# Patient Record
Sex: Female | Born: 1940
Health system: Southern US, Community
[De-identification: ages and names within clinical notes are randomized; demographics above are authoritative.]

## PROBLEM LIST (undated history)

## (undated) DIAGNOSIS — C539 Malignant neoplasm of cervix uteri, unspecified: Secondary | ICD-10-CM

## (undated) DIAGNOSIS — Z923 Personal history of irradiation: Secondary | ICD-10-CM

## (undated) DIAGNOSIS — T7840XA Allergy, unspecified, initial encounter: Secondary | ICD-10-CM

## (undated) DIAGNOSIS — C50919 Malignant neoplasm of unspecified site of unspecified female breast: Secondary | ICD-10-CM

## (undated) DIAGNOSIS — I219 Acute myocardial infarction, unspecified: Secondary | ICD-10-CM

## (undated) DIAGNOSIS — E119 Type 2 diabetes mellitus without complications: Secondary | ICD-10-CM

## (undated) DIAGNOSIS — H269 Unspecified cataract: Secondary | ICD-10-CM

## (undated) DIAGNOSIS — Z8541 Personal history of malignant neoplasm of cervix uteri: Secondary | ICD-10-CM

## (undated) DIAGNOSIS — E785 Hyperlipidemia, unspecified: Secondary | ICD-10-CM

## (undated) DIAGNOSIS — D34 Benign neoplasm of thyroid gland: Secondary | ICD-10-CM

## (undated) DIAGNOSIS — J810 Acute pulmonary edema: Secondary | ICD-10-CM

## (undated) DIAGNOSIS — I10 Essential (primary) hypertension: Secondary | ICD-10-CM

## (undated) DIAGNOSIS — J9601 Acute respiratory failure with hypoxia: Secondary | ICD-10-CM

## (undated) DIAGNOSIS — F32A Depression, unspecified: Secondary | ICD-10-CM

## (undated) DIAGNOSIS — M1990 Unspecified osteoarthritis, unspecified site: Secondary | ICD-10-CM

## (undated) DIAGNOSIS — J869 Pyothorax without fistula: Secondary | ICD-10-CM

## (undated) DIAGNOSIS — F329 Major depressive disorder, single episode, unspecified: Secondary | ICD-10-CM

## (undated) DIAGNOSIS — Z9889 Other specified postprocedural states: Secondary | ICD-10-CM

## (undated) DIAGNOSIS — J948 Other specified pleural conditions: Secondary | ICD-10-CM

## (undated) HISTORY — DX: Unspecified osteoarthritis, unspecified site: M19.90

## (undated) HISTORY — PX: STENT PLACEMENT VASCULAR (ARMC HX): HXRAD1737

## (undated) HISTORY — PX: POLYPECTOMY: SHX149

## (undated) HISTORY — DX: Type 2 diabetes mellitus without complications: E11.9

## (undated) HISTORY — DX: Hyperlipidemia, unspecified: E78.5

## (undated) HISTORY — DX: Benign neoplasm of thyroid gland: D34

## (undated) HISTORY — DX: Malignant neoplasm of unspecified site of unspecified female breast: C50.919

## (undated) HISTORY — PX: OTHER SURGICAL HISTORY: SHX169

## (undated) HISTORY — DX: Unspecified cataract: H26.9

## (undated) HISTORY — DX: Acute respiratory failure with hypoxia: J96.01

## (undated) HISTORY — DX: Essential (primary) hypertension: I10

## (undated) HISTORY — DX: Malignant neoplasm of cervix uteri, unspecified: C53.9

## (undated) HISTORY — DX: Allergy, unspecified, initial encounter: T78.40XA

## (undated) HISTORY — DX: Acute pulmonary edema: J81.0

## (undated) HISTORY — DX: Depression, unspecified: F32.A

## (undated) HISTORY — DX: Acute myocardial infarction, unspecified: I21.9

## (undated) HISTORY — PX: COLONOSCOPY: SHX174

---
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
## 1978-10-13 HISTORY — PX: CERVIX SURGERY: SHX593

## 1998-02-06 ENCOUNTER — Encounter: Admission: RE | Admit: 1998-02-06 | Discharge: 1998-02-06 | Payer: Self-pay | Admitting: Family Medicine

## 1998-08-08 ENCOUNTER — Encounter: Admission: RE | Admit: 1998-08-08 | Discharge: 1998-08-08 | Payer: Self-pay | Admitting: Family Medicine

## 1998-08-21 ENCOUNTER — Encounter: Admission: RE | Admit: 1998-08-21 | Discharge: 1998-08-21 | Payer: Self-pay | Admitting: Family Medicine

## 1998-09-26 ENCOUNTER — Other Ambulatory Visit: Admission: RE | Admit: 1998-09-26 | Discharge: 1998-09-26 | Payer: Self-pay | Admitting: *Deleted

## 1998-09-26 ENCOUNTER — Encounter: Admission: RE | Admit: 1998-09-26 | Discharge: 1998-09-26 | Payer: Self-pay | Admitting: Family Medicine

## 1998-10-16 ENCOUNTER — Ambulatory Visit (HOSPITAL_COMMUNITY): Admission: RE | Admit: 1998-10-16 | Discharge: 1998-10-16 | Payer: Self-pay

## 1998-11-14 ENCOUNTER — Encounter: Admission: RE | Admit: 1998-11-14 | Discharge: 1998-11-14 | Payer: Self-pay | Admitting: Family Medicine

## 1999-02-05 ENCOUNTER — Encounter: Admission: RE | Admit: 1999-02-05 | Discharge: 1999-02-05 | Payer: Self-pay | Admitting: Sports Medicine

## 1999-02-20 ENCOUNTER — Encounter: Admission: RE | Admit: 1999-02-20 | Discharge: 1999-02-20 | Payer: Self-pay | Admitting: Family Medicine

## 1999-04-30 ENCOUNTER — Ambulatory Visit (HOSPITAL_COMMUNITY): Admission: RE | Admit: 1999-04-30 | Discharge: 1999-04-30 | Payer: Self-pay | Admitting: Sports Medicine

## 1999-04-30 ENCOUNTER — Encounter: Admission: RE | Admit: 1999-04-30 | Discharge: 1999-04-30 | Payer: Self-pay | Admitting: Sports Medicine

## 1999-05-14 ENCOUNTER — Encounter: Admission: RE | Admit: 1999-05-14 | Discharge: 1999-05-14 | Payer: Self-pay | Admitting: Family Medicine

## 1999-10-22 ENCOUNTER — Encounter: Admission: RE | Admit: 1999-10-22 | Discharge: 1999-10-22 | Payer: Self-pay | Admitting: Sports Medicine

## 1999-10-31 ENCOUNTER — Encounter: Admission: RE | Admit: 1999-10-31 | Discharge: 1999-10-31 | Payer: Self-pay | Admitting: Family Medicine

## 1999-11-05 ENCOUNTER — Ambulatory Visit (HOSPITAL_COMMUNITY): Admission: RE | Admit: 1999-11-05 | Discharge: 1999-11-05 | Payer: Self-pay | Admitting: *Deleted

## 1999-11-05 ENCOUNTER — Encounter: Admission: RE | Admit: 1999-11-05 | Discharge: 1999-11-05 | Payer: Self-pay | Admitting: Sports Medicine

## 1999-11-07 ENCOUNTER — Ambulatory Visit (HOSPITAL_COMMUNITY): Admission: RE | Admit: 1999-11-07 | Discharge: 1999-11-07 | Payer: Self-pay | Admitting: *Deleted

## 1999-11-12 ENCOUNTER — Encounter: Admission: RE | Admit: 1999-11-12 | Discharge: 1999-11-12 | Payer: Self-pay | Admitting: Sports Medicine

## 1999-12-03 ENCOUNTER — Encounter: Admission: RE | Admit: 1999-12-03 | Discharge: 2000-03-02 | Payer: Self-pay | Admitting: *Deleted

## 1999-12-11 ENCOUNTER — Encounter: Admission: RE | Admit: 1999-12-11 | Discharge: 1999-12-11 | Payer: Self-pay | Admitting: Family Medicine

## 2000-01-01 ENCOUNTER — Encounter: Admission: RE | Admit: 2000-01-01 | Discharge: 2000-01-01 | Payer: Self-pay | Admitting: Family Medicine

## 2000-01-15 ENCOUNTER — Encounter: Admission: RE | Admit: 2000-01-15 | Discharge: 2000-01-15 | Payer: Self-pay | Admitting: Family Medicine

## 2000-01-23 ENCOUNTER — Encounter: Admission: RE | Admit: 2000-01-23 | Discharge: 2000-01-23 | Payer: Self-pay | Admitting: Family Medicine

## 2000-02-12 ENCOUNTER — Encounter: Admission: RE | Admit: 2000-02-12 | Discharge: 2000-02-12 | Payer: Self-pay | Admitting: Family Medicine

## 2000-04-02 ENCOUNTER — Ambulatory Visit (HOSPITAL_COMMUNITY): Admission: RE | Admit: 2000-04-02 | Discharge: 2000-04-02 | Payer: Self-pay | Admitting: Family Medicine

## 2000-04-07 ENCOUNTER — Encounter: Admission: RE | Admit: 2000-04-07 | Discharge: 2000-07-06 | Payer: Self-pay | Admitting: *Deleted

## 2000-05-12 ENCOUNTER — Encounter: Admission: RE | Admit: 2000-05-12 | Discharge: 2000-05-12 | Payer: Self-pay | Admitting: Family Medicine

## 2000-06-30 ENCOUNTER — Encounter: Admission: RE | Admit: 2000-06-30 | Discharge: 2000-06-30 | Payer: Self-pay | Admitting: Family Medicine

## 2000-09-09 ENCOUNTER — Encounter: Admission: RE | Admit: 2000-09-09 | Discharge: 2000-09-09 | Payer: Self-pay | Admitting: Family Medicine

## 2000-10-14 ENCOUNTER — Encounter: Admission: RE | Admit: 2000-10-14 | Discharge: 2000-10-14 | Payer: Self-pay | Admitting: Family Medicine

## 2001-01-19 ENCOUNTER — Encounter: Admission: RE | Admit: 2001-01-19 | Discharge: 2001-01-19 | Payer: Self-pay | Admitting: Family Medicine

## 2001-01-20 ENCOUNTER — Encounter: Admission: RE | Admit: 2001-01-20 | Discharge: 2001-01-20 | Payer: Self-pay | Admitting: Family Medicine

## 2001-02-09 ENCOUNTER — Encounter: Payer: Self-pay | Admitting: Sports Medicine

## 2001-02-09 ENCOUNTER — Encounter: Admission: RE | Admit: 2001-02-09 | Discharge: 2001-02-09 | Payer: Self-pay | Admitting: Sports Medicine

## 2001-02-16 ENCOUNTER — Encounter: Admission: RE | Admit: 2001-02-16 | Discharge: 2001-02-16 | Payer: Self-pay | Admitting: Sports Medicine

## 2001-02-16 ENCOUNTER — Encounter: Payer: Self-pay | Admitting: Sports Medicine

## 2001-03-26 ENCOUNTER — Encounter: Admission: RE | Admit: 2001-03-26 | Discharge: 2001-03-26 | Payer: Self-pay | Admitting: Family Medicine

## 2001-04-26 ENCOUNTER — Encounter: Admission: RE | Admit: 2001-04-26 | Discharge: 2001-04-26 | Payer: Self-pay | Admitting: Family Medicine

## 2001-05-28 ENCOUNTER — Encounter: Admission: RE | Admit: 2001-05-28 | Discharge: 2001-05-28 | Payer: Self-pay | Admitting: Family Medicine

## 2001-08-18 ENCOUNTER — Encounter: Admission: RE | Admit: 2001-08-18 | Discharge: 2001-08-18 | Payer: Self-pay | Admitting: Family Medicine

## 2001-08-31 ENCOUNTER — Encounter: Admission: RE | Admit: 2001-08-31 | Discharge: 2001-08-31 | Payer: Self-pay | Admitting: Family Medicine

## 2001-11-30 ENCOUNTER — Encounter: Admission: RE | Admit: 2001-11-30 | Discharge: 2001-11-30 | Payer: Self-pay | Admitting: Family Medicine

## 2001-11-30 ENCOUNTER — Encounter (INDEPENDENT_AMBULATORY_CARE_PROVIDER_SITE_OTHER): Payer: Self-pay | Admitting: *Deleted

## 2001-12-22 ENCOUNTER — Ambulatory Visit (HOSPITAL_COMMUNITY): Admission: RE | Admit: 2001-12-22 | Discharge: 2001-12-22 | Payer: Self-pay | Admitting: Family Medicine

## 2001-12-22 ENCOUNTER — Encounter: Admission: RE | Admit: 2001-12-22 | Discharge: 2001-12-22 | Payer: Self-pay | Admitting: Family Medicine

## 2002-01-11 ENCOUNTER — Ambulatory Visit (HOSPITAL_COMMUNITY): Admission: RE | Admit: 2002-01-11 | Discharge: 2002-01-11 | Payer: Self-pay | Admitting: Sports Medicine

## 2002-01-26 ENCOUNTER — Encounter: Admission: RE | Admit: 2002-01-26 | Discharge: 2002-01-26 | Payer: Self-pay | Admitting: Family Medicine

## 2002-01-27 ENCOUNTER — Ambulatory Visit (HOSPITAL_COMMUNITY): Admission: RE | Admit: 2002-01-27 | Discharge: 2002-01-27 | Payer: Self-pay | Admitting: Family Medicine

## 2002-02-01 ENCOUNTER — Encounter: Admission: RE | Admit: 2002-02-01 | Discharge: 2002-02-01 | Payer: Self-pay | Admitting: Sports Medicine

## 2002-02-18 ENCOUNTER — Encounter: Admission: RE | Admit: 2002-02-18 | Discharge: 2002-02-18 | Payer: Self-pay | Admitting: Family Medicine

## 2002-03-08 ENCOUNTER — Encounter: Payer: Self-pay | Admitting: Sports Medicine

## 2002-03-08 ENCOUNTER — Encounter: Admission: RE | Admit: 2002-03-08 | Discharge: 2002-03-08 | Payer: Self-pay | Admitting: Sports Medicine

## 2002-03-22 ENCOUNTER — Encounter: Admission: RE | Admit: 2002-03-22 | Discharge: 2002-03-22 | Payer: Self-pay | Admitting: Family Medicine

## 2002-04-13 ENCOUNTER — Encounter: Admission: RE | Admit: 2002-04-13 | Discharge: 2002-04-13 | Payer: Self-pay | Admitting: Family Medicine

## 2002-05-03 ENCOUNTER — Encounter: Admission: RE | Admit: 2002-05-03 | Discharge: 2002-05-03 | Payer: Self-pay | Admitting: Family Medicine

## 2002-05-30 ENCOUNTER — Encounter: Admission: RE | Admit: 2002-05-30 | Discharge: 2002-05-30 | Payer: Self-pay | Admitting: Family Medicine

## 2002-06-14 ENCOUNTER — Encounter: Admission: RE | Admit: 2002-06-14 | Discharge: 2002-06-14 | Payer: Self-pay | Admitting: Family Medicine

## 2002-07-07 ENCOUNTER — Encounter: Admission: RE | Admit: 2002-07-07 | Discharge: 2002-07-07 | Payer: Self-pay | Admitting: Family Medicine

## 2002-08-23 ENCOUNTER — Encounter: Admission: RE | Admit: 2002-08-23 | Discharge: 2002-08-23 | Payer: Self-pay | Admitting: Family Medicine

## 2002-10-13 DIAGNOSIS — I219 Acute myocardial infarction, unspecified: Secondary | ICD-10-CM

## 2002-10-13 HISTORY — DX: Acute myocardial infarction, unspecified: I21.9

## 2002-11-15 ENCOUNTER — Encounter: Admission: RE | Admit: 2002-11-15 | Discharge: 2002-11-15 | Payer: Self-pay | Admitting: Family Medicine

## 2002-11-24 ENCOUNTER — Inpatient Hospital Stay (HOSPITAL_COMMUNITY): Admission: EM | Admit: 2002-11-24 | Discharge: 2002-11-30 | Payer: Self-pay | Admitting: Emergency Medicine

## 2002-11-24 ENCOUNTER — Encounter: Payer: Self-pay | Admitting: Emergency Medicine

## 2002-11-25 ENCOUNTER — Encounter: Payer: Self-pay | Admitting: Cardiology

## 2002-11-28 ENCOUNTER — Encounter: Payer: Self-pay | Admitting: Cardiology

## 2002-12-21 ENCOUNTER — Encounter: Admission: RE | Admit: 2002-12-21 | Discharge: 2002-12-21 | Payer: Self-pay | Admitting: Family Medicine

## 2003-01-23 ENCOUNTER — Encounter: Admission: RE | Admit: 2003-01-23 | Discharge: 2003-01-23 | Payer: Self-pay | Admitting: Family Medicine

## 2003-01-23 ENCOUNTER — Encounter (INDEPENDENT_AMBULATORY_CARE_PROVIDER_SITE_OTHER): Payer: Self-pay | Admitting: *Deleted

## 2003-01-30 ENCOUNTER — Encounter (HOSPITAL_COMMUNITY): Admission: RE | Admit: 2003-01-30 | Discharge: 2003-04-30 | Payer: Self-pay | Admitting: Sports Medicine

## 2003-01-31 ENCOUNTER — Encounter: Payer: Self-pay | Admitting: Sports Medicine

## 2003-02-15 ENCOUNTER — Ambulatory Visit (HOSPITAL_COMMUNITY): Admission: RE | Admit: 2003-02-15 | Discharge: 2003-02-15 | Payer: Self-pay | Admitting: General Surgery

## 2003-02-15 ENCOUNTER — Encounter: Payer: Self-pay | Admitting: General Surgery

## 2003-02-28 ENCOUNTER — Encounter (INDEPENDENT_AMBULATORY_CARE_PROVIDER_SITE_OTHER): Payer: Self-pay | Admitting: Specialist

## 2003-02-28 ENCOUNTER — Encounter: Payer: Self-pay | Admitting: General Surgery

## 2003-02-28 ENCOUNTER — Ambulatory Visit (HOSPITAL_COMMUNITY): Admission: RE | Admit: 2003-02-28 | Discharge: 2003-02-28 | Payer: Self-pay | Admitting: General Surgery

## 2003-03-14 ENCOUNTER — Encounter: Admission: RE | Admit: 2003-03-14 | Discharge: 2003-03-14 | Payer: Self-pay | Admitting: Sports Medicine

## 2003-03-14 ENCOUNTER — Encounter: Payer: Self-pay | Admitting: Sports Medicine

## 2003-03-14 DIAGNOSIS — D34 Benign neoplasm of thyroid gland: Secondary | ICD-10-CM

## 2003-03-14 HISTORY — PX: THYROIDECTOMY: SHX17

## 2003-03-14 HISTORY — DX: Benign neoplasm of thyroid gland: D34

## 2003-03-31 ENCOUNTER — Encounter: Payer: Self-pay | Admitting: General Surgery

## 2003-04-07 ENCOUNTER — Observation Stay (HOSPITAL_COMMUNITY): Admission: RE | Admit: 2003-04-07 | Discharge: 2003-04-08 | Payer: Self-pay | Admitting: General Surgery

## 2003-04-07 ENCOUNTER — Encounter (INDEPENDENT_AMBULATORY_CARE_PROVIDER_SITE_OTHER): Payer: Self-pay | Admitting: *Deleted

## 2003-04-13 ENCOUNTER — Encounter: Admission: RE | Admit: 2003-04-13 | Discharge: 2003-04-13 | Payer: Self-pay | Admitting: Family Medicine

## 2003-04-21 ENCOUNTER — Encounter: Admission: RE | Admit: 2003-04-21 | Discharge: 2003-04-21 | Payer: Self-pay | Admitting: Sports Medicine

## 2003-06-01 ENCOUNTER — Encounter: Admission: RE | Admit: 2003-06-01 | Discharge: 2003-06-01 | Payer: Self-pay | Admitting: Family Medicine

## 2003-06-23 ENCOUNTER — Encounter: Admission: RE | Admit: 2003-06-23 | Discharge: 2003-06-23 | Payer: Self-pay | Admitting: Family Medicine

## 2003-06-26 ENCOUNTER — Encounter: Admission: RE | Admit: 2003-06-26 | Discharge: 2003-06-26 | Payer: Self-pay | Admitting: Sports Medicine

## 2003-06-26 ENCOUNTER — Encounter: Payer: Self-pay | Admitting: Sports Medicine

## 2003-07-27 ENCOUNTER — Encounter: Admission: RE | Admit: 2003-07-27 | Discharge: 2003-07-27 | Payer: Self-pay | Admitting: Sports Medicine

## 2003-08-01 ENCOUNTER — Encounter: Admission: RE | Admit: 2003-08-01 | Discharge: 2003-08-01 | Payer: Self-pay | Admitting: Family Medicine

## 2003-08-03 ENCOUNTER — Encounter: Admission: RE | Admit: 2003-08-03 | Discharge: 2003-08-03 | Payer: Self-pay | Admitting: Sports Medicine

## 2004-02-20 ENCOUNTER — Encounter: Admission: RE | Admit: 2004-02-20 | Discharge: 2004-02-20 | Payer: Self-pay | Admitting: Sports Medicine

## 2004-04-02 ENCOUNTER — Encounter (INDEPENDENT_AMBULATORY_CARE_PROVIDER_SITE_OTHER): Payer: Self-pay | Admitting: *Deleted

## 2004-04-02 ENCOUNTER — Encounter: Admission: RE | Admit: 2004-04-02 | Discharge: 2004-04-02 | Payer: Self-pay | Admitting: Family Medicine

## 2004-04-04 ENCOUNTER — Encounter: Admission: RE | Admit: 2004-04-04 | Discharge: 2004-04-04 | Payer: Self-pay | Admitting: Sports Medicine

## 2004-07-09 ENCOUNTER — Ambulatory Visit: Payer: Self-pay | Admitting: Family Medicine

## 2004-07-30 ENCOUNTER — Ambulatory Visit: Payer: Self-pay | Admitting: Family Medicine

## 2004-08-20 ENCOUNTER — Ambulatory Visit (HOSPITAL_COMMUNITY): Admission: RE | Admit: 2004-08-20 | Discharge: 2004-08-20 | Payer: Self-pay | Admitting: Cardiology

## 2004-10-01 ENCOUNTER — Ambulatory Visit: Payer: Self-pay | Admitting: Family Medicine

## 2004-12-16 ENCOUNTER — Ambulatory Visit: Payer: Self-pay | Admitting: Sports Medicine

## 2005-04-11 ENCOUNTER — Ambulatory Visit: Payer: Self-pay | Admitting: Family Medicine

## 2005-04-21 ENCOUNTER — Ambulatory Visit: Payer: Self-pay | Admitting: Family Medicine

## 2005-04-29 ENCOUNTER — Ambulatory Visit: Payer: Self-pay | Admitting: Family Medicine

## 2005-08-01 ENCOUNTER — Ambulatory Visit: Payer: Self-pay | Admitting: Family Medicine

## 2005-12-02 ENCOUNTER — Ambulatory Visit: Payer: Self-pay | Admitting: Sports Medicine

## 2005-12-08 ENCOUNTER — Ambulatory Visit: Payer: Self-pay | Admitting: Family Medicine

## 2005-12-17 ENCOUNTER — Encounter: Admission: RE | Admit: 2005-12-17 | Discharge: 2005-12-17 | Payer: Self-pay | Admitting: Sports Medicine

## 2006-02-16 ENCOUNTER — Encounter (INDEPENDENT_AMBULATORY_CARE_PROVIDER_SITE_OTHER): Payer: Self-pay | Admitting: *Deleted

## 2006-03-02 ENCOUNTER — Ambulatory Visit: Payer: Self-pay | Admitting: Sports Medicine

## 2006-03-12 ENCOUNTER — Ambulatory Visit: Payer: Self-pay | Admitting: Family Medicine

## 2006-04-02 ENCOUNTER — Ambulatory Visit: Payer: Self-pay | Admitting: Family Medicine

## 2006-05-28 ENCOUNTER — Ambulatory Visit: Payer: Self-pay | Admitting: Family Medicine

## 2006-06-25 ENCOUNTER — Ambulatory Visit: Payer: Self-pay | Admitting: Family Medicine

## 2006-06-29 ENCOUNTER — Ambulatory Visit: Payer: Self-pay | Admitting: Sports Medicine

## 2006-07-20 ENCOUNTER — Ambulatory Visit: Payer: Self-pay | Admitting: Family Medicine

## 2006-07-23 ENCOUNTER — Ambulatory Visit: Payer: Self-pay | Admitting: Family Medicine

## 2006-12-10 DIAGNOSIS — M899 Disorder of bone, unspecified: Secondary | ICD-10-CM

## 2006-12-10 DIAGNOSIS — J309 Allergic rhinitis, unspecified: Secondary | ICD-10-CM | POA: Insufficient documentation

## 2006-12-10 DIAGNOSIS — N3946 Mixed incontinence: Secondary | ICD-10-CM | POA: Insufficient documentation

## 2006-12-10 DIAGNOSIS — M8580 Other specified disorders of bone density and structure, unspecified site: Secondary | ICD-10-CM

## 2006-12-10 DIAGNOSIS — K5730 Diverticulosis of large intestine without perforation or abscess without bleeding: Secondary | ICD-10-CM | POA: Insufficient documentation

## 2006-12-10 DIAGNOSIS — M949 Disorder of cartilage, unspecified: Secondary | ICD-10-CM

## 2006-12-10 HISTORY — DX: Other specified disorders of bone density and structure, unspecified site: M85.80

## 2006-12-11 ENCOUNTER — Encounter (INDEPENDENT_AMBULATORY_CARE_PROVIDER_SITE_OTHER): Payer: Self-pay | Admitting: *Deleted

## 2007-01-12 ENCOUNTER — Encounter: Admission: RE | Admit: 2007-01-12 | Discharge: 2007-01-12 | Payer: Self-pay | Admitting: Internal Medicine

## 2007-01-20 DIAGNOSIS — E785 Hyperlipidemia, unspecified: Secondary | ICD-10-CM | POA: Insufficient documentation

## 2007-01-20 DIAGNOSIS — E119 Type 2 diabetes mellitus without complications: Secondary | ICD-10-CM | POA: Insufficient documentation

## 2007-01-20 DIAGNOSIS — I10 Essential (primary) hypertension: Secondary | ICD-10-CM | POA: Insufficient documentation

## 2007-01-20 DIAGNOSIS — I2510 Atherosclerotic heart disease of native coronary artery without angina pectoris: Secondary | ICD-10-CM | POA: Insufficient documentation

## 2007-01-22 ENCOUNTER — Ambulatory Visit: Payer: Self-pay | Admitting: Family Medicine

## 2007-01-22 LAB — CONVERTED CEMR LAB: Hgb A1c MFr Bld: 6.5 %

## 2007-02-23 ENCOUNTER — Ambulatory Visit: Payer: Self-pay | Admitting: Family Medicine

## 2007-02-24 ENCOUNTER — Encounter (INDEPENDENT_AMBULATORY_CARE_PROVIDER_SITE_OTHER): Payer: Self-pay | Admitting: Family Medicine

## 2007-02-24 DIAGNOSIS — Z8541 Personal history of malignant neoplasm of cervix uteri: Secondary | ICD-10-CM | POA: Insufficient documentation

## 2007-03-26 ENCOUNTER — Encounter (INDEPENDENT_AMBULATORY_CARE_PROVIDER_SITE_OTHER): Payer: Self-pay | Admitting: Family Medicine

## 2007-03-27 ENCOUNTER — Encounter (INDEPENDENT_AMBULATORY_CARE_PROVIDER_SITE_OTHER): Payer: Self-pay | Admitting: Family Medicine

## 2007-10-14 DIAGNOSIS — Z923 Personal history of irradiation: Secondary | ICD-10-CM

## 2007-10-14 HISTORY — PX: BREAST SURGERY: SHX581

## 2007-10-14 HISTORY — PX: BREAST LUMPECTOMY: SHX2

## 2007-10-14 HISTORY — DX: Personal history of irradiation: Z92.3

## 2008-02-08 ENCOUNTER — Encounter (INDEPENDENT_AMBULATORY_CARE_PROVIDER_SITE_OTHER): Payer: Self-pay | Admitting: Family Medicine

## 2008-02-15 ENCOUNTER — Encounter (INDEPENDENT_AMBULATORY_CARE_PROVIDER_SITE_OTHER): Payer: Self-pay | Admitting: Family Medicine

## 2008-02-15 ENCOUNTER — Ambulatory Visit: Payer: Self-pay | Admitting: Family Medicine

## 2008-02-15 DIAGNOSIS — M461 Sacroiliitis, not elsewhere classified: Secondary | ICD-10-CM | POA: Insufficient documentation

## 2008-02-15 LAB — CONVERTED CEMR LAB
ALT: 12 units/L (ref 0–35)
CO2: 28 meq/L (ref 19–32)
Calcium: 9.2 mg/dL (ref 8.4–10.5)
Chloride: 103 meq/L (ref 96–112)
Sodium: 140 meq/L (ref 135–145)
Total Protein: 7.5 g/dL (ref 6.0–8.3)

## 2008-02-17 ENCOUNTER — Encounter (INDEPENDENT_AMBULATORY_CARE_PROVIDER_SITE_OTHER): Payer: Self-pay | Admitting: Family Medicine

## 2008-04-12 DIAGNOSIS — C50919 Malignant neoplasm of unspecified site of unspecified female breast: Secondary | ICD-10-CM

## 2008-04-12 HISTORY — DX: Malignant neoplasm of unspecified site of unspecified female breast: C50.919

## 2008-04-17 ENCOUNTER — Encounter (INDEPENDENT_AMBULATORY_CARE_PROVIDER_SITE_OTHER): Payer: Self-pay | Admitting: Family Medicine

## 2008-04-17 ENCOUNTER — Ambulatory Visit: Payer: Self-pay | Admitting: Family Medicine

## 2008-04-18 ENCOUNTER — Encounter (INDEPENDENT_AMBULATORY_CARE_PROVIDER_SITE_OTHER): Payer: Self-pay | Admitting: Family Medicine

## 2008-04-18 ENCOUNTER — Encounter: Admission: RE | Admit: 2008-04-18 | Discharge: 2008-04-18 | Payer: Self-pay | Admitting: Family Medicine

## 2008-04-18 ENCOUNTER — Ambulatory Visit: Payer: Self-pay | Admitting: Family Medicine

## 2008-04-18 DIAGNOSIS — M25559 Pain in unspecified hip: Secondary | ICD-10-CM | POA: Insufficient documentation

## 2008-04-18 DIAGNOSIS — Z780 Asymptomatic menopausal state: Secondary | ICD-10-CM | POA: Insufficient documentation

## 2008-04-18 LAB — CONVERTED CEMR LAB: Pap Smear: NORMAL

## 2008-04-19 LAB — CONVERTED CEMR LAB: Cholesterol: 180 mg/dL (ref 0–200)

## 2008-04-21 LAB — CONVERTED CEMR LAB: Pap Smear: NORMAL

## 2008-05-02 ENCOUNTER — Encounter: Admission: RE | Admit: 2008-05-02 | Discharge: 2008-05-02 | Payer: Self-pay | Admitting: Family Medicine

## 2008-05-09 ENCOUNTER — Encounter (INDEPENDENT_AMBULATORY_CARE_PROVIDER_SITE_OTHER): Payer: Self-pay | Admitting: Family Medicine

## 2008-05-09 ENCOUNTER — Encounter: Admission: RE | Admit: 2008-05-09 | Discharge: 2008-05-09 | Payer: Self-pay | Admitting: Family Medicine

## 2008-05-09 ENCOUNTER — Encounter (INDEPENDENT_AMBULATORY_CARE_PROVIDER_SITE_OTHER): Payer: Self-pay | Admitting: Diagnostic Radiology

## 2008-05-09 ENCOUNTER — Telehealth (INDEPENDENT_AMBULATORY_CARE_PROVIDER_SITE_OTHER): Payer: Self-pay | Admitting: Family Medicine

## 2008-05-18 ENCOUNTER — Encounter: Admission: RE | Admit: 2008-05-18 | Discharge: 2008-05-18 | Payer: Self-pay | Admitting: Family Medicine

## 2008-06-09 ENCOUNTER — Encounter: Admission: RE | Admit: 2008-06-09 | Discharge: 2008-06-09 | Payer: Self-pay | Admitting: General Surgery

## 2008-06-12 ENCOUNTER — Encounter (INDEPENDENT_AMBULATORY_CARE_PROVIDER_SITE_OTHER): Payer: Self-pay | Admitting: Family Medicine

## 2008-06-12 ENCOUNTER — Encounter (INDEPENDENT_AMBULATORY_CARE_PROVIDER_SITE_OTHER): Payer: Self-pay | Admitting: General Surgery

## 2008-06-12 ENCOUNTER — Encounter: Admission: RE | Admit: 2008-06-12 | Discharge: 2008-06-12 | Payer: Self-pay | Admitting: General Surgery

## 2008-06-12 ENCOUNTER — Ambulatory Visit (HOSPITAL_BASED_OUTPATIENT_CLINIC_OR_DEPARTMENT_OTHER): Admission: RE | Admit: 2008-06-12 | Discharge: 2008-06-12 | Payer: Self-pay | Admitting: General Surgery

## 2008-06-14 ENCOUNTER — Ambulatory Visit: Payer: Self-pay | Admitting: Oncology

## 2008-07-04 ENCOUNTER — Ambulatory Visit (HOSPITAL_COMMUNITY): Admission: RE | Admit: 2008-07-04 | Discharge: 2008-07-04 | Payer: Self-pay | Admitting: Oncology

## 2008-07-06 ENCOUNTER — Ambulatory Visit: Admission: RE | Admit: 2008-07-06 | Discharge: 2008-10-03 | Payer: Self-pay | Admitting: Radiation Oncology

## 2008-07-07 ENCOUNTER — Encounter (INDEPENDENT_AMBULATORY_CARE_PROVIDER_SITE_OTHER): Payer: Self-pay | Admitting: Family Medicine

## 2008-07-16 ENCOUNTER — Encounter (INDEPENDENT_AMBULATORY_CARE_PROVIDER_SITE_OTHER): Payer: Self-pay | Admitting: Family Medicine

## 2008-07-17 ENCOUNTER — Ambulatory Visit (HOSPITAL_COMMUNITY): Admission: RE | Admit: 2008-07-17 | Discharge: 2008-07-17 | Payer: Self-pay | Admitting: Oncology

## 2008-07-24 ENCOUNTER — Ambulatory Visit: Payer: Self-pay | Admitting: Family Medicine

## 2008-07-28 ENCOUNTER — Ambulatory Visit (HOSPITAL_COMMUNITY): Admission: RE | Admit: 2008-07-28 | Discharge: 2008-07-28 | Payer: Self-pay | Admitting: Oncology

## 2008-07-28 LAB — CBC WITH DIFFERENTIAL/PLATELET
Eosinophils Absolute: 0.1 10*3/uL (ref 0.0–0.5)
HCT: 38.2 % (ref 34.8–46.6)
LYMPH%: 20.5 % (ref 14.0–48.0)
MONO#: 0.6 10*3/uL (ref 0.1–0.9)
NEUT#: 4.5 10*3/uL (ref 1.5–6.5)
NEUT%: 69.2 % (ref 39.6–76.8)
Platelets: 206 10*3/uL (ref 145–400)
WBC: 6.6 10*3/uL (ref 3.9–10.0)

## 2008-07-28 LAB — LACTATE DEHYDROGENASE: LDH: 154 U/L (ref 94–250)

## 2008-07-28 LAB — COMPREHENSIVE METABOLIC PANEL
CO2: 30 mEq/L (ref 19–32)
Creatinine, Ser: 0.92 mg/dL (ref 0.40–1.20)
Glucose, Bld: 105 mg/dL — ABNORMAL HIGH (ref 70–99)
Total Bilirubin: 0.4 mg/dL (ref 0.3–1.2)

## 2008-08-24 ENCOUNTER — Encounter (INDEPENDENT_AMBULATORY_CARE_PROVIDER_SITE_OTHER): Payer: Self-pay | Admitting: Family Medicine

## 2008-08-24 ENCOUNTER — Ambulatory Visit: Payer: Self-pay | Admitting: Family Medicine

## 2008-08-24 DIAGNOSIS — C50919 Malignant neoplasm of unspecified site of unspecified female breast: Secondary | ICD-10-CM | POA: Insufficient documentation

## 2008-08-24 LAB — CONVERTED CEMR LAB: Hgb A1c MFr Bld: 6.5 %

## 2008-08-25 LAB — CONVERTED CEMR LAB
ALT: 12 units/L (ref 0–35)
Albumin: 4.1 g/dL (ref 3.5–5.2)
Alkaline Phosphatase: 88 units/L (ref 39–117)
CO2: 26 meq/L (ref 19–32)
Glucose, Bld: 112 mg/dL — ABNORMAL HIGH (ref 70–99)
MCHC: 31.8 g/dL (ref 30.0–36.0)
Potassium: 4.8 meq/L (ref 3.5–5.3)
RBC: 4.3 M/uL (ref 3.87–5.11)
Sodium: 141 meq/L (ref 135–145)
Total Protein: 7.4 g/dL (ref 6.0–8.3)
Vit D, 1,25-Dihydroxy: 13 — ABNORMAL LOW (ref 30–89)
WBC: 4.6 10*3/uL (ref 4.0–10.5)

## 2008-09-19 ENCOUNTER — Encounter (INDEPENDENT_AMBULATORY_CARE_PROVIDER_SITE_OTHER): Payer: Self-pay | Admitting: Family Medicine

## 2008-10-03 ENCOUNTER — Ambulatory Visit: Payer: Self-pay | Admitting: Oncology

## 2008-10-03 LAB — CBC WITH DIFFERENTIAL/PLATELET
BASO%: 0.5 % (ref 0.0–2.0)
HCT: 36.9 % (ref 34.8–46.6)
LYMPH%: 23.1 % (ref 14.0–48.0)
MCHC: 33.4 g/dL (ref 32.0–36.0)
MCV: 90.4 fL (ref 81.0–101.0)
MONO#: 0.5 10*3/uL (ref 0.1–0.9)
NEUT%: 64.4 % (ref 39.6–76.8)
Platelets: 193 10*3/uL (ref 145–400)
WBC: 5.2 10*3/uL (ref 3.9–10.0)

## 2008-10-04 LAB — COMPREHENSIVE METABOLIC PANEL
AST: 17 U/L (ref 0–37)
Albumin: 3.9 g/dL (ref 3.5–5.2)
Alkaline Phosphatase: 83 U/L (ref 39–117)
BUN: 12 mg/dL (ref 6–23)
Creatinine, Ser: 0.84 mg/dL (ref 0.40–1.20)
Potassium: 4.2 mEq/L (ref 3.5–5.3)

## 2008-10-17 ENCOUNTER — Encounter (INDEPENDENT_AMBULATORY_CARE_PROVIDER_SITE_OTHER): Payer: Self-pay | Admitting: Family Medicine

## 2008-10-23 ENCOUNTER — Telehealth (INDEPENDENT_AMBULATORY_CARE_PROVIDER_SITE_OTHER): Payer: Self-pay | Admitting: *Deleted

## 2008-12-14 ENCOUNTER — Encounter (INDEPENDENT_AMBULATORY_CARE_PROVIDER_SITE_OTHER): Payer: Self-pay | Admitting: Family Medicine

## 2008-12-19 ENCOUNTER — Ambulatory Visit: Payer: Self-pay | Admitting: Family Medicine

## 2008-12-26 ENCOUNTER — Ambulatory Visit: Payer: Self-pay | Admitting: Oncology

## 2008-12-28 LAB — CBC WITH DIFFERENTIAL/PLATELET
BASO%: 0.6 % (ref 0.0–2.0)
EOS%: 1.6 % (ref 0.0–7.0)
MCH: 30.2 pg (ref 25.1–34.0)
MCHC: 33.4 g/dL (ref 31.5–36.0)
MCV: 90.3 fL (ref 79.5–101.0)
MONO%: 9.2 % (ref 0.0–14.0)
RBC: 3.95 10*6/uL (ref 3.70–5.45)
RDW: 14.1 % (ref 11.2–14.5)
lymph#: 1.4 10*3/uL (ref 0.9–3.3)

## 2008-12-29 LAB — COMPREHENSIVE METABOLIC PANEL
ALT: 12 U/L (ref 0–35)
AST: 17 U/L (ref 0–37)
Albumin: 4.1 g/dL (ref 3.5–5.2)
Alkaline Phosphatase: 86 U/L (ref 39–117)
BUN: 14 mg/dL (ref 6–23)
Potassium: 3.9 mEq/L (ref 3.5–5.3)
Sodium: 138 mEq/L (ref 135–145)
Total Protein: 7.5 g/dL (ref 6.0–8.3)

## 2009-02-12 ENCOUNTER — Ambulatory Visit: Payer: Self-pay | Admitting: Family Medicine

## 2009-02-12 ENCOUNTER — Telehealth (INDEPENDENT_AMBULATORY_CARE_PROVIDER_SITE_OTHER): Payer: Self-pay | Admitting: Family Medicine

## 2009-02-16 ENCOUNTER — Emergency Department (HOSPITAL_COMMUNITY): Admission: EM | Admit: 2009-02-16 | Discharge: 2009-02-16 | Payer: Self-pay | Admitting: Family Medicine

## 2009-05-03 ENCOUNTER — Encounter: Admission: RE | Admit: 2009-05-03 | Discharge: 2009-05-03 | Payer: Self-pay | Admitting: Radiation Oncology

## 2009-05-15 ENCOUNTER — Encounter: Payer: Self-pay | Admitting: Family Medicine

## 2009-06-08 ENCOUNTER — Ambulatory Visit: Payer: Self-pay | Admitting: Family Medicine

## 2009-06-08 ENCOUNTER — Encounter: Payer: Self-pay | Admitting: Family Medicine

## 2009-06-08 LAB — CONVERTED CEMR LAB
Alkaline Phosphatase: 86 units/L (ref 39–117)
BUN: 18 mg/dL (ref 6–23)
Creatinine, Ser: 0.95 mg/dL (ref 0.40–1.20)
Direct LDL: 134 mg/dL — ABNORMAL HIGH
Glucose, Bld: 169 mg/dL — ABNORMAL HIGH (ref 70–99)
HCT: 35.8 % — ABNORMAL LOW (ref 36.0–46.0)
Hemoglobin: 11.7 g/dL — ABNORMAL LOW (ref 12.0–15.0)
MCHC: 32.7 g/dL (ref 30.0–36.0)
MCV: 88 fL (ref 78.0–100.0)
RBC: 4.07 M/uL (ref 3.87–5.11)
Sodium: 142 meq/L (ref 135–145)
TSH: 0.641 microintl units/mL (ref 0.350–4.500)
Total Bilirubin: 0.2 mg/dL — ABNORMAL LOW (ref 0.3–1.2)

## 2009-06-14 ENCOUNTER — Telehealth: Payer: Self-pay | Admitting: Family Medicine

## 2009-06-20 ENCOUNTER — Ambulatory Visit: Payer: Self-pay | Admitting: Gastroenterology

## 2009-06-26 ENCOUNTER — Encounter: Payer: Self-pay | Admitting: Gastroenterology

## 2009-06-26 ENCOUNTER — Ambulatory Visit: Payer: Self-pay | Admitting: Gastroenterology

## 2009-06-26 ENCOUNTER — Encounter: Payer: Self-pay | Admitting: Family Medicine

## 2009-06-27 ENCOUNTER — Encounter (INDEPENDENT_AMBULATORY_CARE_PROVIDER_SITE_OTHER): Payer: Self-pay | Admitting: Family Medicine

## 2009-06-27 ENCOUNTER — Ambulatory Visit: Payer: Self-pay | Admitting: Family Medicine

## 2009-06-28 ENCOUNTER — Encounter: Payer: Self-pay | Admitting: Gastroenterology

## 2009-06-28 ENCOUNTER — Encounter: Payer: Self-pay | Admitting: Family Medicine

## 2009-07-10 ENCOUNTER — Ambulatory Visit: Payer: Self-pay | Admitting: Oncology

## 2009-07-12 LAB — CBC WITH DIFFERENTIAL/PLATELET
BASO%: 0.3 % (ref 0.0–2.0)
Basophils Absolute: 0 10*3/uL (ref 0.0–0.1)
EOS%: 0.9 % (ref 0.0–7.0)
MCH: 29 pg (ref 25.1–34.0)
MCHC: 32.6 g/dL (ref 31.5–36.0)
MCV: 89 fL (ref 79.5–101.0)
MONO%: 6.9 % (ref 0.0–14.0)
RBC: 3.83 10*6/uL (ref 3.70–5.45)
RDW: 14 % (ref 11.2–14.5)
nRBC: 0 % (ref 0–0)

## 2009-07-13 LAB — CANCER ANTIGEN 27.29: CA 27.29: 20 U/mL (ref 0–39)

## 2009-07-13 LAB — COMPREHENSIVE METABOLIC PANEL
Alkaline Phosphatase: 83 U/L (ref 39–117)
Glucose, Bld: 104 mg/dL — ABNORMAL HIGH (ref 70–99)
Sodium: 142 mEq/L (ref 135–145)
Total Bilirubin: 0.2 mg/dL — ABNORMAL LOW (ref 0.3–1.2)
Total Protein: 7 g/dL (ref 6.0–8.3)

## 2009-08-06 ENCOUNTER — Encounter: Payer: Self-pay | Admitting: Family Medicine

## 2009-08-24 ENCOUNTER — Ambulatory Visit: Payer: Self-pay | Admitting: Family Medicine

## 2009-08-28 ENCOUNTER — Ambulatory Visit: Payer: Self-pay | Admitting: Oncology

## 2009-08-31 ENCOUNTER — Telehealth: Payer: Self-pay | Admitting: *Deleted

## 2009-12-14 ENCOUNTER — Encounter: Payer: Self-pay | Admitting: Family Medicine

## 2010-01-11 ENCOUNTER — Encounter: Payer: Self-pay | Admitting: Family Medicine

## 2010-01-15 ENCOUNTER — Ambulatory Visit: Payer: Self-pay | Admitting: Oncology

## 2010-01-17 LAB — COMPREHENSIVE METABOLIC PANEL
ALT: 14 U/L (ref 0–35)
Albumin: 4.1 g/dL (ref 3.5–5.2)
Alkaline Phosphatase: 73 U/L (ref 39–117)
Glucose, Bld: 152 mg/dL — ABNORMAL HIGH (ref 70–99)
Potassium: 3.9 mEq/L (ref 3.5–5.3)
Sodium: 137 mEq/L (ref 135–145)
Total Bilirubin: 0.3 mg/dL (ref 0.3–1.2)
Total Protein: 6.7 g/dL (ref 6.0–8.3)

## 2010-01-17 LAB — CBC WITH DIFFERENTIAL/PLATELET
BASO%: 0.1 % (ref 0.0–2.0)
Basophils Absolute: 0 10*3/uL (ref 0.0–0.1)
EOS%: 2 % (ref 0.0–7.0)
HCT: 35.1 % (ref 34.8–46.6)
HGB: 11.4 g/dL — ABNORMAL LOW (ref 11.6–15.9)
MCHC: 32.5 g/dL (ref 31.5–36.0)
MONO#: 0.7 10*3/uL (ref 0.1–0.9)
NEUT%: 56.6 % (ref 38.4–76.8)
RDW: 13.8 % (ref 11.2–14.5)
WBC: 7 10*3/uL (ref 3.9–10.3)
lymph#: 2.2 10*3/uL (ref 0.9–3.3)

## 2010-01-17 LAB — LACTATE DEHYDROGENASE: LDH: 163 U/L (ref 94–250)

## 2010-01-17 LAB — VITAMIN D 25 HYDROXY (VIT D DEFICIENCY, FRACTURES): Vit D, 25-Hydroxy: 43 ng/mL (ref 30–89)

## 2010-01-22 ENCOUNTER — Encounter: Payer: Self-pay | Admitting: Family Medicine

## 2010-02-14 ENCOUNTER — Ambulatory Visit: Payer: Self-pay | Admitting: Family Medicine

## 2010-02-14 ENCOUNTER — Encounter: Payer: Self-pay | Admitting: Family Medicine

## 2010-02-14 DIAGNOSIS — M6530 Trigger finger, unspecified finger: Secondary | ICD-10-CM | POA: Insufficient documentation

## 2010-02-14 LAB — CONVERTED CEMR LAB
Direct LDL: 67 mg/dL
Hgb A1c MFr Bld: 8 %

## 2010-02-17 ENCOUNTER — Encounter: Payer: Self-pay | Admitting: Family Medicine

## 2010-02-19 ENCOUNTER — Ambulatory Visit: Payer: Self-pay | Admitting: Family Medicine

## 2010-03-01 ENCOUNTER — Telehealth: Payer: Self-pay | Admitting: Family Medicine

## 2010-03-18 ENCOUNTER — Ambulatory Visit: Payer: Self-pay | Admitting: Family Medicine

## 2010-03-18 LAB — CONVERTED CEMR LAB: Rapid Strep: NEGATIVE

## 2010-03-28 ENCOUNTER — Ambulatory Visit: Payer: Self-pay | Admitting: Family Medicine

## 2010-04-09 ENCOUNTER — Encounter: Payer: Self-pay | Admitting: Family Medicine

## 2010-04-09 ENCOUNTER — Ambulatory Visit: Payer: Self-pay | Admitting: Family Medicine

## 2010-04-10 ENCOUNTER — Encounter: Payer: Self-pay | Admitting: Family Medicine

## 2010-04-10 LAB — CONVERTED CEMR LAB
ALT: 13 units/L (ref 0–35)
Alkaline Phosphatase: 72 units/L (ref 39–117)
Creatinine, Ser: 0.92 mg/dL (ref 0.40–1.20)
Sodium: 141 meq/L (ref 135–145)
Total Bilirubin: 0.2 mg/dL — ABNORMAL LOW (ref 0.3–1.2)
Total Protein: 7.1 g/dL (ref 6.0–8.3)

## 2010-05-06 ENCOUNTER — Encounter: Admission: RE | Admit: 2010-05-06 | Discharge: 2010-05-06 | Payer: Self-pay | Admitting: Oncology

## 2010-07-31 ENCOUNTER — Ambulatory Visit: Payer: Self-pay | Admitting: Oncology

## 2010-08-02 LAB — COMPREHENSIVE METABOLIC PANEL
ALT: 18 U/L (ref 0–35)
BUN: 13 mg/dL (ref 6–23)
CO2: 29 mEq/L (ref 19–32)
Calcium: 9.4 mg/dL (ref 8.4–10.5)
Chloride: 105 mEq/L (ref 96–112)
Creatinine, Ser: 0.97 mg/dL (ref 0.40–1.20)
Total Bilirubin: 0.3 mg/dL (ref 0.3–1.2)

## 2010-08-02 LAB — LACTATE DEHYDROGENASE: LDH: 156 U/L (ref 94–250)

## 2010-08-02 LAB — CBC WITH DIFFERENTIAL/PLATELET
BASO%: 0.4 % (ref 0.0–2.0)
Basophils Absolute: 0 10*3/uL (ref 0.0–0.1)
EOS%: 1.2 % (ref 0.0–7.0)
HCT: 35.6 % (ref 34.8–46.6)
HGB: 11.8 g/dL (ref 11.6–15.9)
LYMPH%: 22.1 % (ref 14.0–49.7)
MCH: 29.8 pg (ref 25.1–34.0)
MCHC: 33.1 g/dL (ref 31.5–36.0)
MONO#: 0.6 10*3/uL (ref 0.1–0.9)
NEUT%: 68.5 % (ref 38.4–76.8)
Platelets: 198 10*3/uL (ref 145–400)
lymph#: 1.7 10*3/uL (ref 0.9–3.3)

## 2010-08-03 LAB — CANCER ANTIGEN 27.29: CA 27.29: 15 U/mL (ref 0–39)

## 2010-08-03 LAB — VITAMIN D 25 HYDROXY (VIT D DEFICIENCY, FRACTURES): Vit D, 25-Hydroxy: 89 ng/mL (ref 30–89)

## 2010-08-15 ENCOUNTER — Ambulatory Visit: Payer: Self-pay | Admitting: Family Medicine

## 2010-11-12 NOTE — Assessment & Plan Note (Signed)
Summary: CHECK GLUCOSE MACHINE/KH  Nurse Visit  Patient comes to office with her current One Touch Ultra Glucose monitor. She states she has not used it for a year and is having trouble getting it to work. The glucose strips are 5 years out of date as is the control solution.  Advised  patient she needs new strips  and control solution.  Telephoned Rx in . Was told that the control solution will cost $12.00 and a new monitor with the control solution in it will cost a little over $11.00. Patient will get a new monitor as her monitor is over 19 years old anyway . Marland Kitchen Advised  her to come back if she has any problems but expect pharmacist will help her get new monitor set up. She will check sugars twice daily as directed by Dr. Epifania Gore RN  Feb 19, 2010 11:49 AM   Allergies: 1)  Glucophage (Metformin Hcl)  Orders Added: 1)  Est Level 1- [facility] [[address]562]

## 2010-11-12 NOTE — Assessment & Plan Note (Signed)
Summary: pt summary/follow up diabetes   Vital Signs:  Patient profile:   70 year old female Height:      62.5 inches Weight:      151.8 pounds BMI:     27.42 Temp:     98.2 degrees F oral Pulse rate:   60 / minute BP sitting:   115 / 74  (left arm) Cuff size:   regular  Vitals Entered By: Gladstone Pih (April 09, 2010 1:34 PM) CC: F/U DM Is Patient Diabetic? Yes Did you bring your meter with you today? Yes Pain Assessment Patient in pain? no        Primary Care Provider:  Delbert Harness MD  CC:  F/U DM.  History of Present Illness: 1.  diabetes follow-up--seen 5/5.  A1C had increased from <7 to 8.0.  since that time, pt has gone to nutrition counseling, started watching her diet and started exercising.  we also increased her glucotrol.    sugars are as follows:  fasting:  123-218 post-prandial:  151-216  Habits & Providers  Alcohol-Tobacco-Diet     Tobacco Status: never  Current Medications (verified): 1)  Actos 45 Mg Tabs (Pioglitazone Hcl) .Marland Kitchen.. 1 Tab By Mouth Daily For Diabetes 2)  Bayer Childrens Aspirin 81 Mg Chew (Aspirin) .... Take 1 Tablet By Mouth Once A Day 3)  Glucotrol Xl 10 Mg Tb24 (Glipizide) .... Take 2 Tablets By Mouth Once A Day 4)  Hydrochlorothiazide 25 Mg Tabs (Hydrochlorothiazide) .... Take 1 Tablet By Mouth Once A Day - Must Make and Keep Appt For Additional Refills 5)  Norvasc 10 Mg Tabs (Amlodipine Besylate) .... Take 1 Tablet By Mouth Once A Day 6)  Not On Beta Blocker Due To Bradycardia .... Per Dr. Donnie Aho (Cardiologist) 7)  Vitamin D3 1000 Unit Tabs (Cholecalciferol) .Marland Kitchen.. 1 Tablet By Mouth Daily After Finishing 8 Week Repletion 8)  Femara 2.5 Mg Tabs (Letrozole) .... Once Daily 9)  Nitrolingual 0.4 Mg/spray Soln (Nitroglycerin) .... As Needed 10)  Crestor 40 Mg Tabs (Rosuvastatin Calcium) .Marland Kitchen.. 1 Tab By Mouth At Bedtime For Cholesterol 11)  Benicar 40 Mg Tabs (Olmesartan Medoxomil) .Marland Kitchen.. 1 Tab By Mouth Daily For Blood Pressure 12)   Acetaminophen 500 Mg Tabs (Acetaminophen) .... 2 Tablets By Mouth Three Times A Day As Needed For Your Hands 13)  One Touch Ultra Glucose Monitor .... Check Blood Sugars Twice Daily 14)  One Touch Ultra Glucose Strips .... Check Blood Sugars Twice Daily 15)  Lancets  Misc (Lancets) .... Use As Directed  Past History:  Past Medical History: Last updated: 2009-07-23 Mixed urinary incontinence ( Dr. Annabell Howells) - s/p Urodynamics and Therapy with improvement Hx of cercical CA with radiation therapy in her 30s - no cervix, but gets paps of vaginal cuff Acute MI (inferior) 11/24/02 with RCA stent placement - Dr. Donnie Aho (Cardiologist) -->sees him once a yr    - Treadmill Cardiolyte in 11/05    - Treadmill Cardiolyte in 1/09 (results unk) breast cancer s/p radiation diagnosed 2009.  currently on femara; Dr Mora Bellman  Past Surgical History: Last updated: 07/23/09 cath 11/24/02 - PTCA/stent RCA, EF 50%, mild hypokinesis inf. Wall  cold and hot nodule on RAIU - R thyroid lobectomy-benign Hurthle cell neoplasm-nl TSH postop - 03/14/2003,  neck cyst removal - 07/92 Hx Cervical cancer s/p radiation tx - 1976, uterus `snipped` 1970s, s/p BSO?   lumpectomy 2009  Family History: Last updated: 07/23/09 Daughter--DM F--MI 54 M died MI 16 No CA,CVA Daughter - Nephrotic Syndrome  Social History: Last updated: 06/27/2009 Lives alone.  Has 3 daughters in [place], 1 daughter in [place], 1 son in [place].     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 No tobacco, no EtOH, no drugs.  Review of Systems  The patient denies anorexia, weight loss, chest pain, and dyspnea on exertion.    Physical Exam  General:  Well-developed,well-nourished,in no acute distress; alert,appropriate and cooperative throughout examination Lungs:  Normal respiratory effort, chest expands symmetrically. Lungs are clear to auscultation, no crackles or wheezes. Heart:  Normal rate and regular rhythm. S1 and S2 normal without gallop,  murmur, click, rub or other extra sounds. Additional Exam:  vital signs reviewed    Impression & Recommendations:  Problem # 1:  DIABETES MELLITUS II, UNCOMPLICATED (ICD-250.00) Assessment Improved  sugars are not yet at goal.  on max dose of actos and high dose of glucotrol XL.  she has had metformin in the past and it gave her stomach upset.  However, I think it would be worth trying it again at a low dose.  rtc begining of august.   Her updated medication list for this problem includes:    Actos 45 Mg Tabs (Pioglitazone hcl) .Marland Kitchen... 1 tab by mouth daily for diabetes    Bayer Childrens Aspirin 81 Mg Chew (Aspirin) .Marland Kitchen... Take 1 tablet by mouth once a day    Glucotrol Xl 10 Mg Tb24 (Glipizide) .Marland Kitchen... Take 2 tablets by mouth once a day    Benicar 40 Mg Tabs (Olmesartan medoxomil) .Marland Kitchen... 1 tab by mouth daily for blood pressure    Metformin Hcl 500 Mg Tabs (Metformin hcl) .Marland Kitchen... 1 tab by mouth daily for 2 weeks; then 1 tab by mouth two times a day  Orders: [facility]- Est Level  3 (08657)  Problem # 2:  CARCINOMA, BREAST, STAGE I, ESTROGEN RECEPTOR POSITIVE (ICD-174.9) Assessment: Comment Only s/p radiation and femara.  followed by DR Mora Bellman  Problem # 3:  CORONARY, ARTERIOSCLEROSIS (ICD-414.00) Assessment: Comment Only followed by Dr Donnie Aho.  Had MI and stent placement Her updated medication list for this problem includes:    Bayer Childrens Aspirin 81 Mg Chew (Aspirin) .Marland Kitchen... Take 1 tablet by mouth once a day    Hydrochlorothiazide 25 Mg Tabs (Hydrochlorothiazide) .Marland Kitchen... Take 1 tablet by mouth once a day - must make and keep appt for additional refills    Norvasc 10 Mg Tabs (Amlodipine besylate) .Marland Kitchen... Take 1 tablet by mouth once a day    Nitrolingual 0.4 Mg/spray Soln (Nitroglycerin) .Marland Kitchen... As needed    Benicar 40 Mg Tabs (Olmesartan medoxomil) .Marland Kitchen... 1 tab by mouth daily for blood pressure  Problem # 4:  HYPERTENSION, BENIGN SYSTEMIC (ICD-401.1) Assessment: Unchanged great control Her updated  medication list for this problem includes:    Hydrochlorothiazide 25 Mg Tabs (Hydrochlorothiazide) .Marland Kitchen... Take 1 tablet by mouth once a day - must make and keep appt for additional refills    Norvasc 10 Mg Tabs (Amlodipine besylate) .Marland Kitchen... Take 1 tablet by mouth once a day    Benicar 40 Mg Tabs (Olmesartan medoxomil) .Marland Kitchen... 1 tab by mouth daily for blood pressure  Problem # 5:  HYPERCHOLESTEROLEMIA (ICD-272.0) Assessment: Unchanged great control Her updated medication list for this problem includes:    Crestor 40 Mg Tabs (Rosuvastatin calcium) .Marland Kitchen... 1 tab by mouth at bedtime for cholesterol  Problem # 6:  CERVICAL CANCER, HX OF (ICD-V10.41) Assessment: Unchanged has had hysterectomy, but gets paps of vaginal cuff regularly  Problem # 7:  HYPERTHYROIDISM, NOS (ICD-242.90) last tsh normal Labs Reviewed: TSH: 0.641 (06/08/2009)     Problem # 8:  HAND PAIN, RIGHT (ICD-729.5) Assessment: Comment Only did not really discuss today.  likely carpal tunnel.  given wrist splint 5/11.    Complete Medication List: 1)  Actos 45 Mg Tabs (Pioglitazone hcl) .Marland Kitchen.. 1 tab by mouth daily for diabetes 2)  Bayer Childrens Aspirin 81 Mg Chew (Aspirin) .... Take 1 tablet by mouth once a day 3)  Glucotrol Xl 10 Mg Tb24 (Glipizide) .... Take 2 tablets by mouth once a day 4)  Hydrochlorothiazide 25 Mg Tabs (Hydrochlorothiazide) .... Take 1 tablet by mouth once a day - must make and keep appt for additional refills 5)  Norvasc 10 Mg Tabs (Amlodipine besylate) .... Take 1 tablet by mouth once a day 6)  Not On Beta Blocker Due To Bradycardia  .... Per dr. Donnie Aho (cardiologist) 7)  Vitamin D3 1000 Unit Tabs (Cholecalciferol) .Marland Kitchen.. 1 tablet by mouth daily after finishing 8 week repletion 8)  Femara 2.5 Mg Tabs (Letrozole) .... Once daily 9)  Nitrolingual 0.4 Mg/spray Soln (Nitroglycerin) .... As needed 10)  Crestor 40 Mg Tabs (Rosuvastatin calcium) .Marland Kitchen.. 1 tab by mouth at bedtime for cholesterol 11)  Benicar 40 Mg Tabs  (Olmesartan medoxomil) .Marland Kitchen.. 1 tab by mouth daily for blood pressure 12)  Acetaminophen 500 Mg Tabs (Acetaminophen) .... 2 tablets by mouth three times a day as needed for your hands 13)  One Touch Ultra Glucose Monitor  .... Check blood sugars twice daily 14)  One Touch Ultra Glucose Strips  .... Check blood sugars twice daily 15)  Lancets Misc (Lancets) .... Use as directed 16)  Metformin Hcl 500 Mg Tabs (Metformin hcl) .Marland Kitchen.. 1 tab by mouth daily for 2 weeks; then 1 tab by mouth two times a day  Other Orders: T-Comprehensive Metabolic Panel ([id number])  Patient Instructions: 1)  It was nice to see you today. 2)  Your sugars look better! 3)  Keep up the good work watching your diet and exercising. 4)  Try the metformin I prescribed you.  If you have problems, call us.   5)  Please schedule a follow-up appointment in the beginning of August.  Your new doctor is Dr. Earnest Bailey.   Prescriptions: METFORMIN HCL 500 MG TABS (METFORMIN HCL) 1 tab by mouth daily for 2 weeks; then 1 tab by mouth two times a day  #60 x 6   Entered and Authorized by:   Asher Muir MD   Signed by:   Asher Muir MD on 04/09/2010   Method used:   Electronically to        [facility] [phone]* (retail)       [address]       [place], [place]  [address]       Ph: [phone] or [phone]       Fax: [phone]   RxID:   [id number]   Prevention & Chronic Care Immunizations   Influenza vaccine: Fluvax MCR  (08/24/2009)   Influenza vaccine due: 07/24/2009    Tetanus booster: 03/13/2006: Done.   Tetanus booster due: 03/13/2016    Pneumococcal vaccine: Next in 10 yrs  (11/14/1999)   Pneumococcal vaccine due: 11/13/2004    H. zoster vaccine: Not documented  Colorectal Screening   Hemoccult: Done.  (05/13/2006)   Hemoccult due: Not Indicated    Colonoscopy: Results: Polyps.    (06/26/2009)   Colonoscopy action/deferral:  GI Referral  (06/08/2009)    Colonoscopy due: 06/2014  Other Screening   Pap smear: NEGATIVE FOR INTRAEPITHELIAL LESIONS OR MALIGNANCY.  (06/27/2009)   Pap smear due: 06/28/2011    Mammogram: Poss R breast mass, L breast normal - further imaging pending  (05/03/2008)   Mammogram due: 05/03/2009    DXA bone density scan: Osteopenia  (05/02/2008)   DXA scan due: 05/02/2010    Smoking status: never  (04/09/2010)  Diabetes Mellitus   HgbA1C: 8.0  (02/14/2010)   Hemoglobin A1C due: 02/21/2009    Eye exam: No diabetic retinopathy.     (12/14/2009)   Eye exam due: 12/2010    Foot exam: yes  (12/19/2008)   High risk foot: Not documented   Foot care education: completed  (08/24/2008)   Foot exam due: 02/14/2009    Urine microalbumin/creatinine ratio: Not documented   Urine microalbumin/cr due: 04/18/2009    Diabetes flowsheet reviewed?: Yes   Progress toward A1C goal: Unchanged    Stage of readiness to change (diabetes management): Action  Lipids   Total Cholesterol: 180  (04/17/2008)   Lipid panel action/deferral: LDL Direct Ordered   LDL: 161  (04/17/2008)   LDL Direct: 67  (02/14/2010)   HDL: 53  (04/17/2008)   Triglycerides: 86  (04/17/2008)    SGOT (AST): 15  (06/08/2009)   BMP action: Ordered   SGPT (ALT): 11  (06/08/2009) CMP ordered    Alkaline phosphatase: 86  (06/08/2009)   Total bilirubin: 0.2  (06/08/2009)    Lipid flowsheet reviewed?: Yes   Progress toward LDL goal: At goal  Hypertension   Last Blood Pressure: 115 / 74  (04/09/2010)   Serum creatinine: 0.95  (06/08/2009)   BMP action: Ordered   Serum potassium 3.9  (06/08/2009) CMP ordered     Hypertension flowsheet reviewed?: Yes   Progress toward BP goal: At goal  Self-Management Support :   Personal Goals (by the next clinic visit) :     Personal A1C goal: 7  (06/08/2009)     Personal blood pressure goal: 130/80  (06/08/2009)     Personal LDL goal: 100  (06/08/2009)    Diabetes self-management support: Copy of home  glucose meter record, CBG self-monitoring log, Written self-care plan  (04/09/2010)   Diabetes care plan printed    Hypertension self-management support: Written self-care plan  (06/27/2009)    Lipid self-management support: Lipid monitoring log, Written self-care plan  (02/14/2010)

## 2010-11-12 NOTE — Assessment & Plan Note (Signed)
Summary: sore throat,tcb   Vital Signs:  Patient profile:   70 year old female Height:      62.5 inches Weight:      151.44 pounds BMI:     27.36 Temp:     99.1 degrees F oral Pulse rate:   71 / minute BP sitting:   133 / 80  (right arm)  Vitals Entered By: Terese Door (March 18, 2010 3:21 PM) CC: Sore throat Is Patient Diabetic? Yes Pain Assessment Patient in pain? yes     Location: throat Intensity: 8 Type: sharp   Primary Care Provider:  Asher Muir MD  CC:  Sore throat.  History of Present Illness: 1.  sore throat--past 3 days.  no nasal congestion, cough, fever.  ear is hurting a little.  no nausea/vomitting/diarrhea.  no abd pain.  has taken ibuprofen, which helps temporarily  contact [phone]  Habits & Providers  Alcohol-Tobacco-Diet     Tobacco Status: never  Current Medications (verified): 1)  Actos 45 Mg Tabs (Pioglitazone Hcl) .Marland Kitchen.. 1 Tab By Mouth Daily For Diabetes 2)  Bayer Childrens Aspirin 81 Mg Chew (Aspirin) .... Take 1 Tablet By Mouth Once A Day 3)  Glucotrol Xl 10 Mg Tb24 (Glipizide) .... Take 2 Tablets By Mouth Once A Day 4)  Hydrochlorothiazide 25 Mg Tabs (Hydrochlorothiazide) .... Take 1 Tablet By Mouth Once A Day - Must Make and Keep Appt For Additional Refills 5)  Norvasc 10 Mg Tabs (Amlodipine Besylate) .... Take 1 Tablet By Mouth Once A Day 6)  Not On Beta Blocker Due To Bradycardia .... Per Dr. Donnie Aho (Cardiologist) 7)  Vitamin D3 1000 Unit Tabs (Cholecalciferol) .Marland Kitchen.. 1 Tablet By Mouth Daily After Finishing 8 Week Repletion 8)  Femara 2.5 Mg Tabs (Letrozole) .... Once Daily 9)  Nitrolingual 0.4 Mg/spray Soln (Nitroglycerin) .... As Needed 10)  Crestor 40 Mg Tabs (Rosuvastatin Calcium) .Marland Kitchen.. 1 Tab By Mouth At Bedtime For Cholesterol 11)  Benicar 40 Mg Tabs (Olmesartan Medoxomil) .Marland Kitchen.. 1 Tab By Mouth Daily For Blood Pressure 12)  Acetaminophen 500 Mg Tabs (Acetaminophen) .... 2 Tablets By Mouth Three Times A Day As Needed For Your  Hands 13)  One Touch Ultra Glucose Monitor .... Check Blood Sugars Twice Daily 14)  One Touch Ultra Glucose Strips .... Check Blood Sugars Twice Daily 15)  Lancets  Misc (Lancets) .... Use As Directed  Allergies: 1)  Glucophage (Metformin Hcl)  Physical Exam  General:  Well-developed,well-nourished,in no acute distress; alert,appropriate and cooperative throughout examination Ears:  External ear exam shows no significant lesions or deformities.  Otoscopic examination reveals clear canals, tympanic membranes are intact bilaterally without bulging, retraction, inflammation or discharge. Hearing is grossly normal bilaterally. Nose:  External nasal examination shows no deformity or inflammation. Nasal mucosa are pink and moist without lesions or exudates. Mouth:  pharyngeal erythema.  mild.  no exudate Neck:  very large ant cervical lymphadenopathy.  tender.  neck is supple with good rom Lungs:  Normal respiratory effort, chest expands symmetrically. Lungs are clear to auscultation, no crackles or wheezes. Heart:  Normal rate and regular rhythm. S1 and S2 normal without gallop, murmur, click, rub or other extra sounds. Additional Exam:  vital signs reviewed    Impression & Recommendations:  Problem # 1:  SORE THROAT (ICD-462) Assessment New rapid strep negative.  likely viral uri.  supportive care as outlined in pt instructions Her updated medication list for this problem includes:    Bayer Childrens Aspirin 81 Mg Chew (Aspirin) .Marland KitchenMarland KitchenMarland KitchenMarland Kitchen  Take 1 tablet by mouth once a day    Acetaminophen 500 Mg Tabs (Acetaminophen) .Marland Kitchen... 2 tablets by mouth three times a day as needed for your hands  Orders: Rapid Strep-[facility] ([address]) [facility]- Est Level  3 (60454)  Complete Medication List: 1)  Actos 45 Mg Tabs (Pioglitazone hcl) .Marland Kitchen.. 1 tab by mouth daily for diabetes 2)  Bayer Childrens Aspirin 81 Mg Chew (Aspirin) .... Take 1 tablet by mouth once a day 3)  Glucotrol Xl 10 Mg Tb24 (Glipizide) .... Take 2  tablets by mouth once a day 4)  Hydrochlorothiazide 25 Mg Tabs (Hydrochlorothiazide) .... Take 1 tablet by mouth once a day - must make and keep appt for additional refills 5)  Norvasc 10 Mg Tabs (Amlodipine besylate) .... Take 1 tablet by mouth once a day 6)  Not On Beta Blocker Due To Bradycardia  .... Per dr. Donnie Aho (cardiologist) 7)  Vitamin D3 1000 Unit Tabs (Cholecalciferol) .Marland Kitchen.. 1 tablet by mouth daily after finishing 8 week repletion 8)  Femara 2.5 Mg Tabs (Letrozole) .... Once daily 9)  Nitrolingual 0.4 Mg/spray Soln (Nitroglycerin) .... As needed 10)  Crestor 40 Mg Tabs (Rosuvastatin calcium) .Marland Kitchen.. 1 tab by mouth at bedtime for cholesterol 11)  Benicar 40 Mg Tabs (Olmesartan medoxomil) .Marland Kitchen.. 1 tab by mouth daily for blood pressure 12)  Acetaminophen 500 Mg Tabs (Acetaminophen) .... 2 tablets by mouth three times a day as needed for your hands 13)  One Touch Ultra Glucose Monitor  .... Check blood sugars twice daily 14)  One Touch Ultra Glucose Strips  .... Check blood sugars twice daily 15)  Lancets Misc (Lancets) .... Use as directed  Patient Instructions: 1)  It was nice to see you today.  2)  For your sore throat, take motrin about every 6 hours. 3)  Try sugar free popsicles and lots of fluids. 4)  Rest as much as you can. 5)  If you're not better in a week, make a follow up appointment. 6)  If you get temperature >101, sore throat gets worse, or other concerns, call the office.  Laboratory Results  Date/Time Received: March 18, 2010 4:17 PM  Date/Time Reported: March 18, 2010 4:27 PM   Other Tests  Rapid Strep: negative Comments: ...............test performed by......Marland KitchenBonnie A. [place], MLS (ASCP)cm

## 2010-11-12 NOTE — Assessment & Plan Note (Signed)
Summary: nutrition counseling w/ hx of DM   Vital Signs:  Patient profile:   70 year old female Height:      62.5 inches Weight:      152.4 pounds  Primary Care Provider:  Delbert Harness MD  CC:  nutrition counseling.  History of Present Illness: 70yo F here for nutritional counseling  24 hour recall: Breakfast- Instant oatmeal w/ cinnamon (170cal), 1 1/2 cup coffee (maxwell caffenated) 9am; Snack- banana 11am; Lunch- 2 slices of [place] on wheat bread w/ mayonnaise, lipton sweet tea (16-20oz) 12:30pm; Dinner- spaghetti w/ hamburger meat (2 cups) and water 6pm; Snack- eskimo pie, 8pm  Denies eating out of boredom.  Admits to enjoying sweet foods.  AM CBGs- 120-140s; highest CBG 210s (post-prandial); denies any hypoglycemic events  Physical activity: Mainly housework.     Current Medications (verified): 1)  Actos 45 Mg Tabs (Pioglitazone Hcl) .Marland Kitchen.. 1 Tab By Mouth Daily For Diabetes 2)  Bayer Childrens Aspirin 81 Mg Chew (Aspirin) .... Take 1 Tablet By Mouth Once A Day 3)  Glucotrol Xl 10 Mg Tb24 (Glipizide) .... Take 2 Tablets By Mouth Once A Day 4)  Hydrochlorothiazide 25 Mg Tabs (Hydrochlorothiazide) .... Take 1 Tablet By Mouth Once A Day - Must Make and Keep Appt For Additional Refills 5)  Norvasc 10 Mg Tabs (Amlodipine Besylate) .... Take 1 Tablet By Mouth Once A Day 6)  Not On Beta Blocker Due To Bradycardia .... Per Dr. Donnie Aho (Cardiologist) 7)  Vitamin D3 1000 Unit Tabs (Cholecalciferol) .Marland Kitchen.. 1 Tablet By Mouth Daily After Finishing 8 Week Repletion 8)  Femara 2.5 Mg Tabs (Letrozole) .... Once Daily 9)  Nitrolingual 0.4 Mg/spray Soln (Nitroglycerin) .... As Needed 10)  Crestor 40 Mg Tabs (Rosuvastatin Calcium) .Marland Kitchen.. 1 Tab By Mouth At Bedtime For Cholesterol 11)  Benicar 40 Mg Tabs (Olmesartan Medoxomil) .Marland Kitchen.. 1 Tab By Mouth Daily For Blood Pressure 12)  Acetaminophen 500 Mg Tabs (Acetaminophen) .... 2 Tablets By Mouth Three Times A Day As Needed For Your Hands 13)  One Touch  Ultra Glucose Monitor .... Check Blood Sugars Twice Daily 14)  One Touch Ultra Glucose Strips .... Check Blood Sugars Twice Daily 15)  Lancets  Misc (Lancets) .... Use As Directed  Allergies (verified): 1)  Glucophage (Metformin Hcl)  Physical Exam  General:  VS Reviewed. Well appearing, NAD.    Impression & Recommendations:  Problem # 1:  DIABETES MELLITUS II, UNCOMPLICATED (ICD-250.00) Assessment Unchanged  Deteoriating A1c over the past few months strongly associated with poor food choices. Nutrition counseling discussed with Dr. Gerilyn Pilgrim present. Regimen provided for physical activity and changes in nutrition plan. Will f/u with Dr. Earnest Bailey in 2-4 weeks to reassess.  Her updated medication list for this problem includes:    Actos 45 Mg Tabs (Pioglitazone hcl) .Marland Kitchen... 1 tab by mouth daily for diabetes    Bayer Childrens Aspirin 81 Mg Chew (Aspirin) .Marland Kitchen... Take 1 tablet by mouth once a day    Glucotrol Xl 10 Mg Tb24 (Glipizide) .Marland Kitchen... Take 2 tablets by mouth once a day    Benicar 40 Mg Tabs (Olmesartan medoxomil) .Marland Kitchen... 1 tab by mouth daily for blood pressure  Orders: [facility]- Est Level  3 (99213)  Complete Medication List: 1)  Actos 45 Mg Tabs (Pioglitazone hcl) .Marland Kitchen.. 1 tab by mouth daily for diabetes 2)  Bayer Childrens Aspirin 81 Mg Chew (Aspirin) .... Take 1 tablet by mouth once a day 3)  Glucotrol Xl 10 Mg Tb24 (Glipizide) .... Take 2 tablets  by mouth once a day 4)  Hydrochlorothiazide 25 Mg Tabs (Hydrochlorothiazide) .... Take 1 tablet by mouth once a day - must make and keep appt for additional refills 5)  Norvasc 10 Mg Tabs (Amlodipine besylate) .... Take 1 tablet by mouth once a day 6)  Not On Beta Blocker Due To Bradycardia  .... Per dr. Donnie Aho (cardiologist) 7)  Vitamin D3 1000 Unit Tabs (Cholecalciferol) .Marland Kitchen.. 1 tablet by mouth daily after finishing 8 week repletion 8)  Femara 2.5 Mg Tabs (Letrozole) .... Once daily 9)  Nitrolingual 0.4 Mg/spray Soln (Nitroglycerin) .... As  needed 10)  Crestor 40 Mg Tabs (Rosuvastatin calcium) .Marland Kitchen.. 1 tab by mouth at bedtime for cholesterol 11)  Benicar 40 Mg Tabs (Olmesartan medoxomil) .Marland Kitchen.. 1 tab by mouth daily for blood pressure 12)  Acetaminophen 500 Mg Tabs (Acetaminophen) .... 2 tablets by mouth three times a day as needed for your hands 13)  One Touch Ultra Glucose Monitor  .... Check blood sugars twice daily 14)  One Touch Ultra Glucose Strips  .... Check blood sugars twice daily 15)  Lancets Misc (Lancets) .... Use as directed  Patient Instructions: 1)  Follow up with Dr. Earnest Bailey in 2-4 weeks to reassess new nutrition regimen. 2)  Recommendations: 3)  Cook your own oatmeal (consider cooking it the night before) and add splenda for sweetening 4)  Consider alternatives to sweet snacks such as fruits.   5)  Replace the sugary drinks with either water or sugar free drinks. 6)  When choosing a cereal, choose one with at least 5 grams of fiber. 7)  When checking your blood sugar after a meal, be consistent in checking it 1 1/2 to 2 hours after you start eating. 8)  Get twice as many vegetables at lunch and dinner than grains and meats. 9)  Consistent exercise daily (start at 10 minutes or more at at time and increase the time each week)

## 2010-11-12 NOTE — Miscellaneous (Signed)
Summary: refill.  med change  Clinical Lists Changes  Medications: Changed medication from NORVASC 10 MG TABS (AMLODIPINE BESYLATE) Take 1/2 tablet by mouth once a day to NORVASC 10 MG TABS (AMLODIPINE BESYLATE) Take 1 tablet by mouth once a day - Signed Rx of NORVASC 10 MG TABS (AMLODIPINE BESYLATE) Take 1 tablet by mouth once a day;  #90 x 1;  Signed;  Entered by: Asher Muir MD;  Authorized by: Asher Muir MD;  Method used: [facility] [address] [phone]*, [address], [place], [place], [place]  [address], Ph: [phone] or [phone], Fax: [phone]    Prescriptions: NORVASC 10 MG TABS (AMLODIPINE BESYLATE) Take 1 tablet by mouth once a day  #90 x 1   Entered and Authorized by:   Asher Muir MD   Signed by:   Asher Muir MD on 01/11/2010   Method used:   [facility]ronically to        [facility] [address] [phone]* (retail)       [address]       [place], [place]  [address]       Ph: [phone] or [phone]       Fax: [phone]   RxID:   [id number]

## 2010-11-12 NOTE — Letter (Signed)
Summary: Generic Letter  [facility]  [address]   [place], [place] [address]   Phone: [phone]  Fax: [phone]    04/10/2010  Dana Coleman [address] [place], [place]  [address]  Dear Ms. Burnadette Pop,    I just wanted to let you know that your lab results were normal.  Please call me if you have any questions or concerns.          Sincerely,   Asher Muir MD  Appended Document: Generic Letter mailed

## 2010-11-12 NOTE — Consult Note (Signed)
Summary: [facility]   Imported By: Clydell Hakim 12/18/2009 16:50:42  _____________________________________________________________________  External Attachment:    Type:   Image     Comment:   External Document  Appended Document: [facility]    Clinical Lists Changes  Observations: Added new observation of [id number]: 12/2010 (12/19/2009 11:52) Added new observation of DIAB EYE EX: No diabetic retinopathy.    (12/14/2009 11:53)       Diabetic Eye Exam  Procedure date:  12/14/2009  Findings:      No diabetic retinopathy.     Procedures Next Due Date:    Diabetic Eye Exam: 12/2010

## 2010-11-12 NOTE — Assessment & Plan Note (Signed)
Summary: dm, htn, hld, hand pain   Vital Signs:  Patient profile:   70 year old female Height:      62.5 inches Weight:      154.9 pounds BMI:     27.98 Temp:     98.3 degrees F oral Pulse rate:   66 / minute BP sitting:   130 / 77  (left arm) Cuff size:   regular  Vitals Entered By: Gladstone Pih (Feb 14, 2010 1:47 PM) CC: hand pain, dm, htn, hld Is Patient Diabetic? Yes Did you bring your meter with you today? No Pain Assessment Patient in pain? no        Primary Care Provider:  Asher Muir MD  CC:  hand pain, dm, htn, and hld.  History of Present Illness: 1.  right fingers hurting and tingling--past 6 months.  gradual onset.  getting worse.  middle finger pops.  tingling.  can't bend middle one all the way.  pain, sometimes tingling.  hurts most of the time.  no swelling.  no redness.  right hand only.  has not tried and tylenol or nsaids.  mostly pip joint of middle finger and dip joint of index finger.  she is right-handed.  worse after using her hands alot (ie, housework) and during the night and first thing in the morning.    2.  diabetes--A1C increased from 6.6 to 8.  on actos and glucotrol.  has been on metformin before, but it was stopped due to GI upset.  she tells me that she has been eating alot more sweets over the past months, and she thinks that may account for the increase in A1C.  no hypoglycemic episodes.  no chest pain.  3.  hypertension-at goal on hctz, norvasc and benicar  4.  hyperlipidemia--had been above goal; then started on crestor.  due today for ldl.  Habits & Providers  Alcohol-Tobacco-Diet     Tobacco Status: never  Current Medications (verified): 1)  Actos 15 Mg Tabs (Pioglitazone Hcl) .... Take 1 Tablet By Mouth Once A Day 2)  Bayer Childrens Aspirin 81 Mg Chew (Aspirin) .... Take 1 Tablet By Mouth Once A Day 3)  Glucotrol Xl 10 Mg Tb24 (Glipizide) .... Take 1 Tablet By Mouth Once A Day 4)  Hydrochlorothiazide 25 Mg Tabs  (Hydrochlorothiazide) .... Take 1 Tablet By Mouth Once A Day - Must Make and Keep Appt For Additional Refills 5)  Norvasc 10 Mg Tabs (Amlodipine Besylate) .... Take 1 Tablet By Mouth Once A Day 6)  Not On Beta Blocker Due To Bradycardia .... Per Dr. Donnie Aho (Cardiologist) 7)  Vitamin D3 1000 Unit Tabs (Cholecalciferol) .Marland Kitchen.. 1 Tablet By Mouth Daily After Finishing 8 Week Repletion 8)  Femara 2.5 Mg Tabs (Letrozole) .... Once Daily 9)  Nitrolingual 0.4 Mg/spray Soln (Nitroglycerin) .... As Needed 10)  Crestor 40 Mg Tabs (Rosuvastatin Calcium) .Marland Kitchen.. 1 Tab By Mouth At Bedtime For Cholesterol 11)  Benicar 40 Mg Tabs (Olmesartan Medoxomil) .Marland Kitchen.. 1 Tab By Mouth Daily For Blood Pressure  Allergies: 1)  Glucophage (Metformin Hcl)  Review of Systems  The patient denies chest pain, dyspnea on exertion, and prolonged cough.         4 pound wt gain since last visit. CV:  Denies chest pain or discomfort. MS:  Complains of loss of strength; denies joint redness.  Physical Exam  General:  pleasant; well groomed; overweight Msk:  hands and wrists:  no significant swelling, or redness. heberden node on right  index finger.  bony deformity of right middle finger.  grip strength on right is slightly less than left (4+/5 vs 5/5).  full rom of wrist and thumb.  2+ radial pulses.  +phalens and tinnels on the right. Extremities:  no foot lesions Additional Exam:  vital signs reviewed    Impression & Recommendations:  Problem # 1:  HAND PAIN, RIGHT (ICD-729.5) Assessment New  think this is most likely carpal tunnel.  will try wrist splints.  if that is ineffective, consider injection  Orders: [facility]- Est  Level 4 (92119)  Problem # 2:  DIABETES MELLITUS II, UNCOMPLICATED (ICD-250.00) Assessment: Deteriorated deterioration due to reported dietary indiscretions.  however, also wonder if she losing the ability to produce insulin.  she will make dietary changes.  she is not interested in nutritional counseling  at this time.  also increase glucotrol.  she is to call me with her sugars and we may then increase her actos.  another option is to add back on low dose metformin as tolerated.  I broached the idea of insulin with her, and she is very resistant to it at this time.   Her updated medication list for this problem includes:    Actos 15 Mg Tabs (Pioglitazone hcl) .Marland Kitchen... Take 1 tablet by mouth once a day    Bayer Childrens Aspirin 81 Mg Chew (Aspirin) .Marland Kitchen... Take 1 tablet by mouth once a day    Glucotrol Xl 10 Mg Tb24 (Glipizide) .Marland Kitchen... Take 2 tablets by mouth once a day    Benicar 40 Mg Tabs (Olmesartan medoxomil) .Marland Kitchen... 1 tab by mouth daily for blood pressure  Orders: A1C-[facility] (41740) [facility]- Est  Level 4 (81448)  Problem # 3:  HYPERTENSION, BENIGN SYSTEMIC (ICD-401.1) Assessment: Unchanged  at goal Her updated medication list for this problem includes:    Hydrochlorothiazide 25 Mg Tabs (Hydrochlorothiazide) .Marland Kitchen... Take 1 tablet by mouth once a day - must make and keep appt for additional refills    Norvasc 10 Mg Tabs (Amlodipine besylate) .Marland Kitchen... Take 1 tablet by mouth once a day    Benicar 40 Mg Tabs (Olmesartan medoxomil) .Marland Kitchen... 1 tab by mouth daily for blood pressure  Orders: [facility]- Est  Level 4 ([address]214)  Problem # 4:  HYPERCHOLESTEROLEMIA (ICD-272.0) Assessment: Unchanged check ldl Her updated medication list for this problem includes:    Crestor 40 Mg Tabs (Rosuvastatin calcium) .Marland Kitchen... 1 tab by mouth at bedtime for cholesterol  Orders: T-Lipoprotein (LDL cholesterol)  (18563-14970) [facility]- Est  Level 4 (99214)  Complete Medication List: 1)  Actos 15 Mg Tabs (Pioglitazone hcl) .... Take 1 tablet by mouth once a day 2)  Bayer Childrens Aspirin 81 Mg Chew (Aspirin) .... Take 1 tablet by mouth once a day 3)  Glucotrol Xl 10 Mg Tb24 (Glipizide) .... Take 2 tablets by mouth once a day 4)  Hydrochlorothiazide 25 Mg Tabs (Hydrochlorothiazide) .... Take 1 tablet by mouth once a day - must make and keep  appt for additional refills 5)  Norvasc 10 Mg Tabs (Amlodipine besylate) .... Take 1 tablet by mouth once a day 6)  Not On Beta Blocker Due To Bradycardia  .... Per dr. Donnie Aho (cardiologist) 7)  Vitamin D3 1000 Unit Tabs (Cholecalciferol) .Marland Kitchen.. 1 tablet by mouth daily after finishing 8 week repletion 8)  Femara 2.5 Mg Tabs (Letrozole) .... Once daily 9)  Nitrolingual 0.4 Mg/spray Soln (Nitroglycerin) .... As needed 10)  Crestor 40 Mg Tabs (Rosuvastatin calcium) .Marland Kitchen.. 1 tab by mouth at bedtime for  cholesterol 11)  Benicar 40 Mg Tabs (Olmesartan medoxomil) .Marland Kitchen.. 1 tab by mouth daily for blood pressure 12)  Acetaminophen 500 Mg Tabs (Acetaminophen) .... 2 tablets by mouth three times a day as needed for your hands  Patient Instructions: 1)  It was nice to see you today. 2)  For your diabetes, start taking 2 GLUCOTROL (glipizide) tablets daily. 3)  Check your sugars two times a day for a week and call me with your sugars [phone]).   4)  For your hands, try the wrist splints I prescribed you at night and when you are doing housework. 5)  Try tylenol for your finger pain. 6)  Please schedule a follow-up appointment in 1 month for diabetes and hand pain. Prescriptions: GLUCOTROL XL 10 MG TB24 (GLIPIZIDE) Take 2 tablets by mouth once a day  #60 x 6   Entered and Authorized by:   Asher Muir MD   Signed by:   Asher Muir MD on 02/14/2010   Method used:   [facility] to        [facility] [address] [phone]* (retail)       [address]       [place], [place]  [address]       Ph: [phone] or [phone]       Fax: [phone]   RxID:   [id number]   Laboratory Results   Blood Tests   Date/Time Received: Feb 14, 2010 1:53 PM  Date/Time Reported: Feb 14, 2010 2:12 PM   HGBA1C: 8.0%   (Normal Range: Non-Diabetic - 3-6%   Control Diabetic - 6-8%)  Comments: ...........test performed by...........Marland KitchenTerese Door, CMA     Prevention & Chronic  Care Immunizations   Influenza vaccine: Fluvax MCR  (08/24/2009)   Influenza vaccine due: 07/24/2009    Tetanus booster: 03/13/2006: Done.   Tetanus booster due: 03/13/2016    Pneumococcal vaccine: Next in 10 yrs  (11/14/1999)   Pneumococcal vaccine due: 11/13/2004    H. zoster vaccine: Not documented  Colorectal Screening   Hemoccult: Done.  (05/13/2006)   Hemoccult due: Not Indicated    Colonoscopy: Results: Polyps.    (06/26/2009)   Colonoscopy action/deferral: GI Referral  (06/08/2009)   Colonoscopy due: 06/2014  Other Screening   Pap smear: NEGATIVE FOR INTRAEPITHELIAL LESIONS OR MALIGNANCY.  (06/27/2009)   Pap smear due: 06/28/2011    Mammogram: Poss R breast mass, L breast normal - further imaging pending  (05/03/2008)   Mammogram due: 05/03/2009    DXA bone density scan: Osteopenia  (05/02/2008)   DXA scan due: 05/02/2010    Smoking status: never  (02/14/2010)  Diabetes Mellitus   HgbA1C: 8.0  (02/14/2010)   Hemoglobin A1C due: 02/21/2009    Eye exam: No diabetic retinopathy.     (12/14/2009)   Eye exam due: 12/2010    Foot exam: yes  (12/19/2008)   High risk foot: Not documented   Foot care education: completed  (08/24/2008)   Foot exam due: 02/14/2009    Urine microalbumin/creatinine ratio: Not documented   Urine microalbumin/cr due: 04/18/2009    Diabetes flowsheet reviewed?: Yes   Progress toward A1C goal: Deteriorated    Stage of readiness to change (diabetes management): Action  Lipids   Total Cholesterol: 180  (04/17/2008)   Lipid panel action/deferral: LDL Direct Ordered   LDL: 756  (04/17/2008)   LDL Direct: 134  (06/08/2009)   HDL: 53  (04/17/2008)   Triglycerides:  86  (04/17/2008)    SGOT (AST): 15  (06/08/2009)   BMP action: Ordered   SGPT (ALT): 11  (06/08/2009)   Alkaline phosphatase: 86  (06/08/2009)   Total bilirubin: 0.2  (06/08/2009)    Lipid flowsheet reviewed?: Yes   Progress toward LDL goal: Unchanged    Stage of  readiness to change (lipid management): Action  Hypertension   Last Blood Pressure: 130 / 77  (02/14/2010)   Serum creatinine: 0.95  (06/08/2009)   BMP action: Ordered   Serum potassium 3.9  (06/08/2009)    Hypertension flowsheet reviewed?: Yes   Progress toward BP goal: At goal  Self-Management Support :   Personal Goals (by the next clinic visit) :     Personal A1C goal: 7  (06/08/2009)     Personal blood pressure goal: 130/80  (06/08/2009)     Personal LDL goal: 100  (06/08/2009)    Diabetes self-management support: Copy of home glucose meter record, CBG self-monitoring log, Written self-care plan  (02/14/2010)   Diabetes care plan printed    Hypertension self-management support: Written self-care plan  (06/27/2009)    Lipid self-management support: Lipid monitoring log, Written self-care plan  (02/14/2010)   Lipid self-care plan printed.

## 2010-11-12 NOTE — Letter (Signed)
Summary: Generic Letter  [facility]  [address]   [place], [place] [address]   Phone: [phone]  Fax: [phone]    02/17/2010  Dana Coleman [address] [place], [place]  [address]  Dear [name],  I just wanted to let you know that your cholesterol is fabulous!  Keep up the good work.  Please call me if you have any questions or concerns.  I look forward to seeing you at your next appointment.  Also, please call the office at your convenience to schedule an appointment with Dr. Gerilyn Pilgrim our nutritionist if you have not already done so.          Sincerely,   Asher Muir MD  Appended Document: Generic Letter mailed.

## 2010-11-12 NOTE — Progress Notes (Signed)
  Phone Note Other Incoming   Summary of Call: received note from patient with sugars.  called pt to discuss sugars.  see CPOE note below Initial call taken by: Asher Muir MD,  Mar 01, 2010 1:17 PM    New/Updated Medications: ACTOS 45 MG TABS (PIOGLITAZONE HCL) 1 tab by mouth daily for diabetes Prescriptions: ACTOS 45 MG TABS (PIOGLITAZONE HCL) 1 tab by mouth daily for diabetes  #31 x 6   Entered and Authorized by:   Asher Muir MD   Signed by:   Asher Muir MD on 03/01/2010   Method used:   [facility] to        [facility] [address] [phone]* (retail)       [address]       [place], [place]  [address]       Ph: [phone] or [phone]       Fax: [phone]   RxID:   [id number]    Impression & Recommendations:  Problem # 1:  DIABETES MELLITUS II, UNCOMPLICATED (ICD-250.00) Assessment Improved pt brought in list of sugars after increasing glucotrol.  fasting range from 115-204. evning sugars range from 152-230.  talked with pt on telephone.  Plan:  increase actos to max dose of 45.  pt to call if having low sugars.  follow up as previously instructed.  pt agrees to plan Her updated medication list for this problem includes:    Actos 45 Mg Tabs (Pioglitazone hcl) .Marland Kitchen... 1 tab by mouth daily for diabetes    Bayer Childrens Aspirin 81 Mg Chew (Aspirin) .Marland Kitchen... Take 1 tablet by mouth once a day    Glucotrol Xl 10 Mg Tb24 (Glipizide) .Marland Kitchen... Take 2 tablets by mouth once a day    Benicar 40 Mg Tabs (Olmesartan medoxomil) .Marland Kitchen... 1 tab by mouth daily for blood pressure  Complete Medication List: 1)  Actos 45 Mg Tabs (Pioglitazone hcl) .Marland Kitchen.. 1 tab by mouth daily for diabetes 2)  Bayer Childrens Aspirin 81 Mg Chew (Aspirin) .... Take 1 tablet by mouth once a day 3)  Glucotrol Xl 10 Mg Tb24 (Glipizide) .... Take 2 tablets by mouth once a day 4)  Hydrochlorothiazide 25 Mg Tabs (Hydrochlorothiazide) .... Take 1 tablet by mouth once a day - must  make and keep appt for additional refills 5)  Norvasc 10 Mg Tabs (Amlodipine besylate) .... Take 1 tablet by mouth once a day 6)  Not On Beta Blocker Due To Bradycardia  .... Per dr. Donnie Aho (cardiologist) 7)  Vitamin D3 1000 Unit Tabs (Cholecalciferol) .Marland Kitchen.. 1 tablet by mouth daily after finishing 8 week repletion 8)  Femara 2.5 Mg Tabs (Letrozole) .... Once daily 9)  Nitrolingual 0.4 Mg/spray Soln (Nitroglycerin) .... As needed 10)  Crestor 40 Mg Tabs (Rosuvastatin calcium) .Marland Kitchen.. 1 tab by mouth at bedtime for cholesterol 11)  Benicar 40 Mg Tabs (Olmesartan medoxomil) .Marland Kitchen.. 1 tab by mouth daily for blood pressure 12)  Acetaminophen 500 Mg Tabs (Acetaminophen) .... 2 tablets by mouth three times a day as needed for your hands 13)  One Touch Ultra Glucose Monitor  .... Check blood sugars twice daily 14)  One Touch Ultra Glucose Strips  .... Check blood sugars twice daily 15)  Lancets Misc (Lancets) .... Use as directed

## 2010-11-12 NOTE — Letter (Signed)
Summary: Generic Letter  [facility]  [address]   [place], [place] [address]   Phone: [phone]  Fax: [phone]    01/22/2010  Dana Coleman [address] [place], [place]  [address]  Dear Ms. KLUTTS,    You have not been seen in our office in some time.  You are due to recheck your blood pressure and diabetes.  Please call [phone] and schedule an office visit.  I look forward to seeing you.        Sincerely,   Asher Muir MD  Appended Document: Generic Letter mailed.

## 2010-11-12 NOTE — Assessment & Plan Note (Signed)
Summary: meet new doc/f/u dm(resch'd from 11/1)/[facility]   Vital Signs:  Patient profile:   70 year old female Weight:      153 pounds Temp:     99 degrees F oral Pulse rate:   73 / minute Pulse rhythm:   regular BP sitting:   130 / 82  (left arm) Cuff size:   regular  Vitals Entered By: Loralee Pacas CMA (August 15, 2010 2:02 PM)  Primary Care Provider:  Delbert Harness MD   History of Present Illness: Dana Coleman here to meet new MD  trigger finger:  left hand with "catching" x sevreal months with her 4th digits getting "stuck" now with nodule on palm. occaisionally left middle digit gets stuck as well.  So pain when this happens.  no unmbne, tingling.  no loss of strength.  DIABETES Meds: Actos, Glipizde, Metformin Taking and tolerating? yes Blood sugars: 80-125 Hypoglycemic symptoms: no Visual problems:no Monitoring feet: yes Numbness/Tingling: no Last eye exam: March 2011 Last A1c: 8.0 Flu/PNA vaccines? Needs flu this   HYPERTENSION Meds: Taking and tolerating? yes Home BP's: no Chest Pain: no Dyspnea: no     Current Medications (verified): 1)  Actos 45 Mg Tabs (Pioglitazone Hcl) .Marland Kitchen.. 1 Tab By Mouth Daily For Diabetes 2)  Bayer Childrens Aspirin 81 Mg Chew (Aspirin) .... Take 1 Tablet By Mouth Once A Day 3)  Glucotrol Xl 10 Mg Tb24 (Glipizide) .... Take 2 Tablets By Mouth Once A Day 4)  Hydrochlorothiazide 25 Mg Tabs (Hydrochlorothiazide) .... Take 1 Tablet By Mouth Once A Day - Must Make and Keep Appt For Additional Refills 5)  Norvasc 10 Mg Tabs (Amlodipine Besylate) .... Take 1 Tablet By Mouth Once A Day 6)  Not On Beta Blocker Due To Bradycardia .... Per Dr. Donnie Aho (Cardiologist) 7)  Vitamin D3 1000 Unit Tabs (Cholecalciferol) .Marland Kitchen.. 1 Tablet By Mouth Daily After Finishing 8 Week Repletion 8)  Femara 2.5 Mg Tabs (Letrozole) .... Once Daily 9)  Nitrolingual 0.4 Mg/spray Soln (Nitroglycerin) .... As Needed 10)  Crestor 40 Mg Tabs (Rosuvastatin Calcium) .Marland Kitchen.. 1 Tab By  Mouth At Bedtime For Cholesterol 11)  Benicar 40 Mg Tabs (Olmesartan Medoxomil) .Marland Kitchen.. 1 Tab By Mouth Daily For Blood Pressure 12)  Acetaminophen 500 Mg Tabs (Acetaminophen) .... 2 Tablets By Mouth Three Times A Day As Needed For Your Hands 13)  One Touch Ultra Glucose Monitor .... Check Blood Sugars Twice Daily 14)  One Touch Ultra Glucose Strips .... Check Blood Sugars Twice Daily 15)  Lancets  Misc (Lancets) .... Use As Directed 16)  Metformin Hcl 500 Mg Tabs (Metformin Hcl) .Marland Kitchen.. 1 Tab By Mouth Daily For 2 Weeks; Then 1 Tab By Mouth Two Times A Day  Past History:  Past Surgical History: Last updated: 2009/07/27 cath 11/24/02 - PTCA/stent RCA, EF 50%, mild hypokinesis inf. Wall  cold and hot nodule on RAIU - R thyroid lobectomy-benign Hurthle cell neoplasm-nl TSH postop - 03/14/2003,  neck cyst removal - 07/92 Hx Cervical cancer s/p radiation tx - 1976, uterus `snipped` 1970s, s/p BSO?   lumpectomy 2009  Family History: Last updated: Jul 27, 2009 Daughter--DM F--MI 46 M died MI 59 No CA,CVA Daughter - Nephrotic Syndrome   Social History: Last updated: 07/27/09 Lives alone.  Has 3 daughters in [place], 1 daughter in [place], 1 son in [place].     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 No tobacco, no EtOH, no drugs.  Past Medical History: Mixed urinary incontinence ( Dr. Annabell Howells) -  s/p Urodynamics and Therapy with improvement Hx of cercical CA with radiation therapy in her 30s - no cervix, but gets paps of vaginal cuff Acute MI (inferior) 11/24/02 with RCA stent placement - Dr. Donnie Aho (Cardiologist) -->sees him once a yr    - Treadmill Cardiolyte in 11/05    - Treadmill Cardiolyte in 1/09 (results unk) breast cancer s/p radiation diagnosed 2009.  currently on femara; Dr Donnie Coffin PMH-FH-SH reviewed-no changes except otherwise noted  Review of Systems      See HPI  Physical Exam  General:  Well-developed,well-nourished,in no acute distress; alert,appropriate and cooperative  throughout examination Lungs:  Clear, normal effort Heart:  RRR Abdomen:  Bowel sounds positive,abdomen soft and non-tender without masses, organomegaly or hernias noted. Extremities:  no LE edema.  left hand with palmar nodule at base of 4th digit.  unable to fully flex finger- afte severla repititions, catches.   Impression & Recommendations:  Problem # 1:  DIABETES MELLITUS II, UNCOMPLICATED (ICD-250.00) A1C 7.0 today.  no changes.  Discussed Therapeutic lifestyle changes. follow-up in 3months  Her updated medication list for this problem includes:    Actos 45 Mg Tabs (Pioglitazone hcl) .Marland Kitchen... 1 tab by mouth daily for diabetes    Bayer Childrens Aspirin 81 Mg Chew (Aspirin) .Marland Kitchen... Take 1 tablet by mouth once a day    Glucotrol Xl 10 Mg Tb24 (Glipizide) .Marland Kitchen... Take 2 tablets by mouth once a day    Benicar 40 Mg Tabs (Olmesartan medoxomil) .Marland Kitchen... 1 tab by mouth daily for blood pressure    Metformin Hcl 500 Mg Tabs (Metformin hcl) .Marland Kitchen... 1 tab by mouth daily for 2 weeks; then 1 tab by mouth two times a day  Orders: A1C-[facility] (40102) [facility]- Est  Level 4 (72536)  Labs Reviewed: Creat: 0.92 (04/09/2010)   Microalbumin: trace (04/18/2008)  Last Eye Exam: No diabetic retinopathy.    (12/14/2009) Reviewed HgBA1c results: 7.0 (08/15/2010)  8.0 (02/14/2010)  Problem # 2:  TRIGGER FINGER (ICD-727.03)  right hand dominant, most affected on left hand.  conservatiev tx with tylenol, massage, passive stretching, rest.  Patient to return for possibel injection if worsens.  Orders: [facility]- Est  Level 4 (64403)  Problem # 3:  HYPERTENSION, BENIGN SYSTEMIC (ICD-401.1)  at goal  Her updated medication list for this problem includes:    Hydrochlorothiazide 25 Mg Tabs (Hydrochlorothiazide) .Marland Kitchen... Take 1 tablet by mouth once a day - must make and keep appt for additional refills    Norvasc 10 Mg Tabs (Amlodipine besylate) .Marland Kitchen... Take 1 tablet by mouth once a day    Benicar 40 Mg Tabs (Olmesartan  medoxomil) .Marland Kitchen... 1 tab by mouth daily for blood pressure  BP today: 130/82 Prior BP: 115/Dana (04/09/2010)  Labs Reviewed: K+: 4.2 (04/09/2010) Creat: : 0.92 (04/09/2010)   Chol: 180 (04/17/2008)   HDL: 53 (04/17/2008)   LDL: 110 (04/17/2008)   TG: 86 (04/17/2008)  Orders: [facility]- Est  Level 4 (47425)  Problem # 4:  OSTEOPENIA (ICD-733.90)  Per patient- her oncologist rechecked a DEXA this year and says she does not need any other medication other than Vit D.  Her updated medication list for this problem includes:    Vitamin D3 1000 Unit Tabs (Cholecalciferol) .Marland Kitchen... 1 tablet by mouth daily after finishing 8 week repletion  Labs Reviewed: SGOT: 17 (04/09/2010)   SGPT: 13 (04/09/2010)   HDL:53 (04/17/2008)  LDL:110 (04/17/2008)  Chol:180 (04/17/2008)  Trig:86 (04/17/2008)  Orders: [facility]- Est  Level 4 (95638)  Problem # 5:  Preventive Health Care (ICD-V70.0) Discussed zostavax.  WIll get PNA and flu today.  UTD on DEXA per pt.  undergoing tx for breast ca.  UTD on pap.  Complete Medication List: 1)  Actos 45 Mg Tabs (Pioglitazone hcl) .Marland Kitchen.. 1 tab by mouth daily for diabetes 2)  Bayer Childrens Aspirin 81 Mg Chew (Aspirin) .... Take 1 tablet by mouth once a day 3)  Glucotrol Xl 10 Mg Tb24 (Glipizide) .... Take 2 tablets by mouth once a day 4)  Hydrochlorothiazide 25 Mg Tabs (Hydrochlorothiazide) .... Take 1 tablet by mouth once a day - must make and keep appt for additional refills 5)  Norvasc 10 Mg Tabs (Amlodipine besylate) .... Take 1 tablet by mouth once a day 6)  Not On Beta Blocker Due To Bradycardia  .... Per dr. Donnie Aho (cardiologist) 7)  Vitamin D3 1000 Unit Tabs (Cholecalciferol) .Marland Kitchen.. 1 tablet by mouth daily after finishing 8 week repletion 8)  Femara 2.5 Mg Tabs (Letrozole) .... Once daily 9)  Nitrolingual 0.4 Mg/spray Soln (Nitroglycerin) .... As needed 10)  Crestor 40 Mg Tabs (Rosuvastatin calcium) .Marland Kitchen.. 1 tab by mouth at bedtime for cholesterol 11)  Benicar 40 Mg Tabs  (Olmesartan medoxomil) .Marland Kitchen.. 1 tab by mouth daily for blood pressure 12)  Acetaminophen 500 Mg Tabs (Acetaminophen) .... 2 tablets by mouth three times a day as needed for your hands 13)  One Touch Ultra Glucose Monitor  .... Check blood sugars twice daily 14)  One Touch Ultra Glucose Strips  .... Check blood sugars twice daily 15)  Lancets Misc (Lancets) .... Use as directed 16)  Metformin Hcl 500 Mg Tabs (Metformin hcl) .Marland Kitchen.. 1 tab by mouth daily for 2 weeks; then 1 tab by mouth two times a day  Other Orders: Influenza Vaccine NON MCR (16109) Pneumococcal Vaccine (60454) Admin 1st Vaccine (09811)  Patient Instructions: 1)  You have a trigger finger- if it continue to bother you, please make follow-up appointmetn and we can inject it or we can refer you to sports medicine. 2)  Follow-up in 3 months or sooner if needed.   Orders Added: 1)  A1C-[facility] [83036] 2)  Influenza Vaccine NON MCR [00028] 3)  Pneumococcal Vaccine [90732] 4)  Admin 1st Vaccine [90471] 5)  [facility]- Est  Level 4 [91478]   Immunizations Administered:  Influenza Vaccine # 1:    Vaccine Type: Fluvax Non-MCR    Site: right deltoid    Mfr: GlaxoSmithKline    Dose: 0.5 ml    Route: IM    Given by: Loralee Pacas CMA    Exp. Date: 04/09/2011    Lot #: [id number]    VIS given: 05/07/10 version given August 15, 2010.  Pneumonia Vaccine:    Vaccine Type: Pneumovax    Site: left deltoid    Mfr: Merck    Dose: 0.5 ml    Route: IM    Given by: Loralee Pacas CMA    Exp. Date: 02/04/2012    Lot #: [id number]    VIS given: 09/17/09 version given August 15, 2010.  Flu Vaccine Consent Questions:    Do you have a history of severe allergic reactions to this vaccine? no    Any prior history of allergic reactions to egg and/or gelatin? no    Do you have a sensitivity to the preservative Thimersol? no    Do you have a past history of Guillan-Barre Syndrome? no    Do you currently have an acute febrile illness? no  Have you  ever had a severe reaction to latex? no    Vaccine information given and explained to patient? yes    Are you currently pregnant? no   Immunizations Administered:  Influenza Vaccine # 1:    Vaccine Type: Fluvax Non-MCR    Site: right deltoid    Mfr: GlaxoSmithKline    Dose: 0.5 ml    Route: IM    Given by: Loralee Pacas CMA    Exp. Date: 04/09/2011    Lot #: [id number]    VIS given: 05/07/10 version given August 15, 2010.  Pneumonia Vaccine:    Vaccine Type: Pneumovax    Site: left deltoid    Mfr: Merck    Dose: 0.5 ml    Route: IM    Given by: Loralee Pacas CMA    Exp. Date: 02/04/2012    Lot #: [id number]    VIS given: 09/17/09 version given August 15, 2010.   Prevention & Chronic Care Immunizations   Influenza vaccine: Fluvax Non-MCR  (08/15/2010)   Influenza vaccine due: 07/24/2009    Tetanus booster: 03/13/2006: Done.   Tetanus booster due: 03/13/2016    Pneumococcal vaccine: Pneumovax  (08/15/2010)   Pneumococcal vaccine due: 11/13/2004    H. zoster vaccine: Not documented  Colorectal Screening   Hemoccult: Done.  (05/13/2006)   Hemoccult due: Not Indicated    Colonoscopy: Results: Polyps.    (06/26/2009)   Colonoscopy action/deferral: GI Referral  (06/08/2009)   Colonoscopy due: 06/2014  Other Screening   Pap smear: NEGATIVE FOR INTRAEPITHELIAL LESIONS OR MALIGNANCY.  (06/27/2009)   Pap smear due: 06/28/2011    Mammogram: Poss R breast mass, L breast normal - further imaging pending  (05/03/2008)   Mammogram due: 05/03/2009    DXA bone density scan: Osteopenia  (05/02/2008)   DXA scan due: 05/02/2010    Smoking status: never  (04/09/2010)  Diabetes Mellitus   HgbA1C: 7.0  (08/15/2010)   Hemoglobin A1C due: 02/21/2009    Eye exam: No diabetic retinopathy.     (12/14/2009)   Eye exam due: 12/2010    Foot exam: yes  (12/19/2008)   High risk foot: Not documented   Foot care education: completed  (08/24/2008)   Foot exam due: 02/14/2009     Urine microalbumin/creatinine ratio: Not documented   Urine microalbumin/cr due: 04/18/2009    Diabetes flowsheet reviewed?: Yes  Lipids   Total Cholesterol: 180  (04/17/2008)   Lipid panel action/deferral: LDL Direct Ordered   LDL: 098  (04/17/2008)   LDL Direct: 67  (02/14/2010)   HDL: 53  (04/17/2008)   Triglycerides: 86  (04/17/2008)    SGOT (AST): 17  (04/09/2010)   BMP action: Ordered   SGPT (ALT): 13  (04/09/2010)   Alkaline phosphatase: 72  (04/09/2010)   Total bilirubin: 0.2  (04/09/2010)  Hypertension   Last Blood Pressure: 130 / 82  (08/15/2010)   Serum creatinine: 0.92  (04/09/2010)   BMP action: Ordered   Serum potassium 4.2  (04/09/2010)  Self-Management Support :   Personal Goals (by the next clinic visit) :     Personal A1C goal: 7  (06/08/2009)     Personal blood pressure goal: 130/80  (06/08/2009)     Personal LDL goal: 100  (06/08/2009)    Diabetes self-management support: Copy of home glucose meter record, CBG self-monitoring log, Written self-care plan  (04/09/2010)    Hypertension self-management support: Written self-care plan  (06/27/2009)    Lipid self-management support: Lipid monitoring  log, Written self-care plan  (02/14/2010)    Nursing Instructions: Give Flu vaccine today Give Pneumovax today     Laboratory Results   Blood Tests   Date/Time Received: August 15, 2010 2:07 PM  Date/Time Reported: August 15, 2010 2:22 PM   HGBA1C: 7.0%   (Normal Range: Non-Diabetic - 3-6%   Control Diabetic - 6-8%)  Comments: ...............test performed by......Marland KitchenBonnie A. [place], MLS (ASCP)cm

## 2010-11-29 ENCOUNTER — Other Ambulatory Visit: Payer: Self-pay | Admitting: Family Medicine

## 2010-11-29 MED ORDER — GLIPIZIDE ER 10 MG PO TB24: 20.0000 mg | ORAL_TABLET | Freq: Every day | ORAL | Status: DC

## 2010-11-29 MED ORDER — HYDROCHLOROTHIAZIDE 25 MG PO TABS: 25.0000 mg | ORAL_TABLET | Freq: Every day | ORAL | Status: DC

## 2010-11-29 MED ORDER — OLMESARTAN MEDOXOMIL 40 MG PO TABS: 40.0000 mg | ORAL_TABLET | Freq: Every day | ORAL | Status: DC

## 2010-11-29 MED ORDER — METFORMIN HCL 500 MG PO TABS: 500.0000 mg | ORAL_TABLET | Freq: Two times a day (BID) | ORAL | Status: DC

## 2010-11-29 MED ORDER — AMLODIPINE BESYLATE 10 MG PO TABS: 10.0000 mg | ORAL_TABLET | Freq: Every day | ORAL | Status: DC

## 2010-11-29 MED ORDER — ROSUVASTATIN CALCIUM 40 MG PO TABS: 40.0000 mg | ORAL_TABLET | Freq: Every day | ORAL | Status: DC

## 2010-11-29 MED ORDER — PIOGLITAZONE HCL 45 MG PO TABS: 45.0000 mg | ORAL_TABLET | Freq: Every day | ORAL | Status: DC

## 2011-01-16 ENCOUNTER — Ambulatory Visit (INDEPENDENT_AMBULATORY_CARE_PROVIDER_SITE_OTHER): Payer: PRIVATE HEALTH INSURANCE | Admitting: Family Medicine

## 2011-01-16 ENCOUNTER — Encounter: Payer: Self-pay | Admitting: Family Medicine

## 2011-01-16 DIAGNOSIS — Z8639 Personal history of other endocrine, nutritional and metabolic disease: Secondary | ICD-10-CM

## 2011-01-16 DIAGNOSIS — C50919 Malignant neoplasm of unspecified site of unspecified female breast: Secondary | ICD-10-CM

## 2011-01-16 DIAGNOSIS — E119 Type 2 diabetes mellitus without complications: Secondary | ICD-10-CM

## 2011-01-16 DIAGNOSIS — I2510 Atherosclerotic heart disease of native coronary artery without angina pectoris: Secondary | ICD-10-CM

## 2011-01-16 DIAGNOSIS — Z862 Personal history of diseases of the blood and blood-forming organs and certain disorders involving the immune mechanism: Secondary | ICD-10-CM

## 2011-01-16 DIAGNOSIS — N393 Stress incontinence (female) (male): Secondary | ICD-10-CM

## 2011-01-16 DIAGNOSIS — Z8541 Personal history of malignant neoplasm of cervix uteri: Secondary | ICD-10-CM

## 2011-01-16 DIAGNOSIS — M899 Disorder of bone, unspecified: Secondary | ICD-10-CM

## 2011-01-16 DIAGNOSIS — E7800 Pure hypercholesterolemia, unspecified: Secondary | ICD-10-CM

## 2011-01-16 DIAGNOSIS — I10 Essential (primary) hypertension: Secondary | ICD-10-CM

## 2011-01-16 LAB — COMPREHENSIVE METABOLIC PANEL
ALT: 13 U/L (ref 0–35)
AST: 22 U/L (ref 0–37)
Alkaline Phosphatase: 85 U/L (ref 39–117)
Creat: 0.84 mg/dL (ref 0.40–1.20)
Sodium: 141 mEq/L (ref 135–145)
Total Bilirubin: 0.4 mg/dL (ref 0.3–1.2)
Total Protein: 7.5 g/dL (ref 6.0–8.3)

## 2011-01-16 LAB — LIPID PANEL
HDL: 55 mg/dL (ref >39)
LDL Cholesterol: 77 mg/dL (ref 0–99)
Total CHOL/HDL Ratio: 2.7 Ratio
Triglycerides: 94 mg/dL (ref <150)
VLDL: 19 mg/dL (ref 0–40)

## 2011-01-16 LAB — TSH: TSH: 0.826 u[IU]/mL (ref 0.350–4.500)

## 2011-01-16 MED ORDER — AMLODIPINE BESYLATE 10 MG PO TABS: 10.0000 mg | ORAL_TABLET | Freq: Every day | ORAL | Status: DC

## 2011-01-16 MED ORDER — ROSUVASTATIN CALCIUM 40 MG PO TABS: 40.0000 mg | ORAL_TABLET | Freq: Every day | ORAL | Status: DC

## 2011-01-16 MED ORDER — GLIPIZIDE ER 10 MG PO TB24: 20.0000 mg | ORAL_TABLET | Freq: Every day | ORAL | Status: DC

## 2011-01-16 MED ORDER — PIOGLITAZONE HCL 45 MG PO TABS: 45.0000 mg | ORAL_TABLET | Freq: Every day | ORAL | Status: DC

## 2011-01-16 MED ORDER — HYDROCHLOROTHIAZIDE 25 MG PO TABS: 25.0000 mg | ORAL_TABLET | Freq: Every day | ORAL | Status: DC

## 2011-01-16 MED ORDER — METFORMIN HCL 500 MG PO TABS: 500.0000 mg | ORAL_TABLET | Freq: Two times a day (BID) | ORAL | Status: DC

## 2011-01-16 MED ORDER — OLMESARTAN MEDOXOMIL 40 MG PO TABS: 40.0000 mg | ORAL_TABLET | Freq: Every day | ORAL | Status: DC

## 2011-01-16 NOTE — Assessment & Plan Note (Signed)
Patient not on calcium, she will disucuss at her oncology appt  Coming up.

## 2011-01-16 NOTE — Patient Instructions (Signed)
Consider taking 1200 mg of calcium per day, talk with Dr. Donnie Coffin about your last DEXA results. Add more exercise to your daily routine, consider safe places for walking like the park or mall. You are due for your pap smear. Follow-up for diabetes in 3-6 months

## 2011-01-16 NOTE — Progress Notes (Signed)
  Subjective:    Patient ID: Dana Coleman, female    DOB: 1941-08-21, 70 y.o.   MRN: [id number]  HPI DIABETES  Taking and tolerating: Actos, glipizide, metofrmin Fasting blood sugars:93-149  Hypoglycemic symptoms: no Visual problems: no Monitoring feet: yes Numbness/Tingling: no Last eye exam: March 2011 on record, states she went this year and was good. Diabetic Labs:  Lab Results  Component Value Date   HGBA1C 7.1 01/16/2011   HGBA1C 7.0 08/15/2010   HGBA1C 8.0 02/14/2010   Lab Results  Component Value Date   LDLCALC 110* 04/17/2008   CREATININE 0.97 08/02/2010   Last microalbumin: No results found for this basename: MICROALBUR, [id number]   HYPERTENSION  BP Readings from Last 3 Encounters:  01/16/11 130/74  08/15/10 130/82  04/09/10 115/74    Hypertension ROS: taking medications as instructed, no medication side effects noted, no TIA's, no chest pain on exertion, no dyspnea on exertion, no swelling of ankles and no intermittent claudication symptoms.   Marland Kitchen HYPERLIPIDEMIA  Diet: No particular.  Walking:  1/2 a mile once a week.  Says scared of dogs in her neighborhood.   Wt Readings from Last 3 Encounters:  01/16/11 150 lb (68.04 kg)  08/15/10 153 lb (69.4 kg)  04/09/10 151 lb 12.8 oz (68.856 kg)   ROS:  Denies RUQ pain, myalgias, or symptoms or coronary ischemia Lab Results  Component Value Date   LDLCALC 110* 04/17/2008   Lab Results  Component Value Date   CHOL 180 04/17/2008   Lab Results  Component Value Date   HDL 53 04/17/2008   Lab Results  Component Value Date   TRIG 86 04/17/2008   Lab Results  Component Value Date   ALT 18 08/02/2010   AST 22 08/02/2010   ALKPHOS 69 08/02/2010   BILITOT 0.3 08/02/2010             Review of Systems See HPI    Objective:   Physical Exam  Constitutional: She appears well-developed and well-nourished.  Neck: Neck supple. No thyromegaly present.  Cardiovascular: Normal rate, regular rhythm and normal  heart sounds.   No murmur heard. Pulmonary/Chest: Effort normal and breath sounds normal. No respiratory distress. She has no wheezes.  Abdominal: Soft. Bowel sounds are normal.  Musculoskeletal: She exhibits no edema.          Assessment & Plan:

## 2011-01-16 NOTE — Assessment & Plan Note (Signed)
Well controlled. No changes. 

## 2011-01-16 NOTE — Assessment & Plan Note (Signed)
Due for lipid panel.  History of good control.

## 2011-01-16 NOTE — Assessment & Plan Note (Signed)
Will check TSH annually 

## 2011-01-16 NOTE — Assessment & Plan Note (Signed)
Good control.  Urged continued increase in exercise.  No changes today.

## 2011-01-27 ENCOUNTER — Encounter: Payer: Self-pay | Admitting: Family Medicine

## 2011-02-25 NOTE — Op Note (Signed)
Dana Coleman, Dana Coleman            ACCOUNT NO.:  [id number]   MEDICAL RECORD NO.:  [id number]          PATIENT TYPE:  AMB   LOCATION:  [facility]                          FACILITY:  [facility]   PHYSICIAN:  Angelia Mould. Derrell Lolling, M.D.DATE OF BIRTH:  1941-05-25   DATE OF PROCEDURE:  06/12/2008  DATE OF DISCHARGE:                               OPERATIVE REPORT   PREOPERATIVE DIAGNOSIS:  Invasive cancer, right breast.   POSTOPERATIVE DIAGNOSIS:  Invasive cancer, right breast.   OPERATION PERFORMED:  1. Injection of blue dye, right breast.  2. Right axillary sentinel node mapping and biopsy.  3. Right partial mastectomy with needle localization.   SPECIMEN:  Mammogram.   SURGEON:  Haywood M. Derrell Lolling, MD   OPERATIVE INDICATIONS:  This is a 70 year old black female who had  screening mammograms recently which showed a 7-mm nodule in the right  breast at the 10 o'clock position, 8 cm from the nipple.  Ultrasound-  guided biopsy was performed.  This revealed an invasive ductal carcinoma  which was positive for estrogen and progesterone receptors and was  negative for HER-2/neu.  She has had an MRI which shows this to be a  solitary process.  We discussed her situation with her as well as at the  Breast Multidisciplinary Conference.  Her clinical staging preop is T1B,  N0.  She was interested in breast conservation.  I felt she was an  appropriate candidate for this.  This was all discussed as an outpatient  and she is brought to the operating room electively.   OPERATIVE TECHNIQUE:  The patient went to the [facility] and had a needle wire localization performed by Dr. Deboraha Sprang.  This wire was inserted from the lateral-to-medial direction and with  very nearly through the marker clip at the 10 o'clock position of the  right breast.  The patient was then brought to [facility].  Radionuclide was injected into the right breast by the Nuclear Medicine  technician.  After about  10 minutes, the patient was taken to the  operating room.  General anesthesia was induced.   The right breast periareolar area was prepped with alcohol and I then  injected 5 mL of blue dye in the subareolar area.  This was 2 mL of  methylene blue mixed with 3 mL of saline.  The breast was massaged for 5  minutes.  The breast, the chest wall, right axilla, and right shoulder  were then prepped and draped in a sterile fashion.  Marcaine 0.5% with  epinephrine was used as a local infiltration anesthetic.  The patient  was identified as correct patient, correct procedure, and correct site.  Intravenous antibiotics were given.   Using the NeoProbe, I identified an area of focal radioactivity in the  right axilla.  This was just below the hairline.  Transverse incision  was made at the hairline.  Using the NeoProbe as a guide, we dissected  down through the subcutaneous tissue and then incised the clavipectoral  fascia and then identified a single very blue, very hot lymph node.  Once this node was  removed, there was no other radioactivity.  Dr. Berneta Levins in Pathology did imprint cytology on this node and called and  told me that there was no evidence for cancer cells and the node was  negative on imprint cytology.  The right axilla was irrigated with  saline.  Hemostasis was excellent and achieved electrocautery.  The  deeper tissues were closed with interrupted sutures of 3-0 Vicryl and  the skin closed with running subcuticular suture of 4-0 Monocryl.   Attention was directed to the right breast.  I marked a curved incision  in the upper outer quadrant of the right breast.  This incision was in  general parallel to the areolar margin.  This incision was oriented so  as to be over the tumor.  The incision was made and dissection was  carried down into the breast tissue and around the localizing wire  widely.  Posteriorly, we got all the way to the pectoralis fascia.  The  specimen was  then removed and carefully marked with the 6-color margin  marker kit.  A specimen mammogram was performed and it was read by Dr.  Norva Pavlov.  She stated that it looked good and the marker clip was  in the center of the specimen.  The specimen was sent fresh to Pathology  for routine histology.  The right breast incision was irrigated with  saline.  Hemostasis was excellent.  The deeper breast tissues were  closed with multiple interrupted sutures of 3-0 Vicryl and the skin  closed with running subcuticular suture of 3-0 Monocryl.  Both the  axillary and the breast incisions were further covered with Dermabond.  Clean bandages and ice pack were placed.  The patient tolerated the  procedure well and was taken to the recovery room in stable condition.  Estimated blood loss was about 20 mL.  Complications none.  Sponge,  needle, and instrument counts were correct.      Angelia Mould. Derrell Lolling, M.D.  Electronically Signed     HMI/MEDQ  D:  06/12/2008  T:  06/13/2008  Job:  [id number]   cc:   Georga Hacking, M.D.  [facility]

## 2011-02-28 NOTE — Cardiovascular Report (Signed)
NAME:  Dana Coleman, Dana Coleman                      ACCOUNT NO.:  [id number]   MEDICAL RECORD NO.:  [id number]                   PATIENT TYPE:  INP   LOCATION:  2929                                 FACILITY:  [facility]   PHYSICIAN:  W. Ashley Royalty., M.D.         DATE OF BIRTH:  Jan 23, 1941   DATE OF PROCEDURE:  11/24/2002  DATE OF DISCHARGE:                              CARDIAC CATHETERIZATION   PROCEDURE PERFORMED:  1. Left heart catheterization with coronary angiograms.  2. Left ventriculogram.  3. Insertion of temporary pacemaker.  4. Stent of proximal right coronary artery.   CARDIOLOGIST:  Georga Hacking, M.D.   HISTORY:  The patient is a 70 year old woman with hypertension and diabetes  who presented to the emergency room with an acute inferior infarction. She  had significant bradycardia on arrival to the emergency room.   DESCRIPTION OF PROCEDURE:  The patient was brought to the catheterization  laboratory from the emergency room.  She was prepped and draped in the usual  manner.  After Xylocaine anesthesia a 6 French sheath was placed in the  right femoral artery percutaneously.  Angiograms were made using 6 French  catheters and a 25 mL ventriculogram was performed.  She was demonstrated to  have severe stenosis in the proximal right coronary artery with a heavy  thrombus burden in the distal vessel, which was occluded in the vessel.  Because of the heavy thrombus burden she was started on Integrelin.  ACT was  measured and was 266.  She was mildly hypotensive and a 6 [place] temporary  pacing catheter was placed in the right ventricular apex because of  bradycardia.  This was done through a 6 [place] venous sheath.  I initially  used a 7 [place] guiding catheter and placed a 0.014 HTF-J guide wire  into the distal vessel.  I was unable to pass an Export catheter through the  stenosis and the stenosis and the stenosis was predilated with a 2.5 x 15 mm  CrossSail  balloon.  I then attempted to recross with the Export catheter,  but could not get it to cross a calcified bend proximally.  I then removed  the entire system and went in with an Amplatz left II 7 [place] guiding  catheter, which gave good back up.  The lesion in the distal vessel was  rewired with another HTF-J guide wire.  I, again, was unable to push the  position of the Export catheter beyond the proximal vessel and this was  abandoned.  The thrombus was noted to migrate more into the distal  posterolateral branch and reperfusion was established in the posterior  descending artery.  During this period of time the patient became  hypotensive and somewhat unresponsive and it is thought that the Amplatz  catheter was causing some aortic regurgitation and was repositioned with  improvement in the blood pressure.  The patient also required sedation with  1 mg of  Versed, but became somnolent and somewhat unresponsive, and this was  reversed with Mazicon 0.4 mg IV.   After the patient was stabilized I then proceeded with the stent through the  previous guiding catheter.  A 3.5 x 13 mm Zeta stent was deployed at 16  atmospheres in the proximal right coronary artery.  She had a good  angiographic result with this, but residual thrombus in the distal vessel.  As she had stabilized significantly by this point in time.  It was not felt  that further attempts could dissolve the thrombus distally would be  warranted in view of her marked instability prior to this case and the  distal location of the thrombus.  She was given intracoronary nitroglycerin  and became somewhat hypotensive with this, and responded to fluids and the  to the administration of dopamine.   The patient stabilized and was able to be taken to the CCU in stable  condition.    RESULTS:   HEMODYNAMIC DATA:  Aorta post contrast 99/53, post contrast 99/0-14.   ANGIOGRAPHIC DATA:  LEFT VENTRICULOGRAM:  Performed in the 30-degree  RAO  projection the aortic valve was normal.  The mitral valve was normal.  The  left ventricle appeared normal in size.  There was mild hypokinesis of the  inferior wall noted.  The estimated ejection fraction was 50%.   CORONARY ARTERIES:  Arise and distribute normally.  The proximal left main  and LAD were heavily calcified as was the proximal and mid right coronary  artery.   The left main coronary artery had a mild distal narrowing estimated at 20%.   Left anterior descending was calcified proximally with mild 30% proximal  narrowing and diffuse disease in the middle portion of the vessel.  There  was a severe 80% stenosis in the very distal vessel, which ends on the  anterior wall.  There were scattered irregularities in the diagonal branch.   Circumflex coronary artery had a large marginal branch.  There was moderate  40% midvessel stenosis and the large first marginal branch had a 50%  stenosis.   Right coronary artery:  Large vessel, which is dominant.  There was a severe  subsegmental 99% stenosis with thrombus in the proximal portion of the  vessel.  There was diffuse disease in the midportion and then there was a  large area of thrombus formation in the vessel, which was subtotally  occluded.  Post dilatation angiograms revealed 0% residual stenosis in the  stented area.  There was calcification and diffuse disease in the midportion  estimated at 40-50%.   The posterior descending artery was widely patent, but there was distal  disease estimated at 80% in the distal portion of this vessel.  The  continuation branch was patent, but the distal posterolateral branch was  occluded with thrombus, and the more proximal posterolateral branch also had  slow flow in it.   IMPRESSION:  1. Successful percutaneous transluminal coronary angioplasty and stenting of    the right coronary artery in the setting of acute myocardial infarction;     some residual thrombus noted in the  distal posterolateral branch.  2. Residual coronary disease involving the distal posterior descending     artery, the distal left anterior descending and moderate disease of the     circumflex.  3. Acute inferior infarction.    PLAN:  1. Go to CCU.  2. Continuing temporary pacemaker for now.  3. Continue dopamine and stabilize.  Darden Palmer., M.D.    WST/MEDQ  D:  11/24/2002  T:  11/25/2002  Job:  [id number]   cc:   [facility]

## 2011-02-28 NOTE — Discharge Summary (Signed)
NAME:  Dana Coleman, Dana Coleman                      ACCOUNT NO.:  [id number]   MEDICAL RECORD NO.:  [id number]                   PATIENT TYPE:  INP   LOCATION:  2023                                 FACILITY:  [facility]   PHYSICIAN:  W. Ashley Royalty., M.D.         DATE OF BIRTH:  July 04, 1941   DATE OF ADMISSION:  11/24/2002  DATE OF DISCHARGE:                                 DISCHARGE SUMMARY   FINAL DIAGNOSES:  1. Acute inferior myocardial infarction/initial episode.  2. Hypertensive heart disease.  3. Diabetes mellitus.  4. Hyperlipidemia.   PROCEDURE:  Cardiac catheterization emergent with angioplasty and stenting  of the right coronary artery.   HISTORY OF PRESENT ILLNESS:  The patient is a 70 year old female with a  history of hypertension and diabetes mellitus who had not felt well two days  prior to admission. She felt soreness in her chest. Had nausea and general  malaise. 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 She had the onset of anterior chest  pain after going to work at 10:00 a.m. She was found sitting next to a trash  can. EMS was called. She was bradycardiac in the ambulance with a heart rate  of 40 and was given Atropine and fluids. She was found to have an acute  inferior myocardial infarction and was admitted for treatment. Please see  the previously dictated history and physical for remainder of the details.   HOSPITAL COURSE:  The patient was taken emergently to the cardiac  catheterization lab. She was found to have distal stenosis in the LAD,  moderately severe circumflex stenosis, and she was demonstrated to have a  severe stenosis in the proximal heart with a heavy thrombus heard in the  distal vessel, which was occluded. We attempted to pass an export catheter  but were unable to pass this due to calcification of the vessel. The  proximal vessel was stented with a 3.5 x 13 mm Zeta Stent. She  had a  temporary Pacemaker inserted during the procedure and tolerated the  procedure well. She was moved to the CCU. While in the CCU, she remained  stable and had some increased shortness of breath and decreased O2  saturation. She had some temperature elevation and was treated for possible  aspiration. Chest x-ray showed mild congestive heart failure and left lower  lobe atelectasis. She was given Rocephin. She was seen by the [facility] for consultation of diabetic management. She had  improved slowly overall and her O2 SATs improved. She was seen by cardiac  rehabilitation and was ambulatory in the hall without difficulty. Sugars  came under better control. She was demonstrated to be moderately anemic and  she will be discharged on iron to continue her treatment as an outpatient.  She had no recurrent chest pain and was feeling well on the day of  admission.  BUN and creatinine remained stable. Chest x-ray showed no acute  disease.   LABORATORY DATA:  Lab data showed sodium of 138, potassium 3.7, glucose 208  on admission, BUN 13, creatinine 0.8. CPK rose to 2,673 with 276 units of  MB. Urine culture was no growth. CBC on admission showed hemoglobin and  hematocrit of 12.5 and 37.7 and dropped to 8.8 and 26.0. She is discharged  in improved condition at this time.   DISCHARGE MEDICATIONS:  Aspirin 81 mg daily, Plavix 75 mg daily, Lotensin 30  mg daily, Norvasc 10 mg daily, Zocor 40 mg daily, Glucotrol XL 10 mg daily,  Actos 15 mg daily, Nitroglycerin 1/150 SL as needed.    DISCHARGE INSTRUCTIONS:  She is to exercise regularly and I will see her in  follow-up in 1-2 weeks. She is to follow-up with Dr. Merilynn Finland at [facility] on December 21, 2002.                                               Darden Palmer., M.D.    WST/MEDQ  D:  11/30/2002  T:  11/30/2002  Job:  [id number]

## 2011-02-28 NOTE — H&P (Signed)
NAME:  Dana Coleman, Dana Coleman NO.:  [id number]   MEDICAL RECORD NO.:  [id number]                   PATIENT TYPE:  INP   LOCATION:  1825                                 FACILITY:  [facility]   PHYSICIAN:  W. Ashley Royalty., M.D.         DATE OF BIRTH:  14-Jun-1941   DATE OF ADMISSION:  11/24/2002  DATE OF DISCHARGE:                                HISTORY & PHYSICAL   REASON FOR ADMISSION:  Acute inferior infarction.   HISTORY:  This 70 year old black female has a history of hypertension and  diabetes.  She has not felt well for two days, feeling sore in her chest,  and having some nausea and a general feeling of malaise.  She works doing  domestic housework.  This morning, she was found by her employer, sitting in  a chair, complaining of feeling poorly.  She stated that she had the onset  of anterior chest pain shortly after getting to work around 10 a.m.  She was  found sitting next to a trash can, and EMS was called after her daughter  arrived.  She was found to be bradycardic in the ambulance with a heart rate  of 40 and was given atropine and some fluids, and was found to have an acute  inferior infarction.  She is admitted at this time for treatment of an acute  infarction and for possible catheterization.   PAST MEDICAL HISTORY:  Remarkable for hypertension and diabetes for  approximately two years.  There is no history of ulcers or GI bleeding.  Previous surgeries, none.   ALLERGIES:  None.   CURRENT MEDICATIONS:  1. Hydrochlorothiazide 25 mg daily.  2. Norvasc 10 mg daily.  3. Glucotrol XL 10 mg daily.  4. Lotensin 30 mg daily.   FAMILY HISTORY:  Mother died of MI at age 69.  Father died at age 43 of MI.  He has three sisters and one brother.   FAMILY HISTORY:  Positive for heart disease.   SOCIAL HISTORY:  She currently lives with a daughter.  She is a Astronomer.  She is a nonsmoker.  She drinks occasional alcohol.   REVIEW OF  SYSTEMS:  Somewhat sketchy as the patient is lethargic.  She  denies any history of stroke.  She denies any GI bleeding or recent surgery.  She does not have any significant edema.  Other than as noted above, the  remainder of the review of systems is unremarkable.   PHYSICAL EXAMINATION:  GENERAL:  She is an elderly, moderately obese black  female who is lethargic and falls asleep easily.  VITAL SIGNS:  Blood pressure 130/70, pulse currently 76 and irregular.  SKIN:  Warm and dry.  HEENT:  EOMI/PERRLA.  Skin is clear.  Pharynx is negative.  NECK:  Supple without masses.  No JVD, thyromegaly or bruits.  LUNGS:  Clear to auscultation and percussion.  CARDIAC:  Normal S1 and S2.  No S3.  ABDOMEN:  Soft, nontender.  PULSES:  Femoral pulses are 2+ without bruits.  Posterior tibial and  dorsalis pedis pulses are 2+.  There is no edema.   EKG:  A 12-lead EKG shows and acute inferior infarction.  Chest x-ray shows  a somewhat widened aorta.   IMPRESSION:  1. Acute inferior myocardial infarction -- time of onset 10 a.m.  2. Widened aorta, rule out aortic dissection.  3. Hypertension.  4. Diabetes.  5. Obesity.   RECOMMENDATIONS:  Discussed with the son and daughter.  The patient was  taken to the cardiac catheterization laboratory and will undergo acute  cardiac catheterization, possible angioplasty and stent.  The procedure was  discussed with the patient fully, including risk of MI, death, or CVA, and  the family understands.  She will be given heparin and aspirin.  Possible  temporary pacemaker because of bradycardia.                                               Darden Palmer., M.D.    WST/MEDQ  D:  11/24/2002  T:  11/24/2002  Job:  [id number]   cc:   [facility]

## 2011-02-28 NOTE — Op Note (Signed)
NAME:  Dana Coleman, Dana Coleman                      ACCOUNT NO.:  [id number]   MEDICAL RECORD NO.:  [id number]                   PATIENT TYPE:  AMB   LOCATION:  DAY                                  FACILITY:  [facility]   PHYSICIAN:  Angelia Mould. Derrell Lolling, M.D.             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   OPERATION PERFORMED:  Right thyroid lobectomy with frozen section.   SURGEON:  Angelia Mould. Derrell Lolling, M.D.   FIRST ASSISTANT:  Adolph Pollack, M.D.   OPERATIVE INDICATION:  This is a 70 year old African-American female who was  found to have decreased TSH level to 0.3, which is borderline low.  Thyroid  scan was performed, which showed a warm nodule in the lower pole of the left  gland measuring 8 mm in diameter and a cold nodule of 1.5 cm size in the  right lobe.  She has no symptoms of hyperthyroidism, and she has no  compressive symptoms in the neck.  Exam is unremarkable.  I felt that the  warm nodule in the left thyroid lobe was probably totally benign and not  causing any symptoms, but the cold nodule underwent fine needle aspiration  cytology, and the cytology reveals Hurthle cell neoplasm.  She was advised  that this may be a totally benign hyperplastic nodule with Hurthle cell  change, a benign neoplasm, or a malignant Hurthle cell cancer.  She was  brought to the operating room for a thyroidectomy.   OPERATIVE TECHNIQUE:  Following the induction of general endotracheal  anesthesia, the patient's arms were placed at the side and the neck was  extended, and the patient was placed in a slightly reversed Trendelenburg  position.  The neck was prepped and draped in a sterile fashion.  A curved  transverse Kocher incision was  made about 2 cm above the clavicles.  Dissection was carried down through the platysma muscles.  Skin and platysma  muscle flaps were raised superiorly and inferiorly with electrocautery.  Some venous bleeders were ligated with 3-0 silk ties.  A self-retaining  retractor was placed.  The strap muscles were divided in the midline and the  strap muscles were dissected off of the right and left thyroid lobe.  In the  left thyroid lobe there was a very tiny fullness in the inferior pole, and I  felt this was probably consistent with a warm nodule.  In the right lobe  there was about a 1.5 cm nodule in the mid- to upper gland.  We mobilized  the superior pole and the inferior pole of the thyroid gland.  The  dissection was kept in the capsule of the thyroid gland.  Vascular  structures  were controlled with metal clips and divided on the inferior  pole.  The superior pole was then isolated.  We isolated the superior  thyroid vessels and ligated the superior thyroid artery in continuity with 2-  0 silk ties.  A metal clip was also placed on the superior thyroid artery  for security and then these were divided.  Again we kept our dissection  right in the capsule of the thyroid gland.  We identified the superior  parathyroid gland as well as the inferior parathyroid gland, and these were  preserved.  We continued to mobilize the thyroid gland from medial to  lateral.  The middle thyroid vein and the inferior thyroid artery were  isolated, secured with metal clips, and divided.  We identified the  recurrent laryngeal nerve and then dissected the thyroid gland up off the  trachea.  The recurrent laryngeal nerve was preserved.  We carried the  dissection all the way across to the left side of the trachea and then  placed hemostats above and below on the isthmus and divided the thyroid  gland.   Frozen section was performed by Dr. Delila Spence, and he said that the frozen  section showed prominent Hurthle  cells consistent either with a hyperplastic  nodule or a Hurthle cell neoplasm.  There was no evidence of vascular  invasion or capsular invasion on the frozen section.  We felt that there was  no indication for left thyroid lobectomy.   We suture ligated the isthmus of the thyroid with suture ligatures of 3-0  Vicryl.  The wound was irrigated with saline.  We placed some Surgicel gauze  in the bed of the right thyroid lobe and after five minutes, it was  completely dry and there was no bleeding whatsoever.  The strap muscles were  closed in the midline with a running suture of 3-0 Vicryl.  The platysma  muscle was closed with interrupted sutures of 3-0 Vicryl.  The skin was  closed with a few skin staples and Steri-Strips.  Clean bandages were placed  and the patient taken to the recovery room in stable condition.  Estimated  blood loss was about 20 mL.  Complications:  None.  Sponge, needle, and  instrument counts were correct.                                                Angelia Mould. Derrell Lolling, M.D.    HMI/MEDQ  D:  04/07/2003  T:  04/07/2003  Job:  [id number]   cc:   Sibyl Parr. Fields, M.D.  [address] [place]  [place] [address]  Fax: [phone]

## 2011-03-01 ENCOUNTER — Other Ambulatory Visit: Payer: Self-pay | Admitting: Family Medicine

## 2011-03-02 NOTE — Telephone Encounter (Signed)
Refill request

## 2011-03-13 ENCOUNTER — Other Ambulatory Visit: Payer: Self-pay | Admitting: Oncology

## 2011-03-13 ENCOUNTER — Encounter (HOSPITAL_BASED_OUTPATIENT_CLINIC_OR_DEPARTMENT_OTHER): Payer: PRIVATE HEALTH INSURANCE | Admitting: Oncology

## 2011-03-13 DIAGNOSIS — Z170 Estrogen receptor positive status [ER+]: Secondary | ICD-10-CM

## 2011-03-13 DIAGNOSIS — C50419 Malignant neoplasm of upper-outer quadrant of unspecified female breast: Secondary | ICD-10-CM

## 2011-03-13 DIAGNOSIS — Z853 Personal history of malignant neoplasm of breast: Secondary | ICD-10-CM

## 2011-03-13 DIAGNOSIS — Z9889 Other specified postprocedural states: Secondary | ICD-10-CM

## 2011-03-13 LAB — CBC WITH DIFFERENTIAL/PLATELET
Basophils Absolute: 0 10*3/uL (ref 0.0–0.1)
Eosinophils Absolute: 0.1 10*3/uL (ref 0.0–0.5)
HGB: 11.5 g/dL — ABNORMAL LOW (ref 11.6–15.9)
MONO#: 0.6 10*3/uL (ref 0.1–0.9)
NEUT#: 5.5 10*3/uL (ref 1.5–6.5)
Platelets: 180 10*3/uL (ref 145–400)
RBC: 3.94 10*6/uL (ref 3.70–5.45)
RDW: 14.8 % — ABNORMAL HIGH (ref 11.2–14.5)
WBC: 8.2 10*3/uL (ref 3.9–10.3)

## 2011-03-14 LAB — CANCER ANTIGEN 27.29: CA 27.29: 18 U/mL (ref 0–39)

## 2011-03-14 LAB — COMPREHENSIVE METABOLIC PANEL
Albumin: 4.3 g/dL (ref 3.5–5.2)
BUN: 14 mg/dL (ref 6–23)
CO2: 24 mEq/L (ref 19–32)
Calcium: 9.7 mg/dL (ref 8.4–10.5)
Glucose, Bld: 102 mg/dL — ABNORMAL HIGH (ref 70–99)
Potassium: 4.2 mEq/L (ref 3.5–5.3)
Sodium: 141 mEq/L (ref 135–145)
Total Protein: 7.5 g/dL (ref 6.0–8.3)

## 2011-03-14 LAB — VITAMIN D 25 HYDROXY (VIT D DEFICIENCY, FRACTURES): Vit D, 25-Hydroxy: 53 ng/mL (ref 30–89)

## 2011-05-08 ENCOUNTER — Ambulatory Visit
Admission: RE | Admit: 2011-05-08 | Discharge: 2011-05-08 | Disposition: A | Discharge status: Home / Self Care | Payer: PRIVATE HEALTH INSURANCE | Source: Ambulatory Visit | Attending: Oncology | Admitting: Oncology

## 2011-05-08 DIAGNOSIS — Z9889 Other specified postprocedural states: Secondary | ICD-10-CM

## 2011-05-08 DIAGNOSIS — Z853 Personal history of malignant neoplasm of breast: Secondary | ICD-10-CM

## 2011-06-30 ENCOUNTER — Other Ambulatory Visit: Payer: Self-pay

## 2011-06-30 ENCOUNTER — Ambulatory Visit (INDEPENDENT_AMBULATORY_CARE_PROVIDER_SITE_OTHER): Payer: PRIVATE HEALTH INSURANCE | Admitting: Family Medicine

## 2011-06-30 ENCOUNTER — Ambulatory Visit (HOSPITAL_COMMUNITY)
Admission: RE | Admit: 2011-06-30 | Discharge: 2011-06-30 | Disposition: A | Discharge status: Home / Self Care | Payer: PRIVATE HEALTH INSURANCE | Source: Ambulatory Visit | Attending: Family Medicine | Admitting: Family Medicine

## 2011-06-30 VITALS — BP 126/75 | HR 71 | Temp 98.6°F | Wt 148.0 lb

## 2011-06-30 DIAGNOSIS — E119 Type 2 diabetes mellitus without complications: Secondary | ICD-10-CM

## 2011-06-30 DIAGNOSIS — Z23 Encounter for immunization: Secondary | ICD-10-CM

## 2011-06-30 DIAGNOSIS — R079 Chest pain, unspecified: Secondary | ICD-10-CM | POA: Insufficient documentation

## 2011-06-30 DIAGNOSIS — R5381 Other malaise: Secondary | ICD-10-CM | POA: Insufficient documentation

## 2011-06-30 DIAGNOSIS — R0609 Other forms of dyspnea: Secondary | ICD-10-CM

## 2011-06-30 DIAGNOSIS — R5383 Other fatigue: Secondary | ICD-10-CM

## 2011-06-30 DIAGNOSIS — R9431 Abnormal electrocardiogram [ECG] [EKG]: Secondary | ICD-10-CM | POA: Insufficient documentation

## 2011-06-30 DIAGNOSIS — R0600 Dyspnea, unspecified: Secondary | ICD-10-CM

## 2011-06-30 LAB — POCT GLYCOSYLATED HEMOGLOBIN (HGB A1C): Hemoglobin A1C: 9.2

## 2011-06-30 LAB — CBC
HCT: 37 % (ref 36.0–46.0)
MCH: 28.4 pg (ref 26.0–34.0)
MCHC: 31.9 g/dL (ref 30.0–36.0)
RDW: 14.5 % (ref 11.5–15.5)

## 2011-06-30 MED ORDER — ZOSTER VACCINE LIVE 19400 UNT/0.65ML ~~LOC~~ SOLR: 0.6500 mL | Freq: Once | SUBCUTANEOUS | Status: DC

## 2011-06-30 NOTE — Assessment & Plan Note (Addendum)
History suggestive of non-cardiac chest pain.  Associated with decreasing exercise tolerance in a patient who is at high risk given history of coronary stent.  EKG unchanged today.  Plan to rule out mild heart failure, will further explore pulmonary vs deconditiong at follow-up.  She will follow-up with her cardiologist.  Will get CMET, CBC, TSH, BNP.    Update:  Labs wnl, suspect more multifactorial dyspnea with non-cardiac chest pain.

## 2011-06-30 NOTE — Patient Instructions (Signed)
Will check your labs today including your thyroid and kidney function. I will send you a letter if everything looks ok. I think you overall feeling bad is due to elevated blood sugar and less sleep and physical activity. Increase Metformin to 1000 mg twice a day  Make follow-up appointment with Dr. Cristal Ford to discuss making improvement in your overall health (diabetes)

## 2011-06-30 NOTE — Progress Notes (Signed)
  Subjective:    Patient ID: Dana Coleman, female    DOB: [birth year], 70 y.o.   MRN: [id number]  HPI Here for work in appointment for "feeling bad" x 4 weeks.  Does not want to "get up and go"  "Low energy"   Notes has been waking up at night- going to the bathroom but now harder to get back to sleep.  Stays up thinking about deceased relatives- mostly happy memories.  After more thought, states she has been giving more through to improving her health since her daughter just had cardiac  stents placed.    Chest pain:  Cardiologist Dr. Donnie Aho, Saw him in June, no changes.  Sharp pain  Points to right chest "where my stent is".  Sharp pain lasts for seconds then goes away at night.  Occurs at night, does not wake her up from sleep but occurs when she is already awake thinking while laying in bed, no dyspnea with these episodes.  No chest pain on exertion.  + dyspnea- can only walk to mailbox which is less- she estimated able to walk around the block a few times previously.  NO presyncope.  Dyspnea:  Decreased exercise tolerance. No cough, fever  DM: notes increased fasting  CBG 200-400's, usually lower than this.   Review of Systems + dizziness on standing   - nausea, vomiting, cough, edema, numbness, tingling Objective:   Physical Exam GEN: Alert & Oriented, No acute distress CV:  Regular Rate & Rhythm, no murmur Respiratory:  Normal work of breathing, CTAB Abd:  + BS, soft, no tenderness to palpation Ext: no pre-tibial edema        Assessment & Plan:

## 2011-07-01 DIAGNOSIS — R0600 Dyspnea, unspecified: Secondary | ICD-10-CM | POA: Insufficient documentation

## 2011-07-01 LAB — COMPREHENSIVE METABOLIC PANEL
ALT: 12 U/L (ref 0–35)
AST: 17 U/L (ref 0–37)
Calcium: 9.7 mg/dL (ref 8.4–10.5)
Chloride: 102 mEq/L (ref 96–112)
Creat: 0.95 mg/dL (ref 0.50–1.10)
Potassium: 4.5 mEq/L (ref 3.5–5.3)

## 2011-07-01 LAB — TSH: TSH: 0.781 u[IU]/mL (ref 0.350–4.500)

## 2011-07-01 LAB — BRAIN NATRIURETIC PEPTIDE: Brain Natriuretic Peptide: 46.2 pg/mL (ref 0.0–100.0)

## 2011-07-01 NOTE — Assessment & Plan Note (Addendum)
a1c 9.2%, much worsened.  Had not been taking Metformin twice a day as prescribed.  Glipizide at 10mg  daily.  Discussed with patient d/c glipizide and actos especially given her cardiac concerns, starting insulin. she is reluctant to change.   She states she has just done diabetes nutrition counseling and would like to try again on her own.  Will follow-up with PCP in 3 months.  Consider increase in glipizide at that time

## 2011-07-01 NOTE — Assessment & Plan Note (Signed)
Ruled out CHF as cause of decreasing exercise tolerance with normal BNP and she also had normal TSH, CBC.  Non-smoker.  Will return to see PCP for further workup to r/o pulmonary cause.  I suspect multifactorial/deconditioning.

## 2011-09-18 ENCOUNTER — Other Ambulatory Visit: Payer: Self-pay | Admitting: Oncology

## 2011-09-18 ENCOUNTER — Other Ambulatory Visit (HOSPITAL_BASED_OUTPATIENT_CLINIC_OR_DEPARTMENT_OTHER): Payer: PRIVATE HEALTH INSURANCE

## 2011-09-18 DIAGNOSIS — Z170 Estrogen receptor positive status [ER+]: Secondary | ICD-10-CM

## 2011-09-18 DIAGNOSIS — C50419 Malignant neoplasm of upper-outer quadrant of unspecified female breast: Secondary | ICD-10-CM

## 2011-09-18 LAB — CBC WITH DIFFERENTIAL/PLATELET
Eosinophils Absolute: 0.1 10*3/uL (ref 0.0–0.5)
HGB: 11.9 g/dL (ref 11.6–15.9)
MONO#: 0.6 10*3/uL (ref 0.1–0.9)
MONO%: 8.8 % (ref 0.0–14.0)
NEUT#: 4.6 10*3/uL (ref 1.5–6.5)
RBC: 4.03 10*6/uL (ref 3.70–5.45)
RDW: 14.9 % — ABNORMAL HIGH (ref 11.2–14.5)
WBC: 7.2 10*3/uL (ref 3.9–10.3)
lymph#: 1.8 10*3/uL (ref 0.9–3.3)

## 2011-09-19 LAB — COMPREHENSIVE METABOLIC PANEL
Albumin: 3.9 g/dL (ref 3.5–5.2)
Alkaline Phosphatase: 76 U/L (ref 39–117)
CO2: 25 mEq/L (ref 19–32)
Calcium: 9.7 mg/dL (ref 8.4–10.5)
Chloride: 102 mEq/L (ref 96–112)
Glucose, Bld: 125 mg/dL — ABNORMAL HIGH (ref 70–99)
Potassium: 3.9 mEq/L (ref 3.5–5.3)
Sodium: 138 mEq/L (ref 135–145)
Total Protein: 7.1 g/dL (ref 6.0–8.3)

## 2011-09-19 LAB — CANCER ANTIGEN 27.29: CA 27.29: 21 U/mL (ref 0–39)

## 2011-09-24 ENCOUNTER — Ambulatory Visit: Payer: PRIVATE HEALTH INSURANCE | Admitting: Oncology

## 2011-09-30 ENCOUNTER — Ambulatory Visit (HOSPITAL_BASED_OUTPATIENT_CLINIC_OR_DEPARTMENT_OTHER): Payer: Medicaid Other | Admitting: Oncology

## 2011-09-30 ENCOUNTER — Telehealth: Payer: Self-pay | Admitting: Oncology

## 2011-09-30 DIAGNOSIS — E559 Vitamin D deficiency, unspecified: Secondary | ICD-10-CM

## 2011-09-30 DIAGNOSIS — M949 Disorder of cartilage, unspecified: Secondary | ICD-10-CM

## 2011-09-30 DIAGNOSIS — M899 Disorder of bone, unspecified: Secondary | ICD-10-CM

## 2011-09-30 DIAGNOSIS — C50919 Malignant neoplasm of unspecified site of unspecified female breast: Secondary | ICD-10-CM

## 2011-09-30 NOTE — Telephone Encounter (Signed)
Gv pt appt for [id number] 

## 2011-09-30 NOTE — Progress Notes (Signed)
Hematology and Oncology Follow Up Visit  Dana Coleman [id number] 01-Jun-1941 70 y.o. 09/30/2011 2:09 PM PCP dr Dana Coleman  Principle Diagnosis: Node negative, ER/PR positive right breast cancer status post lumpectomy 06/12/2008, status post radiation therapy completed December 2009, currently on Fosamax, calcium, vitamin D and Femara. Hx hyperlipidemia Hx of hypertension  Interim History:  There have been no intercurrent illness, hospitalizations or medication changes.  Medications: I have reviewed the patient's current medications.  Allergies: No Known Allergies  Past Medical History, Surgical history, Social history, and Family History were reviewed and updated.  Review of Systems: Constitutional:  Negative for fever, chills, night sweats, anorexia, weight loss, pain. Cardiovascular: no chest pain or dyspnea on exertion Respiratory: no cough, shortness of breath, or wheezing Neurological: no TIA or stroke symptoms Dermatological: negative ENT: negative Skin Gastrointestinal: no abdominal pain, change in bowel habits, or black or bloody stools Genito-Urinary: no dysuria, trouble voiding, or hematuria Hematological and Lymphatic: negative Breast: negative for breast lumps Musculoskeletal: negative Remaining ROS negative.  Physical Exam: Blood pressure 124/77, pulse 68, temperature 97 F (36.1 C), height 5' 1.5" (1.562 m), weight 142 lb 12.8 oz (64.774 kg). ECOG: 0 General appearance: alert, cooperative and appears stated age Head: Normocephalic, without obvious abnormality, atraumatic Neck: no adenopathy, no carotid bruit, no JVD, supple, symmetrical, trachea midline and thyroid not enlarged, symmetric, no tenderness/mass/nodules Lymph nodes: Cervical, supraclavicular, and axillary nodes normal. Cardiac : regular rate and rhythm, no murmurs or gallops Pulmonary:clear to auscultation bilaterally and normal percussion bilaterally Breasts: inspection negative, no nipple  discharge or bleeding, no masses or nodularity palpable, rt breast s/p lumpectomy; well healed , some firmness around incision Abdomen:soft, non-tender; bowel sounds normal; no masses,  no organomegaly Extremities negative Neuro: alert, oriented, normal speech, no focal findings or movement disorder noted  Lab Results: Lab Results  Component Value Date   WBC 7.2 09/18/2011   HGB 11.9 09/18/2011   HCT 36.1 09/18/2011   MCV 89.4 09/18/2011   PLT 197 09/18/2011     Chemistry      Component Value Date/Time   NA 138 09/18/2011 1420   NA 138 09/18/2011 1420   K 3.9 09/18/2011 1420   K 3.9 09/18/2011 1420   CL 102 09/18/2011 1420   CL 102 09/18/2011 1420   CO2 25 09/18/2011 1420   CO2 25 09/18/2011 1420   BUN 21 09/18/2011 1420   BUN 21 09/18/2011 1420   CREATININE 1.12* 09/18/2011 1420   CREATININE 1.12* 09/18/2011 1420   CREATININE 0.95 06/30/2011 1550      Component Value Date/Time   CALCIUM 9.7 09/18/2011 1420   CALCIUM 9.7 09/18/2011 1420   ALKPHOS 76 09/18/2011 1420   ALKPHOS 76 09/18/2011 1420   AST 18 09/18/2011 1420   AST 18 09/18/2011 1420   ALT 18 09/18/2011 1420   ALT 18 09/18/2011 1420   BILITOT 0.2* 09/18/2011 1420   BILITOT 0.2* 09/18/2011 1420      .pathology. Radiological Studies: chest X-ray n/a Mammogram 7/12-wnl Bone density Mild osteopenia-stable  Impression and Plan: Ms Dana Coleman is doing well, i will see her in 6 mo, for f/u.she will continue femara; she is not taking fosamax  More than 50% of the visit was spent in patient-related counselling   Pierce Crane, MD 12/18/20122:09 PM

## 2011-11-03 ENCOUNTER — Other Ambulatory Visit: Payer: Self-pay | Admitting: *Deleted

## 2011-11-03 ENCOUNTER — Other Ambulatory Visit: Payer: Self-pay | Admitting: Oncology

## 2011-11-03 DIAGNOSIS — C50419 Malignant neoplasm of upper-outer quadrant of unspecified female breast: Secondary | ICD-10-CM

## 2011-11-04 ENCOUNTER — Other Ambulatory Visit: Payer: Self-pay | Admitting: *Deleted

## 2011-11-05 ENCOUNTER — Telehealth: Payer: Self-pay | Admitting: *Deleted

## 2011-11-05 NOTE — Telephone Encounter (Signed)
na

## 2011-11-05 NOTE — Telephone Encounter (Signed)
Na

## 2011-12-09 ENCOUNTER — Ambulatory Visit: Payer: PRIVATE HEALTH INSURANCE | Admitting: Oncology

## 2011-12-09 ENCOUNTER — Other Ambulatory Visit: Payer: PRIVATE HEALTH INSURANCE | Admitting: Lab

## 2011-12-19 ENCOUNTER — Other Ambulatory Visit: Payer: Self-pay | Admitting: *Deleted

## 2011-12-19 NOTE — Telephone Encounter (Signed)
Pt. Called wanting refill on femara and wants authorization for one year.  Instructed pt. To call pharmacy and let them know to ask for one year refill and authorization from insurance co and they will send this to [place].  She will call her pharmacy and request this.

## 2011-12-22 ENCOUNTER — Other Ambulatory Visit: Payer: Self-pay | Admitting: Oncology

## 2011-12-22 DIAGNOSIS — C50919 Malignant neoplasm of unspecified site of unspecified female breast: Secondary | ICD-10-CM

## 2012-01-02 ENCOUNTER — Encounter: Payer: Self-pay | Admitting: Family Medicine

## 2012-01-20 ENCOUNTER — Ambulatory Visit (INDEPENDENT_AMBULATORY_CARE_PROVIDER_SITE_OTHER): Payer: PRIVATE HEALTH INSURANCE | Admitting: Family Medicine

## 2012-01-20 ENCOUNTER — Encounter: Payer: Self-pay | Admitting: Family Medicine

## 2012-01-20 VITALS — BP 111/68 | HR 66 | Temp 98.4°F | Ht 61.5 in | Wt 144.0 lb

## 2012-01-20 DIAGNOSIS — R5383 Other fatigue: Secondary | ICD-10-CM

## 2012-01-20 DIAGNOSIS — E119 Type 2 diabetes mellitus without complications: Secondary | ICD-10-CM

## 2012-01-20 DIAGNOSIS — R5381 Other malaise: Secondary | ICD-10-CM

## 2012-01-20 DIAGNOSIS — I10 Essential (primary) hypertension: Secondary | ICD-10-CM

## 2012-01-20 DIAGNOSIS — E7800 Pure hypercholesterolemia, unspecified: Secondary | ICD-10-CM

## 2012-01-20 LAB — POCT UA - MICROALBUMIN
Albumin/Creatinine Ratio, Urine, POC: ABNORMAL
Creatinine, POC: 50 mg/dL
Microalbumin Ur, POC: 10 mg/dL

## 2012-01-20 NOTE — Patient Instructions (Signed)
Nice to meet you. I will call about your A1c, goal is less than 8. If still above goal, will need to make adjustments or send you to nutritionist. Your blood pressure is good today. I do not know of a medical cause for your fatigue, but if you enjoy walking would make that a daily part of routine. Make an appointment in 3-4 months for check up on diabetes.  Diabetes and Exercise Regular exercise is important and can help:   Control blood glucose (sugar).   Decrease blood pressure.    Control blood lipids (cholesterol, triglycerides).   Improve overall health.  BENEFITS FROM EXERCISE  Improved fitness.   Improved flexibility.   Improved endurance.   Increased bone density.   Weight control.   Increased muscle strength.   Decreased body fat.   Improvement of the body's use of insulin, a hormone.   Increased insulin sensitivity.   Reduction of insulin needs.   Reduced stress and tension.   Helps you feel better.  People with diabetes who add exercise to their lifestyle gain additional benefits, including:  Weight loss.   Reduced appetite.   Improvement of the body's use of blood glucose.   Decreased risk factors for heart disease:   Lowering of cholesterol and triglycerides.   Raising the level of good cholesterol (high-density lipoproteins, HDL).   Lowering blood sugar.   Decreased blood pressure.  TYPE 1 DIABETES AND EXERCISE  Exercise will usually lower your blood glucose.   If blood glucose is greater than 240 mg/dl, check urine ketones. If ketones are present, do not exercise.   Location of the insulin injection sites may need to be adjusted with exercise. Avoid injecting insulin into areas of the body that will be exercised. For example, avoid injecting insulin into:   The arms when playing tennis.   The legs when jogging. For more information, discuss this with your caregiver.   Keep a record of:   Food intake.   Type and amount of  exercise.   Expected peak times of insulin action.   Blood glucose levels.  Do this before, during, and after exercise. Review your records with your caregiver. This will help you to develop guidelines for adjusting food intake and insulin amounts.  TYPE 2 DIABETES AND EXERCISE  Regular physical activity can help control blood glucose.   Exercise is important because it may:   Increase the body's sensitivity to insulin.   Improve blood glucose control.   Exercise reduces the risk of heart disease. It decreases serum cholesterol and triglycerides. It also lowers blood pressure.   Those who take insulin or oral hypoglycemic agents should watch for signs of hypoglycemia. These signs include dizziness, shaking, sweating, chills, and confusion.   Body water is lost during exercise. It must be replaced. This will help to avoid loss of body fluids (dehydration) or heat stroke.  Be sure to talk to your caregiver before starting an exercise program to make sure it is safe for you. Remember, any activity is better than none.  Document Released: 12/20/2003 Document Revised: 09/18/2011 Document Reviewed: 04/05/2009 [facility] Patient Information 2012 [place], [place].

## 2012-01-21 NOTE — Assessment & Plan Note (Signed)
A1c at goal now that glipizide 20mg  daily. Had isolated low blood sugar. Will f/u in 3 months may need to reduce therapy if his becomes consistent problem. Continue ARB with microalbuminuria.

## 2012-01-21 NOTE — Assessment & Plan Note (Signed)
At goal, no changes today. Check labs at next visit in 3-4 months.  BP Readings from Last 3 Encounters:  01/20/12 111/68  09/30/11 124/77  06/30/11 126/75

## 2012-01-21 NOTE — Progress Notes (Signed)
  Subjective:    Patient ID: Dana Coleman, female    DOB: [birth year], 71 y.o.   MRN: [id number]  HPI  1. DM. Patient states her CBGs under decent control. Lowest was 67, felt weak and checked. Also recently was 157 when she felt weak. Denies values >250. Denies polyuria, polydipsia. No foot wounds or visual changes. Last A1c was >9 and this surprised patient, normal in 7 range. Taking actos, metformin and glipizide 10mg  BID.   Lab Results  Component Value Date   HGBA1C 7.5 01/20/2012   2. Low energy. States this has been present for past month, but noted this same problem description multiple times in past 2 years. Denies dyspnea, chest pain, depression, sleep problems, weight changes, edema.   3. HTN. Taking benicar, norvasc, HCTZ daily. Denies side effects. Does not check BP at home.   4. HLD. Taking crestor daily. Not fasting for labs today.   Review of Systems See HPI otherwise negative. nonsmoker    Objective:   Physical Exam  Vitals reviewed. Constitutional: She is oriented to person, place, and time. She appears well-developed and well-nourished. No distress.  HENT:  Head: Normocephalic and atraumatic.  Mouth/Throat: Oropharynx is clear and moist. No oropharyngeal exudate.  Eyes: EOM are normal. Pupils are equal, round, and reactive to light.  Cardiovascular: Normal rate, regular rhythm, normal heart sounds and intact distal pulses.   No murmur heard. Pulmonary/Chest: Effort normal and breath sounds normal. No respiratory distress. She has no wheezes. She has no rales.  Abdominal: Soft.  Musculoskeletal: She exhibits no edema and no tenderness.       Feet without lesions, warm and well perfused. Toenails normal.  Lymphadenopathy:    She has no cervical adenopathy.  Neurological: She is alert and oriented to person, place, and time. No cranial nerve deficit. Coordination normal.  Skin: No rash noted.  Psychiatric: She has a normal mood and affect.       Assessment &  Plan:

## 2012-01-21 NOTE — Assessment & Plan Note (Signed)
At goal last year, due for FLP. Will check fasting labs next visit. Continue crestor.

## 2012-01-21 NOTE — Assessment & Plan Note (Signed)
No red flags-dyspnea, chest pain, depression. Recommend patient start regular exercise routine to improve endurance. Will not repeat lab tests, TSH as these have all been normal and no physical manifestations. F/u if worsens.

## 2012-03-02 ENCOUNTER — Other Ambulatory Visit: Payer: Self-pay | Admitting: Family Medicine

## 2012-03-24 ENCOUNTER — Telehealth: Payer: Self-pay | Admitting: *Deleted

## 2012-03-24 NOTE — Telephone Encounter (Signed)
Patient called not feeling well.  Rescheduled lab appt for 6/17 w/ pt.

## 2012-03-25 ENCOUNTER — Other Ambulatory Visit: Payer: PRIVATE HEALTH INSURANCE | Admitting: Lab

## 2012-03-29 ENCOUNTER — Other Ambulatory Visit (HOSPITAL_BASED_OUTPATIENT_CLINIC_OR_DEPARTMENT_OTHER): Payer: PRIVATE HEALTH INSURANCE

## 2012-03-29 ENCOUNTER — Other Ambulatory Visit: Payer: Self-pay | Admitting: Oncology

## 2012-03-29 DIAGNOSIS — E559 Vitamin D deficiency, unspecified: Secondary | ICD-10-CM

## 2012-03-29 DIAGNOSIS — Z853 Personal history of malignant neoplasm of breast: Secondary | ICD-10-CM

## 2012-03-29 DIAGNOSIS — C50919 Malignant neoplasm of unspecified site of unspecified female breast: Secondary | ICD-10-CM

## 2012-03-29 LAB — CBC WITH DIFFERENTIAL/PLATELET
EOS%: 1.8 % (ref 0.0–7.0)
Eosinophils Absolute: 0.1 10*3/uL (ref 0.0–0.5)
LYMPH%: 31.6 % (ref 14.0–49.7)
MCH: 29.3 pg (ref 25.1–34.0)
MCHC: 32.4 g/dL (ref 31.5–36.0)
MCV: 90.4 fL (ref 79.5–101.0)
MONO%: 10.5 % (ref 0.0–14.0)
NEUT#: 2.6 10*3/uL (ref 1.5–6.5)
Platelets: 154 10*3/uL (ref 145–400)
RBC: 3.87 10*6/uL (ref 3.70–5.45)

## 2012-03-30 ENCOUNTER — Other Ambulatory Visit: Payer: Self-pay | Admitting: Family Medicine

## 2012-03-30 DIAGNOSIS — I10 Essential (primary) hypertension: Secondary | ICD-10-CM

## 2012-03-30 DIAGNOSIS — E119 Type 2 diabetes mellitus without complications: Secondary | ICD-10-CM

## 2012-03-30 LAB — COMPREHENSIVE METABOLIC PANEL
Alkaline Phosphatase: 60 U/L (ref 39–117)
Glucose, Bld: 71 mg/dL (ref 70–99)
Sodium: 141 mEq/L (ref 135–145)
Total Bilirubin: 0.2 mg/dL — ABNORMAL LOW (ref 0.3–1.2)
Total Protein: 6.8 g/dL (ref 6.0–8.3)

## 2012-03-31 ENCOUNTER — Telehealth: Payer: Self-pay | Admitting: *Deleted

## 2012-03-31 NOTE — Telephone Encounter (Signed)
Spoke with patient, per dr Donnie Coffin, she is to increase her vitamin D3 to 2000mg /day. Patient verbalized understanding.

## 2012-04-01 ENCOUNTER — Telehealth: Payer: Self-pay | Admitting: Oncology

## 2012-04-01 ENCOUNTER — Ambulatory Visit (HOSPITAL_BASED_OUTPATIENT_CLINIC_OR_DEPARTMENT_OTHER): Payer: Medicaid Other | Admitting: Oncology

## 2012-04-01 VITALS — BP 107/64 | HR 74 | Temp 98.6°F | Ht 61.5 in | Wt 147.2 lb

## 2012-04-01 DIAGNOSIS — C50419 Malignant neoplasm of upper-outer quadrant of unspecified female breast: Secondary | ICD-10-CM

## 2012-04-01 DIAGNOSIS — E559 Vitamin D deficiency, unspecified: Secondary | ICD-10-CM

## 2012-04-01 DIAGNOSIS — C50919 Malignant neoplasm of unspecified site of unspecified female breast: Secondary | ICD-10-CM

## 2012-04-01 DIAGNOSIS — Z170 Estrogen receptor positive status [ER+]: Secondary | ICD-10-CM

## 2012-04-01 DIAGNOSIS — M899 Disorder of bone, unspecified: Secondary | ICD-10-CM

## 2012-04-01 NOTE — Telephone Encounter (Signed)
gve the pt her July bone densitry appt [facility] along with the dec 2013 appts

## 2012-04-01 NOTE — Progress Notes (Signed)
Hematology and Oncology Follow Up Visit  Dana Coleman [id number] 01-24-1941 71 y.o. 04/01/2012 2:22 PM PCP dr Earnest Bailey  Principle Diagnosis: Node negative, ER/PR positive right breast cancer status post lumpectomy 06/12/2008, status post radiation therapy completed December 2009, currently on Fosamax, calcium, vitamin D and Femara. Hx hyperlipidemia Hx of hypertension  Interim History:  There have been no intercurrent illness, hospitalizations or medication changes.  Medications: I have reviewed the patient's current medications.  Allergies: No Known Allergies  Past Medical History, Surgical history, Social history, and Family History were reviewed and updated.  Review of Systems: Constitutional:  Negative for fever, chills, night sweats, anorexia, weight loss, pain. Cardiovascular: no chest pain or dyspnea on exertion Respiratory: no cough, shortness of breath, or wheezing Neurological: no TIA or stroke symptoms Dermatological: negative ENT: negative Skin Gastrointestinal: no abdominal pain, change in bowel habits, or black or bloody stools Genito-Urinary: no dysuria, trouble voiding, or hematuria Hematological and Lymphatic: negative Breast: negative for breast lumps Musculoskeletal: negative Remaining ROS negative.  Physical Exam: Blood pressure 107/64, pulse 74, temperature 98.6 F (37 C), temperature source Oral, height 5' 1.5" (1.562 m), weight 147 lb 3.2 oz (66.769 kg). ECOG: 0 General appearance: alert, cooperative and appears stated age Head: Normocephalic, without obvious abnormality, atraumatic Neck: no adenopathy, no carotid bruit, no JVD, supple, symmetrical, trachea midline and thyroid not enlarged, symmetric, no tenderness/mass/nodules Lymph nodes: Cervical, supraclavicular, and axillary nodes normal. Cardiac : regular rate and rhythm, no murmurs or gallops Pulmonary:clear to auscultation bilaterally and normal percussion bilaterally Breasts: inspection  negative, no nipple discharge or bleeding, no masses or nodularity palpable, rt breast s/p lumpectomy; well healed , some firmness around incision Abdomen:soft, non-tender; bowel sounds normal; no masses,  no organomegaly Extremities negative Neuro: alert, oriented, normal speech, no focal findings or movement disorder noted  Lab Results: Lab Results  Component Value Date   WBC 4.7 03/29/2012   HGB 11.3* 03/29/2012   HCT 35.0 03/29/2012   MCV 90.4 03/29/2012   PLT 154 03/29/2012     Chemistry      Component Value Date/Time   NA 141 03/29/2012 1351   K 4.0 03/29/2012 1351   CL 104 03/29/2012 1351   CO2 30 03/29/2012 1351   BUN 13 03/29/2012 1351   CREATININE 0.93 03/29/2012 1351   CREATININE 0.95 06/30/2011 1550      Component Value Date/Time   CALCIUM 9.3 03/29/2012 1351   ALKPHOS 60 03/29/2012 1351   AST 20 03/29/2012 1351   ALT 13 03/29/2012 1351   BILITOT 0.2* 03/29/2012 1351      .pathology. Radiological Studies: chest X-ray n/a Mammogram 7/12-wnl Bone density Mild osteopenia-stable  Impression and Plan: Dana Coleman is doing well, i will see her in 6 mo, for f/u.she will continue femara; she is due for a mammogram and bone density test this July. I have encouraged her to take additional vitamin D. We will see her in 6 months. More than 50% of the visit was spent in patient-related counselling   Pierce Crane, MD 6/20/20132:22 PM

## 2012-05-10 ENCOUNTER — Ambulatory Visit
Admission: RE | Admit: 2012-05-10 | Discharge: 2012-05-10 | Disposition: A | Discharge status: Home / Self Care | Payer: PRIVATE HEALTH INSURANCE | Source: Ambulatory Visit | Attending: Oncology | Admitting: Oncology

## 2012-05-10 DIAGNOSIS — E559 Vitamin D deficiency, unspecified: Secondary | ICD-10-CM

## 2012-05-10 DIAGNOSIS — Z853 Personal history of malignant neoplasm of breast: Secondary | ICD-10-CM

## 2012-05-10 DIAGNOSIS — C50919 Malignant neoplasm of unspecified site of unspecified female breast: Secondary | ICD-10-CM

## 2012-06-23 ENCOUNTER — Other Ambulatory Visit: Payer: Self-pay | Admitting: Family Medicine

## 2012-07-30 ENCOUNTER — Ambulatory Visit (INDEPENDENT_AMBULATORY_CARE_PROVIDER_SITE_OTHER): Payer: PRIVATE HEALTH INSURANCE | Admitting: Family Medicine

## 2012-07-30 ENCOUNTER — Encounter: Payer: Self-pay | Admitting: Family Medicine

## 2012-07-30 VITALS — BP 133/69 | HR 67 | Temp 97.6°F | Wt 154.0 lb

## 2012-07-30 DIAGNOSIS — E119 Type 2 diabetes mellitus without complications: Secondary | ICD-10-CM

## 2012-07-30 DIAGNOSIS — B351 Tinea unguium: Secondary | ICD-10-CM

## 2012-07-30 DIAGNOSIS — I10 Essential (primary) hypertension: Secondary | ICD-10-CM

## 2012-07-30 MED ORDER — TERBINAFINE HCL 250 MG PO TABS: 250.0000 mg | ORAL_TABLET | Freq: Every day | ORAL | Status: DC

## 2012-07-30 NOTE — Assessment & Plan Note (Signed)
At goal for diabetic and age considerations. Continue treatment. Check labs today. F/u in 3-4 months.

## 2012-07-30 NOTE — Assessment & Plan Note (Signed)
Onset in the past several weeks. Discussed options with patient including continued surveillance. She desires definitive treatment of this for mainly cosmetic reasons. We agreed to start oral terbinafine daily. Will check LFTs today in followup in 6 weeks. Will consider referral to podiatry at next visit as she would benefit from yearly diabetes visits.

## 2012-07-30 NOTE — Assessment & Plan Note (Signed)
Improvement in A1c to below her goal. The patient is concerned about potential harm of toast, will discontinue this medicine. Will recheck her glucose level and A1c in 2-3 months. She is up-to-date on her eye and the screening. Recommend she continue her positive dietary changes as she has achieved very good control with these measures.

## 2012-07-30 NOTE — Progress Notes (Signed)
  Subjective:    Patient ID: Dana Coleman, female    DOB: [birth year], 71 y.o.   MRN: [id number]  HPI  1. DM2. A1c has improved from 7.5-6.2. Patient states she has changed her diet and stopped eating high sugar/carbohydrate foods. She has not been checking blood sugars daily. She is compliant with her metformin, glipizide, Actos. She raises concern about TV commercials indicating Actos being associated with bladder cancer and desires to stop this medication. Polyuria, polydipsia, fatigue, weight loss.  2. hypertension. Compliant with Benicar, HCTZ. Patient denies any syncope, weakness, fatigue, urinary changes. She is due for lab testing.  3. Toenail discoloration. Patient has bilateral thickening, discoloration in her distal great toenails. She is concerned about cosmetic affect, and desires treatment for this. Seen a podiatrist or tried any remedies. This does not cause pain, redness, swelling.  4. Hx of breast cancer. This was treated in 2009 and had no recurrences with her regular followup. Is up-to-date on mammograms and other cancer screenings , has completed Pap screening for her lifetime.  Review of Systems See HPI otherwise negative.  reports that she has never smoked. She does not have any smokeless tobacco history on file.     Objective:   Physical Exam  Vitals reviewed. Constitutional: She is oriented to person, place, and time. She appears well-developed and well-nourished. No distress.       Appears much younger than her stated age  HENT:  Head: Normocephalic and atraumatic.  Mouth/Throat: Oropharynx is clear and moist. No oropharyngeal exudate.  Eyes: EOM are normal. Pupils are equal, round, and reactive to light.  Neck: Normal range of motion. Neck supple. No tracheal deviation present. No thyromegaly present.  Cardiovascular: Normal rate, regular rhythm and normal heart sounds.   No murmur heard. Pulmonary/Chest: Effort normal and breath sounds normal. No respiratory  distress. She has no wheezes. She has no rales.  Abdominal: Soft.  Musculoskeletal: She exhibits no edema and no tenderness.  Neurological: She is alert and oriented to person, place, and time. No cranial nerve deficit. She exhibits normal muscle tone. Coordination normal.       Sensation to light touch and monofilament intact in feet. No ulcers or skin thickening.  Skin: No rash noted. She is not diaphoretic.       Bilateral distal great toenails have white discoloration covering half of the nail plate. Mild thickening is present. No erythema or skin changes present.   Psychiatric: She has a normal mood and affect. Her behavior is normal.       Assessment & Plan:

## 2012-07-30 NOTE — Patient Instructions (Addendum)
Your A1c is excellent today. Good job with diet and exercise. You may try stopping the pioglitazone since there is concern about potential cancer risk. You may start terbinifine for toenail fungus if desired. Please make appointment in 6 weeks for check up.

## 2012-09-07 ENCOUNTER — Encounter: Payer: Self-pay | Admitting: Family Medicine

## 2012-09-07 ENCOUNTER — Ambulatory Visit (INDEPENDENT_AMBULATORY_CARE_PROVIDER_SITE_OTHER): Payer: PRIVATE HEALTH INSURANCE | Admitting: Family Medicine

## 2012-09-07 VITALS — BP 140/82 | HR 69 | Temp 98.8°F | Wt 152.0 lb

## 2012-09-07 DIAGNOSIS — B351 Tinea unguium: Secondary | ICD-10-CM

## 2012-09-07 DIAGNOSIS — C50919 Malignant neoplasm of unspecified site of unspecified female breast: Secondary | ICD-10-CM

## 2012-09-07 DIAGNOSIS — I10 Essential (primary) hypertension: Secondary | ICD-10-CM

## 2012-09-07 DIAGNOSIS — E559 Vitamin D deficiency, unspecified: Secondary | ICD-10-CM

## 2012-09-07 DIAGNOSIS — E119 Type 2 diabetes mellitus without complications: Secondary | ICD-10-CM

## 2012-09-07 LAB — COMPREHENSIVE METABOLIC PANEL
Albumin: 4.5 g/dL (ref 3.5–5.2)
Alkaline Phosphatase: 69 U/L (ref 39–117)
BUN: 13 mg/dL (ref 6–23)
CO2: 29 mEq/L (ref 19–32)
Glucose, Bld: 195 mg/dL — ABNORMAL HIGH (ref 70–99)
Potassium: 4.3 mEq/L (ref 3.5–5.3)
Total Protein: 7.6 g/dL (ref 6.0–8.3)

## 2012-09-07 NOTE — Patient Instructions (Addendum)
Nice to see you. Glad your toenails are looking better. Keep taking lamisil for 6 more weeks. Look for Vitamin B12 from the drug store for toe pain. I will call if lab tests are abnormal.  Make appointment for check up in 2-3 months.

## 2012-09-07 NOTE — Assessment & Plan Note (Signed)
Borderline goal for age. Followup in 2-3 months. Continue Benicar, Norvasc.

## 2012-09-07 NOTE — Assessment & Plan Note (Signed)
Improved. Will check LFTs and continue 6 more weeks of therapy. Followup in 2-3 months.

## 2012-09-07 NOTE — Assessment & Plan Note (Signed)
At goal. Will follow up A1c off of Actos in 2 months. Discussed trial of vitamin B 12 for her possible very early peripheral neuropathy.

## 2012-09-07 NOTE — Progress Notes (Signed)
  Subjective:    Patient ID: Dana Coleman, female    DOB: [birth year], 71 y.o.   MRN: [id number]  HPI  1. F/u toenail thickening. Started taking daily Lamisil. Feels is improved. One of the great toenails cracked distally and fell off. She denies any pain, redness, swelling. Denies any medication side effects.  2. DM2. A1c well controlled last visit. She has stopped the Actos. We plan to recheck A1c in 2 months. Patient denies any symptoms of hyperglycemia polyuria, polydipsia, fatigue. She does note a very rare sharp stabbing sensation in her distal right great toe. She has good sensation in her feet otherwise.  Review of Systems See HPI otherwise negative.  reports that she has never smoked. She does not have any smokeless tobacco history on file.     Objective:   Physical Exam  Vitals reviewed. Constitutional: She is oriented to person, place, and time. She appears well-developed and well-nourished. No distress.  HENT:  Head: Normocephalic and atraumatic.  Eyes: EOM are normal. Pupils are equal, round, and reactive to light.  Pulmonary/Chest: Effort normal.  Neurological: She is alert and oriented to person, place, and time. Coordination normal.  Skin: No rash noted. She is not diaphoretic.       Bilateral distal toenails appear improved discoloration and thickening. There is mild distal onycholysis and hypopigmentation. No erythema. edema or TTP.  Good sensation to light touch. No callous or ulcer.          Assessment & Plan:

## 2012-09-08 ENCOUNTER — Encounter: Payer: Self-pay | Admitting: Family Medicine

## 2012-09-08 LAB — VITAMIN D 25 HYDROXY (VIT D DEFICIENCY, FRACTURES): Vit D, 25-Hydroxy: 44 ng/mL (ref 30–89)

## 2012-09-21 ENCOUNTER — Other Ambulatory Visit (HOSPITAL_BASED_OUTPATIENT_CLINIC_OR_DEPARTMENT_OTHER): Payer: PRIVATE HEALTH INSURANCE | Admitting: Lab

## 2012-09-21 DIAGNOSIS — C50419 Malignant neoplasm of upper-outer quadrant of unspecified female breast: Secondary | ICD-10-CM

## 2012-09-21 DIAGNOSIS — C50919 Malignant neoplasm of unspecified site of unspecified female breast: Secondary | ICD-10-CM

## 2012-09-21 DIAGNOSIS — E559 Vitamin D deficiency, unspecified: Secondary | ICD-10-CM

## 2012-09-21 LAB — CBC WITH DIFFERENTIAL/PLATELET
BASO%: 0.4 % (ref 0.0–2.0)
Eosinophils Absolute: 0.1 10*3/uL (ref 0.0–0.5)
LYMPH%: 22.9 % (ref 14.0–49.7)
MCHC: 32.1 g/dL (ref 31.5–36.0)
MONO#: 0.8 10*3/uL (ref 0.1–0.9)
NEUT#: 5.6 10*3/uL (ref 1.5–6.5)
Platelets: 175 10*3/uL (ref 145–400)
RBC: 3.92 10*6/uL (ref 3.70–5.45)
RDW: 14.5 % (ref 11.2–14.5)
WBC: 8.3 10*3/uL (ref 3.9–10.3)
lymph#: 1.9 10*3/uL (ref 0.9–3.3)

## 2012-09-23 ENCOUNTER — Telehealth: Payer: Self-pay | Admitting: *Deleted

## 2012-09-23 NOTE — Telephone Encounter (Signed)
Patient stated she is off on Tuesday would preferr to leave the appointment on 09-28-2012

## 2012-09-28 ENCOUNTER — Telehealth: Payer: Self-pay | Admitting: *Deleted

## 2012-09-28 ENCOUNTER — Ambulatory Visit (HOSPITAL_BASED_OUTPATIENT_CLINIC_OR_DEPARTMENT_OTHER): Payer: PRIVATE HEALTH INSURANCE | Admitting: Oncology

## 2012-09-28 VITALS — BP 129/73 | HR 76 | Temp 98.4°F | Resp 20 | Ht 61.5 in | Wt 152.6 lb

## 2012-09-28 DIAGNOSIS — C50919 Malignant neoplasm of unspecified site of unspecified female breast: Secondary | ICD-10-CM

## 2012-09-28 DIAGNOSIS — C50419 Malignant neoplasm of upper-outer quadrant of unspecified female breast: Secondary | ICD-10-CM

## 2012-09-28 DIAGNOSIS — M899 Disorder of bone, unspecified: Secondary | ICD-10-CM

## 2012-09-28 DIAGNOSIS — Z170 Estrogen receptor positive status [ER+]: Secondary | ICD-10-CM

## 2012-09-28 MED ORDER — ALENDRONATE SODIUM 70 MG PO TABS: 70.0000 mg | ORAL_TABLET | ORAL | Status: DC

## 2012-09-28 NOTE — Progress Notes (Signed)
Hematology and Oncology Follow Up Visit  Dana Coleman [id number] 02-05-1941 71 y.o. 09/28/2012 3:25 PM PCP dr Earnest Bailey  Principle Diagnosis: Node negative, ER/PR positive right breast cancer status post lumpectomy 06/12/2008, status post radiation therapy completed December 2009, currently on , calcium, vitamin D and Femara. Hx hyperlipidemia Hx of hypertension  Interim History:  There have been no intercurrent illness, hospitalizations or medication changes. She does not been taking Fosamax. She is tolerating the Femara. She has no complaints relation to that. She is approaching the five-year mark with respect to taking an AI.  Medications: I have reviewed the patient's current medications.  Allergies: No Known Allergies  Past Medical History, Surgical history, Social history, and Family History were reviewed and updated.  Review of Systems: Constitutional:  Negative for fever, chills, night sweats, anorexia, weight loss, pain. Cardiovascular: no chest pain or dyspnea on exertion Respiratory: no cough, shortness of breath, or wheezing Neurological: no TIA or stroke symptoms Dermatological: negative ENT: negative Skin Gastrointestinal: no abdominal pain, change in bowel habits, or black or bloody stools Genito-Urinary: no dysuria, trouble voiding, or hematuria Hematological and Lymphatic: negative Breast: negative for breast lumps Musculoskeletal: negative Remaining ROS negative.  Physical Exam: Blood pressure 129/73, pulse 76, temperature 98.4 F (36.9 C), temperature source Oral, resp. rate 20, height 5' 1.5" (1.562 m), weight 152 lb 9.6 oz (69.219 kg). ECOG: 0 General appearance: alert, cooperative and appears stated age Head: Normocephalic, without obvious abnormality, atraumatic Neck: no adenopathy, no carotid bruit, no JVD, supple, symmetrical, trachea midline and thyroid not enlarged, symmetric, no tenderness/mass/nodules Lymph nodes: Cervical, supraclavicular, and  axillary nodes normal. Cardiac : regular rate and rhythm, no murmurs or gallops Pulmonary:clear to auscultation bilaterally and normal percussion bilaterally Breasts: inspection negative, no nipple discharge or bleeding, no masses or nodularity palpable, rt breast s/p lumpectomy; well healed , some firmness around incision Abdomen:soft, non-tender; bowel sounds normal; no masses,  no organomegaly Extremities negative Neuro: alert, oriented, normal speech, no focal findings or movement disorder noted  Lab Results: Lab Results  Component Value Date   WBC 8.3 09/21/2012   HGB 11.7 09/21/2012   HCT 36.5 09/21/2012   MCV 93.1 09/21/2012   PLT 175 09/21/2012     Chemistry      Component Value Date/Time   NA 141 09/07/2012 1149   K 4.3 09/07/2012 1149   CL 102 09/07/2012 1149   CO2 29 09/07/2012 1149   BUN 13 09/07/2012 1149   CREATININE 0.89 09/07/2012 1149   CREATININE 0.93 03/29/2012 1351      Component Value Date/Time   CALCIUM 9.7 09/07/2012 1149   ALKPHOS 69 09/07/2012 1149   AST 19 09/07/2012 1149   ALT 12 09/07/2012 1149   BILITOT 0.3 09/07/2012 1149      .pathology. Radiological Studies: chest X-ray n/a Mammogram 7/13-wnl Bone density Mild osteopenia-stable  Impression and Plan: Dana Coleman is doing well, i will see her in 6 mo, for f/u.she will continue femara; she is not taking fosamax, so I basically have given her a prescription for this and instruction on how to take this. We'll she'll be seen again in followup in 6 months time with appropriate imaging studies. Her last mammogram was within normal limits .  More than 50% of the visit was spent in patient-related counselling   Pierce Crane, MD 12/17/20133:25 PM

## 2012-09-28 NOTE — Telephone Encounter (Signed)
Gave patient instructions that we will call her with the new date and time

## 2012-10-18 ENCOUNTER — Other Ambulatory Visit: Payer: Self-pay | Admitting: Family Medicine

## 2012-12-17 ENCOUNTER — Other Ambulatory Visit: Payer: Self-pay | Admitting: Oncology

## 2012-12-17 DIAGNOSIS — C50919 Malignant neoplasm of unspecified site of unspecified female breast: Secondary | ICD-10-CM

## 2012-12-20 ENCOUNTER — Other Ambulatory Visit: Payer: Self-pay | Admitting: Oncology

## 2012-12-20 DIAGNOSIS — C50911 Malignant neoplasm of unspecified site of right female breast: Secondary | ICD-10-CM

## 2013-01-24 ENCOUNTER — Encounter: Payer: Self-pay | Admitting: Family Medicine

## 2013-01-24 ENCOUNTER — Ambulatory Visit (INDEPENDENT_AMBULATORY_CARE_PROVIDER_SITE_OTHER): Payer: PRIVATE HEALTH INSURANCE | Admitting: Family Medicine

## 2013-01-24 VITALS — BP 117/75 | HR 74 | Temp 98.0°F | Ht 61.5 in | Wt 148.6 lb

## 2013-01-24 DIAGNOSIS — I10 Essential (primary) hypertension: Secondary | ICD-10-CM

## 2013-01-24 DIAGNOSIS — E119 Type 2 diabetes mellitus without complications: Secondary | ICD-10-CM

## 2013-01-24 DIAGNOSIS — B351 Tinea unguium: Secondary | ICD-10-CM

## 2013-01-24 MED ORDER — METFORMIN HCL 500 MG PO TABS: 1000.0000 mg | ORAL_TABLET | Freq: Two times a day (BID) | ORAL | Status: DC

## 2013-01-24 NOTE — Assessment & Plan Note (Signed)
At goal on ARB. F/u in 1-2 months, will check creatinine then.

## 2013-01-24 NOTE — Assessment & Plan Note (Signed)
Improved, completed treatment. Will recheck LFTs next visit.

## 2013-01-24 NOTE — Patient Instructions (Addendum)
Nice to see you. Increase metformin to 1000 mg (2 pills) twice daily.  Will refer to nutrition counseling. Make an appointment for check up in 1-2 months. Best thing to stay strong and healthy is walking most days for exercise.  Diabetes and Exercise Regular exercise is important and can help:   Control blood glucose (sugar).  Decrease blood pressure.    Control blood lipids (cholesterol, triglycerides).  Improve overall health. BENEFITS FROM EXERCISE  Improved fitness.  Improved flexibility.  Improved endurance.  Increased bone density.  Weight control.  Increased muscle strength.  Decreased body fat.  Improvement of the body's use of insulin, a hormone.  Increased insulin sensitivity.  Reduction of insulin needs.  Reduced stress and tension.  Helps you feel better. People with diabetes who add exercise to their lifestyle gain additional benefits, including:  Weight loss.  Reduced appetite.  Improvement of the body's use of blood glucose.  Decreased risk factors for heart disease:  Lowering of cholesterol and triglycerides.  Raising the level of good cholesterol (high-density lipoproteins, HDL).  Lowering blood sugar.  Decreased blood pressure. TYPE 1 DIABETES AND EXERCISE  Exercise will usually lower your blood glucose.  If blood glucose is greater than 240 mg/dl, check urine ketones. If ketones are present, do not exercise.  Location of the insulin injection sites may need to be adjusted with exercise. Avoid injecting insulin into areas of the body that will be exercised. For example, avoid injecting insulin into:  The arms when playing tennis.  The legs when jogging. For more information, discuss this with your caregiver.  Keep a record of:  Food intake.  Type and amount of exercise.  Expected peak times of insulin action.  Blood glucose levels. Do this before, during, and after exercise. Review your records with your caregiver.  This will help you to develop guidelines for adjusting food intake and insulin amounts.  TYPE 2 DIABETES AND EXERCISE  Regular physical activity can help control blood glucose.  Exercise is important because it may:  Increase the body's sensitivity to insulin.  Improve blood glucose control.  Exercise reduces the risk of heart disease. It decreases serum cholesterol and triglycerides. It also lowers blood pressure.  Those who take insulin or oral hypoglycemic agents should watch for signs of hypoglycemia. These signs include dizziness, shaking, sweating, chills, and confusion.  Body water is lost during exercise. It must be replaced. This will help to avoid loss of body fluids (dehydration) or heat stroke. Be sure to talk to your caregiver before starting an exercise program to make sure it is safe for you. Remember, any activity is better than none.  Document Released: 12/20/2003 Document Revised: 12/22/2011 Document Reviewed: 04/05/2009 [facility] Patient Information 2013 [place], [place].

## 2013-01-24 NOTE — Progress Notes (Signed)
  Subjective:    Patient ID: Dana Coleman, female    DOB: May 02, 1941, 72 y.o.   MRN: [id number]  Diabetes    1. F/u DM2. States she feels well, no complaints. Compliant with 2 oral meds. She stopped actos last year.  No problems with her feet. Ophthalmologic exam this March was normal except for cataract.  She doesn't exercise as much as she should. Denies fatigue, hypoglycemia, weight changes, polyruia, polyphagia.  2. HTN. Compliant with meds. Doesn't check at home. No chest pain, edema, dyspnea.  Review of Systems See HPI otherwise negative.  reports that she has never smoked. She does not have any smokeless tobacco history on file.     Objective:   Physical Exam  Vitals reviewed. Constitutional: She is oriented to person, place, and time. She appears well-developed and well-nourished. No distress.  HENT:  Head: Normocephalic and atraumatic.  Mouth/Throat: Oropharynx is clear and moist. No oropharyngeal exudate.  Neck: Neck supple. No thyromegaly present.  Cardiovascular: Normal rate, regular rhythm and normal heart sounds.   No murmur heard. Pulmonary/Chest: Breath sounds normal. No respiratory distress. She has no wheezes.  Musculoskeletal: She exhibits no edema and no tenderness.  Foot exam wnl. Monofilament sensation intact.  Neurological: She is alert and oriented to person, place, and time.  Skin: No rash noted. She is not diaphoretic.  Psychiatric: She has a normal mood and affect.       Assessment & Plan:

## 2013-01-24 NOTE — Assessment & Plan Note (Addendum)
Deteriorated with A1c of 10.0. Discussed with patient, who thinks that she has been unfaithful with her diet, and after stopping Actos due to concern for future side effects. Discussed a reasonable goal being A1c less than 8.0-8.5. We agreed to increase metformin to 1000 mg twice a day. Will refer to diabetic nutrition Center. She will restart walking. We will follow up in 1-2 months. Will consider initiating insulin if patient remains above A1c goal.   Foot and eye exam UTD.

## 2013-02-18 ENCOUNTER — Encounter: Payer: Self-pay | Admitting: *Deleted

## 2013-02-18 ENCOUNTER — Encounter: Payer: PRIVATE HEALTH INSURANCE | Attending: Family Medicine | Admitting: *Deleted

## 2013-02-18 VITALS — Ht 62.0 in | Wt 143.6 lb

## 2013-02-18 DIAGNOSIS — E119 Type 2 diabetes mellitus without complications: Secondary | ICD-10-CM | POA: Insufficient documentation

## 2013-02-18 DIAGNOSIS — Z713 Dietary counseling and surveillance: Secondary | ICD-10-CM | POA: Insufficient documentation

## 2013-02-18 NOTE — Progress Notes (Signed)
  Medical Nutrition Therapy:  Appt start time: 0945 end time:  1045.  Assessment:  Primary concerns today: Diabetes and nutrition counseling. 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 She does her own shopping and food preparation. Does some ironing for people on occasion. Enjoys working in the yard. SMBG infrequently maybe twice a week, stated range of 190 - 250 mg/dl. Weight loss of 5 pounds in past month noted along with increased activity level and stated improved BGs since she was told her A1c was 10.0% on 01/24/2013  MEDICATIONS: see list, diabetes medications are Metformin and Glipizide   DIETARY INTAKE:  Usual eating pattern includes 3 meals and 1-2 snacks per day.  Everyday foods include good variety of all food groups.  Avoided foods include: regular soda.    24-hr recall:  B ( AM): 1 flavored pkg oatmeal, coffee cream and Sweetener Snk ( AM): none  L ( PM): PNB jelly sandwich OR left overs OR tuna sandwich, diet soda Snk ( PM): 2-3 chocolate chip cookies D ( PM): lean meat, starch and vegetable, occasional salad, diet soda Snk ( PM):2 scoops ice cream Beverages: coffee, diet soda, water, occasional unsweetened apple juice  Usual physical activity: walking now 20-30 minutes several times a week.  Estimated energy needs: 1200 calories 135 g carbohydrates 90 g protein 33 g fat  Progress Towards Goal(s):  In progress.   Nutritional Diagnosis:  NB-1.1 Food and nutrition-related knowledge deficit As related to diabetes management.  As evidenced by A1c of 10.0% on 01/24/2013.    Intervention:  Nutrition counseling and diabetes education initiated. Discussed basic physiology of diabetes, Carb Counting and reading food labels. Used basic food groups to teach Carb Counting and she was able to return demonstrate her understanding with use of food models. Explained rationale of spreading her Carb containing foods more evenly throughout the day  to better manage her BG. Also explained that insulin needles are very small and offered for her to see what an injection would feel like. She agreed and gave a sample injection in her abdomen. She expressed relief that it was not painful in case she might need insulin in the future..   Plan:  Aim for 2 Carb Choices per meal (30 grams) +/- 1 either way  Aim for 0-1 Carbs per snack if hungry  Continue  increasing your activity level by walking for 20-30 minutes daily as tolerated   Handouts given during visit include: Living Well with Diabetes Carb Counting and Food Label handouts Meal Plan Card  Monitoring/Evaluation:  Dietary intake, exercise, reading food labels, and body weight prn.

## 2013-02-18 NOTE — Patient Instructions (Signed)
Plan:  Aim for 2 Carb Choices per meal (30 grams) +/- 1 either way  Aim for 0-1 Carbs per snack if hungry  Continue  increasing your activity level by walking for 20-30 minutes daily as tolerated

## 2013-03-01 ENCOUNTER — Telehealth: Payer: Self-pay | Admitting: *Deleted

## 2013-03-01 ENCOUNTER — Encounter: Payer: Self-pay | Admitting: Oncology

## 2013-03-01 NOTE — Telephone Encounter (Signed)
Received a call from pt in question of who her new provider was and I confirmed 04/04/13 appt w/ pt.  Mailed letter & calendar to pt.

## 2013-03-12 ENCOUNTER — Other Ambulatory Visit: Payer: Self-pay | Admitting: Family Medicine

## 2013-03-15 ENCOUNTER — Other Ambulatory Visit: Payer: Self-pay | Admitting: Family Medicine

## 2013-03-28 ENCOUNTER — Encounter: Payer: Self-pay | Admitting: Family Medicine

## 2013-03-28 ENCOUNTER — Ambulatory Visit (INDEPENDENT_AMBULATORY_CARE_PROVIDER_SITE_OTHER): Payer: PRIVATE HEALTH INSURANCE | Admitting: Family Medicine

## 2013-03-28 VITALS — BP 108/61 | HR 74 | Temp 98.1°F | Ht 62.0 in | Wt 142.9 lb

## 2013-03-28 DIAGNOSIS — E119 Type 2 diabetes mellitus without complications: Secondary | ICD-10-CM

## 2013-03-28 LAB — POCT GLYCOSYLATED HEMOGLOBIN (HGB A1C): Hemoglobin A1C: 8.7

## 2013-03-28 NOTE — Patient Instructions (Addendum)
Your A1c is improved to goal.  At next visit in 3 months, do not eat or drink 8 hours prior so we can check fasting labs.  Keep up with your new healthy lifestyle! You are doing great!!

## 2013-03-28 NOTE — Progress Notes (Signed)
  Subjective:    Patient ID: Dana Coleman, female    DOB: 03/03/1941, 72 y.o.   MRN: [id number]  Diabetes    1. DM2 f/u. Patient returns to discuss her diabetes after last visit her A1c had worsened to 10.0. Since then, she has seen nutritionist, started exercising more regularly and lost 10 lbs. Is watching her diet and avoiding sweets. She is tolerating increased dose of metformin well. States her typical fasting CBGs are 160-180 recently.   No polyuria, polydipsia, GI symptoms, hypoglycemia, fatigue.  Review of Systems See HPI otherwise negative.  reports that she has never smoked. She does not have any smokeless tobacco history on file.     Objective:   Physical Exam  Constitutional: She is oriented to person, place, and time. She appears well-developed and well-nourished. No distress.  Pulmonary/Chest: Effort normal.  Neurological: She is alert and oriented to person, place, and time.  Skin: She is not diaphoretic.  Psychiatric: She has a normal mood and affect.        Assessment & Plan:

## 2013-03-28 NOTE — Assessment & Plan Note (Addendum)
Improved A1c to goal range on increased metformin and significant lifestyle changes. Will continue current therapy. We discussed options that if her A1c exceeds goal of 9.0, could consider starting januvia or alternative DPP-VI inhibitor since her recent renal function is adequate. Will check fasting labs and she agrees to make appt for this.    Estimated Creatinine Clearance: 51.3 ml/min.

## 2013-04-04 ENCOUNTER — Encounter: Payer: Self-pay | Admitting: Family

## 2013-04-04 ENCOUNTER — Ambulatory Visit (HOSPITAL_BASED_OUTPATIENT_CLINIC_OR_DEPARTMENT_OTHER): Payer: PRIVATE HEALTH INSURANCE | Admitting: Family

## 2013-04-04 ENCOUNTER — Telehealth: Payer: Self-pay | Admitting: Oncology

## 2013-04-04 ENCOUNTER — Ambulatory Visit (HOSPITAL_BASED_OUTPATIENT_CLINIC_OR_DEPARTMENT_OTHER): Payer: PRIVATE HEALTH INSURANCE | Admitting: Lab

## 2013-04-04 VITALS — BP 120/76 | HR 72 | Temp 99.0°F | Resp 20 | Ht 62.0 in | Wt 143.5 lb

## 2013-04-04 DIAGNOSIS — C50911 Malignant neoplasm of unspecified site of right female breast: Secondary | ICD-10-CM

## 2013-04-04 DIAGNOSIS — C50919 Malignant neoplasm of unspecified site of unspecified female breast: Secondary | ICD-10-CM

## 2013-04-04 DIAGNOSIS — E559 Vitamin D deficiency, unspecified: Secondary | ICD-10-CM

## 2013-04-04 DIAGNOSIS — Z853 Personal history of malignant neoplasm of breast: Secondary | ICD-10-CM

## 2013-04-04 LAB — CBC WITH DIFFERENTIAL/PLATELET
BASO%: 0.4 % (ref 0.0–2.0)
EOS%: 1.1 % (ref 0.0–7.0)
MCH: 29.9 pg (ref 25.1–34.0)
MCHC: 33.5 g/dL (ref 31.5–36.0)
RBC: 3.94 10*6/uL (ref 3.70–5.45)
RDW: 14 % (ref 11.2–14.5)
WBC: 9.1 10*3/uL (ref 3.9–10.3)
lymph#: 2.5 10*3/uL (ref 0.9–3.3)

## 2013-04-04 LAB — COMPREHENSIVE METABOLIC PANEL (CC13)
ALT: 16 U/L (ref 0–55)
AST: 18 U/L (ref 5–34)
Alkaline Phosphatase: 62 U/L (ref 40–150)
Glucose: 162 mg/dl — ABNORMAL HIGH (ref 70–99)
Sodium: 138 mEq/L (ref 136–145)
Total Bilirubin: 0.22 mg/dL (ref 0.20–1.20)
Total Protein: 7.8 g/dL (ref 6.4–8.3)

## 2013-04-04 LAB — LACTATE DEHYDROGENASE (CC13): LDH: 168 U/L (ref 125–245)

## 2013-04-04 MED ORDER — LETROZOLE 2.5 MG PO TABS: 2.5000 mg | ORAL_TABLET | Freq: Every day | ORAL | Status: DC

## 2013-04-04 NOTE — Progress Notes (Addendum)
[facility]  [phone] Fax:([phone]) [phone]  OFFICE PROGRESS NOTE   ID: Dana Coleman   DOB: [birth year]  MR#: [id number]  JYN#:[id number]   PCP: Dana Huger, MD SU: Dana Kelp, MD   HISTORY OF PRESENT ILLNESS: From Dr. Pierce Coleman new patient evaluation note dated 06/28/2008: "This is a 72 year old from [place] here today with her granddaughter, Dana Coleman, to discuss evaluation and treatment of breast cancer This woman has been having a mammogram every year.  She had a mammogram on 05/02/08 that showed a possible mass in the right breast.  Additional views were recommended.  Digital mammogram and right breast ultrasound performed 05/09/08 showed a small solid nodule at 10 o'clock 8 cm from the nipple measuring 7 x 4 x 6 mm.  Biopsy was recommended.  This was performed the same day and was found to be an invasive ductal carcinoma with extracellular mucin.  This was found to be ER and PR positive at 95% and 56% respectively, proliferative index was 12%, and HER-2 was 0.  Bilateral MRI scans were performed 05/18/08, these showed a 9 x 6 x 5 mm carcinoma outer quadrant right breast.  No other abnormalities were seen.  Patient elected to undergo lumpectomy and sentinel lymph node evaluation on 06/12/08 and this showed a 0.8 cm and grade 1 of 3 invasive ductal carcinoma with mucinous features.  Sentinel lymph node was negative for tumor.  Margins were widely clear.  Lymphovascular invasion was not seen.  Patient has had an unremarkable postoperative course."  Her subsequent history is as detailed below   INTERVAL HISTORY: Dr. Darnelle Coleman and I saw Dana Coleman today for followup of invasive mucinous carcinoma of right breast.  The patient was last seen by Dr. Donnie Coleman on 09/28/2012.  Since her last office visit, the patient has been doing relatively well.  She is establishing herself with Dr. Darrall Coleman service today.   REVIEW OF SYSTEMS: A 10 point review of systems  was completed and is negative except ongoing night sweats.  The patient denies any other symptomatology.   PAST MEDICAL HISTORY: Past Medical History  Diagnosis Date  . Diabetes mellitus without complication   . Hypertension   . Breast cancer 04/2008    Right breast  . Hyperlipidemia   . Heart attack 2004  . Cervical cancer     When the patient was in her 30s  . 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 Cataracts, bilateral   Include myocardial infarction in 2004 followed by Dr. Donnie Coleman, history of adult onset diabetes and hypertension  PAST SURGICAL HISTORY: Past Surgical History  Procedure Laterality Date  . Cervix surgery    . Thyroid      for nodule hemithyroidectomy, benign  . Breast surgery  2009    right lumpectomy  . Thyroidectomy Right 03/2003  Includes carcinoma of the cervix, radiation given to the pelvis in her 30s.  There was a Hrthle cell adenoma removed in June 2004 for which she underwent a right thyroidectomy.  FAMILY HISTORY Family History  Problem Relation Age of Onset  . Heart disease Mother     age 10  . Heart disease Father   . Cancer Father     unknown  . Heart disease Brother   . Hypertension Brother   . Diabetes Daughter   . Cirrhosis Brother   Parents deceased.  She has three sisters and one brother who are living.  One brother died of liver cirrhosis.  There is no other history  of breast or ovarian cancer in the family.  GYNECOLOGIC HISTORY: G5, P5, menarche age 3, menopausal after radiation to the pelvis.   SOCIAL HISTORY: Ms. Depp is married but has been separated from her husband Dana Coleman for over 30 years.  She states that her estranged husband has dementia and lives in a nursing home in [place].  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 She has five adult children of which one lives in [place], one lives in [place], and 3 of her children live locally in [place], [place].  She has 10 grandchildren  and 10 great grandchildren.  In her spare time she enjoys shopping and states she is very active in her church.    ADVANCED DIRECTIVES: Not in place  HEALTH MAINTENANCE: History  Substance Use Topics  . Smoking status: Never Smoker   . Smokeless tobacco: Never Used  . Alcohol Use: No    Colonoscopy: 06/26/2009 PAP: 04/21/2008 Bone density: The patient had a bone density scan on 05/10/2012 which showed a T score of -2.0  (osteopenia).  Lipid panel: 03/28/2013  No Known Allergies  Current Outpatient Prescriptions  Medication Sig Dispense Refill  . acetaminophen (TYLENOL) 500 MG tablet 2 tablets by mouth three times a day as needed for your hands       . alendronate (FOSAMAX) 70 MG tablet Take 1 tablet (70 mg total) by mouth every 7 (seven) days. Take with a full glass of water on an empty stomach.  12 tablet  4  . amLODipine (NORVASC) 10 MG tablet TAKE 1 TABLET (10 MG TOTAL) BY MOUTH DAILY.  30 tablet  11  . aspirin 81 MG chewable tablet Chew 81 mg by mouth daily.        Marland Kitchen BENICAR 40 MG tablet TAKE 1 TABLET (40 MG TOTAL) BY MOUTH DAILY. FOR BLOOD PRESSURE  30 tablet  5  . Blood Glucose Monitoring Suppl MISC One touch ultra glucose monitor. Check blood sugar twice a day.       . cholecalciferol 2000 UNITS tablet 1 tablet (2,000 Units total). 1 tablet by mouth daily after finishing 8 week repletion      . CRESTOR 40 MG tablet TAKE 1 TABLET (40 MG TOTAL) BY MOUTH AT BEDTIME. FOR CHOLESTEROL  30 tablet  11  . glipiZIDE (GLUCOTROL XL) 10 MG 24 hr tablet TAKE 2 TABLETS (20 MG TOTAL) BY MOUTH DAILY.  60 tablet  11  . glucose blood (ONE TOUCH ULTRA TEST) test strip Check blood sugar twice daily.       . hydrochlorothiazide (HYDRODIURIL) 25 MG tablet TAKE 1 TABLET (25 MG TOTAL) BY MOUTH DAILY.  30 tablet  11  . Lancets MISC Use as directed.       Marland Kitchen letrozole (FEMARA) 2.5 MG tablet Take 1 tablet (2.5 mg total) by mouth daily.  90 tablet  3  . metFORMIN (GLUCOPHAGE) 500 MG tablet Take 2 tablets  (1,000 mg total) by mouth 2 (two) times daily with a meal.  180 tablet  3  . nitroGLYCERIN (NITROLINGUAL) 0.4 MG/SPRAY spray As needed        No current facility-administered medications for this visit.    OBJECTIVE: Elderly African American female in no acute distress. Filed Vitals:   04/04/13 1407  BP: 120/76  Pulse: 72  Temp: 99 F (37.2 C)  Resp: 20     Body mass index is 26.24 kg/(m^2).      ECOG FS: 1 -  Symptomatic but completely ambulatory  General appearance: Alert, cooperative, well nourished, no apparent distress Head: Normocephalic, without obvious abnormality, atraumatic Eyes: Arcus senilis, PERRLA, EOMI Nose: Nares, septum and mucosa are normal, no drainage or sinus tenderness Neck: No adenopathy, supple, symmetrical, trachea midline, partial thyroid not enlarged, no tenderness Resp: Clear to auscultation bilaterally Cardio: Regular rate and rhythm, S1, S2 normal, no murmur, click, rub or gallop Breasts: Right breast is visibly smaller than her left breast, right breast has well-healed surgical scars and architectural changes are noted, breast tissue is glandular bilaterally, right breast has a retracted nipple, left breast is pendulous, firm inframammary ridge bilaterally, no axilla fullness GI: Soft, distended, non-tender, hypoactive bowel sounds, no organomegaly Extremities: Extremities normal, atraumatic, no cyanosis or edema Lymph nodes: Cervical, supraclavicular, and axillary nodes normal Neurologic: Grossly normal    LAB RESULTS: Lab Results  Component Value Date   WBC 9.1 04/04/2013   NEUTROABS 5.7 04/04/2013   HGB 11.8 04/04/2013   HCT 35.1 04/04/2013   MCV 89.0 04/04/2013   PLT 207 04/04/2013      Chemistry      Component Value Date/Time   NA 138 04/04/2013 1611   NA 141 09/07/2012 1149   K 4.2 04/04/2013 1611   K 4.3 09/07/2012 1149   CL 105 04/04/2013 1611   CL 102 09/07/2012 1149   CO2 22 04/04/2013 1611   CO2 29 09/07/2012 1149   BUN 12.3  04/04/2013 1611   BUN 13 09/07/2012 1149   CREATININE 0.8 04/04/2013 1611   CREATININE 0.89 09/07/2012 1149   CREATININE 0.93 03/29/2012 1351      Component Value Date/Time   CALCIUM 10.0 04/04/2013 1611   CALCIUM 9.7 09/07/2012 1149   ALKPHOS 62 04/04/2013 1611   ALKPHOS 69 09/07/2012 1149   AST 18 04/04/2013 1611   AST 19 09/07/2012 1149   ALT 16 04/04/2013 1611   ALT 12 09/07/2012 1149   BILITOT 0.22 04/04/2013 1611   BILITOT 0.3 09/07/2012 1149       Lab Results  Component Value Date   LABCA2 21 09/18/2011    Urinalysis No results found for this basename: colorurine,  appearanceur,  labspec,  phurine,  glucoseu,  hgbur,  bilirubinur,  ketonesur,  proteinur,  urobilinogen,  nitrite,  leukocytesur    STUDIES: 1.  The patient's last bilateral digital diagnostic mammogram on 05/10/2012 showed scattered fibroglandular densities.  Right lumpectomy changes are present.  There is no suspicious dominant mass, nonsurgical architectural distortion or calcification to suggest malignancy.  No mammographic evidence of malignancy.  Benign findings.  2.  The patient had a bone density scan on 05/10/2012 which showed a T score of -2.0  (osteopenia).    ASSESSMENT: 72 y.o[place]:  1.  Status post right breast needle core biopsy at the 10 o'clock position on 05/09/2008 which showed invasive ductal carcinoma with abundant extracellular mucin, estrogen receptor 95% positive, progesterone receptor 56% positive, Ki-67 12%, HER-2/neu negative.  2.  Status post bilateral breast MRI on 05/18/2008 which showed mild background parenchymal enhancement in both breasts.  Linear biopsy tract within the upper outer quadrant of the right breast.  There was a small area of nodular enhancement that measured 9 x 6 x 5 mm in maximum dimensions.  No other masses or areas of abnormal enhancement suspicious for malignancy in either breast.  No abnormal appearing lymph nodes.  3.  Status post  right breast wire/needle localized lumpectomy with right axillary sentinel node biopsy  on 06/12/2008 for a stage I, pT1b pN0 (i-) (sn), 0.8 cm invasive mucinous carcinoma grade 1 with low-grade ductal carcinoma in situ without necrosis and benign breast tissue with foci of fibrosis and ductal ectasia, estrogen receptor 95% positive, progesterone receptor 56% positive, Ki-67 12%, HER-2/neu negative, with 0/1 metastatic right axillary lymph nodes.  4.  Status post radiation therapy from 10/607/2009 through 08/29/2008.    5.  The patient started antiestrogen therapy with Femara and 09/2008.  The patient has also been taking Fosamax since 09/2008.   PLAN:  Ms. Laverdure will continue antiestrogen therapy with Femara.  An electronic prescription for Femara (Letrozole) 2.5 mg by mouth daily #90 with 3 refills was sent to the patient's pharmacy. She will continue to take antiestrogen therapy with Femara until 09/2013 at which time she will have completed 5 years of antiestrogen therapy.   The patient will be due for her annual bilateral digital diagnostic mammogram in 04/2013 and we will schedule this for her.  We plan to see Ms. Fleming again in 08/2013 at which time (provided her mammogram does not show any abnormalities), she will be eligible to graduate [facility].  We will check a CBC, CMP, LDH, and vitamin D level during her next office visit.  All questions were answered.  The patient was encouraged to contact us in the interim with any problems, questions or concerns.   Larina Bras, NP-C 04/04/2013, 6:35 PM  ADDENDUM: This 72 year old [place] woman underwent right lumpectomy and sentinel lymph node sampling August of 2009 for a pT1b pN0, stagte IA invasive mucinous breast cancer, low-grade, estrogen receptor 95% positive, progesterone receptor 56% positive, with an MIB-1 of 12% and no HER-2 amplification. She completed adjuvant radiation therapy November of 2009 and  started letrozole (as well as Fosamax) December of 2009.  Today we discussed her diagnosis, treatment history, and prognosis. She understands her prognosis is excellent. She is going to complete her 5 years of antiestrogen therapy in December, and we are going to see her in November at which time she will likely "graduate" from followup here. She will also stop Fosamax at that time. She knows to call for any problems that may develop before the next visit here or   I personally saw this patient and performed a substantive portion of this encounter with the listed APP documented above.   Lowella Dell, MD

## 2013-04-04 NOTE — Patient Instructions (Addendum)
Please contact us at [phone] if you have any questions or concerns.  Please continue to do well and enjoy life!!!  Get plenty of rest, drink plenty of water, exercise daily (walking), eat a balanced diet.  Continue to take Fosamax as prescribed and  Vitamin D3 daily.

## 2013-04-05 LAB — VITAMIN D 25 HYDROXY (VIT D DEFICIENCY, FRACTURES): Vit D, 25-Hydroxy: 44 ng/mL (ref 30–89)

## 2013-04-06 ENCOUNTER — Telehealth: Payer: Self-pay

## 2013-04-06 NOTE — Telephone Encounter (Signed)
Spoke with pt regarding lab results from 6/23. Per [facility], labs are ok. Glucose slightly elevated. Advised pt to monitor her glucose and sugar intake closely. Also, continue taking Vit D. Pt voiced understanding and knows to call the office with any questions. TMB

## 2013-04-10 ENCOUNTER — Other Ambulatory Visit: Payer: Self-pay | Admitting: Family Medicine

## 2013-04-11 ENCOUNTER — Other Ambulatory Visit: Payer: Self-pay | Admitting: Family Medicine

## 2013-04-11 ENCOUNTER — Other Ambulatory Visit: Payer: PRIVATE HEALTH INSURANCE

## 2013-04-11 DIAGNOSIS — E119 Type 2 diabetes mellitus without complications: Secondary | ICD-10-CM

## 2013-04-11 LAB — LIPID PANEL
LDL Cholesterol: 77 mg/dL (ref 0–99)
Triglycerides: 118 mg/dL (ref <150)
VLDL: 24 mg/dL (ref 0–40)

## 2013-04-11 NOTE — Progress Notes (Signed)
FLP DONE TODAY Dana Coleman 

## 2013-05-12 ENCOUNTER — Other Ambulatory Visit: Payer: Self-pay | Admitting: *Deleted

## 2013-05-13 ENCOUNTER — Other Ambulatory Visit: Payer: Self-pay | Admitting: *Deleted

## 2013-05-13 MED ORDER — OLMESARTAN MEDOXOMIL 40 MG PO TABS: ORAL_TABLET | ORAL | Status: DC

## 2013-05-13 MED ORDER — ROSUVASTATIN CALCIUM 40 MG PO TABS: ORAL_TABLET | ORAL | Status: DC

## 2013-05-13 MED ORDER — HYDROCHLOROTHIAZIDE 25 MG PO TABS: ORAL_TABLET | ORAL | Status: DC

## 2013-05-13 MED ORDER — GLIPIZIDE ER 10 MG PO TB24: ORAL_TABLET | ORAL | Status: DC

## 2013-05-13 MED ORDER — AMLODIPINE BESYLATE 10 MG PO TABS: ORAL_TABLET | ORAL | Status: DC

## 2013-05-16 ENCOUNTER — Ambulatory Visit
Admission: RE | Admit: 2013-05-16 | Discharge: 2013-05-16 | Disposition: A | Discharge status: Home / Self Care | Payer: PRIVATE HEALTH INSURANCE | Source: Ambulatory Visit | Attending: Family | Admitting: Family

## 2013-05-16 ENCOUNTER — Other Ambulatory Visit: Payer: Self-pay | Admitting: *Deleted

## 2013-05-16 DIAGNOSIS — Z853 Personal history of malignant neoplasm of breast: Secondary | ICD-10-CM

## 2013-05-16 DIAGNOSIS — C50911 Malignant neoplasm of unspecified site of right female breast: Secondary | ICD-10-CM

## 2013-05-16 MED ORDER — HYDROCHLOROTHIAZIDE 25 MG PO TABS: ORAL_TABLET | ORAL | Status: DC

## 2013-05-16 MED ORDER — ROSUVASTATIN CALCIUM 40 MG PO TABS: ORAL_TABLET | ORAL | Status: DC

## 2013-05-16 MED ORDER — GLIPIZIDE ER 10 MG PO TB24: ORAL_TABLET | ORAL | Status: DC

## 2013-05-16 MED ORDER — OLMESARTAN MEDOXOMIL 40 MG PO TABS: ORAL_TABLET | ORAL | Status: DC

## 2013-05-16 MED ORDER — AMLODIPINE BESYLATE 10 MG PO TABS: ORAL_TABLET | ORAL | Status: DC

## 2013-05-16 NOTE — Telephone Encounter (Signed)
Pt is requesting 90 day supply on all meds. Wyatt Haste, RN-BSN

## 2013-05-20 ENCOUNTER — Telehealth: Payer: Self-pay | Admitting: Family

## 2013-05-20 NOTE — Telephone Encounter (Signed)
Spoke to Dana Coleman and explained to her that her recent mammogram showed no mammographic evidence of malignancy.  The patient voiced understanding.

## 2013-05-24 ENCOUNTER — Encounter: Payer: Self-pay | Admitting: Family Medicine

## 2013-05-24 NOTE — Telephone Encounter (Signed)
This encounter was created in error - please disregard.

## 2013-06-16 ENCOUNTER — Ambulatory Visit (INDEPENDENT_AMBULATORY_CARE_PROVIDER_SITE_OTHER): Payer: PRIVATE HEALTH INSURANCE | Admitting: Family Medicine

## 2013-06-16 ENCOUNTER — Encounter: Payer: Self-pay | Admitting: Family Medicine

## 2013-06-16 VITALS — BP 123/77 | HR 80 | Temp 98.7°F | Ht 62.0 in | Wt 141.9 lb

## 2013-06-16 DIAGNOSIS — E119 Type 2 diabetes mellitus without complications: Secondary | ICD-10-CM

## 2013-06-16 DIAGNOSIS — I2510 Atherosclerotic heart disease of native coronary artery without angina pectoris: Secondary | ICD-10-CM

## 2013-06-16 DIAGNOSIS — E7800 Pure hypercholesterolemia, unspecified: Secondary | ICD-10-CM

## 2013-06-16 DIAGNOSIS — I10 Essential (primary) hypertension: Secondary | ICD-10-CM

## 2013-06-16 LAB — POCT GLYCOSYLATED HEMOGLOBIN (HGB A1C): Hemoglobin A1C: 8.9

## 2013-06-16 MED ORDER — AMLODIPINE BESYLATE 10 MG PO TABS: ORAL_TABLET | ORAL | Status: DC

## 2013-06-16 MED ORDER — ROSUVASTATIN CALCIUM 40 MG PO TABS: ORAL_TABLET | ORAL | Status: DC

## 2013-06-16 MED ORDER — ASPIRIN 81 MG PO CHEW: 81.0000 mg | CHEWABLE_TABLET | Freq: Every day | ORAL | Status: DC

## 2013-06-16 MED ORDER — HYDROCHLOROTHIAZIDE 25 MG PO TABS: ORAL_TABLET | ORAL | Status: DC

## 2013-06-16 MED ORDER — OLMESARTAN MEDOXOMIL 40 MG PO TABS: ORAL_TABLET | ORAL | Status: DC

## 2013-06-16 NOTE — Progress Notes (Signed)
  Subjective:     Dana Coleman is a 72 y.o. female who presents for follow up of diabetes. and for medication refills.   She has no complaints at this time. She has been walking 20-30 minutes everyday and following advice from nutritionist to have a diet mostly of vegetables and fruit with some protein. Says she checks her sugars in the AM only occasionally and they are <160 with 1 mild episode of hypoglycemia. She has been losing weight intentionally. She takes all medications as directed.    Review of Systems As above   Objective:    BP 123/77  Pulse 80  Temp(Src) 98.7 F (37.1 C) (Oral)  Ht 5\' 2"  (1.575 m)  Wt 141 lb 14.4 oz (64.365 kg)  BMI 25.95 kg/m2  General Appearance:    Alert, cooperative, no distress, appears stated age  Head:    Normocephalic, without obvious abnormality, atraumatic  Eyes:    PERRL, EOM intact, arcus senilis bilaterally  Ears:    Normal TM's and external ear canals, both ears  Throat:  Dentures top and bottom, oropharynx clear, tongue normal  Neck:   Supple, symmetrical, trachea midline, no adenopathy;    thyroid:  no enlargement/tenderness/nodules; no carotid   bruit or JVD  Back:     Symmetric, no curvature, ROM normal, no CVA tenderness  Lungs:     Clear to auscultation bilaterally, respirations unlabored   Heart:    Regular rate and rhythm, S1 and S2 normal, no murmur, rub   or gallop  Breast Exam:    No tenderness, masses, or nipple abnormality  Abdomen:     Soft, non-tender, bowel sounds active all four quadrants,    no masses, no organomegaly  Extremities:   Extremities normal, atraumatic, no cyanosis or edema  Pulses:   2+ and symmetric all extremities  Skin:   Skin color, texture, turgor normal, no rashes or lesions  Lymph nodes:   Cervical, supraclavicular, and axillary nodes normal  Neurologic:   CNII-XII intact, normal strength, sensation and reflexes    throughout    Laboratory:   A1c: 8.9  Assessment:  Dana Coleman is a  72 yo female here for diabetes follow up and medication refills.   Diabetes mellitus Type II, under unsatisfactory control.    Plan:    Discussed general issues about diabetes pathophysiology and management. Counseling at today's visit: focused on the need for regular aerobic exercise, discussed the advantages of a diet low in carbohydrates and discussed DASH diet. Encouraged aerobic exercise. Follow up in 1 month or as needed. Referral to Dr. Raymondo Band to assess for insulin therapy   With long term treatment with diet/exercise, increasing doses of metformin and glipizide, and A1c still not at goal, she is likely becoming insulin dependent due to pancreatic insufficiency.

## 2013-06-16 NOTE — Patient Instructions (Signed)
  Place diabetes mellitus type 2 patient instructions here.  Thank you for enrolling in MyChart. Please follow the instructions below to securely access your online medical record. MyChart allows you to send messages to your doctor, view your test results, manage appointments, and more.   How Do I Sign Up? 1. In your Internet browser, go to [employer] and enter [email or web address]. 2. Click on the Sign Up Now link in the Sign In box. You will see the New Member Sign Up page. 3. Enter your MyChart Access Code exactly as it appears below. You will not need to use this code after you've completed the sign-up process. If you do not sign up before the expiration date, you must request a new code. MyChart Access Code: [email or web address] Expires: 07/16/2013  2:27 PM  4. Enter your Social Security Number ([id number]) and Date of Birth (mm/dd/yyyy) as indicated and click Submit. You will be taken to the next sign-up page. 5. Create a MyChart ID. This will be your MyChart login ID and cannot be changed, so think of one that is secure and easy to remember. 6. Create a MyChart password. You can change your password at any time. 7. Enter your Password Reset Question and Answer. This can be used at a later time if you forget your password.  8. Enter your e-mail address. You will receive e-mail notification when new information is available in MyChart. 9. Click Sign Up. You can now view your medical record.   Additional Information Remember, MyChart is NOT to be used for urgent needs. For medical emergencies, dial 911.

## 2013-06-28 ENCOUNTER — Ambulatory Visit (INDEPENDENT_AMBULATORY_CARE_PROVIDER_SITE_OTHER): Payer: PRIVATE HEALTH INSURANCE | Admitting: Pharmacist

## 2013-06-28 ENCOUNTER — Encounter: Payer: Self-pay | Admitting: Pharmacist

## 2013-06-28 VITALS — BP 108/73 | HR 71 | Ht 61.5 in | Wt 141.2 lb

## 2013-06-28 DIAGNOSIS — E119 Type 2 diabetes mellitus without complications: Secondary | ICD-10-CM

## 2013-06-28 DIAGNOSIS — Z23 Encounter for immunization: Secondary | ICD-10-CM

## 2013-06-28 MED ORDER — CANAGLIFLOZIN 100 MG PO TABS: 100.0000 mg | ORAL_TABLET | Freq: Every day | ORAL | Status: DC

## 2013-06-28 MED ORDER — OLMESARTAN MEDOXOMIL-HCTZ 40-12.5 MG PO TABS: 1.0000 | ORAL_TABLET | Freq: Every day | ORAL | Status: DC

## 2013-06-28 MED ORDER — METFORMIN HCL ER 500 MG PO TB24: 500.0000 mg | ORAL_TABLET | Freq: Every day | ORAL | Status: DC

## 2013-06-28 NOTE — Assessment & Plan Note (Addendum)
Diabetes diagnosed greater than 5 years currently on glipizide 10 mg XR 10 mg once daily and metformin 500 mg once daily. Denies hypoglycemic events except for one glucose reading of 66 many months ago and is able to verbalize appropriate hypoglycemia management plan.  Reports adherence with medication, but complains of GI upset which may be correlated with metformin and pill burden. She fears having to start insulin therapy.  With an A1C of 8.9, control is suboptimal due to possible insulin deficiency. However, patient has been making great progress with her diet, exercise, and weight loss. When asked about weight loss goals, she would like to be 135 lbs in 3 months. Based on her BP of 108/73, it is reasonable to reduce her BP medication dose at this time, as she continues to lose weight. We will make three changes: 1) Metformin will be changed to 500 mg XR to possibly reduce GI upset symptoms 2) Hydrochlorothiazide 25 mg + Olmesartan 40 mg will be combined into a Benicar HCT 40/12.5 mg tablet to reduce pill burden and medication dosage 3) We will add Invokana 100 mg once daily to provide for additional diabetes coverage. Educated patient on new medication, provided drug savings card, written patient instructions provided.  Follow up in Pharmacist Clinic Visit in 1 month.   Total time in face to face counseling 45 minutes.  Patient seen with Anthony Sar, PharmD Resident, and Georga Kaufmann, PharmD Resident.

## 2013-06-28 NOTE — Patient Instructions (Addendum)
Thanks for visiting [place] today!  You are making great progress with your diet and exercise. We will be making a couple changes today: 1) Metformin 500 mg XR once daily. This is a longer acting form of the metformin, which will hopefully reduce the stomach upset. If it does occur, however, please call the office and we will discuss the possibility of discontinuing the medication 2) We will be combining your hydrochlorothiazide and your Benicar (olmesartan). We are decreasing the hydrochlorothiazide to 12.5mg  (1/2 of the tablets you have at home). Take 1/2 of the hydrochlorothiazide and Benicar until you run out of one of them. We sent a prescription to your pharmacy for a pill with both medicines in one pill (Benicar HCT).  3) We added Invokana 100mg  1 pill before the first meal of the day to help with your blood sugar.  Next visit with [place] will be in 1 month. We will call you in one week to schedule your appointment.

## 2013-06-28 NOTE — Progress Notes (Signed)
S:    Patient arrives in good spirits today but admits to being somewhat anxious for her visit today, in which she later reveals is the possibility of having to start insulin therapy. She also immediately admits to having GI upset which has been bothering her. She presents for diabetes follow up. At her last visit, patient verbalized actively exercising (20-30 minutes of walking every day) and having spoken to the nutritionist, an attempt to eat healthier. Today she says she has been staying consistent with those changes. Her weight (141 lb) has been trending down. 24 hr diet  recall:  Breakfast - oatmeal, cereal, banana Lunch  - sandwich, diet coke or water Dinner - broccoli, pork chop, rice, apples Desserts - does not eat dessert often Social History: Patient has 4 girls + 1 boy (of those children, one is a 48 yo with DM "out of control" and a 58 yo daughter with schizophrenia Patient reports having history of Diabetes greater than 5 years. DM2 meds: Glipizide 10 mg XR 10 mg once daily and Metformin 500 mg once daily. HTN meds: she is currently on amlodipine 10 mg once daily, HCTZ 25 mg once daily, and olmesartan 40 mg once daily. She says she "doesn't like taking so many pills."   O: BP 108/73 Pulse 71 Weight 141.2  Patient has had diabetes for greater than 5 years, on glipizide 10 mg XR 10 mg once daily and metformin 500 mg once daily Lab Results  Component Value Date   HGBA1C 8.9 06/16/2013    And home fasting CBG readings of 150s-200s, which various readings of 230 once or twice.   A/P: Diabetes diagnosed greater than 5 years currently on glipizide 10 mg XR 10 mg once daily and metformin 500 mg once daily. Denies hypoglycemic events except for one glucose reading of 66 many months ago and is able to verbalize appropriate hypoglycemia management plan.  Reports adherence with medication, but complains of GI upset which may be correlated with metformin and pill burden. She fears having to  start insulin therapy.  With an A1C of 8.9, control is suboptimal due to possible insulin deficiency. However, patient has been making great progress with her diet, exercise, and weight loss. When asked about weight loss goals, she would like to be 135 lbs in 3 months. Based on her BP of 108/73, it is reasonable to reduce her BP medication dose at this time, as she continues to lose weight. We will make three changes: 1) Metformin will be changed to 500 mg XR to possibly reduce GI upset symptoms 2) Hydrochlorothiazide 25 mg + Olmesartan 40 mg will be combined into a Benicar HCT 40/12.5 mg tablet to reduce pill burden and medication dosage 3) We will add Invokana 100 mg once daily to provide for additional diabetes coverage. Educated patient on new medication, provided drug savings card, written patient instructions provided.  Follow up in Pharmacist Clinic Visit in 1 month.   Total time in face to face counseling 45 minutes.  Patient seen with Anthony Sar, PharmD Resident, and Georga Kaufmann, PharmD Resident.

## 2013-06-29 DIAGNOSIS — Z23 Encounter for immunization: Secondary | ICD-10-CM

## 2013-06-29 NOTE — Progress Notes (Signed)
Patient ID: Dana Coleman, female   DOB: Nov 23, 1940, 72 y.o.   MRN: [id number] Reviewed: Agree with Dr. Macky Lower documentation and management.

## 2013-06-30 ENCOUNTER — Telehealth: Payer: Self-pay | Admitting: Family Medicine

## 2013-06-30 NOTE — Telephone Encounter (Signed)
Pt called because the medication that Dr. Raymondo Band prescribed is 399.00 Inokana. She can not afford this at all. She would like to know if there is something cheaper or do we have any other place that this might be cheaper and if we have any rebate or coupons for this to offset the cost. JW

## 2013-07-01 ENCOUNTER — Telehealth: Payer: Self-pay | Admitting: Pharmacist

## 2013-07-01 NOTE — Telephone Encounter (Signed)
Patient verbalized that copay card did NOT work and the cost was going to be $399 per month.   Patient wiling to return to office to consider other options.  Patient will call to make an appt for Rx Clinic.

## 2013-07-14 ENCOUNTER — Ambulatory Visit (INDEPENDENT_AMBULATORY_CARE_PROVIDER_SITE_OTHER): Payer: PRIVATE HEALTH INSURANCE | Admitting: Family Medicine

## 2013-07-14 ENCOUNTER — Encounter: Payer: Self-pay | Admitting: Family Medicine

## 2013-07-14 VITALS — BP 96/56 | HR 88 | Temp 99.2°F | Ht 61.5 in | Wt 140.0 lb

## 2013-07-14 DIAGNOSIS — E119 Type 2 diabetes mellitus without complications: Secondary | ICD-10-CM

## 2013-07-14 DIAGNOSIS — IMO0002 Reserved for concepts with insufficient information to code with codable children: Secondary | ICD-10-CM

## 2013-07-14 DIAGNOSIS — E1165 Type 2 diabetes mellitus with hyperglycemia: Secondary | ICD-10-CM

## 2013-07-14 DIAGNOSIS — I10 Essential (primary) hypertension: Secondary | ICD-10-CM

## 2013-07-14 MED ORDER — METFORMIN HCL ER 500 MG PO TB24: 1000.0000 mg | ORAL_TABLET | Freq: Every day | ORAL | Status: DC

## 2013-07-14 NOTE — Assessment & Plan Note (Signed)
Borderline low reading today with early morning hypotensive symptoms. Weight loss is clearly having an effect. D/C norvasc, continue benicar as she derives survival benefit from ARB. May D/C HCTZ in future.

## 2013-07-14 NOTE — Assessment & Plan Note (Signed)
Last A1c 8.9. Despite lifestyle improvements, BG remains elevated at home. Reports adherence to medications. Increased metformin to 1g XR daily and continue glucotrol. I fear longstanding insulin hypersecretion has "burned out" the pancreas and she will require insulin therapy regardless of improved sensitivity. Will follow up with Dr. Raymondo Band to reassess for regimen augmentation.

## 2013-07-14 NOTE — Progress Notes (Signed)
Patient ID: Dana Coleman, female   DOB: August 17, 1941, 72 y.o.   MRN: [id number] Subjective:  HPI:   Dana Coleman is a 72 y.o. female with h/o T2DM and HTN here for follow up of these.   Blood sugars in the morning 140-160 with lowest 140 and highest 167. Rarely checks at night, usually <200. Denies hypoglycemic events and is able to verbalize appropriate hypoglycemia management plan. Recently switched to metformin 500mg  XR to reduce GI upset, and says this works much better. Denies numbness/tingling, changes in vision. Has follow up appt with Dr. Raymondo Band.   Does not check BP at home, but is low/normal today and reports lightheadedness in the morning. Recently decreased dose of HCTZ in combination pill (benicar) to lower pill burden. Denies HA, syncope, palpitations, CP. States she can walk 20 min without stopping. Sleeps on 1 pillow.   She has lost weight with a goal of 135 lbs. Is 140lbs today. She walks every day 15-20 minutes and eats a good diet consisting mostly of fruits and vegetables. Admits to frequently adding salt to food.   Review of Systems:  Per HPI. All other systems reviewed and are negative.    Objective:  Physical Exam: BP 96/56  Pulse 88  Temp(Src) 99.2 F (37.3 C) (Oral)  Ht 5' 1.5" (1.562 m)  Wt 140 lb (63.504 kg)  BMI 26.03 kg/m2  Gen: 72 y.o. female in NAD HEENT: MMM, EOMI, PERRL, upper dentures CV: RRR, no MRG Resp: Non-labored, CTAB, no wheezes noted Abd: Soft, NTND, BS present, no guarding or organomegaly MSK: No edema noted, full ROM Neuro: Alert and oriented, CN II-XII grossly intact, speech normal Skin: toes nails all have white discoloration without erythema or signs of inflammation    Assessment:     Dana Coleman is a 72 y.o. female with h/o T2DM and HTN here for follow up of these.     Plan:     See problem list for problem-specific plans.

## 2013-07-14 NOTE — Patient Instructions (Addendum)
Thank you for coming in today! Good job with your lifestyle modifications. You are doing very well and should reach your 135lbs. goal as you continue.  STOP taking NORVASC because your blood pressure is getting better with your weight loss.  START taking metformin 500mg  2 TABLETS once per day.   As you leave, make an appointment to follow up with me in 3 months.  - Bring all medications in a bag to your visits. - Bring a log of your blood sugar readings.  Take care and seek immediate care sooner than 3 months if you develop any concerns.  Please feel free to call with any questions or concerns at any time, at [phone]. - Dr. Jarvis Newcomer

## 2013-07-25 ENCOUNTER — Other Ambulatory Visit: Payer: Self-pay | Admitting: Family Medicine

## 2013-08-15 ENCOUNTER — Other Ambulatory Visit (HOSPITAL_BASED_OUTPATIENT_CLINIC_OR_DEPARTMENT_OTHER): Payer: PRIVATE HEALTH INSURANCE | Admitting: Lab

## 2013-08-15 DIAGNOSIS — C50911 Malignant neoplasm of unspecified site of right female breast: Secondary | ICD-10-CM

## 2013-08-15 DIAGNOSIS — Z853 Personal history of malignant neoplasm of breast: Secondary | ICD-10-CM

## 2013-08-15 DIAGNOSIS — C50919 Malignant neoplasm of unspecified site of unspecified female breast: Secondary | ICD-10-CM

## 2013-08-15 DIAGNOSIS — E559 Vitamin D deficiency, unspecified: Secondary | ICD-10-CM

## 2013-08-15 LAB — CBC WITH DIFFERENTIAL/PLATELET
BASO%: 0.4 % (ref 0.0–2.0)
Basophils Absolute: 0 10*3/uL (ref 0.0–0.1)
EOS%: 0.4 % (ref 0.0–7.0)
HCT: 35.2 % (ref 34.8–46.6)
HGB: 11.1 g/dL — ABNORMAL LOW (ref 11.6–15.9)
LYMPH%: 22.1 % (ref 14.0–49.7)
MCH: 28.4 pg (ref 25.1–34.0)
MCHC: 31.7 g/dL (ref 31.5–36.0)
MCV: 89.5 fL (ref 79.5–101.0)
MONO%: 7.5 % (ref 0.0–14.0)
NEUT%: 69.6 % (ref 38.4–76.8)
Platelets: 188 10*3/uL (ref 145–400)

## 2013-08-15 LAB — COMPREHENSIVE METABOLIC PANEL (CC13)
AST: 22 U/L (ref 5–34)
Albumin: 3.4 g/dL — ABNORMAL LOW (ref 3.5–5.0)
Alkaline Phosphatase: 50 U/L (ref 40–150)
Glucose: 123 mg/dl (ref 70–140)
Potassium: 3.9 mEq/L (ref 3.5–5.1)
Sodium: 142 mEq/L (ref 136–145)
Total Protein: 7.4 g/dL (ref 6.4–8.3)

## 2013-08-16 LAB — VITAMIN D 25 HYDROXY (VIT D DEFICIENCY, FRACTURES): Vit D, 25-Hydroxy: 50 ng/mL (ref 30–89)

## 2013-08-22 ENCOUNTER — Ambulatory Visit (HOSPITAL_BASED_OUTPATIENT_CLINIC_OR_DEPARTMENT_OTHER): Payer: PRIVATE HEALTH INSURANCE | Admitting: Oncology

## 2013-08-22 ENCOUNTER — Encounter (INDEPENDENT_AMBULATORY_CARE_PROVIDER_SITE_OTHER): Payer: Self-pay

## 2013-08-22 ENCOUNTER — Encounter: Payer: Self-pay | Admitting: *Deleted

## 2013-08-22 VITALS — BP 152/85 | HR 67 | Temp 98.6°F | Resp 20 | Ht 61.5 in | Wt 140.6 lb

## 2013-08-22 DIAGNOSIS — C50911 Malignant neoplasm of unspecified site of right female breast: Secondary | ICD-10-CM

## 2013-08-22 DIAGNOSIS — C50419 Malignant neoplasm of upper-outer quadrant of unspecified female breast: Secondary | ICD-10-CM

## 2013-08-22 DIAGNOSIS — Z170 Estrogen receptor positive status [ER+]: Secondary | ICD-10-CM

## 2013-08-22 NOTE — Progress Notes (Signed)
[facility]  Telephone:(336) [phone] Fax:(336) [phone]  OFFICE PROGRESS NOTE   ID: Dana Coleman   DOB: [birth year]  MR#: [id number]  JYN#:[id number]   PCP: Hazeline Junker, MD SU: Claud Kelp, MD   HISTORY OF PRESENT ILLNESS: From Dr. Pierce Crane new patient evaluation note dated 06/28/2008: "This is a 72 year old from [place] here today with her granddaughter, [name], to discuss evaluation and treatment of breast cancer This woman has been having a mammogram every year.  She had a mammogram on 05/02/08 that showed a possible mass in the right breast.  Additional views were recommended.  Digital mammogram and right breast ultrasound performed 05/09/08 showed a small solid nodule at 10 o'clock 8 cm from the nipple measuring 7 x 4 x 6 mm.  Biopsy was recommended.  This was performed the same day and was found to be an invasive ductal carcinoma with extracellular mucin.  This was found to be ER and PR positive at 95% and 56% respectively, proliferative index was 12%, and HER-2 was 0.  Bilateral MRI scans were performed 05/18/08, these showed a 9 x 6 x 5 mm carcinoma outer quadrant right breast.  No other abnormalities were seen.  Patient elected to undergo lumpectomy and sentinel lymph node evaluation on 06/12/08 and this showed a 0.8 cm and grade 1 of 3 invasive ductal carcinoma with mucinous features.  Sentinel lymph node was negative for tumor.  Margins were widely clear.  Lymphovascular invasion was not seen.  Patient has had an unremarkable postoperative course."  Her subsequent history is as detailed below   INTERVAL HISTORY:  Dana Coleman returns today for followup of her breast cancer. The interval history is unremarkable. She will be completing 5 years on letrozole next month. She is ready to "graduate" today.  REVIEW OF SYSTEMS: She is doing "okay". She does a bit of housework some days a week. She is by herself at home otherwise, likes to clean her own house and  watch TV. She has no pets. She tells me her diabetes is "a little bit off" because of the holiday candy. She feels forgetful sometimes she does have problems with osteoarthritis, keeps a dry cough this time of year, needs new glasses, has some hot flashes and vaginal dryness, but never developed the arthralgias or myalgias that one can develop from aromatase inhibitors. A detailed review of systems today was otherwise noncontributory  PAST MEDICAL HISTORY: Past Medical History  Diagnosis Date  . Diabetes mellitus without complication   . Hypertension   . Breast cancer 04/2008    Right breast  . Hyperlipidemia   . Heart attack 2004  . Cervical cancer     When the patient was in her 30s  . 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 Cataracts, bilateral   Include myocardial infarction in 2004 followed by Dr. Donnie Aho, history of adult onset diabetes and hypertension  PAST SURGICAL HISTORY: Past Surgical History  Procedure Laterality Date  . Cervix surgery    . Thyroid      for nodule hemithyroidectomy, benign  . Breast surgery  2009    right lumpectomy  . Thyroidectomy Right 03/2003  Includes carcinoma of the cervix, radiation given to the pelvis in her 30s.  There was a Hrthle cell adenoma removed in June 2004 for which she underwent a right thyroidectomy.  FAMILY HISTORY Family History  Problem Relation Age of Onset  . Heart disease Mother     age 68  . Heart disease Father   .  Cancer Father     unknown  . Heart disease Brother   . Hypertension Brother   . Diabetes Daughter   . Cirrhosis Brother   Parents deceased.  She has three sisters and one brother who are living.  One brother died of liver cirrhosis.  There is no other history of breast or ovarian cancer in the family.  GYNECOLOGIC HISTORY: G5, P5, menarche age 72, menopausal after radiation to the pelvis.   SOCIAL HISTORY: Ms. Pouliot is married but has been separated from her husband Thalya Fouche for over 30 years.  She states  that her estranged husband has dementia and lives in a nursing home in [place].  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 She has five adult children of which one lives in [place], one lives in [place], and 3 of her children live locally in [place], [place].  She has 10 grandchildren and 10 great grandchildren.  In her spare time she enjoys shopping and states she is very active in her church.    ADVANCED DIRECTIVES: Not in place  HEALTH MAINTENANCE: History  Substance Use Topics  . Smoking status: Never Smoker   . Smokeless tobacco: Never Used  . Alcohol Use: No    Colonoscopy: 06/26/2009 PAP: 04/21/2008 Bone density: The patient had a bone density scan on 05/10/2012 which showed a T score of -2.0  (osteopenia).  Lipid panel: 03/28/2013  No Known Allergies  Current Outpatient Prescriptions  Medication Sig Dispense Refill  . acetaminophen (TYLENOL) 500 MG tablet 2 tablets by mouth three times a day as needed for your hands       . alendronate (FOSAMAX) 70 MG tablet Take 1 tablet (70 mg total) by mouth every 7 (seven) days. Take with a full glass of water on an empty stomach.  12 tablet  4  . aspirin 81 MG chewable tablet Chew 1 tablet (81 mg total) by mouth daily.  90 tablet  3  . Blood Glucose Monitoring Suppl MISC One touch ultra glucose monitor. Check blood sugar twice a day.       . Canagliflozin (INVOKANA) 100 MG TABS Take 1 tablet (100 mg total) by mouth daily before breakfast.  30 tablet  11  . cholecalciferol 2000 UNITS tablet 1 tablet (2,000 Units total). 1 tablet by mouth daily after finishing 8 week repletion      . glipiZIDE (GLUCOTROL XL) 10 MG 24 hr tablet Take 10 mg by mouth daily.      Marland Kitchen glucose blood (ONE TOUCH ULTRA TEST) test strip Check blood sugar twice daily.       . Lancets MISC Use as directed.       Marland Kitchen letrozole (FEMARA) 2.5 MG tablet Take 1 tablet (2.5 mg total) by mouth daily.  90 tablet  3  . metFORMIN (GLUCOPHAGE  XR) 500 MG 24 hr tablet Take 2 tablets (1,000 mg total) by mouth daily with breakfast.  90 tablet  3  . nitroGLYCERIN (NITROLINGUAL) 0.4 MG/SPRAY spray As needed       . olmesartan-hydrochlorothiazide (BENICAR HCT) 40-12.5 MG per tablet Take 1 tablet by mouth daily.  90 tablet  3  . rosuvastatin (CRESTOR) 40 MG tablet TAKE 1 TABLET (40 MG TOTAL) BY MOUTH AT BEDTIME FOR CHOLESTEROL  90 tablet  3   No current facility-administered medications for this visit.    OBJECTIVE: Elderly African American female in no acute distress. Filed Vitals:   08/22/13 1428  BP: 152/85  Pulse: 67  Temp: 98.6 F (37 C)  Resp: 20     Body mass index is 26.14 kg/(m^2).      ECOG FS: 1 - Symptomatic but completely ambulatory  Sclerae unicteric, pupils equal round and reactive Oropharynx no thrush or other lesions No cervical or supraclavicular adenopathy Lungs no rales or rhonchi Heart regular rate and rhythm Abd soft, nontender, positive bowel sounds MSK no focal spinal tenderness, no peripheral edema Neuro: nonfocal, well oriented, pleasant affect Breasts: The right breast is status post lumpectomy and radiation. There is a little bit of scaliness over her the inferior aspect of the nipple. There is no evidence of disease recurrence The right axilla is benign. The left breast is unremarkable    LAB RESULTS: Lab Results  Component Value Date   WBC 7.9 08/15/2013   NEUTROABS 5.5 08/15/2013   HGB 11.1* 08/15/2013   HCT 35.2 08/15/2013   MCV 89.5 08/15/2013   PLT 188 08/15/2013      Chemistry      Component Value Date/Time   NA 142 08/15/2013 1409   NA 141 09/07/2012 1149   K 3.9 08/15/2013 1409   K 4.3 09/07/2012 1149   CL 105 04/04/2013 1611   CL 102 09/07/2012 1149   CO2 24 08/15/2013 1409   CO2 29 09/07/2012 1149   BUN 12.2 08/15/2013 1409   BUN 13 09/07/2012 1149   CREATININE 0.9 08/15/2013 1409   CREATININE 0.89 09/07/2012 1149   CREATININE 0.93 03/29/2012 1351      Component Value Date/Time    CALCIUM 9.8 08/15/2013 1409   CALCIUM 9.7 09/07/2012 1149   ALKPHOS 50 08/15/2013 1409   ALKPHOS 69 09/07/2012 1149   AST 22 08/15/2013 1409   AST 19 09/07/2012 1149   ALT 11 08/15/2013 1409   ALT 12 09/07/2012 1149   BILITOT 0.23 08/15/2013 1409   BILITOT 0.3 09/07/2012 1149       Lab Results  Component Value Date   LABCA2 21 09/18/2011    Urinalysis No results found for this basename: colorurine,  appearanceur,  labspec,  phurine,  glucoseu,  hgbur,  bilirubinur,  ketonesur,  proteinur,  urobilinogen,  nitrite,  leukocytesur    STUDIES: Mammography 05/16/2013 showed no suspicious lesions   bone density scan on 05/10/2012 showed a T score of -2.0  (osteopenia).    ASSESSMENT: 72 y.o. [place] woman:  1.   Status post right  lumpectomy with right axillary sentinel node biopsy on 06/12/2008 for a pT1b pN0 stage IA invasive mucinous carcinoma, grade 1, estrogen receptor 95% positive, progesterone receptor 56% positive, Ki-67 12%, HER-2/neu negative,  4.  Status post adjuvant radiation therapy from 10/607/2009 through 08/29/2008.    5.  Started letrozole 09/2008.  The patient has also been taking Fosamax since 09/2008.   PLAN: Ms. Boissonneault has completed her 5 years of antiestrogen treatment and is now ready to "graduate". We do not have data to continue aromatase inhibitors beyond 5 years. She understands she will still need mammograms once a year, and she usually gets these at the breast Center in August. She will also need a yearly physician breast exam.  She has a little bit of scalyness over the right nipple. This does not look like Paget's disease. I suggested she rub a little while on it sometimes.  She understands that we'll be glad to see her at any point in the future as the need arises, but as of now no further routine appointments are being  made for her here.   08/22/2013, 2:51 PM  Lowella Dell, MD

## 2013-11-04 ENCOUNTER — Other Ambulatory Visit: Payer: Self-pay | Admitting: Family Medicine

## 2013-12-21 ENCOUNTER — Other Ambulatory Visit: Payer: Self-pay | Admitting: *Deleted

## 2013-12-21 NOTE — Telephone Encounter (Signed)
Refill request faxed back as denied-no longer being followed at [facility]. Noted to have PCP, DR. Vance Gather refill. She has appointment to see him on 12/29/13.

## 2013-12-29 ENCOUNTER — Ambulatory Visit (INDEPENDENT_AMBULATORY_CARE_PROVIDER_SITE_OTHER): Payer: Commercial Managed Care - HMO | Admitting: Family Medicine

## 2013-12-29 DIAGNOSIS — E119 Type 2 diabetes mellitus without complications: Secondary | ICD-10-CM

## 2013-12-29 LAB — POCT GLYCOSYLATED HEMOGLOBIN (HGB A1C): HEMOGLOBIN A1C: 8.3

## 2013-12-29 MED ORDER — METFORMIN HCL ER 500 MG PO TB24: 1000.0000 mg | ORAL_TABLET | Freq: Every day | ORAL | Status: DC

## 2013-12-29 NOTE — Patient Instructions (Signed)
Good job on [employer] plan, I'm glad you continue to lose weight little by little. Your A1c has improved but is still not at goal, so take 2 metformin pills (for a total of 1000mg ) once a day at breakfast and keep checking your sugars. Return for follow up with me in 3 months.   You will need a colonoscopy in September of this year.

## 2013-12-29 NOTE — Progress Notes (Signed)
Patient ID: Dana Coleman, female   DOB: [birth year], 73 y.o.   MRN: [id number]   Subjective:  HPI:   Dana Coleman is a 73 y.o. female with h/o T2DM and HTN here for T2DM follow up.    She reports diverting from her normally good diet recently and brings a log of blood sugars ranging 140-160 in AM and usually 160s in the PM with one isolated 317. Denies hypoglycemic events and is able to verbalize appropriate hypoglycemia management plan. Taking all medications as directed. Denies numbness/tingling, changes in vision. Due for retinal scan.   Does not check BP at home, but is low/normal today. Denies HA, syncope, palpitations, CP. Her conditioning is improving and she consistently walks 20-30 minutes per day in nicer weather.   She has remained about the same weight lately and says she has been putting sugar on her cereal in the morning, but has not had any other consistent indiscretions.   Review of Systems:  Per HPI. All other systems reviewed and are negative.    Past Medical History: Patient Active Problem List   Diagnosis Date Noted  . Cancer of right breast 08/22/2013  . Onychomycosis 07/30/2012  . Dyspnea 07/01/2011  . Chest pain 06/30/2011  . History of thyroid nodule 01/16/2011  . TRIGGER FINGER 02/14/2010  . CARCINOMA, BREAST, STAGE I, ESTROGEN RECEPTOR POSITIVE 08/24/2008  . HIP PAIN, RIGHT, CHRONIC 04/18/2008  . POSTMENOPAUSAL WITHOUT HORMONE REPLACEMENT THERAPY 04/18/2008  . SACROILIITIS, RIGHT 02/15/2008  . CERVICAL CANCER, HX OF 02/23/2007  . DIABETES MELLITUS II, UNCOMPLICATED 84/13/2440  . HYPERCHOLESTEROLEMIA 12/10/2006  . HYPERTENSION, BENIGN SYSTEMIC 12/10/2006  . CORONARY, ARTERIOSCLEROSIS 12/10/2006  . RHINITIS, ALLERGIC 12/10/2006  . DIVERTICULOSIS OF COLON 12/10/2006  . Urinary incontinence, mixed 12/10/2006  . OSTEOPENIA 12/10/2006    Medications: reviewed and updated Current Outpatient Prescriptions  Medication Sig Dispense Refill  .  acetaminophen (TYLENOL) 500 MG tablet 2 tablets by mouth three times a day as needed for your hands       . alendronate (FOSAMAX) 70 MG tablet Take 1 tablet (70 mg total) by mouth every 7 (seven) days. Take with a full glass of water on an empty stomach.  12 tablet  4  . aspirin 81 MG chewable tablet Chew 1 tablet (81 mg total) by mouth daily.  90 tablet  3  . Blood Glucose Monitoring Suppl MISC One touch ultra glucose monitor. Check blood sugar twice a day.       . Canagliflozin (INVOKANA) 100 MG TABS Take 1 tablet (100 mg total) by mouth daily before breakfast.  30 tablet  11  . cholecalciferol 2000 UNITS tablet 1 tablet (2,000 Units total). 1 tablet by mouth daily after finishing 8 week repletion      . glipiZIDE (GLUCOTROL XL) 10 MG 24 hr tablet Take 10 mg by mouth daily.      Marland Kitchen glucose blood (ONE TOUCH ULTRA TEST) test strip Check blood sugar twice daily.       . Lancets MISC Use as directed.       . metFORMIN (GLUCOPHAGE) 500 MG tablet TAKE 2 TABLETS (1,000 MG TOTAL) BY MOUTH 2 (TWO) TIMES DAILY WITH A MEAL.  180 tablet  3  . nitroGLYCERIN (NITROLINGUAL) 0.4 MG/SPRAY spray As needed       . olmesartan-hydrochlorothiazide (BENICAR HCT) 40-12.5 MG per tablet Take 1 tablet by mouth daily.  90 tablet  3  . rosuvastatin (CRESTOR) 40 MG tablet TAKE 1 TABLET (40 MG TOTAL) BY  MOUTH AT BEDTIME FOR CHOLESTEROL  90 tablet  3   No current facility-administered medications for this visit.    Objective:  Physical Exam: There were no vitals taken for this visit.  Gen: Pleasant 73 y.o. female in NAD HEENT: MMM, EOMI, PERRL, anicteric sclerae CV: RRR, no MRG, no JVD Resp: Non-labored, CTAB, no wheezes noted Abd: Soft, NTND, BS present, no guarding or organomegaly MSK: No edema noted, full ROM Neuro: Alert and oriented, speech normal    Assessment:     Dana Coleman is a 73 y.o. female here for T2DM follow up.     Plan:     See problem list for problem-specific plans.

## 2014-01-03 ENCOUNTER — Encounter: Payer: Self-pay | Admitting: Home Health Services

## 2014-01-03 ENCOUNTER — Ambulatory Visit (INDEPENDENT_AMBULATORY_CARE_PROVIDER_SITE_OTHER): Payer: Medicare HMO | Admitting: Home Health Services

## 2014-01-03 VITALS — BP 184/90 | HR 63 | Temp 98.2°F | Ht 61.5 in | Wt 139.2 lb

## 2014-01-03 DIAGNOSIS — Z0000 Encounter for general adult medical examination without abnormal findings: Secondary | ICD-10-CM

## 2014-01-03 NOTE — Assessment & Plan Note (Addendum)
A1c improved to 8.3, will augment current regimen with metformin 1000mg  BID and recheck in 3 months. Continued lifestyle modifications discussed. Refer for retinal scan.

## 2014-01-03 NOTE — Progress Notes (Signed)
Patient here for annual wellness visit, patient reports: Risk Factors/Conditions needing evaluation or treatment: Pt's bp is elevated today.  Pt reports feeling okay and that she did not take her BP medications this morning.  Retook BP manually and it was still elevated.  Consulted with PCP, Dr Bonner Puna.   He recommended pt going home and taking bp medications.  To schedule bp 1-2 times throughout the week and follow up with nurse visit in 1 week.  Appt was scheduled for 01/10/14 for nurse follow up.  Home Safety: Pt lives by self in 1 story home.  Pt reports having smoke detectors and does not have adaptive equipment in bathroom. Other Information: Corrective lens: Pt wears corrective lens for reading.  Pt has annual eye exams.  Dentures: Pt has full dentures. Memory: Pt reports some memory problems. Patient's Mini Mental Score (recorded in doc. flowsheet): 29 BMI/Exercise:  We discussed BMI and strategies for maintenance including eating regularly and continuing with walking 2-3 times a week. Med Adherence:  We discussed importance of taking medications daily for dm, htn, and cholesterol.  Pt reports 0 missed days in past week.  Pt sometimes takes medications later in the day if she forgets in the morning. Bladder:  Pt reports no problems with bladder control. ADL/IADL: Pt reports independence in all functions. Balance/Gait: Pt reports no falls in the past year.  We discussed strategies for home safety and fall prevention.   Balance Abnormal Patient value  Sitting balance    Sit to stand    Attempts to arise    Immediate standing balance    Standing balance    Nudge    Eyes closed- Romberg    Tandem stance x unable  Back lean    Neck Rotation    360 degree turn    Sitting down     Gait Abnormal Patient value  Initiation of gait    Step length-left x Reports pain in hip from walking  Step length-right    Step height-left x Reports pain in hip from walking  Step height-right    Step  symmetry x   Step continuity    Path deviation    Trunk movement    Walking stance        Annual Wellness Visit Requirements Recorded Today In  Medical, family, social history Past Medical, Family, Social History Section  Current providers Care team  Current medications Medications  Wt, BP, Ht, BMI Vital signs  Hearing assessment (welcome visit) Hearing/vision  Tobacco, alcohol, illicit drug use History  ADL Nurse Assessment  Depression Screening Nurse Assessment  Cognitive impairment Nurse Assessment  Mini Mental Status Document Flowsheet  Fall Risk Fall/Depression  Home Safety Progress Note  End of Life Planning (welcome visit) Social Documentation  Medicare preventative services Progress Note  Risk factors/conditions needing evaluation/treatment Progress Note  Personalized health advice Patient Instructions, goals, letter  Diet & Exercise Social Documentation  Emergency Contact Social Documentation  Seat Belts Social Documentation  Sun exposure/protection Social Documentation

## 2014-01-10 ENCOUNTER — Ambulatory Visit (INDEPENDENT_AMBULATORY_CARE_PROVIDER_SITE_OTHER): Payer: PRIVATE HEALTH INSURANCE | Admitting: *Deleted

## 2014-01-10 VITALS — BP 142/88

## 2014-01-10 DIAGNOSIS — Z136 Encounter for screening for cardiovascular disorders: Secondary | ICD-10-CM

## 2014-01-10 DIAGNOSIS — Z0130 Encounter for examination of blood pressure without abnormal findings: Secondary | ICD-10-CM

## 2014-01-10 NOTE — Progress Notes (Signed)
   Pt in clinic today for blood pressure check.  BP today 142/88 manually.  Pt brought in blood pressure reading from home starting 01/04/14 99/80, 01/05/14 142/80, 01/06/14 131/79, 01/07/14 141/85, 01/08/2014 138/85, and 01/09/14 145/82.  All home reading were take with electronic blood pressure machine.  Pt denies any other symptoms today.  Derl Barrow, RN

## 2014-01-11 ENCOUNTER — Encounter: Payer: Self-pay | Admitting: Home Health Services

## 2014-01-11 NOTE — Progress Notes (Signed)
I have reviewed this visit and discussed with Lamont Dowdy and agree with her documentation. Nalea Salce B. Bonner Puna, MD, PGY-1 01/11/2014 7:10 AM

## 2014-02-22 ENCOUNTER — Other Ambulatory Visit: Payer: Self-pay | Admitting: *Deleted

## 2014-02-22 NOTE — Telephone Encounter (Signed)
Request for 90 day supply. Tamika L Martin, RN  

## 2014-02-23 MED ORDER — GLIPIZIDE ER 10 MG PO TB24
10.0000 mg | ORAL_TABLET | Freq: Every day | ORAL | Status: DC
Start: ? — End: 2014-09-28

## 2014-04-04 ENCOUNTER — Encounter: Payer: Self-pay | Admitting: Cardiology

## 2014-04-04 DIAGNOSIS — I2510 Atherosclerotic heart disease of native coronary artery without angina pectoris: Secondary | ICD-10-CM

## 2014-04-04 DIAGNOSIS — E7800 Pure hypercholesterolemia, unspecified: Secondary | ICD-10-CM

## 2014-04-04 NOTE — Progress Notes (Unsigned)
Patient ID: Dana Coleman, female   DOB: 06/02/1941, 73 y.o.   MRN: [id number]   Adana, Marik  Date of visit:  04/04/2014 DOB:  06-05-1941    Age:  73 yrs. Medical record number:  [id number]     Account number:  [id number] Primary Care Provider: [facility] ____________________________ CURRENT DIAGNOSES  1. CAD,Native  2. Hypertension-Essential (Benign)  3. Hyperlipidemia, unspecified  4. MI-S/P Inferior  5. Diabetes Mellitus-NIDD/circ dis/controlled  6. Personal History Of Malignant Neoplasm Of Breast ____________________________ ALLERGIES  No Known Drug Allergies ____________________________ MEDICATIONS  1. Femara 2.5 mg Tablet, 1 p.o. daily  2. Crestor 40 mg Tablet, 1 p.o. daily  3. Benicar 40 mg Tablet, 1 p.o. daily  4. Tylenol Ex Str Arthritis Pain 500 mg Tablet, PRN  5. aspirin 81 mg Tablet, Chewable, 1 p.o. q.d.  6. hydrochlorothiazide 25 mg Tablet, 1 p.o. q.d.  7. nitroglycerin 0.4 mg tablet, sublingual, PRN  8. alendronate 70 mg tablet, weekly  9. amlodipine 10 mg tablet, 1 p.o. daily  10. cholecalciferol (vitamin D3) 2,000 unit capsule, 1 p.o. daily  11. metformin 500 mg tablet, 2 tabs bid  12. glipizide ER 10 mg tablet, extended release 24 hr, 1 p.o. daily  13. olmesartan 40 mg-hydrochlorothiazide 12.5 mg tablet, 1 p.o. daily ____________________________ CHIEF COMPLAINTS  Followup of CAD,Native  Followup of Hypertension-Essential (Benign) ____________________________ HISTORY OF PRESENT ILLNESS  Patient seen for cardiac followup. She has had a reasonably good year but described some right-sided chest discomfort that was present the other night lasting around 10 minutes. It was described as a heaviness and went away after about 10 minutes. She does not have exertional angina but complains of shortness of breath and mild fatigue. She is still able to do most of her housework. She has no PND, orthopnea or claudication. She did not bring her medications  and with her but brought a list but describes taking them differently than the list that she brought with her. ____________________________ PAST HISTORY  Past Medical Illnesses:  hypertension, DM-non-insulin dependent, hyperlipidemia, history of thyroid nodule, breast cancer 2009 treated with lumpectomy and radiation, history of cervical cancer treated with conization;  Cardiovascular Illnesses:  CAD, S/P MI-inferior February 2004;  Surgical Procedures:  partial thyroidectomy, breast lumpectomy;  Cardiology Procedures-Invasive:  stent RCA February 2004, cardiac cath (left);  Cardiology Procedures-Noninvasive:  adenosine cardiolite December 2008;  Cardiac Cath Results:  normal Left main, 30% stenosis proximal LAD, 80% stenosis distal LAD, 40% stenosis proximal OM 1, stent placed RCA;  LVEF of 62% documented via nuclear study on 09/29/2007,   ____________________________ CARDIO-PULMONARY TEST DATES EKG Date:  03/29/2013;   Cardiac Cath Date:  11/24/2002;  Stent Placement Date: 11/24/2002;  Nuclear Study Date:  09/29/2007;   ____________________________ FAMILY HISTORY Brother -- Brother alive and well Brother -- Brother dead Father -- Father dead, Cancer Mother -- Mother dead, Heart disease Sister -- Sister alive and well Sister -- Sister alive and well Sister -- Sister alive and well ____________________________ SOCIAL HISTORY Alcohol Use:  drinks occasionally;  Smoking:  never smoked;  Diet:  diabetic diet;  Lifestyle:  separated and 4 daughters;  Exercise:  exercises daily and walking;  Occupation:  cleans houses;  Residence:  lives alone;   ____________________________ REVIEW OF SYSTEMS General:  obesity Eyes: cataracts Respiratory: mild dyspnea with exertion Cardiovascular:  please review HPI Abdominal: denies dyspepsia, GI bleeding, constipation, or diarrheaGenitourinary-Female: no dysuria, urgency, frequency, UTIs, or stress incontinence Musculoskeletal:  arthritis of the fingers  Neurological:  denies headaches, stroke, or TIA  ____________________________ PHYSICAL EXAMINATION VITAL SIGNS  Blood Pressure:  180/94 Sitting, Right arm, regular cuff  , 186/90 Standing, Right arm and regular cuff   Pulse:  68/min. Weight:  140.00 lbs. Height:  62"BMI: 25  Constitutional:  pleasant, mildly obese, African American, female, in no acute distress Skin:  hyperpigmentation of abdomen Head:  normocephalic, normal hair pattern, no masses or tenderness Neck:  supple, no masses, thyromegaly, JVD. Carotid pulses are full and equal bilaterally without bruits. Chest:  normal symmetry, clear to auscultation and percussion. Cardiac:  regular rhythm, normal S1 and S2, No S3 or S4, no murmurs, gallops or rubs detected. Peripheral Pulses:  the femoral,dorsalis pedis, and posterior tibial pulses are full and equal bilaterally with no bruits auscultated. Extremities & Back:  no deformities, clubbing, cyanosis, erythema or edema observed. Normal muscle strength and tone. Neurological:  no gross motor or sensory deficits noted, affect appropriate, oriented x3. ____________________________ MOST RECENT LIPID PANEL 03/30/12  CHOL TOTL 153 mg/dl, LDL 61 calc, HDL 62 mg/dl, TRIGLYCER 152 mg/dl and CHOL/HDL 2.5 (Calc) ____________________________ IMPRESSIONS/PLAN  1. Chest discomfort in a patient with known coronary artery disease and previous infarction 2. Hypertension currently not well controlled with some confusion about medications. 3. Hyperlipidemia under treatment  Recommendations:  Bring medicines and on her next visit. Obtain Myoview study to evaluate the chest pain in the setting of known CAD and previous infarction. ____________________________ TODAYS ORDERS  1. Treadmill 1 day Cardiolite: First Available                       ____________________________ Cardiology Physician:  Kerry Hough MD [facility]

## 2014-04-11 ENCOUNTER — Other Ambulatory Visit: Payer: Self-pay | Admitting: Cardiology

## 2014-04-11 ENCOUNTER — Ambulatory Visit
Admission: RE | Admit: 2014-04-11 | Discharge: 2014-04-11 | Disposition: A | Discharge status: Home / Self Care | Payer: Medicaid Other | Source: Ambulatory Visit | Attending: Cardiology | Admitting: Cardiology

## 2014-04-11 DIAGNOSIS — I25119 Atherosclerotic heart disease of native coronary artery with unspecified angina pectoris: Secondary | ICD-10-CM

## 2014-04-11 NOTE — H&P (Signed)
Ma Rings, [place]  Date of visit:  04/11/2014 DOB:  [birth year]    Age:  73 yrs. Medical record number:  [id number]     Account number:  [id number] Primary Care Provider: [facility] ____________________________ CURRENT DIAGNOSES  1. CAD,Native  2. Pre-Op Cardiovascular Exam  3. Hypertension-Essential (Benign)  4. MI-S/P Inferior  5. Hyperlipidemia  6. Diabetes Mellitus-NIDD/circ dis/controlled  7. Personal History Of Malignant Neoplasm Of Breast ____________________________ ALLERGIES  No Known Drug Allergies ____________________________ MEDICATIONS  1. Femara 2.5 mg Tablet, 1 p.o. daily  2. Crestor 40 mg Tablet, 1 p.o. daily  3. Benicar 40 mg Tablet, 1 p.o. daily  4. Tylenol Ex Str Arthritis Pain 500 mg Tablet, PRN  5. aspirin 81 mg Tablet, Chewable, 1 p.o. q.d.  6. hydrochlorothiazide 25 mg Tablet, 1 p.o. q.d.  7. nitroglycerin 0.4 mg tablet, sublingual, PRN  8. alendronate 70 mg tablet, weekly  9. amlodipine 10 mg tablet, 1 p.o. daily  10. cholecalciferol (vitamin D3) 2,000 unit capsule, 1 p.o. daily  11. metformin 500 mg tablet, 2 tabs bid  12. glipizide ER 10 mg tablet, extended release 24 hr, 1 p.o. daily  13. olmesartan 40 mg-hydrochlorothiazide 12.5 mg tablet, 1 p.o. daily ____________________________ CHIEF COMPLAINTS Preop eval ____________________________ HISTORY OF PRESENT ILLNESS  Patient seen for preoperative evaluation prior to catheterization. The patient 73 years old and has a history of coronary artery disease with an inferior infarction in 2004. She had a stent to the right coronary artery at that time and also had distal disease in the LAD as well as lateral circumflex disease. She is diabetic and has recently had some chest discomfort as well as some shortness of breath. A recent myocardial perfusion scan showed very poor exercise tolerance and she had ST depression with Lexiscan as well as a right bundle branch block. She had evidence of an  inferior infarction with ischemia as well as lateral wall ischemia. Catheterization is planned at this time to assess extent and severity of coronary artery disease. ____________________________ PAST HISTORY  Past Medical Illnesses:  hypertension, DM-non-insulin dependent, hyperlipidemia, history of thyroid nodule, breast cancer 2009 treated with lumpectomy and radiation, history of cervical cancer treated with conization;  Cardiovascular Illnesses:  CAD, S/P MI-inferior February 2004;  Surgical Procedures:  partial thyroidectomy, breast lumpectomy;  Cardiology Procedures-Invasive:  stent RCA February 2004, cardiac cath (left);  Cardiology Procedures-Noninvasive:  adenosine cardiolite December 2008;  Cardiac Cath Results:  normal Left main, 30% stenosis proximal LAD, 80% stenosis distal LAD, 40% stenosis proximal OM 1, stent placed RCA;  LVEF of 62% documented via nuclear study on 09/29/2007,   ____________________________ CARDIO-PULMONARY TEST DATES EKG Date:  03/29/2013;   Cardiac Cath Date:  11/24/2002;  Stent Placement Date: 11/24/2002;  Nuclear Study Date:  04/06/2014;   ____________________________ FAMILY HISTORY Brother -- Brother alive and well Brother -- Brother dead Father -- Father dead, Cancer Mother -- Mother dead, Heart disease Sister -- Sister alive and well Sister -- Sister alive and well Sister -- Sister alive and well ____________________________ SOCIAL HISTORY Alcohol Use:  drinks occasionally;  Smoking:  never smoked;  Diet:  diabetic diet;  Lifestyle:  separated and 4 daughters;  Exercise:  exercises daily and walking;  Occupation:  cleans houses;  Residence:  lives alone;   ____________________________ REVIEW OF SYSTEMS General:  obesity  Integumentary:no rashes or new skin lesions. Eyes: cataracts Respiratory: mild dyspnea with exertion Cardiovascular:  please review HPI Abdominal: denies dyspepsia, GI bleeding, constipation, or diarrheaGenitourinary-Female:  no dysuria,  urgency, frequency, UTIs, or stress incontinence Musculoskeletal:  arthritis of the fingers Neurological:  denies headaches, stroke, or TIA  ____________________________ PHYSICAL EXAMINATION VITAL SIGNS  Blood Pressure:  110/70 Sitting, Left arm, regular cuff  , 116/72 Standing, Left arm and regular cuff   Pulse:  82/min. Weight:  139.00 lbs. Height:  62"BMI: 25  Constitutional:  pleasant, mildly obese, African American, female, in no acute distress Skin:  hyperpigmentation of abdomen Head:  normocephalic, normal hair pattern, no masses or tenderness Eyes:  EOMS Intact, PERRLA, C and S clear, Funduscopic exam not done. ENT:  ears, nose and throat reveal no gross abnormalities.  Dentition good. Neck:  supple, no masses, thyromegaly, JVD. Carotid pulses are full and equal bilaterally without bruits. Chest:  normal symmetry, clear to auscultation. Cardiac:  regular rhythm, normal S1 and S2, No S3 or S4, no murmurs, gallops or rubs detected. Abdomen:  abdomen soft,non-tender, no masses, no hepatospenomegaly, or aneurysm noted Peripheral Pulses:  the femoral,dorsalis pedis, and posterior tibial pulses are full and equal bilaterally with no bruits auscultated. Extremities & Back:  no deformities, clubbing, cyanosis, erythema or edema observed. Normal muscle strength and tone. Neurological:  no gross motor or sensory deficits noted, affect appropriate, oriented x3. ____________________________ MOST RECENT LIPID PANEL 04/11/13  CHOL TOTL 142 mg/dl, LDL 77 NM, HDL 41 mg/dl, TRIGLYCER 118 mg/dl and CHOL/HDL 3.5 (Calc) ____________________________ IMPRESSIONS/PLAN  1. Coronary artery disease with previous inferior infarction and strongly positive myocardial perfusion scan 2. Diabetes mellitus 3. Hypertension 4. Hyperlipidemia under treatment  Recommendations:  Cardiac catheterization was discussed with the patient including risks of myocardial infarction, death, stroke, bleeding, arrhythmia,  dye allergy, or renal insufficiency. He understands and is willing to proceed. Plan to proceed in 10 days. ____________________________ TODAYS ORDERS  1. Comprehensive Metabolic Panel: Today  2. Complete Blood Count: Today  3. Draw PT/INR: Today  4. PTT: Today  5. Chest X-ray PA/Lat: today  6. Cardiac cath Thursday July 9                    ____________________________ Cardiology Physician:  Kerry Hough MD [facility]

## 2014-04-14 ENCOUNTER — Encounter (HOSPITAL_COMMUNITY): Payer: Self-pay | Admitting: Pharmacy Technician

## 2014-04-20 ENCOUNTER — Ambulatory Visit (HOSPITAL_COMMUNITY)
Admission: RE | Admit: 2014-04-20 | Discharge: 2014-04-20 | Disposition: A | Discharge status: Home / Self Care | Payer: Medicare Other | Source: Ambulatory Visit | Attending: Cardiology | Admitting: Cardiology

## 2014-04-20 ENCOUNTER — Encounter (HOSPITAL_COMMUNITY)
Admission: RE | Disposition: A | Discharge status: Home / Self Care | Payer: Medicaid Other | Source: Ambulatory Visit | Attending: Cardiology

## 2014-04-20 DIAGNOSIS — R0989 Other specified symptoms and signs involving the circulatory and respiratory systems: Secondary | ICD-10-CM | POA: Diagnosis present

## 2014-04-20 DIAGNOSIS — Z8541 Personal history of malignant neoplasm of cervix uteri: Secondary | ICD-10-CM | POA: Insufficient documentation

## 2014-04-20 DIAGNOSIS — Z853 Personal history of malignant neoplasm of breast: Secondary | ICD-10-CM | POA: Insufficient documentation

## 2014-04-20 DIAGNOSIS — I2510 Atherosclerotic heart disease of native coronary artery without angina pectoris: Secondary | ICD-10-CM | POA: Insufficient documentation

## 2014-04-20 DIAGNOSIS — Z9861 Coronary angioplasty status: Secondary | ICD-10-CM | POA: Insufficient documentation

## 2014-04-20 DIAGNOSIS — E785 Hyperlipidemia, unspecified: Secondary | ICD-10-CM | POA: Insufficient documentation

## 2014-04-20 DIAGNOSIS — I4510 Unspecified right bundle-branch block: Secondary | ICD-10-CM | POA: Insufficient documentation

## 2014-04-20 DIAGNOSIS — I2584 Coronary atherosclerosis due to calcified coronary lesion: Secondary | ICD-10-CM | POA: Diagnosis not present

## 2014-04-20 DIAGNOSIS — I10 Essential (primary) hypertension: Secondary | ICD-10-CM | POA: Insufficient documentation

## 2014-04-20 DIAGNOSIS — R0609 Other forms of dyspnea: Secondary | ICD-10-CM | POA: Diagnosis present

## 2014-04-20 DIAGNOSIS — Z7982 Long term (current) use of aspirin: Secondary | ICD-10-CM | POA: Insufficient documentation

## 2014-04-20 DIAGNOSIS — I252 Old myocardial infarction: Secondary | ICD-10-CM | POA: Diagnosis not present

## 2014-04-20 DIAGNOSIS — E119 Type 2 diabetes mellitus without complications: Secondary | ICD-10-CM | POA: Diagnosis not present

## 2014-04-20 HISTORY — PX: LEFT HEART CATHETERIZATION WITH CORONARY ANGIOGRAM: SHX5451

## 2014-04-20 LAB — GLUCOSE, CAPILLARY
GLUCOSE-CAPILLARY: 182 mg/dL — AB (ref 70–99)
Glucose-Capillary: 117 mg/dL — ABNORMAL HIGH (ref 70–99)

## 2014-04-20 SURGERY — LEFT HEART CATHETERIZATION WITH CORONARY ANGIOGRAM
Anesthesia: LOCAL

## 2014-04-20 MED ORDER — SODIUM CHLORIDE 0.9 % IJ SOLN: 3.0000 mL | INTRAMUSCULAR | Status: DC | PRN

## 2014-04-20 MED ORDER — LIDOCAINE HCL (PF) 1 % IJ SOLN
INTRAMUSCULAR | Status: AC
  Filled 2014-04-20: qty 30

## 2014-04-20 MED ORDER — SODIUM CHLORIDE 0.9 % IV SOLN
INTRAVENOUS | Status: DC
Start: 2014-04-21 — End: 2014-04-20
  Administered 2014-04-20: 1000 mL via INTRAVENOUS

## 2014-04-20 MED ORDER — ASPIRIN 81 MG PO CHEW
81.0000 mg | CHEWABLE_TABLET | ORAL | Status: AC
  Administered 2014-04-20: 81 mg via ORAL

## 2014-04-20 MED ORDER — AMLODIPINE BESYLATE 5 MG PO TABS: 5.0000 mg | ORAL_TABLET | Freq: Every day | ORAL | Status: DC

## 2014-04-20 MED ORDER — MIDAZOLAM HCL 2 MG/2ML IJ SOLN
INTRAMUSCULAR | Status: AC
Start: 2014-04-20 — End: 2014-04-20
  Filled 2014-04-20: qty 2

## 2014-04-20 MED ORDER — NITROGLYCERIN 0.2 MG/ML ON CALL CATH LAB
INTRAVENOUS | Status: AC
Start: 2014-04-20 — End: 2014-04-20
  Filled 2014-04-20: qty 1

## 2014-04-20 MED ORDER — HEPARIN (PORCINE) IN NACL 2-0.9 UNIT/ML-% IJ SOLN
INTRAMUSCULAR | Status: AC
  Filled 2014-04-20: qty 1000

## 2014-04-20 MED ORDER — ASPIRIN 81 MG PO CHEW
CHEWABLE_TABLET | ORAL | Status: AC
  Filled 2014-04-20: qty 1

## 2014-04-20 MED ORDER — SODIUM CHLORIDE 0.9 % IV SOLN: 1.0000 mL/kg/h | INTRAVENOUS | Status: DC

## 2014-04-20 MED ORDER — SODIUM CHLORIDE 0.9 % IJ SOLN: 3.0000 mL | Freq: Two times a day (BID) | INTRAMUSCULAR | Status: DC

## 2014-04-20 MED ORDER — SODIUM CHLORIDE 0.9 % IV SOLN: 250.0000 mL | INTRAVENOUS | Status: DC | PRN

## 2014-04-20 NOTE — Progress Notes (Signed)
Site area: right groin  Site Prior to Removal:  Level 0  Pressure Applied For 20  MINUTES    Manual:   Yes.    Patient Status During Pull:  stable  Post Pull Groin Site:  Level 0  Post Pull Instructions Given:  Yes.    Post Pull Pulses Present:  Yes.    Dressing Applied:  Yes.     Bedrest begins @0900   Commentspatient remained stable during sheath pull

## 2014-04-20 NOTE — Discharge Instructions (Addendum)
Do not take your metformin for 2 days after the catheterization. You may start taking it again on Saturday morning.   Heart Catheterization Home Care  A catheter was placed through the blood vessel in your groin, contrast was injected into the vessels, and pictures were taken.   You may feel some discomfort at the insertion site after the local anesthetic wears off. This discomfort should gradually improve over the next several days.  Only take over-the-counter or prescription medicines for pain, discomfort, or fever as directed by your caregiver.   Complications are very uncommon after this procedure.Call my office if you develop any of the following symptoms:   Worsening pain.   Bleeding.   Severe swelling at the puncture site.   Lightheadedness.   Dizziness or fainting.   Fever or chills.   If oozing, bleeding, or a lump appears at the puncture site, apply firm pressure directly to the site steadily for 15 minutes and go to the emergency department.   Keep the skin around the insertion site dry. You may take showers after 24 hours. If the area does get wet, dry the skin completely. Avoid baths until the skin puncture site heals, usually 5 to 7 days.   Rest  for the remainder of the day and avoid any heavy lifting (more than 10 pounds or 4.5 kg). Do not operate heavy machinery, drive, or make legal decisions for the first 24 hours after the procedure. Have a responsible person drive you home.   You may resume your usual diet after the procedure. Avoid alcoholic beverages for 24 hours after the procedure.   Angiogram, Care After Refer to this sheet in the next few weeks. These instructions provide you with information on caring for yourself after your procedure. Your health care provider may also give you more specific instructions. Your treatment has been planned according to current medical practices, but problems sometimes occur. Call your health care provider if you have any  problems or questions after your procedure.  WHAT TO EXPECT AFTER THE PROCEDURE After your procedure, it is typical to have the following sensations:  Minor discomfort or tenderness and a small bump at the catheter insertion site. The bump should usually decrease in size and tenderness within 1 to 2 weeks.  Any bruising will usually fade within 2 to 4 weeks. HOME CARE INSTRUCTIONS   You may need to keep taking blood thinners if they were prescribed for you. Only take over-the-counter or prescription medicines for pain, fever, or discomfort as directed by your health care provider.  Do not apply powder or lotion to the site.  Do not sit in a bathtub, swimming pool, or whirlpool for 5 to 7 days.  You may shower 24 hours after the procedure. Remove the bandage (dressing) and gently wash the site with plain soap and water. Gently pat the site dry.  Inspect the site at least twice daily.  Limit your activity for the first 24 hours. Do not bend, squat, or lift anything over 10 lb (9 kg) or as directed by your health care provider.  Do not drive home if you are discharged the day of the procedure. Have someone else drive you. Follow instructions about when you can drive or return to work. SEEK MEDICAL CARE IF:  You get lightheaded when standing up.  You have drainage (other than a small amount of blood on the dressing).  You have chills.  You have a fever.  You have redness, warmth, swelling, or  pain at the insertion site. SEEK IMMEDIATE MEDICAL CARE IF:   You develop chest pain or shortness of breath, feel faint, or pass out.  You have bleeding, swelling larger than a walnut, or drainage from the catheter insertion site.  You develop pain, discoloration, coldness, or severe bruising in the leg or arm that held the catheter.  You have heavy bleeding from the site. If this happens, hold pressure on the site. MAKE SURE YOU:  Understand these instructions.  Will watch your  condition.  Will get help right away if you are not doing well or get worse. Document Released: 04/17/2005 Document Revised: 10/04/2013 Document Reviewed: 02/21/2013 [facility] Patient Information 2015 [place], [place]. This information is not intended to replace advice given to you by your health care provider. Make sure you discuss any questions you have with your health care provider.

## 2014-04-20 NOTE — Progress Notes (Signed)
Right femoral arterial sheath pulled by Ulyses Southward

## 2014-04-20 NOTE — H&P (View-Only) (Signed)
Dana Coleman, [place]  Date of visit:  04/11/2014 DOB:  10-28-1940    Age:  73 yrs. Medical record number:  [id number]     Account number:  [id number] Primary Care Provider: [facility] ____________________________ CURRENT DIAGNOSES  1. CAD,Native  2. Pre-Op Cardiovascular Exam  3. Hypertension-Essential (Benign)  4. MI-S/P Inferior  5. Hyperlipidemia  6. Diabetes Mellitus-NIDD/circ dis/controlled  7. Personal History Of Malignant Neoplasm Of Breast ____________________________ ALLERGIES  No Known Drug Allergies ____________________________ MEDICATIONS  1. Femara 2.5 mg Tablet, 1 p.o. daily  2. Crestor 40 mg Tablet, 1 p.o. daily  3. Benicar 40 mg Tablet, 1 p.o. daily  4. Tylenol Ex Str Arthritis Pain 500 mg Tablet, PRN  5. aspirin 81 mg Tablet, Chewable, 1 p.o. q.d.  6. hydrochlorothiazide 25 mg Tablet, 1 p.o. q.d.  7. nitroglycerin 0.4 mg tablet, sublingual, PRN  8. alendronate 70 mg tablet, weekly  9. amlodipine 10 mg tablet, 1 p.o. daily  10. cholecalciferol (vitamin D3) 2,000 unit capsule, 1 p.o. daily  11. metformin 500 mg tablet, 2 tabs bid  12. glipizide ER 10 mg tablet, extended release 24 hr, 1 p.o. daily  13. olmesartan 40 mg-hydrochlorothiazide 12.5 mg tablet, 1 p.o. daily ____________________________ CHIEF COMPLAINTS Preop eval ____________________________ HISTORY OF PRESENT ILLNESS  Patient seen for preoperative evaluation prior to catheterization. The patient 73 years old and has a history of coronary artery disease with an inferior infarction in 2004. She had a stent to the right coronary artery at that time and also had distal disease in the LAD as well as lateral circumflex disease. She is diabetic and has recently had some chest discomfort as well as some shortness of breath. A recent myocardial perfusion scan showed very poor exercise tolerance and she had ST depression with Lexiscan as well as a right bundle branch block. She had evidence of an  inferior infarction with ischemia as well as lateral wall ischemia. Catheterization is planned at this time to assess extent and severity of coronary artery disease. ____________________________ PAST HISTORY  Past Medical Illnesses:  hypertension, DM-non-insulin dependent, hyperlipidemia, history of thyroid nodule, breast cancer 2009 treated with lumpectomy and radiation, history of cervical cancer treated with conization;  Cardiovascular Illnesses:  CAD, S/P MI-inferior February 2004;  Surgical Procedures:  partial thyroidectomy, breast lumpectomy;  Cardiology Procedures-Invasive:  stent RCA February 2004, cardiac cath (left);  Cardiology Procedures-Noninvasive:  adenosine cardiolite December 2008;  Cardiac Cath Results:  normal Left main, 30% stenosis proximal LAD, 80% stenosis distal LAD, 40% stenosis proximal OM 1, stent placed RCA;  LVEF of 62% documented via nuclear study on 09/29/2007,   ____________________________ CARDIO-PULMONARY TEST DATES EKG Date:  03/29/2013;   Cardiac Cath Date:  11/24/2002;  Stent Placement Date: 11/24/2002;  Nuclear Study Date:  04/06/2014;   ____________________________ FAMILY HISTORY Brother -- Brother alive and well Brother -- Brother dead Father -- Father dead, Cancer Mother -- Mother dead, Heart disease Sister -- Sister alive and well Sister -- Sister alive and well Sister -- Sister alive and well ____________________________ SOCIAL HISTORY Alcohol Use:  drinks occasionally;  Smoking:  never smoked;  Diet:  diabetic diet;  Lifestyle:  separated and 4 daughters;  Exercise:  exercises daily and walking;  Occupation:  cleans houses;  Residence:  lives alone;   ____________________________ REVIEW OF SYSTEMS General:  obesity  Integumentary:no rashes or new skin lesions. Eyes: cataracts Respiratory: mild dyspnea with exertion Cardiovascular:  please review HPI Abdominal: denies dyspepsia, GI bleeding, constipation, or diarrheaGenitourinary-Female:  no dysuria,  urgency, frequency, UTIs, or stress incontinence Musculoskeletal:  arthritis of the fingers Neurological:  denies headaches, stroke, or TIA  ____________________________ PHYSICAL EXAMINATION VITAL SIGNS  Blood Pressure:  110/70 Sitting, Left arm, regular cuff  , 116/72 Standing, Left arm and regular cuff   Pulse:  82/min. Weight:  139.00 lbs. Height:  62"BMI: 25  Constitutional:  pleasant, mildly obese, African American, female, in no acute distress Skin:  hyperpigmentation of abdomen Head:  normocephalic, normal hair pattern, no masses or tenderness Eyes:  EOMS Intact, PERRLA, C and S clear, Funduscopic exam not done. ENT:  ears, nose and throat reveal no gross abnormalities.  Dentition good. Neck:  supple, no masses, thyromegaly, JVD. Carotid pulses are full and equal bilaterally without bruits. Chest:  normal symmetry, clear to auscultation. Cardiac:  regular rhythm, normal S1 and S2, No S3 or S4, no murmurs, gallops or rubs detected. Abdomen:  abdomen soft,non-tender, no masses, no hepatospenomegaly, or aneurysm noted Peripheral Pulses:  the femoral,dorsalis pedis, and posterior tibial pulses are full and equal bilaterally with no bruits auscultated. Extremities & Back:  no deformities, clubbing, cyanosis, erythema or edema observed. Normal muscle strength and tone. Neurological:  no gross motor or sensory deficits noted, affect appropriate, oriented x3. ____________________________ MOST RECENT LIPID PANEL 04/11/13  CHOL TOTL 142 mg/dl, LDL 77 NM, HDL 41 mg/dl, TRIGLYCER 118 mg/dl and CHOL/HDL 3.5 (Calc) ____________________________ IMPRESSIONS/PLAN  1. Coronary artery disease with previous inferior infarction and strongly positive myocardial perfusion scan 2. Diabetes mellitus 3. Hypertension 4. Hyperlipidemia under treatment  Recommendations:  Cardiac catheterization was discussed with the patient including risks of myocardial infarction, death, stroke, bleeding, arrhythmia,  dye allergy, or renal insufficiency. He understands and is willing to proceed. Plan to proceed in 10 days. ____________________________ TODAYS ORDERS  1. Comprehensive Metabolic Panel: Today  2. Complete Blood Count: Today  3. Draw PT/INR: Today  4. PTT: Today  5. Chest X-ray PA/Lat: today  6. Cardiac cath Thursday July 9                    ____________________________ Cardiology Physician:  Kerry Hough MD [facility]

## 2014-04-20 NOTE — CV Procedure (Signed)
Cardiac Catheterization Report   Dana Coleman    73 y.o.  female  DOB: 1941-01-31  MRN: [id number]  04/20/2014    PROCEDURE:  Left heart catheterization with selective coronary angiography, left ventriculogram.  INDICATIONS:  Dyspnea with an abnormal myocardial perfusion scan in a patient with previous right coronary artery stent and previous inferior infarction.  The risks, benefits, and details of the procedure were explained to the patient.  The patient verbalized understanding and wanted to proceed.  Informed written consent was obtained.  PROCEDURE TECHNIQUE:  After Xylocaine anesthesia a 34F sheath was placed in the right femoral artery with a single anterior needle wall stick.   Left coronary angiography was done using a Judkins L4 guide catheter.  Right coronary angiography was done using a Judkins R4 guide catheter.  A 30 cc ventriculogram was performed in the 30 degree RAO projection.  Tolerated the procedure well.  Sheath removed in the holding area.   CONTRAST:  Total of 55 cc.  COMPLICATIONS:  None.    HEMODYNAMICS:  Aortic postcontrast 158/74, left ventricle postcontrast 158/7010  There was no gradient between the left ventricle and aorta.    ANGIOGRAPHIC DATA:    CORONARY ARTERIES:   Arise and distribute normally.  Right dominant. Coronary arteries are heavily calcified.  Left main coronary artery: Heavily calcified with mild diffuse narrowing.  Left anterior descending: Heavily calcified proximally, proximal eccentric 50% stenosis, diffuse 50-60% stenosis along the length and terminates in the distal anterior wall. Severe 95-99% irregular stenosis at the ostium of a medium-sized diagonal branch at the bifurcation.  Circumflex coronary artery: Calcified with scattered irregularities.  Right coronary artery: Heavily calcified. The proximal stent is patent with a diffuse 50% in-stent restenosis. There is diffuse 50-70% stenosis  along the length of the right coronary artery. The posterior descending artery supplies the apex. A large posterolateral branch is also noted.Marland Kitchen  LEFT VENTRICULOGRAM:  Performed in the 30 RAO projection.  The aortic and mitral valves are normal. The left ventricle is normal in size with normal wall motion. Estimated ejection fraction is 50%.   IMPRESSIONS:  1. Severe 2 vessel coronary artery disease with severe stenosis involving the bifurcation of a mid LAD at the site of a diagonal branch with predominance stenosis in the diagonal branch, diffuse disease in the LAD, moderately severe diffuse disease involving the right coronary artery 2. Lower limits of normal  left ventricle.Marland Kitchen  RECOMMENDATION:  Intensify medical therapy. I will review the films with interventional cardiologist to determine if a PCI would be of value or not.  Kerry Hough MD [facility]

## 2014-04-20 NOTE — Interval H&P Note (Signed)
History and Physical Interval Note:   Patient seen and examined.  No interval change in history and exam since last note.  Stable for procedure.  Kerry Hough. MD [facility]  04/20/2014       [place]

## 2014-04-24 ENCOUNTER — Other Ambulatory Visit: Payer: Self-pay | Admitting: Family Medicine

## 2014-04-24 DIAGNOSIS — Z853 Personal history of malignant neoplasm of breast: Secondary | ICD-10-CM

## 2014-04-25 ENCOUNTER — Encounter: Payer: Self-pay | Admitting: Gastroenterology

## 2014-05-18 ENCOUNTER — Other Ambulatory Visit: Payer: Self-pay | Admitting: Family Medicine

## 2014-05-18 ENCOUNTER — Other Ambulatory Visit: Payer: Self-pay

## 2014-05-18 DIAGNOSIS — Z853 Personal history of malignant neoplasm of breast: Secondary | ICD-10-CM

## 2014-05-22 ENCOUNTER — Ambulatory Visit
Admission: RE | Admit: 2014-05-22 | Discharge: 2014-05-22 | Disposition: A | Discharge status: Home / Self Care | Payer: Medicare Other | Source: Ambulatory Visit | Attending: Family Medicine | Admitting: Family Medicine

## 2014-05-22 DIAGNOSIS — Z853 Personal history of malignant neoplasm of breast: Secondary | ICD-10-CM

## 2014-06-21 ENCOUNTER — Other Ambulatory Visit: Payer: Self-pay | Admitting: Family Medicine

## 2014-07-25 ENCOUNTER — Telehealth: Payer: Self-pay | Admitting: *Deleted

## 2014-07-25 NOTE — Telephone Encounter (Signed)
Debbie, RN with [facility] called stating she was doing a home visit with pt today.  Pt is not taking her metformin as prescribed.  Pt has been taking it once day and not twice a day.  Her A1c today 11.1; she had 2+ sugar and 1+ protein in her urine.  Appt for follow up visit 08/14/2014 at 2:15 PM with PCP.  Will forward to PCP for further advise.  Derl Barrow, RN

## 2014-08-14 ENCOUNTER — Ambulatory Visit (INDEPENDENT_AMBULATORY_CARE_PROVIDER_SITE_OTHER): Payer: Medicare Other

## 2014-08-14 ENCOUNTER — Encounter: Payer: Self-pay | Admitting: Family Medicine

## 2014-08-14 ENCOUNTER — Ambulatory Visit (INDEPENDENT_AMBULATORY_CARE_PROVIDER_SITE_OTHER): Payer: Medicare Other | Admitting: Family Medicine

## 2014-08-14 VITALS — BP 111/68 | HR 77 | Temp 97.9°F | Ht 62.0 in | Wt 135.9 lb

## 2014-08-14 DIAGNOSIS — Z23 Encounter for immunization: Secondary | ICD-10-CM

## 2014-08-14 DIAGNOSIS — Z794 Long term (current) use of insulin: Secondary | ICD-10-CM

## 2014-08-14 DIAGNOSIS — E1169 Type 2 diabetes mellitus with other specified complication: Secondary | ICD-10-CM | POA: Insufficient documentation

## 2014-08-14 DIAGNOSIS — E1165 Type 2 diabetes mellitus with hyperglycemia: Secondary | ICD-10-CM

## 2014-08-14 DIAGNOSIS — D126 Benign neoplasm of colon, unspecified: Secondary | ICD-10-CM

## 2014-08-14 DIAGNOSIS — IMO0002 Reserved for concepts with insufficient information to code with codable children: Secondary | ICD-10-CM | POA: Insufficient documentation

## 2014-08-14 LAB — POCT GLYCOSYLATED HEMOGLOBIN (HGB A1C): Hemoglobin A1C: 12.1

## 2014-08-14 MED ORDER — LANCETS MISC: 1.0000 | Status: DC

## 2014-08-14 MED ORDER — GLUCOSE BLOOD VI STRP: 1.0000 | ORAL_STRIP | Status: DC

## 2014-08-14 MED ORDER — INSULIN GLARGINE 100 UNIT/ML SOLOSTAR PEN: 10.0000 [IU] | PEN_INJECTOR | Freq: Every morning | SUBCUTANEOUS | Status: DC

## 2014-08-14 NOTE — Assessment & Plan Note (Addendum)
Uncontrolled on glipizide and metformin. Also quite intolerant of metformin (nausea, vomiting) but has continued to take this - was not having problems with one pill per day so will continue at that dose. - Start lantus solostar pen 10 units qAM (a little less than 0.2u/kg)  - Discontinue [facility] - To check blood sugars twice per day and record these: fasting in AM and prior to dinner - Reviewed low carbohydrate diet - Will follow up in pharmacy clinic for further diabetic teaching - Pt is on ASA, statin, ARB - Eye exam UTD - Foot exam normal today - Check Hb A1c

## 2014-08-14 NOTE — Patient Instructions (Signed)
STOP taking glipizide START taking metformin once in the morning  CHECK your blood sugars before breakfast and before supper START LANTUS INSULIN once a day take 10 units once daily in the morning   Schedule a follow up appointment with Dr. Valentina Lucks for diabetes.   I have sent a referral for your colonscopy.

## 2014-08-14 NOTE — Progress Notes (Signed)
   Subjective:  [name] is a 73 y.o. female with a history of CAD, HTN, HLD, T2DM here for diabetes follow up.   T2DM:  Checks blood sugar at home only very rarely.Thinks most readings are in the 100s, some in 200s. Takes medications about 7 days out of an average week. She was found to have protein in her urine by a home care nurse. Subsequent A1c was found to be above 11%. Reports a fair amount of apprehension about beginning insulin but also knew this may be required.  Last eye exam: March 2015 Flu and pneumovax: UTD  Review of Systems:  Denies fever, chills, weight loss, dizziness, vision changes, syncope, polyuria, nocturia, paresthesias, polydipsia, chest pain, new wounds.  All other systems reviewed and are negative.    Smoking status: None EtOH: None  Medications: reviewed and updated Objective:  BP 111/68 mmHg  Pulse 77  Temp(Src) 97.9 F (36.6 C) (Oral)  Ht 5\' 2"  (1.575 m)  Wt 135 lb 14.4 oz (61.644 kg)  BMI 24.85 kg/m2 Gen: Well-appearing 73 y.o.female in NAD HEENT: Normocephalic, sclerae/conjunctivae clear, PERRL, MMM, posterior oropharynx clear, good dentition Neck: neck supple, no masses appreciated; thyroid not enlarged  Pulm: Non-labored; CTAB, no wheezes  CV: Regular rate, no murmur appreciated; no LE edema, no JVD GI: NA BS; soft, non-tender, non-distended, no HSM Skin: No obvious acanthosis nigricans Foot: Inspection reveals no lesions/sores between toes. Some callus formation. No fissures, erythema, signs of infection or gross deformities. Posterior tibial and dorsalis pedis pulses are 2+, capillary refill is brisk. Sensation is intact to monofilament exam at the great toe, medial, central, lateral ball and posterior foot bilaterally.  Neuro: CN II-XII without deficits, sensation intact to light touch, steady gait.  Lab Results  Component Value Date   HGBA1C 12.1 08/14/2014   Assessment:  EVANI SHRIDER is a 73 y.o. female here for diabetes,  uncontrolled.   Plan:  See problem list for problem-specific plans.

## 2014-08-15 ENCOUNTER — Telehealth (HOSPITAL_BASED_OUTPATIENT_CLINIC_OR_DEPARTMENT_OTHER): Payer: Self-pay | Admitting: Emergency Medicine

## 2014-08-16 ENCOUNTER — Encounter: Payer: Self-pay | Admitting: Gastroenterology

## 2014-08-16 ENCOUNTER — Other Ambulatory Visit: Payer: Self-pay | Admitting: Family Medicine

## 2014-08-16 MED ORDER — INSULIN PEN NEEDLE 29G X 12.7MM MISC
1.0000 | Freq: Two times a day (BID) | Status: DC
Start: 2014-08-16 — End: 2014-12-01

## 2014-08-31 ENCOUNTER — Encounter: Payer: Self-pay | Admitting: Pharmacist

## 2014-08-31 ENCOUNTER — Ambulatory Visit (INDEPENDENT_AMBULATORY_CARE_PROVIDER_SITE_OTHER): Payer: Medicare Other | Admitting: Pharmacist

## 2014-08-31 VITALS — BP 120/72 | HR 75 | Ht 61.5 in | Wt 135.0 lb

## 2014-08-31 DIAGNOSIS — E119 Type 2 diabetes mellitus without complications: Secondary | ICD-10-CM

## 2014-08-31 NOTE — Assessment & Plan Note (Signed)
Patient with long-standing uncontrolled diabetes.  Last A1c not at goal. Improved blood glucose control and decreased nocturia post lantus initiation.  Denies hypoglycemic events and is able to verbalize appropriate hypoglycemia management plan.  Reports adherence with medications. Control is suboptimal due to poor compliance to diet, exercise, and suboptimal insulin dose. Increased dose of basal insulin Lantus (insulin glargine) to 16 units daily. Patient will continue to titrate 1 unit/day if fasting CBGs > 100mg /dl until fasting CBGs reach goal or next visit.  We discussed the signs and symptoms of hypoglycemia.  Instructed patient to finish the remainder of her glipizide, as she will likely no longer need this anymore.  Continue metformin 500 mg daily (cannot tolerate 1000 mg due to GI side effects) and consider changing to metformin XL.

## 2014-08-31 NOTE — Patient Instructions (Addendum)
Thank you for visiting [place]!  We will change your Lantus to 16 units daily in the morning.  Continue to increase your Lantus by 1 unit per day until your blood sugars are in the 100s.  Please continue the remainder of your glipizide until you run out.  No changes to your metformin will made, please continue with this medication.     Thank you for checking and recording your blood sugars.  We ask that you occasionally check your sugars 2 hours after meals.    Please follow up with [place] in December.

## 2014-08-31 NOTE — Progress Notes (Signed)
S:    Patient arrives in good spirits for a diabetes follow up appointment.  She has been on oral medications for her diabetes for several years but started on Lantus 10 units on 08/14/2014.  She reports polyuria resulting in several awakenings throughout the night and occasional dizziness.  Negative for polydipsia or polyphagia.  Appears compliant with medications, however, has not made any adjustments to her diet or exercise.    O:  . Lab Results  Component Value Date   HGBA1C 12.1 08/14/2014    Home fasting CBG readings of 180-220 with average of ~ 190.     A/P: Patient with long-standing uncontrolled diabetes.  Last A1c not at goal. Improved blood glucose control and decreased nocturia post lantus initiation.  Denies hypoglycemic events and is able to verbalize appropriate hypoglycemia management plan.  Reports adherence with medications. Control is suboptimal due to poor compliance to diet, exercise, and suboptimal insulin dose. Increased dose of basal insulin Lantus (insulin glargine) to 16 units daily. Patient will continue to titrate 1 unit/day if fasting CBGs > 100mg /dl until fasting CBGs reach goal or next visit.  We discussed the signs and symptoms of hypoglycemia.  Instructed patient to finish the remainder of her glipizide, as she will likely no longer need this anymore.  Continue metformin 500 mg daily (cannot tolerate 1000 mg due to GI side effects) and consider changing to metformin XL.    Patient was interested in walking for exercise but fear of walking alone.  I encouraged her to walk with a friend or join fitness facility if feasible.    Written patient instructions provided.  Follow up in Pharmacist Clinic Visit in December.   Total time in face to face counseling 20 minutes.  Patient seen with Hassie Bruce, Pharm D.

## 2014-09-01 NOTE — Progress Notes (Signed)
Patient ID: Dana Coleman, female   DOB: 03/03/1941, 73 y.o.   MRN: [id number] Reviewed: Agree with Dr. Koval's documentation and management. 

## 2014-09-21 ENCOUNTER — Encounter (HOSPITAL_COMMUNITY): Payer: Self-pay | Admitting: Cardiology

## 2014-09-28 ENCOUNTER — Encounter: Payer: Self-pay | Admitting: Pharmacist

## 2014-09-28 ENCOUNTER — Ambulatory Visit (INDEPENDENT_AMBULATORY_CARE_PROVIDER_SITE_OTHER): Payer: Medicare Other | Admitting: Pharmacist

## 2014-09-28 ENCOUNTER — Other Ambulatory Visit: Payer: Self-pay | Admitting: Family Medicine

## 2014-09-28 VITALS — BP 125/74 | HR 71 | Ht 62.0 in | Wt 133.4 lb

## 2014-09-28 DIAGNOSIS — E119 Type 2 diabetes mellitus without complications: Secondary | ICD-10-CM

## 2014-09-28 NOTE — Assessment & Plan Note (Signed)
Longstanding type 2 diabetes currently uncontrolled.   Denies hypoglycemic events and is able to verbalize appropriate hypoglycemia management plan.  Reports adherence with Lantus 25 units daily and metformin 500mg .  Control is suboptimal due to stress, dietary indiscretion, and diminished physical activity with the season. Lantus also not titrated until CBG < 100. Increased dose of basal insulin Lantus (insulin glargine) to 30 units daily. Will consider addition of rapid-acting insulin at next visit. Continue metformin 500 mg daily. Instructed patient to take blood glucose before breakfast and 2 hours after dinner. Provided dietary counseling, including avoidance of beverages with high sugar content.  Also educated patient on the progression of diabetes and the need for insulin once her pancreas stops producing insulin.

## 2014-09-28 NOTE — Progress Notes (Signed)
S:    Patient arrives to clinic in good spirits. Presents for diabetes follow up.   Reports that she has had diabetes for about 6 years.  Patient reports that she titrated her Lantus to 25 units a day and then stopped titrating. She does not provide a reason as to why she stopped titrating her insulin.   She reports that her blood glucose has been high and she is "disgusted" with how things are going. Reports checking blood glucose in the morning before breakfast and 2 hours after dinner. She has with her both her meter and a paper with the month of December's values written down.   She reports significant stress due to step-daughter and granddaughter's health issues.   Typical diet:  Breakfast: oatmeal with raisins (prepackaged), cereal with half a banana Lunch: sandwich, oodles of noodles Dinner: boiled chicken, greens (with oil). This is her largest meal of the day.  Snacks/sweets: reports that she cut back on sweets, such as cakes and cookies Drinks: sweet tea when out to eat, lemonade, sometimes diet coke  Patient denies any indication of infection.    O:  . Lab Results  Component Value Date   HGBA1C 12.1 08/14/2014    CBG with patient meter: 240 mg/dL  home fasting CBG readings of 179- 392 mg/dL (majority >210 mg/dL) home post-prandial CBG readings of 240-374 mg/dL (all 2 hours after dinner)   A/P: Longstanding type 2 diabetes currently uncontrolled.   Denies hypoglycemic events and is able to verbalize appropriate hypoglycemia management plan.  Reports adherence with Lantus 25 units daily and metformin 500mg .  Control is suboptimal due to stress, dietary indiscretion, and lack of physical activity. Lantus also not titrated until CBG < 100. Increased dose of basal insulin Lantus (insulin glargine) to 30 units daily. Will consider addition of rapid-acting insulin at next visit. Continue metformin 500 mg daily. Instructed patient to take blood glucose before breakfast and 2 hours  after dinner. Provided dietary counseling, including avoidance of beverages with high sugar content.  Also educated patient on the progression of diabetes and the need for insulin once her pancreas stops producing insulin.  Next A1C anticipated February 2016.  Written patient instructions provided.  Follow up in Pharmacist Clinic Visit in early January 2015.   Total time in face to face counseling 30 minutes.  Patient seen with Nicoletta Ba, PharmD Resident

## 2014-09-28 NOTE — Patient Instructions (Addendum)
It was nice to see you today [name]!  Try to avoid sweet tea and lemonade. You can drink diet soda. Try drinking more water.   Increase your Lantus to 30 units every day.  Come back to pharmacy clinic in the beginning of January.

## 2014-10-02 NOTE — Progress Notes (Signed)
Patient ID: Dana Coleman, female   DOB: Oct 29, 1940, 73 y.o.   MRN: [id number] Reviewed: Agree with Dr. Graylin Shiver documentation and management.

## 2014-10-17 ENCOUNTER — Encounter: Payer: Self-pay | Admitting: Pharmacist

## 2014-10-17 ENCOUNTER — Ambulatory Visit (AMBULATORY_SURGERY_CENTER): Payer: Self-pay

## 2014-10-17 ENCOUNTER — Ambulatory Visit (INDEPENDENT_AMBULATORY_CARE_PROVIDER_SITE_OTHER): Payer: Medicare Other | Admitting: Pharmacist

## 2014-10-17 VITALS — Ht 62.5 in | Wt 136.6 lb

## 2014-10-17 VITALS — BP 118/73 | HR 112 | Ht 62.5 in | Wt 136.5 lb

## 2014-10-17 DIAGNOSIS — E1165 Type 2 diabetes mellitus with hyperglycemia: Secondary | ICD-10-CM

## 2014-10-17 DIAGNOSIS — Z86010 Personal history of colonic polyps: Secondary | ICD-10-CM

## 2014-10-17 DIAGNOSIS — Z794 Long term (current) use of insulin: Secondary | ICD-10-CM

## 2014-10-17 DIAGNOSIS — IMO0002 Reserved for concepts with insufficient information to code with codable children: Secondary | ICD-10-CM

## 2014-10-17 MED ORDER — NA SULFATE-K SULFATE-MG SULF 17.5-3.13-1.6 GM/177ML PO SOLN: ORAL | Status: DC

## 2014-10-17 NOTE — Progress Notes (Signed)
Patient ID: Dana Coleman, female   DOB: 1941-06-16, 74 y.o.   MRN: [id number] Reviewed: Agree with Dr. Graylin Shiver documentation and management.

## 2014-10-17 NOTE — Assessment & Plan Note (Signed)
Diabetes diagnosed in 2008 currently improved with use of Lantus 30 units QAM.   Denies hypoglycemic events and is able to verbalize appropriate hypoglycemia management plan.  Reports adherence with medication.  Control is suboptimal due to somewhat erratic meal intake and long acting insulin monotherapy. Continued basal insulin Lantus (insulin glargine) QAM at this time.   Advised patient to:  check blood sugar in the morning most days per week and also check  blood sugar prior to dinner AND at bedtime 3 times per week.  Asked to bring measurements and your meter to next visit.  Next visit in Pharmacy clinic at the end of January. Next A1C anticipated in 2-3 months.  Written patient instructions provided.  Total time in face to face counseling 25 minutes.  Patient seen with Randell Patient, PharmD Candidate and Milus Glazier,  PharmD Resident.

## 2014-10-17 NOTE — Patient Instructions (Addendum)
Continue taking lantus 30 units each morning.  Please check your blood sugar in the morning most days per week (4-5 days per week) before you eat.  Please also check your blood sugar prior to dinner AND at bedtime 3 times per week.    Bring your measurements and your meter to your next visit.   Next visit in Pharmacy clinic at the end of January.   Please make visit on the way out.

## 2014-10-17 NOTE — Progress Notes (Signed)
S:    Patient arrives in good spirits stating she is still not well controlled.    Presents for diabetes follow up.  Patient reports having history of Diabetes since the year of 2008  Patient has taken lantus medication since November 2015.   O:  Lab Results  Component Value Date   HGBA1C 12.1 08/14/2014     home fasting CBG readings of 69-200 with majority of readings <150.    2 hour post-prandial/random CBG readings not performed at this time.    A/P: Diabetes diagnosed in 2008 currently improved with use of Lantus 30 units QAM.   Denies hypoglycemic events and is able to verbalize appropriate hypoglycemia management plan.  Reports adherence with medication.  Control is suboptimal due to somewhat erratic meal intake and long acting insulin monotherapy. Continued basal insulin Lantus (insulin glargine) QAM at this time.   Advised patient to:  check blood sugar in the morning most days per week and also check  blood sugar prior to dinner AND at bedtime 3 times per week.  Asked to bring measurements and your meter to next visit.  Next visit in Pharmacy clinic at the end of January. Next A1C anticipated in 2-3 months.  Written patient instructions provided.  Total time in face to face counseling 25 minutes.  Patient seen with Randell Patient, PharmD Candidate and Milus Glazier,  PharmD Resident.

## 2014-10-17 NOTE — Progress Notes (Signed)
Per pt, no allergies to soy or egg products.Pt not taking any weight loss meds or using  O2 at home. 

## 2014-10-31 ENCOUNTER — Ambulatory Visit (AMBULATORY_SURGERY_CENTER): Payer: Medicare Other | Admitting: Gastroenterology

## 2014-10-31 ENCOUNTER — Encounter: Payer: Self-pay | Admitting: Gastroenterology

## 2014-10-31 VITALS — BP 132/67 | HR 54 | Temp 97.2°F | Resp 16 | Ht 62.5 in | Wt 136.0 lb

## 2014-10-31 DIAGNOSIS — D123 Benign neoplasm of transverse colon: Secondary | ICD-10-CM

## 2014-10-31 DIAGNOSIS — D124 Benign neoplasm of descending colon: Secondary | ICD-10-CM

## 2014-10-31 DIAGNOSIS — K5730 Diverticulosis of large intestine without perforation or abscess without bleeding: Secondary | ICD-10-CM | POA: Diagnosis not present

## 2014-10-31 DIAGNOSIS — Z86010 Personal history of colonic polyps: Secondary | ICD-10-CM

## 2014-10-31 LAB — GLUCOSE, CAPILLARY
GLUCOSE-CAPILLARY: 147 mg/dL — AB (ref 70–99)
Glucose-Capillary: 168 mg/dL — ABNORMAL HIGH (ref 70–99)

## 2014-10-31 MED ORDER — SODIUM CHLORIDE 0.9 % IV SOLN: 500.0000 mL | INTRAVENOUS | Status: DC

## 2014-10-31 NOTE — Op Note (Signed)
[facility]  [address] [place], [id number]   COLONOSCOPY PROCEDURE REPORT  PATIENT: Dana, Coleman  MR#: [id number] BIRTHDATE: September 05, 1941 , 73  yrs. old GENDER: female ENDOSCOPIST: Inda Castle, MD REFERRED BY: PROCEDURE DATE:  10/31/2014 PROCEDURE:   Colonoscopy with snare polypectomy and Colonoscopy with cold biopsy polypectomy First Screening Colonoscopy - Avg.  risk and is 50 yrs.  old or older - No.  Prior Negative Screening - Now for repeat screening. N/A  History of Adenoma - Now for follow-up colonoscopy & has been > or = to 3 yrs.  Yes hx of adenoma.  Has been 3 or more years since last colonoscopy.  Polyps Removed Today? Yes. ASA CLASS:   Class II INDICATIONS:high risk personal history of colonic polyps.  2010 MEDICATIONS: Propofol 100 mg IV  DESCRIPTION OF PROCEDURE:   After the risks benefits and alternatives of the procedure were thoroughly explained, informed consent was obtained.  The digital rectal exam revealed no abnormalities of the rectum.   The LB [id number]  endoscope was introduced through the anus and advanced to the cecum, which was identified by both the appendix and ileocecal valve. No adverse events experienced.   The quality of the prep was excellent using Suprep  The instrument was then slowly withdrawn as the colon was fully examined.      COLON FINDINGS: A sessile polyp measuring 2 mm in size was found in the proximal transverse colon.  A polypectomy was performed with cold forceps.   Two sessile polyps measuring 4 mm in size were found in the descending colon.  A polypectomy was performed with a cold snare.  The resection was complete, the polyp tissue was completely retrieved and sent to histology.   There was mild diverticulosis noted in the ascending colon, at the cecum, in the transverse colon, sigmoid colon, and descending colon.  Retroflexed views revealed no abnormalities. The time to cecum=3 minutes  16 seconds.  Withdrawal time=12 minutes 18 seconds.  The scope was withdrawn and the procedure completed. COMPLICATIONS: There were no immediate complications.  ENDOSCOPIC IMPRESSION: 1.   Sessile polyp was found in the proximal transverse colon; polypectomy was performed with cold forceps 2.   Two sessile polyps were found in the descending colon; polypectomy was performed with a cold snare 3.   There was mild diverticulosis noted in the ascending colon, at the cecum, in the transverse colon, sigmoid colon, and descending colon  RECOMMENDATIONS: If the polyp(s) removed today are proven to be adenomatous (pre-cancerous) polyps, you will need a repeat colonoscopy in 5 years.  Otherwise you should continue to follow colorectal cancer screening guidelines for "routine risk" patients with colonoscopy in 10 years.  You will receive a letter within 1-2 weeks with the results of your biopsy as well as final recommendations.  Please call my office if you have not received a letter after 3 weeks.  eSigned:  Inda Castle, MD 10/31/2014 10:17 AM   cc: Vance Gather, MD   PATIENT NAME:  Dana, Coleman MR#: [id number]

## 2014-10-31 NOTE — Patient Instructions (Signed)
YOU HAD AN ENDOSCOPIC PROCEDURE TODAY AT THE [facility]: Refer to the procedure report that was given to you for any specific questions about what was found during the examination.  If the procedure report does not answer your questions, please call your gastroenterologist to clarify.  If you requested that your care partner not be given the details of your procedure findings, then the procedure report has been included in a sealed envelope for you to review at your convenience later.  YOU SHOULD EXPECT: Some feelings of bloating in the abdomen. Passage of more gas than usual.  Walking can help get rid of the air that was put into your GI tract during the procedure and reduce the bloating. If you had a lower endoscopy (such as a colonoscopy or flexible sigmoidoscopy) you may notice spotting of blood in your stool or on the toilet paper. If you underwent a bowel prep for your procedure, then you may not have a normal bowel movement for a few days.  DIET: Your first meal following the procedure should be a light meal and then it is ok to progress to your normal diet.  A half-sandwich or bowl of soup is an example of a good first meal.  Heavy or fried foods are harder to digest and may make you feel nauseous or bloated.  Likewise meals heavy in dairy and vegetables can cause extra gas to form and this can also increase the bloating.  Drink plenty of fluids but you should avoid alcoholic beverages for 24 hours.  ACTIVITY: Your care partner should take you home directly after the procedure.  You should plan to take it easy, moving slowly for the rest of the day.  You can resume normal activity the day after the procedure however you should NOT DRIVE or use heavy machinery for 24 hours (because of the sedation medicines used during the test).    SYMPTOMS TO REPORT IMMEDIATELY: A gastroenterologist can be reached at any hour.  During normal business hours, 8:30 AM to 5:00 PM Monday through Friday,  call [phone].  After hours and on weekends, please call the GI answering service at [phone] who will take a message and have the physician on call contact you.   Following lower endoscopy (colonoscopy or flexible sigmoidoscopy):  Excessive amounts of blood in the stool  Significant tenderness or worsening of abdominal pains  Swelling of the abdomen that is new, acute  Fever of 100F or higher    FOLLOW UP: If any biopsies were taken you will be contacted by phone or by letter within the next 1-3 weeks.  Call your gastroenterologist if you have not heard about the biopsies in 3 weeks.  Our staff will call the home number listed on your records the next business day following your procedure to check on you and address any questions or concerns that you may have at that time regarding the information given to you following your procedure. This is a courtesy call and so if there is no answer at the home number and we have not heard from you through the emergency physician on call, we will assume that you have returned to your regular daily activities without incident.  SIGNATURES/CONFIDENTIALITY: You and/or your care partner have signed paperwork which will be entered into your electronic medical record.  These signatures attest to the fact that that the information above on your After Visit Summary has been reviewed and is understood.  Full responsibility of the confidentiality   of this discharge information lies with you and/or your care-partner.     

## 2014-10-31 NOTE — Progress Notes (Signed)
Report to PACU, RN, vss, BBS= Clear.  

## 2014-11-01 ENCOUNTER — Telehealth: Payer: Self-pay | Admitting: *Deleted

## 2014-11-01 NOTE — Telephone Encounter (Signed)
  Follow up Call-  Call back number 10/31/2014  Post procedure Call Back phone  # [phone]  Permission to leave phone message Yes     Patient questions:  Do you have a fever, pain , or abdominal swelling? No. Pain Score  0 *  Have you tolerated food without any problems? Yes.    Have you been able to return to your normal activities? Yes.    Do you have any questions about your discharge instructions: Diet   No. Medications  No. Follow up visit  No.  Do you have questions or concerns about your Care? No.  Actions: * If pain score is 4 or above: No action needed, pain <4.

## 2014-11-07 ENCOUNTER — Ambulatory Visit (INDEPENDENT_AMBULATORY_CARE_PROVIDER_SITE_OTHER): Payer: Medicare Other | Admitting: Pharmacist

## 2014-11-07 ENCOUNTER — Encounter: Payer: Self-pay | Admitting: Pharmacist

## 2014-11-07 VITALS — BP 106/61 | HR 73 | Ht 62.0 in | Wt 135.0 lb

## 2014-11-07 DIAGNOSIS — Z794 Long term (current) use of insulin: Secondary | ICD-10-CM

## 2014-11-07 DIAGNOSIS — E1165 Type 2 diabetes mellitus with hyperglycemia: Secondary | ICD-10-CM

## 2014-11-07 DIAGNOSIS — IMO0002 Reserved for concepts with insufficient information to code with codable children: Secondary | ICD-10-CM

## 2014-11-07 DIAGNOSIS — Z23 Encounter for immunization: Secondary | ICD-10-CM

## 2014-11-07 MED ORDER — INSULIN ASPART 100 UNIT/ML FLEXPEN: 6.0000 [IU] | PEN_INJECTOR | Freq: Every day | SUBCUTANEOUS | Status: DC

## 2014-11-07 MED ORDER — METFORMIN HCL 500 MG PO TABS: 500.0000 mg | ORAL_TABLET | Freq: Two times a day (BID) | ORAL | Status: DC

## 2014-11-07 NOTE — Assessment & Plan Note (Signed)
Diabetes diagnosed in 2009 currently uncontrolled on current medications based on patient's blood glucose log. Denies hypoglycemic events and is able to verbalize appropriate hypoglycemia management plan.  Reports adherence with medication. Control is suboptimal due to inadequate dose of insulin and metformin.   Started rapid insulin Novolog (insulin aspart) at 6 U before supper (biggest meal). Increased metformin dose to 500 mg bid instead of the 500 daily.  Instructed patient to measure blood glucose before bedtime and if the reading was less than a 100 to eat a small snack. Next A1C anticipated in 2-3 months.  Written patient instructions provided.  Follow up in Pharmacist Clinic Visit in 2-3 weeks.   Total time in face to face counseling 30  minutes.  Patient seen with Kathrine Cords, MD, Randell Patient, PharmD Candidate and Milus Glazier, PharmD Resident.

## 2014-11-07 NOTE — Progress Notes (Signed)
Patient ID: Dana Coleman, female   DOB: [birth year], 74 y.o.   MRN: [id number] Reviewed: Agree with Dr. Graylin Shiver documentation and management.

## 2014-11-07 NOTE — Patient Instructions (Addendum)
It was very nice seeing you today!  Please continue to take 30 units of Lantus in the morning.  Please begin taking 6 units of Novolog before you eat supper everyday.   Please continue to check your sugars before dinner and before bedtime.   If your sugar is below 100 before bedtime, please drink a small amount of orange juice or eat a small snack.  Please take metformin 500 mg once in the morning with breakfast and then once in the evening with supper.  Please make an appointment to see [place] back in the pharmacy clinic in 2-3 weeks.

## 2014-11-07 NOTE — Progress Notes (Signed)
S:    Patient arrives in good spirits.    Presents for diabetes with a list of measured blood sugars before and after supper.  Patient reports having history of Diabetes since 2009.  Patient reports adherence with medications. Current diabetes medications include  Insulin glargine 30 U every am and metformin 500 mg daily.   She did reemphasize she has had nausea with metformin in the past with the 1000mg  dose.   Patient denies hypoglycemic events.  Patient reported dietary habits: likes bread, uses sweet and low for her coffee and tea, drinks sprite, green tea, and two cups of coffee daily,  Patient reported exercise habits: works daily and does some house work, did not report any walking or other forms of exercise  Patient reports nocturia, twice every night.  O:  . Lab Results  Component Value Date   HGBA1C 12.1 08/14/2014    Per patient blood glucose log: FBG: 230-258 (two readings) Post-prandial BG: 171-317  A/P: Diabetes diagnosed in 2009 currently uncontrolled on current medications based on patient's blood glucose log. Denies hypoglycemic events and is able to verbalize appropriate hypoglycemia management plan.  Reports adherence with medication. Control is suboptimal due to inadequate dose of insulin and metformin.   Started rapid insulin Novolog (insulin aspart) at 6 U before supper (biggest meal). Increased metformin dose to 500 mg bid instead of the 500 daily.  Instructed patient to measure blood glucose before bedtime and if the reading was less than a 100 to eat a small snack. Next A1C anticipated in 2-3 months.  Written patient instructions provided.  Follow up in Pharmacist Clinic Visit in 2-3 weeks.   Total time in face to face counseling 30  minutes.  Patient seen with Kathrine Cords, MD, Randell Patient, PharmD Candidate and Milus Glazier, PharmD Resident.

## 2014-11-08 ENCOUNTER — Encounter: Payer: Self-pay | Admitting: Gastroenterology

## 2014-11-30 ENCOUNTER — Ambulatory Visit: Payer: Medicare Other | Admitting: Pharmacist

## 2014-12-01 ENCOUNTER — Encounter: Payer: Self-pay | Admitting: Pharmacist

## 2014-12-01 ENCOUNTER — Ambulatory Visit (INDEPENDENT_AMBULATORY_CARE_PROVIDER_SITE_OTHER): Payer: Medicare Other | Admitting: Pharmacist

## 2014-12-01 VITALS — BP 123/79 | HR 90 | Ht 62.0 in | Wt 135.0 lb

## 2014-12-01 DIAGNOSIS — Z794 Long term (current) use of insulin: Secondary | ICD-10-CM

## 2014-12-01 DIAGNOSIS — E1165 Type 2 diabetes mellitus with hyperglycemia: Secondary | ICD-10-CM

## 2014-12-01 DIAGNOSIS — IMO0002 Reserved for concepts with insufficient information to code with codable children: Secondary | ICD-10-CM

## 2014-12-01 MED ORDER — METFORMIN HCL 500 MG PO TABS: 500.0000 mg | ORAL_TABLET | Freq: Three times a day (TID) | ORAL | Status: DC

## 2014-12-01 MED ORDER — INSULIN PEN NEEDLE 29G X 12.7MM MISC: 1.0000 | Freq: Two times a day (BID) | Status: DC

## 2014-12-01 MED ORDER — INSULIN ASPART 100 UNIT/ML FLEXPEN: 6.0000 [IU] | PEN_INJECTOR | Freq: Every day | SUBCUTANEOUS | Status: DC

## 2014-12-01 NOTE — Progress Notes (Signed)
S:    Patient arrives in good spirits, walking without assistance. Presents for diabetes follow up. States that her blood sugar numbers are "much better." Patient reports having history of diabetes since 2008 or 2009. Patient reports adherence with medications. She has been taking the Novolog prior to her evening meal. Current diabetes medications include metformin 500mg  BID, Lantus 30 units QAM and Novolog 6 units prior to dinner. Patient reports one episode of feeling "woozy" when she had a possible hypoglycemic events (CBG of 84) which she appropriately managed with a peppermint.   Patient reported dietary habits:  Including vegetables, limited carbohydrates and drinking MORE water. Does like fried fish and chicken. Drinks diet soda rarely.    Patient reported exercise habits:  Attempting to walk further and 3-4 days per week.    Patient continues to have some nocturia with some nights having only 1 time of urination.    O:  . Lab Results  Component Value Date   HGBA1C 12.1 08/14/2014     Home fasting CBG: Lowest of 84 in AM    Average fasting CBG: 130s Average pre dinner and 2 hour PPG: mid to upper 100s. Only a few readings in the low 200s  A/P: Diabetes diagnosed in 2008 with improved control after recent addition of dinner time Novolog injection. Patient reports adherence to metformin 500mg  BID, Lantus 30 units daily, and Novolog 6 units with dinner. She reports 1 hypoglycemic episode with CBG of 84 which she responded to appropriately by eating a peppermint. Patient recently started taking metoprolol this past month; counseled patient regarding masked hypoglycemia symptoms with beta blocker use. Most fasting CBGs around 130s with average pre and 2 hour PPG readings in the upper 100s and low 200s. Increased metformin dose today to 500mg  TID with meals (reported previous intolerance to taking 1000mg  at one time). Encouraged patient to walk outside more days each week. A1c and lipid panel  anticipated in March 2016. Written patient instructions provided.  Follow up in Pharmacist Clinic Visit 4 weeks. Total time in face to face counseling 30 minutes.  Patient seen with Fuller Canada, PharmD resident.

## 2014-12-01 NOTE — Patient Instructions (Addendum)
Great to see you today.   Please try to walk more distance AND more often if you can.  Walking will help with your blood sugar.   Increase your Metformin to three time daily.   500mg  with each meal.   No change in your insulin.   Next visit in Pharmacy Clinic in late March.

## 2014-12-01 NOTE — Assessment & Plan Note (Addendum)
Diabetes diagnosed in 2008 with improved control after recent addition of dinner time Novolog injection. Patient reports adherence to metformin 500mg  BID, Lantus 30 units daily, and Novolog 6 units with dinner. She reports 1 hypoglycemic episode with CBG of 84 which she responded to appropriately by eating a peppermint. Patient recently started taking metoprolol this past month; counseled patient regarding masked hypoglycemia symptoms with beta blocker use. Most fasting CBGs around 130s with average pre and 2 hour PPG readings in the upper 100s and low 200s. Increased metformin dose today to 500mg  TID with meals (reported previous intolerance to taking 1000mg  at one time). Encouraged patient to walk outside more days each week. A1c and lipid panel anticipated in March 2016. Written patient instructions provided.  Follow up in Pharmacist Clinic Visit 4 weeks. Total time in face to face counseling 30 minutes.  Patient seen with Fuller Canada, PharmD resident.

## 2014-12-04 NOTE — Progress Notes (Signed)
Patient ID: Dana Coleman, female   DOB: 1941/10/12, 74 y.o.   MRN: [id number] Reviewed: Agree with Dr. Graylin Shiver documentation and management.

## 2014-12-08 ENCOUNTER — Encounter: Payer: Self-pay | Admitting: Family Medicine

## 2014-12-08 ENCOUNTER — Ambulatory Visit (INDEPENDENT_AMBULATORY_CARE_PROVIDER_SITE_OTHER): Payer: Medicare Other | Admitting: Family Medicine

## 2014-12-08 VITALS — BP 124/73 | HR 59 | Temp 98.3°F | Ht 62.0 in | Wt 137.0 lb

## 2014-12-08 DIAGNOSIS — Z5181 Encounter for therapeutic drug level monitoring: Secondary | ICD-10-CM

## 2014-12-08 DIAGNOSIS — E1165 Type 2 diabetes mellitus with hyperglycemia: Secondary | ICD-10-CM

## 2014-12-08 DIAGNOSIS — Z8639 Personal history of other endocrine, nutritional and metabolic disease: Secondary | ICD-10-CM

## 2014-12-08 DIAGNOSIS — IMO0002 Reserved for concepts with insufficient information to code with codable children: Secondary | ICD-10-CM

## 2014-12-08 DIAGNOSIS — E663 Overweight: Secondary | ICD-10-CM

## 2014-12-08 DIAGNOSIS — E785 Hyperlipidemia, unspecified: Secondary | ICD-10-CM

## 2014-12-08 DIAGNOSIS — Z794 Long term (current) use of insulin: Secondary | ICD-10-CM

## 2014-12-08 DIAGNOSIS — I10 Essential (primary) hypertension: Secondary | ICD-10-CM

## 2014-12-08 LAB — POCT GLYCOSYLATED HEMOGLOBIN (HGB A1C): Hemoglobin A1C: 8.7

## 2014-12-08 LAB — CBC
HCT: 36.2 % (ref 36.0–46.0)
Hemoglobin: 11.7 g/dL — ABNORMAL LOW (ref 12.0–15.0)
MCH: 29 pg (ref 26.0–34.0)
MCHC: 32.3 g/dL (ref 30.0–36.0)
MCV: 89.6 fL (ref 78.0–100.0)
MPV: 11.9 fL (ref 8.6–12.4)
PLATELETS: 193 10*3/uL (ref 150–400)
RBC: 4.04 MIL/uL (ref 3.87–5.11)
RDW: 13.9 % (ref 11.5–15.5)
WBC: 9.4 10*3/uL (ref 4.0–10.5)

## 2014-12-08 LAB — TSH: TSH: 0.597 u[IU]/mL (ref 0.350–4.500)

## 2014-12-08 NOTE — Patient Instructions (Signed)
- High blood pressure is best treated by losing weight, taking medications as directed, avoiding salt in your diet and increasing potassium from fruits and vegetables (called the DASH diet). - Follow up in 3 months, or sooner if you have any concerns. If you feel faint, experience new/worsening chest pain or shortness of breath, or notice rapid leg swelling and/or weight gain you should call the clinic at [phone] OR go directly to the ER.   Take care,  - Dr. Bonner Puna  DASH Eating Plan DASH stands for "Dietary Approaches to Stop Hypertension." The DASH eating plan is a healthy eating plan that has been shown to reduce high blood pressure (hypertension). Additional health benefits may include reducing the risk of type 2 diabetes mellitus, heart disease, and stroke. The DASH eating plan may also help with weight loss. WHAT DO I NEED TO KNOW ABOUT THE DASH EATING PLAN? For the DASH eating plan, you will follow these general guidelines:  Choose foods with a percent daily value for sodium of less than 5% (as listed on the food label).  Use salt-free seasonings or herbs instead of table salt or sea salt.  Check with your health care provider or pharmacist before using salt substitutes.  Eat lower-sodium products, often labeled as "lower sodium" or "no salt added."  Eat fresh foods.  Eat more vegetables, fruits, and low-fat dairy products.  Choose whole grains. Look for the word "whole" as the first word in the ingredient list.  Choose fish and skinless chicken or [place] more often than red meat. Limit fish, poultry, and meat to 6 oz (170 g) each day.  Limit sweets, desserts, sugars, and sugary drinks.  Choose heart-healthy fats.  Limit cheese to 1 oz (28 g) per day.  Eat more home-cooked food and less restaurant, buffet, and fast food.  Limit fried foods.  Cook foods using methods other than frying.  Limit canned vegetables. If you do use them, rinse them well to decrease the  sodium.  When eating at a restaurant, ask that your food be prepared with less salt, or no salt if possible. WHAT FOODS CAN I EAT? Seek help from a dietitian for individual calorie needs. Grains Whole grain or whole wheat bread. Brown rice. Whole grain or whole wheat pasta. Quinoa, bulgur, and whole grain cereals. Low-sodium cereals. Corn or whole wheat flour tortillas. Whole grain cornbread. Whole grain crackers. Low-sodium crackers. Vegetables Fresh or frozen vegetables (raw, steamed, roasted, or grilled). Low-sodium or reduced-sodium tomato and vegetable juices. Low-sodium or reduced-sodium tomato sauce and paste. Low-sodium or reduced-sodium canned vegetables.  Fruits All fresh, canned (in natural juice), or frozen fruits. Meat and Other Protein Products Ground beef (85% or leaner), grass-fed beef, or beef trimmed of fat. Skinless chicken or [place]. Ground chicken or [place]. Pork trimmed of fat. All fish and seafood. Eggs. Dried beans, peas, or lentils. Unsalted nuts and seeds. Unsalted canned beans. Dairy Low-fat dairy products, such as skim or 1% milk, 2% or reduced-fat cheeses, low-fat ricotta or cottage cheese, or plain low-fat yogurt. Low-sodium or reduced-sodium cheeses. Fats and Oils Tub margarines without trans fats. Light or reduced-fat mayonnaise and salad dressings (reduced sodium). Avocado. Safflower, olive, or canola oils. Natural peanut or almond butter. Other Unsalted popcorn and pretzels.  WHAT FOODS ARE NOT RECOMMENDED? Grains White bread. White pasta. White rice. Refined cornbread. Bagels and croissants. Crackers that contain trans fat. Vegetables Creamed or fried vegetables. Vegetables in a cheese sauce. Regular canned vegetables. Regular canned tomato sauce and paste.  Regular tomato and vegetable juices. Fruits Dried fruits. Canned fruit in light or heavy syrup. Fruit juice. Meat and Other Protein Products Fatty cuts of meat. Ribs, chicken wings, bacon, sausage,  bologna, salami, chitterlings, fatback, hot dogs, bratwurst, and packaged luncheon meats. Salted nuts and seeds. Canned beans with salt. Dairy Whole or 2% milk, cream, half-and-half, and cream cheese. Whole-fat or sweetened yogurt. Full-fat cheeses or blue cheese. Nondairy creamers and whipped toppings. Processed cheese, cheese spreads, or cheese curds. Condiments Onion and garlic salt, seasoned salt, table salt, and sea salt. Canned and packaged gravies. Worcestershire sauce. Tartar sauce. Barbecue sauce. Teriyaki sauce. Soy sauce, including reduced sodium. Steak sauce. Fish sauce. Oyster sauce. Cocktail sauce. Horseradish. Ketchup and mustard. Meat flavorings and tenderizers. Bouillon cubes. Hot sauce. Tabasco sauce. Marinades. Taco seasonings. Relishes. Fats and Oils Butter, stick margarine, lard, shortening, ghee, and bacon fat. Coconut, palm kernel, or palm oils. Regular salad dressings. Other Pickles and olives. Salted popcorn and pretzels. The items listed above may not be a complete list of foods and beverages to avoid. Contact your dietitian for more information.

## 2014-12-08 NOTE — Progress Notes (Signed)
Subjective: Dana Coleman is a 74 y.o. female here for diabetes follow up.   Began insulin recently, taking 30 units lantus in the morning in addition to metformin - denies any GI upset. Checks blood sugar at home BID. Brings records: Over past week breakfast is 97-136 and supper 177-192. Lowest: 97 with no symptoms. She is able to explain common hypoglycemic symptoms and appropriate action plan in the event of these symptoms/low CBG.   Doesn't check BP at home. Misses 0 doses out of 7 days. is exercising by walking around neighborhood about 3-4 days per week, and is adherent to a low-salt diet. She is able to walk 3 blocks or so without dyspnea.No orthopnea or PND.   New concerns: None.  - ROS: Denies CP, SOB, palpitations, syncope, dizziness, orthopnea, PND, frequent headaches, vision changes, claudication, leg swelling. Denies fever, chills, weight loss, dizziness, vision changes, syncope, polyuria, nocturia, paresthesias, polydipsia, chest pain, new wounds.  - Dana Coleman: Updated. Lives alone, sister lives across the street, nonsmoker, no EtOH, no illicit drugs.  Objective: BP 124/73 mmHg  Pulse 59  Temp(Src) 98.3 F (36.8 C) (Oral)  Ht 5\' 2"  (1.575 m)  Wt 137 lb (62.143 kg)  BMI 25.05 kg/m2 Gen: Well-appearing 74 y.o.female in no distress HEENT: Normocephalic, sclerae/conjunctivae clear, PERRL, MMM, posterior oropharynx clear, good dentition on bottom, dentures in place on top Neck: Neck supple, no masses or lymphadenopathy; thyroid not enlarged  Pulm: Non-labored; CTAB, no wheezes  CV: Regular rate, no murmur appreciated; no LE edema, no JVD GI: Normoactive BS; soft, non-tender, non-distended, no HSM Skin: No wounds or rashes, no acanthosis nigricans See diabetic foot exam - simple  Neuro: CN II-XII without deficits, sensation intact to light touch, steady gait.  Assessment & Plan: Dana Coleman is a 74 y.o. female here for diabetes, improving but uncontrolled; HTN well  controlled.    See problem list for problem-specific plans.

## 2014-12-08 NOTE — Assessment & Plan Note (Signed)
On highest intensity statin, tolerating well. Rechecking lipid panel for adherence monitoring.

## 2014-12-08 NOTE — Assessment & Plan Note (Addendum)
History of incomplete surgical resection. No sxs. Will check TSH (last was years ago).

## 2014-12-08 NOTE — Assessment & Plan Note (Signed)
Benign essential HTN: Well controlled today; recent addition of betablocker noted, HR near 60.  - Check renal function - Primary elements of DASH diet and weight loss strategies reviewed in detail. Handout given.

## 2014-12-09 ENCOUNTER — Encounter: Payer: Self-pay | Admitting: Family Medicine

## 2014-12-09 LAB — LIPID PANEL
CHOL/HDL RATIO: 2.2 ratio
Cholesterol: 120 mg/dL (ref 0–200)
HDL: 54 mg/dL (ref >=46)
LDL Cholesterol: 49 mg/dL (ref 0–99)
Triglycerides: 85 mg/dL (ref <150)
VLDL: 17 mg/dL (ref 0–40)

## 2014-12-09 LAB — BASIC METABOLIC PANEL
BUN: 14 mg/dL (ref 6–23)
CO2: 29 mEq/L (ref 19–32)
CREATININE: 0.91 mg/dL (ref 0.50–1.10)
Calcium: 9.5 mg/dL (ref 8.4–10.5)
Chloride: 103 mEq/L (ref 96–112)
GLUCOSE: 123 mg/dL — AB (ref 70–99)
POTASSIUM: 4.2 meq/L (ref 3.5–5.3)
Sodium: 143 mEq/L (ref 135–145)

## 2014-12-15 ENCOUNTER — Telehealth: Payer: Self-pay | Admitting: Pharmacist

## 2014-12-15 MED ORDER — INSULIN LISPRO 100 UNIT/ML (KWIKPEN): 6.0000 [IU] | PEN_INJECTOR | Freq: Every day | SUBCUTANEOUS | Status: DC

## 2014-12-15 NOTE — Telephone Encounter (Signed)
Insulin Formulary switch requested by [employer] to Humalog.  Called to inform patient of change in insulin coverage from Novolog to Humalog.   Sent new prescription to [facility] for Humalog kwikpen.   Left message for patient on answering machine.

## 2015-01-01 ENCOUNTER — Other Ambulatory Visit: Payer: Self-pay | Admitting: *Deleted

## 2015-01-01 DIAGNOSIS — IMO0002 Reserved for concepts with insufficient information to code with codable children: Secondary | ICD-10-CM

## 2015-01-01 DIAGNOSIS — E1165 Type 2 diabetes mellitus with hyperglycemia: Secondary | ICD-10-CM

## 2015-01-01 DIAGNOSIS — Z794 Long term (current) use of insulin: Principal | ICD-10-CM

## 2015-01-02 ENCOUNTER — Encounter: Payer: Self-pay | Admitting: Pharmacist

## 2015-01-02 ENCOUNTER — Ambulatory Visit (INDEPENDENT_AMBULATORY_CARE_PROVIDER_SITE_OTHER): Payer: Medicare Other | Admitting: Pharmacist

## 2015-01-02 VITALS — BP 121/70 | HR 60 | Ht 62.0 in | Wt 136.0 lb

## 2015-01-02 DIAGNOSIS — I10 Essential (primary) hypertension: Secondary | ICD-10-CM

## 2015-01-02 DIAGNOSIS — Z794 Long term (current) use of insulin: Secondary | ICD-10-CM

## 2015-01-02 DIAGNOSIS — IMO0002 Reserved for concepts with insufficient information to code with codable children: Secondary | ICD-10-CM

## 2015-01-02 DIAGNOSIS — E1165 Type 2 diabetes mellitus with hyperglycemia: Secondary | ICD-10-CM

## 2015-01-02 NOTE — Progress Notes (Signed)
S:    Patient arrives in good spirits and ambulating well without assistance.    Presents for diabetes management and reports having Diabetes since 2012.   Patient reports adherence with medications (misses medications occasionally while out of the home). Current diabetes medications include metformin 500mg  TID before meals, insulin glargine 30 units QAM, and insulin lispro 6 units daily before supper.   Patient brought a record of her BG readings for the past 2 weeks:  BG before supper ranges from the 120s to 140s. After supper: 120 to 169.  Lowest number: 82 in the morning Highest number: 169 in the evening  Metformin caused stomach upset immediately after initiation but went away and she has no complaints at this time. She cannot tolerate 1000mg  as one dose. Patient denies hypoglycemic events.  Patient reported dietary habits: [place] sandwich, soup, sherbert after dinner. Drinks mostly water and some diet soda. Patient reported exercise habits: Walks (3 days/week)   Patient reports nocturia (2x/night). Patient denies neuropathy. Patient denies visual changes and will see eye doctor in the next week. Jenetta Downer:  . Lab Results  Component Value Date   HGBA1C 8.7 12/08/2014    BP Readings from Last 3 Encounters:  01/02/15 121/70  12/08/14 124/73  12/01/14 123/79      A/P: Diabetes diagnosed in 2012 currently under improved control based on home readings.   Denies hypoglycemic events and is able to verbalize appropriate hypoglycemia management plan.  Reports adherence with medication except for an infrequent missed dose. Control is suboptimal due to recent titration of medications that are not reflected in most recent A1c of 8.2 on 12/30/14 (last A1c was 12.1 on 08/14/14). Continue Lantus 30 units every morning and Humalog 6 units with supper. Continue metformin 500 mg TID. Patient instructed to check blood glucose multiple times a day (fasting and post-prandial).   Hypertension: currently  controlled on metoprolol succinate 25 mg daily. Pulse of 60 today. Patient denies orthostasis or dizziness. Continue to monitor at further visits and consider down-titrating medication or discontinuing if needed.   Next A1C anticipated June 2016.  Written patient instructions provided.  Follow up in Pharmacist Clinic Visit is not needed at this time due to well-controlled diabetes. Next visit with Dr. Bonner Puna.  Total time in face to face counseling 30 minutes. Richarda Osmond, PharmD Candidate, Elicia Lamp PharmD Resident, and Nicoletta Ba, PharmD Resident.

## 2015-01-02 NOTE — Patient Instructions (Signed)
It was great to see you today!  Your diabetes looks really well controlled. Continue to take all of your medications as you have been.  Continue to check your blood glucose in the morning before breakfast and in the evening. Also, add in a few readings after your last snack of the night.   Follow-up with Dr. Bonner Puna at your next appointment.

## 2015-01-02 NOTE — Assessment & Plan Note (Signed)
Diabetes diagnosed in 2012 currently under improved control based on home readings.   Denies hypoglycemic events and is able to verbalize appropriate hypoglycemia management plan.  Reports adherence with medication except for an infrequent missed dose. Control is suboptimal due to recent titration of medications that are not reflected in most recent A1c of 8.2 on 12/30/14 (last A1c was 12.1 on 08/14/14). Continue Lantus 30 units every morning and Humalog 6 units with supper. Continue metformin 500 mg TID. Patient instructed to check blood glucose multiple times a day (fasting and post-prandial).

## 2015-01-02 NOTE — Assessment & Plan Note (Signed)
Hypertension: currently controlled on metoprolol succinate 25 mg daily. Pulse of 60 today. Patient denies orthostasis or dizziness. Continue to monitor at further visits and consider down-titrating medication or discontinuing if needed.

## 2015-01-03 MED ORDER — METFORMIN HCL 500 MG PO TABS: 500.0000 mg | ORAL_TABLET | Freq: Three times a day (TID) | ORAL | Status: DC

## 2015-01-03 NOTE — Progress Notes (Signed)
Patient ID: Dana Coleman, female   DOB: 1941/10/09, 74 y.o.   MRN: [id number] Reviewed: Agree with Dr. Graylin Shiver documentation and management.

## 2015-01-03 NOTE — Addendum Note (Signed)
Addended by: Leavy Cella on: 01/03/2015 08:18 AM   Modules accepted: Orders

## 2015-02-07 ENCOUNTER — Encounter: Payer: Self-pay | Admitting: Family Medicine

## 2015-02-07 ENCOUNTER — Ambulatory Visit (INDEPENDENT_AMBULATORY_CARE_PROVIDER_SITE_OTHER): Payer: Medicare Other | Admitting: Family Medicine

## 2015-02-07 ENCOUNTER — Ambulatory Visit
Admission: RE | Admit: 2015-02-07 | Discharge: 2015-02-07 | Disposition: A | Discharge status: Home / Self Care | Payer: Medicare Other | Source: Ambulatory Visit | Attending: Family Medicine | Admitting: Family Medicine

## 2015-02-07 VITALS — BP 122/72 | HR 60 | Temp 98.5°F | Ht 62.0 in | Wt 137.0 lb

## 2015-02-07 DIAGNOSIS — M545 Low back pain, unspecified: Secondary | ICD-10-CM

## 2015-02-07 DIAGNOSIS — M549 Dorsalgia, unspecified: Secondary | ICD-10-CM | POA: Insufficient documentation

## 2015-02-07 MED ORDER — BACLOFEN 10 MG PO TABS: 10.0000 mg | ORAL_TABLET | Freq: Three times a day (TID) | ORAL | Status: DC

## 2015-02-07 NOTE — Patient Instructions (Signed)
It was great seeing you today.   1. Your back pain is likely due to muscle spasm. You should use heating pad as often as you like. Take tylenol 650 mg up to 4 times a day for the pain.  2. You can take Baclofen (muscle relaxer) up to three times a day. Do not drive while taking this medication and be careful walking if this medication makes you dizzy.  3. I will get an Xray and send you a letter with the result or call we need to discuss anything.  4. Come back for re-evaluation if your pain is worsening or not improving after 3 - 4 weeks.    If you have any questions or concerns before then, please call the clinic at [phone].  Take Care,   Dr Phill Myron   Back Exercises Back exercises help treat and prevent back injuries. The goal is to increase your strength in your belly (abdominal) and back muscles. These exercises can also help with flexibility. Start these exercises when told by your doctor. HOME CARE Back exercises include: Pelvic Tilt.  Lie on your back with your knees bent. Tilt your pelvis until the lower part of your back is against the floor. Hold this position 5 to 10 sec. Repeat this exercise 5 to 10 times. Knee to Chest. 5. Pull 1 knee up against your chest and hold for 20 to 30 seconds. Repeat this with the other knee. This may be done with the other leg straight or bent, whichever feels better. Then, pull both knees up against your chest. Sit-Ups or Curl-Ups.  Bend your knees 90 degrees. Start with tilting your pelvis, and do a partial, slow sit-up. Only lift your upper half 30 to 45 degrees off the floor. Take at least 2 to 3 seonds for each sit-up. Do not do sit-ups with your knees out straight. If partial sit-ups are difficult, simply do the above but with only tightening your belly (abdominal) muscles and holding it as told. Hip-Lift.  Lie on your back with your knees flexed 90 degrees. Push down with your feet and shoulders as you raise your hips 2 inches  off the floor. Hold for 10 seconds, repeat 5 to 10 times. Back Arches. 1. Lie on your stomach. Prop yourself up on bent elbows. Slowly press on your hands, causing an arch in your low back. Repeat 3 to 5 times. Shoulder-Lifts. 1. Lie face down with arms beside your body. Keep hips and belly pressed to floor as you slowly lift your head and shoulders off the floor. Do not overdo your exercises. Be careful in the beginning. Exercises may cause you some mild back discomfort. If the pain lasts for more than 15 minutes, stop the exercises until you see your doctor. Improvement with exercise for back problems is slow.  Document Released: 11/01/2010 Document Revised: 12/22/2011 Document Reviewed: 07/31/2011 [facility] Patient Information 2015 [place], [place]. This information is not intended to replace advice given to you by your health care provider. Make sure you discuss any questions you have with your health care provider.

## 2015-02-07 NOTE — Assessment & Plan Note (Signed)
Right lower back pain likely muscle skeletal etiology. However, given age greater than 67, and history of cancer-  Will obtain lumbar x-rays.  - Baclofen 10 mg 3 times a day when necessary - Tylenol 650 mg 4 times a day when necessary - Advised on lower back stretches and exercises - Follow-up in clinic in 4 weeks if not improving or sooner if new or worrisome symptoms

## 2015-02-07 NOTE — Progress Notes (Signed)
   Subjective:    Patient ID: Dana Coleman, female    DOB: 17-Jul-1941, 74 y.o.   MRN: [id number]  Seen for Same day visit for   CC: Right lower back pain  She reports right lower back/side pain for the past 1-2 weeks.  Pain is achy, crampy in nature and constant.  She works as a Engineer, building services does not recall any injury or trauma, or specific onset of pain.  Reports 1 previous episode of back pain that resolved with muscle relaxers.  She denies any sciatica, saddle anesthesia, or bladder dysfunction, fevers or chills, or weight loss; however, does report a history of both cervical cancer treated with cone and breast cancer treated with lumpectomy and radiation/chemotherapy.  She reports she was given a clean bill of health for breast cancer 2 years ago.  Also reports history of benign thyroid nodule that was removed.  Denies any recent infections.  Denies any dysuria or hematuria.  Does report night sweats; Says it's been occurring for years  Review of Systems   See HPI for ROS. Objective:  BP 122/72 mmHg  Pulse 60  Temp(Src) 98.5 F (36.9 C) (Oral)  Ht 5\' 2"  (1.575 m)  Wt 137 lb (62.143 kg)  BMI 25.05 kg/m2  General: NAD Back: Normal skin w/o rash, Spine with normal alignment and no deformity. no tenderness to vertebral process palpation.  Right paraspinous muscles are mildly tender and without spasm.   Range of motion is full at lumbar sacral regions.  Lower extremity Motor and sensory intact; patellar reflexes 1+ bilaterally.  Negative right straight leg raise.   Assessment & Plan:  See Problem List Documentation

## 2015-03-09 ENCOUNTER — Encounter: Payer: Self-pay | Admitting: Gastroenterology

## 2015-04-25 ENCOUNTER — Other Ambulatory Visit: Payer: Self-pay | Admitting: Family Medicine

## 2015-05-03 ENCOUNTER — Ambulatory Visit (INDEPENDENT_AMBULATORY_CARE_PROVIDER_SITE_OTHER): Payer: Medicare Other | Admitting: Family Medicine

## 2015-05-03 ENCOUNTER — Encounter: Payer: Self-pay | Admitting: Family Medicine

## 2015-05-03 ENCOUNTER — Other Ambulatory Visit: Payer: Self-pay | Admitting: Family Medicine

## 2015-05-03 VITALS — BP 122/67 | Temp 98.3°F | Ht 62.0 in | Wt 138.0 lb

## 2015-05-03 DIAGNOSIS — Z794 Long term (current) use of insulin: Secondary | ICD-10-CM

## 2015-05-03 DIAGNOSIS — Z9889 Other specified postprocedural states: Secondary | ICD-10-CM

## 2015-05-03 DIAGNOSIS — E119 Type 2 diabetes mellitus without complications: Secondary | ICD-10-CM

## 2015-05-03 DIAGNOSIS — IMO0002 Reserved for concepts with insufficient information to code with codable children: Secondary | ICD-10-CM

## 2015-05-03 DIAGNOSIS — Z853 Personal history of malignant neoplasm of breast: Secondary | ICD-10-CM

## 2015-05-03 DIAGNOSIS — E1165 Type 2 diabetes mellitus with hyperglycemia: Secondary | ICD-10-CM | POA: Diagnosis not present

## 2015-05-03 LAB — POCT GLYCOSYLATED HEMOGLOBIN (HGB A1C): Hemoglobin A1C: 7.8

## 2015-05-03 MED ORDER — INSULIN PEN NEEDLE 31G X 5 MM MISC: Status: DC

## 2015-05-03 NOTE — Progress Notes (Signed)
Subjective: Dana Coleman is a 74 y.o. female patient of mine presenting for IDDM.  She was diagnosed with T2DM in 2012, reports 100% compliance with metformin 500mg  TID AC, lantus 30u qAM, and lispro 6u before her largest meal - supper. Averages about 120mg /dl fasting CBG and 140 or so PM readings. Denies hypoglycemic symptoms. No visual changes, neuropathic pain/paresthesias, polyuria, weight loss. She was last seen by the Pharmacy Clinic on 3/22, though no medication changes were made.   - ROS: As above - Non-smoker  Objective: BP 122/67 mmHg  Temp(Src) 98.3 F (36.8 C) (Oral)  Ht 5\' 2"  (1.575 m)  Wt 138 lb (62.596 kg)  BMI 25.23 kg/m2 Gen: Well-appearing  74 y.o. female in no distress HEENT: MMM, posterior oropharynx clear Pulm: Non-labored; CTAB, no wheezes  CV: Regular rate, no murmur appreciated; distal pulses intact/symmetric GI: +BS; soft, non-tender, non-distended Skin: No rashes, wounds, ulcers Neuro: A&Ox3, CN II-XII without deficits  Wt Readings from Last 3 Encounters:  05/03/15 138 lb (62.596 kg)  02/07/15 137 lb (62.143 kg)  01/02/15 136 lb (61.689 kg)   Hb A1c 3/19: 8.2% Hb A1c 7/21: 7.8%  Assessment/Plan: Dana Coleman is a 74 y.o. female here for diabetes, improving control.  See problem list for plan.

## 2015-05-03 NOTE — Patient Instructions (Signed)

## 2015-05-03 NOTE — Assessment & Plan Note (Signed)
Type II diabetes mellitus: Hb A1c: 7.8%; goal in this 74 yo with CAD is 8%. No medication changes. On ASA, statin, ARB. F/u 6 months

## 2015-05-24 ENCOUNTER — Ambulatory Visit
Admission: RE | Admit: 2015-05-24 | Discharge: 2015-05-24 | Disposition: A | Discharge status: Home / Self Care | Payer: Medicare Other | Source: Ambulatory Visit | Attending: *Deleted | Admitting: *Deleted

## 2015-05-24 DIAGNOSIS — R928 Other abnormal and inconclusive findings on diagnostic imaging of breast: Secondary | ICD-10-CM | POA: Diagnosis not present

## 2015-05-24 DIAGNOSIS — Z853 Personal history of malignant neoplasm of breast: Secondary | ICD-10-CM

## 2015-05-24 DIAGNOSIS — Z9889 Other specified postprocedural states: Secondary | ICD-10-CM

## 2015-06-08 ENCOUNTER — Other Ambulatory Visit: Payer: Self-pay | Admitting: *Deleted

## 2015-06-08 MED ORDER — LANTUS SOLOSTAR 100 UNIT/ML ~~LOC~~ SOPN: 30.0000 [IU] | PEN_INJECTOR | Freq: Every morning | SUBCUTANEOUS | Status: DC

## 2015-06-08 NOTE — Telephone Encounter (Signed)
Pt calling to check status of this request. Dana Coleman, ASA

## 2015-06-09 ENCOUNTER — Other Ambulatory Visit: Payer: Self-pay | Admitting: Family Medicine

## 2015-08-02 ENCOUNTER — Other Ambulatory Visit: Payer: Self-pay | Admitting: Family Medicine

## 2015-08-09 ENCOUNTER — Ambulatory Visit (INDEPENDENT_AMBULATORY_CARE_PROVIDER_SITE_OTHER): Payer: Medicare Other | Admitting: *Deleted

## 2015-08-09 DIAGNOSIS — Z23 Encounter for immunization: Secondary | ICD-10-CM

## 2015-09-25 ENCOUNTER — Other Ambulatory Visit: Payer: Self-pay | Admitting: *Deleted

## 2015-09-25 DIAGNOSIS — Z794 Long term (current) use of insulin: Principal | ICD-10-CM

## 2015-09-25 DIAGNOSIS — IMO0002 Reserved for concepts with insufficient information to code with codable children: Secondary | ICD-10-CM

## 2015-09-25 DIAGNOSIS — E1165 Type 2 diabetes mellitus with hyperglycemia: Secondary | ICD-10-CM

## 2015-09-25 MED ORDER — METFORMIN HCL 500 MG PO TABS: 500.0000 mg | ORAL_TABLET | Freq: Three times a day (TID) | ORAL | Status: DC

## 2015-09-25 NOTE — Telephone Encounter (Signed)
Requesting 90 day supply. Velora Heckler, RN

## 2015-11-06 ENCOUNTER — Encounter: Payer: Self-pay | Admitting: Cardiology

## 2015-11-06 DIAGNOSIS — I2510 Atherosclerotic heart disease of native coronary artery without angina pectoris: Secondary | ICD-10-CM | POA: Diagnosis not present

## 2015-11-06 DIAGNOSIS — E119 Type 2 diabetes mellitus without complications: Secondary | ICD-10-CM | POA: Diagnosis not present

## 2015-11-06 DIAGNOSIS — E785 Hyperlipidemia, unspecified: Secondary | ICD-10-CM | POA: Diagnosis not present

## 2015-11-06 DIAGNOSIS — I10 Essential (primary) hypertension: Secondary | ICD-10-CM | POA: Diagnosis not present

## 2015-11-06 DIAGNOSIS — I252 Old myocardial infarction: Secondary | ICD-10-CM | POA: Diagnosis not present

## 2015-11-06 NOTE — Progress Notes (Unsigned)
Patient ID: Dana Coleman, female   DOB: 09/07/1941, 75 y.o.   MRN: [id number]   Dana, Coleman  Date of visit:  11/06/2015 DOB:  11-12-1940    Age:  75 yrs. Medical record number:  [id number]     Account number:  [id number] Primary Care Provider: [place] ____________________________ CURRENT DIAGNOSES  1. Hyperlipidemia  2. CAD Native without angina  3. Essential (primary) hypertension  4. Type 2 diabetes mellitus without complications  5. Old myocardial infarction  6. Personal history of malignant neoplasm of breast ____________________________ ALLERGIES  No Known Drug Allergies ____________________________ MEDICATIONS  1. Crestor 40 mg Tablet, 1 p.o. daily  2. Tylenol Ex Str Arthritis Pain 500 mg Tablet, PRN  3. aspirin 81 mg Tablet, Chewable, 1 p.o. q.d.  4. cholecalciferol (vitamin D3) 2,000 unit capsule, 1 p.o. daily  5. Benicar HCT 40 mg-12.5 mg tablet, 1 p.o. daily  6. Lantus Solostar 100 unit/mL (3 mL) subcutaneous insulin pen, 30u qam  7. metoprolol succinate ER 25 mg tablet,extended release 24 hr, 1 p.o. daily  8. amlodipine 5 mg tablet, 1 p.o. daily  9. nitroglycerin 0.4 mg sublingual tablet, PRN  10. Humalog 100 unit/mL subcutaneous cartridge, 6 u dinner  11. metformin 500 mg tablet, TID ____________________________ HISTORY OF PRESENT ILLNESS Patient seen for cardiac followup. She has been doing well since she was previously here. She denies angina and has no PND, orthopnea, syncope, palpitations, or claudication. Her lipids were reviewed today and are under good control. She has gained about 7 pounds of weight since she was here. Has able to get some exercise. ____________________________ PAST HISTORY  Past Medical Illnesses:  hypertension, DM-non-insulin dependent, hyperlipidemia, history of thyroid nodule, breast cancer 2009 treated with lumpectomy and radiation, history of cervical cancer treated with conization;  Cardiovascular Illnesses:  CAD,  S/P MI-inferior February 2004;  Surgical Procedures:  partial thyroidectomy, breast lumpectomy;  NYHA Classification:  I;  Canadian Angina Classification:  Class 0: Asymptomatic;  Cardiology Procedures-Invasive:  stent RCA February 2004, cardiac cath (left), cardiac cath (left) July 2015;  Cardiology Procedures-Noninvasive:  treadmill Myoview July 2015;  Cardiac Cath Results:  normal Left main, 50% stenosis proximal LAD, 50-60% mid, 95-99% at ostium of medium diagonal branch, 40% stenosis proximal OM 1, RCA stent patent with 50% instent restenosis, 50-70% along length of RCA;  LVEF of 50% documented via cardiac cath on 04/20/2014,   ____________________________ CARDIO-PULMONARY TEST DATES EKG Date:  03/29/2013;   Cardiac Cath Date:  04/20/2014;  Stent Placement Date: 11/24/2002;  Nuclear Study Date:  04/06/2014;  Chest Xray Date: 04/11/2014;   ____________________________ FAMILY HISTORY Brother -- Brother alive and well Brother -- Brother dead Father -- Father dead, Cancer Mother -- Mother dead, Heart disease Sister -- Sister alive and well Sister -- Sister alive and well Sister -- Sister alive and well ____________________________ SOCIAL HISTORY Alcohol Use:  drinks occasionally;  Smoking:  never smoked;  Diet:  diabetic diet;  Lifestyle:  separated and 4 daughters;  Exercise:  exercises daily and walking;  Occupation:  cleans houses;  Residence:  lives alone;   ____________________________ REVIEW OF SYSTEMS General:  obesity, weight gain of approximately 5 lbs Eyes: cataracts Respiratory: mild dyspnea with exertion Cardiovascular:  please review HPI Abdominal: denies dyspepsia, GI bleeding, constipation, or diarrheaGenitourinary-Female: no dysuria, urgency, frequency, UTIs, or stress incontinence Musculoskeletal:  arthritis of the fingers Neurological:  denies headaches, stroke, or TIA  ____________________________ PHYSICAL EXAMINATION VITAL SIGNS  Blood Pressure:  132/70 Sitting, Right  arm,  regular cuff  , 134/74 Standing, Right arm and regular cuff   Pulse:  60/min. Weight:  141.50 lbs. Height:  62"BMI: 26  Constitutional:  pleasant, mildly obese, African American, female, in no acute distress Skin:  hyperpigmentation of abdomen Head:  normocephalic, normal hair pattern, no masses or tenderness ENT:  ears, nose and throat reveal no gross abnormalities.  Dentition good. Neck:  supple, no masses, thyromegaly, JVD. Carotid pulses are full and equal bilaterally without bruits. Chest:  normal symmetry, clear to auscultation. Cardiac:  regular rhythm, normal S1 and S2, No S3 or S4, no murmurs, gallops or rubs detected. Peripheral Pulses:  the femoral,dorsalis pedis, and posterior tibial pulses are full and equal bilaterally with no bruits auscultated. Extremities & Back:  No edema, no deformity, normal range of motion Neurological:  no gross motor or sensory deficits noted, affect appropriate, oriented x3. ____________________________ MOST RECENT LIPID PANEL 12/08/14  CHOL TOTL 120 mg/dl, LDL 49 NM, HDL 54 mg/dl and TRIGLYCER 85 mg/dl ____________________________ IMPRESSIONS/PLAN  1. Coronary artery disease with previous myocardial infarction with no angina 2. Prior cardiac stent 3. Hyperlipidemia under excellent control 4. Hypertension controlled  Recommendations:  She is clinically doing quite well at present time. She has gained about 7 pounds of weight since she was here. Discussed importance of weight loss with her. Recommended followup in one year. ____________________________ TODAYS ORDERS  1. Return Visit: 1 year  2. 12 Lead EKG: 1 year                       ____________________________ Cardiology Physician:  Kerry Hough MD [facility]

## 2015-11-16 ENCOUNTER — Encounter: Payer: Self-pay | Admitting: Family Medicine

## 2015-11-16 ENCOUNTER — Ambulatory Visit (INDEPENDENT_AMBULATORY_CARE_PROVIDER_SITE_OTHER): Payer: Medicare Other | Admitting: Family Medicine

## 2015-11-16 VITALS — BP 134/82 | HR 60 | Temp 98.0°F | Wt 143.9 lb

## 2015-11-16 DIAGNOSIS — E1165 Type 2 diabetes mellitus with hyperglycemia: Secondary | ICD-10-CM | POA: Diagnosis not present

## 2015-11-16 DIAGNOSIS — I10 Essential (primary) hypertension: Secondary | ICD-10-CM | POA: Diagnosis not present

## 2015-11-16 DIAGNOSIS — IMO0002 Reserved for concepts with insufficient information to code with codable children: Secondary | ICD-10-CM

## 2015-11-16 DIAGNOSIS — Z23 Encounter for immunization: Secondary | ICD-10-CM

## 2015-11-16 DIAGNOSIS — Z794 Long term (current) use of insulin: Secondary | ICD-10-CM | POA: Diagnosis not present

## 2015-11-16 LAB — POCT GLYCOSYLATED HEMOGLOBIN (HGB A1C): Hemoglobin A1C: 8.2

## 2015-11-16 MED ORDER — PNEUMOCOCCAL 13-VAL CONJ VACC IM SUSP
0.5000 mL | INTRAMUSCULAR | Status: AC
  Administered 2015-11-16: 0.5 mL via INTRAMUSCULAR

## 2015-11-16 NOTE — Assessment & Plan Note (Signed)
At goal, continue current care.

## 2015-11-16 NOTE — Addendum Note (Signed)
Addended by: Londell Moh T on: 11/16/2015 05:06 PM   Modules accepted: Orders

## 2015-11-16 NOTE — Assessment & Plan Note (Signed)
Hb A1c: 8.2%, stable over past 12 months. Goal Hb A1c: < 8.0%.  - After discussing her weight gain and slight worsening of glycemic control, she opts to cut down on carbohydrates and increase activity and follow up in 3 months instead of increasing insulin or metformin. - Continue lantus and lispro with metformin 1500mg  TDD - Continue ASA, benicar, crestor - Follow up in 3 months to include Hb A1c, and foot exam.

## 2015-11-16 NOTE — Progress Notes (Signed)
Subjective: Dana Coleman is a 75 y.o. female patient of mine presenting for IDDM.  She was diagnosed with T2DM in 2012, reports 100% compliance with metformin 500mg  TID AC, lantus 30u qAM, and lispro 6u before her largest meal - supper. Checks sugars in the morning with ranges 88 to 170, average about 130. Denies hypoglycemic symptoms. No visual changes, neuropathic pain/paresthesias, polyuria, weight loss.  - Last eye exam: April 2016, "normal" will return this year.   - ROS: As above - Non-smoker  Objective: BP 134/82 mmHg  Pulse 60  Temp(Src) 98 F (36.7 C) (Oral)  Wt 143 lb 14.4 oz (65.273 kg) Gen: Well-appearing  75 y.o. female in no distress HEENT: MMM, posterior oropharynx clear Pulm: Non-labored; CTAB, no wheezes  CV: Regular rate, no murmur appreciated; distal pulses intact/symmetric GI: +BS; soft, non-tender, non-distended Skin: No rashes, wounds, ulcers Neuro: A&Ox3, CN II-XII without deficits  Hb A1c 12/30/2014: 8.2% Hb A1c 05/03/2015: 7.8% Hb A1c 11/16/2015: 8.2%  Assessment/Plan: Dana Coleman is a 75 y.o. female here for diabetes.  Insulin dependent type 2 diabetes mellitus, uncontrolled (HCC) Hb A1c: 8.2%, stable over past 12 months. Goal Hb A1c: < 8.0%.  - After discussing her weight gain and slight worsening of glycemic control, she opts to cut down on carbohydrates and increase activity and follow up in 3 months instead of increasing insulin or metformin. - Continue lantus and lispro with metformin 1500mg  TDD - Continue ASA, benicar, crestor - Follow up in 3 months to include Hb A1c, and foot exam.  Hypertension At goal, continue current care.   Getting PNA 2 of 2 today.

## 2015-11-16 NOTE — Patient Instructions (Signed)
Thank you for coming in today!  Try to avoid carbohydrates (rice, breads, pasta, tortillas) as much as possible and stay active - aim to get 60 minutes of activity every day in some form or another.   Our clinic's number is [phone]. Feel free to call any time with questions or concerns. We will answer any questions after hours with our 24-hour emergency line at that number as well.   - Dr. Bonner Puna

## 2015-12-25 ENCOUNTER — Other Ambulatory Visit: Payer: Self-pay | Admitting: Family Medicine

## 2016-01-14 DIAGNOSIS — E119 Type 2 diabetes mellitus without complications: Secondary | ICD-10-CM | POA: Diagnosis not present

## 2016-01-14 DIAGNOSIS — H524 Presbyopia: Secondary | ICD-10-CM | POA: Diagnosis not present

## 2016-01-14 DIAGNOSIS — H2513 Age-related nuclear cataract, bilateral: Secondary | ICD-10-CM | POA: Diagnosis not present

## 2016-01-14 LAB — HM DIABETES EYE EXAM

## 2016-01-19 ENCOUNTER — Other Ambulatory Visit: Payer: Self-pay | Admitting: Family Medicine

## 2016-01-19 DIAGNOSIS — E119 Type 2 diabetes mellitus without complications: Secondary | ICD-10-CM

## 2016-01-24 ENCOUNTER — Encounter: Payer: Self-pay | Admitting: Ophthalmology

## 2016-02-07 DIAGNOSIS — H2512 Age-related nuclear cataract, left eye: Secondary | ICD-10-CM | POA: Diagnosis not present

## 2016-02-07 DIAGNOSIS — H25812 Combined forms of age-related cataract, left eye: Secondary | ICD-10-CM | POA: Diagnosis not present

## 2016-02-27 ENCOUNTER — Other Ambulatory Visit: Payer: Self-pay | Admitting: *Deleted

## 2016-02-27 DIAGNOSIS — IMO0002 Reserved for concepts with insufficient information to code with codable children: Secondary | ICD-10-CM

## 2016-02-27 DIAGNOSIS — Z794 Long term (current) use of insulin: Principal | ICD-10-CM

## 2016-02-27 DIAGNOSIS — E1165 Type 2 diabetes mellitus with hyperglycemia: Secondary | ICD-10-CM

## 2016-02-27 MED ORDER — METFORMIN HCL 500 MG PO TABS: 500.0000 mg | ORAL_TABLET | Freq: Three times a day (TID) | ORAL | Status: DC

## 2016-02-27 NOTE — Telephone Encounter (Signed)
Refill request for 90 day supply.  Martin, Tamika L, RN  

## 2016-04-24 ENCOUNTER — Other Ambulatory Visit: Payer: Self-pay | Admitting: *Deleted

## 2016-04-25 ENCOUNTER — Other Ambulatory Visit: Payer: Self-pay | Admitting: Family Medicine

## 2016-04-25 DIAGNOSIS — Z1231 Encounter for screening mammogram for malignant neoplasm of breast: Secondary | ICD-10-CM

## 2016-04-25 MED ORDER — ROSUVASTATIN CALCIUM 40 MG PO TABS: ORAL_TABLET | ORAL | Status: DC

## 2016-04-25 NOTE — Telephone Encounter (Signed)
2nd request.  Martin, Tamika L, RN  

## 2016-05-13 ENCOUNTER — Other Ambulatory Visit: Payer: Self-pay | Admitting: *Deleted

## 2016-05-14 MED ORDER — GLUCOSE BLOOD VI STRP: 1.0000 | ORAL_STRIP | 6 refills | Status: DC

## 2016-05-21 NOTE — Progress Notes (Signed)
   CC: rectal bleeding  HPI: Rectal bleeding: Dana Coleman is a 75 y.o. female who presents to [facility] today with spots of blood on the toilet paper when she wipes after defecation for 3 months duration.   She notes that the blood appears only with defecation (she is certain it is rectal in origin and not vaginal) and the blood is intermittent.  She denies pain with defecation, denies hard stools. No bloody streaks on the tissue paper.  No melena, no nausea vomiting or diarrhea.  She is on a 5-year schedule for colonoscopy because of history of removal of pre-cancerous polyps.  Diabetes: A1C is elevated today at 8.8.  Goal for patient is 8.0.  She admits to challenges in taking her CBG's because she does not like sticking her finger.  She admits to sometimes forgetting to take her PM humalog because she goes out for dinner. No paresthesias, vision changes, polyuria/polydipsia/polyphasia.   Review of Symptoms: See HPI for ROS.   CC, SH/smoking status, and VS noted.  Objective: BP 115/70   Pulse 62   Temp 98.3 F (36.8 C) (Oral)   Ht 5\' 2"  (1.575 m)   Wt 141 lb 12.8 oz (64.3 kg)   BMI 25.94 kg/m  GEN: NAD, alert, cooperative, and pleasant. NECK: full ROM RESPIRATORY: clear to auscultation bilaterally with no wheezes, rhonchi or rales, good effort CV: RRR, no m/r/g, no peripheral edema GI: soft, non-tender, non-distended, normoactive bowel sounds, no hepatosplenomegaly SKIN: warm and dry, no rashes or lesions PSYCH: AAOx3, appropriate affect RECTAL: anoscopy performed with +2cm protrusion consistent with an external or partially prolapsed internal hemorrhoid   Assessment and plan:  Rectal bleeding Most likely secondary to hemorrhoid visualized on exam. - anoscopy performed, WNL except for hemorrhoid - FOBT negative - patient referred to general surgery for possible banding - Point of care H&H with mild anemia, Hgb 10.9.  Can follow up with repeat H&H at next visit or if patient  becomes symptomatic  Insulin dependent type 2 diabetes mellitus, uncontrolled ([place]) Patient currently takes 30 Lantus in the morning, 6u Humalog with dinner.  - HgbA1C elevated at 8.8 on today's visit.  Goal is 8.0 - Patient does not take CBG's regularly - She will take AM CBG every day for 1 week and then follow up with phone call to report this data so that regimen might be adjusted over the phone. - follow up in the office in 3 months for diabetes check (November 2017)   Orders Placed This Encounter  Procedures  . Ambulatory referral to General Surgery    Referral Priority:   Routine    Referral Type:   Surgical    Referral Reason:   Specialty Services Required    Requested Specialty:   General Surgery    Number of Visits Requested:   1  . HgB A1c  . POCT hemoglobin  . POCT occult blood stool    No orders of the defined types were placed in this encounter.    Everrett Coombe, MD,MS,  PGY1 05/23/2016 8:43 PM

## 2016-05-22 ENCOUNTER — Ambulatory Visit (INDEPENDENT_AMBULATORY_CARE_PROVIDER_SITE_OTHER): Payer: Medicare Other | Admitting: Student in an Organized Health Care Education/Training Program

## 2016-05-22 ENCOUNTER — Encounter: Payer: Self-pay | Admitting: Student in an Organized Health Care Education/Training Program

## 2016-05-22 VITALS — BP 115/70 | HR 62 | Temp 98.3°F | Ht 62.0 in | Wt 141.8 lb

## 2016-05-22 DIAGNOSIS — E1165 Type 2 diabetes mellitus with hyperglycemia: Secondary | ICD-10-CM

## 2016-05-22 DIAGNOSIS — K648 Other hemorrhoids: Secondary | ICD-10-CM

## 2016-05-22 DIAGNOSIS — Z794 Long term (current) use of insulin: Secondary | ICD-10-CM

## 2016-05-22 DIAGNOSIS — IMO0002 Reserved for concepts with insufficient information to code with codable children: Secondary | ICD-10-CM

## 2016-05-22 DIAGNOSIS — K625 Hemorrhage of anus and rectum: Secondary | ICD-10-CM

## 2016-05-22 LAB — POCT HEMOGLOBIN: Hemoglobin: 10.9 g/dL — AB (ref 12.2–16.2)

## 2016-05-22 LAB — HEMOCCULT GUIAC POC 1CARD (OFFICE): FECAL OCCULT BLD: NEGATIVE

## 2016-05-22 LAB — POCT GLYCOSYLATED HEMOGLOBIN (HGB A1C): HEMOGLOBIN A1C: 8.8

## 2016-05-22 NOTE — Patient Instructions (Addendum)
It was a pleasure seeing you today in our clinic. Today we discussed your rectal bleeding and your diabetes. Here is the treatment plan we have discussed and agreed upon together:  Diabetes  - For your diabetes, please take your finger stick glucose for 1 week, every morning before breakfast, and then call our office to let me know those numbers.  I will review them and may be able to adjust your insulin regimen over the phone  Rectal Bleeding - For your rectal bleeding, we will do the anoscopy and fecal occult blood card in the office today. - I would also like you to follow up with a general surgeon for management of your hemorrhoid. You will get a phone call to schedule this appointment within the next two weeks.  If you do not get a call, please call our office. - We also did a blood test to check for anemia.  I will call you with these results.  This is a lot of information. Our clinic's number is [phone]. Please call with questions or concerns about what we discussed today.  - Dr. Burr Medico

## 2016-05-23 DIAGNOSIS — K625 Hemorrhage of anus and rectum: Secondary | ICD-10-CM | POA: Insufficient documentation

## 2016-05-23 NOTE — Assessment & Plan Note (Signed)
Patient currently takes 30 Lantus in the morning, 6u Humalog with dinner.  - HgbA1C elevated at 8.8 on today's visit.  Goal is 8.0 - Patient does not take CBG's regularly - She will take AM CBG every day for 1 week and then follow up with phone call to report this data so that regimen might be adjusted over the phone. - follow up in the office in 3 months for diabetes check (November 2017)

## 2016-05-23 NOTE — Assessment & Plan Note (Addendum)
Most likely secondary to hemorrhoid visualized on exam. - anoscopy performed, WNL except for hemorrhoid - FOBT negative - patient referred to general surgery for possible banding - Point of care H&H with mild anemia, Hgb 10.9.  Can follow up with repeat H&H at next visit or if patient becomes symptomatic

## 2016-05-26 ENCOUNTER — Ambulatory Visit
Admission: RE | Admit: 2016-05-26 | Discharge: 2016-05-26 | Disposition: A | Discharge status: Home / Self Care | Payer: Medicare Other | Source: Ambulatory Visit | Attending: Family Medicine | Admitting: Family Medicine

## 2016-05-26 DIAGNOSIS — Z1231 Encounter for screening mammogram for malignant neoplasm of breast: Secondary | ICD-10-CM

## 2016-06-02 ENCOUNTER — Other Ambulatory Visit: Payer: Self-pay | Admitting: *Deleted

## 2016-06-02 MED ORDER — OLMESARTAN MEDOXOMIL-HCTZ 40-12.5 MG PO TABS: 1.0000 | ORAL_TABLET | Freq: Every day | ORAL | 3 refills | Status: DC

## 2016-06-12 DIAGNOSIS — K644 Residual hemorrhoidal skin tags: Secondary | ICD-10-CM | POA: Diagnosis not present

## 2016-06-12 DIAGNOSIS — K642 Third degree hemorrhoids: Secondary | ICD-10-CM | POA: Diagnosis not present

## 2016-06-30 ENCOUNTER — Other Ambulatory Visit: Payer: Self-pay | Admitting: Family Medicine

## 2016-07-01 ENCOUNTER — Other Ambulatory Visit: Payer: Self-pay | Admitting: *Deleted

## 2016-07-01 MED ORDER — INSULIN GLARGINE 100 UNIT/ML SOLOSTAR PEN: PEN_INJECTOR | SUBCUTANEOUS | 3 refills | Status: DC

## 2016-07-01 MED ORDER — INSULIN LISPRO 100 UNIT/ML (KWIKPEN): PEN_INJECTOR | SUBCUTANEOUS | 0 refills | Status: DC

## 2016-07-11 DIAGNOSIS — K644 Residual hemorrhoidal skin tags: Secondary | ICD-10-CM | POA: Diagnosis not present

## 2016-08-05 ENCOUNTER — Ambulatory Visit (INDEPENDENT_AMBULATORY_CARE_PROVIDER_SITE_OTHER): Payer: Medicare Other | Admitting: *Deleted

## 2016-08-05 DIAGNOSIS — Z23 Encounter for immunization: Secondary | ICD-10-CM

## 2016-08-29 ENCOUNTER — Other Ambulatory Visit: Payer: Self-pay | Admitting: *Deleted

## 2016-08-29 MED ORDER — INSULIN GLARGINE 100 UNIT/ML SOLOSTAR PEN: PEN_INJECTOR | SUBCUTANEOUS | 5 refills | Status: DC

## 2016-10-03 ENCOUNTER — Ambulatory Visit: Payer: Medicare Other | Admitting: Student in an Organized Health Care Education/Training Program

## 2016-10-20 NOTE — Progress Notes (Signed)
CC: Back Pain  HPI: Dana Coleman is a 76 y.o. female who presents to [facility] today for diabetes follow up, also notes back has bothered her for 1 week.  Diabetes - Previous A1c 8.8 in 05/2016 - A1c improved today at 7.9 - Denies palpitations, chest pain, abdominal pain, changes in urination, numbness or paresthesias, vision changes - endorses 100% compliance with current medication regimen  Back Pain - 1 weeks duration, right lower back pain - came on suddenly when bending down to get something - throbbing pain, does not radiate, worse with activity, better with rest and ibuprofen - no falls or trauma - patient does have history of cervical cancer, history of osteopenia - No fecal/urinary incontinence, no numbness or weakness, no fevers/chills/night sweats/unexplained weight loss  Ear Impaction, Ruptured tympanic membrane - patient noted to have ear wax on exam, complaints of recent impaction - upon washing of left ear patient complained of some pain, was noted to have left ear tympanic membrane rupture - endorses using bobby pins to clean out ears recently, has not had much pain, not sure when she injured ear - no recent fevers, exudate, ear pain, sore throat, or URI symptoms  Review of Symptoms:  See HPI for ROS.   CC, SH/smoking status, and VS noted.  Objective: BP 130/78   Pulse 62   Temp 98.1 F (36.7 C) (Oral)   Ht 5\' 2"  (1.575 m)   Wt 64.2 kg (141 lb 9.6 oz)   SpO2 97%   BMI 25.90 kg/m  GEN: NAD, alert, cooperative, and pleasant. EYE: no conjunctival injection, pupils equally round and reactive to light ENMT: Left ear +ruptured tympanic membrane without erythema or exudate, right ear +normal tympanic light reflex, no nasal polyps,no rhinorrhea, no pharyngeal erythema or exudates NECK: full ROM, no thyromegally RESPIRATORY: clear to auscultation bilaterally with no wheezes, rhonchi or rales, good effort CV: RRR, no m/r/g, no peripheral edema GI: soft,  non-tender, non-distended, normoactive bowel sounds, no hepatosplenomegaly SKIN: warm and dry, no rashes or lesions NEURO: II-XII grossly intact PSYCH: AAOx3, appropriate affect   Assessment and plan:  Insulin dependent type 2 diabetes mellitus, uncontrolled (HCC) - Improved A1c at 7.9 from 8.8 previously - continue current medication regimen at this time, follow up A1c in 3 months - Labs: will recheck fasting lipid panel for adherence monitoring at separate lab visit, can check chemistry at this time (last checked in 2016)  Back pain Right lower back pain of one weeks duration, sudden onset with bending, likely mild muscle spasm - No red flags, no focal neurologic deficits, no point tenderness - pain only lasting one week and without red flags, no indication for imaging at this time - will do ibuprofen, flexeril 5 mg  x5 pills for muscle spasm - return precautions provided   Ruptured tympanic membrane, left - patient endorses using bobby pins to clean out her ears - has some pain with ear cleaning, ruptured membrane subsequently visualized - no signs of infection at this time, no abx drops needed now - recommended using ear plug during showering for next 4 weeks - asked patient to f/u in 4 weeks to see improvement - patient education regarding ear cleaning provided   Orders Placed This Encounter  Procedures  . BASIC METABOLIC PANEL WITH GFR    Standing Status:   Future    Standing Expiration Date:   10/21/2017  . Lipid panel    Standing Status:   Future    Standing Expiration  Date:   10/21/2017  . HgB A1c    Meds ordered this encounter  Medications  . cyclobenzaprine (FLEXERIL) 5 MG tablet    Sig: Take 1 tablet (5 mg total) by mouth at bedtime as needed for muscle spasms.    Dispense:  5 tablet    Refill:  0     Everrett Coombe, MD,MS,  PGY1 10/21/2016 7:33 PM

## 2016-10-21 ENCOUNTER — Encounter: Payer: Self-pay | Admitting: Student in an Organized Health Care Education/Training Program

## 2016-10-21 ENCOUNTER — Ambulatory Visit (INDEPENDENT_AMBULATORY_CARE_PROVIDER_SITE_OTHER): Payer: Medicare Other | Admitting: Student in an Organized Health Care Education/Training Program

## 2016-10-21 VITALS — BP 130/78 | HR 62 | Temp 98.1°F | Ht 62.0 in | Wt 141.6 lb

## 2016-10-21 DIAGNOSIS — H7292 Unspecified perforation of tympanic membrane, left ear: Secondary | ICD-10-CM

## 2016-10-21 DIAGNOSIS — Z794 Long term (current) use of insulin: Secondary | ICD-10-CM

## 2016-10-21 DIAGNOSIS — M545 Low back pain, unspecified: Secondary | ICD-10-CM

## 2016-10-21 DIAGNOSIS — E1165 Type 2 diabetes mellitus with hyperglycemia: Secondary | ICD-10-CM

## 2016-10-21 DIAGNOSIS — IMO0002 Reserved for concepts with insufficient information to code with codable children: Secondary | ICD-10-CM

## 2016-10-21 LAB — POCT GLYCOSYLATED HEMOGLOBIN (HGB A1C): Hemoglobin A1C: 7.9

## 2016-10-21 MED ORDER — CYCLOBENZAPRINE HCL 5 MG PO TABS: 5.0000 mg | ORAL_TABLET | Freq: Every evening | ORAL | 0 refills | Status: DC | PRN

## 2016-10-21 NOTE — Patient Instructions (Addendum)
It was a pleasure seeing you today in our clinic. Today we discussed diabetes and back pain. Here is the treatment plan we have discussed and agreed upon together:  Diabetes - Your diabetes is improving, your HbA1c was 7.9 this visit.  We will continue the current medications without change in your regimen.  Back Pain  - This may be due to a muscle spasm in your back - Please continue to move around, and I would not recommend wearing the back brace.   - You can use ibuprofen as needed for back pain, also flexeril as needed - I would expect the back pain to improve over the next few weeks. If the pain worsens or does not improve, please call us back or follow up to be seen again.  Our clinic's number is [phone]. Please call with questions or concerns about what we discussed today.  Be well, Dr. Burr Medico

## 2016-10-21 NOTE — Assessment & Plan Note (Signed)
-   Improved A1c at 7.9 from 8.8 previously - continue current medication regimen at this time, follow up A1c in 3 months - Labs: will recheck fasting lipid panel for adherence monitoring at separate lab visit, can check chemistry at this time (last checked in 2016)

## 2016-10-21 NOTE — Assessment & Plan Note (Addendum)
-   patient endorses using bobby pins to clean out her ears - has some pain with ear cleaning, ruptured membrane subsequently visualized - no signs of infection at this time, no abx drops needed now - recommended using ear plug during showering for next 4 weeks - asked patient to f/u in 4 weeks to see improvement - patient education regarding ear cleaning provided

## 2016-10-21 NOTE — Assessment & Plan Note (Signed)
Right lower back pain of one weeks duration, sudden onset with bending, likely mild muscle spasm - No red flags, no focal neurologic deficits, no point tenderness - pain only lasting one week and without red flags, no indication for imaging at this time - will do ibuprofen, flexeril 5 mg  x5 pills for muscle spasm - return precautions provided

## 2016-10-24 ENCOUNTER — Other Ambulatory Visit: Payer: Medicare Other

## 2016-10-24 ENCOUNTER — Other Ambulatory Visit: Payer: Self-pay | Admitting: Family Medicine

## 2016-10-24 DIAGNOSIS — IMO0002 Reserved for concepts with insufficient information to code with codable children: Secondary | ICD-10-CM

## 2016-10-24 DIAGNOSIS — Z794 Long term (current) use of insulin: Principal | ICD-10-CM

## 2016-10-24 DIAGNOSIS — E1165 Type 2 diabetes mellitus with hyperglycemia: Secondary | ICD-10-CM | POA: Diagnosis not present

## 2016-10-24 NOTE — Progress Notes (Signed)
Patient with [name] Medicare, she was sent to [place] to have her bloodwork done today. Busick, Kevin Fenton

## 2016-11-04 DIAGNOSIS — E785 Hyperlipidemia, unspecified: Secondary | ICD-10-CM | POA: Diagnosis not present

## 2016-11-04 DIAGNOSIS — I2510 Atherosclerotic heart disease of native coronary artery without angina pectoris: Secondary | ICD-10-CM | POA: Diagnosis not present

## 2016-11-04 DIAGNOSIS — I10 Essential (primary) hypertension: Secondary | ICD-10-CM | POA: Diagnosis not present

## 2016-11-04 DIAGNOSIS — E119 Type 2 diabetes mellitus without complications: Secondary | ICD-10-CM | POA: Diagnosis not present

## 2016-11-17 ENCOUNTER — Encounter: Payer: Self-pay | Admitting: Student in an Organized Health Care Education/Training Program

## 2016-12-11 LAB — BASIC METABOLIC PANEL
BUN / CREAT RATIO: 13 (ref 12–28)
BUN: 12 mg/dL (ref 8–27)
CO2: 27 mmol/L (ref 18–29)
CREATININE: 0.94 mg/dL (ref 0.57–1.00)
Calcium: 9.7 mg/dL (ref 8.7–10.3)
Chloride: 101 mmol/L (ref 96–106)
GFR calc Af Amer: 69 mL/min/{1.73_m2} (ref >59)
GFR, EST NON AFRICAN AMERICAN: 60 mL/min/{1.73_m2} (ref >59)
GLUCOSE: 146 mg/dL — AB (ref 65–99)
Potassium: 5 mmol/L (ref 3.5–5.2)
SODIUM: 145 mmol/L — AB (ref 134–144)

## 2016-12-11 LAB — LIPID PANEL
CHOL/HDL RATIO: 2.4 ratio (ref 0.0–4.4)
Cholesterol, Total: 142 mg/dL (ref 100–199)
HDL: 60 mg/dL (ref >39)
LDL Calculated: 69 mg/dL (ref 0–99)
Triglycerides: 64 mg/dL (ref 0–149)
VLDL Cholesterol Cal: 13 mg/dL (ref 5–40)

## 2016-12-11 LAB — PLEASE NOTE

## 2016-12-12 ENCOUNTER — Encounter: Payer: Self-pay | Admitting: Student in an Organized Health Care Education/Training Program

## 2017-01-20 DIAGNOSIS — H2511 Age-related nuclear cataract, right eye: Secondary | ICD-10-CM | POA: Diagnosis not present

## 2017-01-20 DIAGNOSIS — H52203 Unspecified astigmatism, bilateral: Secondary | ICD-10-CM | POA: Diagnosis not present

## 2017-01-20 DIAGNOSIS — E119 Type 2 diabetes mellitus without complications: Secondary | ICD-10-CM | POA: Diagnosis not present

## 2017-01-20 LAB — HM DIABETES EYE EXAM

## 2017-02-02 ENCOUNTER — Encounter: Payer: Self-pay | Admitting: Student in an Organized Health Care Education/Training Program

## 2017-02-13 ENCOUNTER — Other Ambulatory Visit: Payer: Self-pay | Admitting: *Deleted

## 2017-02-13 MED ORDER — OLMESARTAN MEDOXOMIL-HCTZ 40-12.5 MG PO TABS: 1.0000 | ORAL_TABLET | Freq: Every day | ORAL | 3 refills | Status: DC

## 2017-02-26 ENCOUNTER — Other Ambulatory Visit: Payer: Self-pay | Admitting: Student in an Organized Health Care Education/Training Program

## 2017-02-26 MED ORDER — OLMESARTAN MEDOXOMIL-HCTZ 40-12.5 MG PO TABS: 1.0000 | ORAL_TABLET | Freq: Every day | ORAL | 3 refills | Status: DC

## 2017-02-26 NOTE — Telephone Encounter (Signed)
Patient came to office needs refill on RX Olmesartan-hctz. Patient stated she has been out since Sunday[facility] [address] Please let patient know when done [phone]

## 2017-03-02 ENCOUNTER — Other Ambulatory Visit: Payer: Self-pay | Admitting: *Deleted

## 2017-03-02 DIAGNOSIS — E1165 Type 2 diabetes mellitus with hyperglycemia: Secondary | ICD-10-CM

## 2017-03-02 DIAGNOSIS — IMO0002 Reserved for concepts with insufficient information to code with codable children: Secondary | ICD-10-CM

## 2017-03-02 DIAGNOSIS — Z794 Long term (current) use of insulin: Principal | ICD-10-CM

## 2017-03-02 MED ORDER — METFORMIN HCL 500 MG PO TABS: 500.0000 mg | ORAL_TABLET | Freq: Three times a day (TID) | ORAL | 3 refills | Status: DC

## 2017-04-03 ENCOUNTER — Other Ambulatory Visit: Payer: Self-pay | Admitting: *Deleted

## 2017-04-03 DIAGNOSIS — IMO0002 Reserved for concepts with insufficient information to code with codable children: Secondary | ICD-10-CM

## 2017-04-03 DIAGNOSIS — E1165 Type 2 diabetes mellitus with hyperglycemia: Secondary | ICD-10-CM

## 2017-04-03 DIAGNOSIS — Z794 Long term (current) use of insulin: Principal | ICD-10-CM

## 2017-04-04 MED ORDER — INSULIN PEN NEEDLE 31G X 5 MM MISC: 3 refills | Status: DC

## 2017-04-07 ENCOUNTER — Ambulatory Visit (INDEPENDENT_AMBULATORY_CARE_PROVIDER_SITE_OTHER): Payer: Medicare Other | Admitting: Family Medicine

## 2017-04-07 ENCOUNTER — Encounter: Payer: Self-pay | Admitting: Family Medicine

## 2017-04-07 VITALS — BP 126/70 | HR 61 | Temp 97.8°F | Ht 62.0 in | Wt 140.0 lb

## 2017-04-07 DIAGNOSIS — Z794 Long term (current) use of insulin: Secondary | ICD-10-CM

## 2017-04-07 DIAGNOSIS — E1165 Type 2 diabetes mellitus with hyperglycemia: Secondary | ICD-10-CM

## 2017-04-07 DIAGNOSIS — H938X1 Other specified disorders of right ear: Secondary | ICD-10-CM

## 2017-04-07 DIAGNOSIS — IMO0002 Reserved for concepts with insufficient information to code with codable children: Secondary | ICD-10-CM

## 2017-04-07 LAB — POCT GLYCOSYLATED HEMOGLOBIN (HGB A1C): Hemoglobin A1C: 8.8

## 2017-04-07 MED ORDER — METFORMIN HCL 500 MG PO TABS: 1000.0000 mg | ORAL_TABLET | Freq: Two times a day (BID) | ORAL | 3 refills | Status: DC

## 2017-04-07 NOTE — Assessment & Plan Note (Signed)
Not controlled.  A1c worsened since last visit.  I think that this is due to medication compliance/ tolerance.  She was previously on Metformin 500mg  TID but was only taking once daily.  I recommended that she take 1000mg  BID.  Hopefully, we can at least get 1000mg  once daily in.  If she is unable to tolerate regimen, she will come in sooner to have insulin titrated.  DM foot exam performed.  Follow up in 3 months or sooner w/ PCP for DM2

## 2017-04-07 NOTE — Patient Instructions (Signed)
As we discussed, increase your metformin to 2 tablets (1000mg  total) 1 time daily x1 week. Then increase to 2 tablets (1000mg ) 2 times daily.  Follow up with Dr Burr Medico in 3 months for Diabetes or sooner if you are unable to tolerate the Metformin increase.  Bring your glucometer or blood sugar log to your next appointment.   Diabetes Mellitus and Exercise Exercising regularly is important for your overall health, especially when you have diabetes (diabetes mellitus). Exercising is not only about losing weight. It has many health benefits, such as increasing muscle strength and bone density and reducing body fat and stress. This leads to improved fitness, flexibility, and endurance, all of which result in better overall health. Exercise has additional benefits for people with diabetes, including:  Reducing appetite.  Helping to lower and control blood glucose.  Lowering blood pressure.  Helping to control amounts of fatty substances (lipids) in the blood, such as cholesterol and triglycerides.  Helping the body to respond better to insulin (improving insulin sensitivity).  Reducing how much insulin the body needs.  Decreasing the risk for heart disease by: ? Lowering cholesterol and triglyceride levels. ? Increasing the levels of good cholesterol. ? Lowering blood glucose levels.  What is my activity plan? Your health care provider or certified diabetes educator can help you make a plan for the type and frequency of exercise (activity plan) that works for you. Make sure that you:  Do at least 150 minutes of moderate-intensity or vigorous-intensity exercise each week. This could be brisk walking, biking, or water aerobics. ? Do stretching and strength exercises, such as yoga or weightlifting, at least 2 times a week. ? Spread out your activity over at least 3 days of the week.  Get some form of physical activity every day. ? Do not go more than 2 days in a row without some kind of  physical activity. ? Avoid being inactive for more than 90 minutes at a time. Take frequent breaks to walk or stretch.  Choose a type of exercise or activity that you enjoy, and set realistic goals.  Start slowly, and gradually increase the intensity of your exercise over time.  What do I need to know about managing my diabetes?  Check your blood glucose before and after exercising. ? If your blood glucose is higher than 240 mg/dL (13.3 mmol/L) before you exercise, check your urine for ketones. If you have ketones in your urine, do not exercise until your blood glucose returns to normal.  Know the symptoms of low blood glucose (hypoglycemia) and how to treat it. Your risk for hypoglycemia increases during and after exercise. Common symptoms of hypoglycemia can include: ? Hunger. ? Anxiety. ? Sweating and feeling clammy. ? Confusion. ? Dizziness or feeling light-headed. ? Increased heart rate or palpitations. ? Blurry vision. ? Tingling or numbness around the mouth, lips, or tongue. ? Tremors or shakes. ? Irritability.  Keep a rapid-acting carbohydrate snack available before, during, and after exercise to help prevent or treat hypoglycemia.  Avoid injecting insulin into areas of the body that are going to be exercised. For example, avoid injecting insulin into: ? The arms, when playing tennis. ? The legs, when jogging.  Keep records of your exercise habits. Doing this can help you and your health care provider adjust your diabetes management plan as needed. Write down: ? Food that you eat before and after you exercise. ? Blood glucose levels before and after you exercise. ? The type and amount of  exercise you have done. ? When your insulin is expected to peak, if you use insulin. Avoid exercising at times when your insulin is peaking.  When you start a new exercise or activity, work with your health care provider to make sure the activity is safe for you, and to adjust your  insulin, medicines, or food intake as needed.  Drink plenty of water while you exercise to prevent dehydration or heat stroke. Drink enough fluid to keep your urine clear or pale yellow. This information is not intended to replace advice given to you by your health care provider. Make sure you discuss any questions you have with your health care provider. Document Released: 12/20/2003 Document Revised: 04/18/2016 Document Reviewed: 03/10/2016 Elsevier Interactive Patient Education  2018 [employer].

## 2017-04-07 NOTE — Progress Notes (Signed)
Subjective: CC:DM2 HPI: Dana Coleman is a 76 y.o. female presenting to clinic today for:  1. Diabetes:  High at home: 159 Low at home: 101 Taking medications: Lantus 30 u qam, Humalog 6u q supper.  Metformin 500mg  qam (makes her sick at night time) ROS: denies fever, chills, dizziness, LOC, polyuria, polydipsia, numbness or tingling in extremities or chest pain. Last eye exam: 01/2017 Last A1c: 7.9 (10/2016) Nephropathy screen indicated?: benicar 40/12.5  2. Right ear irritation Patient reports several weeks duration of right ear irritation.  She denies hearing loss, headache, fevers, ear drainage.  No cough, congestion, sinus pressure.  She does not use qtips or other foreign bodies.  Endorses itching.  Social Hx reviewed: non smoker. MedHx, medications and allergies reviewed.  Please see EMR.  ROS: Per HPI  Objective: Office vital signs reviewed. BP 126/70   Pulse 61   Temp 97.8 F (36.6 C) (Oral)   Ht 5\' 2"  (1.575 m)   Wt 140 lb (63.5 kg)   SpO2 98%   BMI 25.61 kg/m   Physical Examination:  General: Awake, alert, well nourished, No acute distress HEENT: Normal, MMM Ears: normal externally, TMs in tact bilaterally, no bulging.  Scant cerumen in canal.  Cardio: regular rate and rhythm, S1S2 heard, no murmurs appreciated Pulm: clear to auscultation bilaterally, no wheezes, rhonchi or rales; normal work of breathing on room air Extremities: warm, well perfused, No edema, cyanosis or clubbing; +2 pulses bilaterally  Results for orders placed or performed in visit on 04/07/17 (from the past 24 hour(s))  HgB A1c     Status: Abnormal   Collection Time: 04/07/17 10:20 AM  Result Value Ref Range   Hemoglobin A1C 8.8    Diabetic Foot Form - Detailed   Diabetic Foot Exam - detailed Diabetic Foot exam was performed with the following findings:  Yes 04/07/2017 11:56 AM  Visual Foot Exam completed.:  Yes  Can the patient see the bottom of their feet?:  Yes Are the  shoes appropriate in style and fit?:  Yes Is there swelling or and abnormal foot shape?:  No Is there a claw toe deformity?:  No Is there elevated skin temparature?:  No Is there foot or ankle muscle weakness?:  No Normal Range of Motion:  No (Comment: limited dorsiflexion) Pulse Foot Exam completed.:  Yes  Right posterior Tibialias:  Present Left posterior Tibialias:  Present  Right Dorsalis Pedis:  Present Left Dorsalis Pedis:  Present  Semmes-Weinstein Monofilament Test R Site 1-Great Toe:  Pos L Site 1-Great Toe:  Pos    Comments:  Small callous on the left heel. No skin breakdown, fluctuance or erythema.    Assessment/ Plan: 76 y.o. female   Insulin dependent type 2 diabetes mellitus, uncontrolled ([place]) Not controlled.  A1c worsened since last visit.  I think that this is due to medication compliance/ tolerance.  She was previously on Metformin 500mg  TID but was only taking once daily.  I recommended that she take 1000mg  BID.  Hopefully, we can at least get 1000mg  once daily in.  If she is unable to tolerate regimen, she will come in sooner to have insulin titrated.  DM foot exam performed.  Follow up in 3 months or sooner w/ PCP for DM2  Irritation of right ear No evidence of AOM or otitis externa.  Normal exam.  No hearing loss or red flag signs.  Reassurance.    Janora Norlander, DO PGY-3, [facility]

## 2017-04-16 ENCOUNTER — Telehealth: Payer: Self-pay | Admitting: Student in an Organized Health Care Education/Training Program

## 2017-04-16 ENCOUNTER — Other Ambulatory Visit: Payer: Self-pay | Admitting: Student in an Organized Health Care Education/Training Program

## 2017-04-16 NOTE — Telephone Encounter (Signed)
She needs a prescription sent [facility] [address] for a diabetes shot (Novolog).  Please let her know when it is called in, thanks.  [phone]

## 2017-04-17 ENCOUNTER — Other Ambulatory Visit: Payer: Self-pay | Admitting: Family Medicine

## 2017-04-17 DIAGNOSIS — Z1231 Encounter for screening mammogram for malignant neoplasm of breast: Secondary | ICD-10-CM

## 2017-04-18 ENCOUNTER — Other Ambulatory Visit: Payer: Self-pay | Admitting: Student in an Organized Health Care Education/Training Program

## 2017-04-18 MED ORDER — INSULIN LISPRO 100 UNIT/ML (KWIKPEN): PEN_INJECTOR | SUBCUTANEOUS | 0 refills | Status: DC

## 2017-04-18 NOTE — Telephone Encounter (Signed)
Please call and let patient humalog was refilled at [facility] [address]

## 2017-04-20 NOTE — Telephone Encounter (Signed)
Pt informed. Chinaza Rooke T Kash [place], CMA  

## 2017-06-02 ENCOUNTER — Ambulatory Visit
Admission: RE | Admit: 2017-06-02 | Discharge: 2017-06-02 | Disposition: A | Discharge status: Home / Self Care | Payer: Medicare Other | Source: Ambulatory Visit | Attending: Family Medicine | Admitting: Family Medicine

## 2017-06-02 DIAGNOSIS — Z1231 Encounter for screening mammogram for malignant neoplasm of breast: Secondary | ICD-10-CM

## 2017-06-02 HISTORY — DX: Personal history of irradiation: Z92.3

## 2017-07-13 ENCOUNTER — Encounter: Payer: Self-pay | Admitting: *Deleted

## 2017-07-14 ENCOUNTER — Ambulatory Visit (INDEPENDENT_AMBULATORY_CARE_PROVIDER_SITE_OTHER): Payer: Medicare Other | Admitting: *Deleted

## 2017-07-14 ENCOUNTER — Encounter: Payer: Self-pay | Admitting: *Deleted

## 2017-07-14 VITALS — BP 104/56 | HR 64 | Temp 98.4°F | Ht 62.0 in | Wt 135.6 lb

## 2017-07-14 DIAGNOSIS — IMO0002 Reserved for concepts with insufficient information to code with codable children: Secondary | ICD-10-CM

## 2017-07-14 DIAGNOSIS — E1165 Type 2 diabetes mellitus with hyperglycemia: Secondary | ICD-10-CM | POA: Diagnosis not present

## 2017-07-14 DIAGNOSIS — Z23 Encounter for immunization: Secondary | ICD-10-CM

## 2017-07-14 DIAGNOSIS — Z794 Long term (current) use of insulin: Secondary | ICD-10-CM

## 2017-07-14 DIAGNOSIS — Z0000 Encounter for general adult medical examination without abnormal findings: Secondary | ICD-10-CM

## 2017-07-14 LAB — POCT GLYCOSYLATED HEMOGLOBIN (HGB A1C): Hemoglobin A1C: 8.7

## 2017-07-14 NOTE — Patient Instructions (Addendum)
Dana Coleman,  Thank you for taking time to come for yourMedicare Wellness Visit. I appreciate your ongoing commitment to your health goals. Please review the following plan we discussed and let me know if I can assist you in the future.   These are the goals we discussed:  Goals    . Increase water intake          Infuse with fruit or vegetable    . Weight < 135 lb (61.236 kg)          Goal Weigh of 135 lbs         Diabetes and Foot Care Diabetes may cause you to have problems because of poor blood supply (circulation) to your feet and legs. This may cause the skin on your feet to become thinner, break easier, and heal more slowly. Your skin may become dry, and the skin may peel and crack. You may also have nerve damage in your legs and feet causing decreased feeling in them. You may not notice minor injuries to your feet that could lead to infections or more serious problems. Taking care of your feet is one of the most important things you can do for yourself. Follow these instructions at home:  Wear shoes at all times, even in the house. Do not go barefoot. Bare feet are easily injured.  Check your feet daily for blisters, cuts, and redness. If you cannot see the bottom of your feet, use a mirror or ask someone for help.  Wash your feet with warm water (do not use hot water) and mild soap. Then pat your feet and the areas between your toes until they are completely dry. Do not soak your feet as this can dry your skin.  Apply a moisturizing lotion or petroleum jelly (that does not contain alcohol and is unscented) to the skin on your feet and to dry, brittle toenails. Do not apply lotion between your toes.  Trim your toenails straight across. Do not dig under them or around the cuticle. File the edges of your nails with an emery board or nail file.  Do not cut corns or calluses or try to remove them with medicine.  Wear clean socks or stockings every day. Make sure they are not  too tight. Do not wear knee-high stockings since they may decrease blood flow to your legs.  Wear shoes that fit properly and have enough cushioning. To break in new shoes, wear them for just a few hours a day. This prevents you from injuring your feet. Always look in your shoes before you put them on to be sure there are no objects inside.  Do not cross your legs. This may decrease the blood flow to your feet.  If you find a minor scrape, cut, or break in the skin on your feet, keep it and the skin around it clean and dry. These areas may be cleansed with mild soap and water. Do not cleanse the area with peroxide, alcohol, or iodine.  When you remove an adhesive bandage, be sure not to damage the skin around it.  If you have a wound, look at it several times a day to make sure it is healing.  Do not use heating pads or hot water bottles. They may burn your skin. If you have lost feeling in your feet or legs, you may not know it is happening until it is too late.  Make sure your health care provider performs a complete foot exam at  least annually or more often if you have foot problems. Report any cuts, sores, or bruises to your health care provider immediately. Contact a health care provider if:  You have an injury that is not healing.  You have cuts or breaks in the skin.  You have an ingrown nail.  You notice redness on your legs or feet.  You feel burning or tingling in your legs or feet.  You have pain or cramps in your legs and feet.  Your legs or feet are numb.  Your feet always feel cold. Get help right away if:  There is increasing redness, swelling, or pain in or around a wound.  There is a red line that goes up your leg.  Pus is coming from a wound.  You develop a fever or as directed by your health care provider.  You notice a bad smell coming from an ulcer or wound. This information is not intended to replace advice given to you by your health care provider.  Make sure you discuss any questions you have with your health care provider. Document Released: 09/26/2000 Document Revised: 03/06/2016 Document Reviewed: 03/08/2013 Elsevier Interactive Patient Education  2017 [place] Prevention in the Home Falls can cause injuries. They can happen to people of all ages. There are many things you can do to make your home safe and to help prevent falls. What can I do on the outside of my home?  Regularly fix the edges of walkways and driveways and fix any cracks.  Remove anything that might make you trip as you walk through a door, such as a raised step or threshold.  Trim any bushes or trees on the path to your home.  Use bright outdoor lighting.  Clear any walking paths of anything that might make someone trip, such as rocks or tools.  Regularly check to see if handrails are loose or broken. Make sure that both sides of any steps have handrails.  Any raised decks and porches should have guardrails on the edges.  Have any leaves, snow, or ice cleared regularly.  Use sand or salt on walking paths during winter.  Clean up any spills in your garage right away. This includes oil or grease spills. What can I do in the bathroom?  Use night lights.  Install grab bars by the toilet and in the tub and shower. Do not use towel bars as grab bars.  Use non-skid mats or decals in the tub or shower.  If you need to sit down in the shower, use a plastic, non-slip stool.  Keep the floor dry. Clean up any water that spills on the floor as soon as it happens.  Remove soap buildup in the tub or shower regularly.  Attach bath mats securely with double-sided non-slip rug tape.  Do not have throw rugs and other things on the floor that can make you trip. What can I do in the bedroom?  Use night lights.  Make sure that you have a light by your bed that is easy to reach.  Do not use any sheets or blankets that are too big for your bed. They  should not hang down onto the floor.  Have a firm chair that has side arms. You can use this for support while you get dressed.  Do not have throw rugs and other things on the floor that can make you trip. What can I do in the kitchen?  Clean up any spills right  away.  Avoid walking on wet floors.  Keep items that you use a lot in easy-to-reach places.  If you need to reach something above you, use a strong step stool that has a grab bar.  Keep electrical cords out of the way.  Do not use floor polish or wax that makes floors slippery. If you must use wax, use non-skid floor wax.  Do not have throw rugs and other things on the floor that can make you trip. What can I do with my stairs?  Do not leave any items on the stairs.  Make sure that there are handrails on both sides of the stairs and use them. Fix handrails that are broken or loose. Make sure that handrails are as long as the stairways.  Check any carpeting to make sure that it is firmly attached to the stairs. Fix any carpet that is loose or worn.  Avoid having throw rugs at the top or bottom of the stairs. If you do have throw rugs, attach them to the floor with carpet tape.  Make sure that you have a light switch at the top of the stairs and the bottom of the stairs. If you do not have them, ask someone to add them for you. What else can I do to help prevent falls?  Wear shoes that: ? Do not have high heels. ? Have rubber bottoms. ? Are comfortable and fit you well. ? Are closed at the toe. Do not wear sandals.  If you use a stepladder: ? Make sure that it is fully opened. Do not climb a closed stepladder. ? Make sure that both sides of the stepladder are locked into place. ? Ask someone to hold it for you, if possible.  Clearly mark and make sure that you can see: ? Any grab bars or handrails. ? First and last steps. ? Where the edge of each step is.  Use tools that help you move around (mobility aids) if  they are needed. These include: ? Canes. ? Walkers. ? Scooters. ? Crutches.  Turn on the lights when you go into a dark area. Replace any light bulbs as soon as they burn out.  Set up your furniture so you have a clear path. Avoid moving your furniture around.  If any of your floors are uneven, fix them.  If there are any pets around you, be aware of where they are.  Review your medicines with your doctor. Some medicines can make you feel dizzy. This can increase your chance of falling. Ask your doctor what other things that you can do to help prevent falls. This information is not intended to replace advice given to you by your health care provider. Make sure you discuss any questions you have with your health care provider. Document Released: 07/26/2009 Document Revised: 03/06/2016 Document Reviewed: 11/03/2014 Elsevier Interactive Patient Education  2018 [place] Maintenance, Female Adopting a healthy lifestyle and getting preventive care can go a long way to promote health and wellness. Talk with your health care provider about what schedule of regular examinations is right for you. This is a good chance for you to check in with your provider about disease prevention and staying healthy. In between checkups, there are plenty of things you can do on your own. Experts have done a lot of research about which lifestyle changes and preventive measures are most likely to keep you healthy. Ask your health care provider for more information. Weight and diet Eat  a healthy diet  Be sure to include plenty of vegetables, fruits, low-fat dairy products, and lean protein.  Do not eat a lot of foods high in solid fats, added sugars, or salt.  Get regular exercise. This is one of the most important things you can do for your health. ? Most adults should exercise for at least 150 minutes each week. The exercise should increase your heart rate and make you sweat (moderate-intensity  exercise). ? Most adults should also do strengthening exercises at least twice a week. This is in addition to the moderate-intensity exercise.  Maintain a healthy weight  Body mass index (BMI) is a measurement that can be used to identify possible weight problems. It estimates body fat based on height and weight. Your health care provider can help determine your BMI and help you achieve or maintain a healthy weight.  For females 51 years of age and older: ? A BMI below 18.5 is considered underweight. ? A BMI of 18.5 to 24.9 is normal. ? A BMI of 25 to 29.9 is considered overweight. ? A BMI of 30 and above is considered obese.  Watch levels of cholesterol and blood lipids  You should start having your blood tested for lipids and cholesterol at 75 years of age, then have this test every 5 years.  You may need to have your cholesterol levels checked more often if: ? Your lipid or cholesterol levels are high. ? You are older than 75 years of age. ? You are at high risk for heart disease.  Cancer screening Lung Cancer  Lung cancer screening is recommended for adults 8-17 years old who are at high risk for lung cancer because of a history of smoking.  A yearly low-dose CT scan of the lungs is recommended for people who: ? Currently smoke. ? Have quit within the past 15 years. ? Have at least a 30-pack-year history of smoking. A pack year is smoking an average of one pack of cigarettes a day for 1 year.  Yearly screening should continue until it has been 15 years since you quit.  Yearly screening should stop if you develop a health problem that would prevent you from having lung cancer treatment.  Breast Cancer  Practice breast self-awareness. This means understanding how your breasts normally appear and feel.  It also means doing regular breast self-exams. Let your health care provider know about any changes, no matter how small.  If you are in your 20s or 30s, you should have a  clinical breast exam (CBE) by a health care provider every 1-3 years as part of a regular health exam.  If you are 81 or older, have a CBE every year. Also consider having a breast X-ray (mammogram) every year.  If you have a family history of breast cancer, talk to your health care provider about genetic screening.  If you are at high risk for breast cancer, talk to your health care provider about having an MRI and a mammogram every year.  Breast cancer gene (BRCA) assessment is recommended for women who have family members with BRCA-related cancers. BRCA-related cancers include: ? Breast. ? Ovarian. ? Tubal. ? Peritoneal cancers.  Results of the assessment will determine the need for genetic counseling and BRCA1 and BRCA2 testing.  Cervical Cancer Your health care provider may recommend that you be screened regularly for cancer of the pelvic organs (ovaries, uterus, and vagina). This screening involves a pelvic examination, including checking for microscopic changes to the surface  of your cervix (Pap test). You may be encouraged to have this screening done every 3 years, beginning at age 54.  For women ages 47-65, health care providers may recommend pelvic exams and Pap testing every 3 years, or they may recommend the Pap and pelvic exam, combined with testing for human papilloma virus (HPV), every 5 years. Some types of HPV increase your risk of cervical cancer. Testing for HPV may also be done on women of any age with unclear Pap test results.  Other health care providers may not recommend any screening for nonpregnant women who are considered low risk for pelvic cancer and who do not have symptoms. Ask your health care provider if a screening pelvic exam is right for you.  If you have had past treatment for cervical cancer or a condition that could lead to cancer, you need Pap tests and screening for cancer for at least 20 years after your treatment. If Pap tests have been discontinued,  your risk factors (such as having a new sexual partner) need to be reassessed to determine if screening should resume. Some women have medical problems that increase the chance of getting cervical cancer. In these cases, your health care provider may recommend more frequent screening and Pap tests.  Colorectal Cancer  This type of cancer can be detected and often prevented.  Routine colorectal cancer screening usually begins at 75 years of age and continues through 75 years of age.  Your health care provider may recommend screening at an earlier age if you have risk factors for colon cancer.  Your health care provider may also recommend using home test kits to check for hidden blood in the stool.  A small camera at the end of a tube can be used to examine your colon directly (sigmoidoscopy or colonoscopy). This is done to check for the earliest forms of colorectal cancer.  Routine screening usually begins at age 21.  Direct examination of the colon should be repeated every 5-10 years through 75 years of age. However, you may need to be screened more often if early forms of precancerous polyps or small growths are found.  Skin Cancer  Check your skin from head to toe regularly.  Tell your health care provider about any new moles or changes in moles, especially if there is a change in a mole's shape or color.  Also tell your health care provider if you have a mole that is larger than the size of a pencil eraser.  Always use sunscreen. Apply sunscreen liberally and repeatedly throughout the day.  Protect yourself by wearing long sleeves, pants, a wide-brimmed hat, and sunglasses whenever you are outside.  Heart disease, diabetes, and high blood pressure  High blood pressure causes heart disease and increases the risk of stroke. High blood pressure is more likely to develop in: ? People who have blood pressure in the high end of the normal range (130-139/85-89 mm Hg). ? People who are  overweight or obese. ? People who are African American.  If you are 51-26 years of age, have your blood pressure checked every 3-5 years. If you are 70 years of age or older, have your blood pressure checked every year. You should have your blood pressure measured twice-once when you are at a hospital or clinic, and once when you are not at a hospital or clinic. Record the average of the two measurements. To check your blood pressure when you are not at a hospital or clinic, you can use: ?  An automated blood pressure machine at a pharmacy. ? A home blood pressure monitor.  If you are between 18 years and 27 years old, ask your health care provider if you should take aspirin to prevent strokes.  Have regular diabetes screenings. This involves taking a blood sample to check your fasting blood sugar level. ? If you are at a normal weight and have a low risk for diabetes, have this test once every three years after 75 years of age. ? If you are overweight and have a high risk for diabetes, consider being tested at a younger age or more often. Preventing infection Hepatitis B  If you have a higher risk for hepatitis B, you should be screened for this virus. You are considered at high risk for hepatitis B if: ? You were born in a country where hepatitis B is common. Ask your health care provider which countries are considered high risk. ? Your parents were born in a high-risk country, and you have not been immunized against hepatitis B (hepatitis B vaccine). ? You have HIV or AIDS. ? You use needles to inject street drugs. ? You live with someone who has hepatitis B. ? You have had sex with someone who has hepatitis B. ? You get hemodialysis treatment. ? You take certain medicines for conditions, including cancer, organ transplantation, and autoimmune conditions.  Hepatitis C  Blood testing is recommended for: ? Everyone born from [birth year] through 1964. ? Anyone with known risk factors for  hepatitis C.  Sexually transmitted infections (STIs)  You should be screened for sexually transmitted infections (STIs) including gonorrhea and chlamydia if: ? You are sexually active and are younger than 75 years of age. ? You are older than 75 years of age and your health care provider tells you that you are at risk for this type of infection. ? Your sexual activity has changed since you were last screened and you are at an increased risk for chlamydia or gonorrhea. Ask your health care provider if you are at risk.  If you do not have HIV, but are at risk, it may be recommended that you take a prescription medicine daily to prevent HIV infection. This is called pre-exposure prophylaxis (PrEP). You are considered at risk if: ? You are sexually active and do not regularly use condoms or know the HIV status of your partner(s). ? You take drugs by injection. ? You are sexually active with a partner who has HIV.  Talk with your health care provider about whether you are at high risk of being infected with HIV. If you choose to begin PrEP, you should first be tested for HIV. You should then be tested every 3 months for as long as you are taking PrEP. Pregnancy  If you are premenopausal and you may become pregnant, ask your health care provider about preconception counseling.  If you may become pregnant, take 400 to 800 micrograms (mcg) of folic acid every day.  If you want to prevent pregnancy, talk to your health care provider about birth control (contraception). Osteoporosis and menopause  Osteoporosis is a disease in which the bones lose minerals and strength with aging. This can result in serious bone fractures. Your risk for osteoporosis can be identified using a bone density scan.  If you are 66 years of age or older, or if you are at risk for osteoporosis and fractures, ask your health care provider if you should be screened.  Ask your health care provider whether you  should take a  calcium or vitamin D supplement to lower your risk for osteoporosis.  Menopause may have certain physical symptoms and risks.  Hormone replacement therapy may reduce some of these symptoms and risks. Talk to your health care provider about whether hormone replacement therapy is right for you. Follow these instructions at home:  Schedule regular health, dental, and eye exams.  Stay current with your immunizations.  Do not use any tobacco products including cigarettes, chewing tobacco, or electronic cigarettes.  If you are pregnant, do not drink alcohol.  If you are breastfeeding, limit how much and how often you drink alcohol.  Limit alcohol intake to no more than 1 drink per day for nonpregnant women. One drink equals 12 ounces of beer, 5 ounces of wine, or 1 ounces of hard liquor.  Do not use street drugs.  Do not share needles.  Ask your health care provider for help if you need support or information about quitting drugs.  Tell your health care provider if you often feel depressed.  Tell your health care provider if you have ever been abused or do not feel safe at home. This information is not intended to replace advice given to you by your health care provider. Make sure you discuss any questions you have with your health care provider. Document Released: 04/14/2011 Document Revised: 03/06/2016 Document Reviewed: 07/03/2015 Elsevier Interactive Patient Education  [employer].

## 2017-07-14 NOTE — Progress Notes (Signed)
Subjective:   Dana Coleman is a 76 y.o. female who presents for Medicare Annual (Subsequent) preventive examination.  Cardiac Risk Factors include: advanced age (>35men, >51 women);diabetes mellitus;dyslipidemia;hypertension     Objective:     Vitals: BP (!) 104/56 (BP Location: Right Arm, Patient Position: Sitting, Cuff Size: Normal)   Pulse 64   Temp 98.4 F (36.9 C) (Oral)   Ht 5\' 2"  (1.575 m)   Wt 135 lb 9.6 oz (61.5 kg)   SpO2 97%   BMI 24.80 kg/m   Body mass index is 24.8 kg/m.   Tobacco History  Smoking Status  . Never Smoker  Smokeless Tobacco  . Never Used     Counseling given: Yes Patient has never smoked and has no plans to start.   Past Medical History:  Diagnosis Date  . Breast cancer ([place]) 04/2008   Right breast  . Cataracts, bilateral   . Cervical cancer ([place])    When the patient was in her 55s  . Diabetes mellitus without complication ([place])   . Heart attack ([place]) 2004  . 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 Hyperlipidemia   . Hypertension   . Personal history of radiation therapy 2009   Past Surgical History:  Procedure Laterality Date  . BREAST LUMPECTOMY Right 2009  . BREAST SURGERY  2009   right lumpectomy  . CERVIX SURGERY  1980  . LEFT HEART CATHETERIZATION WITH CORONARY ANGIOGRAM N/A 04/20/2014   Procedure: LEFT HEART CATHETERIZATION WITH CORONARY ANGIOGRAM;  Surgeon: Jacolyn Reedy, MD;  Location: [facility] CATH LAB;  Service: Cardiovascular;  Laterality: N/A;  . thyroid     for nodule hemithyroidectomy, benign  . THYROIDECTOMY Right 03/2003   Family History  Problem Relation Age of Onset  . Heart disease Mother        age 8  . Heart disease Father   . Cancer Father        unknown  . Heart disease Brother   . Hypertension Brother   . Diabetes Daughter   . Cirrhosis Brother    History  Sexual Activity  . Sexual activity: Not Currently  . Birth control/ protection: Condom    Outpatient Encounter Prescriptions as of 07/14/2017   Medication Sig  . amLODipine (NORVASC) 5 MG tablet Take 1 tablet (5 mg total) by mouth daily.  Marland Kitchen aspirin 81 MG chewable tablet Chew 1 tablet (81 mg total) by mouth daily.  . Blood Glucose Monitoring Suppl MISC One touch ultra glucose monitor. Check blood sugar twice a day.  . cholecalciferol 2000 UNITS tablet Take 2,000 Units by mouth daily.   . cyclobenzaprine (FLEXERIL) 5 MG tablet Take 1 tablet (5 mg total) by mouth at bedtime as needed for muscle spasms.  Marland Kitchen glucose blood (ONE TOUCH ULTRA TEST) test strip 1 each by Other route See admin instructions. Check blood sugar twice daily.  . Insulin Glargine (LANTUS SOLOSTAR) 100 UNIT/ML Solostar Pen INJECT 30 UNITS INTO THE SKIN EVERY MORNING.  . insulin lispro (HUMALOG KWIKPEN) 100 UNIT/ML KiwkPen INJECT 6 UNITS INTO THE SKIN DAILY BEFORE SUPPER  . Insulin Pen Needle 31G X 5 MM MISC BD Pen Needles- brand specific Inject insulin via insulin pen 6 x daily  . Lancets MISC 1 each by Other route See admin instructions. Check blood sugar twice daily  . metFORMIN (GLUCOPHAGE) 500 MG tablet Take 2 tablets (1,000 mg total) by mouth 2 (two) times daily with a meal. (Patient taking differently: Take 500 mg by mouth daily  with breakfast. )  . metoprolol succinate (TOPROL-XL) 25 MG 24 hr tablet Take 25 mg by mouth daily.  . nitroGLYCERIN (NITROSTAT) 0.4 MG SL tablet Place 0.4 mg under the tongue every 5 (five) minutes as needed for chest pain.  Marland Kitchen olmesartan-hydrochlorothiazide (BENICAR HCT) 40-12.5 MG tablet Take 1 tablet by mouth daily.  . rosuvastatin (CRESTOR) 40 MG tablet TAKE 1 TABLET BY MOUTH AT BEDTIME FOR CHOLESTEROL  . [DISCONTINUED] alendronate (FOSAMAX) 70 MG tablet Take 1 tablet (70 mg total) by mouth every 7 (seven) days. Take with a full glass of water on an empty stomach. (Patient not taking: Reported on 07/14/2017)   No facility-administered encounter medications on file as of 07/14/2017.     Activities of Daily Living In your present state of  health, do you have any difficulty performing the following activities: 07/14/2017  Hearing? N  Vision? N  Difficulty concentrating or making decisions? N  Walking or climbing stairs? Y  Comment SHOB  Dressing or bathing? N  Doing errands, shopping? N  Preparing Food and eating ? N  Using the Toilet? N  In the past six months, have you accidently leaked urine? N  Do you have problems with loss of bowel control? N  Managing your Medications? N  Managing your Finances? N  Housekeeping or managing your Housekeeping? N  Some recent data might be hidden   Home Safety:  My home has a working smoke alarm:  Yes X 2           My home throw rugs have been fastened down to the floor or removed:  Non-slip backs I have a non-slip surface or non-slip mats in the bathtub and shower:  Mat        All my home's stairs have handrails, including any outdoor stairs  One level home with 2 outside steps with handrails          My home's floors, stairs and hallways are free from clutter, wires and cords:  Yes     I have animals in my home  No I wear seatbelts consistently:  Yes   Patient Care Team: Everrett Coombe, MD as PCP - General Luberta Mutter, MD (Ophthalmology) Jacolyn Reedy, MD (Cardiology)    Assessment:     Exercise Activities and Dietary recommendations Current Exercise Habits: The patient has a physically strenous job, but 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 Increase water intake          Infuse with fruit or vegetable    . Weight < 135 lb (61.236 kg)          Goal Weigh of 135 lbs       Fall Risk Fall Risk  07/14/2017 04/07/2017 10/21/2016 11/16/2015 02/07/2015  Falls in the past year? No No No No No  Risk for fall due to : Medication side effect - - - -   TUG Test:  Done in 10 seconds. Falls prevention discussed in detail and literature given.  Cognitive Function: Mini-Cog  Passed with score  5/5   Depression Screen PHQ 2/9 Scores 07/14/2017 04/07/2017 10/21/2016 05/22/2016  PHQ - 2 Score 0 0 0 0     Cognitive Function MMSE - Mini Mental State Exam 01/03/2014  Orientation to time 5  Orientation to Place 5  Registration 3  Attention/ Calculation 4  Recall 3  Language- name 2 objects 2  Language- repeat 1  Language- follow 3 step command 3  Language- read & follow direction 1  Write a sentence 1  Copy design 1  Total score 29        Immunization History  Administered Date(s) Administered  . Influenza Split 06/30/2011  . Influenza Whole 07/24/2008, 08/24/2009, 08/15/2010  . Influenza,inj,Quad PF,6+ Mos 06/29/2013, 08/14/2014, 11/07/2014, 08/09/2015, 08/05/2016, 07/14/2017  . Pneumococcal Conjugate-13 11/16/2015  . Pneumococcal Polysaccharide-23 11/14/1999, 08/15/2010, 06/30/2011  . Td 03/13/2006   Screening Tests Health Maintenance  Topic Date Due  . TETANUS/TDAP  03/13/2016  . INFLUENZA VACCINE  05/13/2017  . HEMOGLOBIN A1C  10/07/2017  . OPHTHALMOLOGY EXAM  01/20/2018  . FOOT EXAM  04/07/2018  . COLONOSCOPY  11/01/2019  . DEXA SCAN  Completed  . PNA vac Low Risk Adult  Completed   Flu vaccine administered today Patient will obtain TDaP at local pharmacy Discussed Shingrix Hgb A1c done today = 8.7 Last A1c 8.8 on 04/07/2017 Patient taking metformin 500 mg daily instead of 1000 mg BID as ordered. States if she takes more than 500 mg she becomes nauseated    Plan:      I have personally reviewed and noted the following in the patient's chart:   . Medical and social history . Use of alcohol, tobacco or illicit drugs  . Current medications and supplements . Functional ability and status . Nutritional status . Physical activity . Advanced directives . List of other physicians . Hospitalizations, surgeries, and ER visits in previous 12 months . Vitals . Screenings to include cognitive, depression, and falls . Referrals and appointments  In addition,  I have reviewed and discussed with patient certain preventive protocols, quality metrics, and best practice recommendations. A written personalized care plan for preventive services as well as general preventive health recommendations were provided to patient.     Velora Heckler, RN  07/14/2017

## 2017-07-16 ENCOUNTER — Telehealth: Payer: Self-pay

## 2017-07-16 NOTE — Telephone Encounter (Signed)
LVM for pt to call the office. If pt calls, please schedule a appt for diabetes. See note below. Ottis Stain, CMA

## 2017-07-16 NOTE — Progress Notes (Signed)
I have reviewed this visit and discussed with Howell Rucks, RN, BSN, and agree with her documentation.  Dana Coombe, MD PGY-2 [facility]

## 2017-07-16 NOTE — Telephone Encounter (Signed)
-----   Message from Everrett Coombe, MD sent at 07/14/2017  3:22 PM EDT ----- Please call and have patient schedule a PCP visit for diabetes. Her A1c at today's medicare visit was elevated.  Thanks, Lauren   ----- Message ----- From: Kinnie Feil, MD Sent: 07/14/2017   3:12 PM To: Everrett Coombe, MD  Please f/u with your patient for A1C result. Test was ordered in my name although she was seen by Ander Purpura.  ----- Message ----- From: [place] Pink, CMA Sent: 07/14/2017   2:30 PM To: Kinnie Feil, MD

## 2017-07-17 ENCOUNTER — Other Ambulatory Visit: Payer: Self-pay | Admitting: Student in an Organized Health Care Education/Training Program

## 2017-07-21 NOTE — Telephone Encounter (Signed)
Contacted pt and scheduled an appointment for this.  Pt want it on a Tuesday so first one available is 08/18/17. Katharina Caper, April D, [place]

## 2017-08-18 ENCOUNTER — Encounter: Payer: Self-pay | Admitting: Student in an Organized Health Care Education/Training Program

## 2017-08-18 ENCOUNTER — Ambulatory Visit (INDEPENDENT_AMBULATORY_CARE_PROVIDER_SITE_OTHER): Payer: Medicare Other | Admitting: Student in an Organized Health Care Education/Training Program

## 2017-08-18 VITALS — BP 130/64 | HR 52 | Temp 98.3°F | Ht 62.0 in | Wt 138.2 lb

## 2017-08-18 DIAGNOSIS — E785 Hyperlipidemia, unspecified: Secondary | ICD-10-CM

## 2017-08-18 DIAGNOSIS — I10 Essential (primary) hypertension: Secondary | ICD-10-CM

## 2017-08-18 DIAGNOSIS — E1165 Type 2 diabetes mellitus with hyperglycemia: Secondary | ICD-10-CM

## 2017-08-18 DIAGNOSIS — IMO0002 Reserved for concepts with insufficient information to code with codable children: Secondary | ICD-10-CM

## 2017-08-18 DIAGNOSIS — E119 Type 2 diabetes mellitus without complications: Secondary | ICD-10-CM | POA: Diagnosis not present

## 2017-08-18 DIAGNOSIS — Z794 Long term (current) use of insulin: Secondary | ICD-10-CM | POA: Diagnosis not present

## 2017-08-18 MED ORDER — GLIMEPIRIDE 1 MG PO TABS: 1.0000 mg | ORAL_TABLET | Freq: Every day | ORAL | 0 refills | Status: DC

## 2017-08-18 NOTE — Patient Instructions (Signed)
It was a pleasure seeing you today in our clinic. Today we discussed your diabetes. Here is the treatment plan we have discussed and agreed upon together:  We started a new pill called GLIMEPIRIDE today. You can take one of these pills each morning with breakfast. Please follow-up in 2 months for your next A1c, or sooner as needed if you feel you are not tolerating this new medication.  Our clinic's number is [phone]. Please call with questions or concerns about what we discussed today.  Be well, Dr. Burr Medico

## 2017-08-18 NOTE — Assessment & Plan Note (Signed)
A1c is marginally improved from previous. Does not seem like we've room to go up on the Lantus given that blood sugars in the morning are in the low 100s. Patient does not check blood sugars throughout the day, still uncertain whether she would be able to be compliant with sliding scale short acting insulin throughout the day.  - Add glimepiride 1 mg every morning - Continue current insulin regimen with Lantus 30 mg every morning and 6 units of Humalog with dinner - Continue 500 mg metformin every morning, patient does not tolerate more than this - Follow-up in 2 months for next A1c, sooner if does not tolerate glimepiride - Can consider increasing clinic bright at next visit as needed for elevated A1c

## 2017-08-18 NOTE — Assessment & Plan Note (Signed)
Blood pressure is very well controlled on current regimen. No red flag symptoms. Continue 3 agents.

## 2017-08-18 NOTE — Progress Notes (Signed)
   CC: Diabetes follow-up  HPI: Dana Coleman is a 76 y.o. female with PMH significant for hypertension, diabetes, hyperlipidemia who presents to [facility] today for follow-up of chronic illnesses.  HYPERTENSION Disease Monitoring: Blood pressure range-does not check at home  Chest pain, palpitations- none       Dyspnea- none Medications: amlodipine 5 mg daily, Toprol 25 mg daily, olmesartan-HCTZ 40-12/5 daily Compliance- 100% reported compliance  Lightheadedness,Syncope- none    Edema- none  DIABETES Disease Monitoring: A1c was 8.7 one month ago from 8.8 Blood Sugar ranges- 104, 106 in AM, 170, 180 at night  Polyuria/phagia/dipsia- denies       Visual problems- denies Medications: metformin 500 mg daily, humalog 6u in the evening with dinner, lantus 30 mg qAM Compliance- none  Hypoglycemic symptoms- none  HYPERLIPIDEMIA Disease Monitoring: See symptoms for Hypertension Medications: Crestor 40 mg daily Compliance- 100% reported compliance  Right upper quadrant pain- denies   Muscle aches- denies  Monitoring Labs and Parameters Last A1C:  Lab Results  Component Value Date   HGBA1C 8.7 07/14/2017    Last BPs:  BP Readings from Last 3 Encounters:  08/18/17 130/64  07/14/17 (!) 104/56  04/07/17 126/70   Review of Symptoms:  See HPI for ROS.   CC, SH/smoking status, and VS noted.  Objective: BP 130/64   Pulse (!) 52   Temp 98.3 F (36.8 C) (Oral)   Ht 5\' 2"  (1.575 m)   Wt 138 lb 3.2 oz (62.7 kg)   SpO2 95%   BMI 25.28 kg/m  GEN: NAD, alert, cooperative, and pleasant. EYE: no conjunctival injection RESPIRATORY: Comfortable work of breathing, speaks in full sentences CV: RRR, no m/r/g GI: soft, nondistended PSYCH: AAOx3, appropriate affect  Assessment and plan:  Insulin dependent type 2 diabetes mellitus, uncontrolled (HCC) A1c is marginally improved from previous. Does not seem like we've room to go up on the Lantus given that blood sugars in the morning  are in the low 100s. Patient does not check blood sugars throughout the day, still uncertain whether she would be able to be compliant with sliding scale short acting insulin throughout the day.  - Add glimepiride 1 mg every morning - Continue current insulin regimen with Lantus 30 mg every morning and 6 units of Humalog with dinner - Continue 500 mg metformin every morning, patient does not tolerate more than this - Follow-up in 2 months for next A1c, sooner if does not tolerate glimepiride - Can consider increasing clinic bright at next visit as needed for elevated A1c   Hypertension Blood pressure is very well controlled on current regimen. No red flag symptoms. Continue 3 agents.  Hyperlipidemia No red flag symptoms. Continue statin   Meds ordered this encounter  Medications  . glimepiride (AMARYL) 1 MG tablet    Sig: Take 1 tablet (1 mg total) daily before breakfast by mouth.    Dispense:  30 tablet    Refill:  0    Everrett Coombe, MD,MS,  PGY2 08/18/2017 10:48 AM

## 2017-08-18 NOTE — Assessment & Plan Note (Signed)
No red flag symptoms. Continue statin

## 2017-09-23 ENCOUNTER — Other Ambulatory Visit: Payer: Self-pay | Admitting: Student in an Organized Health Care Education/Training Program

## 2017-09-23 DIAGNOSIS — Z794 Long term (current) use of insulin: Principal | ICD-10-CM

## 2017-09-23 DIAGNOSIS — E119 Type 2 diabetes mellitus without complications: Secondary | ICD-10-CM

## 2017-09-23 MED ORDER — GLIMEPIRIDE 1 MG PO TABS: 1.0000 mg | ORAL_TABLET | Freq: Every day | ORAL | 0 refills | Status: DC

## 2017-09-23 NOTE — Progress Notes (Signed)
Received fax refill request for glimepride. Sent electronically.

## 2017-09-29 ENCOUNTER — Other Ambulatory Visit: Payer: Self-pay | Admitting: Student in an Organized Health Care Education/Training Program

## 2017-09-29 DIAGNOSIS — Z794 Long term (current) use of insulin: Principal | ICD-10-CM

## 2017-09-29 DIAGNOSIS — IMO0002 Reserved for concepts with insufficient information to code with codable children: Secondary | ICD-10-CM

## 2017-09-29 DIAGNOSIS — E1165 Type 2 diabetes mellitus with hyperglycemia: Secondary | ICD-10-CM

## 2017-11-10 ENCOUNTER — Other Ambulatory Visit: Payer: Self-pay | Admitting: Student in an Organized Health Care Education/Training Program

## 2017-11-10 DIAGNOSIS — I10 Essential (primary) hypertension: Secondary | ICD-10-CM | POA: Diagnosis not present

## 2017-11-10 DIAGNOSIS — E7849 Other hyperlipidemia: Secondary | ICD-10-CM | POA: Diagnosis not present

## 2017-11-10 DIAGNOSIS — I2510 Atherosclerotic heart disease of native coronary artery without angina pectoris: Secondary | ICD-10-CM | POA: Diagnosis not present

## 2017-11-10 DIAGNOSIS — I252 Old myocardial infarction: Secondary | ICD-10-CM | POA: Diagnosis not present

## 2017-11-17 ENCOUNTER — Other Ambulatory Visit: Payer: Self-pay

## 2017-11-17 ENCOUNTER — Encounter: Payer: Self-pay | Admitting: Family Medicine

## 2017-11-17 ENCOUNTER — Ambulatory Visit (INDEPENDENT_AMBULATORY_CARE_PROVIDER_SITE_OTHER): Payer: Medicare Other | Admitting: Family Medicine

## 2017-11-17 VITALS — BP 105/60 | HR 63 | Temp 98.6°F | Wt 138.0 lb

## 2017-11-17 DIAGNOSIS — IMO0002 Reserved for concepts with insufficient information to code with codable children: Secondary | ICD-10-CM

## 2017-11-17 DIAGNOSIS — B070 Plantar wart: Secondary | ICD-10-CM | POA: Insufficient documentation

## 2017-11-17 DIAGNOSIS — E1165 Type 2 diabetes mellitus with hyperglycemia: Secondary | ICD-10-CM

## 2017-11-17 DIAGNOSIS — Z794 Long term (current) use of insulin: Secondary | ICD-10-CM

## 2017-11-17 DIAGNOSIS — Z23 Encounter for immunization: Secondary | ICD-10-CM | POA: Diagnosis not present

## 2017-11-17 LAB — POCT GLYCOSYLATED HEMOGLOBIN (HGB A1C): HEMOGLOBIN A1C: 7.4

## 2017-11-17 MED ORDER — TETANUS-DIPHTH-ACELL PERTUSSIS 5-2.5-18.5 LF-MCG/0.5 IM SUSP: 0.5000 mL | Freq: Once | INTRAMUSCULAR | 0 refills | Status: AC

## 2017-11-17 NOTE — Assessment & Plan Note (Signed)
Small on R foot, midline anterior to calcaneous.   Discussed using over the counter plantar wart medication from her pharmacy

## 2017-11-17 NOTE — Assessment & Plan Note (Signed)
We discussed promising improvement of A1C to 7.4 from >8.   She has made diet improvements in addition to the new glymeperide 1mg .   Will continue diet improvements and current meds.   No changes at this time and will see pcp in 3 months.

## 2017-11-17 NOTE — Patient Instructions (Signed)
It was a pleasure to see you today! Thank you for choosing [facility] for your primary care. Dana Coleman was seen for diabetes followup. Come back to the clinic if you have any new concerns, and go to the emergency room if you have any life threatening symptoms.  Today we celebrated that your A1C was down to 7.4 which is a significant improvement.  We talked about the new medication likely helping but your commitment to making some diet changes was absolutely part of this success.   For the plantars wart on your foot you can use over the counter plantar wart medication from any pharmacy.  If we did any lab work today, and the results require attention, either me or my nurse will get in touch with you. If everything is normal, you will get a letter in mail and a message via . If you don't hear from [place] in two weeks, please give [place] a call. Otherwise, we look forward to seeing you again at your next visit. If you have any questions or concerns before then, please call the clinic at [phone].  Please bring all your medications to every doctors visit  Sign up for My Chart to have easy access to your labs results, and communication with your Primary care physician.    Please check-out at the front desk before leaving the clinic.    Best,  Dr. Sherene Sires FAMILY MEDICINE RESIDENT - PGY1 11/17/2017 10:23 AM

## 2017-11-17 NOTE — Progress Notes (Signed)
    Subjective:  [name] is a 77 y.o. female who presents to the [facility] today with a chief complaint of DM management.   HPI: Patient returns for followup to discuss diabetes management 3 months after starting glymepride 1mg .  She states she has also been trying to make better food choices and has added more vegatables to he diet in place of pastas/breads.  She has been consistent with her meds as prescribed.  She also notes a new tender bump on the bottom of her right foot, midline anterior to calcaneous.   It is not pustular/bleeding and she has had no specific trauma.  She wants to be checked to make sure she has no ulcer.  Objective:  Physical Exam: BP 105/60   Pulse 63   Temp 98.6 F (37 C) (Oral)   Wt 138 lb (62.6 kg)   SpO2 97%   BMI 25.24 kg/m   Gen: NAD, conversing comfortably CV: RRR with no murmurs appreciated Pulm: NWOB, CTAB with no crackles, wheezes, or rhonchi GI: Soft, Nontender, Nondistended. MSK: no edema, cyanosis, or clubbing noted Skin: warm, dry Foot complaint:  Small plantar wart on R foot, no indication of ulceration at this time. Neuro: grossly normal, moves all extremities Psych: Normal affect and thought content  Results for orders placed or performed in visit on 11/17/17 (from the past 72 hour(s))  HgB A1c     Status: None   Collection Time: 11/17/17 10:02 AM  Result Value Ref Range   Hemoglobin A1C 7.4      Assessment/Plan:  Insulin dependent type 2 diabetes mellitus, uncontrolled (HCC) We discussed promising improvement of A1C to 7.4 from >8.   She has made diet improvements in addition to the new glymeperide 1mg .   Will continue diet improvements and current meds.   No changes at this time and will see pcp in 3 months.  Plantar wart Small on R foot, midline anterior to calcaneous.   Discussed using over the counter plantar wart medication from her pharmacy   Sherene Sires, [place] - PGY1 11/17/2017 10:35 AM

## 2017-11-18 ENCOUNTER — Other Ambulatory Visit: Payer: Self-pay | Admitting: Student in an Organized Health Care Education/Training Program

## 2017-12-12 ENCOUNTER — Emergency Department (HOSPITAL_COMMUNITY): Payer: Medicare Other

## 2017-12-12 ENCOUNTER — Encounter (HOSPITAL_COMMUNITY): Payer: Self-pay | Admitting: Emergency Medicine

## 2017-12-12 ENCOUNTER — Other Ambulatory Visit: Payer: Self-pay

## 2017-12-12 ENCOUNTER — Observation Stay (HOSPITAL_COMMUNITY)
Admission: EM | Admit: 2017-12-12 | Discharge: 2017-12-13 | Disposition: A | Discharge status: Home / Self Care | Payer: Medicare Other | Attending: Family Medicine | Admitting: Family Medicine

## 2017-12-12 ENCOUNTER — Observation Stay (HOSPITAL_COMMUNITY): Payer: Medicare Other

## 2017-12-12 DIAGNOSIS — E785 Hyperlipidemia, unspecified: Secondary | ICD-10-CM | POA: Insufficient documentation

## 2017-12-12 DIAGNOSIS — Z7982 Long term (current) use of aspirin: Secondary | ICD-10-CM | POA: Insufficient documentation

## 2017-12-12 DIAGNOSIS — Z794 Long term (current) use of insulin: Secondary | ICD-10-CM | POA: Diagnosis not present

## 2017-12-12 DIAGNOSIS — R079 Chest pain, unspecified: Secondary | ICD-10-CM | POA: Diagnosis not present

## 2017-12-12 DIAGNOSIS — I2510 Atherosclerotic heart disease of native coronary artery without angina pectoris: Secondary | ICD-10-CM | POA: Insufficient documentation

## 2017-12-12 DIAGNOSIS — I10 Essential (primary) hypertension: Secondary | ICD-10-CM | POA: Diagnosis not present

## 2017-12-12 DIAGNOSIS — I252 Old myocardial infarction: Secondary | ICD-10-CM | POA: Insufficient documentation

## 2017-12-12 DIAGNOSIS — R0789 Other chest pain: Secondary | ICD-10-CM | POA: Diagnosis not present

## 2017-12-12 DIAGNOSIS — Z8541 Personal history of malignant neoplasm of cervix uteri: Secondary | ICD-10-CM | POA: Insufficient documentation

## 2017-12-12 DIAGNOSIS — Z853 Personal history of malignant neoplasm of breast: Secondary | ICD-10-CM | POA: Diagnosis not present

## 2017-12-12 DIAGNOSIS — IMO0002 Reserved for concepts with insufficient information to code with codable children: Secondary | ICD-10-CM

## 2017-12-12 DIAGNOSIS — E119 Type 2 diabetes mellitus without complications: Secondary | ICD-10-CM | POA: Diagnosis not present

## 2017-12-12 DIAGNOSIS — E1165 Type 2 diabetes mellitus with hyperglycemia: Secondary | ICD-10-CM

## 2017-12-12 DIAGNOSIS — R42 Dizziness and giddiness: Secondary | ICD-10-CM | POA: Diagnosis not present

## 2017-12-12 DIAGNOSIS — I200 Unstable angina: Secondary | ICD-10-CM | POA: Diagnosis not present

## 2017-12-12 LAB — ECHOCARDIOGRAM COMPLETE
AVLVOTPG: 3 mmHg
CHL CUP DOP CALC LVOT VTI: 17.2 cm
E decel time: 285 msec
EERAT: 6.23
FS: 25 % — AB (ref 28–44)
Height: 62 in
IVS/LV PW RATIO, ED: 1.3
LA ID, A-P, ES: 26 mm
LA diam index: 1.58 cm/m2
LA vol A4C: 20.8 ml
LA vol index: 17.8 mL/m2
LA vol: 29.3 mL
LDCA: 3.46 cm2
LEFT ATRIUM END SYS DIAM: 26 mm
LV SIMPSON'S DISK: 56
LV TDI E'MEDIAL: 6.31
LV dias vol index: 38 mL/m2
LV dias vol: 63 mL (ref 46–106)
LV sys vol index: 17 mL/m2
LV sys vol: 28 mL
LVEEAVG: 6.23
LVEEMED: 6.23
LVELAT: 9.14 cm/s
LVOTD: 21 mm
LVOTPV: 89.4 cm/s
LVOTSV: 60 mL
MV Dec: 285
MV pk A vel: 85.7 m/s
MVAP: 2.62 cm2
MVPKEVEL: 56.9 m/s
P 1/2 time: 84 ms
PW: 10 mm — AB (ref 0.6–1.1)
RV LATERAL S' VELOCITY: 10.9 cm/s
RV TAPSE: 16.9 mm
Stroke v: 36 ml
TDI e' lateral: 9.14
Weight: 2160 oz

## 2017-12-12 LAB — TSH: TSH: 0.762 u[IU]/mL (ref 0.350–4.500)

## 2017-12-12 LAB — T4, FREE: FREE T4: 0.75 ng/dL (ref 0.61–1.12)

## 2017-12-12 LAB — BASIC METABOLIC PANEL
ANION GAP: 13 (ref 5–15)
BUN: 13 mg/dL (ref 6–20)
CALCIUM: 9.8 mg/dL (ref 8.9–10.3)
CHLORIDE: 102 mmol/L (ref 101–111)
CO2: 25 mmol/L (ref 22–32)
CREATININE: 0.96 mg/dL (ref 0.44–1.00)
GFR calc non Af Amer: 56 mL/min — ABNORMAL LOW (ref >60)
Glucose, Bld: 89 mg/dL (ref 65–99)
Potassium: 3.9 mmol/L (ref 3.5–5.1)
SODIUM: 140 mmol/L (ref 135–145)

## 2017-12-12 LAB — GLUCOSE, CAPILLARY
GLUCOSE-CAPILLARY: 83 mg/dL (ref 65–99)
GLUCOSE-CAPILLARY: 140 mg/dL — AB (ref 65–99)
GLUCOSE-CAPILLARY: 147 mg/dL — AB (ref 65–99)

## 2017-12-12 LAB — TROPONIN I

## 2017-12-12 LAB — I-STAT TROPONIN, ED
TROPONIN I, POC: 0 ng/mL (ref 0.00–0.08)
Troponin i, poc: 0 ng/mL (ref 0.00–0.08)

## 2017-12-12 LAB — CBC
HCT: 40.1 % (ref 36.0–46.0)
HEMOGLOBIN: 12.9 g/dL (ref 12.0–15.0)
MCH: 28.9 pg (ref 26.0–34.0)
MCHC: 32.2 g/dL (ref 30.0–36.0)
MCV: 89.7 fL (ref 78.0–100.0)
PLATELETS: 213 10*3/uL (ref 150–400)
RBC: 4.47 MIL/uL (ref 3.87–5.11)
RDW: 15.3 % (ref 11.5–15.5)
WBC: 9 10*3/uL (ref 4.0–10.5)

## 2017-12-12 MED ORDER — NITROGLYCERIN 0.4 MG SL SUBL
0.4000 mg | SUBLINGUAL_TABLET | Freq: Once | SUBLINGUAL | Status: AC
  Administered 2017-12-12: 0.4 mg via SUBLINGUAL
  Filled 2017-12-12: qty 1

## 2017-12-12 MED ORDER — ASPIRIN 81 MG PO CHEW
81.0000 mg | CHEWABLE_TABLET | Freq: Every day | ORAL | Status: DC
  Administered 2017-12-13: 81 mg via ORAL
  Filled 2017-12-12: qty 1

## 2017-12-12 MED ORDER — NITROGLYCERIN 0.4 MG SL SUBL: 0.4000 mg | SUBLINGUAL_TABLET | SUBLINGUAL | Status: DC | PRN

## 2017-12-12 MED ORDER — ENOXAPARIN SODIUM 40 MG/0.4ML ~~LOC~~ SOLN: 40.0000 mg | SUBCUTANEOUS | Status: DC

## 2017-12-12 MED ORDER — INSULIN ASPART 100 UNIT/ML ~~LOC~~ SOLN
0.0000 [IU] | Freq: Three times a day (TID) | SUBCUTANEOUS | Status: DC
  Administered 2017-12-12: 1 [IU] via SUBCUTANEOUS
  Administered 2017-12-13: 3 [IU] via SUBCUTANEOUS

## 2017-12-12 MED ORDER — ONDANSETRON HCL 4 MG/2ML IJ SOLN: 4.0000 mg | Freq: Four times a day (QID) | INTRAMUSCULAR | Status: DC | PRN

## 2017-12-12 MED ORDER — ASPIRIN 81 MG PO CHEW
324.0000 mg | CHEWABLE_TABLET | Freq: Once | ORAL | Status: AC
  Administered 2017-12-12: 324 mg via ORAL
  Filled 2017-12-12: qty 4

## 2017-12-12 MED ORDER — ROSUVASTATIN CALCIUM 40 MG PO TABS
40.0000 mg | ORAL_TABLET | Freq: Every evening | ORAL | Status: DC
  Administered 2017-12-12 – 2017-12-13 (×2): 40 mg via ORAL
  Filled 2017-12-12: qty 1
  Filled 2017-12-12 (×3): qty 2
  Filled 2017-12-12: qty 1

## 2017-12-12 NOTE — ED Triage Notes (Signed)
Pt c/o midsternal chest pain started this am, has hx of a stent-- Dr. Wynonia Lawman is cardiologist.

## 2017-12-12 NOTE — H&P (Signed)
[facility] Admission History and Physical Service Pager: [phone]  Patient name: Dana Coleman Medical record number: [id number] Date of birth: 08-Feb-1941 Age: 77 y.o. Gender: female  Primary Care Provider: Everrett Coombe, MD Consultants: None Code Status: Full, confirmed on admission  Chief Complaint: Right sided chest pain  Assessment and Plan: Dana Coleman is a 77 y.o. female presenting with atypical CP relieved by nitro . PMH is significant for MI (2004) with stent, T2DM, HTN, HLD, thyroid nodule  Atypical Chest pain/ACS rule-out - may be unstable angina. CP came on at rest and was associated with light headedness and dyspnea. CP is atypical as it was right sided, non-exertional, and without associated diaphoresis or nausea. Pain was relieved by nitro. Initial EKG and istat troponin negative x2 in ED. She has significant cardiac risk factors with history of MI in 2004 s/p RCA stent, and most recent cardiac cath in 2015 notable for 50% stenosis proximal LAD. Presentation is not consistent with new onset CHF with no PND, no evidence of volume overload. She does not have productive cough, white count or fevers to think of infectious etiology. Vitals stable without tachycardia, tachypnea or O2 desats and no risk factors to think of PE. - Admit as observation, attending Dr. McDiarmid - monitor on telemetry - trend troponin I - monitor O2 saturation - AM EKG - check cardiac echo - LVEF of 50% documented via cardiac cath in 04/2014 - risk stratify with lipid panel, TSH - Pt follows with Dr. Oran Rein w/Cards, called to let his team know she is admitted   Light headedness/generalized fatigue -  Vague, uncertain etiology. May be cardiac cause, will order echo to rule out valvular or wall dysfunction that may cause these symptoms. CBG WNL though patient has not taken any diabetic medications this AM, so episodic hypoglycemia is possible. Additionally she has not  taken any BP meds this AM (though did receive nitro) so orthostatic hypotension is also possible. Additionally she has had a mild viral illness over past week and this may be cause for her symptoms. Otherwise, pt AAOx4 and normal neuro exam. - PT/OT - monitor BP, blood sugars - check orthostatics - cardiac workup as above, will check echo - TSH  HTN, controlled  - Normotensive in the ED. Reports she did not take medications this AM due to coming into hospital. Home meds: amlodipine 5 mg daily, Toprol 25 mg daily, olmesartan-HCTZ 40-12/5 daily. Due to complaints of dizziness and fatigue over past 1-2 days, will plan to hold off restarting and add back home meds as BP requires. - add back home meds as needed  Diabetes, controlled  - Last A1c at goal 7.4 on 11/17/2017. Home meds: metformin 500 mg daily, humalog 6u in the evening with dinner, lantus 30 mg qAM, glimepiride 1 mg qd. Patient did not take any medications this AM. Random glucose in ED was 89. Due to complaints of fatigue and dizziness, we can hold off restarting home meds, add back as needed to avoid hypoglycemia. - sSSI - add back home meds as needed/add lantus as needed  HLD - last lipid panel 10/2016 showed Cholesterol 142, Triglyceride 64, HDL 60, LDL 68. Home med: Crestor 40 mg daily.  - continue home statin  Hx thyroid nodule - s/p resection 2004 - check TSH/FT4  FEN/GI: heart healthy/carb modified Prophylaxis: lovenox  Disposition: Admit as observation to telemetry  History of Present Illness:  Dana Coleman is a 77 y.o. diabetic female nonsmoker  with history of MI in 2004 s/p multiple stents presenting with atypical CP that came on this AM. Patient was in her usual state of health until yesterday when she noticed some weakness, and this morning she noticed some dizziness. She was sitting at rest when she developed sharp, non-radiating 6/10 chest pain that was associated with feelings of weakness and dyspnea. She denies  anything making it better or worse. Pain remained constant for approximately 1 hour until she received nitroglycerin in the emergency department, and it subsequently improved. She also got ASA in ED.   Patient endorses recent episode of emesis x2, 4 days ago and feels she may be getting over a mild virus. She has had nonproductive cough and no fevers. No diarrhea or urinary symptoms. She has been sleeping with one pillow, has not noticed dyspnea or CP on exertion and has not had any peripheral edema.   Review Of Systems: Per HPI with the following additions:   Review of Systems  Constitutional: Negative for chills, diaphoresis and fever.  HENT: Negative for congestion and sore throat.   Respiratory: Negative for wheezing.   Cardiovascular: Negative for palpitations, leg swelling and PND.  Gastrointestinal: Negative for constipation and diarrhea.  Genitourinary: Negative for dysuria.  Neurological: Negative for headaches.    Patient Active Problem List   Diagnosis Date Noted  . Chest pain 12/12/2017  . Plantar wart 11/17/2017  . Ruptured tympanic membrane, left 10/21/2016  . Rectal bleeding 05/23/2016  . Back pain 02/07/2015  . Insulin dependent type 2 diabetes mellitus, uncontrolled ([place]) 08/14/2014  . History of thyroid nodule 01/16/2011  . History of breast cancer (right)  stage 1 ERA positive   . History of cervical cancer   . Hyperlipidemia   . Hypertension   . CAD (coronary artery disease), native coronary artery   . Urinary incontinence, mixed 12/10/2006  . OSTEOPENIA 12/10/2006    Past Medical History: Past Medical History:  Diagnosis Date  . Breast cancer ([place]) 04/2008   Right breast  . Cataracts, bilateral   . Cervical cancer ([place])    When the patient was in her 42s  . Diabetes mellitus without complication ([place])   . Heart attack ([place]) 2004  . 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 Hyperlipidemia   . Hypertension   . Personal history of radiation therapy 2009    Past  Surgical History: Past Surgical History:  Procedure Laterality Date  . BREAST LUMPECTOMY Right 2009  . BREAST SURGERY  2009   right lumpectomy  . CERVIX SURGERY  1980  . LEFT HEART CATHETERIZATION WITH CORONARY ANGIOGRAM N/A 04/20/2014   Procedure: LEFT HEART CATHETERIZATION WITH CORONARY ANGIOGRAM;  Surgeon: Jacolyn Reedy, MD;  Location[facility] CATH LAB;  Service: Cardiovascular;  Laterality: N/A;  . thyroid     for nodule hemithyroidectomy, benign  . THYROIDECTOMY Right 03/2003    Social History: Social History   Tobacco Use  . Smoking status: Never Smoker  . Smokeless tobacco: Never Used  Substance Use Topics  . Alcohol use: No  . Drug use: No   Additional social history:   Please also refer to relevant sections of EMR.  Family History: Family History  Problem Relation Age of Onset  . Heart disease Mother        age 40  . Heart disease Father   . Cancer Father        unknown  . Heart disease Brother   . Hypertension Brother   . Diabetes Daughter   .  Cirrhosis Brother    (If not completed, MUST add something in)  Allergies and Medications: No Known Allergies No current facility-administered medications on file prior to encounter.    Current Outpatient Medications on File Prior to Encounter  Medication Sig Dispense Refill  . amLODipine (NORVASC) 5 MG tablet Take 1 tablet (5 mg total) by mouth daily. 30 tablet 12  . aspirin 81 MG chewable tablet Chew 1 tablet (81 mg total) by mouth daily. 90 tablet 3  . cholecalciferol 2000 UNITS tablet Take 2,000 Units by mouth daily.     . cyclobenzaprine (FLEXERIL) 5 MG tablet Take 1 tablet (5 mg total) by mouth at bedtime as needed for muscle spasms. 5 tablet 0  . glimepiride (AMARYL) 1 MG tablet Take 1 tablet (1 mg total) by mouth daily before breakfast. 90 tablet 0  . HUMALOG KWIKPEN 100 UNIT/ML KiwkPen INJECT 6 UNITS INTO THE SKIN DAILY BEFORE SUPPER 15 pen 5  . Insulin Glargine (LANTUS SOLOSTAR) 100 UNIT/ML Solostar Pen  INJECT 30 UNITS INTO THE SKIN EVERY MORNING. 60 pen 4  . metFORMIN (GLUCOPHAGE) 500 MG tablet Take 2 tablets (1,000 mg total) by mouth 2 (two) times daily with a meal. (Patient taking differently: Take 500 mg by mouth daily with breakfast. ) 270 tablet 3  . metoprolol succinate (TOPROL-XL) 25 MG 24 hr tablet Take 25 mg by mouth daily.    . nitroGLYCERIN (NITROSTAT) 0.4 MG SL tablet Place 0.4 mg under the tongue every 5 (five) minutes as needed for chest pain.    Marland Kitchen olmesartan-hydrochlorothiazide (BENICAR HCT) 40-12.5 MG tablet Take 1 tablet by mouth daily. 90 tablet 3  . rosuvastatin (CRESTOR) 40 MG tablet TAKE 1 TABLET BY MOUTH AT BEDTIME FOR CHOLESTEROL 90 tablet 2  . Blood Glucose Monitoring Suppl MISC One touch ultra glucose monitor. Check blood sugar twice a day.    . Insulin Pen Needle (B-D UF III MINI PEN NEEDLES) 31G X 5 MM MISC INJECT INSULIN VIA PEN 6 TIMES DAILY 200 each 3  . Lancets MISC 1 each by Other route See admin instructions. Check blood sugar twice daily 200 each 3  . ONE TOUCH ULTRA TEST test strip CHECK BLOOD SUGAR TWICE DAILY 100 each 0    Objective: BP 127/69 (BP Location: Left Arm)   Pulse 64   Temp 98.4 F (36.9 C) (Oral)   Resp 15   Ht 5\' 2"  (1.575 m)   Wt 135 lb (61.2 kg)   SpO2 100%   BMI 24.69 kg/m  Exam: General: NAD, rests comfortably in bed, pleasant Eyes: PERRL, EOMI, no conjunctival injection or pallor ENTM: MMM, no oropharyngeal erythema or exudate Neck: no LAD, no thyromegaly Cardiovascular: RRR, no m/r/g, no peripheral edema, no JVD Respiratory: CTA bil, no W/R/R Gastrointestinal: soft, nt, nd MSK: moves 4 extremities equally Derm: no rashes or lesions appreciated Neuro: CN II-XII intact Psych: AAOx3, appropriate  Labs and Imaging: CBC BMET  Recent Labs  Lab 12/12/17 0926  WBC 9.0  HGB 12.9  HCT 40.1  PLT 213   Recent Labs  Lab 12/12/17 0926  NA 140  K 3.9  CL 102  CO2 25  BUN 13  CREATININE 0.96  GLUCOSE 89  CALCIUM 9.8      Troponin: istat trop neg x2  EKG: nonspecific ST-T wave changes, no T wave elevations, unchanged from previous  Everrett Coombe, MD 12/12/2017, 1:18 PM PGY-2, [place] [facility] pager: [phone], text pages welcome

## 2017-12-12 NOTE — ED Notes (Signed)
Pt at xray

## 2017-12-12 NOTE — ED Provider Notes (Signed)
[place] EMERGENCY DEPARTMENT Provider Note   CSN: [id number] Arrival date & time: 12/12/17  0857     History   Chief Complaint Chief Complaint  Patient presents with  . Chest Pain    HPI Dana Coleman is a 77 y.o. female.  HPI Patient presents with chest pain.  Began this morning.  Has her anterior right-sided chest.  States she just feels bad all over.  No nausea.  No diaphoresis.  States she was feeling bad yesterday but having no chest pain.  No fever.  No cough.  No shortness of breath.  Has some swelling her legs and states is unchanged.  Previous cardiac stent.  Has nitroglycerin but did not take any this morning.  States she has not had her medicines this morning. Past Medical History:  Diagnosis Date  . Breast cancer ([place]) 04/2008   Right breast  . Cataracts, bilateral   . Cervical cancer ([place])    When the patient was in her 81s  . Diabetes mellitus without complication ([place])   . Heart attack ([place]) 2004  . 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 Hyperlipidemia   . Hypertension   . Personal history of radiation therapy 2009    Patient Active Problem List   Diagnosis Date Noted  . Plantar wart 11/17/2017  . Ruptured tympanic membrane, left 10/21/2016  . Rectal bleeding 05/23/2016  . Back pain 02/07/2015  . Insulin dependent type 2 diabetes mellitus, uncontrolled ([place]) 08/14/2014  . History of thyroid nodule 01/16/2011  . History of breast cancer (right)  stage 1 ERA positive   . History of cervical cancer   . Hyperlipidemia   . Hypertension   . CAD (coronary artery disease), native coronary artery   . Urinary incontinence, mixed 12/10/2006  . OSTEOPENIA 12/10/2006    Past Surgical History:  Procedure Laterality Date  . BREAST LUMPECTOMY Right 2009  . BREAST SURGERY  2009   right lumpectomy  . CERVIX SURGERY  1980  . LEFT HEART CATHETERIZATION WITH CORONARY ANGIOGRAM N/A 04/20/2014   Procedure: LEFT HEART CATHETERIZATION WITH CORONARY  ANGIOGRAM;  Surgeon: Jacolyn Reedy, MD;  Location[facility] CATH LAB;  Service: Cardiovascular;  Laterality: N/A;  . thyroid     for nodule hemithyroidectomy, benign  . THYROIDECTOMY Right 03/2003    OB History    No data available       Home Medications    Prior to Admission medications   Medication Sig Start Date End Date Taking? Authorizing Provider  amLODipine (NORVASC) 5 MG tablet Take 1 tablet (5 mg total) by mouth daily. 04/20/14  Yes Jacolyn Reedy, MD  aspirin 81 MG chewable tablet Chew 1 tablet (81 mg total) by mouth daily. 06/16/13  Yes Patrecia Pour, MD  cholecalciferol 2000 UNITS tablet Take 2,000 Units by mouth daily.  01/16/11  Yes Katherina Mires, MD  cyclobenzaprine (FLEXERIL) 5 MG tablet Take 1 tablet (5 mg total) by mouth at bedtime as needed for muscle spasms. 10/21/16  Yes Everrett Coombe, MD  glimepiride (AMARYL) 1 MG tablet Take 1 tablet (1 mg total) by mouth daily before breakfast. 09/23/17  Yes Everrett Coombe, MD  HUMALOG KWIKPEN 100 UNIT/ML KiwkPen INJECT 6 UNITS INTO THE SKIN DAILY BEFORE SUPPER 07/17/17  Yes Everrett Coombe, MD  Insulin Glargine (LANTUS SOLOSTAR) 100 UNIT/ML Solostar Pen INJECT 30 UNITS INTO THE SKIN EVERY MORNING. 11/18/17  Yes Everrett Coombe, MD  metFORMIN (GLUCOPHAGE) 500 MG tablet Take 2 tablets (1,000 mg  total) by mouth 2 (two) times daily with a meal. Patient taking differently: Take 500 mg by mouth daily with breakfast.  04/07/17  Yes Ronnie Doss M, DO  metoprolol succinate (TOPROL-XL) 25 MG 24 hr tablet Take 25 mg by mouth daily.   Yes [provider]  nitroGLYCERIN (NITROSTAT) 0.4 MG SL tablet Place 0.4 mg under the tongue every 5 (five) minutes as needed for chest pain.   Yes [provider]  olmesartan-hydrochlorothiazide (BENICAR HCT) 40-12.5 MG tablet Take 1 tablet by mouth daily. 02/26/17  Yes Everrett Coombe, MD  rosuvastatin (CRESTOR) 40 MG tablet TAKE 1 TABLET BY MOUTH AT BEDTIME FOR CHOLESTEROL 04/25/16  Yes Everrett Coombe, MD    Blood Glucose Monitoring Suppl MISC One touch ultra glucose monitor. Check blood sugar twice a day.    [provider]  Insulin Pen Needle (B-D UF III MINI PEN NEEDLES) 31G X 5 MM MISC INJECT INSULIN VIA PEN 6 TIMES DAILY 09/29/17   Everrett Coombe, MD  Lancets MISC 1 each by Other route See admin instructions. Check blood sugar twice daily 08/14/14   Patrecia Pour, MD  ONE TOUCH ULTRA TEST test strip CHECK BLOOD SUGAR TWICE DAILY 11/10/17   Everrett Coombe, MD    Family History Family History  Problem Relation Age of Onset  . Heart disease Mother        age 60  . Heart disease Father   . Cancer Father        unknown  . Heart disease Brother   . Hypertension Brother   . Diabetes Daughter   . Cirrhosis Brother     Social History Social History   Tobacco Use  . Smoking status: Never Smoker  . Smokeless tobacco: Never Used  Substance Use Topics  . Alcohol use: No  . Drug use: No     Allergies   Patient has no known allergies.   Review of Systems Review of Systems  Constitutional: Negative for appetite change, diaphoresis and fatigue.  HENT: Negative for congestion.   Cardiovascular: Positive for chest pain and leg swelling.  Gastrointestinal: Negative for abdominal pain.  Genitourinary: Negative for dysuria.  Musculoskeletal: Negative for back pain.  Neurological: Negative for seizures and weakness.  Hematological: Negative for adenopathy.  Psychiatric/Behavioral: Negative for agitation and confusion.     Physical Exam Updated Vital Signs BP 116/71   Pulse (!) 59   Temp 98.3 F (36.8 C) (Oral)   Resp (!) 9   Ht 5\' 2"  (1.575 m)   Wt 61.2 kg (135 lb)   SpO2 99%   BMI 24.69 kg/m   Physical Exam  Constitutional: She appears well-developed.  Patient appears uncomfortable  HENT:  Head: Normocephalic.  Eyes: Pupils are equal, round, and reactive to light.  Neck: Neck supple.  Cardiovascular: Normal rate and regular rhythm.  No murmur  heard. Pulmonary/Chest: Breath sounds normal. She has no wheezes. She has no rales.  Musculoskeletal:       Right lower leg: She exhibits edema.       Left lower leg: She exhibits edema.  Mild pitting edema bilateral lower extremities  Neurological: She is alert.  Skin: Capillary refill takes less than 2 seconds.     ED Treatments / Results  Labs (all labs ordered are listed, but only abnormal results are displayed) Labs Reviewed  BASIC METABOLIC PANEL - Abnormal; Notable for the following components:      Result Value   GFR calc non Af Amer 56 (*)  All other components within normal limits  CBC  I-STAT TROPONIN, ED  I-STAT TROPONIN, ED    EKG  EKG Interpretation  Date/Time:  Saturday December 12 2017 09:03:01 EST Ventricular Rate:  67 PR Interval:  194 QRS Duration: 86 QT Interval:  378 QTC Calculation: 399 R Axis:   60 Text Interpretation:  Sinus rhythm with occasional Premature ventricular complexes Nonspecific T wave abnormality Abnormal ECG No significant change since last tracing Confirmed by Davonna Belling [phone]) on 12/12/2017 9:23:29 AM Also confirmed by Davonna Belling [phone]), editor Lynder Parents [phone])  on 12/12/2017 10:01:41 AM       Radiology Dg Chest 2 View  Result Date: 12/12/2017 CLINICAL DATA:  Chest pain and weakness. EXAM: CHEST  2 VIEW COMPARISON:  April 11, 2014 FINDINGS: The heart size and mediastinal contours are within normal limits. Both lungs are clear. The visualized skeletal structures are unremarkable. IMPRESSION: No active cardiopulmonary disease. Electronically Signed   By: Dorise Bullion III M.D   On: 12/12/2017 10:14    Procedures Procedures (including critical care time)  Medications Ordered in ED Medications  nitroGLYCERIN (NITROSTAT) SL tablet 0.4 mg (0.4 mg Sublingual Given 12/12/17 1030)  aspirin chewable tablet 324 mg (324 mg Oral Given 12/12/17 1030)     Initial Impression / Assessment and Plan / ED Course  I have  reviewed the triage vital signs and the nursing notes.  Pertinent labs & imaging results that were available during my care of the patient were reviewed by me and considered in my medical decision making (see chart for details).     Patient with chest pain.  Mid to right chest.  EKG overall reassuring.  First troponin negative but did come rather quickly after the onset of pain.  Pain improved somewhat nitroglycerin.  With cardiac history will admit to family practice service.  Has seen Dr. Wynonia Lawman from cardiology in the past.  Final Clinical Impressions(s) / ED Diagnoses   Final diagnoses:  Chest pain, unspecified type    ED Discharge Orders    None       Davonna Belling, MD 12/12/17 1129

## 2017-12-12 NOTE — Consult Note (Signed)
Cardiology Consult Note  Admit date: 12/12/2017 Name: Dana Coleman 77 y.o.  female DOB:  Dec 03, 1940 MRN:  [id number]  Today's date:  12/12/2017  Referring Physician:    Teaching service  Reason for Consultation:  Chest  Pain  IMPRESSIONS: 1.  Prolonged episode of chest pain occurring at rest in a patient with previous CAD possible unstable angina 2.  CAD with moderate LAD disease and previous stent to the RCA 3.  Hypertension 4.  Hyperlipidemia 5.  History of breast cancer  RECOMMENDATION: She has multiple risk factors and last catheterization showed moderate LAD and RCA disease.  Recommended myocardial perfusion scan for further evaluation.  HISTORY: A 77 year old female had a previous history of an inferior infarction 2004 treated with stenting.  She had good recovery of LV function and catheterization in 2015 showed moderate disease in the proximal LAD with distal disease at the apex.  There was a 50% in-stent restenosis is deceased distal in the right coronary artery.she felt somewhat fatigued over the past week and had some mild episodes of nausea.  This morning she was sitting at rest and had the onset of right mid sternal pressure type pain that was more right-sided and was similar to previous pain that she had.  It lasted around an hour and she presented to the emergency room where she was given nitroglycerin were gradually resolved.  She does not have recurrence of pain since that initial enzymes were negative.  She has had some mild dizziness as well as fatigue and lightheadedness.  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  Past Medical History:  Diagnosis Date  . Breast cancer ([place]) 04/2008   Right breast  . Cataracts, bilateral   . Cervical cancer ([place])    When the patient was in her 59s  . Diabetes mellitus without complication ([place])   . Heart attack ([place]) 2004  . 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  Hyperlipidemia   . Hypertension   . Personal history of radiation therapy 2009      Past Surgical History:  Procedure Laterality Date  . BREAST LUMPECTOMY Right 2009  . BREAST SURGERY  2009   right lumpectomy  . CERVIX SURGERY  1980  . LEFT HEART CATHETERIZATION WITH CORONARY ANGIOGRAM N/A 04/20/2014   Procedure: LEFT HEART CATHETERIZATION WITH CORONARY ANGIOGRAM;  Surgeon: Jacolyn Reedy, MD;  Location: [facility] CATH LAB;  Service: Cardiovascular;  Laterality: N/A;  . thyroid     for nodule hemithyroidectomy, benign  . THYROIDECTOMY Right 03/2003     Allergies:  has No Known Allergies.   Medications: Prior to Admission medications   Medication Sig Start Date End Date Taking? Authorizing Provider  amLODipine (NORVASC) 5 MG tablet Take 1 tablet (5 mg total) by mouth daily. 04/20/14  Yes Jacolyn Reedy, MD  aspirin 81 MG chewable tablet Chew 1 tablet (81 mg total) by mouth daily. 06/16/13  Yes Patrecia Pour, MD  cholecalciferol 2000 UNITS tablet Take 2,000 Units by mouth daily.  01/16/11  Yes Katherina Mires, MD  cyclobenzaprine (FLEXERIL) 5 MG tablet Take 1 tablet (5 mg total) by mouth at bedtime as needed for muscle spasms. 10/21/16  Yes Everrett Coombe, MD  glimepiride (AMARYL) 1 MG tablet Take 1 tablet (1 mg total) by mouth daily before breakfast. 09/23/17  Yes Everrett Coombe, MD  HUMALOG KWIKPEN 100 UNIT/ML KiwkPen INJECT 6 UNITS INTO THE SKIN DAILY BEFORE SUPPER  07/17/17  Yes Everrett Coombe, MD  Insulin Glargine (LANTUS SOLOSTAR) 100 UNIT/ML Solostar Pen INJECT 30 UNITS INTO THE SKIN EVERY MORNING. 11/18/17  Yes Everrett Coombe, MD  metFORMIN (GLUCOPHAGE) 500 MG tablet Take 2 tablets (1,000 mg total) by mouth 2 (two) times daily with a meal. Patient taking differently: Take 500 mg by mouth daily with breakfast.  04/07/17  Yes Ronnie Doss M, DO  metoprolol succinate (TOPROL-XL) 25 MG 24 hr tablet Take 25 mg by mouth daily.   Yes [provider]  nitroGLYCERIN (NITROSTAT) 0.4 MG SL tablet  Place 0.4 mg under the tongue every 5 (five) minutes as needed for chest pain.   Yes [provider]  olmesartan-hydrochlorothiazide (BENICAR HCT) 40-12.5 MG tablet Take 1 tablet by mouth daily. 02/26/17  Yes Everrett Coombe, MD  rosuvastatin (CRESTOR) 40 MG tablet TAKE 1 TABLET BY MOUTH AT BEDTIME FOR CHOLESTEROL 04/25/16  Yes Everrett Coombe, MD  Blood Glucose Monitoring Suppl MISC One touch ultra glucose monitor. Check blood sugar twice a day.    [provider]  Insulin Pen Needle (B-D UF III MINI PEN NEEDLES) 31G X 5 MM MISC INJECT INSULIN VIA PEN 6 TIMES DAILY 09/29/17   Everrett Coombe, MD  Lancets MISC 1 each by Other route See admin instructions. Check blood sugar twice daily 08/14/14   Patrecia Pour, MD  ONE TOUCH ULTRA TEST test strip CHECK BLOOD SUGAR TWICE DAILY 11/10/17   Everrett Coombe, MD    Family History: Family Status  Relation Name Status  . Mother  Deceased  . Father  Deceased  . Brother [employer]  . Daughter  Alive  . Sister  Alive  . Son  Alive  . Sister  Alive  . Sister  Alive  . Daughter  Alive  . Daughter  Alive  . Daughter  Alive  . Brother Gwyndolyn Saxon Deceased    Social History:   reports that  has never smoked. she has never used smokeless tobacco. She reports that she does not drink alcohol or use drugs.   Social History   Social History Narrative   Lives alone, separated, no contact with husband.  Has family in [place].  Does light hosue cleaning part time.        Health Care POA:    Emergency Contact: daughter, Solmon Ice [phone]   End of Life Plan: gave pt living will referral info   Who lives with you: self   Any pets: none   Diet: Pt has a varied diet of protein, starch, and vegetables.   Seatbelts: Pt reports wearing seatbelt when in vehicles.    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 Silvano Rusk, RN, BSN  Review of Systems: Other than as noted above remainder of the review of systems is unremarkable.  Physical Exam: BP 127/69 (BP Location: Left Arm)   Pulse 64   Temp 98.4 F (36.9 C) (Oral)   Resp 15   Ht 5\' 2"  (1.575 m)   Wt 61.2 kg (135 lb)   SpO2 100%   BMI 24.69 kg/m   General appearance: pleasant black female in no acute distress Head: Normocephalic, without obvious abnormality, atraumatic Eyes: conjunctivae/corneas clear. PERRL, EOM's intact. Fundi not examined Neck: no adenopathy, no carotid bruit, no JVD and supple, symmetrical, trachea midline Lungs: clear to auscultation bilaterally Heart: regular rate and rhythm, S1, S2 normal, no murmur, click, rub or gallop Abdomen: soft, non-tender; bowel sounds normal; no masses,  no organomegaly Pelvic: deferred Extremities: extremities normal, atraumatic, no cyanosis or edema Pulses: 2+ and symmetric Skin: Skin color, texture, turgor normal. No rashes or lesions Neurologic: Grossly normal Psych: Alert and oriented x 3 Labs: CBC Recent Labs    12/12/17 0926  WBC 9.0  RBC 4.47  HGB 12.9  HCT 40.1  PLT 213  MCV 89.7  MCH 28.9  MCHC 32.2  RDW 15.3   CMP  Recent Labs    12/12/17 0926  NA 140  K 3.9  CL 102  CO2 25  GLUCOSE 89  BUN 13  CREATININE 0.96  CALCIUM 9.8  GFRNONAA 56*  GFRAA >60   BNP (last 3 results) BNP    Component Value Date/Time   BNP 46.2 06/30/2011 1550   Cardiac Panel (last 3 results) Troponin (Point of Care Test) Recent Labs     12/12/17 1140  TROPIPOC 0.00   Cardiac Panel (last 3 results) Recent Labs    12/12/17 1247  TROPONINI <0.03     Radiology:  No sig disease   EKG: Nonspecific ST and T wave changes Independently reviewed by me  Signed:  W. Doristine Church MD [facility]   Cardiology Consultant  12/12/2017, 5:00 PM

## 2017-12-12 NOTE — Progress Notes (Signed)
*  PRELIMINARY RESULTS* Echocardiogram 2D Echocardiogram has been performed.  Dana Coleman 12/12/2017, 4:02 PM

## 2017-12-13 ENCOUNTER — Observation Stay (HOSPITAL_BASED_OUTPATIENT_CLINIC_OR_DEPARTMENT_OTHER): Payer: Medicare Other

## 2017-12-13 DIAGNOSIS — I200 Unstable angina: Secondary | ICD-10-CM

## 2017-12-13 DIAGNOSIS — I2510 Atherosclerotic heart disease of native coronary artery without angina pectoris: Secondary | ICD-10-CM | POA: Diagnosis not present

## 2017-12-13 DIAGNOSIS — R079 Chest pain, unspecified: Secondary | ICD-10-CM

## 2017-12-13 DIAGNOSIS — Z794 Long term (current) use of insulin: Secondary | ICD-10-CM | POA: Diagnosis not present

## 2017-12-13 DIAGNOSIS — E1165 Type 2 diabetes mellitus with hyperglycemia: Secondary | ICD-10-CM | POA: Diagnosis not present

## 2017-12-13 DIAGNOSIS — E119 Type 2 diabetes mellitus without complications: Secondary | ICD-10-CM | POA: Diagnosis not present

## 2017-12-13 DIAGNOSIS — R42 Dizziness and giddiness: Secondary | ICD-10-CM | POA: Diagnosis not present

## 2017-12-13 DIAGNOSIS — I10 Essential (primary) hypertension: Secondary | ICD-10-CM | POA: Diagnosis not present

## 2017-12-13 LAB — BASIC METABOLIC PANEL
Anion gap: 12 (ref 5–15)
BUN: 15 mg/dL (ref 6–20)
CO2: 24 mmol/L (ref 22–32)
CREATININE: 1.01 mg/dL — AB (ref 0.44–1.00)
Calcium: 9 mg/dL (ref 8.9–10.3)
Chloride: 105 mmol/L (ref 101–111)
GFR calc Af Amer: >60 mL/min (ref >60)
GFR, EST NON AFRICAN AMERICAN: 53 mL/min — AB (ref >60)
GLUCOSE: 143 mg/dL — AB (ref 65–99)
POTASSIUM: 3.9 mmol/L (ref 3.5–5.1)
SODIUM: 141 mmol/L (ref 135–145)

## 2017-12-13 LAB — CBC
HEMATOCRIT: 35.7 % — AB (ref 36.0–46.0)
HEMATOCRIT: 33.1 % — AB (ref 36.0–46.0)
HEMOGLOBIN: 11.7 g/dL — AB (ref 12.0–15.0)
Hemoglobin: 10.7 g/dL — ABNORMAL LOW (ref 12.0–15.0)
MCH: 28.9 pg (ref 26.0–34.0)
MCH: 29.5 pg (ref 26.0–34.0)
MCHC: 32.8 g/dL (ref 30.0–36.0)
MCHC: 32.3 g/dL (ref 30.0–36.0)
MCV: 89.9 fL (ref 78.0–100.0)
MCV: 89.5 fL (ref 78.0–100.0)
PLATELETS: 169 10*3/uL (ref 150–400)
Platelets: 187 10*3/uL (ref 150–400)
RBC: 3.7 MIL/uL — ABNORMAL LOW (ref 3.87–5.11)
RBC: 3.97 MIL/uL (ref 3.87–5.11)
RDW: 15.1 % (ref 11.5–15.5)
RDW: 15.4 % (ref 11.5–15.5)
WBC: 6.8 10*3/uL (ref 4.0–10.5)
WBC: 7.7 10*3/uL (ref 4.0–10.5)

## 2017-12-13 LAB — GLUCOSE, CAPILLARY
GLUCOSE-CAPILLARY: 237 mg/dL — AB (ref 65–99)
Glucose-Capillary: 116 mg/dL — ABNORMAL HIGH (ref 65–99)

## 2017-12-13 LAB — LIPID PANEL
Cholesterol: 92 mg/dL (ref 0–200)
HDL: 36 mg/dL — ABNORMAL LOW (ref >40)
LDL CALC: 41 mg/dL (ref 0–99)
Total CHOL/HDL Ratio: 2.6 RATIO
Triglycerides: 74 mg/dL (ref <150)
VLDL: 15 mg/dL (ref 0–40)

## 2017-12-13 LAB — NM MYOCAR MULTI W/SPECT W/WALL MOTION / EF
CHL CUP RESTING HR STRESS: 58 {beats}/min
CSEPED: 0 min
CSEPEDS: 0 s
CSEPHR: 71 %
Estimated workload: 1 METS
MPHR: 145 {beats}/min
Peak HR: 104 {beats}/min

## 2017-12-13 LAB — TROPONIN I: Troponin I: <0.03 ng/mL (ref <0.03)

## 2017-12-13 MED ORDER — METOPROLOL TARTRATE 25 MG PO TABS: 25.0000 mg | ORAL_TABLET | Freq: Two times a day (BID) | ORAL | 0 refills | Status: DC

## 2017-12-13 MED ORDER — TECHNETIUM TC 99M TETROFOSMIN IV KIT
30.0000 | PACK | Freq: Once | INTRAVENOUS | Status: AC | PRN
  Administered 2017-12-13: 30 via INTRAVENOUS

## 2017-12-13 MED ORDER — REGADENOSON 0.4 MG/5ML IV SOLN
0.4000 mg | Freq: Once | INTRAVENOUS | Status: AC
  Administered 2017-12-13: 0.4 mg via INTRAVENOUS
  Filled 2017-12-13: qty 5

## 2017-12-13 MED ORDER — REGADENOSON 0.4 MG/5ML IV SOLN
INTRAVENOUS | Status: AC
  Filled 2017-12-13: qty 5

## 2017-12-13 MED ORDER — METOPROLOL SUCCINATE ER 25 MG PO TB24
25.0000 mg | ORAL_TABLET | Freq: Two times a day (BID) | ORAL | Status: DC
  Administered 2017-12-13: 25 mg via ORAL
  Filled 2017-12-13: qty 1

## 2017-12-13 MED ORDER — METFORMIN HCL 500 MG PO TABS: 500.0000 mg | ORAL_TABLET | Freq: Every day | ORAL | Status: DC

## 2017-12-13 MED ORDER — TECHNETIUM TC 99M TETROFOSMIN IV KIT
10.0000 | PACK | Freq: Once | INTRAVENOUS | Status: AC | PRN
  Administered 2017-12-13: 10 via INTRAVENOUS

## 2017-12-13 NOTE — Discharge Summary (Signed)
[facility] Discharge Summary  Patient name: Dana Coleman record number: [id number] Date of birth: December 13, 1940 Age: 77 y.o. Gender: female Date of Admission: 12/12/2017  Date of Discharge: 12/13/2017 Admitting Physician: Blane Ohara McDiarmid, MD  Primary Care Provider: Everrett Coombe, MD Consultants: Cardiology  Indication for Hospitalization: chest pain, dizziness  Discharge Diagnoses/Problem List:  Active Problems:   Chest pain HTN T2DM HLD Previous MI (2004) with stent  Disposition: Home  Discharge Condition: Improved  Discharge Exam:  Blood pressure 136/70, pulse 66, temperature 98.3 F (36.8 C), temperature source Oral, resp. rate 14, height 5\' 2"  (1.575 m), weight 131 lb 5 oz (59.6 kg), SpO2 95 %. Physical Exam: General:NAD, sitting on edge of bed, comfortable ENTM:MMM Cardiovascular:RRR, no m/r/g, no peripheral edema, no JVD Respiratory:CTA bil, no W/R/R Gastrointestinal:soft, nt, nd KKX:FGHWE 4 extremities equally, no reproducible chest wall pain  Brief Hospital Course:  Patient presented with non-exertional, right-sided chest pain that was associated with light-headedness and dyspnea. Pain was relieved by nitroglycerin in the ED. She had unchanged EKG and negative troponin x 3. Risk factors for ACS include HTN, diabetes, MI in 2004 s/p RCA stent and cardiac catheterization in 2015 with 50% stenosis of proximal LAD. ECHO performed with unchanged EF; EF of 55-60% with moderate LVH and G1DD. She had low risk myoview stress test on 12/13/17 with "mild area of current ischemia." Her cardiologist Dr. Wynonia Lawman recommended increasing metoprolol to 50 mg daily and would add long-acting nitrate if pain were to continue. Patient did have positive orthostatic vital signs (see below), and she notes dizziness with changes in position. She had drop in hemoglobin from 12.9 to 10.7 overnight, but repeat CBC checked prior to discharge was 11.7. Patient  denied dark stools. Given positive orthostatics and plan to increase her beta blocker, stopped amlodipine at discharge.   Orthostatic vital signs: Lying- 142/71, pulse 64 Sitting- 122/70, pulse 68 Standing 0 minutes- 109/70, 79 Standing 3 minutes- 106/70, 80  Issues for Follow Up:  1. Sensation of dizziness. Could consider stopping hctz and just continuing ARB (on benicar) given known orthostatic hypotension if symptoms continue.  2. Tolerability of metoprolol tartrate 25 mg BID--could then switch to succinate if not having worsening dizziness. 3. Ask about CBGs at home, as did not require much correction with sliding scale while in the hospital without lantus restarted (though only on sensitive scale).  4. Consider repeating CBC or getting POC hgb.   Significant Procedures: myoview scan, ECHO  Significant Labs and Imaging:  Recent Labs  Lab 12/12/17 0926 12/13/17 0028 12/13/17 1206  WBC 9.0 6.8 7.7  HGB 12.9 10.7* 11.7*  HCT 40.1 33.1* 35.7*  PLT 213 169 187   Recent Labs  Lab 12/12/17 0926 12/13/17 0028  NA 140 141  K 3.9 3.9  CL 102 105  CO2 25 24  GLUCOSE 89 143*  BUN 13 15  CREATININE 0.96 1.01*  CALCIUM 9.8 9.0   Read of myoview stress test: - There was no ST segment deviation noted during stress. - T wave inversion was noted during stress in the III leads, beginning at 1 minutes of stress. T wave inversion persisted. - Findings consistent with prior inferior myocardial infarction. Small mild distal anterior/apical infarct with mild peri-infarct ischemia. - This is a low risk study. Mild area of current ischemia. - The left ventricular ejection fraction is normal (55-65%).  Results/Tests Pending at Time of Discharge: none  Discharge Medications:  Allergies as of 12/13/2017  No Known Allergies     Medication List    STOP taking these medications   amLODipine 5 MG tablet Commonly known as:  NORVASC   metoprolol succinate 25 MG 24 hr tablet Commonly  known as:  TOPROL-XL     TAKE these medications   aspirin 81 MG chewable tablet Chew 1 tablet (81 mg total) by mouth daily.   Blood Glucose Monitoring Suppl Misc One touch ultra glucose monitor. Check blood sugar twice a day.   cyclobenzaprine 5 MG tablet Commonly known as:  FLEXERIL Take 1 tablet (5 mg total) by mouth at bedtime as needed for muscle spasms.   glimepiride 1 MG tablet Commonly known as:  AMARYL Take 1 tablet (1 mg total) by mouth daily before breakfast.   HUMALOG KWIKPEN 100 UNIT/ML KiwkPen Generic drug:  insulin lispro INJECT 6 UNITS INTO THE SKIN DAILY BEFORE SUPPER   Insulin Glargine 100 UNIT/ML Solostar Pen Commonly known as:  LANTUS SOLOSTAR INJECT 30 UNITS INTO THE SKIN EVERY MORNING.   Insulin Pen Needle 31G X 5 MM Misc Commonly known as:  B-D UF III MINI PEN NEEDLES INJECT INSULIN VIA PEN 6 TIMES DAILY   Lancets Misc 1 each by Other route See admin instructions. Check blood sugar twice daily   metFORMIN 500 MG tablet Commonly known as:  GLUCOPHAGE Take 1 tablet (500 mg total) by mouth daily with breakfast.   metoprolol tartrate 25 MG tablet Commonly known as:  LOPRESSOR Take 1 tablet (25 mg total) by mouth 2 (two) times daily.   nitroGLYCERIN 0.4 MG SL tablet Commonly known as:  NITROSTAT Place 0.4 mg under the tongue every 5 (five) minutes as needed for chest pain.   olmesartan-hydrochlorothiazide 40-12.5 MG tablet Commonly known as:  BENICAR HCT Take 1 tablet by mouth daily.   ONE TOUCH ULTRA TEST test strip Generic drug:  glucose blood CHECK BLOOD SUGAR TWICE DAILY   rosuvastatin 40 MG tablet Commonly known as:  CRESTOR TAKE 1 TABLET BY MOUTH AT BEDTIME FOR CHOLESTEROL   Vitamin D 2000 units tablet Take 2,000 Units by mouth daily.       Discharge Instructions: Please refer to Patient Instructions section of EMR for full details.  Patient was counseled important signs and symptoms that should prompt return to medical care,  changes in medications, dietary instructions, activity restrictions, and follow up appointments.   Follow-Up Appointments: Follow-up Information    Everrett Coombe, MD. Go on 12/23/2017.   Specialty:  Family Medicine Why:  2:10 pm appointment to follow-up blood pressure and hospitalization Contact information: [place] [address] [phone]           Rogue Bussing, MD 12/13/2017, 3:53 PM PGY-3, [place]

## 2017-12-13 NOTE — Progress Notes (Signed)
   Dana Coleman presented for a 1-day Lexiscan cardiolite today.  No immediate complications.  Stress imaging is pending at this time.  Erma Heritage, PA-C 12/13/2017, 8:52 AM

## 2017-12-13 NOTE — Progress Notes (Signed)
Family Medicine Teaching Service Daily Progress Note Intern Pager: [phone]  Patient name: Dana Coleman Medical record number: [id number] Date of birth: 11-Aug-1941 Age: 77 y.o. Gender: female  Primary Care Provider: Everrett Coombe, MD Consultants: Cardiology Code Status: Full  Pt Overview and Major Events to Date:  12/12/17: Admitted for ACS r/o 12/13/17: Low risk myoview scan  Assessment and Plan: [name] is a 77 y.o. female presenting with atypical CP relieved by nitro. PMH is significant for MI (2004) with stent, T2DM, HTN, HLD, thyroid nodule  Atypical Chest pain/ACS rule-out - may be unstable angina. CP came on at rest and was associated with light headedness and dyspnea. CP is atypical as it was right sided, non-exertional, and without associated diaphoresis or nausea. Pain was relieved by nitro. Initial EKG and istat troponin negative x2 in ED. She has significant cardiac risk factors with history of MI in 2004 s/p RCA stent, and most recent cardiac cath in 2015 notable for 50% stenosis proximal LAD. Presentation is not consistent with new onset CHF with no PND, no evidence of volume overload. She does not have productive cough, white count or fevers to think of infectious etiology. Vitals stable without tachycardia, tachypnea or O2 desats and no risk factors to think of PE. - Cardiology consulted, appreciate recommendations: myoview scan performed due to patient's significant cardiac history and read as low risk. Dr. Wynonia Lawman recommends increasing metoprolol to 50 mg daily and adding long-acting nitrates if pain recurs/continues.  - monitor on telemetry - check cardiac echo - LVEF of 50% documented via cardiac cath in 04/2014; ECHO 12/12/17 showed EF 55-60% with moderate LVH and G1DD - risk stratify with lipid panel, TSH --> total cholesterol 92, HDL 36, LDL 41; TSH WNL; A1c 11/17/17 was 7.4 - Continue daily aspirin and crestor 40 mg daily  Light headedness/generalized fatigue -   Vague, uncertain etiology. Describes as harder to get out of bed and motivate herself. Hemoglobin dropped from 12.9 on admission to 10.7 this a.m. Denies dark stools or abdominal pain.  - Recheck CBC - PT/OT - monitor BP, blood sugars - check orthostatics - cardiac workup as above  HTN, controlled  - Normotensive in the ED. Reports she did not take medications this AM due to coming into hospital. Home meds: amlodipine 5 mg daily, Toprol 25 mg daily, olmesartan-HCTZ 40-12/5 daily. Due to complaints of dizziness and fatigue over past 1-2 days, will plan to hold off restarting and add back home meds as BP requires. - increase metoprolol per Dr. Wynonia Lawman  Diabetes, controlled  - Last A1c at goal 7.4 on 11/17/2017. Home meds: metformin 500 mg daily, humalog6u in the evening with dinner, lantus30 mg qAM, glimepiride 1 mg qd. Patient did not take any medications this AM. Random glucose in ED was 89. Due to complaints of fatigue and dizziness, we can hold off restarting home meds, add back as needed to avoid hypoglycemia. - sSSI - add back home meds as needed/add lantus as needed  HLD - Well controlled. Home med: Crestor 40 mg daily.  - continue home statin  Hx thyroid nodule - s/p resection 2004 - TSH and T4 WNL  FEN/GI: heart healthy/carb modified Prophylaxis: lovenox  Disposition: Pending repeat CBC  Subjective:  Patient denies current chest pain. She says she had some soreness overnight, may be worse with twisting motions. Still feels more fatigued than usual.   Objective: Temp:  [98.2 F (36.8 C)-98.4 F (36.9 C)] 98.3 F (36.8 C) (03/03 0546) Pulse  Rate:  [58-70] 58 (03/02 2110) Resp:  [10-17] 14 (03/03 0546) BP: (110-154)/(62-92) 121/72 (03/03 0546) SpO2:  [95 %-100 %] 95 % (03/03 0546) Weight:  [131 lb 5 oz (59.6 kg)-135 lb (61.2 kg)] 131 lb 5 oz (59.6 kg) (03/03 0500) Physical Exam: General: NAD, sitting on edge of bed, comfortable ENTM: MMM Cardiovascular: RRR, no  m/r/g, no peripheral edema, no JVD Respiratory: CTA bil, no W/R/R Gastrointestinal: soft, nt, nd MSK: moves 4 extremities equally, no reproducible chest wall pain  Laboratory: Recent Labs  Lab 12/12/17 0926 12/13/17 0028  WBC 9.0 6.8  HGB 12.9 10.7*  HCT 40.1 33.1*  PLT 213 169   Recent Labs  Lab 12/12/17 0926 12/13/17 0028  NA 140 141  K 3.9 3.9  CL 102 105  CO2 25 24  BUN 13 15  CREATININE 0.96 1.01*  CALCIUM 9.8 9.0  GLUCOSE 89 143*    Imaging/Diagnostic Tests: Dg Chest 2 View  Result Date: 12/12/2017 CLINICAL DATA:  Chest pain and weakness. EXAM: CHEST  2 VIEW COMPARISON:  April 11, 2014 FINDINGS: The heart size and mediastinal contours are within normal limits. Both lungs are clear. The visualized skeletal structures are unremarkable. IMPRESSION: No active cardiopulmonary disease. Electronically Signed   By: Dorise Bullion III M.D   On: 12/12/2017 10:14    Rogue Bussing, MD 12/13/2017, 8:39 AM PGY-3, [place] Intern pager: [phone], text pages welcome

## 2017-12-13 NOTE — Care Management Obs Status (Signed)
[facility] NOTIFICATION   Patient Details  Name: LILLIS NUTTLE MRN: [id number] Date of Birth: [birth year]   Medicare Observation Status Notification Given:  Yes    Carles Collet, RN 12/13/2017, 10:19 AM

## 2017-12-13 NOTE — Evaluation (Signed)
Physical Therapy Evaluation Patient Details Name: Dana Coleman MRN: [id number] DOB: 07/22/1941 Today's Date: 12/13/2017   History of Present Illness    77 y.o.femalepresenting with atypical CP relieved by nitro. PMH is significant for MI (2004) with stent, T2DM, HTN, HLD, thyroid nodule    Clinical Impression  Pt presents at/near baseline level of function, with some minor instability walking likely due to acute medical issues and expect to resolve as medical issues resolve.  No PT needs, encourage daily mobility with nursing supervision. No followup at this time.    Follow Up Recommendations No PT follow up    Equipment Recommendations  None recommended by PT    Recommendations for Other Services       Precautions / Restrictions Precautions Precautions: None      Mobility  Bed Mobility Overal bed mobility: Independent                Transfers Overall transfer level: Independent                  Ambulation/Gait Ambulation/Gait assistance: Independent Ambulation Distance (Feet): 300 Feet Assistive device: None Gait Pattern/deviations: Step-through pattern   Gait velocity interpretation: at or above normal speed for age/gender General Gait Details: initially reported feeling unsteady and needing to hold rail, but over time more surefooted  Stairs Stairs: Yes   Stair Management: One rail Left;Forwards Number of Stairs: 2 General stair comments: no problems, still prefers use of rail for security  Wheelchair Mobility    Modified Rankin (Stroke Patients Only)       Balance Overall balance assessment: Independent                               Standardized Balance Assessment Standardized Balance Assessment : Dynamic Gait Index   Dynamic Gait Index Level Surface: Normal Change in Gait Speed: Normal Gait with Horizontal Head Turns: Mild Impairment Gait with Vertical Head Turns: Mild Impairment Gait and Pivot Turn:  Normal Step Over Obstacle: Normal Step Around Obstacles: Normal Steps: Mild Impairment Total Score: 21       Pertinent Vitals/Pain Pain Assessment: No/denies pain  BP after 60 feet walking = 144/82; after 250 feet and 2 min rest = 133/67    Home Living Family/patient expects to be discharged to:: Private residence Living Arrangements: Alone Available Help at Discharge: Family;Available PRN/intermittently Type of Home: House Home Access: Stairs to enter Entrance Stairs-Rails: Left Entrance Stairs-Number of Steps: 2 Home Layout: One level Home Equipment: None      Prior Function Level of Independence: Independent         Comments: still actively employed as housekeeper     Journalist, newspaper        Extremity/Trunk Assessment   Upper Extremity Assessment Upper Extremity Assessment: Overall WFL for tasks assessed    Lower Extremity Assessment Lower Extremity Assessment: Overall WFL for tasks assessed    Cervical / Trunk Assessment Cervical / Trunk Assessment: Normal  Communication   Communication: No difficulties  Cognition Arousal/Alertness: Awake/alert Behavior During Therapy: WFL for tasks assessed/performed Overall Cognitive Status: Within Functional Limits for tasks assessed                                        General Comments      Exercises     Assessment/Plan    PT  Assessment Patent does not need any further PT services  PT Problem List         PT Treatment Interventions      PT Goals (Current goals can be found in the Care Plan section)  Acute Rehab PT Goals Patient Stated Goal: go back to work PT Goal Formulation: All assessment and education complete, DC therapy    Frequency     Barriers to discharge        Co-evaluation               AM-PAC PT "6 Clicks" Daily Activity  Outcome Measure Difficulty turning over in bed (including adjusting bedclothes, sheets and blankets)?: None Difficulty moving from  lying on back to sitting on the side of the bed? : None Difficulty sitting down on and standing up from a chair with arms (e.g., wheelchair, bedside commode, etc,.)?: None Help needed moving to and from a bed to chair (including a wheelchair)?: None Help needed walking in hospital room?: None Help needed climbing 3-5 steps with a railing? : A Little 6 Click Score: 23    End of Session   Activity Tolerance: Patient tolerated treatment well Patient left: in bed;with family/visitor present Nurse Communication: Mobility status PT Visit Diagnosis: Unsteadiness on feet (R26.81)    Time: 7939-0300 PT Time Calculation (min) (ACUTE ONLY): 21 min   Charges:   PT Evaluation $PT Eval Low Complexity: 1 Low PT Treatments $Physical Performance Test: 8-22 mins   PT G Codes:        Kearney Hard, PT, DPT, MS Board Certified Geriatric Clinical Specialist  Dana Coleman 12/13/2017, 3:19 PM

## 2017-12-13 NOTE — Discharge Instructions (Signed)
Orthostatic Hypotension Orthostatic hypotension is a sudden drop in blood pressure that happens when you quickly change positions, such as when you get up from a seated or lying position. Blood pressure is a measurement of how strongly, or weakly, your blood is pressing against the walls of your arteries. Arteries are blood vessels that carry blood from your heart throughout your body. When blood pressure is too low, you may not get enough blood to your brain or to the rest of your organs. This can cause weakness, light-headedness, rapid heartbeat, and fainting. This can last for just a few seconds or for up to a few minutes. Orthostatic hypotension is usually not a serious problem. However, if it happens frequently or gets worse, it may be a sign of something more serious. What are the causes? This condition may be caused by:  Sudden changes in posture, such as standing up quickly after you have been sitting or lying down.  Blood loss.  Loss of body fluids (dehydration).  Heart problems.  Hormone (endocrine) problems.  Pregnancy.  Severe infection.  Lack of certain nutrients.  Severe allergic reactions (anaphylaxis).  Certain medicines, such as blood pressure medicine or medicines that make the body lose excess fluids (diuretics). Sometimes, this condition can be caused by not taking medicine as directed, such as taking too much of a certain medicine.  What increases the risk? Certain factors can make you more likely to develop orthostatic hypotension, including:  Age. Risk increases as you get older.  Conditions that affect the heart or the central nervous system.  Taking certain medicines, such as blood pressure medicine or diuretics.  Being pregnant.  What are the signs or symptoms? Symptoms of this condition may include:  Weakness.  Light-headedness.  Dizziness.  Blurred vision.  Fatigue.  Rapid heartbeat.  Fainting, in severe cases.  How is this  diagnosed? This condition is diagnosed based on:  Your medical history.  Your symptoms.  Your blood pressure measurement. Your health care provider will check your blood pressure when you are: ? Lying down. ? Sitting. ? Standing.  A blood pressure reading is recorded as two numbers, such as "120 over 80" (or 120/80). The first ("top") number is called the systolic pressure. It is a measure of the pressure in your arteries as your heart beats. The second ("bottom") number is called the diastolic pressure. It is a measure of the pressure in your arteries when your heart relaxes between beats. Blood pressure is measured in a unit called mm Hg. Healthy blood pressure for adults is 120/80. If your blood pressure is below 90/60, you may be diagnosed with hypotension. Other information or tests that may be used to diagnose orthostatic hypotension include:  Your other vital signs, such as your heart rate and temperature.  Blood tests.  Tilt table test. For this test, you will be safely secured to a table that moves you from a lying position to an upright position. Your heart rhythm and blood pressure will be monitored during the test.  How is this treated? Treatment for this condition may include:  Changing your diet. This may involve eating more salt (sodium) or drinking more water.  Taking medicines to raise your blood pressure.  Changing the dosage of certain medicines you are taking that might be lowering your blood pressure.  Wearing compression stockings. These stockings help to prevent blood clots and reduce swelling in your legs.  In some cases, you may need to go to the hospital for:    Fluid replacement. This means you will receive fluids through an IV tube.  Blood replacement. This means you will receive donated blood through an IV tube (transfusion).  Treating an infection or heart problems, if this applies.  Monitoring. You may need to be monitored while medicines that you  are taking wear off.  Follow these instructions at home: Eating and drinking   Drink enough fluid to keep your urine clear or pale yellow.  Eat a healthy diet and follow instructions from your health care provider about eating or drinking restrictions. A healthy diet includes: ? Fresh fruits and vegetables. ? Whole grains. ? Lean meats. ? Low-fat dairy products.  Eat extra salt only as directed. Do not add extra salt to your diet unless your health care provider told you to do that.  Eat frequent, small meals.  Avoid standing up suddenly after eating. Medicines  Take over-the-counter and prescription medicines only as told by your health care provider. ? Follow instructions from your health care provider about changing the dosage of your current medicines, if this applies. ? Do not stop or adjust any of your medicines on your own. General instructions  Wear compression stockings as told by your health care provider.  Get up slowly from lying down or sitting positions. This gives your blood pressure a chance to adjust.  Avoid hot showers and excessive heat as directed by your health care provider.  Return to your normal activities as told by your health care provider. Ask your health care provider what activities are safe for you.  Do not use any products that contain nicotine or tobacco, such as cigarettes and e-cigarettes. If you need help quitting, ask your health care provider.  Keep all follow-up visits as told by your health care provider. This is important. Contact a health care provider if:  You vomit.  You have diarrhea.  You have a fever for more than 2-3 days.  You feel more thirsty than usual.  You feel weak and tired. Get help right away if:  You have chest pain.  You have a fast or irregular heartbeat.  You develop numbness in any part of your body.  You cannot move your arms or your legs.  You have trouble speaking.  You become sweaty or feel  lightheaded.  You faint.  You feel short of breath.  You have trouble staying awake.  You feel confused. This information is not intended to replace advice given to you by your health care provider. Make sure you discuss any questions you have with your health care provider. Document Released: 09/19/2002 Document Revised: 06/17/2016 Document Reviewed: 03/21/2016 Elsevier Interactive Patient Education  2018 Elsevier Inc.  

## 2017-12-13 NOTE — Progress Notes (Signed)
Subjective:  No recurrent chest pain but no drop in hemoglobin overnight.  Myocardial perfusion scan was low risk this morning.  Objective:  Vital Signs in the last 24 hours: BP 136/70   Pulse (!) 58   Temp 98.3 F (36.8 C) (Oral)   Resp 14   Ht 5\' 2"  (1.575 m)   Wt 59.6 kg (131 lb 5 oz)   SpO2 95%   BMI 24.02 kg/m   Physical Exam: Pleasant black female in no acute distress Lungs:  Clear Cardiac:  Regular rhythm, normal S1 and S2, no S3 Abdomen:  Soft, nontender, no masses Extremities:  No edema present  Intake/Output from previous day: 03/02 0701 - 03/03 0700 In: 240 [P.O.:240] Out: -   Weight Filed Weights   12/12/17 0901 12/13/17 0500  Weight: 61.2 kg (135 lb) 59.6 kg (131 lb 5 oz)    Lab Results: Basic Metabolic Panel: Recent Labs    12/12/17 0926 12/13/17 0028  NA 140 141  K 3.9 3.9  CL 102 105  CO2 25 24  GLUCOSE 89 143*  BUN 13 15  CREATININE 0.96 1.01*   CBC: Recent Labs    12/12/17 0926 12/13/17 0028  WBC 9.0 6.8  HGB 12.9 10.7*  HCT 40.1 33.1*  MCV 89.7 89.5  PLT 213 169   Cardiac Enzymes: Troponin (Point of Care Test) Recent Labs    12/12/17 1140  TROPIPOC 0.00   Cardiac Panel (last 3 results) Recent Labs    12/12/17 1247 12/12/17 1847 12/13/17 0028  TROPONINI <0.03 <0.03 <0.03    Telemetry: Sinus rhythm  Assessment/Plan:  1.  CAD 2 vessel with some distal disease in the LAD 2.  Diabetes mellitus 3.  Hypertensive heart disease  Recommendations:  She had a drop in her hemoglobin and this will need to be evaluated.  I would increase her metoprolol to 50 mg daily.  If she continues to have chest pain we could add long-acting nitrates but she is pain-free this morning.     Kerry Hough  MD [facility]  12/13/2017, 11:19 AM

## 2017-12-22 DIAGNOSIS — I2510 Atherosclerotic heart disease of native coronary artery without angina pectoris: Secondary | ICD-10-CM | POA: Diagnosis not present

## 2017-12-22 DIAGNOSIS — E119 Type 2 diabetes mellitus without complications: Secondary | ICD-10-CM | POA: Diagnosis not present

## 2017-12-22 DIAGNOSIS — I10 Essential (primary) hypertension: Secondary | ICD-10-CM | POA: Diagnosis not present

## 2017-12-22 DIAGNOSIS — E785 Hyperlipidemia, unspecified: Secondary | ICD-10-CM | POA: Diagnosis not present

## 2017-12-23 ENCOUNTER — Ambulatory Visit (INDEPENDENT_AMBULATORY_CARE_PROVIDER_SITE_OTHER): Payer: Medicare Other | Admitting: Student in an Organized Health Care Education/Training Program

## 2017-12-23 ENCOUNTER — Encounter: Payer: Self-pay | Admitting: Student in an Organized Health Care Education/Training Program

## 2017-12-23 ENCOUNTER — Other Ambulatory Visit: Payer: Self-pay

## 2017-12-23 DIAGNOSIS — I10 Essential (primary) hypertension: Secondary | ICD-10-CM | POA: Diagnosis not present

## 2017-12-23 DIAGNOSIS — Z794 Long term (current) use of insulin: Secondary | ICD-10-CM

## 2017-12-23 DIAGNOSIS — D649 Anemia, unspecified: Secondary | ICD-10-CM | POA: Diagnosis not present

## 2017-12-23 DIAGNOSIS — E1165 Type 2 diabetes mellitus with hyperglycemia: Secondary | ICD-10-CM | POA: Diagnosis not present

## 2017-12-23 DIAGNOSIS — IMO0002 Reserved for concepts with insufficient information to code with codable children: Secondary | ICD-10-CM

## 2017-12-23 LAB — POCT HEMOGLOBIN: Hemoglobin: 11.6 g/dL — AB (ref 12.2–16.2)

## 2017-12-23 MED ORDER — OLMESARTAN MEDOXOMIL 40 MG PO TABS: 40.0000 mg | ORAL_TABLET | Freq: Every day | ORAL | 11 refills | Status: DC

## 2017-12-23 NOTE — Patient Instructions (Addendum)
It was a pleasure seeing you today in our clinic. Here is the treatment plan we have discussed and agreed upon together:  STOP your olmesartan-HCTZ combination pill. START taking the olmesartan pill I sent to your pharmacy. Please check your blood pressures at home and take note of your dizziness. Give me a call in 1-2 weeks to let me know if this change helps your dizziness.  We drew blood work at today's visit. I will call or send you a letter with these results. If you do not hear from me within the next week, please give our office a call.  Sign up for My Chart to have easy access to your labs results, and communication with your primary care physician.  Our clinic's number is [phone]. Please call with questions or concerns about what we discussed today.  Be well, Dr. Burr Medico

## 2017-12-23 NOTE — Progress Notes (Signed)
  HPI:  Dana Coleman presents for hospital follow up. Patient was hospitalized from 12/12/2017 to 12/13/2017 with CP for ACS r/o. She was ruled out for heart attack while hospitalized and also evaluated by cardiology. During her hospitalization she complained of dizziness and found to be orthostatic and so amlodipine was discontinued.    Dizziness/Orthostatic hypotension She has followed up with cardiology, who agreed with discontinuing amlodipine. Her metoprolol 25 tartrate BID was switched back to 50 mg succinate at her cardiology follow up per patient report. She continues to have episodic dizziness, and does note that she continues to take her olmesartan-HCTZ as well. She does not check BP at home.  Blood sugars/Diabetes Patient was noted to require lower than expected correction sliding scale without lantus during her hospitalization. Her previous doses of lantus, metformin, sliding scale and glimepride were restarted at hospital discharge. Blood sugars have been controlled at 102-125 at home. She reports that she has not experienced any low blood sugars.   Anemia  Noted in the hospital, Hgb was mildly low at 10.7 prior to hospital discharge. May have been iatrogenic. She has not been having any melena or hematochezia, no abdominal pain or palpitations, no dyspnea. She is agreeable to rechecking Hgb at today's visit.  ROS: See HPI.  [place]: Reviewed  PHYSICAL EXAM: BP 102/62   Pulse 60   Temp 98 F (36.7 C) (Oral)   Ht 5\' 2"  (1.575 m)   Wt 139 lb (63 kg)   SpO2 96%   BMI 25.42 kg/m  Gen: NAD, comfortable, appropriate HEENT: Dana Coleman/AT, EOMI Heart: RRR, no m/r/g Lungs: CTA bil, no W/R/R Neuro: CN II-XII grossly intact Ext: no gross deformity  ASSESSMENT/PLAN:  Hypertension Continues to have dizzy spells. Was orthostatic in the hospital. Norvasc stopped by cardiology yesterday, however BP still on the low side at 102/62 today. Takes metoprolol 50 mg succinate per cards. Takes BP  Combo pill (olmesartan-HCTZ 40-12.5mg ) - agree with DC amlodipine - DC Benicar combo >> Split to just olmsesartan for ARB benefits. She does not seem to need the HCTZ for BP control at this time - follow up dizziness/BP in 2 weeks, patient giving the option of following BP and dizziness at home and calling in to give [place] a report vs coming in for nurse check.  Anemia Noted in hospital, mild. Asymptomatic. Now resolved with POC Hgb today resulted at 11.7.   Insulin dependent type 2 diabetes mellitus, uncontrolled ([place]) Continue current insulin regimen given that patient has had good control at home and no low blood sugars. Plan to review when patient is due for next A1c. If she is over-controlled we may be able to decrease her regimen accordingly.  Everrett Coombe, MD, [place]

## 2017-12-23 NOTE — Assessment & Plan Note (Addendum)
Continues to have dizzy spells. Was orthostatic in the hospital. Norvasc stopped by cardiology yesterday, however BP still on the low side at 102/62 today. Takes metoprolol 50 mg succinate per cards. Takes BP Combo pill (olmesartan-HCTZ 40-12.5mg ) - agree with DC amlodipine - DC Benicar combo >> Split to just olmsesartan for ARB benefits. She does not seem to need the HCTZ for BP control at this time - follow up dizziness/BP in 2 weeks, patient giving the option of following BP and dizziness at home and calling in to give [place] a report vs coming in for nurse check.

## 2017-12-23 NOTE — Assessment & Plan Note (Addendum)
Noted in hospital, mild. Asymptomatic. Now resolved with POC Hgb today resulted at 11.7.

## 2017-12-24 ENCOUNTER — Encounter: Payer: Self-pay | Admitting: Student in an Organized Health Care Education/Training Program

## 2017-12-24 NOTE — Assessment & Plan Note (Signed)
Continue current insulin regimen given that patient has had good control at home and no low blood sugars. Plan to review when patient is due for next A1c. If she is over-controlled we may be able to decrease her regimen accordingly.

## 2017-12-28 ENCOUNTER — Other Ambulatory Visit: Payer: Self-pay

## 2017-12-28 NOTE — Telephone Encounter (Signed)
Pharmacy states Olmesartan on backorder. Please send alternative. Danley Danker, RN [facility] RN)

## 2017-12-30 ENCOUNTER — Other Ambulatory Visit: Payer: Self-pay | Admitting: Student in an Organized Health Care Education/Training Program

## 2017-12-30 DIAGNOSIS — I10 Essential (primary) hypertension: Secondary | ICD-10-CM

## 2017-12-30 MED ORDER — GLUCOSE BLOOD VI STRP
ORAL_STRIP | 0 refills | Status: DC
Start: 2017-12-30 — End: 2018-03-28

## 2017-12-30 MED ORDER — LOSARTAN POTASSIUM 25 MG PO TABS: 25.0000 mg | ORAL_TABLET | Freq: Every day | ORAL | 0 refills | Status: DC

## 2017-12-30 NOTE — Telephone Encounter (Signed)
We had to change the patient's ARB (I sent losartan to her pharmacy since olmesartan was back-ordered) Please call and have her schedule appt to be seen for BP check in 1-2 weeks after she gets this new medication.  Thanks, Everrett Coombe, MD

## 2017-12-31 NOTE — Telephone Encounter (Signed)
lmovm for pt to return call. Starlett Pehrson Dawn, CMA  

## 2018-01-01 NOTE — Telephone Encounter (Signed)
Pt informed and appt made. Fleeger, Jessica Dawn, CMA  

## 2018-01-06 ENCOUNTER — Other Ambulatory Visit: Payer: Self-pay | Admitting: Internal Medicine

## 2018-01-07 ENCOUNTER — Other Ambulatory Visit: Payer: Self-pay | Admitting: Student in an Organized Health Care Education/Training Program

## 2018-01-07 DIAGNOSIS — Z794 Long term (current) use of insulin: Principal | ICD-10-CM

## 2018-01-07 DIAGNOSIS — E119 Type 2 diabetes mellitus without complications: Secondary | ICD-10-CM

## 2018-01-12 ENCOUNTER — Ambulatory Visit: Payer: Medicare Other

## 2018-01-12 DIAGNOSIS — I10 Essential (primary) hypertension: Secondary | ICD-10-CM

## 2018-01-12 NOTE — Progress Notes (Signed)
Patient here today for BP check.     BP today is 130/84. Checked BP in left arm with regular cuff.  Symptoms present: none. Patient last took BP med 0845 today.  Routed note to PCP.  Wallace Cullens, RN

## 2018-01-22 ENCOUNTER — Other Ambulatory Visit: Payer: Self-pay | Admitting: Student in an Organized Health Care Education/Training Program

## 2018-01-22 DIAGNOSIS — I10 Essential (primary) hypertension: Secondary | ICD-10-CM

## 2018-01-26 DIAGNOSIS — E119 Type 2 diabetes mellitus without complications: Secondary | ICD-10-CM | POA: Diagnosis not present

## 2018-01-26 DIAGNOSIS — H52203 Unspecified astigmatism, bilateral: Secondary | ICD-10-CM | POA: Diagnosis not present

## 2018-01-26 LAB — HM DIABETES EYE EXAM

## 2018-02-21 ENCOUNTER — Other Ambulatory Visit: Payer: Self-pay | Admitting: Student in an Organized Health Care Education/Training Program

## 2018-02-21 DIAGNOSIS — Z794 Long term (current) use of insulin: Principal | ICD-10-CM

## 2018-02-21 DIAGNOSIS — IMO0002 Reserved for concepts with insufficient information to code with codable children: Secondary | ICD-10-CM

## 2018-02-21 DIAGNOSIS — E1165 Type 2 diabetes mellitus with hyperglycemia: Secondary | ICD-10-CM

## 2018-02-25 ENCOUNTER — Other Ambulatory Visit: Payer: Self-pay | Admitting: Student in an Organized Health Care Education/Training Program

## 2018-02-25 DIAGNOSIS — H2511 Age-related nuclear cataract, right eye: Secondary | ICD-10-CM | POA: Diagnosis not present

## 2018-02-25 DIAGNOSIS — I10 Essential (primary) hypertension: Secondary | ICD-10-CM

## 2018-02-25 DIAGNOSIS — H25811 Combined forms of age-related cataract, right eye: Secondary | ICD-10-CM | POA: Diagnosis not present

## 2018-03-23 DIAGNOSIS — E785 Hyperlipidemia, unspecified: Secondary | ICD-10-CM | POA: Diagnosis not present

## 2018-03-23 DIAGNOSIS — I2510 Atherosclerotic heart disease of native coronary artery without angina pectoris: Secondary | ICD-10-CM | POA: Diagnosis not present

## 2018-03-23 DIAGNOSIS — I10 Essential (primary) hypertension: Secondary | ICD-10-CM | POA: Diagnosis not present

## 2018-03-23 DIAGNOSIS — I252 Old myocardial infarction: Secondary | ICD-10-CM | POA: Diagnosis not present

## 2018-03-28 ENCOUNTER — Other Ambulatory Visit: Payer: Self-pay | Admitting: Student in an Organized Health Care Education/Training Program

## 2018-04-08 ENCOUNTER — Other Ambulatory Visit: Payer: Self-pay | Admitting: Student in an Organized Health Care Education/Training Program

## 2018-04-08 DIAGNOSIS — E119 Type 2 diabetes mellitus without complications: Secondary | ICD-10-CM

## 2018-04-08 DIAGNOSIS — Z794 Long term (current) use of insulin: Principal | ICD-10-CM

## 2018-04-08 NOTE — Telephone Encounter (Signed)
Patient is due to come back and have A1C rechecked, please have her come in. Thanks!

## 2018-05-05 ENCOUNTER — Other Ambulatory Visit: Payer: Self-pay | Admitting: Student in an Organized Health Care Education/Training Program

## 2018-05-05 ENCOUNTER — Other Ambulatory Visit: Payer: Self-pay | Admitting: Family Medicine

## 2018-05-05 DIAGNOSIS — Z1231 Encounter for screening mammogram for malignant neoplasm of breast: Secondary | ICD-10-CM

## 2018-05-10 ENCOUNTER — Other Ambulatory Visit: Payer: Self-pay

## 2018-05-10 ENCOUNTER — Encounter: Payer: Self-pay | Admitting: Student in an Organized Health Care Education/Training Program

## 2018-05-10 ENCOUNTER — Ambulatory Visit (INDEPENDENT_AMBULATORY_CARE_PROVIDER_SITE_OTHER): Payer: Medicare Other | Admitting: Student in an Organized Health Care Education/Training Program

## 2018-05-10 VITALS — BP 130/72 | HR 64 | Temp 98.3°F | Ht 62.0 in | Wt 136.4 lb

## 2018-05-10 DIAGNOSIS — E1165 Type 2 diabetes mellitus with hyperglycemia: Secondary | ICD-10-CM

## 2018-05-10 DIAGNOSIS — Z794 Long term (current) use of insulin: Secondary | ICD-10-CM

## 2018-05-10 DIAGNOSIS — I10 Essential (primary) hypertension: Secondary | ICD-10-CM

## 2018-05-10 DIAGNOSIS — E785 Hyperlipidemia, unspecified: Secondary | ICD-10-CM

## 2018-05-10 DIAGNOSIS — IMO0002 Reserved for concepts with insufficient information to code with codable children: Secondary | ICD-10-CM

## 2018-05-10 LAB — POCT GLYCOSYLATED HEMOGLOBIN (HGB A1C): HbA1c, POC (controlled diabetic range): 6.8 % (ref 0.0–7.0)

## 2018-05-10 NOTE — Patient Instructions (Signed)
It was a pleasure seeing you today in our clinic.  Diabetes Your diabetes is well controlled Your goal is to have an A1c < 7.5 Medicine Changes: none Homework: continue your diet! Congratulations, your hard work has paid off!   Our clinic's number is [phone]. Please call with questions or concerns about what we discussed today.  Be well, Dr. Burr Medico

## 2018-05-10 NOTE — Progress Notes (Signed)
CC: Diabetes follow up  HPI: Tunisha W Hanlon is a 77 y.o. female   HYPERTENSION Disease Monitoring: BP well controlled today  Chest pain, palpitations- denies       Dyspnea- denies Medications: norvasc 5 mg daily, metoprolol succ 50 mg daily, losartan 25 mg daily Compliance- 100% reported  Lightheadedness,Syncope- denies    Edema- denies  DIABETES Disease Monitoring: HgbA1c improved at 7.4 f rom 8.7 on previous tests Blood Sugar ranges -  120 or less Polyuria/phagia/dipsia- denies       Visual problems- recent diabetic eye exam Medications: Lantus 30u qAM, Humalog 6u daily qPM, Metformin 500 mg daily, glimepiride 1 mg daily - She is on ASA and an ARB and statin Compliance- 100% reported  Hypoglycemic symptoms- denies  HYPERLIPIDEMIA Disease Monitoring: last lipid panel less than 1 year ago See symptoms for Hypertension Medications: crestor 40 mg daily Compliance- 100% reported  Right upper quadrant pain- denies   Muscle aches- denies  Monitoring Labs and Parameters Last A1C:  Lab Results  Component Value Date   HGBA1C 6.8 05/10/2018    Last Lipid:     Component Value Date/Time   CHOL 92 12/13/2017 0028   CHOL 142 10/24/2016 0000   HDL 36 (L) 12/13/2017 0028   HDL 60 10/24/2016 0000   LDLDIRECT 58 07/30/2012 1438    Last Bmet  Potassium  Date Value Ref Range Status  12/13/2017 3.9 3.5 - 5.1 mmol/L Final  08/15/2013 3.9 3.5 - 5.1 mEq/L Final   Sodium  Date Value Ref Range Status  12/13/2017 141 135 - 145 mmol/L Final  10/24/2016 145 (H) 134 - 144 mmol/L Final  08/15/2013 142 136 - 145 mEq/L Final   Creatinine  Date Value Ref Range Status  08/15/2013 0.9 0.6 - 1.1 mg/dL Final   Creat  Date Value Ref Range Status  12/08/2014 0.91 0.50 - 1.10 mg/dL Final   Creatinine, Ser  Date Value Ref Range Status  12/13/2017 1.01 (H) 0.44 - 1.00 mg/dL Final      Last BPs:  BP Readings from Last 3 Encounters:  05/10/18 130/72  12/23/17 102/62    12/13/17 136/70    Review of Symptoms:  See HPI for ROS.   CC, SH/smoking status, and VS noted.  Objective: BP 130/72   Pulse 64   Temp 98.3 F (36.8 C) (Oral)   Ht 5\' 2"  (1.575 m)   Wt 136 lb 6.4 oz (61.9 kg)   SpO2 96%   BMI 24.95 kg/m  GEN: NAD, alert, cooperative, and pleasant. RESPIRATORY: clear to auscultation bilaterally with no wheezes, rhonchi or rales, good effort CV: RRR, no m/r/g, no peripheral edema GI: soft, non-tender, non-distended SKIN: warm and dry, no rashes or lesions NEURO: II-XII grossly intact, normal gait, peripheral sensation intact PSYCH: AAOx3, appropriate affect  Assessment and plan:  Hypertension Well controlled No red flag symptoms Continue current management Plan to recheck BMP for creatinine in one year  Hyperlipidemia Controlled on statin medication. Can recheck lipid panel for disease monitoring in one year. Was last checked 12/2017  Insulin dependent type 2 diabetes mellitus, uncontrolled ([facility]) A1c 7.4, goal is <7.0 for this 77 year old patient. No low glucoses. If her A1c continues to improve we could potentially start to gradually decrease insulin at next visit.  - continue current management for now    Orders Placed This Encounter  Procedures  . POCT glycosylated hemoglobin (Hb A1C)    No orders of the defined types were placed in  this encounter.    Everrett Coombe, MD,MS,  PGY3 05/10/2018 4:34 PM

## 2018-05-10 NOTE — Assessment & Plan Note (Signed)
Controlled on statin medication. Can recheck lipid panel for disease monitoring in one year. Was last checked 12/2017

## 2018-05-10 NOTE — Assessment & Plan Note (Signed)
Well controlled No red flag symptoms Continue current management Plan to recheck BMP for creatinine in one year

## 2018-05-10 NOTE — Assessment & Plan Note (Signed)
A1c 7.4, goal is <7.0 for this 77 year old patient. No low glucoses. If her A1c continues to improve we could potentially start to gradually decrease insulin at next visit.  - continue current management for now

## 2018-05-27 ENCOUNTER — Other Ambulatory Visit: Payer: Self-pay | Admitting: Student in an Organized Health Care Education/Training Program

## 2018-05-27 ENCOUNTER — Other Ambulatory Visit: Payer: Self-pay | Admitting: Family Medicine

## 2018-05-27 DIAGNOSIS — Z794 Long term (current) use of insulin: Principal | ICD-10-CM

## 2018-05-27 DIAGNOSIS — I10 Essential (primary) hypertension: Secondary | ICD-10-CM

## 2018-05-27 DIAGNOSIS — E119 Type 2 diabetes mellitus without complications: Secondary | ICD-10-CM

## 2018-06-03 ENCOUNTER — Ambulatory Visit
Admission: RE | Admit: 2018-06-03 | Discharge: 2018-06-03 | Disposition: A | Discharge status: Home / Self Care | Payer: Medicare Other | Source: Ambulatory Visit | Attending: Family Medicine | Admitting: Family Medicine

## 2018-06-03 DIAGNOSIS — Z1231 Encounter for screening mammogram for malignant neoplasm of breast: Secondary | ICD-10-CM | POA: Diagnosis not present

## 2018-07-15 ENCOUNTER — Ambulatory Visit (INDEPENDENT_AMBULATORY_CARE_PROVIDER_SITE_OTHER): Payer: Medicare Other

## 2018-07-15 VITALS — BP 130/72 | HR 58 | Temp 98.9°F | Ht 62.0 in | Wt 137.6 lb

## 2018-07-15 DIAGNOSIS — Z0000 Encounter for general adult medical examination without abnormal findings: Secondary | ICD-10-CM

## 2018-07-15 NOTE — Patient Instructions (Addendum)
Dana Coleman , Thank you for taking time to come for your Medicare Wellness Visit. I appreciate your ongoing commitment to your health goals. Please review the following plan we discussed and let me know if I can assist you in the future. Please keep your appointment with Dr. Burr Medico on 09/03/18 for your diabetic check up.  These are the goals we discussed: Goals    . Increase water intake     Infuse with fruit or vegetable    . Weight < 135 lb (61.236 kg)     Goal Weigh of 135 lbs        This is a list of the screening recommended for you and due dates:  Health Maintenance  Topic Date Due  . Hemoglobin A1C  11/10/2018  . Complete foot exam   05/11/2019  . Eye exam for diabetics  07/15/2019  . Colon Cancer Screening  11/01/2019  . Tetanus Vaccine  11/18/2027  . Flu Shot  Completed  . DEXA scan (bone density measurement)  Completed  . Pneumonia vaccines  Completed    Diabetes and Foot Care Diabetes may cause you to have problems because of poor blood supply (circulation) to your feet and legs. This may cause the skin on your feet to become thinner, break easier, and heal more slowly. Your skin may become dry, and the skin may peel and crack. You may also have nerve damage in your legs and feet causing decreased feeling in them. You may not notice minor injuries to your feet that could lead to infections or more serious problems. Taking care of your feet is one of the most important things you can do for yourself. Follow these instructions at home:  Wear shoes at all times, even in the house. Do not go barefoot. Bare feet are easily injured.  Check your feet daily for blisters, cuts, and redness. If you cannot see the bottom of your feet, use a mirror or ask someone for help.  Wash your feet with warm water (do not use hot water) and mild soap. Then pat your feet and the areas between your toes until they are completely dry. Do not soak your feet as this can dry your skin.  Apply a  moisturizing lotion or petroleum jelly (that does not contain alcohol and is unscented) to the skin on your feet and to dry, brittle toenails. Do not apply lotion between your toes.  Trim your toenails straight across. Do not dig under them or around the cuticle. File the edges of your nails with an emery board or nail file.  Do not cut corns or calluses or try to remove them with medicine.  Wear clean socks or stockings every day. Make sure they are not too tight. Do not wear knee-high stockings since they may decrease blood flow to your legs.  Wear shoes that fit properly and have enough cushioning. To break in new shoes, wear them for just a few hours a day. This prevents you from injuring your feet. Always look in your shoes before you put them on to be sure there are no objects inside.  Do not cross your legs. This may decrease the blood flow to your feet.  If you find a minor scrape, cut, or break in the skin on your feet, keep it and the skin around it clean and dry. These areas may be cleansed with mild soap and water. Do not cleanse the area with peroxide, alcohol, or iodine.  When you remove  an adhesive bandage, be sure not to damage the skin around it.  If you have a wound, look at it several times a day to make sure it is healing.  Do not use heating pads or hot water bottles. They may burn your skin. If you have lost feeling in your feet or legs, you may not know it is happening until it is too late.  Make sure your health care provider performs a complete foot exam at least annually or more often if you have foot problems. Report any cuts, sores, or bruises to your health care provider immediately. Contact a health care provider if:  You have an injury that is not healing.  You have cuts or breaks in the skin.  You have an ingrown nail.  You notice redness on your legs or feet.  You feel burning or tingling in your legs or feet.  You have pain or cramps in your legs and  feet.  Your legs or feet are numb.  Your feet always feel cold. Get help right away if:  There is increasing redness, swelling, or pain in or around a wound.  There is a red line that goes up your leg.  Pus is coming from a wound.  You develop a fever or as directed by your health care provider.  You notice a bad smell coming from an ulcer or wound. This information is not intended to replace advice given to you by your health care provider. Make sure you discuss any questions you have with your health care provider. Document Released: 09/26/2000 Document Revised: 03/06/2016 Document Reviewed: 03/08/2013 Elsevier Interactive Patient Education  2017 [place] Prevention in the Home Falls can cause injuries. They can happen to people of all ages. There are many things you can do to make your home safe and to help prevent falls. What can I do on the outside of my home?  Regularly fix the edges of walkways and driveways and fix any cracks.  Remove anything that might make you trip as you walk through a door, such as a raised step or threshold.  Trim any bushes or trees on the path to your home.  Use bright outdoor lighting.  Clear any walking paths of anything that might make someone trip, such as rocks or tools.  Regularly check to see if handrails are loose or broken. Make sure that both sides of any steps have handrails.  Any raised decks and porches should have guardrails on the edges.  Have any leaves, snow, or ice cleared regularly.  Use sand or salt on walking paths during winter.  Clean up any spills in your garage right away. This includes oil or grease spills. What can I do in the bathroom?  Use night lights.  Install grab bars by the toilet and in the tub and shower. Do not use towel bars as grab bars.  Use non-skid mats or decals in the tub or shower.  If you need to sit down in the shower, use a plastic, non-slip stool.  Keep the floor dry.  Clean up any water that spills on the floor as soon as it happens.  Remove soap buildup in the tub or shower regularly.  Attach bath mats securely with double-sided non-slip rug tape.  Do not have throw rugs and other things on the floor that can make you trip. What can I do in the bedroom?  Use night lights.  Make sure that you have a  light by your bed that is easy to reach.  Do not use any sheets or blankets that are too big for your bed. They should not hang down onto the floor.  Have a firm chair that has side arms. You can use this for support while you get dressed.  Do not have throw rugs and other things on the floor that can make you trip. What can I do in the kitchen?  Clean up any spills right away.  Avoid walking on wet floors.  Keep items that you use a lot in easy-to-reach places.  If you need to reach something above you, use a strong step stool that has a grab bar.  Keep electrical cords out of the way.  Do not use floor polish or wax that makes floors slippery. If you must use wax, use non-skid floor wax.  Do not have throw rugs and other things on the floor that can make you trip. What can I do with my stairs?  Do not leave any items on the stairs.  Make sure that there are handrails on both sides of the stairs and use them. Fix handrails that are broken or loose. Make sure that handrails are as long as the stairways.  Check any carpeting to make sure that it is firmly attached to the stairs. Fix any carpet that is loose or worn.  Avoid having throw rugs at the top or bottom of the stairs. If you do have throw rugs, attach them to the floor with carpet tape.  Make sure that you have a light switch at the top of the stairs and the bottom of the stairs. If you do not have them, ask someone to add them for you. What else can I do to help prevent falls?  Wear shoes that: ? Do not have high heels. ? Have rubber bottoms. ? Are comfortable and fit you  well. ? Are closed at the toe. Do not wear sandals.  If you use a stepladder: ? Make sure that it is fully opened. Do not climb a closed stepladder. ? Make sure that both sides of the stepladder are locked into place. ? Ask someone to hold it for you, if possible.  Clearly mark and make sure that you can see: ? Any grab bars or handrails. ? First and last steps. ? Where the edge of each step is.  Use tools that help you move around (mobility aids) if they are needed. These include: ? Canes. ? Walkers. ? Scooters. ? Crutches.  Turn on the lights when you go into a dark area. Replace any light bulbs as soon as they burn out.  Set up your furniture so you have a clear path. Avoid moving your furniture around.  If any of your floors are uneven, fix them.  If there are any pets around you, be aware of where they are.  Review your medicines with your doctor. Some medicines can make you feel dizzy. This can increase your chance of falling. Ask your doctor what other things that you can do to help prevent falls. This information is not intended to replace advice given to you by your health care provider. Make sure you discuss any questions you have with your health care provider. Document Released: 07/26/2009 Document Revised: 03/06/2016 Document Reviewed: 11/03/2014 Elsevier Interactive Patient Education  [employer].

## 2018-07-15 NOTE — Progress Notes (Signed)
Subjective:   [name] is a 77 y.o. female who presents for Medicare Annual (Subsequent) preventive examination. The patient was informed that the wellness visit is to identify future health risk and educate and initiate measures that can reduce risk for increased disease through the lifespan.   Review of Systems:  Physical assessment deferred to PCP.  Cardiac Risk Factors include: advanced age (>29men, >26 women);diabetes mellitus;dyslipidemia;hypertension    Objective:    Vitals: BP 130/72   Pulse (!) 58   Temp 98.9 F (37.2 C) (Oral)   Ht 5\' 2"  (1.575 m)   Wt 137 lb 9.6 oz (62.4 kg)   SpO2 98%   BMI 25.17 kg/m   Body mass index is 25.17 kg/m.  Advanced Directives 07/15/2018 12/12/2017 12/12/2017 07/14/2017 04/07/2017 10/21/2016 05/22/2016  Does Patient Have a Medical Advance Directive? No No No No No No No  Would patient like information on creating a medical advance directive? Yes (MAU/Ambulatory/Procedural Areas - Information given) No - Patient declined - Yes (MAU/Ambulatory/Procedural Areas - Information given) No - Patient declined No - Patient declined No - patient declined information    Tobacco Social History   Tobacco Use  Smoking Status Never Smoker  Smokeless Tobacco Never Used  Tobacco Comment   No plans to start     Counseling given: Yes Comment: No plans to start  Clinical Intake:  Pre-visit preparation completed: Yes  Pain : No/denies pain Pain Score: 0-No pain   Nutritional Status: BMI of 19-24  Normal Diabetes: Yes CBG done?: No Did pt. bring in CBG monitor from home?: No  How often do you need to have someone help you when you read instructions, pamphlets, or other written materials from your doctor or pharmacy?: 1 - Never What is the last grade level you completed in school?: 10th grade  Interpreter Needed?: No   Past Medical History:  Diagnosis Date  . Breast cancer ([place]) 04/2008   Right breast  . Cataracts, bilateral   . Cervical  cancer ([place])    When the patient was in her 32s  . Diabetes mellitus without complication ([place])   . Heart attack ([place]) 2004  . 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 Hyperlipidemia   . Hypertension   . Personal history of radiation therapy 2009   Past Surgical History:  Procedure Laterality Date  . BREAST LUMPECTOMY Right 2009  . BREAST SURGERY  2009   right lumpectomy  . CERVIX SURGERY  1980  . LEFT HEART CATHETERIZATION WITH CORONARY ANGIOGRAM N/A 04/20/2014   Procedure: LEFT HEART CATHETERIZATION WITH CORONARY ANGIOGRAM;  Surgeon: Jacolyn Reedy, MD;  Location[facility] CATH LAB;  Service: Cardiovascular;  Laterality: N/A;  . thyroid     for nodule hemithyroidectomy, benign  . THYROIDECTOMY Right 03/2003   Family History  Problem Relation Age of Onset  . Heart disease Mother        age 15  . Heart disease Father   . Cancer Father        unknown  . Heart disease Brother   . Hypertension Brother   . Diabetes Daughter   . Stroke Daughter   . Cirrhosis Brother    Social History   Socioeconomic History  . Marital status: Divorced    Spouse name: Not on file  . Number of children: 5  . Years of education: 10  . Highest education level: 10th grade  Occupational History  . Occupation: Retired- Producer, television/film/video  . Financial  resource strain: Not hard at all  . Food insecurity:    Worry: Never true    Inability: Never true  . Transportation needs:    Medical: No    Non-medical: No  Tobacco Use  . Smoking status: Never Smoker  . Smokeless tobacco: Never Used  . Tobacco comment: No plans to start  Substance and Sexual Activity  . Alcohol use: No  . Drug use: No  . Sexual activity: Not Currently    Birth control/protection: Condom  Lifestyle  . Physical activity:    Days per week: 0 days    Minutes per session: 0 min  . Stress: Not at all  Relationships  . Social connections:    Talks on phone: More than three times a week    Gets together: More than  three times a week    Attends religious service: More than 4 times per year    Active member of club or organization: Yes    Attends meetings of clubs or organizations: More than 4 times per year    Relationship status: Divorced  Other Topics Concern  . Not on file  Social History Narrative   Current Social History 07/15/2018     Lives alone, divorced. Lives in one level house, has three stairs in front, with handrail. No grab bars in bathroom. Does have throw rugs on floor. Has smoke alarms, adequate lighting. Has family in [place].  Does light house cleaning part time.     Transportation: Patient has own vehicle    Religious / Personal Beliefs: [place]: info given   End of Life Plan: gave pt living will referral info   Emergency Contact: daughter, Solmon Ice [phone]   Any pets: none   Diet: Pt has a varied diet of protein, starch, and vegetables and fruits.   Seatbelts: Pt reports wearing seatbelt when in vehicles.    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 amLODipine (NORVASC) 5 MG tablet Take 5 mg by mouth daily.  Marland Kitchen aspirin 81 MG chewable tablet Chew 1 tablet (81 mg total) by mouth daily.  . Blood Glucose Monitoring Suppl MISC One touch ultra glucose monitor. Check blood sugar twice a day.  . cholecalciferol 2000 UNITS tablet Take 2,000 Units by mouth daily.   . cyclobenzaprine (FLEXERIL) 5 MG tablet Take 1 tablet (5 mg total) by mouth at bedtime as needed for muscle spasms.  Marland Kitchen glimepiride (AMARYL) 1 MG tablet TAKE 1 TABLET (1 MG TOTAL) BY MOUTH DAILY BEFORE BREAKFAST.  Marland Kitchen glucose blood (ONE TOUCH ULTRA TEST) test strip CHECK BLOOD SUGAR TWICE DAILY  . HUMALOG KWIKPEN 100 UNIT/ML KiwkPen INJECT 6 UNITS INTO THE SKIN DAILY BEFORE  SUPPER  . ILEVRO 0.3 % ophthalmic suspension Place 1 drop into the right eye every other day.   . Insulin Glargine (LANTUS SOLOSTAR) 100 UNIT/ML Solostar Pen INJECT 30 UNITS INTO THE SKIN EVERY MORNING.  . Insulin Pen Needle (B-D UF III MINI PEN NEEDLES)  31G X 5 MM MISC INJECT INSULIN VIA PEN 6 TIMES DAILY  . Lancets MISC 1 each by Other route See admin instructions. Check blood sugar twice daily  . losartan (COZAAR) 25 MG tablet TAKE 1 TABLET BY MOUTH EVERY DAY  . metFORMIN (GLUCOPHAGE) 500 MG tablet Take 1 tablet (500 mg total) by mouth daily with breakfast.  . metoprolol succinate (TOPROL-XL) 50 MG 24 hr tablet See admin instructions.  . nitroGLYCERIN (NITROSTAT) 0.4 MG SL tablet Place 0.4 mg under the tongue every 5 (five) minutes as needed for chest pain.  . rosuvastatin (CRESTOR) 40 MG tablet TAKE 1 TABLET BY MOUTH AT BEDTIME FOR CHOLESTEROL  . FLUAD 0.5 ML SUSY TO BE ADMINISTERED BY PHARMACIST FOR IMMUNIZATION   No facility-administered encounter medications on file as of 07/15/2018.     Activities of Daily Living In your present state of health, do you have any difficulty performing the following activities: 07/15/2018 12/12/2017  Hearing? N N  Vision? N N  Comment reading glasses -  Difficulty concentrating or making decisions? N N  Walking or climbing stairs? N N  Dressing or bathing? N N  Doing errands, shopping? N N  Preparing Food and eating ? N -  Using the Toilet? N -  In the past six months, have you accidently leaked urine? N -  Do you have problems with loss of bowel control? N -  Managing your Medications? N -  Managing your Finances? N -  Housekeeping or managing your Housekeeping? N -  Some recent data might be hidden   Patient Care Team: Everrett Coombe, MD as PCP - General Luberta Mutter, MD (Ophthalmology) Jacolyn Reedy, MD (Cardiology)    Assessment:   This is a routine wellness examination for [name]. All health maintenance is up to date.  Exercise  Activities and Dietary recommendations Current Exercise Habits: The patient does not participate in regular exercise at present, Exercise limited by: None identified  Goals    . Increase water intake     Infuse with fruit or vegetable    . Weight < 135 lb (61.236 kg)     Goal Weigh of 135 lbs       Fall Risk Fall Risk  07/15/2018 12/23/2017 11/17/2017 08/18/2017 07/14/2017  Falls in the past year? No No No No No  Risk for fall due to : - - - - Medication side effect   Is the patient's home free of loose throw rugs in walkways, pet beds, electrical cords, etc?   yes      Grab bars in the bathroom? no      Handrails on the stairs?   yes      Adequate lighting?   no  Depression Screen PHQ 2/9 Scores 07/15/2018 05/10/2018 12/23/2017 11/17/2017  PHQ - 2 Score 0 0 0 0     Cognitive Function MMSE - Mini Mental State Exam 07/15/2018 01/03/2014  Orientation to time 5 5  Orientation to Place 5 5  Registration 3 3  Attention/ Calculation 5 4  Recall 3 3  Language- name 2 objects 2 2  Language- repeat 1 1  Language- follow 3 step command 3 3  Language- read & follow direction 1 1  Write a sentence 1 1  Copy design 1 1  Total score 30 29     6CIT Screen 07/15/2018  What Year? 0 points  What month? 0 points  What time? 0 points  Count back from 20 0 points  Months  in reverse 0 points  Repeat phrase 0 points  Total Score 0    Immunization History  Administered Date(s) Administered  . Influenza Split 06/30/2011  . Influenza Whole 07/24/2008, 08/24/2009, 08/15/2010  . Influenza,inj,Quad PF,6+ Mos 06/29/2013, 08/14/2014, 11/07/2014, 08/09/2015, 08/05/2016, 07/14/2017  . Pneumococcal Conjugate-13 11/16/2015  . Pneumococcal Polysaccharide-23 11/14/1999, 08/15/2010, 06/30/2011  . Td 03/13/2006  . Tdap 11/17/2017   Discussed Shingrix vaccine.  Screening Tests Health Maintenance  Topic Date Due  . HEMOGLOBIN A1C  11/10/2018  . FOOT EXAM  05/11/2019  . OPHTHALMOLOGY EXAM  07/15/2019    . COLONOSCOPY  11/01/2019  . TETANUS/TDAP  11/18/2027  . INFLUENZA VACCINE  Completed  . DEXA SCAN  Completed  . PNA vac Low Risk Adult  Completed    Cancer Screenings: Lung: Low Dose CT Chest recommended if Age 20-80 years, 30 pack-year currently smoking OR have quit w/in 15years. Patient does not qualify. Breast:  Up to date on Mammogram? Yes   Up to date of Bone Density/Dexa? Yes Colorectal: up to date     Plan:  Follow-up appointment made with PCP for DM.   I have personally reviewed and noted the following in the patient's chart:   . Medical and social history . Use of alcohol, tobacco or illicit drugs  . Current medications and supplements . Functional ability and status . Nutritional status . Physical activity . Advanced directives . List of other physicians . Hospitalizations, surgeries, and ER visits in previous 12 months . Vitals . Screenings to include cognitive, depression, and falls . Referrals and appointments  In addition, I have reviewed and discussed with patient certain preventive protocols, quality metrics, and best practice recommendations. A written personalized care plan for preventive services as well as general preventive health recommendations were provided to patient.     Esau Grew, RN  07/15/2018

## 2018-08-18 ENCOUNTER — Other Ambulatory Visit: Payer: Self-pay | Admitting: Student in an Organized Health Care Education/Training Program

## 2018-08-18 ENCOUNTER — Telehealth: Payer: Self-pay | Admitting: Cardiology

## 2018-08-18 DIAGNOSIS — E119 Type 2 diabetes mellitus without complications: Secondary | ICD-10-CM

## 2018-08-18 DIAGNOSIS — I10 Essential (primary) hypertension: Secondary | ICD-10-CM

## 2018-08-18 DIAGNOSIS — Z794 Long term (current) use of insulin: Principal | ICD-10-CM

## 2018-08-19 ENCOUNTER — Encounter: Payer: Self-pay | Admitting: Student in an Organized Health Care Education/Training Program

## 2018-08-25 NOTE — Telephone Encounter (Signed)
Done

## 2018-09-03 ENCOUNTER — Ambulatory Visit: Payer: Medicare Other | Admitting: Student in an Organized Health Care Education/Training Program

## 2018-09-22 ENCOUNTER — Other Ambulatory Visit: Payer: Self-pay | Admitting: Family Medicine

## 2018-09-30 ENCOUNTER — Ambulatory Visit (INDEPENDENT_AMBULATORY_CARE_PROVIDER_SITE_OTHER): Payer: Medicare Other | Admitting: Family Medicine

## 2018-09-30 VITALS — BP 125/70 | HR 64 | Temp 98.7°F | Wt 139.0 lb

## 2018-09-30 DIAGNOSIS — B070 Plantar wart: Secondary | ICD-10-CM | POA: Diagnosis not present

## 2018-09-30 DIAGNOSIS — R079 Chest pain, unspecified: Secondary | ICD-10-CM | POA: Diagnosis not present

## 2018-09-30 DIAGNOSIS — M722 Plantar fascial fibromatosis: Secondary | ICD-10-CM | POA: Diagnosis not present

## 2018-09-30 MED ORDER — ANKLE SPLINT/NIGHT AIRFORM MISC: 1.0000 | Freq: Every day | 0 refills | Status: DC

## 2018-09-30 NOTE — Assessment & Plan Note (Signed)
Exam and history consistent with plantar fasciitis.  Patient advised to use ice on foot upon waking. -Use night splint -Can use arch support

## 2018-09-30 NOTE — Assessment & Plan Note (Signed)
Low risk of cardiac etiology of chest pain.  Likely musculoskeletal in origin as she can reproduce it with movement of her arms.  Reassured as patient does not currently have chest pain and does not exertional chest pain.    -Follow-up with cardiology -Patient to return if worsens or increases in frequency

## 2018-09-30 NOTE — Progress Notes (Signed)
Subjective: No chief complaint on file.    HPI: [name] is a 77 y.o. presenting to clinic today to discuss the following:  1 Right Heel Pain Pain first thing in the morning when steps on it.  Started about 1 month ago.  Has not tried anything to help it.  Has never had this before.  No injury.  No numbness or tinging.  Also worse when goes from sitting to standing.  Pain is 5/10.  Tender to touch.    2 Left Foot Bump Sore when she touches it.  Has been there about 1 year and has not gone away.  Has not tried any over the counter medications.  Is not truly bothering her, but she is worried that it has not gone away.    3 Chest pain  Patient notes that she has occasional chest pain that has been off and on for the last few months.  She states that it is worse with raising her arms or bending forward.  She does not note pain with exertion.  She did note some pain associated with anxiety.  She states that nothing makes the pain better other than stopping the exacerbating motions.  She does not have any associated shortness of breath.  No swelling.  She states that the pain is in the epigastric region and does not radiate.  She has had chest pain in the past and has a coronary stent, but states that this was not the same pain.  She currently denies chest pain.  She follows with a cardiologist and is due to see them.  Her risk factors include CAD, type II IDDM, hypertension, hyperlipidemia.  CAD consortium 11% using basic model.  ROS noted in HPI.   Past Medical, Surgical, Social, and Family History Reviewed & Updated per EMR.   Pertinent Historical Findings include:   Social History   Tobacco Use  Smoking Status Never Smoker  Smokeless Tobacco Never Used  Tobacco Comment   No plans to start      Objective: BP 125/70   Pulse 64   Temp 98.7 F (37.1 C) (Oral)   Wt 139 lb (63 kg)   SpO2 96%   BMI 25.42 kg/m  Vitals and nursing notes reviewed  Physical  Exam: General: 77 y.o. female in NAD Cardio: RRR no m/r/g Lungs: CTAB, no wheezing, no rhonchi, no crackles, no increased work of breathing Chest: No tenderness to palpation of chest wall Abdomen: Soft, non-tender to palpation, positive bowel sounds Skin: warm and dry Extremities: No edema Right foot: No visual deformities, tenderness to palpation of right calcaneus, 2+ dorsalis pedis pulses 5 out of 5 strength right lower extremity, sensation intact in right lower extremity Left foot: Small plantar wart on left foot, no indication of ulceration  Cryotherapy of left plantar wart Patient educated on risks and benefits of procedure.  Consent form signed.  Liquid nitrogen was applied to the left plantar wart in usual fashion x3.  Patient tolerated the procedure well.  No complications.  No dressing applied.  No evidence of ulceration of the skin noted.   No results found for this or any previous visit (from the past 72 hour(s)).  Assessment/Plan:  Plantar fasciitis, right Exam and history consistent with plantar fasciitis.  Patient advised to use ice on foot upon waking. -Use night splint -Can use arch support  Plantar wart Cryotherapy applied today.  Can repeat in 4 weeks if no improvement.  Also advised to  use duct tape twice daily for the next 4 weeks.  Chest pain Low risk of cardiac etiology of chest pain.  Likely musculoskeletal in origin as she can reproduce it with movement of her arms.  Reassured as patient does not currently have chest pain and does not exertional chest pain.    -Follow-up with cardiology -Patient to return if worsens or increases in frequency     PATIENT EDUCATION PROVIDED: See AVS    Diagnosis and plan along with any newly prescribed medication(s) were discussed in detail with this patient today. The patient verbalized understanding and agreed with the plan. Patient advised if symptoms worsen return to clinic or ER.   Health Maintainance:   No orders  of the defined types were placed in this encounter.   Meds ordered this encounter  Medications  . Elastic Bandages & Supports (ANKLE SPLINT/NIGHT AIRFORM) MISC    Sig: 1 each by Does not apply route at bedtime. Apply to right foot    Dispense:  1 each    Refill:  Little Browning, DO 09/30/2018, 4:37 PM PGY-1 [place]

## 2018-09-30 NOTE — Patient Instructions (Signed)
Thank you for coming to see me today. It was a pleasure. Today we talked about:   Right heel pain.  This is likely caused by something called plantar fasciitis.  The passively you can do is freeze 2 water bottles overnight and tiptoes to the kitchen.  Put them on your heel first thing in the morning.  He can also use ibuprofen as needed for pain.  Do not take any more than 9 tablets during the day and do not take any more than 3 tablets (600 mg) at 1 time.  Arch supports can also be helpful.  I have sent a brace in to yoru pharmacy.  Use this at night.  Left heel bump, which is a plantar wart.  We use liquid nitrogen to burn this off.  Cover it with duct tape and rip it off every night and every morning.  Do this for the next four weeks.  If at that time it is not better, please come back and we can repeat the treatment.  Chest Pain.  This does not sound like it is coming from your heart.  If it worsens or becomes more frequent, please call or come in.  You are due to see your cardiologist, so please call to schedule an appointment.  Please follow-up with Dr. Burr Medico as needed.  If you have any questions or concerns, please do not hesitate to call the office at [phone].  Best,   [place] Constable, DO   Plantar Fasciitis  Plantar fasciitis is a painful foot condition that affects the heel. It occurs when the band of tissue that connects the toes to the heel bone (plantar fascia) becomes irritated. This can happen as the result of exercising too much or doing other repetitive activities (overuse injury). The pain from plantar fasciitis can range from mild irritation to severe pain that makes it difficult to walk or move. The pain is usually worse in the morning after sleeping, or after sitting or lying down for a while. Pain may also be worse after long periods of walking or standing. What are the causes? This condition may be caused by:  Standing for long periods of time.  Wearing shoes  that do not have good arch support.  Doing activities that put stress on joints (high-impact activities), including running, aerobics, and ballet.  Being overweight.  An abnormal way of walking (gait).  Tight muscles in the back of your lower leg (calf).  High arches in your feet.  Starting a new athletic activity. What are the signs or symptoms? The main symptom of this condition is heel pain. Pain may:  Be worse with first steps after a time of rest, especially in the morning after sleeping or after you have been sitting or lying down for a while.  Be worse after long periods of standing still.  Decrease after 30-45 minutes of activity, such as gentle walking. How is this diagnosed? This condition may be diagnosed based on your medical history and your symptoms. Your health care provider may ask questions about your activity level. Your health care provider will do a physical exam to check for:  A tender area on the bottom of your foot.  A high arch in your foot.  Pain when you move your foot.  Difficulty moving your foot. You may have imaging tests to confirm the diagnosis, such as:  X-rays.  Ultrasound.  MRI. How is this treated? Treatment for plantar fasciitis depends on how severe your condition is.  Treatment may include:  Rest, ice, applying pressure (compression), and raising the affected foot (elevation). This may be called RICE therapy. Your health care provider may recommend RICE therapy along with over-the-counter pain medicines to manage your pain.  Exercises to stretch your calves and your plantar fascia.  A splint that holds your foot in a stretched, upward position while you sleep (night splint).  Physical therapy to relieve symptoms and prevent problems in the future.  Injections of steroid medicine (cortisone) to relieve pain and inflammation.  Stimulating your plantar fascia with electrical impulses (extracorporeal shock wave therapy). This is  usually the last treatment option before surgery.  Surgery, if other treatments have not worked after 12 months. Follow these instructions at home:  Managing pain, stiffness, and swelling  If directed, put ice on the painful area: ? Put ice in a plastic bag, or use a frozen bottle of water. ? Place a towel between your skin and the bag or bottle. ? Roll the bottom of your foot over the bag or bottle. ? Do this for 20 minutes, 2-3 times a day.  Wear athletic shoes that have air-sole or gel-sole cushions, or try wearing soft shoe inserts that are designed for plantar fasciitis.  Raise (elevate) your foot above the level of your heart while you are sitting or lying down. Activity  Avoid activities that cause pain. Ask your health care provider what activities are safe for you.  Do physical therapy exercises and stretches as told by your health care provider.  Try activities and forms of exercise that are easier on your joints (low-impact). Examples include swimming, water aerobics, and biking. General instructions  Take over-the-counter and prescription medicines only as told by your health care provider.  Wear a night splint while sleeping, if told by your health care provider. Loosen the splint if your toes tingle, become numb, or turn cold and blue.  Maintain a healthy weight, or work with your health care provider to lose weight as needed.  Keep all follow-up visits as told by your health care provider. This is important. Contact a health care provider if you:  Have symptoms that do not go away after caring for yourself at home.  Have pain that gets worse.  Have pain that affects your ability to move or do your daily activities. Summary  Plantar fasciitis is a painful foot condition that affects the heel. It occurs when the band of tissue that connects the toes to the heel bone (plantar fascia) becomes irritated.  The main symptom of this condition is heel pain that may be  worse after exercising too much or standing still for a long time.  Treatment varies, but it usually starts with rest, ice, compression, and elevation (RICE therapy) and over-the-counter medicines to manage pain. This information is not intended to replace advice given to you by your health care provider. Make sure you discuss any questions you have with your health care provider. Document Released: 06/24/2001 Document Revised: 07/27/2017 Document Reviewed: 07/27/2017 Elsevier Interactive Patient Education  2019 [employer].

## 2018-09-30 NOTE — Assessment & Plan Note (Signed)
Cryotherapy applied today.  Can repeat in 4 weeks if no improvement.  Also advised to use duct tape twice daily for the next 4 weeks.

## 2018-11-05 ENCOUNTER — Other Ambulatory Visit: Payer: Self-pay

## 2018-11-05 NOTE — Patient Outreach (Signed)
[facility]) Care Management  11/05/2018  JOSSALIN CHERVENAK July 31, 1941 [id number]   Medication Adherence call to Mrs. Fallyn Munnerlyn patient did not answer patient is due on Losartan 25 mg and Rosuvastatin 40 mg. [facility] Patient last pick up was on 10/25/18 for a 90 days supply patient wont be due until three more month.Mrs. Dosanjh is showing past due under [place].   [facility] Direct Dial [phone]  Fax [phone] [email or web address]

## 2018-11-22 DIAGNOSIS — I10 Essential (primary) hypertension: Secondary | ICD-10-CM | POA: Diagnosis not present

## 2018-11-22 DIAGNOSIS — I252 Old myocardial infarction: Secondary | ICD-10-CM | POA: Diagnosis not present

## 2018-11-22 DIAGNOSIS — R0609 Other forms of dyspnea: Secondary | ICD-10-CM | POA: Diagnosis not present

## 2018-11-22 DIAGNOSIS — E119 Type 2 diabetes mellitus without complications: Secondary | ICD-10-CM | POA: Diagnosis not present

## 2018-11-22 DIAGNOSIS — I2510 Atherosclerotic heart disease of native coronary artery without angina pectoris: Secondary | ICD-10-CM | POA: Diagnosis not present

## 2018-11-24 ENCOUNTER — Other Ambulatory Visit: Payer: Self-pay | Admitting: Student in an Organized Health Care Education/Training Program

## 2018-11-24 DIAGNOSIS — IMO0002 Reserved for concepts with insufficient information to code with codable children: Secondary | ICD-10-CM

## 2018-11-24 DIAGNOSIS — E1165 Type 2 diabetes mellitus with hyperglycemia: Secondary | ICD-10-CM

## 2018-11-24 DIAGNOSIS — Z794 Long term (current) use of insulin: Principal | ICD-10-CM

## 2018-12-06 ENCOUNTER — Other Ambulatory Visit: Payer: Self-pay

## 2018-12-06 ENCOUNTER — Encounter: Payer: Self-pay | Admitting: Student in an Organized Health Care Education/Training Program

## 2018-12-06 ENCOUNTER — Other Ambulatory Visit: Payer: Self-pay | Admitting: Student in an Organized Health Care Education/Training Program

## 2018-12-06 ENCOUNTER — Ambulatory Visit (INDEPENDENT_AMBULATORY_CARE_PROVIDER_SITE_OTHER): Payer: Medicare Other | Admitting: Student in an Organized Health Care Education/Training Program

## 2018-12-06 VITALS — BP 122/74 | HR 58 | Temp 98.0°F | Ht 62.0 in | Wt 139.0 lb

## 2018-12-06 DIAGNOSIS — IMO0002 Reserved for concepts with insufficient information to code with codable children: Secondary | ICD-10-CM

## 2018-12-06 DIAGNOSIS — I10 Essential (primary) hypertension: Secondary | ICD-10-CM | POA: Diagnosis not present

## 2018-12-06 DIAGNOSIS — E1165 Type 2 diabetes mellitus with hyperglycemia: Secondary | ICD-10-CM | POA: Diagnosis not present

## 2018-12-06 DIAGNOSIS — N6310 Unspecified lump in the right breast, unspecified quadrant: Secondary | ICD-10-CM

## 2018-12-06 DIAGNOSIS — Z794 Long term (current) use of insulin: Secondary | ICD-10-CM

## 2018-12-06 DIAGNOSIS — E785 Hyperlipidemia, unspecified: Secondary | ICD-10-CM | POA: Diagnosis not present

## 2018-12-06 DIAGNOSIS — N63 Unspecified lump in unspecified breast: Secondary | ICD-10-CM

## 2018-12-06 LAB — POCT GLYCOSYLATED HEMOGLOBIN (HGB A1C): HbA1c, POC (controlled diabetic range): 7.3 % — AB (ref 0.0–7.0)

## 2018-12-06 NOTE — Progress Notes (Signed)
Subjective:    Dana Coleman - 77 y.o. female MRN [id number]  Date of birth: February 04, 1941  HPI  [name] is here for hypertension, diabetes, and high cholesterol.  HYPERTENSION Disease Monitoring: Occasionally checks at the grocery store Blood pressure range- does not know  Chest pain, palpitations- none       Dyspnea- none Medications:norvasc 5 mg daily, losartan  Compliance- 100% reported  Lightheadedness,Syncope- denies    Edema- denies  DIABETES Disease Monitoring: A1c 7.3 from 6.8 Blood Sugar ranges- 80 in the AM  Polyuria/phagia/dipsia- polyuria at night       Visual problems- no, last ophtho appointment was <1year ago, next appt in april Medications: metformin 500 mg daily, lantus 35u from 30u changed by cardiology this month, humalog 6u once daily, stopped glimepiride 1 mg daily Compliance- no  Hypoglycemic symptoms- no Patient is on ASA. Microalbuminuria testing deferred as patient is already on an ACE/ARB.  HYPERLIPIDEMIA Disease Monitoring: See symptoms for Hypertension Medications: Crestor 40 mg daily Compliance- 100% reported  Right upper quadrant pain- denies   Muscle aches- denies  Right breast lump has history of breast cancer in the right breast status post lumpectomy back in 2009.  She reports that she does not do regular self examinations.  She noticed a lump in her right breast recently.  She has not had any overlying skin changes or nipple discharge.  She has not had any night sweats or weight loss.  She is unsure whether the lump is new or if it has been there for a while.  Monitoring Labs and Parameters Last A1C:  Lab Results  Component Value Date   HGBA1C 7.3 (A) 12/06/2018    Last Lipid:     Component Value Date/Time   CHOL 92 12/13/2017 0028   CHOL 142 10/24/2016 0000   HDL 36 (L) 12/13/2017 0028   HDL 60 10/24/2016 0000   LDLDIRECT 58 07/30/2012 1438    Last Bmet  Potassium  Date Value Ref Range Status  12/13/2017 3.9  3.5 - 5.1 mmol/L Final  08/15/2013 3.9 3.5 - 5.1 mEq/L Final   Sodium  Date Value Ref Range Status  12/13/2017 141 135 - 145 mmol/L Final  10/24/2016 145 (H) 134 - 144 mmol/L Final  08/15/2013 142 136 - 145 mEq/L Final   Creatinine  Date Value Ref Range Status  08/15/2013 0.9 0.6 - 1.1 mg/dL Final   Creat  Date Value Ref Range Status  12/08/2014 0.91 0.50 - 1.10 mg/dL Final   Creatinine, Ser  Date Value Ref Range Status  12/13/2017 1.01 (H) 0.44 - 1.00 mg/dL Final      Last BPs:  BP Readings from Last 3 Encounters:  12/06/18 122/74  09/30/18 125/70  07/15/18 130/72      Health Maintenance:   There are no preventive care reminders to display for this patient.  -  reports that she has never smoked. She has never used smokeless tobacco. - Review of Systems: Per HPI. - Past Medical History: Patient Active Problem List   Diagnosis Date Noted  . Breast lump 12/07/2018  . Plantar fasciitis, right 09/30/2018  . Plantar wart 11/17/2017  . Insulin dependent type 2 diabetes mellitus, uncontrolled ([place]) 08/14/2014  . History of thyroid nodule 01/16/2011  . History of breast cancer (right)  stage 1 ERA positive   . History of cervical cancer   . Hyperlipidemia   . Hypertension   . CAD (coronary artery disease), native coronary artery   .  OSTEOPENIA 12/10/2006   - Medications: reviewed and updated Current Outpatient Medications  Medication Sig Dispense Refill  . amLODipine (NORVASC) 5 MG tablet Take 5 mg by mouth daily.  12  . aspirin 81 MG chewable tablet Chew 1 tablet (81 mg total) by mouth daily. 90 tablet 3  . Blood Glucose Monitoring Suppl MISC One touch ultra glucose monitor. Check blood sugar twice a day.    . cholecalciferol 2000 UNITS tablet Take 2,000 Units by mouth daily.     . cyclobenzaprine (FLEXERIL) 5 MG tablet Take 1 tablet (5 mg total) by mouth at bedtime as needed for muscle spasms. 5 tablet 0  . Elastic Bandages & Supports (ANKLE SPLINT/NIGHT AIRFORM)  MISC 1 each by Does not apply route at bedtime. Apply to right foot 1 each 0  . FLUAD 0.5 ML SUSY TO BE ADMINISTERED BY PHARMACIST FOR IMMUNIZATION  0  . glucose blood (ONE TOUCH ULTRA TEST) test strip CHECK BLOOD SUGAR TWICE DAILY 100 each 1  . HUMALOG KWIKPEN 100 UNIT/ML KiwkPen INJECT 6 UNITS INTO THE SKIN DAILY BEFORE SUPPER 15 pen 5  . ILEVRO 0.3 % ophthalmic suspension Place 1 drop into the right eye every other day.   3  . Insulin Glargine (LANTUS SOLOSTAR) 100 UNIT/ML Solostar Pen INJECT 30 UNITS INTO THE SKIN EVERY MORNING. 60 pen 4  . Insulin Pen Needle (B-D UF III MINI PEN NEEDLES) 31G X 5 MM MISC INJECT INSULIN VIA PEN 6 TIMES DAILY 200 each 3  . Lancets MISC 1 each by Other route See admin instructions. Check blood sugar twice daily 200 each 3  . losartan (COZAAR) 25 MG tablet TAKE 1 TABLET BY MOUTH EVERY DAY 90 tablet 0  . metFORMIN (GLUCOPHAGE) 500 MG tablet TAKE 1 TABLET 3 TIMES A DAY BEFORE MEALS 270 tablet 3  . metoprolol succinate (TOPROL-XL) 50 MG 24 hr tablet See admin instructions.  12  . nitroGLYCERIN (NITROSTAT) 0.4 MG SL tablet Place 0.4 mg under the tongue every 5 (five) minutes as needed for chest pain.    . rosuvastatin (CRESTOR) 40 MG tablet TAKE 1 TABLET BY MOUTH AT BEDTIME FOR CHOLESTEROL 90 tablet 2   No current facility-administered medications for this visit.     Review of Systems See HPI     Objective:   Physical Exam BP 122/74   Pulse (!) 58   Temp 98 F (36.7 C) (Oral)   Ht 5\' 2"  (1.575 m)   Wt 139 lb (63 kg)   SpO2 96%   BMI 25.42 kg/m  Gen: NAD, alert, cooperative with exam, well-appearing  HEENT: NCAT, PERRL, clear conjunctiva, oropharynx clear, supple neck CV: RRR, good S1/S2, no murmur, no edema, capillary refill brisk  Resp: CTABL, no wheezes, non-labored Abd: SNTND, BS present, no guarding or organomegaly Skin: no rashes, normal turgor  Neuro: no gross deficits.  Psych: good insight, alert and oriented Breast: Palpable hardness in the  right upper quadrant of the right breast near lumpectomy scars without surrounding erythema or skin changes.  No nipple discharge.  No other palpable masses.  Left breast was normal.    Assessment & Plan:   Insulin dependent type 2 diabetes mellitus, uncontrolled (HCC) Insulin was increased by cardiology within the last month.  Continue current management for now.  Follow-up in 3 months for next A1c.  Hypertension Well-controlled today.  Continue current management.  Hyperlipidemia No red flags.  Continue current management.  Breast lump Patient does have a history of breast cancer  and lumpectomy in the right breast back in 2009.  She does not do regular self examinations.  She has had regular mammogram screening most recently last year.  There is no abnormality on her mammogram last year.  She is unsure whether the new lump is truly new, or if it has been there for a while.   -Ambulatory referral for diagnostic mammogram   Orders Placed This Encounter  Procedures  . MM DIAG BREAST TOMO UNI RIGHT    INS UHC/MCR COSIGN REQ PF 06/03/18 BCG MASS / RT BR CA 2009 / NO IMPLANTS / NO NEEDS / SW SHARI @ [facility] / JR     Standing Status:   Future    Standing Expiration Date:   02/04/2020    Order Specific Question:   Reason for Exam (SYMPTOM  OR DIAGNOSIS REQUIRED)    Answer:   RIGHT BREAST MASS    Order Specific Question:   Preferred imaging location?    Answer:   [facility]  . HgB A1c   Everrett Coombe, MD,MS,  PGY3 12/07/2018 5:01 PM

## 2018-12-06 NOTE — Patient Instructions (Signed)
It was a pleasure seeing you today in our clinic. Here is the treatment plan we have discussed and agreed upon together: Follow up in 3 months for next A1c  You were sent for a mammogram.  Our clinic's number is [phone]. Please call with questions or concerns about what we discussed today.  Be well, Dr. Burr Medico

## 2018-12-07 ENCOUNTER — Encounter: Payer: Self-pay | Admitting: Student in an Organized Health Care Education/Training Program

## 2018-12-07 DIAGNOSIS — N63 Unspecified lump in unspecified breast: Secondary | ICD-10-CM | POA: Insufficient documentation

## 2018-12-07 NOTE — Assessment & Plan Note (Signed)
Insulin was increased by cardiology within the last month.  Continue current management for now.  Follow-up in 3 months for next A1c.

## 2018-12-07 NOTE — Assessment & Plan Note (Signed)
No red flags.  Continue current management.

## 2018-12-07 NOTE — Assessment & Plan Note (Addendum)
Patient does have a history of breast cancer and lumpectomy in the right breast back in 2009.  She does not do regular self examinations.  She has had regular mammogram screening most recently last year.  There is no abnormality on her mammogram last year.  She is unsure whether the new lump is truly new, or if it has been there for a while.   -Ambulatory referral for diagnostic mammogram

## 2018-12-07 NOTE — Assessment & Plan Note (Signed)
Well-controlled today.  Continue current management.

## 2018-12-13 ENCOUNTER — Ambulatory Visit
Admission: RE | Admit: 2018-12-13 | Discharge: 2018-12-13 | Disposition: A | Discharge status: Home / Self Care | Payer: Medicare Other | Source: Ambulatory Visit | Attending: Family Medicine | Admitting: Family Medicine

## 2018-12-13 DIAGNOSIS — N6489 Other specified disorders of breast: Secondary | ICD-10-CM | POA: Diagnosis not present

## 2018-12-13 DIAGNOSIS — R921 Mammographic calcification found on diagnostic imaging of breast: Secondary | ICD-10-CM | POA: Diagnosis not present

## 2018-12-13 DIAGNOSIS — N6310 Unspecified lump in the right breast, unspecified quadrant: Secondary | ICD-10-CM

## 2018-12-13 DIAGNOSIS — N63 Unspecified lump in unspecified breast: Secondary | ICD-10-CM

## 2018-12-13 DIAGNOSIS — Z853 Personal history of malignant neoplasm of breast: Secondary | ICD-10-CM | POA: Diagnosis not present

## 2018-12-27 DIAGNOSIS — E119 Type 2 diabetes mellitus without complications: Secondary | ICD-10-CM | POA: Diagnosis not present

## 2018-12-27 DIAGNOSIS — R0609 Other forms of dyspnea: Secondary | ICD-10-CM | POA: Diagnosis not present

## 2018-12-27 DIAGNOSIS — I2510 Atherosclerotic heart disease of native coronary artery without angina pectoris: Secondary | ICD-10-CM | POA: Diagnosis not present

## 2018-12-27 DIAGNOSIS — I252 Old myocardial infarction: Secondary | ICD-10-CM | POA: Diagnosis not present

## 2018-12-27 DIAGNOSIS — I10 Essential (primary) hypertension: Secondary | ICD-10-CM | POA: Diagnosis not present

## 2019-01-19 ENCOUNTER — Other Ambulatory Visit: Payer: Self-pay | Admitting: Student in an Organized Health Care Education/Training Program

## 2019-01-19 DIAGNOSIS — I10 Essential (primary) hypertension: Secondary | ICD-10-CM

## 2019-02-04 ENCOUNTER — Telehealth: Payer: Self-pay | Admitting: Cardiology

## 2019-02-04 ENCOUNTER — Other Ambulatory Visit: Payer: Self-pay | Admitting: *Deleted

## 2019-02-04 NOTE — Telephone Encounter (Signed)
° ° ° °  1. Which medications need to be refilled? (please list name of each medication and dose if known) Rosuvastatin calcium 40mg  once daily  2. Which pharmacy/location (including street and city if local pharmacy) is medication to be sent to? [facility] [address] [place]  3. Do they need a 30 day or 90 day supply? [place]

## 2019-02-11 ENCOUNTER — Other Ambulatory Visit: Payer: Self-pay | Admitting: Student in an Organized Health Care Education/Training Program

## 2019-02-16 ENCOUNTER — Other Ambulatory Visit: Payer: Self-pay

## 2019-02-16 ENCOUNTER — Ambulatory Visit (INDEPENDENT_AMBULATORY_CARE_PROVIDER_SITE_OTHER): Payer: Medicare Other | Admitting: Family Medicine

## 2019-02-16 ENCOUNTER — Encounter: Payer: Self-pay | Admitting: Family Medicine

## 2019-02-16 VITALS — BP 138/70 | HR 57 | Wt 134.0 lb

## 2019-02-16 DIAGNOSIS — IMO0002 Reserved for concepts with insufficient information to code with codable children: Secondary | ICD-10-CM

## 2019-02-16 DIAGNOSIS — M62838 Other muscle spasm: Secondary | ICD-10-CM | POA: Insufficient documentation

## 2019-02-16 DIAGNOSIS — Z794 Long term (current) use of insulin: Secondary | ICD-10-CM

## 2019-02-16 DIAGNOSIS — E1165 Type 2 diabetes mellitus with hyperglycemia: Secondary | ICD-10-CM

## 2019-02-16 LAB — POCT GLYCOSYLATED HEMOGLOBIN (HGB A1C): HbA1c, POC (controlled diabetic range): 7.7 % — AB (ref 0.0–7.0)

## 2019-02-16 MED ORDER — METFORMIN HCL ER 500 MG PO TB24: 500.0000 mg | ORAL_TABLET | Freq: Every day | ORAL | 3 refills | Status: DC

## 2019-02-16 NOTE — Assessment & Plan Note (Signed)
Patient is on a regimen of metformin 500 mg once a day and Lantus 30 units in the morning and 6 units of Humalog at night with dinner.  A1c's have come down recently.  They were in the 8-9 range and the last 2 have been between 7 and 8.  Today's was 7.7 which was an increase from 7.3 2 months ago.  Advised patient not to drink fruit juices as they often have as much sugar as sodas. - Change patient's metformin from immediate release to extended release due to reported decreased abdominal side effects with the extended release.  If patient is able to tolerate this medication better we can then go up on it to better manage her blood sugar. -Did not alter her insulin regiment as it is already close to being optimized and did not want to risk hypoglycemic events.

## 2019-02-16 NOTE — Progress Notes (Signed)
Dana Coleman Phone: [phone]   cc: Neck pain, diabetes  Subjective:   DM: taking metformin and insulin.  Takes insulin in the am and pm: 30U in am and 6 U at dinner.  She checks her blood sugar maybe once a day in the morning.  She takes the metformin once a day.  It gives her abdominal cramping and nausea.  Has been cutting back on bread.  Doesn't drink sodas, unless rare occasion.  Drinks apple juice  Neck pain: The pain has been going on for approximately 1 week.  She thinks she woke up with this pain.  She does not recall any heavy lifting or trauma associated with the pain.  She says the pain is mostly from the top of her neck to her right shoulder.  Sometimes she gets tingling sensations and numbness in her right arm, usually after waking up.  This happens about once a day and then resolves.  She states her right arm feels weaker, using the example of having more difficulty opening bottles.  When the patient turns her head to the right the pain is worse.  She says the pain is about a 6 or 7 out of 10 she took Tylenol but it did not help her    ROS: See HPI for pertinent positives and negatives  Past Medical History  Family history reviewed for today's visit. No changes.  Social history- patient is a non-smoker  Objective: BP 138/70    Pulse (!) 57    Wt 134 lb (60.8 kg)    SpO2 97%    BMI 24.51 kg/m  Gen: NAD, alert and oriented, cooperative with exam HEENT: NCAT,  CV: normal rate, regular rhythm. No murmurs, no rubs.  Resp: LCTAB, no wheezes, crackles. normal work of breathing GI: nontender to palpation, BS present, no guarding or organomegaly Msk: Upper extremity strength equal bilaterally, 5/5.  Patient endorses pain with rightward rotation of the neck as well as neck extension.  Patient has tenderness in the shoulder as well as trapezius muscles on the right side.  Neer test positive on the right side empty can test positive on the right side.  No  point tenderness along the vertebral column Skin: No rashes, no lesions Psych: Appropriate behavior  Assessment/Plan: Neck muscle spasm Patient likely has spasms of the neck muscles which could be due to degenerative arthritis of the cervical spine.  I believe the positive Neer and empty can test indicative of rotator cuff tendinopathy are unrelated to the current neck pain that she has in more likely related to her age and possible pre-existing arthritis.  Given the recent onset of this pain and lack of weakness on exam with minimal signs of radiculopathy such as occasional paresthesias that resolve I have opted for conservative management with Tylenol and naproxen with application of heat to the area for 2 weeks.  If his symptoms do not resolve in 2 weeks patient was advised to make an appointment and we can get imaging of the cervical spine.  Insulin dependent type 2 diabetes mellitus, uncontrolled (Dana Coleman) Patient is on a regimen of metformin 500 mg once a day and Lantus 30 units in the morning and 6 units of Humalog at night with dinner.  A1c's have come down recently.  They were in the 8-9 range and the last 2 have been between 7 and 8.  Today's was 7.7 which was an increase from 7.3 2 months ago.  Advised patient not to  drink fruit juices as they often have as much sugar as sodas. - Change patient's metformin from immediate release to extended release due to reported decreased abdominal side effects with the extended release.  If patient is able to tolerate this medication better we can then go up on it to better manage her blood sugar. -Did not alter her insulin regiment as it is already close to being optimized and did not want to risk hypoglycemic events.    Clemetine Marker, MD PGY-1

## 2019-02-16 NOTE — Assessment & Plan Note (Signed)
Patient likely has spasms of the neck muscles which could be due to degenerative arthritis of the cervical spine.  I believe the positive Neer and empty can test indicative of rotator cuff tendinopathy are unrelated to the current neck pain that she has in more likely related to her age and possible pre-existing arthritis.  Given the recent onset of this pain and lack of weakness on exam with minimal signs of radiculopathy such as occasional paresthesias that resolve I have opted for conservative management with Tylenol and naproxen with application of heat to the area for 2 weeks.  If his symptoms do not resolve in 2 weeks patient was advised to make an appointment and we can get imaging of the cervical spine.

## 2019-02-16 NOTE — Patient Instructions (Signed)
The pain in your neck is likely due to neck muscle spasms.  I would like you to apply a heating pad to it during the day and take an over the counter NSAID such as naproxen/alleve twice a day for two weeks.  If your pain is not better by then you can make another appointment to see [place].    Your a1c is 7.7, which is slightly up from last time.  I would like you to cut back on fruit juice like apple juice.  I have switched you to a longer acting metformin which may improve the side effects you feel from your current metformin.  If you tolerate this better we can start increasing the dose.

## 2019-02-22 DIAGNOSIS — I2510 Atherosclerotic heart disease of native coronary artery without angina pectoris: Secondary | ICD-10-CM | POA: Diagnosis not present

## 2019-02-22 DIAGNOSIS — I519 Heart disease, unspecified: Secondary | ICD-10-CM | POA: Diagnosis not present

## 2019-02-22 DIAGNOSIS — I10 Essential (primary) hypertension: Secondary | ICD-10-CM | POA: Diagnosis not present

## 2019-02-22 DIAGNOSIS — I252 Old myocardial infarction: Secondary | ICD-10-CM | POA: Diagnosis not present

## 2019-02-22 DIAGNOSIS — E119 Type 2 diabetes mellitus without complications: Secondary | ICD-10-CM | POA: Diagnosis not present

## 2019-02-25 ENCOUNTER — Other Ambulatory Visit: Payer: Self-pay

## 2019-02-25 NOTE — Patient Outreach (Signed)
[facility]) Care Management  02/25/2019  Dana Coleman 12/16/1940 [id number]   Medication Adherence call to Mr. Dana Coleman patient's telephone number is disconnected patient is past due under [place].   [place] Management Direct Dial [phone]  Fax [phone] [email or web address]

## 2019-03-01 DIAGNOSIS — I2510 Atherosclerotic heart disease of native coronary artery without angina pectoris: Secondary | ICD-10-CM | POA: Diagnosis not present

## 2019-03-01 DIAGNOSIS — I10 Essential (primary) hypertension: Secondary | ICD-10-CM | POA: Diagnosis not present

## 2019-03-01 DIAGNOSIS — E119 Type 2 diabetes mellitus without complications: Secondary | ICD-10-CM | POA: Diagnosis not present

## 2019-03-01 DIAGNOSIS — E785 Hyperlipidemia, unspecified: Secondary | ICD-10-CM | POA: Diagnosis not present

## 2019-04-12 DIAGNOSIS — H5203 Hypermetropia, bilateral: Secondary | ICD-10-CM | POA: Diagnosis not present

## 2019-04-12 DIAGNOSIS — E119 Type 2 diabetes mellitus without complications: Secondary | ICD-10-CM | POA: Diagnosis not present

## 2019-04-12 DIAGNOSIS — Z961 Presence of intraocular lens: Secondary | ICD-10-CM | POA: Diagnosis not present

## 2019-04-12 LAB — HM DIABETES EYE EXAM

## 2019-04-14 ENCOUNTER — Other Ambulatory Visit: Payer: Self-pay | Admitting: Student in an Organized Health Care Education/Training Program

## 2019-04-14 DIAGNOSIS — I10 Essential (primary) hypertension: Secondary | ICD-10-CM

## 2019-04-21 ENCOUNTER — Encounter: Payer: Self-pay | Admitting: Family Medicine

## 2019-04-26 ENCOUNTER — Other Ambulatory Visit: Payer: Self-pay | Admitting: Student in an Organized Health Care Education/Training Program

## 2019-04-26 DIAGNOSIS — Z1231 Encounter for screening mammogram for malignant neoplasm of breast: Secondary | ICD-10-CM

## 2019-05-24 DIAGNOSIS — E119 Type 2 diabetes mellitus without complications: Secondary | ICD-10-CM | POA: Diagnosis not present

## 2019-05-24 DIAGNOSIS — I5030 Unspecified diastolic (congestive) heart failure: Secondary | ICD-10-CM | POA: Diagnosis not present

## 2019-05-24 DIAGNOSIS — I10 Essential (primary) hypertension: Secondary | ICD-10-CM | POA: Diagnosis not present

## 2019-05-24 DIAGNOSIS — I252 Old myocardial infarction: Secondary | ICD-10-CM | POA: Diagnosis not present

## 2019-05-24 DIAGNOSIS — I2510 Atherosclerotic heart disease of native coronary artery without angina pectoris: Secondary | ICD-10-CM | POA: Diagnosis not present

## 2019-06-03 ENCOUNTER — Ambulatory Visit (INDEPENDENT_AMBULATORY_CARE_PROVIDER_SITE_OTHER): Payer: Medicare Other | Admitting: Family Medicine

## 2019-06-03 ENCOUNTER — Other Ambulatory Visit: Payer: Self-pay

## 2019-06-03 ENCOUNTER — Encounter: Payer: Self-pay | Admitting: Family Medicine

## 2019-06-03 VITALS — BP 112/58 | HR 65 | Wt 134.4 lb

## 2019-06-03 DIAGNOSIS — E1165 Type 2 diabetes mellitus with hyperglycemia: Secondary | ICD-10-CM

## 2019-06-03 DIAGNOSIS — IMO0002 Reserved for concepts with insufficient information to code with codable children: Secondary | ICD-10-CM

## 2019-06-03 DIAGNOSIS — Z794 Long term (current) use of insulin: Secondary | ICD-10-CM | POA: Diagnosis not present

## 2019-06-03 LAB — POCT GLYCOSYLATED HEMOGLOBIN (HGB A1C): HbA1c, POC (controlled diabetic range): 6.7 % (ref 0.0–7.0)

## 2019-06-03 MED ORDER — LANTUS SOLOSTAR 100 UNIT/ML ~~LOC~~ SOPN: PEN_INJECTOR | SUBCUTANEOUS | 9 refills | Status: DC

## 2019-06-03 NOTE — Progress Notes (Addendum)
    Subjective:  Dana Coleman is a 78 y.o. female who presents to the [facility] today for an A1c and diabetes check.  HPI: Patient reports that she feels like her sugars have been well controlled but she is unsure because her Accu-Chek has been not working correctly.  Her batteries were dead and we replaced and at this visit.  Patient ports she has been taking her Lantus as scheduled, 30 units in the morning.  And she is also been taking her Humalog 6 units at night and metformin 500 mg daily.  She reports great relief from the GI symptoms now that she is switched to the long-acting metformin.  Patient reports she lives at home alone, performs all of her own ADLs without assistance.  Patient denies headaches, chest pain, shortness of breath, nausea, vomiting, diarrhea, constipation, swelling in feet.  Objective:  Physical Exam: BP (!) 112/58   Pulse 65   Wt 134 lb 6.4 oz (61 kg)   SpO2 97%   BMI 24.58 kg/m   Gen: NAD, resting comfortably CV: RRR with no murmurs appreciated Pulm: NWOB, CTAB with no crackles, wheezes, or rhonchi GI: Normal bowel sounds present. Soft, Nontender, Nondistended. MSK: no edema, cyanosis, or clubbing noted Skin: warm, dry Neuro: grossly normal, moves all extremities Psych: Normal affect and thought content Diabetic Foot Exam - Simple   Simple Foot Form Diabetic Foot exam was performed with the following findings: Yes 06/03/2019  3:00 PM  Visual Inspection No deformities, no ulcerations, no other skin breakdown bilaterally: Yes Sensation Testing Intact to touch and monofilament testing bilaterally: Yes Pulse Check Posterior Tibialis and Dorsalis pulse intact bilaterally: Yes Comments     Results for orders placed or performed in visit on 06/03/19 (from the past 72 hour(s))  HgB A1c     Status: None   Collection Time: 06/03/19  2:45 PM  Result Value Ref Range   Hemoglobin A1C     HbA1c POC (<> result, manual entry)     HbA1c, POC (prediabetic  range)     HbA1c, POC (controlled diabetic range) 6.7 0.0 - 7.0 %     Assessment/Plan:  Insulin dependent type 2 diabetes mellitus, uncontrolled (HCC) Patient A1c is 6.7 today from 7.7 last visit.  Patient takes 30 units of Lantus in the morning and 6 units of Humalog at night.  Patient also takes 500 mg metformin daily. -Replace batteries in patient's glucometer so that she can check her blood sugars - Decrease Lantus to 24 units have concern for lows although patient denies any symptoms. - Patient scheduled for appointment with Dr. Grayce Sessions for medication optimization    Lab Orders     HgB A1c  Meds ordered this encounter  Medications  . Insulin Glargine (LANTUS SOLOSTAR) 100 UNIT/ML Solostar Pen    Sig: INJECT 24 UNITS INTO THE SKIN EVERY MORNING.    Dispense:  15 mL    Refill:  9     Gifford Shave, MD  PGY-1, [facility]  06/03/19 3:49 PM

## 2019-06-03 NOTE — Assessment & Plan Note (Addendum)
Patient A1c is 6.7 today from 7.7 last visit.  Patient takes 30 units of Lantus in the morning and 6 units of Humalog at night.  Patient also takes 500 mg metformin daily. -Replace batteries in patient's glucometer so that she can check her blood sugars - Decrease Lantus to 24 units have concern for lows although patient denies any symptoms. - Patient scheduled for appointment with Dr. Grayce Sessions for medication optimization

## 2019-06-03 NOTE — Patient Instructions (Addendum)
Thank you for coming to see me today.  Your A1c is 6.7 today which is a great improvement from 7.7 last visit.  I want to make a few changes to your diabetes management.  I would like to decrease your morning Lantus to 24 units from 30 units.  .  You can continue your nightly humalog 6 units.  I also want you to continue your 500 mg metformin once daily.  I would like you to schedule an appointment with our pharmacologist Dr. Valentina Lucks to fine-tune your diabetes management.   Diabetes Basics  Diabetes (diabetes mellitus) is a long-term (chronic) disease. It occurs when the body does not properly use sugar (glucose) that is released from food after you eat. Diabetes may be caused by one or both of these problems:  Your pancreas does not make enough of a hormone called insulin.  Your body does not react in a normal way to insulin that it makes. Insulin lets sugars (glucose) go into cells in your body. This gives you energy. If you have diabetes, sugars cannot get into cells. This causes high blood sugar (hyperglycemia). Follow these instructions at home: How is diabetes treated? You may need to take insulin or other diabetes medicines daily to keep your blood sugar in balance. Take your diabetes medicines every day as told by your doctor. List your diabetes medicines here: Diabetes medicines  Name of medicine: ______________________________ ? Amount (dose): _______________ Time (a.m./p.m.): _______________ Notes: ___________________________________  Name of medicine: ______________________________ ? Amount (dose): _______________ Time (a.m./p.m.): _______________ Notes: ___________________________________  Name of medicine: ______________________________ ? Amount (dose): _______________ Time (a.m./p.m.): _______________ Notes: ___________________________________ If you use insulin, you will learn how to give yourself insulin by injection. You may need to adjust the amount based on the food that  you eat. List the types of insulin you use here: Insulin  Insulin type: ______________________________ ? Amount (dose): _______________ Time (a.m./p.m.): _______________ Notes: ___________________________________  Insulin type: ______________________________ ? Amount (dose): _______________ Time (a.m./p.m.): _______________ Notes: ___________________________________  Insulin type: ______________________________ ? Amount (dose): _______________ Time (a.m./p.m.): _______________ Notes: ___________________________________  Insulin type: ______________________________ ? Amount (dose): _______________ Time (a.m./p.m.): _______________ Notes: ___________________________________  Insulin type: ______________________________ ? Amount (dose): _______________ Time (a.m./p.m.): _______________ Notes: ___________________________________ How do I manage my blood sugar?  Check your blood sugar levels using a blood glucose monitor as directed by your doctor. Your doctor will set treatment goals for you. Generally, you should have these blood sugar levels:  Before meals (preprandial): 80-130 mg/dL (4.4-7.2 mmol/L).  After meals (postprandial): below 180 mg/dL (10 mmol/L).  A1c level: less than 7%. Write down the times that you will check your blood sugar levels: Blood sugar checks  Time: _______________ Notes: ___________________________________  Time: _______________ Notes: ___________________________________  Time: _______________ Notes: ___________________________________  Time: _______________ Notes: ___________________________________  Time: _______________ Notes: ___________________________________  Time: _______________ Notes: ___________________________________  What do I need to know about low blood sugar? Low blood sugar is called hypoglycemia. This is when blood sugar is at or below 70 mg/dL (3.9 mmol/L). Symptoms may include:  Feeling: ? Hungry. ? Worried or nervous  (anxious). ? Sweaty and clammy. ? Confused. ? Dizzy. ? Sleepy. ? Sick to your stomach (nauseous).  Having: ? A fast heartbeat. ? A headache. ? A change in your vision. ? Tingling or no feeling (numbness) around the mouth, lips, or tongue. ? Jerky movements that you cannot control (seizure).  Having trouble with: ? Moving (coordination). ? Sleeping. ? Passing out (fainting). ? Getting upset easily (  irritability). Treating low blood sugar To treat low blood sugar, eat or drink something sugary right away. If you can think clearly and swallow safely, follow the 15:15 rule:  Take 15 grams of a fast-acting carb (carbohydrate). Talk with your doctor about how much you should take.  Some fast-acting carbs are: ? Sugar tablets (glucose pills). Take 3-4 glucose pills. ? 6-8 pieces of hard candy. ? 4-6 oz (120-150 mL) of fruit juice. ? 4-6 oz (120-150 mL) of regular (not diet) soda. ? 1 Tbsp (15 mL) honey or sugar.  Check your blood sugar 15 minutes after you take the carb.  If your blood sugar is still at or below 70 mg/dL (3.9 mmol/L), take 15 grams of a carb again.  If your blood sugar does not go above 70 mg/dL (3.9 mmol/L) after 3 tries, get help right away.  After your blood sugar goes back to normal, eat a meal or a snack within 1 hour. Treating very low blood sugar If your blood sugar is at or below 54 mg/dL (3 mmol/L), you have very low blood sugar (severe hypoglycemia). This is an emergency. Do not wait to see if the symptoms will go away. Get medical help right away. Call your local emergency services (911 in the [place] Do not drive yourself to the hospital. Questions to ask your health care provider  Do I need to meet with a diabetes educator?  What equipment will I need to care for myself at home?  What diabetes medicines do I need? When should I take them?  How often do I need to check my blood sugar?  What number can I call if I have questions?  When is my  next doctor's visit?  Where can I find a support group for people with diabetes? Where to find more information  American Diabetes Association: www.diabetes.org  American Association of Diabetes Educators: www.diabeteseducator.org/patient-resources Contact a doctor if:  Your blood sugar is at or above 240 mg/dL (13.3 mmol/L) for 2 days in a row.  You have been sick or have had a fever for 2 days or more, and you are not getting better.  You have any of these problems for more than 6 hours: ? You cannot eat or drink. ? You feel sick to your stomach (nauseous). ? You throw up (vomit). ? You have watery poop (diarrhea). Get help right away if:  Your blood sugar is lower than 54 mg/dL (3 mmol/L).  You get confused.  You have trouble: ? Thinking clearly. ? Breathing. Summary  Diabetes (diabetes mellitus) is a long-term (chronic) disease. It occurs when the body does not properly use sugar (glucose) that is released from food after digestion.  Take insulin and diabetes medicines as told.  Check your blood sugar every day, as often as told.  Keep all follow-up visits as told by your doctor. This is important. This information is not intended to replace advice given to you by your health care provider. Make sure you discuss any questions you have with your health care provider. Document Released: 01/01/2018 Document Revised: 11/19/2018 Document Reviewed: 01/01/2018 Elsevier Patient Education  2020 [employer].

## 2019-06-07 ENCOUNTER — Encounter: Payer: Self-pay | Admitting: Pharmacist

## 2019-06-07 ENCOUNTER — Ambulatory Visit (INDEPENDENT_AMBULATORY_CARE_PROVIDER_SITE_OTHER): Payer: Medicare Other | Admitting: Pharmacist

## 2019-06-07 ENCOUNTER — Other Ambulatory Visit: Payer: Self-pay

## 2019-06-07 DIAGNOSIS — E785 Hyperlipidemia, unspecified: Secondary | ICD-10-CM

## 2019-06-07 DIAGNOSIS — Z23 Encounter for immunization: Secondary | ICD-10-CM | POA: Diagnosis not present

## 2019-06-07 DIAGNOSIS — E1165 Type 2 diabetes mellitus with hyperglycemia: Secondary | ICD-10-CM

## 2019-06-07 DIAGNOSIS — I10 Essential (primary) hypertension: Secondary | ICD-10-CM

## 2019-06-07 DIAGNOSIS — IMO0002 Reserved for concepts with insufficient information to code with codable children: Secondary | ICD-10-CM

## 2019-06-07 DIAGNOSIS — M8580 Other specified disorders of bone density and structure, unspecified site: Secondary | ICD-10-CM

## 2019-06-07 MED ORDER — ONETOUCH DELICA LANCETS 33G MISC: 1.0000 | 99 refills | Status: DC | PRN

## 2019-06-07 MED ORDER — METOPROLOL SUCCINATE ER 50 MG PO TB24: 50.0000 mg | ORAL_TABLET | Freq: Every day | ORAL | 3 refills | Status: DC

## 2019-06-07 MED ORDER — EMPAGLIFLOZIN 10 MG PO TABS: 10.0000 mg | ORAL_TABLET | Freq: Every day | ORAL | 3 refills | Status: DC

## 2019-06-07 MED ORDER — ROSUVASTATIN CALCIUM 20 MG PO TABS: 20.0000 mg | ORAL_TABLET | Freq: Every day | ORAL | 3 refills | Status: DC

## 2019-06-07 MED ORDER — LANTUS SOLOSTAR 100 UNIT/ML ~~LOC~~ SOPN: 18.0000 [IU] | PEN_INJECTOR | SUBCUTANEOUS | 9 refills | Status: DC

## 2019-06-07 MED ORDER — HYDROCHLOROTHIAZIDE 12.5 MG PO TABS: 12.5000 mg | ORAL_TABLET | Freq: Every day | ORAL | 1 refills | Status: DC

## 2019-06-07 NOTE — Progress Notes (Signed)
    S:     Chief Complaint  Patient presents with  . Medication Management    diabetes    Patient arrives in good spirits, ambulating without assistance.  Presents for diabetes evaluation, education, and management Patient was referred and last seen by Primary Care Provider, Dr. Caron Presume on 06/03/2019.  Insurance coverage/medication affordability: [facility] Medicare  Patient reports adherence with medications.  Current diabetes medications include: lantus 24 units daily, humalog 6 units before dinner, metformin XR 500 mg daily  Current hypertension medications include: amlopidine 5 mg daily, losartan 50 mg daily, lopressor 50 mg daily Current hyperlipidemia medications include: rosuvastatin 20 mg   Patient denies hypoglycemic events   O:  Physical Exam Vitals signs reviewed.  Constitutional:      Appearance: Normal appearance.  Neurological:     Mental Status: She is alert.  Psychiatric:        Mood and Affect: Mood normal.        Behavior: Behavior normal.        Thought Content: Thought content normal.    Review of Systems  All other systems reviewed and are negative.   Lab Results  Component Value Date   HGBA1C 6.7 06/03/2019   Vitals:   06/07/19 1049  BP: 140/78  Pulse: 62  SpO2: 97%    Lipid Panel     Component Value Date/Time   CHOL 92 12/13/2017 0028   CHOL 142 10/24/2016 0000   TRIG 74 12/13/2017 0028   HDL 36 (L) 12/13/2017 0028   HDL 60 10/24/2016 0000   CHOLHDL 2.6 12/13/2017 0028   VLDL 15 12/13/2017 0028   LDLCALC 41 12/13/2017 0028   LDLCALC 69 10/24/2016 0000   LDLDIRECT 58 07/30/2012 1438    A/P: Diabetes currently controlled with an A1C of 6.7%. Patient is able to verbalize appropriate hypoglycemia management plan. Patient reports adherence with medication.  -Decreased dose of basal insulin lantus (insulin glargine) to 18 units daily in AM. -Continued rapid insulin lispro (insulin humalog) 6 units with dinner.   -Started SGLT2-I  Jardiance (generic name empagliflozin) 10 mg daily.  -Extensively discussed pathophysiology of DM, recommended lifestyle interventions, dietary effects on glycemic control -Counseled on s/sx of and management of hypoglycemia -Next A1C anticipated in 3 months.   ASCVD risk - secondary prevention in patient with DM. Last LDL is controlled. Aspirin is indicated.  -Continued aspirin 81 mg  -Continued rosuvastatin 20 mg.   Hypertension longstanding currently uncontrolled at 140/78.  BP goal < 130/80 mmHg. Patient reports adherence with medications.  -Started Hydrochlorothiazide 12.5 mg daily in the AM -Start Metoprolol Succinate (Toprol XL) 50 mg daily once patient has finished the remaining metoprolol tartrate (Lopressor) tablets -Continued Losartan 50 mg daily   History of osteopenia  -Patient's most recent DEXA scan was in 2013 - consider repeating a DEXA scan -Start OTC calcium citrate supplementation once daily   Written patient instructions provided.  Total time in face to face counseling 30 minutes.   Follow up Pharmacist/PCP for Clinic Visit in 16-month.  Pharmacy will conduct a telephone follow-up in 1 week. Patient seen with Murlean Iba, PharmD Candidate and Lorel Monaco, PharmD PGY-1 Resident.

## 2019-06-07 NOTE — Patient Instructions (Addendum)
Great seeing you today!  Start Empagliflozin (jardiance) 10 mg daily in the AM  Start insulin Lantus 18 units daily in AM  Start Hydrochlorothiazide 12.5 mg daily in AM  Start Metoprolol Succinate (Toprol XL) 50 mg daily once you have finished the remaining metoprolol tartrate (Lopressor) tablets  Start calcium citrate supplement once daily   Refilled Rosuvastatin 20 mg daily and OneTouch lancets  Follow-up with pharmacy in one month

## 2019-06-08 ENCOUNTER — Ambulatory Visit
Admission: RE | Admit: 2019-06-08 | Discharge: 2019-06-08 | Disposition: A | Discharge status: Home / Self Care | Payer: Medicare Other | Source: Ambulatory Visit | Attending: Hematology | Admitting: Hematology

## 2019-06-08 DIAGNOSIS — Z1231 Encounter for screening mammogram for malignant neoplasm of breast: Secondary | ICD-10-CM

## 2019-06-08 NOTE — Assessment & Plan Note (Signed)
History of osteopenia  -Patient's most recent DEXA scan was in 2013 - consider repeating a DEXA scan -Start OTC calcium citrate supplementation once daily  (HCTZ started today may also help with retaining calcium in the urine)

## 2019-06-08 NOTE — Assessment & Plan Note (Signed)
Hypertension longstanding currently uncontrolled at 140/78.  BP goal < 130/80 mmHg. Patient reports adherence with medications.  -Started Hydrochlorothiazide 12.5 mg daily in the AM -Start Metoprolol Succinate (Toprol XL) 50 mg daily once patient has finished the remaining metoprolol tartrate (Lopressor) tablets -Continued Losartan 50 mg daily

## 2019-06-08 NOTE — Progress Notes (Signed)
Reviewed: Agree with Dr. Koval's documentation and management. 

## 2019-06-08 NOTE — Assessment & Plan Note (Signed)
Diabetes currently controlled with an A1C of 6.7%. Patient is able to verbalize appropriate hypoglycemia management plan. Patient reports adherence with medication.  -Decreased dose of basal insulin lantus (insulin glargine) to 18 units daily in AM. -Continued rapid insulin lispro (insulin humalog) 6 units with dinner.   -Started SGLT2-I Jardiance (generic name empagliflozin) 10 mg daily.  -Extensively discussed pathophysiology of DM, recommended lifestyle interventions, dietary effects on glycemic control -Counseled on s/sx of and management of hypoglycemia -Next A1C anticipated in 3 months.

## 2019-06-30 ENCOUNTER — Other Ambulatory Visit: Payer: Self-pay | Admitting: Family Medicine

## 2019-06-30 DIAGNOSIS — I10 Essential (primary) hypertension: Secondary | ICD-10-CM

## 2019-07-03 ENCOUNTER — Other Ambulatory Visit: Payer: Self-pay | Admitting: Family Medicine

## 2019-07-03 DIAGNOSIS — I10 Essential (primary) hypertension: Secondary | ICD-10-CM

## 2019-07-12 ENCOUNTER — Ambulatory Visit (INDEPENDENT_AMBULATORY_CARE_PROVIDER_SITE_OTHER): Payer: Medicare Other | Admitting: Pharmacist

## 2019-07-12 ENCOUNTER — Encounter: Payer: Self-pay | Admitting: Pharmacist

## 2019-07-12 ENCOUNTER — Other Ambulatory Visit: Payer: Self-pay

## 2019-07-12 DIAGNOSIS — I10 Essential (primary) hypertension: Secondary | ICD-10-CM | POA: Diagnosis not present

## 2019-07-12 DIAGNOSIS — I2510 Atherosclerotic heart disease of native coronary artery without angina pectoris: Secondary | ICD-10-CM

## 2019-07-12 MED ORDER — NITROGLYCERIN 0.4 MG SL SUBL: 0.4000 mg | SUBLINGUAL_TABLET | SUBLINGUAL | 1 refills | Status: DC | PRN

## 2019-07-12 MED ORDER — AMLODIPINE BESYLATE 10 MG PO TABS: 10.0000 mg | ORAL_TABLET | Freq: Every day | ORAL | 3 refills | Status: DC

## 2019-07-12 NOTE — Assessment & Plan Note (Signed)
Hx of ASCVD - stable. -Sent in new Rx for nitroglycerin as the supply she had was expired

## 2019-07-12 NOTE — Progress Notes (Signed)
Reviewed: Agree with Dr. Koval's documentation and management. 

## 2019-07-12 NOTE — Assessment & Plan Note (Signed)
Hypertension longstanding and currently uncontrolled on current medications. BP Goal = <130 mmHg. Patient reports to be  adherent with current medications.  -Increased dose of amlodipine from 5mg  to 10mg  daily. Patient still has a supply of 5mg  dose, counseled to take 2 tabs daily until gone. -Continue HCTZ 12.5mg  daily -Continue losartan 50mg  daily -Continue metoprolol succinate 50mg  daily -F/u labs- consider a microalbuminuria at next visit. Patient was very skeptical of starting Jardiance. Talked to patient about the side effects, but also the kidney benefits of SGLT2 inhibitors.  Consider Metoprolol to carvedilol switch OR losartan dose increase at next visit.

## 2019-07-12 NOTE — Patient Instructions (Addendum)
It was great seeing you today!  Changes: Increase amlodipine from 5 mg daily to 10 mg daily. Finish using the 5 mg dose by taking two a day until gone. Then take the 10 mg dose once daily.  We sent in a new prescription to your pharmacy for nitroglycerin.  Please set up a follow up with Dr. Caron Presume in about a month from now and please call if you have any questions!

## 2019-07-12 NOTE — Progress Notes (Signed)
S:    Patient arrives in good spirits and self ambulating.    Presents to the clinic for hypertension evaluation, counseling, and management and diabetes management. Patient was referred and last seen by Primary Care Provider on 8/21. Last Rx Clinic visit was 8/25.   Patient reports adherence with medications.  Current BP Medications include:  Amlodipine 5mg  daily, HCTZ 12.5mg  daily, losartan 50mg  daily, metoprolol succinate 50 mg daily   ASCVD risk factors include: CAD, htn Denies chest pain - requests refill of expired NTG.   O:  Physical Exam Vitals signs reviewed.  Constitutional:      Appearance: Normal appearance. She is normal weight.  Musculoskeletal:        General: No swelling.  Neurological:     Mental Status: She is alert.  Psychiatric:        Mood and Affect: Mood normal.        Behavior: Behavior normal.        Thought Content: Thought content normal.        Judgment: Judgment normal.    Review of Systems  All other systems reviewed and are negative.   Home BP readings: patient has a monitor at home, but does not use it daily. Last home reading was 138/78 but she was not sure when this was. Counseled patient to monitor and record BP at home and bring with her to next visit.  Last 3 Office BP readings: BP Readings from Last 3 Encounters:  07/12/19 (!) 152/76  06/07/19 140/78  06/03/19 (!) 112/58    Diabetes: -Patient states she does not take Jardiance 10mg  due to the side effects she read about online (yeast infection) -Patient brought in glucometer. Checks BG in mornings only, typically in the 100s-120s. There was one value as high as 273 and one as low as 69. -when asked, patient stated she did get dizzy once.  -patient was able to communicate how to manage hypoglycemia   BMET    Component Value Date/Time   NA 141 12/13/2017 0028   NA 145 (H) 10/24/2016 0000   NA 142 08/15/2013 1409   K 3.9 12/13/2017 0028   K 3.9 08/15/2013 1409   CL 105  12/13/2017 0028   CL 105 04/04/2013 1611   CO2 24 12/13/2017 0028   CO2 24 08/15/2013 1409   GLUCOSE 143 (H) 12/13/2017 0028   GLUCOSE 123 08/15/2013 1409   GLUCOSE 162 (H) 04/04/2013 1611   BUN 15 12/13/2017 0028   BUN 12 10/24/2016 0000   BUN 12.2 08/15/2013 1409   CREATININE 1.01 (H) 12/13/2017 0028   CREATININE 0.91 12/08/2014 1412   CREATININE 0.9 08/15/2013 1409   CALCIUM 9.0 12/13/2017 0028   CALCIUM 9.8 08/15/2013 1409   GFRNONAA 53 (L) 12/13/2017 0028   GFRAA >60 12/13/2017 0028     Clinical ASCVD: Yes  The ASCVD Risk score (Goff DC Jr., et al., 2013) failed to calculate for the following reasons:   The patient has a prior MI or stroke diagnosis   A/P: Hypertension longstanding and currently uncontrolled on current medications. BP Goal = <130 mmHg. Patient reports to be  adherent with current medications.  -Increased dose of amlodipine from 5mg  to 10mg  daily. Patient still has a supply of 5mg  dose, counseled to take 2 tabs daily until gone. -Continue HCTZ 12.5mg  daily -Continue losartan 50mg  daily -Continue metoprolol succinate 50mg  daily -F/u labs- consider a microalbuminuria at next visit. Patient was very skeptical of starting Jardiance. Talked to patient  about the side effects, but also the kidney benefits of SGLT2 inhibitors.  Consider Metoprolol to carvedilol switch OR losartan dose increase at next visit.   Hx of ASCVD - stable. -Sent in new Rx for nitroglycerin as the supply she had was expired  -Counseled to continue lifestyle modifications for blood pressure control   Results reviewed and written information provided.   Total time in face-to-face counseling 50 minutes.   F/U Clinic Visit in one month.  Patient seen with Murlean Iba, PharmD Candidate and Gerre Pebbles, PharmD PGY-1 Resident.

## 2019-07-23 ENCOUNTER — Other Ambulatory Visit: Payer: Self-pay | Admitting: Family Medicine

## 2019-07-23 DIAGNOSIS — I10 Essential (primary) hypertension: Secondary | ICD-10-CM

## 2019-08-18 ENCOUNTER — Other Ambulatory Visit: Payer: Self-pay | Admitting: Family Medicine

## 2019-08-18 DIAGNOSIS — I10 Essential (primary) hypertension: Secondary | ICD-10-CM

## 2019-08-24 DIAGNOSIS — E785 Hyperlipidemia, unspecified: Secondary | ICD-10-CM | POA: Diagnosis not present

## 2019-08-24 DIAGNOSIS — I10 Essential (primary) hypertension: Secondary | ICD-10-CM | POA: Diagnosis not present

## 2019-08-24 DIAGNOSIS — I5030 Unspecified diastolic (congestive) heart failure: Secondary | ICD-10-CM | POA: Diagnosis not present

## 2019-08-24 DIAGNOSIS — I2510 Atherosclerotic heart disease of native coronary artery without angina pectoris: Secondary | ICD-10-CM | POA: Diagnosis not present

## 2019-08-24 DIAGNOSIS — E119 Type 2 diabetes mellitus without complications: Secondary | ICD-10-CM | POA: Diagnosis not present

## 2019-09-14 ENCOUNTER — Other Ambulatory Visit: Payer: Self-pay | Admitting: Family Medicine

## 2019-09-14 DIAGNOSIS — I10 Essential (primary) hypertension: Secondary | ICD-10-CM

## 2019-10-07 ENCOUNTER — Other Ambulatory Visit: Payer: Self-pay | Admitting: Family Medicine

## 2019-10-07 DIAGNOSIS — I10 Essential (primary) hypertension: Secondary | ICD-10-CM

## 2019-10-28 ENCOUNTER — Ambulatory Visit (INDEPENDENT_AMBULATORY_CARE_PROVIDER_SITE_OTHER): Payer: Medicare Other | Admitting: Family Medicine

## 2019-10-28 ENCOUNTER — Encounter: Payer: Self-pay | Admitting: Family Medicine

## 2019-10-28 ENCOUNTER — Other Ambulatory Visit: Payer: Self-pay

## 2019-10-28 VITALS — BP 136/82 | HR 70 | Wt 136.8 lb

## 2019-10-28 DIAGNOSIS — E1165 Type 2 diabetes mellitus with hyperglycemia: Secondary | ICD-10-CM | POA: Diagnosis not present

## 2019-10-28 DIAGNOSIS — I2510 Atherosclerotic heart disease of native coronary artery without angina pectoris: Secondary | ICD-10-CM | POA: Diagnosis not present

## 2019-10-28 DIAGNOSIS — Z794 Long term (current) use of insulin: Secondary | ICD-10-CM

## 2019-10-28 DIAGNOSIS — IMO0002 Reserved for concepts with insufficient information to code with codable children: Secondary | ICD-10-CM

## 2019-10-28 LAB — POCT GLYCOSYLATED HEMOGLOBIN (HGB A1C): HbA1c, POC (controlled diabetic range): 8.2 % — AB (ref 0.0–7.0)

## 2019-10-28 MED ORDER — METFORMIN HCL ER 500 MG PO TB24: 500.0000 mg | ORAL_TABLET | Freq: Two times a day (BID) | ORAL | 0 refills | Status: DC

## 2019-10-28 NOTE — Assessment & Plan Note (Signed)
Diabetes currently not controlled with most recent A1c of 8.2 up from 6.7.  Patient reports great adherence to insulin and Metformin.  She reports that she was started on Jardiance but never took it because of concerns over possible side effects.  Patient reports that she knows her Lantus was cut down to 18 but she has continued to take 24 units daily -Continue Lantus at 24 units daily and Humalog as prescribed. -Increase Metformin dose to 500 mg XR twice daily, once with breakfast and once with dinner. -Recheck hemoglobin A1c in 3 months -Consider further diabetes management with Dr. Valentina Lucks

## 2019-10-28 NOTE — Assessment & Plan Note (Signed)
Patient with history of CAD.  Last lipid panel done in 2019.  Patient on losartan, Aspirin, Norvasc, metoprolol, Crestor. -Annual BMP to evaluate kidney function -Repeat lipid panel per pharmacy's recs

## 2019-10-28 NOTE — Patient Instructions (Signed)
It was a pleasure to meet you today.  Your hemoglobin A1c was up to 8.2 from 6 on your last visit.  I know that you are prescribed Jardiance and have not been taking it because of the possible side effects.  What I would like you to do if you are not going to take the Jardiance is take 500 mg of Metformin twice daily with breakfast and dinner.  We will see you back in 3 months for a follow-up diabetes checkup.  While you were here we are also going to get some lab work because you have not had a lipid panel done since 2019.  You are also going to have a BMP to check your kidney function.  I hope you have a wonderful afternoon

## 2019-10-28 NOTE — Progress Notes (Signed)
    Subjective:  [name] is a 79 y.o. female who presents to the [facility] today for a diabetes checkup.   HPI: Patient reports that she has been taking her medications as prescribed except for the Jardiance because she does not want any of the side effects including the urinary tract infections.  She reports that she checks her blood glucoses approximately once a week and that she has not had any low blood sugars.  She says that she has taken higher levels of Metformin in the past and that she was "taking 3 a day" but that she was having bad GI symptoms and stopped taking it.  Objective:  Physical Exam: BP 136/82   Pulse 70   Wt 136 lb 12.8 oz (62.1 kg)   SpO2 98%   BMI 25.17 kg/m   Gen: NAD, resting comfortably CV: RRR with no murmurs appreciated Pulm: NWOB, CTAB with no crackles, wheezes, or rhonchi GI: Normal bowel sounds present. Soft, Nontender, Nondistended. Skin: warm, dry Neuro: grossly normal, moves all extremities Psych: Normal affect and thought content  Results for orders placed or performed in visit on 10/28/19 (from the past 72 hour(s))  HgB A1c     Status: Abnormal   Collection Time: 10/28/19  2:30 PM  Result Value Ref Range   Hemoglobin A1C     HbA1c POC (<> result, manual entry)     HbA1c, POC (prediabetic range)     HbA1c, POC (controlled diabetic range) 8.2 (A) 0.0 - 7.0 %     Assessment/Plan:  Insulin dependent type 2 diabetes mellitus, uncontrolled (HCC) Diabetes currently not controlled with most recent A1c of 8.2 up from 6.7.  Patient reports great adherence to insulin and Metformin.  She reports that she was started on Jardiance but never took it because of concerns over possible side effects.  Patient reports that she knows her Lantus was cut down to 18 but she has continued to take 24 units daily -Continue Lantus at 24 units daily and Humalog as prescribed. -Increase Metformin dose to 500 mg XR twice daily, once with breakfast and once with  dinner. -Recheck hemoglobin A1c in 3 months -Consider further diabetes management with Dr. Valentina Lucks  CAD (coronary artery disease), native coronary artery Patient with history of CAD.  Last lipid panel done in 2019.  Patient on losartan, Aspirin, Norvasc, metoprolol, Crestor. -Annual BMP to evaluate kidney function -Repeat lipid panel per pharmacy's recs    Lab Orders     Lipid Panel     Basic Metabolic Panel     HgB [id number]  Meds ordered this encounter  Medications  . metFORMIN (GLUCOPHAGE-XR) 500 MG 24 hr tablet    Sig: Take 1 tablet (500 mg total) by mouth 2 (two) times daily. With breakfast and dinner    Dispense:  180 tablet    Refill:  0      Gifford Shave, MD  PGY-1, [facility]  10/28/19 3:23 PM

## 2019-10-29 LAB — BASIC METABOLIC PANEL
BUN/Creatinine Ratio: 12 (ref 12–28)
BUN: 13 mg/dL (ref 8–27)
CO2: 26 mmol/L (ref 20–29)
Calcium: 9.9 mg/dL (ref 8.7–10.3)
Chloride: 101 mmol/L (ref 96–106)
Creatinine, Ser: 1.09 mg/dL — ABNORMAL HIGH (ref 0.57–1.00)
GFR calc Af Amer: 57 mL/min/{1.73_m2} — ABNORMAL LOW (ref >59)
GFR calc non Af Amer: 49 mL/min/{1.73_m2} — ABNORMAL LOW (ref >59)
Glucose: 122 mg/dL — ABNORMAL HIGH (ref 65–99)
Potassium: 4.1 mmol/L (ref 3.5–5.2)
Sodium: 141 mmol/L (ref 134–144)

## 2019-10-29 LAB — LIPID PANEL
Chol/HDL Ratio: 2.5 ratio (ref 0.0–4.4)
Cholesterol, Total: 135 mg/dL (ref 100–199)
HDL: 53 mg/dL (ref >39)
LDL Chol Calc (NIH): 63 mg/dL (ref 0–99)
Triglycerides: 104 mg/dL (ref 0–149)
VLDL Cholesterol Cal: 19 mg/dL (ref 5–40)

## 2019-11-03 ENCOUNTER — Ambulatory Visit: Payer: Medicare Other | Attending: Internal Medicine

## 2019-11-03 DIAGNOSIS — Z23 Encounter for immunization: Secondary | ICD-10-CM | POA: Insufficient documentation

## 2019-11-03 NOTE — Progress Notes (Signed)
   Covid-19 Vaccination Clinic  Name:  JOHANNAH BOLHUIS    MRN: [id number] DOB: 1941/09/29  11/03/2019  Ms. Staats was observed post Covid-19 immunization for 15 minutes without incidence. She was provided with Vaccine Information Sheet and instruction to access the V-Safe system.   Ms. Shortall was instructed to call 911 with any severe reactions post vaccine: Marland Kitchen Difficulty breathing  . Swelling of your face and throat  . A fast heartbeat  . A bad rash all over your body  . Dizziness and weakness    Immunizations Administered    Name Date Dose VIS Date Route   Pfizer COVID-19 Vaccine 11/03/2019 10:05 AM 0.3 mL 09/23/2019 Intramuscular   Manufacturer: [place]   Lot: [id number]   [place]: [id number]

## 2019-11-14 ENCOUNTER — Ambulatory Visit (AMBULATORY_SURGERY_CENTER): Payer: Medicare Other | Admitting: *Deleted

## 2019-11-14 ENCOUNTER — Other Ambulatory Visit: Payer: Self-pay

## 2019-11-14 VITALS — Temp 96.8°F | Ht 61.5 in | Wt 136.0 lb

## 2019-11-14 DIAGNOSIS — Z1159 Encounter for screening for other viral diseases: Secondary | ICD-10-CM

## 2019-11-14 DIAGNOSIS — Z86010 Personal history of colonic polyps: Secondary | ICD-10-CM

## 2019-11-14 NOTE — Progress Notes (Signed)

## 2019-11-22 ENCOUNTER — Ambulatory Visit: Payer: Self-pay | Admitting: Podiatry

## 2019-11-22 DIAGNOSIS — E119 Type 2 diabetes mellitus without complications: Secondary | ICD-10-CM | POA: Diagnosis not present

## 2019-11-22 DIAGNOSIS — I2510 Atherosclerotic heart disease of native coronary artery without angina pectoris: Secondary | ICD-10-CM | POA: Diagnosis not present

## 2019-11-22 DIAGNOSIS — I10 Essential (primary) hypertension: Secondary | ICD-10-CM | POA: Diagnosis not present

## 2019-11-22 DIAGNOSIS — I5030 Unspecified diastolic (congestive) heart failure: Secondary | ICD-10-CM | POA: Diagnosis not present

## 2019-11-22 DIAGNOSIS — E785 Hyperlipidemia, unspecified: Secondary | ICD-10-CM | POA: Diagnosis not present

## 2019-11-24 ENCOUNTER — Ambulatory Visit: Payer: Medicare Other | Attending: Internal Medicine

## 2019-11-24 DIAGNOSIS — Z23 Encounter for immunization: Secondary | ICD-10-CM | POA: Insufficient documentation

## 2019-11-24 NOTE — Progress Notes (Signed)
   Covid-19 Vaccination Clinic  Name:  Dana Coleman    MRN: [id number] DOB: 02/15/1941  11/24/2019  Dana Coleman was observed post Covid-19 immunization for 15 minutes without incidence. She was provided with Vaccine Information Sheet and instruction to access the V-Safe system.   Dana Coleman was instructed to call 911 with any severe reactions post vaccine: Marland Kitchen Difficulty breathing  . Swelling of your face and throat  . A fast heartbeat  . A bad rash all over your body  . Dizziness and weakness    Immunizations Administered    Name Date Dose VIS Date Route   Pfizer COVID-19 Vaccine 11/24/2019 10:26 AM 0.3 mL 09/23/2019 Intramuscular   Manufacturer: [employer]   Lot: [id number]   [place]: [id number]

## 2019-12-02 ENCOUNTER — Other Ambulatory Visit: Payer: Self-pay | Admitting: Gastroenterology

## 2019-12-02 ENCOUNTER — Ambulatory Visit (INDEPENDENT_AMBULATORY_CARE_PROVIDER_SITE_OTHER): Payer: Medicare Other

## 2019-12-02 DIAGNOSIS — Z1159 Encounter for screening for other viral diseases: Secondary | ICD-10-CM | POA: Diagnosis not present

## 2019-12-03 LAB — SARS CORONAVIRUS 2 (TAT 6-24 HRS): SARS Coronavirus 2: NEGATIVE

## 2019-12-06 ENCOUNTER — Encounter: Payer: Self-pay | Admitting: Gastroenterology

## 2019-12-06 ENCOUNTER — Other Ambulatory Visit: Payer: Self-pay

## 2019-12-06 ENCOUNTER — Ambulatory Visit (AMBULATORY_SURGERY_CENTER): Payer: Medicare Other | Admitting: Gastroenterology

## 2019-12-06 VITALS — BP 142/84 | HR 66 | Temp 96.2°F | Resp 13 | Ht 61.8 in | Wt 136.0 lb

## 2019-12-06 DIAGNOSIS — D127 Benign neoplasm of rectosigmoid junction: Secondary | ICD-10-CM | POA: Diagnosis not present

## 2019-12-06 DIAGNOSIS — D120 Benign neoplasm of cecum: Secondary | ICD-10-CM | POA: Diagnosis not present

## 2019-12-06 DIAGNOSIS — E119 Type 2 diabetes mellitus without complications: Secondary | ICD-10-CM | POA: Diagnosis not present

## 2019-12-06 DIAGNOSIS — Z860100 Personal history of colon polyps, unspecified: Secondary | ICD-10-CM

## 2019-12-06 MED ORDER — SODIUM CHLORIDE 0.9 % IV SOLN: 500.0000 mL | Freq: Once | INTRAVENOUS | Status: DC

## 2019-12-06 NOTE — Op Note (Signed)
[place] Patient Name: [name] Procedure Date: 12/06/2019 10:35 AM MRN: [id number] Endoscopist: Justice Britain , MD Age: 79 Referring MD:  Date of Birth: 31-May-1941 Gender: Female Account #: [id number] Procedure:                Colonoscopy Indications:              Surveillance: Personal history of adenomatous                            polyps on last colonoscopy > 5 years ago Medicines:                Monitored Anesthesia Care Procedure:                Pre-Anesthesia Assessment:                           - Prior to the procedure, a History and Physical                            was performed, and patient medications and                            allergies were reviewed. The patient's tolerance of                            previous anesthesia was also reviewed. The risks                            and benefits of the procedure and the sedation                            options and risks were discussed with the patient.                            All questions were answered, and informed consent                            was obtained. Prior Anticoagulants: The patient has                            taken no previous anticoagulant or antiplatelet                            agents except for aspirin. ASA Grade Assessment:                            III - A patient with severe systemic disease. After                            reviewing the risks and benefits, the patient was                            deemed in satisfactory condition to undergo the  procedure.                           After obtaining informed consent, the colonoscope                            was passed under direct vision. Throughout the                            procedure, the patient's blood pressure, pulse, and                            oxygen saturations were monitored continuously. The                            Colonoscope was introduced through the anus  and                            advanced to the the cecum, identified by palpation.                            The colonoscopy was performed without difficulty.                            The patient tolerated the procedure. The quality of                            the bowel preparation was adequate. The ileocecal                            valve, appendiceal orifice, and rectum were                            photographed. Scope In: 10:44:43 AM Scope Out: 11:04:30 AM Scope Withdrawal Time: 0 hours 13 minutes 45 seconds  Total Procedure Duration: 0 hours 19 minutes 47 seconds  Findings:                 Skin tags were found on perianal exam.                           The digital rectal exam findings include                            hemorrhoids. Pertinent negatives include no                            palpable rectal lesions.                           Three sessile polyps were found in the                            recto-sigmoid colon (1) and cecum (2). The polyps  were 2 to 4 mm in size. These polyps were removed                            with a cold snare. Resection and retrieval were                            complete.                           Normal mucosa was found in the entire colon                            otherwise.                           Multiple small-mouthed diverticula were found in                            the recto-sigmoid colon, sigmoid colon, descending                            colon, transverse colon and ascending colon.                           Non-bleeding non-thrombosed external and internal                            hemorrhoids were found during retroflexion, during                            perianal exam and during digital exam. The                            hemorrhoids were Grade II (internal hemorrhoids                            that prolapse but reduce spontaneously). Complications:            No immediate  complications. Estimated Blood Loss:     Estimated blood loss was minimal. Impression:               - Perianal skin tags found on perianal exam.                           - Hemorrhoids found on digital rectal exam.                           - Three 2 to 4 mm polyps at the recto-sigmoid colon                            and in the cecum, removed with a cold snare.                            Resected and retrieved.                           -  Normal mucosa in the entire examined colon.                           - Diverticulosis in the recto-sigmoid colon, in the                            sigmoid colon, in the descending colon, in the                            transverse colon and in the ascending colon.                           - Non-bleeding non-thrombosed external and internal                            hemorrhoids. Recommendation:           - The patient will be observed post-procedure,                            until all discharge criteria are met.                           - Discharge patient to home.                           - Patient has a contact number available for                            emergencies. The signs and symptoms of potential                            delayed complications were discussed with the                            patient. Return to normal activities tomorrow.                            Written discharge instructions were provided to the                            patient.                           - High fiber diet.                           - Continue present medications.                           - Await pathology results.                           - Repeat colonoscopy date to be determined after                            pending  pathology results are reviewed for                            surveillance based on pathology results. If                            adenomatous tissue is found in all removed polyps                            then would  consider a 3-year follow up otherwise a                            5/7 year follow up due to history of colon polyps                            can be discussed. This is pending the patient's                            clinical history and comorbidities at the time.                           - The findings and recommendations were discussed                            with the patient. Justice Britain, MD 12/06/2019 11:13:07 AM

## 2019-12-06 NOTE — Patient Instructions (Addendum)
Use Fibercon 1 tablet by mouth daily (this is an over-the-counter medication.) May use PreparationH for hemorrhoid discomfort or bleeding.   Handouts provided on polyps, diverticulosis, hemorrhoids and high-fiber diet.   YOU HAD AN ENDOSCOPIC PROCEDURE TODAY AT [facility]:   Refer to the procedure report that was given to you for any specific questions about what was found during the examination.  If the procedure report does not answer your questions, please call your gastroenterologist to clarify.  If you requested that your care partner not be given the details of your procedure findings, then the procedure report has been included in a sealed envelope for you to review at your convenience later.  YOU SHOULD EXPECT: Some feelings of bloating in the abdomen. Passage of more gas than usual.  Walking can help get rid of the air that was put into your GI tract during the procedure and reduce the bloating. If you had a lower endoscopy (such as a colonoscopy or flexible sigmoidoscopy) you may notice spotting of blood in your stool or on the toilet paper. If you underwent a bowel prep for your procedure, you may not have a normal bowel movement for a few days.  Please Note:  You might notice some irritation and congestion in your nose or some drainage.  This is from the oxygen used during your procedure.  There is no need for concern and it should clear up in a day or so.  SYMPTOMS TO REPORT IMMEDIATELY:   Following lower endoscopy (colonoscopy or flexible sigmoidoscopy):  Excessive amounts of blood in the stool  Significant tenderness or worsening of abdominal pains  Swelling of the abdomen that is new, acute  Fever of 100F or higher   For urgent or emergent issues, a gastroenterologist can be reached at any hour by calling [phone].   DIET:  We do recommend a small meal at first, but then you may proceed to a High-fiber diet.  Drink plenty of fluids but you should avoid  alcoholic beverages for 24 hours.  ACTIVITY:  You should plan to take it easy for the rest of today and you should NOT DRIVE or use heavy machinery until tomorrow (because of the sedation medicines used during the test).    FOLLOW UP: Our staff will call the number listed on your records 48-72 hours following your procedure to check on you and address any questions or concerns that you may have regarding the information given to you following your procedure. If we do not reach you, we will leave a message.  We will attempt to reach you two times.  During this call, we will ask if you have developed any symptoms of COVID 19. If you develop any symptoms (ie: fever, flu-like symptoms, shortness of breath, cough etc.) before then, please call [phone].  If you test positive for Covid 19 in the 2 weeks post procedure, please call and report this information to [place].    If any biopsies were taken you will be contacted by phone or by letter within the next 1-3 weeks.  Please call us at [phone] if you have not heard about the biopsies in 3 weeks.    SIGNATURES/CONFIDENTIALITY: You and/or your care partner have signed paperwork which will be entered into your electronic medical record.  These signatures attest to the fact that that the information above on your After Visit Summary has been reviewed and is understood.  Full responsibility of the confidentiality of this discharge information  lies with you and/or your care-partner.

## 2019-12-06 NOTE — Progress Notes (Signed)
Report to PACU, RN, vss, BBS= Clear.  

## 2019-12-06 NOTE — Progress Notes (Signed)
Temp by JB, vitals by DT   Pt's states no medical or surgical changes since previsit or office visit.

## 2019-12-06 NOTE — Progress Notes (Signed)
Called to room to assist during endoscopic procedure.  Patient ID and intended procedure confirmed with present staff. Received instructions for my participation in the procedure from the performing physician.  

## 2019-12-08 ENCOUNTER — Telehealth: Payer: Self-pay | Admitting: *Deleted

## 2019-12-08 NOTE — Telephone Encounter (Signed)
  Follow up Call-  Call back number 12/06/2019  Post procedure Call Back phone  # [phone]  Permission to leave phone message Yes  Some recent data might be hidden     Patient questions:  Do you have a fever, pain , or abdominal swelling? No. Pain Score  0 *  Have you tolerated food without any problems? Yes.    Have you been able to return to your normal activities? Yes.    Do you have any questions about your discharge instructions: Diet   No. Medications  No. Follow up visit  No.  Do you have questions or concerns about your Care? No.  Actions: * If pain score is 4 or above: 1. No action needed, pain <4.Have you developed a fever since your procedure? no  2.   Have you had an respiratory symptoms (SOB or cough) since your procedure? no  3.   Have you tested positive for COVID 19 since your procedure no  4.   Have you had any family members/close contacts diagnosed with the COVID 19 since your procedure?  no   If yes to any of these questions please route to Joylene John, RN and Alphonsa Gin, Therapist, sports.

## 2019-12-10 ENCOUNTER — Encounter: Payer: Self-pay | Admitting: Gastroenterology

## 2020-01-06 ENCOUNTER — Other Ambulatory Visit: Payer: Self-pay

## 2020-01-06 ENCOUNTER — Encounter: Payer: Self-pay | Admitting: Family Medicine

## 2020-01-06 ENCOUNTER — Ambulatory Visit (INDEPENDENT_AMBULATORY_CARE_PROVIDER_SITE_OTHER): Payer: Medicare Other | Admitting: Family Medicine

## 2020-01-06 DIAGNOSIS — B351 Tinea unguium: Secondary | ICD-10-CM | POA: Diagnosis not present

## 2020-01-06 MED ORDER — CICLOPIROX 8 % EX SOLN: Freq: Every day | CUTANEOUS | 0 refills | Status: DC

## 2020-01-06 MED ORDER — CICLOPIROX OLAMINE 0.77 % EX CREA: TOPICAL_CREAM | Freq: Two times a day (BID) | CUTANEOUS | 0 refills | Status: DC

## 2020-01-06 NOTE — Progress Notes (Signed)
    SUBJECTIVE:   CHIEF COMPLAINT / HPI:   Nail complaints Patient reports that her nail looks like it has been coming up on her right first toe for approximately 1 year.  It is now started to happen to her left great toenail.  She is concerned because it is going to be summer and she will be walking in sandals on does not want her toenails to look so poor.  She says she occasionally has pain in the toenails, itching.  She has been using hydrogen peroxide but has tried no other over-the-counter medications.  PERTINENT  PMH / PSH: None  OBJECTIVE:   BP 124/62   Pulse 75   Ht 5\' 1"  (1.549 m)   Wt 133 lb (60.3 kg)   SpO2 98%   BMI 25.13 kg/m   General: Alert, oriented, no acute distress Cardio: Regular rate and rhythm, no murmurs appreciated Respiratory: Clear to auscultation bilaterally, no wheezes appreciated Extremities: Great toe on right and left foot both have a deep bending nails.  They have been present for approximately a year.  Procedure note Patient was consented.  Portion of toenail was removed using sterile gloves, sterile forceps and sterile scissors.  Sample was split into 2 pieces and bottled in separate containers to be sent to the lab for biopsy. Media Information        ASSESSMENT/PLAN:   Onychomycosis Patient here reporting onychomycosis of right great toe as well as left great toe.  This is been worsening over the last year on the right great toe and has now started on the left great toe.  Patient reports it is occasionally painful and itches. -Nail clipping sent for biopsy -Prescribed multivitamin along with cyclothyrox   Gifford Shave, MD [place]

## 2020-01-06 NOTE — Patient Instructions (Addendum)
It was a pleasure to see you today.  I took a sample from your nail and sent to the lab to evaluate for fungal infection.  I will call you with those results when they come in.  If it worsens or you notice redness or swelling please seek medical attention.  I have prescribed you a medication called ciclopirox.  This is a fungal topical medication that may help.  I am also prescribing you some multivitamins to help your nutritional status which may help with nail growth.  I hope you have a wonderful afternoon.   Fungal Nail Infection A fungal nail infection is a common infection of the toenails or fingernails. This condition affects toenails more often than fingernails. It often affects the great, or big, toes. More than one nail may be infected. The condition can be passed from person to person (is contagious). What are the causes? This condition is caused by a fungus. Several types of fungi can cause the infection. These fungi are common in moist and warm areas. If your hands or feet come into contact with the fungus, it may get into a crack in your fingernail or toenail and cause the infection. What increases the risk? The following factors may make you more likely to develop this condition:  Being female.  Being of older age.  Living with someone who has the fungus.  Walking barefoot in areas where the fungus thrives, such as showers or locker rooms.  Wearing shoes and socks that cause your feet to sweat.  Having a nail injury or a recent nail surgery.  Having certain medical conditions, such as: ? Athlete's foot. ? Diabetes. ? Psoriasis. ? Poor circulation. ? A weak body defense system (immune system). What are the signs or symptoms? Symptoms of this condition include:  A pale spot on the nail.  Thickening of the nail.  A nail that becomes yellow or brown.  A brittle or ragged nail edge.  A crumbling nail.  A nail that has lifted away from the nail bed. How is this  diagnosed? This condition is diagnosed with a physical exam. Your health care provider may take a scraping or clipping from your nail to test for the fungus. How is this treated? Treatment is not needed for mild infections. If you have significant nail changes, treatment may include:  Antifungal medicines taken by mouth (orally). You may need to take the medicine for several weeks or several months, and you may not see the results for a long time. These medicines can cause side effects. Ask your health care provider what problems to watch for.  Antifungal nail polish or nail cream. These may be used along with oral antifungal medicines.  Laser treatment of the nail.  Surgery to remove the nail. This may be needed for the most severe infections. It can take a long time, usually up to a year, for the infection to go away. The infection may also come back. Follow these instructions at home: Medicines  Take or apply over-the-counter and prescription medicines only as told by your health care provider.  Ask your health care provider about using over-the-counter mentholated ointment on your nails. Nail care  Trim your nails often.  Wash and dry your hands and feet every day.  Keep your feet dry: ? Wear absorbent socks, and change your socks frequently. ? Wear shoes that allow air to circulate, such as sandals or canvas tennis shoes. Throw out old shoes.  Do not use artificial nails.  If you go to a nail salon, make sure you choose one that uses clean instruments.  Use antifungal foot powder on your feet and in your shoes. General instructions  Do not share personal items, such as towels or nail clippers.  Do not walk barefoot in shower rooms or locker rooms.  Wear rubber gloves if you are working with your hands in wet areas.  Keep all follow-up visits as told by your health care provider. This is important. Contact a health care provider if: Your infection is not getting better  or it is getting worse after several months. Summary  A fungal nail infection is a common infection of the toenails or fingernails.  Treatment is not needed for mild infections. If you have significant nail changes, treatment may include taking medicine orally and applying medicine to your nails.  It can take a long time, usually up to a year, for the infection to go away. The infection may also come back.  Take or apply over-the-counter and prescription medicines only as told by your health care provider.  Follow instructions for taking care of your nails to help prevent infection from coming back or spreading. This information is not intended to replace advice given to you by your health care provider. Make sure you discuss any questions you have with your health care provider. Document Revised: 01/20/2019 Document Reviewed: 03/05/2018 Elsevier Patient Education  [place].

## 2020-01-06 NOTE — Addendum Note (Signed)
Addended by: Andrena Mews T on: 01/06/2020 04:12 PM   Modules accepted: Orders

## 2020-01-06 NOTE — Assessment & Plan Note (Signed)
Patient here reporting onychomycosis of right great toe as well as left great toe.  This is been worsening over the last year on the right great toe and has now started on the left great toe.  Patient reports it is occasionally painful and itches. -Nail clipping sent for biopsy -Prescribed multivitamin along with cyclothyrox

## 2020-01-22 ENCOUNTER — Other Ambulatory Visit: Payer: Self-pay | Admitting: Family Medicine

## 2020-02-02 LAB — FUNGUS CULTURE W SMEAR

## 2020-02-10 DIAGNOSIS — E119 Type 2 diabetes mellitus without complications: Secondary | ICD-10-CM | POA: Diagnosis not present

## 2020-02-10 DIAGNOSIS — I10 Essential (primary) hypertension: Secondary | ICD-10-CM | POA: Diagnosis not present

## 2020-02-10 DIAGNOSIS — E785 Hyperlipidemia, unspecified: Secondary | ICD-10-CM | POA: Diagnosis not present

## 2020-02-28 DIAGNOSIS — I5030 Unspecified diastolic (congestive) heart failure: Secondary | ICD-10-CM | POA: Diagnosis not present

## 2020-02-28 DIAGNOSIS — E119 Type 2 diabetes mellitus without complications: Secondary | ICD-10-CM | POA: Diagnosis not present

## 2020-02-28 DIAGNOSIS — I2510 Atherosclerotic heart disease of native coronary artery without angina pectoris: Secondary | ICD-10-CM | POA: Diagnosis not present

## 2020-02-28 DIAGNOSIS — I10 Essential (primary) hypertension: Secondary | ICD-10-CM | POA: Diagnosis not present

## 2020-02-28 DIAGNOSIS — E785 Hyperlipidemia, unspecified: Secondary | ICD-10-CM | POA: Diagnosis not present

## 2020-04-17 ENCOUNTER — Other Ambulatory Visit: Payer: Self-pay | Admitting: Family Medicine

## 2020-04-17 ENCOUNTER — Encounter: Payer: Self-pay | Admitting: Family Medicine

## 2020-04-17 DIAGNOSIS — Z961 Presence of intraocular lens: Secondary | ICD-10-CM | POA: Diagnosis not present

## 2020-04-17 DIAGNOSIS — H524 Presbyopia: Secondary | ICD-10-CM | POA: Diagnosis not present

## 2020-04-17 DIAGNOSIS — E119 Type 2 diabetes mellitus without complications: Secondary | ICD-10-CM | POA: Diagnosis not present

## 2020-04-17 DIAGNOSIS — H35033 Hypertensive retinopathy, bilateral: Secondary | ICD-10-CM | POA: Diagnosis not present

## 2020-04-17 LAB — HM DIABETES EYE EXAM

## 2020-05-07 ENCOUNTER — Other Ambulatory Visit: Payer: Self-pay | Admitting: Hematology

## 2020-05-07 DIAGNOSIS — Z1231 Encounter for screening mammogram for malignant neoplasm of breast: Secondary | ICD-10-CM

## 2020-05-15 ENCOUNTER — Encounter: Payer: Self-pay | Admitting: Family Medicine

## 2020-05-15 ENCOUNTER — Other Ambulatory Visit: Payer: Self-pay

## 2020-05-15 ENCOUNTER — Ambulatory Visit (INDEPENDENT_AMBULATORY_CARE_PROVIDER_SITE_OTHER): Payer: Medicare Other | Admitting: Family Medicine

## 2020-05-15 VITALS — BP 112/70 | HR 60 | Ht 62.0 in | Wt 131.2 lb

## 2020-05-15 DIAGNOSIS — Z794 Long term (current) use of insulin: Secondary | ICD-10-CM | POA: Diagnosis not present

## 2020-05-15 DIAGNOSIS — E1165 Type 2 diabetes mellitus with hyperglycemia: Secondary | ICD-10-CM

## 2020-05-15 DIAGNOSIS — IMO0002 Reserved for concepts with insufficient information to code with codable children: Secondary | ICD-10-CM

## 2020-05-15 LAB — POCT GLYCOSYLATED HEMOGLOBIN (HGB A1C): HbA1c, POC (controlled diabetic range): 8.2 % — AB (ref 0.0–7.0)

## 2020-05-15 NOTE — Patient Instructions (Signed)
It was wonderful to see you today.  I am sorry your daughter is in the hospital but tell her I said hello.  Your hemoglobin A1c was 8.2 which was stable from your previous hemoglobin A1c.  I want you to continue your current diabetes medication regiment but I would like you to start taking your Jardiance.  If you notice any side effects please feel free to discontinue the medication and reach out.  If you have any questions or concerns you may also call our clinic and you can schedule appointment.  I will see you in 3 months for another diabetes checkup.  I hope you have a wonderful afternoon!

## 2020-05-15 NOTE — Progress Notes (Signed)
° ° °  SUBJECTIVE:   CHIEF COMPLAINT / HPI:   Diabetes checkup Patient reports good compliance with her diabetes medications Metformin and insulin.  Reports that she still has not taken Jardiance because she is concerned about side effects.  We discussed what side effects she is concerned about and she is mainly concerned regarding her kidney function.  We discussed the mechanism of how Jardiance works and patient says that she is willing to try it if she notices any adverse reaction she is going to discontinue.  Patient reports that her blood sugars are generally in the 100s to low 200s.  Denies any low blood sugars.  Denies any signs or symptoms of hypo or hyperglycemia.  Hemoglobin A1c at last check was 8.2.    OBJECTIVE:   BP 112/70    Pulse 60    Ht 5\' 2"  (1.575 m)    Wt 131 lb 3.2 oz (59.5 kg)    SpO2 95%    BMI 24.00 kg/m   General: Well-appearing, no acute distress, resting comfortably in chair next to exam table Cardiac: Regular rate and rhythm, no murmurs appreciated Respiratory: Normal work of breathing, lungs clear to auscultation bilaterally Abdomen: Soft, nontender, positive bowel sounds Extremities: No edema noted, no bruising  ASSESSMENT/PLAN:   Insulin dependent type 2 diabetes mellitus, uncontrolled (HCC) Hemoglobin A1c today is stable at 8.2 from 8.2 at the previous check.  It was 6.7 prior to that.  She has good compliance with insulin and Metformin.  She is still not started the Jardiance but says that she is is willing to try this. -Continue Lantus 24 units daily and Humalog -Continue Metformin 500 mg XR twice daily -Follow-up diabetes check in 3 months -Patient is going to try Jardiance     Gifford Shave, MD [place]

## 2020-05-15 NOTE — Progress Notes (Deleted)
    SUBJECTIVE:   CHIEF COMPLAINT / HPI:   ***  PERTINENT  PMH / PSH: ***  OBJECTIVE:   BP 112/70   Pulse 60   Ht 5\' 2"  (1.575 m)   Wt 131 lb 3.2 oz (59.5 kg)   SpO2 95%   BMI 24.00 kg/m   ***  ASSESSMENT/PLAN:   No problem-specific Assessment & Plan notes found for this encounter.     Gifford Shave, MD [place]

## 2020-05-17 NOTE — Assessment & Plan Note (Signed)
Hemoglobin A1c today is stable at 8.2 from 8.2 at the previous check.  It was 6.7 prior to that.  She has good compliance with insulin and Metformin.  She is still not started the Jardiance but says that she is is willing to try this. -Continue Lantus 24 units daily and Humalog -Continue Metformin 500 mg XR twice daily -Follow-up diabetes check in 3 months -Patient is going to try [place]

## 2020-05-29 DIAGNOSIS — I10 Essential (primary) hypertension: Secondary | ICD-10-CM | POA: Diagnosis not present

## 2020-05-29 DIAGNOSIS — E785 Hyperlipidemia, unspecified: Secondary | ICD-10-CM | POA: Diagnosis not present

## 2020-05-29 DIAGNOSIS — I2510 Atherosclerotic heart disease of native coronary artery without angina pectoris: Secondary | ICD-10-CM | POA: Diagnosis not present

## 2020-05-29 DIAGNOSIS — I5030 Unspecified diastolic (congestive) heart failure: Secondary | ICD-10-CM | POA: Diagnosis not present

## 2020-05-29 DIAGNOSIS — E119 Type 2 diabetes mellitus without complications: Secondary | ICD-10-CM | POA: Diagnosis not present

## 2020-06-05 ENCOUNTER — Other Ambulatory Visit: Payer: Self-pay | Admitting: Family Medicine

## 2020-06-05 DIAGNOSIS — E785 Hyperlipidemia, unspecified: Secondary | ICD-10-CM

## 2020-06-05 DIAGNOSIS — I10 Essential (primary) hypertension: Secondary | ICD-10-CM

## 2020-06-08 ENCOUNTER — Ambulatory Visit
Admission: RE | Admit: 2020-06-08 | Discharge: 2020-06-08 | Disposition: A | Discharge status: Home / Self Care | Payer: Medicare Other | Source: Ambulatory Visit | Attending: Hematology | Admitting: Hematology

## 2020-06-08 ENCOUNTER — Other Ambulatory Visit: Payer: Self-pay

## 2020-06-08 DIAGNOSIS — Z1231 Encounter for screening mammogram for malignant neoplasm of breast: Secondary | ICD-10-CM

## 2020-07-09 ENCOUNTER — Other Ambulatory Visit: Payer: Self-pay | Admitting: Family Medicine

## 2020-07-13 ENCOUNTER — Other Ambulatory Visit: Payer: Self-pay | Admitting: Family Medicine

## 2020-07-13 DIAGNOSIS — I10 Essential (primary) hypertension: Secondary | ICD-10-CM

## 2020-08-14 ENCOUNTER — Encounter: Payer: Self-pay | Admitting: Family Medicine

## 2020-08-14 ENCOUNTER — Ambulatory Visit (INDEPENDENT_AMBULATORY_CARE_PROVIDER_SITE_OTHER): Payer: Medicare Other | Admitting: Family Medicine

## 2020-08-14 ENCOUNTER — Other Ambulatory Visit: Payer: Self-pay

## 2020-08-14 VITALS — BP 128/62 | HR 55 | Ht 62.0 in | Wt 131.2 lb

## 2020-08-14 DIAGNOSIS — I10 Essential (primary) hypertension: Secondary | ICD-10-CM | POA: Diagnosis not present

## 2020-08-14 DIAGNOSIS — Z794 Long term (current) use of insulin: Secondary | ICD-10-CM | POA: Diagnosis not present

## 2020-08-14 DIAGNOSIS — IMO0002 Reserved for concepts with insufficient information to code with codable children: Secondary | ICD-10-CM

## 2020-08-14 DIAGNOSIS — E1165 Type 2 diabetes mellitus with hyperglycemia: Secondary | ICD-10-CM

## 2020-08-14 LAB — POCT GLYCOSYLATED HEMOGLOBIN (HGB A1C): Hemoglobin A1C: 7.2 % — AB (ref 4.0–5.6)

## 2020-08-14 NOTE — Progress Notes (Signed)
    SUBJECTIVE:   CHIEF COMPLAINT / HPI:   Diabetes checkup Patient's last hemoglobin A1c was 8.2.  At her last visit we discussed continuing her current Metformin and insulin but starting Jardiance.  Patient was concerned about the side effects were reported that she would start the [place].  Patient has been taking her Jardiance as prescribed since being seen at her previous visit and reports that her blood sugars have been well controlled.  Denies any low blood sugars or signs or symptoms of hypoglycemia.   OBJECTIVE:   BP 128/62   Pulse (!) 55   Ht 5\' 2"  (1.575 m)   Wt 131 lb 3.2 oz (59.5 kg)   SpO2 95%   BMI 24.00 kg/m   General: Well-appearing, no acute distress, sitting comfortably on exam table Cardiac: Regular rate and rhythm, no murmurs appreciated Respiratory: Normal work of breathing, lungs clear to auscultation bilaterally Abdomen: Soft, nontender, positive bowel sounds Extremities: See diabetic foot exam below   Diabetic foot exam was performed with the following findings:   No deformities, ulcerations, or other skin breakdown Normal sensation of 10g monofilament Intact posterior tibialis and dorsalis pedis pulses    ASSESSMENT/PLAN:   Insulin dependent type 2 diabetes mellitus, uncontrolled (HCC) Hemoglobin A1c 7.2 from 8.2 at previous visit.  Reports good compliance with her Metformin and Jardiance, insulin.  Denies any signs or symptoms of hypoglycemia. -Continue Lantus 24 units daily and Humalog -Continue Metformin 500 mg XR twice daily -Continue Jardiance as prescribed -Follow-up for next diabetes check in 3 months -BMP today to assess renal function given recently starting [place], MD [place]

## 2020-08-14 NOTE — Assessment & Plan Note (Addendum)
Hemoglobin A1c 7.2 from 8.2 at previous visit.  Reports good compliance with her Metformin and Jardiance, insulin.  Denies any signs or symptoms of hypoglycemia. -Continue Lantus 24 units daily and Humalog -Continue Metformin 500 mg XR twice daily -Continue Jardiance as prescribed -Follow-up for next diabetes check in 3 months -BMP today to assess renal function given recently starting Jardiance

## 2020-08-14 NOTE — Patient Instructions (Signed)
It was wonderful to see you today.  Regarding your leg pain it is most likely related to arthritis as well as deconditioning so I recommend stretching before you go for walks.  If it continues to bother you we can get an x-ray of your hip.  Regarding your sleep issues I recommend you take over-the-counter melatonin and you can ask your pharmacist where to find this.  Your diabetes is doing wonderfully and your hemoglobin A1c was 7.2 today.  We are going to continue on your current regimen.  I am going to collect some lab work today to assess your kidney function now that you are taking the [place] consistently.  I will call you with the results if there is anything abnormal and if not I will send you a letter.  I would like to see you again in 3 months.  I hope you have a wonderful afternoon!

## 2020-08-15 ENCOUNTER — Encounter: Payer: Self-pay | Admitting: Family Medicine

## 2020-08-15 LAB — BASIC METABOLIC PANEL
BUN/Creatinine Ratio: 11 — ABNORMAL LOW (ref 12–28)
BUN: 10 mg/dL (ref 8–27)
CO2: 24 mmol/L (ref 20–29)
Calcium: 9.6 mg/dL (ref 8.7–10.3)
Chloride: 103 mmol/L (ref 96–106)
Creatinine, Ser: 0.89 mg/dL (ref 0.57–1.00)
GFR calc Af Amer: 72 mL/min/{1.73_m2} (ref >59)
GFR calc non Af Amer: 62 mL/min/{1.73_m2} (ref >59)
Glucose: 129 mg/dL — ABNORMAL HIGH (ref 65–99)
Potassium: 4.5 mmol/L (ref 3.5–5.2)
Sodium: 141 mmol/L (ref 134–144)

## 2020-08-15 NOTE — Progress Notes (Signed)
Patient's lab results are within normal ranges.  No concerns at this time.

## 2020-09-11 DIAGNOSIS — E785 Hyperlipidemia, unspecified: Secondary | ICD-10-CM | POA: Diagnosis not present

## 2020-09-11 DIAGNOSIS — I10 Essential (primary) hypertension: Secondary | ICD-10-CM | POA: Diagnosis not present

## 2020-09-11 DIAGNOSIS — I2510 Atherosclerotic heart disease of native coronary artery without angina pectoris: Secondary | ICD-10-CM | POA: Diagnosis not present

## 2020-09-11 DIAGNOSIS — E119 Type 2 diabetes mellitus without complications: Secondary | ICD-10-CM | POA: Diagnosis not present

## 2020-09-11 DIAGNOSIS — I5030 Unspecified diastolic (congestive) heart failure: Secondary | ICD-10-CM | POA: Diagnosis not present

## 2020-09-25 ENCOUNTER — Other Ambulatory Visit: Payer: Self-pay | Admitting: Family Medicine

## 2020-09-25 DIAGNOSIS — I10 Essential (primary) hypertension: Secondary | ICD-10-CM

## 2020-10-03 ENCOUNTER — Other Ambulatory Visit: Payer: Self-pay | Admitting: Family Medicine

## 2020-12-05 ENCOUNTER — Other Ambulatory Visit: Payer: Self-pay | Admitting: Family Medicine

## 2020-12-05 DIAGNOSIS — IMO0002 Reserved for concepts with insufficient information to code with codable children: Secondary | ICD-10-CM

## 2020-12-05 DIAGNOSIS — E1165 Type 2 diabetes mellitus with hyperglycemia: Secondary | ICD-10-CM

## 2020-12-11 DIAGNOSIS — E559 Vitamin D deficiency, unspecified: Secondary | ICD-10-CM | POA: Diagnosis not present

## 2020-12-11 DIAGNOSIS — I10 Essential (primary) hypertension: Secondary | ICD-10-CM | POA: Diagnosis not present

## 2020-12-11 DIAGNOSIS — I5030 Unspecified diastolic (congestive) heart failure: Secondary | ICD-10-CM | POA: Diagnosis not present

## 2020-12-11 DIAGNOSIS — I2510 Atherosclerotic heart disease of native coronary artery without angina pectoris: Secondary | ICD-10-CM | POA: Diagnosis not present

## 2020-12-11 DIAGNOSIS — R0789 Other chest pain: Secondary | ICD-10-CM | POA: Diagnosis not present

## 2020-12-11 DIAGNOSIS — E119 Type 2 diabetes mellitus without complications: Secondary | ICD-10-CM | POA: Diagnosis not present

## 2020-12-11 DIAGNOSIS — E785 Hyperlipidemia, unspecified: Secondary | ICD-10-CM | POA: Diagnosis not present

## 2020-12-18 ENCOUNTER — Ambulatory Visit (INDEPENDENT_AMBULATORY_CARE_PROVIDER_SITE_OTHER): Payer: Medicare Other | Admitting: Family Medicine

## 2020-12-18 ENCOUNTER — Other Ambulatory Visit: Payer: Self-pay

## 2020-12-18 VITALS — BP 140/72 | HR 57 | Wt 131.2 lb

## 2020-12-18 DIAGNOSIS — K219 Gastro-esophageal reflux disease without esophagitis: Secondary | ICD-10-CM | POA: Diagnosis not present

## 2020-12-18 DIAGNOSIS — IMO0002 Reserved for concepts with insufficient information to code with codable children: Secondary | ICD-10-CM

## 2020-12-18 DIAGNOSIS — E1165 Type 2 diabetes mellitus with hyperglycemia: Secondary | ICD-10-CM

## 2020-12-18 DIAGNOSIS — Z794 Long term (current) use of insulin: Secondary | ICD-10-CM | POA: Diagnosis not present

## 2020-12-18 LAB — POCT GLYCOSYLATED HEMOGLOBIN (HGB A1C): Hemoglobin A1C: 6.8 % — AB (ref 4.0–5.6)

## 2020-12-18 NOTE — Progress Notes (Signed)
    SUBJECTIVE:   CHIEF COMPLAINT / HPI:   Diabetes check up  Patient reports he feels her sugars are doing well and she has been compliant with her medications.  Her lowest blood sugar in the last month has been 79.  She denies any extremely low blood sugars over the last 3 months.  Last hemoglobin A1c was 7.2.  GERD  Patient reports that she has been having some mid epigastric pain for the last few weeks.  She also reports shortness of breath on exertion.  She saw her cardiologist last week and brought this up.  He completed an EKG and told her that her heart looks fine and wanted to try some omeprazole 40 mg for reflux.  She reports that if the medication does not work he wants to get an echocardiogram.  She feels like since starting the omeprazole the pain has improved.  We discussed that this may be GERD but her symptoms also sound consistent with cardiac issues.  Denies any pain at this time.   OBJECTIVE:   BP 140/72   Pulse (!) 57   Wt 131 lb 3.2 oz (59.5 kg)   SpO2 97%   BMI 24.00 kg/m   General: Well-appearing 80 year old female, no acute distress Cardiac: Regular rate and rhythm, no murmurs appreciated Respiratory: Normal work of breathing, lungs are clear to auscultation bilaterally with no crackles heard Abdomen: Soft, nontender, positive bowel sounds MSK: No lower extremity edema noted at this time, ambulates without difficulty.  ASSESSMENT/PLAN:   Insulin dependent type 2 diabetes mellitus, uncontrolled (HCC) Hemoglobin A1c 6.8 from 7.2 at previous check.  Reports good compliance with Metformin and Jardiance as well as insulin.  Denies any hypoglycemia or signs or symptoms of hypoglycemia. -Continue Lantus 24 units daily and Humalog -Continue Metformin 500 mg XR twice daily -Continue Jardiance as prescribed -Follow-up diabetes checkup in 3 months   GERD (gastroesophageal reflux disease) Patient reports a burning epigastric pain over the last few months consistent  with GERD.  She reports that she spoke to her cardiologist about this and also noted shortness of breath especially with exertion like walking up stairs or walking a block.  We discussed that this could be GERD but there are also cardiac concerns.  She was told by her cardiologist that if the symptoms did not improve she should call and let him know and he may get further imaging of her heart. -Continue omeprazole 40 mg daily -Strict ED and return precautions given -Patient is to follow-up with cardiology if symptoms do not improve but should also let me know as well.     Gifford Shave, MD [place]

## 2020-12-18 NOTE — Patient Instructions (Signed)
It was great seeing you today.  Your diabetes is doing wonderfully and I want to continue on your current regimen.  Regarding your hip weakness I want to try hip strengthening exercises.  Below is some hip strengthening exercise instructions.  If you have any worsening chest pain, shortness of breath, nausea please seek medical attention immediately.  I hope you have a wonderful afternoon!   Hip Exercises Ask your health care provider which exercises are safe for you. Do exercises exactly as told by your health care provider and adjust them as directed. It is normal to feel mild stretching, pulling, tightness, or discomfort as you do these exercises. Stop right away if you feel sudden pain or your pain gets worse. Do not begin these exercises until told by your health care provider. Stretching and range-of-motion exercises These exercises warm up your muscles and joints and improve the movement and flexibility of your hip. These exercises also help to relieve pain, numbness, and tingling. You may be asked to limit your range of motion if you had a hip replacement. Talk to your health care provider about these restrictions. Hamstrings, supine 1. Lie on your back (supine position). 2. Loop a belt or towel over the ball of your left / right foot. The ball of your foot is on the walking surface, right under your toes. 3. Straighten your left / right knee and slowly pull on the belt or towel to raise your leg until you feel a gentle stretch behind your knee (hamstring). ? Do not let your knee bend while you do this. ? Keep your other leg flat on the floor. 4. Hold this position for __________ seconds. 5. Slowly return your leg to the starting position. Repeat __________ times. Complete this exercise __________ times a day.   Hip rotation 1. Lie on your back on a firm surface. 2. With your left / right hand, gently pull your left / right knee toward the shoulder that is on the same side of the body. Stop  when your knee is pointing toward the ceiling. 3. Hold your left / right ankle with your other hand. 4. Keeping your knee steady, gently pull your left / right ankle toward your other shoulder until you feel a stretch in your buttocks. ? Keep your hips and shoulders firmly planted while you do this stretch. 5. Hold this position for __________ seconds. Repeat __________ times. Complete this exercise __________ times a day.   Seated stretch This exercise is sometimes called hamstrings and adductors stretch. 1. Sit on the floor with your legs stretched wide. Keep your knees straight during this exercise. 2. Keeping your head and back in a straight line, bend at your waist to reach for your left foot (position A). You should feel a stretch in your right inner thigh (adductors). 3. Hold this position for __________ seconds. Then slowly return to the upright position. 4. Keeping your head and back in a straight line, bend at your waist to reach forward (position B). You should feel a stretch behind both of your thighs and knees (hamstrings). 5. Hold this position for __________ seconds. Then slowly return to the upright position. 6. Keeping your head and back in a straight line, bend at your waist to reach for your right foot (position C). You should feel a stretch in your left inner thigh (adductors). 7. Hold this position for __________ seconds. Then slowly return to the upright position. Repeat __________ times. Complete this exercise __________ times a day.  Lunge This exercise stretches the muscles of the hip (hip flexors). 1. Place your left / right knee on the floor and bend your other knee so that is directly over your ankle. You should be half-kneeling. 2. Keep good posture with your head over your shoulders. 3. Tighten your buttocks to point your tailbone downward. This will prevent your back from arching too much. 4. You should feel a gentle stretch in the front of your left / right thigh  and hip. If you do not feel a stretch, slide your other foot forward slightly and then slowly lunge forward with your chest up until your knee once again lines up over your ankle. ? Make sure your tailbone continues to point downward. 5. Hold this position for __________ seconds. 6. Slowly return to the starting position. Repeat __________ times. Complete this exercise __________ times a day.   Strengthening exercises These exercises build strength and endurance in your hip. Endurance is the ability to use your muscles for a long time, even after they get tired. Bridge This exercise strengthens the muscles of your hip (hip extensors). 1. Lie on your back on a firm surface with your knees bent and your feet flat on the floor. 2. Tighten your buttocks muscles and lift your bottom off the floor until the trunk of your body and your hips are level with your thighs. ? Do not arch your back. ? You should feel the muscles working in your buttocks and the back of your thighs. If you do not feel these muscles, slide your feet 1-2 inches (2.5-5 cm) farther away from your buttocks. 3. Hold this position for __________ seconds. 4. Slowly lower your hips to the starting position. 5. Let your muscles relax completely between repetitions. Repeat __________ times. Complete this exercise __________ times a day.   Straight leg raises, side-lying This exercise strengthens the muscles that move the hip joint away from the center of the body (hip abductors). 1. Lie on your side with your left / right leg in the top position. Lie so your head, shoulder, hip, and knee line up. You may bend your bottom knee slightly to help you balance. 2. Roll your hips slightly forward, so your hips are stacked directly over each other and your left / right knee is facing forward. 3. Leading with your heel, lift your top leg 4-6 inches (10-15 cm). You should feel the muscles in your top hip lifting. ? Do not let your foot drift  forward. ? Do not let your knee roll toward the ceiling. 4. Hold this position for __________ seconds. 5. Slowly return to the starting position. 6. Let your muscles relax completely between repetitions. Repeat __________ times. Complete this exercise __________ times a day.   Straight leg raises, side-lying This exercise strengthens the muscles that move the hip joint toward the center of the body (hip adductors). 1. Lie on your side with your left / right leg in the bottom position. Lie so your head, shoulder, hip, and knee line up. You may place your upper foot in front to help you balance. 2. Roll your hips slightly forward, so your hips are stacked directly over each other and your left / right knee is facing forward. 3. Tense the muscles in your inner thigh and lift your bottom leg 4-6 inches (10-15 cm). 4. Hold this position for __________ seconds. 5. Slowly return to the starting position. 6. Let your muscles relax completely between repetitions. Repeat __________ times. Complete this exercise  __________ times a day.   Straight leg raises, supine This exercise strengthens the muscles in the front of your thigh (quadriceps). 1. Lie on your back (supine position) with your left / right leg extended and your other knee bent. 2. Tense the muscles in the front of your left / right thigh. You should see your kneecap slide up or see increased dimpling just above your knee. 3. Keep these muscles tight as you raise your leg 4-6 inches (10-15 cm) off the floor. Do not let your knee bend. 4. Hold this position for __________ seconds. 5. Keep these muscles tense as you lower your leg. 6. Relax the muscles slowly and completely between repetitions. Repeat __________ times. Complete this exercise __________ times a day.   Hip abductors, standing This exercise strengthens the muscles that move the leg and hip joint away from the center of the body (hip abductors). 1. Tie one end of a rubber  exercise band or tubing to a secure surface, such as a chair, table, or pole. 2. Loop the other end of the band or tubing around your left / right ankle. 3. Keeping your ankle with the band or tubing directly opposite the secured end, step away until there is tension in the tubing or band. Hold on to a chair, table, or pole as needed for balance. 4. Lift your left / right leg out to your side. While you do this: ? Keep your back upright. ? Keep your shoulders over your hips. ? Keep your toes pointing forward. ? Make sure to use your hip muscles to slowly lift your leg. Do not tip your body or forcefully lift your leg. 5. Hold this position for __________ seconds. 6. Slowly return to the starting position. Repeat __________ times. Complete this exercise __________ times a day. Squats This exercise strengthens the muscles in the front of your thigh (quadriceps). 1. Stand in a door frame so your feet and knees are in line with the frame. You may place your hands on the frame for balance. 2. Slowly bend your knees and lower your hips like you are going to sit in a chair. ? Keep your lower legs in a straight-up-and-down position. ? Do not let your hips go lower than your knees. ? Do not bend your knees lower than told by your health care provider. ? If your hip pain increases, do not bend as low. 3. Hold this position for ___________ seconds. 4. Slowly push with your legs to return to standing. Do not use your hands to pull yourself to standing. Repeat __________ times. Complete this exercise __________ times a day. This information is not intended to replace advice given to you by your health care provider. Make sure you discuss any questions you have with your health care provider. Document Revised: 05/05/2019 Document Reviewed: 08/10/2018 Elsevier Patient Education  2021 [employer].

## 2020-12-19 DIAGNOSIS — K219 Gastro-esophageal reflux disease without esophagitis: Secondary | ICD-10-CM | POA: Insufficient documentation

## 2020-12-19 NOTE — Assessment & Plan Note (Signed)
Hemoglobin A1c 6.8 from 7.2 at previous check.  Reports good compliance with Metformin and Jardiance as well as insulin.  Denies any hypoglycemia or signs or symptoms of hypoglycemia. -Continue Lantus 24 units daily and Humalog -Continue Metformin 500 mg XR twice daily -Continue Jardiance as prescribed -Follow-up diabetes checkup in 3 months

## 2020-12-19 NOTE — Assessment & Plan Note (Signed)
Patient reports a burning epigastric pain over the last few months consistent with GERD.  She reports that she spoke to her cardiologist about this and also noted shortness of breath especially with exertion like walking up stairs or walking a block.  We discussed that this could be GERD but there are also cardiac concerns.  She was told by her cardiologist that if the symptoms did not improve she should call and let him know and he may get further imaging of her heart. -Continue omeprazole 40 mg daily -Strict ED and return precautions given -Patient is to follow-up with cardiology if symptoms do not improve but should also let me know as well.

## 2020-12-25 ENCOUNTER — Other Ambulatory Visit: Payer: Self-pay | Admitting: Family Medicine

## 2020-12-31 ENCOUNTER — Other Ambulatory Visit: Payer: Self-pay | Admitting: Family Medicine

## 2020-12-31 DIAGNOSIS — E1165 Type 2 diabetes mellitus with hyperglycemia: Secondary | ICD-10-CM

## 2020-12-31 DIAGNOSIS — IMO0002 Reserved for concepts with insufficient information to code with codable children: Secondary | ICD-10-CM

## 2021-01-21 ENCOUNTER — Other Ambulatory Visit: Payer: Self-pay

## 2021-01-21 ENCOUNTER — Emergency Department (HOSPITAL_COMMUNITY): Payer: Medicare Other

## 2021-01-21 ENCOUNTER — Emergency Department (HOSPITAL_COMMUNITY)
Admission: EM | Admit: 2021-01-21 | Discharge: 2021-01-21 | Disposition: A | Discharge status: Home / Self Care | Payer: Medicare Other | Attending: Emergency Medicine | Admitting: Emergency Medicine

## 2021-01-21 DIAGNOSIS — N281 Cyst of kidney, acquired: Secondary | ICD-10-CM | POA: Diagnosis not present

## 2021-01-21 DIAGNOSIS — K8020 Calculus of gallbladder without cholecystitis without obstruction: Secondary | ICD-10-CM

## 2021-01-21 DIAGNOSIS — K8070 Calculus of gallbladder and bile duct without cholecystitis without obstruction: Secondary | ICD-10-CM | POA: Diagnosis not present

## 2021-01-21 DIAGNOSIS — I10 Essential (primary) hypertension: Secondary | ICD-10-CM | POA: Diagnosis not present

## 2021-01-21 DIAGNOSIS — E119 Type 2 diabetes mellitus without complications: Secondary | ICD-10-CM | POA: Insufficient documentation

## 2021-01-21 DIAGNOSIS — Z853 Personal history of malignant neoplasm of breast: Secondary | ICD-10-CM | POA: Diagnosis not present

## 2021-01-21 DIAGNOSIS — R1010 Upper abdominal pain, unspecified: Secondary | ICD-10-CM | POA: Insufficient documentation

## 2021-01-21 DIAGNOSIS — Z7984 Long term (current) use of oral hypoglycemic drugs: Secondary | ICD-10-CM | POA: Diagnosis not present

## 2021-01-21 DIAGNOSIS — Z8541 Personal history of malignant neoplasm of cervix uteri: Secondary | ICD-10-CM | POA: Diagnosis not present

## 2021-01-21 DIAGNOSIS — Z7982 Long term (current) use of aspirin: Secondary | ICD-10-CM | POA: Insufficient documentation

## 2021-01-21 DIAGNOSIS — I4510 Unspecified right bundle-branch block: Secondary | ICD-10-CM | POA: Diagnosis not present

## 2021-01-21 DIAGNOSIS — Z794 Long term (current) use of insulin: Secondary | ICD-10-CM | POA: Diagnosis not present

## 2021-01-21 DIAGNOSIS — Z79899 Other long term (current) drug therapy: Secondary | ICD-10-CM | POA: Diagnosis not present

## 2021-01-21 DIAGNOSIS — R109 Unspecified abdominal pain: Secondary | ICD-10-CM

## 2021-01-21 DIAGNOSIS — Z9011 Acquired absence of right breast and nipple: Secondary | ICD-10-CM | POA: Insufficient documentation

## 2021-01-21 DIAGNOSIS — R079 Chest pain, unspecified: Secondary | ICD-10-CM | POA: Insufficient documentation

## 2021-01-21 DIAGNOSIS — R1013 Epigastric pain: Secondary | ICD-10-CM | POA: Diagnosis not present

## 2021-01-21 LAB — CBG MONITORING, ED
Glucose-Capillary: 58 mg/dL — ABNORMAL LOW (ref 70–99)
Glucose-Capillary: 109 mg/dL — ABNORMAL HIGH (ref 70–99)

## 2021-01-21 LAB — CBC
HCT: 40.8 % (ref 36.0–46.0)
Hemoglobin: 12.8 g/dL (ref 12.0–15.0)
MCH: 29.7 pg (ref 26.0–34.0)
MCHC: 31.4 g/dL (ref 30.0–36.0)
MCV: 94.7 fL (ref 80.0–100.0)
Platelets: 192 10*3/uL (ref 150–400)
RBC: 4.31 MIL/uL (ref 3.87–5.11)
RDW: 14.5 % (ref 11.5–15.5)
WBC: 7.9 10*3/uL (ref 4.0–10.5)
nRBC: 0 % (ref 0.0–0.2)

## 2021-01-21 LAB — URINALYSIS, ROUTINE W REFLEX MICROSCOPIC
Bacteria, UA: NONE SEEN
Bilirubin Urine: NEGATIVE
Glucose, UA: >=500 mg/dL — AB
Hgb urine dipstick: NEGATIVE
Ketones, ur: NEGATIVE mg/dL
Leukocytes,Ua: NEGATIVE
Nitrite: NEGATIVE
Protein, ur: NEGATIVE mg/dL
Specific Gravity, Urine: 1.026 (ref 1.005–1.030)
pH: 5 (ref 5.0–8.0)

## 2021-01-21 LAB — COMPREHENSIVE METABOLIC PANEL
ALT: 18 U/L (ref 0–44)
AST: 25 U/L (ref 15–41)
Albumin: 3.7 g/dL (ref 3.5–5.0)
Alkaline Phosphatase: 61 U/L (ref 38–126)
Anion gap: 8 (ref 5–15)
BUN: 8 mg/dL (ref 8–23)
CO2: 28 mmol/L (ref 22–32)
Calcium: 9.5 mg/dL (ref 8.9–10.3)
Chloride: 104 mmol/L (ref 98–111)
Creatinine, Ser: 1 mg/dL (ref 0.44–1.00)
GFR, Estimated: 58 mL/min — ABNORMAL LOW (ref >60)
Glucose, Bld: 107 mg/dL — ABNORMAL HIGH (ref 70–99)
Potassium: 4.2 mmol/L (ref 3.5–5.1)
Sodium: 140 mmol/L (ref 135–145)
Total Bilirubin: 0.8 mg/dL (ref 0.3–1.2)
Total Protein: 7.7 g/dL (ref 6.5–8.1)

## 2021-01-21 LAB — TROPONIN I (HIGH SENSITIVITY): Troponin I (High Sensitivity): 6 ng/L (ref <18)

## 2021-01-21 LAB — LIPASE, BLOOD: Lipase: 34 U/L (ref 11–51)

## 2021-01-21 NOTE — ED Triage Notes (Signed)
C/O upper abdominal pain, discomfort and gas x 2-3 days. Worst after eating or drinking.

## 2021-01-21 NOTE — ED Provider Notes (Signed)
[facility] EMERGENCY DEPARTMENT Provider Note   CSN: [id number] Arrival date & time: 01/21/21  1109     History Chief Complaint  Patient presents with  . Abdominal Pain    Dana Coleman is a 80 y.o. female.  HPI Patient presents with upper abdominal pain.  Sometimes goes from the chest.  States it comes on after eating particularly.  Also states that she has gas.  States that has been dealing with for few weeks now but worse last few days.  Had been on antacid medicine that had helped the symptoms.  Pain is not exertional.  No fever.  No cough.  Improves when she has not been eating.  No diarrhea or constipation.  Has had some previous heart attacks but states it felt different.  Has seen PCP for the same.    Past Medical History:  Diagnosis Date  . Allergy    occ uses OTC allergy meds   . Arthritis    fingers  . Breast cancer ([place]) 04/2008   Right breast  . Cataracts, bilateral    removed bilat   . Cervical cancer ([place])    When the patient was in her 80s  . Depression   . Diabetes mellitus without complication ([place])   . Heart attack ([place]) 2004  . 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 Hyperlipidemia   . Hypertension   . Personal history of radiation therapy 2009    Patient Active Problem List   Diagnosis Date Noted  . GERD (gastroesophageal reflux disease) 12/19/2020  . Neck muscle spasm 02/16/2019  . Breast lump 12/07/2018  . Plantar fasciitis, right 09/30/2018  . Plantar wart 11/17/2017  . Insulin dependent type 2 diabetes mellitus, uncontrolled ([place]) 08/14/2014  . History of thyroid nodule 01/16/2011  . History of breast cancer (right)  stage 1 ERA positive   . History of cervical cancer   . Hyperlipidemia   . Hypertension   . CAD (coronary artery disease), native coronary artery   . Osteopenia 12/10/2006    Past Surgical History:  Procedure Laterality Date  . BREAST LUMPECTOMY Right 2009  . BREAST SURGERY  2009   right lumpectomy  .  CERVIX SURGERY  1980  . COLONOSCOPY    . LEFT HEART CATHETERIZATION WITH CORONARY ANGIOGRAM N/A 04/20/2014   Procedure: LEFT HEART CATHETERIZATION WITH CORONARY ANGIOGRAM;  Surgeon: Jacolyn Reedy, MD;  Location: [facility] CATH LAB;  Service: Cardiovascular;  Laterality: N/A;  . POLYPECTOMY    . thyroid     for nodule hemithyroidectomy, benign  . THYROIDECTOMY Right 03/2003     OB History   No obstetric history on file.     Family History  Problem Relation Age of Onset  . Heart disease Mother        age 64  . Heart disease Father   . Cancer Father        unknown  . Heart disease Brother   . Hypertension Brother   . Diabetes Daughter   . Stroke Daughter   . Cirrhosis Brother   . Colon cancer Neg Hx   . Colon polyps Neg Hx   . Esophageal cancer Neg Hx   . Stomach cancer Neg Hx   . Rectal cancer Neg Hx     Social History   Tobacco Use  . Smoking status: Never Smoker  . Smokeless tobacco: Never Used  . Tobacco comment: No plans to start  Vaping Use  . Vaping Use: Never used  Substance Use Topics  . Alcohol use: No  . Drug use: No    Home Medications Prior to Admission medications   Medication Sig Start Date End Date Taking? Authorizing Provider  amLODipine (NORVASC) 10 MG tablet TAKE 1 TABLET BY MOUTH EVERY DAY 07/20/20   Gifford Shave, MD  aspirin 81 MG chewable tablet Chew 1 tablet (81 mg total) by mouth daily. 06/16/13   Patrecia Pour, MD  Blood Glucose Monitoring Suppl MISC One touch ultra glucose monitor. Check blood sugar twice a day.    [provider]  Calcium Citrate 250 MG TABS Take 1 tablet by mouth daily.    [provider]  cholecalciferol 2000 UNITS tablet Take 2,000 Units by mouth daily.  01/16/11   Katherina Mires, MD  ciclopirox (PENLAC) 8 % solution Apply topically at bedtime. Apply over nail and surrounding skin. Apply daily over previous coat. After seven (7) days, may remove with alcohol and continue cycle. 01/06/20   Kinnie Feil, MD   FLUAD 0.5 ML SUSY TO BE ADMINISTERED BY PHARMACIST FOR IMMUNIZATION 06/09/18   [provider]  glucose blood (ONE TOUCH ULTRA TEST) test strip CHECK BLOOD SUGAR TWICE DAILY 09/24/18   Everrett Coombe, MD  HUMALOG KWIKPEN 100 UNIT/ML KiwkPen INJECT 6 UNITS INTO THE SKIN DAILY BEFORE SUPPER 07/17/17   Everrett Coombe, MD  hydrochlorothiazide (HYDRODIURIL) 12.5 MG tablet TAKE 1 TABLET BY MOUTH EVERY DAY 09/25/20   Gifford Shave, MD  Insulin Pen Needle (B-D UF III MINI PEN NEEDLES) 31G X 5 MM MISC INJECT INSULIN VIA PEN 6 TIMES DAILY 02/22/18   Everrett Coombe, MD  JARDIANCE 10 MG TABS tablet TAKE 1 TABLET BY MOUTH EVERY DAY 12/05/20   Gifford Shave, MD  LANTUS SOLOSTAR 100 UNIT/ML Solostar Pen INJECT 24 UNITS INTO THE SKIN EVERY MORNING. 12/31/20   Gifford Shave, MD  losartan (COZAAR) 50 MG tablet Take 50 mg by mouth daily. 05/24/19   [provider]  metFORMIN (GLUCOPHAGE-XR) 500 MG 24 hr tablet TAKE 1 TABLET BY MOUTH TWICE A DAY WITH BREAKFAST AND DINNER 12/25/20   Gifford Shave, MD  metoprolol succinate (TOPROL-XL) 50 MG 24 hr tablet TAKE 1 TABLET BY MOUTH EVERYDAY AT BEDTIME 06/05/20   Gifford Shave, MD  metoprolol tartrate (LOPRESSOR) 25 MG tablet TAKE 1 TABLET BY MOUTH TWICE A DAY Patient not taking: Reported on 11/14/2019 01/20/19   Everrett Coombe, MD  nitroGLYCERIN (NITROSTAT) 0.4 MG SL tablet Place 1 tablet (0.4 mg total) under the tongue every 5 (five) minutes as needed for chest pain. 07/12/19   Zenia Resides, MD  OneTouch Delica Lancets 09Q MISC 1 Container by Does not apply route as needed. 06/07/19   Zenia Resides, MD  rosuvastatin (CRESTOR) 20 MG tablet TAKE 1 TABLET BY MOUTH EVERYDAY AT BEDTIME 06/05/20   Gifford Shave, MD    Allergies    Patient has no known allergies.  Review of Systems   Review of Systems  Constitutional: Negative for appetite change.  HENT: Negative for congestion.   Respiratory: Negative for shortness of breath.   Cardiovascular:  Positive for chest pain.  Gastrointestinal: Positive for abdominal pain and nausea. Negative for vomiting.  Genitourinary: Negative for flank pain.  Musculoskeletal: Negative for back pain.  Skin: Negative for rash.  Neurological: Negative for weakness.  Psychiatric/Behavioral: Negative for confusion.    Physical Exam Updated Vital Signs BP 139/74 (BP Location: Right Arm)   Pulse (!) 51   Temp 98 F (36.7 C) (  Oral)   Resp 15   Ht 5\' 2"  (1.575 m)   Wt 59.5 kg   SpO2 98%   BMI 23.99 kg/m   Physical Exam Vitals and nursing note reviewed.  HENT:     Head: Normocephalic.  Cardiovascular:     Rate and Rhythm: Normal rate and regular rhythm.  Pulmonary:     Breath sounds: Normal breath sounds.  Abdominal:     Tenderness: There is abdominal tenderness.     Hernia: No hernia is present.     Comments: Epigastric tenderness no rebound or guarding.  No hernia palpated.  Skin:    General: Skin is warm.     Capillary Refill: Capillary refill takes less than 2 seconds.  Neurological:     Mental Status: She is alert and oriented to person, place, and time.     ED Results / Procedures / Treatments   Labs (all labs ordered are listed, but only abnormal results are displayed) Labs Reviewed  COMPREHENSIVE METABOLIC PANEL - Abnormal; Notable for the following components:      Result Value   Glucose, Bld 107 (*)    GFR, Estimated 58 (*)    All other components within normal limits  URINALYSIS, ROUTINE W REFLEX MICROSCOPIC - Abnormal; Notable for the following components:   APPearance HAZY (*)    Glucose, UA >=500 (*)    All other components within normal limits  LIPASE, BLOOD  CBC  TROPONIN I (HIGH SENSITIVITY)    EKG EKG Interpretation  Date/Time:  Monday January 21 2021 11:14:26 EDT Ventricular Rate:  60 PR Interval:  220 QRS Duration: 118 QT Interval:  436 QTC Calculation: 436 R Axis:   93 Text Interpretation: Sinus rhythm with 1st degree A-V block Right bundle branch  block Abnormal ECG Confirmed by Davonna Belling [phone]) on 01/21/2021 2:34:42 PM   Radiology No results found.  Procedures Procedures   Medications Ordered in ED Medications - No data to display  ED Course  I have reviewed the triage vital signs and the nursing notes.  Pertinent labs & imaging results that were available during my care of the patient were reviewed by me and considered in my medical decision making (see chart for details).    MDM Rules/Calculators/A&P                          Patient with epigastric pain.  Upper abdomen.  Has been gassy.  Worse with eating.  Not exertional.  EKG reassuring.  Doubt cardiac cause.  However does have some epigastric pain.  Will add ultrasound to evaluate for biliary cause since comes pain.  If negative I think likely able to discharge home.  Has improved since she had been on some GI medications.  Follow-up with her primary care doctor care turned over to Dr. Eulis Foster. Final Clinical Impression(s) / ED Diagnoses Final diagnoses:  Abdominal pain    Rx / DC Orders ED Discharge Orders    None       Davonna Belling, MD 01/21/21 1606

## 2021-01-21 NOTE — ED Notes (Signed)
Notified Wentz MD about pt CBG 58. Provided pt w/ sugar OJ, and PB crackers

## 2021-01-21 NOTE — Discharge Instructions (Addendum)
It is not clear what is causing your pain.  Your symptoms may be related to gastritis.  Try taking Maalox, before meals and at bedtime for 2 or 3 weeks.  The testing today did show a gallstone in her gallbladder without complications.  Call the general surgeon for follow-up appointment to be seen about it.  The testing also showed a right sided kidney mass, that appears to be a cyst.  These usually are not a problem but you should see a urologist, to make sure.  Call the doctor listed for a follow-up appointment.  Also follow-up with your primary care doctor for checkup in a week or 2.  They can help you with all of these difficulties as well.

## 2021-01-21 NOTE — ED Notes (Signed)
Reviewed discharge instructions with patient and daughter. Follow-up care and medication recommendations reviewed. Patient and daughter verbalized understanding. Patient A&Ox4, VSS, and ambulatory with steady gait upon discharge.

## 2021-01-21 NOTE — ED Provider Notes (Signed)
3:30 PM-checkout from Dr. Alvino Chapel to evaluate patient after return of ultrasound and troponin testing to evaluate upper abdominal pain.  8:10 PM-testing has returned.  Troponin negative, ultrasound shows a single uncomplicated gallstone, and right renal cyst, moderately large.  At this time the patient is resting comfortably and eating Cheetos.  She is in no apparent distress vital signs taken at 1930 are reassuring.  I discussed the findings with the patient as well as her granddaughter who is at the bedside.  At this time the patient has mild epigastric tenderness.  There is no right upper quadrant tenderness to palpation.  I explained all the findings including solitary gallstone, right renal cyst, and we discussed her symptoms which are largely midline pain in her lower chest and upper abdomen, as she is eating, which tends to persist for a while afterwards.  She does not have vomiting, fever, weakness or dizziness.  I explained to her and her granddaughter that these problems can be managed as an outpatient.  I will refer her to general surgery, for gallstone, urology for renal cyst, and PCP, for possible gastritis.  She will be advised to follow a low-fat diet, and use an antacid AC and at bedtime as well as use Tylenol for pain.  Patient and her granddaughter are agreeable   Daleen Bo, MD 01/21/21 2024

## 2021-01-21 NOTE — ED Notes (Signed)
Patient transported to Ultrasound 

## 2021-02-12 ENCOUNTER — Encounter: Payer: Self-pay | Admitting: Family Medicine

## 2021-02-12 ENCOUNTER — Ambulatory Visit (INDEPENDENT_AMBULATORY_CARE_PROVIDER_SITE_OTHER): Payer: Medicare Other | Admitting: Family Medicine

## 2021-02-12 ENCOUNTER — Ambulatory Visit
Admission: RE | Admit: 2021-02-12 | Discharge: 2021-02-12 | Disposition: A | Discharge status: Home / Self Care | Payer: Medicare Other | Source: Ambulatory Visit | Attending: Family Medicine | Admitting: Family Medicine

## 2021-02-12 ENCOUNTER — Other Ambulatory Visit: Payer: Self-pay

## 2021-02-12 VITALS — BP 124/72 | HR 53 | Ht 62.0 in | Wt 129.4 lb

## 2021-02-12 DIAGNOSIS — M25552 Pain in left hip: Secondary | ICD-10-CM | POA: Insufficient documentation

## 2021-02-12 DIAGNOSIS — M1612 Unilateral primary osteoarthritis, left hip: Secondary | ICD-10-CM | POA: Diagnosis not present

## 2021-02-12 DIAGNOSIS — M47816 Spondylosis without myelopathy or radiculopathy, lumbar region: Secondary | ICD-10-CM | POA: Diagnosis not present

## 2021-02-12 NOTE — Progress Notes (Signed)
    SUBJECTIVE:   CHIEF COMPLAINT / HPI:   Left Hip Pain Patient presents for left hip pain that has been progressively worsening over the past 6 months. Pain is located in her anterior hip and into her upper medial thigh. She has been taking Tylenol (2 regular strength tablets) before bed most nights due to the pain. Tylenol provides slight relief.  The pain is worse with any type of movement, particularly with lifting her left leg, walking, and going up stairs. She was given home PT exercises in the past but was unable to do them secondary to pain. Denies any radiation of pain, no numbness or tingling, no falls or trauma, no other joint pain, no systemic symptoms.   PERTINENT  PMH / PSH: History of breast and cervical cancer  OBJECTIVE:   BP 124/72   Pulse (!) 53   Ht 5\' 2"  (1.575 m)   Wt 129 lb 6.4 oz (58.7 kg)   SpO2 97%   BMI 23.67 kg/m   Gen: alert, well-appearing, NAD Back: no deformity, no bony tenderness, full ROM (flexion, extension, rotation) Left Hip:  - Inspection: No gross deformity, no swelling, leg length equal bilaterally - Palpation: No TTP, specifically none over greater trochanter - ROM: Active flexion limited secondary to pain (unable to lift leg off exam table). Passive flexion intact but painful. Pain with internal rotation. Extension, abduction and external rotation normal. - Neuro/vasc: NV intact distally - Special Tests: Negative Trendelenberg.  Positive FADIR - Gait: no significant abnormality   ASSESSMENT/PLAN:   Left hip pain Progressive x6 months. No trauma or injury. Significantly limited flexion and pain with internal rotation on exam. Suspect osteoarthritis. Could also consider other MSK etiology such as hip flexor muscle strain or iliopsoas bursitis. No concern for infectious etiology, sciatica or greater tronchanteric bursitis at this time. Much less likely RA or bony mets.    -Will obtain x-ray of the left hip -Referral to sports  medicine -Continue Tylenol as needed for pain relief (options limited due to age/comorbidities)    Alcus Dad, MD [place]

## 2021-02-12 NOTE — Assessment & Plan Note (Addendum)
Progressive x6 months. No trauma or injury. Significantly limited flexion and pain with internal rotation on exam. Suspect osteoarthritis. Could also consider other MSK etiology such as hip flexor muscle strain or iliopsoas bursitis. No concern for infectious etiology, sciatica or greater tronchanteric bursitis at this time. Much less likely RA or bony mets.    -Will obtain x-ray of the left hip -Referral to sports medicine -Continue Tylenol as needed for pain relief (options limited due to age/comorbidities)

## 2021-02-12 NOTE — Patient Instructions (Signed)
It was great to meet you!  Our plans for today:  - Please go to [facility] (in [facility]) to get x-rays of your hip - I have placed a referral to sports medicine. They will call you to schedule an appointment - You can keep taking Tylenol as needed for pain   Take care and seek immediate care sooner if you develop any concerns.   Dr. [facility]

## 2021-02-20 DIAGNOSIS — Z955 Presence of coronary angioplasty implant and graft: Secondary | ICD-10-CM | POA: Diagnosis not present

## 2021-02-20 DIAGNOSIS — K8020 Calculus of gallbladder without cholecystitis without obstruction: Secondary | ICD-10-CM | POA: Diagnosis not present

## 2021-02-22 ENCOUNTER — Ambulatory Visit (INDEPENDENT_AMBULATORY_CARE_PROVIDER_SITE_OTHER): Payer: Medicare Other | Admitting: Family Medicine

## 2021-02-22 ENCOUNTER — Other Ambulatory Visit: Payer: Self-pay

## 2021-02-22 VITALS — BP 143/64 | Ht 62.0 in | Wt 129.0 lb

## 2021-02-22 DIAGNOSIS — M25552 Pain in left hip: Secondary | ICD-10-CM | POA: Diagnosis not present

## 2021-02-22 MED ORDER — METHYLPREDNISOLONE ACETATE 40 MG/ML IJ SUSP
40.0000 mg | Freq: Once | INTRAMUSCULAR | Status: AC
  Administered 2021-02-22: 40 mg via INTRA_ARTICULAR

## 2021-02-22 NOTE — Progress Notes (Signed)
[facility]: Attending Note: I have reviewed the chart, discussed wit the Sports Medicine Fellow. I agree with assessment and treatment plan as detailed in the [place] note. I talked to both her and her daughter and asked them if she is she has not improved significantly in 4 weeks to call back.  I am a little concerned that she might have avascular necrosis as her hip pain is quite apparent and her x-ray is not that significant.  Notably she is also had 2 different types of cancer but I see no indication on her x-rays of any type of metastasis.  If she has little relief from this, I would consider MRI of the left hip.

## 2021-02-22 NOTE — Assessment & Plan Note (Signed)
Histroy, exam, x-rays all consistent with progressively worsening osteoarthritis of the hip. - Corticosteroid injection today as noted below. - Start formal physical therapy for help with strength - F/u as needed

## 2021-02-22 NOTE — Progress Notes (Signed)
    SUBJECTIVE:   CHIEF COMPLAINT / HPI:   Left Hip Pain [name] is a very pleasant 80 year old female accompanied by her daughter today for evaluation of left hip pain ongoing for 6 months.  It has been slow and progressive getting worse with activity.  It is not waking her up at night or keeping her from sleep.  It is worse with things such as walking and going up stairs.  She has had no falls or trauma.  The pain is located in the left groin region and does not radiate anywhere else.  PERTINENT  PMH / PSH: CAD, hypertension, type 2 diabetes, hyperlipidemia, history of breast cancer  OBJECTIVE:   BP (!) 143/64   Ht 5\' 2"  (1.575 m)   Wt 129 lb (58.5 kg)   BMI 23.59 kg/m   No flowsheet data found.  Hip, Left: No obvious rash, erythema, ecchymosis, or edema. ROM full in all directions; Strength 4/5 in IR/ER/Flex/Ext/Abd/Add. Pelvic alignment unremarkable to inspection and palpation. TTP in left groin region. Pain with internal rotation and flexion of the left hip. Positive log roll test. Greater trochanter without tenderness to palpation. No tenderness over piriformis. No SI joint tenderness and normal minimal SI movement.    ASSESSMENT/PLAN:   Left hip pain Histroy, exam, x-rays all consistent with progressively worsening osteoarthritis of the hip. - Corticosteroid injection today as noted below. - Start formal physical therapy for help with strength - F/u as needed   Written and verbal consent was obtained after discussing the risks and benefits of the procedure with the patient. A timeout was performed and the correct site and side was identified and confirmed.  The left femoacetabular joint was cleaned in sterile fashion betadine and alcohol pad. Using ultrasound guidance the femoacetabular joint at the femoral head was identified. 1cc of 40 mg Depo-medrol and 3 cc 1% Lidocaine was injected using a anterolateral approach using a 5 cc syringe and 25 gauge 3 and 1/2 in  needle. The injection was done under ultrasound guidance and performed in plane. The needle was well visualized and injection was well visualized during the entirety of the procedure. No complications were encountered. Minimal blood loss. A band aid was applied.    Nuala Alpha, DO PGY-4, Sports Medicine Fellow [place]

## 2021-02-22 NOTE — Patient Instructions (Signed)
Declined

## 2021-02-25 DIAGNOSIS — E559 Vitamin D deficiency, unspecified: Secondary | ICD-10-CM | POA: Diagnosis not present

## 2021-02-25 DIAGNOSIS — I10 Essential (primary) hypertension: Secondary | ICD-10-CM | POA: Diagnosis not present

## 2021-02-25 DIAGNOSIS — I2510 Atherosclerotic heart disease of native coronary artery without angina pectoris: Secondary | ICD-10-CM | POA: Diagnosis not present

## 2021-02-25 DIAGNOSIS — I5030 Unspecified diastolic (congestive) heart failure: Secondary | ICD-10-CM | POA: Diagnosis not present

## 2021-02-25 DIAGNOSIS — E785 Hyperlipidemia, unspecified: Secondary | ICD-10-CM | POA: Diagnosis not present

## 2021-02-25 DIAGNOSIS — E119 Type 2 diabetes mellitus without complications: Secondary | ICD-10-CM | POA: Diagnosis not present

## 2021-02-26 DIAGNOSIS — E785 Hyperlipidemia, unspecified: Secondary | ICD-10-CM | POA: Diagnosis not present

## 2021-02-26 DIAGNOSIS — I10 Essential (primary) hypertension: Secondary | ICD-10-CM | POA: Diagnosis not present

## 2021-02-26 DIAGNOSIS — E119 Type 2 diabetes mellitus without complications: Secondary | ICD-10-CM | POA: Diagnosis not present

## 2021-03-23 ENCOUNTER — Other Ambulatory Visit: Payer: Self-pay | Admitting: Family Medicine

## 2021-03-25 ENCOUNTER — Other Ambulatory Visit: Payer: Self-pay

## 2021-03-25 ENCOUNTER — Ambulatory Visit (INDEPENDENT_AMBULATORY_CARE_PROVIDER_SITE_OTHER): Payer: Medicare Other | Admitting: Family Medicine

## 2021-03-25 ENCOUNTER — Ambulatory Visit (INDEPENDENT_AMBULATORY_CARE_PROVIDER_SITE_OTHER): Payer: Medicare Other

## 2021-03-25 VITALS — BP 127/64 | HR 59 | Ht 62.0 in | Wt 128.2 lb

## 2021-03-25 DIAGNOSIS — Z23 Encounter for immunization: Secondary | ICD-10-CM | POA: Diagnosis not present

## 2021-03-25 DIAGNOSIS — N898 Other specified noninflammatory disorders of vagina: Secondary | ICD-10-CM | POA: Diagnosis not present

## 2021-03-25 LAB — POCT WET PREP (WET MOUNT)
Clue Cells Wet Prep Whiff POC: NEGATIVE
Trichomonas Wet Prep HPF POC: ABSENT

## 2021-03-25 NOTE — Patient Instructions (Signed)
Dear Dana Coleman,   Today we discussed the following:   Vaginal Itching   We obtain some testing today to make sure that this is not an infection.  If the test comes back positive we will call you with further management instructions. This is likely due to decreased estrogen.  I provided information on this topic.  It is called atrophic vaginitis.  The treatment for this is moisturizing lubricants.  I recommend Replens vaginal moisturizer.  I placed a picture below.  If you do not have any improvement with this in 1 to 2 weeks, please come back as there are other treatment options.     Be well,   Dr. Maudie Mercury  ===================================================================================

## 2021-03-25 NOTE — Progress Notes (Addendum)
   SUBJECTIVE:  CHIEF COMPLAINT / HPI:   Vaginal itching Patient reports that she has had vaginal itching for the last week.  She is not sexually active.  The vaginal itching is present on the outside and around her labia.  She denies any intravaginal issues.  She denies any discharge, dysuria.  She has not experienced this in the past.  She reports that the area feels dry and she has been using Vaseline without any improvement.  Need for COVID booster Provided COVID booster today  PERTINENT  PMH / PSH:  Patient Active Problem List   Diagnosis Date Noted   Vaginal itching 03/25/2021   Left hip pain 02/12/2021   GERD (gastroesophageal reflux disease) 12/19/2020   Neck muscle spasm 02/16/2019   Breast lump 12/07/2018   Plantar fasciitis, right 09/30/2018   Plantar wart 11/17/2017   Insulin dependent type 2 diabetes mellitus, uncontrolled ([place]) 08/14/2014   History of thyroid nodule 01/16/2011   History of breast cancer (right)  stage 1 ERA positive    History of cervical cancer    Hyperlipidemia    Hypertension    CAD (coronary artery disease), native coronary artery    Osteopenia 12/10/2006    PHQ9 SCORE ONLY 03/25/2021 02/12/2021 12/18/2020  PHQ-9 Total Score 0 3 4   OBJECTIVE:  BP 127/64   Pulse (!) 59   Ht 5\' 2"  (1.575 m)   Wt 128 lb 3.2 oz (58.2 kg)   SpO2 98%   BMI 23.45 kg/m   General: well appearing, NAD  GU: Chaperone present for exam.  Labia with minor edema. No obvious erythema on external vaginal exam, no tobacco paper appearance.  Absent Rougier of vaginal walls.  No discharge appreciated.     ASSESSMENT/PLAN:  Vaginal itching Obtain wet prep to rule out BV or yeast infection, which was negative.  This is likely due to atrophic vaginitis given patient's age.  We will treat conservatively with lubrication and schedule follow-up if no improvement.    Wilber Oliphant, MD [place]

## 2021-03-25 NOTE — Assessment & Plan Note (Addendum)
Obtain wet prep to rule out BV or yeast infection, which was negative.  This is likely due to atrophic vaginitis given patient's age.  We will treat conservatively with lubrication and schedule follow-up if no improvement.

## 2021-03-29 ENCOUNTER — Emergency Department (HOSPITAL_BASED_OUTPATIENT_CLINIC_OR_DEPARTMENT_OTHER): Payer: Medicare Other

## 2021-03-29 ENCOUNTER — Other Ambulatory Visit: Payer: Self-pay

## 2021-03-29 ENCOUNTER — Encounter (HOSPITAL_COMMUNITY): Payer: Self-pay | Admitting: *Deleted

## 2021-03-29 ENCOUNTER — Emergency Department (HOSPITAL_COMMUNITY)
Admission: EM | Admit: 2021-03-29 | Discharge: 2021-03-29 | Disposition: A | Discharge status: Home / Self Care | Payer: Medicare Other | Attending: Emergency Medicine | Admitting: Emergency Medicine

## 2021-03-29 ENCOUNTER — Emergency Department (HOSPITAL_COMMUNITY): Payer: Medicare Other

## 2021-03-29 DIAGNOSIS — R059 Cough, unspecified: Secondary | ICD-10-CM | POA: Insufficient documentation

## 2021-03-29 DIAGNOSIS — R0602 Shortness of breath: Secondary | ICD-10-CM | POA: Diagnosis not present

## 2021-03-29 DIAGNOSIS — E119 Type 2 diabetes mellitus without complications: Secondary | ICD-10-CM | POA: Diagnosis not present

## 2021-03-29 DIAGNOSIS — R0789 Other chest pain: Secondary | ICD-10-CM | POA: Diagnosis not present

## 2021-03-29 DIAGNOSIS — R42 Dizziness and giddiness: Secondary | ICD-10-CM | POA: Insufficient documentation

## 2021-03-29 DIAGNOSIS — Z7984 Long term (current) use of oral hypoglycemic drugs: Secondary | ICD-10-CM | POA: Insufficient documentation

## 2021-03-29 DIAGNOSIS — I70202 Unspecified atherosclerosis of native arteries of extremities, left leg: Secondary | ICD-10-CM | POA: Insufficient documentation

## 2021-03-29 DIAGNOSIS — I10 Essential (primary) hypertension: Secondary | ICD-10-CM | POA: Diagnosis not present

## 2021-03-29 DIAGNOSIS — Z794 Long term (current) use of insulin: Secondary | ICD-10-CM | POA: Diagnosis not present

## 2021-03-29 DIAGNOSIS — R079 Chest pain, unspecified: Secondary | ICD-10-CM

## 2021-03-29 DIAGNOSIS — I2510 Atherosclerotic heart disease of native coronary artery without angina pectoris: Secondary | ICD-10-CM | POA: Diagnosis not present

## 2021-03-29 DIAGNOSIS — E041 Nontoxic single thyroid nodule: Secondary | ICD-10-CM | POA: Diagnosis not present

## 2021-03-29 DIAGNOSIS — Z8541 Personal history of malignant neoplasm of cervix uteri: Secondary | ICD-10-CM | POA: Insufficient documentation

## 2021-03-29 DIAGNOSIS — R55 Syncope and collapse: Secondary | ICD-10-CM | POA: Diagnosis not present

## 2021-03-29 DIAGNOSIS — Z7982 Long term (current) use of aspirin: Secondary | ICD-10-CM | POA: Insufficient documentation

## 2021-03-29 DIAGNOSIS — R2241 Localized swelling, mass and lump, right lower limb: Secondary | ICD-10-CM | POA: Insufficient documentation

## 2021-03-29 DIAGNOSIS — M7989 Other specified soft tissue disorders: Secondary | ICD-10-CM | POA: Diagnosis not present

## 2021-03-29 DIAGNOSIS — Z79899 Other long term (current) drug therapy: Secondary | ICD-10-CM | POA: Diagnosis not present

## 2021-03-29 DIAGNOSIS — R911 Solitary pulmonary nodule: Secondary | ICD-10-CM | POA: Diagnosis not present

## 2021-03-29 DIAGNOSIS — N281 Cyst of kidney, acquired: Secondary | ICD-10-CM | POA: Diagnosis not present

## 2021-03-29 DIAGNOSIS — J8410 Pulmonary fibrosis, unspecified: Secondary | ICD-10-CM | POA: Diagnosis not present

## 2021-03-29 LAB — CBC WITH DIFFERENTIAL/PLATELET
Abs Immature Granulocytes: 0.02 10*3/uL (ref 0.00–0.07)
Basophils Absolute: 0 10*3/uL (ref 0.0–0.1)
Basophils Relative: 0 %
Eosinophils Absolute: 0.2 10*3/uL (ref 0.0–0.5)
Eosinophils Relative: 2 %
HCT: 38.2 % (ref 36.0–46.0)
Hemoglobin: 12 g/dL (ref 12.0–15.0)
Immature Granulocytes: 0 %
Lymphocytes Relative: 28 %
Lymphs Abs: 2.1 10*3/uL (ref 0.7–4.0)
MCH: 29.7 pg (ref 26.0–34.0)
MCHC: 31.4 g/dL (ref 30.0–36.0)
MCV: 94.6 fL (ref 80.0–100.0)
Monocytes Absolute: 0.7 10*3/uL (ref 0.1–1.0)
Monocytes Relative: 9 %
Neutro Abs: 4.7 10*3/uL (ref 1.7–7.7)
Neutrophils Relative %: 61 %
Platelets: 184 10*3/uL (ref 150–400)
RBC: 4.04 MIL/uL (ref 3.87–5.11)
RDW: 14.2 % (ref 11.5–15.5)
WBC: 7.7 10*3/uL (ref 4.0–10.5)
nRBC: 0 % (ref 0.0–0.2)

## 2021-03-29 LAB — COMPREHENSIVE METABOLIC PANEL
ALT: 23 U/L (ref 0–44)
AST: 27 U/L (ref 15–41)
Albumin: 3.6 g/dL (ref 3.5–5.0)
Alkaline Phosphatase: 57 U/L (ref 38–126)
Anion gap: 9 (ref 5–15)
BUN: 13 mg/dL (ref 8–23)
CO2: 28 mmol/L (ref 22–32)
Calcium: 9.4 mg/dL (ref 8.9–10.3)
Chloride: 101 mmol/L (ref 98–111)
Creatinine, Ser: 0.94 mg/dL (ref 0.44–1.00)
GFR, Estimated: >60 mL/min (ref >60)
Glucose, Bld: 96 mg/dL (ref 70–99)
Potassium: 4.3 mmol/L (ref 3.5–5.1)
Sodium: 138 mmol/L (ref 135–145)
Total Bilirubin: 0.5 mg/dL (ref 0.3–1.2)
Total Protein: 7.5 g/dL (ref 6.5–8.1)

## 2021-03-29 LAB — TROPONIN I (HIGH SENSITIVITY)
Troponin I (High Sensitivity): 7 ng/L (ref <18)
Troponin I (High Sensitivity): 8 ng/L (ref <18)

## 2021-03-29 MED ORDER — ACETAMINOPHEN 325 MG PO TABS
650.0000 mg | ORAL_TABLET | Freq: Once | ORAL | Status: AC
  Administered 2021-03-29: 650 mg via ORAL
  Filled 2021-03-29: qty 2

## 2021-03-29 MED ORDER — LIDOCAINE 5 % EX PTCH
1.0000 | MEDICATED_PATCH | Freq: Once | CUTANEOUS | Status: DC
  Administered 2021-03-29: 1 via TRANSDERMAL
  Filled 2021-03-29: qty 1

## 2021-03-29 MED ORDER — IOHEXOL 350 MG/ML SOLN
100.0000 mL | Freq: Once | INTRAVENOUS | Status: AC | PRN
  Administered 2021-03-29: 100 mL via INTRAVENOUS

## 2021-03-29 NOTE — ED Provider Notes (Signed)
Emergency Medicine Provider Triage Evaluation Note  Dana Coleman , a 80 y.o. female  was evaluated in triage.  Pt complains of substernal to right-sided chest pain since yesterday.  She also reports that she has been having cramping in her right leg. She reports cough that started yesterday.  No history of DVT/PE.  Her pain is worse with deep breathing..  Review of Systems  Positive: Chest pain, cough, pain and swelling in right leg Negative: Fevers, chills  Physical Exam  BP 121/74 (BP Location: Left Arm)   Pulse 65   Temp 98.6 F (37 C) (Oral)   Resp 16   Ht 5' 2.5" (1.588 m)   Wt 58.2 kg   SpO2 97%   BMI 23.09 kg/m  Gen:   Awake, no distress   Resp:  Normal effort  MSK:   Moves extremities without difficulty  Other:  Right leg is swollen when compared to the left.  There is tenderness around the right calf.  Medical Decision Making  Medically screening exam initiated at 4:40 PM.  Appropriate orders placed.  Dana Coleman was informed that the remainder of the evaluation will be completed by another provider, this initial triage assessment does not replace that evaluation, and the importance of remaining in the ED until their evaluation is complete.  Note: Portions of this report may have been transcribed using voice recognition software. Every effort was made to ensure accuracy; however, inadvertent computerized transcription errors may be present    Ollen Gross 03/29/21 1641    Truddie Hidden, MD 03/29/21 [phone]

## 2021-03-29 NOTE — ED Notes (Signed)
Patient has now ben called 2 times by reception and another time by transport. No response to any of the calls

## 2021-03-29 NOTE — ED Provider Notes (Signed)
[facility] EMERGENCY DEPARTMENT Provider Note   CSN: [id number] Arrival date & time: 03/29/21  1603     History Chief Complaint  Patient presents with   Chest Pain    [place] Markel is a 80 y.o. female.  [place] Vaccarella is a 80 y.o. female with a history of MI hypertension, hyperlipidemia, diabetes, breast cancer, cervical cancer, arthritis, who presents to the emergency department for evaluation of chest pain.  She reports pain began yesterday morning.  Pain is localized to the right side of the chest, has been constant, seems to be worsened with cough, certain movements and deep breath.  Is not worse with exertion.  Does not radiate.  No associated lightheadedness or syncope.  She has noted some slight swelling in the right leg as well as some intermittent cramping in the leg.  No similar swelling in the left leg but she does report that sometimes her left leg feels like it is getting give out when she is up walking.  No associated abdominal pain, nausea, vomiting or diaphoresis.  She took 2 Tylenol without much improvement yesterday, has not tried anything else to treat symptoms.  Is followed by Dr. Terrence Dupont with cardiology.  No other aggravating or alleviating factors.  The history is provided by the patient and a relative.      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   Hyperlipidemia    Hypertension    Personal history of radiation therapy 2009    Patient Active Problem List   Diagnosis Date Noted   Vaginal itching 03/25/2021   Left hip pain 02/12/2021   GERD (gastroesophageal reflux disease) 12/19/2020   Neck muscle spasm  02/16/2019   Breast lump 12/07/2018   Plantar fasciitis, right 09/30/2018   Plantar wart 11/17/2017   Insulin dependent type 2 diabetes mellitus, uncontrolled ([place]) 08/14/2014   History of thyroid nodule 01/16/2011   History of breast cancer (right)  stage 1 ERA positive    History of cervical cancer    Hyperlipidemia    Hypertension    CAD (coronary artery disease), native coronary artery    Osteopenia 12/10/2006    Past Surgical History:  Procedure Laterality Date   BREAST LUMPECTOMY Right 2009   BREAST SURGERY  2009   right lumpectomy   CERVIX SURGERY  1980   COLONOSCOPY     LEFT HEART CATHETERIZATION WITH CORONARY ANGIOGRAM N/A 04/20/2014   Procedure: LEFT HEART CATHETERIZATION WITH CORONARY ANGIOGRAM;  Surgeon: Jacolyn Reedy, MD;  Location: [facility] CATH LAB;  Service: Cardiovascular;  Laterality: N/A;   POLYPECTOMY     thyroid     for nodule hemithyroidectomy, benign   THYROIDECTOMY Right 03/2003     OB History   No obstetric history on file.     Family History  Problem Relation Age of Onset   Heart disease Mother        age 105   Heart disease Father    Cancer Father        unknown   Heart disease  Brother    Hypertension Brother    Diabetes Daughter    Stroke Daughter    Cirrhosis Brother    Colon cancer Neg Hx    Colon polyps Neg Hx    Esophageal cancer Neg Hx    Stomach cancer Neg Hx    Rectal cancer Neg Hx     Social History   Tobacco Use   Smoking status: Never   Smokeless tobacco: Never   Tobacco comments:    No plans to start  Vaping Use   Vaping Use: Never used  Substance Use Topics   Alcohol use: No   Drug use: No    Home Medications Prior to Admission medications   Medication Sig Start Date End Date Taking? Authorizing Provider  amLODipine (NORVASC) 10 MG tablet TAKE 1 TABLET BY MOUTH EVERY DAY 07/20/20  Yes Gifford Shave, MD  aspirin 81 MG chewable tablet Chew 81 mg by mouth daily.   Yes [provider]  Calcium Citrate 250  MG TABS Take 1 tablet by mouth daily.   Yes [provider]  cholecalciferol 2000 UNITS tablet Take 2,000 Units by mouth daily. 01/16/11  Yes Briscoe, Jannifer Rodney, MD  HUMALOG KWIKPEN 100 UNIT/ML KiwkPen INJECT 6 UNITS INTO THE SKIN DAILY BEFORE SUPPER Patient taking differently: Inject 6 Units into the skin every evening. 07/17/17  Yes Everrett Coombe, MD  hydrochlorothiazide (HYDRODIURIL) 12.5 MG tablet TAKE 1 TABLET BY MOUTH EVERY DAY 09/25/20  Yes Gifford Shave, MD  JARDIANCE 10 MG TABS tablet TAKE 1 TABLET BY MOUTH EVERY DAY 12/05/20  Yes Gifford Shave, MD  LANTUS SOLOSTAR 100 UNIT/ML Solostar Pen INJECT 24 UNITS INTO THE SKIN EVERY MORNING. 12/31/20  Yes Gifford Shave, MD  losartan (COZAAR) 50 MG tablet Take 50 mg by mouth daily. 05/24/19  Yes [provider]  metFORMIN (GLUCOPHAGE-XR) 500 MG 24 hr tablet TAKE 1 TABLET BY MOUTH TWICE A DAY WITH BREAKFAST AND DINNER 03/25/21  Yes Gifford Shave, MD  metoprolol succinate (TOPROL-XL) 100 MG 24 hr tablet Take 100 mg by mouth at bedtime. 02/16/21  Yes [provider]  nitroGLYCERIN (NITROSTAT) 0.4 MG SL tablet Place 1 tablet (0.4 mg total) under the tongue every 5 (five) minutes as needed for chest pain. 07/12/19  Yes Hensel, Jamal Collin, MD  omeprazole (PRILOSEC) 40 MG capsule Take 1 capsule by mouth every morning. 12/12/20  Yes [provider]  rosuvastatin (CRESTOR) 20 MG tablet TAKE 1 TABLET BY MOUTH EVERYDAY AT BEDTIME Patient taking differently: Take 20 mg by mouth in the morning. 06/05/20  Yes Gifford Shave, MD  aspirin 81 MG chewable tablet Chew 1 tablet (81 mg total) by mouth daily. Patient not taking: Reported on 03/29/2021 06/16/13   Patrecia Pour, MD  Blood Glucose Monitoring Suppl MISC One touch ultra glucose monitor. Check blood sugar twice a day.    [provider]  glucose blood (ONE TOUCH ULTRA TEST) test strip CHECK BLOOD SUGAR TWICE DAILY 09/24/18   Everrett Coombe, MD  Insulin Pen Needle (B-D UF III  MINI PEN NEEDLES) 31G X 5 MM MISC INJECT INSULIN VIA PEN 6 TIMES DAILY 02/22/18   Everrett Coombe, MD  metoprolol succinate (TOPROL-XL) 50 MG 24 hr tablet TAKE 1 TABLET BY MOUTH EVERYDAY AT BEDTIME Patient not taking: Reported on 03/29/2021 06/05/20   Gifford Shave, MD  OneTouch Delica Lancets [address] [place] 1 Container by Does not apply route as needed. 06/07/19   Zenia Resides, MD    Allergies  Patient has no known allergies.  Review of Systems   Review of Systems  Constitutional:  Negative for chills and fever.  HENT: Negative.    Respiratory:  Positive for cough. Negative for shortness of breath.   Cardiovascular:  Positive for chest pain and leg swelling. Negative for palpitations.  Gastrointestinal:  Negative for abdominal pain, diarrhea, nausea and vomiting.  Genitourinary:  Negative for dysuria.  Musculoskeletal:  Positive for myalgias. Negative for arthralgias.  Skin:  Negative for color change and rash.  Neurological:  Positive for weakness (Left leg weakness, intermittently with ambulation). Negative for dizziness, syncope, light-headedness and numbness.  All other systems reviewed and are negative.  Physical Exam Updated Vital Signs BP (!) 150/77   Pulse 61   Temp 98.6 F (37 C) (Oral)   Resp 11   Ht 5' 2.5" (1.588 m)   Wt 58.2 kg   SpO2 100%   BMI 23.09 kg/m   Physical Exam Vitals and nursing note reviewed.  Constitutional:      General: She is not in acute distress.    Appearance: Normal appearance. She is well-developed. She is not diaphoretic.  HENT:     Head: Normocephalic and atraumatic.  Eyes:     General:        Right eye: No discharge.        Left eye: No discharge.     Pupils: Pupils are equal, round, and reactive to light.  Cardiovascular:     Rate and Rhythm: Normal rate and regular rhythm.     Pulses:          Radial pulses are 2+ on the right side and 2+ on the left side.       Dorsalis pedis pulses are detected w/ Doppler on the right side and  detected w/ Doppler on the left side.     Heart sounds: Normal heart sounds. No murmur heard.   No friction rub. No gallop.     Comments: Pulses present in bilateral lower extremities with Doppler but pulses significantly weaker on the left than right Pulmonary:     Effort: Pulmonary effort is normal. No respiratory distress.     Breath sounds: Normal breath sounds. No wheezing or rales.     Comments: Respirations equal and unlabored, patient able to speak in full sentences, lungs clear to auscultation bilaterally, pain sometimes worsened with deep breath Chest:     Chest wall: No mass or tenderness.     Comments: No tenderness over the chest wall, no masses noted Abdominal:     General: Bowel sounds are normal. There is no distension.     Palpations: Abdomen is soft. There is no mass.     Tenderness: There is no abdominal tenderness. There is no guarding.     Comments: Abdomen soft, nondistended, nontender to palpation in all quadrants without guarding or peritoneal signs  Musculoskeletal:        General: No deformity.     Cervical back: Neck supple.     Comments: Very minimal swelling noted on the right compared to left, bilateral lower extremities nontender without discoloration, warm to touch.  Skin:    General: Skin is warm and dry.     Capillary Refill: Capillary refill takes less than 2 seconds.  Neurological:     Mental Status: She is alert and oriented to person, place, and time.     Coordination: Coordination normal.     Comments: Speech is clear, able to follow commands Moves  extremities without ataxia, coordination intact  Psychiatric:        Mood and Affect: Mood normal.        Behavior: Behavior normal.    ED Results / Procedures / Treatments   Labs (all labs ordered are listed, but only abnormal results are displayed) Labs Reviewed  COMPREHENSIVE METABOLIC PANEL  CBC WITH DIFFERENTIAL/PLATELET  TROPONIN I (HIGH SENSITIVITY)  TROPONIN I (HIGH SENSITIVITY)     EKG EKG Interpretation  Date/Time:  Friday March 29 2021 16:14:59 EDT Ventricular Rate:  63 PR Interval:  234 QRS Duration: 122 QT Interval:  420 QTC Calculation: 429 R Axis:   90 Text Interpretation: Sinus rhythm with 1st degree A-V block with occasional Premature ventricular complexes Right bundle branch block Abnormal ECG Similar to previous Confirmed by Lavenia Atlas [phone]) on 03/29/2021 8:18:00 PM  Radiology DG Chest 2 View  Result Date: 03/29/2021 CLINICAL DATA:  Chest pain on the right for 2 days, initial encounter EXAM: CHEST - 2 VIEW COMPARISON:  12/12/2017 FINDINGS: Cardiac shadow is enlarged but stable. Tortuous thoracic aorta with calcifications is again identified. Lungs are well aerated bilaterally and no focal infiltrate or effusion is seen. Degenerative changes of the thoracic spine are noted. IMPRESSION: No acute abnormality noted. Electronically Signed   By: Inez Catalina M.D.   On: 03/29/2021 18:34   CT Angio Chest PE W and/or Wo Contrast  Result Date: 03/29/2021 CLINICAL DATA:  Chest pain EXAM: CT ANGIOGRAPHY CHEST WITH CONTRAST TECHNIQUE: Multidetector CT imaging of the chest was performed using the standard protocol during bolus administration of intravenous contrast. Multiplanar CT image reconstructions and MIPs were obtained to evaluate the vascular anatomy. CONTRAST:  116mL OMNIPAQUE IOHEXOL 350 MG/ML SOLN COMPARISON:  Chest x-ray from earlier in the same day. FINDINGS: Cardiovascular: Atherosclerotic calcifications of the thoracic aorta are noted. No aneurysmal dilatation or dissection is seen. Mild cardiac enlargement is noted. Coronary calcifications are seen. Pulmonary artery shows a normal branching pattern bilaterally. No filling defects are identified to suggest pulmonary embolism. Mediastinum/Nodes: Changes of prior right thyroidectomy are noted. 11 mm thyroid nodule is noted within the left lobe of the thyroid. No sizable hilar or mediastinal adenopathy is  noted. The esophagus as visualized is within normal limits. Lungs/Pleura: Lungs are well aerated bilaterally. Scattered calcified granulomas are seen. A 6 mm noncalcified nodule is noted within the right upper lobe best seen on image number 44 of series 7. No other sizable nodules are seen. Upper Abdomen: Visualized upper abdomen is unremarkable. Musculoskeletal: Degenerative changes of the thoracic spine are noted. Review of the MIP images confirms the above findings. IMPRESSION: No evidence of pulmonary emboli. 6 mm noncalcified nodule in the right upper lobe. Non-contrast chest CT at 6-12 months is recommended. If the nodule is stable at time of repeat CT, then future CT at 18-24 months (from today's scan) is considered optional for low-risk patients, but is recommended for high-risk patients. This recommendation follows the consensus statement: Guidelines for Management of Incidental Pulmonary Nodules Detected on CT Images: From the Fleischner Society 2017; Radiology 2017; 284:228-243. Prior granulomatous disease. Evidence of right thyroidectomy. 11 mm left thyroid nodule is noted. No followup recommended (ref: J Am Coll Radiol. 2015 Feb;12(2): 143-50). Aortic Atherosclerosis (ICD10-I70.0). Electronically Signed   By: Inez Catalina M.D.   On: 03/29/2021 19:55   VAS [place] LOWER EXTREMITY VENOUS (DVT) (ONLY [facility] & [facility] 7a-7p)  Result Date: 03/29/2021  Lower Venous DVT Study Patient Name:  DONOVAN GATCHEL  Date of  Exam:   03/29/2021 Medical Rec #: [id number]           Accession #:    [id number] Date of Birth: 04-Dec-1940            Patient Gender: F Patient Age:   60Y Exam [facility] Procedure:      VAS [place] LOWER EXTREMITY VENOUS (DVT) Referring Phys: [id number] Ree Shay HAMMOND --------------------------------------------------------------------------------  Indications: Chest pain, SOB, RIGHT foot intermittent swelling. Other Indications: During examination, patient stated she is unable to walk even                     short distances before experiencing LEFT leg pain. Comparison Study: No prior studies. Performing Technologist: Darlin Coco RDMS,RVT  Examination Guidelines: A complete evaluation includes B-mode imaging, spectral Doppler, color Doppler, and power Doppler as needed of all accessible portions of each vessel. Bilateral testing is considered an integral part of a complete examination. Limited examinations for reoccurring indications may be performed as noted. The reflux portion of the exam is performed with the patient in reverse Trendelenburg.  +---------+---------------+---------+-----------+----------+--------------+ RIGHT    CompressibilityPhasicitySpontaneityPropertiesThrombus Aging +---------+---------------+---------+-----------+----------+--------------+ CFV      Full           Yes      Yes                                 +---------+---------------+---------+-----------+----------+--------------+ SFJ      Full                                                        +---------+---------------+---------+-----------+----------+--------------+ FV Prox  Full                                                        +---------+---------------+---------+-----------+----------+--------------+ FV Mid   Full                                                        +---------+---------------+---------+-----------+----------+--------------+ FV DistalFull                                                        +---------+---------------+---------+-----------+----------+--------------+ PFV      Full                                                        +---------+---------------+---------+-----------+----------+--------------+ POP      Full           Yes      Yes                                 +---------+---------------+---------+-----------+----------+--------------+  PTV      Full                                                         +---------+---------------+---------+-----------+----------+--------------+ PERO     Full                                                        +---------+---------------+---------+-----------+----------+--------------+   +----+---------------+---------+-----------+----------+--------------+ LEFTCompressibilityPhasicitySpontaneityPropertiesThrombus Aging +----+---------------+---------+-----------+----------+--------------+ CFV Full           Yes      Yes                                 +----+---------------+---------+-----------+----------+--------------+    Summary: RIGHT: - There is no evidence of deep vein thrombosis in the lower extremity.  - No cystic structure found in the popliteal fossa.  LEFT: -INCIDENTAL: 75-99% stenosis of the left mid CFA with highest peak velocity of 628 cm/s, post-stenotic turbulence. Dampened, monophasic flow noted in the distal PTA and DPA.  - No evidence of common femoral vein obstruction.  *See table(s) above for measurements and observations. Electronically signed by Harold Barban MD on 03/29/2021 at 10:25:00 PM.    Final     Procedures Procedures   Medications Ordered in ED Medications  lidocaine (LIDODERM) 5 % 1 patch (1 patch Transdermal Patch Applied 03/29/21 2143)  iohexol (OMNIPAQUE) 350 MG/ML injection 100 mL (100 mLs Intravenous Contrast Given 03/29/21 1938)  acetaminophen (TYLENOL) tablet 650 mg (650 mg Oral Given 03/29/21 2143)    ED Course  I have reviewed the triage vital signs and the nursing notes.  Pertinent labs & imaging results that were available during my care of the patient were reviewed by me and considered in my medical decision making (see chart for details).    MDM Rules/Calculators/A&P                          Patient presents to the emergency department with chest pain. Patient nontoxic appearing, in no apparent distress, vitals without significant abnormality. Fairly benign physical exam.   Patient's primary  cardiologist: Dr. Terrence Dupont  DDX: ACS, pulmonary embolism, dissection, pneumothorax, pneumonia, arrhythmia, severe anemia, MSK, GERD, anxiety, abdominal process.   Additional history obtained:  Additional history obtained from chart review & nursing note review.   EKG: Sinus rhythm with first-degree AV block and PACs, appears similar to prior EKGs.  Lab Tests:  I Ordered, reviewed, and interpreted labs, which included:  CBC: No leukocytosis, normal hemoglobin BMP: No electrolyte derangements, normal renal function Troponin: Negative x2  Imaging Studies ordered:  I ordered imaging studies which included CXR, DVT study, PE study, I independently reviewed, formal radiology impression shows:  Chest x-ray: No active cardiopulmonary disease Venous US:  No DVT noted in right lower extremity, incidental finding of >75% of proximal left femoral artery  CT PE study: No evidence of PE, 6 mm lung nodule noted, no other acute findings.  ED Course:   EKG without obvious acute ischemia, delta troponin negative, doubt ACS. CTA without evidence of PE or other  active cardiopulmonary disease, small pulmonary nodule noted.  No evidence of dissection on CTA. Cardiac monitor reviewed, no notable arrhythmias or tachycardia. Patient has appeared hemodynamically stable throughout ER visit and appears safe for discharge with close PCP/cardiology follow up.   DVT study on the right lower extremity without evidence of clot, but incidental finding of > 75% stenosis of the left proximal femoral artery, patient with distal pulses present bilaterally but significantly decreased on the left compared to right.  Patient describes frequent progressive weakness in the left leg when walking that resolves with rest which sounds most consistent with neuro claudication.  I discussed this finding with Dr. Trula Slade with vascular surgery and he recommends outpatient follow-up in the clinic does not feel patient needs admission or  further emergent work-up  Discussed follow-up and return precautions at length with patient and sister, they expressed understanding and agreement.  Discharged home in good condition.  Portions of this note were generated with [employer]. Dictation errors may occur despite best attempts at proofreading.    Final Clinical Impression(s) / ED Diagnoses Final diagnoses:  Right-sided chest pain  Femoral artery stenosis, left [facility])    Rx / DC Orders ED Discharge Orders     None        Jacqlyn Larsen, PA-C 03/30/21 0031    Lorelle Gibbs, DO 04/02/21 1514

## 2021-03-29 NOTE — Progress Notes (Signed)
Lower extremity venous RT study completed.  Preliminary results discussed with [place], DO.   See CV Proc for preliminary results report.   Darlin Coco, RDMS, RVT

## 2021-03-29 NOTE — ED Triage Notes (Signed)
The pt is c/o chest pain since yesterday she describes it as a soreness  she  has coronary artery stents

## 2021-03-29 NOTE — Discharge Instructions (Addendum)
Your chest pain evaluation today has been reassuring, we do not see signs of a heart attack or a blood clot, no pneumonia or breast mass noted.  This may be musculoskeletal pain from coughing and carrying heavy objects recently.  Use Tylenol and over-the-counter Salonpas lidocaine patches for pain you can also alternate between applying ice and heat.  Follow-up with your PCP if not improving.  We did not see any signs of blood clot in your right leg, but incidentally found a significant narrowing of your left femoral artery.  This is probably why your left leg often feels like it is getting weaker as her up walking.  It is very important that you follow-up closely with Dr. Trula Slade with vascular surgery regarding this, please call first thing Monday morning to schedule follow-up.  If you develop severe left leg pain, numbness, worsening or persistent weakness or any discoloration of the leg return to the emergency department immediately.  If you have new or worsening chest pain, shortness of breath, feel like you are to pass out or have any other new or worsening symptoms return to the ED.

## 2021-03-29 NOTE — ED Notes (Signed)
Patient verbalizes understanding of discharge instructions. The importance of following up with vascular was reviewed with pt and support person at bedside. Opportunity for questioning and answers were provided. Pt discharged from ED ambulatory with support person.

## 2021-04-10 ENCOUNTER — Other Ambulatory Visit: Payer: Self-pay | Admitting: *Deleted

## 2021-04-10 DIAGNOSIS — M25561 Pain in right knee: Secondary | ICD-10-CM

## 2021-04-10 DIAGNOSIS — M25562 Pain in left knee: Secondary | ICD-10-CM

## 2021-04-18 ENCOUNTER — Ambulatory Visit: Payer: Self-pay | Admitting: General Surgery

## 2021-04-22 ENCOUNTER — Other Ambulatory Visit: Payer: Self-pay

## 2021-04-22 ENCOUNTER — Ambulatory Visit (HOSPITAL_COMMUNITY)
Admission: RE | Admit: 2021-04-22 | Discharge: 2021-04-22 | Disposition: A | Discharge status: Home / Self Care | Payer: Medicare Other | Source: Ambulatory Visit | Attending: Surgery | Admitting: Surgery

## 2021-04-22 ENCOUNTER — Encounter: Payer: Medicare Other | Admitting: Surgery

## 2021-04-22 DIAGNOSIS — M25562 Pain in left knee: Secondary | ICD-10-CM | POA: Insufficient documentation

## 2021-04-22 DIAGNOSIS — M25561 Pain in right knee: Secondary | ICD-10-CM | POA: Insufficient documentation

## 2021-04-23 DIAGNOSIS — H52203 Unspecified astigmatism, bilateral: Secondary | ICD-10-CM | POA: Diagnosis not present

## 2021-04-23 DIAGNOSIS — E119 Type 2 diabetes mellitus without complications: Secondary | ICD-10-CM | POA: Diagnosis not present

## 2021-04-23 DIAGNOSIS — Z961 Presence of intraocular lens: Secondary | ICD-10-CM | POA: Diagnosis not present

## 2021-04-23 LAB — HM DIABETES EYE EXAM

## 2021-04-25 ENCOUNTER — Telehealth: Payer: Self-pay

## 2021-04-25 NOTE — Patient Instructions (Signed)
DUE TO COVID-19 ONLY ONE VISITOR IS ALLOWED TO COME WITH YOU AND STAY IN THE WAITING ROOM ONLY DURING PRE OP AND PROCEDURE DAY OF SURGERY. THE 1 VISITOR  MAY VISIT WITH YOU AFTER SURGERY IN YOUR PRIVATE ROOM DURING VISITING HOURS ONLY!               Dana Coleman   Your procedure is scheduled on: 05/02/21   Report to [facility] Main  Entrance   Report to admitting at : 7:00 AM     Call this number if you have problems the morning of surgery [phone]    Remember: NO SOLID FOOD AFTER MIDNIGHT THE NIGHT PRIOR TO SURGERY. NOTHING BY MOUTH EXCEPT CLEAR LIQUIDS UNTIL: 6:00 AM . PLEASE FINISH GATORADE DRINK PER SURGEON ORDER  WHICH NEEDS TO BE COMPLETED AT : 6:00 AM  CLEAR LIQUID DIET  Foods Allowed                                                                     Foods Excluded  Coffee and tea, regular and decaf                             liquids that you cannot  Plain Jell-O any favor except red or purple                                           see through such as: Fruit ices (not with fruit pulp)                                     milk, soups, orange juice  Iced Popsicles                                    All solid food Carbonated beverages, regular and diet                                    Cranberry, grape and apple juices Sports drinks like Gatorade Lightly seasoned clear broth or consume(fat free) Sugar, honey syrup  Sample Menu Breakfast                                Lunch                                     Supper Cranberry juice                    Beef broth                            Chicken broth Jell-O  Grape juice                           Apple juice Coffee or tea                        Jell-O                                      Popsicle                                                Coffee or tea                        Coffee or tea  _____________________________________________________________________    BRUSH YOUR TEETH MORNING OF SURGERY AND RINSE YOUR MOUTH OUT, NO CHEWING GUM CANDY OR MINTS.    Take these medicines the morning of surgery with A SIP OF WATER: metoprolol,amlodipine,omeprazole.  How to Manage Your Diabetes Before and After Surgery  Why is it important to control my blood sugar before and after surgery? Improving blood sugar levels before and after surgery helps healing and can limit problems. A way of improving blood sugar control is eating a healthy diet by:  Eating less sugar and carbohydrates  Increasing activity/exercise  Talking with your doctor about reaching your blood sugar goals High blood sugars (greater than 180 mg/dL) can raise your risk of infections and slow your recovery, so you will need to focus on controlling your diabetes during the weeks before surgery. Make sure that the doctor who takes care of your diabetes knows about your planned surgery including the date and location.  How do I manage my blood sugar before surgery? Check your blood sugar at least 4 times a day, starting 2 days before surgery, to make sure that the level is not too high or low. Check your blood sugar the morning of your surgery when you wake up and every 2 hours until you get to the Short Stay unit. If your blood sugar is less than 70 mg/dL, you will need to treat for low blood sugar: Do not take insulin. Treat a low blood sugar (less than 70 mg/dL) with  cup of clear juice (cranberry or apple), 4 glucose tablets, OR glucose gel. Recheck blood sugar in 15 minutes after treatment (to make sure it is greater than 70 mg/dL). If your blood sugar is not greater than 70 mg/dL on recheck, call [phone] for further instructions. Report your blood sugar to the short stay nurse when you get to Short Stay.  If you are admitted to the hospital after surgery: Your blood sugar will be checked by the staff and you will probably be given insulin after surgery (instead of oral diabetes  medicines) to make sure you have good blood sugar levels. The goal for blood sugar control after surgery is 80-180 mg/dL.   WHAT DO I DO ABOUT MY DIABETES MEDICATION?  Do not take oral diabetes medicines (pills) the morning of surgery.  THE DAY BEFORE SURGERY, take Metoprolol and Lantus as usual. DO NOT take Jardiance.     THE MORNING OF SURGERY, DO NOT TAKE ANY DIABETIC MEDICATIONS DAY OF YOUR SURGERY  You may not have any metal on your body including hair pins and              piercings  Do not wear jewelry, make-up, lotions, powders or perfumes, deodorant             Do not wear nail polish on your fingernails.  Do not shave  48 hours prior to surgery.    Do not bring valuables to the hospital. [name]  Contacts, dentures or bridgework may not be worn into surgery.  Leave suitcase in the car. After surgery it may be brought to your room.     Patients discharged the day of surgery will not be allowed to drive home. IF YOU ARE HAVING SURGERY AND GOING HOME THE SAME DAY, YOU MUST HAVE AN ADULT TO DRIVE YOU HOME AND BE WITH YOU FOR 24 HOURS. YOU MAY GO HOME BY TAXI OR UBER OR ORTHERWISE, BUT AN ADULT MUST ACCOMPANY YOU HOME AND STAY WITH YOU FOR 24 HOURS.  Name and phone number of your driver:  Special Instructions: N/A              Please read over the following fact sheets you were given: _____________________________________________________________________           [facility] - Preparing for Surgery Before surgery, you can play an important role.  Because skin is not sterile, your skin needs to be as free of germs as possible.  You can reduce the number of germs on your skin by washing with CHG (chlorahexidine gluconate) soap before surgery.  CHG is an antiseptic cleaner which kills germs and bonds with the skin to continue killing germs even after washing. Please DO NOT use if you have an allergy to  CHG or antibacterial soaps.  If your skin becomes reddened/irritated stop using the CHG and inform your nurse when you arrive at Short Stay. Do not shave (including legs and underarms) for at least 48 hours prior to the first CHG shower.  You may shave your face/neck. Please follow these instructions carefully:  1.  Shower with CHG Soap the night before surgery and the  morning of Surgery.  2.  If you choose to wash your hair, wash your hair first as usual with your  normal  shampoo.  3.  After you shampoo, rinse your hair and body thoroughly to remove the  shampoo.                           4.  Use CHG as you would any other liquid soap.  You can apply chg directly  to the skin and wash                       Gently with a scrungie or clean washcloth.  5.  Apply the CHG Soap to your body ONLY FROM THE NECK DOWN.   Do not use on face/ open                           Wound or open sores. Avoid contact with eyes, ears mouth and genitals (private parts).                       Wash face,  Development worker, international aid (private parts)  with your normal soap.             6.  Wash thoroughly, paying special attention to the area where your surgery  will be performed.  7.  Thoroughly rinse your body with warm water from the neck down.  8.  DO NOT shower/wash with your normal soap after using and rinsing off  the CHG Soap.                9.  Pat yourself dry with a clean towel.            10.  Wear clean pajamas.            11.  Place clean sheets on your bed the night of your first shower and do not  sleep with pets. Day of Surgery : Do not apply any lotions/deodorants the morning of surgery.  Please wear clean clothes to the hospital/surgery center.  FAILURE TO FOLLOW THESE INSTRUCTIONS MAY RESULT IN THE CANCELLATION OF YOUR SURGERY PATIENT SIGNATURE_________________________________  NURSE SIGNATURE__________________________________  ________________________________________________________________________

## 2021-04-25 NOTE — Telephone Encounter (Signed)
Patient's daughter calls today to ask if we have received the results of the ABI. Advised her we did, that I could not give her those results - but there was nothing urgent/emergent in the studies. She is having gallbladder surgery on 05/05/21 - has a follow up appt with [facility] on 8/15. She currently has short distance claudication and pain if she stands in the kitchen too long, otherwise no issues. Advised to keep follow up appt in August, knows to call sooner if other problems develop.

## 2021-04-26 ENCOUNTER — Encounter (HOSPITAL_COMMUNITY): Payer: Self-pay

## 2021-04-26 ENCOUNTER — Encounter (HOSPITAL_COMMUNITY)
Admission: RE | Admit: 2021-04-26 | Discharge: 2021-04-26 | Disposition: A | Discharge status: Home / Self Care | Payer: Medicare Other | Source: Ambulatory Visit | Attending: General Surgery | Admitting: General Surgery

## 2021-04-26 ENCOUNTER — Other Ambulatory Visit: Payer: Self-pay

## 2021-04-26 DIAGNOSIS — Z01812 Encounter for preprocedural laboratory examination: Secondary | ICD-10-CM | POA: Insufficient documentation

## 2021-04-26 LAB — COMPREHENSIVE METABOLIC PANEL
ALT: 24 U/L (ref 0–44)
AST: 29 U/L (ref 15–41)
Albumin: 3.7 g/dL (ref 3.5–5.0)
Alkaline Phosphatase: 52 U/L (ref 38–126)
Anion gap: 10 (ref 5–15)
BUN: 15 mg/dL (ref 8–23)
CO2: 25 mmol/L (ref 22–32)
Calcium: 9.1 mg/dL (ref 8.9–10.3)
Chloride: 104 mmol/L (ref 98–111)
Creatinine, Ser: 0.88 mg/dL (ref 0.44–1.00)
GFR, Estimated: >60 mL/min (ref >60)
Glucose, Bld: 200 mg/dL — ABNORMAL HIGH (ref 70–99)
Potassium: 4.4 mmol/L (ref 3.5–5.1)
Sodium: 139 mmol/L (ref 135–145)
Total Bilirubin: 0.5 mg/dL (ref 0.3–1.2)
Total Protein: 7.3 g/dL (ref 6.5–8.1)

## 2021-04-26 LAB — HEMOGLOBIN A1C
Hgb A1c MFr Bld: 7.4 % — ABNORMAL HIGH (ref 4.8–5.6)
Mean Plasma Glucose: 165.68 mg/dL

## 2021-04-26 LAB — CBC WITH DIFFERENTIAL/PLATELET
Abs Immature Granulocytes: 0.02 10*3/uL (ref 0.00–0.07)
Basophils Absolute: 0 10*3/uL (ref 0.0–0.1)
Basophils Relative: 0 %
Eosinophils Absolute: 0.1 10*3/uL (ref 0.0–0.5)
Eosinophils Relative: 1 %
HCT: 38.8 % (ref 36.0–46.0)
Hemoglobin: 12.1 g/dL (ref 12.0–15.0)
Immature Granulocytes: 0 %
Lymphocytes Relative: 21 %
Lymphs Abs: 1.6 10*3/uL (ref 0.7–4.0)
MCH: 29.2 pg (ref 26.0–34.0)
MCHC: 31.2 g/dL (ref 30.0–36.0)
MCV: 93.7 fL (ref 80.0–100.0)
Monocytes Absolute: 0.7 10*3/uL (ref 0.1–1.0)
Monocytes Relative: 9 %
Neutro Abs: 5.2 10*3/uL (ref 1.7–7.7)
Neutrophils Relative %: 69 %
Platelets: 184 10*3/uL (ref 150–400)
RBC: 4.14 MIL/uL (ref 3.87–5.11)
RDW: 14.3 % (ref 11.5–15.5)
WBC: 7.6 10*3/uL (ref 4.0–10.5)
nRBC: 0 % (ref 0.0–0.2)

## 2021-04-26 LAB — GLUCOSE, CAPILLARY: Glucose-Capillary: 206 mg/dL — ABNORMAL HIGH (ref 70–99)

## 2021-04-26 NOTE — Progress Notes (Addendum)
COVID Vaccine Completed: Date COVID Vaccine completed: COVID vaccine manufacturer: [place]   PCP - Dr. Gifford Shave. Cardiologist - Dr. Terrence Dupont. LOV: 12/11/20  Chest x-ray - 03/29/21 EKG - 12/30/20 Stress Test -  ECHO - 12/12/17 Cardiac Cath -  Pacemaker/ICD device last checked:  Sleep Study -  CPAP -   Fasting Blood Sugar - 100's Checks Blood Sugar ___2__ times a day  Blood Thinner Instructions: Aspirin Instructions: Last Dose:  Anesthesia review: Hx: DIA,HTN,MI  Patient denies shortness of breath, fever, cough and chest pain at PAT appointment   Patient verbalized understanding of instructions that were given to them at the PAT appointment. Patient was also instructed that they will need to review over the PAT instructions again at home before surgery.

## 2021-04-29 NOTE — Progress Notes (Addendum)
Anesthesia Chart Review   Case: [id number] Date/Time: 05/02/21 0845   Procedure: LAPAROSCOPIC CHOLECYSTECTOMY WITH POSSIBLE INTRAOPERATIVE CHOLANGIOGRAM   Anesthesia type: General   Pre-op diagnosis: SYMPTOMATIC CHOLELITHIASIS   Location: [place] [facility]   Surgeons: Greer Pickerel, MD       DISCUSSION:80 y.o. never smoker with h/o HTN, DM II, right breast cancer, CAD (s/p PTCA stenting to RCA), symptomatic cholelithiasis scheduled for above procedure 05/02/21 with Dr. Greer Pickerel.   Pt last seen by cardiology 02/25/2021. Per OV note pt denies palpitations, lightheadedness or syncope, PND, orthopnea, leg swelling. Has not used nitro, exercises ocassionally without any problems. Per Dr. Terrence Dupont, "patient is acceptable risk for laparoscopic cholecystectomy from cardiac point of view."  Anticipate pt can proceed with planned procedure barring acute status change.   VS: BP (!) 127/56   Pulse (!) 52   Temp 36.7 C (Oral)   Resp 18   Ht 5\' 1"  (1.549 m)   Wt 58.1 kg   SpO2 100%   BMI 24.19 kg/m   PROVIDERS: Gifford Shave, MD is PCP   Charolette Forward, MD is Cardiologist  LABS: Labs reviewed: Acceptable for surgery. (all labs ordered are listed, but only abnormal results are displayed)  Labs Reviewed  COMPREHENSIVE METABOLIC PANEL - Abnormal; Notable for the following components:      Result Value   Glucose, Bld 200 (*)    All other components within normal limits  HEMOGLOBIN A1C - Abnormal; Notable for the following components:   Hgb A1c MFr Bld 7.4 (*)    All other components within normal limits  GLUCOSE, CAPILLARY - Abnormal; Notable for the following components:   Glucose-Capillary 206 (*)    All other components within normal limits  CBC WITH DIFFERENTIAL/PLATELET     IMAGES:   EKG: 04/01/2021 Rate 63 bpm  Sinus rhythm with 1st degree AV block with occasional PVCs RBBB  CV: Echo 12/12/2017 Study Conclusions   - Left ventricle: The cavity size was normal. Wall  thickness was    increased in a pattern of moderate LVH. Systolic function was    normal. The estimated ejection fraction was in the range of 55%    to 60%. Wall motion was normal; there were no regional wall    motion abnormalities. Doppler parameters are consistent with    abnormal left ventricular relaxation (grade 1 diastolic    dysfunction).   Stress Test 12/13/2017 There was no ST segment deviation noted during stress. T wave inversion was noted during stress in the III leads, beginning at 1 minutes of stress. T wave inversion persisted. Findings consistent with prior inferior myocardial infarction. Small mild distal anterior/apical infarct with mild peri-infarct ischemia. This is a low risk study. Mild area of current ischemia. The left ventricular ejection fraction is normal (55-65%). 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   Hyperlipidemia    Hypertension    Personal history of radiation therapy 2009    Past Surgical History:  Procedure Laterality Date   BREAST LUMPECTOMY Right 2009  BREAST SURGERY  2009   right lumpectomy   CERVIX SURGERY  1980   COLONOSCOPY     LEFT HEART CATHETERIZATION WITH CORONARY ANGIOGRAM N/A 04/20/2014   Procedure: LEFT HEART CATHETERIZATION WITH CORONARY ANGIOGRAM;  Surgeon: Jacolyn Reedy, MD;  Location: [facility] CATH LAB;  Service: Cardiovascular;  Laterality: N/A;   POLYPECTOMY     thyroid     for nodule hemithyroidectomy, benign   THYROIDECTOMY Right 03/2003    MEDICATIONS:  acetaminophen (TYLENOL) 500 MG tablet   amLODipine (NORVASC) 10 MG tablet   aspirin EC 81 MG tablet   Blood Glucose Monitoring Suppl MISC    Cholecalciferol (VITAMIN D3) 50 MCG (2000 UT) TABS   glucose blood (ONE TOUCH ULTRA TEST) test strip   HUMALOG KWIKPEN 100 UNIT/ML KiwkPen   hydrochlorothiazide (HYDRODIURIL) 12.5 MG tablet   Insulin Pen Needle (B-D UF III MINI PEN NEEDLES) 31G X 5 MM MISC   JARDIANCE 10 MG TABS tablet   LANTUS SOLOSTAR 100 UNIT/ML Solostar Pen   losartan (COZAAR) 50 MG tablet   metFORMIN (GLUCOPHAGE-XR) 500 MG 24 hr tablet   metoprolol succinate (TOPROL-XL) 100 MG 24 hr tablet   metoprolol succinate (TOPROL-XL) 50 MG 24 hr tablet   nitroGLYCERIN (NITROSTAT) 0.4 MG SL tablet   omeprazole (PRILOSEC) 40 MG capsule   OneTouch Delica Lancets 94V MISC   rosuvastatin (CRESTOR) 20 MG tablet   No current facility-administered medications for this encounter.   Konrad Felix, PA-C [facility] Pre-Surgical Testing [phone]

## 2021-04-30 ENCOUNTER — Encounter (HOSPITAL_COMMUNITY): Payer: Self-pay

## 2021-04-30 NOTE — Anesthesia Preprocedure Evaluation (Addendum)
Anesthesia Evaluation  Patient identified by MRN, date of birth, ID band Patient awake    Reviewed: Allergy & Precautions, NPO status , Patient's Chart, lab work & pertinent test results, reviewed documented beta blocker date and time   Airway Mallampati: II  TM Distance: >3 FB Neck ROM: Full    Dental  (+) Dental Advisory Given, Edentulous Upper, Edentulous Lower   Pulmonary neg pulmonary ROS,    Pulmonary exam normal breath sounds clear to auscultation       Cardiovascular hypertension, Pt. on medications and Pt. on home beta blockers (-) angina+ CAD, + Past MI and + Cardiac Stents  Normal cardiovascular exam Rhythm:Regular Rate:Normal     Neuro/Psych PSYCHIATRIC DISORDERS Depression  Neuromuscular disease    GI/Hepatic GERD  Medicated,SYMPTOMATIC CHOLELITHIASIS   Endo/Other  diabetes, Type 2, Oral Hypoglycemic Agents  Renal/GU negative Renal ROS     Musculoskeletal  (+) Arthritis ,   Abdominal   Peds  Hematology negative hematology ROS (+)   Anesthesia Other Findings   Reproductive/Obstetrics                           Anesthesia Physical Anesthesia Plan  ASA: 3  Anesthesia Plan: General   Post-op Pain Management:    Induction: Intravenous  PONV Risk Score and Plan: 4 or greater and Dexamethasone, Ondansetron and Treatment may vary due to age or medical condition  Airway Management Planned: Oral ETT  Additional Equipment:   Intra-op Plan:   Post-operative Plan: Extubation in OR  Informed Consent: I have reviewed the patients History and Physical, chart, labs and discussed the procedure including the risks, benefits and alternatives for the proposed anesthesia with the patient or authorized representative who has indicated his/her understanding and acceptance.     Dental advisory given  Plan Discussed with: CRNA  Anesthesia Plan Comments: (See PAT note 04/26/2021, Konrad Felix, PA-C)      Anesthesia Quick Evaluation

## 2021-04-30 NOTE — Progress Notes (Signed)
COVID Vaccine Completed: Date COVID Vaccine completed: COVID vaccine manufacturer: [place]   PCP - Dr. Gifford Shave. Cardiologist - Dr. Terrence Dupont. LOV: 12/11/20   Chest x-ray - 03/29/21 EKG - 12/30/20 Stress Test - ECHO - 12/12/17 Cardiac Cath - Pacemaker/ICD device last checked:   Sleep Study - CPAP -   Fasting Blood Sugar - 100's Checks Blood Sugar ___2__ times a day   Blood Thinner Instructions: Aspirin Instructions: Last Dose:   Anesthesia review: Hx: DIA,HTN,MI,CAD (stent)   Patient denies shortness of breath, fever, cough and chest pain at PAT appointment     Patient verbalized understanding of instructions that were given to them at the PAT appointment. Patient was also instructed that they will need to review over the PAT instructions again at home before surgery.     Electronically signed by Joline Maxcy, RN at 04/26/2021 11:56 AM  Electronically signed by Joline Maxcy, RN at 04/29/2021 10:55 AM  Pre-Admission Testing 60 on 04/26/2021  Pre-Admission Testing 60 on 04/26/2021   Revision History   Detailed Report  Note shared with patient    Additional Documentation  Vitals:  BP 127/56 Important  Pulse 52 Important  Temp 98 F (36.7 C) (Oral) Resp 18 Ht 5\' 1"  (1.549 m) Wt 58.1 kg SpO2 100% BMI 24.19 kg/m BSA 1.58 m  More Vitals  Flowsheets:  Vitals / Pain, Vital Signs, NEWS, MEWS Score, Anthropometrics, Legal Guardianship, OBSTRUCTIVE SLEEP APNEA, Psychosocial Review, Activities of Daily Living Screening, Pre-Admission Testing Checklist, PCR Screening, Infection/Precautions Assessment, Pre-op Order Status, Initial Suicide Risk, [place] Suicide Severity Rating Scale (C-SSRS Short Version), Discharge Planning .Marland Kitchen.(11 more)  Encounter Info:  Billing Info, History, Allergies, Detailed Report

## 2021-05-01 MED ORDER — SODIUM CHLORIDE 0.9 % IV SOLN
2.0000 g | INTRAVENOUS | Status: AC
  Administered 2021-05-02: 2 g via INTRAVENOUS
  Filled 2021-05-01: qty 2

## 2021-05-02 ENCOUNTER — Encounter (HOSPITAL_COMMUNITY)
Admission: RE | Disposition: A | Discharge status: Home / Self Care | Payer: Self-pay | Source: Home / Self Care | Attending: General Surgery

## 2021-05-02 ENCOUNTER — Ambulatory Visit (HOSPITAL_COMMUNITY): Payer: Medicare Other | Admitting: Anesthesiology

## 2021-05-02 ENCOUNTER — Encounter (HOSPITAL_COMMUNITY): Payer: Self-pay | Admitting: General Surgery

## 2021-05-02 ENCOUNTER — Ambulatory Visit (HOSPITAL_COMMUNITY)
Admission: RE | Admit: 2021-05-02 | Discharge: 2021-05-02 | Disposition: A | Discharge status: Home / Self Care | Payer: Medicare Other | Source: Home / Self Care | Attending: General Surgery | Admitting: General Surgery

## 2021-05-02 ENCOUNTER — Ambulatory Visit (HOSPITAL_COMMUNITY): Payer: Medicare Other | Admitting: Physician Assistant

## 2021-05-02 DIAGNOSIS — E890 Postprocedural hypothyroidism: Secondary | ICD-10-CM | POA: Diagnosis not present

## 2021-05-02 DIAGNOSIS — I214 Non-ST elevation (NSTEMI) myocardial infarction: Secondary | ICD-10-CM | POA: Diagnosis not present

## 2021-05-02 DIAGNOSIS — E43 Unspecified severe protein-calorie malnutrition: Secondary | ICD-10-CM | POA: Diagnosis not present

## 2021-05-02 DIAGNOSIS — K8010 Calculus of gallbladder with chronic cholecystitis without obstruction: Secondary | ICD-10-CM | POA: Insufficient documentation

## 2021-05-02 DIAGNOSIS — R64 Cachexia: Secondary | ICD-10-CM | POA: Diagnosis not present

## 2021-05-02 DIAGNOSIS — Z20822 Contact with and (suspected) exposure to covid-19: Secondary | ICD-10-CM | POA: Diagnosis not present

## 2021-05-02 DIAGNOSIS — J9851 Mediastinitis: Secondary | ICD-10-CM | POA: Diagnosis not present

## 2021-05-02 DIAGNOSIS — I2699 Other pulmonary embolism without acute cor pulmonale: Secondary | ICD-10-CM | POA: Diagnosis not present

## 2021-05-02 DIAGNOSIS — N179 Acute kidney failure, unspecified: Secondary | ICD-10-CM | POA: Diagnosis not present

## 2021-05-02 DIAGNOSIS — E1165 Type 2 diabetes mellitus with hyperglycemia: Secondary | ICD-10-CM | POA: Diagnosis not present

## 2021-05-02 DIAGNOSIS — D689 Coagulation defect, unspecified: Secondary | ICD-10-CM | POA: Diagnosis not present

## 2021-05-02 DIAGNOSIS — K660 Peritoneal adhesions (postprocedural) (postinfection): Secondary | ICD-10-CM | POA: Insufficient documentation

## 2021-05-02 DIAGNOSIS — Z853 Personal history of malignant neoplasm of breast: Secondary | ICD-10-CM | POA: Insufficient documentation

## 2021-05-02 DIAGNOSIS — I2510 Atherosclerotic heart disease of native coronary artery without angina pectoris: Secondary | ICD-10-CM | POA: Insufficient documentation

## 2021-05-02 DIAGNOSIS — K223 Perforation of esophagus: Secondary | ICD-10-CM | POA: Diagnosis not present

## 2021-05-02 DIAGNOSIS — E870 Hyperosmolality and hypernatremia: Secondary | ICD-10-CM | POA: Diagnosis not present

## 2021-05-02 DIAGNOSIS — E785 Hyperlipidemia, unspecified: Secondary | ICD-10-CM | POA: Diagnosis not present

## 2021-05-02 DIAGNOSIS — E11649 Type 2 diabetes mellitus with hypoglycemia without coma: Secondary | ICD-10-CM | POA: Diagnosis not present

## 2021-05-02 DIAGNOSIS — D6959 Other secondary thrombocytopenia: Secondary | ICD-10-CM | POA: Diagnosis not present

## 2021-05-02 DIAGNOSIS — I5023 Acute on chronic systolic (congestive) heart failure: Secondary | ICD-10-CM | POA: Diagnosis not present

## 2021-05-02 DIAGNOSIS — J948 Other specified pleural conditions: Secondary | ICD-10-CM | POA: Diagnosis not present

## 2021-05-02 DIAGNOSIS — I110 Hypertensive heart disease with heart failure: Secondary | ICD-10-CM | POA: Diagnosis not present

## 2021-05-02 DIAGNOSIS — J9601 Acute respiratory failure with hypoxia: Secondary | ICD-10-CM | POA: Diagnosis not present

## 2021-05-02 DIAGNOSIS — N170 Acute kidney failure with tubular necrosis: Secondary | ICD-10-CM | POA: Diagnosis not present

## 2021-05-02 DIAGNOSIS — Z8541 Personal history of malignant neoplasm of cervix uteri: Secondary | ICD-10-CM | POA: Insufficient documentation

## 2021-05-02 DIAGNOSIS — I10 Essential (primary) hypertension: Secondary | ICD-10-CM | POA: Diagnosis not present

## 2021-05-02 DIAGNOSIS — Z4682 Encounter for fitting and adjustment of non-vascular catheter: Secondary | ICD-10-CM | POA: Diagnosis not present

## 2021-05-02 DIAGNOSIS — R4182 Altered mental status, unspecified: Secondary | ICD-10-CM | POA: Diagnosis not present

## 2021-05-02 DIAGNOSIS — R6521 Severe sepsis with septic shock: Secondary | ICD-10-CM | POA: Diagnosis not present

## 2021-05-02 DIAGNOSIS — E874 Mixed disorder of acid-base balance: Secondary | ICD-10-CM | POA: Diagnosis not present

## 2021-05-02 DIAGNOSIS — J90 Pleural effusion, not elsewhere classified: Secondary | ICD-10-CM | POA: Diagnosis not present

## 2021-05-02 DIAGNOSIS — R001 Bradycardia, unspecified: Secondary | ICD-10-CM | POA: Diagnosis not present

## 2021-05-02 DIAGNOSIS — A408 Other streptococcal sepsis: Secondary | ICD-10-CM | POA: Diagnosis not present

## 2021-05-02 DIAGNOSIS — D62 Acute posthemorrhagic anemia: Secondary | ICD-10-CM | POA: Diagnosis not present

## 2021-05-02 DIAGNOSIS — K821 Hydrops of gallbladder: Secondary | ICD-10-CM | POA: Insufficient documentation

## 2021-05-02 DIAGNOSIS — J869 Pyothorax without fistula: Secondary | ICD-10-CM | POA: Diagnosis not present

## 2021-05-02 DIAGNOSIS — R0602 Shortness of breath: Secondary | ICD-10-CM | POA: Diagnosis not present

## 2021-05-02 HISTORY — PX: CHOLECYSTECTOMY: SHX55

## 2021-05-02 LAB — GLUCOSE, CAPILLARY
Glucose-Capillary: 70 mg/dL (ref 70–99)
Glucose-Capillary: 78 mg/dL (ref 70–99)

## 2021-05-02 SURGERY — LAPAROSCOPIC CHOLECYSTECTOMY WITH INTRAOPERATIVE CHOLANGIOGRAM
Anesthesia: General | Site: Abdomen

## 2021-05-02 MED ORDER — KETOROLAC TROMETHAMINE 30 MG/ML IJ SOLN
INTRAMUSCULAR | Status: DC | PRN
  Administered 2021-05-02: 15 mg via INTRAVENOUS

## 2021-05-02 MED ORDER — ONDANSETRON HCL 4 MG/2ML IJ SOLN
INTRAMUSCULAR | Status: DC | PRN
  Administered 2021-05-02: 4 mg via INTRAVENOUS

## 2021-05-02 MED ORDER — LIDOCAINE 2% (20 MG/ML) 5 ML SYRINGE
INTRAMUSCULAR | Status: AC
  Filled 2021-05-02: qty 5

## 2021-05-02 MED ORDER — ORAL CARE MOUTH RINSE: 15.0000 mL | Freq: Once | OROMUCOSAL | Status: AC

## 2021-05-02 MED ORDER — ACETAMINOPHEN 500 MG PO TABS
1000.0000 mg | ORAL_TABLET | ORAL | Status: AC
  Administered 2021-05-02: 1000 mg via ORAL
  Filled 2021-05-02: qty 2

## 2021-05-02 MED ORDER — TRAMADOL HCL 50 MG PO TABS: 50.0000 mg | ORAL_TABLET | Freq: Four times a day (QID) | ORAL | 0 refills | Status: DC | PRN

## 2021-05-02 MED ORDER — ACETAMINOPHEN 500 MG PO TABS: 1000.0000 mg | ORAL_TABLET | Freq: Three times a day (TID) | ORAL | 0 refills | Status: DC

## 2021-05-02 MED ORDER — CHLORHEXIDINE GLUCONATE CLOTH 2 % EX PADS: 6.0000 | MEDICATED_PAD | Freq: Once | CUTANEOUS | Status: DC

## 2021-05-02 MED ORDER — BUPIVACAINE-EPINEPHRINE 0.25% -1:200000 IJ SOLN
INTRAMUSCULAR | Status: DC | PRN
  Administered 2021-05-02: 20 mL

## 2021-05-02 MED ORDER — FENTANYL CITRATE (PF) 100 MCG/2ML IJ SOLN
INTRAMUSCULAR | Status: DC | PRN
  Administered 2021-05-02 (×2): 50 ug via INTRAVENOUS

## 2021-05-02 MED ORDER — FENTANYL CITRATE (PF) 100 MCG/2ML IJ SOLN
INTRAMUSCULAR | Status: AC
  Filled 2021-05-02: qty 2

## 2021-05-02 MED ORDER — ENSURE PRE-SURGERY PO LIQD
296.0000 mL | Freq: Once | ORAL | Status: DC
  Filled 2021-05-02: qty 296

## 2021-05-02 MED ORDER — FENTANYL CITRATE (PF) 100 MCG/2ML IJ SOLN: 25.0000 ug | INTRAMUSCULAR | Status: DC | PRN

## 2021-05-02 MED ORDER — EPHEDRINE SULFATE-NACL 50-0.9 MG/10ML-% IV SOSY
PREFILLED_SYRINGE | INTRAVENOUS | Status: DC | PRN
  Administered 2021-05-02: 5 mg via INTRAVENOUS

## 2021-05-02 MED ORDER — LACTATED RINGERS IV SOLN
INTRAVENOUS | Status: DC | PRN
  Administered 2021-05-02: 1000 mL

## 2021-05-02 MED ORDER — DEXAMETHASONE SODIUM PHOSPHATE 10 MG/ML IJ SOLN
INTRAMUSCULAR | Status: DC | PRN
  Administered 2021-05-02: 4 mg via INTRAVENOUS

## 2021-05-02 MED ORDER — PROPOFOL 10 MG/ML IV BOLUS
INTRAVENOUS | Status: DC | PRN
  Administered 2021-05-02: 120 mg via INTRAVENOUS

## 2021-05-02 MED ORDER — SUGAMMADEX SODIUM 200 MG/2ML IV SOLN
INTRAVENOUS | Status: DC | PRN
  Administered 2021-05-02: 200 mg via INTRAVENOUS

## 2021-05-02 MED ORDER — ONDANSETRON HCL 4 MG/2ML IJ SOLN
INTRAMUSCULAR | Status: AC
  Filled 2021-05-02: qty 2

## 2021-05-02 MED ORDER — ONDANSETRON HCL 4 MG/2ML IJ SOLN: 4.0000 mg | Freq: Once | INTRAMUSCULAR | Status: DC | PRN

## 2021-05-02 MED ORDER — LACTATED RINGERS IV SOLN: INTRAVENOUS | Status: DC

## 2021-05-02 MED ORDER — DEXAMETHASONE SODIUM PHOSPHATE 10 MG/ML IJ SOLN
INTRAMUSCULAR | Status: AC
  Filled 2021-05-02: qty 1

## 2021-05-02 MED ORDER — CHLORHEXIDINE GLUCONATE 0.12 % MT SOLN
15.0000 mL | Freq: Once | OROMUCOSAL | Status: AC
  Administered 2021-05-02: 15 mL via OROMUCOSAL

## 2021-05-02 MED ORDER — ROCURONIUM BROMIDE 10 MG/ML (PF) SYRINGE
PREFILLED_SYRINGE | INTRAVENOUS | Status: DC | PRN
  Administered 2021-05-02: 50 mg via INTRAVENOUS

## 2021-05-02 MED ORDER — PROPOFOL 10 MG/ML IV BOLUS
INTRAVENOUS | Status: AC
  Filled 2021-05-02: qty 20

## 2021-05-02 MED ORDER — LIDOCAINE 2% (20 MG/ML) 5 ML SYRINGE
INTRAMUSCULAR | Status: DC | PRN
  Administered 2021-05-02: 60 mg via INTRAVENOUS

## 2021-05-02 MED ORDER — BUPIVACAINE-EPINEPHRINE (PF) 0.25% -1:200000 IJ SOLN
INTRAMUSCULAR | Status: AC
  Filled 2021-05-02: qty 30

## 2021-05-02 MED ORDER — ROCURONIUM BROMIDE 10 MG/ML (PF) SYRINGE
PREFILLED_SYRINGE | INTRAVENOUS | Status: AC
  Filled 2021-05-02: qty 10

## 2021-05-02 SURGICAL SUPPLY — 60 items
ADH SKN CLS APL DERMABOND .7 (GAUZE/BANDAGES/DRESSINGS)
APL PRP STRL LF DISP 70% ISPRP (MISCELLANEOUS) ×1
APL SKNCLS STERI-STRIP NONHPOA (GAUZE/BANDAGES/DRESSINGS)
APL SRG 38 LTWT LNG FL B (MISCELLANEOUS)
APPLICATOR ARISTA FLEXITIP XL (MISCELLANEOUS) IMPLANT
APPLIER CLIP 5 13 M/L LIGAMAX5 (MISCELLANEOUS) ×3
APPLIER CLIP ROT 10 11.4 M/L (STAPLE)
APR CLP MED LRG 11.4X10 (STAPLE)
APR CLP MED LRG 5 ANG JAW (MISCELLANEOUS) ×1
BAG COUNTER SPONGE SURGICOUNT (BAG) IMPLANT
BAG SPEC RTRVL 10 TROC 200 (ENDOMECHANICALS) ×1
BAG SPNG CNTER NS LX DISP (BAG)
BAG SURGICOUNT SPONGE COUNTING (BAG)
BENZOIN TINCTURE PRP APPL 2/3 (GAUZE/BANDAGES/DRESSINGS) IMPLANT
BNDG ADH 1X3 SHEER STRL LF (GAUZE/BANDAGES/DRESSINGS) ×12 IMPLANT
BNDG ADH THN 3X1 STRL LF (GAUZE/BANDAGES/DRESSINGS) ×4
CABLE HIGH FREQUENCY MONO STRZ (ELECTRODE) ×3 IMPLANT
CHLORAPREP W/TINT 26 (MISCELLANEOUS) ×3 IMPLANT
CLIP APPLIE 5 13 M/L LIGAMAX5 (MISCELLANEOUS) ×1 IMPLANT
CLIP APPLIE ROT 10 11.4 M/L (STAPLE) IMPLANT
CLIP VESOLOCK MED LG 6/CT (CLIP) IMPLANT
CLOSURE WOUND 1/2 X4 (GAUZE/BANDAGES/DRESSINGS)
COVER MAYO STAND STRL (DRAPES) IMPLANT
COVER SURGICAL LIGHT HANDLE (MISCELLANEOUS) ×3 IMPLANT
DECANTER SPIKE VIAL GLASS SM (MISCELLANEOUS) ×3 IMPLANT
DERMABOND ADVANCED (GAUZE/BANDAGES/DRESSINGS)
DERMABOND ADVANCED .7 DNX12 (GAUZE/BANDAGES/DRESSINGS) IMPLANT
DRAPE C-ARM 42X120 X-RAY (DRAPES) IMPLANT
DRSG TEGADERM 2-3/8X2-3/4 SM (GAUZE/BANDAGES/DRESSINGS) IMPLANT
ELECT PENCIL ROCKER SW 15FT (MISCELLANEOUS) ×3 IMPLANT
ELECT REM PT RETURN 15FT ADLT (MISCELLANEOUS) ×3 IMPLANT
GAUZE SPONGE 2X2 8PLY STRL LF (GAUZE/BANDAGES/DRESSINGS) IMPLANT
GLOVE SURG POLY ORTHO LF SZ7.5 (GLOVE) ×3 IMPLANT
GOWN STRL REUS W/TWL XL LVL3 (GOWN DISPOSABLE) ×9 IMPLANT
GRASPER SUT TROCAR 14GX15 (MISCELLANEOUS) ×3 IMPLANT
HEMOSTAT ARISTA ABSORB 3G PWDR (HEMOSTASIS) IMPLANT
HEMOSTAT SNOW SURGICEL 2X4 (HEMOSTASIS) IMPLANT
KIT BASIN OR (CUSTOM PROCEDURE TRAY) ×3 IMPLANT
KIT TURNOVER KIT A (KITS) ×3 IMPLANT
L-HOOK LAP DISP 36CM (ELECTROSURGICAL)
LHOOK LAP DISP 36CM (ELECTROSURGICAL) IMPLANT
POUCH RETRIEVAL ECOSAC 10 (ENDOMECHANICALS) ×1 IMPLANT
POUCH RETRIEVAL ECOSAC 10MM (ENDOMECHANICALS) ×3
SCISSORS LAP 5X35 DISP (ENDOMECHANICALS) ×3 IMPLANT
SET CHOLANGIOGRAPH MIX (MISCELLANEOUS) IMPLANT
SET IRRIG TUBING LAPAROSCOPIC (IRRIGATION / IRRIGATOR) ×3 IMPLANT
SET TUBE SMOKE EVAC HIGH FLOW (TUBING) ×3 IMPLANT
SLEEVE XCEL OPT CAN 5 100 (ENDOMECHANICALS) ×6 IMPLANT
SPONGE GAUZE 2X2 STER 10/PKG (GAUZE/BANDAGES/DRESSINGS)
STRIP CLOSURE SKIN 1/2X4 (GAUZE/BANDAGES/DRESSINGS) IMPLANT
SUT MNCRL AB 4-0 PS2 18 (SUTURE) ×3 IMPLANT
SUT VIC AB 0 UR5 27 (SUTURE) IMPLANT
SUT VICRYL 0 TIES 12 18 (SUTURE) IMPLANT
SUT VICRYL 0 UR6 27IN ABS (SUTURE) ×9 IMPLANT
TOWEL OR 17X26 10 PK STRL BLUE (TOWEL DISPOSABLE) ×3 IMPLANT
TOWEL OR NON WOVEN STRL DISP B (DISPOSABLE) ×3 IMPLANT
TRAY LAPAROSCOPIC (CUSTOM PROCEDURE TRAY) ×3 IMPLANT
TROCAR BLADELESS OPT 5 100 (ENDOMECHANICALS) ×3 IMPLANT
TROCAR XCEL BLUNT TIP 100MML (ENDOMECHANICALS) ×3 IMPLANT
TROCAR XCEL NON-BLD 11X100MML (ENDOMECHANICALS) IMPLANT

## 2021-05-02 NOTE — Progress Notes (Signed)
Pt verbalized she felt jittery and dizzy this morning. Her CBG was 70 at home. She drank apple juice, she verbalizes this made her feel better. CBG on arrival was 70 but patient denies any symptoms. Educated to make nurse aware if she becomes symptomatic.

## 2021-05-02 NOTE — Transfer of Care (Signed)
Immediate Anesthesia Transfer of Care Note  Patient: Dana Coleman  Procedure(s) Performed: LAPAROSCOPIC CHOLECYSTECTOMY (Abdomen)  Patient Location: PACU  Anesthesia Type:General  Level of Consciousness: drowsy  Airway & Oxygen Therapy: Patient Spontanous Breathing and Patient connected to face mask oxygen  Post-op Assessment: Report given to RN and Post -op Vital signs reviewed and stable  Post vital signs: Reviewed and stable  Last Vitals:  Vitals Value Taken Time  BP 134/66 05/02/21 0945  Temp 36.4 C 05/02/21 0943  Pulse 53 05/02/21 0949  Resp 16 05/02/21 0949  SpO2 100 % 05/02/21 0949  Vitals shown include unvalidated device data.  Last Pain:  Vitals:   05/02/21 0617  PainSc: 0-No pain         Complications: No notable events documented.

## 2021-05-02 NOTE — Anesthesia Procedure Notes (Signed)
Procedure Name: Intubation Date/Time: 05/02/2021 8:21 AM Performed by: Niel Hummer, CRNA Pre-anesthesia Checklist: Patient identified, Emergency Drugs available, Suction available and Patient being monitored Patient Re-evaluated:Patient Re-evaluated prior to induction Oxygen Delivery Method: Circle system utilized Preoxygenation: Pre-oxygenation with 100% oxygen Induction Type: IV induction Laryngoscope Size: Mac and 4 Grade View: Grade II Tube type: Oral Tube size: 7.0 mm Number of attempts: 1 Airway Equipment and Method: Stylet Placement Confirmation: ETT inserted through vocal cords under direct vision, positive ETCO2 and breath sounds checked- equal and bilateral Secured at: 22 cm Tube secured with: Tape Dental Injury: Teeth and Oropharynx as per pre-operative assessment

## 2021-05-02 NOTE — Anesthesia Postprocedure Evaluation (Signed)
Anesthesia Post Note  Patient: Dilia W Ashworth  Procedure(s) Performed: LAPAROSCOPIC CHOLECYSTECTOMY (Abdomen)     Patient location during evaluation: PACU Anesthesia Type: General Level of consciousness: awake and alert Pain management: pain level controlled Vital Signs Assessment: post-procedure vital signs reviewed and stable Respiratory status: spontaneous breathing, nonlabored ventilation, respiratory function stable and patient connected to nasal cannula oxygen Cardiovascular status: blood pressure returned to baseline and stable Postop Assessment: no apparent nausea or vomiting Anesthetic complications: no   No notable events documented.  Last Vitals:  Vitals:   05/02/21 1015 05/02/21 1030  BP: 133/61 124/65  Pulse: (!) 50 (!) 51  Resp: 11   Temp: (!) 36.3 C   SpO2: 95% 95%    Last Pain:  Vitals:   05/02/21 1030  PainSc: 0-No pain                 Catalina Gravel

## 2021-05-02 NOTE — H&P (Signed)
CC: here for surgery  Requesting provider: n/a  HPI: Dana Coleman is an 80 y.o. female who is here for lap chole with poss ioc. Denies any medical changes since seen in clinic in may. 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   Hyperlipidemia    Hypertension    Personal history of radiation therapy 2009    Past Surgical History:  Procedure Laterality Date   BREAST LUMPECTOMY Right 2009   BREAST SURGERY  2009   right lumpectomy   [place] WITH CORONARY ANGIOGRAM N/A 04/20/2014   Procedure: LEFT HEART CATHETERIZATION WITH CORONARY ANGIOGRAM;  Surgeon: Jacolyn Reedy, MD;  Location: [facility] CATH LAB;  Service: Cardiovascular;  Laterality: N/A;   POLYPECTOMY     STENT PLACEMENT VASCULAR ([place] HX)     thyroid     for nodule hemithyroidectomy, benign   THYROIDECTOMY Right 03/2003    Family History  Problem Relation Age of Onset   Heart disease Mother        age 62   Heart disease Father    Cancer Father        unknown   Heart disease Brother    Hypertension Brother    Diabetes Daughter    Stroke Daughter    Cirrhosis Brother    Colon cancer Neg Hx    Colon polyps Neg Hx    Esophageal cancer Neg Hx    Stomach cancer Neg Hx    Rectal cancer Neg Hx     Social:  reports that she has never smoked. She has never used smokeless tobacco. She reports that she does not drink alcohol and does not use drugs.  Allergies: No Known Allergies  Medications: I have reviewed the patient's current medications.   ROS - all of the below systems have been reviewed with the patient  and positives are indicated with bold text General: chills, fever or night sweats Eyes: blurry vision or double vision ENT: epistaxis or sore throat Allergy/Immunology: itchy/watery eyes or nasal congestion Hematologic/Lymphatic: bleeding problems, blood clots or swollen lymph nodes Endocrine: temperature intolerance or unexpected weight changes Breast: Dana or changing breast lumps or nipple discharge Resp: cough, shortness of breath, or wheezing CV: chest pain or dyspnea on exertion GI: as per HPI GU: dysuria, trouble voiding, or hematuria MSK: joint pain or joint stiffness Neuro: TIA or stroke symptoms Derm: pruritus and skin lesion changes Psych: anxiety and depression  PE There were no vitals taken for this visit. Constitutional: NAD; conversant; no deformities Eyes: Moist conjunctiva; no lid lag; anicteric; PERRL Neck: Trachea midline; no thyromegaly Lungs: Normal respiratory effort; no tactile fremitus CV: RRR; no palpable thrills; no pitting edema GI: Abd soft, nt; no palpable hepatosplenomegaly MSK: Normal gait; no clubbing/cyanosis Psychiatric: Appropriate affect; alert and oriented x3 Lymphatic: No palpable cervical or axillary lymphadenopathy Skin:no rash, edema  Results for orders placed or performed during the hospital encounter of 05/02/21 (from the past 48 hour(s))  Glucose, capillary  Status: None   Collection Time: 05/02/21  6:13 AM  Result Value Ref Range   Glucose-Capillary 70 70 - 99 mg/dL    Comment: Glucose reference range applies only to samples taken after fasting for at least 8 hours.    No results found.  Imaging:   A/P: Dana Coleman is an 80 y.o. female with symptomatic cholelithiasis Cad  To OR for lap chole with poss ioc Iv abx All questions asked and answered  Leighton Ruff. Redmond Pulling, MD, FACS General, Bariatric, & Minimally Invasive Surgery [facility], [place]

## 2021-05-02 NOTE — Op Note (Signed)
Dana Coleman [id number] [birth year] 05/02/2021  Laparoscopic Cholecystectomy with IOC Procedure Note  Indications: This patient presents with symptomatic gallbladder disease and will undergo laparoscopic cholecystectomy.  Pre-operative Diagnosis: symptomatic cholelithiasis  Post-operative Diagnosis: chronic calculous cholecystitis, extensive Fitz-Hugh-Curtis adhesions  Surgeon: Greer Pickerel MD FACS  Assistants: Judyann Munson RNFA  Anesthesia: General endotracheal anesthesia    Procedure Details  The patient was seen again in the Holding Room. The risks, benefits, complications, treatment options, and expected outcomes were discussed with the patient. The possibilities of reaction to medication, pulmonary aspiration, perforation of viscus, bleeding, recurrent infection, finding a normal gallbladder, the need for additional procedures, failure to diagnose a condition, the possible need to convert to an open procedure, and creating a complication requiring transfusion or operation were discussed with the patient. The likelihood of improving the patient's symptoms with return to their baseline status is good.  The patient and/or family concurred with the proposed plan, giving informed consent. The site of surgery properly noted. The patient was taken to Operating Room, identified as Dana Coleman and the procedure verified as Laparoscopic Cholecystectomy with Intraoperative Cholangiogram. A Time Out was held and the above information confirmed. Antibiotic prophylaxis was administered.   Prior to the induction of general anesthesia, antibiotic prophylaxis was administered. General endotracheal anesthesia was then administered and tolerated well. After the induction, the abdomen was prepped with Chloraprep and draped in the sterile fashion. The patient was positioned in the supine position.  Local anesthetic agent was injected into the skin near the umbilicus and an incision made. We  dissected down to the abdominal fascia with blunt dissection.  The fascia was incised vertically and we entered the peritoneal cavity bluntly.  A pursestring suture of 0-Vicryl was placed around the fascial opening.  The patient's peritoneum was extremely attenuated.  The Hasson cannula was inserted and secured with the stay suture.  Pneumoperitoneum was then created with CO2 and tolerated well without any adverse changes in the patient's vital signs. An 5-mm port was placed in the subxiphoid position.  Two 5-mm ports were placed in the right upper quadrant. All skin incisions were infiltrated with a local anesthetic agent before making the incision and placing the trocars.   We positioned the patient in reverse Trendelenburg, tilted slightly to the patient's left.  The gallbladder was identified.  Patient had extreme Fitz-Hugh Curtis adhesions between the anterior surface of the right lobe of the liver and the abdominal wall.  The adhesions were so extreme that it was causing the edge of the entire right lobe of the liver to be densely adhered to the abdominal wall.  With these type of adhesions I was not going to be able to retract the gallbladder to the right shoulder.  I was ultimately able to identify a plane and take down the adhesions with hook electrocautery.  This took about 5 minutes to do.  The gallbladder was distended and in order to retract I aspirated the gallbladder.  There was clear fluid.  The fundus grasped and retracted cephalad. Adhesions were lysed bluntly and with the electrocautery where indicated, taking care not to injure any adjacent organs or viscus. The infundibulum was grasped and retracted laterally, exposing the peritoneum overlying the triangle of Calot. This was then divided and exposed in a blunt fashion. A critical view of the cystic duct and cystic artery was obtained.  The cystic duct was clearly identified and bluntly dissected circumferentially.   The cystic duct was then  ligated with clips  and divided. The cystic artery which had been identified & dissected free was ligated with clips and divided as well.   The gallbladder was dissected from the liver bed in retrograde fashion with the electrocautery. The gallbladder was removed and placed in an Ecco sac.  The gallbladder and Ecco sac were then removed through the umbilical port site. The liver bed was irrigated and inspected. Hemostasis was achieved with the electrocautery. Copious irrigation was utilized and was repeatedly aspirated until clear.  The pursestring suture was used to close the umbilical fascia.  As previously mentioned the patient had very attenuated fascia.  I placed 3 additional interrupted 0 Vicryl sutures using the PMI suture passer.  We again inspected the right upper quadrant for hemostasis.  The umbilical closure was inspected and there was no air leak and nothing trapped within the closure. Pneumoperitoneum was released as we removed the trocars.  4-0 Monocryl was used to close the skin.    steri-strips, and clean dressings were applied. The patient was then extubated and brought to the recovery room in stable condition. Instrument, sponge, and needle counts were correct at closure and at the conclusion of the case.   Findings: Extensive Fitz-Hugh Curtis adhesions Hydrops of the gallbladder Chronic cholecystitis with Cholelithiasis  Estimated Blood Loss: Minimal         Drains: none         Specimens: Gallbladder           Complications: None; patient tolerated the procedure well.         Disposition: PACU - hemodynamically stable.         Condition: stable  Leighton Ruff. Redmond Pulling, MD, FACS General, Bariatric, & Minimally Invasive Surgery [facility], [place]

## 2021-05-02 NOTE — Discharge Instructions (Signed)
[facility] LAPAROSCOPIC SURGERY: POST OP INSTRUCTIONS Always review your discharge instruction sheet given to you by the facility where your surgery was performed. IF YOU HAVE DISABILITY OR FAMILY LEAVE FORMS, YOU MUST BRING THEM TO THE OFFICE FOR PROCESSING.   DO NOT GIVE THEM TO YOUR DOCTOR.  PAIN CONTROL  First take acetaminophen (Tylenol) AND/or ibuprofen (Advil) to control your pain after surgery.  Follow directions on package.  Taking acetaminophen (Tylenol) and/or ibuprofen (Advil) regularly after surgery will help to control your pain and lower the amount of prescription pain medication you may need.  You should not take more than 3,000 mg (3 grams) of acetaminophen (Tylenol) in 24 hours.  You should not take ibuprofen (Advil), aleve, motrin, naprosyn or other NSAIDS if you have a history of stomach ulcers or chronic kidney disease.  A prescription for pain medication may be given to you upon discharge.  Take your pain medication as prescribed, if you still have uncontrolled pain after taking acetaminophen (Tylenol) or ibuprofen (Advil). Use ice packs to help control pain. If you need a refill on your pain medication, please contact your pharmacy.  They will contact our office to request authorization. Prescriptions will not be filled after 5pm or on week-ends.  HOME MEDICATIONS Take your usually prescribed medications unless otherwise directed.  DIET You should follow a light diet the first few days after arrival home.  Be sure to include lots of fluids daily. Avoid fatty, fried foods.   CONSTIPATION It is common to experience some constipation after surgery and if you are taking pain medication.  Increasing fluid intake and taking a stool softener (such as Colace) will usually help or prevent this problem from occurring.  A mild laxative (Milk of Magnesia or Miralax) should be taken according to package instructions if there are no bowel movements after 48  hours.  WOUND/INCISION CARE Most patients will experience some swelling and bruising in the area of the incisions.  Ice packs will help.  Swelling and bruising can take several days to resolve.  Unless discharge instructions indicate otherwise, follow guidelines below  STERI-STRIPS - you may remove your outer bandages 48 hours after surgery, and you may shower at that time.  You have steri-strips (small skin tapes) in place directly over the incision.  These strips should be left on the skin for 7-10 days.   DERMABOND/SKIN GLUE - you may shower in 24 hours.  The glue will flake off over the next 2-3 weeks. Any sutures or staples will be removed at the office during your follow-up visit.  ACTIVITIES You may resume regular (light) daily activities beginning the next day--such as daily self-care, walking, climbing stairs--gradually increasing activities as tolerated.  You may have sexual intercourse when it is comfortable.  Refrain from any heavy lifting or straining until approved by your doctor. You may drive when you are no longer taking prescription pain medication, you can comfortably wear a seatbelt, and you can safely maneuver your car and apply brakes.  FOLLOW-UP You should see your doctor in the office for a follow-up appointment approximately 2-3 weeks after your surgery.  You should have been given your post-op/follow-up appointment when your surgery was scheduled.  If you did not receive a post-op/follow-up appointment, make sure that you call for this appointment within a day or two after you arrive home to insure a convenient appointment time.  OTHER INSTRUCTIONS   WHEN TO CALL YOUR DOCTOR: Fever over 101.0 Inability to urinate Continued   bleeding from incision. Increased pain, redness, or drainage from the incision. Increasing abdominal pain  The clinic staff is available to answer your questions during regular business hours.  Please don't hesitate to call and ask to speak to  one of the nurses for clinical concerns.  If you have a medical emergency, go to the nearest emergency room or call 911.  A surgeon from [facility] is always on call at the hospital. [address], [address], [place], [place]  [address] ? [address] [address], [place], [place]   [address] [phone] ? [phone] ? FAX [phone] Web site: [email or web address]     Managing Your Pain After Surgery Without Opioids    Thank you for participating in our program to help patients manage their pain after surgery without opioids. This is part of our effort to provide you with the best care possible, without exposing you or your family to the risk that opioids pose.  What pain can I expect after surgery? You can expect to have some pain after surgery. This is normal. The pain is typically worse the day after surgery, and quickly begins to get better. Many studies have found that many patients are able to manage their pain after surgery with Over-the-Counter (OTC) medications such as Tylenol and Motrin. If you have a condition that does not allow you to take Tylenol or Motrin, notify your surgical team.  How will I manage my pain? The best strategy for controlling your pain after surgery is around the clock pain control with Tylenol (acetaminophen) and Motrin (ibuprofen or Advil). Alternating these medications with each other allows you to maximize your pain control. In addition to Tylenol and Motrin, you can use heating pads or ice packs on your incisions to help reduce your pain.  How will I alternate your regular strength over-the-counter pain medication? You will take a dose of pain medication every three hours. Start by taking 650 mg of Tylenol (2 pills of 325 mg) 3 hours later take 600 mg of Motrin (3 pills of 200 mg) 3 hours after taking the Motrin take 650 mg of Tylenol 3 hours after that take 600 mg of Motrin.   - 1 -  See example - if your first dose of  Tylenol is at 12:00 PM   12:00 PM Tylenol 650 mg (2 pills of 325 mg)  3:00 PM Motrin 600 mg (3 pills of 200 mg)  6:00 PM Tylenol 650 mg (2 pills of 325 mg)  9:00 PM Motrin 600 mg (3 pills of 200 mg)  Continue alternating every 3 hours   We recommend that you follow this schedule around-the-clock for at least 3 days after surgery, or until you feel that it is no longer needed. Use the table on the last page of this handout to keep track of the medications you are taking. Important: Do not take more than 3000mg of Tylenol or 1600mg of Motrin in a 24-hour period. Do not take ibuprofen/Motrin if you have a history of bleeding stomach ulcers, severe kidney disease, &/or actively taking a blood thinner  What if I still have pain? If you have pain that is not controlled with the over-the-counter pain medications (Tylenol and Motrin or Advil) you might have what we call "breakthrough" pain. You will receive a prescription for a small amount of an opioid pain medication such as Oxycodone, Tramadol, or Tylenol with Codeine. Use these opioid pills in the first 24 hours after surgery if you have breakthrough   pain. Do not take more than 1 pill every 4-6 hours.  If you still have uncontrolled pain after using all opioid pills, don't hesitate to call our staff using the number provided. We will help make sure you are managing your pain in the best way possible, and if necessary, we can provide a prescription for additional pain medication.   Day 1    Time  Name of Medication Number of pills taken  Amount of Acetaminophen  Pain Level   Comments  AM PM       AM PM       AM PM       AM PM       AM PM       AM PM       AM PM       AM PM       Total Daily amount of Acetaminophen Do not take more than  3,000 mg per day      Day 2    Time  Name of Medication Number of pills taken  Amount of Acetaminophen  Pain Level   Comments  AM PM       AM PM       AM PM       AM PM       AM PM        AM PM       AM PM       AM PM       Total Daily amount of Acetaminophen Do not take more than  3,000 mg per day      Day 3    Time  Name of Medication Number of pills taken  Amount of Acetaminophen  Pain Level   Comments  AM PM       AM PM       AM PM       AM PM          AM PM       AM PM       AM PM       AM PM       Total Daily amount of Acetaminophen Do not take more than  3,000 mg per day      Day 4    Time  Name of Medication Number of pills taken  Amount of Acetaminophen  Pain Level   Comments  AM PM       AM PM       AM PM       AM PM       AM PM       AM PM       AM PM       AM PM       Total Daily amount of Acetaminophen Do not take more than  3,000 mg per day      Day 5    Time  Name of Medication Number of pills taken  Amount of Acetaminophen  Pain Level   Comments  AM PM       AM PM       AM PM       AM PM       AM PM       AM PM       AM PM       AM PM       Total Daily amount of Acetaminophen Do not take more   than  3,000 mg per day       Day 6    Time  Name of Medication Number of pills taken  Amount of Acetaminophen  Pain Level  Comments  AM PM       AM PM       AM PM       AM PM       AM PM       AM PM       AM PM       AM PM       Total Daily amount of Acetaminophen Do not take more than  3,000 mg per day      Day 7    Time  Name of Medication Number of pills taken  Amount of Acetaminophen  Pain Level   Comments  AM PM       AM PM       AM PM       AM PM       AM PM       AM PM       AM PM       AM PM       Total Daily amount of Acetaminophen Do not take more than  3,000 mg per day        For additional information about how and where to safely dispose of unused opioid medications - https://www.morepowerfulnc.org  Disclaimer: This document contains information and/or instructional materials adapted from [facility] for the typical patient with your condition. It does not  replace medical advice from your health care provider because your experience may differ from that of the typical patient. Talk to your health care provider if you have any questions about this document, your condition or your treatment plan. Adapted from [facility]  

## 2021-05-03 ENCOUNTER — Encounter (HOSPITAL_COMMUNITY): Payer: Self-pay | Admitting: General Surgery

## 2021-05-03 LAB — SURGICAL PATHOLOGY

## 2021-05-04 ENCOUNTER — Inpatient Hospital Stay (HOSPITAL_COMMUNITY): Payer: Medicare Other

## 2021-05-04 ENCOUNTER — Emergency Department (HOSPITAL_COMMUNITY): Payer: Medicare Other

## 2021-05-04 ENCOUNTER — Inpatient Hospital Stay (HOSPITAL_COMMUNITY)
Admission: EM | Admit: 2021-05-04 | Discharge: 2021-06-17 | DRG: 853 | Disposition: A | Discharge status: Skilled Nursing Facility | Payer: Medicare Other | Attending: Family Medicine | Admitting: Family Medicine

## 2021-05-04 ENCOUNTER — Encounter (HOSPITAL_COMMUNITY): Payer: Self-pay | Admitting: Emergency Medicine

## 2021-05-04 DIAGNOSIS — R404 Transient alteration of awareness: Secondary | ICD-10-CM | POA: Diagnosis not present

## 2021-05-04 DIAGNOSIS — J9600 Acute respiratory failure, unspecified whether with hypoxia or hypercapnia: Secondary | ICD-10-CM | POA: Diagnosis not present

## 2021-05-04 DIAGNOSIS — I4891 Unspecified atrial fibrillation: Secondary | ICD-10-CM | POA: Diagnosis not present

## 2021-05-04 DIAGNOSIS — E1110 Type 2 diabetes mellitus with ketoacidosis without coma: Secondary | ICD-10-CM | POA: Diagnosis not present

## 2021-05-04 DIAGNOSIS — J9602 Acute respiratory failure with hypercapnia: Secondary | ICD-10-CM | POA: Diagnosis not present

## 2021-05-04 DIAGNOSIS — N170 Acute kidney failure with tubular necrosis: Secondary | ICD-10-CM | POA: Diagnosis not present

## 2021-05-04 DIAGNOSIS — E11649 Type 2 diabetes mellitus with hypoglycemia without coma: Secondary | ICD-10-CM | POA: Diagnosis not present

## 2021-05-04 DIAGNOSIS — Z8249 Family history of ischemic heart disease and other diseases of the circulatory system: Secondary | ICD-10-CM

## 2021-05-04 DIAGNOSIS — R531 Weakness: Secondary | ICD-10-CM | POA: Diagnosis not present

## 2021-05-04 DIAGNOSIS — E1165 Type 2 diabetes mellitus with hyperglycemia: Secondary | ICD-10-CM | POA: Diagnosis present

## 2021-05-04 DIAGNOSIS — I255 Ischemic cardiomyopathy: Secondary | ICD-10-CM | POA: Diagnosis not present

## 2021-05-04 DIAGNOSIS — I110 Hypertensive heart disease with heart failure: Secondary | ICD-10-CM | POA: Diagnosis present

## 2021-05-04 DIAGNOSIS — R54 Age-related physical debility: Secondary | ICD-10-CM | POA: Diagnosis present

## 2021-05-04 DIAGNOSIS — J95851 Ventilator associated pneumonia: Secondary | ICD-10-CM | POA: Diagnosis not present

## 2021-05-04 DIAGNOSIS — I252 Old myocardial infarction: Secondary | ICD-10-CM

## 2021-05-04 DIAGNOSIS — Z853 Personal history of malignant neoplasm of breast: Secondary | ICD-10-CM | POA: Diagnosis not present

## 2021-05-04 DIAGNOSIS — Z789 Other specified health status: Secondary | ICD-10-CM | POA: Diagnosis not present

## 2021-05-04 DIAGNOSIS — I214 Non-ST elevation (NSTEMI) myocardial infarction: Secondary | ICD-10-CM | POA: Diagnosis not present

## 2021-05-04 DIAGNOSIS — A408 Other streptococcal sepsis: Secondary | ICD-10-CM | POA: Diagnosis not present

## 2021-05-04 DIAGNOSIS — R0600 Dyspnea, unspecified: Secondary | ICD-10-CM | POA: Diagnosis not present

## 2021-05-04 DIAGNOSIS — K761 Chronic passive congestion of liver: Secondary | ICD-10-CM | POA: Diagnosis not present

## 2021-05-04 DIAGNOSIS — E876 Hypokalemia: Secondary | ICD-10-CM | POA: Diagnosis not present

## 2021-05-04 DIAGNOSIS — S2241XA Multiple fractures of ribs, right side, initial encounter for closed fracture: Secondary | ICD-10-CM | POA: Diagnosis not present

## 2021-05-04 DIAGNOSIS — E890 Postprocedural hypothyroidism: Secondary | ICD-10-CM | POA: Diagnosis present

## 2021-05-04 DIAGNOSIS — D62 Acute posthemorrhagic anemia: Secondary | ICD-10-CM | POA: Diagnosis present

## 2021-05-04 DIAGNOSIS — I4940 Unspecified premature depolarization: Secondary | ICD-10-CM | POA: Diagnosis not present

## 2021-05-04 DIAGNOSIS — Z452 Encounter for adjustment and management of vascular access device: Secondary | ICD-10-CM

## 2021-05-04 DIAGNOSIS — E875 Hyperkalemia: Secondary | ICD-10-CM | POA: Diagnosis not present

## 2021-05-04 DIAGNOSIS — K219 Gastro-esophageal reflux disease without esophagitis: Secondary | ICD-10-CM | POA: Diagnosis present

## 2021-05-04 DIAGNOSIS — J939 Pneumothorax, unspecified: Secondary | ICD-10-CM

## 2021-05-04 DIAGNOSIS — E874 Mixed disorder of acid-base balance: Secondary | ICD-10-CM | POA: Diagnosis present

## 2021-05-04 DIAGNOSIS — L8990 Pressure ulcer of unspecified site, unspecified stage: Secondary | ICD-10-CM | POA: Diagnosis present

## 2021-05-04 DIAGNOSIS — R0609 Other forms of dyspnea: Secondary | ICD-10-CM

## 2021-05-04 DIAGNOSIS — Z7982 Long term (current) use of aspirin: Secondary | ICD-10-CM

## 2021-05-04 DIAGNOSIS — R6521 Severe sepsis with septic shock: Secondary | ICD-10-CM | POA: Diagnosis not present

## 2021-05-04 DIAGNOSIS — R579 Shock, unspecified: Secondary | ICD-10-CM | POA: Diagnosis not present

## 2021-05-04 DIAGNOSIS — R911 Solitary pulmonary nodule: Secondary | ICD-10-CM | POA: Diagnosis present

## 2021-05-04 DIAGNOSIS — J9690 Respiratory failure, unspecified, unspecified whether with hypoxia or hypercapnia: Secondary | ICD-10-CM | POA: Diagnosis not present

## 2021-05-04 DIAGNOSIS — E43 Unspecified severe protein-calorie malnutrition: Secondary | ICD-10-CM | POA: Diagnosis present

## 2021-05-04 DIAGNOSIS — R109 Unspecified abdominal pain: Secondary | ICD-10-CM | POA: Diagnosis not present

## 2021-05-04 DIAGNOSIS — Z86010 Personal history of colonic polyps: Secondary | ICD-10-CM

## 2021-05-04 DIAGNOSIS — I2510 Atherosclerotic heart disease of native coronary artery without angina pectoris: Secondary | ICD-10-CM | POA: Diagnosis not present

## 2021-05-04 DIAGNOSIS — Z794 Long term (current) use of insulin: Secondary | ICD-10-CM

## 2021-05-04 DIAGNOSIS — J9851 Mediastinitis: Secondary | ICD-10-CM | POA: Diagnosis not present

## 2021-05-04 DIAGNOSIS — J869 Pyothorax without fistula: Secondary | ICD-10-CM | POA: Diagnosis not present

## 2021-05-04 DIAGNOSIS — S2231XA Fracture of one rib, right side, initial encounter for closed fracture: Secondary | ICD-10-CM | POA: Diagnosis not present

## 2021-05-04 DIAGNOSIS — K8010 Calculus of gallbladder with chronic cholecystitis without obstruction: Secondary | ICD-10-CM | POA: Diagnosis present

## 2021-05-04 DIAGNOSIS — I4892 Unspecified atrial flutter: Secondary | ICD-10-CM | POA: Diagnosis not present

## 2021-05-04 DIAGNOSIS — E46 Unspecified protein-calorie malnutrition: Secondary | ICD-10-CM | POA: Diagnosis not present

## 2021-05-04 DIAGNOSIS — Z86711 Personal history of pulmonary embolism: Secondary | ICD-10-CM

## 2021-05-04 DIAGNOSIS — Z809 Family history of malignant neoplasm, unspecified: Secondary | ICD-10-CM

## 2021-05-04 DIAGNOSIS — K222 Esophageal obstruction: Secondary | ICD-10-CM | POA: Diagnosis not present

## 2021-05-04 DIAGNOSIS — Z20822 Contact with and (suspected) exposure to covid-19: Secondary | ICD-10-CM | POA: Diagnosis not present

## 2021-05-04 DIAGNOSIS — Z95828 Presence of other vascular implants and grafts: Secondary | ICD-10-CM

## 2021-05-04 DIAGNOSIS — Z823 Family history of stroke: Secondary | ICD-10-CM

## 2021-05-04 DIAGNOSIS — R0689 Other abnormalities of breathing: Secondary | ICD-10-CM | POA: Diagnosis not present

## 2021-05-04 DIAGNOSIS — R64 Cachexia: Secondary | ICD-10-CM | POA: Diagnosis present

## 2021-05-04 DIAGNOSIS — N2889 Other specified disorders of kidney and ureter: Secondary | ICD-10-CM | POA: Diagnosis not present

## 2021-05-04 DIAGNOSIS — Z9689 Presence of other specified functional implants: Secondary | ICD-10-CM

## 2021-05-04 DIAGNOSIS — J90 Pleural effusion, not elsewhere classified: Secondary | ICD-10-CM | POA: Diagnosis not present

## 2021-05-04 DIAGNOSIS — Z923 Personal history of irradiation: Secondary | ICD-10-CM

## 2021-05-04 DIAGNOSIS — I452 Bifascicular block: Secondary | ICD-10-CM | POA: Diagnosis not present

## 2021-05-04 DIAGNOSIS — Z515 Encounter for palliative care: Secondary | ICD-10-CM | POA: Diagnosis not present

## 2021-05-04 DIAGNOSIS — J810 Acute pulmonary edema: Secondary | ICD-10-CM | POA: Diagnosis not present

## 2021-05-04 DIAGNOSIS — I2699 Other pulmonary embolism without acute cor pulmonale: Secondary | ICD-10-CM | POA: Diagnosis not present

## 2021-05-04 DIAGNOSIS — R4182 Altered mental status, unspecified: Secondary | ICD-10-CM | POA: Diagnosis present

## 2021-05-04 DIAGNOSIS — I700 Atherosclerosis of aorta: Secondary | ICD-10-CM | POA: Diagnosis not present

## 2021-05-04 DIAGNOSIS — K223 Perforation of esophagus: Secondary | ICD-10-CM | POA: Diagnosis not present

## 2021-05-04 DIAGNOSIS — I509 Heart failure, unspecified: Secondary | ICD-10-CM | POA: Diagnosis not present

## 2021-05-04 DIAGNOSIS — R0603 Acute respiratory distress: Secondary | ICD-10-CM

## 2021-05-04 DIAGNOSIS — R29898 Other symptoms and signs involving the musculoskeletal system: Secondary | ICD-10-CM | POA: Diagnosis not present

## 2021-05-04 DIAGNOSIS — R9431 Abnormal electrocardiogram [ECG] [EKG]: Secondary | ICD-10-CM | POA: Diagnosis not present

## 2021-05-04 DIAGNOSIS — R6889 Other general symptoms and signs: Secondary | ICD-10-CM | POA: Diagnosis not present

## 2021-05-04 DIAGNOSIS — Z8614 Personal history of Methicillin resistant Staphylococcus aureus infection: Secondary | ICD-10-CM

## 2021-05-04 DIAGNOSIS — J930 Spontaneous tension pneumothorax: Secondary | ICD-10-CM | POA: Diagnosis not present

## 2021-05-04 DIAGNOSIS — I5020 Unspecified systolic (congestive) heart failure: Secondary | ICD-10-CM | POA: Diagnosis not present

## 2021-05-04 DIAGNOSIS — Z9889 Other specified postprocedural states: Secondary | ICD-10-CM | POA: Diagnosis not present

## 2021-05-04 DIAGNOSIS — E119 Type 2 diabetes mellitus without complications: Secondary | ICD-10-CM | POA: Diagnosis not present

## 2021-05-04 DIAGNOSIS — J9601 Acute respiratory failure with hypoxia: Secondary | ICD-10-CM | POA: Diagnosis not present

## 2021-05-04 DIAGNOSIS — I440 Atrioventricular block, first degree: Secondary | ICD-10-CM | POA: Diagnosis not present

## 2021-05-04 DIAGNOSIS — E870 Hyperosmolality and hypernatremia: Secondary | ICD-10-CM | POA: Diagnosis not present

## 2021-05-04 DIAGNOSIS — R001 Bradycardia, unspecified: Secondary | ICD-10-CM | POA: Diagnosis not present

## 2021-05-04 DIAGNOSIS — J948 Other specified pleural conditions: Secondary | ICD-10-CM | POA: Diagnosis not present

## 2021-05-04 DIAGNOSIS — E8809 Other disorders of plasma-protein metabolism, not elsewhere classified: Secondary | ICD-10-CM | POA: Diagnosis not present

## 2021-05-04 DIAGNOSIS — I517 Cardiomegaly: Secondary | ICD-10-CM | POA: Diagnosis not present

## 2021-05-04 DIAGNOSIS — Z833 Family history of diabetes mellitus: Secondary | ICD-10-CM

## 2021-05-04 DIAGNOSIS — Z7409 Other reduced mobility: Secondary | ICD-10-CM | POA: Diagnosis not present

## 2021-05-04 DIAGNOSIS — Z7189 Other specified counseling: Secondary | ICD-10-CM | POA: Diagnosis not present

## 2021-05-04 DIAGNOSIS — Z6826 Body mass index (BMI) 26.0-26.9, adult: Secondary | ICD-10-CM

## 2021-05-04 DIAGNOSIS — E878 Other disorders of electrolyte and fluid balance, not elsewhere classified: Secondary | ICD-10-CM | POA: Diagnosis not present

## 2021-05-04 DIAGNOSIS — D689 Coagulation defect, unspecified: Secondary | ICD-10-CM | POA: Diagnosis not present

## 2021-05-04 DIAGNOSIS — J811 Chronic pulmonary edema: Secondary | ICD-10-CM | POA: Diagnosis not present

## 2021-05-04 DIAGNOSIS — K828 Other specified diseases of gallbladder: Secondary | ICD-10-CM | POA: Diagnosis present

## 2021-05-04 DIAGNOSIS — D6959 Other secondary thrombocytopenia: Secondary | ICD-10-CM | POA: Diagnosis present

## 2021-05-04 DIAGNOSIS — R0602 Shortness of breath: Secondary | ICD-10-CM

## 2021-05-04 DIAGNOSIS — I480 Paroxysmal atrial fibrillation: Secondary | ICD-10-CM | POA: Diagnosis not present

## 2021-05-04 DIAGNOSIS — Z743 Need for continuous supervision: Secondary | ICD-10-CM | POA: Diagnosis not present

## 2021-05-04 DIAGNOSIS — M8580 Other specified disorders of bone density and structure, unspecified site: Secondary | ICD-10-CM | POA: Diagnosis present

## 2021-05-04 DIAGNOSIS — Z8541 Personal history of malignant neoplasm of cervix uteri: Secondary | ICD-10-CM

## 2021-05-04 DIAGNOSIS — R5381 Other malaise: Secondary | ICD-10-CM | POA: Diagnosis present

## 2021-05-04 DIAGNOSIS — Z79899 Other long term (current) drug therapy: Secondary | ICD-10-CM

## 2021-05-04 DIAGNOSIS — R1310 Dysphagia, unspecified: Secondary | ICD-10-CM | POA: Diagnosis not present

## 2021-05-04 DIAGNOSIS — R140 Abdominal distension (gaseous): Secondary | ICD-10-CM | POA: Diagnosis not present

## 2021-05-04 DIAGNOSIS — G4700 Insomnia, unspecified: Secondary | ICD-10-CM | POA: Diagnosis not present

## 2021-05-04 DIAGNOSIS — M19042 Primary osteoarthritis, left hand: Secondary | ICD-10-CM | POA: Diagnosis present

## 2021-05-04 DIAGNOSIS — I10 Essential (primary) hypertension: Secondary | ICD-10-CM | POA: Diagnosis not present

## 2021-05-04 DIAGNOSIS — Z9049 Acquired absence of other specified parts of digestive tract: Secondary | ICD-10-CM

## 2021-05-04 DIAGNOSIS — I499 Cardiac arrhythmia, unspecified: Secondary | ICD-10-CM | POA: Diagnosis not present

## 2021-05-04 DIAGNOSIS — Z4682 Encounter for fitting and adjustment of non-vascular catheter: Secondary | ICD-10-CM

## 2021-05-04 DIAGNOSIS — I2694 Multiple subsegmental pulmonary emboli without acute cor pulmonale: Secondary | ICD-10-CM | POA: Diagnosis not present

## 2021-05-04 DIAGNOSIS — L89152 Pressure ulcer of sacral region, stage 2: Secondary | ICD-10-CM | POA: Diagnosis not present

## 2021-05-04 DIAGNOSIS — Z7984 Long term (current) use of oral hypoglycemic drugs: Secondary | ICD-10-CM

## 2021-05-04 DIAGNOSIS — Y848 Other medical procedures as the cause of abnormal reaction of the patient, or of later complication, without mention of misadventure at the time of the procedure: Secondary | ICD-10-CM | POA: Diagnosis not present

## 2021-05-04 DIAGNOSIS — J984 Other disorders of lung: Secondary | ICD-10-CM | POA: Diagnosis not present

## 2021-05-04 DIAGNOSIS — I5023 Acute on chronic systolic (congestive) heart failure: Secondary | ICD-10-CM | POA: Diagnosis not present

## 2021-05-04 DIAGNOSIS — Z8719 Personal history of other diseases of the digestive system: Secondary | ICD-10-CM | POA: Diagnosis not present

## 2021-05-04 DIAGNOSIS — Z955 Presence of coronary angioplasty implant and graft: Secondary | ICD-10-CM

## 2021-05-04 DIAGNOSIS — J9811 Atelectasis: Secondary | ICD-10-CM | POA: Diagnosis not present

## 2021-05-04 DIAGNOSIS — N179 Acute kidney failure, unspecified: Secondary | ICD-10-CM | POA: Diagnosis not present

## 2021-05-04 DIAGNOSIS — E785 Hyperlipidemia, unspecified: Secondary | ICD-10-CM | POA: Diagnosis present

## 2021-05-04 DIAGNOSIS — M19041 Primary osteoarthritis, right hand: Secondary | ICD-10-CM | POA: Diagnosis present

## 2021-05-04 HISTORY — DX: Respiratory failure, unspecified, unspecified whether with hypoxia or hypercapnia: J96.90

## 2021-05-04 LAB — CBC WITH DIFFERENTIAL/PLATELET
Abs Immature Granulocytes: 0.19 10*3/uL — ABNORMAL HIGH (ref 0.00–0.07)
Basophils Absolute: 0.1 10*3/uL (ref 0.0–0.1)
Basophils Relative: 1 %
Eosinophils Absolute: 0 10*3/uL (ref 0.0–0.5)
Eosinophils Relative: 0 %
HCT: 37.3 % (ref 36.0–46.0)
Hemoglobin: 10.8 g/dL — ABNORMAL LOW (ref 12.0–15.0)
Immature Granulocytes: 2 %
Lymphocytes Relative: 9 %
Lymphs Abs: 1.1 10*3/uL (ref 0.7–4.0)
MCH: 30.2 pg (ref 26.0–34.0)
MCHC: 29 g/dL — ABNORMAL LOW (ref 30.0–36.0)
MCV: 104.2 fL — ABNORMAL HIGH (ref 80.0–100.0)
Monocytes Absolute: 1.5 10*3/uL — ABNORMAL HIGH (ref 0.1–1.0)
Monocytes Relative: 12 %
Neutro Abs: 9.5 10*3/uL — ABNORMAL HIGH (ref 1.7–7.7)
Neutrophils Relative %: 76 %
Platelets: 168 10*3/uL (ref 150–400)
RBC: 3.58 MIL/uL — ABNORMAL LOW (ref 3.87–5.11)
RDW: 15.5 % (ref 11.5–15.5)
WBC: 12.3 10*3/uL — ABNORMAL HIGH (ref 4.0–10.5)
nRBC: 0.4 % — ABNORMAL HIGH (ref 0.0–0.2)

## 2021-05-04 LAB — COMPREHENSIVE METABOLIC PANEL
ALT: 97 U/L — ABNORMAL HIGH (ref 0–44)
AST: 138 U/L — ABNORMAL HIGH (ref 15–41)
Albumin: 2.1 g/dL — ABNORMAL LOW (ref 3.5–5.0)
Alkaline Phosphatase: 49 U/L (ref 38–126)
Anion gap: 26 — ABNORMAL HIGH (ref 5–15)
BUN: 38 mg/dL — ABNORMAL HIGH (ref 8–23)
CO2: 8 mmol/L — ABNORMAL LOW (ref 22–32)
Calcium: 10 mg/dL (ref 8.9–10.3)
Chloride: 106 mmol/L (ref 98–111)
Creatinine, Ser: 4.07 mg/dL — ABNORMAL HIGH (ref 0.44–1.00)
GFR, Estimated: 11 mL/min — ABNORMAL LOW (ref >60)
Glucose, Bld: 118 mg/dL — ABNORMAL HIGH (ref 70–99)
Potassium: 4.4 mmol/L (ref 3.5–5.1)
Sodium: 140 mmol/L (ref 135–145)
Total Bilirubin: 0.8 mg/dL (ref 0.3–1.2)
Total Protein: 5.2 g/dL — ABNORMAL LOW (ref 6.5–8.1)

## 2021-05-04 LAB — GLUCOSE, CAPILLARY
Glucose-Capillary: 109 mg/dL — ABNORMAL HIGH (ref 70–99)
Glucose-Capillary: 106 mg/dL — ABNORMAL HIGH (ref 70–99)
Glucose-Capillary: 127 mg/dL — ABNORMAL HIGH (ref 70–99)

## 2021-05-04 LAB — I-STAT VENOUS BLOOD GAS, ED
Acid-base deficit: 21 mmol/L — ABNORMAL HIGH (ref 0.0–2.0)
Bicarbonate: 7.6 mmol/L — ABNORMAL LOW (ref 20.0–28.0)
Calcium, Ion: 1.14 mmol/L — ABNORMAL LOW (ref 1.15–1.40)
HCT: 36 % (ref 36.0–46.0)
Hemoglobin: 12.2 g/dL (ref 12.0–15.0)
O2 Saturation: 97 %
Potassium: 8 mmol/L (ref 3.5–5.1)
Sodium: 132 mmol/L — ABNORMAL LOW (ref 135–145)
TCO2: 8 mmol/L — ABNORMAL LOW (ref 22–32)
pCO2, Ven: 26.1 mmHg — ABNORMAL LOW (ref 44.0–60.0)
pH, Ven: 7.072 — CL (ref 7.250–7.430)
pO2, Ven: 120 mmHg — ABNORMAL HIGH (ref 32.0–45.0)

## 2021-05-04 LAB — URINALYSIS, ROUTINE W REFLEX MICROSCOPIC
Bilirubin Urine: NEGATIVE
Glucose, UA: >=500 mg/dL — AB
Hgb urine dipstick: NEGATIVE
Ketones, ur: NEGATIVE mg/dL
Leukocytes,Ua: NEGATIVE
Nitrite: NEGATIVE
Protein, ur: NEGATIVE mg/dL
Specific Gravity, Urine: 1.024 (ref 1.005–1.030)
pH: 5 (ref 5.0–8.0)

## 2021-05-04 LAB — MRSA NEXT GEN BY PCR, NASAL: MRSA by PCR Next Gen: NOT DETECTED

## 2021-05-04 LAB — POCT I-STAT 7, (LYTES, BLD GAS, ICA,H+H)
Acid-base deficit: 15 mmol/L — ABNORMAL HIGH (ref 0.0–2.0)
Acid-base deficit: 14 mmol/L — ABNORMAL HIGH (ref 0.0–2.0)
Bicarbonate: 12.7 mmol/L — ABNORMAL LOW (ref 20.0–28.0)
Bicarbonate: 11.6 mmol/L — ABNORMAL LOW (ref 20.0–28.0)
Calcium, Ion: 1.28 mmol/L (ref 1.15–1.40)
Calcium, Ion: 1.2 mmol/L (ref 1.15–1.40)
HCT: 37 % (ref 36.0–46.0)
HCT: 35 % — ABNORMAL LOW (ref 36.0–46.0)
Hemoglobin: 12.6 g/dL (ref 12.0–15.0)
Hemoglobin: 11.9 g/dL — ABNORMAL LOW (ref 12.0–15.0)
O2 Saturation: 97 %
O2 Saturation: 93 %
Patient temperature: 35.2
Potassium: 4.5 mmol/L (ref 3.5–5.1)
Potassium: 4.4 mmol/L (ref 3.5–5.1)
Sodium: 139 mmol/L (ref 135–145)
Sodium: 139 mmol/L (ref 135–145)
TCO2: 14 mmol/L — ABNORMAL LOW (ref 22–32)
TCO2: 12 mmol/L — ABNORMAL LOW (ref 22–32)
pCO2 arterial: 34.2 mmHg (ref 32.0–48.0)
pCO2 arterial: 22.8 mmHg — ABNORMAL LOW (ref 32.0–48.0)
pH, Arterial: 7.176 — CL (ref 7.350–7.450)
pH, Arterial: 7.304 — ABNORMAL LOW (ref 7.350–7.450)
pO2, Arterial: 116 mmHg — ABNORMAL HIGH (ref 83.0–108.0)
pO2, Arterial: 66 mmHg — ABNORMAL LOW (ref 83.0–108.0)

## 2021-05-04 LAB — RESP PANEL BY RT-PCR (FLU A&B, COVID) ARPGX2
Influenza A by PCR: NEGATIVE
Influenza B by PCR: NEGATIVE
SARS Coronavirus 2 by RT PCR: NEGATIVE

## 2021-05-04 LAB — I-STAT CHEM 8, ED
BUN: 60 mg/dL — ABNORMAL HIGH (ref 8–23)
Calcium, Ion: 1.16 mmol/L (ref 1.15–1.40)
Chloride: 111 mmol/L (ref 98–111)
Creatinine, Ser: 4.4 mg/dL — ABNORMAL HIGH (ref 0.44–1.00)
Glucose, Bld: 102 mg/dL — ABNORMAL HIGH (ref 70–99)
HCT: 37 % (ref 36.0–46.0)
Hemoglobin: 12.6 g/dL (ref 12.0–15.0)
Potassium: 8 mmol/L (ref 3.5–5.1)
Sodium: 133 mmol/L — ABNORMAL LOW (ref 135–145)
TCO2: 10 mmol/L — ABNORMAL LOW (ref 22–32)

## 2021-05-04 LAB — BRAIN NATRIURETIC PEPTIDE: B Natriuretic Peptide: 2140.9 pg/mL — ABNORMAL HIGH (ref 0.0–100.0)

## 2021-05-04 LAB — LACTIC ACID, PLASMA
Lactic Acid, Venous: >11 mmol/L (ref 0.5–1.9)
Lactic Acid, Venous: >11 mmol/L (ref 0.5–1.9)
Lactic Acid, Venous: >11 mmol/L (ref 0.5–1.9)

## 2021-05-04 MED ORDER — LACTATED RINGERS IV SOLN: INTRAVENOUS | Status: DC

## 2021-05-04 MED ORDER — STERILE WATER FOR INJECTION IV SOLN
INTRAVENOUS | Status: DC
  Filled 2021-05-04 (×5): qty 150

## 2021-05-04 MED ORDER — INSULIN ASPART 100 UNIT/ML IJ SOLN
0.0000 [IU] | INTRAMUSCULAR | Status: DC
  Administered 2021-05-05: 2 [IU] via SUBCUTANEOUS

## 2021-05-04 MED ORDER — FENTANYL BOLUS VIA INFUSION
25.0000 ug | INTRAVENOUS | Status: DC | PRN
  Filled 2021-05-04: qty 100

## 2021-05-04 MED ORDER — SODIUM BICARBONATE 8.4 % IV SOLN: INTRAVENOUS | Status: DC

## 2021-05-04 MED ORDER — ROCURONIUM BROMIDE 50 MG/5ML IV SOLN
INTRAVENOUS | Status: AC | PRN
  Administered 2021-05-04: 70 mg via INTRAVENOUS

## 2021-05-04 MED ORDER — SODIUM CHLORIDE 0.9 % IV SOLN
INTRAVENOUS | Status: AC | PRN
  Administered 2021-05-04 (×2): 1000 mL via INTRAVENOUS

## 2021-05-04 MED ORDER — POLYETHYLENE GLYCOL 3350 17 G PO PACK: 17.0000 g | PACK | Freq: Every day | ORAL | Status: DC | PRN

## 2021-05-04 MED ORDER — ALTEPLASE 2 MG IJ SOLR: 2.0000 mg | Freq: Once | INTRAMUSCULAR | Status: DC | PRN

## 2021-05-04 MED ORDER — FENTANYL CITRATE (PF) 100 MCG/2ML IJ SOLN: 25.0000 ug | Freq: Once | INTRAMUSCULAR | Status: DC

## 2021-05-04 MED ORDER — HEPARIN SODIUM (PORCINE) 1000 UNIT/ML DIALYSIS
1000.0000 [IU] | INTRAMUSCULAR | Status: DC | PRN
  Administered 2021-05-05: 3000 [IU] via INTRAVENOUS_CENTRAL
  Filled 2021-05-04 (×3): qty 6
  Filled 2021-05-04: qty 3

## 2021-05-04 MED ORDER — IOHEXOL 9 MG/ML PO SOLN
ORAL | Status: AC
  Filled 2021-05-04: qty 500

## 2021-05-04 MED ORDER — STERILE WATER FOR INJECTION IV SOLN
INTRAVENOUS | Status: DC
  Filled 2021-05-04 (×3): qty 150

## 2021-05-04 MED ORDER — ORAL CARE MOUTH RINSE
15.0000 mL | OROMUCOSAL | Status: DC
  Administered 2021-05-05 (×7): 15 mL via OROMUCOSAL

## 2021-05-04 MED ORDER — SODIUM CHLORIDE 0.9 % FOR CRRT: INTRAVENOUS_CENTRAL | Status: DC | PRN

## 2021-05-04 MED ORDER — CALCIUM GLUCONATE 10 % IV SOLN
1.0000 g | Freq: Once | INTRAVENOUS | Status: AC
  Administered 2021-05-04: 1 g via INTRAVENOUS
  Filled 2021-05-04: qty 10

## 2021-05-04 MED ORDER — DOCUSATE SODIUM 50 MG/5ML PO LIQD: 100.0000 mg | Freq: Two times a day (BID) | ORAL | Status: DC | PRN

## 2021-05-04 MED ORDER — SODIUM CHLORIDE 0.9 % IV SOLN
INTRAVENOUS | Status: DC | PRN
  Administered 2021-05-04: 500 mL via INTRAVENOUS

## 2021-05-04 MED ORDER — PANTOPRAZOLE SODIUM 40 MG IV SOLR
40.0000 mg | Freq: Every day | INTRAVENOUS | Status: DC
  Administered 2021-05-04 – 2021-05-26 (×23): 40 mg via INTRAVENOUS
  Filled 2021-05-04 (×23): qty 40

## 2021-05-04 MED ORDER — SODIUM BICARBONATE 8.4 % IV SOLN
INTRAVENOUS | Status: AC
  Administered 2021-05-04: 50 meq
  Filled 2021-05-04: qty 50

## 2021-05-04 MED ORDER — SODIUM BICARBONATE 8.4 % IV SOLN
50.0000 meq | Freq: Once | INTRAVENOUS | Status: AC
  Administered 2021-05-04: 50 meq via INTRAVENOUS

## 2021-05-04 MED ORDER — NOREPINEPHRINE 4 MG/250ML-% IV SOLN
INTRAVENOUS | Status: AC
  Administered 2021-05-04: 20 ug/min via INTRAVENOUS
  Filled 2021-05-04: qty 250

## 2021-05-04 MED ORDER — SODIUM BICARBONATE 8.4 % IV SOLN
INTRAVENOUS | Status: AC | PRN
  Administered 2021-05-04: 50 meq via INTRAVENOUS

## 2021-05-04 MED ORDER — ALBUTEROL SULFATE (2.5 MG/3ML) 0.083% IN NEBU: 10.0000 mg | INHALATION_SOLUTION | Freq: Once | RESPIRATORY_TRACT | Status: DC

## 2021-05-04 MED ORDER — STERILE WATER FOR INJECTION IV SOLN
Freq: Once | INTRAVENOUS | Status: AC
  Filled 2021-05-04: qty 1000

## 2021-05-04 MED ORDER — LACTATED RINGERS IV BOLUS
2000.0000 mL | Freq: Once | INTRAVENOUS | Status: AC
  Administered 2021-05-04: 2000 mL via INTRAVENOUS

## 2021-05-04 MED ORDER — PRISMASOL BGK 4/2.5 32-4-2.5 MEQ/L EC SOLN
Status: DC
  Filled 2021-05-04 (×12): qty 5000

## 2021-05-04 MED ORDER — CALCIUM CHLORIDE 10 % IV SOLN
INTRAVENOUS | Status: AC | PRN
  Administered 2021-05-04: 1 g via INTRAVENOUS

## 2021-05-04 MED ORDER — PIPERACILLIN-TAZOBACTAM 3.375 G IVPB 30 MIN
3.3750 g | Freq: Four times a day (QID) | INTRAVENOUS | Status: DC
  Administered 2021-05-05 (×4): 3.375 g via INTRAVENOUS
  Filled 2021-05-04 (×8): qty 50

## 2021-05-04 MED ORDER — NOREPINEPHRINE 4 MG/250ML-% IV SOLN
0.0000 ug/min | INTRAVENOUS | Status: DC
  Administered 2021-05-04: 10 ug/min via INTRAVENOUS
  Administered 2021-05-05: 12 ug/min via INTRAVENOUS
  Administered 2021-05-05: 7 ug/min via INTRAVENOUS
  Administered 2021-05-05: 5 ug/min via INTRAVENOUS
  Administered 2021-05-06: 18 ug/min via INTRAVENOUS
  Administered 2021-05-06: 8 ug/min via INTRAVENOUS
  Filled 2021-05-04 (×5): qty 250

## 2021-05-04 MED ORDER — FENTANYL 2500MCG IN NS 250ML (10MCG/ML) PREMIX INFUSION
25.0000 ug/h | INTRAVENOUS | Status: DC
  Administered 2021-05-04: 50 ug/h via INTRAVENOUS
  Administered 2021-05-06: 150 ug/h via INTRAVENOUS
  Filled 2021-05-04 (×2): qty 250

## 2021-05-04 MED ORDER — CHLORHEXIDINE GLUCONATE 0.12% ORAL RINSE (MEDLINE KIT)
15.0000 mL | Freq: Two times a day (BID) | OROMUCOSAL | Status: DC
  Administered 2021-05-04 – 2021-05-05 (×2): 15 mL via OROMUCOSAL

## 2021-05-04 MED ORDER — PIPERACILLIN-TAZOBACTAM 3.375 G IVPB: 3.3750 g | Freq: Two times a day (BID) | INTRAVENOUS | Status: DC

## 2021-05-04 MED ORDER — PIPERACILLIN-TAZOBACTAM 3.375 G IVPB 30 MIN
3.3750 g | INTRAVENOUS | Status: AC
  Administered 2021-05-04: 3.375 g via INTRAVENOUS
  Filled 2021-05-04: qty 50

## 2021-05-04 MED ORDER — ETOMIDATE 2 MG/ML IV SOLN
INTRAVENOUS | Status: AC | PRN
  Administered 2021-05-04: 20 mg via INTRAVENOUS

## 2021-05-04 NOTE — Progress Notes (Signed)
Called and updated Hassan Rowan to the best of my ability,  - Start zosyn - 2L LR, increase RR on vent, trend lactate, start bicarb drip, repeat abg around 8pm  Appreciate [facility] help figuring out where this hydropneumothorax came from.  Erskine Emery MD PCCM

## 2021-05-04 NOTE — Consult Note (Signed)
Consulting Physician: Nickola Major Joslin Doell  Referring Provider: Erskine Emery, MD (Critical care)  Chief Complaint: Shortness of breath  Reason for Consult: Shortness of breath 2 days after cholecystectomy   Subjective   HPI: Dana Coleman is an 80 y.o. female who is here for shorntess of breath.  She had a laparoscopic cholecystectomy which was relatively straightforward per the record.  Entry was with hasson access, findings included extensive Fitz-Hugh-Curtis type adhesions between the liver and the diaphragm.  She had worsening abdominal pain, then presents today with shortness of breath.  She was intubated and sedated.  I placed a pigtail catheter in the trauma bay in the ER.  The drainage from the pigtail catheter appears similar to the NG drainage.  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   Hyperlipidemia    Hypertension    Personal history of radiation therapy 2009    Past Surgical History:  Procedure Laterality Date   BREAST LUMPECTOMY Right 2009   BREAST SURGERY  2009   right lumpectomy   [place] N/A 05/02/2021   Procedure: LAPAROSCOPIC CHOLECYSTECTOMY;  Surgeon: Greer Pickerel, MD;  Location: [facility];  Service: General;  Laterality: N/A;   COLONOSCOPY     LEFT HEART CATHETERIZATION WITH CORONARY ANGIOGRAM N/A 04/20/2014   Procedure: LEFT HEART CATHETERIZATION WITH CORONARY ANGIOGRAM;  Surgeon: Jacolyn Reedy, MD;  Location: [facility] CATH LAB;  Service: Cardiovascular;  Laterality: N/A;   POLYPECTOMY     STENT PLACEMENT VASCULAR ([place] HX)     thyroid     for nodule hemithyroidectomy, benign   THYROIDECTOMY Right  03/2003    Family History  Problem Relation Age of Onset   Heart disease Mother        age 20   Heart disease Father    Cancer Father        unknown   Heart disease Brother    Hypertension Brother    Diabetes Daughter    Stroke Daughter    Cirrhosis Brother    Colon cancer Neg Hx    Colon polyps Neg Hx    Esophageal cancer Neg Hx    Stomach cancer Neg Hx    Rectal cancer Neg Hx     Social:  reports that she has never smoked. She has never used smokeless tobacco. She reports that she does not drink alcohol and does not use drugs.  Allergies: No Known Allergies  Medications: Current Outpatient Medications  Medication Instructions   acetaminophen (TYLENOL) 1,000 mg, Oral, Every 8 hours   amLODipine (NORVASC) 10 MG tablet TAKE 1 TABLET BY MOUTH EVERY DAY   aspirin EC 81 mg, Oral, Every morning, Swallow whole.   Blood Glucose Monitoring Suppl MISC One touch ultra glucose monitor. Check blood sugar twice a day.   glucose blood (ONE TOUCH ULTRA TEST) test strip CHECK BLOOD SUGAR TWICE DAILY   hydrochlorothiazide (HYDRODIURIL) 12.5 MG tablet TAKE 1 TABLET BY MOUTH EVERY DAY   Insulin Pen Needle (B-D UF III MINI PEN NEEDLES) 31G X  5 MM MISC INJECT INSULIN VIA PEN 6 TIMES DAILY   JARDIANCE 10 MG TABS tablet TAKE 1 TABLET BY MOUTH EVERY DAY   LANTUS SOLOSTAR 100 UNIT/ML Solostar Pen INJECT 24 UNITS INTO THE SKIN EVERY MORNING.   losartan (COZAAR) 50 mg, Oral, Every morning   metFORMIN (GLUCOPHAGE-XR) 500 MG 24 hr tablet TAKE 1 TABLET BY MOUTH TWICE A DAY WITH BREAKFAST AND DINNER   nitroGLYCERIN (NITROSTAT) 0.4 mg, Sublingual, Every 5 min PRN   omeprazole (PRILOSEC) 40 mg, Oral, Every morning   OneTouch Delica Lancets 99991111 MISC 1 Container, Does not apply, As needed   rosuvastatin (CRESTOR) 20 MG tablet TAKE 1 TABLET BY MOUTH EVERYDAY AT BEDTIME   traMADol (ULTRAM) 50 mg, Oral, Every 6 hours PRN   Vitamin D3 2,000 Units, Oral, Every morning    ROS - all of the below systems have  been reviewed with the patient and positives are indicated with bold text General: chills, fever or night sweats Eyes: blurry vision or double vision ENT: epistaxis or sore throat Allergy/Immunology: itchy/watery eyes or nasal congestion Hematologic/Lymphatic: bleeding problems, blood clots or swollen lymph nodes Endocrine: temperature intolerance or unexpected weight changes Breast: new or changing breast lumps or nipple discharge Resp: cough, shortness of breath, or wheezing CV: chest pain or dyspnea on exertion GI: as per HPI GU: dysuria, trouble voiding, or hematuria MSK: joint pain or joint stiffness Neuro: TIA or stroke symptoms Derm: pruritus and skin lesion changes Psych: anxiety and depression  Objective   PE Blood pressure 119/62, pulse 71, temperature (!) 96 F (35.6 C), temperature source Rectal, resp. rate 16, height '5\' 1"'$  (1.549 m), weight 58 kg, SpO2 99 %. Constitutional: Intubated, sedated Eyes: closed, PERRL Neck: Trachea midline; no thyromegaly Lungs: decreased breath sounds on right, ventilated CV: Bradycardic prior to chest tube placement, improvement with pleural effusion drainage GI: Abd Soft, incisions c/d/I w/ bandaids; no palpable hepatosplenomegaly MSK: Sedated, warm, well perfused Psychiatric: Sedated Lymphatic: No palpable cervical or axillary lymphadenopathy  Results for orders placed or performed during the hospital encounter of 05/04/21 (from the past 24 hour(s))  CBC with Differential     Status: Abnormal (Preliminary result)   Collection Time: 05/04/21  4:32 PM  Result Value Ref Range   WBC 12.3 (H) 4.0 - 10.5 K/uL   RBC 3.58 (L) 3.87 - 5.11 MIL/uL   Hemoglobin 10.8 (L) 12.0 - 15.0 g/dL   HCT 37.3 36.0 - 46.0 %   MCV 104.2 (H) 80.0 - 100.0 fL   MCH 30.2 26.0 - 34.0 pg   MCHC 29.0 (L) 30.0 - 36.0 g/dL   RDW 15.5 11.5 - 15.5 %   Platelets 168 150 - 400 K/uL   nRBC 0.4 (H) 0.0 - 0.2 %   Neutrophils Relative % PENDING %   Neutro Abs PENDING  1.7 - 7.7 K/uL   Band Neutrophils PENDING %   Lymphocytes Relative PENDING %   Lymphs Abs PENDING 0.7 - 4.0 K/uL   Monocytes Relative PENDING %   Monocytes Absolute PENDING 0.1 - 1.0 K/uL   Eosinophils Relative PENDING %   Eosinophils Absolute PENDING 0.0 - 0.5 K/uL   Basophils Relative PENDING %   Basophils Absolute PENDING 0.0 - 0.1 K/uL   WBC Morphology PENDING    RBC Morphology PENDING    Smear Review PENDING    Other PENDING %   nRBC PENDING 0 /100 WBC   Metamyelocytes Relative PENDING %   Myelocytes PENDING %   Promyelocytes Relative  PENDING %   Blasts PENDING %   Immature Granulocytes PENDING %   Abs Immature Granulocytes PENDING 0.00 - 0.07 K/uL  Brain natriuretic peptide     Status: Abnormal   Collection Time: 05/04/21  4:32 PM  Result Value Ref Range   B Natriuretic Peptide 2,140.9 (H) 0.0 - 100.0 pg/mL  Urinalysis, Routine w reflex microscopic     Status: Abnormal   Collection Time: 05/04/21  4:37 PM  Result Value Ref Range   Color, Urine AMBER (A) YELLOW   APPearance CLOUDY (A) CLEAR   Specific Gravity, Urine 1.024 1.005 - 1.030   pH 5.0 5.0 - 8.0   Glucose, UA >=500 (A) NEGATIVE mg/dL   Hgb urine dipstick NEGATIVE NEGATIVE   Bilirubin Urine NEGATIVE NEGATIVE   Ketones, ur NEGATIVE NEGATIVE mg/dL   Protein, ur NEGATIVE NEGATIVE mg/dL   Nitrite NEGATIVE NEGATIVE   Leukocytes,Ua NEGATIVE NEGATIVE   RBC / HPF 0-5 0 - 5 RBC/hpf   WBC, UA 6-10 0 - 5 WBC/hpf   Bacteria, UA RARE (A) NONE SEEN   Squamous Epithelial / LPF 0-5 0 - 5   Mucus PRESENT    Hyaline Casts, UA PRESENT    Non Squamous Epithelial 0-5 (A) NONE SEEN  I-Stat venous blood gas, ED     Status: Abnormal   Collection Time: 05/04/21  4:43 PM  Result Value Ref Range   pH, Ven 7.072 (LL) 7.250 - 7.430   pCO2, Ven 26.1 (L) 44.0 - 60.0 mmHg   pO2, Ven 120.0 (H) 32.0 - 45.0 mmHg   Bicarbonate 7.6 (L) 20.0 - 28.0 mmol/L   TCO2 8 (L) 22 - 32 mmol/L   O2 Saturation 97.0 %   Acid-base deficit 21.0 (H)  0.0 - 2.0 mmol/L   Sodium 132 (L) 135 - 145 mmol/L   Potassium 8.0 (HH) 3.5 - 5.1 mmol/L   Calcium, Ion 1.14 (L) 1.15 - 1.40 mmol/L   HCT 36.0 36.0 - 46.0 %   Hemoglobin 12.2 12.0 - 15.0 g/dL   Sample type VENOUS    Comment NOTIFIED PHYSICIAN   I-stat chem 8, ED (not at [facility] or [facility])     Status: Abnormal   Collection Time: 05/04/21  4:44 PM  Result Value Ref Range   Sodium 133 (L) 135 - 145 mmol/L   Potassium 8.0 (HH) 3.5 - 5.1 mmol/L   Chloride 111 98 - 111 mmol/L   BUN 60 (H) 8 - 23 mg/dL   Creatinine, Ser 4.40 (H) 0.44 - 1.00 mg/dL   Glucose, Bld 102 (H) 70 - 99 mg/dL   Calcium, Ion 1.16 1.15 - 1.40 mmol/L   TCO2 10 (L) 22 - 32 mmol/L   Hemoglobin 12.6 12.0 - 15.0 g/dL   HCT 37.0 36.0 - 46.0 %   Comment NOTIFIED PHYSICIAN      Imaging Orders  DG Chest Port 1 View  CT HEAD WO CONTRAST  CT Chest Wo Contrast  CT ABDOMEN PELVIS WO CONTRAST    Assessment and Plan   [place] Icenhower is an 80 y.o. female with shortness of breath two days after cholecystectomy.  Her current issues include: Right hydropneumothorax - culture fluid, chest catheter to wall suction Severe metabolic acidosis - Resuscitation guided by CCM, appreciate assistance Bradycardia - improved with thoracostomy catheter placement Shock - unknown cause, on pressor support  No evidence of surgical complication on CT, not sure what explains her presentation, will follow with critical care team     Nickola Major  Yates Weisgerber, [facility] Use [email or web address] to contact on call provider

## 2021-05-04 NOTE — ED Notes (Signed)
Daughter Hassan Rowan [phone] would like an update

## 2021-05-04 NOTE — Progress Notes (Signed)
Pharmacy Antibiotic Note  Dana Coleman is a 80 y.o. female admitted on 05/04/2021, now starting CRRT.  Pharmacy has been consulted for Zosyn dosing.  Plan: Change Zosyn to 3.375g IV Q6H (30-min infusion).  Height: '5\' 1"'$  (154.9 cm) Weight: 58 kg (127 lb 13.9 oz) IBW/kg (Calculated) : 47.8  Temp (24hrs), Avg:95.7 F (35.4 C), Min:95.18 F (35.1 C), Max:96.44 F (35.8 C)  Recent Labs  Lab 05/04/21 1632 05/04/21 1637 05/04/21 1644 05/04/21 1912 05/04/21 2155  WBC 12.3*  --   --   --   --   CREATININE 4.07*  --  4.40*  --   --   LATICACIDVEN  --  >11.0*  --  >11.0* >11.0*    Estimated Creatinine Clearance: 8.5 mL/min (A) (by C-G formula based on SCr of 4.4 mg/dL (H)).    No Known Allergies   Thank you for allowing pharmacy to be a part of this patient's care.  Wynona Neat, PharmD, BCPS  05/04/2021 11:33 PM

## 2021-05-04 NOTE — H&P (Addendum)
NAME:  Dana Coleman, MRN:  [id number], DOB:  May 21, 1941, LOS: 0 ADMISSION DATE:  05/04/2021, CONSULTATION DATE:  05/04/21 REFERRING MD:  Ralene Bathe, CHIEF COMPLAINT:  SOB  History of Present Illness:  Post op GB surgery Worsened abd post post Brought in by EMS, began becoming brady, hypoxemic to 70s, intubated CXR with large loculated R effusion CTA ordered Labs pending PCCM consulted for admission Patient currently intubated and sedated  Pertinent  Medical History  Prior R breast cancer DM CAD HLD HTN  Significant Hospital Events: Including procedures, antibiotic start and stop dates in addition to other pertinent events   7/23 admitted intubated  Interim History / Subjective:  Consulted  Objective   Blood pressure 124/79, pulse 87, temperature (!) 96 F (35.6 C), temperature source Rectal, resp. rate (!) 22, height '5\' 1"'$  (1.549 m), weight 58 kg, SpO2 (!) 72 %.    Vent Mode: PRVC FiO2 (%):  [100 %] 100 % Set Rate:  [16 bmp] 16 bmp Vt Set:  [450 mL] 450 mL PEEP:  [5 cmH20] 5 cmH20 Plateau Pressure:  [18 cmH20] 18 cmH20  No intake or output data in the 24 hours ending 05/04/21 1644 Filed Weights   05/04/21 1625  Weight: 58 kg    Examination: General: ill appearing sedated woman on vent HENT: ETT in place, minimal secretions Lungs: Diminished on R with complex appearing R pleural collection Cardiovascular: Irregular, ext warm; bedside US RV dilated, mildly reduced function, LV also slightly down, no pericardial effusion Abdomen: Soft, hypoactive BS Extremities: lukewarm, dopplerable pulses Neuro: was flailing everything per EDP GU: foley being placed  Resolved Hospital Problem list   N/a  Assessment & Plan:  Acute hypoxemic respiratory failure Abnormal CXR Post lap chole 7/21 Hx DM, HTN, HLD  - f/u admit labs - probably needs chest tube to R, will check CTA to evaluate anatomy and also r/o PE - will let Gen Surg know she's here  Best Practice (right  click and "Reselect all SmartList Selections" daily)   Diet/type: NPO DVT prophylaxis: SCD GI prophylaxis: N/A Lines: N/A Foley:  Yes, and it is still needed Code Status:  full code Last date of multidisciplinary goals of care discussion [pending]  Labs   CBC: No results for input(s): WBC, NEUTROABS, HGB, HCT, MCV, PLT in the last 168 hours.  Basic Metabolic Panel: No results for input(s): NA, K, CL, CO2, GLUCOSE, BUN, CREATININE, CALCIUM, MG, PHOS in the last 168 hours. GFR: Estimated Creatinine Clearance: 42.5 mL/min (by C-G formula based on SCr of 0.88 mg/dL). No results for input(s): PROCALCITON, WBC, LATICACIDVEN in the last 168 hours.  Liver Function Tests: No results for input(s): AST, ALT, ALKPHOS, BILITOT, PROT, ALBUMIN in the last 168 hours. No results for input(s): LIPASE, AMYLASE in the last 168 hours. No results for input(s): AMMONIA in the last 168 hours.  ABG No results found for: PHART, PCO2ART, PO2ART, HCO3, TCO2, ACIDBASEDEF, O2SAT   Coagulation Profile: No results for input(s): INR, PROTIME in the last 168 hours.  Cardiac Enzymes: No results for input(s): CKTOTAL, CKMB, CKMBINDEX, TROPONINI in the last 168 hours.  HbA1C: Hemoglobin A1C  Date/Time Value Ref Range Status  12/18/2020 10:20 AM 6.8 (A) 4.0 - 5.6 % Final  08/14/2020 01:35 PM 7.2 (A) 4.0 - 5.6 % Final   HbA1c, POC (controlled diabetic range)  Date/Time Value Ref Range Status  05/15/2020 03:27 PM 8.2 (A) 0.0 - 7.0 % Final  10/28/2019 02:30 PM 8.2 (A) 0.0 - 7.0 %  Final   Hgb A1c MFr Bld  Date/Time Value Ref Range Status  04/26/2021 11:21 AM 7.4 (H) 4.8 - 5.6 % Final    Comment:    (NOTE) Pre diabetes:          5.7%-6.4%  Diabetes:              >6.4%  Glycemic control for   <7.0% adults with diabetes     CBG: Recent Labs  Lab 05/02/21 0613 05/02/21 0945  GLUCAP 70 78    Review of Systems:   Limited, patient intubated and sedated.  Past Medical History:  She,  has a past  medical history of Allergy, Arthritis, Breast cancer ([place]) (04/2008), Cataracts, bilateral, Cervical cancer ([place]), Depression, Diabetes mellitus without complication ([place]), Heart attack ([place]) (2004), Hurthle cell adenoma (03/2003), Hyperlipidemia, Hypertension, and Personal history of radiation therapy (2009).   Surgical History:   Past Surgical History:  Procedure Laterality Date   BREAST LUMPECTOMY Right 2009   BREAST SURGERY  2009   right lumpectomy   [place] N/A 05/02/2021   Procedure: LAPAROSCOPIC CHOLECYSTECTOMY;  Surgeon: Greer Pickerel, MD;  Location: [facility];  Service: General;  Laterality: N/A;   COLONOSCOPY     LEFT HEART CATHETERIZATION WITH CORONARY ANGIOGRAM N/A 04/20/2014   Procedure: LEFT HEART CATHETERIZATION WITH CORONARY ANGIOGRAM;  Surgeon: Jacolyn Reedy, MD;  Location[facility] C[facility] LAB;  Service: Cardiovascular;  Laterality: N/A;   POLYPECTOMY     STENT PLACEMENT VASCULAR ([place] HX)     thyroid     for nodule hemithyroidectomy, benign   THYROIDECTOMY Right 03/2003     Social History:   reports that she has never smoked. She has never used smokeless tobacco. She reports that she does not drink alcohol and does not use drugs.   Family History:  Her family history includes Cancer in her father; Cirrhosis in her brother; Diabetes in her daughter; Heart disease in her brother, father, and mother; Hypertension in her brother; Stroke in her daughter. There is no history of Colon cancer, Colon polyps, Esophageal cancer, Stomach cancer, or Rectal cancer.   Allergies No Known Allergies   Home Medications  Prior to Admission medications   Medication Sig Start Date End Date Taking? Authorizing Provider  acetaminophen (TYLENOL) 500 MG tablet Take 2 tablets (1,000 mg total) by mouth every 8 (eight) hours for 5 days. 05/02/21 05/07/21  Greer Pickerel, MD  amLODipine (NORVASC) 10 MG tablet TAKE 1 TABLET BY MOUTH EVERY DAY 07/20/20   Gifford Shave, MD   aspirin EC 81 MG tablet Take 81 mg by mouth in the morning. Swallow whole.    [provider]  Blood Glucose Monitoring Suppl MISC One touch ultra glucose monitor. Check blood sugar twice a day.    [provider]  Cholecalciferol (VITAMIN D3) 50 MCG (2000 UT) TABS Take 2,000 Units by mouth in the morning.    [provider]  glucose blood (ONE TOUCH ULTRA TEST) test strip CHECK BLOOD SUGAR TWICE DAILY 09/24/18   Everrett Coombe, MD  hydrochlorothiazide (HYDRODIURIL) 12.5 MG tablet TAKE 1 TABLET BY MOUTH EVERY DAY 09/25/20   Gifford Shave, MD  Insulin Pen Needle (B-D UF III MINI PEN NEEDLES) 31G X 5 MM MISC INJECT INSULIN VIA PEN 6 TIMES DAILY 02/22/18   Everrett Coombe, MD  JARDIANCE 10 MG TABS tablet TAKE 1 TABLET BY MOUTH EVERY DAY 12/05/20   Gifford Shave, MD  LANTUS SOLOSTAR 100 UNIT/ML Solostar Pen [employer]  24 UNITS INTO THE SKIN EVERY MORNING. 12/31/20   Gifford Shave, MD  losartan (COZAAR) 50 MG tablet Take 50 mg by mouth in the morning. 05/24/19   [provider]  metFORMIN (GLUCOPHAGE-XR) 500 MG 24 hr tablet TAKE 1 TABLET BY MOUTH TWICE A DAY WITH BREAKFAST AND DINNER 03/25/21   Gifford Shave, MD  nitroGLYCERIN (NITROSTAT) 0.4 MG SL tablet Place 1 tablet (0.4 mg total) under the tongue every 5 (five) minutes as needed for chest pain. Patient taking differently: Place 0.4 mg under the tongue every 5 (five) minutes x 3 doses as needed for chest pain. 07/12/19   Zenia Resides, MD  omeprazole (PRILOSEC) 40 MG capsule Take 40 mg by mouth every morning. 12/12/20   [provider]  OneTouch Delica Lancets 99991111 MISC 1 Container by Does not apply route as needed. 06/07/19   Zenia Resides, MD  rosuvastatin (CRESTOR) 20 MG tablet TAKE 1 TABLET BY MOUTH EVERYDAY AT BEDTIME Patient taking differently: Take 20 mg by mouth at bedtime. 06/05/20   Gifford Shave, MD  traMADol (ULTRAM) 50 MG tablet Take 1 tablet (50 mg total) by mouth every 6 (six) hours as  needed for up to 5 days. 05/02/21 05/07/21  Greer Pickerel, MD     Critical care time: 32 minutes not including any separately billable procedures

## 2021-05-04 NOTE — ED Notes (Signed)
Norepi restarted after BP 87/65, see EMAR

## 2021-05-04 NOTE — Progress Notes (Signed)
At the bedside in ED trauma bay C, the patient's right chest was prepped and draped in sterile fashion.  She was hemodynamically unstable so we proceeded emergently.  A small incision was made in the skin.  A catheter thoracostomy was placed using Seldinger technique in the right chest anterior axillary line just above the inframammary crease.  Seropurulent fluid drained from the chest.  The catheter was connected to the pleurevac and there was some air leak.  The patient tolerated the procedure well and was taken to CT.    Felicie Morn, MD General, Bariatric and Minimally Invasive Surgery [facility], [place]

## 2021-05-04 NOTE — Progress Notes (Signed)
Pharmacy Antibiotic Note  Dana Coleman is a 80 y.o. female admitted on 05/04/2021 with resp failure s/p recent lap chole for symptomatic cholelithiasis on 7/21. Found to have hydropneumothorax. Pharmacy has been consulted to start Zosyn for intra-abdominal coverage.   The patient is noted to have AKI (BL<1, 0.88 on 7/15) - SCr now 4.4.   Plan: - Zosyn 3.375g IV x 1 now - Start Zosyn 3.375g IV every 12 hours (infused over 4h) - Will continue to follow renal function, culture results, LOT, and antibiotic de-escalation plans   Height: '5\' 1"'$  (154.9 cm) Weight: 58 kg (127 lb 13.9 oz) IBW/kg (Calculated) : 47.8  Temp (24hrs), Avg:96 F (35.6 C), Min:96 F (35.6 C), Max:96 F (35.6 C)  Recent Labs  Lab 05/04/21 1632 05/04/21 1637 05/04/21 1644 05/04/21 1912  WBC 12.3*  --   --   --   CREATININE 4.07*  --  4.40*  --   LATICACIDVEN  --  >11.0*  --  >11.0*    Estimated Creatinine Clearance: 8.5 mL/min (A) (by C-G formula based on SCr of 4.4 mg/dL (H)).    No Known Allergies   Thank you for allowing pharmacy to be a part of this patient's care.  Alycia Rossetti, PharmD, BCPS Clinical Pharmacist Clinical phone for 05/04/2021: [phone] 05/04/2021 8:20 PM   **Pharmacist phone directory can now be found on [email or web address] ([email or web address]).  Listed under [place].

## 2021-05-04 NOTE — Progress Notes (Addendum)
eLink Physician-Brief Progress Note Patient Name: PETRONA RUCKEL DOB: 1940/10/31 MRN: [id number]   Date of Service  05/04/2021  HPI/Events of Note  Severe lactic acidosis in setting of suspected R empyema s/p chest tube insertion. Patient now with new onset slow Afib with ventricular response rate ~ 40.  Last/first lactic acid > 11.0.  Patient was given an amp of NaHCO3 with improvement in HR to ~50-60.   eICU Interventions  Ordered 1L of isotonic NaHCO3 to run as a bolus. RN to also complete 1L bolus of LR.  Repeat lactic acid in 1 hour.  Discussed case with bedside team (Dr. Carson Myrtle) due to impending need for CRRT/HD for management of severe lactic acidosis. He is going to assess the patient accordingly.   ADDENDUM 05/04/21 11:06 PM  - Lactic acid remains > 11. - Patient remains anuric. - Surgery has seen the patient since my last assessment. Per RN, they do not plan to take to OR for ex-lap given benign abdominal exam (argues against abdominal source of elevated lactic acid). - I have called Nephrology (Dr. Jonnie Finner) for dialysis orders for management of the patient's severe lactic acidosis. They are in agreement and will place orders momentarily. - Bedside team notified of need for HD line.  Intervention Category Major Interventions: Arrhythmia - evaluation and management;Hypotension - evaluation and management;Acid-Base disturbance - evaluation and management  Charlott Rakes 05/04/2021, 9:09 PM

## 2021-05-04 NOTE — ED Triage Notes (Signed)
Pt arrived via [facility] for ams and shortness of breath. Pt is 3 days post-op gall bladder surgery, was with family where she had been c/o abdominal pain. Pt had sudden onset AMS. Upon EMS arrival SpO2 was 60s on RA, increased to 82% w/ NRB 15L. Pt bradycardic in 40s, hypotensive in 88/60. 22 L forearm, 20 R wrist

## 2021-05-04 NOTE — ED Provider Notes (Signed)
[facility] EMERGENCY DEPARTMENT Provider Note   CSN: [id number] Arrival date & time: 05/04/21  1554     History Chief Complaint  Patient presents with   Altered Mental Status   Hypotension   Bradycardia    Dana Coleman is a 80 y.o. female.  The history is provided by the EMS personnel and medical records.  Altered Mental Status Dana Coleman is a 80 y.o. female who presents to the Emergency Department complaining of altered mental status. Level V caveat due to altered mental status. History is provided by EMS. She presents the emergency department for evaluation of unresponsiveness. EMS reports that they were called out for unresponsiveness. Patient is recently status post cholecystectomy on July 21. Per report she was complaining to family of abdominal discomfort and then became unresponsive slumped over the table. EMS reports hypotension, bradycardia and hypoxia with sats in the 70s on room air. On nonrebreather her sats in the 80s.    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   Hyperlipidemia    Hypertension    Personal history of radiation therapy 2009    Patient Active Problem List   Diagnosis Date Noted   Respiratory failure ([place]) 05/04/2021   Vaginal itching 03/25/2021   Left hip pain 02/12/2021   GERD (gastroesophageal reflux disease) 12/19/2020   Neck muscle spasm 02/16/2019   Breast lump 12/07/2018   Plantar fasciitis, right 09/30/2018   Plantar wart 11/17/2017   Insulin dependent type 2 diabetes mellitus, uncontrolled ([place]) 08/14/2014   History of thyroid nodule 01/16/2011   History of breast cancer  (right)  stage 1 ERA positive    History of cervical cancer    Hyperlipidemia    Hypertension    CAD (coronary artery disease), native coronary artery    Osteopenia 12/10/2006    Past Surgical History:  Procedure Laterality Date   BREAST LUMPECTOMY Right 2009   BREAST SURGERY  2009   right lumpectomy   [place] N/A 05/02/2021   Procedure: LAPAROSCOPIC CHOLECYSTECTOMY;  Surgeon: Greer Pickerel, MD;  Location: [facility] ORS;  Service: General;  Laterality: N/A;   COLONOSCOPY     LEFT HEART CATHETERIZATION WITH CORONARY ANGIOGRAM N/A 04/20/2014   Procedure: LEFT HEART CATHETERIZATION WITH CORONARY ANGIOGRAM;  Surgeon: Jacolyn Reedy, MD;  Location: [facility] CATH LAB;  Service: Cardiovascular;  Laterality: N/A;   POLYPECTOMY     STENT PLACEMENT VASCULAR ([place] H[facility])     thyroid     for nodule hemithyroidectomy, benign   THYROIDECTOMY Right 03/2003     OB History   No obstetric history on file.     Family History  Problem Relation Age of Onset   Heart disease Mother        age 40   Heart disease Father    Cancer Father        unknown   Heart disease Brother    Hypertension Brother    Diabetes Daughter    Stroke Daughter    Cirrhosis Brother  Colon cancer Neg Hx    Colon polyps Neg Hx    Esophageal cancer Neg Hx    Stomach cancer Neg Hx    Rectal cancer Neg Hx     Social History   Tobacco Use   Smoking status: Never   Smokeless tobacco: Never   Tobacco comments:    No plans to start  Vaping Use   Vaping Use: Never used  Substance Use Topics   Alcohol use: No   Drug use: No    Home Medications Prior to Admission medications   Medication Sig Start Date End Date Taking? Authorizing Provider  acetaminophen (TYLENOL) 500 MG tablet Take 2 tablets (1,000 mg total) by mouth every 8 (eight) hours for 5 days. 05/02/21 05/07/21  Greer Pickerel, MD  amLODipine (NORVASC) 10 MG tablet TAKE 1 TABLET BY MOUTH EVERY DAY 07/20/20   Gifford Shave, MD  aspirin  EC 81 MG tablet Take 81 mg by mouth in the morning. Swallow whole.    [provider]  Blood Glucose Monitoring Suppl MISC One touch ultra glucose monitor. Check blood sugar twice a day.    [provider]  Cholecalciferol (VITAMIN D3) 50 MCG (2000 UT) TABS Take 2,000 Units by mouth in the morning.    [provider]  glucose blood (ONE TOUCH ULTRA TEST) test strip CHECK BLOOD SUGAR TWICE DAILY 09/24/18   Everrett Coombe, MD  hydrochlorothiazide (HYDRODIURIL) 12.5 MG tablet TAKE 1 TABLET BY MOUTH EVERY DAY 09/25/20   Gifford Shave, MD  Insulin Pen Needle (B-D UF III MINI PEN NEEDLES) 31G X 5 MM MISC INJECT INSULIN VIA PEN 6 TIMES DAILY 02/22/18   Everrett Coombe, MD  JARDIANCE 10 MG TABS tablet TAKE 1 TABLET BY MOUTH EVERY DAY 12/05/20   Gifford Shave, MD  LANTUS SOLOSTAR 100 UNIT/ML Solostar Pen INJECT 24 UNITS INTO THE SKIN EVERY MORNING. 12/31/20   Gifford Shave, MD  losartan (COZAAR) 50 MG tablet Take 50 mg by mouth in the morning. 05/24/19   [provider]  metFORMIN (GLUCOPHAGE-XR) 500 MG 24 hr tablet TAKE 1 TABLET BY MOUTH TWICE A DAY WITH BREAKFAST AND DINNER 03/25/21   Gifford Shave, MD  nitroGLYCERIN (NITROSTAT) 0.4 MG SL tablet Place 1 tablet (0.4 mg total) under the tongue every 5 (five) minutes as needed for chest pain. Patient taking differently: Place 0.4 mg under the tongue every 5 (five) minutes x 3 doses as needed for chest pain. 07/12/19   Zenia Resides, MD  omeprazole (PRILOSEC) 40 MG capsule Take 40 mg by mouth every morning. 12/12/20   [provider]  OneTouch Delica Lancets 42H MISC 1 Container by Does not apply route as needed. 06/07/19   Zenia Resides, MD  rosuvastatin (CRESTOR) 20 MG tablet TAKE 1 TABLET BY MOUTH EVERYDAY AT BEDTIME Patient taking differently: Take 20 mg by mouth at bedtime. 06/05/20   Gifford Shave, MD  traMADol (ULTRAM) 50 MG tablet Take 1 tablet (50 mg total) by mouth every 6 (six) hours as needed for  up to 5 days. 05/02/21 05/07/21  Greer Pickerel, MD    Allergies    Patient has no known allergies.  Review of Systems   Review of Systems  All other systems reviewed and are negative.  Physical Exam Updated Vital Signs BP (!) 120/55   Pulse (!) 56   Temp (!) 96.44 F (35.8 C)   Resp (!) 26   Ht '5\' 1"'  (1.549 m)   Wt 58 kg  SpO2 96%   BMI 24.16 kg/m   Physical Exam Vitals and nursing note reviewed.  Constitutional:      General: She is in acute distress.     Appearance: She is well-developed. She is ill-appearing.  HENT:     Head: Normocephalic and atraumatic.  Cardiovascular:     Rate and Rhythm: Bradycardia present. Rhythm irregular.     Heart sounds: No murmur heard. Pulmonary:     Effort: Respiratory distress present.     Comments: Decreased air movement in right lung fields.   Abdominal:     Palpations: Abdomen is soft.     Tenderness: There is no abdominal tenderness. There is no guarding or rebound.     Comments: Postoperative surgical sites are clean, dry, intact  Musculoskeletal:        General: No swelling or tenderness.  Skin:    General: Skin is dry.     Comments: Cool skin  Neurological:     Comments: Opens eyes to verbal stimuli. Nonverbal. Weakly moves all extremities.  Psychiatric:     Comments: Unable to assess    ED Results / Procedures / Treatments   Labs (all labs ordered are listed, but only abnormal results are displayed) Labs Reviewed  COMPREHENSIVE METABOLIC PANEL - Abnormal; Notable for the following components:      Result Value   CO2 8 (*)    Glucose, Bld 118 (*)    BUN 38 (*)    Creatinine, Ser 4.07 (*)    Total Protein 5.2 (*)    Albumin 2.1 (*)    AST 138 (*)    ALT 97 (*)    GFR, Estimated 11 (*)    Anion gap 26 (*)    All other components within normal limits  CBC WITH DIFFERENTIAL/PLATELET - Abnormal; Notable for the following components:   WBC 12.3 (*)    RBC 3.58 (*)    Hemoglobin 10.8 (*)    MCV 104.2 (*)    MCHC  29.0 (*)    nRBC 0.4 (*)    Neutro Abs 9.5 (*)    Monocytes Absolute 1.5 (*)    Abs Immature Granulocytes 0.19 (*)    All other components within normal limits  BRAIN NATRIURETIC PEPTIDE - Abnormal; Notable for the following components:   B Natriuretic Peptide 2,140.9 (*)    All other components within normal limits  LACTIC ACID, PLASMA - Abnormal; Notable for the following components:   Lactic Acid, Venous >11.0 (*)    All other components within normal limits  LACTIC ACID, PLASMA - Abnormal; Notable for the following components:   Lactic Acid, Venous >11.0 (*)    All other components within normal limits  URINALYSIS, ROUTINE W REFLEX MICROSCOPIC - Abnormal; Notable for the following components:   Color, Urine AMBER (*)    APPearance CLOUDY (*)    Glucose, UA >=500 (*)    Bacteria, UA RARE (*)    Non Squamous Epithelial 0-5 (*)    All other components within normal limits  BODY FLUID CELL COUNT WITH DIFFERENTIAL - Abnormal; Notable for the following components:   Appearance, Fluid TURBID (*)    Total Nucleated Cell Count, Fluid 21,950 (*)    Neutrophil Count, Fluid 90 (*)    Monocyte-Macrophage-Serous Fluid 0 (*)    All other components within normal limits  GLUCOSE, CAPILLARY - Abnormal; Notable for the following components:   Glucose-Capillary 109 (*)    All other components within normal limits  LACTIC  ACID, PLASMA - Abnormal; Notable for the following components:   Lactic Acid, Venous >11.0 (*)    All other components within normal limits  GLUCOSE, CAPILLARY - Abnormal; Notable for the following components:   Glucose-Capillary 106 (*)    All other components within normal limits  GLUCOSE, CAPILLARY - Abnormal; Notable for the following components:   Glucose-Capillary 127 (*)    All other components within normal limits  I-STAT VENOUS BLOOD GAS, ED - Abnormal; Notable for the following components:   pH, Ven 7.072 (*)    pCO2, Ven 26.1 (*)    pO2, Ven 120.0 (*)     Bicarbonate 7.6 (*)    TCO2 8 (*)    Acid-base deficit 21.0 (*)    Sodium 132 (*)    Potassium 8.0 (*)    Calcium, Ion 1.14 (*)    All other components within normal limits  I-STAT CHEM 8, ED - Abnormal; Notable for the following components:   Sodium 133 (*)    Potassium 8.0 (*)    BUN 60 (*)    Creatinine, Ser 4.40 (*)    Glucose, Bld 102 (*)    TCO2 10 (*)    All other components within normal limits  POCT I-STAT 7, (LYTES, BLD GAS, ICA,H+H) - Abnormal; Notable for the following components:   pH, Arterial 7.176 (*)    pO2, Arterial 116 (*)    Bicarbonate 12.7 (*)    TCO2 14 (*)    Acid-base deficit 15.0 (*)    All other components within normal limits  POCT I-STAT 7, (LYTES, BLD GAS, ICA,H+H) - Abnormal; Notable for the following components:   pH, Arterial 7.304 (*)    pCO2 arterial 22.8 (*)    pO2, Arterial 66 (*)    Bicarbonate 11.6 (*)    TCO2 12 (*)    Acid-base deficit 14.0 (*)    HCT 35.0 (*)    Hemoglobin 11.9 (*)    All other components within normal limits  RESP PANEL BY RT-PCR (FLU A&B, COVID) ARPGX2  MRSA NEXT GEN BY PCR, NASAL  BODY FLUID CULTURE W GRAM STAIN  GLUCOSE, PLEURAL OR PERITONEAL FLUID  PROTEIN, PLEURAL OR PERITONEAL FLUID  CBC  MAGNESIUM  AMYLASE, PLEURAL OR PERITONEAL FLUID                BLOOD GAS, ARTERIAL  LACTATE DEHYDROGENASE, PLEURAL OR PERITONEAL FLUID  TRIGLYCERIDES, BODY FLUIDS  PATHOLOGIST SMEAR REVIEW  RENAL FUNCTION PANEL  RENAL FUNCTION PANEL  APTT  PROTIME-INR  I-STAT ARTERIAL BLOOD GAS, ED    EKG None  Radiology CT ABDOMEN PELVIS WO CONTRAST  Result Date: 05/04/2021 CLINICAL DATA:  Pneumothorax, abdominal distension per order. Per Dr. Robby Sermon request: the nurse pushed 63m contrast just before scanning through NG tube to rule out pooling in the chest. Cholecystectomy performed on 05/02/2021. EXAM: CT CHEST, ABDOMEN AND PELVIS WITHOUT CONTRAST TECHNIQUE: Multidetector CT imaging of the chest, abdomen and pelvis  was performed following the standard protocol without IV contrast. COMPARISON:  Current chest radiograph. Abdomen and pelvis CT, 07/28/2008. Right upper quadrant ultrasound, 01/21/2021. FINDINGS: CT CHEST FINDINGS Cardiovascular: Heart is normal in size. Three-vessel coronary artery calcifications. No pericardial effusion. Great vessels are top-normal in size. Mild aortic atherosclerotic calcifications. Mediastinum/Nodes: Endotracheal tube tip lies 2 cm above the carinal. No neck base, mediastinal or hilar masses. No enlarged lymph nodes. Trachea is unremarkable. There is contrast within the esophagus. Nasogastric tube tip terminates in the distal esophagus. Lungs/Pleura: Right-sided chest  tube has its tip in the posterior mid right hemithorax. Right-sided hydropneumothorax. Pneumothorax is approximately 40%. Small 2 moderate amount of pleural fluid with intermixed bubbles of air. Fluid appears partly loculated. Trace amount of left pleural fluid. No left pneumothorax. There is opacity in the posterior aspect of the right upper lobe and middle lobe with collapse/consolidation of the right lower lobe. Minimal dependent atelectasis is noted on the left. Left lung otherwise clear. Musculoskeletal: No fracture or acute finding.  No bone lesion. CT ABDOMEN PELVIS FINDINGS Hepatobiliary: No focal liver abnormality is seen. Status post cholecystectomy. No biliary dilatation. Pancreas: Unremarkable. No pancreatic ductal dilatation or surrounding inflammatory changes. Spleen: Relatively small spleen.  No mass. Adrenals/Urinary Tract: No adrenal masses. Bilateral renal masses. 6 cm hypoattenuating mass arises from the lower pole of the right kidney. Somewhat more complex appearing mass, also hypoattenuating, arises from the mid to upper pole the left kidney, 5.9 cm in size. Smaller round mass arises from the anterior lower pole the left kidney, 2 cm in size. These are all consistent with cysts. No other renal masses, no  stones and no hydronephrosis. Ureters are normal in course and in caliber. Bladder is mostly decompressed with a Foley catheter. Stomach/Bowel: Stomach is unremarkable. Small bowel and colon are normal in caliber. No wall thickening. No inflammation. Normal appendix visualized. Vascular/Lymphatic: Aortic atherosclerosis. No enlarged abdominal or pelvic lymph nodes. Reproductive: Unremarkable. Other: Small bubbles of air are noted in the anterior abdominal wall subcutaneous fat consistent with postoperative air. There is hazy inflammatory type change in the upper abdomen centered on the porta hepatis, also consistent with expected postoperative change. No ascites. No abdominal wall hernia. Musculoskeletal: No fracture or acute finding.  No bone lesion. IMPRESSION: 1. Right hydropneumothorax. Pneumothorax is approximately 40%. There is partly loculated pleural fluid and contains bubbles of air. A right-sided chest tube has its tip lying in the posterior right mid hemithorax. 2. Consolidation/atelectasis in the dependent right lung, involving the entire right lower lobe. A component of infection should be considered in the proper clinical setting. 3. Nasogastric tube tip lies in the distal esophagus. 4. Hazy opacity noted in the upper abdomen centered on the porta hepatis consistent with the expected postoperative change following the recent cholecystectomy. There is no abscess or evidence of an operative complication. 5. Coronary artery and aortic atherosclerosis. 6. Well-positioned endotracheal tube. Electronically Signed   By: Lajean Manes M.D.   On: 05/04/2021 18:35   CT HEAD WO CONTRAST  Result Date: 05/04/2021 CLINICAL DATA:  80 year old female with altered mental status. EXAM: CT HEAD WITHOUT CONTRAST TECHNIQUE: Contiguous axial images were obtained from the base of the skull through the vertex without intravenous contrast. COMPARISON:  None. FINDINGS: Brain: Mild age-related atrophy and moderate chronic  microvascular ischemic changes. There is no acute intracranial hemorrhage. No mass effect or midline shift. No extra-axial fluid collection. Vascular: No hyperdense vessel or unexpected calcification. Skull: Normal. Negative for fracture or focal lesion. Sinuses/Orbits: No acute finding. Other: An endotracheal and enteric tube are partially visualized. IMPRESSION: 1. No acute intracranial pathology. 2. Age-related atrophy and chronic microvascular ischemic changes. Electronically Signed   By: Anner Crete M.D.   On: 05/04/2021 18:19   CT Chest Wo Contrast  Result Date: 05/04/2021 CLINICAL DATA:  Pneumothorax, abdominal distension per order. Per Dr. Robby Sermon request: the nurse pushed 43m contrast just before scanning through NG tube to rule out pooling in the chest. Cholecystectomy performed on 05/02/2021. EXAM: CT CHEST, ABDOMEN AND PELVIS WITHOUT  CONTRAST TECHNIQUE: Multidetector CT imaging of the chest, abdomen and pelvis was performed following the standard protocol without IV contrast. COMPARISON:  Current chest radiograph. Abdomen and pelvis CT, 07/28/2008. Right upper quadrant ultrasound, 01/21/2021. FINDINGS: CT CHEST FINDINGS Cardiovascular: Heart is normal in size. Three-vessel coronary artery calcifications. No pericardial effusion. Great vessels are top-normal in size. Mild aortic atherosclerotic calcifications. Mediastinum/Nodes: Endotracheal tube tip lies 2 cm above the carinal. No neck base, mediastinal or hilar masses. No enlarged lymph nodes. Trachea is unremarkable. There is contrast within the esophagus. Nasogastric tube tip terminates in the distal esophagus. Lungs/Pleura: Right-sided chest tube has its tip in the posterior mid right hemithorax. Right-sided hydropneumothorax. Pneumothorax is approximately 40%. Small 2 moderate amount of pleural fluid with intermixed bubbles of air. Fluid appears partly loculated. Trace amount of left pleural fluid. No left pneumothorax. There is  opacity in the posterior aspect of the right upper lobe and middle lobe with collapse/consolidation of the right lower lobe. Minimal dependent atelectasis is noted on the left. Left lung otherwise clear. Musculoskeletal: No fracture or acute finding.  No bone lesion. CT ABDOMEN PELVIS FINDINGS Hepatobiliary: No focal liver abnormality is seen. Status post cholecystectomy. No biliary dilatation. Pancreas: Unremarkable. No pancreatic ductal dilatation or surrounding inflammatory changes. Spleen: Relatively small spleen.  No mass. Adrenals/Urinary Tract: No adrenal masses. Bilateral renal masses. 6 cm hypoattenuating mass arises from the lower pole of the right kidney. Somewhat more complex appearing mass, also hypoattenuating, arises from the mid to upper pole the left kidney, 5.9 cm in size. Smaller round mass arises from the anterior lower pole the left kidney, 2 cm in size. These are all consistent with cysts. No other renal masses, no stones and no hydronephrosis. Ureters are normal in course and in caliber. Bladder is mostly decompressed with a Foley catheter. Stomach/Bowel: Stomach is unremarkable. Small bowel and colon are normal in caliber. No wall thickening. No inflammation. Normal appendix visualized. Vascular/Lymphatic: Aortic atherosclerosis. No enlarged abdominal or pelvic lymph nodes. Reproductive: Unremarkable. Other: Small bubbles of air are noted in the anterior abdominal wall subcutaneous fat consistent with postoperative air. There is hazy inflammatory type change in the upper abdomen centered on the porta hepatis, also consistent with expected postoperative change. No ascites. No abdominal wall hernia. Musculoskeletal: No fracture or acute finding.  No bone lesion. IMPRESSION: 1. Right hydropneumothorax. Pneumothorax is approximately 40%. There is partly loculated pleural fluid and contains bubbles of air. A right-sided chest tube has its tip lying in the posterior right mid hemithorax. 2.  Consolidation/atelectasis in the dependent right lung, involving the entire right lower lobe. A component of infection should be considered in the proper clinical setting. 3. Nasogastric tube tip lies in the distal esophagus. 4. Hazy opacity noted in the upper abdomen centered on the porta hepatis consistent with the expected postoperative change following the recent cholecystectomy. There is no abscess or evidence of an operative complication. 5. Coronary artery and aortic atherosclerosis. 6. Well-positioned endotracheal tube. Electronically Signed   By: Lajean Manes M.D.   On: 05/04/2021 18:35   DG Chest Port 1 View  Result Date: 05/04/2021 CLINICAL DATA:  Pneumothorax EXAM: PORTABLE CHEST 1 VIEW COMPARISON:  05/04/2021 at 4:17 p.m. FINDINGS: Unchanged appearance of endotracheal tube and esophageal catheter. The right pneumothorax is decreased in size following placement a right chest wall catheter. Left lung is clear. IMPRESSION: Decrease in size of right pneumothorax following chest wall catheter placement. Electronically Signed   By: Cletus Gash.D.  On: 05/04/2021 22:33   DG Chest Port 1 View  Result Date: 05/04/2021 CLINICAL DATA:  sob EXAM: PORTABLE CHEST 1 VIEW COMPARISON:  March 29, 2021 FINDINGS: Evaluation is limited secondary to mode tubal overlying lines. The cardiomediastinal silhouette is enlarged in contour.Atherosclerotic calcifications of the aorta. Large RIGHT pleural effusion. ETT tip terminates over the RIGHT mainstem bronchus. The enteric tube courses through the chest to the abdomen beyond the field-of-view. No pneumothorax. Bibasilar homogeneous opacities, likely atelectasis. Visualized abdomen is unremarkable. Multilevel degenerative changes of the thoracic spine. IMPRESSION: 1.  ETT tip terminates over the RIGHT mainstem bronchus. 2.  Large loculated appearing RIGHT-sided pleural effusion. These results were called by telephone at the time of interpretation on 05/04/2021 at 4:29  pm to provider [facility] , who verbally acknowledged these results. Electronically Signed   By: Valentino Saxon MD   On: 05/04/2021 16:31    Procedures Procedure Name: Intubation Date/Time: 05/04/2021 5:54 PM Performed by: Quintella Reichert, MD Pre-anesthesia Checklist: Patient identified, Patient being monitored, Emergency Drugs available, Timeout performed and Suction available Oxygen Delivery Method: Ambu bag Preoxygenation: Pre-oxygenation with 100% oxygen Induction Type: Rapid sequence Ventilation: Mask ventilation without difficulty Laryngoscope Size: Glidescope Tube size: 7.5 mm Number of attempts: 1 Placement Confirmation: ETT inserted through vocal cords under direct vision, CO2 detector and Breath sounds checked- equal and bilateral Secured at: 25 cm Tube secured with: ETT holder Dental Injury: Teeth and Oropharynx as per pre-operative assessment       Medications Ordered in ED Medications  fentaNYL (SUBLIMAZE) injection 25 mcg (has no administration in time range)  fentaNYL 2527mg in NS 2529m(101mml) infusion-PREMIX (15 mcg/hr Intravenous Infusion Verify 05/04/21 2200)  fentaNYL (SUBLIMAZE) bolus via infusion 25-100 mcg (has no administration in time range)  norepinephrine (LEVOPHED) 4mg45m 250mL69mmix infusion (10 mcg/min Intravenous New Bag/Given 05/04/21 2301)  docusate (COLACE) 50 MG/5ML liquid 100 mg (has no administration in time range)  polyethylene glycol (MIRALAX / GLYCOLAX) packet 17 g (has no administration in time range)  pantoprazole (PROTONIX) injection 40 mg (40 mg Intravenous Given 05/04/21 2303)  insulin aspart (novoLOG) injection 0-15 Units (0 Units Subcutaneous Not Given 05/04/21 2101)  albuterol (PROVENTIL) (2.5 MG/3ML) 0.083% nebulizer solution 10 mg (has no administration in time range)  iohexol (OMNIPAQUE) 9 MG/ML oral solution (has no administration in time range)  0.9 %  sodium chloride infusion ( Intravenous Infusion Verify 05/04/21 2200)   chlorhexidine gluconate (MEDLINE KIT) (PERIDEX) 0.12 % solution 15 mL (15 mLs Mouth Rinse Given 05/04/21 2308)  MEDLINE mouth rinse (has no administration in time range)  sodium chloride 0.9 % primer fluid for CRRT (has no administration in time range)  prismasol BGK 4/2.5 infusion (has no administration in time range)  alteplase (CATHFLO ACTIVASE) injection 2 mg (has no administration in time range)  heparin injection 1,000-6,000 Units (has no administration in time range)  sodium bicarbonate 150 mEq in sterile water 1,150 mL infusion (has no administration in time range)  sodium bicarbonate 150 mEq in sterile water 1,150 mL infusion (has no administration in time range)  piperacillin-tazobactam (ZOSYN) IVPB 3.375 g (has no administration in time range)  doxycycline (VIBRAMYCIN) 100 mg in sodium chloride 0.9 % 250 mL IVPB (has no administration in time range)  calcium chloride injection (1 g Intravenous Given 05/04/21 1602)  sodium bicarbonate injection (50 mEq Intravenous Given 05/04/21 1603)  0.9 %  sodium chloride infusion (0 mLs Intravenous Stopping Infusion hung by another clincian 05/04/21 1900)  etomidate (AMIDATE) injection (  20 mg Intravenous Given 05/04/21 1608)  rocuronium (ZEMURON) injection (70 mg Intravenous Given 05/04/21 1609)  calcium gluconate inj 10% (1 g) URGENT USE ONLY! (1 g Intravenous Given 05/04/21 1728)  sodium bicarbonate injection 50 mEq (50 mEq Intravenous Given 05/04/21 1730)  lactated ringers bolus 2,000 mL (0 mLs Intravenous Stopped 05/04/21 2232)  piperacillin-tazobactam (ZOSYN) IVPB 3.375 g (0 g Intravenous Stopped 05/04/21 2153)  sodium bicarbonate 1 mEq/mL injection (50 mEq  Given 05/04/21 2109)  sodium bicarbonate 150 mEq in sterile water 1,150 mL infusion ( Intravenous New Bag/Given 05/04/21 2202)    ED Course  I have reviewed the triage vital signs and the nursing notes.  Pertinent labs & imaging results that were available during my care of the patient were  reviewed by me and considered in my medical decision making (see chart for details). CRITICAL CARE Performed by: Quintella Reichert   Total critical care time: 45 minutes  Critical care time was exclusive of separately billable procedures and treating other patients.  Critical care was necessary to treat or prevent imminent or life-threatening deterioration.  Critical care was time spent personally by me on the following activities: development of treatment plan with patient and/or surrogate as well as nursing, discussions with consultants, evaluation of patient's response to treatment, examination of patient, obtaining history from patient or surrogate, ordering and performing treatments and interventions, ordering and review of laboratory studies, ordering and review of radiographic studies, pulse oximetry and re-evaluation of patient's condition.    MDM Rules/Calculators/A&P                          patient presented to the emergency department with respiratory distress and altered mental status. On presentation patient hypotensive, lethargic, hypoxic on non rebreather. She was treated with calcium, bicarb for possible renal failure or acidosis and intubated with RSI for respiratory failure. She was treated with IV fluids, levophed for hypotension. Critical care consulted for admission and further treatment.  Final Clinical Impression(s) / ED Diagnoses Final diagnoses:  Acute respiratory failure with hypoxia ([place])  Acute renal failure, unspecified acute renal failure type [facility])    Rx / DC Orders ED Discharge Orders     None        Quintella Reichert, MD 05/05/21 0030

## 2021-05-04 NOTE — Consult Note (Signed)
Renal Service Consult Note [name]  Dana Coleman 05/04/2021 [name], MD Requesting Physician: Dr. Gillermina Phy  Reason for Consult: Renal failure and metabolic acidosis HPI: The patient is a 80 y.o. year-old w/ hx of HTN, IDDM, CAD who was recently admitted and underwent cholecystectomy on 7/21 presented to ED today 7/23 w/  AMS and SOB. In ED pt has low SpO2 82% on NRB at 15L, bradycardia in 40's and hypotension 88/60. CXR showed large loculated R pleural effusion. PCCM was consulted and intubated the patient.  Gen surgery placed a R chest tube for suspected empyema. Labs showed metabolic acidosis severe and creat 4 (normal creat 0.8 on 7/15).  Asked to see for renal failure.   Pt is sedated on the vent, on levo gtt and NaHCO3 IVF's.      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   Hyperlipidemia    Hypertension    Personal history of radiation therapy 2009   Past Surgical History  Past Surgical History:  Procedure Laterality Date   BREAST LUMPECTOMY Right 2009   BREAST SURGERY  2009   right lumpectomy   [place] N/A 05/02/2021   Procedure: LAPAROSCOPIC CHOLECYSTECTOMY;  Surgeon: Greer Pickerel, MD;  Location: [facility] ORS;  Service: General;  Laterality: N/A;   COLONOSCOPY     LEFT HEART CATHETERIZATION WITH CORONARY ANGIOGRAM N/A 04/20/2014   Procedure: LEFT HEART CATHETERIZATION WITH CORONARY ANGIOGRAM;  Surgeon: Jacolyn Reedy, MD;  Location: [facility] CATH  LAB;  Service: Cardiovascular;  Laterality: N/A;   POLYPECTOMY     STENT PLACEMENT VASCULAR ([place] [facility])     thyroid     for nodule hemithyroidectomy, benign   THYROIDECTOMY Right 03/2003   Family History  Family History  Problem Relation Age of Onset   Heart disease Mother        age 15   Heart disease Father    Cancer Father        unknown   Heart disease Brother    Hypertension Brother    Diabetes Daughter    Stroke Daughter    Cirrhosis Brother    Colon cancer Neg Hx    Colon polyps Neg Hx    Esophageal cancer Neg Hx    Stomach cancer Neg Hx    Rectal cancer Neg Hx    Social History  reports that she has never smoked. She has never used smokeless tobacco. She reports that she does not drink alcohol and does not use drugs. Allergies No Known Allergies Home medications Prior to Admission medications   Medication Sig Start Date End Date Taking? Authorizing Provider  acetaminophen (TYLENOL) 500 MG tablet Take 2 tablets (1,000 mg total) by mouth every 8 (eight) hours for 5 days. 05/02/21 05/07/21  Greer Pickerel, MD  amLODipine (NORVASC) 10 MG tablet TAKE 1 TABLET BY MOUTH EVERY DAY  07/20/20   Gifford Shave, MD  aspirin EC 81 MG tablet Take 81 mg by mouth in the morning. Swallow whole.    [provider]  Blood Glucose Monitoring Suppl MISC One touch ultra glucose monitor. Check blood sugar twice a day.    [provider]  Cholecalciferol (VITAMIN D3) 50 MCG (2000 UT) TABS Take 2,000 Units by mouth in the morning.    [provider]  glucose blood (ONE TOUCH ULTRA TEST) test strip CHECK BLOOD SUGAR TWICE DAILY 09/24/18   Everrett Coombe, MD  hydrochlorothiazide (HYDRODIURIL) 12.5 MG tablet TAKE 1 TABLET BY MOUTH EVERY DAY 09/25/20   Gifford Shave, MD  Insulin Pen Needle (B-D UF III MINI PEN NEEDLES) 31G X 5 MM MISC INJECT INSULIN VIA PEN 6 TIMES DAILY 02/22/18   Everrett Coombe, MD  JARDIANCE 10 MG TABS tablet TAKE 1 TABLET BY MOUTH EVERY DAY 12/05/20    Gifford Shave, MD  LANTUS SOLOSTAR 100 UNIT/ML Solostar Pen INJECT 24 UNITS INTO THE SKIN EVERY MORNING. 12/31/20   Gifford Shave, MD  losartan (COZAAR) 50 MG tablet Take 50 mg by mouth in the morning. 05/24/19   [provider]  metFORMIN (GLUCOPHAGE-XR) 500 MG 24 hr tablet TAKE 1 TABLET BY MOUTH TWICE A DAY WITH BREAKFAST AND DINNER 03/25/21   Gifford Shave, MD  nitroGLYCERIN (NITROSTAT) 0.4 MG SL tablet Place 1 tablet (0.4 mg total) under the tongue every 5 (five) minutes as needed for chest pain. Patient taking differently: Place 0.4 mg under the tongue every 5 (five) minutes x 3 doses as needed for chest pain. 07/12/19   Zenia Resides, MD  omeprazole (PRILOSEC) 40 MG capsule Take 40 mg by mouth every morning. 12/12/20   [provider]  OneTouch Delica Lancets [id number] MISC 1 Container by Does not apply route as needed. 06/07/19   Zenia Resides, MD  rosuvastatin (CRESTOR) 20 MG tablet TAKE 1 TABLET BY MOUTH EVERYDAY AT BEDTIME Patient taking differently: Take 20 mg by mouth at bedtime. 06/05/20   Gifford Shave, MD  traMADol (ULTRAM) 50 MG tablet Take 1 tablet (50 mg total) by mouth every 6 (six) hours as needed for up to 5 days. 05/02/21 05/07/21  Greer Pickerel, MD     Vitals:   05/04/21 2054 05/04/21 2056 05/04/21 2100 05/04/21 2200  BP:  (!) 92/59 127/78 (!) 120/55  Pulse: (!) 49 (!) 50 61 (!) 56  Resp: (!) 26 (!) 23 (!) 7 (!) 26  Temp:  (!) 95.36 F (35.2 C) (!) 95.54 F (35.3 C) (!) 96.44 F (35.8 C)  TempSrc:      SpO2: 95% 94% 96% 96%  Weight:      Height:       Exam Gen on vent, sedated No rash, cyanosis or gangrene Sclera anicteric, throat w/ ETT No jvd or bruits Chest clear anterior/ lateral, R chest pigtail chest tube RRR no MRG Abd soft ntnd no mass or ascites +bs GU foley cath draining clear amber urine MS no joint effusions or deformity Ext no LE or UE edema, no wounds or ulcers Neuro is on vent, sedated     Assessment/ Plan: AKI -  severe due to septic shock and hypoperfusion. Normal renal function on 04/26/21 w/ creat 0.88.  Recommend CRRT w/ pre/ past bicarb infusions for now. Keep even, no signs of volume overload.  Will follow.  SP cholecystectomy - on 7/21 R loculated effusion - sp chest tube, possible empyema, on IV abx  Septic  shock - on pressor support Metabolic acidosis - severe, as above.  IDDM - per CCM      Rob Durante Violett  MD 05/04/2021, 11:28 PM  Recent Labs  Lab 05/04/21 1632 05/04/21 1643 05/04/21 1835 05/04/21 2049  WBC 12.3*  --   --   --   HGB 10.8*   < > 12.6 11.9*   < > = values in this interval not displayed.   Recent Labs  Lab 05/04/21 1632 05/04/21 1643 05/04/21 1644 05/04/21 1835 05/04/21 2049  K 4.4   < > 8.0* 4.5 4.4  BUN 38*  --  60*  --   --   CREATININE 4.07*  --  4.40*  --   --   CALCIUM 10.0  --   --   --   --    < > = values in this interval not displayed.

## 2021-05-05 ENCOUNTER — Inpatient Hospital Stay (HOSPITAL_COMMUNITY): Payer: Medicare Other

## 2021-05-05 ENCOUNTER — Encounter (HOSPITAL_COMMUNITY)
Admission: EM | Disposition: A | Discharge status: Skilled Nursing Facility | Payer: Self-pay | Source: Home / Self Care | Attending: Family Medicine

## 2021-05-05 ENCOUNTER — Inpatient Hospital Stay (HOSPITAL_COMMUNITY): Payer: Medicare Other | Admitting: Anesthesiology

## 2021-05-05 ENCOUNTER — Other Ambulatory Visit: Payer: Self-pay

## 2021-05-05 ENCOUNTER — Encounter (HOSPITAL_COMMUNITY): Payer: Self-pay | Admitting: Internal Medicine

## 2021-05-05 DIAGNOSIS — K223 Perforation of esophagus: Secondary | ICD-10-CM

## 2021-05-05 DIAGNOSIS — R4182 Altered mental status, unspecified: Secondary | ICD-10-CM

## 2021-05-05 DIAGNOSIS — L8990 Pressure ulcer of unspecified site, unspecified stage: Secondary | ICD-10-CM | POA: Diagnosis present

## 2021-05-05 DIAGNOSIS — N179 Acute kidney failure, unspecified: Secondary | ICD-10-CM | POA: Diagnosis not present

## 2021-05-05 DIAGNOSIS — J9601 Acute respiratory failure with hypoxia: Secondary | ICD-10-CM

## 2021-05-05 HISTORY — DX: Perforation of esophagus: K22.3

## 2021-05-05 HISTORY — DX: Pressure ulcer of unspecified site, unspecified stage: L89.90

## 2021-05-05 HISTORY — PX: THORACOTOMY: SHX5074

## 2021-05-05 LAB — POCT I-STAT 7, (LYTES, BLD GAS, ICA,H+H)
Acid-Base Excess: 2 mmol/L (ref 0.0–2.0)
Acid-Base Excess: 1 mmol/L (ref 0.0–2.0)
Acid-Base Excess: 1 mmol/L (ref 0.0–2.0)
Acid-Base Excess: 1 mmol/L (ref 0.0–2.0)
Acid-Base Excess: 2 mmol/L (ref 0.0–2.0)
Acid-Base Excess: 5 mmol/L — ABNORMAL HIGH (ref 0.0–2.0)
Acid-Base Excess: 4 mmol/L — ABNORMAL HIGH (ref 0.0–2.0)
Acid-Base Excess: 3 mmol/L — ABNORMAL HIGH (ref 0.0–2.0)
Bicarbonate: 26.5 mmol/L (ref 20.0–28.0)
Bicarbonate: 26.7 mmol/L (ref 20.0–28.0)
Bicarbonate: 27.1 mmol/L (ref 20.0–28.0)
Bicarbonate: 27.4 mmol/L (ref 20.0–28.0)
Bicarbonate: 27.8 mmol/L (ref 20.0–28.0)
Bicarbonate: 30.9 mmol/L — ABNORMAL HIGH (ref 20.0–28.0)
Bicarbonate: 28.5 mmol/L — ABNORMAL HIGH (ref 20.0–28.0)
Bicarbonate: 29.1 mmol/L — ABNORMAL HIGH (ref 20.0–28.0)
Calcium, Ion: 1 mmol/L — ABNORMAL LOW (ref 1.15–1.40)
Calcium, Ion: 0.95 mmol/L — ABNORMAL LOW (ref 1.15–1.40)
Calcium, Ion: 0.96 mmol/L — ABNORMAL LOW (ref 1.15–1.40)
Calcium, Ion: 0.98 mmol/L — ABNORMAL LOW (ref 1.15–1.40)
Calcium, Ion: 1.01 mmol/L — ABNORMAL LOW (ref 1.15–1.40)
Calcium, Ion: 0.9 mmol/L — ABNORMAL LOW (ref 1.15–1.40)
Calcium, Ion: 1.13 mmol/L — ABNORMAL LOW (ref 1.15–1.40)
Calcium, Ion: 1.14 mmol/L — ABNORMAL LOW (ref 1.15–1.40)
HCT: 36 % (ref 36.0–46.0)
HCT: 34 % — ABNORMAL LOW (ref 36.0–46.0)
HCT: 34 % — ABNORMAL LOW (ref 36.0–46.0)
HCT: 34 % — ABNORMAL LOW (ref 36.0–46.0)
HCT: 35 % — ABNORMAL LOW (ref 36.0–46.0)
HCT: 33 % — ABNORMAL LOW (ref 36.0–46.0)
HCT: 33 % — ABNORMAL LOW (ref 36.0–46.0)
HCT: 34 % — ABNORMAL LOW (ref 36.0–46.0)
Hemoglobin: 12.2 g/dL (ref 12.0–15.0)
Hemoglobin: 11.6 g/dL — ABNORMAL LOW (ref 12.0–15.0)
Hemoglobin: 11.6 g/dL — ABNORMAL LOW (ref 12.0–15.0)
Hemoglobin: 11.6 g/dL — ABNORMAL LOW (ref 12.0–15.0)
Hemoglobin: 11.9 g/dL — ABNORMAL LOW (ref 12.0–15.0)
Hemoglobin: 11.2 g/dL — ABNORMAL LOW (ref 12.0–15.0)
Hemoglobin: 11.2 g/dL — ABNORMAL LOW (ref 12.0–15.0)
Hemoglobin: 11.6 g/dL — ABNORMAL LOW (ref 12.0–15.0)
O2 Saturation: 100 %
O2 Saturation: 100 %
O2 Saturation: 100 %
O2 Saturation: 69 %
O2 Saturation: 79 %
O2 Saturation: 100 %
O2 Saturation: 100 %
O2 Saturation: 100 %
Patient temperature: 38.1
Patient temperature: 36.9
Patient temperature: 37
Potassium: 3.1 mmol/L — ABNORMAL LOW (ref 3.5–5.1)
Potassium: 3.4 mmol/L — ABNORMAL LOW (ref 3.5–5.1)
Potassium: 3.5 mmol/L (ref 3.5–5.1)
Potassium: 3.7 mmol/L (ref 3.5–5.1)
Potassium: 3.7 mmol/L (ref 3.5–5.1)
Potassium: 4.7 mmol/L (ref 3.5–5.1)
Potassium: 4.4 mmol/L (ref 3.5–5.1)
Potassium: 4.3 mmol/L (ref 3.5–5.1)
Sodium: 137 mmol/L (ref 135–145)
Sodium: 136 mmol/L (ref 135–145)
Sodium: 136 mmol/L (ref 135–145)
Sodium: 137 mmol/L (ref 135–145)
Sodium: 138 mmol/L (ref 135–145)
Sodium: 136 mmol/L (ref 135–145)
Sodium: 136 mmol/L (ref 135–145)
Sodium: 136 mmol/L (ref 135–145)
TCO2: 28 mmol/L (ref 22–32)
TCO2: 28 mmol/L (ref 22–32)
TCO2: 28 mmol/L (ref 22–32)
TCO2: 29 mmol/L (ref 22–32)
TCO2: 29 mmol/L (ref 22–32)
TCO2: 32 mmol/L (ref 22–32)
TCO2: 30 mmol/L (ref 22–32)
TCO2: 31 mmol/L (ref 22–32)
pCO2 arterial: 41.7 mmHg (ref 32.0–48.0)
pCO2 arterial: 43.9 mmHg (ref 32.0–48.0)
pCO2 arterial: 45.1 mmHg (ref 32.0–48.0)
pCO2 arterial: 49.9 mmHg — ABNORMAL HIGH (ref 32.0–48.0)
pCO2 arterial: 55.8 mmHg — ABNORMAL HIGH (ref 32.0–48.0)
pCO2 arterial: 52.6 mmHg — ABNORMAL HIGH (ref 32.0–48.0)
pCO2 arterial: 43.4 mmHg (ref 32.0–48.0)
pCO2 arterial: 48.3 mmHg — ABNORMAL HIGH (ref 32.0–48.0)
pH, Arterial: 7.411 (ref 7.350–7.450)
pH, Arterial: 7.311 — ABNORMAL LOW (ref 7.350–7.450)
pH, Arterial: 7.348 — ABNORMAL LOW (ref 7.350–7.450)
pH, Arterial: 7.388 (ref 7.350–7.450)
pH, Arterial: 7.392 (ref 7.350–7.450)
pH, Arterial: 7.377 (ref 7.350–7.450)
pH, Arterial: 7.425 (ref 7.350–7.450)
pH, Arterial: 7.387 (ref 7.350–7.450)
pO2, Arterial: 208 mmHg — ABNORMAL HIGH (ref 83.0–108.0)
pO2, Arterial: 179 mmHg — ABNORMAL HIGH (ref 83.0–108.0)
pO2, Arterial: 38 mmHg — CL (ref 83.0–108.0)
pO2, Arterial: 401 mmHg — ABNORMAL HIGH (ref 83.0–108.0)
pO2, Arterial: 51 mmHg — ABNORMAL LOW (ref 83.0–108.0)
pO2, Arterial: 405 mmHg — ABNORMAL HIGH (ref 83.0–108.0)
pO2, Arterial: 273 mmHg — ABNORMAL HIGH (ref 83.0–108.0)
pO2, Arterial: 275 mmHg — ABNORMAL HIGH (ref 83.0–108.0)

## 2021-05-05 LAB — POCT I-STAT, CHEM 8
BUN: 42 mg/dL — ABNORMAL HIGH (ref 8–23)
Calcium, Ion: 0.95 mmol/L — ABNORMAL LOW (ref 1.15–1.40)
Chloride: 97 mmol/L — ABNORMAL LOW (ref 98–111)
Creatinine, Ser: 1.8 mg/dL — ABNORMAL HIGH (ref 0.44–1.00)
Glucose, Bld: 113 mg/dL — ABNORMAL HIGH (ref 70–99)
HCT: 38 % (ref 36.0–46.0)
Hemoglobin: 12.9 g/dL (ref 12.0–15.0)
Potassium: 4.3 mmol/L (ref 3.5–5.1)
Sodium: 136 mmol/L (ref 135–145)
TCO2: 27 mmol/L (ref 22–32)

## 2021-05-05 LAB — CBC
HCT: 36.3 % (ref 36.0–46.0)
HCT: 32.8 % — ABNORMAL LOW (ref 36.0–46.0)
Hemoglobin: 11.7 g/dL — ABNORMAL LOW (ref 12.0–15.0)
Hemoglobin: 11.2 g/dL — ABNORMAL LOW (ref 12.0–15.0)
MCH: 29.4 pg (ref 26.0–34.0)
MCH: 29.9 pg (ref 26.0–34.0)
MCHC: 32.2 g/dL (ref 30.0–36.0)
MCHC: 34.1 g/dL (ref 30.0–36.0)
MCV: 91.2 fL (ref 80.0–100.0)
MCV: 87.7 fL (ref 80.0–100.0)
Platelets: 121 10*3/uL — ABNORMAL LOW (ref 150–400)
Platelets: 98 10*3/uL — ABNORMAL LOW (ref 150–400)
RBC: 3.98 MIL/uL (ref 3.87–5.11)
RBC: 3.74 MIL/uL — ABNORMAL LOW (ref 3.87–5.11)
RDW: 15 % (ref 11.5–15.5)
RDW: 14.6 % (ref 11.5–15.5)
WBC: 7.1 10*3/uL (ref 4.0–10.5)
WBC: 9.6 10*3/uL (ref 4.0–10.5)
nRBC: 1 % — ABNORMAL HIGH (ref 0.0–0.2)
nRBC: 1.1 % — ABNORMAL HIGH (ref 0.0–0.2)

## 2021-05-05 LAB — APTT: aPTT: 24 seconds (ref 24–36)

## 2021-05-05 LAB — GLUCOSE, PLEURAL OR PERITONEAL FLUID: Glucose, Fluid: 28 mg/dL

## 2021-05-05 LAB — MAGNESIUM: Magnesium: 3.1 mg/dL — ABNORMAL HIGH (ref 1.7–2.4)

## 2021-05-05 LAB — BASIC METABOLIC PANEL
Anion gap: 12 (ref 5–15)
BUN: 29 mg/dL — ABNORMAL HIGH (ref 8–23)
CO2: 24 mmol/L (ref 22–32)
Calcium: 8.4 mg/dL — ABNORMAL LOW (ref 8.9–10.3)
Chloride: 101 mmol/L (ref 98–111)
Creatinine, Ser: 1.85 mg/dL — ABNORMAL HIGH (ref 0.44–1.00)
GFR, Estimated: 27 mL/min — ABNORMAL LOW (ref >60)
Glucose, Bld: 101 mg/dL — ABNORMAL HIGH (ref 70–99)
Potassium: 4.1 mmol/L (ref 3.5–5.1)
Sodium: 137 mmol/L (ref 135–145)

## 2021-05-05 LAB — RENAL FUNCTION PANEL
Albumin: 2 g/dL — ABNORMAL LOW (ref 3.5–5.0)
Albumin: 2.1 g/dL — ABNORMAL LOW (ref 3.5–5.0)
Anion gap: 24 — ABNORMAL HIGH (ref 5–15)
Anion gap: 21 — ABNORMAL HIGH (ref 5–15)
BUN: 38 mg/dL — ABNORMAL HIGH (ref 8–23)
BUN: 31 mg/dL — ABNORMAL HIGH (ref 8–23)
CO2: 17 mmol/L — ABNORMAL LOW (ref 22–32)
CO2: 21 mmol/L — ABNORMAL LOW (ref 22–32)
Calcium: 8.7 mg/dL — ABNORMAL LOW (ref 8.9–10.3)
Calcium: 8.2 mg/dL — ABNORMAL LOW (ref 8.9–10.3)
Chloride: 98 mmol/L (ref 98–111)
Chloride: 96 mmol/L — ABNORMAL LOW (ref 98–111)
Creatinine, Ser: 3.11 mg/dL — ABNORMAL HIGH (ref 0.44–1.00)
Creatinine, Ser: 2.2 mg/dL — ABNORMAL HIGH (ref 0.44–1.00)
GFR, Estimated: 15 mL/min — ABNORMAL LOW (ref >60)
GFR, Estimated: 22 mL/min — ABNORMAL LOW (ref >60)
Glucose, Bld: 151 mg/dL — ABNORMAL HIGH (ref 70–99)
Glucose, Bld: 82 mg/dL (ref 70–99)
Phosphorus: 5.5 mg/dL — ABNORMAL HIGH (ref 2.5–4.6)
Phosphorus: 3 mg/dL (ref 2.5–4.6)
Potassium: 4.5 mmol/L (ref 3.5–5.1)
Potassium: 4 mmol/L (ref 3.5–5.1)
Sodium: 139 mmol/L (ref 135–145)
Sodium: 138 mmol/L (ref 135–145)

## 2021-05-05 LAB — BODY FLUID CELL COUNT WITH DIFFERENTIAL
Eos, Fluid: 0 %
Lymphs, Fluid: 10 %
Monocyte-Macrophage-Serous Fluid: 0 % — ABNORMAL LOW (ref 50–90)
Neutrophil Count, Fluid: 90 % — ABNORMAL HIGH (ref 0–25)
Total Nucleated Cell Count, Fluid: 21950 cu mm — ABNORMAL HIGH (ref 0–1000)

## 2021-05-05 LAB — DIC (DISSEMINATED INTRAVASCULAR COAGULATION)PANEL
D-Dimer, Quant: 11.23 ug/mL-FEU — ABNORMAL HIGH (ref 0.00–0.50)
Fibrinogen: 675 mg/dL — ABNORMAL HIGH (ref 210–475)
INR: 1.9 — ABNORMAL HIGH (ref 0.8–1.2)
Platelets: 133 10*3/uL — ABNORMAL LOW (ref 150–400)
Prothrombin Time: 21.6 seconds — ABNORMAL HIGH (ref 11.4–15.2)
Smear Review: NONE SEEN
aPTT: 31 seconds (ref 24–36)

## 2021-05-05 LAB — AMYLASE, PLEURAL OR PERITONEAL FLUID: Amylase, Fluid: 8852 U/L

## 2021-05-05 LAB — LACTATE DEHYDROGENASE, PLEURAL OR PERITONEAL FLUID: LD, Fluid: 3174 U/L — ABNORMAL HIGH (ref 3–23)

## 2021-05-05 LAB — GLUCOSE, CAPILLARY
Glucose-Capillary: 125 mg/dL — ABNORMAL HIGH (ref 70–99)
Glucose-Capillary: 70 mg/dL (ref 70–99)
Glucose-Capillary: 71 mg/dL (ref 70–99)
Glucose-Capillary: 99 mg/dL (ref 70–99)

## 2021-05-05 LAB — PREPARE RBC (CROSSMATCH)

## 2021-05-05 LAB — PROTEIN, PLEURAL OR PERITONEAL FLUID: Total protein, fluid: <3 g/dL

## 2021-05-05 LAB — ABO/RH: ABO/RH(D): A POS

## 2021-05-05 LAB — PROTIME-INR
INR: 1.9 — ABNORMAL HIGH (ref 0.8–1.2)
Prothrombin Time: 21.5 seconds — ABNORMAL HIGH (ref 11.4–15.2)

## 2021-05-05 SURGERY — THORACOTOMY, MAJOR
Anesthesia: General | Site: Chest | Laterality: Right

## 2021-05-05 MED ORDER — SODIUM CHLORIDE 0.9 % IV SOLN
100.0000 mg | Freq: Two times a day (BID) | INTRAVENOUS | Status: DC
  Administered 2021-05-05: 100 mg via INTRAVENOUS
  Filled 2021-05-05 (×2): qty 100

## 2021-05-05 MED ORDER — CHLORHEXIDINE GLUCONATE CLOTH 2 % EX PADS
6.0000 | MEDICATED_PAD | Freq: Every day | CUTANEOUS | Status: DC
  Administered 2021-05-07 – 2021-05-08 (×2): 6 via TOPICAL

## 2021-05-05 MED ORDER — IOPAMIDOL (ISOVUE-300) INJECTION 61%
50.0000 mL | Freq: Once | INTRAVENOUS | Status: AC | PRN
  Administered 2021-05-05: 50 mL via ORAL

## 2021-05-05 MED ORDER — VASOPRESSIN 20 UNIT/ML IV SOLN
INTRAVENOUS | Status: DC | PRN
  Administered 2021-05-05 (×4): 1 [IU] via INTRAVENOUS

## 2021-05-05 MED ORDER — ROCURONIUM BROMIDE 10 MG/ML (PF) SYRINGE
PREFILLED_SYRINGE | INTRAVENOUS | Status: DC | PRN
  Administered 2021-05-05: 60 mg via INTRAVENOUS

## 2021-05-05 MED ORDER — VASOPRESSIN 20 UNIT/ML IV SOLN
INTRAVENOUS | Status: AC
  Filled 2021-05-05: qty 1

## 2021-05-05 MED ORDER — CHLORHEXIDINE GLUCONATE 0.12% ORAL RINSE (MEDLINE KIT)
15.0000 mL | Freq: Two times a day (BID) | OROMUCOSAL | Status: DC
  Administered 2021-05-05 – 2021-05-06 (×3): 15 mL via OROMUCOSAL

## 2021-05-05 MED ORDER — FENTANYL CITRATE (PF) 250 MCG/5ML IJ SOLN
INTRAMUSCULAR | Status: DC | PRN
  Administered 2021-05-05: 50 ug via INTRAVENOUS
  Administered 2021-05-05: 150 ug via INTRAVENOUS

## 2021-05-05 MED ORDER — ONDANSETRON HCL 4 MG/2ML IJ SOLN
INTRAMUSCULAR | Status: DC | PRN
  Administered 2021-05-05: 4 mg via INTRAVENOUS

## 2021-05-05 MED ORDER — PIPERACILLIN-TAZOBACTAM IN DEX 2-0.25 GM/50ML IV SOLN
2.2500 g | Freq: Three times a day (TID) | INTRAVENOUS | Status: DC
  Administered 2021-05-06 – 2021-05-07 (×4): 2.25 g via INTRAVENOUS
  Filled 2021-05-05 (×5): qty 50

## 2021-05-05 MED ORDER — ORAL CARE MOUTH RINSE
15.0000 mL | OROMUCOSAL | Status: DC
  Administered 2021-05-05 – 2021-05-07 (×14): 15 mL via OROMUCOSAL

## 2021-05-05 MED ORDER — PHENYLEPHRINE 40 MCG/ML (10ML) SYRINGE FOR IV PUSH (FOR BLOOD PRESSURE SUPPORT)
PREFILLED_SYRINGE | INTRAVENOUS | Status: AC
  Filled 2021-05-05: qty 10

## 2021-05-05 MED ORDER — SODIUM CHLORIDE 0.9 % IV SOLN
100.0000 mg | INTRAVENOUS | Status: AC
  Administered 2021-05-06 – 2021-05-24 (×19): 100 mg via INTRAVENOUS
  Filled 2021-05-05 (×20): qty 100

## 2021-05-05 MED ORDER — FENTANYL CITRATE (PF) 250 MCG/5ML IJ SOLN
INTRAMUSCULAR | Status: AC
  Filled 2021-05-05: qty 5

## 2021-05-05 MED ORDER — PHENYLEPHRINE 40 MCG/ML (10ML) SYRINGE FOR IV PUSH (FOR BLOOD PRESSURE SUPPORT)
PREFILLED_SYRINGE | INTRAVENOUS | Status: DC | PRN
  Administered 2021-05-05: 120 ug via INTRAVENOUS

## 2021-05-05 MED ORDER — ONDANSETRON HCL 4 MG/2ML IJ SOLN
INTRAMUSCULAR | Status: AC
  Filled 2021-05-05: qty 2

## 2021-05-05 MED ORDER — CHLORHEXIDINE GLUCONATE CLOTH 2 % EX PADS: 6.0000 | MEDICATED_PAD | Freq: Every day | CUTANEOUS | Status: DC

## 2021-05-05 MED ORDER — PROPOFOL 10 MG/ML IV BOLUS
INTRAVENOUS | Status: AC
  Filled 2021-05-05: qty 20

## 2021-05-05 MED ORDER — SODIUM CHLORIDE 0.9 % IV SOLN
200.0000 mg | Freq: Once | INTRAVENOUS | Status: AC
  Administered 2021-05-05: 200 mg via INTRAVENOUS
  Filled 2021-05-05: qty 200

## 2021-05-05 MED ORDER — ONDANSETRON HCL 4 MG/2ML IJ SOLN
4.0000 mg | Freq: Four times a day (QID) | INTRAMUSCULAR | Status: DC | PRN
  Administered 2021-05-24: 4 mg via INTRAVENOUS
  Filled 2021-05-05 (×2): qty 2

## 2021-05-05 MED ORDER — ALBUTEROL SULFATE HFA 108 (90 BASE) MCG/ACT IN AERS
INHALATION_SPRAY | RESPIRATORY_TRACT | Status: DC | PRN
  Administered 2021-05-05 (×2): 4 via RESPIRATORY_TRACT

## 2021-05-05 MED ORDER — DEXAMETHASONE SODIUM PHOSPHATE 10 MG/ML IJ SOLN
INTRAMUSCULAR | Status: DC | PRN
  Administered 2021-05-05: 5 mg via INTRAVENOUS

## 2021-05-05 MED ORDER — SODIUM CHLORIDE 0.9% FLUSH
10.0000 mL | Freq: Two times a day (BID) | INTRAVENOUS | Status: DC
  Administered 2021-05-05 – 2021-05-07 (×3): 10 mL

## 2021-05-05 MED ORDER — SODIUM CHLORIDE 0.9 % IV SOLN
INTRAVENOUS | Status: DC | PRN
Start: 2021-05-05 — End: 2021-05-05

## 2021-05-05 MED ORDER — INSULIN ASPART 100 UNIT/ML IJ SOLN
0.0000 [IU] | INTRAMUSCULAR | Status: DC
  Administered 2021-05-05: 2 [IU] via SUBCUTANEOUS
  Administered 2021-05-06: 8 [IU] via SUBCUTANEOUS
  Administered 2021-05-06: 12 [IU] via SUBCUTANEOUS
  Administered 2021-05-06: 2 [IU] via SUBCUTANEOUS
  Administered 2021-05-06: 8 [IU] via SUBCUTANEOUS
  Administered 2021-05-07: 2 [IU] via SUBCUTANEOUS
  Administered 2021-05-07: 4 [IU] via SUBCUTANEOUS
  Administered 2021-05-07: 2 [IU] via SUBCUTANEOUS
  Administered 2021-05-07: 4 [IU] via SUBCUTANEOUS
  Administered 2021-05-07: 2 [IU] via SUBCUTANEOUS
  Administered 2021-05-08: 8 [IU] via SUBCUTANEOUS
  Administered 2021-05-08: 4 [IU] via SUBCUTANEOUS
  Administered 2021-05-08: 8 [IU] via SUBCUTANEOUS
  Administered 2021-05-08 – 2021-05-09 (×5): 4 [IU] via SUBCUTANEOUS
  Administered 2021-05-09: 8 [IU] via SUBCUTANEOUS
  Administered 2021-05-09 – 2021-05-10 (×5): 4 [IU] via SUBCUTANEOUS
  Administered 2021-05-10: 2 [IU] via SUBCUTANEOUS
  Administered 2021-05-10 – 2021-05-11 (×3): 4 [IU] via SUBCUTANEOUS
  Administered 2021-05-11: 2 [IU] via SUBCUTANEOUS
  Administered 2021-05-11 – 2021-05-12 (×5): 4 [IU] via SUBCUTANEOUS
  Administered 2021-05-12: 2 [IU] via SUBCUTANEOUS
  Administered 2021-05-12 (×4): 4 [IU] via SUBCUTANEOUS
  Administered 2021-05-13 (×3): 2 [IU] via SUBCUTANEOUS

## 2021-05-05 MED ORDER — SODIUM CHLORIDE 0.9% FLUSH: 10.0000 mL | INTRAVENOUS | Status: DC | PRN

## 2021-05-05 MED ORDER — SODIUM CHLORIDE 0.9% IV SOLUTION: Freq: Once | INTRAVENOUS | Status: DC

## 2021-05-05 MED ORDER — SODIUM CHLORIDE 0.9 % IV SOLN: INTRAVENOUS | Status: DC | PRN

## 2021-05-05 MED ORDER — CHLORHEXIDINE GLUCONATE CLOTH 2 % EX PADS
6.0000 | MEDICATED_PAD | Freq: Every day | CUTANEOUS | Status: DC
  Administered 2021-05-05 – 2021-05-08 (×3): 6 via TOPICAL

## 2021-05-05 MED ORDER — HEPARIN SODIUM (PORCINE) 5000 UNIT/ML IJ SOLN: 5000.0000 [IU] | Freq: Three times a day (TID) | INTRAMUSCULAR | Status: DC

## 2021-05-05 MED ORDER — DEXTROSE-NACL 5-0.9 % IV SOLN: INTRAVENOUS | Status: AC

## 2021-05-05 MED ORDER — ALBUMIN HUMAN 5 % IV SOLN: INTRAVENOUS | Status: DC | PRN

## 2021-05-05 MED ORDER — 0.9 % SODIUM CHLORIDE (POUR BTL) OPTIME
TOPICAL | Status: DC | PRN
  Administered 2021-05-05: 3000 mL

## 2021-05-05 MED ORDER — CALCIUM CHLORIDE 10 % IV SOLN
INTRAVENOUS | Status: DC | PRN
  Administered 2021-05-05 (×3): 200 mg via INTRAVENOUS
  Administered 2021-05-05: 300 mg via INTRAVENOUS
  Administered 2021-05-05: 100 mg via INTRAVENOUS

## 2021-05-05 SURGICAL SUPPLY — 56 items
ADH SKN CLS APL DERMABOND .7 (GAUZE/BANDAGES/DRESSINGS)
BLADE CLIPPER SURG (BLADE) ×2 IMPLANT
CANISTER SUCT 3000ML PPV (MISCELLANEOUS) ×4 IMPLANT
CATH THORACIC 28FR (CATHETERS) ×2 IMPLANT
CLIP VESOCCLUDE MED 6/CT (CLIP) ×2 IMPLANT
CLIP VESOCCLUDE SM WIDE 6/CT (CLIP) ×2 IMPLANT
CONN ST 1/4X3/8  BEN (MISCELLANEOUS) ×4
CONN ST 1/4X3/8 BEN (MISCELLANEOUS) ×2 IMPLANT
DERMABOND ADVANCED (GAUZE/BANDAGES/DRESSINGS)
DERMABOND ADVANCED .7 DNX12 (GAUZE/BANDAGES/DRESSINGS) IMPLANT
DRAIN CHANNEL 32F RND 10.7 FF (WOUND CARE) ×4 IMPLANT
DRSG COVADERM 4X14 (GAUZE/BANDAGES/DRESSINGS) ×2 IMPLANT
ELECT REM PT RETURN 9FT ADLT (ELECTROSURGICAL) ×2
ELECTRODE REM PT RTRN 9FT ADLT (ELECTROSURGICAL) ×1 IMPLANT
FELT TEFLON 1X6 (MISCELLANEOUS) ×2 IMPLANT
GAUZE SPONGE 4X4 12PLY STRL (GAUZE/BANDAGES/DRESSINGS) ×2 IMPLANT
GAUZE SPONGE 4X4 12PLY STRL LF (GAUZE/BANDAGES/DRESSINGS) ×2 IMPLANT
GLOVE SURG MICRO LTX SZ6 (GLOVE) ×4 IMPLANT
GLOVE SURG MICRO LTX SZ6.5 (GLOVE) ×4 IMPLANT
GLOVE SURG MICRO LTX SZ7 (GLOVE) ×4 IMPLANT
GLOVE SURG POLYISO LF SZ6.5 (GLOVE) ×8 IMPLANT
GOWN STRL REUS W/ TWL LRG LVL3 (GOWN DISPOSABLE) ×2 IMPLANT
GOWN STRL REUS W/ TWL XL LVL3 (GOWN DISPOSABLE) ×1 IMPLANT
GOWN STRL REUS W/TWL LRG LVL3 (GOWN DISPOSABLE) ×4
GOWN STRL REUS W/TWL XL LVL3 (GOWN DISPOSABLE) ×2
KIT BASIN OR (CUSTOM PROCEDURE TRAY) ×2 IMPLANT
KIT TURNOVER KIT B (KITS) ×2 IMPLANT
NS IRRIG 1000ML POUR BTL (IV SOLUTION) ×6 IMPLANT
PACK CHEST (CUSTOM PROCEDURE TRAY) ×2 IMPLANT
PACK UNIVERSAL I (CUSTOM PROCEDURE TRAY) ×2 IMPLANT
PAD ARMBOARD 7.5X6 YLW CONV (MISCELLANEOUS) ×4 IMPLANT
PASSER SUT SWANSON 36MM LOOP (INSTRUMENTS) ×2 IMPLANT
STAPLER VISISTAT 35W (STAPLE) ×2 IMPLANT
SUT PROLENE 3 0 SH DA (SUTURE) ×2 IMPLANT
SUT PROLENE 4 0 RB 1 (SUTURE) ×2
SUT PROLENE 4 0 SH DA (SUTURE) ×2 IMPLANT
SUT PROLENE 4-0 RB1 .5 CRCL 36 (SUTURE) ×1 IMPLANT
SUT SILK  1 MH (SUTURE) ×6
SUT SILK 1 MH (SUTURE) ×3 IMPLANT
SUT SILK 2 0SH CR/8 30 (SUTURE) ×2 IMPLANT
SUT SILK 3 0SH CR/8 30 (SUTURE) IMPLANT
SUT VIC AB 1 CTX 36 (SUTURE) ×8
SUT VIC AB 1 CTX36XBRD ANBCTR (SUTURE) ×4 IMPLANT
SUT VIC AB 2-0 CT1 27 (SUTURE) ×6
SUT VIC AB 2-0 CT1 TAPERPNT 27 (SUTURE) ×3 IMPLANT
SUT VIC AB 2-0 CTX 36 (SUTURE) ×2 IMPLANT
SUT VIC AB 2-0 UR6 27 (SUTURE) IMPLANT
SUT VIC AB 3-0 MH 27 (SUTURE) IMPLANT
SUT VIC AB 3-0 SH 27 (SUTURE) ×8
SUT VIC AB 3-0 SH 27X BRD (SUTURE) ×4 IMPLANT
SUT VIC AB 3-0 X1 27 (SUTURE) ×2 IMPLANT
SUT VICRYL 2 TP 1 (SUTURE) ×4 IMPLANT
SYSTEM SAHARA CHEST DRAIN ATS (WOUND CARE) ×2 IMPLANT
TIP APPLICATOR SPRAY EXTEND 16 (VASCULAR PRODUCTS) IMPLANT
TOWEL GREEN STERILE (TOWEL DISPOSABLE) ×4 IMPLANT
WATER STERILE IRR 1000ML POUR (IV SOLUTION) ×4 IMPLANT

## 2021-05-05 NOTE — Anesthesia Preprocedure Evaluation (Addendum)
Anesthesia Evaluation  Patient identified by MRN, date of birth, ID band Patient unresponsive    Reviewed: Allergy & Precautions, Patient's Chart, lab work & pertinent test results  Airway Mallampati: Intubated       Dental   Pulmonary  Intubated with 7.5 oral ETT      + intubated    Cardiovascular hypertension, Pt. on medications + CAD and + Past MI   Rhythm:Regular Rate:Normal  ECG: a-fib, rate 54   Neuro/Psych PSYCHIATRIC DISORDERS Depression  Neuromuscular disease    GI/Hepatic Neg liver ROS, GERD  Medicated,  Endo/Other  diabetes, Insulin Dependent  Renal/GU Renal disease     Musculoskeletal  (+) Arthritis ,   Abdominal   Peds  Hematology  (+) anemia , HLD   Anesthesia Other Findings Perforated Esophagus  Reproductive/Obstetrics                            Anesthesia Physical Anesthesia Plan  ASA: 4  Anesthesia Plan: General   Post-op Pain Management:    Induction: Intravenous  PONV Risk Score and Plan: 3 and Ondansetron, Dexamethasone and Treatment may vary due to age or medical condition  Airway Management Planned: Oral ETT, Video Laryngoscope Planned and Double Lumen EBT  Additional Equipment: Arterial line  Intra-op Plan:   Post-operative Plan: Post-operative intubation/ventilation  Informed Consent: I have reviewed the patients History and Physical, chart, labs and discussed the procedure including the risks, benefits and alternatives for the proposed anesthesia with the patient or authorized representative who has indicated his/her understanding and acceptance.     Consent reviewed with POA  Plan Discussed with: CRNA  Anesthesia Plan Comments:        Anesthesia Quick Evaluation

## 2021-05-05 NOTE — Brief Op Note (Signed)
05/04/2021 - 05/05/2021  6:39 PM  PATIENT:  Dana Coleman  80 y.o. female  PRE-OPERATIVE DIAGNOSIS:  Perforated Esophagus  POST-OPERATIVE DIAGNOSIS:  Perforated Esophagus  PROCEDURE:  Procedure(s): RIGHT THORACOTOMY MAJOR REPAIR PERFORATED ESOPHAGUS (Right)  SURGEON:  Surgeon(s) and Role:    * Shineka Auble, Fernande Boyden, MD - Primary  PHYSICIAN ASSISTANT: none  ASSISTANTS: RNFA   ANESTHESIA:   general  EBL:  400 mL   BLOOD ADMINISTERED:none  DRAINS:  55F posterior chest tube, two 32 Blake drains along posterior mediastinum and costophrenic sulcus    LOCAL MEDICATIONS USED:  NONE  SPECIMEN:  Source of Specimen:  Pleural peel for culture  DISPOSITION OF SPECIMEN:   micro  COUNTS:  YES  TOURNIQUET:  * No tourniquets in log *  DICTATION: .Note written in EPIC  PLAN OF CARE: Admit to inpatient   PATIENT DISPOSITION:  ICU - intubated and critically ill.   Delay start of Pharmacological VTE agent (>24hrs) due to surgical blood loss or risk of bleeding: yes

## 2021-05-05 NOTE — Anesthesia Procedure Notes (Signed)
Procedure Name: Intubation Date/Time: 05/05/2021 6:58 PM Performed by: Oletta Lamas, CRNA Pre-anesthesia Checklist: Patient identified, Emergency Drugs available, Suction available and Patient being monitored Patient Re-evaluated:Patient Re-evaluated prior to induction Oxygen Delivery Method: Circle System Utilized Preoxygenation: Pre-oxygenation with 100% oxygen Laryngoscope Size: Glidescope Grade View: Grade I Tube type: Oral Tube size: 7.5 mm Number of attempts: 1 Airway Equipment and Method: Bougie stylet and Video-laryngoscopy Placement Confirmation: ETT inserted through vocal cords under direct vision, positive ETCO2 and breath sounds checked- equal and bilateral Secured at: 22 cm Tube secured with: Tape Dental Injury: Teeth and Oropharynx as per pre-operative assessment  Comments: Double lumen ETT exchanged for single lumen tube over cook catheter under glidescope visualization.  Tolerated well without incident.

## 2021-05-05 NOTE — Anesthesia Procedure Notes (Signed)
Arterial Line Insertion Start/End7/24/2022 3:00 PM, 05/05/2021 3:10 PM Performed by: Oletta Lamas, CRNA, CRNA  Preanesthetic checklist: patient identified, IV checked, site marked, risks and benefits discussed, surgical consent, monitors and equipment checked, pre-op evaluation, timeout performed and anesthesia consent Right, radial was placed Catheter size: 20 G  Attempts: 1 Procedure performed using ultrasound guided technique. Ultrasound Notes:anatomy identified, needle tip was noted to be adjacent to the nerve/plexus identified and no ultrasound evidence of intravascular and/or intraneural injection Following insertion, dressing applied and Biopatch. Post procedure assessment: normal  Patient tolerated the procedure well with no immediate complications.

## 2021-05-05 NOTE — Anesthesia Procedure Notes (Signed)
Procedure Name: Intubation Date/Time: 05/05/2021 3:15 PM Performed by: Inda Coke, CRNA Pre-anesthesia Checklist: Patient identified, Emergency Drugs available, Suction available and Patient being monitored Patient Re-evaluated:Patient Re-evaluated prior to induction Oxygen Delivery Method: Circle System Utilized Preoxygenation: Pre-oxygenation with 100% oxygen Induction Type: IV induction Laryngoscope Size: Glidescope and 3 Grade View: Grade I Tube type: Oral Endobronchial tube: Left, Double lumen EBT, EBT position confirmed by auscultation and EBT position confirmed by fiberoptic bronchoscope and 35 Fr Number of attempts: 1 Airway Equipment and Method: Oral airway, Fiberoptic brochoscope, Bougie stylet and Video-laryngoscopy (Cook catheter utilized) Placement Confirmation: ETT inserted through vocal cords under direct vision, positive ETCO2 and breath sounds checked- equal and bilateral Secured at: 28 cm Tube secured with: Tape Dental Injury: Teeth and Oropharynx as per pre-operative assessment

## 2021-05-05 NOTE — Consult Note (Signed)
Dana Coleman       [place] [address]             [id number]      Cardiothoracic Surgery Consultation  Reason for Consult: Esophageal perforation Referring Physician: Dr. Ina Homes  Dana Coleman is an 80 y.o. female.  HPI:  The patient is a 80 year old woman with a history of hypertension, hyperlipidemia, diabetes, MI in 2004 with a low risk nuclear scan in March 2019 and ejection fraction of 55 to 65% at that time who underwent laparoscopic cholecystectomy by Dr. Greer Pickerel on 05/02/2021.  That was an uncomplicated procedure and patient was discharged home.  She returned the emergency room yesterday with complaints of altered mental status noted by her family as well as abdominal discomfort and then she became unresponsive and slumped over the table.  She was found by EMS to be hypotensive and bradycardic with hypoxemia and sats in the 70s.  She was intubated.  Chest x-ray on presentation showed a large loculated right pleural effusion.  She was seen by general surgery and had a pigtail catheter placed in the right pleural space by general surgery and the drainage from the pigtail catheter.  Similar to the NG drainage.  She was admitted to [facility].  Her creatinine on presentation was 4.07 compared to her normal at 0.88.  Electrolytes were normal with a venous lactic acid of  >11.  White blood cell count was 12.3.  She was started on CRRT for acute renal failure although she was still making some urine.  Her oxygenation improved overnight and Levophed was weaned.  CT scan of the chest and abdomen/pelvis this morning showed a loculated right hydropneumothorax with about 40% pneumothorax component.  There were air bubbles within the loculated pleural fluid.  The right pigtail catheter was lying within the fluid.  There is consolidation and atelectasis in the right lung involving the entire right lower lobe.  Nasogastric tube was in the distal esophagus.  She was taken back  down to radiology later this morning for an esophagram through the NG tube.  This showed extraluminal contrast leaking from the midportion of the esophagus on the posterior right aspect of the esophagus about 1 to 2 cm distal to the carina.  The extraluminal contrast tract caudally for about 4-5 cm.  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   Hyperlipidemia    Hypertension    Personal history of radiation therapy 2009    Past Surgical History:  Procedure Laterality Date   BREAST LUMPECTOMY Right 2009   BREAST SURGERY  2009   right lumpectomy   [place] N/A 05/02/2021   Procedure: LAPAROSCOPIC CHOLECYSTECTOMY;  Surgeon: Greer Pickerel, MD;  Location: [facility];  Service: General;  Laterality: N/A;   COLONOSCOPY     LEFT HEART CATHETERIZATION WITH CORONARY ANGIOGRAM N/A 04/20/2014   Procedure: LEFT HEART CATHETERIZATION WITH CORONARY ANGIOGRAM;  Surgeon: Jacolyn Reedy, MD;  Location: [facility] CATH LAB;  Service: Cardiovascular;  Laterality: N/A;   POLYPECTOMY     STENT PLACEMENT VASCULAR ([place] HX)     thyroid     for nodule hemithyroidectomy, benign   THYROIDECTOMY Right 03/2003    Family History  Problem Relation Age of Onset   Heart disease Mother        age 54   Heart disease Father    Cancer Father        unknown   Heart disease Brother    Hypertension Brother    Diabetes Daughter    Stroke Daughter    Cirrhosis Brother    Colon cancer Neg Hx    Colon polyps Neg Hx    Esophageal cancer Neg Hx    Stomach cancer Neg Hx    Rectal cancer Neg Hx     Social History:  reports that she has never smoked. She has never used  smokeless tobacco. She reports that she does not drink alcohol and does not use drugs.  Allergies: No Known Allergies  Medications: I have reviewed the patient's current medications. Prior to Admission:  Medications Prior to Admission  Medication Sig Dispense Refill Last Dose   acetaminophen (TYLENOL) 500 MG tablet Take 2 tablets (1,000 mg total) by mouth every 8 (eight) hours for 5 days. 30 tablet 0    amLODipine (NORVASC) 10 MG tablet TAKE 1 TABLET BY MOUTH EVERY DAY 90 tablet 3    aspirin EC 81 MG tablet Take 81 mg by mouth in the morning. Swallow whole.      Blood Glucose Monitoring Suppl MISC One touch ultra glucose monitor. Check blood sugar twice a day.      Cholecalciferol (VITAMIN D3) 50 MCG (2000 UT) TABS Take 2,000 Units by mouth in the morning.      glucose blood (ONE TOUCH ULTRA TEST) test strip CHECK BLOOD SUGAR TWICE DAILY 100 each 1    hydrochlorothiazide (HYDRODIURIL) 12.5 MG tablet TAKE 1 TABLET BY MOUTH EVERY DAY 90 tablet 3    Insulin Pen Needle (B-D UF III MINI PEN NEEDLES) 31G X 5 MM MISC INJECT INSULIN VIA PEN 6 TIMES DAILY 200 each 3    JARDIANCE 10 MG TABS tablet TAKE 1 TABLET BY MOUTH EVERY DAY 90 tablet 3    LANTUS SOLOSTAR 100 UNIT/ML Solostar Pen INJECT 24 UNITS INTO THE SKIN EVERY MORNING. 3 mL 9    losartan (COZAAR) 50 MG tablet Take 50 mg by mouth in the morning.      metFORMIN (GLUCOPHAGE-XR) 500 MG 24 hr tablet TAKE 1 TABLET BY MOUTH TWICE A DAY WITH BREAKFAST AND DINNER 180 tablet 0    nitroGLYCERIN (NITROSTAT) 0.4 MG SL tablet Place 1 tablet (0.4 mg total) under the tongue every 5 (five) minutes as needed for chest pain. (Patient taking differently: Place 0.4 mg under the tongue every 5 (five) minutes x 3 doses as needed for chest pain.) 25 tablet 1    omeprazole (PRILOSEC) 40 MG capsule Take 40 mg by mouth every morning.      OneTouch Delica Lancets 93X MISC 1 Container by Does not apply route as needed. 100 each PRN    rosuvastatin (CRESTOR) 20 MG tablet  TAKE 1 TABLET BY MOUTH EVERYDAY AT BEDTIME (Patient taking differently: Take 20 mg by mouth at bedtime.) 90 tablet 3    traMADol (ULTRAM) 50 MG tablet Take 1 tablet (50 mg total) by mouth every 6 (six) hours as needed for up to 5 days. 15 tablet 0    Scheduled:  albuterol  10 mg Nebulization Once   chlorhexidine gluconate (MEDLINE KIT)  15 mL Mouth Rinse BID   Chlorhexidine Gluconate Cloth  6 each Topical Daily   fentaNYL (SUBLIMAZE) injection  25 mcg Intravenous Once   heparin injection (subcutaneous)  5,000 Units Subcutaneous Q8H   insulin aspart  0-15 Units Subcutaneous Q4H   mouth rinse  15 mL Mouth Rinse 10 times per day   pantoprazole (PROTONIX) IV  40 mg Intravenous QHS   Continuous:  sodium chloride 10 mL/hr at 05/05/21 1000   anidulafungin     Followed by   Derrill Memo ON 05/06/2021] anidulafungin     fentaNYL infusion INTRAVENOUS 50 mcg/hr (05/05/21 1000)   norepinephrine (LEVOPHED) Adult infusion 5 mcg/min (05/05/21 1043)   piperacillin-tazobactam 3.375 g (05/05/21 1014)   prismasol BGK 4/2.5 1,800 mL/hr at 05/05/21 0342   sodium bicarbonate (isotonic) 1000 mL infusion 150 mL/hr at 05/05/21 7482   sodium bicarbonate (isotonic) 1000 mL infusion 100 mL/hr at 05/05/21 0342   LMB:EMLJQG chloride, alteplase, docusate, fentaNYL, heparin, polyethylene glycol, sodium chloride Anti-infectives (From admission, onward)    Start     Dose/Rate Route Frequency Ordered Stop   05/06/21 1245  anidulafungin (ERAXIS) 100 mg in sodium chloride 0.9 % 100 mL IVPB       See Hyperspace for full Linked Orders Report.   100 mg 78 mL/hr over 100 Minutes Intravenous Every 24 hours 05/05/21 1145     05/05/21 1245  anidulafungin (ERAXIS) 200 mg in sodium chloride 0.9 % 200 mL IVPB       See Hyperspace for full Linked Orders Report.   200 mg 78 mL/hr over 200 Minutes Intravenous  Once 05/05/21 1145     05/05/21 1000  piperacillin-tazobactam (ZOSYN) IVPB 3.375 g  Status:  Discontinued        3.375 g 12.5  mL/hr over 240 Minutes Intravenous Every 12 hours 05/04/21 2021 05/04/21 2334   05/05/21 0400  piperacillin-tazobactam (ZOSYN) IVPB 3.375 g        3.375 g 100 mL/hr over 30 Minutes Intravenous Every 6 hours 05/04/21 2335     05/05/21 0100  doxycycline (VIBRAMYCIN) 100 mg in sodium chloride 0.9 % 250 mL IVPB  Status:  Discontinued        100 mg 125 mL/hr over 120 Minutes Intravenous Every 12 hours 05/05/21 0003 05/05/21 1158   05/04/21 2115  piperacillin-tazobactam (ZOSYN) IVPB 3.375 g        3.375 g 100 mL/hr over 30 Minutes Intravenous STAT 05/04/21 2016 05/04/21 2153       Results for orders placed or performed during the hospital encounter of 05/04/21 (from the past 48 hour(s))  Resp Panel by RT-PCR (Flu A&B, Covid) Nasopharyngeal Swab     Status: None   Collection Time: 05/04/21  4:22 PM   Specimen: Nasopharyngeal Swab; Nasopharyngeal(NP) swabs in vial transport medium  Result Value Ref Range   SARS Coronavirus 2 by RT PCR NEGATIVE NEGATIVE    Comment: (NOTE) SARS-CoV-2 target nucleic acids are NOT DETECTED.  The SARS-CoV-2 RNA is generally detectable in upper respiratory specimens during the acute phase of infection. The lowest concentration of SARS-CoV-2 viral copies this assay can detect is 138 copies/mL. A negative result does not preclude SARS-Cov-2 infection and should not be used as the sole basis for treatment or other patient management decisions. A negative result may occur with  improper specimen collection/handling, submission of specimen other than nasopharyngeal swab, presence of viral mutation(s) within the areas targeted by  this assay, and inadequate number of viral copies(<138 copies/mL). A negative result must be combined with clinical observations, patient history, and epidemiological information. The expected result is Negative.  Fact Sheet for Patients:  EntrepreneurPulse.com.au  Fact Sheet for Healthcare Providers:   IncredibleEmployment.be  This test is no t yet approved or cleared by the [place] FDA and  has been authorized for detection and/or diagnosis of SARS-CoV-2 by FDA under an Emergency Use Authorization (EUA). This EUA will remain  in effect (meaning this test can be used) for the duration of the COVID-19 declaration under Section 564(b)(1) of the Act, 21 U.S.C.section 360bbb-3(b)(1), unless the authorization is terminated  or revoked sooner.       Influenza A by PCR NEGATIVE NEGATIVE   Influenza B by PCR NEGATIVE NEGATIVE    Comment: (NOTE) The Xpert Xpress SARS-CoV-2/FLU/RSV plus assay is intended as an aid in the diagnosis of influenza from Nasopharyngeal swab specimens and should not be used as a sole basis for treatment. Nasal washings and aspirates are unacceptable for Xpert Xpress SARS-CoV-2/FLU/RSV testing.  Fact Sheet for Patients: EntrepreneurPulse.com.au  Fact Sheet for Healthcare Providers: IncredibleEmployment.be  This test is not yet approved or cleared by the [place] FDA and has been authorized for detection and/or diagnosis of SARS-CoV-2 by FDA under an Emergency Use Authorization (EUA). This EUA will remain in effect (meaning this test can be used) for the duration of the COVID-19 declaration under Section 564(b)(1) of the Act, 21 U.S.C. section 360bbb-3(b)(1), unless the authorization is terminated or revoked.  Performed at [facility], [place] [address], [place], [place] [address]   Comprehensive metabolic panel     Status: Abnormal   Collection Time: 05/04/21  4:32 PM  Result Value Ref Range   Sodium 140 135 - 145 mmol/L   Potassium 4.4 3.5 - 5.1 mmol/L   Chloride 106 98 - 111 mmol/L   CO2 8 (L) 22 - 32 mmol/L   Glucose, Bld 118 (H) 70 - 99 mg/dL    Comment: Glucose reference range applies only to samples taken after fasting for at least 8 hours.   BUN 38 (H) 8 - 23 mg/dL    Creatinine, Ser 4.07 (H) 0.44 - 1.00 mg/dL   Calcium 10.0 8.9 - 10.3 mg/dL   Total Protein 5.2 (L) 6.5 - 8.1 g/dL   Albumin 2.1 (L) 3.5 - 5.0 g/dL   AST 138 (H) 15 - 41 U/L   ALT 97 (H) 0 - 44 U/L    Comment: RESULTS CONFIRMED BY MANUAL DILUTION   Alkaline Phosphatase 49 38 - 126 U/L   Total Bilirubin 0.8 0.3 - 1.2 mg/dL   GFR, Estimated 11 (L) >60 mL/min    Comment: (NOTE) Calculated using the CKD-EPI Creatinine Equation (2021)    Anion gap 26 (H) 5 - 15    Comment: REPEATED TO VERIFY Performed at [facility], [address], [place],  [address]   CBC with Differential     Status: Abnormal   Collection Time: 05/04/21  4:32 PM  Result Value Ref Range   WBC 12.3 (H) 4.0 - 10.5 K/uL   RBC 3.58 (L) 3.87 - 5.11 MIL/uL   Hemoglobin 10.8 (L) 12.0 - 15.0 g/dL   HCT 37.3 36.0 - 46.0 %   MCV 104.2 (H) 80.0 - 100.0 fL   MCH 30.2 26.0 - 34.0 pg   MCHC 29.0 (L) 30.0 - 36.0 g/dL   RDW 15.5 11.5 - 15.5 %  Platelets 168 150 - 400 K/uL   nRBC 0.4 (H) 0.0 - 0.2 %   Neutrophils Relative % 76 %   Neutro Abs 9.5 (H) 1.7 - 7.7 K/uL   Lymphocytes Relative 9 %   Lymphs Abs 1.1 0.7 - 4.0 K/uL   Monocytes Relative 12 %   Monocytes Absolute 1.5 (H) 0.1 - 1.0 K/uL   Eosinophils Relative 0 %   Eosinophils Absolute 0.0 0.0 - 0.5 K/uL   Basophils Relative 1 %   Basophils Absolute 0.1 0.0 - 0.1 K/uL   Immature Granulocytes 2 %   Abs Immature Granulocytes 0.19 (H) 0.00 - 0.07 K/uL   Burr Cells PRESENT     Comment: Performed at [facility], [place] [address] [place], [place] [address]  Brain natriuretic peptide     Status: Abnormal   Collection Time: 05/04/21  4:32 PM  Result Value Ref Range   B Natriuretic Peptide 2,140.9 (H) 0.0 - 100.0 pg/mL    Comment: Performed at [place] [address], [place], [place] [address]  Lactic acid, plasma     Status: Abnormal   Collection Time: 05/04/21  4:37 PM  Result Value Ref Range   Lactic Acid, Venous >11.0 (HH) 0.5 - 1.9  mmol/L    Comment: CRITICAL RESULT CALLED TO, READ BACK BY AND VERIFIED WITH: M.COCHREN RN @ 9407 05/04/2021 BY C.EDENS Performed at [facility], [place] [address], [place], [place] [address]   Urinalysis, Routine w reflex microscopic     Status: Abnormal   Collection Time: 05/04/21  4:37 PM  Result Value Ref Range   Color, Urine AMBER (A) YELLOW    Comment: BIOCHEMICALS MAY BE AFFECTED BY COLOR   APPearance CLOUDY (A) CLEAR   Specific Gravity, Urine 1.024 1.005 - 1.030   pH 5.0 5.0 - 8.0   Glucose, UA >=500 (A) NEGATIVE mg/dL   Hgb urine dipstick NEGATIVE NEGATIVE   Bilirubin Urine NEGATIVE NEGATIVE   Ketones, ur NEGATIVE NEGATIVE mg/dL   Protein, ur NEGATIVE NEGATIVE mg/dL   Nitrite NEGATIVE NEGATIVE   Leukocytes,Ua NEGATIVE NEGATIVE   RBC / HPF 0-5 0 - 5 RBC/hpf   WBC, UA 6-10 0 - 5 WBC/hpf   Bacteria, UA RARE (A) NONE SEEN   Squamous Epithelial / LPF 0-5 0 - 5   Mucus PRESENT    Hyaline Casts, UA PRESENT    Non Squamous Epithelial 0-5 (A) NONE SEEN    Comment: Performed at [facility], [place] [address], [place], [place] [address]  I-Stat venous blood gas, ED     Status: Abnormal   Collection Time: 05/04/21  4:43 PM  Result Value Ref Range   pH, Ven 7.072 (LL) 7.250 - 7.430   pCO2, Ven 26.1 (L) 44.0 - 60.0 mmHg   pO2, Ven 120.0 (H) 32.0 - 45.0 mmHg   Bicarbonate 7.6 (L) 20.0 - 28.0 mmol/L   TCO2 8 (L) 22 - 32 mmol/L   O2 Saturation 97.0 %   Acid-base deficit 21.0 (H) 0.0 - 2.0 mmol/L   Sodium 132 (L) 135 - 145 mmol/L   Potassium 8.0 (HH) 3.5 - 5.1 mmol/L   Calcium, Ion 1.14 (L) 1.15 - 1.40 mmol/L   HCT 36.0 36.0 - 46.0 %   Hemoglobin 12.2 12.0 - 15.0 g/dL   Sample type VENOUS    Comment NOTIFIED PHYSICIAN   I-stat chem 8, ED (not at [facility] or [facility])     Status: Abnormal  Collection Time: 05/04/21  4:44 PM  Result Value Ref Range   Sodium 133 (L) 135 - 145 mmol/L   Potassium 8.0 (HH) 3.5 - 5.1 mmol/L   Chloride 111 98 - 111 mmol/L   BUN 60 (H) 8 - 23 mg/dL    Creatinine, Ser 4.40 (H) 0.44 - 1.00 mg/dL   Glucose, Bld 102 (H) 70 - 99 mg/dL    Comment: Glucose reference range applies only to samples taken after fasting for at least 8 hours.   Calcium, Ion 1.16 1.15 - 1.40 mmol/L   TCO2 10 (L) 22 - 32 mmol/L   Hemoglobin 12.6 12.0 - 15.0 g/dL   HCT 37.0 36.0 - 46.0 %   Comment NOTIFIED PHYSICIAN   Body fluid cell count with differential     Status: Abnormal   Collection Time: 05/04/21  5:20 PM  Result Value Ref Range   Color, Fluid BROWN    Appearance, Fluid TURBID (A) CLEAR   Total Nucleated Cell Count, Fluid 21,950 (H) 0 - 1,000 cu mm   Neutrophil Count, Fluid 90 (H) 0 - 25 %   Lymphs, Fluid 10 %   Monocyte-Macrophage-Serous Fluid 0 (L) 50 - 90 %   Eos, Fluid 0 %   Other Cells, Fluid INTRA AND OR EXTRACELLULAR ORGANISMS %    Comment: PRESENT, CORRELATE WITH MICRO. CRITICAL RESULT CALLED TO, READ BACK BY AND VERIFIED WITH: A FIELDS RN ON 05/05/21 @ 0348 BY CHAYES Performed at [facility], [place] [address], [place], [place] [address]   Body fluid culture w Gram Stain     Status: None (Preliminary result)   Collection Time: 05/04/21  6:00 PM   Specimen: Pleural Fluid  Result Value Ref Range   Specimen Description PLEURAL FLUID    Special Requests NONE    Gram Stain      ABUNDANT WBC PRESENT, PREDOMINANTLY PMN ABUNDANT GRAM POSITIVE COCCI IN PAIRS AND CHAINS RARE YEAST    Culture      CULTURE REINCUBATED FOR BETTER GROWTH Performed at [facility], [place] [address] [place], [place] [address]    Report Status PENDING   I-STAT 7, (LYTES, BLD GAS, ICA, H+H)     Status: Abnormal   Collection Time: 05/04/21  6:35 PM  Result Value Ref Range   pH, Arterial 7.176 (LL) 7.350 - 7.450   pCO2 arterial 34.2 32.0 - 48.0 mmHg   pO2, Arterial 116 (H) 83.0 - 108.0 mmHg   Bicarbonate 12.7 (L) 20.0 - 28.0 mmol/L   TCO2 14 (L) 22 - 32 mmol/L   O2 Saturation 97.0 %   Acid-base deficit 15.0 (H) 0.0 - 2.0 mmol/L   Sodium 139 135 - 145 mmol/L    Potassium 4.5 3.5 - 5.1 mmol/L   Calcium, Ion 1.28 1.15 - 1.40 mmol/L   HCT 37.0 36.0 - 46.0 %   Hemoglobin 12.6 12.0 - 15.0 g/dL   Collection site Radial    Drawn by HIDE    Sample type ARTERIAL    Comment NOTIFIED PHYSICIAN   MRSA Next Gen by PCR, Nasal     Status: None   Collection Time: 05/04/21  6:36 PM   Specimen: Nasal Mucosa; Nasal Swab  Result Value Ref Range   MRSA by PCR Next Gen NOT DETECTED NOT DETECTED    Comment: (NOTE) The GeneXpert MRSA Assay (FDA approved for NASAL specimens only), is one component of a comprehensive MRSA colonization surveillance program. It is not intended to diagnose MRSA infection  nor to guide or monitor treatment for MRSA infections. Test performance is not FDA approved in patients less than 50 years old. Performed at [facility], [place] [address], [place], [place] [address]   Glucose, capillary     Status: Abnormal   Collection Time: 05/04/21  6:43 PM  Result Value Ref Range   Glucose-Capillary 109 (H) 70 - 99 mg/dL    Comment: Glucose reference range applies only to samples taken after fasting for at least 8 hours.  Lactic acid, plasma     Status: Abnormal   Collection Time: 05/04/21  7:12 PM  Result Value Ref Range   Lactic Acid, Venous >11.0 (HH) 0.5 - 1.9 mmol/L    Comment: CRITICAL VALUE NOTED.  VALUE IS CONSISTENT WITH PREVIOUSLY REPORTED AND CALLED VALUE. Performed at [facility], [place] [address] [place], [place] [address]   Glucose, capillary     Status: Abnormal   Collection Time: 05/04/21  7:40 PM  Result Value Ref Range   Glucose-Capillary 106 (H) 70 - 99 mg/dL    Comment: Glucose reference range applies only to samples taken after fasting for at least 8 hours.  I-STAT 7, (LYTES, BLD GAS, ICA, H+H)     Status: Abnormal   Collection Time: 05/04/21  8:49 PM  Result Value Ref Range   pH, Arterial 7.304 (L) 7.350 - 7.450   pCO2 arterial 22.8 (L) 32.0 - 48.0 mmHg   pO2, Arterial 66 (L) 83.0 - 108.0 mmHg    Bicarbonate 11.6 (L) 20.0 - 28.0 mmol/L   TCO2 12 (L) 22 - 32 mmol/L   O2 Saturation 93.0 %   Acid-base deficit 14.0 (H) 0.0 - 2.0 mmol/L   Sodium 139 135 - 145 mmol/L   Potassium 4.4 3.5 - 5.1 mmol/L   Calcium, Ion 1.20 1.15 - 1.40 mmol/L   HCT 35.0 (L) 36.0 - 46.0 %   Hemoglobin 11.9 (L) 12.0 - 15.0 g/dL   Patient temperature 35.2 C    Collection site Radial    Drawn by Operator    Sample type ARTERIAL   Amylase, pleural or peritoneal fluid        Status: None   Collection Time: 05/04/21  9:50 PM  Result Value Ref Range   Amylase, Fluid 8,852 U/L    Comment: RESULT CONFIRMED BY MANUAL DILUTION DLB NO NORMAL RANGE ESTABLISHED FOR THIS TEST Performed at [facility], [address] [place], [place] [address]   Glucose, pleural or peritoneal fluid     Status: None   Collection Time: 05/04/21  9:50 PM  Result Value Ref Range   Glucose, Fluid 28 mg/dL    Comment: (NOTE) No normal range established for this test Results should be evaluated in conjunction with serum values Performed at [place] [address], [place], [place] [address]   Lactate dehydrogenase (pleural or peritoneal fluid)     Status: Abnormal   Collection Time: 05/04/21  9:50 PM  Result Value Ref Range   LD, Fluid 3,174 (H) 3 - 23 U/L    Comment: RESULTS CONFIRMED BY MANUAL DILUTION (NOTE) Results should be evaluated in conjunction with serum values Performed at [facility] [address], [place], [place] [address]   Protein, pleural or peritoneal fluid     Status: None   Collection Time: 05/04/21  9:50 PM  Result Value Ref Range   Total protein, fluid <3.0 g/dL    Comment: Performed at [facility], [address], [place], [place] [address]  Lactic acid, plasma     Status: Abnormal   Collection Time: 05/04/21  9:55 PM  Result Value Ref Range   Lactic Acid, Venous >11.0 (HH) 0.5 - 1.9 mmol/L    Comment: CRITICAL VALUE NOTED.  VALUE IS CONSISTENT WITH PREVIOUSLY  REPORTED AND CALLED VALUE. Performed at [facility], [place] [address] [place], [place] [address]   Glucose, capillary     Status: Abnormal   Collection Time: 05/04/21 11:14 PM  Result Value Ref Range   Glucose-Capillary 127 (H) 70 - 99 mg/dL    Comment: Glucose reference range applies only to samples taken after fasting for at least 8 hours.  CBC     Status: Abnormal   Collection Time: 05/05/21 12:01 AM  Result Value Ref Range   WBC 7.1 4.0 - 10.5 K/uL   RBC 3.98 3.87 - 5.11 MIL/uL   Hemoglobin 11.7 (L) 12.0 - 15.0 g/dL   HCT 36.3 36.0 - 46.0 %   MCV 91.2 80.0 - 100.0 fL    Comment: REPEATED TO VERIFY DELTA CHECK NOTED D HOVANDER RN ON 7.24.22 AT 0109 SAID IT IS OK TO ACCEPT. CHAYES    MCH 29.4 26.0 - 34.0 pg   MCHC 32.2 30.0 - 36.0 g/dL   RDW 15.0 11.5 - 15.5 %   Platelets 121 (L) 150 - 400 K/uL    Comment: REPEATED TO VERIFY   nRBC 1.0 (H) 0.0 - 0.2 %    Comment: Performed at [facility] [address] [place], [place] [address]  Magnesium     Status: Abnormal   Collection Time: 05/05/21 12:01 AM  Result Value Ref Range   Magnesium 3.1 (H) 1.7 - 2.4 mg/dL    Comment: Performed at [facility] [address], [place], [place] [address]  Renal function panel (daily at 0500)     Status: Abnormal   Collection Time: 05/05/21 12:01 AM  Result Value Ref Range   Sodium 139 135 - 145 mmol/L   Potassium 4.5 3.5 - 5.1 mmol/L   Chloride 98 98 - 111 mmol/L   CO2 17 (L) 22 - 32 mmol/L   Glucose, Bld 151 (H) 70 - 99 mg/dL    Comment: Glucose reference range applies only to samples taken after fasting for at least 8 hours.   BUN 38 (H) 8 - 23 mg/dL   Creatinine, Ser 3.11 (H) 0.44 - 1.00 mg/dL   Calcium 8.7 (L) 8.9 - 10.3 mg/dL   Phosphorus 5.5 (H) 2.5 - 4.6 mg/dL   Albumin 2.0 (L) 3.5 - 5.0 g/dL   GFR, Estimated 15 (L) >60 mL/min    Comment: (NOTE) Calculated using the CKD-EPI Creatinine Equation (2021)    Anion gap 24 (H) 5 - 15    Comment: REPEATED TO  VERIFY Performed at [facility] [address] [place], [place] [address]   APTT     Status: None   Collection Time: 05/05/21 12:01 AM  Result Value Ref Range   aPTT 24 24 - 36 seconds    Comment: Performed at [facility] [address], [place], [place] [address]  Protime-INR     Status: Abnormal   Collection Time: 05/05/21 12:01 AM  Result Value Ref Range   Prothrombin Time 21.5 (H) 11.4 - 15.2 seconds   INR 1.9 (H) 0.8 - 1.2    Comment: (NOTE) INR goal varies based on device  and disease states. Performed at [facility], [place] [address], [place], [place] [address]   Glucose, capillary     Status: None   Collection Time: 05/05/21  3:34 AM  Result Value Ref Range   Glucose-Capillary 99 70 - 99 mg/dL    Comment: Glucose reference range applies only to samples taken after fasting for at least 8 hours.  DIC Panel ONCE - STAT     Status: Abnormal   Collection Time: 05/05/21  8:03 AM  Result Value Ref Range   Prothrombin Time 21.6 (H) 11.4 - 15.2 seconds   INR 1.9 (H) 0.8 - 1.2    Comment: (NOTE) INR goal varies based on device and disease states.    aPTT 31 24 - 36 seconds   Fibrinogen 675 (H) 210 - 475 mg/dL    Comment: (NOTE) Fibrinogen results may be underestimated in patients receiving thrombolytic therapy.    D-Dimer, Quant 11.23 (H) 0.00 - 0.50 ug/mL-FEU    Comment: (NOTE) At the manufacturer cut-off value of 0.5 g/mL FEU, this assay has a negative predictive value of 95-100%.This assay is intended for use in conjunction with a clinical pretest probability (PTP) assessment model to exclude pulmonary embolism (PE) and deep venous thrombosis (DVT) in outpatients suspected of PE or DVT. Results should be correlated with clinical presentation.    Platelets 133 (L) 150 - 400 K/uL    Comment: REPEATED TO VERIFY   Smear Review NO SCHISTOCYTES SEEN     Comment: Performed at [facility], [place] [address], [place], [place] [address]  Glucose,  capillary     Status: None   Collection Time: 05/05/21  8:09 AM  Result Value Ref Range   Glucose-Capillary 71 70 - 99 mg/dL    Comment: Glucose reference range applies only to samples taken after fasting for at least 8 hours.    CT ABDOMEN PELVIS WO CONTRAST  Result Date: 05/04/2021 CLINICAL DATA:  Pneumothorax, abdominal distension per order. Per Dr. Robby Sermon request: the nurse pushed 5m contrast just before scanning through NG tube to rule out pooling in the chest. Cholecystectomy performed on 05/02/2021. EXAM: CT CHEST, ABDOMEN AND PELVIS WITHOUT CONTRAST TECHNIQUE: Multidetector CT imaging of the chest, abdomen and pelvis was performed following the standard protocol without IV contrast. COMPARISON:  Current chest radiograph. Abdomen and pelvis CT, 07/28/2008. Right upper quadrant ultrasound, 01/21/2021. FINDINGS: CT CHEST FINDINGS Cardiovascular: Heart is normal in size. Three-vessel coronary artery calcifications. No pericardial effusion. Great vessels are top-normal in size. Mild aortic atherosclerotic calcifications. Mediastinum/Nodes: Endotracheal tube tip lies 2 cm above the carinal. No neck base, mediastinal or hilar masses. No enlarged lymph nodes. Trachea is unremarkable. There is contrast within the esophagus. Nasogastric tube tip terminates in the distal esophagus. Lungs/Pleura: Right-sided chest tube has its tip in the posterior mid right hemithorax. Right-sided hydropneumothorax. Pneumothorax is approximately 40%. Small 2 moderate amount of pleural fluid with intermixed bubbles of air. Fluid appears partly loculated. Trace amount of left pleural fluid. No left pneumothorax. There is opacity in the posterior aspect of the right upper lobe and middle lobe with collapse/consolidation of the right lower lobe. Minimal dependent atelectasis is noted on the left. Left lung otherwise clear. Musculoskeletal: No fracture or acute finding.  No bone lesion. CT ABDOMEN PELVIS FINDINGS  Hepatobiliary: No focal liver abnormality is seen. Status post cholecystectomy. No biliary dilatation. Pancreas: Unremarkable. No pancreatic ductal dilatation or surrounding inflammatory changes. Spleen: Relatively small spleen.  No mass. Adrenals/Urinary Tract: No  adrenal masses. Bilateral renal masses. 6 cm hypoattenuating mass arises from the lower pole of the right kidney. Somewhat more complex appearing mass, also hypoattenuating, arises from the mid to upper pole the left kidney, 5.9 cm in size. Smaller round mass arises from the anterior lower pole the left kidney, 2 cm in size. These are all consistent with cysts. No other renal masses, no stones and no hydronephrosis. Ureters are normal in course and in caliber. Bladder is mostly decompressed with a Foley catheter. Stomach/Bowel: Stomach is unremarkable. Small bowel and colon are normal in caliber. No wall thickening. No inflammation. Normal appendix visualized. Vascular/Lymphatic: Aortic atherosclerosis. No enlarged abdominal or pelvic lymph nodes. Reproductive: Unremarkable. Other: Small bubbles of air are noted in the anterior abdominal wall subcutaneous fat consistent with postoperative air. There is hazy inflammatory type change in the upper abdomen centered on the porta hepatis, also consistent with expected postoperative change. No ascites. No abdominal wall hernia. Musculoskeletal: No fracture or acute finding.  No bone lesion. IMPRESSION: 1. Right hydropneumothorax. Pneumothorax is approximately 40%. There is partly loculated pleural fluid and contains bubbles of air. A right-sided chest tube has its tip lying in the posterior right mid hemithorax. 2. Consolidation/atelectasis in the dependent right lung, involving the entire right lower lobe. A component of infection should be considered in the proper clinical setting. 3. Nasogastric tube tip lies in the distal esophagus. 4. Hazy opacity noted in the upper abdomen centered on the porta hepatis  consistent with the expected postoperative change following the recent cholecystectomy. There is no abscess or evidence of an operative complication. 5. Coronary artery and aortic atherosclerosis. 6. Well-positioned endotracheal tube. Electronically Signed   By: Lajean Manes M.D.   On: 05/04/2021 18:35   CT HEAD WO CONTRAST  Result Date: 05/04/2021 CLINICAL DATA:  80 year old female with altered mental status. EXAM: CT HEAD WITHOUT CONTRAST TECHNIQUE: Contiguous axial images were obtained from the base of the skull through the vertex without intravenous contrast. COMPARISON:  None. FINDINGS: Brain: Mild age-related atrophy and moderate chronic microvascular ischemic changes. There is no acute intracranial hemorrhage. No mass effect or midline shift. No extra-axial fluid collection. Vascular: No hyperdense vessel or unexpected calcification. Skull: Normal. Negative for fracture or focal lesion. Sinuses/Orbits: No acute finding. Other: An endotracheal and enteric tube are partially visualized. IMPRESSION: 1. No acute intracranial pathology. 2. Age-related atrophy and chronic microvascular ischemic changes. Electronically Signed   By: Anner Crete M.D.   On: 05/04/2021 18:19   CT Chest Wo Contrast  Result Date: 05/04/2021 CLINICAL DATA:  Pneumothorax, abdominal distension per order. Per Dr. Robby Sermon request: the nurse pushed 98m contrast just before scanning through NG tube to rule out pooling in the chest. Cholecystectomy performed on 05/02/2021. EXAM: CT CHEST, ABDOMEN AND PELVIS WITHOUT CONTRAST TECHNIQUE: Multidetector CT imaging of the chest, abdomen and pelvis was performed following the standard protocol without IV contrast. COMPARISON:  Current chest radiograph. Abdomen and pelvis CT, 07/28/2008. Right upper quadrant ultrasound, 01/21/2021. FINDINGS: CT CHEST FINDINGS Cardiovascular: Heart is normal in size. Three-vessel coronary artery calcifications. No pericardial effusion. Great vessels  are top-normal in size. Mild aortic atherosclerotic calcifications. Mediastinum/Nodes: Endotracheal tube tip lies 2 cm above the carinal. No neck base, mediastinal or hilar masses. No enlarged lymph nodes. Trachea is unremarkable. There is contrast within the esophagus. Nasogastric tube tip terminates in the distal esophagus. Lungs/Pleura: Right-sided chest tube has its tip in the posterior mid right hemithorax. Right-sided hydropneumothorax. Pneumothorax is approximately 40%. Small 2 moderate  amount of pleural fluid with intermixed bubbles of air. Fluid appears partly loculated. Trace amount of left pleural fluid. No left pneumothorax. There is opacity in the posterior aspect of the right upper lobe and middle lobe with collapse/consolidation of the right lower lobe. Minimal dependent atelectasis is noted on the left. Left lung otherwise clear. Musculoskeletal: No fracture or acute finding.  No bone lesion. CT ABDOMEN PELVIS FINDINGS Hepatobiliary: No focal liver abnormality is seen. Status post cholecystectomy. No biliary dilatation. Pancreas: Unremarkable. No pancreatic ductal dilatation or surrounding inflammatory changes. Spleen: Relatively small spleen.  No mass. Adrenals/Urinary Tract: No adrenal masses. Bilateral renal masses. 6 cm hypoattenuating mass arises from the lower pole of the right kidney. Somewhat more complex appearing mass, also hypoattenuating, arises from the mid to upper pole the left kidney, 5.9 cm in size. Smaller round mass arises from the anterior lower pole the left kidney, 2 cm in size. These are all consistent with cysts. No other renal masses, no stones and no hydronephrosis. Ureters are normal in course and in caliber. Bladder is mostly decompressed with a Foley catheter. Stomach/Bowel: Stomach is unremarkable. Small bowel and colon are normal in caliber. No wall thickening. No inflammation. Normal appendix visualized. Vascular/Lymphatic: Aortic atherosclerosis. No enlarged abdominal  or pelvic lymph nodes. Reproductive: Unremarkable. Other: Small bubbles of air are noted in the anterior abdominal wall subcutaneous fat consistent with postoperative air. There is hazy inflammatory type change in the upper abdomen centered on the porta hepatis, also consistent with expected postoperative change. No ascites. No abdominal wall hernia. Musculoskeletal: No fracture or acute finding.  No bone lesion. IMPRESSION: 1. Right hydropneumothorax. Pneumothorax is approximately 40%. There is partly loculated pleural fluid and contains bubbles of air. A right-sided chest tube has its tip lying in the posterior right mid hemithorax. 2. Consolidation/atelectasis in the dependent right lung, involving the entire right lower lobe. A component of infection should be considered in the proper clinical setting. 3. Nasogastric tube tip lies in the distal esophagus. 4. Hazy opacity noted in the upper abdomen centered on the porta hepatis consistent with the expected postoperative change following the recent cholecystectomy. There is no abscess or evidence of an operative complication. 5. Coronary artery and aortic atherosclerosis. 6. Well-positioned endotracheal tube. Electronically Signed   By: Lajean Manes M.D.   On: 05/04/2021 18:35   DG Chest Port 1 View  Result Date: 05/05/2021 CLINICAL DATA:  Central line placement, altered mental status EXAM: PORTABLE CHEST 1 VIEW COMPARISON:  Radiograph 05/04/2021 FINDINGS: Right pneumothorax is similar to slightly increased from comparison prior radiography despite stable positioning of a right pleural catheter directed apically. Accounting for differences in positioning including a steep left anterior obliquity. Dual lumen right IJ approach central venous catheter tip terminates at the level of the right atrium. Endotracheal tube tip terminates 3.6 cm from the carina. Transesophageal tube tip and side port terminate beyond the GE junction, below the margins of imaging.  Telemetry leads and external support devices overlie the chest. Stable cardiomegaly, cardiomediastinal contours and evidence of prior coronary stenting. Some persistent hazy opacity present in left lung base, favor atelectatic. Additional atelectatic changes and volume loss in the partially collapsed right. IMPRESSION: Right pneumothorax is similar to slightly increased in size accounting for differences in patient positioning. This is despite the continued presence of a right pleural catheter. Additional lines and tubes as above. Bibasilar hazy opacities, favoring atelectatic change with additional volume loss in the partially collapsed right lung. Stable cardiomegaly, coronary and  aortic atherosclerosis. Prior coronary stenting. Electronically Signed   By: Lovena Le M.D.   On: 05/05/2021 02:23   DG Chest Port 1 View  Result Date: 05/04/2021 CLINICAL DATA:  Pneumothorax EXAM: PORTABLE CHEST 1 VIEW COMPARISON:  05/04/2021 at 4:17 p.m. FINDINGS: Unchanged appearance of endotracheal tube and esophageal catheter. The right pneumothorax is decreased in size following placement a right chest wall catheter. Left lung is clear. IMPRESSION: Decrease in size of right pneumothorax following chest wall catheter placement. Electronically Signed   By: Ulyses Jarred M.D.   On: 05/04/2021 22:33   DG Chest Port 1 View  Result Date: 05/04/2021 CLINICAL DATA:  sob EXAM: PORTABLE CHEST 1 VIEW COMPARISON:  March 29, 2021 FINDINGS: Evaluation is limited secondary to mode tubal overlying lines. The cardiomediastinal silhouette is enlarged in contour.Atherosclerotic calcifications of the aorta. Large RIGHT pleural effusion. ETT tip terminates over the RIGHT mainstem bronchus. The enteric tube courses through the chest to the abdomen beyond the field-of-view. No pneumothorax. Bibasilar homogeneous opacities, likely atelectasis. Visualized abdomen is unremarkable. Multilevel degenerative changes of the thoracic spine. IMPRESSION:  1.  ETT tip terminates over the RIGHT mainstem bronchus. 2.  Large loculated appearing RIGHT-sided pleural effusion. These results were called by telephone at the time of interpretation on 05/04/2021 at 4:29 pm to provider [facility] , who verbally acknowledged these results. Electronically Signed   By: Valentino Saxon MD   On: 05/04/2021 16:31   DG ESOPHAGUS W SINGLE CM (SOL OR THIN BA)  Result Date: 05/05/2021 CLINICAL DATA:  Pneumothorax and clinical concern for esophageal leak. EXAM: ESOPHOGRAM/BARIUM SWALLOW TECHNIQUE: Single contrast examination was performed using water-soluble contrast material. FLUOROSCOPY TIME:  Fluoroscopy Time:  3 minutes and 6 seconds. Radiation Exposure Index (if provided by the fluoroscopic device): 106 mGy Number of Acquired Spot Images: COMPARISON:  Chest abdomen pelvis CT 05/04/2021 FINDINGS: Initial fluoroscopy of the mediastinum shows no residual contrast material from CT scan yesterday. Patient had an existing OG tube in situ. OG tube was pulled back so that the distal tip was in the region of the esophagogastric junction, and the proximal side port in the distal third of the esophagus. Water-soluble contrast was then introduced via the OG tube into the esophageal lumen. With initial injection, a thin right paraesophageal line of extraluminal contrast material appears, approximately 4 cm below the level of the carina. Approximately 50 cc of contrast was introduced via the OG tube. With continued observation, no continued flow of extraluminal contrast was observed and no frank migration of contrast into the right pleural space could be identified. Subsequently, the patient was turned LPO which revealed a more prominent finger-like projection of extraluminal contrast posterior to the esophagus and tracking to the right. Continued observation showed no free flow of this contrast material into the right pleural space. The OG tube was then flushed with water and  repositioned so that the tip was in the mid stomach, placing the proximal port well below the EG junction. The patient's nurse then taped the OG tube back in place. IMPRESSION: Contained finger-like projection of extraluminal contrast arising from the posterior/right posterolateral esophagus approximately 1-2 cm distal to the carina. This extraluminal collection tracks caudally for a total distance of 4-5 cm, but no frank migration of contrast into the right pleural space is identified on this study. Critical Value/emergent results were called by telephone at the time of interpretation on 05/05/2021 at approximately 12:15 pm to provider [facility] , who verbally acknowledged these results. Electronically  Signed   By: Misty Stanley M.D.   On: 05/05/2021 12:20    Review of Systems  Unable to perform ROS: Intubated  Blood pressure 107/71, pulse 74, temperature 98.6 F (37 C), resp. rate (!) 26, height 5' 1"  (1.549 m), weight 58 kg, SpO2 95 %. Physical Exam Constitutional:      Comments: Intubated and sedated on fentanyl drip but opens her eyes and follows commands  HENT:     Head: Normocephalic and atraumatic.  Eyes:     Extraocular Movements: Extraocular movements intact.     Pupils: Pupils are equal, round, and reactive to light.  Cardiovascular:     Rate and Rhythm: Regular rhythm. Bradycardia present.     Pulses: Normal pulses.     Heart sounds: Normal heart sounds. No murmur heard. Pulmonary:     Comments: Decreased breath sounds on the right Abdominal:     General: Abdomen is flat. There is no distension.     Palpations: Abdomen is soft.     Tenderness: There is no abdominal tenderness.  Musculoskeletal:        General: Swelling present.   Narrative & Impression  CLINICAL DATA:  Pneumothorax and clinical concern for esophageal leak.   EXAM: ESOPHOGRAM/BARIUM SWALLOW   TECHNIQUE: Single contrast examination was performed using water-soluble contrast material.   FLUOROSCOPY  TIME:  Fluoroscopy Time:  3 minutes and 6 seconds.   Radiation Exposure Index (if provided by the fluoroscopic device): 106 mGy   Number of Acquired Spot Images:   COMPARISON:  Chest abdomen pelvis CT 05/04/2021   FINDINGS: Initial fluoroscopy of the mediastinum shows no residual contrast material from CT scan yesterday. Patient had an existing OG tube in situ.   OG tube was pulled back so that the distal tip was in the region of the esophagogastric junction, and the proximal side port in the distal third of the esophagus. Water-soluble contrast was then introduced via the OG tube into the esophageal lumen. With initial injection, a thin right paraesophageal line of extraluminal contrast material appears, approximately 4 cm below the level of the carina. Approximately 50 cc of contrast was introduced via the OG tube. With continued observation, no continued flow of extraluminal contrast was observed and no frank migration of contrast into the right pleural space could be identified.   Subsequently, the patient was turned LPO which revealed a more prominent finger-like projection of extraluminal contrast posterior to the esophagus and tracking to the right. Continued observation showed no free flow of this contrast material into the right pleural space.   The OG tube was then flushed with water and repositioned so that the tip was in the mid stomach, placing the proximal port well below the EG junction. The patient's nurse then taped the OG tube back in place.   IMPRESSION: Contained finger-like projection of extraluminal contrast arising from the posterior/right posterolateral esophagus approximately 1-2 cm distal to the carina. This extraluminal collection tracks caudally for a total distance of 4-5 cm, but no frank migration of contrast into the right pleural space is identified on this study.   Critical Value/emergent results were called by telephone at the time of  interpretation on 05/05/2021 at approximately 12:15 pm to provider [facility] , who verbally acknowledged these results.     Electronically Signed   By: Misty Stanley M.D.   On: 05/05/2021 12:20      Assessment/Plan:  This 80 year old woman has a mid esophageal perforation with leak into the mediastinum  and right chest status post laparoscopic cholecystectomy on 05/02/2021.  She presented with acute hypoxemic respiratory failure and renal failure with severe lactic acidosis related to sepsis.  The etiology of this perforation is unclear but I do not think it is related to the laparoscopic cholecystectomy procedure itself.  Her family did not notice any vomiting postoperatively at home.  I think the only option for salvaging this patient is proceeding with emergent right thoracotomy for an attempt at repair of the esophagus and drainage of the right chest and mediastinum.  Her mortality rate is still very high given her age, time since perforation, and preoperative multisystem organ failure.  I talked with her Sister [name] and her daughter Trinna Balloon by telephone and explained the situation to them.  Her daughter said that she talked to her siblings and they have decided that they would agree to proceed with surgery.  They understand the high risk of morbidity and mortality with her esophageal perforation and the possibility that she may have a postoperative leak that would prevent her from oral nutrition for some time as she may require a feeding tube for nutrition. I discussed the operative procedure with them including alternatives, benefits and risks; including but not limited to bleeding, blood transfusion, infection, myocardial infarction,  organ dysfunction, esophageal leak and death.  Her family understands and agrees to proceed.  We will schedule surgery as soon as the operating room is available.   Fernande Boyden Tenicia Gural 05/05/2021, 1:16 PM

## 2021-05-05 NOTE — Progress Notes (Signed)
NAME:  Dana Coleman, MRN:  [id number], DOB:  02/17/1941, LOS: 1 ADMISSION DATE:  05/04/2021, CONSULTATION DATE:  05/04/21 REFERRING MD:  Ralene Bathe, CHIEF COMPLAINT:  SOB  History of Present Illness:  Post op GB surgery Worsened abd post post Brought in by EMS, began becoming brady, hypoxemic to 70s, intubated CXR with large loculated R effusion CTA ordered Labs pending PCCM consulted for admission Patient currently intubated and sedated  Pertinent  Medical History  Prior R breast cancer DM CAD HLD HTN  Significant Hospital Events: Including procedures, antibiotic start and stop dates in addition to other pertinent events   7/23 admitted intubated. Pigtail by CCS in ED -- interestingly seems to have bilious outpt  7/24 Starting CRRT. Abundant GPCs in pleural fluid Interim History / Subjective:  HD cath inserted this morning for CRRT  Persistent ptx, pigtail in good position   Objective   Blood pressure (!) 119/54, pulse (!) 53, temperature 100.04 F (37.8 C), resp. rate (!) 7, height '5\' 1"'$  (1.549 m), weight 58 kg, SpO2 99 %.    Vent Mode: PRVC FiO2 (%):  [80 %-100 %] 80 % Set Rate:  [16 bmp-26 bmp] 26 bmp Vt Set:  [450 mL] 450 mL PEEP:  [5 cmH20] 5 cmH20 Plateau Pressure:  [18 S192499 cmH20] 21 cmH20   Intake/Output Summary (Last 24 hours) at 05/05/2021 0732 Last data filed at 05/05/2021 0700 Gross per 24 hour  Intake 1160.15 ml  Output 1869 ml  Net -708.85 ml   Filed Weights   05/04/21 1625  Weight: 58 kg    Examination: General: critically ill appearing older adult F intubated sedated NAD  HENT: NCAT anicteric sclera ETT secure  Lungs: Symmetrical chest expansion, mechanically ventilated. R chest tube. No air leak. Mostly serous appearing fluid with some thicker cloudy material lining dependent parts of tubing.  Cardiovascular: irreg rhythm reg rate.  Abdomen: OGT with thin watery-bilious appearing output. Lap sites clean dry well approximated no   Extremities: no acute joint deformity no cyanosis or clubbing  Neuro: lightly sedated, awakens spontaneously, tracking, not following commands  GU: foley, yellow urine   Resolved Hospital Problem list     Assessment & Plan:   Acute hypoxemic respiratory failure Hydropneumothorax, s/p pigtail placement 7/23 by CCS. Outpt reportedly pretty bilious at first, now more serous  Elevated LD and amylase on pleural fluid  Abundant GPCs  -CCS following chest tube -wean vent support as able -VAP, pulm hygiene   Septic shock Seems likely to be pleural source  CT imaging + high amylase raises question of possible esoph perf -will review with ccm md, ?involve cvts  -doxy zosyn  -NE for MAP > 65 -follow pleural cx   POD 3 Lap chole (op date 7/21)  -CCS following   Acute renal failure Severe AMGA Severe lactic acidosis  P -CRRT per neprho   Thrombocytopenia  Coagulopathy  -STAT DIC panel  Hx HTN Hx HLD  Hx DM  -hold anyihypertensives -SSI   Best Practice (right click and "Reselect all SmartList Selections" daily)   Diet/type: NPO DVT prophylaxis: SCD GI prophylaxis: N/A Lines: N/A Foley:  Yes, and it is still needed Code Status:  full code Last date of multidisciplinary goals of care discussion [pending]   Labs   CBC: Recent Labs  Lab 05/04/21 1632 05/04/21 1643 05/04/21 1644 05/04/21 1835 05/04/21 2049 05/05/21 0001  WBC 12.3*  --   --   --   --  7.1  NEUTROABS 9.5*  --   --   --   --   --  HGB 10.8* 12.2 12.6 12.6 11.9* 11.7*  HCT 37.3 36.0 37.0 37.0 35.0* 36.3  MCV 104.2*  --   --   --   --  91.2  PLT 168  --   --   --   --  121*    Basic Metabolic Panel: Recent Labs  Lab 05/04/21 1632 05/04/21 1643 05/04/21 1644 05/04/21 1835 05/04/21 2049 05/05/21 0001  NA 140 132* 133* 139 139 139  K 4.4 8.0* 8.0* 4.5 4.4 4.5  CL 106  --  111  --   --  98  CO2 8*  --   --   --   --  17*  GLUCOSE 118*  --  102*  --   --  151*  BUN 38*  --  60*  --   --   38*  CREATININE 4.07*  --  4.40*  --   --  3.11*  CALCIUM 10.0  --   --   --   --  8.7*  MG  --   --   --   --   --  3.1*  PHOS  --   --   --   --   --  5.5*   GFR: Estimated Creatinine Clearance: 12 mL/min (A) (by C-G formula based on SCr of 3.11 mg/dL (H)). Recent Labs  Lab 05/04/21 1632 05/04/21 1637 05/04/21 1912 05/04/21 2155 05/05/21 0001  WBC 12.3*  --   --   --  7.1  LATICACIDVEN  --  >11.0* >11.0* >11.0*  --     Liver Function Tests: Recent Labs  Lab 05/04/21 1632 05/05/21 0001  AST 138*  --   ALT 97*  --   ALKPHOS 49  --   BILITOT 0.8  --   PROT 5.2*  --   ALBUMIN 2.1* 2.0*   No results for input(s): LIPASE, AMYLASE in the last 168 hours. No results for input(s): AMMONIA in the last 168 hours.  ABG    Component Value Date/Time   PHART 7.304 (L) 05/04/2021 2049   PCO2ART 22.8 (L) 05/04/2021 2049   PO2ART 66 (L) 05/04/2021 2049   HCO3 11.6 (L) 05/04/2021 2049   TCO2 12 (L) 05/04/2021 2049   ACIDBASEDEF 14.0 (H) 05/04/2021 2049   O2SAT 93.0 05/04/2021 2049     Coagulation Profile: Recent Labs  Lab 05/05/21 0001  INR 1.9*    Cardiac Enzymes: No results for input(s): CKTOTAL, CKMB, CKMBINDEX, TROPONINI in the last 168 hours.  HbA1C: Hemoglobin A1C  Date/Time Value Ref Range Status  12/18/2020 10:20 AM 6.8 (A) 4.0 - 5.6 % Final  08/14/2020 01:35 PM 7.2 (A) 4.0 - 5.6 % Final   HbA1c, POC (controlled diabetic range)  Date/Time Value Ref Range Status  05/15/2020 03:27 PM 8.2 (A) 0.0 - 7.0 % Final  10/28/2019 02:30 PM 8.2 (A) 0.0 - 7.0 % Final   Hgb A1c MFr Bld  Date/Time Value Ref Range Status  04/26/2021 11:21 AM 7.4 (H) 4.8 - 5.6 % Final    Comment:    (NOTE) Pre diabetes:          5.7%-6.4%  Diabetes:              >6.4%  Glycemic control for   <7.0% adults with diabetes     CBG: Recent Labs  Lab 05/02/21 0945 05/04/21 1843 05/04/21 1940 05/04/21 2314 05/05/21 0334  GLUCAP 78 109* 106* 127* 99   CRITICAL CARE Performed by:  Cristal Generous  Total critical care time: 42 minutes  Critical care time was exclusive of separately billable procedures and treating other patients. Critical care was necessary to treat or prevent imminent or life-threatening deterioration.  Critical care was time spent personally by me on the following activities: development of treatment plan with patient and/or surrogate as well as nursing, discussions with consultants, evaluation of patient's response to treatment, examination of patient, obtaining history from patient or surrogate, ordering and performing treatments and interventions, ordering and review of laboratory studies, ordering and review of radiographic studies, pulse oximetry and re-evaluation of patient's condition.  Eliseo Gum MSN, AGACNP-BC [place] for pager  05/05/2021, 7:32 AM

## 2021-05-05 NOTE — Op Note (Signed)
CARDIOTHORACIC SURGERY OPERATIVE NOTE 05/05/2021 NALINA WORK [id number]  Surgeon:  Gaye Pollack, MD  First Assistant: RNFA    Preoperative Diagnosis:  Esophageal perforation with right empyema  Postoperative Diagnosis:  Same   Procedure:  1.  Right thoracotomy 2.  Repair of esophageal perforation with intercostal pedicled muscle flap. 3.  Drainage of empyema and decortication of right lung  Anesthesia:  General Endotracheal   Clinical History/Surgical Indication:  The patient is a 80 year old woman with a history of hypertension, hyperlipidemia, diabetes, MI in 2004 with a low risk nuclear scan in March 2019 and ejection fraction of 55 to 65% at that time who underwent laparoscopic cholecystectomy by Dr. Greer Pickerel on 05/02/2021.  That was an uncomplicated procedure and patient was discharged home.  She returned the emergency room yesterday with complaints of altered mental status noted by her family as well as abdominal discomfort and then she became unresponsive and slumped over the table.  She was found by EMS to be hypotensive and bradycardic with hypoxemia and sats in the 70s.  She was intubated.  Chest x-ray on presentation showed a large loculated right pleural effusion.  She was seen by general surgery and had a pigtail catheter placed in the right pleural space by general surgery and the drainage from the pigtail catheter.  Similar to the NG drainage.  She was admitted to [facility].  Her creatinine on presentation was 4.07 compared to her normal at 0.88.  Electrolytes were normal with a venous lactic acid of  >11.  White blood cell count was 12.3.  She was started on CRRT for acute renal failure although she was still making some urine.  Her oxygenation improved overnight and Levophed was weaned.  CT scan of the chest and abdomen/pelvis this morning showed a loculated right hydropneumothorax with about 40% pneumothorax component.  There were air bubbles within the loculated  pleural fluid.  The right pigtail catheter was lying within the fluid.  There is consolidation and atelectasis in the right lung involving the entire right lower lobe.  Nasogastric tube was in the distal esophagus.  She was taken back down to radiology later this morning for an esophagram through the NG tube.  This showed extraluminal contrast leaking from the midportion of the esophagus on the posterior right aspect of the esophagus about 1 to 2 cm distal to the carina.  The extraluminal contrast tract caudally for about 4-5 cm.  She has a mid esophageal perforation with leak into the mediastinum and right chest status post laparoscopic cholecystectomy on 05/02/2021.  She presented with acute hypoxemic respiratory failure and renal failure with severe lactic acidosis related to sepsis.  The etiology of this perforation is unclear but I do not think it is related to the laparoscopic cholecystectomy procedure itself.  Her family did not notice any vomiting postoperatively at home.  I think the only option for salvaging this patient is proceeding with emergent right thoracotomy for an attempt at repair of the esophagus and drainage of the right chest and mediastinum.  Her mortality rate is still very high given her age, time since perforation, and preoperative multisystem organ failure.  I talked with her sister [name] and her daughter Trinna Balloon by telephone and explained the situation to them.  Her daughter said that she talked to her siblings and they have decided that they would agree to proceed with surgery.  They understand the high risk of morbidity and mortality with her esophageal perforation and the possibility  that she may have a postoperative leak that would prevent her from oral nutrition for some time as she may require a feeding tube for nutrition. I discussed the operative procedure with them including alternatives, benefits and risks; including but not limited to bleeding, blood transfusion,  infection, myocardial infarction,  organ dysfunction, esophageal leak and death.  Her family understands and agrees to proceed.  We will schedule surgery as soon as the operating room is available.   Preparation:  The patient was taken directly to the operating room intubated.  The consent was signed by me. Preoperative antibiotics were given. A radial arterial line was placed by the anesthesia team.  She was positioned supine on the operating room table. After being placed under general endotracheal anesthesia by the anesthesia team the single lumen tube was converted to a double lumen tube and the patient was turned into the left lateral decubitus position with the right side up. The chest was prepped with betadine soap and solution and draped in the usual sterile manner. A surgical time-out was taken and the correct patient and operative procedure and operative side were confirmed with the nursing and anesthesia staff.   Left thoracotomy and repair of esophageal perforation:  The chest was entered through a posterior lateral thoracotomy incision. The pleural space was entered through the 5th ICS harvesting the fifth intercostal muscle pedicle and removing the lateral portion of the fifth rib.  There is a large amount of fibrinopurulent murky fluid within the pleural space that was loculated.  The lung was firmly adherent to the chest wall in several locations particularly the lower lobe.  This took considerable time to mobilize the lung from the chest wall to be able to expose the esophagus.  The lung was mobilized without any injury.  The esophagus was identified and in the midportion just below the carina the mediastinal pleura was thickened with fibropurulent exudate covering it.  The mediastinal pleura was opened over the esophagus to expose the area of perforation which was in the right lateral wall of the esophagus just below the carina and measured 5 mm in diameter.  There is no palpable  abnormality in esophagus to suggest any mass or stricture.  The defect was closed with three 3-0 Vicryl sutures placed through the muscle to imbricate the mucosa at the defect.  This repair was then covered with the intercostal muscle pedicle which was sutured to the mediastinal pleura using interrupted 3-0 Vicryl sutures.  The orogastric tube which was already in place was left as it is.  Drainage of empyema and decortication of the lung:  All the loculations were divided and all the fluid collection is draining.  There was a fibropurulent exudate covering the entire lung which was removed as completely as possible.  There was some area in the lower lobe where the exudate was so adherent to the lung that could not be removed.  Completion:  A 28 French chest tube was placed in the pleural space posteriorly and advanced up to the apex.  A 32 [place] Blake drain was positioned along the posterior mediastinum in the area of the esophageal repair.  Another 57 [place] Blake drain was positioned in the posterior costophrenic sulcus.  The ribs were reapproximated with # 2 vicryl pericostal sutures . The muscles were closed in layers with continuous #1 vicryl suture. The subcutaneous tissue was closed with 2-0 vicryl suture and the skin with staples.  The sponge, needle, and instrument counts were correct according to  the nurses. Dry sterile dressings were applied and the chest tubes were connected to pleurevac suction. The patient was turned into the supine position, the double-lumen tube changed to a single-lumen tube, and the patient transferred to the intensive care unit in critical but stable condition.

## 2021-05-05 NOTE — Progress Notes (Addendum)
eLink Physician-Brief Progress Note Patient Name: Dana Coleman DOB: 1940/10/23 MRN: [id number]   Date of Service  05/05/2021  HPI/Events of Note  19 F hypertension, dyslipidemia, DM, CAD s/p lap chole 7/21. She returned 7/23 with altered sensorium, hypotensive, bradycardic and hypoxic. Found to have right sided loculated pleural effusion, indwelling cathter placed with output similar to NG output. Found to have perforated esophagus undwerwent right thoracotomy and major repair.  eICU Interventions  Remains intubated, sedated on Fentanyl as was noted to be waking up post op. Not requiring pressors at this time.     Intervention Category Evaluation Type: New Patient Evaluation  Judd Lien 05/05/2021, 9:09 PM

## 2021-05-05 NOTE — Progress Notes (Signed)
[facility] Progress Note  Subjective: seen in ICU, family at bedside  Vitals:   05/05/21 1300 05/05/21 1341 05/05/21 1400 05/05/21 1430  BP:  95/64 (!) 89/65 102/69  Pulse:  91 88 91  Resp: (!) 26 (!) 26 (!) 26 (!) 26  Temp: 100.22 F (37.9 C) (!) 100.58 F (38.1 C) (!) 100.76 F (38.2 C) (!) 101.12 F (38.4 C)  TempSrc:      SpO2:  95% 93% 94%  Weight:      Height:        Exam: Gen on vent, sedated No rash, cyanosis or gangrene Sclera anicteric, throat w/ ETT No jvd or bruits Chest clear anterior/ lateral, R chest pigtail chest tube RRR no MRG Abd soft ntnd no mass or ascites +bs GU foley cath draining clear amber urine MS no joint effusions or deformity Ext no LE or UE edema Neuro is on vent, sedated         Assessment/ Plan: AKI - severe due to septic shock and hypoperfusion. Normal renal function on 04/26/21 w/ creat 0.88.  Creat 4.40 here on admit, suspected ATN due to septic shock/ hypoperfusion. Some UOP, < 500 cc/ day.  Started on CRRT 7/24 due to severe acidemia.  Cont CRRT.   Volume- no vol excess on exam, keeping even for now SP cholecystectomy - on 7/21 R loculated effusion - sp chest tube, possible empyema, on IV abx Septic shock - on pressor support Metabolic acidosis - improving IDDM - per CCM     Rob Adolphus Hanf 05/05/2021, 3:22 PM   Recent Labs  Lab 05/04/21 2049 05/05/21 0001 05/05/21 1247  K 4.4 4.5 4.0  BUN  --  38* 31*  CREATININE  --  3.11* 2.20*  CALCIUM  --  8.7* 8.2*  PHOS  --  5.5* 3.0  HGB 11.9* 11.7*  --    Inpatient medications:  [MAR Hold] albuterol  10 mg Nebulization Once   [MAR Hold] chlorhexidine gluconate (MEDLINE KIT)  15 mL Mouth Rinse BID   [MAR Hold] Chlorhexidine Gluconate Cloth  6 each Topical Daily   [MAR Hold] fentaNYL (SUBLIMAZE) injection  25 mcg Intravenous Once   [MAR Hold] heparin injection (subcutaneous)  5,000 Units Subcutaneous Q8H   [MAR Hold] insulin aspart  0-15 Units Subcutaneous  Q4H   [MAR Hold] mouth rinse  15 mL Mouth Rinse 10 times per day   [MAR Hold] pantoprazole (PROTONIX) IV  40 mg Intravenous QHS    [MAR Hold] sodium chloride 10 mL/hr at 05/05/21 1000   anidulafungin 200 mg (05/05/21 1400)   Followed by   [ONG Hold] anidulafungin     fentaNYL infusion INTRAVENOUS 50 mcg/hr (05/05/21 1000)   norepinephrine (LEVOPHED) Adult infusion 5 mcg/min (05/05/21 1043)   [MAR Hold] piperacillin-tazobactam 3.375 g (05/05/21 1014)   prismasol BGK 4/2.5 1,800 mL/hr at 05/05/21 0342   sodium bicarbonate (isotonic) 1000 mL infusion 150 mL/hr at 05/05/21 0938   sodium bicarbonate (isotonic) 1000 mL infusion 100 mL/hr at 05/05/21 0342   [MAR Hold] sodium chloride, [MAR Hold] alteplase, [MAR Hold] docusate, [MAR Hold] fentaNYL, [MAR Hold] heparin, [MAR Hold] polyethylene glycol, [MAR Hold] sodium chloride

## 2021-05-05 NOTE — Progress Notes (Addendum)
Subjective/Chief Complaint: Patient underwent chest catheter placement, intubation, initiation of CRRT overnight. Remains critically ill. Brother and sister at bedside.    Objective: Vital signs in last 24 hours: Temp:  [95.18 F (35.1 C)-100.58 F (38.1 C)] 98.78 F (37.1 C) (07/24 0800) Pulse Rate:  [27-114] 64 (07/24 0800) Resp:  [0-29] 26 (07/24 0800) BP: (84-142)/(50-79) 117/72 (07/24 0800) SpO2:  [59 %-100 %] 96 % (07/24 0800) FiO2 (%):  [80 %-100 %] 80 % (07/24 0500) Weight:  [58 kg] 58 kg (07/23 1625) Last BM Date:  (PTA)  Intake/Output from previous day: 07/23 0701 - 07/24 0700 In: 1160.2 [I.V.:810.1; IV Piggyback:350] Out: 1869 [Urine:610; Emesis/NG output:150; Chest Tube:860] Intake/Output this shift: Total I/O In: 53.4 [I.V.:53.4] Out: 22 [Other:22]  General appearance: illappearing Resp: on vent, 80%/peep5, very small catheter in R chest with serous output, no air leak Cardio: regular rate and rhythm GI: soft, nondistended, incisions c/d/I without cellulitis or drainage, appropriate tenderness   Lab Results:  Recent Labs    05/04/21 1632 05/04/21 1643 05/04/21 2049 05/05/21 0001  WBC 12.3*  --   --  7.1  HGB 10.8*   < > 11.9* 11.7*  HCT 37.3   < > 35.0* 36.3  PLT 168  --   --  121*   < > = values in this interval not displayed.   BMET Recent Labs    05/04/21 1632 05/04/21 1643 05/04/21 1644 05/04/21 1835 05/04/21 2049 05/05/21 0001  NA 140   < > 133*   < > 139 139  K 4.4   < > 8.0*   < > 4.4 4.5  CL 106  --  111  --   --  98  CO2 8*  --   --   --   --  17*  GLUCOSE 118*  --  102*  --   --  151*  BUN 38*  --  60*  --   --  38*  CREATININE 4.07*  --  4.40*  --   --  3.11*  CALCIUM 10.0  --   --   --   --  8.7*   < > = values in this interval not displayed.   PT/INR Recent Labs    05/05/21 0001  LABPROT 21.5*  INR 1.9*   ABG Recent Labs    05/04/21 1835 05/04/21 2049  PHART 7.176* 7.304*  HCO3 12.7* 11.6*     Studies/Results: CT ABDOMEN PELVIS WO CONTRAST  Result Date: 05/04/2021 CLINICAL DATA:  Pneumothorax, abdominal distension per order. Per Dr. Robby Sermon request: the nurse pushed 55m contrast just before scanning through NG tube to rule out pooling in the chest. Cholecystectomy performed on 05/02/2021. EXAM: CT CHEST, ABDOMEN AND PELVIS WITHOUT CONTRAST TECHNIQUE: Multidetector CT imaging of the chest, abdomen and pelvis was performed following the standard protocol without IV contrast. COMPARISON:  Current chest radiograph. Abdomen and pelvis CT, 07/28/2008. Right upper quadrant ultrasound, 01/21/2021. FINDINGS: CT CHEST FINDINGS Cardiovascular: Heart is normal in size. Three-vessel coronary artery calcifications. No pericardial effusion. Great vessels are top-normal in size. Mild aortic atherosclerotic calcifications. Mediastinum/Nodes: Endotracheal tube tip lies 2 cm above the carinal. No neck base, mediastinal or hilar masses. No enlarged lymph nodes. Trachea is unremarkable. There is contrast within the esophagus. Nasogastric tube tip terminates in the distal esophagus. Lungs/Pleura: Right-sided chest tube has its tip in the posterior mid right hemithorax. Right-sided hydropneumothorax. Pneumothorax is approximately 40%. Small 2 moderate amount of pleural fluid with intermixed bubbles of  air. Fluid appears partly loculated. Trace amount of left pleural fluid. No left pneumothorax. There is opacity in the posterior aspect of the right upper lobe and middle lobe with collapse/consolidation of the right lower lobe. Minimal dependent atelectasis is noted on the left. Left lung otherwise clear. Musculoskeletal: No fracture or acute finding.  No bone lesion. CT ABDOMEN PELVIS FINDINGS Hepatobiliary: No focal liver abnormality is seen. Status post cholecystectomy. No biliary dilatation. Pancreas: Unremarkable. No pancreatic ductal dilatation or surrounding inflammatory changes. Spleen: Relatively small  spleen.  No mass. Adrenals/Urinary Tract: No adrenal masses. Bilateral renal masses. 6 cm hypoattenuating mass arises from the lower pole of the right kidney. Somewhat more complex appearing mass, also hypoattenuating, arises from the mid to upper pole the left kidney, 5.9 cm in size. Smaller round mass arises from the anterior lower pole the left kidney, 2 cm in size. These are all consistent with cysts. No other renal masses, no stones and no hydronephrosis. Ureters are normal in course and in caliber. Bladder is mostly decompressed with a Foley catheter. Stomach/Bowel: Stomach is unremarkable. Small bowel and colon are normal in caliber. No wall thickening. No inflammation. Normal appendix visualized. Vascular/Lymphatic: Aortic atherosclerosis. No enlarged abdominal or pelvic lymph nodes. Reproductive: Unremarkable. Other: Small bubbles of air are noted in the anterior abdominal wall subcutaneous fat consistent with postoperative air. There is hazy inflammatory type change in the upper abdomen centered on the porta hepatis, also consistent with expected postoperative change. No ascites. No abdominal wall hernia. Musculoskeletal: No fracture or acute finding.  No bone lesion. IMPRESSION: 1. Right hydropneumothorax. Pneumothorax is approximately 40%. There is partly loculated pleural fluid and contains bubbles of air. A right-sided chest tube has its tip lying in the posterior right mid hemithorax. 2. Consolidation/atelectasis in the dependent right lung, involving the entire right lower lobe. A component of infection should be considered in the proper clinical setting. 3. Nasogastric tube tip lies in the distal esophagus. 4. Hazy opacity noted in the upper abdomen centered on the porta hepatis consistent with the expected postoperative change following the recent cholecystectomy. There is no abscess or evidence of an operative complication. 5. Coronary artery and aortic atherosclerosis. 6. Well-positioned  endotracheal tube. Electronically Signed   By: Lajean Manes M.D.   On: 05/04/2021 18:35   CT HEAD WO CONTRAST  Result Date: 05/04/2021 CLINICAL DATA:  80 year old female with altered mental status. EXAM: CT HEAD WITHOUT CONTRAST TECHNIQUE: Contiguous axial images were obtained from the base of the skull through the vertex without intravenous contrast. COMPARISON:  None. FINDINGS: Brain: Mild age-related atrophy and moderate chronic microvascular ischemic changes. There is no acute intracranial hemorrhage. No mass effect or midline shift. No extra-axial fluid collection. Vascular: No hyperdense vessel or unexpected calcification. Skull: Normal. Negative for fracture or focal lesion. Sinuses/Orbits: No acute finding. Other: An endotracheal and enteric tube are partially visualized. IMPRESSION: 1. No acute intracranial pathology. 2. Age-related atrophy and chronic microvascular ischemic changes. Electronically Signed   By: Anner Crete M.D.   On: 05/04/2021 18:19   CT Chest Wo Contrast  Result Date: 05/04/2021 CLINICAL DATA:  Pneumothorax, abdominal distension per order. Per Dr. Robby Sermon request: the nurse pushed 51m contrast just before scanning through NG tube to rule out pooling in the chest. Cholecystectomy performed on 05/02/2021. EXAM: CT CHEST, ABDOMEN AND PELVIS WITHOUT CONTRAST TECHNIQUE: Multidetector CT imaging of the chest, abdomen and pelvis was performed following the standard protocol without IV contrast. COMPARISON:  Current chest radiograph. Abdomen and  pelvis CT, 07/28/2008. Right upper quadrant ultrasound, 01/21/2021. FINDINGS: CT CHEST FINDINGS Cardiovascular: Heart is normal in size. Three-vessel coronary artery calcifications. No pericardial effusion. Great vessels are top-normal in size. Mild aortic atherosclerotic calcifications. Mediastinum/Nodes: Endotracheal tube tip lies 2 cm above the carinal. No neck base, mediastinal or hilar masses. No enlarged lymph nodes. Trachea is  unremarkable. There is contrast within the esophagus. Nasogastric tube tip terminates in the distal esophagus. Lungs/Pleura: Right-sided chest tube has its tip in the posterior mid right hemithorax. Right-sided hydropneumothorax. Pneumothorax is approximately 40%. Small 2 moderate amount of pleural fluid with intermixed bubbles of air. Fluid appears partly loculated. Trace amount of left pleural fluid. No left pneumothorax. There is opacity in the posterior aspect of the right upper lobe and middle lobe with collapse/consolidation of the right lower lobe. Minimal dependent atelectasis is noted on the left. Left lung otherwise clear. Musculoskeletal: No fracture or acute finding.  No bone lesion. CT ABDOMEN PELVIS FINDINGS Hepatobiliary: No focal liver abnormality is seen. Status post cholecystectomy. No biliary dilatation. Pancreas: Unremarkable. No pancreatic ductal dilatation or surrounding inflammatory changes. Spleen: Relatively small spleen.  No mass. Adrenals/Urinary Tract: No adrenal masses. Bilateral renal masses. 6 cm hypoattenuating mass arises from the lower pole of the right kidney. Somewhat more complex appearing mass, also hypoattenuating, arises from the mid to upper pole the left kidney, 5.9 cm in size. Smaller round mass arises from the anterior lower pole the left kidney, 2 cm in size. These are all consistent with cysts. No other renal masses, no stones and no hydronephrosis. Ureters are normal in course and in caliber. Bladder is mostly decompressed with a Foley catheter. Stomach/Bowel: Stomach is unremarkable. Small bowel and colon are normal in caliber. No wall thickening. No inflammation. Normal appendix visualized. Vascular/Lymphatic: Aortic atherosclerosis. No enlarged abdominal or pelvic lymph nodes. Reproductive: Unremarkable. Other: Small bubbles of air are noted in the anterior abdominal wall subcutaneous fat consistent with postoperative air. There is hazy inflammatory type change in  the upper abdomen centered on the porta hepatis, also consistent with expected postoperative change. No ascites. No abdominal wall hernia. Musculoskeletal: No fracture or acute finding.  No bone lesion. IMPRESSION: 1. Right hydropneumothorax. Pneumothorax is approximately 40%. There is partly loculated pleural fluid and contains bubbles of air. A right-sided chest tube has its tip lying in the posterior right mid hemithorax. 2. Consolidation/atelectasis in the dependent right lung, involving the entire right lower lobe. A component of infection should be considered in the proper clinical setting. 3. Nasogastric tube tip lies in the distal esophagus. 4. Hazy opacity noted in the upper abdomen centered on the porta hepatis consistent with the expected postoperative change following the recent cholecystectomy. There is no abscess or evidence of an operative complication. 5. Coronary artery and aortic atherosclerosis. 6. Well-positioned endotracheal tube. Electronically Signed   By: Lajean Manes M.D.   On: 05/04/2021 18:35   DG Chest Port 1 View  Result Date: 05/05/2021 CLINICAL DATA:  Central line placement, altered mental status EXAM: PORTABLE CHEST 1 VIEW COMPARISON:  Radiograph 05/04/2021 FINDINGS: Right pneumothorax is similar to slightly increased from comparison prior radiography despite stable positioning of a right pleural catheter directed apically. Accounting for differences in positioning including a steep left anterior obliquity. Dual lumen right IJ approach central venous catheter tip terminates at the level of the right atrium. Endotracheal tube tip terminates 3.6 cm from the carina. Transesophageal tube tip and side port terminate beyond the GE junction, below the margins of  imaging. Telemetry leads and external support devices overlie the chest. Stable cardiomegaly, cardiomediastinal contours and evidence of prior coronary stenting. Some persistent hazy opacity present in left lung base, favor  atelectatic. Additional atelectatic changes and volume loss in the partially collapsed right. IMPRESSION: Right pneumothorax is similar to slightly increased in size accounting for differences in patient positioning. This is despite the continued presence of a right pleural catheter. Additional lines and tubes as above. Bibasilar hazy opacities, favoring atelectatic change with additional volume loss in the partially collapsed right lung. Stable cardiomegaly, coronary and aortic atherosclerosis. Prior coronary stenting. Electronically Signed   By: Lovena Le M.D.   On: 05/05/2021 02:23   DG Chest Port 1 View  Result Date: 05/04/2021 CLINICAL DATA:  Pneumothorax EXAM: PORTABLE CHEST 1 VIEW COMPARISON:  05/04/2021 at 4:17 p.m. FINDINGS: Unchanged appearance of endotracheal tube and esophageal catheter. The right pneumothorax is decreased in size following placement a right chest wall catheter. Left lung is clear. IMPRESSION: Decrease in size of right pneumothorax following chest wall catheter placement. Electronically Signed   By: Ulyses Jarred M.D.   On: 05/04/2021 22:33   DG Chest Port 1 View  Result Date: 05/04/2021 CLINICAL DATA:  sob EXAM: PORTABLE CHEST 1 VIEW COMPARISON:  March 29, 2021 FINDINGS: Evaluation is limited secondary to mode tubal overlying lines. The cardiomediastinal silhouette is enlarged in contour.Atherosclerotic calcifications of the aorta. Large RIGHT pleural effusion. ETT tip terminates over the RIGHT mainstem bronchus. The enteric tube courses through the chest to the abdomen beyond the field-of-view. No pneumothorax. Bibasilar homogeneous opacities, likely atelectasis. Visualized abdomen is unremarkable. Multilevel degenerative changes of the thoracic spine. IMPRESSION: 1.  ETT tip terminates over the RIGHT mainstem bronchus. 2.  Large loculated appearing RIGHT-sided pleural effusion. These results were called by telephone at the time of interpretation on 05/04/2021 at 4:29 pm to  provider [facility] , who verbally acknowledged these results. Electronically Signed   By: Valentino Saxon MD   On: 05/04/2021 16:31    Anti-infectives: Anti-infectives (From admission, onward)    Start     Dose/Rate Route Frequency Ordered Stop   05/05/21 1000  piperacillin-tazobactam (ZOSYN) IVPB 3.375 g  Status:  Discontinued        3.375 g 12.5 mL/hr over 240 Minutes Intravenous Every 12 hours 05/04/21 2021 05/04/21 2334   05/05/21 0400  piperacillin-tazobactam (ZOSYN) IVPB 3.375 g        3.375 g 100 mL/hr over 30 Minutes Intravenous Every 6 hours 05/04/21 2335     05/05/21 0100  doxycycline (VIBRAMYCIN) 100 mg in sodium chloride 0.9 % 250 mL IVPB        100 mg 125 mL/hr over 120 Minutes Intravenous Every 12 hours 05/05/21 0003     05/04/21 2115  piperacillin-tazobactam (ZOSYN) IVPB 3.375 g        3.375 g 100 mL/hr over 30 Minutes Intravenous STAT 05/04/21 2016 05/04/21 2153       Assessment/Plan: Uneventful lap chole 7/21 Shortness of breath/shock starting POD 2 with VDRF, ARF requiring CRRT for lactic acidosis,  Large loculated pleural effusion/hydroptx s/p placement of thoracentesis catheter in ER 7/23/ Etiology is unclear- I am not convinced based on presentation and CT that this is related to her gallbladder surgery though that must certainly be kept in consideration Pleural fluid studies with elevated amylase and LD- ?esophageal perf higher up? Looks like some contrast in her mediastinum on the axials image 30-32.    Addendum [id number]: I went over her  CT with the radiologist on call this morning. I do think there is extraluminal contrast in the mediastinum as noted above and he also notes an outpouching of the esophageal wall on the coronal images 67-68 which may be site of perforation, a few cm inferior to carina. Clinical presentation supports this with the degree of shock/ multisystem organ failure she has developed and loculated R pleural effusion. I discussed with CCM  team who will contact TCTS.    Total critical care time: 30 minutes Critical care time was exclusive of separately billable procedures and treating other patients. Critical care was necessary to treat or prevent imminent or life-threatening deterioration.  Critical care was time spent personally by me on the following activities: development of treatment plan with patient and/or surrogate as well as nursing, discussions with consultants, evaluation of patient's response to treatment, examination of patient, obtaining history from patient or surrogate, ordering and performing treatments and interventions, ordering and review of laboratory studies, ordering and review of radiographic studies, pulse oximetry and re-evaluation of patient's condition.     LOS: 1 day    Dana Coleman 05/05/2021

## 2021-05-05 NOTE — Transfer of Care (Signed)
Immediate Anesthesia Transfer of Care Note  Patient: Dana Coleman  Procedure(s) Performed: THORACOTOMY MAJOR REPAIR PERFORATED ESOPHAGUS (Right: Chest)  Patient Location: ICU  Anesthesia Type:General  Level of Consciousness: sedated and Patient remains intubated per anesthesia plan  Airway & Oxygen Therapy: Patient remains intubated per anesthesia plan and Patient placed on Ventilator (see vital sign flow sheet for setting)  Post-op Assessment: Report given to RN and Post -op Vital signs reviewed and stable  Post vital signs: Reviewed and stable  Last Vitals:  Vitals Value Taken Time  BP 148/55   Temp    Pulse 84   Resp 14   SpO2      Last Pain:  Vitals:   05/04/21 1623  TempSrc: Rectal         Complications: No notable events documented.  Transported with 100% fi02 via ambubag. Full monitors.  Still unable to obtain saturation.  VSS for transport.  norepi off.  Fentanyl increased from 50-100

## 2021-05-05 NOTE — Progress Notes (Signed)
CRRT stopped for patient to go to xray. When pt returned from xray Dr.Bartle in to see pt for possible surgery. Dr Cyndia Bent spoke with family consent over the phone obtained. Family in to see patient prior to surgery. Pt to surgery with RN and CRNA

## 2021-05-05 NOTE — Progress Notes (Signed)
eLink Physician-Brief Progress Note Patient Name: Dana Coleman DOB: September 16, 1941 MRN: [id number]   Date of Service  05/05/2021  HPI/Events of Note  Reviewed CXR obtained after insertion of R IJ HD line. Line is in appropriate position. Persistent pneumothorax (unchanged in size) on R side with chest tube in place (position unchanged). ETT remains in appropriate position.   eICU Interventions  OK to use R IJ HD line. RN made aware of this so CRRT can be started.     Intervention Category Minor Interventions: Routine modifications to care plan (e.g. PRN medications for pain, fever)  Dana Coleman 05/05/2021, 1:39 AM

## 2021-05-05 NOTE — Procedures (Signed)
Central Venous Catheter Insertion Procedure Note  Dana Coleman  [id number]  07-Dec-1940  Date:05/05/21  Time:1:25 AM   Provider Performing:Seong-Joo Carson Myrtle   Procedure: Insertion of Non-tunneled Central Venous Catheter(36556) with US guidance [id number])   Indication(s) Hemodialysis  Consent Unable to obtain consent due to emergent nature of procedure.  Anesthesia Topical only with 1% lidocaine   Timeout Verified patient identification, verified procedure, site/side was marked, verified correct patient position, special equipment/implants available, medications/allergies/relevant history reviewed, required imaging and test results available.  Sterile Technique Maximal sterile technique including full sterile barrier drape, hand hygiene, sterile gown, sterile gloves, mask, hair covering, sterile ultrasound probe cover (if used).  Procedure Description Area of catheter insertion was cleaned with chlorhexidine and draped in sterile fashion.  With real-time ultrasound guidance a HD catheter was placed into the right internal jugular vein. Nonpulsatile blood flow and easy flushing noted in all ports.  The catheter was sutured in place and sterile dressing applied.  Complications/Tolerance None; patient tolerated the procedure well. Chest X-ray is ordered to verify placement for internal jugular or subclavian cannulation.     EBL Minimal  Specimen(s) None  Renee Pain, MD Board Certified by the [employer], [place]

## 2021-05-06 ENCOUNTER — Encounter: Payer: Medicare Other | Admitting: Surgery

## 2021-05-06 ENCOUNTER — Inpatient Hospital Stay (HOSPITAL_COMMUNITY): Payer: Medicare Other

## 2021-05-06 ENCOUNTER — Encounter (HOSPITAL_COMMUNITY): Payer: Self-pay | Admitting: Surgery

## 2021-05-06 DIAGNOSIS — J9601 Acute respiratory failure with hypoxia: Secondary | ICD-10-CM | POA: Diagnosis not present

## 2021-05-06 DIAGNOSIS — R9431 Abnormal electrocardiogram [ECG] [EKG]: Secondary | ICD-10-CM

## 2021-05-06 DIAGNOSIS — N179 Acute kidney failure, unspecified: Secondary | ICD-10-CM | POA: Diagnosis not present

## 2021-05-06 LAB — POCT I-STAT 7, (LYTES, BLD GAS, ICA,H+H)
Acid-Base Excess: 2 mmol/L (ref 0.0–2.0)
Bicarbonate: 26.9 mmol/L (ref 20.0–28.0)
Calcium, Ion: 1.08 mmol/L — ABNORMAL LOW (ref 1.15–1.40)
HCT: 32 % — ABNORMAL LOW (ref 36.0–46.0)
Hemoglobin: 10.9 g/dL — ABNORMAL LOW (ref 12.0–15.0)
O2 Saturation: 98 %
Patient temperature: 37.5
Potassium: 5.3 mmol/L — ABNORMAL HIGH (ref 3.5–5.1)
Sodium: 135 mmol/L (ref 135–145)
TCO2: 28 mmol/L (ref 22–32)
pCO2 arterial: 42.1 mmHg (ref 32.0–48.0)
pH, Arterial: 7.415 (ref 7.350–7.450)
pO2, Arterial: 105 mmHg (ref 83.0–108.0)

## 2021-05-06 LAB — BASIC METABOLIC PANEL
Anion gap: 13 (ref 5–15)
Anion gap: 12 (ref 5–15)
BUN: 26 mg/dL — ABNORMAL HIGH (ref 8–23)
BUN: 29 mg/dL — ABNORMAL HIGH (ref 8–23)
CO2: 23 mmol/L (ref 22–32)
CO2: 23 mmol/L (ref 22–32)
Calcium: 7.9 mg/dL — ABNORMAL LOW (ref 8.9–10.3)
Calcium: 7.8 mg/dL — ABNORMAL LOW (ref 8.9–10.3)
Chloride: 99 mmol/L (ref 98–111)
Chloride: 99 mmol/L (ref 98–111)
Creatinine, Ser: 1.87 mg/dL — ABNORMAL HIGH (ref 0.44–1.00)
Creatinine, Ser: 1.9 mg/dL — ABNORMAL HIGH (ref 0.44–1.00)
GFR, Estimated: 27 mL/min — ABNORMAL LOW (ref >60)
GFR, Estimated: 27 mL/min — ABNORMAL LOW (ref >60)
Glucose, Bld: 187 mg/dL — ABNORMAL HIGH (ref 70–99)
Glucose, Bld: 257 mg/dL — ABNORMAL HIGH (ref 70–99)
Potassium: 4.7 mmol/L (ref 3.5–5.1)
Potassium: 4.4 mmol/L (ref 3.5–5.1)
Sodium: 135 mmol/L (ref 135–145)
Sodium: 134 mmol/L — ABNORMAL LOW (ref 135–145)

## 2021-05-06 LAB — CBC
HCT: 33.6 % — ABNORMAL LOW (ref 36.0–46.0)
Hemoglobin: 11.2 g/dL — ABNORMAL LOW (ref 12.0–15.0)
MCH: 29.6 pg (ref 26.0–34.0)
MCHC: 33.3 g/dL (ref 30.0–36.0)
MCV: 88.7 fL (ref 80.0–100.0)
Platelets: 112 10*3/uL — ABNORMAL LOW (ref 150–400)
RBC: 3.79 MIL/uL — ABNORMAL LOW (ref 3.87–5.11)
RDW: 15 % (ref 11.5–15.5)
WBC: 13.4 10*3/uL — ABNORMAL HIGH (ref 4.0–10.5)
nRBC: 0.3 % — ABNORMAL HIGH (ref 0.0–0.2)

## 2021-05-06 LAB — GLUCOSE, CAPILLARY
Glucose-Capillary: 99 mg/dL (ref 70–99)
Glucose-Capillary: 239 mg/dL — ABNORMAL HIGH (ref 70–99)
Glucose-Capillary: 280 mg/dL — ABNORMAL HIGH (ref 70–99)
Glucose-Capillary: 207 mg/dL — ABNORMAL HIGH (ref 70–99)
Glucose-Capillary: 136 mg/dL — ABNORMAL HIGH (ref 70–99)
Glucose-Capillary: 117 mg/dL — ABNORMAL HIGH (ref 70–99)

## 2021-05-06 LAB — TROPONIN I (HIGH SENSITIVITY)
Troponin I (High Sensitivity): 12201 ng/L (ref <18)
Troponin I (High Sensitivity): 10038 ng/L (ref <18)
Troponin I (High Sensitivity): 9957 ng/L (ref <18)

## 2021-05-06 LAB — ECHOCARDIOGRAM COMPLETE
Area-P 1/2: 3.31 cm2
Height: 61 in
S' Lateral: 3.2 cm
Weight: 2045.87 oz

## 2021-05-06 LAB — MAGNESIUM: Magnesium: 2.7 mg/dL — ABNORMAL HIGH (ref 1.7–2.4)

## 2021-05-06 LAB — TRIGLYCERIDES, BODY FLUIDS: Triglycerides, Fluid: 31 mg/dL

## 2021-05-06 MED ORDER — LACTATED RINGERS IV SOLN: INTRAVENOUS | Status: DC

## 2021-05-06 MED ORDER — ATROPINE SULFATE 1 MG/10ML IJ SOSY
PREFILLED_SYRINGE | INTRAMUSCULAR | Status: AC
  Filled 2021-05-06: qty 10

## 2021-05-06 MED ORDER — ACETAMINOPHEN 325 MG RE SUPP
325.0000 mg | RECTAL | Status: DC | PRN
  Administered 2021-05-07 – 2021-05-08 (×4): 325 mg via RECTAL
  Filled 2021-05-06 (×4): qty 1

## 2021-05-06 MED ORDER — DOPAMINE-DEXTROSE 3.2-5 MG/ML-% IV SOLN
0.0000 ug/kg/min | INTRAVENOUS | Status: DC
  Administered 2021-05-06: 5 ug/kg/min via INTRAVENOUS

## 2021-05-06 MED ORDER — PERFLUTREN LIPID MICROSPHERE
1.0000 mL | INTRAVENOUS | Status: AC | PRN
  Administered 2021-05-06: 2 mL via INTRAVENOUS
  Filled 2021-05-06: qty 10

## 2021-05-06 MED ORDER — DOPAMINE-DEXTROSE 3.2-5 MG/ML-% IV SOLN
INTRAVENOUS | Status: AC
  Filled 2021-05-06: qty 250

## 2021-05-06 NOTE — Progress Notes (Signed)
eLink Physician-Brief Progress Note Patient Name: Dana Coleman DOB: 1941-06-16 MRN: [id number]   Date of Service  05/06/2021  HPI/Events of Note  Notified of Troponin 12,201 Patient not more stable on dopamine  Patient also had bradycardic episodes on presentation. No baseline troponins at that time.  eICU Interventions  Unable to anticoagulate given recent surgery Will trend troponin     Intervention Category Intermediate Interventions: Other:  Judd Lien 05/06/2021, 5:44 AM

## 2021-05-06 NOTE — Progress Notes (Signed)
NAME:  Dana Coleman, MRN:  [id number], DOB:  11/06/1940, LOS: 2 ADMISSION DATE:  05/04/2021, CONSULTATION DATE:  05/04/21 REFERRING MD:  Ralene Bathe, CHIEF COMPLAINT:  SOB  History of Present Illness:  Post op GB surgery Worsened abd post post Brought in by EMS, began becoming brady, hypoxemic to 70s, intubated CXR with large loculated R effusion CTA ordered Labs pending PCCM consulted for admission Patient currently intubated and sedated  Pertinent  Medical History  Prior R breast cancer DM CAD HLD HTN  Significant Hospital Events: Including procedures, antibiotic start and stop dates in addition to other pertinent events   7/23 admitted intubated. Pigtail by CCS in ED -- interestingly seems to have bilious outpt  7/24 Starting CRRT. Abundant GPCs in pleural fluid 7/24 1.  Right thoracotomy 2.  Repair of esophageal perforation with intercostal pedicled muscle flap. 3.  Drainage of empyema and decortication of right lung 7/25 early AM EKG changes, trop leak, probable inferiror NSTEMI, not candidate for intervention  Interim History / Subjective:  No events. Awake. Making urine. Rhythm issues and trop leak noted.  Objective   Blood pressure 121/66, pulse 60, temperature 98.96 F (37.2 C), resp. rate 16, height '5\' 1"'$  (1.549 m), weight 58 kg, SpO2 94 %. CVP:  [0 mmHg-18 mmHg] 9 mmHg  Vent Mode: PRVC FiO2 (%):  [40 %-100 %] 40 % Set Rate:  [14 bmp-26 bmp] 16 bmp Vt Set:  [440 mL-450 mL] 440 mL PEEP:  [5 cmH20] 5 cmH20 Plateau Pressure:  [18 cmH20-21 cmH20] 19 cmH20   Intake/Output Summary (Last 24 hours) at 05/06/2021 0758 Last data filed at 05/06/2021 0700 Gross per 24 hour  Intake 3218.24 ml  Output 2157 ml  Net 1061.24 ml    Filed Weights   05/04/21 1625  Weight: 58 kg    Examination: No distress Chest tube with cloudy output, no air leak Lungs with rhonci worse on right Abdomen soft with hypoactive BS OGT with yellow debris filled material Moving all 4 ext  to command  Trop 12k>>10k CBC stable Sugars a bit high CXR looks really good!  Resolved Hospital Problem list     Assessment & Plan:  Esophageal perforation with septic shock post flap repair 7/24 Acute hypoxemic respiratory failure from strep/yeast empyema from perforation post decortication 7/24 Septic ATN improving Post op lap chole 7/21 NSTEMI type 1 vs. 2, had signs of old inferior MI on previous stress test so could just be this defect exacerbated by shock, not PCI candidate at present DM2 with hyperglycemia Afib slow VR- wonder if vagal irritation but could be from NSTEMI as above  - Wean pressors to MAP 65 - Dopamine fine for now for heart rate - Switch fluids to LR - Continue NPO for now - Drain management per TCTS - Eraxis and zosyn duration TBD - f/u echo this AM and consider SAT/SBT later today - Monitor UoP, hopefully do not need to restart CRRT - SSI for now, consider long acting depending on trend without supplemental IV glucose  Best Practice (right click and "Reselect all SmartList Selections" daily)   Diet/type: NPO DVT prophylaxis: SCD GI prophylaxis: N/A Lines: N/A Foley:  Yes, and it is still needed Code Status:  full code Last date of multidisciplinary goals of care discussion- full recovery hoped for   Patient critically ill due to shock, respiratory failure Interventions to address this today pressor and vent titration Risk of deterioration without these interventions is high  I personally spent 42 minutes  providing critical care not including any separately billable procedures  Erskine Emery MD [facility]  Prefer epic messenger for cross cover needs If after hours, please call E-link

## 2021-05-06 NOTE — Significant Event (Addendum)
S: Requested to come to bedside by nursing staff. Troponin I- 12201. Patient with new onset bradycardia overnight. Started on dopamine with improvement in HR. 0355 12-lead with slight elevation in inferior leads.  O: Blood pressure 136/64, pulse 75, temperature 99.1 F (37.3 C), resp. rate 17, height '5\' 1"'$  (1.549 m), weight 58 kg, SpO2 100 %.  Levophed 18 mcg/min Dopamine 39mg/kg/min Fentanyl 150 mcg/min  CV: S1S2, irregular rhythm, no m/r/g appreciated PULM:  air movement in the upper lobes and in the lower lobes, trachea midline, chest expansion symmetric   A: Acute ACS- ?intraoperative  P: Dr. BCyndia Bentat bedside. Dr. JCarson Myrtleand I discussed case,  troponin and 12 lead with Dr. BCyndia Bent Unable to anticoagulate due to recent surgery. Patient remains hemodynamically stable. Obtained another 12 lead- inferior elevation less prominent compared to previous image. K 4.4, MG 2.7. PH 7.41 -Not a candidate for anticoagulation or intervention. Dr. BCyndia Bentin agreement. -Continue to trend troponin -Continue dopamine -Continue supportive care -Obtain ECHO  TRedmond School, MSN, APRN, AGACNP-BC [facility]  05/06/2021 , 6:15 AM  Please see [email or web address] for pager details  If no response, please call [phone] After hours, please call Elink at [phone]

## 2021-05-06 NOTE — Progress Notes (Signed)
1 Day Post-Op Procedure(s) (LRB): THORACOTOMY MAJOR REPAIR PERFORATED ESOPHAGUS (Right) Subjective: Intubated and sedated on vent  Has been hemodynamically fairly stable overnight on NE but has been in atrial fib with recurrent episodes of bradycardia into the 20's-40's. Started on dopamine 10 by CCM with increase in HR to 70's but still AF. Lytes ok, ABG ok, CXR ok. ECG showed some inferior ST elevation, IRBBB. Troponin 12,201. ECG looks a little better this am with AF, RBBB. 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   Objective: Vital signs in last 24 hours: Temp:  [98.4 F (36.9 C)-101.12 F (38.4 C)] 99.1 F (37.3 C) (07/25 0600) Pulse Rate:  [44-100] 75 (07/25 0600) Cardiac Rhythm: Heart block (07/25 0400) Resp:  [0-28] 17 (07/25 0600) BP: (75-139)/(49-75) 136/64 (07/25 0600) SpO2:  [84 %-100 %] 100 % (07/25 0600) Arterial Line BP: (57-153)/(42-89) 135/61 (07/25 0600) FiO2 (%):  [40 %-100 %] 40 % (07/25 0418)  Hemodynamic parameters for last 24 hours: CVP:  [0 mmHg-18 mmHg] 9 mmHg  Intake/Output from previous day: 07/24 0701 - 07/25 0700 In: 2680.3 [I.V.:2018.2; IV Piggyback:662.1] Out: 1807 [Urine:965; Blood:400; Chest Tube:380] Intake/Output this shift: Total I/O In: 1152.4 [I.V.:1040.3; IV Piggyback:112.1] Out: 30 [Urine:600; Chest Tube:280]  General appearance: intubated and sedated on vent Neurologic: fluttering eyelids but not following commands Heart: irregularly irregular rhythm Lungs: clear to auscultation bilaterally Abdomen: soft, non-distended Extremities: edema mild  Wound: dressing dry  Lab Results: Recent Labs    05/05/21 1938 05/05/21 1939 05/06/21 0113 05/06/21 0401  WBC 9.6  --   --  13.4*  HGB 11.2*   < > 10.9* 11.2*  HCT 32.8*   < > 32.0* 33.6*  PLT 98*  --   --  112*   < > = values in this interval not  displayed.   BMET:  Recent Labs    05/06/21 0119 05/06/21 0401  NA 135 134*  K 4.7 4.4  CL 99 99  CO2 23 23  GLUCOSE 187* 257*  BUN 26* 29*  CREATININE 1.87* 1.90*  CALCIUM 7.9* 7.8*    PT/INR:  Recent Labs    05/05/21 0803  LABPROT 21.6*  INR 1.9*   ABG    Component Value Date/Time   PHART 7.415 05/06/2021 0113   HCO3 26.9 05/06/2021 0113   TCO2 28 05/06/2021 0113   ACIDBASEDEF 14.0 (H) 05/04/2021 2049   O2SAT 98.0 05/06/2021 0113   CBG (last 3)  Recent Labs    05/05/21 1403 05/05/21 2333 05/06/21 0405  GLUCAP 70 125* 239*   CXR: looks ok, tubes all in appropriate position. Good aeration considering.  Assessment/Plan: S/P Procedure(s) (LRB): THORACOTOMY MAJOR REPAIR PERFORATED ESOPHAGUS (Right)  POD 1 Hemodynamically stable on NE 18, dop 10 for bradycardia. Atrial fib with bradycardia postop. Markedly elevated troponin and some inferior ECG changes. She has hx of inferior MI in past and some mild distal anterior/apical ischemia on nuclear stress in 2019 but felt to be low risk and no ongoing followup that I can find. She is not in any condition for cath or intervention or anticoagulation so I think we just have to support her for now.   VDRF: management per CCM  Non-oliguric renal failure improved and now making decent urine. Follow lytes and creat. Avoid hypovolemia and hypotension.  Broad spectrum antibiotics for sepsis from severe mediastinal and pleural  space contamination and infection.  Keep chest tubes in.  Nothing per OG tube.   Will need parenteral nutrition for now.   LOS: 2 days    Gaye Pollack 05/06/2021

## 2021-05-06 NOTE — Progress Notes (Addendum)
LV a bit down, RV looks dilated on 4 chamber but that could just be angle, RV contractility looks good. Going to let her rest today while awaiting formal read and assuring hemodynamic stability.  Erskine Emery MD PCCM

## 2021-05-06 NOTE — Progress Notes (Addendum)
Called and updated sister. Plan to extubate early tomorrow AM provided BP/HR look okay.

## 2021-05-06 NOTE — Progress Notes (Addendum)
Initial Nutrition Assessment  DOCUMENTATION CODES:   Not applicable  INTERVENTION:  TPN order per Pharmacy to meet 100% estimated nutritional needs   NUTRITION DIAGNOSIS:   Inadequate oral intake related to altered GI function, acute illness as evidenced by NPO status.  GOAL:   Patient will meet greater than or equal to 90% of their needs  MONITOR:   Diet advancement, I & O's, Labs, Weight trends, Skin (TPN)  REASON FOR ASSESSMENT:   Rounds    ASSESSMENT:   80 yo female admitted post recent lap cholecystectomy with acute respiratory failure and renal failure with severe lactic acidosis from sepsis and found to have esophageal perforation, empyema. PMH includes DM, HTN, HLD, CAD  7/21 Lap chole 7/23 Admitted, Intubated, Pigtail by CCS in ED with bilious output 7/24 CRRT initiated, R Thoracotomy, repair of esophageal perforation, drainage of empyema and decortication of right lung 7/26 Extubated  Currently NPO. PICC line to be placed with initiation of TPN today Per Dr. Cyndia Bent, pt will have a barium swallow in 1 week and if no leak pt will be able to start eating slowly.   Alert/awake post extubation ,remains on pressors  CRRT currently on hold  Current wt 58 kg  CBGs improved with change in IVF to LR from D5-NS  Chest tubes with 90 mL out yesterday, 100 mL thus far today  UOP 1420 mL in 24 hours  Labs: CBGs 97-143 (ICU goal 140-180) Meds: ss novolog   Diet Order:   Diet Order             Diet NPO time specified  Diet effective now                   EDUCATION NEEDS:   Not appropriate for education at this time  Skin:  Skin Assessment: Skin Integrity Issues: Skin Integrity Issues:: Stage I, Incisions Stage I: sacrum Incisions: abdomen/chest  Last BM:  no documented BM  Height:   Ht Readings from Last 1 Encounters:  05/04/21 '5\' 1"'$  (1.549 m)    Weight:   Wt Readings from Last 1 Encounters:  05/04/21 58 kg     BMI:  Body mass  index is 24.16 kg/m.  Estimated Nutritional Needs:   Kcal:  1750-2030 kcals  Protein:  87-116 g  Fluid:  >/= 1.8 L   Kerman Passey MS, RDN, LDN, CNSC Registered Dietitian III Clinical Nutrition RD Pager and On-Call Pager Number Located in [place]

## 2021-05-06 NOTE — Progress Notes (Signed)
Date and time results received: 05/06/21    Test: Troponin Critical Value: 12,201  Name of Provider Notified: Dr. Genevive Bi  Orders Received? Or Actions Taken?:  No new orders at this time.

## 2021-05-06 NOTE — Progress Notes (Signed)
Progress Note  1 Day Post-Op  Subjective: On the vent, awakens to voice/touch. A. Fib with bradycardia overnight.   Objective: Vital signs in last 24 hours: Temp:  [98.4 F (36.9 C)-101.12 F (38.4 C)] 98.96 F (37.2 C) (07/25 0743) Pulse Rate:  [44-100] 60 (07/25 0743) Resp:  [0-28] 16 (07/25 0743) BP: (75-142)/(49-76) 121/66 (07/25 0743) SpO2:  [84 %-100 %] 94 % (07/25 0744) Arterial Line BP: (57-153)/(42-89) 107/69 (07/25 0700) FiO2 (%):  [40 %-100 %] 40 % (07/25 0744) Last BM Date:  (PTA)  Intake/Output from previous day: 07/24 0701 - 07/25 0700 In: 3218.2 [I.V.:2506.1; IV Piggyback:712.1] Out: 2157 [Urine:1305; Blood:400; Chest Tube:390] Intake/Output this shift: No intake/output data recorded.  PE: General: WD, ill appearing female who is laying in bed on the vent Heart: initially bigeminy and then irregularly irregular.  Palpable radial and pedal pulses bilaterally Lungs: vent, chest tubes in place Abd: soft, NT, ND, incisions c/d/i    Lab Results:  Recent Labs    05/05/21 1938 05/05/21 1939 05/06/21 0113 05/06/21 0401  WBC 9.6  --   --  13.4*  HGB 11.2*   < > 10.9* 11.2*  HCT 32.8*   < > 32.0* 33.6*  PLT 98*  --   --  112*   < > = values in this interval not displayed.   BMET Recent Labs    05/06/21 0119 05/06/21 0401  NA 135 134*  K 4.7 4.4  CL 99 99  CO2 23 23  GLUCOSE 187* 257*  BUN 26* 29*  CREATININE 1.87* 1.90*  CALCIUM 7.9* 7.8*   PT/INR Recent Labs    05/05/21 0001 05/05/21 0803  LABPROT 21.5* 21.6*  INR 1.9* 1.9*   CMP     Component Value Date/Time   NA 134 (L) 05/06/2021 0401   NA 141 08/14/2020 1415   NA 142 08/15/2013 1409   K 4.4 05/06/2021 0401   K 3.9 08/15/2013 1409   CL 99 05/06/2021 0401   CL 105 04/04/2013 1611   CO2 23 05/06/2021 0401   CO2 24 08/15/2013 1409   GLUCOSE 257 (H) 05/06/2021 0401   GLUCOSE 123 08/15/2013 1409   GLUCOSE 162 (H) 04/04/2013 1611   BUN 29 (H) 05/06/2021 0401   BUN 10  08/14/2020 1415   BUN 12.2 08/15/2013 1409   CREATININE 1.90 (H) 05/06/2021 0401   CREATININE 0.91 12/08/2014 1412   CREATININE 0.9 08/15/2013 1409   CALCIUM 7.8 (L) 05/06/2021 0401   CALCIUM 9.8 08/15/2013 1409   PROT 5.2 (L) 05/04/2021 1632   PROT 7.4 08/15/2013 1409   ALBUMIN 2.1 (L) 05/05/2021 1247   ALBUMIN 3.4 (L) 08/15/2013 1409   AST 138 (H) 05/04/2021 1632   AST 22 08/15/2013 1409   ALT 97 (H) 05/04/2021 1632   ALT 11 08/15/2013 1409   ALKPHOS 49 05/04/2021 1632   ALKPHOS 50 08/15/2013 1409   BILITOT 0.8 05/04/2021 1632   BILITOT 0.23 08/15/2013 1409   GFRNONAA 27 (L) 05/06/2021 0401   GFRAA 72 08/14/2020 1415   Lipase     Component Value Date/Time   LIPASE 34 01/21/2021 1115       Studies/Results: CT ABDOMEN PELVIS WO CONTRAST  Result Date: 05/04/2021 CLINICAL DATA:  Pneumothorax, abdominal distension per order. Per Dr. Robby Sermon request: the nurse pushed 35m contrast just before scanning through NG tube to rule out pooling in the chest. Cholecystectomy performed on 05/02/2021. EXAM: CT CHEST, ABDOMEN AND PELVIS WITHOUT CONTRAST TECHNIQUE: Multidetector CT imaging of  the chest, abdomen and pelvis was performed following the standard protocol without IV contrast. COMPARISON:  Current chest radiograph. Abdomen and pelvis CT, 07/28/2008. Right upper quadrant ultrasound, 01/21/2021. FINDINGS: CT CHEST FINDINGS Cardiovascular: Heart is normal in size. Three-vessel coronary artery calcifications. No pericardial effusion. Great vessels are top-normal in size. Mild aortic atherosclerotic calcifications. Mediastinum/Nodes: Endotracheal tube tip lies 2 cm above the carinal. No neck base, mediastinal or hilar masses. No enlarged lymph nodes. Trachea is unremarkable. There is contrast within the esophagus. Nasogastric tube tip terminates in the distal esophagus. Lungs/Pleura: Right-sided chest tube has its tip in the posterior mid right hemithorax. Right-sided hydropneumothorax.  Pneumothorax is approximately 40%. Small 2 moderate amount of pleural fluid with intermixed bubbles of air. Fluid appears partly loculated. Trace amount of left pleural fluid. No left pneumothorax. There is opacity in the posterior aspect of the right upper lobe and middle lobe with collapse/consolidation of the right lower lobe. Minimal dependent atelectasis is noted on the left. Left lung otherwise clear. Musculoskeletal: No fracture or acute finding.  No bone lesion. CT ABDOMEN PELVIS FINDINGS Hepatobiliary: No focal liver abnormality is seen. Status post cholecystectomy. No biliary dilatation. Pancreas: Unremarkable. No pancreatic ductal dilatation or surrounding inflammatory changes. Spleen: Relatively small spleen.  No mass. Adrenals/Urinary Tract: No adrenal masses. Bilateral renal masses. 6 cm hypoattenuating mass arises from the lower pole of the right kidney. Somewhat more complex appearing mass, also hypoattenuating, arises from the mid to upper pole the left kidney, 5.9 cm in size. Smaller round mass arises from the anterior lower pole the left kidney, 2 cm in size. These are all consistent with cysts. No other renal masses, no stones and no hydronephrosis. Ureters are normal in course and in caliber. Bladder is mostly decompressed with a Foley catheter. Stomach/Bowel: Stomach is unremarkable. Small bowel and colon are normal in caliber. No wall thickening. No inflammation. Normal appendix visualized. Vascular/Lymphatic: Aortic atherosclerosis. No enlarged abdominal or pelvic lymph nodes. Reproductive: Unremarkable. Other: Small bubbles of air are noted in the anterior abdominal wall subcutaneous fat consistent with postoperative air. There is hazy inflammatory type change in the upper abdomen centered on the porta hepatis, also consistent with expected postoperative change. No ascites. No abdominal wall hernia. Musculoskeletal: No fracture or acute finding.  No bone lesion. IMPRESSION: 1. Right  hydropneumothorax. Pneumothorax is approximately 40%. There is partly loculated pleural fluid and contains bubbles of air. A right-sided chest tube has its tip lying in the posterior right mid hemithorax. 2. Consolidation/atelectasis in the dependent right lung, involving the entire right lower lobe. A component of infection should be considered in the proper clinical setting. 3. Nasogastric tube tip lies in the distal esophagus. 4. Hazy opacity noted in the upper abdomen centered on the porta hepatis consistent with the expected postoperative change following the recent cholecystectomy. There is no abscess or evidence of an operative complication. 5. Coronary artery and aortic atherosclerosis. 6. Well-positioned endotracheal tube. Electronically Signed   By: Lajean Manes M.D.   On: 05/04/2021 18:35   CT HEAD WO CONTRAST  Result Date: 05/04/2021 CLINICAL DATA:  80 year old female with altered mental status. EXAM: CT HEAD WITHOUT CONTRAST TECHNIQUE: Contiguous axial images were obtained from the base of the skull through the vertex without intravenous contrast. COMPARISON:  None. FINDINGS: Brain: Mild age-related atrophy and moderate chronic microvascular ischemic changes. There is no acute intracranial hemorrhage. No mass effect or midline shift. No extra-axial fluid collection. Vascular: No hyperdense vessel or unexpected calcification. Skull: Normal. Negative  for fracture or focal lesion. Sinuses/Orbits: No acute finding. Other: An endotracheal and enteric tube are partially visualized. IMPRESSION: 1. No acute intracranial pathology. 2. Age-related atrophy and chronic microvascular ischemic changes. Electronically Signed   By: Anner Crete M.D.   On: 05/04/2021 18:19   CT Chest Wo Contrast  Result Date: 05/04/2021 CLINICAL DATA:  Pneumothorax, abdominal distension per order. Per Dr. Robby Sermon request: the nurse pushed 25m contrast just before scanning through NG tube to rule out pooling in the  chest. Cholecystectomy performed on 05/02/2021. EXAM: CT CHEST, ABDOMEN AND PELVIS WITHOUT CONTRAST TECHNIQUE: Multidetector CT imaging of the chest, abdomen and pelvis was performed following the standard protocol without IV contrast. COMPARISON:  Current chest radiograph. Abdomen and pelvis CT, 07/28/2008. Right upper quadrant ultrasound, 01/21/2021. FINDINGS: CT CHEST FINDINGS Cardiovascular: Heart is normal in size. Three-vessel coronary artery calcifications. No pericardial effusion. Great vessels are top-normal in size. Mild aortic atherosclerotic calcifications. Mediastinum/Nodes: Endotracheal tube tip lies 2 cm above the carinal. No neck base, mediastinal or hilar masses. No enlarged lymph nodes. Trachea is unremarkable. There is contrast within the esophagus. Nasogastric tube tip terminates in the distal esophagus. Lungs/Pleura: Right-sided chest tube has its tip in the posterior mid right hemithorax. Right-sided hydropneumothorax. Pneumothorax is approximately 40%. Small 2 moderate amount of pleural fluid with intermixed bubbles of air. Fluid appears partly loculated. Trace amount of left pleural fluid. No left pneumothorax. There is opacity in the posterior aspect of the right upper lobe and middle lobe with collapse/consolidation of the right lower lobe. Minimal dependent atelectasis is noted on the left. Left lung otherwise clear. Musculoskeletal: No fracture or acute finding.  No bone lesion. CT ABDOMEN PELVIS FINDINGS Hepatobiliary: No focal liver abnormality is seen. Status post cholecystectomy. No biliary dilatation. Pancreas: Unremarkable. No pancreatic ductal dilatation or surrounding inflammatory changes. Spleen: Relatively small spleen.  No mass. Adrenals/Urinary Tract: No adrenal masses. Bilateral renal masses. 6 cm hypoattenuating mass arises from the lower pole of the right kidney. Somewhat more complex appearing mass, also hypoattenuating, arises from the mid to upper pole the left kidney,  5.9 cm in size. Smaller round mass arises from the anterior lower pole the left kidney, 2 cm in size. These are all consistent with cysts. No other renal masses, no stones and no hydronephrosis. Ureters are normal in course and in caliber. Bladder is mostly decompressed with a Foley catheter. Stomach/Bowel: Stomach is unremarkable. Small bowel and colon are normal in caliber. No wall thickening. No inflammation. Normal appendix visualized. Vascular/Lymphatic: Aortic atherosclerosis. No enlarged abdominal or pelvic lymph nodes. Reproductive: Unremarkable. Other: Small bubbles of air are noted in the anterior abdominal wall subcutaneous fat consistent with postoperative air. There is hazy inflammatory type change in the upper abdomen centered on the porta hepatis, also consistent with expected postoperative change. No ascites. No abdominal wall hernia. Musculoskeletal: No fracture or acute finding.  No bone lesion. IMPRESSION: 1. Right hydropneumothorax. Pneumothorax is approximately 40%. There is partly loculated pleural fluid and contains bubbles of air. A right-sided chest tube has its tip lying in the posterior right mid hemithorax. 2. Consolidation/atelectasis in the dependent right lung, involving the entire right lower lobe. A component of infection should be considered in the proper clinical setting. 3. Nasogastric tube tip lies in the distal esophagus. 4. Hazy opacity noted in the upper abdomen centered on the porta hepatis consistent with the expected postoperative change following the recent cholecystectomy. There is no abscess or evidence of an operative complication. 5. Coronary artery  and aortic atherosclerosis. 6. Well-positioned endotracheal tube. Electronically Signed   By: Lajean Manes M.D.   On: 05/04/2021 18:35   DG CHEST PORT 1 VIEW  Result Date: 05/06/2021 CLINICAL DATA:  ET and chest tubes status post thoracotomy Esophageal perforation EXAM: PORTABLE CHEST 1 VIEW COMPARISON:  05/05/2021  FINDINGS: Heart size within normal limits. No pulmonary vascular congestion. Dilated thoracic aorta again noted. Nasogastric tube extends just below the left hemidiaphragm. Multiple right chest tubes again noted. No significant residual pneumothorax is identified. There is mild bibasilar atelectasis. Temporary right hemodialysis catheter unchanged in position. Endotracheal tube tip located approximately 5 cm above the carina. IMPRESSION: 1. Lines and tubes as above. 2. No pneumothorax. Electronically Signed   By: Miachel Roux M.D.   On: 05/06/2021 07:57   DG Chest Port 1 View  Result Date: 05/05/2021 CLINICAL DATA:  Respiratory distress EXAM: PORTABLE CHEST 1 VIEW COMPARISON:  None. FINDINGS: Support Apparatus: --Endotracheal tube: Tip at the level of the clavicular heads. --Enteric tube:Sideport projects over the distal esophagus. Recommend advancing by 7 cm. --Vascular catheter(s):Right internal jugular vein approach central venous catheter tip is at the cavoatrial junction. --Other: None Right basilar opacities. Lateral right-sided rib fractures. Cardiomediastinal contours are normal. IMPRESSION: 1. Endotracheal tube tip at the level of the clavicular heads. 2. Orogastric tube sideport projects over the distal esophagus. Recommend advancing by 7 cm. 3. Right basilar opacities, likely atelectasis and pleural effusion. Electronically Signed   By: Ulyses Jarred M.D.   On: 05/05/2021 21:51   DG Chest Port 1 View  Result Date: 05/05/2021 CLINICAL DATA:  Central line placement, altered mental status EXAM: PORTABLE CHEST 1 VIEW COMPARISON:  Radiograph 05/04/2021 FINDINGS: Right pneumothorax is similar to slightly increased from comparison prior radiography despite stable positioning of a right pleural catheter directed apically. Accounting for differences in positioning including a steep left anterior obliquity. Dual lumen right IJ approach central venous catheter tip terminates at the level of the right atrium.  Endotracheal tube tip terminates 3.6 cm from the carina. Transesophageal tube tip and side port terminate beyond the GE junction, below the margins of imaging. Telemetry leads and external support devices overlie the chest. Stable cardiomegaly, cardiomediastinal contours and evidence of prior coronary stenting. Some persistent hazy opacity present in left lung base, favor atelectatic. Additional atelectatic changes and volume loss in the partially collapsed right. IMPRESSION: Right pneumothorax is similar to slightly increased in size accounting for differences in patient positioning. This is despite the continued presence of a right pleural catheter. Additional lines and tubes as above. Bibasilar hazy opacities, favoring atelectatic change with additional volume loss in the partially collapsed right lung. Stable cardiomegaly, coronary and aortic atherosclerosis. Prior coronary stenting. Electronically Signed   By: Lovena Le M.D.   On: 05/05/2021 02:23   DG Chest Port 1 View  Result Date: 05/04/2021 CLINICAL DATA:  Pneumothorax EXAM: PORTABLE CHEST 1 VIEW COMPARISON:  05/04/2021 at 4:17 p.m. FINDINGS: Unchanged appearance of endotracheal tube and esophageal catheter. The right pneumothorax is decreased in size following placement a right chest wall catheter. Left lung is clear. IMPRESSION: Decrease in size of right pneumothorax following chest wall catheter placement. Electronically Signed   By: Ulyses Jarred M.D.   On: 05/04/2021 22:33   DG Chest Port 1 View  Result Date: 05/04/2021 CLINICAL DATA:  sob EXAM: PORTABLE CHEST 1 VIEW COMPARISON:  March 29, 2021 FINDINGS: Evaluation is limited secondary to mode tubal overlying lines. The cardiomediastinal silhouette is enlarged in contour.Atherosclerotic calcifications  of the aorta. Large RIGHT pleural effusion. ETT tip terminates over the RIGHT mainstem bronchus. The enteric tube courses through the chest to the abdomen beyond the field-of-view. No  pneumothorax. Bibasilar homogeneous opacities, likely atelectasis. Visualized abdomen is unremarkable. Multilevel degenerative changes of the thoracic spine. IMPRESSION: 1.  ETT tip terminates over the RIGHT mainstem bronchus. 2.  Large loculated appearing RIGHT-sided pleural effusion. These results were called by telephone at the time of interpretation on 05/04/2021 at 4:29 pm to provider [facility] , who verbally acknowledged these results. Electronically Signed   By: Valentino Saxon MD   On: 05/04/2021 16:31   DG ESOPHAGUS W SINGLE CM (SOL OR THIN BA)  Result Date: 05/05/2021 CLINICAL DATA:  Pneumothorax and clinical concern for esophageal leak. EXAM: ESOPHOGRAM/BARIUM SWALLOW TECHNIQUE: Single contrast examination was performed using water-soluble contrast material. FLUOROSCOPY TIME:  Fluoroscopy Time:  3 minutes and 6 seconds. Radiation Exposure Index (if provided by the fluoroscopic device): 106 mGy Number of Acquired Spot Images: COMPARISON:  Chest abdomen pelvis CT 05/04/2021 FINDINGS: Initial fluoroscopy of the mediastinum shows no residual contrast material from CT scan yesterday. Patient had an existing OG tube in situ. OG tube was pulled back so that the distal tip was in the region of the esophagogastric junction, and the proximal side port in the distal third of the esophagus. Water-soluble contrast was then introduced via the OG tube into the esophageal lumen. With initial injection, a thin right paraesophageal line of extraluminal contrast material appears, approximately 4 cm below the level of the carina. Approximately 50 cc of contrast was introduced via the OG tube. With continued observation, no continued flow of extraluminal contrast was observed and no frank migration of contrast into the right pleural space could be identified. Subsequently, the patient was turned LPO which revealed a more prominent finger-like projection of extraluminal contrast posterior to the esophagus and  tracking to the right. Continued observation showed no free flow of this contrast material into the right pleural space. The OG tube was then flushed with water and repositioned so that the tip was in the mid stomach, placing the proximal port well below the EG junction. The patient's nurse then taped the OG tube back in place. IMPRESSION: Contained finger-like projection of extraluminal contrast arising from the posterior/right posterolateral esophagus approximately 1-2 cm distal to the carina. This extraluminal collection tracks caudally for a total distance of 4-5 cm, but no frank migration of contrast into the right pleural space is identified on this study. Critical Value/emergent results were called by telephone at the time of interpretation on 05/05/2021 at approximately 12:15 pm to provider [facility] , who verbally acknowledged these results. Electronically Signed   By: Misty Stanley M.D.   On: 05/05/2021 12:20    Anti-infectives: Anti-infectives (From admission, onward)    Start     Dose/Rate Route Frequency Ordered Stop   05/06/21 1245  anidulafungin (ERAXIS) 100 mg in sodium chloride 0.9 % 100 mL IVPB       See Hyperspace for full Linked Orders Report.   100 mg 78 mL/hr over 100 Minutes Intravenous Every 24 hours 05/05/21 1145     05/06/21 0600  piperacillin-tazobactam (ZOSYN) IVPB 2.25 g        2.25 g 100 mL/hr over 30 Minutes Intravenous Every 8 hours 05/05/21 2231     05/05/21 1245  anidulafungin (ERAXIS) 200 mg in sodium chloride 0.9 % 200 mL IVPB       See Hyperspace for full Linked  Orders Report.   200 mg 78 mL/hr over 200 Minutes Intravenous  Once 05/05/21 1145 05/05/21 2152   05/05/21 1000  piperacillin-tazobactam (ZOSYN) IVPB 3.375 g  Status:  Discontinued        3.375 g 12.5 mL/hr over 240 Minutes Intravenous Every 12 hours 05/04/21 2021 05/04/21 2334   05/05/21 0400  piperacillin-tazobactam (ZOSYN) IVPB 3.375 g  Status:  Discontinued        3.375 g 100 mL/hr over 30  Minutes Intravenous Every 6 hours 05/04/21 2335 05/05/21 2231   05/05/21 0100  doxycycline (VIBRAMYCIN) 100 mg in sodium chloride 0.9 % 250 mL IVPB  Status:  Discontinued        100 mg 125 mL/hr over 120 Minutes Intravenous Every 12 hours 05/05/21 0003 05/05/21 1158   05/04/21 2115  piperacillin-tazobactam (ZOSYN) IVPB 3.375 g        3.375 g 100 mL/hr over 30 Minutes Intravenous STAT 05/04/21 2016 05/04/21 2153        Assessment/Plan Chronic calculous cholecystitis  Fitz-Hugh Curtis adhesions  S/P laparoscopic cholecystectomy and LOA 05/02/21 Dr. Redmond Pulling - abdominal incisions c/d/I, abdomen is soft  Esophageal perforation with right empyema S/P R thoracotomy, repair of esophageal perforation with muscle flap, drainage of empyema and decortication of R lung 05/05/21 Dr. Cyndia Bent  - per TCTS, chest tubes in place and NPO   FEN: NPO, IVF, NGT to LIWS VTE: SCDs ID: Zosyn/Eraxis 7/24>> Foley: present   - per CCM -  Septic shock - on levo  VDRF - per CCM Acute renal failure - CRRT per nephrology  HTN HLD T2DM - SSI   LOS: 2 days    Norm Parcel, [facility] Surgery 05/06/2021, 8:08 AM Please see Amion for pager number during day hours 7:00am-4:30pm

## 2021-05-06 NOTE — Progress Notes (Addendum)
Pt has been having bradycardic episodes tonight but rebounding well. Rates are now dropping as low as 25bmp and MAPs in the 50s. EKG obtained. Pt was started on dopamine per Dr Valda Lamb orders. Pt BP is now stabilizing with MAPs above 65, and heart rate above 70.

## 2021-05-06 NOTE — Progress Notes (Signed)
eLink Physician-Brief Progress Note Patient Name: Dana Coleman DOB: 1941/05/19 MRN: [id number]   Date of Service  05/06/2021  HPI/Events of Note  Notified of episodes of bradycardia Repeat electrolytes without significant abnormality. Mg level not included Remains on norepinephrine  EKG RBBB, Qtc 453  eICU Interventions  Pacer pads placed in the event of worsening bradycardia Will add on magnesium level Avoid QT prolonging meds if possible     Intervention Category Major Interventions: Arrhythmia - evaluation and management  [name] 05/06/2021, 2:21 AM

## 2021-05-06 NOTE — Progress Notes (Signed)
Inpatient Diabetes Program Recommendations  AACE/ADA: New Consensus Statement on Inpatient Glycemic Control   Target Ranges:  Prepandial:   less than 140 mg/dL      Peak postprandial:   less than 180 mg/dL (1-2 hours)      Critically ill patients:  140 - 180 mg/dL  Results for SHEILA, SANDROCK (MRN [id number]) as of 05/06/2021 12:30  Ref. Range 05/05/2021 08:09 05/05/2021 14:03 05/05/2021 19:37 05/05/2021 23:33 05/06/2021 04:05 05/06/2021 07:50 05/06/2021 11:29  Glucose-Capillary Latest Ref Range: 70 - 99 mg/dL 71 70 99 125 (H) 239 (H) 280 (H) 207 (H)   Review of Glycemic Control  Diabetes history: DM2 Outpatient Diabetes medications: Lantus 24 units daily, Jardiance 10 mg daily, Metformin 500 mg BID Current orders for Inpatient glycemic control: Novolog 0-24 units Q4H  Inpatient Diabetes Program Recommendations:    Insulin: May want to consider ordering low dose basal insulin.  Thanks, Barnie Alderman, RN, MSN, CDE Diabetes Coordinator Inpatient Diabetes Program [phone] (Team Pager from 8am to 5pm)

## 2021-05-06 NOTE — Progress Notes (Signed)
[facility] Progress Note   S/p lap choly and LOA 05/02/21, esophageal perf w/ rt empyema s/p rt thoracotomy + repair, decortication of rt lung on 05/05/21 with AKI from septic shock.  Assessment/ Plan:   AKI - severe due to septic shock and hypoperfusion. Normal renal function on 04/26/21 w/ creat 0.88.  Creat 4.40 here on admit, suspected ATN due to septic shock/ hypoperfusion.  Started on CRRT 7/24 0400-1100 due to severe acidemia.    - UOP appears to be picking up. Will plan on holding CRRT and evaluating in the AM    Volume- mild vol excess on exam SP cholecystectomy - on 7/21 R loculated effusion - sp chest tube, possible empyema, on IV abx Septic shock - on pressor support with dopamine and levophed Metabolic acidosis - improving; HCO3 23 this AM. IDDM - per CCM  Subjective:   seen in ICU. AFIB w/ bradycardia overnight into 20-40's on dopamine currently in 50's, NSTEMI infr vs leak.   Objective:   BP (!) 109/57   Pulse (!) 51   Temp 99.14 F (37.3 C)   Resp 16   Ht '5\' 1"'  (1.549 m)   Wt 58 kg   SpO2 97%   BMI 24.16 kg/m   Intake/Output Summary (Last 24 hours) at 05/06/2021 2703 Last data filed at 05/06/2021 0800 Gross per 24 hour  Intake 3309.1 ml  Output 2173 ml  Net 1136.1 ml   Weight change:   Physical Exam: Gen on vent, sedated No jvd or bruits Chest clear anterior/ lateral, R chest pigtail chest tube RRR no MRG Abd soft ntnd no mass or ascites +bs GU foley cath draining clear amber urine Ext tr LE or UE edema Neuro is on vent, sedated  Imaging: CT ABDOMEN PELVIS WO CONTRAST  Result Date: 05/04/2021 CLINICAL DATA:  Pneumothorax, abdominal distension per order. Per Dr. Robby Sermon request: the nurse pushed 44m contrast just before scanning through NG tube to rule out pooling in the chest. Cholecystectomy performed on 05/02/2021. EXAM: CT CHEST, ABDOMEN AND PELVIS WITHOUT CONTRAST TECHNIQUE: Multidetector CT imaging of the chest, abdomen  and pelvis was performed following the standard protocol without IV contrast. COMPARISON:  Current chest radiograph. Abdomen and pelvis CT, 07/28/2008. Right upper quadrant ultrasound, 01/21/2021. FINDINGS: CT CHEST FINDINGS Cardiovascular: Heart is normal in size. Three-vessel coronary artery calcifications. No pericardial effusion. Great vessels are top-normal in size. Mild aortic atherosclerotic calcifications. Mediastinum/Nodes: Endotracheal tube tip lies 2 cm above the carinal. No neck base, mediastinal or hilar masses. No enlarged lymph nodes. Trachea is unremarkable. There is contrast within the esophagus. Nasogastric tube tip terminates in the distal esophagus. Lungs/Pleura: Right-sided chest tube has its tip in the posterior mid right hemithorax. Right-sided hydropneumothorax. Pneumothorax is approximately 40%. Small 2 moderate amount of pleural fluid with intermixed bubbles of air. Fluid appears partly loculated. Trace amount of left pleural fluid. No left pneumothorax. There is opacity in the posterior aspect of the right upper lobe and middle lobe with collapse/consolidation of the right lower lobe. Minimal dependent atelectasis is noted on the left. Left lung otherwise clear. Musculoskeletal: No fracture or acute finding.  No bone lesion. CT ABDOMEN PELVIS FINDINGS Hepatobiliary: No focal liver abnormality is seen. Status post cholecystectomy. No biliary dilatation. Pancreas: Unremarkable. No pancreatic ductal dilatation or surrounding inflammatory changes. Spleen: Relatively small spleen.  No mass. Adrenals/Urinary Tract: No adrenal masses. Bilateral renal masses. 6 cm hypoattenuating mass arises from the lower pole of the right kidney. Somewhat more complex appearing  mass, also hypoattenuating, arises from the mid to upper pole the left kidney, 5.9 cm in size. Smaller round mass arises from the anterior lower pole the left kidney, 2 cm in size. These are all consistent with cysts. No other renal  masses, no stones and no hydronephrosis. Ureters are normal in course and in caliber. Bladder is mostly decompressed with a Foley catheter. Stomach/Bowel: Stomach is unremarkable. Small bowel and colon are normal in caliber. No wall thickening. No inflammation. Normal appendix visualized. Vascular/Lymphatic: Aortic atherosclerosis. No enlarged abdominal or pelvic lymph nodes. Reproductive: Unremarkable. Other: Small bubbles of air are noted in the anterior abdominal wall subcutaneous fat consistent with postoperative air. There is hazy inflammatory type change in the upper abdomen centered on the porta hepatis, also consistent with expected postoperative change. No ascites. No abdominal wall hernia. Musculoskeletal: No fracture or acute finding.  No bone lesion. IMPRESSION: 1. Right hydropneumothorax. Pneumothorax is approximately 40%. There is partly loculated pleural fluid and contains bubbles of air. A right-sided chest tube has its tip lying in the posterior right mid hemithorax. 2. Consolidation/atelectasis in the dependent right lung, involving the entire right lower lobe. A component of infection should be considered in the proper clinical setting. 3. Nasogastric tube tip lies in the distal esophagus. 4. Hazy opacity noted in the upper abdomen centered on the porta hepatis consistent with the expected postoperative change following the recent cholecystectomy. There is no abscess or evidence of an operative complication. 5. Coronary artery and aortic atherosclerosis. 6. Well-positioned endotracheal tube. Electronically Signed   By: Lajean Manes M.D.   On: 05/04/2021 18:35   CT HEAD WO CONTRAST  Result Date: 05/04/2021 CLINICAL DATA:  80 year old female with altered mental status. EXAM: CT HEAD WITHOUT CONTRAST TECHNIQUE: Contiguous axial images were obtained from the base of the skull through the vertex without intravenous contrast. COMPARISON:  None. FINDINGS: Brain: Mild age-related atrophy and moderate  chronic microvascular ischemic changes. There is no acute intracranial hemorrhage. No mass effect or midline shift. No extra-axial fluid collection. Vascular: No hyperdense vessel or unexpected calcification. Skull: Normal. Negative for fracture or focal lesion. Sinuses/Orbits: No acute finding. Other: An endotracheal and enteric tube are partially visualized. IMPRESSION: 1. No acute intracranial pathology. 2. Age-related atrophy and chronic microvascular ischemic changes. Electronically Signed   By: Anner Crete M.D.   On: 05/04/2021 18:19   CT Chest Wo Contrast  Result Date: 05/04/2021 CLINICAL DATA:  Pneumothorax, abdominal distension per order. Per Dr. Robby Sermon request: the nurse pushed 41m contrast just before scanning through NG tube to rule out pooling in the chest. Cholecystectomy performed on 05/02/2021. EXAM: CT CHEST, ABDOMEN AND PELVIS WITHOUT CONTRAST TECHNIQUE: Multidetector CT imaging of the chest, abdomen and pelvis was performed following the standard protocol without IV contrast. COMPARISON:  Current chest radiograph. Abdomen and pelvis CT, 07/28/2008. Right upper quadrant ultrasound, 01/21/2021. FINDINGS: CT CHEST FINDINGS Cardiovascular: Heart is normal in size. Three-vessel coronary artery calcifications. No pericardial effusion. Great vessels are top-normal in size. Mild aortic atherosclerotic calcifications. Mediastinum/Nodes: Endotracheal tube tip lies 2 cm above the carinal. No neck base, mediastinal or hilar masses. No enlarged lymph nodes. Trachea is unremarkable. There is contrast within the esophagus. Nasogastric tube tip terminates in the distal esophagus. Lungs/Pleura: Right-sided chest tube has its tip in the posterior mid right hemithorax. Right-sided hydropneumothorax. Pneumothorax is approximately 40%. Small 2 moderate amount of pleural fluid with intermixed bubbles of air. Fluid appears partly loculated. Trace amount of left pleural fluid. No left pneumothorax.  There is opacity in the posterior aspect of the right upper lobe and middle lobe with collapse/consolidation of the right lower lobe. Minimal dependent atelectasis is noted on the left. Left lung otherwise clear. Musculoskeletal: No fracture or acute finding.  No bone lesion. CT ABDOMEN PELVIS FINDINGS Hepatobiliary: No focal liver abnormality is seen. Status post cholecystectomy. No biliary dilatation. Pancreas: Unremarkable. No pancreatic ductal dilatation or surrounding inflammatory changes. Spleen: Relatively small spleen.  No mass. Adrenals/Urinary Tract: No adrenal masses. Bilateral renal masses. 6 cm hypoattenuating mass arises from the lower pole of the right kidney. Somewhat more complex appearing mass, also hypoattenuating, arises from the mid to upper pole the left kidney, 5.9 cm in size. Smaller round mass arises from the anterior lower pole the left kidney, 2 cm in size. These are all consistent with cysts. No other renal masses, no stones and no hydronephrosis. Ureters are normal in course and in caliber. Bladder is mostly decompressed with a Foley catheter. Stomach/Bowel: Stomach is unremarkable. Small bowel and colon are normal in caliber. No wall thickening. No inflammation. Normal appendix visualized. Vascular/Lymphatic: Aortic atherosclerosis. No enlarged abdominal or pelvic lymph nodes. Reproductive: Unremarkable. Other: Small bubbles of air are noted in the anterior abdominal wall subcutaneous fat consistent with postoperative air. There is hazy inflammatory type change in the upper abdomen centered on the porta hepatis, also consistent with expected postoperative change. No ascites. No abdominal wall hernia. Musculoskeletal: No fracture or acute finding.  No bone lesion. IMPRESSION: 1. Right hydropneumothorax. Pneumothorax is approximately 40%. There is partly loculated pleural fluid and contains bubbles of air. A right-sided chest tube has its tip lying in the posterior right mid hemithorax.  2. Consolidation/atelectasis in the dependent right lung, involving the entire right lower lobe. A component of infection should be considered in the proper clinical setting. 3. Nasogastric tube tip lies in the distal esophagus. 4. Hazy opacity noted in the upper abdomen centered on the porta hepatis consistent with the expected postoperative change following the recent cholecystectomy. There is no abscess or evidence of an operative complication. 5. Coronary artery and aortic atherosclerosis. 6. Well-positioned endotracheal tube. Electronically Signed   By: Lajean Manes M.D.   On: 05/04/2021 18:35   DG CHEST PORT 1 VIEW  Result Date: 05/06/2021 CLINICAL DATA:  ET and chest tubes status post thoracotomy Esophageal perforation EXAM: PORTABLE CHEST 1 VIEW COMPARISON:  05/05/2021 FINDINGS: Heart size within normal limits. No pulmonary vascular congestion. Dilated thoracic aorta again noted. Nasogastric tube extends just below the left hemidiaphragm. Multiple right chest tubes again noted. No significant residual pneumothorax is identified. There is mild bibasilar atelectasis. Temporary right hemodialysis catheter unchanged in position. Endotracheal tube tip located approximately 5 cm above the carina. IMPRESSION: 1. Lines and tubes as above. 2. No pneumothorax. Electronically Signed   By: Miachel Roux M.D.   On: 05/06/2021 07:57   DG Chest Port 1 View  Result Date: 05/05/2021 CLINICAL DATA:  Respiratory distress EXAM: PORTABLE CHEST 1 VIEW COMPARISON:  None. FINDINGS: Support Apparatus: --Endotracheal tube: Tip at the level of the clavicular heads. --Enteric tube:Sideport projects over the distal esophagus. Recommend advancing by 7 cm. --Vascular catheter(s):Right internal jugular vein approach central venous catheter tip is at the cavoatrial junction. --Other: None Right basilar opacities. Lateral right-sided rib fractures. Cardiomediastinal contours are normal. IMPRESSION: 1. Endotracheal tube tip at the  level of the clavicular heads. 2. Orogastric tube sideport projects over the distal esophagus. Recommend advancing by 7 cm. 3. Right basilar opacities, likely  atelectasis and pleural effusion. Electronically Signed   By: Ulyses Jarred M.D.   On: 05/05/2021 21:51   DG Chest Port 1 View  Result Date: 05/05/2021 CLINICAL DATA:  Central line placement, altered mental status EXAM: PORTABLE CHEST 1 VIEW COMPARISON:  Radiograph 05/04/2021 FINDINGS: Right pneumothorax is similar to slightly increased from comparison prior radiography despite stable positioning of a right pleural catheter directed apically. Accounting for differences in positioning including a steep left anterior obliquity. Dual lumen right IJ approach central venous catheter tip terminates at the level of the right atrium. Endotracheal tube tip terminates 3.6 cm from the carina. Transesophageal tube tip and side port terminate beyond the GE junction, below the margins of imaging. Telemetry leads and external support devices overlie the chest. Stable cardiomegaly, cardiomediastinal contours and evidence of prior coronary stenting. Some persistent hazy opacity present in left lung base, favor atelectatic. Additional atelectatic changes and volume loss in the partially collapsed right. IMPRESSION: Right pneumothorax is similar to slightly increased in size accounting for differences in patient positioning. This is despite the continued presence of a right pleural catheter. Additional lines and tubes as above. Bibasilar hazy opacities, favoring atelectatic change with additional volume loss in the partially collapsed right lung. Stable cardiomegaly, coronary and aortic atherosclerosis. Prior coronary stenting. Electronically Signed   By: Lovena Le M.D.   On: 05/05/2021 02:23   DG Chest Port 1 View  Result Date: 05/04/2021 CLINICAL DATA:  Pneumothorax EXAM: PORTABLE CHEST 1 VIEW COMPARISON:  05/04/2021 at 4:17 p.m. FINDINGS: Unchanged appearance of  endotracheal tube and esophageal catheter. The right pneumothorax is decreased in size following placement a right chest wall catheter. Left lung is clear. IMPRESSION: Decrease in size of right pneumothorax following chest wall catheter placement. Electronically Signed   By: Ulyses Jarred M.D.   On: 05/04/2021 22:33   DG Chest Port 1 View  Result Date: 05/04/2021 CLINICAL DATA:  sob EXAM: PORTABLE CHEST 1 VIEW COMPARISON:  March 29, 2021 FINDINGS: Evaluation is limited secondary to mode tubal overlying lines. The cardiomediastinal silhouette is enlarged in contour.Atherosclerotic calcifications of the aorta. Large RIGHT pleural effusion. ETT tip terminates over the RIGHT mainstem bronchus. The enteric tube courses through the chest to the abdomen beyond the field-of-view. No pneumothorax. Bibasilar homogeneous opacities, likely atelectasis. Visualized abdomen is unremarkable. Multilevel degenerative changes of the thoracic spine. IMPRESSION: 1.  ETT tip terminates over the RIGHT mainstem bronchus. 2.  Large loculated appearing RIGHT-sided pleural effusion. These results were called by telephone at the time of interpretation on 05/04/2021 at 4:29 pm to provider [facility] , who verbally acknowledged these results. Electronically Signed   By: Valentino Saxon MD   On: 05/04/2021 16:31   DG ESOPHAGUS W SINGLE CM (SOL OR THIN BA)  Result Date: 05/05/2021 CLINICAL DATA:  Pneumothorax and clinical concern for esophageal leak. EXAM: ESOPHOGRAM/BARIUM SWALLOW TECHNIQUE: Single contrast examination was performed using water-soluble contrast material. FLUOROSCOPY TIME:  Fluoroscopy Time:  3 minutes and 6 seconds. Radiation Exposure Index (if provided by the fluoroscopic device): 106 mGy Number of Acquired Spot Images: COMPARISON:  Chest abdomen pelvis CT 05/04/2021 FINDINGS: Initial fluoroscopy of the mediastinum shows no residual contrast material from CT scan yesterday. Patient had an existing OG tube in situ.  OG tube was pulled back so that the distal tip was in the region of the esophagogastric junction, and the proximal side port in the distal third of the esophagus. Water-soluble contrast was then introduced via the OG tube into the  esophageal lumen. With initial injection, a thin right paraesophageal line of extraluminal contrast material appears, approximately 4 cm below the level of the carina. Approximately 50 cc of contrast was introduced via the OG tube. With continued observation, no continued flow of extraluminal contrast was observed and no frank migration of contrast into the right pleural space could be identified. Subsequently, the patient was turned LPO which revealed a more prominent finger-like projection of extraluminal contrast posterior to the esophagus and tracking to the right. Continued observation showed no free flow of this contrast material into the right pleural space. The OG tube was then flushed with water and repositioned so that the tip was in the mid stomach, placing the proximal port well below the EG junction. The patient's nurse then taped the OG tube back in place. IMPRESSION: Contained finger-like projection of extraluminal contrast arising from the posterior/right posterolateral esophagus approximately 1-2 cm distal to the carina. This extraluminal collection tracks caudally for a total distance of 4-5 cm, but no frank migration of contrast into the right pleural space is identified on this study. Critical Value/emergent results were called by telephone at the time of interpretation on 05/05/2021 at approximately 12:15 pm to provider [facility] , who verbally acknowledged these results. Electronically Signed   By: Misty Stanley M.D.   On: 05/05/2021 12:20    Labs: BMET Recent Labs  Lab 05/04/21 1632 05/04/21 1643 05/04/21 1644 05/04/21 1835 05/05/21 0001 05/05/21 1247 05/05/21 1540 05/05/21 1629 05/05/21 1634 05/05/21 1756 05/05/21 1938 05/05/21 1939 05/05/21 2126  05/06/21 0113 05/06/21 0119 05/06/21 0401  NA 140   < > 133*   < > 139 138   < > 136   < > 136 137 136 136 135 135 134*  K 4.4   < > 8.0*   < > 4.5 4.0   < > 4.3   < > 4.7 4.1 4.4 4.3 5.3* 4.7 4.4  CL 106  --  111  --  98 96*  --  97*  --   --  101  --   --   --  99 99  CO2 8*  --   --   --  17* 21*  --   --   --   --  24  --   --   --  23 23  GLUCOSE 118*  --  102*  --  151* 82  --  113*  --   --  101*  --   --   --  187* 257*  BUN 38*  --  60*  --  38* 31*  --  42*  --   --  29*  --   --   --  26* 29*  CREATININE 4.07*  --  4.40*  --  3.11* 2.20*  --  1.80*  --   --  1.85*  --   --   --  1.87* 1.90*  CALCIUM 10.0  --   --   --  8.7* 8.2*  --   --   --   --  8.4*  --   --   --  7.9* 7.8*  PHOS  --   --   --   --  5.5* 3.0  --   --   --   --   --   --   --   --   --   --    < > = values in this interval not displayed.  CBC Recent Labs  Lab 05/04/21 1632 05/04/21 1643 05/05/21 0001 05/05/21 0803 05/05/21 1540 05/05/21 1938 05/05/21 1939 05/05/21 2126 05/06/21 0113 05/06/21 0401  WBC 12.3*  --  7.1  --   --  9.6  --   --   --  13.4*  NEUTROABS 9.5*  --   --   --   --   --   --   --   --   --   HGB 10.8*   < > 11.7*  --    < > 11.2* 11.2* 11.6* 10.9* 11.2*  HCT 37.3   < > 36.3  --    < > 32.8* 33.0* 34.0* 32.0* 33.6*  MCV 104.2*  --  91.2  --   --  87.7  --   --   --  88.7  PLT 168  --  121* 133*  --  98*  --   --   --  112*   < > = values in this interval not displayed.    Medications:     atropine       chlorhexidine gluconate (MEDLINE KIT)  15 mL Mouth Rinse BID   Chlorhexidine Gluconate Cloth  6 each Topical Q0600   Chlorhexidine Gluconate Cloth  6 each Topical Daily   insulin aspart  0-24 Units Subcutaneous Q4H   mouth rinse  15 mL Mouth Rinse 10 times per day   pantoprazole (PROTONIX) IV  40 mg Intravenous QHS   sodium chloride flush  10-40 mL Intracatheter Q12H      Otelia Santee, MD 05/06/2021, 9:49 AM

## 2021-05-07 ENCOUNTER — Inpatient Hospital Stay: Payer: Self-pay

## 2021-05-07 ENCOUNTER — Inpatient Hospital Stay (HOSPITAL_COMMUNITY): Payer: Medicare Other

## 2021-05-07 DIAGNOSIS — J9601 Acute respiratory failure with hypoxia: Secondary | ICD-10-CM | POA: Diagnosis not present

## 2021-05-07 DIAGNOSIS — N179 Acute kidney failure, unspecified: Secondary | ICD-10-CM | POA: Diagnosis not present

## 2021-05-07 LAB — POCT I-STAT EG7
Acid-Base Excess: 3 mmol/L — ABNORMAL HIGH (ref 0.0–2.0)
Bicarbonate: 27.8 mmol/L (ref 20.0–28.0)
Calcium, Ion: 1.16 mmol/L (ref 1.15–1.40)
HCT: 32 % — ABNORMAL LOW (ref 36.0–46.0)
Hemoglobin: 10.9 g/dL — ABNORMAL LOW (ref 12.0–15.0)
O2 Saturation: 96 %
Patient temperature: 37.6
Potassium: 3.7 mmol/L (ref 3.5–5.1)
Sodium: 138 mmol/L (ref 135–145)
TCO2: 29 mmol/L (ref 22–32)
pCO2, Ven: 43.8 mmHg — ABNORMAL LOW (ref 44.0–60.0)
pH, Ven: 7.414 (ref 7.250–7.430)
pO2, Ven: 83 mmHg — ABNORMAL HIGH (ref 32.0–45.0)

## 2021-05-07 LAB — COMPREHENSIVE METABOLIC PANEL
ALT: 238 U/L — ABNORMAL HIGH (ref 0–44)
AST: 225 U/L — ABNORMAL HIGH (ref 15–41)
Albumin: 1.8 g/dL — ABNORMAL LOW (ref 3.5–5.0)
Alkaline Phosphatase: 93 U/L (ref 38–126)
Anion gap: 8 (ref 5–15)
BUN: 29 mg/dL — ABNORMAL HIGH (ref 8–23)
CO2: 26 mmol/L (ref 22–32)
Calcium: 7.9 mg/dL — ABNORMAL LOW (ref 8.9–10.3)
Chloride: 103 mmol/L (ref 98–111)
Creatinine, Ser: 1.6 mg/dL — ABNORMAL HIGH (ref 0.44–1.00)
GFR, Estimated: 33 mL/min — ABNORMAL LOW (ref >60)
Glucose, Bld: 131 mg/dL — ABNORMAL HIGH (ref 70–99)
Potassium: 3.7 mmol/L (ref 3.5–5.1)
Sodium: 137 mmol/L (ref 135–145)
Total Bilirubin: 1.1 mg/dL (ref 0.3–1.2)
Total Protein: 4.9 g/dL — ABNORMAL LOW (ref 6.5–8.1)

## 2021-05-07 LAB — GLUCOSE, CAPILLARY
Glucose-Capillary: 110 mg/dL — ABNORMAL HIGH (ref 70–99)
Glucose-Capillary: 123 mg/dL — ABNORMAL HIGH (ref 70–99)
Glucose-Capillary: 128 mg/dL — ABNORMAL HIGH (ref 70–99)
Glucose-Capillary: 135 mg/dL — ABNORMAL HIGH (ref 70–99)
Glucose-Capillary: 143 mg/dL — ABNORMAL HIGH (ref 70–99)
Glucose-Capillary: 164 mg/dL — ABNORMAL HIGH (ref 70–99)
Glucose-Capillary: 169 mg/dL — ABNORMAL HIGH (ref 70–99)
Glucose-Capillary: 97 mg/dL (ref 70–99)

## 2021-05-07 LAB — CBC
HCT: 29.8 % — ABNORMAL LOW (ref 36.0–46.0)
Hemoglobin: 10.1 g/dL — ABNORMAL LOW (ref 12.0–15.0)
MCH: 29.7 pg (ref 26.0–34.0)
MCHC: 33.9 g/dL (ref 30.0–36.0)
MCV: 87.6 fL (ref 80.0–100.0)
Platelets: 85 10*3/uL — ABNORMAL LOW (ref 150–400)
RBC: 3.4 MIL/uL — ABNORMAL LOW (ref 3.87–5.11)
RDW: 14.7 % (ref 11.5–15.5)
WBC: 15.3 10*3/uL — ABNORMAL HIGH (ref 4.0–10.5)
nRBC: 0.4 % — ABNORMAL HIGH (ref 0.0–0.2)

## 2021-05-07 LAB — MAGNESIUM: Magnesium: 2.7 mg/dL — ABNORMAL HIGH (ref 1.7–2.4)

## 2021-05-07 LAB — PHOSPHORUS: Phosphorus: 2.3 mg/dL — ABNORMAL LOW (ref 2.5–4.6)

## 2021-05-07 LAB — PATHOLOGIST SMEAR REVIEW

## 2021-05-07 MED ORDER — SODIUM CHLORIDE 0.9 % IV SOLN
INTRAVENOUS | Status: DC | PRN
  Administered 2021-05-07 – 2021-05-13 (×3): 250 mL via INTRAVENOUS
  Administered 2021-05-15 – 2021-05-19 (×2): 500 mL via INTRAVENOUS

## 2021-05-07 MED ORDER — ORAL CARE MOUTH RINSE
15.0000 mL | Freq: Two times a day (BID) | OROMUCOSAL | Status: DC
  Administered 2021-05-07 – 2021-05-09 (×5): 15 mL via OROMUCOSAL

## 2021-05-07 MED ORDER — PIPERACILLIN-TAZOBACTAM 3.375 G IVPB: 3.3750 g | Freq: Three times a day (TID) | INTRAVENOUS | Status: DC

## 2021-05-07 MED ORDER — DEXTROSE-NACL 5-0.9 % IV SOLN: INTRAVENOUS | Status: DC

## 2021-05-07 MED ORDER — SODIUM CHLORIDE 0.9% FLUSH
10.0000 mL | Freq: Two times a day (BID) | INTRAVENOUS | Status: DC
Start: 2021-05-07 — End: 2021-06-16
  Administered 2021-05-07: 10 mL
  Administered 2021-05-08: 20 mL
  Administered 2021-05-09 – 2021-05-13 (×9): 10 mL
  Administered 2021-05-13: 40 mL
  Administered 2021-05-14 – 2021-05-20 (×9): 10 mL
  Administered 2021-05-20: 20 mL
  Administered 2021-05-21 – 2021-05-22 (×2): 10 mL
  Administered 2021-05-22: 20 mL
  Administered 2021-05-23: 40 mL
  Administered 2021-05-23 – 2021-05-24 (×2): 20 mL
  Administered 2021-05-24 – 2021-05-28 (×9): 10 mL
  Administered 2021-05-29: 20 mL
  Administered 2021-05-29: 10 mL
  Administered 2021-05-30: 20 mL
  Administered 2021-05-30 – 2021-05-31 (×2): 10 mL
  Administered 2021-05-31: 20 mL
  Administered 2021-06-01: 30 mL
  Administered 2021-06-01: 10 mL
  Administered 2021-06-02: 20 mL
  Administered 2021-06-02 – 2021-06-05 (×5): 10 mL
  Administered 2021-06-06: 20 mL
  Administered 2021-06-06 – 2021-06-07 (×2): 10 mL
  Administered 2021-06-07: 20 mL
  Administered 2021-06-08 – 2021-06-09 (×4): 10 mL
  Administered 2021-06-10: 20 mL
  Administered 2021-06-10: 10 mL
  Administered 2021-06-11: 20 mL
  Administered 2021-06-12 – 2021-06-13 (×2): 3 mL
  Administered 2021-06-14 – 2021-06-15 (×2): 10 mL

## 2021-05-07 MED ORDER — TRAVASOL 10 % IV SOLN
INTRAVENOUS | Status: AC
  Filled 2021-05-07: qty 480

## 2021-05-07 MED ORDER — FENTANYL CITRATE (PF) 100 MCG/2ML IJ SOLN
50.0000 ug | INTRAMUSCULAR | Status: DC | PRN
  Administered 2021-05-07 – 2021-05-08 (×2): 50 ug via INTRAVENOUS
  Filled 2021-05-07 (×2): qty 2

## 2021-05-07 MED ORDER — POTASSIUM CHLORIDE 10 MEQ/100ML IV SOLN
10.0000 meq | INTRAVENOUS | Status: AC
Start: 2021-05-07 — End: 2021-05-07
  Administered 2021-05-07 (×2): 10 meq via INTRAVENOUS
  Filled 2021-05-07 (×2): qty 100

## 2021-05-07 MED ORDER — SODIUM CHLORIDE 0.9 % IV SOLN
3.0000 g | Freq: Two times a day (BID) | INTRAVENOUS | Status: DC
  Administered 2021-05-07 – 2021-05-09 (×4): 3 g via INTRAVENOUS
  Filled 2021-05-07 (×2): qty 3
  Filled 2021-05-07 (×2): qty 8
  Filled 2021-05-07: qty 3

## 2021-05-07 MED ORDER — SODIUM CHLORIDE 0.9% FLUSH
10.0000 mL | INTRAVENOUS | Status: DC | PRN
  Administered 2021-05-27: 40 mL

## 2021-05-07 NOTE — Progress Notes (Signed)
Pharmacy Antibiotic Note  Dana Coleman is a 80 y.o. female admitted on 05/04/2021, now starting CRRT.  Pharmacy has been consulted for Zosyn dosing.  Remains on Eraxis.  Scr continues to trend down to day off of CRRT.  Plan: Increase IV Zosyn to 3.375 g IV q 8 hrs. Watch renal function, cultures and clinical course.  Height: '5\' 1"'$  (154.9 cm) Weight: 58 kg (127 lb 13.9 oz) IBW/kg (Calculated) : 47.8  Temp (24hrs), Avg:100 F (37.8 C), Min:99.14 F (37.3 C), Max:100.76 F (38.2 C)  Recent Labs  Lab 05/04/21 1632 05/04/21 1637 05/04/21 1644 05/04/21 1912 05/04/21 2155 05/05/21 0001 05/05/21 1247 05/05/21 1629 05/05/21 1938 05/06/21 0119 05/06/21 0401 05/07/21 0357  WBC 12.3*  --   --   --   --  7.1  --   --  9.6  --  13.4* 15.3*  CREATININE 4.07*  --    < >  --   --  3.11*   < > 1.80* 1.85* 1.87* 1.90* 1.60*  LATICACIDVEN  --  >11.0*  --  >11.0* >11.0*  --   --   --   --   --   --   --    < > = values in this interval not displayed.     Estimated Creatinine Clearance: 23.4 mL/min (A) (by C-G formula based on SCr of 1.6 mg/dL (H)).    No Known Allergies   Zosyn 7/23 > Eraxis 7/23 >  7/24 blood x2 > ngtd 7/23 pleural fluid > reincubated  [place] Crane, Vena Austria, BCPS, [address] [facility] Clinical Pharmacist  05/07/2021 8:07 AM   [facility] pharmacy phone numbers are listed on [email or web address]

## 2021-05-07 NOTE — Anesthesia Postprocedure Evaluation (Signed)
Anesthesia Post Note  Patient: Dana Coleman  Procedure(s) Performed: THORACOTOMY MAJOR REPAIR PERFORATED ESOPHAGUS (Right: Chest)     Patient location during evaluation: SICU Anesthesia Type: General Level of consciousness: sedated Pain management: pain level controlled Vital Signs Assessment: post-procedure vital signs reviewed and stable Respiratory status: patient remains intubated per anesthesia plan Cardiovascular status: stable Postop Assessment: no apparent nausea or vomiting Anesthetic complications: no   No notable events documented.  Last Vitals:  Vitals:   05/07/21 0716 05/07/21 0719  BP: 124/66   Pulse: 63   Resp: 16   Temp: 37.4 C   SpO2: 95% 98%    Last Pain:  Vitals:   05/06/21 1400  TempSrc:   PainSc: 2                  Eily Louvier S

## 2021-05-07 NOTE — Progress Notes (Addendum)
[facility] Progress Note   S/p lap choly and LOA 05/02/21, esophageal perf w/ rt empyema s/p rt thoracotomy + repair, decortication of rt lung on 05/05/21 with AKI from septic shock.  Assessment/ Plan:   AKI - severe due to septic shock and hypoperfusion. Normal renal function on 04/26/21 w/ creat 0.88.  Creat 4.40 here on admit, suspected ATN due to septic shock/ hypoperfusion.  Started on CRRT 7/24 0400-1100 due to severe acidemia.    - UOP still very good. Held  CRRT at 11am 7/24. - Renal function continues to improve; will sign off at this time. Please reconsult as needed.    Volume- mild vol excess on exam SP cholecystectomy - on 7/21 R loculated effusion - sp chest tube, possible empyema, on IV abx Septic shock - off levo. Metabolic acidosis - improving; HCO3 29 this AM. IDDM - per CCM  Subjective:   seen in ICU. Off Levo, extubated this am UOP good 1305/1420 over past 48hrs   Objective:   BP 124/66   Pulse 72   Temp 99.32 F (37.4 C)   Resp 19   Ht _0  (1.549 m)   Wt 58 kg   SpO2 99%   BMI 24.16 kg/m   Intake/Output Summary (Last 24 hours) at 05/07/2021 1107 Last data filed at 05/07/2021 0900 Gross per 24 hour  Intake 2475.92 ml  Output 1400 ml  Net 1075.92 ml   Weight change:   Physical Exam: Gen extubated; cooperative No jvd or bruits Chest clear anterior/ lateral, R chest pigtail chest tube RRR no MRG Abd soft ntnd no mass or ascites +bs GU foley cath draining clear amber urine Ext tr LE or UE edema Neuro is on vent, sedated  Imaging: DG Chest Port 1 View  Result Date: 05/07/2021 CLINICAL DATA:  Status post thoracotomy four perforated esophagus. EXAM: PORTABLE CHEST 1 VIEW COMPARISON:  Chest x-ray 05/06/2021 FINDINGS: The endotracheal tube, NG tube, right IJ catheter, chest tubes and mediastinal drain tubes are in good position and unchanged. The external pacer paddles have been removed. Small right pleural effusion and streaky  bibasilar atelectasis. No pneumothorax. IMPRESSION: 1. Stable support apparatus. 2. Small right effusion and streaky bibasilar atelectasis. No pneumothorax. Electronically Signed   By: Marijo Sanes M.D.   On: 05/07/2021 07:40   DG CHEST PORT 1 VIEW  Result Date: 05/06/2021 CLINICAL DATA:  ET and chest tubes status post thoracotomy Esophageal perforation EXAM: PORTABLE CHEST 1 VIEW COMPARISON:  05/05/2021 FINDINGS: Heart size within normal limits. No pulmonary vascular congestion. Dilated thoracic aorta again noted. Nasogastric tube extends just below the left hemidiaphragm. Multiple right chest tubes again noted. No significant residual pneumothorax is identified. There is mild bibasilar atelectasis. Temporary right hemodialysis catheter unchanged in position. Endotracheal tube tip located approximately 5 cm above the carina. IMPRESSION: 1. Lines and tubes as above. 2. No pneumothorax. Electronically Signed   By: Miachel Roux M.D.   On: 05/06/2021 07:57   DG Chest Port 1 View  Result Date: 05/05/2021 CLINICAL DATA:  Respiratory distress EXAM: PORTABLE CHEST 1 VIEW COMPARISON:  None. FINDINGS: Support Apparatus: --Endotracheal tube: Tip at the level of the clavicular heads. --Enteric tube:Sideport projects over the distal esophagus. Recommend advancing by 7 cm. --Vascular catheter(s):Right internal jugular vein approach central venous catheter tip is at the cavoatrial junction. --Other: None Right basilar opacities. Lateral right-sided rib fractures. Cardiomediastinal contours are normal. IMPRESSION: 1. Endotracheal tube tip at the level of the clavicular heads. 2. Orogastric tube  sideport projects over the distal esophagus. Recommend advancing by 7 cm. 3. Right basilar opacities, likely atelectasis and pleural effusion. Electronically Signed   By: Ulyses Jarred M.D.   On: 05/05/2021 21:51   ECHOCARDIOGRAM COMPLETE  Result Date: 05/06/2021    ECHOCARDIOGRAM REPORT   Patient Name:   Dana Coleman  Date of Exam: 05/06/2021 Medical Rec #:  [id number]          Height:       61.0 in Accession #:    [id number]         Weight:       127.9 lb Date of Birth:  03-24-1941           BSA:          1.562 m Patient Age:    80 years           BP:           97/42 mmHg Patient Gender: F                  HR:           64 bpm. Exam Location:  Inpatient Procedure: 2D Echo, Color Doppler, Cardiac Doppler and Intracardiac            Opacification Agent Indications:    Abnormal ECG R94.31  History:        Patient has prior history of Echocardiogram examinations, most                 recent 12/12/2017. Arrythmias:Bradycardia and Atrial Fibrillation;                 Risk Factors:Diabetes and Dyslipidemia. Breast Cancer. Acute                 kidney injury. Septic shock.  Sonographer:    Darlina Sicilian RDCS Referring Phys: [id number] [place]  1. Left ventricular ejection fraction, by estimation, is 45%. The left ventricle has mildly decreased function. The left ventricle demonstrates regional wall motion abnormalities with basal to mid inferolateral and basal inferior severe hypokinesis. There is mild left ventricular hypertrophy. Left ventricular diastolic parameters are consistent with Grade I diastolic dysfunction (impaired relaxation).  2. Right ventricular systolic function is severely reduced. The right ventricular size is mildly enlarged. Mildly increased right ventricular wall thickness. There is normal pulmonary artery systolic pressure. The estimated right ventricular systolic pressure is 36.4 mmHg.  3. Right atrial size was mildly dilated.  4. The mitral valve is normal in structure. No evidence of mitral valve regurgitation. No evidence of mitral stenosis.  5. The aortic valve is tricuspid. Aortic valve regurgitation is not visualized. Mild aortic valve sclerosis is present, with no evidence of aortic valve stenosis.  6. Aortic dilatation noted. There is borderline dilatation of the aortic root, measuring 37  mm.  7. The inferior vena cava is normal in size with <50% respiratory variability, suggesting right atrial pressure of 8 mmHg. FINDINGS  Left Ventricle: Left ventricular ejection fraction, by estimation, is 45%. The left ventricle has mildly decreased function. The left ventricle demonstrates regional wall motion abnormalities. The left ventricular internal cavity size was normal in size. There is mild left ventricular hypertrophy. Left ventricular diastolic parameters are consistent with Grade I diastolic dysfunction (impaired relaxation). Right Ventricle: The right ventricular size is mildly enlarged. Mildly increased right ventricular wall thickness. Right ventricular systolic function is severely reduced. There is normal pulmonary artery systolic pressure. The tricuspid regurgitant velocity  is 2.06 m/s, and with an assumed right atrial pressure of 8 mmHg, the estimated right ventricular systolic pressure is 50.5 mmHg. Left Atrium: Left atrial size was normal in size. Right Atrium: Right atrial size was mildly dilated. Pericardium: There is no evidence of pericardial effusion. Mitral Valve: The mitral valve is normal in structure. There is mild calcification of the mitral valve leaflet(s). Mild mitral annular calcification. No evidence of mitral valve regurgitation. No evidence of mitral valve stenosis. Tricuspid Valve: The tricuspid valve is normal in structure. Tricuspid valve regurgitation is trivial. Aortic Valve: The aortic valve is tricuspid. Aortic valve regurgitation is not visualized. Mild aortic valve sclerosis is present, with no evidence of aortic valve stenosis. Pulmonic Valve: The pulmonic valve was normal in structure. Pulmonic valve regurgitation is trivial. Aorta: Aortic dilatation noted. There is borderline dilatation of the aortic root, measuring 37 mm. Venous: The inferior vena cava is normal in size with less than 50% respiratory variability, suggesting right atrial pressure of 8 mmHg.  IAS/Shunts: No atrial level shunt detected by color flow Doppler.  LEFT VENTRICLE PLAX 2D LVIDd:         3.90 cm LVIDs:         3.20 cm LV PW:         0.90 cm LV IVS:        1.50 cm LVOT diam:     2.15 cm LV SV:         46 LV SV Index:   30 LVOT Area:     3.63 cm  RIGHT VENTRICLE TAPSE (M-mode): 1.3 cm LEFT ATRIUM             Index       RIGHT ATRIUM           Index LA diam:        2.20 cm 1.41 cm/m  RA Area:     12.30 cm LA Vol (A2C):   15.0 ml 9.60 ml/m  RA Volume:   24.40 ml  15.62 ml/m LA Vol (A4C):   20.2 ml 12.97 ml/m LA Biplane Vol: 16.3 ml 10.44 ml/m  AORTIC VALVE             PULMONIC VALVE LVOT Vmax:   101.65 cm/s PR End Diast Vel: 1.78 msec LVOT Vmean:  64.150 cm/s LVOT VTI:    0.128 m  AORTA Ao Root diam: 3.70 cm Ao Asc diam:  3.60 cm MITRAL VALVE               TRICUSPID VALVE MV Area (PHT): 3.31 cm    TR Peak grad:   17.0 mmHg MV Decel Time: 229 msec    TR Vmax:        206.00 cm/s MV E velocity: 60.10 cm/s                            SHUNTS                            Systemic VTI:  0.13 m                            Systemic Diam: 2.15 cm Loralie Champagne MD Electronically signed by Loralie Champagne MD Signature Date/Time: 05/06/2021/4:08:32 PM    Final    [place] EKG SITE RITE  Result Date: 05/07/2021 If Site Rite image not attached,  placement could not be confirmed due to current cardiac rhythm.  DG ESOPHAGUS W SINGLE CM (SOL OR THIN BA)  Result Date: 05/05/2021 CLINICAL DATA:  Pneumothorax and clinical concern for esophageal leak. EXAM: ESOPHOGRAM/BARIUM SWALLOW TECHNIQUE: Single contrast examination was performed using water-soluble contrast material. FLUOROSCOPY TIME:  Fluoroscopy Time:  3 minutes and 6 seconds. Radiation Exposure Index (if provided by the fluoroscopic device): 106 mGy Number of Acquired Spot Images: COMPARISON:  Chest abdomen pelvis CT 05/04/2021 FINDINGS: Initial fluoroscopy of the mediastinum shows no residual contrast material from CT scan yesterday. Patient had an existing OG  tube in situ. OG tube was pulled back so that the distal tip was in the region of the esophagogastric junction, and the proximal side port in the distal third of the esophagus. Water-soluble contrast was then introduced via the OG tube into the esophageal lumen. With initial injection, a thin right paraesophageal line of extraluminal contrast material appears, approximately 4 cm below the level of the carina. Approximately 50 cc of contrast was introduced via the OG tube. With continued observation, no continued flow of extraluminal contrast was observed and no frank migration of contrast into the right pleural space could be identified. Subsequently, the patient was turned LPO which revealed a more prominent finger-like projection of extraluminal contrast posterior to the esophagus and tracking to the right. Continued observation showed no free flow of this contrast material into the right pleural space. The OG tube was then flushed with water and repositioned so that the tip was in the mid stomach, placing the proximal port well below the EG junction. The patient's nurse then taped the OG tube back in place. IMPRESSION: Contained finger-like projection of extraluminal contrast arising from the posterior/right posterolateral esophagus approximately 1-2 cm distal to the carina. This extraluminal collection tracks caudally for a total distance of 4-5 cm, but no frank migration of contrast into the right pleural space is identified on this study. Critical Value/emergent results were called by telephone at the time of interpretation on 05/05/2021 at approximately 12:15 pm to provider [facility] , who verbally acknowledged these results. Electronically Signed   By: Misty Stanley M.D.   On: 05/05/2021 12:20    Labs: BMET Recent Labs  Lab 05/04/21 1632 05/04/21 1643 05/05/21 0001 05/05/21 1247 05/05/21 1540 05/05/21 1629 05/05/21 1634 05/05/21 1938 05/05/21 1939 05/05/21 2126 05/06/21 0113 05/06/21 0119  05/06/21 0401 05/07/21 0357 05/07/21 0516  NA 140   < > 139 138   < > 136   < > 137 136 136 135 135 134* 137 138  K 4.4   < > 4.5 4.0   < > 4.3   < > 4.1 4.4 4.3 5.3* 4.7 4.4 3.7 3.7  CL 106   < > 98 96*  --  97*  --  101  --   --   --  99 99 103  --   CO2 8*  --  17* 21*  --   --   --  24  --   --   --  _0 --   GLUCOSE 118*   < > 151* 82  --  113*  --  101*  --   --   --  187* 257* 131*  --   BUN 38*   < > 38* 31*  --  42*  --  29*  --   --   --  26* 29* 29*  --   CREATININE 4.07*   < >  3.11* 2.20*  --  1.80*  --  1.85*  --   --   --  1.87* 1.90* 1.60*  --   CALCIUM 10.0  --  8.7* 8.2*  --   --   --  8.4*  --   --   --  7.9* 7.8* 7.9*  --   PHOS  --   --  5.5* 3.0  --   --   --   --   --   --   --   --   --  2.3*  --    < > = values in this interval not displayed.   CBC Recent Labs  Lab 05/04/21 1632 05/04/21 1643 05/05/21 0001 05/05/21 0803 05/05/21 1540 05/05/21 1938 05/05/21 1939 05/06/21 0113 05/06/21 0401 05/07/21 0357 05/07/21 0516  WBC 12.3*  --  7.1  --   --  9.6  --   --  13.4* 15.3*  --   NEUTROABS 9.5*  --   --   --   --   --   --   --   --   --   --   HGB 10.8*   < > 11.7*  --    < > 11.2*   < > 10.9* 11.2* 10.1* 10.9*  HCT 37.3   < > 36.3  --    < > 32.8*   < > 32.0* 33.6* 29.8* 32.0*  MCV 104.2*  --  91.2  --   --  87.7  --   --  88.7 87.6  --   PLT 168  --  121* 133*  --  98*  --   --  112* 85*  --    < > = values in this interval not displayed.    Medications:     chlorhexidine gluconate (MEDLINE KIT)  15 mL Mouth Rinse BID   Chlorhexidine Gluconate Cloth  6 each Topical Q0600   Chlorhexidine Gluconate Cloth  6 each Topical Daily   insulin aspart  0-24 Units Subcutaneous Q4H   mouth rinse  15 mL Mouth Rinse 10 times per day   pantoprazole (PROTONIX) IV  40 mg Intravenous QHS   sodium chloride flush  10-40 mL Intracatheter Q12H      Otelia Santee, MD 05/07/2021, 11:07 AM

## 2021-05-07 NOTE — Progress Notes (Signed)
PHARMACY - TOTAL PARENTERAL NUTRITION CONSULT NOTE   Indication: s/p esophageal perforation and repair  Patient Measurements: Height: '5\' 1"'$  (154.9 cm) Weight: 58 kg (127 lb 13.9 oz) IBW/kg (Calculated) : 47.8   Body mass index is 24.16 kg/m. Usual Weight:   Assessment: 80 y/o F admitted 7/23 now s/p gall bladder surgery and esophageal perforation-repaired 7/24.  - PMH: HTN, HLD, DM, R breast cancer, CAD  Glucose / Insulin: DM. SSI 26 units/24h. CBGs 97-280 but have improved over the last 24h.  Electrolytes: K+ 3.7. CoCa 9.7. Phos 2.3 low. Mg 2.7 high  Renal: Septic ATN s/p CRRT (end 7/25). Scr 1.6 down (baseline Scr 0.88). UOP 1420.  Hepatic: AST/ALT up 225/238, Tbiili 1.1 ok  Intake / Output; MIVF:  UOP 1420. Chest tube 100cc. I/O +1.7L - D5NS 126m/hr  Cards: 7/25 NSTEMI. No intervention yet. Afib. Dopamine at 10 mcg/kg/min  GI meds: IV PPI/hs  GI Imaging: - 7/23 CT: No liver abnormality. Pncreas unremarkable. Small spleen. BL renal masses. Normal stomach/bowels.   GI Surgeries / Procedures:  - 7/21: S/P laparoscopic cholecystectomy and LOA - 7/24: S/P R thoracotomy, repair of esophageal perforation with muscle flap, drainage of empyema and decortication of R lung  Central access: PICC 7/26 TPN start date: 7/26  Nutritional Goals (per RD recommendation on  ): kCal:  , Protein:  , Fluid:   Goal TPN rate is   mL/hr (provides   g of protein and   kcals per day)  Current Nutrition:  NPO  Plan:  Await PICC placement and RD assessment Start TPN at 435mhr at 1800 Electrolytes in TPN: Na 5046mL, K 46m69m, Ca 5mEq16m Mg 0mEq/40mand Phos 20mmol75mCl:Ac 1:1 based on residual AKI. Add standard MVI and trace elements to TPN Initiate Moderate q4h SSI and adjust as needed  Add insulin to TPN depending on TPN response to nutritional glucose tomorrow. Reduce MIVF to D5NS at 60mL/hr11m1800 Monitor TPN labs on Mon/Thurs, and PRN. KCL 10meq IV86m per MD   Ashira Kirsten S.  RobertsonAlford Highland BCPS Clinical Staff Pharmacist [email or web address] RobertsonAlford Highland [email or web address],10:07 AM

## 2021-05-07 NOTE — Progress Notes (Signed)
[facility] Progress Note Patient Name: [name] DOB: 1941/09/21 MRN: [id number]   Date of Service  05/07/2021  HPI/Events of Note  Phos 2.3. K 3.7. Cr 1.60. Patient has been off CRRT since yesterday.   eICU Interventions  No action indicated at this time. CTM.     Intervention Category Minor Interventions: Electrolytes abnormality - evaluation and management  Charlott Rakes 05/07/2021, 6:35 AM

## 2021-05-07 NOTE — Progress Notes (Signed)
Progress Note  2 Days Post-Op  Subjective: Awakens on vent. No acute change from an abdominal standpoint.  Objective: Vital signs in last 24 hours: Temp:  [99.32 F (37.4 C)-100.76 F (38.2 C)] 99.32 F (37.4 C) (07/26 0716) Pulse Rate:  [35-104] 63 (07/26 0716) Resp:  [16-25] 16 (07/26 0716) BP: (81-124)/(42-67) 124/66 (07/26 0716) SpO2:  [94 %-100 %] 98 % (07/26 0719) Arterial Line BP: (81-158)/(35-95) 128/57 (07/26 0700) FiO2 (%):  [40 %] 40 % (07/26 0719) Last BM Date:  (PTA)  Intake/Output from previous day: 07/25 0701 - 07/26 0700 In: 2869.9 [I.V.:2590; IV Piggyback:279.9] Out: 1510 [Urine:1420; Chest Tube:90] Intake/Output this shift: Total I/O In: 97.4 [I.V.:97.4] Out: 225 [Urine:125; Chest Tube:100]  PE: General: WD, ill appearing female who is laying in bed on the vent Heart: initially bigeminy and then irregularly irregular.  Palpable radial and pedal pulses bilaterally Lungs: vent, chest tubes in place Abd: soft, NT, ND, incisions c/d/i     Lab Results:  Recent Labs    05/06/21 0401 05/07/21 0357 05/07/21 0516  WBC 13.4* 15.3*  --   HGB 11.2* 10.1* 10.9*  HCT 33.6* 29.8* 32.0*  PLT 112* 85*  --    BMET Recent Labs    05/06/21 0401 05/07/21 0357 05/07/21 0516  NA 134* 137 138  K 4.4 3.7 3.7  CL 99 103  --   CO2 23 26  --   GLUCOSE 257* 131*  --   BUN 29* 29*  --   CREATININE 1.90* 1.60*  --   CALCIUM 7.8* 7.9*  --    PT/INR Recent Labs    05/05/21 0001 05/05/21 0803  LABPROT 21.5* 21.6*  INR 1.9* 1.9*   CMP     Component Value Date/Time   NA 138 05/07/2021 0516   NA 141 08/14/2020 1415   NA 142 08/15/2013 1409   K 3.7 05/07/2021 0516   K 3.9 08/15/2013 1409   CL 103 05/07/2021 0357   CL 105 04/04/2013 1611   CO2 26 05/07/2021 0357   CO2 24 08/15/2013 1409   GLUCOSE 131 (H) 05/07/2021 0357   GLUCOSE 123 08/15/2013 1409   GLUCOSE 162 (H) 04/04/2013 1611   BUN 29 (H) 05/07/2021 0357   BUN 10 08/14/2020 1415   BUN 12.2  08/15/2013 1409   CREATININE 1.60 (H) 05/07/2021 0357   CREATININE 0.91 12/08/2014 1412   CREATININE 0.9 08/15/2013 1409   CALCIUM 7.9 (L) 05/07/2021 0357   CALCIUM 9.8 08/15/2013 1409   PROT 4.9 (L) 05/07/2021 0357   PROT 7.4 08/15/2013 1409   ALBUMIN 1.8 (L) 05/07/2021 0357   ALBUMIN 3.4 (L) 08/15/2013 1409   AST 225 (H) 05/07/2021 0357   AST 22 08/15/2013 1409   ALT 238 (H) 05/07/2021 0357   ALT 11 08/15/2013 1409   ALKPHOS 93 05/07/2021 0357   ALKPHOS 50 08/15/2013 1409   BILITOT 1.1 05/07/2021 0357   BILITOT 0.23 08/15/2013 1409   GFRNONAA 33 (L) 05/07/2021 0357   GFRAA 72 08/14/2020 1415   Lipase     Component Value Date/Time   LIPASE 34 01/21/2021 1115       Studies/Results: DG Chest Port 1 View  Result Date: 05/07/2021 CLINICAL DATA:  Status post thoracotomy four perforated esophagus. EXAM: PORTABLE CHEST 1 VIEW COMPARISON:  Chest x-ray 05/06/2021 FINDINGS: The endotracheal tube, NG tube, right IJ catheter, chest tubes and mediastinal drain tubes are in good position and unchanged. The external pacer paddles have been removed. Small right pleural  effusion and streaky bibasilar atelectasis. No pneumothorax. IMPRESSION: 1. Stable support apparatus. 2. Small right effusion and streaky bibasilar atelectasis. No pneumothorax. Electronically Signed   By: Marijo Sanes M.D.   On: 05/07/2021 07:40   DG CHEST PORT 1 VIEW  Result Date: 05/06/2021 CLINICAL DATA:  ET and chest tubes status post thoracotomy Esophageal perforation EXAM: PORTABLE CHEST 1 VIEW COMPARISON:  05/05/2021 FINDINGS: Heart size within normal limits. No pulmonary vascular congestion. Dilated thoracic aorta again noted. Nasogastric tube extends just below the left hemidiaphragm. Multiple right chest tubes again noted. No significant residual pneumothorax is identified. There is mild bibasilar atelectasis. Temporary right hemodialysis catheter unchanged in position. Endotracheal tube tip located approximately 5 cm  above the carina. IMPRESSION: 1. Lines and tubes as above. 2. No pneumothorax. Electronically Signed   By: Miachel Roux M.D.   On: 05/06/2021 07:57   DG Chest Port 1 View  Result Date: 05/05/2021 CLINICAL DATA:  Respiratory distress EXAM: PORTABLE CHEST 1 VIEW COMPARISON:  None. FINDINGS: Support Apparatus: --Endotracheal tube: Tip at the level of the clavicular heads. --Enteric tube:Sideport projects over the distal esophagus. Recommend advancing by 7 cm. --Vascular catheter(s):Right internal jugular vein approach central venous catheter tip is at the cavoatrial junction. --Other: None Right basilar opacities. Lateral right-sided rib fractures. Cardiomediastinal contours are normal. IMPRESSION: 1. Endotracheal tube tip at the level of the clavicular heads. 2. Orogastric tube sideport projects over the distal esophagus. Recommend advancing by 7 cm. 3. Right basilar opacities, likely atelectasis and pleural effusion. Electronically Signed   By: Ulyses Jarred M.D.   On: 05/05/2021 21:51   ECHOCARDIOGRAM COMPLETE  Result Date: 05/06/2021    ECHOCARDIOGRAM REPORT   Patient Name:   Dana Coleman Date of Exam: 05/06/2021 Medical Rec #:  [id number]          Height:       61.0 in Accession #:    [id number]         Weight:       127.9 lb Date of Birth:  03/09/1941           BSA:          1.562 m Patient Age:    80 years           BP:           97/42 mmHg Patient Gender: F                  HR:           64 bpm. Exam Location:  Inpatient Procedure: 2D Echo, Color Doppler, Cardiac Doppler and Intracardiac            Opacification Agent Indications:    Abnormal ECG R94.31  History:        Patient has prior history of Echocardiogram examinations, most                 recent 12/12/2017. Arrythmias:Bradycardia and Atrial Fibrillation;                 Risk Factors:Diabetes and Dyslipidemia. Breast Cancer. Acute                 kidney injury. Septic shock.  Sonographer:    Darlina Sicilian RDCS Referring Phys: [id number] [place]  1. Left ventricular ejection fraction, by estimation, is 45%. The left ventricle has mildly decreased function. The left ventricle demonstrates regional wall motion abnormalities with basal to mid inferolateral and basal  inferior severe hypokinesis. There is mild left ventricular hypertrophy. Left ventricular diastolic parameters are consistent with Grade I diastolic dysfunction (impaired relaxation).  2. Right ventricular systolic function is severely reduced. The right ventricular size is mildly enlarged. Mildly increased right ventricular wall thickness. There is normal pulmonary artery systolic pressure. The estimated right ventricular systolic pressure is [id number] mmHg.  3. Right atrial size was mildly dilated.  4. The mitral valve is normal in structure. No evidence of mitral valve regurgitation. No evidence of mitral stenosis.  5. The aortic valve is tricuspid. Aortic valve regurgitation is not visualized. Mild aortic valve sclerosis is present, with no evidence of aortic valve stenosis.  6. Aortic dilatation noted. There is borderline dilatation of the aortic root, measuring 37 mm.  7. The inferior vena cava is normal in size with <50% respiratory variability, suggesting right atrial pressure of 8 mmHg. FINDINGS  Left Ventricle: Left ventricular ejection fraction, by estimation, is 45%. The left ventricle has mildly decreased function. The left ventricle demonstrates regional wall motion abnormalities. The left ventricular internal cavity size was normal in size. There is mild left ventricular hypertrophy. Left ventricular diastolic parameters are consistent with Grade I diastolic dysfunction (impaired relaxation). Right Ventricle: The right ventricular size is mildly enlarged. Mildly increased right ventricular wall thickness. Right ventricular systolic function is severely reduced. There is normal pulmonary artery systolic pressure. The tricuspid regurgitant velocity is 2.06 m/s, and with  an assumed right atrial pressure of 8 mmHg, the estimated right ventricular systolic pressure is [id number] mmHg. Left Atrium: Left atrial size was normal in size. Right Atrium: Right atrial size was mildly dilated. Pericardium: There is no evidence of pericardial effusion. Mitral Valve: The mitral valve is normal in structure. There is mild calcification of the mitral valve leaflet(s). Mild mitral annular calcification. No evidence of mitral valve regurgitation. No evidence of mitral valve stenosis. Tricuspid Valve: The tricuspid valve is normal in structure. Tricuspid valve regurgitation is trivial. Aortic Valve: The aortic valve is tricuspid. Aortic valve regurgitation is not visualized. Mild aortic valve sclerosis is present, with no evidence of aortic valve stenosis. Pulmonic Valve: The pulmonic valve was normal in structure. Pulmonic valve regurgitation is trivial. Aorta: Aortic dilatation noted. There is borderline dilatation of the aortic root, measuring 37 mm. Venous: The inferior vena cava is normal in size with less than 50% respiratory variability, suggesting right atrial pressure of 8 mmHg. IAS/Shunts: No atrial level shunt detected by color flow Doppler.  LEFT VENTRICLE PLAX 2D LVIDd:         3.90 cm LVIDs:         3.20 cm LV PW:         0.90 cm LV IVS:        1.50 cm LVOT diam:     2.15 cm LV SV:         46 LV SV Index:   30 LVOT Area:     3.63 cm  RIGHT VENTRICLE TAPSE (M-mode): 1.3 cm LEFT ATRIUM             Index       RIGHT ATRIUM           Index LA diam:        2.20 cm 1.41 cm/m  RA Area:     12.30 cm LA Vol (A2C):   15.0 ml 9.60 ml/m  RA Volume:   24.40 ml  15.62 ml/m LA Vol (A4C):   20.2 ml 12.97 ml/m LA Biplane Vol:  16.3 ml 10.44 ml/m  AORTIC VALVE             PULMONIC VALVE LVOT Vmax:   101.65 cm/s PR End Diast Vel: 1.78 msec LVOT Vmean:  64.150 cm/s LVOT VTI:    0.128 m  AORTA Ao Root diam: 3.70 cm Ao Asc diam:  3.60 cm MITRAL VALVE               TRICUSPID VALVE MV Area (PHT): 3.31 cm     TR Peak grad:   17.0 mmHg MV Decel Time: 229 msec    TR Vmax:        206.00 cm/s MV E velocity: 60.10 cm/s                            SHUNTS                            Systemic VTI:  0.13 m                            Systemic Diam: 2.15 cm Loralie Champagne MD Electronically signed by Loralie Champagne MD Signature Date/Time: 05/06/2021/4:08:32 PM    Final    [place] EKG SITE RITE  Result Date: 05/07/2021 If Site Rite image not attached, placement could not be confirmed due to current cardiac rhythm.  DG ESOPHAGUS W SINGLE CM (SOL OR THIN BA)  Result Date: 05/05/2021 CLINICAL DATA:  Pneumothorax and clinical concern for esophageal leak. EXAM: ESOPHOGRAM/BARIUM SWALLOW TECHNIQUE: Single contrast examination was performed using water-soluble contrast material. FLUOROSCOPY TIME:  Fluoroscopy Time:  3 minutes and 6 seconds. Radiation Exposure Index (if provided by the fluoroscopic device): 106 mGy Number of Acquired Spot Images: COMPARISON:  Chest abdomen pelvis CT 05/04/2021 FINDINGS: Initial fluoroscopy of the mediastinum shows no residual contrast material from CT scan yesterday. Patient had an existing OG tube in situ. OG tube was pulled back so that the distal tip was in the region of the esophagogastric junction, and the proximal side port in the distal third of the esophagus. Water-soluble contrast was then introduced via the OG tube into the esophageal lumen. With initial injection, a thin right paraesophageal line of extraluminal contrast material appears, approximately 4 cm below the level of the carina. Approximately 50 cc of contrast was introduced via the OG tube. With continued observation, no continued flow of extraluminal contrast was observed and no frank migration of contrast into the right pleural space could be identified. Subsequently, the patient was turned LPO which revealed a more prominent finger-like projection of extraluminal contrast posterior to the esophagus and tracking to the right. Continued  observation showed no free flow of this contrast material into the right pleural space. The OG tube was then flushed with water and repositioned so that the tip was in the mid stomach, placing the proximal port well below the EG junction. The patient's nurse then taped the OG tube back in place. IMPRESSION: Contained finger-like projection of extraluminal contrast arising from the posterior/right posterolateral esophagus approximately 1-2 cm distal to the carina. This extraluminal collection tracks caudally for a total distance of 4-5 cm, but no frank migration of contrast into the right pleural space is identified on this study. Critical Value/emergent results were called by telephone at the time of interpretation on 05/05/2021 at approximately 12:15 pm to provider [facility] , who verbally acknowledged  these results. Electronically Signed   By: Misty Stanley M.D.   On: 05/05/2021 12:20    Anti-infectives: Anti-infectives (From admission, onward)    Start     Dose/Rate Route Frequency Ordered Stop   05/07/21 1400  piperacillin-tazobactam (ZOSYN) IVPB 3.375 g        3.375 g 12.5 mL/hr over 240 Minutes Intravenous Every 8 hours 05/07/21 0810     05/06/21 1245  anidulafungin (ERAXIS) 100 mg in sodium chloride 0.9 % 100 mL IVPB       See Hyperspace for full Linked Orders Report.   100 mg 78 mL/hr over 100 Minutes Intravenous Every 24 hours 05/05/21 1145     05/06/21 0600  piperacillin-tazobactam (ZOSYN) IVPB 2.25 g  Status:  Discontinued        2.25 g 100 mL/hr over 30 Minutes Intravenous Every 8 hours 05/05/21 2231 05/07/21 0808   05/05/21 1245  anidulafungin (ERAXIS) 200 mg in sodium chloride 0.9 % 200 mL IVPB       See Hyperspace for full Linked Orders Report.   200 mg 78 mL/hr over 200 Minutes Intravenous  Once 05/05/21 1145 05/05/21 2152   05/05/21 1000  piperacillin-tazobactam (ZOSYN) IVPB 3.375 g  Status:  Discontinued        3.375 g 12.5 mL/hr over 240 Minutes Intravenous Every 12 hours  05/04/21 2021 05/04/21 2334   05/05/21 0400  piperacillin-tazobactam (ZOSYN) IVPB 3.375 g  Status:  Discontinued        3.375 g 100 mL/hr over 30 Minutes Intravenous Every 6 hours 05/04/21 2335 05/05/21 2231   05/05/21 0100  doxycycline (VIBRAMYCIN) 100 mg in sodium chloride 0.9 % 250 mL IVPB  Status:  Discontinued        100 mg 125 mL/hr over 120 Minutes Intravenous Every 12 hours 05/05/21 0003 05/05/21 1158   05/04/21 2115  piperacillin-tazobactam (ZOSYN) IVPB 3.375 g        3.375 g 100 mL/hr over 30 Minutes Intravenous STAT 05/04/21 2016 05/04/21 2153        Assessment/Plan Chronic calculous cholecystitis Fitz-Hugh Curtis adhesions S/P laparoscopic cholecystectomy and LOA 05/02/21 Dr. Redmond Pulling - abdominal incisions c/d/I, abdomen is soft - no recs from an abdominal standpoint at this time, will be available as needed   Esophageal perforation with right empyema S/P R thoracotomy, repair of esophageal perforation with muscle flap, drainage of empyema and decortication of R lung 05/05/21 Dr. Cyndia Bent - per TCTS, chest tubes in place and NPO     FEN: NPO, IVF, NGT to LIWS VTE: SCDs ID: Zosyn/Eraxis 7/24>> Foley: present   - per CCM - Septic shock - on levo VDRF - per CCM Acute renal failure - CRRT per nephrology HTN HLD T2DM - SSI  LOS: 3 days    Norm Parcel, [facility] 05/07/2021, 9:23 AM Please see Amion for pager number during day hours 7:00am-4:30pm

## 2021-05-07 NOTE — Procedures (Signed)
Extubation Procedure Note  Patient Details:   Name: LAPRECIOUS SALMONSEN DOB: 11-Feb-1941 MRN: [id number]   Airway Documentation:    Vent end date: 05/07/21 Vent end time: 1003   Evaluation  O2 sats: stable throughout Complications: No apparent complications Patient did tolerate procedure well. Bilateral Breath Sounds: Clear, Diminished   Yes  Patient extubated per order to 4L [place] with no apparent complications. Positive cuff leak was noted prior to extubation. Patient is alert, has a strong cough, and is able to weakly speak. Vitals are stable. RT will continue to monitor.   Arvine Clayburn Clyda Greener 05/07/2021, 10:08 AM

## 2021-05-07 NOTE — Progress Notes (Signed)
Peripherally Inserted Central Catheter Placement  The IV Nurse has discussed with the patient and/or persons authorized to consent for the patient, the purpose of this procedure and the potential benefits and risks involved with this procedure.  The benefits include less needle sticks, lab draws from the catheter, and the patient may be discharged home with the catheter. Risks include, but not limited to, infection, bleeding, blood clot (thrombus formation), and puncture of an artery; nerve damage and irregular heartbeat and possibility to perform a PICC exchange if needed/ordered by physician.  Alternatives to this procedure were also discussed.  Bard Power PICC patient education guide, fact sheet on infection prevention and patient information card has been provided to patient /or left at bedside.    PICC Placement Documentation  PICC Double Lumen 05/07/21 PICC Left Brachial 40 cm 0 cm (Active)  Indication for Insertion or Continuance of Line Administration of hyperosmolar/irritating solutions (i.e. TPN, Vancomycin, etc.) 05/07/21 1331  Exposed Catheter (cm) 0 cm 05/07/21 1331  Site Assessment Clean;Dry;Intact 05/07/21 1331  Lumen #1 Status Flushed;Saline locked;Blood return noted 05/07/21 1331  Lumen #2 Status Flushed;Saline locked;Blood return noted 05/07/21 1331  Dressing Type Transparent;Securing device 05/07/21 1331  Dressing Status Clean;Dry;Intact 05/07/21 1331  Antimicrobial disc in place? Yes 05/07/21 1331  Safety Lock Not Applicable [id number]  Dressing Intervention New dressing;Other (Comment) 05/07/21 1331  Dressing Change Due 05/14/21 05/07/21 1331       Enos Fling 05/07/2021, 1:33 PM

## 2021-05-07 NOTE — Progress Notes (Addendum)
NAME:  XIARA BRADT, MRN:  [id number], DOB:  1941-07-30, LOS: 3 ADMISSION DATE:  05/04/2021, CONSULTATION DATE:  05/04/21 REFERRING MD:  Ralene Bathe, CHIEF COMPLAINT:  SOB  History of Present Illness:  Post op GB surgery Worsened abd post post Brought in by EMS, began becoming brady, hypoxemic to 70s, intubated CXR with large loculated R effusion CTA ordered Labs pending PCCM consulted for admission Patient currently intubated and sedated  Pertinent  Medical History  Prior R breast cancer DM CAD HLD HTN  Significant Hospital Events: Including procedures, antibiotic start and stop dates in addition to other pertinent events   7/23 admitted intubated. Pigtail by CCS in ED -- interestingly seems to have bilious outpt  7/24 Starting CRRT. Abundant GPCs in pleural fluid 7/24 1.  Right thoracotomy 2.  Repair of esophageal perforation with intercostal pedicled muscle flap. 3.  Drainage of empyema and decortication of right lung 7/25 early AM EKG changes, trop leak, probable inferiror NSTEMI, not candidate for intervention.  Echo with mildly reduced LV, large RV ?cause  Interim History / Subjective:  No events. Weaning sedation, on SBT Remains on levophed and dopamine  Objective   Blood pressure 124/66, pulse 63, temperature 99.32 F (37.4 C), resp. rate 16, height '5\' 1"'$  (1.549 m), weight 58 kg, SpO2 98 %.    Vent Mode: PSV;CPAP FiO2 (%):  [40 %] 40 % Set Rate:  [16 bmp] 16 bmp Vt Set:  [440 mL] 440 mL PEEP:  [5 cmH20] 5 cmH20 Pressure Support:  [10 cmH20] 10 cmH20 Plateau Pressure:  [11 cmH20-20 cmH20] 20 cmH20   Intake/Output Summary (Last 24 hours) at 05/07/2021 0751 Last data filed at 05/07/2021 0700 Gross per 24 hour  Intake 2869.85 ml  Output 1510 ml  Net 1359.85 ml    Filed Weights   05/04/21 1625  Weight: 58 kg    Examination: No distress Chest tube with cloudy output, no air leak Bradycardic, irregular Lungs clear, triggering vent Abdomen soft with  hypoactive BS OGT with yellow debris filled material Moving all 4 ext to command but sonolent  Cr improved Congestive hepatopathy Albumin low  Resolved Hospital Problem list     Assessment & Plan:  Esophageal perforation with septic shock post flap repair 7/24 Acute hypoxemic respiratory failure from strep/yeast empyema from perforation post decortication 7/24 Septic ATN improving Post op lap chole 7/21 NSTEMI with WMA on echo not candidate for intervention at present DM2 with variable sugars Afib slow VR- wonder if vagal irritation but could be from NSTEMI as above Congestive hepatopathy- monitor  - Wean pressors to MAP 65 - Dopamine fine for now for heart rate - Continue gentle LR - Drain management, diet advancement per TCTS - Eraxis and zosyn duration TBD - hopefully extubate today - Probably needs LHC prior to DC - Monitor UoP, avoid nephrotoxins  Best Practice (right click and "Reselect all SmartList Selections" daily)   Diet/type: NPO DVT prophylaxis: SCD, chemical when cleared by TCTS GI prophylaxis: N/A Lines: HD and radial line Foley:  Yes, and it is still needed Code Status:  full code Last date of multidisciplinary goals of care discussion- full recovery hoped for   Patient critically ill due to shock, respiratory failure Interventions to address this today pressor and vent titration Risk of deterioration without these interventions is high  I personally spent 32 minutes providing critical care not including any separately billable procedures  Erskine Emery MD [place] [facility]  Prefer epic messenger for cross cover  needs If after hours, please call E-link

## 2021-05-08 ENCOUNTER — Inpatient Hospital Stay (HOSPITAL_COMMUNITY): Payer: Medicare Other

## 2021-05-08 DIAGNOSIS — J9601 Acute respiratory failure with hypoxia: Secondary | ICD-10-CM | POA: Diagnosis not present

## 2021-05-08 DIAGNOSIS — N179 Acute kidney failure, unspecified: Secondary | ICD-10-CM | POA: Diagnosis not present

## 2021-05-08 DIAGNOSIS — Z9689 Presence of other specified functional implants: Secondary | ICD-10-CM

## 2021-05-08 LAB — POCT I-STAT EG7
Acid-base deficit: 21 mmol/L — ABNORMAL HIGH (ref 0.0–2.0)
Bicarbonate: 7.6 mmol/L — ABNORMAL LOW (ref 20.0–28.0)
Calcium, Ion: 1.14 mmol/L — ABNORMAL LOW (ref 1.15–1.40)
HCT: 36 % (ref 36.0–46.0)
Hemoglobin: 12.2 g/dL (ref 12.0–15.0)
O2 Saturation: 97 %
Potassium: 8 mmol/L (ref 3.5–5.1)
Sodium: 132 mmol/L — ABNORMAL LOW (ref 135–145)
TCO2: 8 mmol/L — ABNORMAL LOW (ref 22–32)
pCO2, Ven: 26.1 mmHg — ABNORMAL LOW (ref 44.0–60.0)
pH, Ven: 7.072 — CL (ref 7.250–7.430)
pO2, Ven: 120 mmHg — ABNORMAL HIGH (ref 32.0–45.0)

## 2021-05-08 LAB — COMPREHENSIVE METABOLIC PANEL
ALT: 171 U/L — ABNORMAL HIGH (ref 0–44)
ALT: 188 U/L — ABNORMAL HIGH (ref 0–44)
ALT: 160 U/L — ABNORMAL HIGH (ref 0–44)
AST: 133 U/L — ABNORMAL HIGH (ref 15–41)
AST: 140 U/L — ABNORMAL HIGH (ref 15–41)
AST: 106 U/L — ABNORMAL HIGH (ref 15–41)
Albumin: 1.7 g/dL — ABNORMAL LOW (ref 3.5–5.0)
Albumin: 1.8 g/dL — ABNORMAL LOW (ref 3.5–5.0)
Albumin: 1.8 g/dL — ABNORMAL LOW (ref 3.5–5.0)
Alkaline Phosphatase: 87 U/L (ref 38–126)
Alkaline Phosphatase: 91 U/L (ref 38–126)
Alkaline Phosphatase: 87 U/L (ref 38–126)
Anion gap: 8 (ref 5–15)
Anion gap: 7 (ref 5–15)
Anion gap: 3 — ABNORMAL LOW (ref 5–15)
BUN: 30 mg/dL — ABNORMAL HIGH (ref 8–23)
BUN: 30 mg/dL — ABNORMAL HIGH (ref 8–23)
BUN: 27 mg/dL — ABNORMAL HIGH (ref 8–23)
CO2: 26 mmol/L (ref 22–32)
CO2: 28 mmol/L (ref 22–32)
CO2: 29 mmol/L (ref 22–32)
Calcium: 8.4 mg/dL — ABNORMAL LOW (ref 8.9–10.3)
Calcium: 8.4 mg/dL — ABNORMAL LOW (ref 8.9–10.3)
Calcium: 8.7 mg/dL — ABNORMAL LOW (ref 8.9–10.3)
Chloride: 101 mmol/L (ref 98–111)
Chloride: 105 mmol/L (ref 98–111)
Chloride: 108 mmol/L (ref 98–111)
Creatinine, Ser: 1.28 mg/dL — ABNORMAL HIGH (ref 0.44–1.00)
Creatinine, Ser: 1.25 mg/dL — ABNORMAL HIGH (ref 0.44–1.00)
Creatinine, Ser: 1.04 mg/dL — ABNORMAL HIGH (ref 0.44–1.00)
GFR, Estimated: 43 mL/min — ABNORMAL LOW (ref >60)
GFR, Estimated: 44 mL/min — ABNORMAL LOW (ref >60)
GFR, Estimated: 55 mL/min — ABNORMAL LOW (ref >60)
Glucose, Bld: 735 mg/dL (ref 70–99)
Glucose, Bld: 207 mg/dL — ABNORMAL HIGH (ref 70–99)
Glucose, Bld: 231 mg/dL — ABNORMAL HIGH (ref 70–99)
Potassium: 4.7 mmol/L (ref 3.5–5.1)
Potassium: 3.5 mmol/L (ref 3.5–5.1)
Potassium: 4 mmol/L (ref 3.5–5.1)
Sodium: 135 mmol/L (ref 135–145)
Sodium: 140 mmol/L (ref 135–145)
Sodium: 140 mmol/L (ref 135–145)
Total Bilirubin: 0.5 mg/dL (ref 0.3–1.2)
Total Bilirubin: 0.7 mg/dL (ref 0.3–1.2)
Total Bilirubin: 0.6 mg/dL (ref 0.3–1.2)
Total Protein: 4.6 g/dL — ABNORMAL LOW (ref 6.5–8.1)
Total Protein: 5 g/dL — ABNORMAL LOW (ref 6.5–8.1)
Total Protein: 5.2 g/dL — ABNORMAL LOW (ref 6.5–8.1)

## 2021-05-08 LAB — POCT I-STAT, CHEM 8
BUN: 60 mg/dL — ABNORMAL HIGH (ref 8–23)
Calcium, Ion: 1.16 mmol/L (ref 1.15–1.40)
Chloride: 111 mmol/L (ref 98–111)
Creatinine, Ser: 4.4 mg/dL — ABNORMAL HIGH (ref 0.44–1.00)
Glucose, Bld: 102 mg/dL — ABNORMAL HIGH (ref 70–99)
HCT: 37 % (ref 36.0–46.0)
Hemoglobin: 12.6 g/dL (ref 12.0–15.0)
Potassium: 8 mmol/L (ref 3.5–5.1)
Sodium: 133 mmol/L — ABNORMAL LOW (ref 135–145)
TCO2: 10 mmol/L — ABNORMAL LOW (ref 22–32)

## 2021-05-08 LAB — CBC
HCT: 26.9 % — ABNORMAL LOW (ref 36.0–46.0)
HCT: 28.4 % — ABNORMAL LOW (ref 36.0–46.0)
Hemoglobin: 9 g/dL — ABNORMAL LOW (ref 12.0–15.0)
Hemoglobin: 9.5 g/dL — ABNORMAL LOW (ref 12.0–15.0)
MCH: 30.3 pg (ref 26.0–34.0)
MCH: 29.8 pg (ref 26.0–34.0)
MCHC: 33.5 g/dL (ref 30.0–36.0)
MCHC: 33.5 g/dL (ref 30.0–36.0)
MCV: 90.6 fL (ref 80.0–100.0)
MCV: 89 fL (ref 80.0–100.0)
Platelets: 66 10*3/uL — ABNORMAL LOW (ref 150–400)
Platelets: 75 10*3/uL — ABNORMAL LOW (ref 150–400)
RBC: 2.97 MIL/uL — ABNORMAL LOW (ref 3.87–5.11)
RBC: 3.19 MIL/uL — ABNORMAL LOW (ref 3.87–5.11)
RDW: 15.3 % (ref 11.5–15.5)
RDW: 15 % (ref 11.5–15.5)
WBC: 10.6 10*3/uL — ABNORMAL HIGH (ref 4.0–10.5)
WBC: 11.6 10*3/uL — ABNORMAL HIGH (ref 4.0–10.5)
nRBC: 0.6 % — ABNORMAL HIGH (ref 0.0–0.2)
nRBC: 0.6 % — ABNORMAL HIGH (ref 0.0–0.2)

## 2021-05-08 LAB — DIFFERENTIAL
Abs Immature Granulocytes: 0.14 10*3/uL — ABNORMAL HIGH (ref 0.00–0.07)
Basophils Absolute: 0 10*3/uL (ref 0.0–0.1)
Basophils Relative: 0 %
Eosinophils Absolute: 0 10*3/uL (ref 0.0–0.5)
Eosinophils Relative: 0 %
Immature Granulocytes: 1 %
Lymphocytes Relative: 7 %
Lymphs Abs: 0.8 10*3/uL (ref 0.7–4.0)
Monocytes Absolute: 0.8 10*3/uL (ref 0.1–1.0)
Monocytes Relative: 7 %
Neutro Abs: 8.9 10*3/uL — ABNORMAL HIGH (ref 1.7–7.7)
Neutrophils Relative %: 85 %

## 2021-05-08 LAB — GLUCOSE, CAPILLARY
Glucose-Capillary: 215 mg/dL — ABNORMAL HIGH (ref 70–99)
Glucose-Capillary: >600 mg/dL (ref 70–99)
Glucose-Capillary: 180 mg/dL — ABNORMAL HIGH (ref 70–99)
Glucose-Capillary: 170 mg/dL — ABNORMAL HIGH (ref 70–99)
Glucose-Capillary: 219 mg/dL — ABNORMAL HIGH (ref 70–99)
Glucose-Capillary: 200 mg/dL — ABNORMAL HIGH (ref 70–99)

## 2021-05-08 LAB — PHOSPHORUS
Phosphorus: 5.9 mg/dL — ABNORMAL HIGH (ref 2.5–4.6)
Phosphorus: 2.4 mg/dL — ABNORMAL LOW (ref 2.5–4.6)
Phosphorus: 3.2 mg/dL (ref 2.5–4.6)

## 2021-05-08 LAB — PREALBUMIN
Prealbumin: 5.1 mg/dL — ABNORMAL LOW (ref 18–38)
Prealbumin: 6.1 mg/dL — ABNORMAL LOW (ref 18–38)

## 2021-05-08 LAB — BODY FLUID CULTURE W GRAM STAIN

## 2021-05-08 LAB — MAGNESIUM
Magnesium: 2.3 mg/dL (ref 1.7–2.4)
Magnesium: 2.3 mg/dL (ref 1.7–2.4)
Magnesium: 2 mg/dL (ref 1.7–2.4)

## 2021-05-08 LAB — TRIGLYCERIDES
Triglycerides: 376 mg/dL — ABNORMAL HIGH (ref <150)
Triglycerides: 180 mg/dL — ABNORMAL HIGH (ref <150)

## 2021-05-08 MED ORDER — FENTANYL CITRATE (PF) 100 MCG/2ML IJ SOLN
12.5000 ug | INTRAMUSCULAR | Status: DC | PRN
Start: 2021-05-08 — End: 2021-05-09

## 2021-05-08 MED ORDER — CHLORHEXIDINE GLUCONATE CLOTH 2 % EX PADS
6.0000 | MEDICATED_PAD | Freq: Every day | CUTANEOUS | Status: DC
  Administered 2021-05-09 – 2021-06-12 (×35): 6 via TOPICAL

## 2021-05-08 MED ORDER — HEPARIN SODIUM (PORCINE) 5000 UNIT/ML IJ SOLN
5000.0000 [IU] | Freq: Three times a day (TID) | INTRAMUSCULAR | Status: DC
  Administered 2021-05-08: 5000 [IU] via SUBCUTANEOUS
  Filled 2021-05-08: qty 1

## 2021-05-08 MED ORDER — TRAVASOL 10 % IV SOLN
INTRAVENOUS | Status: AC
  Filled 2021-05-08: qty 835.2

## 2021-05-08 MED ORDER — POTASSIUM PHOSPHATES 15 MMOLE/5ML IV SOLN
15.0000 mmol | Freq: Once | INTRAVENOUS | Status: AC
  Administered 2021-05-08: 15 mmol via INTRAVENOUS
  Filled 2021-05-08: qty 5

## 2021-05-08 MED ORDER — ENOXAPARIN SODIUM 30 MG/0.3ML IJ SOSY
30.0000 mg | PREFILLED_SYRINGE | INTRAMUSCULAR | Status: DC
  Administered 2021-05-08: 30 mg via SUBCUTANEOUS
  Filled 2021-05-08: qty 0.3

## 2021-05-08 MED ORDER — POTASSIUM CHLORIDE 10 MEQ/50ML IV SOLN
10.0000 meq | INTRAVENOUS | Status: AC
  Administered 2021-05-08 (×3): 10 meq via INTRAVENOUS
  Filled 2021-05-08 (×3): qty 50

## 2021-05-08 MED ORDER — MAGNESIUM SULFATE 2 GM/50ML IV SOLN
2.0000 g | Freq: Once | INTRAVENOUS | Status: AC
  Administered 2021-05-08: 2 g via INTRAVENOUS
  Filled 2021-05-08: qty 50

## 2021-05-08 NOTE — Evaluation (Signed)
Physical Therapy Evaluation Patient Details Name: Dana Coleman MRN: [id number] DOB: 05/24/1941 Today's Date: 05/08/2021   History of Present Illness  Pt is a 80 yo female admitted on 05/04/21 for AMS. HX of cholecystectomy on 7/21.  Intubated upon admission, 7/24 Started CRRT, Esophageal perforation with septic shock post flap repair, drainage of empyema and decortication of R lung with chest tube placed.  7/25 EKG changes and probable nstemi. Extubated 7/26. PMH includes: R breast CA, DM, CAD, HTN.  Clinical Impression  Pt mentioned feeling dizzy at EOB and desat to 86% on room air (see vitals below). Pt's deficits include decreased bed mobility, transfers, balance, activity tolerance, and strength. Pt would benefit from PT to improve said deficits and function. Recommend CIR following d/c. Pt agreeable to d/c recommendations. Pre-vitals-BP 131/77(92), spO2 96 on 4L, HR 91 Vitals during session- HR 91, spO2 95% on 4 L, BP 146/70(91) Post vitals- HR89, spO2 94% on 4L, BP 137/66(88)    Follow Up Recommendations CIR    Equipment Recommendations  None recommended by PT    Recommendations for Other Services       Precautions / Restrictions Precautions Precautions: Fall;Other (comment) Precaution Comments: R chest tube Restrictions Weight Bearing Restrictions: No      Mobility  Bed Mobility Overal bed mobility: Needs Assistance Bed Mobility: Rolling;Sidelying to Sit Rolling: Max assist;+2 for physical assistance;+2 for safety/equipment Sidelying to sit: Max assist;+2 for safety/equipment;+2 for physical assistance       General bed mobility comments: cueing for technique, max assist +2 to roll and elevate trunk, scoot forward.    Transfers Overall transfer level: Needs assistance Equipment used: 2 person hand held assist Transfers: Sit to/from [facility] Sit to Stand: Max assist;+2 physical assistance;+2 safety/equipment Stand pivot transfers: Max  assist;+2 physical assistance;+2 safety/equipment       General transfer comment: 2+ assist to power up and steady using bed pad and assist to fully pivot hips to chair  Ambulation/Gait    Unable to perform            Stairs            Wheelchair Mobility    Modified Rankin (Stroke Patients Only)       Balance Overall balance assessment: Needs assistance Sitting-balance support: No upper extremity supported;Feet supported Sitting balance-Leahy Scale: Fair Sitting balance - Comments: Reported dizziness in seated position. VSS   Standing balance support: Bilateral upper extremity supported;During functional activity Standing balance-Leahy Scale: Poor Standing balance comment: relies on BUE and external support                             Pertinent Vitals/Pain Pain Assessment: Faces Faces Pain Scale: Hurts even more Pain Location: R flank Pain Descriptors / Indicators: Discomfort;Grimacing;Guarding;Operative site guarding Pain Intervention(s): Limited activity within patient's tolerance;Monitored during session;Repositioned;Premedicated before session    Home Living Family/patient expects to be discharged to:: Private residence Living Arrangements: Alone Available Help at Discharge: Family;Available PRN/intermittently (sister lives across the street) Type of Home: House Home Access: Stairs to enter Entrance Stairs-Rails: Left Entrance Stairs-Number of Steps: 3 Home Layout: One level Home Equipment: None      Prior Function Level of Independence: Independent         Comments: drives     Hand Dominance   Dominant Hand: Right    Extremity/Trunk Assessment   Upper Extremity Assessment Upper Extremity Assessment: RUE deficits/detail;LUE deficits/detail RUE Deficits / Details: Limited  AROM with decreased strength. Unable to fully assess AROM due to pain RUE: Unable to fully assess due to pain LUE Deficits / Details: Limited AROM with  decreased strength (3-/5)    Lower Extremity Assessment Lower Extremity Assessment: LLE deficits/detail;RLE deficits/detail RLE Deficits / Details: Unable to fully assess on R. Required max A to move LLE Deficits / Details: generalized weakness on L       Communication   Communication: No difficulties  Cognition Arousal/Alertness: Awake/alert Behavior During Therapy: Flat affect Overall Cognitive Status: Impaired/Different from baseline Area of Impairment: Orientation;Following commands;Awareness;Problem solving;Memory                 Orientation Level: Disoriented to;Time   Memory: Decreased short-term memory Following Commands: Follows one step commands with increased time;Follows multi-step commands inconsistently   Awareness: Emergent Problem Solving: Slow processing;Decreased initiation;Requires verbal cues;Difficulty sequencing;Requires tactile cues General Comments: Pt A&Ox3, with disorientation to time. Unable to recall month during session after being reoriented. Follows commands with increased time      General Comments General comments (skin integrity, edema, etc.): VSS on 4L O2. Desat on room air to 85% seated EOB    Exercises     Assessment/Plan    PT Assessment Patient needs continued PT services  PT Problem List Decreased strength;Decreased mobility;Decreased activity tolerance;Decreased balance;Decreased range of motion;Decreased cognition       PT Treatment Interventions Stair training;Gait training;Therapeutic activities    PT Goals (Current goals can be found in the Care Plan section)  Acute Rehab PT Goals Patient Stated Goal: get back to housework PT Goal Formulation: With patient Time For Goal Achievement: 05/21/21 Potential to Achieve Goals: Fair    Frequency Min 3X/week   Barriers to discharge        Co-evaluation PT/OT/SLP Co-Evaluation/Treatment: Yes Reason for Co-Treatment: Complexity of the patient's impairments (multi-system  involvement) PT goals addressed during session: Mobility/safety with mobility OT goals addressed during session: ADL's and self-care       AM-PAC PT "6 Clicks" Mobility  Outcome Measure Help needed turning from your back to your side while in a flat bed without using bedrails?: Total Help needed moving from lying on your back to sitting on the side of a flat bed without using bedrails?: Total Help needed moving to and from a bed to a chair (including a wheelchair)?: Total Help needed standing up from a chair using your arms (e.g., wheelchair or bedside chair)?: Total Help needed to walk in hospital room?: Total Help needed climbing 3-5 steps with a railing? : Total 6 Click Score: 6    End of Session   Activity Tolerance: Patient limited by pain;Patient limited by fatigue Patient left: in chair;with chair alarm set;with call bell/phone within reach Nurse Communication: Mobility status PT Visit Diagnosis: Muscle weakness (generalized) (M62.81);Unsteadiness on feet (R26.81)    Time:  -      Charges:              Louie Casa, SPT Acute Rehab: [phone]   Domingo Dimes 05/08/2021, 2:53 PM

## 2021-05-08 NOTE — Evaluation (Signed)
Occupational Therapy Evaluation Patient Details Name: Dana Coleman MRN: [id number] DOB: 07-24-1941 Today's Date: 05/08/2021    History of Present Illness Pt is a 80 yo female admitted on 05/04/21 for AMS. HX of cholecystectomy on 7/21.  Intubated upon admission, 7/24 Started CRRT, Esophageal perforation with septic shock post flap repair, drainage of empyema and decortication of R lung with chest tube placed.  7/25 EKG changes and probable nstemi. Extubated 7/26. PMH includes: R breast CA, DM, CAD, HTN.   Clinical Impression   PTA patient reports independent and driving. Admitted for above and presenting with problem list below, including generalized weakness, decreased activity tolerance, impaired balance, and pain.  Requires max assist +2 for bed mobility and transfers, up to total assist for ADLs.  She is able to follow commands given increased time, disoriented to time with poor recall of month during session.  3She will benefit from continued OT services while admitted and after dc at CIR level to optimize independence and return to PLOF. Will follow acutely.     Follow Up Recommendations  CIR;Supervision/Assistance - 24 hour    Equipment Recommendations  3 in 1 bedside commode    Recommendations for Other Services Rehab consult     Precautions / Restrictions Precautions Precautions: Fall;Other (comment) Precaution Comments: R chest tube Restrictions Weight Bearing Restrictions: No      Mobility Bed Mobility Overal bed mobility: Needs Assistance Bed Mobility: Rolling;Sidelying to Sit Rolling: Max assist;+2 for physical assistance;+2 for safety/equipment Sidelying to sit: Max assist;+2 for safety/equipment;+2 for physical assistance       General bed mobility comments: cueing for technique, max assist +2 to roll and elevate trunk, scoot forward.    Transfers Overall transfer level: Needs assistance Equipment used: 2 person hand held assist Transfers: Sit to/from  [facility] Sit to Stand: Max assist;+2 physical assistance;+2 safety/equipment Stand pivot transfers: Max assist;+2 physical assistance;+2 safety/equipment       General transfer comment: 2+ assist to power up and steady using bed pad and assist to fully pivot hips to chair    Balance Overall balance assessment: Needs assistance Sitting-balance support: No upper extremity supported;Feet supported Sitting balance-Leahy Scale: Fair     Standing balance support: Bilateral upper extremity supported;During functional activity Standing balance-Leahy Scale: Poor Standing balance comment: relies on BUE and external support                           ADL either performed or assessed with clinical judgement   ADL Overall ADL's : Needs assistance/impaired     Grooming: Sitting;Moderate assistance           Upper Body Dressing : Moderate assistance;Sitting   Lower Body Dressing: Total assistance;+2 for safety/equipment;+2 for physical assistance;Sit to/from stand   Toilet Transfer: Maximal assistance;+2 for safety/equipment;+2 for physical assistance;Stand-pivot Toilet Transfer Details (indicate cue type and reason): simulated to recliner         Functional mobility during ADLs: Maximal assistance;+2 for safety/equipment;+2 for physical assistance;Cueing for safety;Cueing for sequencing General ADL Comments: pt limited by weakness, decreased activity tolerance, impaired balance     Vision   Vision Assessment?: No apparent visual deficits     Perception     Praxis      Pertinent Vitals/Pain Pain Assessment: Faces Faces Pain Scale: Hurts even more Pain Location: R flank Pain Descriptors / Indicators: Discomfort;Grimacing;Guarding;Operative site guarding Pain Intervention(s): Limited activity within patient's tolerance;Monitored during session;Repositioned;Premedicated before session  Hand Dominance Right   Extremity/Trunk Assessment Upper  Extremity Assessment Upper Extremity Assessment: Generalized weakness (edema > in R UE)   Lower Extremity Assessment Lower Extremity Assessment: Defer to PT evaluation       Communication Communication Communication: No difficulties   Cognition Arousal/Alertness: Awake/alert Behavior During Therapy: Flat affect Overall Cognitive Status: Impaired/Different from baseline Area of Impairment: Orientation;Following commands;Awareness;Problem solving;Memory                 Orientation Level: Disoriented to;Time   Memory: Decreased short-term memory Following Commands: Follows one step commands with increased time;Follows multi-step commands inconsistently   Awareness: Emergent Problem Solving: Slow processing;Decreased initiation;Requires verbal cues;Difficulty sequencing;Requires tactile cues General Comments: pt disoriented to time (reports march) then reoriented and unable to recall month during session, follows simple commands with increased time but decreased awareness to safety and deficits   General Comments  VSS on 4L O2, on RA desaturate to 85%    Exercises     Shoulder Instructions      Home Living Family/patient expects to be discharged to:: Private residence Living Arrangements: Alone Available Help at Discharge: Family;Available PRN/intermittently (sister lives across the street) Type of Home: House Home Access: Stairs to enter Technical brewer of Steps: 3 Entrance Stairs-Rails: Left Home Layout: One level     Bathroom Shower/Tub: Occupational psychologist: Standard     Home Equipment: None          Prior Functioning/Environment Level of Independence: Independent        Comments: drives        OT Problem List: Decreased strength;Impaired balance (sitting and/or standing);Decreased activity tolerance;Decreased cognition;Decreased range of motion;Decreased safety awareness;Decreased knowledge of use of DME or AE;Decreased  knowledge of precautions;Cardiopulmonary status limiting activity;Pain      OT Treatment/Interventions: Self-care/ADL training;DME and/or AE instruction;Therapeutic activities;Cognitive remediation/compensation;Patient/family education;Balance training;Therapeutic exercise;Energy conservation    OT Goals(Current goals can be found in the care plan section) Acute Rehab OT Goals Patient Stated Goal: get back to housework OT Goal Formulation: With patient Time For Goal Achievement: 05/22/21 Potential to Achieve Goals: Good  OT Frequency: Min 2X/week   Barriers to D/C:            Co-evaluation PT/OT/SLP Co-Evaluation/Treatment: Yes Reason for Co-Treatment: Complexity of the patient's impairments (multi-system involvement);To address functional/ADL transfers;For patient/therapist safety   OT goals addressed during session: ADL's and self-care      AM-PAC OT "6 Clicks" Daily Activity     Outcome Measure Help from another person eating meals?: Total Help from another person taking care of personal grooming?: A Lot Help from another person toileting, which includes using toliet, bedpan, or urinal?: A Lot Help from another person bathing (including washing, rinsing, drying)?: A Lot Help from another person to put on and taking off regular upper body clothing?: A Lot Help from another person to put on and taking off regular lower body clothing?: Total 6 Click Score: 10   End of Session Equipment Utilized During Treatment: Oxygen Nurse Communication: Mobility status;Need for lift equipment  Activity Tolerance: Patient tolerated treatment well Patient left: in chair;with call bell/phone within reach;with chair alarm set  OT Visit Diagnosis: Other abnormalities of gait and mobility (R26.89);Muscle weakness (generalized) (M62.81);Pain;Other symptoms and signs involving cognitive function Pain - Right/Left: Right Pain - part of body:  (flank)                Time: [id number] OT Time  Calculation (min): 35 min Charges:  OT  General Charges $OT Visit: 1 Visit OT Evaluation $OT Eval Moderate Complexity: 1 Mod OT Treatments $Self Care/Home Management : 8-22 mins  [name], OT Acute Rehabilitation Services Pager [phone] Office [phone]   [name] 05/08/2021, 1:51 PM

## 2021-05-08 NOTE — Progress Notes (Signed)
   NAME:  Dana Coleman, MRN:  [id number], DOB:  07-17-1941, LOS: 4 ADMISSION DATE:  05/04/2021, CONSULTATION DATE:  05/04/21 REFERRING MD:  Ralene Bathe, CHIEF COMPLAINT:  SOB  History of Present Illness:  Post op GB surgery Worsened abd post post Brought in by EMS, began becoming brady, hypoxemic to 70s, intubated CXR with large loculated R effusion CTA ordered Labs pending PCCM consulted for admission Patient currently intubated and sedated  Pertinent  Medical History  Prior R breast cancer DM CAD HLD HTN  Significant Hospital Events: Including procedures, antibiotic start and stop dates in addition to other pertinent events   7/23 admitted intubated. Pigtail by [facility] in ED -- interestingly seems to have bilious outpt  7/24 Starting CRRT. Abundant GPCs in pleural fluid 7/24 1.  Right thoracotomy 2.  Repair of esophageal perforation with intercostal pedicled muscle flap. 3.  Drainage of empyema and decortication of right lung 7/25 early AM EKG changes, trop leak, probable inferiror NSTEMI, not candidate for intervention.  Echo with mildly reduced LV, large RV ?cause 7/26 extubated  Interim History / Subjective:  Weak Off pressors TPN started  Objective   Blood pressure (!) 148/71, pulse 85, temperature 98.96 F (37.2 C), resp. rate (!) 22, height '5\' 1"'$  (1.549 m), weight 58 kg, SpO2 97 %.    FiO2 (%):  [36 %] 36 %   Intake/Output Summary (Last 24 hours) at 05/08/2021 [id number] Last data filed at 05/08/2021 0500 Gross per 24 hour  Intake 2359.96 ml  Output 1245 ml  Net 1114.96 ml    Filed Weights   05/04/21 1625  Weight: 58 kg    Examination: Frail elderly woman in NAD Lungs diminished on R, shallow inspiratory efforts Ext warm Weak cough, weak voice Chest tube without tidaling/air leak Moves all 4 ext  Resolved Hospital Problem list     Assessment & Plan:  Esophageal perforation with septic shock post flap repair 7/24 Acute hypoxemic respiratory failure from  strep/yeast empyema from perforation post decortication 7/24 Septic ATN improving Post op lap chole 7/21 NSTEMI with WMA on echo not candidate for intervention at present Dilated RV with reduced EF- question related to NSTEMI vs. Stress response to intrapulmonary pathology DM2 with variable sugars- TPN bag insulin being adjusted Afib slow VR- resolved after extbation Congestive hepatopathy- slightly worse monitor, thrombocytopenia likely related to this as well   - Drain management  per TCTS - Eraxis and unasyn 7 days postop - IS, OOB, PT/OT consult - Still has a long way to go, keeping strength up and pulmonary toileting will be important - Will discuss when we can resume AC with TCTS  Best Practice (right click and "Reselect all SmartList Selections" daily)   Diet/type: TPN DVT prophylaxis: SCD, chemical when cleared by TCTS GI prophylaxis: N/A Lines: out Foley:  remove today, purewick Code Status:  full code Last date of multidisciplinary goals of care discussion- full recovery hoped for   Erskine Emery MD [facility]  Prefer epic messenger for cross cover needs If after hours, please call E-link

## 2021-05-08 NOTE — Progress Notes (Addendum)
3 Days Post-Op Procedure(s) (LRB): THORACOTOMY MAJOR REPAIR PERFORATED ESOPHAGUS (Right) Subjective: No complaints but she is lethargic.  She reportedly became very sleepy last night after fentanyl for pain and dose decreased.  Objective: Vital signs in last 24 hours: Temp:  [98.96 F (37.2 C)-99.68 F (37.6 C)] 98.96 F (37.2 C) (07/27 0645) Pulse Rate:  [53-95] 85 (07/27 0645) Cardiac Rhythm: Atrial fibrillation;Sinus bradycardia (07/26 2000) Resp:  [6-30] 22 (07/27 0645) BP: (85-148)/(50-79) 148/71 (07/27 0645) SpO2:  [87 %-100 %] 97 % (07/27 0645) Arterial Line BP: (108-153)/(48-62) 152/57 (07/26 1400) FiO2 (%):  [36 %] 36 % (07/26 1006)  Hemodynamic parameters for last 24 hours:    Intake/Output from previous day: 07/26 0701 - 07/27 0700 In: 2437.9 [I.V.:1953.3; IV Piggyback:484.6] Out: 1245 [Urine:1045; Chest Tube:200] Intake/Output this shift: Total I/O In: -  Out: 270 [Urine:250; Chest Tube:20]  General appearance: slowed mentation Neurologic: intact Heart: regular rate and rhythm and frequent premature beats Lungs: clear to auscultation bilaterally Abdomen: soft, non-tender; bowel sounds hypoactive Extremities: moderate anasarca Wound: dressing dry Chest tube output low, serosanguinous.  Lab Results: Recent Labs    05/08/21 0345 05/08/21 0456  WBC 10.6* 11.6*  HGB 9.0* 9.5*  HCT 26.9* 28.4*  PLT 66* 75*   BMET:  Recent Labs    05/08/21 0345 05/08/21 0456  NA 135 140  K 4.7 3.5  CL 101 105  CO2 26 28  GLUCOSE 735* 207*  BUN 30* 30*  CREATININE 1.28* 1.25*  CALCIUM 8.4* 8.4*    PT/INR: No results for input(s): LABPROT, INR in the last 72 hours. ABG    Component Value Date/Time   PHART 7.415 05/06/2021 0113   HCO3 27.8 05/07/2021 0516   TCO2 29 05/07/2021 0516   ACIDBASEDEF 14.0 (H) 05/04/2021 2049   O2SAT 96.0 05/07/2021 0516   CBG (last 3)  Recent Labs    05/08/21 0344 05/08/21 0346 05/08/21 0811  GLUCAP 533* 215* 180*   CXR:  looks good   Assessment/Plan: S/P Procedure(s) (LRB): THORACOTOMY MAJOR REPAIR PERFORATED ESOPHAGUS (Right)  Hemodynamically stable off pressors.  Rhythm is now sinus with PAC's. Has had some atrial fib postop. Given her advanced age, recent major surgery, raw pleural surface, thrombocytopenia I would rather avoid IV heparin at this time because risk of bleeding is higher than risk of stroke. Can start Lovenox for DVT prophylaxis. Discussed with pharmacy.  Keep NPO with TNA for nutrition. She has severe malnutrition with prealbumin of 6.1. Plan GG followed thin barium swallow 7 days following her repair.  IS, OOB, mobilize.  Keep all chest tubes in.  Strep and candida from pleural fluid cultures as expected. Continue antibiotics.  I think she needs to stay in ICU for now.   LOS: 4 days    Gaye Pollack 05/08/2021

## 2021-05-08 NOTE — Progress Notes (Signed)
PHARMACY - TOTAL PARENTERAL NUTRITION CONSULT NOTE   Indication: s/p esophageal perforation and repair  Patient Measurements: Height: '5\' 1"'$  (154.9 cm) Weight: 58 kg (127 lb 13.9 oz) IBW/kg (Calculated) : 47.8   Body mass index is 24.16 kg/m. Usual Weight:   Assessment: 80 y/o F admitted 7/23 now s/p gall bladder surgery and esophageal perforation-repaired 7/24.  - PMH: HTN, HLD, DM, R breast cancer, CAD  Glucose / Insulin: DM. A1c 7.4. (Lantus 24 units daily PTA). SSI 22 units/24h. CBGs 110-215, rising with start of TPN which is providing ~144g of dextrose.  Electrolytes: K+ 3.5. CoCa 9.7. Phos 2.4 low. Mg 2.3  Renal: Septic ATN s/p CRRT (end 7/25). Scr 1.25 down (baseline Scr 0.88).   Hepatic: AST/ALT trend down (140/188), Tbiili 0.7 ok, Prealbumin 6.1, TG 180  Intake / Output; MIVF:  UOP 0.8 mL/kg/hr. Chest tube 200  mL. Net +2.9L - D5NS 126m/hr  Cards: 7/25 NSTEMI. No intervention yet. Afib. Off pressors.   GI meds: IV PPI/hs  GI Imaging: - 7/23 CT: No liver abnormality. Pncreas unremarkable. Small spleen. BL renal masses. Normal stomach/bowels.   GI Surgeries / Procedures:  - 7/21: S/P laparoscopic cholecystectomy and LOA - 7/24: S/P R thoracotomy, repair of esophageal perforation with muscle flap, drainage of empyema and decortication of R lung  Central access: PICC 7/26 (double lumen) TPN start date: 7/26  Nutritional Goals (per RD recommendation on 7/26): kCal: 1750-2030 , Protein: 87-116 , Fluid:  >=1.8L Goal TPN rate is 80 mL/hr (provides 111 g of protein and 1808 kcals per day)  Current Nutrition:  NPO  Plan:  Increase TPN to 60 mL/hr at 1800 - Monitor CBGs and lytes and if controlled, increase to goal tomorrow. This will provide 84g protein, 172g dextrose, and 1350 kcals per day providing 75% of goals.  Electrolytes in TPN: Na 559m/L, K 25102mL, Ca 5mE60m, Mg 0mEq36m and Phos 20mmo64m Cl:Ac 1:1 based on residual AKI. Add standard MVI and trace  elements to TPN Continue CVTS q4h SSI and adjust as needed  Add 15 units of insulin to TPN and monitor control. Stopped LR per CCM.  Monitor TPN labs on Mon/Thurs, and PRN.  KPhos 15 mmol IV x1 per protocol. KCl 10mEq 21m3 per MD.  JessicaSloan LeiterD, BCPS, BCCCP Clinical Pharmacist Please refer to AMION [facility] 05/08/2021,7:10 AM

## 2021-05-08 NOTE — Progress Notes (Signed)
Rehab Admissions Coordinator Note:  Patient was screened by Cleatrice Burke for appropriateness for an Inpatient Acute Rehab Consult.  At this time, we are recommending Inpatient Rehab consult. I will place order per protocol.  Cleatrice Burke RN MSN 05/08/2021, 8:29 PM  I can be reached at [phone].

## 2021-05-09 DIAGNOSIS — J9601 Acute respiratory failure with hypoxia: Secondary | ICD-10-CM | POA: Diagnosis not present

## 2021-05-09 DIAGNOSIS — N179 Acute kidney failure, unspecified: Secondary | ICD-10-CM | POA: Diagnosis not present

## 2021-05-09 LAB — TYPE AND SCREEN
ABO/RH(D): A POS
Antibody Screen: NEGATIVE
Unit division: 0
Unit division: 0

## 2021-05-09 LAB — GLUCOSE, CAPILLARY
Glucose-Capillary: 176 mg/dL — ABNORMAL HIGH (ref 70–99)
Glucose-Capillary: 189 mg/dL — ABNORMAL HIGH (ref 70–99)
Glucose-Capillary: 194 mg/dL — ABNORMAL HIGH (ref 70–99)
Glucose-Capillary: 205 mg/dL — ABNORMAL HIGH (ref 70–99)

## 2021-05-09 LAB — COMPREHENSIVE METABOLIC PANEL
ALT: 138 U/L — ABNORMAL HIGH (ref 0–44)
AST: 89 U/L — ABNORMAL HIGH (ref 15–41)
Albumin: 1.9 g/dL — ABNORMAL LOW (ref 3.5–5.0)
Alkaline Phosphatase: 84 U/L (ref 38–126)
Anion gap: 7 (ref 5–15)
BUN: 24 mg/dL — ABNORMAL HIGH (ref 8–23)
CO2: 26 mmol/L (ref 22–32)
Calcium: 8.9 mg/dL (ref 8.9–10.3)
Chloride: 108 mmol/L (ref 98–111)
Creatinine, Ser: 0.95 mg/dL (ref 0.44–1.00)
GFR, Estimated: >60 mL/min (ref >60)
Glucose, Bld: 197 mg/dL — ABNORMAL HIGH (ref 70–99)
Potassium: 3.5 mmol/L (ref 3.5–5.1)
Sodium: 141 mmol/L (ref 135–145)
Total Bilirubin: 0.6 mg/dL (ref 0.3–1.2)
Total Protein: 5.3 g/dL — ABNORMAL LOW (ref 6.5–8.1)

## 2021-05-09 LAB — BPAM RBC
Blood Product Expiration Date: 202208222359
Blood Product Expiration Date: 202208222359
Unit Type and Rh: 6200
Unit Type and Rh: 6200

## 2021-05-09 LAB — CBC
HCT: 29.8 % — ABNORMAL LOW (ref 36.0–46.0)
Hemoglobin: 9.8 g/dL — ABNORMAL LOW (ref 12.0–15.0)
MCH: 29.3 pg (ref 26.0–34.0)
MCHC: 32.9 g/dL (ref 30.0–36.0)
MCV: 89.2 fL (ref 80.0–100.0)
Platelets: 70 10*3/uL — ABNORMAL LOW (ref 150–400)
RBC: 3.34 MIL/uL — ABNORMAL LOW (ref 3.87–5.11)
RDW: 15.2 % (ref 11.5–15.5)
WBC: 13.6 10*3/uL — ABNORMAL HIGH (ref 4.0–10.5)
nRBC: 0.6 % — ABNORMAL HIGH (ref 0.0–0.2)

## 2021-05-09 LAB — MAGNESIUM: Magnesium: 2 mg/dL (ref 1.7–2.4)

## 2021-05-09 LAB — HEPARIN LEVEL (UNFRACTIONATED): Heparin Unfractionated: 0.12 IU/mL — ABNORMAL LOW (ref 0.30–0.70)

## 2021-05-09 LAB — PHOSPHORUS: Phosphorus: 2.8 mg/dL (ref 2.5–4.6)

## 2021-05-09 MED ORDER — HEPARIN (PORCINE) 25000 UT/250ML-% IV SOLN
1200.0000 [IU]/h | INTRAVENOUS | Status: DC
  Administered 2021-05-09: 850 [IU]/h via INTRAVENOUS
  Administered 2021-05-10 – 2021-05-12 (×3): 1200 [IU]/h via INTRAVENOUS
  Administered 2021-05-13: 1150 [IU]/h via INTRAVENOUS
  Filled 2021-05-09 (×5): qty 250

## 2021-05-09 MED ORDER — ACETAMINOPHEN 10 MG/ML IV SOLN
1000.0000 mg | Freq: Four times a day (QID) | INTRAVENOUS | Status: AC | PRN
  Administered 2021-05-09 (×3): 1000 mg via INTRAVENOUS
  Filled 2021-05-09 (×4): qty 100

## 2021-05-09 MED ORDER — POTASSIUM PHOSPHATES 15 MMOLE/5ML IV SOLN
10.0000 mmol | Freq: Once | INTRAVENOUS | Status: AC
  Administered 2021-05-09: 10 mmol via INTRAVENOUS
  Filled 2021-05-09: qty 3.33

## 2021-05-09 MED ORDER — CHLORHEXIDINE GLUCONATE 0.12 % MT SOLN
15.0000 mL | Freq: Two times a day (BID) | OROMUCOSAL | Status: DC
  Administered 2021-05-09 – 2021-06-11 (×47): 15 mL via OROMUCOSAL
  Filled 2021-05-09 (×39): qty 15

## 2021-05-09 MED ORDER — POTASSIUM CHLORIDE 10 MEQ/50ML IV SOLN
10.0000 meq | INTRAVENOUS | Status: AC
  Administered 2021-05-09 (×4): 10 meq via INTRAVENOUS
  Filled 2021-05-09 (×4): qty 50

## 2021-05-09 MED ORDER — METOPROLOL TARTRATE 5 MG/5ML IV SOLN
2.5000 mg | Freq: Four times a day (QID) | INTRAVENOUS | Status: DC
  Administered 2021-05-09 – 2021-05-19 (×41): 2.5 mg via INTRAVENOUS
  Filled 2021-05-09 (×39): qty 5

## 2021-05-09 MED ORDER — TRAVASOL 10 % IV SOLN
INTRAVENOUS | Status: AC
  Filled 2021-05-09: qty 1113.6

## 2021-05-09 MED ORDER — SODIUM CHLORIDE 0.9 % IV SOLN
3.0000 g | Freq: Three times a day (TID) | INTRAVENOUS | Status: DC
  Administered 2021-05-09 – 2021-05-12 (×9): 3 g via INTRAVENOUS
  Filled 2021-05-09: qty 3
  Filled 2021-05-09 (×2): qty 8
  Filled 2021-05-09: qty 3
  Filled 2021-05-09: qty 8
  Filled 2021-05-09: qty 3
  Filled 2021-05-09 (×2): qty 8
  Filled 2021-05-09: qty 3
  Filled 2021-05-09 (×2): qty 8

## 2021-05-09 MED ORDER — ORAL CARE MOUTH RINSE
15.0000 mL | Freq: Two times a day (BID) | OROMUCOSAL | Status: DC
Start: 2021-05-10 — End: 2021-06-11
  Administered 2021-05-10 – 2021-06-10 (×39): 15 mL via OROMUCOSAL

## 2021-05-09 MED ORDER — FENTANYL CITRATE (PF) 100 MCG/2ML IJ SOLN: 25.0000 ug | INTRAMUSCULAR | Status: DC | PRN

## 2021-05-09 NOTE — Progress Notes (Signed)
4 Days Post-Op Procedure(s) (LRB): THORACOTOMY MAJOR REPAIR PERFORATED ESOPHAGUS (Right) Subjective:  Complains of pain. Has had tylenol overnight for pain and no fentanyl and is much more alert this am. Has been able to work on pulmonary toilet with nurse.  Monitor reviewed. She had periods of sinus with 1st degree AV block and periods of atrial fib. Rate mostly controlled but gets up in the 120's with activity in bed.  Objective: Vital signs in last 24 hours: Temp:  [98.7 F (37.1 C)-99.5 F (37.5 C)] 99.4 F (37.4 C) (07/28 0400) Pulse Rate:  [68-110] 105 (07/28 0700) Cardiac Rhythm: Heart block (07/27 2000) Resp:  [16-32] 31 (07/28 0700) BP: (119-155)/(61-88) 146/87 (07/28 0600) SpO2:  [94 %-100 %] 94 % (07/28 0700)  Hemodynamic parameters for last 24 hours:    Intake/Output from previous day: 07/27 0701 - 07/28 0700 In: 2346.9 [I.V.:1481.7; IV Piggyback:865.2] Out: 3120 [Urine:3050; Chest Tube:70] Intake/Output this shift: No intake/output data recorded.  General appearance: alert and cooperative Neurologic: intact Heart: irregularly irregular rhythm Lungs: rhonchi bilaterally Abdomen: soft, non-tender; few bowel sounds  Extremities: edema moderate Wound: dressing changed by nurse this am and looks good by her report  Lab Results: Recent Labs    05/08/21 0456 05/09/21 0405  WBC 11.6* 13.6*  HGB 9.5* 9.8*  HCT 28.4* 29.8*  PLT 75* 70*   BMET:  Recent Labs    05/08/21 1530 05/09/21 0405  NA 140 141  K 4.0 3.5  CL 108 108  CO2 29 26  GLUCOSE 231* 197*  BUN 27* 24*  CREATININE 1.04* 0.95  CALCIUM 8.7* 8.9    PT/INR: No results for input(s): LABPROT, INR in the last 72 hours. ABG    Component Value Date/Time   PHART 7.415 05/06/2021 0113   HCO3 27.8 05/07/2021 0516   TCO2 29 05/07/2021 0516   ACIDBASEDEF 14.0 (H) 05/04/2021 2049   O2SAT 96.0 05/07/2021 0516   CBG (last 3)  Recent Labs    05/08/21 2011 05/09/21 0008 05/09/21 0406  GLUCAP  200* 176* 194*    Assessment/Plan: S/P Procedure(s) (LRB): THORACOTOMY MAJOR REPAIR PERFORATED ESOPHAGUS (Right)  POD 4  Hemodynamically stable off pressors.  Rhythm is sinus with periods of atrial fib. Will start low dose scheduled Lopressor IV and observe. Should be ok to start heparin IV now for AF.  Preop acute renal failure resolved.  Mediastinitis and right empyema due to esophageal perforation. Continue IV antibiotics and NPO. Plan esophagram Monday. TNA for nutrition.  Continue chest tubes to suction.  OOB to chair. Mobilize as tolerated. IS.  Started IV tylenol for pain which will be more effective than rectal and DC fentanyl.   LOS: 5 days    Gaye Pollack 05/09/2021

## 2021-05-09 NOTE — Progress Notes (Signed)
   NAME:  Dana Coleman, MRN:  [id number], DOB:  [birth year], LOS: 5 ADMISSION DATE:  05/04/2021, CONSULTATION DATE:  05/04/21 REFERRING MD:  Ralene Bathe, CHIEF COMPLAINT:  SOB  History of Present Illness:  Post op GB surgery Worsened abd post post Brought in by EMS, began becoming brady, hypoxemic to 70s, intubated CXR with large loculated R effusion CTA ordered Labs pending PCCM consulted for admission Patient currently intubated and sedated  Pertinent  Medical History  Prior R breast cancer DM CAD HLD HTN  Significant Hospital Events: Including procedures, antibiotic start and stop dates in addition to other pertinent events   7/23 admitted intubated. Pigtail by CCS in ED -- interestingly seems to have bilious outpt  7/24 Starting CRRT. Abundant GPCs in pleural fluid 7/24 1.  Right thoracotomy 2.  Repair of esophageal perforation with intercostal pedicled muscle flap. 3.  Drainage of empyema and decortication of right lung 7/25 early AM EKG changes, trop leak, probable inferiror NSTEMI, not candidate for intervention.  Echo with mildly reduced LV, large RV ?cause 7/26 extubated  Interim History / Subjective:  No events. More awake. Lots of pain. Breathing okay.  Objective   Blood pressure (!) 146/87, pulse (!) 105, temperature 97.6 F (36.4 C), temperature source Axillary, resp. rate (!) 31, height '5\' 1"'$  (1.549 m), weight 58 kg, SpO2 94 %.        Intake/Output Summary (Last 24 hours) at 05/09/2021 [id number] Last data filed at 05/09/2021 0700 Gross per 24 hour  Intake 2091.07 ml  Output 3180 ml  Net -1088.93 ml    Filed Weights   05/04/21 1625  Weight: 58 kg    Examination: Frail elderly woman in NAD Lungs diminished on R, better inspiratory efforts Ext warm Stronger voice and cough today Chest tube without tidaling/air leak Moves all 4 ext  LFTs improved  Resolved Hospital Problem list     Assessment & Plan:  Esophageal perforation with septic shock post flap  repair 7/24 Acute hypoxemic respiratory failure from strep/yeast empyema from perforation post decortication 7/24 Septic ATN improving Post op lap chole 7/21 NSTEMI with WMA on echo not candidate for intervention at present Dilated RV with reduced EF- question related to NSTEMI vs. Stress response to intrapulmonary pathology DM2 with variable sugars- TPN bag insulin being adjusted Afib slow VR- resolved after extbation Congestive hepatopathy- slightly worse monitor, thrombocytopenia likely related to this as well   - Drain management  per TCTS - Continue TPN - Eraxis and unasyn 7 days postop - IS, OOB, PT/OT - IV tylenol per TCTS instead of entanyl - Still has a long way to go, keeping strength up and pulmonary toileting will be important - Started on heparin subQ, at some point needs to be back on full [facility]  Best Practice (right click and "Reselect all SmartList Selections" daily)   Diet/type: TPN DVT prophylaxis: heparin subQ GI prophylaxis: N/A Lines: out Code Status:  full code Last date of multidisciplinary goals of care discussion- sister and brother updated 7/27; full recovery hoped for   Erskine Emery MD [facility]  Prefer epic messenger for cross cover needs If after hours, please call E-link

## 2021-05-09 NOTE — Progress Notes (Signed)
[facility] ADULT ICU REPLACEMENT PROTOCOL   The patient does apply for the [facility] Adult ICU Electrolyte Replacment Protocol based on the criteria listed below:   1.Exclusion criteria: TCTS patients, ECMO patients and Hypothermia Protocol, and   Dialysis patients 2. Is GFR >/= 30 ml/min? Yes.    Patient's GFR today is >60 3. Is SCr </= 2? No. Patient's SCr is 0.95 mg/dL 4. Did SCr increase >/= 0.5 in 24 hours? No. 5.Pt's weight >40kg  Yes.   6. Abnormal electrolyte(s): K+ 3.5  7. Electrolytes replaced per protocol 8.  Call MD STAT for K+ </= 2.5, Phos </= 1, or Mag </= 1 Physician:  n/a   Dana Coleman 05/09/2021 5:51 AMl

## 2021-05-09 NOTE — Progress Notes (Signed)
IP rehab admissions - I met with patient and her daughter, Dana Coleman.  Patient says to call her sister Dana Coleman to assist with DC planning.  I will contact sister to discuss potential for inpatient rehab admission.  We will need insurance authorization prior to rehab admission.  I will follow up again tomorrow after I speak with patient's sister.  Call for questions.  [phone]

## 2021-05-09 NOTE — Progress Notes (Signed)
eLink Physician-Brief Progress Note Patient Name: Dana Coleman DOB: 1941/06/25 MRN: [id number]   Date of Service  05/09/2021  HPI/Events of Note  Nursing reports AFIB with ventricular response = 73.   eICU Interventions  Plan: 12 Lead EKG STAT. BMP and Mg++ level STAT,     Intervention Category Major Interventions: Arrhythmia - evaluation and management  Keondra Haydu Eugene 05/09/2021, 10:15 PM

## 2021-05-09 NOTE — Progress Notes (Signed)
PHARMACY - TOTAL PARENTERAL NUTRITION CONSULT NOTE   Indication: s/p esophageal perforation and repair  Patient Measurements: Height: '5\' 1"'$  (154.9 cm) Weight: 58 kg (127 lb 13.9 oz) IBW/kg (Calculated) : 47.8   Body mass index is 24.16 kg/m. Usual Weight:   Assessment: 80 y/o female with recent lap chole for symptomatic cholelithiasis on 7/21. PMH including HTN, HLD, DM, R breast cancer, and CAD. Patient presented on 7/23 with AMS and unresponsiveness - patient was complaining of abdominal discomfort and then patient became unresponsive and slumped on table. S/p gall bladder surgery and esophageal perforation-repaired 7/24.  Plan for esophagram on 8/1.  Glucose / Insulin: DM. A1c 7.4. (Lantus 24 units daily PTA). Utilized 28 units SSI in 24 hrs + 15 units insulin in TPN. CBGs 194-231.  Electrolytes: Na 141. K 3.5 (s/p KCl 10 mEq IV x3 + K phos 15 mmol), Cl 108, CoCa 10, phos 2.8 (s/p 15 mmol K phos), Mg 2.0  Renal: Septic ATN s/p CRRT (end 7/25). Scr 0.95 - decrease (baseline Scr 0.88). BUN 24 - decrease.  Hepatic: AST/ALT trend down (89/138), Tbiili 0.6, Albumin 1.9, Prealbumin 6.1, TG 180 Intake / Output; MIVF:  UOP 2.2 mL/kg/hr. Chest tube 130 mL. Net +2.2L GI Imaging: - 7/23 CT: No liver abnormality. Pancreas unremarkable. Small spleen. BL renal masses. Normal stomach/bowels.   GI Surgeries / Procedures:  - 7/21: S/P laparoscopic cholecystectomy and LOA - 7/24: S/P R thoracotomy, repair of esophageal perforation with muscle flap, drainage of empyema and decortication of R lung  Central access: PICC 7/26 (double lumen) TPN start date: 7/26  Nutritional Goals (per RD recommendation on 7/26): kCal: 1750-2030 , Protein: 87-116 , Fluid:  >=1.8L Goal TPN rate is 80 mL/hr (provides 111 g of protein and 1808 kcals per day)  Current Nutrition:  NPO  Plan:  Increase TPN to goal rate of 80 mL/hr at 1800 but keep ~172g of dextrose due to hyperglycemia. This will provide 111g AA  meeting 100% protein needs and 1609 kcal meeting ~92% of kcal needs - dextrose concentration reduced from 12% to 9% with rate increase Electrolytes in TPN: Reduce Ca to 3 mEq/L. Continue Na 50 mEq/L, K 25 mEq/L (=48 mEq/day with rate increase), Mg 0 mEq/L, Phos 20 mmol/L (= 38 mmol/day with rate increase). Cl:Ac 1:1 Kcl 10 mEq x4 per MD K phos 10 mmol x1  Add standard MVI and trace elements to TPN Continue CVTS q4h SSI and adjust as needed  Increase insulin in TPN to 35 units and monitor CBGs Monitor TPN labs on Mon/Thurs, repeat electrolytes tomorrow   Cristela Felt, PharmD, BCPS Clinical Pharmacist 05/09/2021,7:02 AM

## 2021-05-09 NOTE — Progress Notes (Signed)
Pharmacy Antibiotic Note  Dana Coleman is a 80 y.o. female with esophageal perforation s/p OR on 7/24. She is on antibiotic day 6 (also on eraxis). Pleural fluide noted with streptococcus salivarius and candida glabrata.  -WBC= 13.6, SCr= 0.95  Plan: Change unasyn to 3gm IV q9h Watch renal function, cultures and clinical course.  Height: '5\' 1"'$  (154.9 cm) Weight: 58 kg (127 lb 13.9 oz) IBW/kg (Calculated) : 47.8  Temp (24hrs), Avg:99 F (37.2 C), Min:98.7 F (37.1 C), Max:99.5 F (37.5 C)  Recent Labs  Lab 05/04/21 1637 05/04/21 1644 05/04/21 1912 05/04/21 2155 05/05/21 0001 05/06/21 0401 05/07/21 0357 05/08/21 0345 05/08/21 0456 05/08/21 1530 05/09/21 0405  WBC  --   --   --   --    < > 13.4* 15.3* 10.6* 11.6*  --  13.6*  CREATININE  --    < >  --   --    < > 1.90* 1.60* 1.28* 1.25* 1.04* 0.95  LATICACIDVEN >11.0*  --  >11.0* >11.0*  --   --   --   --   --   --   --    < > = values in this interval not displayed.     Estimated Creatinine Clearance: 39.3 mL/min (by C-G formula based on SCr of 0.95 mg/dL).    No Known Allergies   Unasyn 7/26> Zosyn 7/23 > 7/26 Eraxis 7/23 >  7/24 blood x2 > ngtd 7/23 pleural fluid > sterp salivarius (pan S)  Hildred Laser, PharmD Clinical Pharmacist **Pharmacist phone directory can now be found on [email or web address] ([email or web address]).  Listed under [place].

## 2021-05-09 NOTE — Progress Notes (Signed)
ANTICOAGULATION CONSULT NOTE - Initial Consult  Pharmacy Consult for heparin Indication: atrial fibrillation  No Known Allergies  Patient Measurements: Height: '5\' 1"'$  (154.9 cm) Weight: 58 kg (127 lb 13.9 oz) IBW/kg (Calculated) : 47.8 Heparin Dosing Weight: TBW  Vital Signs: Temp: 97.6 F (36.4 C) (07/28 0818) Temp Source: Axillary (07/28 0818) BP: 146/87 (07/28 0600) Pulse Rate: 105 (07/28 0700)  Labs: Recent Labs    05/06/21 1437 05/07/21 0357 05/08/21 0345 05/08/21 0456 05/08/21 1530 05/09/21 0405  HGB  --    < > 9.0* 9.5*  --  9.8*  HCT  --    < > 26.9* 28.4*  --  29.8*  PLT  --    < > 66* 75*  --  70*  CREATININE  --    < > 1.28* 1.25* 1.04* 0.95  TROPONINIHS 9,957*  --   --   --   --   --    < > = values in this interval not displayed.    Estimated Creatinine Clearance: 39.3 mL/min (by C-G formula based on SCr of 0.95 mg/dL).   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 Post-op experienced a fib. Also has a PMH of CAD. Patient has been receiving 30 mg LMWH for DVT ppx post op. Last dose of  Lovenox 7/27 at 21:41. Hemoglobin and platelets low, but improving post op, will continue to watch. Hgb 9.0>9.5>9.8 Plts 66>75>70  No heparin bolus given LMWH ppx.   Goal of Therapy:  Heparin level goal of 0.3-0.5 given low platelets  Monitor platelets by anticoagulation protocol: Yes   Plan:  Start heparin infusion at 850 units/hr Check anti-Xa level in 6 hours and daily while on heparin Continue to monitor H&H and platelets  Samuel Mcpeek A Larita Deremer 05/09/2021,8:38 AM

## 2021-05-09 NOTE — Progress Notes (Signed)
EVENING ROUNDS NOTE :     [address]       [place] [address]             [phone]                 4 Days Post-Op Procedure(s) (LRB): THORACOTOMY MAJOR REPAIR PERFORATED ESOPHAGUS (Right)   Total Length of Stay:  LOS: 5 days  Events:   No events Looks comfortable    BP (!) 141/77   Pulse 92   Temp 98.4 F (36.9 C) (Oral)   Resp (!) 29   Ht '5\' 1"'$  (1.549 m)   Wt 58 kg   SpO2 95%   BMI 24.16 kg/m          sodium chloride     sodium chloride Stopped (05/09/21 0625)   acetaminophen 1,000 mg (05/09/21 1720)   ampicillin-sulbactam (UNASYN) IV 3 g (05/09/21 1140)   anidulafungin 78 mL/hr at 05/09/21 1400   heparin 850 Units/hr (05/09/21 1400)   norepinephrine (LEVOPHED) Adult infusion Stopped (05/07/21 0854)   potassium PHOSPHATE IVPB (in mmol) 10 mmol (05/09/21 1451)   TPN ADULT (ION) Stopped (05/09/21 1741)   TPN ADULT (ION) 80 mL/hr at 05/09/21 1741    I/O last 3 completed shifts: In: 4097 [I.V.:2986; IV Piggyback:1111] Out: [id number]; Chest Tube:200]   CBC Latest Ref Rng & Units 05/09/2021 05/08/2021 05/08/2021  WBC 4.0 - 10.5 K/uL 13.6(H) 11.6(H) 10.6(H)  Hemoglobin 12.0 - 15.0 g/dL 9.8(L) 9.5(L) 9.0(L)  Hematocrit 36.0 - 46.0 % 29.8(L) 28.4(L) 26.9(L)  Platelets 150 - 400 K/uL 70(L) 75(L) 66(L)    BMP Latest Ref Rng & Units 05/09/2021 05/08/2021 05/08/2021  Glucose 70 - 99 mg/dL 197(H) 231(H) 207(H)  BUN 8 - 23 mg/dL 24(H) 27(H) 30(H)  Creatinine 0.44 - 1.00 mg/dL 0.95 1.04(H) 1.25(H)  BUN/Creat Ratio 12 - 28 - - -  Sodium 135 - 145 mmol/L 141 140 140  Potassium 3.5 - 5.1 mmol/L 3.5 4.0 3.5  Chloride 98 - 111 mmol/L 108 108 105  CO2 22 - 32 mmol/L '26 29 28  '$ Calcium 8.9 - 10.3 mg/dL 8.9 8.7(L) 8.4(L)    ABG    Component Value Date/Time   PHART 7.415 05/06/2021 0113   PCO2ART 42.1 05/06/2021 0113   PO2ART 105 05/06/2021 0113   HCO3 27.8 05/07/2021 0516   TCO2 29 05/07/2021 0516   ACIDBASEDEF 14.0 (H) 05/04/2021 2049    O2SAT 96.0 05/07/2021 0516       Melodie Bouillon, MD 05/09/2021 5:54 PM

## 2021-05-09 NOTE — Progress Notes (Signed)
Called and updated sister.

## 2021-05-09 NOTE — Progress Notes (Signed)
ANTICOAGULATION CONSULT NOTE - Initial Consult  Pharmacy Consult for heparin Indication: atrial fibrillation  No Known Allergies  Patient Measurements: Height: '5\' 1"'$  (154.9 cm) Weight: 58 kg (127 lb 13.9 oz) IBW/kg (Calculated) : 47.8 Heparin Dosing Weight: TBW  Vital Signs: Temp: 98.4 F (36.9 C) (07/28 1500) Temp Source: Oral (07/28 1500) BP: 157/81 (07/28 1700) Pulse Rate: 105 (07/28 1700)  Labs: Recent Labs    05/08/21 0345 05/08/21 0456 05/08/21 1530 05/09/21 0405 05/09/21 1600  HGB 9.0* 9.5*  --  9.8*  --   HCT 26.9* 28.4*  --  29.8*  --   PLT 66* 75*  --  70*  --   HEPARINUNFRC  --   --   --   --  0.12*  CREATININE 1.28* 1.25* 1.04* 0.95  --      Estimated Creatinine Clearance: 39.3 mL/min (by C-G formula based on SCr of 0.95 mg/dL).   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 Post-op experienced a fib. Also has a PMH of CAD. Patient has been receiving 30 mg LMWH for DVT ppx post op. Last dose of Lovenox 7/27 pm Hemoglobin and platelets low, but improving post op, will continue to  watch. Hgb 9.0>9.5>9.8 Plts 66>75>70  Heparin drip started 850 uts/hr heparin level 0.12 < goal   Goal of Therapy:  Heparin level goal of 0.3-0.5 given low platelets  Monitor platelets by anticoagulation protocol: Yes   Plan:  Increase hepairn drip 1000 uts/hr  Daily heparin level and cbc  Monitor s/s bleeding     Bonnita Nasuti Pharm.D. CPP, BCPS Clinical Pharmacist [phone] 05/09/2021 6:11 PM

## 2021-05-10 DIAGNOSIS — Z9689 Presence of other specified functional implants: Secondary | ICD-10-CM | POA: Diagnosis not present

## 2021-05-10 DIAGNOSIS — J9601 Acute respiratory failure with hypoxia: Secondary | ICD-10-CM | POA: Diagnosis not present

## 2021-05-10 DIAGNOSIS — N179 Acute kidney failure, unspecified: Secondary | ICD-10-CM | POA: Diagnosis not present

## 2021-05-10 LAB — CBC
HCT: 26.8 % — ABNORMAL LOW (ref 36.0–46.0)
Hemoglobin: 9.2 g/dL — ABNORMAL LOW (ref 12.0–15.0)
MCH: 30.9 pg (ref 26.0–34.0)
MCHC: 34.3 g/dL (ref 30.0–36.0)
MCV: 89.9 fL (ref 80.0–100.0)
Platelets: 92 10*3/uL — ABNORMAL LOW (ref 150–400)
RBC: 2.98 MIL/uL — ABNORMAL LOW (ref 3.87–5.11)
RDW: 15.7 % — ABNORMAL HIGH (ref 11.5–15.5)
WBC: 18.6 10*3/uL — ABNORMAL HIGH (ref 4.0–10.5)
nRBC: 0.3 % — ABNORMAL HIGH (ref 0.0–0.2)

## 2021-05-10 LAB — COMPREHENSIVE METABOLIC PANEL
ALT: 92 U/L — ABNORMAL HIGH (ref 0–44)
AST: 53 U/L — ABNORMAL HIGH (ref 15–41)
Albumin: 1.7 g/dL — ABNORMAL LOW (ref 3.5–5.0)
Alkaline Phosphatase: 74 U/L (ref 38–126)
Anion gap: 7 (ref 5–15)
BUN: 25 mg/dL — ABNORMAL HIGH (ref 8–23)
CO2: 25 mmol/L (ref 22–32)
Calcium: 9 mg/dL (ref 8.9–10.3)
Chloride: 110 mmol/L (ref 98–111)
Creatinine, Ser: 0.78 mg/dL (ref 0.44–1.00)
GFR, Estimated: >60 mL/min (ref >60)
Glucose, Bld: 205 mg/dL — ABNORMAL HIGH (ref 70–99)
Potassium: 3.9 mmol/L (ref 3.5–5.1)
Sodium: 142 mmol/L (ref 135–145)
Total Bilirubin: 0.4 mg/dL (ref 0.3–1.2)
Total Protein: 5.2 g/dL — ABNORMAL LOW (ref 6.5–8.1)

## 2021-05-10 LAB — AEROBIC/ANAEROBIC CULTURE W GRAM STAIN (SURGICAL/DEEP WOUND)

## 2021-05-10 LAB — PHOSPHORUS: Phosphorus: 3.8 mg/dL (ref 2.5–4.6)

## 2021-05-10 LAB — MAGNESIUM: Magnesium: 1.6 mg/dL — ABNORMAL LOW (ref 1.7–2.4)

## 2021-05-10 LAB — GLUCOSE, CAPILLARY
Glucose-Capillary: 156 mg/dL — ABNORMAL HIGH (ref 70–99)
Glucose-Capillary: 179 mg/dL — ABNORMAL HIGH (ref 70–99)
Glucose-Capillary: 183 mg/dL — ABNORMAL HIGH (ref 70–99)
Glucose-Capillary: 184 mg/dL — ABNORMAL HIGH (ref 70–99)
Glucose-Capillary: 185 mg/dL — ABNORMAL HIGH (ref 70–99)
Glucose-Capillary: 187 mg/dL — ABNORMAL HIGH (ref 70–99)
Glucose-Capillary: 195 mg/dL — ABNORMAL HIGH (ref 70–99)
Glucose-Capillary: 198 mg/dL — ABNORMAL HIGH (ref 70–99)
Glucose-Capillary: 533 mg/dL (ref 70–99)

## 2021-05-10 LAB — HEPARIN LEVEL (UNFRACTIONATED)
Heparin Unfractionated: 0.12 IU/mL — ABNORMAL LOW (ref 0.30–0.70)
Heparin Unfractionated: 0.38 IU/mL (ref 0.30–0.70)

## 2021-05-10 MED ORDER — SODIUM PHOSPHATES 45 MMOLE/15ML IV SOLN
INTRAVENOUS | Status: AC
  Filled 2021-05-10: qty 1113.6

## 2021-05-10 MED ORDER — ACETAMINOPHEN 10 MG/ML IV SOLN
1000.0000 mg | Freq: Four times a day (QID) | INTRAVENOUS | Status: AC | PRN
  Administered 2021-05-10: 1000 mg via INTRAVENOUS

## 2021-05-10 MED ORDER — POTASSIUM CHLORIDE 10 MEQ/50ML IV SOLN
10.0000 meq | INTRAVENOUS | Status: AC
Start: 2021-05-10 — End: 2021-05-10
  Administered 2021-05-10 (×2): 10 meq via INTRAVENOUS
  Filled 2021-05-10: qty 50

## 2021-05-10 MED ORDER — ACETAMINOPHEN 650 MG RE SUPP: 650.0000 mg | RECTAL | Status: DC | PRN

## 2021-05-10 MED ORDER — ACETAMINOPHEN 10 MG/ML IV SOLN
1000.0000 mg | Freq: Once | INTRAVENOUS | Status: AC
  Administered 2021-05-10: 1000 mg via INTRAVENOUS

## 2021-05-10 MED ORDER — MAGNESIUM SULFATE 2 GM/50ML IV SOLN: 2.0000 g | Freq: Once | INTRAVENOUS | Status: DC

## 2021-05-10 MED ORDER — KETOROLAC TROMETHAMINE 15 MG/ML IJ SOLN
15.0000 mg | Freq: Once | INTRAMUSCULAR | Status: AC
  Administered 2021-05-10: 15 mg via INTRAVENOUS
  Filled 2021-05-10: qty 1

## 2021-05-10 MED ORDER — LIDOCAINE 5 % EX PTCH
1.0000 | MEDICATED_PATCH | CUTANEOUS | Status: DC
  Administered 2021-05-10 – 2021-06-16 (×38): 1 via TRANSDERMAL
  Filled 2021-05-10 (×40): qty 1

## 2021-05-10 MED ORDER — MAGNESIUM SULFATE 4 GM/100ML IV SOLN
4.0000 g | Freq: Once | INTRAVENOUS | Status: AC
  Administered 2021-05-10: 4 g via INTRAVENOUS
  Filled 2021-05-10: qty 100

## 2021-05-10 MED ORDER — ACETAMINOPHEN 10 MG/ML IV SOLN
1000.0000 mg | Freq: Four times a day (QID) | INTRAVENOUS | Status: AC | PRN
  Administered 2021-05-11: 1000 mg via INTRAVENOUS
  Filled 2021-05-10 (×2): qty 100

## 2021-05-10 NOTE — Progress Notes (Signed)
      [address]       [place] [address]             [phone]      Incisional pain  BP (!) 143/63   Pulse 81   Temp 98.3 F (36.8 C) (Oral)   Resp (!) 27   Ht '5\' 1"'$  (1.549 m)   Wt 60.5 kg   SpO2 96%   BMI 25.20 kg/m   Intake/Output Summary (Last 24 hours) at 05/10/2021 1751 Last data filed at 05/10/2021 1248 Gross per 24 hour  Intake 2366.32 ml  Output 3120 ml  Net -753.68 ml   CBG moderately elevated but < 200  Continue current Rx  Izzie Geers C. Roxan Hockey, MD [facility] [phone]

## 2021-05-10 NOTE — Progress Notes (Signed)
IP rehab admissions - I met with patient, a daughter and a son at the bedside.  I spoke with patient's sister yesterday.  Family and patient would like to pursue inpatient rehab authorization.  I will begin precert process in anticipation of patient becoming medically ready over the next few days.  Call for questions.  [phone]

## 2021-05-10 NOTE — Progress Notes (Signed)
PHARMACY - TOTAL PARENTERAL NUTRITION CONSULT NOTE   Indication: s/p esophageal perforation and repair  Patient Measurements: Height: '5\' 1"'$  (154.9 cm) Weight: 60.5 kg (133 lb 6.1 oz) IBW/kg (Calculated) : 47.8   Body mass index is 25.2 kg/m. Usual Weight:   Assessment: 80 y/o female with recent lap chole for symptomatic cholelithiasis on 7/21. PMH including HTN, HLD, DM, R breast cancer, and CAD. Patient presented on 7/23 with AMS and unresponsiveness - patient was complaining of abdominal discomfort and then patient became unresponsive and slumped on table. S/p gall bladder surgery and esophageal perforation-repaired 7/24.  Plan for esophagram on 8/1.  Glucose / Insulin: DM. A1c 7.4. (Lantus 24 units daily PTA). Utilized 26 units SSI in 24 hrs + 35 units insulin in TPN. CBGs 194-231.  Electrolytes: Na 142. K 3.9, Cl 110, CoCa 10, phos 3.8, Mg 1.6 (2 gms Mag x 1)  Renal: Septic ATN s/p CRRT (end 7/25). Scr 0.78 (baseline Scr 0.88). BUN 25.  Hepatic: AST/ALT trend down (53/93), Tbiili 0.4, Albumin 1.7, Prealbumin 6.1, TG 180 Intake / Output; MIVF:  UOP 2.2 mL/kg/hr. Chest tube 130 mL. Net +2.2L GI Imaging: - 7/23 CT: No liver abnormality. Pancreas unremarkable. Small spleen. BL renal masses. Normal stomach/bowels.   GI Surgeries / Procedures:  - 7/21: S/P laparoscopic cholecystectomy and LOA - 7/24: S/P R thoracotomy, repair of esophageal perforation with muscle flap, drainage of empyema and decortication of R lung  Central access: PICC 7/26 (double lumen) TPN start date: 7/26  Nutritional Goals (per RD recommendation on 7/26): kCal: 1750-2030 , Protein: 87-116 , Fluid:  >=1.8L Goal TPN rate is 80 mL/hr (provides 111 g of protein and 1808 kcals per day)  Current Nutrition:  NPO  Plan:  IContinue TPN at goal rate of 80 mL/hr at 1800 will increase to goal dextrose now that hyperglycemia is better controlled. This will provide 111g AA meeting 100% protein needs and 1808 kcal  meeting 100% of kcal needs - dextrose concentration increased from 9% to 12% Electrolytes in TPN: Ca to 3 mEq/L. Continue Na 50 mEq/L, incr K 28 mEq/L, incr Mg 3 mEq/L, Phos 20 mmol/L. Cl:Ac 1:1 Kcl 10 mEq x2  Mag 2 gm IV x1  Add standard MVI and trace elements to TPN Continue CVTS q4h SSI and adjust as needed  Increase insulin in TPN to 40 units and monitor CBGs Monitor TPN labs on Mon/Thurs, repeat electrolytes tomorrow   Alanda Slim, PharmD, [facility] Clinical Pharmacist Please see AMION for all Pharmacists' Contact Phone Numbers 05/10/2021, 8:05 AM

## 2021-05-10 NOTE — Progress Notes (Signed)
[facility] ADULT ICU REPLACEMENT PROTOCOL   The patient does apply for the [facility] Adult ICU Electrolyte Replacment Protocol based on the criteria listed below:   1.Exclusion criteria: TCTS patients, ECMO patients and Hypothermia Protocol, and   Dialysis patients 2. Is GFR >/= 30 ml/min? Yes.    Patient's GFR today is >60 3. Is SCr </= 2? No. Patient's SCr is 0.78 mg/dL 4. Did SCr increase >/= 0.5 in 24 hours? No. 5.Pt's weight >40kg  Yes.   6. Abnormal electrolyte(s): mag 1.6  7. Electrolytes replaced per protocol 8.  Call MD STAT for K+ </= 2.5, Phos </= 1, or Mag </= 1 Physician:  yes, stat lab results as ordered  Darlys Gales 05/10/2021 1:58 AM

## 2021-05-10 NOTE — Progress Notes (Signed)
Physical Therapy Treatment Patient Details Name: Dana Coleman MRN: [id number] DOB: 10/02/1941 Today's Date: 05/10/2021    History of Present Illness Pt is a 80 yo female admitted on 05/04/21 for AMS. HX of cholecystectomy on 7/21.  Intubated upon admission, 7/24 Started CRRT, Esophageal perforation with septic shock post flap repair, drainage of empyema and decortication of R lung with chest tube placed.  7/25 EKG changes and probable nstemi. Extubated 7/26. PMH includes: R breast CA, DM, CAD, HTN.    PT Comments    Pt progressing slowly toward goals, limited by her flank pain and chest tube pain.  Emphasis on transition to EOB via Left elbow, scooting to EOB, sit to stand and transfer with RW with stand pivot to the chair.   Follow Up Recommendations  CIR     Equipment Recommendations  None recommended by PT    Recommendations for Other Services       Precautions / Restrictions Precautions Precautions: Fall Precaution Comments: R chest tube    Mobility  Bed Mobility Overal bed mobility: Needs Assistance Bed Mobility: Rolling;Sidelying to Sit Rolling: Mod assist;+2 for safety/equipment Sidelying to sit: +2 for safety/equipment;Max assist       General bed mobility comments: cues for technique, max assist for LEs over EOB and mod assist to raise trunk    Transfers Overall transfer level: Needs assistance Equipment used: Rolling walker (2 wheeled) Transfers: Sit to/from Stand Sit to Stand: +2 physical assistance;Mod assist Stand pivot transfers: Mod assist;+2 physical assistance       General transfer comment: performed sit to stand x 2 from bed, x 1 from recliner, assist to rise and steady, cues for hand placement, took steps to chair with +2 mod assist and use of RW, attempts to sit abruptly  Ambulation/Gait Ambulation/Gait assistance: Mod assist;+2 physical assistance Gait Distance (Feet): 4 Feet Assistive device: Rolling walker (2 wheeled) Gait  Pattern/deviations: Step-to pattern     General Gait Details: pivotal steps to chair with stability assist, help with the RW and balance stepping backward to the chair.   Stairs             Wheelchair Mobility    Modified Rankin (Stroke Patients Only)       Balance Overall balance assessment: Needs assistance Sitting-balance support: No upper extremity supported;Feet supported Sitting balance-Leahy Scale: Fair Sitting balance - Comments: min guard   Standing balance support: Bilateral upper extremity supported;During functional activity Standing balance-Leahy Scale: Poor Standing balance comment: relies on BUE and external support                            Cognition Arousal/Alertness: Awake/alert Behavior During Therapy: WFL for tasks assessed/performed Overall Cognitive Status: Impaired/Different from baseline Area of Impairment: Following commands;Problem solving                       Following Commands: Follows one step commands with increased time;Follows multi-step commands inconsistently     Problem Solving: Slow processing;Decreased initiation;Requires verbal cues;Difficulty sequencing;Requires tactile cues General Comments: Reluctantly agreeable to OOB activities due to pain at chest tube site and fatigue, educated pt in importance of activity to help regain her strength and endurance.      Exercises Other Exercises Other Exercises: warm up hip/knee flex/ext ROM exercise prior to mobility.  graded assist/resistance x 10 reps.    General Comments        Pertinent Vitals/Pain Pain Assessment: Faces  Faces Pain Scale: Hurts even more Pain Location: R flank Pain Descriptors / Indicators: Discomfort;Grimacing;Guarding;Operative site guarding Pain Intervention(s): Monitored during session    Home Living                      Prior Function            PT Goals (current goals can now be found in the care plan section)  Acute Rehab PT Goals Patient Stated Goal: get back to housework PT Goal Formulation: With patient Time For Goal Achievement: 05/21/21 Potential to Achieve Goals: Good Progress towards PT goals: Progressing toward goals    Frequency    Min 3X/week      PT Plan Current plan remains appropriate    Co-evaluation PT/OT/SLP Co-Evaluation/Treatment: Yes Reason for Co-Treatment: For patient/therapist safety PT goals addressed during session: Mobility/safety with mobility OT goals addressed during session: ADL's and self-care      AM-PAC PT "6 Clicks" Mobility   Outcome Measure  Help needed turning from your back to your side while in a flat bed without using bedrails?: A Lot Help needed moving from lying on your back to sitting on the side of a flat bed without using bedrails?: A Lot   Help needed standing up from a chair using your arms (e.g., wheelchair or bedside chair)?: Total Help needed to walk in hospital room?: Total Help needed climbing 3-5 steps with a railing? : Total 6 Click Score: 7    End of Session   Activity Tolerance: Patient limited by pain;Patient limited by fatigue Patient left: in chair;with chair alarm set;with call bell/phone within reach Nurse Communication: Mobility status PT Visit Diagnosis: Other abnormalities of gait and mobility (R26.89);Muscle weakness (generalized) (M62.81);Difficulty in walking, not elsewhere classified (R26.2)     Time: [id number] PT Time Calculation (min) (ACUTE ONLY): 37 min  Charges:  $Therapeutic Activity: 8-22 mins                     05/10/2021  Ginger Carne., PT Acute Rehabilitation Services [phone]  (pager) [phone]  (office)   Tessie Fass Merlina Marchena 05/10/2021, 3:00 PM

## 2021-05-10 NOTE — Progress Notes (Signed)
NAME:  Dana Coleman, MRN:  [id number], DOB:  1941-03-28, LOS: 6 ADMISSION DATE:  05/04/2021, CONSULTATION DATE:  7/23 REFERRING MD:  Ralene Bathe, CHIEF COMPLAINT:  Dyspnea   History of Present Illness:  80 y/o female admitted for empyema after a lap chole, noted to have esophageal perforation.  Pertinent  Medical History  DM2 CAD Hypertension Hyperlipidemia Prior R breast cancer  Significant Hospital Events: Including procedures, antibiotic start and stop dates in addition to other pertinent events   7/23 admitted intubated. Pigtail by [facility]CS in ED -- interestingly seems to have bilious outpt 7/24 Starting CRRT. Abundant GPCs in pleural fluid 7/24 1.  Right thoracotomy 2.  Repair of esophageal perforation with intercostal pedicled muscle flap. 3.  Drainage of empyema and decortication of right lung 7/25 early AM EKG changes, trop leak, probable inferiror NSTEMI, not candidate for intervention.  Echo with mildly reduced LV, large RV ?cause 7/26 extubated 7/29 up out of bed, rising WBC  Interim History / Subjective:  WBC up some R arm infiltrated Pain in chest with cough Pain better controlled with IV tylenol  Objective   Blood pressure (!) 145/79, pulse 69, temperature 98 F (36.7 C), temperature source Oral, resp. rate (!) 25, height '5\' 1"'$  (1.549 m), weight 60.5 kg, SpO2 94 %.        Intake/Output Summary (Last 24 hours) at 05/10/2021 0846 Last data filed at 05/10/2021 0700 Gross per 24 hour  Intake 3143.32 ml  Output 3820 ml  Net -676.68 ml   Filed Weights   05/04/21 1625 05/10/21 0500  Weight: 58 kg 60.5 kg    Examination:  General:  Resting comfortably in chair HENT: NCAT OP clear PULM: CTA B, normal effort CV: RRR, no mgr GI: BS+, soft, nontender MSK: normal bulk and tone Neuro: awake, alert, no distress, MAEW  7/28 CXR personally reviewed, no infiltrate 7/29 EKG> flutter, non specific ST wave changes  Resolved Hospital Problem list     Assessment & Plan:   Empysema, polymicrobial due to esophageal perforation Continue unasyn and eraxis   Pain post op IV tylenol  AKI: resolved Monitor BMET and UOP Replace electrolytes as needed  Rising WBC, no clear cause, drains in good position, slight cough Continue antibiotics Repeat CXR in AM  Esophageal perforation, s/p repair NPO Barium esophogram on Monday 8/1  Physical deconditioning Out of bed PT following  NSTEMI with WMA and dilated RV Tele Supportive care  Atrial flutter Heparin infusion to continue Tele monitoring Continue IV metoprolol  Hyperglycemia with DM2 Continue SSI  Best Practice (right click and "Reselect all SmartList Selections" daily)   Diet/type: NPO and TPN DVT prophylaxis: systemic heparin GI prophylaxis: N/A Lines: Central line and yes and it is still needed Foley:  N/A Code Status:  full code Last date of multidisciplinary goals of care discussion [n/a]  Labs   CBC: Recent Labs  Lab 05/04/21 1632 05/04/21 1643 05/07/21 0357 05/07/21 0516 05/08/21 0345 05/08/21 0456 05/09/21 0405 05/10/21 0041  WBC 12.3*   < > 15.3*  --  10.6* 11.6* 13.6* 18.6*  NEUTROABS 9.5*  --   --   --  8.9*  --   --   --   HGB 10.8*   < > 10.1* 10.9* 9.0* 9.5* 9.8* 9.2*  HCT 37.3   < > 29.8* 32.0* 26.9* 28.4* 29.8* 26.8*  MCV 104.2*   < > 87.6  --  90.6 89.0 89.2 89.9  PLT 168   < > 85*  --  66* 75* 70* 92*   < > = values in this interval not displayed.    Basic Metabolic Panel: Recent Labs  Lab 05/08/21 0345 05/08/21 0456 05/08/21 1530 05/09/21 0405 05/10/21 0041  NA 135 140 140 141 142  K 4.7 3.5 4.0 3.5 3.9  CL 101 105 108 108 110  CO2 '26 28 29 26 25  '$ GLUCOSE 735* 207* 231* 197* 205*  BUN 30* 30* 27* 24* 25*  CREATININE 1.28* 1.25* 1.04* 0.95 0.78  CALCIUM 8.4* 8.4* 8.7* 8.9 9.0  MG 2.3 2.3 2.0 2.0 1.6*  PHOS 5.9* 2.4* 3.2 2.8 3.8   GFR: Estimated Creatinine Clearance: 47.6 mL/min (by C-G formula based on SCr of 0.78 mg/dL). Recent Labs  Lab  05/04/21 1637 05/04/21 1912 05/04/21 2155 05/05/21 0001 05/08/21 0345 05/08/21 0456 05/09/21 0405 05/10/21 0041  WBC  --   --   --    < > 10.6* 11.6* 13.6* 18.6*  LATICACIDVEN >11.0* >11.0* >11.0*  --   --   --   --   --    < > = values in this interval not displayed.    Liver Function Tests: Recent Labs  Lab 05/08/21 0345 05/08/21 0456 05/08/21 1530 05/09/21 0405 05/10/21 0041  AST 133* 140* 106* 89* 53*  ALT 171* 188* 160* 138* 92*  ALKPHOS 87 91 87 84 74  BILITOT 0.5 0.7 0.6 0.6 0.4  PROT 4.6* 5.0* 5.2* 5.3* 5.2*  ALBUMIN 1.7* 1.8* 1.8* 1.9* 1.7*   No results for input(s): LIPASE, AMYLASE in the last 168 hours. No results for input(s): AMMONIA in the last 168 hours.  ABG    Component Value Date/Time   PHART 7.415 05/06/2021 0113   PCO2ART 42.1 05/06/2021 0113   PO2ART 105 05/06/2021 0113   HCO3 27.8 05/07/2021 0516   TCO2 29 05/07/2021 0516   ACIDBASEDEF 14.0 (H) 05/04/2021 2049   O2SAT 96.0 05/07/2021 0516     Coagulation Profile: Recent Labs  Lab 05/05/21 0001 05/05/21 0803  INR 1.9* 1.9*    Cardiac Enzymes: No results for input(s): CKTOTAL, CKMB, CKMBINDEX, TROPONINI in the last 168 hours.  HbA1C: Hemoglobin A1C  Date/Time Value Ref Range Status  12/18/2020 10:20 AM 6.8 (A) 4.0 - 5.6 % Final  08/14/2020 01:35 PM 7.2 (A) 4.0 - 5.6 % Final   HbA1c, POC (controlled diabetic range)  Date/Time Value Ref Range Status  05/15/2020 03:27 PM 8.2 (A) 0.0 - 7.0 % Final  10/28/2019 02:30 PM 8.2 (A) 0.0 - 7.0 % Final   Hgb A1c MFr Bld  Date/Time Value Ref Range Status  04/26/2021 11:21 AM 7.4 (H) 4.8 - 5.6 % Final    Comment:    (NOTE) Pre diabetes:          5.7%-6.4%  Diabetes:              >6.4%  Glycemic control for   <7.0% adults with diabetes     CBG: Recent Labs  Lab 05/09/21 1602 05/09/21 2051 05/10/21 0010 05/10/21 0448 05/10/21 0758  GLUCAP 205* 179* 198* 156* 185*    Critical care time: 33 minutes    Roselie Awkward,  MD [place] [facility] Pager: [phone] Cell: [phone] After 7:00 pm call Elink  [phone]

## 2021-05-10 NOTE — Progress Notes (Signed)
5 Days Post-Op Procedure(s) (LRB): THORACOTOMY MAJOR REPAIR PERFORATED ESOPHAGUS (Right) Subjective: Complains of chest wall pain. Sitting up in chair.  Objective: Vital signs in last 24 hours: Temp:  [98 F (36.7 C)-99.5 F (37.5 C)] 98 F (36.7 C) (07/29 0756) Pulse Rate:  [59-105] 69 (07/29 0700) Cardiac Rhythm: Atrial flutter (07/29 0400) Resp:  [21-38] 25 (07/29 0700) BP: (127-157)/(63-84) 145/79 (07/29 0700) SpO2:  [88 %-99 %] 94 % (07/29 0700) Weight:  [60.5 kg] 60.5 kg (07/29 0500)  Hemodynamic parameters for last 24 hours:    Intake/Output from previous day: 07/28 0701 - 07/29 0700 In: 3237.9 [I.V.:1976.1; IV Piggyback:1261.8] Out: 4220 [Urine:4100; Chest Tube:120] Intake/Output this shift: No intake/output data recorded.  General appearance: alert and cooperative Neurologic: intact Heart: irregularly irregular rhythm Lungs: clear to auscultation bilaterally Abdomen: soft, non-tender; bowel sounds normal; no masses,  no organomegaly Extremities: edema mild Wound: dressing dry Chest tube output thin, serous.  Lab Results: Recent Labs    05/09/21 0405 05/10/21 0041  WBC 13.6* 18.6*  HGB 9.8* 9.2*  HCT 29.8* 26.8*  PLT 70* 92*   BMET:  Recent Labs    05/09/21 0405 05/10/21 0041  NA 141 142  K 3.5 3.9  CL 108 110  CO2 26 25  GLUCOSE 197* 205*  BUN 24* 25*  CREATININE 0.95 0.78  CALCIUM 8.9 9.0    PT/INR: No results for input(s): LABPROT, INR in the last 72 hours. ABG    Component Value Date/Time   PHART 7.415 05/06/2021 0113   HCO3 27.8 05/07/2021 0516   TCO2 29 05/07/2021 0516   ACIDBASEDEF 14.0 (H) 05/04/2021 2049   O2SAT 96.0 05/07/2021 0516   CBG (last 3)  Recent Labs    05/10/21 0010 05/10/21 0448 05/10/21 0758  GLUCAP 198* 156* 185*    Assessment/Plan: S/P Procedure(s) (LRB): THORACOTOMY MAJOR REPAIR PERFORATED ESOPHAGUS (Right)  POD 5 Hemodynamically stable. Rate controlled atrial fib with intermittent sinus. Continue  heparin.  Continue chest tubes to suction  TNA for nutrition pending esophagram Monday.  Antibiotics per CCM. WBC has been trending upward with Tmax 99 yesterday. WBC was minimally elevated preop. May be inflammatory.   IS, OOB, mobilize as tolerated.  IV Tylenol for pain. Avoid narcotics in this pt to keep her as awake and active as possible.  LOS: 6 days    Dana Coleman 05/10/2021

## 2021-05-10 NOTE — Progress Notes (Signed)
[place] for heparin Indication: atrial fibrillation  No Known Allergies  Patient Measurements: Height: '5\' 1"'$  (154.9 cm) Weight: 60.5 kg (133 lb 6.1 oz) IBW/kg (Calculated) : 47.8 Heparin Dosing Weight: TBW  Vital Signs: Temp: 98.4 F (36.9 C) (07/29 0300) Temp Source: Oral (07/29 0300) BP: 145/79 (07/29 0700) Pulse Rate: 69 (07/29 0700)  Labs: Recent Labs    05/08/21 0456 05/08/21 1530 05/09/21 0405 05/09/21 1600 05/10/21 0041  HGB 9.5*  --  9.8*  --  9.2*  HCT 28.4*  --  29.8*  --  26.8*  PLT 75*  --  70*  --  92*  HEPARINUNFRC  --   --   --  0.12* 0.12*  CREATININE 1.25* 1.04* 0.95  --  0.78     Estimated Creatinine Clearance: 47.6 mL/min (by C-G formula based on SCr of 0.78 mg/dL).   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 Post-op experienced a fib. Pharmacy dosing heparin with low heparin goal due to recent procedures and thrombocytopenia. -heparin level = 0.21 on 1000 units/hr  Goal of Therapy:  Heparin level goal of 0.3-0.5 given  low platelets  Monitor platelets by anticoagulation protocol: Yes   Plan:  -Increase hepairn drip to 1200 uts/hr  -Heparin level in 8 hours and daily wth CBC daily  Hildred Laser, PharmD Clinical Pharmacist **Pharmacist phone directory can now be found on [email or web address] ([email or web address]).  Listed under [place].

## 2021-05-10 NOTE — Progress Notes (Signed)
Occupational Therapy Treatment Patient Details Name: Dana Coleman MRN: [id number] DOB: [birth year] Today's Date: 05/10/2021    History of present illness Pt is a 80 yo female admitted on 05/04/21 for AMS. HX of cholecystectomy on 7/21.  Intubated upon admission, 7/24 Started CRRT, Esophageal perforation with septic shock post flap repair, drainage of empyema and decortication of R lung with chest tube placed.  7/25 EKG changes and probable nstemi. Extubated 7/26. PMH includes: R breast CA, DM, CAD, HTN.   OT comments  Pt reluctant to move, due to pain at chest tube site and general fatigue, but agreed and was able to ambulate to chair a few feet from bed with RW and +2 mod assist. Needs intermittent seated rest breaks. Pt able to maintain Sp02 92% on 2L with elevated RR, cues to attempt deeper breathing. Pt remained up in chair at end of session asking to find Lorra Hals on tv. Continue to recommend CIR as pt is making steady gains.   Follow Up Recommendations  CIR;Supervision/Assistance - 24 hour    Equipment Recommendations  3 in 1 bedside commode    Recommendations for Other Services      Precautions / Restrictions Precautions Precautions: Fall Precaution Comments: R chest tube       Mobility Bed Mobility Overal bed mobility: Needs Assistance Bed Mobility: Rolling;Sidelying to Sit Rolling: Mod assist;+2 for safety/equipment Sidelying to sit: +2 for safety/equipment;Max assist       General bed mobility comments: cues for technique, max assist for LEs over EOB and mod assist to raise trunk    Transfers Overall transfer level: Needs assistance Equipment used: Rolling walker (2 wheeled) Transfers: Sit to/from Stand Sit to Stand: +2 physical assistance;Mod assist         General transfer comment: performed sit to stand x 2 from bed, x 1 from recliner, assist to rise and steady, cues for hand placement, took steps to chair with +2 mod assist and use of RW, attempts to  sit abruptly    Balance Overall balance assessment: Needs assistance Sitting-balance support: No upper extremity supported;Feet supported Sitting balance-Leahy Scale: Fair Sitting balance - Comments: min guard   Standing balance support: Bilateral upper extremity supported;During functional activity Standing balance-Leahy Scale: Poor Standing balance comment: relies on BUE and external support                           ADL either performed or assessed with clinical judgement   ADL Overall ADL's : Needs assistance/impaired                     Lower Body Dressing: Total assistance;Bed level   Toilet Transfer: +2 for physical assistance;Moderate assistance;Ambulation;RW Toilet Transfer Details (indicate cue type and reason): simulated to recliner Toileting- Clothing Manipulation and Hygiene: Total assistance;+2 for physical assistance;Sit to/from stand Toileting - Clothing Manipulation Details (indicate cue type and reason): pt with bowel incontinence     Functional mobility during ADLs: +2 for physical assistance;Moderate assistance;Rolling walker       Vision       Perception     Praxis      Cognition Arousal/Alertness: Awake/alert Behavior During Therapy: WFL for tasks assessed/performed Overall Cognitive Status: Impaired/Different from baseline Area of Impairment: Following commands;Problem solving                       Following Commands: Follows one step commands with increased time;Follows multi-step  commands inconsistently     Problem Solving: Slow processing;Decreased initiation;Requires verbal cues;Difficulty sequencing;Requires tactile cues General Comments: Reluctantly agreeable to OOB activities due to pain at chest tube site and fatigue, educated pt in importance of activity to help regain her strength and endurance.        Exercises     Shoulder Instructions       General Comments      Pertinent Vitals/ Pain        Pain Assessment: Faces Faces Pain Scale: Hurts even more Pain Location: R flank Pain Descriptors / Indicators: Discomfort;Grimacing;Guarding;Operative site guarding Pain Intervention(s): Monitored during session;Repositioned;Premedicated before session  Home Living                                          Prior Functioning/Environment              Frequency  Min 2X/week        Progress Toward Goals  OT Goals(current goals can now be found in the care plan section)  Progress towards OT goals: Progressing toward goals  Acute Rehab OT Goals Patient Stated Goal: get back to housework OT Goal Formulation: With patient Time For Goal Achievement: 05/22/21 Potential to Achieve Goals: Good  Plan Discharge plan remains appropriate    Co-evaluation    PT/OT/SLP Co-Evaluation/Treatment: Yes Reason for Co-Treatment: For patient/therapist safety;Complexity of the patient's impairments (multi-system involvement)   OT goals addressed during session: ADL's and self-care;Strengthening/ROM      AM-PAC OT "6 Clicks" Daily Activity     Outcome Measure   Help from another person eating meals?: Total Help from another person taking care of personal grooming?: A Lot Help from another person toileting, which includes using toliet, bedpan, or urinal?: A Lot Help from another person bathing (including washing, rinsing, drying)?: A Lot Help from another person to put on and taking off regular upper body clothing?: A Lot Help from another person to put on and taking off regular lower body clothing?: Total 6 Click Score: 10    End of Session Equipment Utilized During Treatment: Oxygen;Rolling walker  OT Visit Diagnosis: Other abnormalities of gait and mobility (R26.89);Muscle weakness (generalized) (M62.81);Pain;Other symptoms and signs involving cognitive function Pain - Right/Left: Right   Activity Tolerance Patient limited by fatigue   Patient Left in chair;with  call bell/phone within reach;with chair alarm set   Nurse Communication Mobility status        Time: [id number] OT Time Calculation (min): 37 min  Charges: OT General Charges $OT Visit: 1 Visit OT Treatments $Therapeutic Activity: 8-22 mins  Nestor Lewandowsky, OTR/L Acute Rehabilitation Services Pager: [phone] Office: [phone]  Malka So 05/10/2021, 2:43 PM

## 2021-05-10 NOTE — Progress Notes (Signed)
ANTICOAGULATION CONSULT NOTE   Pharmacy Consult for heparin Indication: atrial fibrillation  No Known Allergies  Patient Measurements: Height: '5\' 1"'$  (154.9 cm) Weight: 60.5 kg (133 lb 6.1 oz) IBW/kg (Calculated) : 47.8 Heparin Dosing Weight: TBW  Vital Signs: Temp: 98.3 F (36.8 C) (07/29 1525) Temp Source: Oral (07/29 1525) BP: 136/58 (07/29 1800) Pulse Rate: 81 (07/29 1800)  Labs: Recent Labs    05/08/21 0456 05/08/21 1530 05/09/21 0405 05/09/21 1600 05/10/21 0041 05/10/21 1634  HGB 9.5*  --  9.8*  --  9.2*  --   HCT 28.4*  --  29.8*  --  26.8*  --   PLT 75*  --  70*  --  92*  --   HEPARINUNFRC  --   --   --  0.12* 0.12* 0.38  CREATININE 1.25* 1.04* 0.95  --  0.78  --      Estimated Creatinine Clearance: 47.6 mL/min (by C-G formula based on SCr of 0.78 mg/dL).   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 Post-op experienced a fib. Pharmacy dosing heparin with low heparin goal due to recent procedures and thrombocytopenia. -heparin level = 0.38  on heparin drip 1200 units/hr  Goal of Therapy:  Heparin level goal of 0.3-0.5 given low platelets  Monitor platelets by anticoagulation protocol: Yes   Plan:  Continue heparin drip 1200 uts/hr  -Heparin level daily wth CBC daily   Bonnita Nasuti Pharm.D. CPP, BCPS Clinical Pharmacist [phone] 05/10/2021 7:20 PM   **Pharmacist phone directory can now be found on [email or web address] ([email or web address]).  Listed under [place].

## 2021-05-11 ENCOUNTER — Inpatient Hospital Stay (HOSPITAL_COMMUNITY): Payer: Medicare Other

## 2021-05-11 DIAGNOSIS — N179 Acute kidney failure, unspecified: Secondary | ICD-10-CM | POA: Diagnosis not present

## 2021-05-11 DIAGNOSIS — J9602 Acute respiratory failure with hypercapnia: Secondary | ICD-10-CM | POA: Diagnosis not present

## 2021-05-11 LAB — COMPREHENSIVE METABOLIC PANEL
ALT: 54 U/L — ABNORMAL HIGH (ref 0–44)
AST: 39 U/L (ref 15–41)
Albumin: 1.5 g/dL — ABNORMAL LOW (ref 3.5–5.0)
Alkaline Phosphatase: 62 U/L (ref 38–126)
Anion gap: 6 (ref 5–15)
BUN: 29 mg/dL — ABNORMAL HIGH (ref 8–23)
CO2: 23 mmol/L (ref 22–32)
Calcium: 8.4 mg/dL — ABNORMAL LOW (ref 8.9–10.3)
Chloride: 110 mmol/L (ref 98–111)
Creatinine, Ser: 0.74 mg/dL (ref 0.44–1.00)
GFR, Estimated: >60 mL/min (ref >60)
Glucose, Bld: 627 mg/dL (ref 70–99)
Potassium: 5.2 mmol/L — ABNORMAL HIGH (ref 3.5–5.1)
Sodium: 139 mmol/L (ref 135–145)
Total Bilirubin: 0.3 mg/dL (ref 0.3–1.2)
Total Protein: 5 g/dL — ABNORMAL LOW (ref 6.5–8.1)

## 2021-05-11 LAB — PHOSPHORUS
Phosphorus: 6.3 mg/dL — ABNORMAL HIGH (ref 2.5–4.6)
Phosphorus: 3.4 mg/dL (ref 2.5–4.6)

## 2021-05-11 LAB — CBC WITH DIFFERENTIAL/PLATELET
Abs Immature Granulocytes: 0.71 10*3/uL — ABNORMAL HIGH (ref 0.00–0.07)
Basophils Absolute: 0.1 10*3/uL (ref 0.0–0.1)
Basophils Relative: 0 %
Eosinophils Absolute: 0.2 10*3/uL (ref 0.0–0.5)
Eosinophils Relative: 1 %
HCT: 21.9 % — ABNORMAL LOW (ref 36.0–46.0)
Hemoglobin: 7.5 g/dL — ABNORMAL LOW (ref 12.0–15.0)
Immature Granulocytes: 3 %
Lymphocytes Relative: 3 %
Lymphs Abs: 0.9 10*3/uL (ref 0.7–4.0)
MCH: 31.1 pg (ref 26.0–34.0)
MCHC: 34.2 g/dL (ref 30.0–36.0)
MCV: 90.9 fL (ref 80.0–100.0)
Monocytes Absolute: 1.2 10*3/uL — ABNORMAL HIGH (ref 0.1–1.0)
Monocytes Relative: 5 %
Neutro Abs: 23.5 10*3/uL — ABNORMAL HIGH (ref 1.7–7.7)
Neutrophils Relative %: 88 %
Platelets: 129 10*3/uL — ABNORMAL LOW (ref 150–400)
RBC: 2.41 MIL/uL — ABNORMAL LOW (ref 3.87–5.11)
RDW: 15.8 % — ABNORMAL HIGH (ref 11.5–15.5)
WBC: 26.6 10*3/uL — ABNORMAL HIGH (ref 4.0–10.5)
nRBC: 0.2 % (ref 0.0–0.2)

## 2021-05-11 LAB — CULTURE, BLOOD (ROUTINE X 2)
Culture: NO GROWTH
Culture: NO GROWTH

## 2021-05-11 LAB — GLUCOSE, CAPILLARY
Glucose-Capillary: 170 mg/dL — ABNORMAL HIGH (ref 70–99)
Glucose-Capillary: 178 mg/dL — ABNORMAL HIGH (ref 70–99)
Glucose-Capillary: 170 mg/dL — ABNORMAL HIGH (ref 70–99)
Glucose-Capillary: 154 mg/dL — ABNORMAL HIGH (ref 70–99)
Glucose-Capillary: 173 mg/dL — ABNORMAL HIGH (ref 70–99)

## 2021-05-11 LAB — URINALYSIS, ROUTINE W REFLEX MICROSCOPIC
Bacteria, UA: NONE SEEN
Bilirubin Urine: NEGATIVE
Glucose, UA: >=500 mg/dL — AB
Ketones, ur: NEGATIVE mg/dL
Leukocytes,Ua: NEGATIVE
Nitrite: NEGATIVE
Protein, ur: NEGATIVE mg/dL
Specific Gravity, Urine: 1.017 (ref 1.005–1.030)
pH: 5 (ref 5.0–8.0)

## 2021-05-11 LAB — BASIC METABOLIC PANEL
Anion gap: 10 (ref 5–15)
BUN: 31 mg/dL — ABNORMAL HIGH (ref 8–23)
CO2: 21 mmol/L — ABNORMAL LOW (ref 22–32)
Calcium: 8.9 mg/dL (ref 8.9–10.3)
Chloride: 114 mmol/L — ABNORMAL HIGH (ref 98–111)
Creatinine, Ser: 0.73 mg/dL (ref 0.44–1.00)
GFR, Estimated: >60 mL/min (ref >60)
Glucose, Bld: 204 mg/dL — ABNORMAL HIGH (ref 70–99)
Potassium: 3.9 mmol/L (ref 3.5–5.1)
Sodium: 145 mmol/L (ref 135–145)

## 2021-05-11 LAB — HEPARIN LEVEL (UNFRACTIONATED): Heparin Unfractionated: 0.46 IU/mL (ref 0.30–0.70)

## 2021-05-11 LAB — MAGNESIUM
Magnesium: 2 mg/dL (ref 1.7–2.4)
Magnesium: 1.8 mg/dL (ref 1.7–2.4)

## 2021-05-11 MED ORDER — INSULIN DETEMIR 100 UNIT/ML ~~LOC~~ SOLN
20.0000 [IU] | Freq: Every day | SUBCUTANEOUS | Status: DC
  Filled 2021-05-11: qty 0.2

## 2021-05-11 MED ORDER — MAGNESIUM SULFATE 2 GM/50ML IV SOLN
2.0000 g | Freq: Once | INTRAVENOUS | Status: AC
  Administered 2021-05-11: 2 g via INTRAVENOUS
  Filled 2021-05-11: qty 50

## 2021-05-11 MED ORDER — STERILE WATER FOR INJECTION IV SOLN
INTRAVENOUS | Status: AC
  Filled 2021-05-11: qty 1113.6

## 2021-05-11 NOTE — Progress Notes (Signed)
PHARMACY - TOTAL PARENTERAL NUTRITION CONSULT NOTE  Indication: s/p esophageal perforation and repair  Patient Measurements: Height: '5\' 1"'$  (154.9 cm) Weight: 60.5 kg (133 lb 6.1 oz) IBW/kg (Calculated) : 47.8   Body mass index is 25.2 kg/m.  Assessment:  80 y/o female with recent lap chole for symptomatic cholelithiasis on 7/21. PMH including HTN, HLD, DM, R breast cancer, and CAD. Patient presented on 7/23 with AMS and unresponsiveness - was complaining of abdominal discomfort and then patient became unresponsive and slumped on table.  S/p gall bladder surgery and esophageal perforation-repaired 7/24.  Pharmacy consulted for TPN management.  Glucose / Insulin: DM with A1c 7.4% on Lantus 24/d PTA. CBGs improving. Utilized 22 units SSI in 24 hrs + 40 units insulin in TPN Electrolytes: first set of labs contaminated with TPN Repeat labs - K 3.9 post 2 runs, Mag 1.8 post 4gm, high CL, low CO2, CoCa 10.9 Renal: septic ATN s/p CRRT (end 7/25) - SCr < 1, BUN 29 Hepatic: LFTs normalizing, tbili / WNL, albumin 1.5, prealbumin 6.1, TG mildly elevated at 180 Intake / Output; MIVF:  UOP 1.3 mL/kg/hr, chest tube 39m, net +2L GI Imaging: - 7/23 CT: no acute abnormalities  GI Surgeries / Procedures:  - 7/21: s/p laparoscopic cholecystectomy and LOA - 7/24: s/p R thoracotomy, repair of esophageal perf with muscle flap, drainage of empyema and decortication of R lung  Central access: PICC 7/26 (double lumen) TPN start date: 05/07/21  Nutritional Goals (per RD rec on 7/26): kCal: 1750-2030 , Protein: 87-116 , Fluid:  >=1.8L  Current Nutrition:  TPN  Plan:  Continue TPN at goal rate of 80 ml/hr to provide 111g AA, 230g CHO and 58g ILE for a total of 1805 kCal, meeting 100% of patient's needs Electrolytes in TPN: reduce Na 27m/L, increase K 3070mL, Ca 0mE37m, increase Mg to 5mEq89m Phos 20mmo26m change Cl:Ac 1:2 Add standard MVI and trace elements to TPN Continue CVTS SSI Q4H and increase  regular insulin in TPN to 55 units D/C Levemir 20 units daily per discussion with CVTS Mag sulfate 2gm IV x 1 Monitor TPN labs on Mon/Thurs  Plan for esophagram on 8/1  Dana Coleman, Dana Coleman, BCPS, BCCCP [place]2022, 10:44 AM

## 2021-05-11 NOTE — Progress Notes (Signed)
eLink Physician-Brief Progress Note Patient Name: Dana Coleman DOB: November 22, 1940 MRN: [id number]   Date of Service  05/11/2021  HPI/Events of Note  Inquiry if metoprolol should be held due to transient bradycardia in the 60s Metoprolol ordered 2.5 IV q 6 Observed for 5 minutes and HR mostly in the 80s BP 146/66 Electrolytes corrected this morning  eICU Interventions  Ok to give due low dose metoprolol Would hold if HR persistently <55 Discussed with bedside RN     Intervention Category Intermediate Interventions: Other:  Judd Lien 05/11/2021, 12:07 AM

## 2021-05-11 NOTE — Plan of Care (Signed)
  Problem: Education: Goal: Knowledge of General Education information will improve Description: Including pain rating scale, medication(s)/side effects and non-pharmacologic comfort measures Outcome: Progressing   Problem: Health Behavior/Discharge Planning: Goal: Ability to manage health-related needs will improve Outcome: Progressing   Problem: Clinical Measurements: Goal: Ability to maintain clinical measurements within normal limits will improve Outcome: Progressing Goal: Will remain free from infection Outcome: Progressing Goal: Diagnostic test results will improve Outcome: Progressing Goal: Respiratory complications will improve Outcome: Progressing Goal: Cardiovascular complication will be avoided Outcome: Progressing   Problem: Activity: Goal: Risk for activity intolerance will decrease Outcome: Progressing   Problem: Nutrition: Goal: Adequate nutrition will be maintained Outcome: Progressing   Problem: Coping: Goal: Level of anxiety will decrease Outcome: Progressing   Problem: Elimination: Goal: Will not experience complications related to bowel motility Outcome: Progressing Goal: Will not experience complications related to urinary retention Outcome: Progressing   Problem: Pain Managment: Goal: General experience of comfort will improve Outcome: Progressing   Problem: Safety: Goal: Ability to remain free from injury will improve Outcome: Progressing   Problem: Skin Integrity: Goal: Risk for impaired skin integrity will decrease Outcome: Progressing   Problem: Activity: Goal: Ability to tolerate increased activity will improve Outcome: Progressing   Problem: Respiratory: Goal: Ability to maintain a clear airway and adequate ventilation will improve Outcome: Progressing   Problem: Role Relationship: Goal: Method of communication will improve Outcome: Progressing   Problem: Fluid Volume: Goal: Hemodynamic stability will improve Outcome:  Progressing   Problem: Clinical Measurements: Goal: Diagnostic test results will improve Outcome: Progressing Goal: Signs and symptoms of infection will decrease Outcome: Progressing   Problem: Respiratory: Goal: Ability to maintain adequate ventilation will improve Outcome: Progressing

## 2021-05-11 NOTE — Progress Notes (Signed)
ANTICOAGULATION CONSULT NOTE   Pharmacy Consult for heparin Indication: atrial fibrillation  No Known Allergies  Patient Measurements: Height: '5\' 1"'$  (154.9 cm) Weight: 60.5 kg (133 lb 6.1 oz) IBW/kg (Calculated) : 47.8 Heparin Dosing Weight: TBW  Vital Signs: Temp: 99.4 F (37.4 C) (07/30 1159) Temp Source: Oral (07/30 0804) BP: 145/76 (07/30 1300) Pulse Rate: 89 (07/30 1300)  Labs: Recent Labs    05/09/21 0405 05/09/21 1600 05/10/21 0041 05/10/21 1634 05/11/21 0612 05/11/21 0909 05/11/21 1200  HGB 9.8*  --  9.2*  --  7.5*  --   --   HCT 29.8*  --  26.8*  --  21.9*  --   --   PLT 70*  --  92*  --  129*  --   --   HEPARINUNFRC  --    < > 0.12* 0.38  --   --  0.46  CREATININE 0.95  --  0.78  --  0.74 0.73  --    < > = values in this interval not displayed.     Estimated Creatinine Clearance: 47.6 mL/min (by C-G formula based on SCr of 0.73 mg/dL).   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 Post-op experienced a fib. Pharmacy dosing heparin with low heparin goal due to recent procedures and  thrombocytopenia. -heparin level = 0.46 on heparin drip 1200 units/hr  Goal of Therapy:  Heparin level goal of 0.3-0.5 given low platelets  Monitor platelets by anticoagulation protocol: Yes   Plan:  Continue heparin drip 1200 uts/hr  -Heparin level daily wth CBC daily  Hildred Laser, PharmD Clinical Pharmacist **Pharmacist phone directory can now be found on [email or web address] ([email or web address]).  Listed under [place].

## 2021-05-11 NOTE — Progress Notes (Signed)
Attempted to ambulate pt to chair, pt refuses at this time, denies pain, states she's  just tired and wants to stay in bed, encouraged pt to use incentive spirometry, watched pt do x10.

## 2021-05-11 NOTE — Progress Notes (Signed)
6 Days Post-Op Procedure(s) (LRB): THORACOTOMY MAJOR REPAIR PERFORATED ESOPHAGUS (Right) Subjective: C/o pain when she moves, denies nausea  Objective: Vital signs in last 24 hours: Temp:  [97.6 F (36.4 C)-99 F (37.2 C)] 99 F (37.2 C) (07/30 0804) Pulse Rate:  [63-89] 75 (07/30 0700) Cardiac Rhythm: Normal sinus rhythm (07/30 0400) Resp:  [22-35] 31 (07/30 0700) BP: (133-162)/(58-80) 139/74 (07/30 0600) SpO2:  [90 %-100 %] 99 % (07/30 0700)  Hemodynamic parameters for last 24 hours:    Intake/Output from previous day: 07/29 0701 - 07/30 0700 In: 2726.8 [I.V.:2196.7; IV Piggyback:530.1] Out: 1860 [Urine:1850; Chest Tube:10] Intake/Output this shift: No intake/output data recorded.  General appearance: alert, cooperative, and no distress Neurologic: intact Heart: regular rate and rhythm Lungs: diminished breath sounds bibasilar Abdomen: normal findings: soft, non-tender No air leak, serosanguinous drainage from CT  Lab Results: Recent Labs    05/10/21 0041 05/11/21 0612  WBC 18.6* 26.6*  HGB 9.2* 7.5*  HCT 26.8* 21.9*  PLT 92* 129*   BMET:  Recent Labs    05/09/21 0405 05/10/21 0041  NA 141 142  K 3.5 3.9  CL 108 110  CO2 26 25  GLUCOSE 197* 205*  BUN 24* 25*  CREATININE 0.95 0.78  CALCIUM 8.9 9.0    PT/INR: No results for input(s): LABPROT, INR in the last 72 hours. ABG    Component Value Date/Time   PHART 7.415 05/06/2021 0113   HCO3 27.8 05/07/2021 0516   TCO2 29 05/07/2021 0516   ACIDBASEDEF 14.0 (H) 05/04/2021 2049   O2SAT 96.0 05/07/2021 0516   CBG (last 3)  Recent Labs    05/10/21 2339 05/11/21 0347 05/11/21 0751  GLUCAP 195* 170* 178*    Assessment/Plan: S/P Procedure(s) (LRB): THORACOTOMY MAJOR REPAIR PERFORATED ESOPHAGUS (Right) - NEURO_ intact CV- in SR, a little hypertensive- on iV lopressor  On heparin for A fib RESP- IS for atelectasis RENAL- creatinine and lytes OK ENDO- CBG elevated on TPN, will start levemir GI-  NPO. Swallow on Monday ID- afebrile but WBC up to 24 K, does not appear septic  On Unasyn and Eraxis DVT prophylaxis- SCD, on heparin drip  LOS: 7 days    [place] Nakayama 05/11/2021

## 2021-05-11 NOTE — Progress Notes (Signed)
      [address]       ,[place] [address]             [phone]      Asleep currently BP (!) 141/69   Pulse 82   Temp 99.1 F (37.3 C)   Resp (!) 28   Ht '5\' 1"'$  (1.549 m)   Wt 60.5 kg   SpO2 99%   BMI 25.20 kg/m   Intake/Output Summary (Last 24 hours) at 05/11/2021 1847 Last data filed at 05/11/2021 1800 Gross per 24 hour  Intake 4110.96 ml  Output 2215 ml  Net 1895.96 ml   Hungry per RN Refused to walk earlier today  Continue current care  Liberty C. Roxan Hockey, MD [facility] [phone]

## 2021-05-11 NOTE — Progress Notes (Signed)
NAME:  Dana Coleman, MRN:  [id number], DOB:  03-12-1941, LOS: 7 ADMISSION DATE:  05/04/2021, CONSULTATION DATE:  7/23 REFERRING MD:  Ralene Bathe, CHIEF COMPLAINT:  Dyspnea   History of Present Illness:  80 y/o female admitted for empyema after a lap chole, noted to have esophageal perforation.  Pertinent  Medical History  DM2 CAD Hypertension Hyperlipidemia Prior R breast cancer  Significant Hospital Events: Including procedures, antibiotic start and stop dates in addition to other pertinent events   7/23 admitted intubated. Pigtail by CCS in ED -- interestingly seems to have bilious outpt 7/24 Starting CRRT. Abundant GPCs in pleural fluid 7/24 1.  Right thoracotomy 2.  Repair of esophageal perforation with intercostal pedicled muscle flap. 3.  Drainage of empyema and decortication of right lung 7/25 early AM EKG changes, trop leak, probable inferiror NSTEMI, not candidate for intervention.  Echo with mildly reduced LV, large RV ?cause 7/26 extubated 7/29 up out of bed, rising WBC  Interim History / Subjective:   Still has pain with cough One isolated very high glucose reading overnight, otherwise normal Denies abdominal pain Denies diarrhea Has a rare cough  Objective   Blood pressure 139/74, pulse 75, temperature 99 F (37.2 C), temperature source Oral, resp. rate (!) 31, height '5\' 1"'$  (1.549 m), weight 60.5 kg, SpO2 99 %.        Intake/Output Summary (Last 24 hours) at 05/11/2021 0916 Last data filed at 05/11/2021 0800 Gross per 24 hour  Intake 2818.7 ml  Output 1860 ml  Net 958.7 ml   Filed Weights   05/04/21 1625 05/10/21 0500  Weight: 58 kg 60.5 kg    Examination:  General:  Resting comfortably in bed HENT: NCAT OP clear PULM: Few crackles R base, otherwise clear, normal effort CV: RRR, no mgr GI: BS+, soft, nontender MSK: normal bulk and tone Neuro: awake, alert, no distress, MAEW   7/28 CXR personally reviewed, no infiltrate 7/29 EKG> flutter, non  specific ST wave changes  Resolved Hospital Problem list     Assessment & Plan:  Empysema, polymicrobial due to esophageal perforation Continue unasyn and eraxis  Pain post op Continue IV tylenol Avoid narcotics, over sedating  AKI: resolved Monitor BMET and UOP Replace electrolytes as needed  Rising WBC, no clear cause, drains in good position, slight cough; abdominal exam OK; lines look OK, drains look OK, no diarrhea Continue antibiotics Check blood cultures Check urinalysis CT chest/ab/pelvis 7/31 if WBC still climbing Given otherwise complete improvement will not change antibiotics yet  Esophageal perforation, s/p repair NPO Barium esophogram on Monday 8/1  Physical deconditioning Out of bed PT following  NSTEMI with WMA and dilated RV Tele Supportive care  Atrial flutter Heparin infusion to continue Tele monitoring  Hyperglycemia with DM2; one isolated elevation this morning Continue SSI Monitor glucose Long acting added by TCTS this morning  Best Practice (right click and "Reselect all SmartList Selections" daily)   Diet/type: NPO and TPN DVT prophylaxis: systemic heparin GI prophylaxis: N/A Lines: Central line and yes and it is still needed Foley:  N/A Code Status:  full code Last date of multidisciplinary goals of care discussion [n/a]  Labs   CBC: Recent Labs  Lab 05/04/21 1632 05/04/21 1643 05/08/21 0345 05/08/21 0456 05/09/21 0405 05/10/21 0041 05/11/21 0612  WBC 12.3*   < > 10.6* 11.6* 13.6* 18.6* 26.6*  NEUTROABS 9.5*  --  8.9*  --   --   --  23.5*  HGB 10.8*   < >  9.0* 9.5* 9.8* 9.2* 7.5*  HCT 37.3   < > 26.9* 28.4* 29.8* 26.8* 21.9*  MCV 104.2*   < > 90.6 89.0 89.2 89.9 90.9  PLT 168   < > 66* 75* 70* 92* 129*   < > = values in this interval not displayed.    Basic Metabolic Panel: Recent Labs  Lab 05/08/21 0456 05/08/21 1530 05/09/21 0405 05/10/21 0041 05/11/21 0612  NA 140 140 141 142 139  K 3.5 4.0 3.5 3.9 5.2*   CL 105 108 108 110 110  CO2 '28 29 26 25 23  '$ GLUCOSE 207* 231* 197* 205* 627*  BUN 30* 27* 24* 25* 29*  CREATININE 1.25* 1.04* 0.95 0.78 0.74  CALCIUM 8.4* 8.7* 8.9 9.0 8.4*  MG 2.3 2.0 2.0 1.6* 2.0  PHOS 2.4* 3.2 2.8 3.8 6.3*   GFR: Estimated Creatinine Clearance: 47.6 mL/min (by C-G formula based on SCr of 0.74 mg/dL). Recent Labs  Lab 05/04/21 1637 05/04/21 1912 05/04/21 2155 05/05/21 0001 05/08/21 0456 05/09/21 0405 05/10/21 0041 05/11/21 0612  WBC  --   --   --    < > 11.6* 13.6* 18.6* 26.6*  LATICACIDVEN >11.0* >11.0* >11.0*  --   --   --   --   --    < > = values in this interval not displayed.    Liver Function Tests: Recent Labs  Lab 05/08/21 0456 05/08/21 1530 05/09/21 0405 05/10/21 0041 05/11/21 0612  AST 140* 106* 89* 53* 39  ALT 188* 160* 138* 92* 54*  ALKPHOS 91 87 84 74 62  BILITOT 0.7 0.6 0.6 0.4 0.3  PROT 5.0* 5.2* 5.3* 5.2* 5.0*  ALBUMIN 1.8* 1.8* 1.9* 1.7* 1.5*   No results for input(s): LIPASE, AMYLASE in the last 168 hours. No results for input(s): AMMONIA in the last 168 hours.  ABG    Component Value Date/Time   PHART 7.415 05/06/2021 0113   PCO2ART 42.1 05/06/2021 0113   PO2ART 105 05/06/2021 0113   HCO3 27.8 05/07/2021 0516   TCO2 29 05/07/2021 0516   ACIDBASEDEF 14.0 (H) 05/04/2021 2049   O2SAT 96.0 05/07/2021 0516     Coagulation Profile: Recent Labs  Lab 05/05/21 0001 05/05/21 0803  INR 1.9* 1.9*    Cardiac Enzymes: No results for input(s): CKTOTAL, CKMB, CKMBINDEX, TROPONINI in the last 168 hours.  HbA1C: Hemoglobin A1C  Date/Time Value Ref Range Status  12/18/2020 10:20 AM 6.8 (A) 4.0 - 5.6 % Final  08/14/2020 01:35 PM 7.2 (A) 4.0 - 5.6 % Final   HbA1c, POC (controlled diabetic range)  Date/Time Value Ref Range Status  05/15/2020 03:27 PM 8.2 (A) 0.0 - 7.0 % Final  10/28/2019 02:30 PM 8.2 (A) 0.0 - 7.0 % Final   Hgb A1c MFr Bld  Date/Time Value Ref Range Status  04/26/2021 11:21 AM 7.4 (H) 4.8 - 5.6 % Final     Comment:    (NOTE) Pre diabetes:          5.7%-6.4%  Diabetes:              >6.4%  Glycemic control for   <7.0% adults with diabetes     CBG: Recent Labs  Lab 05/10/21 1549 05/10/21 1924 05/10/21 2339 05/11/21 0347 05/11/21 0751  GLUCAP 183* 187* 195* 170* 178*    Critical care time: 30 minutes    Roselie Awkward, MD [facility] Pager: [phone] Cell: [phone] After 7:00 pm call [place]  [phone]

## 2021-05-12 ENCOUNTER — Inpatient Hospital Stay (HOSPITAL_COMMUNITY): Payer: Medicare Other

## 2021-05-12 DIAGNOSIS — J9601 Acute respiratory failure with hypoxia: Secondary | ICD-10-CM | POA: Diagnosis not present

## 2021-05-12 DIAGNOSIS — N179 Acute kidney failure, unspecified: Secondary | ICD-10-CM | POA: Diagnosis not present

## 2021-05-12 DIAGNOSIS — Z9689 Presence of other specified functional implants: Secondary | ICD-10-CM | POA: Diagnosis not present

## 2021-05-12 LAB — CBC WITH DIFFERENTIAL/PLATELET
Abs Immature Granulocytes: 0.59 10*3/uL — ABNORMAL HIGH (ref 0.00–0.07)
Basophils Absolute: 0.1 10*3/uL (ref 0.0–0.1)
Basophils Relative: 0 %
Eosinophils Absolute: 0.2 10*3/uL (ref 0.0–0.5)
Eosinophils Relative: 1 %
HCT: 24.9 % — ABNORMAL LOW (ref 36.0–46.0)
Hemoglobin: 8.3 g/dL — ABNORMAL LOW (ref 12.0–15.0)
Immature Granulocytes: 2 %
Lymphocytes Relative: 4 %
Lymphs Abs: 1 10*3/uL (ref 0.7–4.0)
MCH: 30 pg (ref 26.0–34.0)
MCHC: 33.3 g/dL (ref 30.0–36.0)
MCV: 89.9 fL (ref 80.0–100.0)
Monocytes Absolute: 1.6 10*3/uL — ABNORMAL HIGH (ref 0.1–1.0)
Monocytes Relative: 6 %
Neutro Abs: 25.3 10*3/uL — ABNORMAL HIGH (ref 1.7–7.7)
Neutrophils Relative %: 87 %
Platelets: 134 10*3/uL — ABNORMAL LOW (ref 150–400)
RBC: 2.77 MIL/uL — ABNORMAL LOW (ref 3.87–5.11)
RDW: 15.9 % — ABNORMAL HIGH (ref 11.5–15.5)
WBC: 28.8 10*3/uL — ABNORMAL HIGH (ref 4.0–10.5)
nRBC: 0.2 % (ref 0.0–0.2)

## 2021-05-12 LAB — COMPREHENSIVE METABOLIC PANEL
ALT: 48 U/L — ABNORMAL HIGH (ref 0–44)
AST: 38 U/L (ref 15–41)
Albumin: 1.5 g/dL — ABNORMAL LOW (ref 3.5–5.0)
Alkaline Phosphatase: 54 U/L (ref 38–126)
Anion gap: 11 (ref 5–15)
BUN: 32 mg/dL — ABNORMAL HIGH (ref 8–23)
CO2: 19 mmol/L — ABNORMAL LOW (ref 22–32)
Calcium: 8.5 mg/dL — ABNORMAL LOW (ref 8.9–10.3)
Chloride: 118 mmol/L — ABNORMAL HIGH (ref 98–111)
Creatinine, Ser: 0.9 mg/dL (ref 0.44–1.00)
GFR, Estimated: >60 mL/min (ref >60)
Glucose, Bld: 184 mg/dL — ABNORMAL HIGH (ref 70–99)
Potassium: 3.9 mmol/L (ref 3.5–5.1)
Sodium: 148 mmol/L — ABNORMAL HIGH (ref 135–145)
Total Bilirubin: 0.3 mg/dL (ref 0.3–1.2)
Total Protein: 5.1 g/dL — ABNORMAL LOW (ref 6.5–8.1)

## 2021-05-12 LAB — GLUCOSE, CAPILLARY
Glucose-Capillary: 133 mg/dL — ABNORMAL HIGH (ref 70–99)
Glucose-Capillary: 155 mg/dL — ABNORMAL HIGH (ref 70–99)
Glucose-Capillary: 167 mg/dL — ABNORMAL HIGH (ref 70–99)
Glucose-Capillary: 168 mg/dL — ABNORMAL HIGH (ref 70–99)
Glucose-Capillary: 171 mg/dL — ABNORMAL HIGH (ref 70–99)
Glucose-Capillary: 171 mg/dL — ABNORMAL HIGH (ref 70–99)
Glucose-Capillary: 181 mg/dL — ABNORMAL HIGH (ref 70–99)

## 2021-05-12 LAB — MAGNESIUM: Magnesium: 2.1 mg/dL (ref 1.7–2.4)

## 2021-05-12 LAB — HEPARIN LEVEL (UNFRACTIONATED): Heparin Unfractionated: 0.53 IU/mL (ref 0.30–0.70)

## 2021-05-12 MED ORDER — ACETAMINOPHEN 10 MG/ML IV SOLN
1000.0000 mg | Freq: Four times a day (QID) | INTRAVENOUS | Status: AC | PRN
  Administered 2021-05-12 – 2021-05-13 (×2): 1000 mg via INTRAVENOUS
  Filled 2021-05-12 (×2): qty 100

## 2021-05-12 MED ORDER — TRAVASOL 10 % IV SOLN
INTRAVENOUS | Status: AC
  Filled 2021-05-12: qty 1113.6

## 2021-05-12 MED ORDER — SODIUM CHLORIDE 0.9 % IV SOLN
2.0000 g | Freq: Two times a day (BID) | INTRAVENOUS | Status: DC
  Administered 2021-05-12 – 2021-05-15 (×7): 2 g via INTRAVENOUS
  Filled 2021-05-12 (×10): qty 2

## 2021-05-12 MED ORDER — ACETAMINOPHEN 10 MG/ML IV SOLN: 1000.0000 mg | Freq: Four times a day (QID) | INTRAVENOUS | Status: DC | PRN

## 2021-05-12 NOTE — Progress Notes (Signed)
7 Days Post-Op Procedure(s) (LRB): THORACOTOMY MAJOR REPAIR PERFORATED ESOPHAGUS (Right) Subjective: C/o incisional pain  Objective: Vital signs in last 24 hours: Temp:  [97.7 F (36.5 C)-99.4 F (37.4 C)] 98.1 F (36.7 C) (07/31 0755) Pulse Rate:  [65-95] 95 (07/31 0800) Cardiac Rhythm: Normal sinus rhythm (07/31 0800) Resp:  [20-31] 27 (07/31 0800) BP: (98-157)/(58-87) 134/74 (07/31 0800) SpO2:  [97 %-100 %] 97 % (07/31 0800)  Hemodynamic parameters for last 24 hours:    Intake/Output from previous day: 07/30 0701 - 07/31 0700 In: 2504.5 [I.V.:1922.6; IV Piggyback:581.9] Out: 2355 [Urine:2200; Chest Tube:155] Intake/Output this shift: Total I/O In: -  Out: 50 [Urine:50]  General appearance: alert, cooperative, and no distress Neurologic: intact Heart: regular rate and rhythm Lungs: diminished breath sounds bibasilar Abdomen: normal findings: soft, non-tender Serosanguinous drainage from CT  Lab Results: Recent Labs    05/11/21 0612 05/12/21 0138  WBC 26.6* 28.8*  HGB 7.5* 8.3*  HCT 21.9* 24.9*  PLT 129* 134*   BMET:  Recent Labs    05/11/21 0909 05/12/21 0138  NA 145 148*  K 3.9 3.9  CL 114* 118*  CO2 21* 19*  GLUCOSE 204* 184*  BUN 31* 32*  CREATININE 0.73 0.90  CALCIUM 8.9 8.5*    PT/INR: No results for input(s): LABPROT, INR in the last 72 hours. ABG    Component Value Date/Time   PHART 7.415 05/06/2021 0113   HCO3 27.8 05/07/2021 0516   TCO2 29 05/07/2021 0516   ACIDBASEDEF 14.0 (H) 05/04/2021 2049   O2SAT 96.0 05/07/2021 0516   CBG (last 3)  Recent Labs    05/12/21 0007 05/12/21 0356 05/12/21 0745  GLUCAP 167* 155* 181*    Assessment/Plan: S/P Procedure(s) (LRB): THORACOTOMY MAJOR REPAIR PERFORATED ESOPHAGUS (Right) POD # 7 repair esophageal perforation CV- remains in SR  On heparin for A fib RESP- O2 sat OK on 2 L [place] RENAL- creatinine stable ENDO- CBG mildly elevated on TPN- pharmacy adjusting heparin GI- NPO, for swallow  tomorrow Deconditioning - encouraged ambulation Leukocytosis- WBC remains elevated, no fevers, no obvious source  Continue to monitor  On Unasyn  LOS: 8 days    [place] Nakayama 05/12/2021

## 2021-05-12 NOTE — Progress Notes (Signed)
[place] for heparin Indication: atrial fibrillation  No Known Allergies  Patient Measurements: Height: '5\' 1"'$  (154.9 cm) Weight: 60.5 kg (133 lb 6.1 oz) IBW/kg (Calculated) : 47.8 Heparin Dosing Weight: TBW  Vital Signs: Temp: 98.1 F (36.7 C) (07/31 0755) Temp Source: Oral (07/31 0755) BP: 134/74 (07/31 0800) Pulse Rate: 95 (07/31 0800)  Labs: Recent Labs    05/10/21 0041 05/10/21 1634 05/11/21 0612 05/11/21 0909 05/11/21 1200 05/12/21 0138  HGB 9.2*  --  7.5*  --   --  8.3*  HCT 26.8*  --  21.9*  --   --  24.9*  PLT 92*  --  129*  --   --  134*  HEPARINUNFRC 0.12* 0.38  --   --  0.46 0.53  CREATININE 0.78  --  0.74 0.73  --  0.90     Estimated Creatinine Clearance: 42.3 mL/min (by C-G formula based on SCr of 0.9 mg/dL).   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 Post-op experienced a fib. Pharmacy dosing heparin with low heparin goal due to recent procedures and thrombocytopenia. -heparin level = 0.53 on heparin drip 1200 units/hr  Goal of Therapy:  Heparin level goal of 0.3-0.5 given low platelets   Monitor platelets by anticoagulation protocol: Yes   Plan:  -Decrease heparin to 1150 units/hr -Heparin level daily wth CBC daily  Hildred Laser, PharmD Clinical Pharmacist **Pharmacist phone directory can now be found on [email or web address] ([email or web address]).  Listed under [place].

## 2021-05-12 NOTE — Progress Notes (Signed)
PHARMACY - TOTAL PARENTERAL NUTRITION CONSULT NOTE  Indication: s/p esophageal perforation and repair  Patient Measurements: Height: '5\' 1"'$  (154.9 cm) Weight: 60.5 kg (133 lb 6.1 oz) IBW/kg (Calculated) : 47.8   Body mass index is 25.2 kg/m.  Assessment:  80 y/o female with recent lap chole for symptomatic cholelithiasis on 7/21. PMH including HTN, HLD, DM, R breast cancer, and CAD. Patient presented on 7/23 with AMS and unresponsiveness - was complaining of abdominal discomfort and then patient became unresponsive and slumped on table.  S/p gall bladder surgery and esophageal perforation-repaired 7/24.  Pharmacy consulted for TPN management.  Glucose / Insulin: DM with A1c 7.4% on Lantus 24/d PTA. CBGs < 180. Utilized 22 units SSI in 24 hrs + 55 units insulin in TPN Electrolytes: high Na/CL 148/118, low CO2, CoCa down to 10.5 (no Ca in TPN), Mag 2.1 post 2gm, others WNL Renal: septic ATN s/p CRRT (end 7/25) - SCr < 1, BUN 29 Hepatic: LFTs normalizing, tbili / WNL, albumin 1.5, prealbumin 6.1, TG mildly elevated at 180 Intake / Output; MIVF:  UOP 1.5 mL/kg/hr, chest tube 112m, net +2.1L GI Imaging: - 7/23 CT: no acute abnormalities  GI Surgeries / Procedures:  - 7/21: s/p laparoscopic cholecystectomy and LOA - 7/24: s/p R thoracotomy, repair of esophageal perf with muscle flap, drainage of empyema and decortication of R lung  Central access: PICC 7/26 (double lumen) TPN start date: 05/07/21  Nutritional Goals (per RD rec on 7/26): kCal: 1750-2030 , Protein: 87-116 , Fluid: >=1.8L  Current Nutrition:  TPN  Plan:  Continue TPN at goal rate of 80 ml/hr to provide 111g AA, 230g CHO and 58g ILE for a total of 1805 kCal, meeting 100% of patient's needs Electrolytes in TPN: reduce Na 089m/L, K 3066mL, Ca 0mE30m since 7/30, Mg 5mEq39m Phos 20mmo55m change to max acetate Add standard MVI and trace elements to TPN Continue CVTS SSI Q4H and increase regular insulin in TPN to 65  units Monitor TPN labs on Mon/Thurs  Plan for swallow eval on 8/1  Ophia Shamoon D. Desire Fulp, Mina MarblemD, BCPS, BCCCP [email or web address], 8:07 AM

## 2021-05-12 NOTE — Progress Notes (Signed)
      [address]       [place] [address]             [phone]      Asleep currently  BP 134/74 (BP Location: Right Wrist)   Pulse 95   Temp 98.3 F (36.8 C) (Oral)   Resp (!) 27   Ht '5\' 1"'$  (1.549 m)   Wt 60.5 kg   SpO2 97%   BMI 25.20 kg/m  In SR  Intake/Output Summary (Last 24 hours) at 05/12/2021 1641 Last data filed at 05/12/2021 0800 Gross per 24 hour  Intake 1402.66 ml  Output 1300 ml  Net 102.66 ml    Antibiotics changed to Meropenem + Eraxis  Swallow tomorrow  Remo Lipps C. Roxan Hockey, MD [facility] [phone]

## 2021-05-12 NOTE — Progress Notes (Signed)
Pharmacy Antibiotic Note  Dana Coleman is a 80 y.o. female with esophageal perforation s/p OR on 7/24. She is on Unasyn. Pleural fluide noted with streptococcus salivarius and candida glabrata. She is noted with increasing WBC and worsening CXR and antibiotics to change to meropenem -WBC= 28.8, SCr= 0.9  Plan: Meropenem 2gm IV q12h Watch renal function, cultures and clinical course.  Height: '5\' 1"'$  (154.9 cm) Weight: 60.5 kg (133 lb 6.1 oz) IBW/kg (Calculated) : 47.8  Temp (24hrs), Avg:98.5 F (36.9 C), Min:97.7 F (36.5 C), Max:99.4 F (37.4 C)  Recent Labs  Lab 05/08/21 0456 05/08/21 1530 05/09/21 0405 05/10/21 0041 05/11/21 0612 05/11/21 0909 05/12/21 0138  WBC 11.6*  --  13.6* 18.6* 26.6*  --  28.8*  CREATININE 1.25*   < > 0.95 0.78 0.74 0.73 0.90   < > = values in this interval not displayed.     Estimated Creatinine Clearance: 42.3 mL/min (by C-G formula based on SCr of 0.9 mg/dL).    No Known Allergies  Meropenem 7/31>> Unasyn 7/26>7/31 Zosyn 7/23 > 7/26 Eraxis 7/23 >  7/30 blood x2 7/24 blood x2 > neg 7/23 pleural fluid > sterp salivarius (pan S)  Hildred Laser, PharmD Clinical Pharmacist **Pharmacist phone directory can now be found on [email or web address] ([email or web address]).  Listed under [place].

## 2021-05-12 NOTE — Progress Notes (Signed)
NAME:  Dana Coleman, MRN:  [id number], DOB:  1940-12-25, LOS: 8 ADMISSION DATE:  05/04/2021, CONSULTATION DATE:  7/23 REFERRING MD:  Ralene Bathe, CHIEF COMPLAINT:  Dyspnea   History of Present Illness:  80 y/o female admitted for empyema after a lap chole, noted to have esophageal perforation.  Pertinent  Medical History  DM2 CAD Hypertension Hyperlipidemia Prior R breast cancer  Significant Hospital Events: Including procedures, antibiotic start and stop dates in addition to other pertinent events   7/23 admitted intubated. Pigtail by CCS in ED -- interestingly seems to have bilious outpt 7/24 Starting CRRT. Abundant GPCs in pleural fluid 7/24 1.  Right thoracotomy 2.  Repair of esophageal perforation with intercostal pedicled muscle flap. 3.  Drainage of empyema and decortication of right lung 7/25 early AM EKG changes, trop leak, probable inferiror NSTEMI, not candidate for intervention.  Echo with mildly reduced LV, large RV ?cause 7/26 extubated 7/29 up out of bed, rising WBC 7/30 blood culture >  7/31 WBC up again, cough with mucus production, chest pain  Interim History / Subjective:   7/31 WBC up again, cough with mucus production, chest pain Refusing to work with PT Chest tube drainage the same  Objective   Blood pressure 134/74, pulse 95, temperature 98.1 F (36.7 C), temperature source Oral, resp. rate (!) 27, height '5\' 1"'$  (1.549 m), weight 60.5 kg, SpO2 97 %.        Intake/Output Summary (Last 24 hours) at 05/12/2021 1024 Last data filed at 05/12/2021 0800 Gross per 24 hour  Intake 2175.37 ml  Output 2030 ml  Net 145.37 ml   Filed Weights   05/04/21 1625 05/10/21 0500  Weight: 58 kg 60.5 kg    Examination:  General:  Resting comfortably in bed HENT: NCAT OP clear PULM: Rhonchi R base B, normal effort CV: RRR, no mgr GI: BS+, soft, nontender MSK: normal bulk and tone Neuro: awake, alert, no distress, MAEW    7/31 CXR > increased interstitial  markings left base, chest tubes in place  7/29 EKG> flutter, non specific ST wave changes  Resolved Hospital Problem list     Assessment & Plan:  Empyema, polymicrobial due to esophageal perforation Continue eraxis (could d/c on 8/1 if blood cultures negative) Stop unasyn Broaden to meropenem Plan barium swallow (esophogram) on 8/1, consider   Pain post op Continue IV tylenol Avoid narcotics  AKI: resolved Monitor BMET and UOP Replace electrolytes as needed  Rising WBC 7/31 concern for pneumonia with cough with mucus production, increased interstitial markings on R in CXR Change to meropenem Continue eraxis today, f/u blood cultures F/u blood cultures F/u barium esophogram on 8/1  Esophageal perforation, s/p repair NPO Barium esophogram on 8/1  Physical deconditioning Out of bed PT following  NSTEMI with WMA and dilated RV Tele Supportive care  Atrial flutter Heparin tele  Hyperglycemia with DM2; one isolated elevation this morning SSI lantus  Best Practice (right click and "Reselect all SmartList Selections" daily)   Diet/type: NPO and TPN DVT prophylaxis: systemic heparin GI prophylaxis: N/A Lines: Central line and yes and it is still needed Foley:  N/A Code Status:  full code Last date of multidisciplinary goals of care discussion [n/a]  Labs   CBC: Recent Labs  Lab 05/08/21 0345 05/08/21 0456 05/09/21 0405 05/10/21 0041 05/11/21 0612 05/12/21 0138  WBC 10.6* 11.6* 13.6* 18.6* 26.6* 28.8*  NEUTROABS 8.9*  --   --   --  23.5* 25.3*  HGB 9.0* 9.5* 9.8*  9.2* 7.5* 8.3*  HCT 26.9* 28.4* 29.8* 26.8* 21.9* 24.9*  MCV 90.6 89.0 89.2 89.9 90.9 89.9  PLT 66* 75* 70* 92* 129* 134*    Basic Metabolic Panel: Recent Labs  Lab 05/08/21 1530 05/09/21 0405 05/10/21 0041 05/11/21 0612 05/11/21 0909 05/12/21 0138  NA 140 141 142 139 145 148*  K 4.0 3.5 3.9 5.2* 3.9 3.9  CL 108 108 110 110 114* 118*  CO2 '29 26 25 23 '$ 21* 19*  GLUCOSE 231* 197*  205* 627* 204* 184*  BUN 27* 24* 25* 29* 31* 32*  CREATININE 1.04* 0.95 0.78 0.74 0.73 0.90  CALCIUM 8.7* 8.9 9.0 8.4* 8.9 8.5*  MG 2.0 2.0 1.6* 2.0 1.8 2.1  PHOS 3.2 2.8 3.8 6.3* 3.4  --    GFR: Estimated Creatinine Clearance: 42.3 mL/min (by C-G formula based on SCr of 0.9 mg/dL). Recent Labs  Lab 05/09/21 0405 05/10/21 0041 05/11/21 0612 05/12/21 0138  WBC 13.6* 18.6* 26.6* 28.8*    Liver Function Tests: Recent Labs  Lab 05/08/21 1530 05/09/21 0405 05/10/21 0041 05/11/21 0612 05/12/21 0138  AST 106* 89* 53* 39 38  ALT 160* 138* 92* 54* 48*  ALKPHOS 87 84 74 62 54  BILITOT 0.6 0.6 0.4 0.3 0.3  PROT 5.2* 5.3* 5.2* 5.0* 5.1*  ALBUMIN 1.8* 1.9* 1.7* 1.5* 1.5*   No results for input(s): LIPASE, AMYLASE in the last 168 hours. No results for input(s): AMMONIA in the last 168 hours.  ABG    Component Value Date/Time   PHART 7.415 05/06/2021 0113   PCO2ART 42.1 05/06/2021 0113   PO2ART 105 05/06/2021 0113   HCO3 27.8 05/07/2021 0516   TCO2 29 05/07/2021 0516   ACIDBASEDEF 14.0 (H) 05/04/2021 2049   O2SAT 96.0 05/07/2021 0516     Coagulation Profile: No results for input(s): INR, PROTIME in the last 168 hours.   Cardiac Enzymes: No results for input(s): CKTOTAL, CKMB, CKMBINDEX, TROPONINI in the last 168 hours.  HbA1C: Hemoglobin A1C  Date/Time Value Ref Range Status  12/18/2020 10:20 AM 6.8 (A) 4.0 - 5.6 % Final  08/14/2020 01:35 PM 7.2 (A) 4.0 - 5.6 % Final   HbA1c, POC (controlled diabetic range)  Date/Time Value Ref Range Status  05/15/2020 03:27 PM 8.2 (A) 0.0 - 7.0 % Final  10/28/2019 02:30 PM 8.2 (A) 0.0 - 7.0 % Final   Hgb A1c MFr Bld  Date/Time Value Ref Range Status  04/26/2021 11:21 AM 7.4 (H) 4.8 - 5.6 % Final    Comment:    (NOTE) Pre diabetes:          5.7%-6.4%  Diabetes:              >6.4%  Glycemic control for   <7.0% adults with diabetes     CBG: Recent Labs  Lab 05/11/21 1559 05/11/21 1917 05/12/21 0007 05/12/21 0356  05/12/21 0745  GLUCAP 154* 173* 167* 155* 181*    Critical care time: 32 minutes    Roselie Awkward, MD  PCCM Pager: [phone] Cell: [phone] After 7:00 pm call Elink  [phone]

## 2021-05-12 NOTE — Plan of Care (Signed)
  Problem: Education: Goal: Knowledge of General Education information will improve Description: Including pain rating scale, medication(s)/side effects and non-pharmacologic comfort measures Outcome: Progressing   Problem: Health Behavior/Discharge Planning: Goal: Ability to manage health-related needs will improve Outcome: Progressing   Problem: Clinical Measurements: Goal: Ability to maintain clinical measurements within normal limits will improve Outcome: Progressing Goal: Will remain free from infection Outcome: Progressing Goal: Diagnostic test results will improve Outcome: Progressing Goal: Respiratory complications will improve Outcome: Progressing Goal: Cardiovascular complication will be avoided Outcome: Progressing   Problem: Activity: Goal: Risk for activity intolerance will decrease Outcome: Progressing   Problem: Nutrition: Goal: Adequate nutrition will be maintained Outcome: Progressing   Problem: Coping: Goal: Level of anxiety will decrease Outcome: Progressing   Problem: Elimination: Goal: Will not experience complications related to bowel motility Outcome: Progressing Goal: Will not experience complications related to urinary retention Outcome: Progressing   Problem: Pain Managment: Goal: General experience of comfort will improve Outcome: Progressing   Problem: Safety: Goal: Ability to remain free from injury will improve Outcome: Progressing   Problem: Skin Integrity: Goal: Risk for impaired skin integrity will decrease Outcome: Progressing   Problem: Activity: Goal: Ability to tolerate increased activity will improve Outcome: Progressing   Problem: Respiratory: Goal: Ability to maintain a clear airway and adequate ventilation will improve Outcome: Progressing   Problem: Role Relationship: Goal: Method of communication will improve Outcome: Progressing   Problem: Fluid Volume: Goal: Hemodynamic stability will improve Outcome:  Progressing   Problem: Clinical Measurements: Goal: Diagnostic test results will improve Outcome: Progressing Goal: Signs and symptoms of infection will decrease Outcome: Progressing   Problem: Respiratory: Goal: Ability to maintain adequate ventilation will improve Outcome: Progressing

## 2021-05-13 ENCOUNTER — Inpatient Hospital Stay (HOSPITAL_COMMUNITY): Payer: Medicare Other

## 2021-05-13 DIAGNOSIS — K223 Perforation of esophagus: Secondary | ICD-10-CM

## 2021-05-13 LAB — DIFFERENTIAL
Abs Immature Granulocytes: 0 10*3/uL (ref 0.00–0.07)
Basophils Absolute: 0 10*3/uL (ref 0.0–0.1)
Basophils Relative: 0 %
Eosinophils Absolute: 0 10*3/uL (ref 0.0–0.5)
Eosinophils Relative: 0 %
Lymphocytes Relative: 2 %
Lymphs Abs: 0.5 10*3/uL — ABNORMAL LOW (ref 0.7–4.0)
Monocytes Absolute: 1.1 10*3/uL — ABNORMAL HIGH (ref 0.1–1.0)
Monocytes Relative: 4 %
Neutro Abs: 25.3 10*3/uL — ABNORMAL HIGH (ref 1.7–7.7)
Neutrophils Relative %: 94 %

## 2021-05-13 LAB — COMPREHENSIVE METABOLIC PANEL
ALT: 44 U/L (ref 0–44)
AST: 42 U/L — ABNORMAL HIGH (ref 15–41)
Albumin: 1.5 g/dL — ABNORMAL LOW (ref 3.5–5.0)
Alkaline Phosphatase: 55 U/L (ref 38–126)
Anion gap: 9 (ref 5–15)
BUN: 36 mg/dL — ABNORMAL HIGH (ref 8–23)
CO2: 19 mmol/L — ABNORMAL LOW (ref 22–32)
Calcium: 8.5 mg/dL — ABNORMAL LOW (ref 8.9–10.3)
Chloride: 119 mmol/L — ABNORMAL HIGH (ref 98–111)
Creatinine, Ser: 0.81 mg/dL (ref 0.44–1.00)
GFR, Estimated: >60 mL/min (ref >60)
Glucose, Bld: 177 mg/dL — ABNORMAL HIGH (ref 70–99)
Potassium: 4.8 mmol/L (ref 3.5–5.1)
Sodium: 147 mmol/L — ABNORMAL HIGH (ref 135–145)
Total Bilirubin: 0.6 mg/dL (ref 0.3–1.2)
Total Protein: 5.4 g/dL — ABNORMAL LOW (ref 6.5–8.1)

## 2021-05-13 LAB — PHOSPHORUS: Phosphorus: 3.4 mg/dL (ref 2.5–4.6)

## 2021-05-13 LAB — HEPARIN LEVEL (UNFRACTIONATED): Heparin Unfractionated: 0.28 IU/mL — ABNORMAL LOW (ref 0.30–0.70)

## 2021-05-13 LAB — GLUCOSE, CAPILLARY
Glucose-Capillary: 138 mg/dL — ABNORMAL HIGH (ref 70–99)
Glucose-Capillary: 149 mg/dL — ABNORMAL HIGH (ref 70–99)
Glucose-Capillary: 155 mg/dL — ABNORMAL HIGH (ref 70–99)
Glucose-Capillary: 157 mg/dL — ABNORMAL HIGH (ref 70–99)
Glucose-Capillary: 162 mg/dL — ABNORMAL HIGH (ref 70–99)

## 2021-05-13 LAB — EXPECTORATED SPUTUM ASSESSMENT W GRAM STAIN, RFLX TO RESP C

## 2021-05-13 LAB — CBC
HCT: 22.5 % — ABNORMAL LOW (ref 36.0–46.0)
Hemoglobin: 7.6 g/dL — ABNORMAL LOW (ref 12.0–15.0)
MCH: 30.5 pg (ref 26.0–34.0)
MCHC: 33.8 g/dL (ref 30.0–36.0)
MCV: 90.4 fL (ref 80.0–100.0)
Platelets: 177 10*3/uL (ref 150–400)
RBC: 2.49 MIL/uL — ABNORMAL LOW (ref 3.87–5.11)
RDW: 16.8 % — ABNORMAL HIGH (ref 11.5–15.5)
WBC: 26.9 10*3/uL — ABNORMAL HIGH (ref 4.0–10.5)
nRBC: 0.1 % (ref 0.0–0.2)

## 2021-05-13 LAB — MAGNESIUM: Magnesium: 2.2 mg/dL (ref 1.7–2.4)

## 2021-05-13 LAB — PREPARE RBC (CROSSMATCH)

## 2021-05-13 LAB — TRIGLYCERIDES: Triglycerides: 113 mg/dL (ref <150)

## 2021-05-13 LAB — PREALBUMIN: Prealbumin: 8.8 mg/dL — ABNORMAL LOW (ref 18–38)

## 2021-05-13 MED ORDER — VANCOMYCIN HCL 1250 MG/250ML IV SOLN
1250.0000 mg | Freq: Once | INTRAVENOUS | Status: AC
  Administered 2021-05-13: 1250 mg via INTRAVENOUS
  Filled 2021-05-13: qty 250

## 2021-05-13 MED ORDER — TRAVASOL 10 % IV SOLN
INTRAVENOUS | Status: AC
  Filled 2021-05-13: qty 1113.6

## 2021-05-13 MED ORDER — VANCOMYCIN HCL IN DEXTROSE 1-5 GM/200ML-% IV SOLN
1000.0000 mg | INTRAVENOUS | Status: DC
  Administered 2021-05-14: 1000 mg via INTRAVENOUS
  Filled 2021-05-13 (×2): qty 200

## 2021-05-13 MED ORDER — DIATRIZOATE MEGLUMINE & SODIUM 66-10 % PO SOLN
100.0000 mL | Freq: Once | ORAL | Status: AC
  Administered 2021-05-13: 100 mL via ORAL
  Filled 2021-05-13: qty 120

## 2021-05-13 MED ORDER — SODIUM CHLORIDE 0.9% IV SOLUTION: Freq: Once | INTRAVENOUS | Status: AC

## 2021-05-13 MED ORDER — FENTANYL CITRATE (PF) 100 MCG/2ML IJ SOLN
12.5000 ug | INTRAMUSCULAR | Status: DC | PRN
  Administered 2021-05-13 – 2021-05-21 (×19): 25 ug via INTRAVENOUS
  Filled 2021-05-13 (×19): qty 2

## 2021-05-13 MED ORDER — INSULIN ASPART 100 UNIT/ML IJ SOLN
0.0000 [IU] | Freq: Four times a day (QID) | INTRAMUSCULAR | Status: DC
  Administered 2021-05-13: 4 [IU] via SUBCUTANEOUS
  Administered 2021-05-13 (×2): 2 [IU] via SUBCUTANEOUS
  Administered 2021-05-14: 4 [IU] via SUBCUTANEOUS
  Administered 2021-05-14 – 2021-05-15 (×3): 2 [IU] via SUBCUTANEOUS
  Administered 2021-05-15: 4 [IU] via SUBCUTANEOUS
  Administered 2021-05-15 (×2): 2 [IU] via SUBCUTANEOUS
  Administered 2021-05-17: 4 [IU] via SUBCUTANEOUS

## 2021-05-13 NOTE — Progress Notes (Signed)
Physical Therapy Treatment Patient Details Name: Dana Coleman MRN: [id number] DOB: 15-Mar-1941 Today's Date: 05/13/2021    History of Present Illness Pt is a 80 yo female admitted on 05/04/21 for AMS. HX of cholecystectomy on 7/21.  Intubated upon admission, 7/24 Started CRRT, Esophageal perforation with septic shock post flap repair, drainage of empyema and decortication of R lung with chest tube placed.  7/25 EKG changes and probable nstemi. Extubated 7/26. PMH includes: R breast CA, DM, CAD, HTN.    PT Comments    Pt demonstrated improvements in cognitive status/alertness during session. Pt demonstrated improvements with sit to stand transfer compared to previous session, requiring min A. Pt's VSS throughout session see below. Progress ambulation in upcoming sessions. Pre-vitals - HR 89, BP 126/68(85) on 5L O2, spO2 92 Post-vitals - HR 72, spO2 89 on 4L  Follow Up Recommendations  CIR     Equipment Recommendations  None recommended by PT    Recommendations for Other Services       Precautions / Restrictions Precautions Precautions: Fall Precaution Comments: R chest tube Restrictions Weight Bearing Restrictions: No    Mobility  Bed Mobility Overal bed mobility: Needs Assistance Bed Mobility: Supine to Sit     Supine to sit: Max assist;+2 for physical assistance     General bed mobility comments: Pt in bed upon arrival. Taken from 5 to 4L of O2 during session. max A +2 with assist from pads to lower LEs EOB and raise trunk. Multimodal cues provided.    Transfers Overall transfer level: Needs assistance Equipment used: Rolling walker (2 wheeled) Transfers: Sit to/from [facility] Sit to Stand: Min assist;+2 physical assistance Stand pivot transfers: Min assist;+2 physical assistance       General transfer comment: min A +2 for transfers with assist from RW, BUE support, and external support.  Ambulation/Gait             General Gait  Details: unable to assess   Stairs             Wheelchair Mobility    Modified Rankin (Stroke Patients Only)       Balance Overall balance assessment: Needs assistance Sitting-balance support: No upper extremity supported;Feet supported Sitting balance-Leahy Scale: Fair     Standing balance support: During functional activity;Bilateral upper extremity supported Standing balance-Leahy Scale: Poor Standing balance comment: relies on BUE and external support                            Cognition Arousal/Alertness: Awake/alert Behavior During Therapy: WFL for tasks assessed/performed Overall Cognitive Status: Within Functional Limits for tasks assessed Area of Impairment: Problem solving;Following commands                       Following Commands: Follows one step commands with increased time     Problem Solving: Slow processing;Requires verbal cues;Requires tactile cues General Comments: Pt follows one-step commands accurately with increased time. A&Ox4 with increased time to respond.      Exercises General Exercises - Lower Extremity Long Arc Quad: AROM;Both;10 reps;Seated Straight Leg Raises: AROM;10 reps;Both;Seated    General Comments General comments (skin integrity, edema, etc.): VSS on 4L O2 during session      Pertinent Vitals/Pain Faces Pain Scale: Hurts little more Pain Location: R flank Pain Descriptors / Indicators: Discomfort;Grimacing;Moaning Pain Intervention(s): Monitored during session;Limited activity within patient's tolerance;Repositioned    Home Living  Prior Function            PT Goals (current goals can now be found in the care plan section)      Frequency    Min 3X/week      PT Plan Current plan remains appropriate    Co-evaluation              AM-PAC PT "6 Clicks" Mobility   Outcome Measure  Help needed turning from your back to your side while in a flat bed  without using bedrails?: Total Help needed moving from lying on your back to sitting on the side of a flat bed without using bedrails?: Total Help needed moving to and from a bed to a chair (including a wheelchair)?: Total Help needed standing up from a chair using your arms (e.g., wheelchair or bedside chair)?: Total Help needed to walk in hospital room?: Total Help needed climbing 3-5 steps with a railing? : Total 6 Click Score: 6    End of Session   Activity Tolerance: Patient limited by pain;Patient limited by fatigue Patient left: in chair;with nursing/sitter in room;with call bell/phone within reach;with chair alarm set Nurse Communication: Mobility status PT Visit Diagnosis: Other abnormalities of gait and mobility (R26.89);Muscle weakness (generalized) (M62.81);Difficulty in walking, not elsewhere classified (R26.2)     Time: [id number] PT Time Calculation (min) (ACUTE ONLY): 25 min  Charges:  $Therapeutic Activity: 23-37 mins                     Louie Casa, SPT Acute Rehab: [phone] [id number]    Domingo Dimes 05/13/2021, 2:13 PM

## 2021-05-13 NOTE — Progress Notes (Signed)
Pharmacy Antibiotic Note  Dana Coleman is a 80 y.o. female admitted on 05/04/2021 with respiratory failure s/r R thoracotomy for repair of esophagus.  Patient has increasing WBCs and low grade fever despite treatment with meropenem and Eraxis. Patient was MRSA PCR negative on admit, but has been in the hospital > 1 week and now has RF for MRSA. Patient is also on TPN. Pharmacy has been consulted for vancomycin and meropenem dosing.  IBW used to calculate CrCL and Ke Vd=0.72 Scr= 0.81 (05/13/21)  Plan: Vancomycin load of 1250 mg IV x1  Vancomycin 1000 mg q 24h IV (Calculated AUC 578) Goal AUC of 400-600 Follow-up troughs as needed  Meropenem 2gm IV q12h Watch renal function, cultures and clinical course.  Height: '5\' 1"'$  (154.9 cm) Weight: 60.5 kg (133 lb 6.1 oz) IBW/kg (Calculated) : 47.8  Temp (24hrs), Avg:98.4 F (36.9 C), Min:97.8 F (36.6 C), Max:99.4 F (37.4 C)  Recent Labs  Lab 05/09/21 0405 05/10/21 0041 05/11/21 0612 05/11/21 0909 05/12/21 0138 05/13/21 0550  WBC 13.6* 18.6* 26.6*  --  28.8* 26.9*  CREATININE 0.95 0.78 0.74 0.73 0.90 0.81    Estimated Creatinine Clearance: 47 mL/min (by C-G formula based on SCr of 0.81 mg/dL).    No Known Allergies  Antimicrobials this admission: Meropenem 7/31>> Unasyn 7/26> 7/31 Zosyn 7/23 > 7/26 Eraxis 7/23 > Vanco 8/1 >>  Microbiology results: 7/30 blood x2 7/24 blood x2 > neg 7/23 pleural fluid > strep salivarius (pan S), strep mitas- sens to CTX, rare candida albicans 7/23 MRSA PCR negative  Thank you for allowing pharmacy to be a part of this patient's care.  [name] A [name] 05/13/2021 8:35 AM

## 2021-05-13 NOTE — Progress Notes (Signed)
Patient ID: Dana Coleman, female   DOB: 1941/08/08, 80 y.o.   MRN: [id number]  TCTS Evening Rounds:  Hemodynamically stable  Esophagram today showed no leak. There are signs of esophageal motility disorder and what look like pulsion diverticula. Started on water po today and tolerating so far.  Chest tube output 90 cc today, still bloody.  Transfused 1 unit prbc's today.

## 2021-05-13 NOTE — Consult Note (Signed)
[place] Nurse wound consult note Consultation was completed by review of records, images and assistance from the bedside nurse/clinical staff.   Reason for Consult:DTPI (deep tissue pressure injury) Wound type: Deep Tissue Pressure injury Pressure Injury POA: No Measurement: 5.5cm x 8.2 x 0cm  Wound bed: deep dark purple, superficial skin peeling  Drainage (amount, consistency, odor) none  Periwound: intact  Dressing procedure/placement/frequency:  Continue silicone foam per skin care order set Added chair pressure redistribution pad when sitting in the chair   [place] Nurse team will follow with you and see patient within 10 days for wound assessments.  Please notify [place] nurses of any acute changes in the wounds or any new areas of concern [place] MSN, [place], [place], [place]

## 2021-05-13 NOTE — Progress Notes (Signed)
Inpatient Rehab Admissions Coordinator:   Pt. Remains on TPN and has elevated WBCs this AM. She is not medically ready for CIR at this time. I will withdraw Pt.'s case for insurance auth for CIR but continue to follow and re-open once more medically stable.  Clemens Catholic, [place], [place] Admissions Coordinator  [phone] ([place]) [phone] (office)

## 2021-05-13 NOTE — Progress Notes (Signed)
NAME:  Dana Coleman, MRN:  [id number], DOB:  Dec 01, 1940, LOS: 9 ADMISSION DATE:  05/04/2021, CONSULTATION DATE:  7/23 REFERRING MD:  Ralene Bathe, CHIEF COMPLAINT:  Dyspnea   History of Present Illness:  80 y/o female admitted for empyema after a lap chole, noted to have esophageal perforation.  Pertinent  Medical History  DM2 CAD Hypertension Hyperlipidemia Prior R breast cancer  Significant Hospital Events: Including procedures, antibiotic start and stop dates in addition to other pertinent events   7/23 admitted intubated. Pigtail by CCS in ED -- interestingly seems to have bilious outpt 7/24 Starting CRRT. Abundant GPCs in pleural fluid 7/24 1.  Right thoracotomy 2.  Repair of esophageal perforation with intercostal pedicled muscle flap. 3.  Drainage of empyema and decortication of right lung 7/25 early AM EKG changes, trop leak, probable inferiror NSTEMI, not candidate for intervention.  Echo with mildly reduced LV, large RV ?cause 7/26 extubated 7/29 up out of bed, rising WBC 7/30 blood culture >  7/31 WBC up again, cough with mucus production, chest pain 8/1 elevated WBC, added Vanco, MRSA swab pending, CT stripped from clot by [place]   Interim History / Subjective:   Comfortable. Some pain. Low grade fevers   Objective   Blood pressure 133/62, pulse 91, temperature (!) 97.5 F (36.4 C), resp. rate (!) 29, height '5\' 1"'$  (1.549 m), weight 60.5 kg, SpO2 100 %.        Intake/Output Summary (Last 24 hours) at 05/13/2021 0924 Last data filed at 05/13/2021 0200 Gross per 24 hour  Intake 1356.74 ml  Output 1185 ml  Net 171.74 ml   Filed Weights   05/04/21 1625 05/10/21 0500  Weight: 58 kg 60.5 kg    Examination:  General: Elderly female resting comfortably in bed HENT: NCAT, tracking appropriately PULM: Diminished breath sounds in the right base, crackles, rhonchi CV: Regular rate rhythm, S1-S2 GI: Once present, soft nontender MSK: Normal bulk and tone Neuro: a alert  oriented following commands  Chest x-ray 05/13/2021: Volume loss on the right side midlung airspace disease,, small right-sided effusion. The patient's images have been independently reviewed by me.    Resolved Hospital Problem list     Assessment & Plan:  Empyema, polymicrobial due to esophageal perforation status postrepair Bleeding from chest tube, small hemothorax, clot and chest tube Plan: With increasing white count continue on meropenem plus vancomycin plus Eraxis Repeat MRSA swab Discussed with pharmacy Chest tube management per cardiothoracic surgery Case discussed at bedside with cardiothoracic surgery, Dr. Cyndia Bent Heparin stopped Remains n.p.o. on TPN  Pain post op IV Tylenol, avoiding narcotics  AKI: resolved Monitor urine output and basic metabolic panel  Rising WBC 7/31 concern for pneumonia with cough with mucus production, increased interstitial markings on R in CXR Possible small forming right-sided hemothorax, chest tube stripped on 05/13/2021 Plan: Barium esophagram on 05/13/2021  Physical deconditioning Up out of bed and working with PT as tolerated  NSTEMI with WMA and dilated RV Tele Supportive care  Atrial flutter Continue telemetry Heparin stopped due to chest tube bleeding on 05/13/2021  Hyperglycemia with DM2; one isolated elevation this morning SSI plus Lantus  Best Practice (right click and "Reselect all SmartList Selections" daily)   Diet/type: NPO and TPN DVT prophylaxis: systemic heparin GI prophylaxis: N/A Lines: Central line and yes and it is still needed Foley:  N/A Code Status:  full code Last date of multidisciplinary goals of care discussion [n/a]  Labs   CBC: Recent Labs  Lab 05/08/21  0345 05/08/21 0456 05/09/21 0405 05/10/21 0041 05/11/21 0612 05/12/21 0138 05/13/21 0550  WBC 10.6*   < > 13.6* 18.6* 26.6* 28.8* 26.9*  NEUTROABS 8.9*  --   --   --  23.5* 25.3* 25.3*  HGB 9.0*   < > 9.8* 9.2* 7.5* 8.3* 7.6*  HCT 26.9*    < > 29.8* 26.8* 21.9* 24.9* 22.5*  MCV 90.6   < > 89.2 89.9 90.9 89.9 90.4  PLT 66*   < > 70* 92* 129* 134* 177   < > = values in this interval not displayed.    Basic Metabolic Panel: Recent Labs  Lab 05/09/21 0405 05/10/21 0041 05/11/21 0612 05/11/21 0909 05/12/21 0138 05/13/21 0550  NA 141 142 139 145 148* 147*  K 3.5 3.9 5.2* 3.9 3.9 4.8  CL 108 110 110 114* 118* 119*  CO2 '26 25 23 '$ 21* 19* 19*  GLUCOSE 197* 205* 627* 204* 184* 177*  BUN 24* 25* 29* 31* 32* 36*  CREATININE 0.95 0.78 0.74 0.73 0.90 0.81  CALCIUM 8.9 9.0 8.4* 8.9 8.5* 8.5*  MG 2.0 1.6* 2.0 1.8 2.1 2.2  PHOS 2.8 3.8 6.3* 3.4  --  3.4   GFR: Estimated Creatinine Clearance: 47 mL/min (by C-G formula based on SCr of 0.81 mg/dL). Recent Labs  Lab 05/10/21 0041 05/11/21 0612 05/12/21 0138 05/13/21 0550  WBC 18.6* 26.6* 28.8* 26.9*    Liver Function Tests: Recent Labs  Lab 05/09/21 0405 05/10/21 0041 05/11/21 0612 05/12/21 0138 05/13/21 0550  AST 89* 53* 39 38 42*  ALT 138* 92* 54* 48* 44  ALKPHOS 84 74 62 54 55  BILITOT 0.6 0.4 0.3 0.3 0.6  PROT 5.3* 5.2* 5.0* 5.1* 5.4*  ALBUMIN 1.9* 1.7* 1.5* 1.5* 1.5*   No results for input(s): LIPASE, AMYLASE in the last 168 hours. No results for input(s): AMMONIA in the last 168 hours.  ABG    Component Value Date/Time   PHART 7.415 05/06/2021 0113   PCO2ART 42.1 05/06/2021 0113   PO2ART 105 05/06/2021 0113   HCO3 27.8 05/07/2021 0516   TCO2 29 05/07/2021 0516   ACIDBASEDEF 14.0 (H) 05/04/2021 2049   O2SAT 96.0 05/07/2021 0516     Coagulation Profile: No results for input(s): INR, PROTIME in the last 168 hours.   Cardiac Enzymes: No results for input(s): CKTOTAL, CKMB, CKMBINDEX, TROPONINI in the last 168 hours.  HbA1C: Hemoglobin A1C  Date/Time Value Ref Range Status  12/18/2020 10:20 AM 6.8 (A) 4.0 - 5.6 % Final  08/14/2020 01:35 PM 7.2 (A) 4.0 - 5.6 % Final   HbA1c, POC (controlled diabetic range)  Date/Time Value Ref Range Status   05/15/2020 03:27 PM 8.2 (A) 0.0 - 7.0 % Final  10/28/2019 02:30 PM 8.2 (A) 0.0 - 7.0 % Final   Hgb A1c MFr Bld  Date/Time Value Ref Range Status  04/26/2021 11:21 AM 7.4 (H) 4.8 - 5.6 % Final    Comment:    (NOTE) Pre diabetes:          5.7%-6.4%  Diabetes:              >6.4%  Glycemic control for   <7.0% adults with diabetes     CBG: Recent Labs  Lab 05/12/21 1605 05/12/21 1915 05/12/21 2337 05/13/21 0343 05/13/21 0826  GLUCAP 171* 168* 133* 138* 157*     This patient is critically ill with multiple organ system failure; which, requires frequent high complexity decision making, assessment, support, evaluation, and titration of therapies.  This was completed through the application of advanced monitoring technologies and extensive interpretation of multiple databases. During this encounter critical care time was devoted to patient care services described in this note for 31 minutes.  Garner Nash, DO [facility] 05/13/2021 9:25 AM

## 2021-05-13 NOTE — Progress Notes (Addendum)
ANTICOAGULATION CONSULT NOTE   Pharmacy Consult for heparin Indication: atrial fibrillation  No Known Allergies  Patient Measurements: Height: '5\' 1"'$  (154.9 cm) Weight: 60.5 kg (133 lb 6.1 oz) IBW/kg (Calculated) : 47.8 Heparin Dosing Weight: TBW  Vital Signs: Temp: 99.4 F (37.4 C) (08/01 0351) Temp Source: Axillary (08/01 0351) BP: 133/62 (08/01 0700) Pulse Rate: 91 (08/01 0700)  Labs: Recent Labs    05/11/21 0612 05/11/21 0909 05/11/21 1200 05/12/21 0138 05/13/21 0550  HGB 7.5*  --   --  8.3* 7.6*  HCT 21.9*  --   --  24.9* 22.5*  PLT 129*  --   --  134* 177  HEPARINUNFRC  --   --  0.46 0.53 0.28*  CREATININE 0.74 0.73  --  0.90 0.81     Estimated Creatinine Clearance: 47 mL/min (by C-G formula based on SCr of 0.81 mg/dL).   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 Post-op experienced a fib. Pharmacy dosing heparin with low heparin goal due to recent procedures and thrombocytopenia.Platelets up-trending (177) today. Hgb low at 7.6, but stable. -heparin level = 0.28 on heparin drip 1150 units/hr  Goal of Therapy:  Heparin level goal of 0.3-0.5 given low platelets  Monitor  platelets by anticoagulation protocol: Yes   Plan:  -Increase heparin to 1200 units/hr -Heparin level daily wth CBC daily  Cathrine Muster, PharmD PGY2 Cardiology Pharmacy Resident Phone: [phone] 05/13/2021  7:16 AM Please check [email or web address] for unit-specific pharmacy phone numbers.   Heparin gtt stopped on 8/1 due to increased bleeding from chest tubes

## 2021-05-13 NOTE — Progress Notes (Addendum)
PHARMACY - TOTAL PARENTERAL NUTRITION CONSULT NOTE  Indication: s/p esophageal perforation and repair  Patient Measurements: Height: '5\' 1"'$  (154.9 cm) Weight: 60.5 kg (133 lb 6.1 oz) IBW/kg (Calculated) : 47.8   Body mass index is 25.2 kg/m.  Assessment:  80 y/o female with recent lap chole for symptomatic cholelithiasis on 7/21. PMH including HTN, HLD, DM, R breast cancer, and CAD. Patient presented on 7/23 with AMS and unresponsiveness - was complaining of abdominal discomfort and then patient became unresponsive and slumped on table.  S/p gall bladder surgery and esophageal perforation-repaired 7/24.    Pharmacy consulted for TPN management.  Glucose / Insulin: DM with A1c 7.4% on Lantus 24/d PTA. CBGs < 180. Utilized 22 units SSI in 24 hrs + 65 units insulin in TPN (increased from 55 units on 7/31) Electrolytes: high Na/CL 148/118, low CO2, CoCa @ 10.5 (no Ca in TPN), Mag 2.2 , K trending up 3.9->4.7 , others WNL 08/01: (Of note, unable to add acetate and had to decrease phosphate in order to decrease potassium. Unable to add aceteate due to elevated sodium levels.) Renal: septic ATN s/p CRRT (end 7/25) - SCr < 1, BUN 36 Hepatic: LFTs normalizing, tbili / WNL, albumin 1.5, prealbumin 6.1, TG at 113 Intake / Output; MIVF:  UOP 1145m GI Imaging: - 7/23 CT: no acute abnormalities  GI Surgeries / Procedures:  - 7/21: s/p laparoscopic cholecystectomy and LOA - 7/24: s/p R thoracotomy, repair of esophageal perf with muscle flap, drainage of empyema and decortication of R lung  Central access: PICC 7/26 (double lumen) TPN start date: 05/07/21  Nutritional Goals (per RD rec on 7/26): kCal: 1750-2030 , Protein: 87-116 , Fluid: >=1.8L  Current Nutrition:  TPN  Plan:  Continue TPN at goal rate of 80 ml/hr to provide 111g AA, 230g CHO and 58g ILE for a total of 1805 kCal, meeting 100% of patient's needs Electrolytes in TPN: continue Na 073m/L, decrease K to 201mL, Ca 0mE68m since  7/30, Mg 5mEq70m decrease Phos to 13mmo28m continue at max acetate Add standard MVI and trace elements to TPN Change CVTS to SSI Q6H and increase regular insulin in TPN to 70 units Monitor TPN labs on Mon/Thurs and daily BMP/Mg/Phos  Plan for swallow eval on 8/1   Thank you for allowing pharmacy to be a part of this patient's care.  AdrienArdyth HarpsmD Clinical Pharmacist

## 2021-05-13 NOTE — Progress Notes (Signed)
8 Days Post-Op Procedure(s) (LRB): THORACOTOMY MAJOR REPAIR PERFORATED ESOPHAGUS (Right) Subjective: Feels weak. Pain under reasonable control.   Objective: Vital signs in last 24 hours: Temp:  [97.8 F (36.6 C)-99.4 F (37.4 C)] 99.4 F (37.4 C) (08/01 0351) Pulse Rate:  [76-95] 91 (08/01 0700) Cardiac Rhythm: Normal sinus rhythm (08/01 0200) Resp:  [22-35] 29 (08/01 0700) BP: (113-146)/(47-79) 133/62 (08/01 0700) SpO2:  [92 %-100 %] 100 % (08/01 0700)  Hemodynamic parameters for last 24 hours:    Intake/Output from previous day: 07/31 0701 - 08/01 0700 In: 1356.7 [I.V.:926.7; IV Piggyback:430] Out: [id number] [Urine:1150; Chest Tube:10] Intake/Output this shift: No intake/output data recorded.  General appearance: alert, cooperative, and fatigued Neurologic: intact Heart: regular rhythm Lungs: diminished breath sounds bibasilar Abdomen: soft, non-tender; bowel sounds normal; no masses,  no organomegaly Extremities: edema mild Wound: dressing dry Chest tube output is bloody and tubes occluded with clots.   Lab Results: Recent Labs    05/12/21 0138 05/13/21 0550  WBC 28.8* 26.9*  HGB 8.3* 7.6*  HCT 24.9* 22.5*  PLT 134* 177   BMET:  Recent Labs    05/12/21 0138 05/13/21 0550  NA 148* 147*  K 3.9 4.8  CL 118* 119*  CO2 19* 19*  GLUCOSE 184* 177*  BUN 32* 36*  CREATININE 0.90 0.81  CALCIUM 8.5* 8.5*    PT/INR: No results for input(s): LABPROT, INR in the last 72 hours. ABG    Component Value Date/Time   PHART 7.415 05/06/2021 0113   HCO3 27.8 05/07/2021 0516   TCO2 29 05/07/2021 0516   ACIDBASEDEF 14.0 (H) 05/04/2021 2049   O2SAT 96.0 05/07/2021 0516   CBG (last 3)  Recent Labs    05/12/21 1915 05/12/21 2337 05/13/21 0343  GLUCAP 168* 133* 138*   CXR: Bibasilar atelectasis and decreased aeration of right lung compared to last week.  Assessment/Plan: S/P Procedure(s) (LRB): THORACOTOMY MAJOR REPAIR PERFORATED ESOPHAGUS (Right)  She has been  hemodynamically stable   Rhythm has been sinus with periods of atrial fib. She has been on heparin but this am chest tube output is bloody and the chest drains are occluded with clot. Will try to declot. Stop heparin.   Antibiotics changed over the weekend for rising WBC ct, possible pneumonia. WBC stable from yesterday. BC negative so far. She needs to work on IS, OOB, ambulation.  Will get esophagram today to assess repair for leak.   Acute blood loss anemia: Drop in Hgb from yesterday likely due to bleeding in chest with bloody chest tube drainage. Will stop heparin. Transfuse 1 unit of PRBC's this am.      LOS: 9 days    Gaye Pollack 05/13/2021

## 2021-05-14 ENCOUNTER — Inpatient Hospital Stay (HOSPITAL_COMMUNITY): Payer: Medicare Other

## 2021-05-14 DIAGNOSIS — K223 Perforation of esophagus: Secondary | ICD-10-CM | POA: Diagnosis not present

## 2021-05-14 LAB — BASIC METABOLIC PANEL
Anion gap: 7 (ref 5–15)
BUN: 34 mg/dL — ABNORMAL HIGH (ref 8–23)
CO2: 19 mmol/L — ABNORMAL LOW (ref 22–32)
Calcium: 8.4 mg/dL — ABNORMAL LOW (ref 8.9–10.3)
Chloride: 115 mmol/L — ABNORMAL HIGH (ref 98–111)
Creatinine, Ser: 0.77 mg/dL (ref 0.44–1.00)
GFR, Estimated: >60 mL/min (ref >60)
Glucose, Bld: 157 mg/dL — ABNORMAL HIGH (ref 70–99)
Potassium: 3.5 mmol/L (ref 3.5–5.1)
Sodium: 141 mmol/L (ref 135–145)

## 2021-05-14 LAB — CBC
HCT: 23.9 % — ABNORMAL LOW (ref 36.0–46.0)
Hemoglobin: 7.9 g/dL — ABNORMAL LOW (ref 12.0–15.0)
MCH: 30.5 pg (ref 26.0–34.0)
MCHC: 33.1 g/dL (ref 30.0–36.0)
MCV: 92.3 fL (ref 80.0–100.0)
Platelets: 217 10*3/uL (ref 150–400)
RBC: 2.59 MIL/uL — ABNORMAL LOW (ref 3.87–5.11)
RDW: 16.6 % — ABNORMAL HIGH (ref 11.5–15.5)
WBC: 26.3 10*3/uL — ABNORMAL HIGH (ref 4.0–10.5)
nRBC: 0.3 % — ABNORMAL HIGH (ref 0.0–0.2)

## 2021-05-14 LAB — GLUCOSE, CAPILLARY
Glucose-Capillary: 150 mg/dL — ABNORMAL HIGH (ref 70–99)
Glucose-Capillary: 153 mg/dL — ABNORMAL HIGH (ref 70–99)
Glucose-Capillary: 128 mg/dL — ABNORMAL HIGH (ref 70–99)
Glucose-Capillary: 180 mg/dL — ABNORMAL HIGH (ref 70–99)

## 2021-05-14 LAB — PHOSPHORUS: Phosphorus: 2.7 mg/dL (ref 2.5–4.6)

## 2021-05-14 LAB — PREPARE RBC (CROSSMATCH)

## 2021-05-14 MED ORDER — ENOXAPARIN SODIUM 40 MG/0.4ML IJ SOSY
40.0000 mg | PREFILLED_SYRINGE | Freq: Every day | INTRAMUSCULAR | Status: DC
  Administered 2021-05-14 – 2021-05-19 (×6): 40 mg via SUBCUTANEOUS
  Filled 2021-05-14 (×6): qty 0.4

## 2021-05-14 MED ORDER — SODIUM CHLORIDE 0.9% IV SOLUTION: Freq: Once | INTRAVENOUS | Status: AC

## 2021-05-14 MED ORDER — OXYCODONE HCL 5 MG PO TABS
5.0000 mg | ORAL_TABLET | Freq: Four times a day (QID) | ORAL | Status: DC | PRN
  Administered 2021-05-14: 5 mg via ORAL
  Administered 2021-05-14: 10 mg via ORAL
  Administered 2021-05-14: 5 mg via ORAL
  Administered 2021-05-15 – 2021-05-16 (×4): 10 mg via ORAL
  Administered 2021-05-16: 5 mg via ORAL
  Administered 2021-05-17 – 2021-05-19 (×10): 10 mg via ORAL
  Administered 2021-05-20: 5 mg via ORAL
  Administered 2021-05-20: 10 mg via ORAL
  Administered 2021-05-21: 5 mg via ORAL
  Filled 2021-05-14 (×4): qty 2
  Filled 2021-05-14: qty 1
  Filled 2021-05-14 (×2): qty 2
  Filled 2021-05-14: qty 1
  Filled 2021-05-14 (×2): qty 2
  Filled 2021-05-14: qty 1
  Filled 2021-05-14 (×6): qty 2
  Filled 2021-05-14: qty 1
  Filled 2021-05-14 (×4): qty 2

## 2021-05-14 MED ORDER — POTASSIUM CHLORIDE 10 MEQ/50ML IV SOLN
10.0000 meq | INTRAVENOUS | Status: AC
  Administered 2021-05-14 (×3): 10 meq via INTRAVENOUS
  Filled 2021-05-14: qty 50

## 2021-05-14 MED ORDER — TRAVASOL 10 % IV SOLN
INTRAVENOUS | Status: AC
  Filled 2021-05-14: qty 1113.6

## 2021-05-14 NOTE — Progress Notes (Signed)
9 Days Post-Op Procedure(s) (LRB): THORACOTOMY MAJOR REPAIR PERFORATED ESOPHAGUS (Right) Subjective: Complaints of pain. Drank water yesterday and overnight without difficulty.  Objective: Vital signs in last 24 hours: Temp:  [97.5 F (36.4 C)-99.7 F (37.6 C)] 97.7 F (36.5 C) (08/02 0700) Pulse Rate:  [64-92] 82 (08/02 0600) Cardiac Rhythm: Normal sinus rhythm (08/02 0600) Resp:  [9-33] 12 (08/02 0000) BP: (112-154)/(60-79) 147/76 (08/02 0700) SpO2:  [91 %-100 %] 95 % (08/02 0600) Weight:  [65.6 kg] 65.6 kg (08/02 0444)  Hemodynamic parameters for last 24 hours:    Intake/Output from previous day: 08/01 0701 - 08/02 0700 In: 3447 [P.O.:240; I.V.:2431.4; Blood:345; IV Piggyback:430.6] Out: [id number] [Urine:2450; Chest Tube:90] Intake/Output this shift: No intake/output data recorded.  General appearance: alert and cooperative Neurologic: intact Heart: regular rate and rhythm Lungs: diminished breath sounds bibasilar Abdomen: soft, non-tender; bowel sounds normal; no masses,  no organomegaly Extremities: edema mild Wound: dressing dry Chest tube output thin serosanguinous.  Lab Results: Recent Labs    05/12/21 0138 05/13/21 0550  WBC 28.8* 26.9*  HGB 8.3* 7.6*  HCT 24.9* 22.5*  PLT 134* 177   BMET:  Recent Labs    05/12/21 0138 05/13/21 0550  NA 148* 147*  K 3.9 4.8  CL 118* 119*  CO2 19* 19*  GLUCOSE 184* 177*  BUN 32* 36*  CREATININE 0.90 0.81  CALCIUM 8.5* 8.5*    PT/INR: No results for input(s): LABPROT, INR in the last 72 hours. ABG    Component Value Date/Time   PHART 7.415 05/06/2021 0113   HCO3 27.8 05/07/2021 0516   TCO2 29 05/07/2021 0516   ACIDBASEDEF 14.0 (H) 05/04/2021 2049   O2SAT 96.0 05/07/2021 0516   CBG (last 3)  Recent Labs    05/13/21 2332 05/14/21 0649 05/14/21 0703  GLUCAP 155* 150* 153*    Assessment/Plan: S/P Procedure(s) (LRB): THORACOTOMY MAJOR REPAIR PERFORATED ESOPHAGUS (Right)  POD 9  Hemodynamically stable  in sinus rhythm.   Tolerated water well so will advance to clear liquid diet today.  Keep chest tubes in today.   Antibiotics per CCM.  CBC not sent this am. I ordered one to follow up on Hgb and WBC ct.  BMET pending.  CXR pending.  IS, OOB, mobilize as tolerated. She has a sacral pressure sore according to nurses.   LOS: 10 days    Gaye Pollack 05/14/2021

## 2021-05-14 NOTE — Progress Notes (Signed)
Inpatient Rehab Admissions Coordinator:   Pt. Is not medically ready for CIR at this time. Note that she began clear liquids today. She will need to advance to at least soft solids or have Coretrak or PEG before coming to CIR. I will continue to follow for potential admit pending medical readiness, insurance auth, and bed availability.  Clemens Catholic, [place], [place] Admissions Coordinator  [phone] ([place]) [phone] (office)

## 2021-05-14 NOTE — Progress Notes (Signed)
Nutrition Follow-up  DOCUMENTATION CODES:   Severe malnutrition in context of acute illness/injury  INTERVENTION:   Continue TPN to meet 100% estimated nutritional needs -Recommend continuing TPN until diet advanced to Soft, pt demonstrating tolerance and eating at least 50% of meals  Once diet advanced past CL, recommend Ensure Enlive po BID, each supplement provides 350 kcal and 20 grams of protein   NUTRITION DIAGNOSIS:   Severe Malnutrition related to acute illness as evidenced by moderate fat depletion, mild fat depletion, moderate muscle depletion.  Being addressed via TPN, diet starting to be advanced  GOAL:   Patient will meet greater than or equal to 90% of their needs  Met  MONITOR:   Diet advancement, I & O's, Labs, Weight trends, Skin (TPN)  REASON FOR ASSESSMENT:   Rounds    ASSESSMENT:   80 yo female admitted post recent lap cholecystectomy with acute respiratory failure and renal failure with severe lactic acidosis from sepsis and found to have esophageal perforation, empyema. PMH includes DM, HTN, HLD, CAD    7/21 Lap chole 7/23 Admitted, Intubated, Pigtail by CCS in ED with bilious output 7/24 CRRT initiated, R Thoracotomy, repair of esophageal perforation, drainage of empyema and decortication of right lung 7/26 Extubated, TPN initiated 8/01 Barium swallow, no leak 8/02 CL diet started  Noted plan for CIR. Pt appears extremely weak  Tolerated sips of water over night, started on CL diet today. Per RN, pt took some sips of gingerale so far. Pt reports she likes Ensure; plan to start once diet advanced  TPN at 80 ml/hr providing 1805 kcals, 111 g of protein  Weight up to 65.6 kg .   Chest tubes in place with <100 mL output  Noted DTI to buttocks  Labs: reviewed Meds: ss novolog, Kcl  NUTRITION - FOCUSED PHYSICAL EXAM:  Flowsheet Row Most Recent Value  Orbital Region Moderate depletion  Upper Arm Region Mild depletion  Thoracic and  Lumbar Region Mild depletion  Buccal Region Moderate depletion  Temple Region Moderate depletion  Clavicle Bone Region Moderate depletion  Clavicle and Acromion Bone Region Moderate depletion  Scapular Bone Region Moderate depletion  Dorsal Hand Mild depletion  Patellar Region Severe depletion  Anterior Thigh Region Severe depletion  Posterior Calf Region Severe depletion       Diet Order:   Diet Order             Diet clear liquid Room service appropriate? Yes; Fluid consistency: Thin  Diet effective now                   EDUCATION NEEDS:   Not appropriate for education at this time  Skin:  Skin Assessment: Skin Integrity Issues: Skin Integrity Issues:: DTI DTI: buttocks Stage I: sacrum Incisions: abdomen/chest  Last BM:  8/2  Height:   Ht Readings from Last 1 Encounters:  05/04/21 5' 1"  (1.549 m)    Weight:   Wt Readings from Last 1 Encounters:  05/14/21 65.6 kg     BMI:  Body mass index is 27.33 kg/m.  Estimated Nutritional Needs:   Kcal:  1750-2030 kcals  Protein:  87-116 g  Fluid:  >/= 1.8 L   Kerman Passey MS, RDN, LDN, CNSC Registered Dietitian III Clinical Nutrition RD Pager and On-Call Pager Number Located in [place]

## 2021-05-14 NOTE — Progress Notes (Signed)
NAME:  Dana Coleman, MRN:  [id number], DOB:  [birth year], LOS: 7 ADMISSION DATE:  05/04/2021, CONSULTATION DATE:  7/23 REFERRING MD:  Ralene Bathe, CHIEF COMPLAINT:  Dyspnea   History of Present Illness:  80 y/o female admitted for empyema after a lap chole, noted to have esophageal perforation.  Pertinent  Medical History  DM2 CAD Hypertension Hyperlipidemia Prior R breast cancer  Significant Hospital Events: Including procedures, antibiotic start and stop dates in addition to other pertinent events   7/23 admitted intubated. Pigtail by CCS in ED -- interestingly seems to have bilious outpt 7/24 Starting CRRT. Abundant GPCs in pleural fluid 7/24 1.  Right thoracotomy 2.  Repair of esophageal perforation with intercostal pedicled muscle flap. 3.  Drainage of empyema and decortication of right lung 7/25 early AM EKG changes, trop leak, probable inferiror NSTEMI, not candidate for intervention.  Echo with mildly reduced LV, large RV ?cause 7/26 extubated 7/29 up out of bed, rising WBC 7/30 blood culture >  7/31 WBC up again, cough with mucus production, chest pain 8/1 elevated WBC, added Vanco, MRSA swab pending, CT stripped from clot by [facility]  8/2 pain control, OOB, passed swallow, started clears   Interim History / Subjective:   Pain control is better. She passed swallow study. On clears today   Objective   Blood pressure (!) 147/76, pulse 82, temperature 97.7 F (36.5 C), temperature source Oral, resp. rate 12, height '5\' 1"'$  (1.549 m), weight 65.6 kg, SpO2 95 %.        Intake/Output Summary (Last 24 hours) at 05/14/2021 0842 Last data filed at 05/14/2021 0600 Gross per 24 hour  Intake 2905.57 ml  Output 1640 ml  Net 1265.57 ml   Filed Weights   05/04/21 1625 05/10/21 0500 05/14/21 0444  Weight: 58 kg 60.5 kg 65.6 kg    Examination:  General: elderly fm, resting in chair HENT: NCAT tracking  PULM: Diminished in the rigth base compared to the left  CV: RRR s1 s2  GI:  soft, nontender  MSK: normal bulk and tone  Neuro: AAOX3  Chest x-ray 05/14/2021: Right side appears maybe a little better aerated The patient's images have been independently reviewed by me.    Resolved Hospital Problem list     Assessment & Plan:   Empyema, polymicrobial (strep and candida albicans) due to esophageal perforation status postrepair Bleeding from chest tube, small hemothorax, clot and chest tube (improved)  Plan: Continue Abx regimen  Follow WBC and temp curve  Observe for any other signs of bleeding  Off heparin  Advancing diet to clears  ?maybe able to move from ICU tomorrow. However I will defer to Dr. Cyndia Bent   Pain post op IV tylenol  PRN oxycodone  IV PRN Fent for break through   AKI: resolved Follow UOP and BMET   Rising WBC 7/31 concern for pneumonia with cough with mucus production, increased interstitial markings on R in CXR Possible small forming right-sided hemothorax, chest tube stripped on 05/13/2021 Plan: Passed esophagram  Started clear diet   Physical deconditioning OOB Mobility  PT OT   NSTEMI with WMA and dilated RV Tele, supportive care   Atrial flutter Continue telemetry Heparin stopped on 8/1 due to chest tube bleeding   Hyperglycemia with DM2; one isolated elevation this morning SSI plus lantus   Best Practice (right click and "Reselect all SmartList Selections" daily)   Diet/type: clear liquids and TPN DVT prophylaxis: systemic heparin GI prophylaxis: N/A Lines: Central line and yes  and it is still needed Foley:  N/A Code Status:  full code Last date of multidisciplinary goals of care discussion [n/a]  Labs   CBC: Recent Labs  Lab 05/08/21 0345 05/08/21 0456 05/10/21 0041 05/11/21 0612 05/12/21 0138 05/13/21 0550 05/14/21 0738  WBC 10.6*   < > 18.6* 26.6* 28.8* 26.9* 26.3*  NEUTROABS 8.9*  --   --  23.5* 25.3* 25.3*  --   HGB 9.0*   < > 9.2* 7.5* 8.3* 7.6* 7.9*  HCT 26.9*   < > 26.8* 21.9* 24.9* 22.5*  23.9*  MCV 90.6   < > 89.9 90.9 89.9 90.4 92.3  PLT 66*   < > 92* 129* 134* 177 217   < > = values in this interval not displayed.    Basic Metabolic Panel: Recent Labs  Lab 05/10/21 0041 05/11/21 0612 05/11/21 0909 05/12/21 0138 05/13/21 0550 05/14/21 0540  NA 142 139 145 148* 147* 141  K 3.9 5.2* 3.9 3.9 4.8 3.5  CL 110 110 114* 118* 119* 115*  CO2 25 23 21* 19* 19* 19*  GLUCOSE 205* 627* 204* 184* 177* 157*  BUN 25* 29* 31* 32* 36* 34*  CREATININE 0.78 0.74 0.73 0.90 0.81 0.77  CALCIUM 9.0 8.4* 8.9 8.5* 8.5* 8.4*  MG 1.6* 2.0 1.8 2.1 2.2  --   PHOS 3.8 6.3* 3.4  --  3.4 2.7   GFR: Estimated Creatinine Clearance: 49.4 mL/min (by C-G formula based on SCr of 0.77 mg/dL). Recent Labs  Lab 05/11/21 0612 05/12/21 0138 05/13/21 0550 05/14/21 0738  WBC 26.6* 28.8* 26.9* 26.3*    Liver Function Tests: Recent Labs  Lab 05/09/21 0405 05/10/21 0041 05/11/21 0612 05/12/21 0138 05/13/21 0550  AST 89* 53* 39 38 42*  ALT 138* 92* 54* 48* 44  ALKPHOS 84 74 62 54 55  BILITOT 0.6 0.4 0.3 0.3 0.6  PROT 5.3* 5.2* 5.0* 5.1* 5.4*  ALBUMIN 1.9* 1.7* 1.5* 1.5* 1.5*   No results for input(s): LIPASE, AMYLASE in the last 168 hours. No results for input(s): AMMONIA in the last 168 hours.  ABG    Component Value Date/Time   PHART 7.415 05/06/2021 0113   PCO2ART 42.1 05/06/2021 0113   PO2ART 105 05/06/2021 0113   HCO3 27.8 05/07/2021 0516   TCO2 29 05/07/2021 0516   ACIDBASEDEF 14.0 (H) 05/04/2021 2049   O2SAT 96.0 05/07/2021 0516     Coagulation Profile: No results for input(s): INR, PROTIME in the last 168 hours.   Cardiac Enzymes: No results for input(s): CKTOTAL, CKMB, CKMBINDEX, TROPONINI in the last 168 hours.  HbA1C: Hemoglobin A1C  Date/Time Value Ref Range Status  12/18/2020 10:20 AM 6.8 (A) 4.0 - 5.6 % Final  08/14/2020 01:35 PM 7.2 (A) 4.0 - 5.6 % Final   HbA1c, POC (controlled diabetic range)  Date/Time Value Ref Range Status  05/15/2020 03:27 PM 8.2  (A) 0.0 - 7.0 % Final  10/28/2019 02:30 PM 8.2 (A) 0.0 - 7.0 % Final   Hgb A1c MFr Bld  Date/Time Value Ref Range Status  04/26/2021 11:21 AM 7.4 (H) 4.8 - 5.6 % Final    Comment:    (NOTE) Pre diabetes:          5.7%-6.4%  Diabetes:              >6.4%  Glycemic control for   <7.0% adults with diabetes     CBG: Recent Labs  Lab 05/13/21 1153 05/13/21 1630 05/13/21 2332 05/14/21 [id number] 05/14/21 0703  GLUCAP 162* [place] Kesinger, DO [facility] 05/14/2021 8:42 AM

## 2021-05-14 NOTE — Progress Notes (Signed)
Occupational Therapy Treatment Patient Details Name: Dana Coleman MRN: [id number] DOB: [birth year] Today's Date: 05/14/2021    History of present illness Pt is a 80 yo female admitted on 05/04/21 for AMS. HX of cholecystectomy on 7/21.  Intubated upon admission, 7/24 Started CRRT, Esophageal perforation with septic shock post flap repair, drainage of empyema and decortication of R lung with chest tube placed.  7/25 EKG changes and probable nstemi. Extubated 7/26. PMH includes: R breast CA, DM, CAD, HTN.   OT comments  Patient seated in recliner upon entry, lethargic.  Requires mod assist +2 to stand from recliner, total assist for hygiene due to bowel incontinence, and min assist +2 to stand pivot using RW to bed.  Max verbal cueing required with decreased initiation during functional tasks, increased fatigue this session.  Resting in bed upon exit.  Remains limited by pain, weakness, decreased activity tolerance. Will follow, continue to recommend CIR.    Follow Up Recommendations  CIR;Supervision/Assistance - 24 hour    Equipment Recommendations  3 in 1 bedside commode    Recommendations for Other Services Rehab consult    Precautions / Restrictions Precautions Precautions: Fall Precaution Comments: R chest tube Restrictions Weight Bearing Restrictions: No       Mobility Bed Mobility Overal bed mobility: Needs Assistance Bed Mobility: Sit to Supine;Rolling Rolling: Mod assist     Sit to supine: Max assist;+2 for safety/equipment;+2 for physical assistance   General bed mobility comments: pt returned to bed with max assist +2, given multimodal cueing and limited initation of tasks due to lethargy; roling for hygiene and pad change with mod assist    Transfers Overall transfer level: Needs assistance Equipment used: Rolling walker (2 wheeled) Transfers: Sit to/from [facility] Sit to Stand: Mod assist;+2 physical assistance;+2 safety/equipment Stand  pivot transfers: Min assist;+2 physical assistance;+2 safety/equipment       General transfer comment: mod assist +2 to power up and steady from recliner using RW, cueing for hand placement and safety; pivot to recliner with min assist given cueing for safety to prevent sitting prematurely due to fatigue    Balance Overall balance assessment: Needs assistance Sitting-balance support: No upper extremity supported;Feet supported Sitting balance-Leahy Scale: Fair     Standing balance support: Bilateral upper extremity supported;During functional activity Standing balance-Leahy Scale: Poor Standing balance comment: relies on BUE and external support                           ADL either performed or assessed with clinical judgement   ADL Overall ADL's : Needs assistance/impaired                     Lower Body Dressing: Total assistance;+2 for physical assistance;Sit to/from stand   Toilet Transfer: Moderate assistance;+2 for physical assistance;+2 for safety/equipment;Stand-pivot;RW Toilet Transfer Details (indicate cue type and reason): simulated from recliner to [place] and Hygiene: Total assistance;+2 for physical assistance;+2 for safety/equipment;Sit to/from stand Toileting - Clothing Manipulation Details (indicate cue type and reason): incontient of bowel in recliner, +2 for hygiene in standing     Functional mobility during ADLs: Moderate assistance;+2 for physical assistance;+2 for safety/equipment;Rolling walker;Cueing for safety;Cueing for sequencing General ADL Comments: pt limited by weakness, decreased activity tolerance, impaired balance     Vision       Perception     Praxis      Cognition Arousal/Alertness: Lethargic Behavior During Therapy: WFL for tasks  assessed/performed Overall Cognitive Status: Impaired/Different from baseline Area of Impairment: Problem solving;Following commands                        Following Commands: Follows one step commands consistently;Follows one step commands with increased time;Follows multi-step commands inconsistently     Problem Solving: Slow processing;Decreased initiation;Difficulty sequencing;Requires verbal cues General Comments: pt following 1 step commands but requires increased time to process and problem solve, initally reporting July but able to correct to august with cueing- anticpate disorientation to time is from lethargy        Exercises     Shoulder Instructions       General Comments VSS on 2L [place]    Pertinent Vitals/ Pain       Pain Assessment: Faces Faces Pain Scale: Hurts even more Pain Location: R flank Pain Descriptors / Indicators: Discomfort;Grimacing;Moaning Pain Intervention(s): Limited activity within patient's tolerance;Monitored during session;Repositioned  Home Living                                          Prior Functioning/Environment              Frequency  Min 2X/week        Progress Toward Goals  OT Goals(current goals can now be found in the care plan section)  Progress towards OT goals: Progressing toward goals  Acute Rehab OT Goals Patient Stated Goal: get back to housework OT Goal Formulation: With patient  Plan Discharge plan remains appropriate;Frequency remains appropriate    Co-evaluation                 AM-PAC OT "6 Clicks" Daily Activity     Outcome Measure   Help from another person eating meals?: A Lot Help from another person taking care of personal grooming?: A Lot Help from another person toileting, which includes using toliet, bedpan, or urinal?: Total Help from another person bathing (including washing, rinsing, drying)?: A Lot Help from another person to put on and taking off regular upper body clothing?: A Lot Help from another person to put on and taking off regular lower body clothing?: Total 6 Click Score: 10    End of Session  Equipment Utilized During Treatment: Oxygen;Rolling walker  OT Visit Diagnosis: Other abnormalities of gait and mobility (R26.89);Muscle weakness (generalized) (M62.81);Pain;Other symptoms and signs involving cognitive function Pain - Right/Left: Right Pain - part of body:  (flank)   Activity Tolerance Patient limited by fatigue;Patient limited by lethargy   Patient Left in bed;with call bell/phone within reach;with bed alarm set;with family/visitor present   Nurse Communication Mobility status        Time: [id number] OT Time Calculation (min): 20 min  Charges: OT General Charges $OT Visit: 1 Visit OT Treatments $Self Care/Home Management : 8-22 mins  [name], OT Acute Rehabilitation Services Pager [phone] Office [phone]    [name] 05/14/2021, 11:49 AM

## 2021-05-14 NOTE — Progress Notes (Signed)
PHARMACY - TOTAL PARENTERAL NUTRITION CONSULT NOTE  Indication: s/p esophageal perforation and repair  Patient Measurements: Height: '5\' 1"'$  (154.9 cm) Weight: 65.6 kg (144 lb 10 oz) IBW/kg (Calculated) : 47.8   Body mass index is 27.33 kg/m.  Assessment:  80 y/o female with recent lap chole for symptomatic cholelithiasis on 7/21. PMH including HTN, HLD, DM, R breast cancer, and CAD. Patient presented on 7/23 with AMS and unresponsiveness - was complaining of abdominal discomfort and then patient became unresponsive and slumped on table.  S/p gall bladder surgery and esophageal perforation-repaired 7/24.  Pharmacy consulted for TPN management.  Glucose / Insulin: DM with A1c 7.4% on Lantus 24/d PTA. CBGs < 180. Utilized 14 units SSI in 24 hrs + 70 units insulin in TPN  Electrolytes: Na normalized, CL 115 and low CO2 (max acetate in TPN), CoCa high normal at 10.4 (no Ca in TPN), others WNL Renal: septic ATN s/p CRRT (end 7/25) - SCr < 1, BUN 36 Hepatic: LFTs normalizing, tbili / WNL, albumin 1.5, prealbumin 6.1, TG down to 113 Intake / Output; MIVF:  UOP 244m, chest tube 956m net +3.2L GI Imaging: - 7/23 CT: no acute abnormalities  GI Surgeries / Procedures:  - 7/21: s/p laparoscopic cholecystectomy and LOA - 7/24: s/p R thoracotomy, repair of esophageal perf with muscle flap, drainage of empyema and decortication of R lung  Central access: PICC 7/26 (double lumen) TPN start date: 05/07/21  Nutritional Goals (per RD rec on 7/26): kCal: 1750-2030 , Protein: 87-116 , Fluid: >=1.8L  Current Nutrition:  TPN Clear liquid diet started 8/2  Plan:  Continue TPN at goal rate of 80 ml/hr to provide 111g AA, 230g CHO and 58g ILE for a total of 1805 kCal, meeting 100% of patient's needs Electrolytes in TPN: Na 70m29mL, increase K to 94m94m, Ca 70mEq44msince 7/30, Mg 5mEq/5mincrease Phos to 16mmol58mmax acetate Add standard MVI and trace elements to TPN Continue CVTS SSI Q6H and 70 units  regular insulin in TPN Monitor TPN labs on Mon/Thurs F/U PO intake/diet advancement to wean TPN  Lexxie Winberg D. Linh Johannes, PMina MarbleD, BCPS, BCCCP [id number], 8:41 AM

## 2021-05-14 NOTE — Plan of Care (Signed)

## 2021-05-15 ENCOUNTER — Inpatient Hospital Stay (HOSPITAL_COMMUNITY): Payer: Medicare Other

## 2021-05-15 DIAGNOSIS — K223 Perforation of esophagus: Secondary | ICD-10-CM | POA: Diagnosis not present

## 2021-05-15 DIAGNOSIS — E43 Unspecified severe protein-calorie malnutrition: Secondary | ICD-10-CM

## 2021-05-15 HISTORY — DX: Unspecified severe protein-calorie malnutrition: E43

## 2021-05-15 LAB — TYPE AND SCREEN
ABO/RH(D): A POS
Antibody Screen: NEGATIVE
Unit division: 0
Unit division: 0

## 2021-05-15 LAB — CBC
HCT: 26.7 % — ABNORMAL LOW (ref 36.0–46.0)
Hemoglobin: 9 g/dL — ABNORMAL LOW (ref 12.0–15.0)
MCH: 30.3 pg (ref 26.0–34.0)
MCHC: 33.7 g/dL (ref 30.0–36.0)
MCV: 89.9 fL (ref 80.0–100.0)
Platelets: 241 10*3/uL (ref 150–400)
RBC: 2.97 MIL/uL — ABNORMAL LOW (ref 3.87–5.11)
RDW: 17 % — ABNORMAL HIGH (ref 11.5–15.5)
WBC: 22.4 10*3/uL — ABNORMAL HIGH (ref 4.0–10.5)
nRBC: 0.4 % — ABNORMAL HIGH (ref 0.0–0.2)

## 2021-05-15 LAB — GLUCOSE, CAPILLARY
Glucose-Capillary: 131 mg/dL — ABNORMAL HIGH (ref 70–99)
Glucose-Capillary: 137 mg/dL — ABNORMAL HIGH (ref 70–99)
Glucose-Capillary: 149 mg/dL — ABNORMAL HIGH (ref 70–99)
Glucose-Capillary: 149 mg/dL — ABNORMAL HIGH (ref 70–99)
Glucose-Capillary: 175 mg/dL — ABNORMAL HIGH (ref 70–99)

## 2021-05-15 LAB — BASIC METABOLIC PANEL
Anion gap: 6 (ref 5–15)
BUN: 32 mg/dL — ABNORMAL HIGH (ref 8–23)
CO2: 18 mmol/L — ABNORMAL LOW (ref 22–32)
Calcium: 8.3 mg/dL — ABNORMAL LOW (ref 8.9–10.3)
Chloride: 113 mmol/L — ABNORMAL HIGH (ref 98–111)
Creatinine, Ser: 0.72 mg/dL (ref 0.44–1.00)
GFR, Estimated: >60 mL/min (ref >60)
Glucose, Bld: 139 mg/dL — ABNORMAL HIGH (ref 70–99)
Potassium: 3.8 mmol/L (ref 3.5–5.1)
Sodium: 137 mmol/L (ref 135–145)

## 2021-05-15 LAB — BPAM RBC
Blood Product Expiration Date: 202208302359
Blood Product Expiration Date: 202208312359
ISSUE DATE / TIME: 202208011230
ISSUE DATE / TIME: 202208021427
Unit Type and Rh: 6200
Unit Type and Rh: 6200

## 2021-05-15 LAB — PHOSPHORUS: Phosphorus: 2.5 mg/dL (ref 2.5–4.6)

## 2021-05-15 MED ORDER — TRAVASOL 10 % IV SOLN: INTRAVENOUS | Status: DC

## 2021-05-15 MED ORDER — K PHOS MONO-SOD PHOS DI & MONO 155-852-130 MG PO TABS
250.0000 mg | ORAL_TABLET | Freq: Three times a day (TID) | ORAL | Status: AC
  Administered 2021-05-15 (×3): 250 mg via ORAL
  Filled 2021-05-15 (×3): qty 1

## 2021-05-15 MED ORDER — METRONIDAZOLE 500 MG/100ML IV SOLN
500.0000 mg | Freq: Two times a day (BID) | INTRAVENOUS | Status: AC
  Administered 2021-05-15 – 2021-05-23 (×17): 500 mg via INTRAVENOUS
  Filled 2021-05-15 (×17): qty 100

## 2021-05-15 MED ORDER — ENSURE ENLIVE PO LIQD
237.0000 mL | Freq: Two times a day (BID) | ORAL | Status: DC
  Administered 2021-05-15 – 2021-05-27 (×14): 237 mL via ORAL

## 2021-05-15 MED ORDER — GERHARDT'S BUTT CREAM
1.0000 "application " | TOPICAL_CREAM | Freq: Four times a day (QID) | CUTANEOUS | Status: DC
  Administered 2021-05-17 – 2021-06-17 (×102): 1 via TOPICAL
  Filled 2021-05-15 (×3): qty 1

## 2021-05-15 MED ORDER — CEFTRIAXONE SODIUM 2 G IJ SOLR
2.0000 g | INTRAMUSCULAR | Status: AC
Start: 2021-05-15 — End: 2021-05-23
  Administered 2021-05-15 – 2021-05-23 (×9): 2 g via INTRAVENOUS
  Filled 2021-05-15: qty 2
  Filled 2021-05-15: qty 20
  Filled 2021-05-15 (×3): qty 2
  Filled 2021-05-15: qty 20
  Filled 2021-05-15 (×2): qty 2
  Filled 2021-05-15 (×2): qty 20

## 2021-05-15 MED ORDER — TRAVASOL 10 % IV SOLN
INTRAVENOUS | Status: AC
  Filled 2021-05-15: qty 556.8

## 2021-05-15 NOTE — Progress Notes (Signed)
PHARMACY - TOTAL PARENTERAL NUTRITION CONSULT NOTE  Indication: s/p esophageal perforation and repair  Patient Measurements: Height: '5\' 1"'$  (154.9 cm) Weight: 66 kg (145 lb 8.1 oz) IBW/kg (Calculated) : 47.8   Body mass index is 27.49 kg/m.  Assessment:  80 y/o female with recent lap chole for symptomatic cholelithiasis on 7/21. PMH including HTN, HLD, DM, R breast cancer, and CAD. Patient presented on 7/23 with AMS and unresponsiveness - was complaining of abdominal discomfort and then patient became unresponsive and slumped on table.  S/p gall bladder surgery and esophageal perforation-repaired 7/24.  Pharmacy consulted for TPN management.  Glucose / Insulin: DM with A1c 7.4% on Lantus 24/d PTA. CBGs controlled. Utilized 8 units SSI in 24 hrs + 70 units insulin in TPN  Electrolytes: Na down to low normal, CL 113 and low CO2 (max acetate in TPN), K 3.8 post 3 runs, CoCa high normal at 10.3 (no Ca in TPN), others WNL (Phos low normal) Renal: septic ATN s/p CRRT (end 7/25) - SCr < 1, BUN 36 Hepatic: LFTs normalizing, tbili / WNL, albumin 1.5, prealbumin 6.1, TG down to 113 Intake / Output; MIVF:  UOP 726m, chest tube 214m net +4.8L, +BM GI Imaging: - 7/23 CT: no acute abnormalities  GI Surgeries / Procedures:  - 7/21: s/p laparoscopic cholecystectomy and LOA - 7/24: s/p R thoracotomy, repair of esophageal perf with muscle flap, drainage of empyema and decortication of R lung  Central access: PICC 7/26 (double lumen) TPN start date: 05/07/21  Nutritional Goals (per RD rec on 8/2): kCal: 1750-2030 , Protein: 87-116 , Fluid: >=1.8L  Current Nutrition:  TPN Advance to soft diet 8/3  Plan:  Discussed with CVTS, 1/2 TPN and monitor PO intake - TPN at 40 ml/hr Electrolytes in TPN: add low dose Na 2569mL, K 41m1m, Ca 0mEq58msince 7/30, Mg 9mEq/62mPhos 25mmol7mmax acetate - increase lytes with reduced TPN rate Add standard MVI and trace elements to TPN Continue CVTS SSI Q6H and  reduce regular insulin in TPN to 35 units KPhos Neutral '250mg'$  PO x3 doses Monitor TPN labs on Mon/Thurs RD consult for calorie count  Laportia Carley D. Georgian Mcclory, PMina MarbleD, BCPS, BCCCP [id number], 10:36 AM

## 2021-05-15 NOTE — Progress Notes (Signed)
Pharmacy Note: Antimicrobial Stewardship  Meropenem 7/31>> 8/3 Unasyn 7/26> 7/31 Zosyn 7/23 > 7/26 Vanco 8/1 >> 8/3 Eraxis 7/23 >  Ceftriaxone 8/3 >> Metronidazole 8/3>>    7/30 blood x2 neg 7/24 blood x2 > neg 7/23 pleural fluid > strep salivarius (pan S), strep mitas- sens to Ceftriaxone, rare candida albicans 7/23 MRSA PCR negative   Talked with Dr.Icard about de-escalating from vancomycin and meropenem to ceftriaxone and metronidazole for pleural empyema. Recommendation accepted. Start Ceftriaxone 2gm IV q24h Start Metronidazole 500 mg IV q12h Continue anidulafungin 100 mg IV q24h    Lestine Box, PharmD PGY2 Infectious Diseases Pharmacy Resident   Please check [email or web address] for unit-specific pharmacy phone numbers

## 2021-05-15 NOTE — Progress Notes (Signed)
10 Days Post-Op Procedure(s) (LRB): THORACOTOMY MAJOR REPAIR PERFORATED ESOPHAGUS (Right) Subjective: No specific complaints. Drinking clear liquids without difficulty. Some pain from incision and tubes.  Objective: Vital signs in last 24 hours: Temp:  [97.5 F (36.4 C)-99.9 F (37.7 C)] 99.9 F (37.7 C) (08/02 2300) Pulse Rate:  [81-101] 89 (08/03 0400) Cardiac Rhythm: Normal sinus rhythm (08/02 2000) Resp:  [12-16] 16 (08/03 0400) BP: (107-149)/(61-115) 119/65 (08/03 0400) SpO2:  [93 %-100 %] 96 % (08/03 0400) Weight:  [66 kg] 66 kg (08/03 0400)  Hemodynamic parameters for last 24 hours: CVP:  [16 mmHg] 16 mmHg  Intake/Output from previous day: 08/02 0701 - 08/03 0700 In: 2687.2 [I.V.:1685.8; Blood:429; IV Piggyback:572.4] Out: 1020 [Urine:1000; Chest Tube:20] Intake/Output this shift: No intake/output data recorded.  General appearance: alert and cooperative Neurologic: intact Heart: regular rate and rhythm Lungs: diminished breath sounds bibasilar Abdomen: soft, non-tender; bowel sounds normal; no masses,  no organomegaly Extremities: edema mild Wound: dressing dry Chest tube output minimal serous.  Lab Results: Recent Labs    05/14/21 0738 05/15/21 0359  WBC 26.3* 22.4*  HGB 7.9* 9.0*  HCT 23.9* 26.7*  PLT 217 241   BMET:  Recent Labs    05/14/21 0540 05/15/21 0359  NA 141 137  K 3.5 3.8  CL 115* 113*  CO2 19* 18*  GLUCOSE 157* 139*  BUN 34* 32*  CREATININE 0.77 0.72  CALCIUM 8.4* 8.3*    PT/INR: No results for input(s): LABPROT, INR in the last 72 hours. ABG    Component Value Date/Time   PHART 7.415 05/06/2021 0113   HCO3 27.8 05/07/2021 0516   TCO2 29 05/07/2021 0516   ACIDBASEDEF 14.0 (H) 05/04/2021 2049   O2SAT 96.0 05/07/2021 0516   CBG (last 3)  Recent Labs    05/14/21 1131 05/14/21 1845 05/15/21 0027  GLUCAP 128* 180* 131*    Assessment/Plan: S/P Procedure(s) (LRB): THORACOTOMY MAJOR REPAIR PERFORATED ESOPHAGUS  (Right)  POD 10 Hemodynamically stable in sinus rhythm.  Tmax 99.9, WBC down to 22.4. Continue antibiotic. I think aeration of right lung looks a little worse than yesterday. Needs to continue IS, start ambulating.  DC anterior chest tube today. Will consider removing the remaining tube tomorrow.   Severe protein/calorie malnutrition: Advance diet to soft. Discontinue TNA when taking adequate po nutrition. She can't have a feeding tube.  Probably transfer to 4E tomorrow if stable and all chest tubes are out.  Hgb improved after transfusion.   LOS: 11 days    Gaye Pollack 05/15/2021

## 2021-05-15 NOTE — Progress Notes (Addendum)
NAME:  Dana Coleman, MRN:  [id number], DOB:  09-18-1941, LOS: 57 ADMISSION DATE:  05/04/2021, CONSULTATION DATE:  7/23 REFERRING MD:  Ralene Bathe, CHIEF COMPLAINT:  Dyspnea   History of Present Illness:  80 y/o female admitted for empyema after a lap chole, noted to have esophageal perforation.  Pertinent  Medical History  DM2 CAD Hypertension Hyperlipidemia Prior R breast cancer  Significant Hospital Events: Including procedures, antibiotic start and stop dates in addition to other pertinent events   7/23 admitted intubated. Pigtail by CCS in ED -- interestingly seems to have bilious outpt 7/24 Starting CRRT. Abundant GPCs in pleural fluid 7/24 1.  Right thoracotomy 2.  Repair of esophageal perforation with intercostal pedicled muscle flap. 3.  Drainage of empyema and decortication of right lung 7/25 early AM EKG changes, trop leak, probable inferiror NSTEMI, not candidate for intervention.  Echo with mildly reduced LV, large RV ?cause 7/26 extubated 7/29 up out of bed, rising WBC 7/30 blood culture >  7/31 WBC up again, cough with mucus production, chest pain 8/1 elevated WBC, added Vanco, MRSA swab pending, CT stripped from clot by [facility]  8/2 pain control, OOB, passed swallow, started clears  8/3 mobility   Interim History / Subjective:   Pain better controlled. Using oxy regularly. BM yesterday   Objective   Blood pressure 119/65, pulse 89, temperature 99 F (37.2 C), temperature source Oral, resp. rate 16, height '5\' 1"'$  (1.549 m), weight 66 kg, SpO2 96 %.        Intake/Output Summary (Last 24 hours) at 05/15/2021 1031 Last data filed at 05/15/2021 0700 Gross per 24 hour  Intake 2348.38 ml  Output 700 ml  Net 1648.38 ml   Filed Weights   05/10/21 0500 05/14/21 0444 05/15/21 0400  Weight: 60.5 kg 65.6 kg 66 kg    Examination:  General: elderly fm, resting in bed  HENT: NCAT, tracking  PULM: diminished BL, right worse than left in base  CV: RRR, s1 s2  GI: soft nt  nd  MSK: normal bulk and tone  Neuro: AAOx3   Chest x-ray: reviewed   Resolved Hospital Problem list     Assessment & Plan:   Empyema, polymicrobial (strep and candida albicans) due to esophageal perforation status postrepair Bleeding from chest tube, small hemothorax, clot and chest tube (improved)  Plan: De-escalate abx regimen Discussed with pharmacy  Follow wbc and temp curve  Off heparin  CT management per Dr. Cyndia Bent  Continue mobilization  OOB and UIC   Pain post op Tylenol, oxy, prn fentanyl   AKI: resolved Follow up UOP and BMET   Rising WBC 7/31 concern for pneumonia with cough with mucus production, increased interstitial markings on R in CXR Possible small forming right-sided hemothorax, chest tube stripped on 05/13/2021 Plan: Abx  Ct in place  Possibly removing last one tomorrow  Physical deconditioning OOB PT OT   NSTEMI with WMA and dilated RV Tele, supportive care   Atrial flutter Continue telemetry Heparin stopped on 8/1 due to chest tube bleeding   Hyperglycemia with DM2; one isolated elevation this morning SSI plus lantus   Best Practice (right click and "Reselect all SmartList Selections" daily)   Diet/type: clear liquids and TPN DVT prophylaxis: systemic heparin GI prophylaxis: N/A Lines: Central line and yes and it is still needed Foley:  N/A Code Status:  full code Last date of multidisciplinary goals of care discussion [n/a]  Labs   CBC: Recent Labs  Lab 05/11/21 0612 05/12/21  [id number] 05/13/21 0550 05/14/21 0738 05/15/21 0359  WBC 26.6* 28.8* 26.9* 26.3* 22.4*  NEUTROABS 23.5* 25.3* 25.3*  --   --   HGB 7.5* 8.3* 7.6* 7.9* 9.0*  HCT 21.9* 24.9* 22.5* 23.9* 26.7*  MCV 90.9 89.9 90.4 92.3 89.9  PLT 129* 134* 177 217 [id number]    Basic Metabolic Panel: Recent Labs  Lab 05/10/21 0041 05/11/21 0612 05/11/21 0909 05/12/21 0138 05/13/21 0550 05/14/21 0540 05/15/21 0359  NA 142 139 145 148* 147* 141 137  K 3.9 5.2* 3.9 3.9  4.8 3.5 3.8  CL 110 110 114* 118* 119* 115* 113*  CO2 25 23 21* 19* 19* 19* 18*  GLUCOSE 205* 627* 204* 184* 177* 157* 139*  BUN 25* 29* 31* 32* 36* 34* 32*  CREATININE 0.78 0.74 0.73 0.90 0.81 0.77 0.72  CALCIUM 9.0 8.4* 8.9 8.5* 8.5* 8.4* 8.3*  MG 1.6* 2.0 1.8 2.1 2.2  --   --   PHOS 3.8 6.3* 3.4  --  3.4 2.7 2.5   GFR: Estimated Creatinine Clearance: 49.6 mL/min (by C-G formula based on SCr of 0.72 mg/dL). Recent Labs  Lab 05/12/21 0138 05/13/21 0550 05/14/21 0738 05/15/21 0359  WBC 28.8* 26.9* 26.3* 22.4*    Liver Function Tests: Recent Labs  Lab 05/09/21 0405 05/10/21 0041 05/11/21 0612 05/12/21 0138 05/13/21 0550  AST 89* 53* 39 38 42*  ALT 138* 92* 54* 48* 44  ALKPHOS 84 74 62 54 55  BILITOT 0.6 0.4 0.3 0.3 0.6  PROT 5.3* 5.2* 5.0* 5.1* 5.4*  ALBUMIN 1.9* 1.7* 1.5* 1.5* 1.5*   No results for input(s): LIPASE, AMYLASE in the last 168 hours. No results for input(s): AMMONIA in the last 168 hours.  ABG    Component Value Date/Time   PHART 7.415 05/06/2021 0113   PCO2ART 42.1 05/06/2021 0113   PO2ART 105 05/06/2021 0113   HCO3 27.8 05/07/2021 0516   TCO2 29 05/07/2021 0516   ACIDBASEDEF 14.0 (H) 05/04/2021 2049   O2SAT 96.0 05/07/2021 0516     Coagulation Profile: No results for input(s): INR, PROTIME in the last 168 hours.   Cardiac Enzymes: No results for input(s): CKTOTAL, CKMB, CKMBINDEX, TROPONINI in the last 168 hours.  HbA1C: Hemoglobin A1C  Date/Time Value Ref Range Status  12/18/2020 10:20 AM 6.8 (A) 4.0 - 5.6 % Final  08/14/2020 01:35 PM 7.2 (A) 4.0 - 5.6 % Final   HbA1c, POC (controlled diabetic range)  Date/Time Value Ref Range Status  05/15/2020 03:27 PM 8.2 (A) 0.0 - 7.0 % Final  10/28/2019 02:30 PM 8.2 (A) 0.0 - 7.0 % Final   Hgb A1c MFr Bld  Date/Time Value Ref Range Status  04/26/2021 11:21 AM 7.4 (H) 4.8 - 5.6 % Final    Comment:    (NOTE) Pre diabetes:          5.7%-6.4%  Diabetes:              >6.4%  Glycemic control  for   <7.0% adults with diabetes     CBG: Recent Labs  Lab 05/14/21 0703 05/14/21 1131 05/14/21 1845 05/15/21 0027 05/15/21 0807  GLUCAP 153* 128* 180* 131* [place] Aeris Hersman, DO [facility] 05/15/2021 10:31 AM

## 2021-05-15 NOTE — Progress Notes (Signed)
Physical Therapy Treatment Patient Details Name: Dana Coleman MRN: [id number] DOB: May 07, 1941 Today's Date: 05/15/2021    History of Present Illness Pt is a 80 yo female admitted on 05/04/21 for AMS. HX of cholecystectomy on 7/21.  Intubated upon admission, 7/24 Started CRRT, Esophageal perforation with septic shock post flap repair, drainage of empyema and decortication of R lung with chest tube placed.  7/25 EKG changes and probable nstemi. Extubated 7/26. PMH includes: R breast CA, DM, CAD, HTN.    PT Comments    Pt was fatigued for sitting the morning in the chair.  She has been slow to progress toward goals.  Emphasis on scoot to Edge of the chair, sit to stands, standing during peri-care activity and progression of gait stability in the room with the EVA walker.    Follow Up Recommendations  CIR     Equipment Recommendations  None recommended by PT    Recommendations for Other Services       Precautions / Restrictions Precautions Precautions: Fall    Mobility  Bed Mobility Overal bed mobility: Needs Assistance Bed Mobility: Sit to Supine       Sit to supine: Max assist;+2 for physical assistance;Mod assist   General bed mobility comments: cues for sequencing back to bed, needed both truncal and LE assist due to flank pain.    Transfers Overall transfer level: Needs assistance Equipment used:  (EVA) Transfers: Sit to/from Stand Sit to Stand: Mod assist;+2 physical assistance         General transfer comment: cues and assist to scoot asymmetrically to Dana Coleman of the chair.  cues for hand placement and assist forward and up into the EVA walker  Ambulation/Gait Ambulation/Gait assistance: Mod assist;+2 physical assistance Gait Distance (Feet): 12 Feet (from recliner around to the other side of the bed.) Assistive device:  (EVA walker) Gait Pattern/deviations: Step-to pattern;Step-through pattern   Gait velocity interpretation: <1.31 ft/sec, indicative of  household ambulator General Gait Details: weak unsteady gait pattern, heavy use of the EVA.  Very dyspneic and fatigued.   Stairs             Wheelchair Mobility    Modified Rankin (Stroke Patients Only)       Balance Overall balance assessment: Needs assistance Sitting-balance support: No upper extremity supported;Feet supported Sitting balance-Leahy Scale: Fair     Standing balance support: Bilateral upper extremity supported;During functional activity Standing balance-Leahy Scale: Poor Standing balance comment: relies on BUE and external support                            Cognition Arousal/Alertness: Awake/alert Behavior During Therapy: WFL for tasks assessed/performed Overall Cognitive Status: Impaired/Different from baseline                       Memory: Decreased short-term memory Following Commands: Follows one step commands consistently;Follows one step commands with increased time;Follows multi-step commands inconsistently   Awareness: Emergent Problem Solving: Slow processing;Decreased initiation;Difficulty sequencing;Requires verbal cues        Exercises      General Comments General comments (skin integrity, edema, etc.): vss on 3-4 L [place], even with dyspnea      Pertinent Vitals/Pain Pain Assessment: Faces Faces Pain Scale: Hurts even more Pain Location: R flank Pain Descriptors / Indicators: Discomfort;Grimacing;Moaning Pain Intervention(s): Monitored during session;Limited activity within patient's tolerance    Home Living  Prior Function            PT Goals (current goals can now be found in the care plan section) Acute Rehab PT Goals Patient Stated Goal: get back to housework PT Goal Formulation: With patient Time For Goal Achievement: 05/21/21 Potential to Achieve Goals: Good Progress towards PT goals: Progressing toward goals    Frequency    Min 3X/week      PT Plan  Current plan remains appropriate    Co-evaluation              AM-PAC PT "6 Clicks" Mobility   Outcome Measure  Help needed turning from your back to your side while in a flat bed without using bedrails?: Total Help needed moving from lying on your back to sitting on the side of a flat bed without using bedrails?: Total Help needed moving to and from a bed to a chair (including a wheelchair)?: Total Help needed standing up from a chair using your arms (e.g., wheelchair or bedside chair)?: Total Help needed to walk in hospital room?: Total Help needed climbing 3-5 steps with a railing? : Total 6 Click Score: 6    End of Session   Activity Tolerance: Patient limited by fatigue;Patient limited by pain Patient left: in bed;with call bell/phone within reach;with bed alarm set Nurse Communication: Mobility status PT Visit Diagnosis: Other abnormalities of gait and mobility (R26.89);Muscle weakness (generalized) (M62.81);Difficulty in walking, not elsewhere classified (R26.2);Pain Pain - Right/Left: Right Pain - part of body:  (flank)     Time: [id number] PT Time Calculation (min) (ACUTE ONLY): 18 min  Charges:  $Gait Training: 8-22 mins                     05/15/2021  Ginger Carne., PT Acute Rehabilitation Services [phone]  (pager) [phone]  (office)   Tessie Fass Emmilia Sowder 05/15/2021, 3:42 PM

## 2021-05-16 ENCOUNTER — Inpatient Hospital Stay (HOSPITAL_COMMUNITY): Payer: Medicare Other

## 2021-05-16 DIAGNOSIS — J9602 Acute respiratory failure with hypercapnia: Secondary | ICD-10-CM | POA: Diagnosis not present

## 2021-05-16 DIAGNOSIS — N179 Acute kidney failure, unspecified: Secondary | ICD-10-CM | POA: Diagnosis not present

## 2021-05-16 LAB — GLUCOSE, CAPILLARY
Glucose-Capillary: 118 mg/dL — ABNORMAL HIGH (ref 70–99)
Glucose-Capillary: 119 mg/dL — ABNORMAL HIGH (ref 70–99)
Glucose-Capillary: 123 mg/dL — ABNORMAL HIGH (ref 70–99)
Glucose-Capillary: 168 mg/dL — ABNORMAL HIGH (ref 70–99)

## 2021-05-16 LAB — COMPREHENSIVE METABOLIC PANEL
ALT: 271 U/L — ABNORMAL HIGH (ref 0–44)
AST: 352 U/L — ABNORMAL HIGH (ref 15–41)
Albumin: 1.3 g/dL — ABNORMAL LOW (ref 3.5–5.0)
Alkaline Phosphatase: 115 U/L (ref 38–126)
Anion gap: 8 (ref 5–15)
BUN: 33 mg/dL — ABNORMAL HIGH (ref 8–23)
CO2: 15 mmol/L — ABNORMAL LOW (ref 22–32)
Calcium: 8.8 mg/dL — ABNORMAL LOW (ref 8.9–10.3)
Chloride: 116 mmol/L — ABNORMAL HIGH (ref 98–111)
Creatinine, Ser: 0.79 mg/dL (ref 0.44–1.00)
GFR, Estimated: >60 mL/min (ref >60)
Glucose, Bld: 112 mg/dL — ABNORMAL HIGH (ref 70–99)
Potassium: 5.4 mmol/L — ABNORMAL HIGH (ref 3.5–5.1)
Sodium: 139 mmol/L (ref 135–145)
Total Bilirubin: 0.8 mg/dL (ref 0.3–1.2)
Total Protein: 5.3 g/dL — ABNORMAL LOW (ref 6.5–8.1)

## 2021-05-16 LAB — CULTURE, BLOOD (ROUTINE X 2)
Culture: NO GROWTH
Culture: NO GROWTH
Special Requests: ADEQUATE

## 2021-05-16 LAB — CBC
HCT: 26.8 % — ABNORMAL LOW (ref 36.0–46.0)
Hemoglobin: 8.5 g/dL — ABNORMAL LOW (ref 12.0–15.0)
MCH: 29.5 pg (ref 26.0–34.0)
MCHC: 31.7 g/dL (ref 30.0–36.0)
MCV: 93.1 fL (ref 80.0–100.0)
Platelets: 273 10*3/uL (ref 150–400)
RBC: 2.88 MIL/uL — ABNORMAL LOW (ref 3.87–5.11)
RDW: 17.1 % — ABNORMAL HIGH (ref 11.5–15.5)
WBC: 19.3 10*3/uL — ABNORMAL HIGH (ref 4.0–10.5)
nRBC: 0.1 % (ref 0.0–0.2)

## 2021-05-16 LAB — PHOSPHORUS: Phosphorus: 3.8 mg/dL (ref 2.5–4.6)

## 2021-05-16 LAB — MAGNESIUM: Magnesium: 2.1 mg/dL (ref 1.7–2.4)

## 2021-05-16 MED ORDER — TRAVASOL 10 % IV SOLN
INTRAVENOUS | Status: AC
  Filled 2021-05-16: qty 556.8

## 2021-05-16 MED ORDER — FUROSEMIDE 10 MG/ML IJ SOLN
40.0000 mg | Freq: Once | INTRAMUSCULAR | Status: AC
  Administered 2021-05-16: 40 mg via INTRAVENOUS
  Filled 2021-05-16: qty 4

## 2021-05-16 MED ORDER — TRAVASOL 10 % IV SOLN
INTRAVENOUS | Status: DC
  Filled 2021-05-16: qty 556.8

## 2021-05-16 NOTE — Progress Notes (Signed)
      [address]       [place] [address]             [phone]      Resting and visiting with family  BP (!) 107/59   Pulse 88   Temp (P) 98.6 F (37 C)   Resp 16   Ht '5\' 1"'$  (1.549 m)   Wt 66 kg   SpO2 100%   BMI 27.49 kg/m   Intake/Output Summary (Last 24 hours) at 05/16/2021 1719 Last data filed at 05/16/2021 1400 Gross per 24 hour  Intake 1129.9 ml  Output 1042 ml  Net 87.9 ml   Continue with POs, antibiotics, mobilization  Berdene Askari C. Roxan Hockey, MD [facility] [phone]

## 2021-05-16 NOTE — Progress Notes (Signed)
Nutrition Follow-up:  Upon arrival to see pt today, pt is sleeping, sitting up in recliner.   Pt remains very weak and with poor appetite. Recorded po intake 0-25% of meals. Ate bites with assistance this AM. Pt with Ensure Enlive at bedside, open with maybe a sip taken out of it. Offered pt some more Ensure which she accepted.   Spoke with RN as RD has noted that pt with UE weakness which will is impeding ability to self-feed. RN has noticed this a well and has spoke with OT regarding exercises to strengthen UE. RN going to assist with feeding as able.   RN also indicates that pt's family is bringing in her dentures today which will make chewing easier and may improve intake.  Potassium 5.4 (H) this AM; TPN held until new bag can be hung tonight. Noted pt received doses of potassium phosphate yesterday. Noted LFTs trending up as well   Interventions: Recommend increasing TPN back to goal rate until pt demonstrating adequate po intake (>50-60% of meals). Discussed recommendations with Pharmacist.   Continue Ensure Enlive po BID, each supplement provides 350 kcal and 20 grams of protein  Add Magic cup BID with meals, each supplement provides 290 kcal and 9 grams of protein  Feeding assistance at meals  Per MD, Pt is not candidate for Cortrak tube   Kerman Passey MS, RDN, LDN, [place] Registered Dietitian III Clinical Nutrition RD Pager and On-Call Pager Number Located in [place]

## 2021-05-16 NOTE — Progress Notes (Addendum)
PHARMACY - TOTAL PARENTERAL NUTRITION CONSULT NOTE  Indication: s/p esophageal perforation and repair  Patient Measurements: Height: '5\' 1"'$  (154.9 cm) Weight: 66 kg (145 lb 8.1 oz) IBW/kg (Calculated) : 47.8   Body mass index is 27.49 kg/m.  Assessment:  80 y/o female with recent lap chole for symptomatic cholelithiasis on 7/21. PMH including HTN, HLD, DM, R breast cancer, and CAD. Patient presented on 7/23 with AMS and unresponsiveness - was complaining of abdominal discomfort and then patient became unresponsive and slumped on table.  S/p gall bladder surgery and esophageal perforation-repaired 7/24.  Pharmacy consulted for TPN management.  Glucose / Insulin: DM with A1c 7.4% on Lantus 24/d PTA. CBGs controlled. Utilized 6 units SSI in 24 hrs + 35 units insulin in  1/2 TPN  Electrolytes: Na down to low normal, Cl up 116 (very little Cl- in TPN from chromium, otherwise acetate) and low CO2 (max acetate in TPN), K up 5.4,  CoCa up 11 (no Ca in TPN), others WNL  Renal: septic ATN s/p CRRT (end 7/25) - SCr < 1 and stable, BUN  overall at 33 Hepatic: LFTs significantly increased, tbili in normal limits, albumin down 1.3, prealbumin up 8.8, TG down to 113 Intake / Output; MIVF:  UOP 1.45 L, chest tube 90 mL, net +6 L, +BM GI Imaging: - 7/23 CT: no acute abnormalities  GI Surgeries / Procedures:  - 7/21: s/p laparoscopic cholecystectomy and LOA - 7/24: s/p R thoracotomy, repair of esophageal perf with muscle flap, drainage of empyema and decortication of R lung  Central access: PICC 7/26 (double lumen) TPN start date: 05/07/21  Nutritional Goals (per RD rec on 8/2): kCal: 1750-2030 , Protein: 87-116 , Fluid: >=1.8L  Current Nutrition:  TPN Advance to soft diet 8/3 - tolerating but low appetite *No feeding tube recommended due to esophageal injury  Plan:  Discussed with CCM team today - LFTs significant bumped and K accumulated. Will hold further TPN today to prevent further  accumulation and give liver less stimulation. Will resume TPN tonight at 1800.  As discussed with CVTS on 8/3, continuing with 1/2 TPN and monitor PO intake - TPN at 40 ml/hr. Electrolytes in TPN: continue low dose Na 61mq/L, decrease K to 25 mEq/L, Ca 031m/L since 7/30, Mg 37m58mL, Phos 72m51mL, max acetate  Add standard MVI and trace elements to TPN Continue CVTS SSI Q6H and reduce regular insulin in TPN to 25 units Monitor TPN labs on Mon/Thurs RD consult for calorie count *Will recheck BMP in AM to observe electrolytes.  JessSloan LeiterarmD, BCPS, BCCCP Clinical Pharmacist Please refer to [facility] PM[facility] Heightsbers 05/16/2021, 8:53 AM

## 2021-05-16 NOTE — Progress Notes (Signed)
Occupational Therapy Treatment Patient Details Name: Dana Coleman MRN: [id number] DOB: December 30, 1940 Today's Date: 05/16/2021    History of present illness Pt is a 80 yo female admitted on 05/04/21 for AMS. HX of cholecystectomy on 7/21.  Intubated upon admission, 7/24 Started CRRT, Esophageal perforation with septic shock post flap repair, drainage of empyema and decortication of R lung with chest tube placed.  7/25 EKG changes and probable nstemi. Extubated 7/26. PMH includes: R breast CA, DM, CAD, HTN.   OT comments  Patient supine in bed, agreeable to OT with encouragement.  Pt remains limited by fatigue, weakness and pain in R flank.  Completing bed mobility with mod assist, transfers to recliner with mod assist +2 using RW and LB dressing with total assist.  Grooming with setup assist in recliner.  She requires increased time for all activities, multimodal cueing for multiple step commands and problem solving. Continue to recommend CIR.  Will follow.    Follow Up Recommendations  CIR;Supervision/Assistance - 24 hour    Equipment Recommendations  3 in 1 bedside commode    Recommendations for Other Services Rehab consult    Precautions / Restrictions Precautions Precautions: Fall Restrictions Weight Bearing Restrictions: No       Mobility Bed Mobility Overal bed mobility: Needs Assistance Bed Mobility: Rolling;Sidelying to Sit Rolling: Min assist Sidelying to sit: Mod assist;HOB elevated       General bed mobility comments: cueing for sequencing, requires assist to manage BLES fully off EOB and ascend trunk    Transfers Overall transfer level: Needs assistance Equipment used: Rolling walker (2 wheeled) Transfers: Sit to/from Stand Sit to Stand: Mod assist;+2 physical assistance Stand pivot transfers: Min assist;+2 physical assistance;+2 safety/equipment       General transfer comment: mod assist to power up and steady, with poor carryover of hand placement and  technique; min assist +2 to transfer to recliner    Balance Overall balance assessment: Needs assistance Sitting-balance support: No upper extremity supported;Feet supported Sitting balance-Leahy Scale: Fair Sitting balance - Comments: min guard   Standing balance support: Bilateral upper extremity supported;During functional activity Standing balance-Leahy Scale: Poor Standing balance comment: relies on BUE and external support                           ADL either performed or assessed with clinical judgement   ADL Overall ADL's : Needs assistance/impaired     Grooming: Wash/dry face;Set up;Sitting               Lower Body Dressing: Total assistance;+2 for physical assistance;Sit to/from stand   Toilet Transfer: Moderate assistance;+2 for physical assistance;+2 for safety/equipment;Stand-pivot;RW Toilet Transfer Details (indicate cue type and reason): simulated to recliner         Functional mobility during ADLs: Moderate assistance;+2 for physical assistance;+2 for safety/equipment;Rolling walker;Cueing for sequencing General ADL Comments: pt limited by weakness, decreased activity tolerance, impaired balance     Vision       Perception     Praxis      Cognition Arousal/Alertness: Awake/alert Behavior During Therapy: WFL for tasks assessed/performed Overall Cognitive Status: Impaired/Different from baseline Area of Impairment: Problem solving;Following commands;Awareness                       Following Commands: Follows one step commands consistently;Follows one step commands with increased time;Follows multi-step commands inconsistently   Awareness: Emergent Problem Solving: Slow processing;Decreased initiation;Difficulty sequencing;Requires verbal cues;Requires tactile  cues General Comments: pt initally reports july but able to correct to august with cueing, following simple commands but decreased problem solving        Exercises  Exercises: General Upper Extremity General Exercises - Upper Extremity Shoulder Flexion: AAROM;Both;5 reps;Supine Digit Composite Flexion: AROM;Both;5 reps;Supine   Shoulder Instructions       General Comments VSS on 4L [place], 131/70 at end of session with HR 97    Pertinent Vitals/ Pain       Pain Assessment: Faces Faces Pain Scale: Hurts little more Pain Location: R flank Pain Descriptors / Indicators: Discomfort;Grimacing;Moaning Pain Intervention(s): Monitored during session;Repositioned;Limited activity within patient's tolerance  Home Living                                          Prior Functioning/Environment              Frequency  Min 2X/week        Progress Toward Goals  OT Goals(current goals can now be found in the care plan section)  Progress towards OT goals: Progressing toward goals  Acute Rehab OT Goals Patient Stated Goal: get back to housework OT Goal Formulation: With patient  Plan Discharge plan remains appropriate;Frequency remains appropriate    Co-evaluation                 AM-PAC OT "6 Clicks" Daily Activity     Outcome Measure   Help from another person eating meals?: A Little Help from another person taking care of personal grooming?: A Little Help from another person toileting, which includes using toliet, bedpan, or urinal?: Total Help from another person bathing (including washing, rinsing, drying)?: A Lot Help from another person to put on and taking off regular upper body clothing?: A Lot Help from another person to put on and taking off regular lower body clothing?: Total 6 Click Score: 12    End of Session Equipment Utilized During Treatment: Oxygen;Rolling walker  OT Visit Diagnosis: Other abnormalities of gait and mobility (R26.89);Muscle weakness (generalized) (M62.81);Pain;Other symptoms and signs involving cognitive function Pain - Right/Left: Right Pain - part of body:  (Flank)   Activity  Tolerance Patient tolerated treatment well   Patient Left in chair;with call bell/phone within reach;with chair alarm set   Nurse Communication Mobility status        Time: [id number] OT Time Calculation (min): 22 min  Charges: OT General Charges $OT Visit: 1 Visit OT Treatments $Self Care/Home Management : 8-22 mins  [name], OT Acute Rehabilitation Services Pager [phone] Office [place] 05/16/2021, 10:35 AM

## 2021-05-16 NOTE — Progress Notes (Signed)
11 Days Post-Op Procedure(s) (LRB): THORACOTOMY MAJOR REPAIR PERFORATED ESOPHAGUS (Right) Subjective: Only complaint is of some pain. Slept overnight according to nurse. Tolerating soft diet but says she is taking liquids and jello so far, a little Ensure. Appetite not that good.  Objective: Vital signs in last 24 hours: Temp:  [98.2 F (36.8 C)-100.1 F (37.8 C)] 98.2 F (36.8 C) (08/04 0400) Pulse Rate:  [85-114] 88 (08/04 0400) Cardiac Rhythm: Normal sinus rhythm (08/04 0400) Resp:  [16] 16 (08/03 2000) BP: (100-131)/(59-68) 113/63 (08/04 0400) SpO2:  [87 %-99 %] 98 % (08/04 0400)  Hemodynamic parameters for last 24 hours:    Intake/Output from previous day: 08/03 0701 - 08/04 0700 In: 2207.7 [P.O.:300; I.V.:1377.4; IV Piggyback:530.3] Out: 1290 [Urine:1200; Chest Tube:90] Intake/Output this shift: Total I/O In: 573.7 [I.V.:473.6; IV Piggyback:100] Out: 550 [Urine:550]  General appearance: alert and cooperative Neurologic: intact Heart: regular rate and rhythm Lungs: diminished breath sounds bibasilar Wound: dressing dry Chest tube has no output.  Lab Results: Recent Labs    05/14/21 0738 05/15/21 0359  WBC 26.3* 22.4*  HGB 7.9* 9.0*  HCT 23.9* 26.7*  PLT 217 241   BMET:  Recent Labs    05/14/21 0540 05/15/21 0359  NA 141 137  K 3.5 3.8  CL 115* 113*  CO2 19* 18*  GLUCOSE 157* 139*  BUN 34* 32*  CREATININE 0.77 0.72  CALCIUM 8.4* 8.3*    PT/INR: No results for input(s): LABPROT, INR in the last 72 hours. ABG    Component Value Date/Time   PHART 7.415 05/06/2021 0113   HCO3 27.8 05/07/2021 0516   TCO2 29 05/07/2021 0516   ACIDBASEDEF 14.0 (H) 05/04/2021 2049   O2SAT 96.0 05/07/2021 0516   CBG (last 3)  Recent Labs    05/15/21 1120 05/15/21 1805 05/16/21 0016  GLUCAP 149* 175* 118*   CXR: persistent bilateral lower lobe atelectasis/consolidation and small pleural effusions.  Assessment/Plan:  POD 11  She remains hemodynamically  stable in sinus rhythm.  Tmax 100.1 last pm. Continues on Rocephin, Eraxis and Flagil. WBC ct pending this am.  She has been tolerating po liquids and soft diet and no chest tube output so will remove remaining tube. This should help pain and aid in mobilization.  She needs to continue working on IS, ambulation. ? CIR candidate.  Encourage po nutrition with supplements. Would continue some TNA until taking adequate nutrition po. She can't have a feeding tube with esophageal injury.  I think she could be transferred to 4E on the hospitalist service if ok with CCM. I will follow closely.  LOS: 12 days    Gaye Pollack 05/16/2021

## 2021-05-16 NOTE — Progress Notes (Signed)
   NAME:  Dana Coleman, MRN:  [id number], DOB:  Mar 06, 1941, LOS: 12 ADMISSION DATE:  05/04/2021, CONSULTATION DATE:  7/23 REFERRING MD:  Ralene Bathe, CHIEF COMPLAINT:  Dyspnea   History of Present Illness:  80 y/o female admitted for empyema after a lap chole, noted to have esophageal perforation.  Pertinent  Medical History  DM2 CAD Hypertension Hyperlipidemia Prior R breast cancer  Significant Hospital Events: Including procedures, antibiotic start and stop dates in addition to other pertinent events   7/23 admitted intubated. Pigtail by CCS in ED -- interestingly seems to have bilious outpt 7/24 Starting CRRT. Abundant GPCs in pleural fluid 7/24 1.  Right thoracotomy 2.  Repair of esophageal perforation with intercostal pedicled muscle flap. 3.  Drainage of empyema and decortication of right lung 7/25 early AM EKG changes, trop leak, probable inferiror NSTEMI, not candidate for intervention.  Echo with mildly reduced LV, large RV ?cause 7/26 extubated 7/29 up out of bed, rising WBC 7/30 blood culture >  7/31 WBC up again, cough with mucus production, chest pain 8/1 elevated WBC, added Vanco, MRSA swab pending, CT stripped from clot by [facility]  8/2 pain control, OOB, passed swallow, started clears  8/3 mobility   Interim History / Subjective:  No complaints. CT removed this am    Objective   Blood pressure (Abnormal) 107/59, pulse 88, temperature 98.2 F (36.8 C), temperature source Oral, resp. rate 16, height '5\' 1"'$  (1.549 m), weight 66 kg, SpO2 100 %.        Intake/Output Summary (Last 24 hours) at 05/16/2021 0804 Last data filed at 05/16/2021 0700 Gross per 24 hour  Intake 2236.58 ml  Output 1290 ml  Net 946.58 ml   Filed Weights   05/10/21 0500 05/14/21 0444 05/15/21 0400  Weight: 60.5 kg 65.6 kg 66 kg    Examination: General very deconditioned 80 year old female. Resting in  bed HENT NCAT no JVD Pulm dec bases Card rrr Abd soft Ext warm and dry dependent  edema Neuro intact generalized weakness   Resolved Hospital Problem list   Bleeding from chest tube, small hemothorax, clot and chest tube (improved)  AKI   Assessment & Plan:   Empyema, polymicrobial (strep and candida albicans) due to esophageal perforation status post repair Plan: CT per thoracic surg (last one removed today) Pain management Currently on ceftriazone, flagyl and anidulafungin stop date TBD Cont to trend fever and WBC curve  Physical deconditioning Plan Cont mobility and PT   NSTEMI with WMA and dilated RV Plan Cont tele   Atrial flutter Heparin stopped on 8/1 due to chest tube bleeding  Plan Cont tele  Fluid and electrolyte imbalance: hypernatremia, hyperkalemia, hyperchloremia w/ NAG metabolic acidosis Plan Will d/w pharmacy to adjust TPN Am chem   Hyperglycemia with DM2; one isolated elevation this morning Plan Cont ssi and lantus   Best Practice (right click and "Reselect all SmartList Selections" daily)   Diet/type: clear liquids and TPN DVT prophylaxis: systemic heparin GI prophylaxis: N/A Lines: Central line and yes and it is still needed Foley:  N/A Code Status:  full code Last date of multidisciplinary goals of care discussion [n/a]

## 2021-05-16 NOTE — Progress Notes (Signed)
Per IV team, blood can be drawn from port on PICC line that does not have TPN in line.  Hold TPN for 5 min, flush 43m NS, waste 10 ml blood, then can draw blood.

## 2021-05-16 NOTE — Progress Notes (Signed)
IP rehab admissions - I am re-opening the case with insurance carrier requesting acute inpatient rehab admission anticipating that this patient may be ready in the next 2-4 days.  Call me for questions.  [phone]

## 2021-05-17 ENCOUNTER — Inpatient Hospital Stay (HOSPITAL_COMMUNITY): Payer: Medicare Other

## 2021-05-17 DIAGNOSIS — K223 Perforation of esophagus: Secondary | ICD-10-CM | POA: Diagnosis not present

## 2021-05-17 LAB — COMPREHENSIVE METABOLIC PANEL
ALT: 180 U/L — ABNORMAL HIGH (ref 0–44)
AST: 103 U/L — ABNORMAL HIGH (ref 15–41)
Albumin: 1.2 g/dL — ABNORMAL LOW (ref 3.5–5.0)
Alkaline Phosphatase: 99 U/L (ref 38–126)
Anion gap: 7 (ref 5–15)
BUN: 24 mg/dL — ABNORMAL HIGH (ref 8–23)
CO2: 23 mmol/L (ref 22–32)
Calcium: 8.4 mg/dL — ABNORMAL LOW (ref 8.9–10.3)
Chloride: 106 mmol/L (ref 98–111)
Creatinine, Ser: 0.74 mg/dL (ref 0.44–1.00)
GFR, Estimated: >60 mL/min (ref >60)
Glucose, Bld: 128 mg/dL — ABNORMAL HIGH (ref 70–99)
Potassium: 3.7 mmol/L (ref 3.5–5.1)
Sodium: 136 mmol/L (ref 135–145)
Total Bilirubin: 0.3 mg/dL (ref 0.3–1.2)
Total Protein: 5.2 g/dL — ABNORMAL LOW (ref 6.5–8.1)

## 2021-05-17 LAB — GLUCOSE, CAPILLARY
Glucose-Capillary: 119 mg/dL — ABNORMAL HIGH (ref 70–99)
Glucose-Capillary: 148 mg/dL — ABNORMAL HIGH (ref 70–99)
Glucose-Capillary: 174 mg/dL — ABNORMAL HIGH (ref 70–99)
Glucose-Capillary: 177 mg/dL — ABNORMAL HIGH (ref 70–99)
Glucose-Capillary: 207 mg/dL — ABNORMAL HIGH (ref 70–99)

## 2021-05-17 MED ORDER — ACETAMINOPHEN 325 MG PO TABS
650.0000 mg | ORAL_TABLET | ORAL | Status: DC | PRN
  Administered 2021-05-18 – 2021-05-19 (×2): 650 mg via ORAL
  Filled 2021-05-17 (×2): qty 2

## 2021-05-17 MED ORDER — INSULIN ASPART 100 UNIT/ML IJ SOLN
0.0000 [IU] | INTRAMUSCULAR | Status: DC
  Administered 2021-05-17: 4 [IU] via SUBCUTANEOUS
  Administered 2021-05-17: 2 [IU] via SUBCUTANEOUS
  Administered 2021-05-17: 8 [IU] via SUBCUTANEOUS
  Administered 2021-05-17: 4 [IU] via SUBCUTANEOUS
  Administered 2021-05-18: 8 [IU] via SUBCUTANEOUS
  Administered 2021-05-18 (×4): 4 [IU] via SUBCUTANEOUS
  Administered 2021-05-18: 2 [IU] via SUBCUTANEOUS
  Administered 2021-05-19: 8 [IU] via SUBCUTANEOUS
  Administered 2021-05-19 (×2): 4 [IU] via SUBCUTANEOUS
  Administered 2021-05-19: 12 [IU] via SUBCUTANEOUS
  Administered 2021-05-20: 2 [IU] via SUBCUTANEOUS
  Administered 2021-05-20: 12 [IU] via SUBCUTANEOUS
  Administered 2021-05-20: 8 [IU] via SUBCUTANEOUS
  Administered 2021-05-20: 4 [IU] via SUBCUTANEOUS
  Administered 2021-05-20: 8 [IU] via SUBCUTANEOUS
  Administered 2021-05-21: 4 [IU] via SUBCUTANEOUS
  Administered 2021-05-21: 2 [IU] via SUBCUTANEOUS
  Administered 2021-05-21: 8 [IU] via SUBCUTANEOUS
  Administered 2021-05-21 – 2021-05-22 (×2): 4 [IU] via SUBCUTANEOUS
  Administered 2021-05-22: 2 [IU] via SUBCUTANEOUS
  Administered 2021-05-22 (×2): 4 [IU] via SUBCUTANEOUS
  Administered 2021-05-22: 8 [IU] via SUBCUTANEOUS
  Administered 2021-05-23 (×2): 4 [IU] via SUBCUTANEOUS
  Administered 2021-05-23 – 2021-05-24 (×5): 2 [IU] via SUBCUTANEOUS
  Administered 2021-05-24: 4 [IU] via SUBCUTANEOUS
  Administered 2021-05-26 – 2021-05-27 (×2): 2 [IU] via SUBCUTANEOUS

## 2021-05-17 MED ORDER — TRAVASOL 10 % IV SOLN
INTRAVENOUS | Status: AC
  Filled 2021-05-17: qty 1113.6

## 2021-05-17 NOTE — Plan of Care (Signed)
  Problem: Clinical Measurements: Goal: Respiratory complications will improve Outcome: Progressing Goal: Cardiovascular complication will be avoided Outcome: Progressing   Problem: Activity: Goal: Risk for activity intolerance will decrease Outcome: Progressing   Problem: Elimination: Goal: Will not experience complications related to urinary retention Outcome: Progressing   Problem: Pain Managment: Goal: General experience of comfort will improve Outcome: Progressing   Problem: Nutrition: Goal: Adequate nutrition will be maintained Outcome: Not Progressing

## 2021-05-17 NOTE — Progress Notes (Signed)
PHARMACY - TOTAL PARENTERAL NUTRITION CONSULT NOTE  Indication: s/p esophageal perforation and repair  Patient Measurements: Height: '5\' 1"'$  (154.9 cm) Weight: 66 kg (145 lb 8.1 oz) IBW/kg (Calculated) : 47.8   Body mass index is 27.49 kg/m.  Assessment:  80 y/o female with recent lap chole for symptomatic cholelithiasis on 7/21. PMH including HTN, HLD, DM, R breast cancer, and CAD. Patient presented on 7/23 with AMS and unresponsiveness - was complaining of abdominal discomfort and then patient became unresponsive and slumped on table.  S/p gall bladder surgery and esophageal perforation-repaired 7/24.  Pharmacy consulted for TPN management.  Glucose / Insulin: DM with A1c 7.4% on Lantus 24/d PTA. CBGs remain controlled with reduction to 25 units in TPN. Utilized 6 units SSI in 24 hrs + 35 units insulin in  1/2 TPN  Electrolytes: Na down to low normal, Cl up 116 (very little Cl- in TPN from chromium, otherwise acetate) and low CO2 (max acetate in TPN), K down to 3.7 after holding TPN and reducing K,  CoCa 10.6 (no Ca in TPN), Cl improved at 106, others WNL  Renal: septic ATN s/p CRRT (end 7/25) - SCr < 1 and stable, BUN down at 24 (s/p Lasix) Hepatic: LFTs significantly increased 8/4 (decreased with holding TPN for 6 hours and Lasix), tbili in normal limits, albumin down 1.3, prealbumin up 8.8, TG down to 113 Intake / Output; MIVF:  UOP 1L, chest tube 0 mL (removing), net +6 L, +BM -  diarrhea with po intake GI Imaging: - 7/23 CT: no acute abnormalities  GI Surgeries / Procedures:  - 7/21: s/p laparoscopic cholecystectomy and LOA - 7/24: s/p R thoracotomy, repair of esophageal perf with muscle flap, drainage of empyema and decortication of R lung  Central access: PICC 7/26 (double lumen) TPN start date: 05/07/21  Nutritional Goals (per RD rec on 8/2): kCal: 1750-2030 , Protein: 87-116 , Fluid: >=1.8L  Current Nutrition:  TPN Dysphagia 3 diet 8/3 -very low intake (nibbles,  supplements *No feeding tube recommended due to esophageal injury  Plan:  Increase back to goal TPN due to low amount of po intake. Will start cycling TPN to allow for time periods of less liver stimulation.   Infuse 1920 mL over 18 hrs : 56 mL/hr x 1 hr, then 113 mL/hr x 16 hrs, then 56 mL/hr x 1 hr. This will provide 111g AA, 230g CHO, and 58g ILE for total of 1805 kCal, meeting 100% of patient's needs. Electrolytes in TPN: continue low dose Na 32mq/L, K to 25 mEq/L, Ca 053m/L since 7/30, Mg 65m6mL, Phos 50m73mL, maximum acetate Add standard MVI and trace elements to TPN Continue CVTS SSI Q6H and adjust insulin in TPN to 60 units.  Monitor TPN labs on Mon/Thurs   JessSloan LeiterarmD, BCPS, BCCCP Clinical Pharmacist Please refer to [facility] PFort Bendbers 05/17/2021, 7:09 AM

## 2021-05-17 NOTE — Progress Notes (Signed)
Family Medicine Teaching Service Daily Progress Note Intern Pager: [phone]  Patient name: Dana Coleman Medical record number: [id number] Date of birth: [birth year] Age: 80 y.o. Gender: female  Primary Care Provider: Gifford Shave, MD Consultants: General Surgery, CVTS, Nephrology (s/o), PCCM (s/o)  Code Status: FULL  Pt Overview and Major Events to Date:  7/23: Right pigtail catheter placed, Admitted to ICU, intubated 7/24: CRRT, Emergent right thoracotomy, repair of esophageal perforation with intercostal pedicled muscle flap and drainage of empyema and decortication of right lung 7/25: Probable inferior NSTEMI, no acute intervention. Echo with mildly reduced LV, large RV 7/26: Extubated, double-lumen PICC placed, TPN started 7/28: Heparin infusion started for A-fib 8/1: Esophagram without leak, started on PO, heparin gtt stopped due to increased bleeding from chest tubes. Declotted by CVTS. Transfused 1U PRBC.   8/3: Anterior chest tube removed 8/4: Remaining chest tube removed   Antibiotic Timeline: Zosyn: 7/23-7/26 Eraxis: 7/23- Unasyn: 7/26-7/31 Meropenem: 7/31-8/3 Vancomycin: 8/1-8/3 Ceftriaxone: 8/3- Metronidazole: 8/3-  Microbiology Timeline: 7/23: MRSA PCR negative 7/23: Pleural fluid grew strep salivarius pan-sensitive, strep mitas (sensative to CTX), rare candida albicans 7/24: Blood cultures negative x2 7/30: Blood cultures negative x2  Assessment and Plan: Dana Coleman is a 80 y/o F who was admitted on 7/23 for AMS and SOB, subsequently found to have acute renal failure, esophageal perforation now POD #13 from repair. Admission thus far has also been complicated by probable inferior NSTEMI, acute blood loss anemia requiring 1U PRBC and atrial-fibrillation. PMHx is significant for T1DM, CAD, HTN, HLD, MI (2004), Hurthle cell adenoma (2004), cataracts, cervical cancer.  S/P Repair Esophageal Rupture  Empyema (resolved)  POD #13, patient reports  right-sided back pain as well as pain when she moves, however she states that she overall is feeling improved. Improving WBC from 19 to 11.2, afebrile overnight.  Current abx regimen includes metronidazole 500 mg twice daily for 10 days and ceftriaxone daily for 10 days.  Patient is also on Eraxis 100 mg daily for 20-day course.  Blood cultures most recently have been negative for 5 days - continue to appreciate CVTS surgery recommendations and care for pt  -  continue Eraxis, day 12/19 - continue CTX until, day 3-day/10 - continue metronidazole day 3/10  - Tylenol 650 as needed - monitor VS per floor protocol   Hypoxia, improving  Patient extubated on 05/07/21 On two liters via [place] with oxygen saturations in high 90s.  -Continue incentive spirometry -Continue oxygen as needed with goal oxygen saturation greater than 90% -Continue to monitor oxygen saturation  Severe Protein Malnutrition Patient has not been able to tolerate PO feeds as of yet, continues to receive TPN.  Patient and RN report loose stools whenever patient drinks or eats anything by mouth.  They deny any hematochezia or melena.  Given patient's recent antibiotic course, will consider for C. difficile testing. -Stool pathogen testing ordered -RD following, appreciate care and recommendations  -Continue feeding supplement twice daily between meals  Anemia  Transfusion threshold less than 8.0.  Hemoglobin today at 7.7 -Plan to transfuse 1 unit packed red blood cell  NSTEMI  -Cardiac monitoring  A flutter   -Continue patient on metoprolol 2.5 IV q. every 6 hours  T2DM Patient received 22 units of fasting acting insulin in last 24 hours.  -Continue sliding scale insulin -Insulin added to patient's TPN   Deconditioning PT/OT following patient.  -Recommendation for CIR pending clinical improvement  FEN/GI: dysphagia 3 diet   PPx: Lovenox, Protonix  Dispo: continues  to require inpatient admission  pending clinical  improvement . Barriers include requiring majority of nutrition via TPN, deconditioning following ICU course.   Subjective:  Patient reports pain on the right side of her back especially with moving.  Overall feels improved.  Patient expresses gratitude for her care thus far.  Objective: Temp:  [97.9 F (36.6 C)-100.1 F (37.8 C)] 97.9 F (36.6 C) (08/06 1153) Pulse Rate:  [84-100] 100 (08/06 1500) Resp:  [17-23] 20 (08/06 1500) BP: (103-146)/(56-71) 146/71 (08/06 1500) SpO2:  [95 %-100 %] 99 % (08/06 1500)  Physical Exam: General: Elderly female sitting up in bedside chair drinking coffee in no acute distress Cardiovascular: Regular rate and rhythm Respiratory: Shallow breaths, no signs of respiratory distress, stable on 2 L via nasal cannula Abdomen: Soft, bowel sounds present Extremities: Bilateral lower extremity 1+ pitting edema  Laboratory: Recent Labs  Lab 05/15/21 0359 05/16/21 0830 05/18/21 0405  WBC 22.4* 19.3* 11.2*  HGB 9.0* 8.5* 7.7*  HCT 26.7* 26.8* 23.7*  PLT 241 273 278   Recent Labs  Lab 05/16/21 0729 05/17/21 0500 05/18/21 0619  NA 139 136 135  K 5.4* 3.7 3.4*  CL 116* 106 106  CO2 15* 23 22  BUN 33* 24* 26*  CREATININE 0.79 0.74 0.74  CALCIUM 8.8* 8.4* 8.1*  PROT 5.3* 5.2* 5.0*  BILITOT 0.8 0.3 0.4  ALKPHOS 115 99 83  ALT 271* 180* 106*  AST 352* 103* 40  GLUCOSE 112* 128* 157*    Imaging/Diagnostic Tests: No new imaging    Eulis Foster, MD 05/18/2021, 4:23 PM PGY-3, [place] Intern pager: [phone], text pages welcome

## 2021-05-17 NOTE — Progress Notes (Signed)
Physical Therapy Treatment Patient Details Name: Dana Coleman MRN: [id number] DOB: [birth year] Today's Date: 05/17/2021    History of Present Illness Pt is a 80 yo female admitted on 05/04/21 for AMS. HX of cholecystectomy on 7/21.  Intubated upon admission, 7/24 Started CRRT, Esophageal perforation with septic shock post flap repair, drainage of empyema and decortication of R lung with chest tube placed.  7/25 EKG changes and probable nstemi. Extubated 7/26. PMH includes: R breast CA, DM, CAD, HTN.    PT Comments    Pt pleasant and reports no pain at rest but fearful of pain with movement. Pt benefits from encouragement and reassurance and continues to show progression with decreased assist for transfers and slowly improving gait distance. Pt on 4L throughout with SpO2 >95% and HR 116. Pt educated for HEP and importance of progressive mobility. Will continue to follow.     Follow Up Recommendations  CIR     Equipment Recommendations  None recommended by PT    Recommendations for Other Services       Precautions / Restrictions Precautions Precautions: Fall    Mobility  Bed Mobility Overal bed mobility: Needs Assistance Bed Mobility: Rolling;Sidelying to Sit Rolling: Mod assist Sidelying to sit: Mod assist;HOB elevated       General bed mobility comments: HOB 35 degrees with use of pad, assist to bend knee and rotate pelvis to roll with assist to clear legs and lift trunk from surface. Mod assist with pad to scoot to EOB. Max +2 to scoot back in chair    Transfers Overall transfer level: Needs assistance     Sit to Stand: Min assist         General transfer comment: cues for hand placement with min assist to stand from bed and chair, increased time to stabilize as posterior lean in standing  Ambulation/Gait Ambulation/Gait assistance: Min assist;+2 physical assistance Gait Distance (Feet): 10 Feet Assistive device: Rolling walker (2 wheeled) Gait  Pattern/deviations: Wide base of support;Trunk flexed;Step-through pattern;Decreased stride length   Gait velocity interpretation: <1.8 ft/sec, indicate of risk for recurrent falls General Gait Details: pt with wide stance, flexed trunk and tendency to push RW too anterior. Assist for balance and to control RW with close chair follow. Pt walked 8' then 10' after seated rest between trials   Stairs             Wheelchair Mobility    Modified Rankin (Stroke Patients Only)       Balance Overall balance assessment: Needs assistance Sitting-balance support: No upper extremity supported;Feet supported Sitting balance-Leahy Scale: Fair     Standing balance support: Bilateral upper extremity supported;During functional activity Standing balance-Leahy Scale: Poor Standing balance comment: relies on BUE and external support                            Cognition Arousal/Alertness: Awake/alert Behavior During Therapy: Flat affect Overall Cognitive Status: Impaired/Different from baseline                         Following Commands: Follows one step commands consistently;Follows one step commands with increased time     Problem Solving: Slow processing General Comments: pt with flat affect and responding appropriately but slowly, needs encouragement      Exercises General Exercises - Lower Extremity Long Arc Quad: AROM;Both;10 reps;Seated Straight Leg Raises: AROM;10 reps;Both;Seated    General Comments  Pertinent Vitals/Pain Faces Pain Scale: Hurts little more Pain Location: R flank Pain Descriptors / Indicators: Discomfort;Grimacing;Moaning Pain Intervention(s): Limited activity within patient's tolerance;Patient requesting pain meds-RN notified;Premedicated before session;Repositioned    Home Living                      Prior Function            PT Goals (current goals can now be found in the care plan section) Acute Rehab PT  Goals Time For Goal Achievement: 05/31/21 Potential to Achieve Goals: Fair Progress towards PT goals: Goals downgraded-see care plan    Frequency    Min 3X/week      PT Plan Current plan remains appropriate    Co-evaluation              AM-PAC PT "6 Clicks" Mobility   Outcome Measure  Help needed turning from your back to your side while in a flat bed without using bedrails?: A Lot Help needed moving from lying on your back to sitting on the side of a flat bed without using bedrails?: A Lot Help needed moving to and from a bed to a chair (including a wheelchair)?: A Lot Help needed standing up from a chair using your arms (e.g., wheelchair or bedside chair)?: A Little Help needed to walk in hospital room?: Total Help needed climbing 3-5 steps with a railing? : Total 6 Click Score: 11    End of Session Equipment Utilized During Treatment: Gait belt;Oxygen Activity Tolerance: Patient tolerated treatment well;Patient limited by fatigue Patient left: in chair;with call bell/phone within reach;with chair alarm set Nurse Communication: Mobility status PT Visit Diagnosis: Other abnormalities of gait and mobility (R26.89);Muscle weakness (generalized) (M62.81);Difficulty in walking, not elsewhere classified (R26.2);Pain     Time: [id number] PT Time Calculation (min) (ACUTE ONLY): 40 min  Charges:  $Gait Training: 8-22 mins $Therapeutic Exercise: 8-22 mins $Therapeutic Activity: 8-22 mins                     Dana Coleman, PT Acute Rehabilitation Services Pager: [phone] Office: [place] 05/17/2021, 9:33 AM

## 2021-05-17 NOTE — Progress Notes (Signed)
Nutrition Follow-up:  Patient sitting in recliner asleep upon RD visit.   Pt remains very weak. No recorded intake since 8/3, but patient states she has not been able to eat much from her meal trays. Reports she was able to take bites of sweet potatoes and finished one Ensure yesterday.  RD observed meal tray at bedside untouched with Ensure 50% completed. RD offered to help patient eat lunch, patient does not wish to try at this time. Family has not brought in dentures.   UE weakness continues to be a barrier to self feeding. RN has offered multiple times to help with feeding today, but patient has declined.   RD encouraged meal and supplement intake to help facilitate weaning from TPN.   TPN increased to meet 100% of needs. Transitioned to cyclic infusion of 123456 ml over 18 hours to provide 111 g protein and 1805 kcal.   Interventions: Recommend continuing TPN to meet 100% of nutrition needs until pt demonstrating adequate po intake (>50-60% of meals).   Continue Ensure Enlive po BID, each supplement provides 350 kcal and 20 grams of protein  Continue Magic cup BID with meals, each supplement provides 290 kcal and 9 grams of protein  Feeding assistance at meals  Per MD, Pt is not candidate for Cortrak tube   Lakeisha Waldrop MS, RD, LDN, CNSC Clinical Nutrition Pager listed in [place]

## 2021-05-17 NOTE — Progress Notes (Signed)
Inpatient Rehab Admissions Coordinator:   I await insurance authorization and do not have a bed for this pt. Today.   Clemens Catholic, [place], [place] Admissions Coordinator  [phone] ([place]) [phone] (office)

## 2021-05-17 NOTE — Progress Notes (Signed)
Occupational Therapy Treatment Patient Details Name: Dana Coleman MRN: [id number] DOB: 29-Sep-1941 Today's Date: 05/17/2021    History of present illness Pt is a 80 yo female admitted on 05/04/21 for AMS. HX of cholecystectomy on 7/21.  Intubated upon admission, 7/24 Started CRRT, Esophageal perforation with septic shock post flap repair, drainage of empyema and decortication of R lung with chest tube placed.  7/25 EKG changes and probable nstemi. Extubated 7/26. PMH includes: R breast CA, DM, CAD, HTN.   OT comments  Patient seated in recliner and agreeable to OT with encouragement due to fatigue.  Poor recall of hand placement during transfers, but progressing to min assist for stand pivot transfers to [facility] using RW.  Min assist for toileting in standing.  Will follow acutely.    Follow Up Recommendations  CIR;Supervision/Assistance - 24 hour    Equipment Recommendations  3 in 1 bedside commode    Recommendations for Other Services Rehab consult    Precautions / Restrictions Precautions Precautions: Fall Restrictions Weight Bearing Restrictions: No       Mobility Bed Mobility Overal bed mobility: Needs Assistance Bed Mobility: Rolling;Sidelying to Sit Rolling: Mod assist Sidelying to sit: Mod assist;HOB elevated       General bed mobility comments: OOB in recliner upon entry    Transfers Overall transfer level: Needs assistance Equipment used: Rolling walker (2 wheeled) Transfers: Sit to/from [facility] Sit to Stand: Min assist Stand pivot transfers: Min assist       General transfer comment: cueing for hand placement and technique, increased time required; min assist to power up    Balance Overall balance assessment: Needs assistance Sitting-balance support: No upper extremity supported;Feet supported Sitting balance-Leahy Scale: Fair     Standing balance support: Bilateral upper extremity supported;Single extremity supported;During  functional activity Standing balance-Leahy Scale: Poor Standing balance comment: able to engage in ADLs with 1 UE support and min assist; relies on BUE support during transfers                           ADL either performed or assessed with clinical judgement   ADL Overall ADL's : Needs assistance/impaired     Grooming: Wash/dry face;Set up;Sitting                   Toilet Transfer: Minimal assistance;Stand-pivot;BSC;RW   Toileting- Clothing Manipulation and Hygiene: Minimal assistance;Sit to/from stand Toileting - Clothing Manipulation Details (indicate cue type and reason): standing hygiene after using BSC, min assist for balance     Functional mobility during ADLs: Minimal assistance;Rolling walker;Cueing for safety General ADL Comments: pt limited by weakness, decreased activity tolerance, impaired balance     Vision       Perception     Praxis      Cognition Arousal/Alertness: Awake/alert Behavior During Therapy: Flat affect Overall Cognitive Status: Impaired/Different from baseline Area of Impairment: Problem solving;Following commands;Awareness                       Following Commands: Follows one step commands consistently;Follows one step commands with increased time   Awareness: Emergent Problem Solving: Slow processing;Requires verbal cues General Comments: pt with slow processing, requires max encouragement.        Exercises General Exercises - Lower Extremity Long Arc Quad: AROM;Both;10 reps;Seated Straight Leg Raises: AROM;10 reps;Both;Seated   Shoulder Instructions       General Comments VSS on 4L [place]  Pertinent Vitals/ Pain       Pain Assessment: Faces Faces Pain Scale: Hurts little more Pain Location: R flank Pain Descriptors / Indicators: Discomfort;Grimacing;Moaning Pain Intervention(s): Limited activity within patient's tolerance;Monitored during session;Repositioned  Home Living                                           Prior Functioning/Environment              Frequency  Min 2X/week        Progress Toward Goals  OT Goals(current goals can now be found in the care plan section)  Progress towards OT goals: Progressing toward goals  Acute Rehab OT Goals Patient Stated Goal: get back to housework OT Goal Formulation: With patient  Plan Discharge plan remains appropriate;Frequency remains appropriate    Co-evaluation                 AM-PAC OT "6 Clicks" Daily Activity     Outcome Measure   Help from another person eating meals?: A Little Help from another person taking care of personal grooming?: A Little Help from another person toileting, which includes using toliet, bedpan, or urinal?: A Little Help from another person bathing (including washing, rinsing, drying)?: A Lot Help from another person to put on and taking off regular upper body clothing?: A Little Help from another person to put on and taking off regular lower body clothing?: A Lot 6 Click Score: 16    End of Session Equipment Utilized During Treatment: Oxygen;Rolling walker  OT Visit Diagnosis: Other abnormalities of gait and mobility (R26.89);Muscle weakness (generalized) (M62.81);Pain;Other symptoms and signs involving cognitive function Pain - Right/Left: Right Pain - part of body:  (flank)   Activity Tolerance Patient limited by fatigue   Patient Left in chair;with call bell/phone within reach;with chair alarm set   Nurse Communication Mobility status;Other (comment) (encouraged BSC vs purewick)        Time: [id number] OT Time Calculation (min): 24 min  Charges: OT General Charges $OT Visit: 1 Visit OT Treatments $Self Care/Home Management : 23-37 mins  [name], OT Acute Rehabilitation Services Pager [phone] Office [phone]    [name] 05/17/2021, 12:28 PM

## 2021-05-17 NOTE — Progress Notes (Addendum)
NAME:  KSENIA COBAS, MRN:  [id number], DOB:  [birth year], LOS: 79 ADMISSION DATE:  05/04/2021, CONSULTATION DATE:  7/23 REFERRING MD:  Ralene Bathe, CHIEF COMPLAINT:  Dyspnea   History of Present Illness:  80 y/o female admitted for empyema after a lap chole, noted to have esophageal perforation.  Pertinent  Medical History  DM2 CAD Hypertension Hyperlipidemia Prior R breast cancer  Significant Hospital Events: Including procedures, antibiotic start and stop dates in addition to other pertinent events   7/23 admitted intubated. Pigtail by CCS in ED -- interestingly seems to have bilious outpt 7/24 Starting CRRT. Abundant GPCs in pleural fluid 7/24 1.  Right thoracotomy 2.  Repair of esophageal perforation with intercostal pedicled muscle flap. 3.  Drainage of empyema and decortication of right lung 7/25 early AM EKG changes, trop leak, probable inferiror NSTEMI, not candidate for intervention.  Echo with mildly reduced LV, large RV ?cause 7/26 extubated 7/29 up out of bed, rising WBC 7/30 blood culture >  7/31 WBC up again, cough with mucus production, chest pain 8/1 elevated WBC, added Vanco, MRSA swab pending, CT stripped from clot by [facility]  8/2 pain control, OOB, passed swallow, started clears  8/3 mobility   Interim History / Subjective:  No complaints. CT removed this am    Objective   Blood pressure 126/70, pulse 89, temperature 98.3 F (36.8 C), temperature source Oral, resp. rate 16, height '5\' 1"'$  (1.549 m), weight 66 kg, SpO2 100 %.        Intake/Output Summary (Last 24 hours) at 05/17/2021 [id number] Last data filed at 05/17/2021 0800 Gross per 24 hour  Intake 1038.11 ml  Output 1102 ml  Net -63.89 ml   Filed Weights   05/10/21 0500 05/14/21 0444 05/15/21 0400  Weight: 60.5 kg 65.6 kg 66 kg    Examination: General: 80 year old female resting in bed profoundly weak HEENT normocephalic atraumatic no jugular venous distention Pulmonary: Diminished bases Cardiac:  Regular rate and rhythm Abdomen: Soft nontender Extremities: Dependent edema Neuro: Awake oriented no focal deficits GU: Voids  Resolved Hospital Problem list   Bleeding from chest tube, small hemothorax, clot and chest tube (improved)  AKI   Assessment & Plan:   Empyema, polymicrobial (strep and candida albicans) due to esophageal perforation status post repair Wbc slowly trending down  Plan: Intermittent chest x-ray Pain management Continue to trend fever and white blood cell curve Currently on ceftriaxone, Flagyl, and Eraxis; currently on day 13 of antimicrobial coverage. Final CT just removed 8/4  Will ck cbc and PCT. Still trying to determine stop date for antimicrobials.   Hypoxia in setting of atelectasis and poor cough mechanics Plan Continue mobility Pulse oximetry Spirometry O2 as needed  Physical deconditioning Plan PT OT  Severe protein calorie malnutrition Plan Getting TPN in addition to oral intake  NSTEMI with WMA and dilated RV Plan Telemetry  Atrial flutter Heparin stopped on 8/1 due to chest tube bleeding  Plan Telemetry  Intermittent fluid and electrolyte imbalance: Plan Continue to follow daily chemistries Adjust TPN as indicated  Hyperglycemia with DM2; one isolated elevation this morning Plan No change in current sliding scale and Lantus  Best Practice (right click and "Reselect all SmartList Selections" daily)   Diet/type: clear liquids and TPN DVT prophylaxis: systemic heparin GI prophylaxis: N/A Lines: Central line and yes and it is still needed Foley:  N/A Code Status:  full code Last date of multidisciplinary goals of care discussion [n/a]  Out of ICU   Collier Salina  Starleen Arms ACNP-BC [place] Pager # [phone] OR # [phone] if no answer   PCCM:  80 yo, repair of perforated esophagus, poly microbial empyema. Overall doing well. Chest tubes removed.  BP 129/74 (BP Location: Right Arm)   Pulse 93    Temp 100.1 F (37.8 C) (Oral)   Resp 18   Ht '5\' 1"'$  (1.549 m)   Wt 66 kg   SpO2 97%   BMI 27.49 kg/m   Gen: elderly fm, comfortable up in chair  Heart: RRR, s1 s2  Lungs: diminished in bases, no wheeze  Abd: soft nt nd   Labs reviewed   A:  Polymicrobial empyema  Esophageal perforation s/p repair  Severe protain cal malnutrition  Physical weakness   P: Complete 7 days abx following chest tube removal  Mobility and PT/OT  Remains on TPN  Discussed with pharmacy  Stable for pick by [place], DO [facility] 05/17/2021 2:44 PM

## 2021-05-17 NOTE — Hospital Course (Addendum)
Dana Coleman is a 80 y.o. F who presented to [facility] on 7/23 for AMS and SOB, two days after lap chole (7/21). PMHx is significant for T1DM, CAD, HTN, HLD, MI (2004), Hurthle cell adenoma (2004), cataracts, cervical cancer. Below is her brief hospital course listed by problem. For additional information, refer to her H&P.   Altered Mental Status  Acute Respiratory Distress  Esophageal Perforation  Complex pleural effusion consistent with empyema In ED patient hypotensive (80s/60s), bradycardic (40's) lethargic and hypoxic (82% on NRB at 15L). Treated with calcium, bicarb for possible renal failure/acidosis and intubated with RSI. IVF and levophed started for hypotension. CXR with right loculated pleural effusion. Seen by surgery and right pigtail catheter was placed. Admitted to ICU from 7/23-8/5. She was found to have a hydropneumothorax and started on Zosyn. Chest tube placed. Pleural fluid labs with elevated LD and amylase, concerning for esophageal fluid. CT scan of chest, abd/pelv with air bubbles in loculated pleural fluid. Esophagram performed through NG tube. Found to have a mid esophageal perforation with leak into the mediastinum and right chest. She underwent emergent right thoracotomy, repair of esophageal perforation with intercostal pedicled muscle flap and drainage of empyema and decortication of right lung on 7/24. She was started on Eraxis on 7/23 as strep and candida grew from pleural fluid cultures. Extubated 7/26. Esophagram on 8/1 with no leak. There were signs of esophageal motility disorder. TPN was started but patient was gradually able to tolerate PO. Finished course of Eraxis, ceftriaxone and metronidazole after 3 weeks. She completed the course without further problems.  A. Fib with RVR  PE with right heart strain  hypoxia Patient developed hypoxia, tachypnea on 05/20/2021.  D-dimer elevated to 19.5, lactic acid 2.4.  Chest x-ray showed bilateral pleural  effusions.  ABG showed hypoxia, and she required increasing oxygen needs.  Cardiology consulted and had concern for PE, CTA ordered, noted small burden PE.  Went into A. fib with RVR requiring control with metoprolol to sinus rhythm.  Received heparin drip and transition to Eliquis on 8/12.  Significant oxygen requirements up to HFNC 35 L and 40%.  Progressively weaned to room air on 06/06/21 and has been stable on eliquis since this time.  HFrEF  Weight Gain  Most recent echo on 05/21/21 shows ejection fraction of 25 to 30% with mild left ventricular hypertrophy and severely decreased function.  Previous echo 05/06/21 showed an EF closer to 40% so this is a new finding.  She has gone up 40 pounds since admission, stable. No evidence of volume overload on physical exam. S/p Lasix '20mg'$  x1. No need for more Lasix at this time.   Renal Failure  Metabolic Acidosis- resolved Lactic acid >11. Given bicarb with improvement in HR to 50-60. Creatinine 4 (baseline <1). Given severe lactic acidosis, started on CRRT. Creatinine ultimately improved over the course of patients admission. Most updated Creatinine: 0.78 with GFR: >60.   Thrombocytopenia  Coagulopathy- resolved Platelets as low as 66 on 7/27. DIC panel significant for elevated PT, INR, fibrinogen, and d-dimer. 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 Platelets trended back up and WNL by time of discharge.  Atrial-Fibrillation  Bradycardia  NSTEMI On 7/25, HR as low as 25 bpm with MAP's in 50's. Dopamine started with stabilization of MAPs above 65. EKG with slight elevation in inferior leads and Troponins obtained. Troponin elevated at 12,201.  Also found to be in atrial fibrillation with low-HR. No intervention for NSTEMI given she  was not a candidate for cath, intervention or anticoagulation given her recent surgery (7/24). Echo with EF 45%, regional wall motion abnormalities with basal to mid-inferolateral and basal inferior severe  hypokinesis, mild LVH, G1DD, severely reduced RV systolic function. Started on heparin infusion on 7/28.  Transitioned to Eliquis on 8/12.    Acute Blood Loss Anemia While on heparin gtt there was increased bleeding noted around chest tube. Heparin gtt stopped on 8/1. Hgb 7.6. Received 1U PRBC. Hemoglobin remained stable and no additional transfusions were needed.  Severe Malnutrition Prealbumin 6.1. She was started on parenteral nutrition on 7/26. After esophagram performed and demonstrated no leak, she was gradually started back on PO. TPN was stopped on 06/05/21. Pt was given a G-Tube by IR to help with malnourishment. Patient attempted nightly bolus feeds but struggled with vomiting, which improved when switched to continuous feeds. Patient is stable on continuous feeds.  Sacral Deep Tissue Pressure Injury Developed during stay. Wound care consulted. Wound care throughout admission. Patient will need regular position changing, and silicone foam dressing to the area every 3 days.    Incidental pulmonary nodule A 6 mm noncalcified nodule in the right upper lobe was found incidentally on chest CT from 03/29/21. Radiology recommends a non-contrast chest CT at 6-12 months. If the nodule is stable at time of repeat CT, then future CT at 18-24 months (from 03/29/21 scan) is considered optional for low-risk patients, but is recommended for high-risk patients.  PCP follow-up recommendations CT chest repeat for lung nodules in December 2022 Restart Entresto, carvedilol, aldactone, and SGLT2 inhibitor at outpatient follow up. These were held due to renal function, hypokalemia, and hypotension. If improved at follow up, please consider restarting for HFrEF. Will need follow up with Gastroenterology, Cardiology, and Nutrition  Follow up AFP culture from pleural effusion  Schedule PCP follow up on discharge from SNF

## 2021-05-17 NOTE — Progress Notes (Signed)
12 Days Post-Op Procedure(s) (LRB): THORACOTOMY MAJOR REPAIR PERFORATED ESOPHAGUS (Right) Subjective: No specific complaints.  Reportedly having diarrhea.  Objective: Vital signs in last 24 hours: Temp:  [98.3 F (36.8 C)-99 F (37.2 C)] 98.3 F (36.8 C) (08/05 0744) Pulse Rate:  [87-108] 93 (08/05 0923) Cardiac Rhythm: Sinus tachycardia (08/04 2000) BP: (112-147)/(70-84) 126/70 (08/05 0600) SpO2:  [95 %-100 %] 95 % (08/05 0923)  Hemodynamic parameters for last 24 hours:    Intake/Output from previous day: 08/04 0701 - 08/05 0700 In: 1072.5 [I.V.:642.4; IV Piggyback:430] Out: 952 [Urine:951; Stool:1] Intake/Output this shift: Total I/O In: 40 [I.V.:40] Out: 150 [Urine:150]  General appearance: fatigued Heart: regular rate and rhythm Lungs: diminished breath sounds bibasilar Abdomen: soft, non-tender; bowel sounds normal Wound: dressing dry  Lab Results: Recent Labs    05/15/21 0359 05/16/21 0830  WBC 22.4* 19.3*  HGB 9.0* 8.5*  HCT 26.7* 26.8*  PLT 241 273   BMET:  Recent Labs    05/16/21 0729 05/17/21 0500  NA 139 136  K 5.4* 3.7  CL 116* 106  CO2 15* 23  GLUCOSE 112* 128*  BUN 33* 24*  CREATININE 0.79 0.74  CALCIUM 8.8* 8.4*    PT/INR: No results for input(s): LABPROT, INR in the last 72 hours. ABG    Component Value Date/Time   PHART 7.415 05/06/2021 0113   HCO3 27.8 05/07/2021 0516   TCO2 29 05/07/2021 0516   ACIDBASEDEF 14.0 (H) 05/04/2021 2049   O2SAT 96.0 05/07/2021 0516   CBG (last 3)  Recent Labs    05/16/21 1154 05/16/21 2343 05/17/21 0628  GLUCAP 123* 168* 119*   CXR: stable bibasilar atelectasis. Postop changes on the right.  Assessment/Plan: S/P Procedure(s) (LRB): THORACOTOMY MAJOR REPAIR PERFORATED ESOPHAGUS (Right)  POD 12 Hemodynamically stable in sinus rhythm. CXR stable. Continue to encourage IS, ambulation with PT. She is not taking enough po to provide adequate nutrition so continuing TNA.  WBC decreasing and  afebrile on IV antibiotic.  Waiting on 4E bed.   LOS: 13 days    Gaye Pollack 05/17/2021

## 2021-05-18 DIAGNOSIS — J939 Pneumothorax, unspecified: Secondary | ICD-10-CM | POA: Diagnosis not present

## 2021-05-18 DIAGNOSIS — N179 Acute kidney failure, unspecified: Secondary | ICD-10-CM | POA: Diagnosis not present

## 2021-05-18 DIAGNOSIS — K223 Perforation of esophagus: Secondary | ICD-10-CM | POA: Diagnosis not present

## 2021-05-18 DIAGNOSIS — J948 Other specified pleural conditions: Secondary | ICD-10-CM | POA: Diagnosis present

## 2021-05-18 LAB — CBC
HCT: 23.7 % — ABNORMAL LOW (ref 36.0–46.0)
HCT: 26 % — ABNORMAL LOW (ref 36.0–46.0)
Hemoglobin: 7.7 g/dL — ABNORMAL LOW (ref 12.0–15.0)
Hemoglobin: 8.7 g/dL — ABNORMAL LOW (ref 12.0–15.0)
MCH: 34.7 pg — ABNORMAL HIGH (ref 26.0–34.0)
MCH: 29.8 pg (ref 26.0–34.0)
MCHC: 32.5 g/dL (ref 30.0–36.0)
MCHC: 33.5 g/dL (ref 30.0–36.0)
MCV: 106.8 fL — ABNORMAL HIGH (ref 80.0–100.0)
MCV: 89 fL (ref 80.0–100.0)
Platelets: 278 10*3/uL (ref 150–400)
Platelets: 327 10*3/uL (ref 150–400)
RBC: 2.22 MIL/uL — ABNORMAL LOW (ref 3.87–5.11)
RBC: 2.92 MIL/uL — ABNORMAL LOW (ref 3.87–5.11)
RDW: 19.2 % — ABNORMAL HIGH (ref 11.5–15.5)
RDW: 16.8 % — ABNORMAL HIGH (ref 11.5–15.5)
WBC: 11.2 10*3/uL — ABNORMAL HIGH (ref 4.0–10.5)
WBC: 14.6 10*3/uL — ABNORMAL HIGH (ref 4.0–10.5)
nRBC: 0.2 % (ref 0.0–0.2)
nRBC: 0 % (ref 0.0–0.2)

## 2021-05-18 LAB — COMPREHENSIVE METABOLIC PANEL
ALT: 106 U/L — ABNORMAL HIGH (ref 0–44)
AST: 40 U/L (ref 15–41)
Albumin: 1.2 g/dL — ABNORMAL LOW (ref 3.5–5.0)
Alkaline Phosphatase: 83 U/L (ref 38–126)
Anion gap: 7 (ref 5–15)
BUN: 26 mg/dL — ABNORMAL HIGH (ref 8–23)
CO2: 22 mmol/L (ref 22–32)
Calcium: 8.1 mg/dL — ABNORMAL LOW (ref 8.9–10.3)
Chloride: 106 mmol/L (ref 98–111)
Creatinine, Ser: 0.74 mg/dL (ref 0.44–1.00)
GFR, Estimated: >60 mL/min (ref >60)
Glucose, Bld: 157 mg/dL — ABNORMAL HIGH (ref 70–99)
Potassium: 3.4 mmol/L — ABNORMAL LOW (ref 3.5–5.1)
Sodium: 135 mmol/L (ref 135–145)
Total Bilirubin: 0.4 mg/dL (ref 0.3–1.2)
Total Protein: 5 g/dL — ABNORMAL LOW (ref 6.5–8.1)

## 2021-05-18 LAB — GLUCOSE, CAPILLARY
Glucose-Capillary: 169 mg/dL — ABNORMAL HIGH (ref 70–99)
Glucose-Capillary: 172 mg/dL — ABNORMAL HIGH (ref 70–99)
Glucose-Capillary: 149 mg/dL — ABNORMAL HIGH (ref 70–99)
Glucose-Capillary: 180 mg/dL — ABNORMAL HIGH (ref 70–99)
Glucose-Capillary: 199 mg/dL — ABNORMAL HIGH (ref 70–99)
Glucose-Capillary: 203 mg/dL — ABNORMAL HIGH (ref 70–99)

## 2021-05-18 LAB — PREPARE RBC (CROSSMATCH)

## 2021-05-18 MED ORDER — SODIUM CHLORIDE 0.9% IV SOLUTION: Freq: Once | INTRAVENOUS | Status: DC

## 2021-05-18 MED ORDER — TRAVASOL 10 % IV SOLN
INTRAVENOUS | Status: AC
  Filled 2021-05-18: qty 1113.6

## 2021-05-18 MED ORDER — POTASSIUM CHLORIDE 10 MEQ/50ML IV SOLN
10.0000 meq | INTRAVENOUS | Status: AC
Start: 2021-05-18 — End: 2021-05-18
  Administered 2021-05-18 (×4): 10 meq via INTRAVENOUS
  Filled 2021-05-18 (×4): qty 50

## 2021-05-18 NOTE — Progress Notes (Signed)
PHARMACY - TOTAL PARENTERAL NUTRITION CONSULT NOTE  Indication: s/p esophageal perforation and repair  Patient Measurements: Height: '5\' 1"'$  (154.9 cm) Weight: 66 kg (145 lb 8.1 oz) IBW/kg (Calculated) : 47.8   Body mass index is 27.49 kg/m.  Assessment:  80 y/o female with recent lap chole for symptomatic cholelithiasis on 7/21. PMH including HTN, HLD, DM, R breast cancer, and CAD. Patient presented on 7/23 with AMS and unresponsiveness - was complaining of abdominal discomfort and then patient became unresponsive and slumped on table.  S/p gall bladder surgery and esophageal perforation-repaired 7/24.  Pharmacy consulted for TPN management.  Glucose / Insulin: DM with A1c 7.4% on Lantus 24/d PTA. CBGs last 24h 119-207 with 60 units in TPN.  Utilized 20 units SSI in last 16 hrs since TPN start + 60 units insulin in TPN  Electrolytes: Na down to low normal, Cl and CO2 now WNL, K down to 3.4 after resuming TPN,  CoCa 10.3 (no Ca in TPN), others WNL  Renal: septic ATN s/p CRRT (end 7/25) - SCr < 1 and stable, BUN up to 26 (s/p Lasix 8/4) Hepatic: LFTs significantly increased 8/4 (decreased with holding TPN for 6 hours and Lasix), tbili in normal limits, albumin down 1.2, prealbumin up 8.8, TG down to 113 Intake / Output; MIVF:  UOP 0.55L, chest tube removed, net +6.9 L, +BM -  diarrhea with po intake GI Imaging: - 7/23 CT: no acute abnormalities  GI Surgeries / Procedures:  - 7/21: s/p laparoscopic cholecystectomy and LOA - 7/24: s/p R thoracotomy, repair of esophageal perf with muscle flap, drainage of empyema and decortication of R lung  Central access: PICC 7/26 (double lumen) TPN start date: 05/07/21  Nutritional Goals (per RD rec on 8/2): kCal: 1750-2030 , Protein: 87-116 , Fluid: >=1.8L  Current Nutrition:  TPN Dysphagia 3 diet 8/3 - very low intake, declining help with feeding *No feeding tube recommended due to esophageal injury  Plan:  Increased back to goal TPN 8/5 due to  low amount of po intake. Started cycling TPN to allow for time periods of less liver stimulation.   Infuse 1920 mL over 16 hrs : 64 mL/hr x 1 hr, then 128 mL/hr x 14 hrs, then 64 mL/hr x 1 hr. This will provide 111g AA, 230g CHO, and 58g ILE for total of 1805 kCal, meeting 100% of patient's needs. Electrolytes in TPN: continue low dose Na 61mq/L, K to 25 mEq/L, Ca 052m/L since 7/30, Mg 27m33mL, Phos 89m44mL, maximum acetate Add standard MVI and trace elements to TPN Continue CVTS SSI Q4H and adjust insulin in TPN to 65 units.  Monitor TPN labs on Mon/Thurs  KCl 10 meq IV q1h x4  Thank you for involving pharmacy in this patient's care.  JennRenold GentaarmD, BCPS Clinical Pharmacist Clinical phone for 05/18/2021 until 3p is [phone]/2022 7:07 AM  **Pharmacist phone directory can be found on [email or web address] listed under [facility]

## 2021-05-18 NOTE — Progress Notes (Signed)
13 Days Post-Op Procedure(s) (LRB): THORACOTOMY MAJOR REPAIR PERFORATED ESOPHAGUS (Right) Subjective: No specific complaints.  Reportedly having diarrhea.  Objective: Vital signs in last 24 hours: Temp:  [98.6 F (37 C)-100.1 F (37.8 C)] 98.6 F (37 C) (08/06 0756) Pulse Rate:  [85-96] 93 (08/06 0700) Cardiac Rhythm: Normal sinus rhythm (08/05 2000) Resp:  [17-23] 23 (08/06 0700) BP: (103-144)/(56-76) 121/63 (08/06 0600) SpO2:  [95 %-99 %] 99 % (08/06 0700)  Hemodynamic parameters for last 24 hours:    Intake/Output from previous day: 08/05 0701 - 08/06 0700 In: 1214.4 [P.O.:370; I.V.:414.4; IV Piggyback:430] Out: 550 [Urine:550] Intake/Output this shift: No intake/output data recorded.  General appearance: fatigued Heart: regular rate and rhythm Lungs: diminished breath sounds bibasilar Abdomen: soft, non-tender; bowel sounds normal Wound: dressing dry  Lab Results: Recent Labs    05/16/21 0830 05/18/21 0405  WBC 19.3* 11.2*  HGB 8.5* 7.7*  HCT 26.8* 23.7*  PLT 273 278    BMET:  Recent Labs    05/17/21 0500 05/18/21 0619  NA 136 135  K 3.7 3.4*  CL 106 106  CO2 23 22  GLUCOSE 128* 157*  BUN 24* 26*  CREATININE 0.74 0.74  CALCIUM 8.4* 8.1*     PT/INR: No results for input(s): LABPROT, INR in the last 72 hours. ABG    Component Value Date/Time   PHART 7.415 05/06/2021 0113   HCO3 27.8 05/07/2021 0516   TCO2 29 05/07/2021 0516   ACIDBASEDEF 14.0 (H) 05/04/2021 2049   O2SAT 96.0 05/07/2021 0516   CBG (last 3)  Recent Labs    05/17/21 2332 05/18/21 0323 05/18/21 0743  GLUCAP 174* 169* 172*    CXR: stable bibasilar atelectasis. Postop changes on the right.  Assessment/Plan: S/P Procedure(s) (LRB): THORACOTOMY MAJOR REPAIR PERFORATED ESOPHAGUS (Right)  POD 13 Hemodynamically stable in sinus rhythm. CXR stable. Continue to encourage IS, ambulation with PT. She is not taking enough po to provide adequate nutrition so continuing TNA.   Will add imodium for diarrhea  WBC decreasing and afebrile on IV antibiotic.  Waiting on 4E bed.   LOS: 14 days    Dana Coleman 05/18/2021

## 2021-05-18 NOTE — Progress Notes (Signed)
Inpatient Rehab Admissions Coordinator:   I continue to await insurance authorization for CIR. I updated Pt. I continue to follow for potential admit pending bed availability, insurance auth, and medical readiness.  Clemens Catholic, [place], [place] Admissions Coordinator  [phone] ([place]) [phone] (office)

## 2021-05-18 NOTE — Progress Notes (Addendum)
FPTS Brief Progress Note  S Saw patient at bedside this evening. Patient reporting she is uncomfortable due to chronic back pain and that she would like to be moved in the bed by the RN. She reports she had 1 x BM this evening after eating food. Denies BRBR or melena. She is having mild RIF pain.   O: BP (!) 150/78 (BP Location: Right Arm)   Pulse 100   Temp 98.4 F (36.9 C) (Oral)   Resp 20   Ht '5\' 1"'$  (1.549 m)   Wt 66 kg   SpO2 97%   BMI 27.49 kg/m    General: Alert, no acute distress, frail appearing  Cardio: Normal S1 and S2, RRR, no r/m/g Pulm: CTAB, normal work of breathing Abdomen: Bowel sounds normal. Abdomen soft, generalized tenderness, TTP RIF, no guarding.  Extremities: No peripheral edema.  Neuro: Cranial nerves grossly intact   A/P: Plan per day team   S/p Repair esophageal rupture, empyema  -Soft diet -For pain: tylenol '650mg'$  Q4PRN, oxycodone 5-'10mg'$  Q6PRN -Continue CTX, Flagyl and Eraxis  Acute hypoxic respiratory failure -Monitor respiratory status -Wean oxygen as able  Anemia Post H&H 8.7, above transfusion threshold -Continue to monitor with CBC   - Orders reviewed. Labs for AM ordered, which was adjusted as needed.    Lattie Haw, MD 05/18/2021, 8:35 PM PGY-3, [facility] Night Resident  Please page [phone] with questions.

## 2021-05-19 ENCOUNTER — Inpatient Hospital Stay (HOSPITAL_COMMUNITY): Payer: Medicare Other

## 2021-05-19 DIAGNOSIS — J939 Pneumothorax, unspecified: Secondary | ICD-10-CM | POA: Diagnosis not present

## 2021-05-19 DIAGNOSIS — J9602 Acute respiratory failure with hypercapnia: Secondary | ICD-10-CM

## 2021-05-19 DIAGNOSIS — J9601 Acute respiratory failure with hypoxia: Secondary | ICD-10-CM | POA: Diagnosis present

## 2021-05-19 DIAGNOSIS — K223 Perforation of esophagus: Secondary | ICD-10-CM | POA: Diagnosis not present

## 2021-05-19 LAB — GASTROINTESTINAL PANEL BY PCR, STOOL (REPLACES STOOL CULTURE)

## 2021-05-19 LAB — COMPREHENSIVE METABOLIC PANEL
ALT: 73 U/L — ABNORMAL HIGH (ref 0–44)
AST: 32 U/L (ref 15–41)
Albumin: 1.4 g/dL — ABNORMAL LOW (ref 3.5–5.0)
Alkaline Phosphatase: 86 U/L (ref 38–126)
Anion gap: 9 (ref 5–15)
BUN: 25 mg/dL — ABNORMAL HIGH (ref 8–23)
CO2: 19 mmol/L — ABNORMAL LOW (ref 22–32)
Calcium: 8.5 mg/dL — ABNORMAL LOW (ref 8.9–10.3)
Chloride: 106 mmol/L (ref 98–111)
Creatinine, Ser: 0.7 mg/dL (ref 0.44–1.00)
GFR, Estimated: >60 mL/min (ref >60)
Glucose, Bld: 106 mg/dL — ABNORMAL HIGH (ref 70–99)
Potassium: 4.2 mmol/L (ref 3.5–5.1)
Sodium: 134 mmol/L — ABNORMAL LOW (ref 135–145)
Total Bilirubin: 0.5 mg/dL (ref 0.3–1.2)
Total Protein: 6.1 g/dL — ABNORMAL LOW (ref 6.5–8.1)

## 2021-05-19 LAB — BLOOD GAS, ARTERIAL
Acid-base deficit: 6.4 mmol/L — ABNORMAL HIGH (ref 0.0–2.0)
Bicarbonate: 17.2 mmol/L — ABNORMAL LOW (ref 20.0–28.0)
FIO2: 28
O2 Saturation: 90.2 %
Patient temperature: 37
pCO2 arterial: 26.8 mmHg — ABNORMAL LOW (ref 32.0–48.0)
pH, Arterial: 7.423 (ref 7.350–7.450)
pO2, Arterial: 59.5 mmHg — ABNORMAL LOW (ref 83.0–108.0)

## 2021-05-19 LAB — GLUCOSE, CAPILLARY
Glucose-Capillary: 105 mg/dL — ABNORMAL HIGH (ref 70–99)
Glucose-Capillary: 109 mg/dL — ABNORMAL HIGH (ref 70–99)
Glucose-Capillary: 178 mg/dL — ABNORMAL HIGH (ref 70–99)
Glucose-Capillary: 185 mg/dL — ABNORMAL HIGH (ref 70–99)
Glucose-Capillary: 206 mg/dL — ABNORMAL HIGH (ref 70–99)
Glucose-Capillary: 261 mg/dL — ABNORMAL HIGH (ref 70–99)

## 2021-05-19 LAB — CALCIUM, IONIZED: Calcium, Ionized, Serum: 4.6 mg/dL (ref 4.5–5.6)

## 2021-05-19 LAB — CBC
HCT: 28.5 % — ABNORMAL LOW (ref 36.0–46.0)
Hemoglobin: 9.6 g/dL — ABNORMAL LOW (ref 12.0–15.0)
MCH: 30.2 pg (ref 26.0–34.0)
MCHC: 33.7 g/dL (ref 30.0–36.0)
MCV: 89.6 fL (ref 80.0–100.0)
Platelets: 311 10*3/uL (ref 150–400)
RBC: 3.18 MIL/uL — ABNORMAL LOW (ref 3.87–5.11)
RDW: 16 % — ABNORMAL HIGH (ref 11.5–15.5)
WBC: 11.8 10*3/uL — ABNORMAL HIGH (ref 4.0–10.5)
nRBC: 0 % (ref 0.0–0.2)

## 2021-05-19 MED ORDER — TRAVASOL 10 % IV SOLN
INTRAVENOUS | Status: AC
  Filled 2021-05-19: qty 1113.6

## 2021-05-19 MED ORDER — FUROSEMIDE 10 MG/ML IJ SOLN
20.0000 mg | Freq: Once | INTRAMUSCULAR | Status: AC
  Administered 2021-05-19: 20 mg via INTRAVENOUS
  Filled 2021-05-19: qty 2

## 2021-05-19 MED ORDER — SALINE SPRAY 0.65 % NA SOLN
2.0000 | NASAL | Status: DC | PRN
  Administered 2021-05-19: 2 via NASAL
  Filled 2021-05-19: qty 44

## 2021-05-19 NOTE — Progress Notes (Addendum)
FPTS Interim Progress Note  S:Received page from RN at 20:47 for pt complaining of congestion in nasal passages. O2 sats >95% at that time on 2L [place]. I put in order for nasal saline. I received second page form RN at 21:37 for pt having dyspnea with tachypnea (RR 40s) but maintaining oxygen saturations >90% on 2L [place].   Patient appeared uncomfortable and had shortness of breath. She said "something is wrong." No chest pain or diaphoresis. Pain was 8/10.She was tachycardic to 110s bpm, tachypneic to 40s RR, BP 152/92. Rapid response RN and primary night RN present in room on my arrival to bedside.STAT Chest X-ray, ABG, and EKG ordered.  She received Fentanyl x1 which improved her pain.  O: BP (!) 147/93 (BP Location: Right Arm)   Pulse (!) 109   Temp 97.8 F (36.6 C) (Oral)   Resp (!) 27   Ht '5\' 1"'$  (1.549 m)   Wt 66 kg   SpO2 94%   BMI 27.49 kg/m    Gen: uncomfortable-appearing, laying in bed, increased respiratory rate CV: regular rhythm, tachycardic to 110s. Slight JVD. Mild bilateral LE edema. Resp: diminished breath sounds on right side, no wheezing/crackles, respiratory rate increased to 40 breaths/min, increased work of breathing using accessory muscles. Large surgical scar on right Skin: no diaphoresis  Chest X-ray IMPRESSION: Stable bibasilar airspace opacity. No recurrent pneumothorax noted.  EKG: Sinus tachycardia with 1st degree AV block, with occasional premature ventricular contractions  ABG: pH 7.423, pCO2 26.8, pO2 59.5, Bicarb 17.2  A/P: Dyspnea/Hypoxia: Dyspnea likely from bilateral infiltrates/pain. CXR, EKG, ABG reassuring against MI, pneumothorax. Tachypnea and distress improved after fentanyl x1 and increasing O2 via [place]. She remained hemodynamically stable and mentation was normal.Will continue to monitor respiratory status closely. Wells Score 3.0, Geneva Score 8.0. -Obtained D-dimer (elevated to 19.54) and calculated age-adjusted D-dimer, which showed PE was  unlikely so CTA not ordered tonight. Will defer to day team regarding further workup/imaging. -RT increased O2 from 2L to 5L [place] due to ABG results, continue to monitor O2 sats with goal >90% -S/p x1 IV '20mg'$  Lasix for edema, dyspnea -Orders reviewed. Labs ordered for AM -Continue pain control regimen -Continue antibiotics  Orvis Brill, DO 05/19/2021, 10:47 PM PGY-1, [facility] Family Medicine Service pager [phone]

## 2021-05-19 NOTE — Progress Notes (Addendum)
      [address]       [place] [address]             [phone]       14 Days Post-Op Procedure(s) (LRB): THORACOTOMY MAJOR REPAIR PERFORATED ESOPHAGUS (Right) Subjective:  Awake and alert, complains of some soreness at the chest incision. Taking PO's in small amounts.  Only 1 BM yesterday.  Objective: Vital signs in last 24 hours: Temp:  [97.9 F (36.6 C)-99.1 F (37.3 C)] 98.6 F (37 C) (08/07 0357) Pulse Rate:  [84-105] 99 (08/07 0357) Cardiac Rhythm: Normal sinus rhythm;Sinus tachycardia (08/06 2334) Resp:  [17-30] 20 (08/07 0357) BP: (124-154)/(68-80) 145/78 (08/07 0357) SpO2:  [91 %-100 %] 96 % (08/07 0357)    Intake/Output from previous day: 08/06 0701 - 08/07 0700 In: 3894.2 [P.O.:120; I.V.:2829; Blood:348; IV Piggyback:597.1] Out: 1000 [Urine:1000] Intake/Output this shift: No intake/output data recorded.  General appearance: fatigued Heart: regular rate and rhythm Lungs: diminished breath sounds bibasilar, clear anterior Abdomen: soft, non-tender; bowel sounds normal Wound: the right thoracotomy incision is approximated with staples and is clean and dry. The chest tube exit sites are dry with silk sutures in place.  Lab Results: Recent Labs    05/18/21 1725 05/19/21 0443  WBC 14.6* 11.8*  HGB 8.7* 9.6*  HCT 26.0* 28.5*  PLT 327 311    BMET:  Recent Labs    05/17/21 0500 05/18/21 0619  NA 136 135  K 3.7 3.4*  CL 106 106  CO2 23 22  GLUCOSE 128* 157*  BUN 24* 26*  CREATININE 0.74 0.74  CALCIUM 8.4* 8.1*     PT/INR: No results for input(s): LABPROT, INR in the last 72 hours. ABG    Component Value Date/Time   PHART 7.415 05/06/2021 0113   HCO3 27.8 05/07/2021 0516   TCO2 29 05/07/2021 0516   ACIDBASEDEF 14.0 (H) 05/04/2021 2049   O2SAT 96.0 05/07/2021 0516   CBG (last 3)  Recent Labs    05/18/21 2320 05/19/21 0354 05/19/21 0747  GLUCAP 203* 178* 185*    CXR: stable bibasilar atelectasis. Postop changes  on the right.  Assessment/Plan: S/P Procedure(s) (LRB): THORACOTOMY MAJOR REPAIR PERFORATED ESOPHAGUS (Right)  POD 14 -Hemodynamically stable in sinus rhythm. -Tolerating PO's but only in small amounts. Continuing TPN. -Deconditioned state -  Continue to encourage IS, ambulation with PT. -Diarrhea- improved with Imodium -Hypokalemia- to be addressed per pharmacy with TPN     LOS: 15 days    Antony Odea, PA-C [phone] 05/19/2021   Agree with above Diarrhea improved Will keep staples for now  [place]

## 2021-05-19 NOTE — Plan of Care (Signed)
  Problem: Clinical Measurements: Goal: Respiratory complications will improve Outcome: Progressing Goal: Cardiovascular complication will be avoided Outcome: Progressing   Problem: Health Behavior/Discharge Planning: Goal: Ability to manage health-related needs will improve Outcome: Not Progressing   

## 2021-05-19 NOTE — Progress Notes (Signed)
Respiratory increased oxygen to 5L d/t ABG results.  MD called with same request of 4-5 liters

## 2021-05-19 NOTE — Progress Notes (Signed)
Notified on call for patient's increased WOB. Respirations 30-40's. Accessory muscles being used. Right lower lobe no movement heard. MDs at bedside. CXR, ABG, EKG and lasixs ordered.   Fentanyl given and lasixs given.

## 2021-05-19 NOTE — Significant Event (Addendum)
Rapid Response Event Note   Reason for Call :  SOB  Initial Focused Assessment:  Pt lying in bed in respiratory distress, +WOB, +accessory muscle use. Lungs diminished on R side. JVD present. Skin warm to touch.   T-97.8, HR-112, BP-152/92, RR-38, SpO2-92% on 2L [place].    Interventions:  PCXR-Stable bibasilar airspace opacity. No recurrent pneumothorax is noted. ABG EKG-ST with 1st degree AV block, PVCs, R BBB, bifascicular block 41mg fentanyl IV 238m lasix IV Plan of Care:  Pt says she feels little better after interventions. She is still tachy and tachypneic. Monitor response from lasix. Call RRT if further assistance needed.    Event Summary:   MD Notified: Dr. WaVolanda Napoleonnd Dr. DaOwens Sharkotfied PTA RRT and at bedside on RRT arrival Call TiMohall [phone]-Called by bedside RN. Pt now cool/clammy/diaphoretic, still tachypneic. Pt went into Afib RVR 140s around 0030 for which she was given PO metoprolol and started on a heparin gtt. Pt is now Afib-110s. LA/ABG /CTA chest ordered. '5mg'$  metoprolol given IV. Just after giving metoprolol, pt HR dropped to 30s and she became nauseous. This bradycardia lasted only a few minutes and pt HR increased back to 100s. Pt then converted back to SR with 1st degree HB-90s. ABG-7.41/23/60.8/15.1, LA-2.7.  Plan: CTA chest to r/o PE when able. Pt appears to be perfusing better since converted back to SR(coolness/clamminess/diaphoresis better). Continue to monitor pt closely.    HoDillard EssexRN

## 2021-05-19 NOTE — Progress Notes (Signed)
Notified on call teaching service of pt c/o not being able to breathe. Patient was on 2L [place] sats 94-95%.  Saline spray ordered with no relief. Oxygen humidified with no relief. Patient continued to c/o not being able to breathe and pain was bot relieved.

## 2021-05-19 NOTE — Progress Notes (Signed)
Patient continues to c/o pain 7-8 despite 10 oxy. Patient has had to have 25 mcg fentanyl in addition with little or no relief from the pain. Will continue to monitor

## 2021-05-19 NOTE — Progress Notes (Signed)
Family Medicine Teaching Service Daily Progress Note Intern Pager: [phone]  Patient name: Dana Coleman Medical record number: [id number] Date of birth: 04/19/1941 Age: 80 y.o. Gender: female  Primary Care Provider: Gifford Shave, MD Consultants: General surgery, CVTS, nephrology (s/o), PCCM (s/o) Code Status: Full  Pt Overview and Major Events to Date:  7/23: right pigtail catheter placed, admitted to ICU, intubated 7/24: CRRT, emergent right thoracotomy, repair of esophageal perforation with intercostal pedicled muscle flap and drainage of empyema and decortication of right lung 7/25: probable inferior NSTEMI, no acute intervention. Echo with mildly reduced LV, large RV 7/26: extubated, double-lumen PICC placed, TPN started 8/1: Esophagram without leak, started on PO, heparin gtt stopped due to increased bleeding from chest tubes. Declotted by CVTS. Transfused 1u pRBC 8/3: anterior chest tube removed 8/4: remaining chest tube removed  Abx Timeline Zosyn: 7/23-7/26 Eraxis: 7/23- Unasyn: 7/26-7/31 Meropenem: 7/31-8/3 Vancomycin: 8/1-8/3 Ceftriaxone: 8/3- Metronidazole: 8/3-  Micro Timeline 7/23: MRSA PCR negative 7/23: Pleural fluid grew strep salivarius pan-sensitive, strep mitas (sensitivie to CTX), rare candida albicans 7/24: Blood cultures negative x 2 7/30: Blood cultures negative x 2  Assessment and Plan: Continue to monitor 80 year old female who was admitted on 7/23 for AMS and SOB, subsequently found to have acute renal failure, esophageal perforation now POD #14 from repair.  Admission thus far is also complicated by probable inferior NSTEMI, acute blood loss anemia requiring 1 unit PRBC and atrial fibrillation.  Past medical history significant for type 1 diabetes, CAD, hypertension, hyperlipidemia, MI (2004), Hurthle cell adenoma (2004), cataracts, cervical cancer.  Esophageal perforation s/p repair  empyema (resolved) POD #14, patient reports continued  right-sided back pain that she rates as 6 out of 10 but she believes it is improving and that the pain medications are allowing her to tolerate the pain appropriately.  WBC improved from 14.6 to 11.8.  Patient remained afebrile overnight.  Current antibiotic regimen includes metronidazole 500 mg twice daily, ceftriaxone 2 g daily, Eraxis 100 mg daily. -Continue to appreciate CVTS recommendations -Continue Eraxis 100 mg daily -Continue metronidazole 500 mg twice daily -Continue ceftriaxone 2 g daily -Antibiotic treatment will be completed on 05/23/2021 -Continue Tylenol 650 mg every 4 hours as needed -continue oxycodone 5 to 10 mg every 6 hours as needed   Hypoxia, improving Patient was extubated on 7/26.  She is currently on 2 L via [place]. -Continue to monitor O2 sats with goal saturation>90%  Severe protein malnutrition  deconditioned state Patient has not been tolerating p.o. feeds well and continues to receive TPN. -Awaiting results of stool pathogen testing -Nutrition following, appreciate recommendations -Continue ambulation with PT  Anemia Hemoglobin stable at 9.6 today from 8.7 previously. -Transfusion threshold <8.0  NSTEMI  A-flutter Normal sinus rhythm and without chest pain. -Continue cardiac monitoring -Continue metoprolol 2.5 mg IV every 6 hours  T2DM -Continue sliding scale insulin  Hypokalemia Patient's potassium from yesterday was notably 3.4. -Recheck CMP today, replete potassium as necessary  FEN/GI: Dysphagia 3 diet PPx: Lovenox, Protonix Dispo: Continues to require inpatient admission  pending clinical improvement . Barriers include requiring TPN for nutrition, deconditioning secondary to ICU course.   Subjective:  Patient appeared well this morning and tolerated conversation.  She said she would try to eat some of her breakfast this morning.  She endorsed only right-sided flank pain and states it is 6 out of 10 but well controlled with pain medications and  she feels that she is improving.  Objective: Temp:  [97.9 F (36.6 C)-99.1  F (37.3 C)] 97.9 F (36.6 C) (08/07 1130) Pulse Rate:  [88-105] 88 (08/07 1130) Resp:  [17-30] 19 (08/07 1130) BP: (109-154)/(70-84) 109/84 (08/07 1130) SpO2:  [91 %-99 %] 96 % (08/07 0910) Physical Exam: General: Lying in bed, able to converse, no acute distress Cardiovascular: RRR, no murmurs auscultated Respiratory: CTA B, no increased work of breathing, nasal cannula in place Abdomen: Soft, nondistended, normoactive bowel sounds Wound: Staples appear clean and dry on right flank  Laboratory: Recent Labs  Lab 05/18/21 0405 05/18/21 1725 05/19/21 0443  WBC 11.2* 14.6* 11.8*  HGB 7.7* 8.7* 9.6*  HCT 23.7* 26.0* 28.5*  PLT 278 327 311   Recent Labs  Lab 05/16/21 0729 05/17/21 0500 05/18/21 0619  NA 139 136 135  K 5.4* 3.7 3.4*  CL 116* 106 106  CO2 15* 23 22  BUN 33* 24* 26*  CREATININE 0.79 0.74 0.74  CALCIUM 8.8* 8.4* 8.1*  PROT 5.3* 5.2* 5.0*  BILITOT 0.8 0.3 0.4  ALKPHOS 115 99 83  ALT 271* 180* 106*  AST 352* 103* 40  GLUCOSE 112* 128* 157*   CBG (last 3)  Recent Labs    05/19/21 0354 05/19/21 0747 05/19/21 1139  GLUCAP 178* 185* 105*    Imaging/Diagnostic Tests: No new imaging/tests at this time.  Wells Guiles, DO 05/19/2021, 1:03 PM PGY-1, [place] Intern pager: [phone], text pages welcome

## 2021-05-19 NOTE — Progress Notes (Signed)
PHARMACY - TOTAL PARENTERAL NUTRITION CONSULT NOTE  Indication: s/p esophageal perforation and repair  Patient Measurements: Height: '5\' 1"'$  (154.9 cm) Weight: 66 kg (145 lb 8.1 oz) IBW/kg (Calculated) : 47.8   Body mass index is 27.49 kg/m.  Assessment:  80 y/o female with recent lap chole for symptomatic cholelithiasis on 7/21. PMH including HTN, HLD, DM, R breast cancer, and CAD. Patient presented on 7/23 with AMS and unresponsiveness - was complaining of abdominal discomfort and then patient became unresponsive and slumped on table.  S/p gall bladder surgery and esophageal perforation-repaired 7/24.  Pharmacy consulted for TPN management.  Glucose / Insulin: DM with A1c 7.4% on Lantus 24/d PTA. CBGs while on TPN were 178-203 with 65 units in TPN, CBG 180 off TPN.  Utilized 12 units SSI since TPN start + 65 units insulin in TPN  Electrolytes: Na down to low normal, Cl and CO2 now WNL, K 3.4 - 4 runs given 8/6,  CoCa 10.3 (no Ca in TPN), others WNL  Renal: septic ATN s/p CRRT (end 7/25) - SCr < 1 and stable, BUN up to 26 (s/p Lasix 8/4) Hepatic: LFTs significantly increased 8/4 (decreased with holding TPN for 6 hours and Lasix), tbili in normal limits, albumin down 1.2, prealbumin up 8.8, TG down to 113 Intake / Output; MIVF:  UOP 1L + urine occurrence x2, chest tube removed, net +10.5L, +BM -  diarrhea with po intake GI Imaging: - 7/23 CT: no acute abnormalities  GI Surgeries / Procedures:  - 7/21: s/p laparoscopic cholecystectomy and LOA - 7/24: s/p R thoracotomy, repair of esophageal perf with muscle flap, drainage of empyema and decortication of R lung  Central access: PICC 7/26 (double lumen) TPN start date: 05/07/21  Nutritional Goals (per RD rec on 8/2): kCal: 1750-2030 , Protein: 87-116 , Fluid: >=1.8L  Current Nutrition:  TPN Soft diet on 8/6 - very low intake, declining help with feeding *No feeding tube recommended due to esophageal injury  Plan:  Increased back to  goal TPN 8/5 due to low amount of po intake. Started cycling TPN to allow for time periods of less liver stimulation. Hold further cycling until CBGs better controlled.  Infuse 1920 mL over 16 hrs : 64 mL/hr x 1 hr, then 128 mL/hr x 14 hrs, then 64 mL/hr x 1 hr. This will provide 111g AA, 230g CHO, and 58g ILE for total of 1805 kCal, meeting 100% of patient's needs. Electrolytes in TPN: continue low dose Na 31mq/L, K to 25 mEq/L, Ca 021m/L since 7/30, Mg 15m33mL, Phos 86m54mL, maximum acetate Add standard MVI and trace elements to TPN Continue CVTS SSI Q4H and adjust insulin in TPN to 73 units.  Monitor TPN labs on Mon/Thurs  Thank you for involving pharmacy in this patient's care.  JennRenold GentaarmD, BCPS Clinical Pharmacist Clinical phone for 05/19/2021 until 3p is [phone]/2022 7:02 AM  **Pharmacist phone directory can be found on [email or web address] listed under [facility]

## 2021-05-20 ENCOUNTER — Inpatient Hospital Stay (HOSPITAL_COMMUNITY): Payer: Medicare Other

## 2021-05-20 DIAGNOSIS — J810 Acute pulmonary edema: Secondary | ICD-10-CM | POA: Diagnosis not present

## 2021-05-20 DIAGNOSIS — N179 Acute kidney failure, unspecified: Secondary | ICD-10-CM | POA: Diagnosis not present

## 2021-05-20 DIAGNOSIS — J9601 Acute respiratory failure with hypoxia: Secondary | ICD-10-CM | POA: Diagnosis not present

## 2021-05-20 DIAGNOSIS — J9602 Acute respiratory failure with hypercapnia: Secondary | ICD-10-CM | POA: Diagnosis not present

## 2021-05-20 LAB — BPAM RBC
Blood Product Expiration Date: 202209022359
ISSUE DATE / TIME: 202208062315
Unit Type and Rh: 6200

## 2021-05-20 LAB — LACTIC ACID, PLASMA
Lactic Acid, Venous: 2.7 mmol/L (ref 0.5–1.9)
Lactic Acid, Venous: 2.2 mmol/L (ref 0.5–1.9)

## 2021-05-20 LAB — COMPREHENSIVE METABOLIC PANEL
ALT: 61 U/L — ABNORMAL HIGH (ref 0–44)
AST: 29 U/L (ref 15–41)
Albumin: 1.3 g/dL — ABNORMAL LOW (ref 3.5–5.0)
Alkaline Phosphatase: 83 U/L (ref 38–126)
Anion gap: 8 (ref 5–15)
BUN: 25 mg/dL — ABNORMAL HIGH (ref 8–23)
CO2: 20 mmol/L — ABNORMAL LOW (ref 22–32)
Calcium: 8.2 mg/dL — ABNORMAL LOW (ref 8.9–10.3)
Chloride: 104 mmol/L (ref 98–111)
Creatinine, Ser: 0.76 mg/dL (ref 0.44–1.00)
GFR, Estimated: >60 mL/min (ref >60)
Glucose, Bld: 234 mg/dL — ABNORMAL HIGH (ref 70–99)
Potassium: 3.9 mmol/L (ref 3.5–5.1)
Sodium: 132 mmol/L — ABNORMAL LOW (ref 135–145)
Total Bilirubin: 0.3 mg/dL (ref 0.3–1.2)
Total Protein: 5.7 g/dL — ABNORMAL LOW (ref 6.5–8.1)

## 2021-05-20 LAB — BLOOD GAS, ARTERIAL
Acid-base deficit: 8.6 mmol/L — ABNORMAL HIGH (ref 0.0–2.0)
Bicarbonate: 15.1 mmol/L — ABNORMAL LOW (ref 20.0–28.0)
FIO2: 40
O2 Saturation: 90.3 %
Patient temperature: 36.7
pCO2 arterial: 23.8 mmHg — ABNORMAL LOW (ref 32.0–48.0)
pH, Arterial: 7.417 (ref 7.350–7.450)
pO2, Arterial: 60.8 mmHg — ABNORMAL LOW (ref 83.0–108.0)

## 2021-05-20 LAB — PREALBUMIN: Prealbumin: 8.9 mg/dL — ABNORMAL LOW (ref 18–38)

## 2021-05-20 LAB — TSH: TSH: 1.196 u[IU]/mL (ref 0.350–4.500)

## 2021-05-20 LAB — TYPE AND SCREEN
ABO/RH(D): A POS
Antibody Screen: NEGATIVE
Unit division: 0

## 2021-05-20 LAB — CBC
HCT: 31.2 % — ABNORMAL LOW (ref 36.0–46.0)
Hemoglobin: 10.4 g/dL — ABNORMAL LOW (ref 12.0–15.0)
MCH: 30.1 pg (ref 26.0–34.0)
MCHC: 33.3 g/dL (ref 30.0–36.0)
MCV: 90.2 fL (ref 80.0–100.0)
Platelets: 344 10*3/uL (ref 150–400)
RBC: 3.46 MIL/uL — ABNORMAL LOW (ref 3.87–5.11)
RDW: 16.5 % — ABNORMAL HIGH (ref 11.5–15.5)
WBC: 12.9 10*3/uL — ABNORMAL HIGH (ref 4.0–10.5)
nRBC: 0 % (ref 0.0–0.2)

## 2021-05-20 LAB — MAGNESIUM: Magnesium: 2 mg/dL (ref 1.7–2.4)

## 2021-05-20 LAB — DIFFERENTIAL
Abs Immature Granulocytes: 0.15 10*3/uL — ABNORMAL HIGH (ref 0.00–0.07)
Basophils Absolute: 0 10*3/uL (ref 0.0–0.1)
Basophils Relative: 0 %
Eosinophils Absolute: 0.1 10*3/uL (ref 0.0–0.5)
Eosinophils Relative: 0 %
Immature Granulocytes: 1 %
Lymphocytes Relative: 5 %
Lymphs Abs: 0.6 10*3/uL — ABNORMAL LOW (ref 0.7–4.0)
Monocytes Absolute: 0.9 10*3/uL (ref 0.1–1.0)
Monocytes Relative: 7 %
Neutro Abs: 11.2 10*3/uL — ABNORMAL HIGH (ref 1.7–7.7)
Neutrophils Relative %: 87 %

## 2021-05-20 LAB — GLUCOSE, CAPILLARY
Glucose-Capillary: 110 mg/dL — ABNORMAL HIGH (ref 70–99)
Glucose-Capillary: 144 mg/dL — ABNORMAL HIGH (ref 70–99)
Glucose-Capillary: 198 mg/dL — ABNORMAL HIGH (ref 70–99)
Glucose-Capillary: 222 mg/dL — ABNORMAL HIGH (ref 70–99)
Glucose-Capillary: 240 mg/dL — ABNORMAL HIGH (ref 70–99)
Glucose-Capillary: 255 mg/dL — ABNORMAL HIGH (ref 70–99)

## 2021-05-20 LAB — TROPONIN I (HIGH SENSITIVITY)
Troponin I (High Sensitivity): 837 ng/L (ref <18)
Troponin I (High Sensitivity): 840 ng/L (ref <18)

## 2021-05-20 LAB — HEPARIN LEVEL (UNFRACTIONATED)
Heparin Unfractionated: 0.18 IU/mL — ABNORMAL LOW (ref 0.30–0.70)
Heparin Unfractionated: 0.13 IU/mL — ABNORMAL LOW (ref 0.30–0.70)

## 2021-05-20 LAB — TRIGLYCERIDES: Triglycerides: 105 mg/dL (ref <150)

## 2021-05-20 LAB — BRAIN NATRIURETIC PEPTIDE
B Natriuretic Peptide: 1853 pg/mL — ABNORMAL HIGH (ref 0.0–100.0)
B Natriuretic Peptide: 2163.2 pg/mL — ABNORMAL HIGH (ref 0.0–100.0)

## 2021-05-20 LAB — D-DIMER, QUANTITATIVE: D-Dimer, Quant: 19.54 ug/mL-FEU — ABNORMAL HIGH (ref 0.00–0.50)

## 2021-05-20 LAB — PHOSPHORUS: Phosphorus: 3.3 mg/dL (ref 2.5–4.6)

## 2021-05-20 MED ORDER — HEPARIN (PORCINE) 25000 UT/250ML-% IV SOLN
1650.0000 [IU]/h | INTRAVENOUS | Status: DC
  Administered 2021-05-20: 1000 [IU]/h via INTRAVENOUS
  Administered 2021-05-20: 1400 [IU]/h via INTRAVENOUS
  Administered 2021-05-21: 1500 [IU]/h via INTRAVENOUS
  Administered 2021-05-22: 1550 [IU]/h via INTRAVENOUS
  Administered 2021-05-23: 1700 [IU]/h via INTRAVENOUS
  Filled 2021-05-20 (×8): qty 250

## 2021-05-20 MED ORDER — METOPROLOL TARTRATE 25 MG PO TABS
25.0000 mg | ORAL_TABLET | Freq: Two times a day (BID) | ORAL | Status: DC
  Administered 2021-05-20: 25 mg via ORAL
  Filled 2021-05-20 (×2): qty 1

## 2021-05-20 MED ORDER — METOPROLOL TARTRATE 5 MG/5ML IV SOLN
5.0000 mg | Freq: Four times a day (QID) | INTRAVENOUS | Status: DC
  Administered 2021-05-20 – 2021-05-24 (×15): 5 mg via INTRAVENOUS
  Filled 2021-05-20 (×15): qty 5

## 2021-05-20 MED ORDER — IOHEXOL 350 MG/ML SOLN
50.0000 mL | Freq: Once | INTRAVENOUS | Status: AC | PRN
  Administered 2021-05-20: 50 mL via INTRAVENOUS

## 2021-05-20 MED ORDER — METOPROLOL TARTRATE 5 MG/5ML IV SOLN
5.0000 mg | Freq: Once | INTRAVENOUS | Status: AC
  Administered 2021-05-20: 5 mg via INTRAVENOUS
  Filled 2021-05-20: qty 5

## 2021-05-20 MED ORDER — ALBUMIN HUMAN 25 % IV SOLN
25.0000 g | Freq: Four times a day (QID) | INTRAVENOUS | Status: AC
Start: 2021-05-20 — End: 2021-05-20
  Administered 2021-05-20 (×2): 25 g via INTRAVENOUS
  Filled 2021-05-20 (×2): qty 100

## 2021-05-20 MED ORDER — HEPARIN BOLUS VIA INFUSION
2000.0000 [IU] | Freq: Once | INTRAVENOUS | Status: AC
  Administered 2021-05-20: 2000 [IU] via INTRAVENOUS
  Filled 2021-05-20: qty 2000

## 2021-05-20 MED ORDER — TRACE MINERALS CU-MN-SE-ZN 300-55-60-3000 MCG/ML IV SOLN
INTRAVENOUS | Status: AC
  Filled 2021-05-20: qty 745.53

## 2021-05-20 MED ORDER — HEPARIN BOLUS VIA INFUSION
3000.0000 [IU] | Freq: Once | INTRAVENOUS | Status: AC
  Administered 2021-05-20: 3000 [IU] via INTRAVENOUS
  Filled 2021-05-20: qty 3000

## 2021-05-20 MED ORDER — FUROSEMIDE 10 MG/ML IJ SOLN
40.0000 mg | Freq: Four times a day (QID) | INTRAMUSCULAR | Status: AC
Start: 2021-05-20 — End: 2021-05-20
  Administered 2021-05-20 (×2): 40 mg via INTRAVENOUS
  Filled 2021-05-20 (×2): qty 4

## 2021-05-20 MED ORDER — FUROSEMIDE 10 MG/ML IJ SOLN: 20.0000 mg | Freq: Once | INTRAMUSCULAR | Status: DC

## 2021-05-20 MED ORDER — POTASSIUM CHLORIDE 10 MEQ/100ML IV SOLN
10.0000 meq | INTRAVENOUS | Status: AC
Start: 2021-05-20 — End: 2021-05-20
  Administered 2021-05-20 (×2): 10 meq via INTRAVENOUS
  Filled 2021-05-20 (×2): qty 100

## 2021-05-20 MED ORDER — POTASSIUM CHLORIDE CRYS ER 20 MEQ PO TBCR
40.0000 meq | EXTENDED_RELEASE_TABLET | Freq: Two times a day (BID) | ORAL | Status: DC
  Filled 2021-05-20: qty 2

## 2021-05-20 NOTE — Progress Notes (Signed)
Inpatient Rehab Admissions Coordinator:   I received a request for peer to peer for CIR authorization; however, Pt. Experienced a change in medical status and I do not feel she is ready for CIR at this time. I will withdraw the case for insurance and re-open once Pt. Is closer to being medically ready.  Clemens Catholic, [place], [place] Admissions Coordinator  [phone] ([place]) [phone] (office)

## 2021-05-20 NOTE — Progress Notes (Addendum)
   NAME:  Dana Coleman, MRN:  [id number], DOB:  06/11/1941, LOS: 90 ADMISSION DATE:  05/04/2021, CONSULTATION DATE:  7/23 REFERRING MD:  Ralene Bathe, CHIEF COMPLAINT:  Dyspnea   History of Present Illness:  80 y/o female admitted for empyema after a lap chole, noted to have esophageal perforation.  Pertinent  Medical History  DM2 CAD Hypertension Hyperlipidemia Prior R breast cancer  Significant Hospital Events: Including procedures, antibiotic start and stop dates in addition to other pertinent events   7/23 admitted intubated. Pigtail by CCS in ED -- interestingly seems to have bilious outpt 7/24 Starting CRRT. Abundant GPCs in pleural fluid 7/24 1.  Right thoracotomy 2.  Repair of esophageal perforation with intercostal pedicled muscle flap. 3.  Drainage of empyema and decortication of right lung 7/25 early AM EKG changes, trop leak, probable inferiror NSTEMI, not candidate for intervention.  Echo with mildly reduced LV, large RV ?cause 7/26 extubated 7/29 up out of bed, rising WBC 7/30 blood culture >  7/31 WBC up again, cough with mucus production, chest pain 8/1 elevated WBC, added Vanco, MRSA swab pending, CT stripped from clot by [place]  8/2 pain control, OOB, passed swallow, started clears  8/3 mobility  8/8 increased O2 needs, PE on imaging, reconsulted  Interim History / Subjective:  Called back for worsening respiratory status. Did not sleep well. CT changed noted.  Objective   Blood pressure 122/70, pulse 98, temperature 98.1 F (36.7 C), temperature source Oral, resp. rate (!) 33, height '5\' 1"'$  (1.549 m), weight 66 kg, SpO2 97 %.        Intake/Output Summary (Last 24 hours) at 05/20/2021 [id number] Last data filed at 05/20/2021 [id number] Gross per 24 hour  Intake 1592.93 ml  Output 1950 ml  Net -357.07 ml    Filed Weights   05/10/21 0500 05/14/21 0444 05/15/21 0400  Weight: 60.5 kg 65.6 kg 66 kg    Examination: Constitutional: frail elderly woman in mild respiratory  distress  Eyes: EOMI, pupils equal Ears, nose, mouth, and throat: MMM, trachea midline Cardiovascular: RRR, sinus on monitor (had runs of afib) Respiratory: diminished bases with crackles, RR in 30s Gastrointestinal: soft, +BS Skin: No rashes, normal turgor, R chest incision site healing well Neurologic: moves all 4 ext, remains profoundly weak Psychiatric: understandable anxious   Resolved Hospital Problem list   Bleeding from chest tube, small hemothorax, clot and chest tube (improved)  AKI   Assessment & Plan:  Acute hypoxemic respiratory failure- secondary to volume ovelroad, third spacing from poor nutrition w/ pleural effusions, interlobular septal thickening, new PE.  Weakness and poor cough mechanics do not help. Reactive Afib Post op primary esophageal repair for primary esophageal rupture with polymicrobial empyema (05/05/21) Profound muscular deconditioning and malnutrition- on TPN due to inability to keep up with nutritional demans NSTEMI earlier in stay  - Advance PE therapy review: systemic TPA in setting of recent esophagectomy contraindicated, size/location of clots make mechanical thrombectomy unlikely to be successful; only option here would be catheter-directed tpa.  Will see what echo looks like and go from there. - Push diuresis - Replete K - Encourage IS - Trial of HFNC - Spoke with charge to reach out if any decompensation - Will follow closely with you  Erskine Emery MD PCCM

## 2021-05-20 NOTE — Progress Notes (Signed)
PHARMACY - TOTAL PARENTERAL NUTRITION CONSULT NOTE  Indication: s/p esophageal perforation and repair  Patient Measurements: Height: '5\' 1"'$  (154.9 cm) Weight: 66 kg (145 lb 8.1 oz) IBW/kg (Calculated) : 47.8   Body mass index is 27.49 kg/m.  Assessment:  80 y/o female with recent lap chole for symptomatic cholelithiasis on 7/21. PMH including HTN, HLD, DM, R breast cancer, and CAD. Patient presented on 7/23 with AMS and unresponsiveness - was complaining of abdominal discomfort and then patient became unresponsive and slumped on table.  S/p gall bladder surgery and esophageal perforation-repaired 7/24.  Pharmacy consulted for TPN management.  Glucose / Insulin: DM with A1c 7.4% on Lantus 24/d PTA. CBGs 200s while on TPN - ?stress response, 78 units in TPN, CBGs <120 while off TPN.  Utilized 40 units SSI since TPN start + 73 units insulin in TPN  Electrolytes: Na down to 132, Cl WNL, CO2 low, K 3.9,  CoCa 10.4 (no Ca in TPN), others WNL  Renal: septic ATN s/p CRRT (end 7/25) - SCr < 1 and stable, BUN 25 (s/p Lasix 8/4) Hepatic: LFTs significantly increased 8/4 (decreased with holding TPN for 6 hours and Lasix), tbili in normal limits, albumin up 1.3, prealbumin up 8.9, TG WNL Intake / Output; MIVF:  UOP 1.95L, diuresing with furos 40 mg IV q6h x2 today, net +9.1L (down), chest tube removed, LBM 8/6 -  diarrhea with po intake GI Imaging: - 7/23 CT: no acute abnormalities  GI Surgeries / Procedures:  - 7/21: s/p laparoscopic cholecystectomy and LOA - 7/24: s/p R thoracotomy, repair of esophageal perf with muscle flap, drainage of empyema and decortication of R lung  Central access: PICC 7/26 (double lumen) TPN start date: 05/07/21  Nutritional Goals (per RD rec on 8/2): kCal: 1750-2030 , Protein: 87-116 , Fluid: >=1.8L  Current Nutrition:  TPN Soft diet on 8/6 - very low intake, declining help with feeding *No feeding tube recommended due to esophageal injury  Plan:  Increased back  to goal TPN 8/5 due to low amount of po intake. Started cycling TPN to allow for time periods of less liver stimulation. Concentrate TPN today. Hold further cycling until CBGs better controlled.  Infuse 1575 mL over 16 hrs : 53 mL/hr x 1 hr, then 105 mL/hr x 14 hrs, then 53 mL/hr x 1 hr. This will provide 111g AA, 230g CHO, and 58g ILE for total of 1803 kCal, meeting 100% of patient's needs. Electrolytes in TPN matched to TPN 8/7: Na 30 mEq/L (= 47 mEq), K 30 mEq/L (= 47 mEq), Ca 73mq/L since 7/30, Mg 11 mEq/L (=17 mEq), Phos 30 mmol/L (= 47 mmol), maximum acetate Adjust lytes outside of TPN while diuresing Add standard MVI and trace elements to TPN Continue CVTS SSI Q4H and adjust insulin in TPN to 78 units.  Monitor TPN labs on Mon/Thurs  Thank you for involving pharmacy in this patient's care.  JRenold Genta PharmD, BCPS Clinical Pharmacist Clinical phone for 05/20/2021 until 3p is [phone]/05/2021 7:12 AM  **Pharmacist phone directory can be found on [email or web address] listed under [email or web address]*

## 2021-05-20 NOTE — Progress Notes (Signed)
FPTS Interim Night Progress Note  S:Patient continues to have shortness of breath without chest pain.  Now diaphoretic and cool extremities.  Remains in Afib and BP stable.    O: Today's Vitals   05/20/21 0113 05/20/21 0200 05/20/21 0312 05/20/21 0406  BP: 137/90 128/82 122/70   Pulse: (!) 109 98    Resp: (!) 42 (!) 39  (!) 36  Temp:  98.1 F (36.7 C)    TempSrc:  Oral    SpO2:    97%  Weight:      Height:      PainSc:          A/P: Dyspnea with Hypoxia Elevated D Dimer, age adjusted less likely VTE but given increasing need for oxygen will obtain CTA chest Repeat ABG, Lactic Acid CTA chest pending Troponin elevated likely secondary to demand.  ECG shows Afib without STEMI. Vital signs stable but given acute changes and now diaphoresis with peripheral coolness reach out to cardiology for evaluation Spoke with Cardiology and no plans for cardiac intervention.  Metoprolol 5 mg IV now RN called and reports after received Metoprolol patient had episode of bradycardia to 30-40's, maintained BP.  Now converted to SR with HR 90 Will continue to monitor   Carollee Leitz MD PGY-3, [place] [facility] Service pager [phone]

## 2021-05-20 NOTE — Progress Notes (Signed)
Resident team contacting regarding Afib RVR (HR in 105)  Chart reviewed Patient with prolonged hospitalization, multiple medical problems- esophageal perforation s/p repair, right sided emyema s/p chest tube, respiratory failure- on 2lts oxygen, anemia now went into afib RVR (but HR was only 105-110s), BP stable. Resident were concerned about afib-> patient was only getting 2.'5mg'$  of metoprolol. I advised them to give '25mg'$  Metoprolol BID and start anticoagulation with heparin given now no bleeding.  Again, called back saying patient still in afib and hypoxic, cold clammy. BP 122/70s. I went and saw the patient- she is tachypneic, her CXR from yesterday shows b/l pleural effusions, right lung aeration is poor s/p recent chest tube and ABG shows hypoxia- high d-dimer  -> advised high probability of PE- get CT PE -> Afib is a trigger of hypoxia and other primary cause.  Rx the underlying cause, continue rate control with metoprolol and heparin gtt -> Hypoxia- multifactorial as above  Please reach out back to [place] if still have cardiac questions/concerns.  Renae Fickle, MD Cardiology coverage

## 2021-05-20 NOTE — Progress Notes (Signed)
Family Medicine Teaching Service Daily Progress Note Intern Pager: [phone]  Patient name: Dana Coleman Medical record number: [id number] Date of birth: 1941/03/29 Age: 80 y.o. Gender: female  Primary Care Provider: Gifford Shave, MD Consultants: Pulmonology, CCM ,General surgery, CVTS, nephrology (s/o) Code Status: Full  Pt Overview and Major Events to Date:  7/23: right pigtail catheter placed, admitted to ICU, intubated 7/24: CRRT, emergent right thoracotomy, repair of esophageal perforation with intercostal pedicled muscle flap and drainage of empyema and decortication of right lung 7/25: probably inferior NSTEMI, no acute intervention. Echo with mildly reduced LV, large RV 7/26: extubated, double-lumen PICC placed, TPN started 8/1: Esophagram without leak, started on PO, heparin gtt stopped due to increased bleeding from chest tubes. Declotted by CVTS. Transfused 1u pRBC 8/3: anterior chest tube removed 8/4: remaining chest tube removed 8/8: increased O2 requirement, Afib RVR -> PE with right heart strain  Abx timeline Zosyn: 7/23-7/26 Eraxis: 7/23- Unasyn: 7/26-7/31 Meropenem: 7/31-8/3 Ceftriaxone: 8/3- Metronidazole: 8/3-  Micro Timeline 7/23: MRSA PCR negative 7/23: Pleural fluid grew strep salivarius pan-sensitive, strep mitas (sensitive to CTX), rare candida albicans 7/24: Blood cultures negative x2 7/30: Blood cultures negative x2  Assessment and Plan: Dana Coleman is a 80 year old female who was admitted on 7/23 for AMS and SOB, subsequently found to have acute renal failure, esophageal perforation now POD #15 from repair.  Her admission thus far is also complicated by probable inferior STEMI, acute blood loss anemia requiring 1 unitpRBC, atrial fibrillation and A. fib with RVR with consequent PE with right heart strain.  Past medical history significant for type 1 diabetes, CAD, HTN, HLD, MI (2004), R cell adenoma (2004), cataracts, cervical  cancer.  Esophageal perforation status postrepair  empyema (resolved) POD #15, patient does not currently endorse right-sided back pain but is not able to engage in as much active conversation due to overnight events.  WBC elevated at 12.9.  -Continue Eraxis 100 mg daily -Continue metronidazole 500 mg twice daily -Continue ceftriaxone 2 g daily -Antibiotic course to be completed on 05/23/2021 -Continue Tylenol 6 and 50 mg every 4 hours as needed -Continue oxycodone 5 to 10 mg every 6 hours as needed   A. fib with RVR  PE with right heart strain  hypoxia Condition worsened overnight which she appeared uncomfortable with shortness of breath, tachypnea, tachycardia, pleuritic pain.  She received fentanyl x1 for improved pain.  She also developed increased oxygen requirement. CTA noted PE with right heart strain after EKG noted A. fib with RVR.  Patient corrected to normal sinus rhythm at 0410 after placed on metoprolol 25 mg twice daily in addition to metoprolol 5 mg IV.  BNP greater than 2000 and troponin in the 800s.  Lactic acid elevated to 2.7. She is currently requiring 15 L O2 and tachypneic. -Transition metoprolol oral to IV since she is NPO at this time: '25mg'$  oral BID -> '5mg'$  q6h -Continue IV lasix '40mg'$  IV BID -Replete K as it should be >4.0 -Continue heparin drip 100 units/mL -Continue O2 support with goal > 90%, currently at 15L -CVTS, CCM, pulmonology continue to follow, appreciate recommendations -Consult palliative care, patient was agreeable -Echo pending  Severe protein malnutrition  deconditioned state -NPO at this time but will continue TPN feeds -GI panel negative -Pause ambulation with PT/OT  Anemia Hemoglobin 10.4 today. -Transfusion threshold <8.0   FEN/GI: NPO, cont. TPN PPx: Heparin 100 units/mL IV Dispo: pending clinical improvement . Barriers include clinical status poor.   Subjective:  Patient appears  uncomfortable and tachypneic in room.  She is unable  to have as much conversation as before due to overnight events.  She denies any pain while breathing pain anywhere feels as if it is difficult to get air into both lungs.  She is amenable to palliative care consult.  Objective: Temp:  [97.8 F (36.6 C)-98.8 F (37.1 C)] 98.3 F (36.8 C) (08/08 0734) Pulse Rate:  [87-139] 91 (08/08 0734) Resp:  [18-42] 27 (08/08 0734) BP: (109-152)/(70-94) 129/79 (08/08 0734) SpO2:  [94 %-100 %] 94 % (08/08 0734) FiO2 (%):  [50 %] 50 % (08/08 0734) Physical Exam: General: Not as active in conversation, focusing on breathing Cardiovascular: RRR, no murmurs auscultated Respiratory: Diminished right lower lobe, no crackles or wheezing Abdomen: Soft, nontender, normoactive bowel sounds Extremities: 2+ radial and tibialis pulses, +1 pitting edema lower extremities  Laboratory: Recent Labs  Lab 05/18/21 1725 05/19/21 0443 05/20/21 0110  WBC 14.6* 11.8* 12.9*  HGB 8.7* 9.6* 10.4*  HCT 26.0* 28.5* 31.2*  PLT 327 311 344   Recent Labs  Lab 05/18/21 0619 05/19/21 1405 05/20/21 0053  NA 135 134* 132*  K 3.4* 4.2 3.9  CL 106 106 104  CO2 22 19* 20*  BUN 26* 25* 25*  CREATININE 0.74 0.70 0.76  CALCIUM 8.1* 8.5* 8.2*  PROT 5.0* 6.1* 5.7*  BILITOT 0.4 0.5 0.3  ALKPHOS 83 86 83  ALT 106* 73* 61*  AST 40 32 29  GLUCOSE 157* 106* 234*    Lactic Acid, Venous    Component Value Date/Time   LATICACIDVEN 2.2 (HH) 05/20/2021 0744   BNP (last 3 results) Recent Labs    05/04/21 1632 05/20/21 0110 05/20/21 0453  BNP 2,140.9* 1,853.0* 2,163.2*   CBG (last 3)  Recent Labs    05/19/21 2350 05/20/21 0310 05/20/21 0732  GLUCAP 261* 240* 255*     Imaging/Diagnostic Tests: CTA Chest: Moderate bilateral acute PE with right heart strain.  Complex right pleural effusion compatible with empyema.  Airway thickening and secondary pulmonary lobular interstitial accentuation of the right lung.  Consolidation in the right lower lobe pulmonary hemorrhage  or pneumonia.  Small left pleural effusion. Echo pending  Dana Guiles, DO 05/20/2021, 10:12 AM PGY-1, [place] Intern pager: [phone], text pages welcome

## 2021-05-20 NOTE — Consult Note (Signed)
WOC Nurse Consult Note: Reason for Consult:Stage 2 pressure injury to sacrum and upper left buttocks.  Wound type:pressure and moisture. Volume overload and anasacra.  Pressure Injury POA: No Measurement:2.2 cm x 4 cm x 0.1 cm  irregular shape over sacrum and left buttocks.  Wound bed: pink and moist  Drainage (amount, consistency, odor) minimal serosanguinous no odor.  Periwound: dark intact tissue, extends 1.5 cm circumferentially.  Dressing procedure/placement/frequency: Cleanse sacral wound with soap and water and pat dry.  Apply barrier paste each shift and PRN peri care or soiling.  Offload pressure will order low air loss mattress replacement for bed.  Will not follow at this time.  Please re-consult if needed.  Domenic Moras MSN, RN, FNP-BC CWON Wound, Ostomy, Continence Nurse Pager [phone]

## 2021-05-20 NOTE — Progress Notes (Signed)
ANTICOAGULATION CONSULT NOTE - Initial Consult  Pharmacy Consult for heparin Indication: atrial fibrillation  No Known Allergies  Patient Measurements: Height: '5\' 1"'$  (154.9 cm) Weight: 66 kg (145 lb 8.1 oz) IBW/kg (Calculated) : 47.8 Heparin Dosing Weight: 66 kg  Vital Signs: Temp: 98.8 F (37.1 C) (08/07 2331) Temp Source: Oral (08/07 2331) BP: 129/94 (08/08 0034) Pulse Rate: 139 (08/08 0034)  Labs: Recent Labs    05/17/21 0500 05/18/21 0405 05/18/21 0405 05/18/21 0619 05/18/21 1725 05/19/21 0443 05/19/21 1405  HGB  --  7.7*   < >  --  8.7* 9.6*  --   HCT  --  23.7*  --   --  26.0* 28.5*  --   PLT  --  278  --   --  327 311  --   CREATININE 0.74  --   --  0.74  --   --  0.70   < > = values in this interval not displayed.    Estimated Creatinine Clearance: 49.6 mL/min (by C-G formula based on SCr of 0.7 mg/dL).   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 30 tablet 0 05/04/2021   amLODipine (NORVASC) 10 MG tablet TAKE 1 TABLET BY MOUTH EVERY DAY (Patient taking differently: Take 10 mg by mouth daily.) 90 tablet 3 05/04/2021   aspirin EC 81 MG tablet Take 81 mg by mouth in the morning. Swallow whole.   05/04/2021   Cholecalciferol (VITAMIN D3) 50 MCG (2000 UT) TABS Take 2,000 Units by mouth in the morning.   05/04/2021    hydrochlorothiazide (HYDRODIURIL) 12.5 MG tablet TAKE 1 TABLET BY MOUTH EVERY DAY (Patient taking differently: Take 12.5 mg by mouth daily.) 90 tablet 3 05/04/2021   JARDIANCE 10 MG TABS tablet TAKE 1 TABLET BY MOUTH EVERY DAY (Patient taking differently: Take 10 mg by mouth daily.) 90 tablet 3 05/04/2021   LANTUS SOLOSTAR 100 UNIT/ML Solostar Pen INJECT 24 UNITS INTO THE SKIN EVERY MORNING. (Patient taking differently: Inject 24 Units into the skin daily.) 3 mL 9 05/04/2021   losartan (COZAAR) 50 MG tablet Take 50 mg by mouth in the morning.   05/04/2021   metFORMIN (GLUCOPHAGE-XR) 500 MG 24 hr tablet TAKE 1 TABLET BY MOUTH TWICE A DAY WITH BREAKFAST AND DINNER (Patient taking differently: Take 500 mg by mouth 2 (two) times daily.) 180 tablet 0 05/04/2021   nitroGLYCERIN (NITROSTAT) 0.4 MG SL tablet Place 1 tablet (0.4 mg total) under the tongue every 5 (five) minutes as needed for chest pain. (Patient taking differently: Place 0.4 mg under the tongue every 5 (five) minutes x 3 doses as needed for chest pain.) 25 tablet 1 unknown   omeprazole (PRILOSEC) 40 MG capsule Take 40 mg  by mouth every morning.   05/04/2021   rosuvastatin (CRESTOR) 20 MG tablet TAKE 1 TABLET BY MOUTH EVERYDAY AT BEDTIME (Patient taking differently: Take 20 mg by mouth at bedtime.) 90 tablet 3 05/03/2021   [EXPIRED] traMADol (ULTRAM) 50 MG tablet Take 1 tablet (50 mg total) by mouth every 6 (six) hours as needed for up to 5 days. 15 tablet 0 05/04/2021   Blood Glucose Monitoring Suppl MISC One touch ultra glucose monitor. Check blood sugar twice a day.      glucose blood (ONE TOUCH ULTRA TEST) test strip CHECK BLOOD SUGAR TWICE DAILY 100 each 1    Insulin Pen Needle (B-D UF III MINI PEN NEEDLES) 31G X 5 MM MISC INJECT INSULIN VIA PEN 6 TIMES DAILY 200 each 3    OneTouch Delica Lancets 99991111 MISC 1 Container by Does not apply route as needed. 100 each PRN     Assessment: 80 yo lady to start heparin for afib.  She was not on  anticoagulation PTA.  Hg 9.6, PTLC 311. She did receive sq lovenox 40 mg at 08:59 8/7 Goal of Therapy:  Heparin level 0.3-0.7 units/ml Monitor platelets by anticoagulation protocol: Yes   Plan:  Heparin bolus 3000 units and drip at 1000 units/hr Check heparin level 6-8 hours after start Monitor for bleeding complications  Excell Seltzer [place]et 05/20/2021,1:09 AM

## 2021-05-20 NOTE — Progress Notes (Signed)
OT Cancellation Note  Patient Details Name: Dana Coleman MRN: [id number] DOB: Dec 25, 1940   Cancelled Treatment:    Reason Eval/Treat Not Completed: Medical issues which prohibited therapy Communicated with RN. Pt with new onset a fib with RVR overnight and new small PE noted on CT. Pt started on heparin 8/8 this AM, awaiting heparin level check and MD awaiting echo prior to determining intervention plan. Will hold OT at this time and follow for when pt medically appropriate for OOB activities.   Layla Maw 05/20/2021, 7:52 AM

## 2021-05-20 NOTE — Progress Notes (Signed)
ANTICOAGULATION CONSULT NOTE  Pharmacy Consult for heparin Indication: atrial fibrillation and pulmonary embolus  No Known Allergies  Patient Measurements: Height: '5\' 1"'$  (154.9 cm) Weight: 66 kg (145 lb 8.1 oz) IBW/kg (Calculated) : 47.8 Heparin Dosing Weight: 66 kg  Vital Signs: Temp: 98.4 F (36.9 C) (08/08 1530) Temp Source: Oral (08/08 1530) BP: 121/76 (08/08 1530) Pulse Rate: 94 (08/08 1530)  Labs: Recent Labs    05/18/21 [id number] 05/18/21 1725 05/19/21 0443 05/19/21 1405 05/20/21 0053 05/20/21 0110 05/20/21 0253 05/20/21 0744 05/20/21 1729  HGB  --  8.7* 9.6*  --   --  10.4*  --   --   --   HCT  --  26.0* 28.5*  --   --  31.2*  --   --   --   PLT  --  327 311  --   --  344  --   --   --   HEPARINUNFRC  --   --   --   --   --   --   --  0.18* 0.13*  CREATININE 0.74  --   --  0.70 0.76  --   --   --   --   TROPONINIHS  --   --   --   --  837*  --  840*  --   --      Estimated Creatinine Clearance: 49.6 mL/min (by C-G formula based on SCr of 0.76 mg/dL).   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 30 tablet 0 05/04/2021   amLODipine (NORVASC) 10 MG tablet TAKE 1 TABLET BY MOUTH EVERY DAY (Patient taking differently: Take 10 mg by mouth daily.) 90 tablet 3 05/04/2021   aspirin EC 81 MG tablet Take 81 mg by mouth in the morning.  Swallow whole.   05/04/2021   Cholecalciferol (VITAMIN D3) 50 MCG (2000 UT) TABS Take 2,000 Units by mouth in the morning.   05/04/2021   hydrochlorothiazide (HYDRODIURIL) 12.5 MG tablet TAKE 1 TABLET BY MOUTH EVERY DAY (Patient taking differently: Take 12.5 mg by mouth daily.) 90 tablet 3 05/04/2021   JARDIANCE 10 MG TABS tablet TAKE 1 TABLET BY MOUTH EVERY DAY (Patient taking differently: Take 10 mg by mouth daily.) 90 tablet 3 05/04/2021   LANTUS SOLOSTAR 100 UNIT/ML Solostar Pen INJECT 24 UNITS INTO THE SKIN EVERY MORNING. (Patient taking differently: Inject 24 Units into the skin daily.) 3 mL 9 05/04/2021   losartan (COZAAR) 50 MG tablet Take 50 mg by mouth in the morning.   05/04/2021   metFORMIN (GLUCOPHAGE-XR) 500 MG 24 hr tablet TAKE 1 TABLET BY MOUTH TWICE A DAY WITH BREAKFAST AND DINNER (Patient taking differently: Take 500 mg by mouth 2 (two)  times daily.) 180 tablet 0 05/04/2021   nitroGLYCERIN (NITROSTAT) 0.4 MG SL tablet Place 1 tablet (0.4 mg total) under the tongue every 5 (five) minutes as needed for chest pain. (Patient taking differently: Place 0.4 mg under the tongue every 5 (five) minutes x 3 doses as needed for chest pain.) 25 tablet 1 unknown   omeprazole (PRILOSEC) 40 MG capsule Take 40 mg by mouth every morning.   05/04/2021   rosuvastatin (CRESTOR) 20 MG tablet TAKE 1 TABLET BY MOUTH EVERYDAY AT BEDTIME (Patient taking differently: Take 20 mg by mouth at bedtime.) 90 tablet 3 05/03/2021   [EXPIRED] traMADol (ULTRAM) 50 MG tablet Take 1 tablet (50 mg total) by mouth every 6 (six) hours as needed for up to 5 days. 15 tablet 0 05/04/2021   Blood Glucose Monitoring Suppl MISC One touch ultra glucose monitor. Check blood sugar twice a day.      glucose blood (ONE TOUCH ULTRA TEST) test strip CHECK BLOOD SUGAR TWICE DAILY 100 each 1    Insulin Pen Needle (B-D UF III MINI PEN NEEDLES) 31G X 5 MM MISC INJECT INSULIN VIA PEN 6 TIMES DAILY 200 each 3    OneTouch Delica Lancets 99991111 MISC 1  Container by Does not apply route as needed. 100 each PRN     Assessment: 80 yo lady to start heparin for acute PE/afib.  She was not on anticoagulation PTA.    Heparin level remains subtherapeutic, no infusion or bleeding issues noted.   Goal of Therapy:  Heparin level 0.3-0.7 units/ml Monitor platelets by anticoagulation protocol: Yes   Plan:  Heparin 2000 units x1 Increase heparin to 1400 units/h Recheck heparin level in 8h   Arrie Senate, PharmD, [place], [facility] Clinical Pharmacist [phone] Please check AMION for all [facility] Pharmacy numbers 05/20/2021

## 2021-05-20 NOTE — Progress Notes (Signed)
FPTS Interim Progress Note  Call from attending, CTA shows PE with right heart strain.  She is requesting CCM/Pulm consult for further evaluation given increase in oxygen requirement.  CCM made aware.  Carollee Leitz, MD 05/20/2021, 6:31 AM PGY-3, [place] Medicine Service pager [phone]

## 2021-05-20 NOTE — Progress Notes (Signed)
Pt at 8 am this morning while taking pills could not swallow them with pudding, started vomiting, per tele her heart also experienced a 3 second pause during this time. Metoprolol and potassium was changed to IV administration as a result.    This afternoon, the pt went into first degree heart block, vital signs remained stable and she experienced no new symptoms.   Chrisandra Carota, RN 05/20/2021

## 2021-05-20 NOTE — Progress Notes (Signed)
Patient well known to me, will see in a bit.  CT reviewed, small burden PE; think this is more driven by (A) atelectasis (B) third spacing, volume overload.  These will exaggerate any RH strain. Recent thoracic surgery and advanced age make tPA pretty contraindicated.  There are no good central clots for mechanical thrombectomy.  Push diuresis, continue heparin gtt, encourage IS, nutrition.  Erskine Emery MD PCCM

## 2021-05-20 NOTE — Progress Notes (Signed)
FPTS Interim Progress Note  S:Patient seen and evaluated at bedside on nightly rounds.  She was resting comfortably in bed.  She denied having any pain, including chest pain, abdominal pain.  She was not having any dyspnea.  She says that she was sleeping well.  She is on 15 L O2, which is made her breathing much more comfortable.  O: BP 121/76 (BP Location: Right Arm)   Pulse 94   Temp 98.4 F (36.9 C) (Oral)   Resp (!) 25   Ht '5\' 1"'$  (1.549 m)   Wt 66 kg   SpO2 97%   BMI 27.49 kg/m     A/P: B/l acute PE -Continue heparin -On 15 L/40% FiO2, sats > 92% -Encourage incentive spirometry -Appreciate CCM/Pulm recommendations  A fib. With RVR/Hypoxia -IV Metoprolol '5mg'$  q6h -Plan for echo tonight -IV Lasix '40mg'$  BID  Severe protein malnutrition -NPO, but continue TPN feeds  Esopphageal perforation s/p repair -Continue Eraxis, Metronidazole, Ceftriaxone -Tylenol q6h and '50mg'$  q4h PRN -Oxycodone 5-'10mg'$  q6h PRN  Orders reviewed. Labs ordered for am.  Orvis Brill, DO 05/20/2021, 7:44 PM PGY-1, [place] Medicine Service pager [phone]

## 2021-05-20 NOTE — Progress Notes (Addendum)
FPTS Interim Progress Note  S:RN called to report patient now in Afib RVR.  Went to assess patient.  No complaints of chest pain.  Reports breathing slightly improved but still having some shortness of breath. Output of 750cc since Lasix given.    O: BP (!) 129/94   Pulse (!) 139   Temp 98.8 F (37.1 C) (Oral)   Resp (!) 32   Ht '5\' 1"'$  (1.549 m)   Wt 66 kg   SpO2 96%   BMI 27.49 kg/m    Physical Exam:  General: 80 y.o. female in mild respiratory distress Cardio: IRR no m/r/g, LUA PICC Lungs: diminished breath sounds at bases Rt>Lt, clear breath sounds anteriorly, no IWOB on 5L O2 Abdomen: Soft, non-tender to palpation, non-distended, positive bowel sounds Skin: warm and dry Extremities: Bilateral lower extremity edema  A/P: AF with RVR History of Aflutter not on anticoagulation.  Was on Heparin drip but was discontinued on 08/01 for bleeding noted in chest tube.  No bleeding since then.  Hbg stable at 9.6. Spoke with Cardiology and given that she is able to take po, stable Hbg will start Heparin drip and switch to po Metoprolol 25 mg BID.  Can give additional dose if doesn't convert.  Will see in am if remains in Afib CMP,Trops,Mag Heparin drip per pharmacy Metoprolol 25 mg BID, can be crushed   Dyspnea with hypoxia Continues to have shortness of breath and increase O2 requirement.  Wells score 4 and Geneva score 8 both moderate risk for PE. D-Dimer stat, if age adjusted high will obtain CTA chest     Carollee Leitz, MD 05/20/2021, 1:09 AM PGY-3, [place] Service pager [phone]

## 2021-05-20 NOTE — Progress Notes (Signed)
PT Cancellation Note  Patient Details Name: Dana Coleman MRN: [id number] DOB: [birth year]   Cancelled Treatment:    Reason Eval/Treat Not Completed: Medical issues which prohibited therapy.  Pt with new onset a fib with RVR overnight and new small PE noted on CT. Pt started on heparin 8/8 this AM.  Will hold PT at this time and follow for when pt medically appropriate for OOB activities.    [name] 05/20/2021, 8:54 AM

## 2021-05-20 NOTE — Progress Notes (Signed)
   05/20/21 2045  Assess: MEWS Score  Pulse Rate 90  Resp (!) 26  SpO2 96 %  O2 Device HFNC  Heater temperature 88.3 F (31.3 C)  O2 Flow Rate (L/min) 15 L/min  FiO2 (%) 40 %  Assess: MEWS Score  MEWS Temp 0  MEWS Systolic 0  MEWS Pulse 0  MEWS RR 2  MEWS LOC 0  MEWS Score 2  MEWS Score Color Yellow  Assess: if the MEWS score is Yellow or Red  Were vital signs taken at a resting state? Yes  Focused Assessment No change from prior assessment  Early Detection of Sepsis Score *See Row Information* High  MEWS guidelines implemented *See Row Information* No, vital signs rechecked  Treat  MEWS Interventions Administered scheduled meds/treatments  Pain Scale 0-10  Pain Score 0  Document  Patient Outcome Stabilized after interventions

## 2021-05-20 NOTE — Plan of Care (Signed)
  Problem: Clinical Measurements: Goal: Will remain free from infection Outcome: Not Progressing Goal: Respiratory complications will improve Outcome: Not Progressing

## 2021-05-20 NOTE — Progress Notes (Signed)
ANTICOAGULATION CONSULT NOTE - Consult  Pharmacy Consult for heparin Indication: atrial fibrillation and pulmonary embolus  No Known Allergies  Patient Measurements: Height: '5\' 1"'$  (154.9 cm) Weight: 66 kg (145 lb 8.1 oz) IBW/kg (Calculated) : 47.8 Heparin Dosing Weight: 66 kg  Vital Signs: Temp: 98.3 F (36.8 C) (08/08 0734) Temp Source: Oral (08/08 0734) BP: 129/79 (08/08 0734) Pulse Rate: 91 (08/08 0734)  Labs: Recent Labs    05/18/21 [id number] 05/18/21 1725 05/19/21 0443 05/19/21 1405 05/20/21 0053 05/20/21 0110 05/20/21 0253 05/20/21 0744  HGB  --  8.7* 9.6*  --   --  10.4*  --   --   HCT  --  26.0* 28.5*  --   --  31.2*  --   --   PLT  --  327 311  --   --  344  --   --   HEPARINUNFRC  --   --   --   --   --   --   --  0.18*  CREATININE 0.74  --   --  0.70 0.76  --   --   --   TROPONINIHS  --   --   --   --  837*  --  840*  --      Estimated Creatinine Clearance: 49.6 mL/min (by C-G formula based on SCr of 0.76 mg/dL).   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 30 tablet 0 05/04/2021   amLODipine (NORVASC) 10 MG tablet TAKE 1 TABLET BY MOUTH EVERY DAY (Patient taking differently: Take 10 mg by mouth daily.) 90 tablet 3 05/04/2021   aspirin EC 81 MG tablet Take 81 mg by mouth in the morning. Swallow whole.   05/04/2021    Cholecalciferol (VITAMIN D3) 50 MCG (2000 UT) TABS Take 2,000 Units by mouth in the morning.   05/04/2021   hydrochlorothiazide (HYDRODIURIL) 12.5 MG tablet TAKE 1 TABLET BY MOUTH EVERY DAY (Patient taking differently: Take 12.5 mg by mouth daily.) 90 tablet 3 05/04/2021   JARDIANCE 10 MG TABS tablet TAKE 1 TABLET BY MOUTH EVERY DAY (Patient taking differently: Take 10 mg by mouth daily.) 90 tablet 3 05/04/2021   LANTUS SOLOSTAR 100 UNIT/ML Solostar Pen INJECT 24 UNITS INTO THE SKIN EVERY MORNING. (Patient taking differently: Inject 24 Units into the skin daily.) 3 mL 9 05/04/2021   losartan (COZAAR) 50 MG tablet Take 50 mg by mouth in the morning.   05/04/2021   metFORMIN (GLUCOPHAGE-XR) 500 MG 24 hr tablet TAKE 1 TABLET BY MOUTH TWICE A DAY WITH BREAKFAST AND DINNER (Patient taking differently: Take 500 mg by mouth 2 (two) times daily.) 180 tablet 0 05/04/2021   nitroGLYCERIN (NITROSTAT) 0.4 MG SL tablet Place 1  tablet (0.4 mg total) under the tongue every 5 (five) minutes as needed for chest pain. (Patient taking differently: Place 0.4 mg under the tongue every 5 (five) minutes x 3 doses as needed for chest pain.) 25 tablet 1 unknown   omeprazole (PRILOSEC) 40 MG capsule Take 40 mg by mouth every morning.   05/04/2021   rosuvastatin (CRESTOR) 20 MG tablet TAKE 1 TABLET BY MOUTH EVERYDAY AT BEDTIME (Patient taking differently: Take 20 mg by mouth at bedtime.) 90 tablet 3 05/03/2021   [EXPIRED] traMADol (ULTRAM) 50 MG tablet Take 1 tablet (50 mg total) by mouth every 6 (six) hours as needed for up to 5 days. 15 tablet 0 05/04/2021   Blood Glucose Monitoring Suppl MISC One touch ultra glucose monitor. Check blood sugar twice a day.      glucose blood (ONE TOUCH ULTRA TEST) test strip CHECK BLOOD SUGAR TWICE DAILY 100 each 1    Insulin Pen Needle (B-D UF III MINI PEN NEEDLES) 31G X 5 MM MISC INJECT INSULIN VIA PEN 6 TIMES DAILY 200 each 3    OneTouch Delica Lancets [id number] MISC 1 Container by Does not apply route as  needed. 100 each PRN     Assessment: 80 yo lady to start heparin for acute PE/afib.  She was not on anticoagulation PTA.  Hg 9.6, PTLC 311. She did receive sq lovenox 40 mg at 08:59 8/7, and is on daily aspirin 81 mg.  Initial heparin level subtherapeutic at 0.18  Goal of Therapy:  Heparin level 0.3-0.7 units/ml Monitor platelets by anticoagulation protocol: Yes   Plan:  Increase heparin to 1200 units/hr Check heparin level 6-8 hours after start Monitor for bleeding complications   Thank you for allowing [place] to participate in this patients care. Jens Som, PharmD Family medicine RPh phone [phone] 05/20/2021 11:00 AM  Please check [email or web address] for unit-specific pharmacy phone numbers.

## 2021-05-20 NOTE — Progress Notes (Signed)
[address]       [place] [address]             [phone]      15 Days Post-Op Procedure(s) (LRB): THORACOTOMY MAJOR REPAIR PERFORATED ESOPHAGUS (Right) Subjective: Appears ill, alert  but tachypneic  Objective: Vital signs in last 24 hours: Temp:  [97.8 F (36.6 C)-98.8 F (37.1 C)] 98.1 F (36.7 C) (08/08 0200) Pulse Rate:  [87-139] 98 (08/08 0200) Cardiac Rhythm: Atrial fibrillation (08/08 0344) Resp:  [18-42] 33 (08/08 0420) BP: (109-152)/(70-94) 122/70 (08/08 0312) SpO2:  [94 %-100 %] 97 % (08/08 0420)  Hemodynamic parameters for last 24 hours:    Intake/Output from previous day: 08/07 0701 - 08/08 0700 In: 1592.9 [I.V.:1240.4; IV Piggyback:352.6] Out: 1950 [[id number]] Intake/Output this shift: No intake/output data recorded.  General appearance: alert, fatigued, and moderate distress Heart: regular rate and rhythm Lungs: dimin right lower fields>left base Abdomen: soft, non tender Extremities: no edema Wound: incis healing well  Lab Results: Recent Labs    05/19/21 0443 05/20/21 0110  WBC 11.8* 12.9*  HGB 9.6* 10.4*  HCT 28.5* 31.2*  PLT 311 344   BMET:  Recent Labs    05/19/21 1405 05/20/21 0053  NA 134* 132*  K 4.2 3.9  CL 106 104  CO2 19* 20*  GLUCOSE 106* 234*  BUN 25* 25*  CREATININE 0.70 0.76  CALCIUM 8.5* 8.2*    PT/INR: No results for input(s): LABPROT, INR in the last 72 hours. ABG    Component Value Date/Time   PHART 7.417 05/20/2021 0358   HCO3 15.1 (L) 05/20/2021 0358   TCO2 29 05/07/2021 0516   ACIDBASEDEF 8.6 (H) 05/20/2021 0358   O2SAT 90.3 05/20/2021 0358   CBG (last 3)  Recent Labs    05/19/21 2002 05/19/21 2350 05/20/21 0310  GLUCAP 206* 261* 240*    Meds Scheduled Meds:  sodium chloride   Intravenous Once   chlorhexidine  15 mL Mouth Rinse BID   Chlorhexidine Gluconate Cloth  6 each Topical Daily   feeding supplement  237 mL Oral BID BM   furosemide  40 mg Intravenous Q6H    Gerhardt's butt cream  1 application Topical QID   insulin aspart  0-24 Units Subcutaneous Q4H   lidocaine  1 patch Transdermal Q24H   mouth rinse  15 mL Mouth Rinse q12n4p   metoprolol tartrate  25 mg Oral BID   pantoprazole (PROTONIX) IV  40 mg Intravenous QHS   potassium chloride  40 mEq Oral BID   sodium chloride flush  10-40 mL Intracatheter Q12H   Continuous Infusions:  sodium chloride 500 mL (05/19/21 2342)   anidulafungin 100 mg (05/19/21 1549)   cefTRIAXone (ROCEPHIN)  IV 2 g (05/19/21 1557)   heparin 1,000 Units/hr (05/20/21 0157)   metronidazole 500 mg (83/38/25 0539)   TPN CYCLIC-ADULT (ION) 767 mL/hr at 05/19/21 1935   PRN Meds:.sodium chloride, acetaminophen, acetaminophen, fentaNYL (SUBLIMAZE) injection, ondansetron (ZOFRAN) IV, oxyCODONE, sodium chloride, sodium chloride flush  Xrays CT Angio Chest Pulmonary Embolism (PE) W or WO Contrast  Result Date: 05/20/2021 CLINICAL DATA:  Thoracotomy for perforated esophagus 05/05/2021. Prolonged hospitalization, right empyema with chest tube. Respiratory failure. Atrial fibrillation. Elevated D-dimer level. EXAM: CT ANGIOGRAPHY CHEST WITH CONTRAST TECHNIQUE: Multidetector CT imaging of the chest was performed using the standard protocol during bolus administration of intravenous contrast. Multiplanar CT image reconstructions and MIPs were obtained to evaluate the vascular anatomy. CONTRAST:  31m OMNIPAQUE IOHEXOL  350 MG/ML SOLN COMPARISON:  Chest radiograph 05/19/2021 and chest CT from 03/29/2021 FINDINGS: Cardiovascular: Filling defect in the right middle lobe, right lower lobe, and left upper lobe pulmonary arterial tree is compatible with acute pulmonary embolus. Clot burden is moderate. Right ventricular to left ventricular ratio 1.2. Moderate cardiomegaly. Coronary, aortic arch, and branch vessel atherosclerotic vascular disease. Mediastinum/Nodes: Likely reactive scattered mediastinal lymph nodes including a right lower  paratracheal node at 1.2 cm in short axis on image 160 series 7 (formerly 0.5 cm). Lungs/Pleura: Small left pleural effusion. Complex right pleural effusion with loculated elements compatible with empyema. Questionable trace residual locule of pleural gas posteriorly in the right hemithorax on image 37 series 5. There is atelectasis along the margins of the presumed empyema, along with secondary pulmonary lobular interstitial accentuation in the right lung and airway thickening in the right lung compatible with interstitial edema. There is also some consolidation inferiorly in the right lower lobe for example image 95 series 7, possibly from pulmonary hemorrhage or pneumonia. Upper Abdomen: Unremarkable Musculoskeletal: There is evidence of prior right thoracotomy with a staple line noted. Abnormal density along the right chest wall including along the serratus anterior muscle with thickening and likely hematoma in this region. Calcified right lateral breast lesion on image 88 series 5. Right fourth rib osteotomy. Lateral right fifth and sixth rib fractures. Mild thoracic spondylosis. Review of the MIP images confirms the above findings. IMPRESSION: 1. Moderate bilateral acute pulmonary embolus. Positive for acute PE with CT evidence of right heart strain (RV/LV Ratio = 1.2) consistent with at least submassive (intermediate risk) PE. The presence of right heart strain has been associated with an increased risk of morbidity and mortality. Please refer to the "PE Focused" order set in EPIC. 2. Complex right pleural effusion compatible with empyema. 3. Airway thickening and secondary pulmonary lobular interstitial accentuation in the right lung potentially from interstitial edema. Consolidation in the right lower lobe from pulmonary hemorrhage or pneumonia. 4. Prior right thoracotomy, with right fourth rib osteotomy in fractures the right lateral fifth and sixth ribs. Hematoma along the right serratus anterior muscle.  5. Small left pleural effusion. 6. Reactive mediastinal lymph nodes. 7.  Aortic Atherosclerosis (ICD10-I70.0).  Coronary atherosclerosis. Critical Value/emergent results were called by telephone at the time of interpretation on 05/20/2021 at 6:16 am to provider Dr. Andrena Mews, who verbally acknowledged these results. Electronically Signed   By: Van Clines M.D.   On: 05/20/2021 06:23   DG CHEST PORT 1 VIEW  Result Date: 05/19/2021 CLINICAL DATA:  Dyspnea EXAM: PORTABLE CHEST 1 VIEW COMPARISON:  05/17/2021 FINDINGS: Cardiac shadow is enlarged but stable. Aortic calcifications are again seen. Left-sided PICC line is noted and stable. Postsurgical changes are seen in the right lateral chest wall. Multiple rib fractures are noted on the right which appears stable in appearance from previous exam. No pneumothorax is noted. Patchy bibasilar airspace opacity is noted. IMPRESSION: Stable bibasilar airspace opacity. No recurrent pneumothorax is noted. Electronically Signed   By: Inez Catalina M.D.   On: 05/19/2021 22:01    Assessment/Plan: S/P Procedure(s) (LRB): THORACOTOMY MAJOR REPAIR PERFORATED ESOPHAGUS (Right)  1 afeb, s BP 100's-150's , atrial fib. Low 100's, now in sinus with heparin gtt 2 sats ok HFNC, current 10 liters 3 CT chest results noted as per above report- plan as noted per PCCM 4 anion gap met acidosis on ABG 5 BNp >2000- lasix has been ordered 6 Hs Trop most recent 840- rising Ekg Sinus rhythm  with 1st degree A-V block Right bundle branch block Left posterior fascicular block  Bifascicular block  T wave abnormality, consider lateral ischemia Abnormal ECG 7 lactic acid, venous, elev at 2.7 8 mild leukocytosis with rising trend 9 elevated glucose 10 defer decision to tx to ICU to primary svc/PCCM 11 will make npo with deterioration in condition aspiration risk is higher, cont TPN    LOS: 16 days    John Giovanni PA-C Pager [phone] 05/20/2021

## 2021-05-20 NOTE — Progress Notes (Addendum)
The on-call resident discussed the patient respiratory status and an episode of Afib with RVR with me. I have seen and evaluated her, and she seems to have improved some. No chest pain. Breathing a bit better.  Gen: frail and cachectic. In mild respiratory distress  Heart: S1 S2 normal, no mumur.RRR. Respiratory: Air entry diminished GI: Soft, NT/ND, +BS   D. Dimer - 19.5, Troponin: 800+, less than the previous value CTA lungs review: Moderate bilateral acute pulmonary embolus. Positive for acute PE with CT evidence of right heart strain. Also, right lung empyema, + interstitial edema and ?? Pulm hemorrhage vs. PNA.  EKG reviewed: Sinus tachy (new compared to resent EKG) with RBBB (similar to previous)  A/P: Acute hypoxic respiratory failure: Multifactorial (Empyema, new PE, CHF,?? PNA vs Pulm hemorrhage)  Acute Pulmonary embolism:  Now on Heparin drip. Recheck ECHO Consult pulm - I chatted with Dr. Tamala Julian regarding the pulmonary hemorrhage CTA report. He stated that it looks similar to the previous size, and we can safely continue the heparin drip for now. Cardiothoracic surg is following and I messaged Dr. Jadene Pierini as well. Low threshold for CCM transfer.  Afib with RVR/Acute on Chronic CHF with Mid-range ejection fraction and G1DD She is now in sinus rhythm - Continue heparin drip IV Lasix 40 BID while on Heparin drip Metoprolol 25 mg BID Monitor electrolytes closely and replete as needed Repeat ECHO Consult Cards  Empyema/PNA - Continue current antimicrobial regimen.

## 2021-05-21 ENCOUNTER — Inpatient Hospital Stay (HOSPITAL_COMMUNITY): Payer: Medicare Other

## 2021-05-21 DIAGNOSIS — N179 Acute kidney failure, unspecified: Secondary | ICD-10-CM | POA: Diagnosis not present

## 2021-05-21 DIAGNOSIS — I2699 Other pulmonary embolism without acute cor pulmonale: Secondary | ICD-10-CM | POA: Diagnosis not present

## 2021-05-21 DIAGNOSIS — R0609 Other forms of dyspnea: Secondary | ICD-10-CM

## 2021-05-21 DIAGNOSIS — J9601 Acute respiratory failure with hypoxia: Secondary | ICD-10-CM | POA: Diagnosis not present

## 2021-05-21 DIAGNOSIS — I2694 Multiple subsegmental pulmonary emboli without acute cor pulmonale: Secondary | ICD-10-CM

## 2021-05-21 DIAGNOSIS — J9602 Acute respiratory failure with hypercapnia: Secondary | ICD-10-CM | POA: Diagnosis not present

## 2021-05-21 LAB — CBC
HCT: 28.4 % — ABNORMAL LOW (ref 36.0–46.0)
Hemoglobin: 9.5 g/dL — ABNORMAL LOW (ref 12.0–15.0)
MCH: 30.2 pg (ref 26.0–34.0)
MCHC: 33.5 g/dL (ref 30.0–36.0)
MCV: 90.2 fL (ref 80.0–100.0)
Platelets: 357 10*3/uL (ref 150–400)
RBC: 3.15 MIL/uL — ABNORMAL LOW (ref 3.87–5.11)
RDW: 16.7 % — ABNORMAL HIGH (ref 11.5–15.5)
WBC: 13 10*3/uL — ABNORMAL HIGH (ref 4.0–10.5)
nRBC: 0.2 % (ref 0.0–0.2)

## 2021-05-21 LAB — GLUCOSE, CAPILLARY
Glucose-Capillary: 191 mg/dL — ABNORMAL HIGH (ref 70–99)
Glucose-Capillary: 228 mg/dL — ABNORMAL HIGH (ref 70–99)
Glucose-Capillary: 128 mg/dL — ABNORMAL HIGH (ref 70–99)
Glucose-Capillary: 115 mg/dL — ABNORMAL HIGH (ref 70–99)
Glucose-Capillary: 199 mg/dL — ABNORMAL HIGH (ref 70–99)

## 2021-05-21 LAB — ECHOCARDIOGRAM LIMITED
AV Mean grad: 5 mmHg
AV Peak grad: 11 mmHg
Ao pk vel: 1.66 m/s
Calc EF: 35.1 %
Height: 61 in
MV M vel: 4.21 m/s
MV Peak grad: 70.9 mmHg
P 1/2 time: 774 msec
S' Lateral: 3.7 cm
Single Plane A2C EF: 37.3 %
Single Plane A4C EF: 27.8 %
Weight: 2328.06 oz

## 2021-05-21 LAB — COMPREHENSIVE METABOLIC PANEL
ALT: 43 U/L (ref 0–44)
AST: 30 U/L (ref 15–41)
Albumin: 2.2 g/dL — ABNORMAL LOW (ref 3.5–5.0)
Alkaline Phosphatase: 72 U/L (ref 38–126)
Anion gap: 11 (ref 5–15)
BUN: 31 mg/dL — ABNORMAL HIGH (ref 8–23)
CO2: 22 mmol/L (ref 22–32)
Calcium: 8.3 mg/dL — ABNORMAL LOW (ref 8.9–10.3)
Chloride: 101 mmol/L (ref 98–111)
Creatinine, Ser: 0.79 mg/dL (ref 0.44–1.00)
GFR, Estimated: >60 mL/min (ref >60)
Glucose, Bld: 216 mg/dL — ABNORMAL HIGH (ref 70–99)
Potassium: 3.1 mmol/L — ABNORMAL LOW (ref 3.5–5.1)
Sodium: 134 mmol/L — ABNORMAL LOW (ref 135–145)
Total Bilirubin: 0.4 mg/dL (ref 0.3–1.2)
Total Protein: 6.4 g/dL — ABNORMAL LOW (ref 6.5–8.1)

## 2021-05-21 LAB — HEPARIN LEVEL (UNFRACTIONATED)
Heparin Unfractionated: 0.26 IU/mL — ABNORMAL LOW (ref 0.30–0.70)
Heparin Unfractionated: 0.33 IU/mL (ref 0.30–0.70)

## 2021-05-21 MED ORDER — FUROSEMIDE 10 MG/ML IJ SOLN
40.0000 mg | Freq: Once | INTRAMUSCULAR | Status: AC
  Administered 2021-05-21: 40 mg via INTRAVENOUS
  Filled 2021-05-21: qty 4

## 2021-05-21 MED ORDER — POTASSIUM CHLORIDE 10 MEQ/100ML IV SOLN
10.0000 meq | INTRAVENOUS | Status: AC
Start: 2021-05-21 — End: 2021-05-22
  Administered 2021-05-21 (×4): 10 meq via INTRAVENOUS
  Filled 2021-05-21 (×4): qty 100

## 2021-05-21 MED ORDER — PERFLUTREN LIPID MICROSPHERE
1.0000 mL | INTRAVENOUS | Status: AC | PRN
  Administered 2021-05-21: 1 mL via INTRAVENOUS
  Filled 2021-05-21: qty 10

## 2021-05-21 MED ORDER — POTASSIUM CHLORIDE 10 MEQ/50ML IV SOLN
10.0000 meq | INTRAVENOUS | Status: DC
  Filled 2021-05-21 (×2): qty 50

## 2021-05-21 MED ORDER — ACETAMINOPHEN 650 MG RE SUPP
650.0000 mg | RECTAL | Status: DC
  Administered 2021-05-21 – 2021-05-23 (×13): 650 mg via RECTAL
  Filled 2021-05-21 (×13): qty 1

## 2021-05-21 MED ORDER — TRACE MINERALS CU-MN-SE-ZN 300-55-60-3000 MCG/ML IV SOLN
INTRAVENOUS | Status: AC
  Filled 2021-05-21: qty 745.53

## 2021-05-21 MED ORDER — LORAZEPAM 2 MG/ML IJ SOLN
0.2500 mg | Freq: Once | INTRAMUSCULAR | Status: AC
  Administered 2021-05-21: 0.25 mg via INTRAVENOUS
  Filled 2021-05-21: qty 1

## 2021-05-21 MED ORDER — POTASSIUM CHLORIDE 10 MEQ/100ML IV SOLN
10.0000 meq | INTRAVENOUS | Status: AC
Start: 2021-05-21 — End: 2021-05-21
  Administered 2021-05-21 (×4): 10 meq via INTRAVENOUS
  Filled 2021-05-21 (×4): qty 100

## 2021-05-21 MED ORDER — ACETAMINOPHEN 325 MG PO TABS: 650.0000 mg | ORAL_TABLET | ORAL | Status: DC

## 2021-05-21 NOTE — Progress Notes (Signed)
Nutrition Follow-up  DOCUMENTATION CODES:   Severe malnutrition in context of acute illness/injury  INTERVENTION:   Continue TPN to meet 100% estimated nutritional needs -Recommend continuing TPN until diet advanced and pt consuming at least 50% of meals consistently  -Obtain daily weights   Once diet advanced:  Ensure Enlive po BID, each supplement provides 350 kcal and 20 grams of protein Magic cup BID with meals, each supplement provides 290 kcal and 9 grams of protein Feeding assistance with all meals given UE weakness   NUTRITION DIAGNOSIS:   Severe Malnutrition related to acute illness as evidenced by moderate fat depletion, mild fat depletion, moderate muscle depletion.  Ongoing  GOAL:   Patient will meet greater than or equal to 90% of their needs  Addressed via TPN  MONITOR:   Diet advancement, I & O's, Labs, Weight trends, Skin (TPN)  REASON FOR ASSESSMENT:   Rounds    ASSESSMENT:   80 yo female admitted post recent lap cholecystectomy with acute respiratory failure and renal failure with severe lactic acidosis from sepsis and found to have esophageal perforation, empyema. PMH includes DM, HTN, HLD, CAD  7/21 Lap chole 7/23 Admitted, Intubated, Pigtail by CCS in ED with bilious output 7/24 CRRT initiated, R Thoracotomy, repair of esophageal perforation, drainage of empyema and decortication of right lung 7/26 Extubated, TPN initiated 7/31 Stop CRRT 8/01 Barium swallow, no leak 8/02 CL diet started  Patient had episode of nausea with vomiting followed by increased work of breathing this am. New PE shown on imaging. Was made NPO. Prior to this, meal completions charted as 0-5%. Continues on TPN 1575 ml infused over 16 hours to provide 111 g protein and 1803 kcal. Needing multiple runs of potassium given ongoing diuresis. Recommend maximizing protein given new wounds.   Recommend SLP to evaluate patient prior to diet advancement given reports of difficulty  swallowing. RD to add back supplementation once diet advanced. Continue to meet 100% nutrition needs with TPN.   Admission weight: 58 kg  Current weight: 66 kg (taken on 8/3)  UOP: 3450 ml x 24 hrs   Drips: 10 mEq Kcl x 3 runs  Medications: SS novolog Labs: Na 134 (L) K 3.1 (L) CBG 105-240  Diet Order:   Diet Order             Diet NPO time specified  Diet effective now                   EDUCATION NEEDS:   Not appropriate for education at this time  Skin:  Skin Assessment: Skin Integrity Issues: Skin Integrity Issues:: DTI DTI: buttocks Stage I: sacrum Stage II: buttocks  Incisions: abdomen/chest Skin tear: back  Last BM:  8/8  Height:   Ht Readings from Last 1 Encounters:  05/04/21 '5\' 1"'$  (1.549 m)    Weight:   Wt Readings from Last 1 Encounters:  05/15/21 66 kg     BMI:  Body mass index is 27.49 kg/m.  Estimated Nutritional Needs:   Kcal:  1750-2030 kcals  Protein:  90-120 grams  Fluid:  >/= 1.8 L   Kit Brubacher MS, RD, LDN, CNSC Clinical Nutrition Pager listed in [place]

## 2021-05-21 NOTE — Progress Notes (Signed)
At approx 0100 Pt began having c/o increasing WOB/tachypnea/dyspnea and stating "something is not right". Pt RR in mid 30s and breathing does appear labored. 02 sats: 91 on HFNC 15L 40%, lung sounds are diminished on right side.   Respiratory contacted, recommended increasing HFNC to 20L 50%. Pt sats increased to 96% on these settings but did not decrease respiratory rate/WOB.   Carollee Leitz, MD, paged and recommended giving PRN fentanyl and came to bedside to assess. Fentanyl decreased RR to mid 20s but pt still endorses SOB. MD gave orders for '40mg'$  lasix x1 and to contact PCCM if pt shows signs of decompensation. Orders also given to contact family medicine resident for ABG if pt respiratory status worsens.

## 2021-05-21 NOTE — Progress Notes (Signed)
eLink Physician-Brief Progress Note Patient Name: Dana Coleman DOB: [birth year] MRN: [id number]   Date of Service  05/21/2021  HPI/Events of Note  Portable CXR reveals cardiac shadow remains enlarged. Left-sided PICC line is stable. Postsurgical changes in the right chest wall are again noted. Stable bibasilar opacities are seen. Patchy airspace disease is noted within the right lung as well.  eICU Interventions  Plan: BiPAP trial if tolerated.      Intervention Category Major Interventions: Hypoxemia - evaluation and management  Tarquin Welcher Eugene 05/21/2021, 2:47 AM

## 2021-05-21 NOTE — Progress Notes (Signed)
FPTS Interim Night Progress Note  S:Call from RN who noted patient to be more tachypneic with IWOB and satting at 92% on 15L 40% oxygen.  Patient had reported that breathing was more difficult.  RN had spoken to RT and increased O2 to 20 L 50%.   O: Today's Vitals   05/20/21 2045 05/20/21 2057 05/20/21 2300 05/20/21 2347  BP:   134/79   Pulse: 90  82   Resp: (!) '26 20 20   '$ Temp:   98.1 F (36.7 C)   TempSrc:   Oral   SpO2: 96% 95% 93%   Weight:      Height:      PainSc: 0-No pain   Asleep      A/P: Fentanyl 25 mg IV now Lasix 40 mg IV x 1 If continues increase WOB obtain ABG Per PCCM note if decompensates charge nurse to reach out.  Carollee Leitz MD PGY-3, [facility] Family Medicine Service pager [phone]

## 2021-05-21 NOTE — Progress Notes (Signed)
PHARMACY - TOTAL PARENTERAL NUTRITION CONSULT NOTE  Indication: s/p esophageal perforation and repair  Patient Measurements: Height: '5\' 1"'$  (154.9 cm) Weight: 66 kg (145 lb 8.1 oz) IBW/kg (Calculated) : 47.8   Body mass index is 27.49 kg/m.  Assessment:  80 y/o female with recent lap chole for symptomatic cholelithiasis on 7/21. PMH including HTN, HLD, DM, R breast cancer, and CAD. Patient presented on 7/23 with AMS and unresponsiveness - was complaining of abdominal discomfort and then patient became unresponsive and slumped on table.  S/p gall bladder surgery and esophageal perforation-repaired 7/24.  Pharmacy consulted for TPN management.  Glucose / Insulin: DM with A1c 7.4% on Lantus 24/d PTA. CBGs 191-228 on TPN - ?stress response, 78 units in TPN, CBGs 110-144 while off TPN.  Utilized 34 units SSI + 78 units insulin in TPN  Electrolytes: Na 134, K 3.1 (Lasix '40mg'$  IV x 2 doses yesterday and once this AM, received 2 runs yesterday), CoCa 9.74 WNL (no Ca in TPN), others WNL  Renal: septic ATN s/p CRRT (end 7/25) - SCr < 1 and stable, BUN up to 31 (getting Lasix) Hepatic: LFTs significantly increased 8/4 (decreased with holding TPN for 6 hours and Lasix), tbili WNL, albumin up 2.2, prealbumin up 8.9, TG WNL Intake / Output; MIVF:  UOP 3448m with Lasix, net +6.9L (down), chest tube removed, LBM 8/6 -  diarrhea with po intake GI Imaging: - 7/23 CT: no acute abnormalities  GI Surgeries / Procedures:  - 7/21: s/p laparoscopic cholecystectomy and LOA - 7/24: s/p R thoracotomy, repair of esophageal perf with muscle flap, drainage of empyema and decortication of R lung  Central access: PICC 7/26 (double lumen) TPN start date: 05/07/21  Nutritional Goals (per RD rec on 8/2): kCal: 1750-2030 , Protein: 87-116 , Fluid: >=1.8L  Current Nutrition:  TPN Ensure Enlive BID - none charted given Soft diet on 8/6 - very low intake, declining help with feeding >> NPO since 8/8 No feeding tube  recommended due to esophageal injury  Plan:  Continue concentrated cyclic TPN - infuse [id number] over 16 hrs: 53 mL/hr x 1 hr, then 105 mL/hr x 14 hrs, then 53 mL/hr x 1 hr. TPN provides 111g AA, 230g CHO, and 58g ILE for total of 1803 kCal, meeting 100% of patient's needs. Electrolytes in TPN: Na 30 mEq/L (= 47 mEq), K 30 mEq/L (= 47 mEq), Ca 026m/L since 7/30, Mg 11 mEq/L (= 17 mEq), Phos 30 mmol/L (= 47 mmol), maximum acetate Add standard MVI and trace elements to TPN Continue CVTS SSI Q4H and adjust insulin in TPN to 85 units Adjust lytes outside of TPN while diuresing >> KCL x 4 runs per MD Monitor TPN labs on Mon/Thurs >> repeat in AM  Todd Jelinski D. DaMina MarblePharmD, BCPS, BCWakefield/06/2021, 9:20 AM

## 2021-05-21 NOTE — Progress Notes (Signed)
NAME:  Dana Coleman, MRN:  [id number], DOB:  25-Dec-1940, LOS: 11 ADMISSION DATE:  05/04/2021, CONSULTATION DATE:  7/23 REFERRING MD:  Ralene Bathe, CHIEF COMPLAINT:  Dyspnea   History of Present Illness:  80 y/o female admitted for empyema after a lap chole, noted to have esophageal perforation. Underwent repair, course complicated by non-STEMI and acute PE  Pertinent  Medical History  DM2 CAD Hypertension Hyperlipidemia Prior R breast cancer  Significant Hospital Events: Including procedures, antibiotic start and stop dates in addition to other pertinent events   7/23 admitted intubated. Pigtail by [facility]CS in ED -- interestingly seems to have bilious outpt 7/24 Starting CRRT. Abundant GPCs in pleural fluid 7/24 1.  Right thoracotomy 2.  Repair of esophageal perforation with intercostal pedicled muscle flap. 3.  Drainage of empyema and decortication of right lung 7/25 early AM EKG changes, trop leak, probable inferiror NSTEMI, not candidate for intervention.  Echo with mildly reduced LV, large RV ?cause 7/26 extubated 7/29 up out of bed, rising WBC 7/30 blood culture >  7/31 WBC up again, cough with mucus production, chest pain 8/1 elevated WBC, added Vanco, MRSA swab pending, CT stripped from clot by [facility]  8/2 pain control, OOB, passed swallow, started clears  8/3 mobility  8/8 increased O2 needs, PE on imaging, reconsulted.  CTA chest shows filling defects in right middle lobe right lower lobe and left upper lobe, RV/LV ratio 1.2  Interim History / Subjective:  Worsening hypoxia last 24 hours. Now on 25 L / 50% high flow nasal cannula. Did not tolerate BiPAP overnight Diuresed well with Lasix  Objective   Blood pressure 124/85, pulse (!) 102, temperature 98.8 F (37.1 C), temperature source Oral, resp. rate (!) 34, height '5\' 1"'$  (1.549 m), weight 66 kg, SpO2 96 %.    FiO2 (%):  [40 %-50 %] 50 %   Intake/Output Summary (Last 24 hours) at 05/21/2021 [id number] Last data filed at  05/21/2021 0534 Gross per 24 hour  Intake 2556.19 ml  Output 3450 ml  Net -893.81 ml    Filed Weights   05/10/21 0500 05/14/21 0444 05/15/21 0400  Weight: 60.5 kg 65.6 kg 66 kg    Examination: Constitutional: frail elderly woman in mild respiratory distress  Eyes: EOMI, pupils equal Ears, nose, mouth, and throat: MMM, trachea midline Cardiovascular: S1-S2 regular Respiratory: Mild accessory muscle use, decreased breath sounds on right, no rhonchi Gastrointestinal: soft, +BS Skin: No rashes, normal turgor, R chest incision site healing well Neurologic: moves all 4 ext, remains profoundly weak Psychiatric: Anxious affect   Chest x-ray 8/9 independently reviewed shows stable bibasilar opacities with patchy airspace disease in the right lung  Labs show hypokalemia, mild hyponatremia, leukocytosis, stable anemia  Resolved Hospital Problem list   Bleeding from chest tube, small hemothorax, clot and chest tube (improved)  AKI   Assessment & Plan:  Acute hypoxemic respiratory failure- secondary to volume ovelroad, third spacing from poor nutrition w/ pleural effusions, interlobular septal thickening, new PE.  Weakness and poor cough mechanics do not help.   -Systemic tPA is contraindicated due to recent major surgery, distal clots make mechanical thrombectomy unlikely to be helpful, doubt catheter tPA will be helpful here, await echo -Continue IV heparin - Push diuresis - Encourage IS -Tolerating HFNC, did not tolerate BiPAP   Reactive Afib/ RVR  -Lopressor 5 mg every 6 Post op primary esophageal repair for primary esophageal rupture with polymicrobial empyema (05/05/21) -Continue ceftriaxone, Flagyl and anidulafungin -unclear duration  Profound muscular  deconditioning and malnutrition- on TPN due to inability to keep up with nutritional demans NSTEMI earlier in stay  Kara Mead MD. [facility] [place] [facility] Pager : [phone]  If no response to pager ,  please call [phone] until 7 pm After 7:00 pm call Elink  [phone]   05/21/2021

## 2021-05-21 NOTE — Progress Notes (Signed)
[place] Progress Note Patient Name: Dana Coleman DOB: [birth year] MRN: [id number]   Date of Service  05/21/2021  HPI/Events of Note  Patient c/o SOB. RR in mid 30's. Sat - 91% on 15 L/min HFNC. Lung sounds diminished on R side. Given Lasix by resident which is reasonable give LVEF = 45%.  eICU Interventions  Plan: Incentive Spirometry now and Q 1 hour while awake. Portable CXR STAT.     Intervention Category Major Interventions: Other:  Anum Palecek Cornelia Copa 05/21/2021, 2:04 AM

## 2021-05-21 NOTE — Progress Notes (Signed)
Bilateral lower extremity venous duplex has been completed. Preliminary results can be found in CV Proc through chart review.   05/21/21 10:26 AM Dana Coleman RVT

## 2021-05-21 NOTE — Consult Note (Signed)
Reason for Consult:Recurrent A. Fib flutter with depressed LV systolic function and recent non-STEMI Referring Physician:family medicine   Dana Coleman is an 80 y.o. female.  HPI: patient is 80 year old female with past medical history significant for coronary artery disease, history of inferior wall MI inremote past, status post PCI to RCA in 2000 , hypertension, diabetes mellitus, hyperlipidemia, history of carcinoma of the right breast status post mastectomy in the past, had laparoscopic cholecystectomy on 05/02/21, discharged home, developed abdominal pain associated with sudden onset of altered mental status noted to be hypotensive, bradycardic and hypoxic by EMS requiring intubation and subsequently was noted to have a right empyema and perforation of the esophagus requiring emergent thoracotomy and repair perioperatively.  2-D echo done showed RV strain and EKG showed further minimal ST elevation in inferior leads and noted to have elevated high sensitivity troponin high and right heart strain subsequently had CT angiogram yesterday which showed bilateral pulmonary embolism.  During the hospital stay, patient had recurrent episodes of A. Fib flutter and also was noted to have depressed LV systolic function from [id number] to 25-30%.  Cardiologic consultation is called for further management of A. Fib flutter and in view of new depressed LV systolic function. .  Also noted to have normalization of RV systolic function by 2-D echo. Patient presently denies any chest pain.  Complains of occasional shortness of breath.  Denies nausea, vomiting, diaphoresis.  Denies palpitations, lightheadedness or syncope. Patient presently nothing by mouth. 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   Hyperlipidemia    Hypertension    Personal history of radiation therapy 2009    Past Surgical History:  Procedure Laterality Date   BREAST LUMPECTOMY Right 2009   BREAST SURGERY  2009   right lumpectomy   [place] N/A 05/02/2021   Procedure: LAPAROSCOPIC CHOLECYSTECTOMY;  Surgeon: Greer Pickerel, MD;  Location: [facility];  Service: General;  Laterality: N/A;   COLONOSCOPY     LEFT HEART CATHETERIZATION WITH CORONARY ANGIOGRAM N/A 04/20/2014   Procedure: LEFT HEART CATHETERIZATION WITH CORONARY ANGIOGRAM;  Surgeon: Jacolyn Reedy, MD;  Location: [facility] CATH LAB;  Service: Cardiovascular;  Laterality: N/A;   POLYPECTOMY     STENT PLACEMENT VASCULAR ([place] [facility])     THORACOTOMY Right 05/05/2021   Procedure: THORACOTOMY MAJOR REPAIR PERFORATED ESOPHAGUS;  Surgeon: Gaye Pollack, MD;  Location: [facility] OR;  Service: Thoracic;  Laterality: Right;   thyroid     for nodule hemithyroidectomy, benign   THYROIDECTOMY Right 03/2003    Family History  Problem Relation Age of Onset   Heart disease Mother        age 65   Heart disease Father    Cancer Father        unknown   Heart disease Brother    Hypertension Brother    Diabetes Daughter    Stroke Daughter    Cirrhosis Brother    Colon cancer Neg Hx    Colon polyps Neg Hx    Esophageal cancer Neg  Hx    Stomach cancer Neg Hx    Rectal cancer Neg Hx     Social History:  reports that she has never smoked. She has never used smokeless tobacco. She reports that she does not drink alcohol and does not use drugs.  Allergies: No Known Allergies  Medications: I have reviewed the patient's current medications.  Results for orders placed or performed during the hospital encounter of 05/04/21 (from the past 48 hour(s))  Comprehensive metabolic panel     Status: Abnormal   Collection Time: 05/19/21  2:05  PM  Result Value Ref Range   Sodium 134 (L) 135 - 145 mmol/L   Potassium 4.2 3.5 - 5.1 mmol/L   Chloride 106 98 - 111 mmol/L   CO2 19 (L) 22 - 32 mmol/L   Glucose, Bld 106 (H) 70 - 99 mg/dL    Comment: Glucose reference range applies only to samples taken after fasting for at least 8 hours.   BUN 25 (H) 8 - 23 mg/dL   Creatinine, Ser 0.70 0.44 - 1.00 mg/dL   Calcium 8.5 (L) 8.9 - 10.3 mg/dL   Total Protein 6.1 (L) 6.5 - 8.1 g/dL   Albumin 1.4 (L) 3.5 - 5.0 g/dL   AST 32 15 - 41 U/L   ALT 73 (H) 0 - 44 U/L   Alkaline Phosphatase 86 38 - 126 U/L   Total Bilirubin 0.5 0.3 - 1.2 mg/dL   GFR, Estimated >60 >60 mL/min    Comment: (NOTE) Calculated using the CKD-EPI Creatinine Equation (2021)    Anion gap 9 5 - 15    Comment: Performed at [facility], [place] [address], [place], [place] [address]  Glucose, capillary     Status: Abnormal   Collection Time: 05/19/21  3:38 PM  Result Value Ref Range   Glucose-Capillary 109 (H) 70 - 99 mg/dL    Comment: Glucose reference range applies only to samples taken after fasting for at least 8 hours.  Glucose, capillary     Status: Abnormal   Collection Time: 05/19/21  8:02 PM  Result Value Ref Range   Glucose-Capillary 206 (H) 70 - 99 mg/dL    Comment: Glucose reference range applies only to samples taken after fasting for at least 8 hours.  Blood gas, arterial     Status: Abnormal   Collection Time: 05/19/21 10:15 PM  Result Value Ref Range   FIO2 28.00    pH, Arterial 7.423 7.350 - 7.450   pCO2 arterial 26.8 (L) 32.0 - 48.0 mmHg   pO2, Arterial 59.5 (L) 83.0 - 108.0 mmHg   Bicarbonate 17.2 (L) 20.0 - 28.0 mmol/L   Acid-base deficit 6.4 (H) 0.0 - 2.0 mmol/L   O2 Saturation 90.2 %   Patient temperature 37.0    Collection site RIGHT BRACHIAL    Drawn by A LAMBERT    Sample type ARTERIAL DRAW    Allens test (pass/fail) PASS PASS    Comment: Performed at [facility], [place] [address], [place], [place] [address]  Glucose, capillary      Status: Abnormal   Collection Time: 05/19/21 11:50 PM  Result Value Ref Range   Glucose-Capillary 261 (H) 70 - 99 mg/dL    Comment: Glucose reference range applies only to samples taken after fasting for at least 8 hours.  D-dimer, quantitative     Status: Abnormal   Collection Time: 05/20/21 12:53 AM  Result Value Ref Range   D-Dimer, Quant 19.54 (H)  0.00 - 0.50 ug/mL-FEU    Comment: (NOTE) At the manufacturer cut-off value of 0.5 g/mL FEU, this assay has a negative predictive value of 95-100%.This assay is intended for use in conjunction with a clinical pretest probability (PTP) assessment model to exclude pulmonary embolism (PE) and deep venous thrombosis (DVT) in outpatients suspected of PE or DVT. Results should be correlated with clinical presentation. Performed at [facility], [place] [address], [place], [place] [address]   Comprehensive metabolic panel     Status: Abnormal   Collection Time: 05/20/21 12:53 AM  Result Value Ref Range   Sodium 132 (L) 135 - 145 mmol/L   Potassium 3.9 3.5 - 5.1 mmol/L   Chloride 104 98 - 111 mmol/L   CO2 20 (L) 22 - 32 mmol/L   Glucose, Bld 234 (H) 70 - 99 mg/dL    Comment: Glucose reference range applies only to samples taken after fasting for at least 8 hours.   BUN 25 (H) 8 - 23 mg/dL   Creatinine, Ser 0.76 0.44 - 1.00 mg/dL   Calcium 8.2 (L) 8.9 - 10.3 mg/dL   Total Protein 5.7 (L) 6.5 - 8.1 g/dL   Albumin 1.3 (L) 3.5 - 5.0 g/dL   AST 29 15 - 41 U/L   ALT 61 (H) 0 - 44 U/L   Alkaline Phosphatase 83 38 - 126 U/L   Total Bilirubin 0.3 0.3 - 1.2 mg/dL   GFR, Estimated >60 >60 mL/min    Comment: (NOTE) Calculated using the CKD-EPI Creatinine Equation (2021)    Anion gap 8 5 - 15    Comment: Performed at [facility], [place] [address], [place], [place] [address]  Troponin I (High Sensitivity)     Status: Abnormal   Collection Time: 05/20/21 12:53 AM  Result Value Ref Range   Troponin I (High Sensitivity) 837 (HH) <18  ng/L    Comment: CRITICAL RESULT CALLED TO, READ BACK BY AND VERIFIED WITH:  TSabino Snipes RN '@[id number]'$  05/20/21 K. SANDERS  (NOTE) Elevated high sensitivity troponin I (hsTnI) values and significant  changes across serial measurements may suggest ACS but many other  chronic and acute conditions are known to elevate hsTnI results.  Refer to the Links section for chest pain algorithms and additional  guidance. Performed at [facility], [place] [address] [place], [place] [address]   Magnesium     Status: None   Collection Time: 05/20/21  1:10 AM  Result Value Ref Range   Magnesium 2.0 1.7 - 2.4 mg/dL    Comment: Performed at [facility] [address], [place], [place] [address]  Phosphorus     Status: None   Collection Time: 05/20/21  1:10 AM  Result Value Ref Range   Phosphorus 3.3 2.5 - 4.6 mg/dL    Comment: Performed at [facility] [address] [place], [place] [address]  CBC     Status: Abnormal   Collection Time: 05/20/21  1:10 AM  Result Value Ref Range   WBC 12.9 (H) 4.0 - 10.5 K/uL   RBC 3.46 (L) 3.87 - 5.11 MIL/uL   Hemoglobin 10.4 (L) 12.0 - 15.0 g/dL   HCT 31.2 (L) 36.0 - 46.0 %   MCV 90.2 80.0 - 100.0 fL   MCH 30.1 26.0 - 34.0 pg   MCHC 33.3 30.0 - 36.0 g/dL   RDW 16.5 (H) 11.5 - 15.5 %   Platelets 344 150 - 400 K/uL  nRBC 0.0 0.0 - 0.2 %    Comment: Performed at [facility], [place] [address] [place], [place] [address]  Differential     Status: Abnormal   Collection Time: 05/20/21  1:10 AM  Result Value Ref Range   Neutrophils Relative % 87 %   Neutro Abs 11.2 (H) 1.7 - 7.7 K/uL   Lymphocytes Relative 5 %   Lymphs Abs 0.6 (L) 0.7 - 4.0 K/uL   Monocytes Relative 7 %   Monocytes Absolute 0.9 0.1 - 1.0 K/uL   Eosinophils Relative 0 %   Eosinophils Absolute 0.1 0.0 - 0.5 K/uL   Basophils Relative 0 %   Basophils Absolute 0.0 0.0 - 0.1 K/uL   Immature Granulocytes 1 %   Abs Immature Granulocytes 0.15 (H) 0.00 - 0.07 K/uL    Comment:  Performed at [facility] [address] [place], [place] [address]  Triglycerides     Status: None   Collection Time: 05/20/21  1:10 AM  Result Value Ref Range   Triglycerides 105 <150 mg/dL    Comment: Performed at [facility] [address] [place], [place] [address]  Prealbumin     Status: Abnormal   Collection Time: 05/20/21  1:10 AM  Result Value Ref Range   Prealbumin 8.9 (L) 18 - 38 mg/dL    Comment: Performed at [facility] [address], [place], [place] [address]  Brain natriuretic peptide     Status: Abnormal   Collection Time: 05/20/21  1:10 AM  Result Value Ref Range   B Natriuretic Peptide 1,853.0 (H) 0.0 - 100.0 pg/mL    Comment: Performed at [facility] [address] [place], [place] [address]  TSH     Status: None   Collection Time: 05/20/21  1:28 AM  Result Value Ref Range   TSH 1.196 0.350 - 4.500 uIU/mL    Comment: Performed by a 3rd Generation assay with a functional sensitivity of <=0.01 uIU/mL. Performed at [facility], [place] [address], [place], [place] [address]   Troponin I (High Sensitivity)     Status: Abnormal   Collection Time: 05/20/21  2:53 AM  Result Value Ref Range   Troponin I (High Sensitivity) 840 (HH) <18 ng/L    Comment: CRITICAL VALUE NOTED.  VALUE IS CONSISTENT WITH PREVIOUSLY REPORTED AND CALLED VALUE. (NOTE) Elevated high sensitivity troponin I (hsTnI) values and significant  changes across serial measurements may suggest ACS but many other  chronic and acute conditions are known to elevate hsTnI results.  Refer to the Links section for chest pain algorithms and additional  guidance. Performed at [facility], [place] [address] [place], [place] [address]   Glucose, capillary     Status: Abnormal   Collection Time: 05/20/21  3:10 AM  Result Value Ref Range   Glucose-Capillary 240 (H) 70 - 99 mg/dL    Comment: Glucose reference range applies only to samples taken after fasting for at least  8 hours.  Lactic acid, plasma     Status: Abnormal   Collection Time: 05/20/21  3:33 AM  Result Value Ref Range   Lactic Acid, Venous 2.7 (HH) 0.5 - 1.9 mmol/L    Comment: CRITICAL RESULT CALLED TO, READ BACK BY AND VERIFIED WITH:  T. IRBY RN '@[id number]'$  05/20/21 K. SANDERS Performed at [facility], [place] [address], [place],  [address]   Blood gas, arterial     Status:  Abnormal   Collection Time: 05/20/21  3:58 AM  Result Value Ref Range   FIO2 40.00    pH, Arterial 7.417 7.350 - 7.450   pCO2 arterial 23.8 (L) 32.0 - 48.0 mmHg   pO2, Arterial 60.8 (L) 83.0 - 108.0 mmHg   Bicarbonate 15.1 (L) 20.0 - 28.0 mmol/L   Acid-base deficit 8.6 (H) 0.0 - 2.0 mmol/L   O2 Saturation 90.3 %   Patient temperature 36.7    Collection site RIGHT BRACHIAL    Drawn by COLLECTED BY RT     Comment: A LAMBERT   Sample type ARTERIAL DRAW    Allens test (pass/fail) PASS PASS    Comment: Performed at [facility], [place] [address], [place], [place] [address]  Brain natriuretic peptide     Status: Abnormal   Collection Time: 05/20/21  4:53 AM  Result Value Ref Range   B Natriuretic Peptide 2,163.2 (H) 0.0 - 100.0 pg/mL    Comment: Performed at [facility] [address] [place], [place] [address]  Glucose, capillary     Status: Abnormal   Collection Time: 05/20/21  7:32 AM  Result Value Ref Range   Glucose-Capillary 255 (H) 70 - 99 mg/dL    Comment: Glucose reference range applies only to samples taken after fasting for at least 8 hours.  Heparin level (unfractionated)     Status: Abnormal   Collection Time: 05/20/21  7:44 AM  Result Value Ref Range   Heparin Unfractionated 0.18 (L) 0.30 - 0.70 IU/mL    Comment: (NOTE) The clinical reportable range upper limit is being lowered to >1.10 to align with the FDA approved guidance for the current laboratory assay.  If heparin results are below expected values, and patient dosage has  been confirmed, suggest follow up testing of  antithrombin III levels. Performed at [facility], [place] [address], [place], [place] [address]   Lactic acid, plasma     Status: Abnormal   Collection Time: 05/20/21  7:44 AM  Result Value Ref Range   Lactic Acid, Venous 2.2 (HH) 0.5 - 1.9 mmol/L    Comment: CRITICAL VALUE NOTED.  VALUE IS CONSISTENT WITH PREVIOUSLY REPORTED AND CALLED VALUE. Performed at [facility], [place] [address], [place], [place] [address]   Glucose, capillary     Status: Abnormal   Collection Time: 05/20/21 12:18 PM  Result Value Ref Range   Glucose-Capillary 144 (H) 70 - 99 mg/dL    Comment: Glucose reference range applies only to samples taken after fasting for at least 8 hours.  Glucose, capillary     Status: Abnormal   Collection Time: 05/20/21  3:44 PM  Result Value Ref Range   Glucose-Capillary 110 (H) 70 - 99 mg/dL    Comment: Glucose reference range applies only to samples taken after fasting for at least 8 hours.  Heparin level (unfractionated)     Status: Abnormal   Collection Time: 05/20/21  5:29 PM  Result Value Ref Range   Heparin Unfractionated 0.13 (L) 0.30 - 0.70 IU/mL    Comment: (NOTE) The clinical reportable range upper limit is being lowered to >1.10 to align with the FDA approved guidance for the current laboratory assay.  If heparin results are below expected values, and patient dosage has  been confirmed, suggest follow up testing of antithrombin III levels. Performed at [facility], [place] [address] [place], [place] [address]   Glucose, capillary     Status:  Abnormal   Collection Time: 05/20/21  8:12 PM  Result Value Ref Range   Glucose-Capillary 222 (H) 70 - 99 mg/dL    Comment: Glucose reference range applies only to samples taken after fasting for at least 8 hours.  Glucose, capillary     Status: Abnormal   Collection Time: 05/20/21 11:38 PM  Result Value Ref Range   Glucose-Capillary 198 (H) 70 - 99 mg/dL    Comment: Glucose reference range applies only  to samples taken after fasting for at least 8 hours.  CBC     Status: Abnormal   Collection Time: 05/21/21  3:52 AM  Result Value Ref Range   WBC 13.0 (H) 4.0 - 10.5 K/uL   RBC 3.15 (L) 3.87 - 5.11 MIL/uL   Hemoglobin 9.5 (L) 12.0 - 15.0 g/dL   HCT 28.4 (L) 36.0 - 46.0 %   MCV 90.2 80.0 - 100.0 fL   MCH 30.2 26.0 - 34.0 pg   MCHC 33.5 30.0 - 36.0 g/dL   RDW 16.7 (H) 11.5 - 15.5 %   Platelets 357 150 - 400 K/uL   nRBC 0.2 0.0 - 0.2 %    Comment: Performed at [facility], [place] [address], [place],  [address]  Comprehensive metabolic panel     Status: Abnormal   Collection Time: 05/21/21  3:52 AM  Result Value Ref Range   Sodium 134 (L) 135 - 145 mmol/L   Potassium 3.1 (L) 3.5 - 5.1 mmol/L   Chloride 101 98 - 111 mmol/L   CO2 22 22 - 32 mmol/L   Glucose, Bld 216 (H) 70 - 99 mg/dL    Comment: Glucose reference range applies only to samples taken after fasting for at least 8 hours.   BUN 31 (H) 8 - 23 mg/dL   Creatinine, Ser 0.79 0.44 - 1.00 mg/dL   Calcium 8.3 (L) 8.9 - 10.3 mg/dL   Total Protein 6.4 (L) 6.5 - 8.1 g/dL   Albumin 2.2 (L) 3.5 - 5.0 g/dL   AST 30 15 - 41 U/L   ALT 43 0 - 44 U/L   Alkaline Phosphatase 72 38 - 126 U/L   Total Bilirubin 0.4 0.3 - 1.2 mg/dL   GFR, Estimated >60 >60 mL/min    Comment: (NOTE) Calculated using the CKD-EPI Creatinine Equation (2021)    Anion gap 11 5 - 15    Comment: Performed at [facility], [place] [address] [place], [place] [address]  Heparin level (unfractionated)     Status: Abnormal   Collection Time: 05/21/21  3:52 AM  Result Value Ref Range   Heparin Unfractionated 0.26 (L) 0.30 - 0.70 IU/mL    Comment: (NOTE) The clinical reportable range upper limit is being lowered to >1.10 to align with the FDA approved guidance for the current laboratory assay.  If heparin results are below expected values, and patient dosage has  been confirmed, suggest follow up testing of antithrombin III levels. Performed at [facility], [place] [address] [place], [place] [address]   Glucose, capillary     Status: Abnormal   Collection Time: 05/21/21  4:32 AM  Result Value Ref Range   Glucose-Capillary 191 (H) 70 - 99 mg/dL    Comment: Glucose reference range applies only to samples taken after fasting for at least 8 hours.  Glucose, capillary     Status: Abnormal   Collection Time: 05/21/21  7:34 AM  Result Value Ref Range   Glucose-Capillary  228 (H) 70 - 99 mg/dL    Comment: Glucose reference range applies only to samples taken after fasting for at least 8 hours.  Glucose, capillary     Status: Abnormal   Collection Time: 05/21/21 11:48 AM  Result Value Ref Range   Glucose-Capillary 128 (H) 70 - 99 mg/dL    Comment: Glucose reference range applies only to samples taken after fasting for at least 8 hours.  Heparin level (unfractionated)     Status: None   Collection Time: 05/21/21 12:03 PM  Result Value Ref Range   Heparin Unfractionated 0.33 0.30 - 0.70 IU/mL    Comment: (NOTE) The clinical reportable range upper limit is being lowered to >1.10 to align with the FDA approved guidance for the current laboratory assay.  If heparin results are below expected values, and patient dosage has  been confirmed, suggest follow up testing of antithrombin III levels. Performed at [facility], [place] [address], [place], [place] [address]     CT Angio Chest Pulmonary Embolism (PE) W or WO Contrast  Result Date: 05/20/2021 CLINICAL DATA:  Thoracotomy for perforated esophagus 05/05/2021. Prolonged hospitalization, right empyema with chest tube. Respiratory failure. Atrial fibrillation. Elevated D-dimer level. EXAM: CT ANGIOGRAPHY CHEST WITH CONTRAST TECHNIQUE: Multidetector CT imaging of the chest was performed using the standard protocol during bolus administration of intravenous contrast. Multiplanar CT image reconstructions and MIPs were obtained to evaluate the vascular anatomy. CONTRAST:  14m OMNIPAQUE  IOHEXOL 350 MG/ML SOLN COMPARISON:  Chest radiograph 05/19/2021 and chest CT from 03/29/2021 FINDINGS: Cardiovascular: Filling defect in the right middle lobe, right lower lobe, and left upper lobe pulmonary arterial tree is compatible with acute pulmonary embolus. Clot burden is moderate. Right ventricular to left ventricular ratio 1.2. Moderate cardiomegaly. Coronary, aortic arch, and branch vessel atherosclerotic vascular disease. Mediastinum/Nodes: Likely reactive scattered mediastinal lymph nodes including a right lower paratracheal node at 1.2 cm in short axis on image 160 series 7 (formerly 0.5 cm). Lungs/Pleura: Small left pleural effusion. Complex right pleural effusion with loculated elements compatible with empyema. Questionable trace residual locule of pleural gas posteriorly in the right hemithorax on image 37 series 5. There is atelectasis along the margins of the presumed empyema, along with secondary pulmonary lobular interstitial accentuation in the right lung and airway thickening in the right lung compatible with interstitial edema. There is also some consolidation inferiorly in the right lower lobe for example image 95 series 7, possibly from pulmonary hemorrhage or pneumonia. Upper Abdomen: Unremarkable Musculoskeletal: There is evidence of prior right thoracotomy with a staple line noted. Abnormal density along the right chest wall including along the serratus anterior muscle with thickening and likely hematoma in this region. Calcified right lateral breast lesion on image 88 series 5. Right fourth rib osteotomy. Lateral right fifth and sixth rib fractures. Mild thoracic spondylosis. Review of the MIP images confirms the above findings. IMPRESSION: 1. Moderate bilateral acute pulmonary embolus. Positive for acute PE with CT evidence of right heart strain (RV/LV Ratio = 1.2) consistent with at least submassive (intermediate risk) PE. The presence of right heart strain has been associated with an  increased risk of morbidity and mortality. Please refer to the "PE Focused" order set in EPIC. 2. Complex right pleural effusion compatible with empyema. 3. Airway thickening and secondary pulmonary lobular interstitial accentuation in the right lung potentially from interstitial edema. Consolidation in the right lower lobe from pulmonary hemorrhage or pneumonia. 4. Prior right thoracotomy, with right fourth rib osteotomy  in fractures the right lateral fifth and sixth ribs. Hematoma along the right serratus anterior muscle. 5. Small left pleural effusion. 6. Reactive mediastinal lymph nodes. 7.  Aortic Atherosclerosis (ICD10-I70.0).  Coronary atherosclerosis. Critical Value/emergent results were called by telephone at the time of interpretation on 05/20/2021 at 6:16 am to provider Dr. Andrena Mews, who verbally acknowledged these results. Electronically Signed   By: Van Clines M.D.   On: 05/20/2021 06:23   DG CHEST PORT 1 VIEW  Result Date: 05/21/2021 CLINICAL DATA:  Shortness of breath, known pulmonary emboli. EXAM: PORTABLE CHEST 1 VIEW COMPARISON:  05/19/2021 FINDINGS: Cardiac shadow remains enlarged. Left-sided PICC line is stable. Postsurgical changes in the right chest wall are again noted. Stable bibasilar opacities are seen. Patchy airspace disease is noted within the right lung as well. IMPRESSION: Stable appearance of the chest when compared with the prior exam. Electronically Signed   By: Inez Catalina M.D.   On: 05/21/2021 02:24   DG CHEST PORT 1 VIEW  Result Date: 05/19/2021 CLINICAL DATA:  Dyspnea EXAM: PORTABLE CHEST 1 VIEW COMPARISON:  05/17/2021 FINDINGS: Cardiac shadow is enlarged but stable. Aortic calcifications are again seen. Left-sided PICC line is noted and stable. Postsurgical changes are seen in the right lateral chest wall. Multiple rib fractures are noted on the right which appears stable in appearance from previous exam. No pneumothorax is noted. Patchy bibasilar airspace  opacity is noted. IMPRESSION: Stable bibasilar airspace opacity. No recurrent pneumothorax is noted. Electronically Signed   By: Inez Catalina M.D.   On: 05/19/2021 22:01   VAS [place] LOWER EXTREMITY VENOUS (DVT)  Result Date: 05/21/2021  Lower Venous DVT Study Patient Name:  Dana Coleman  Date of Exam:   05/21/2021 Medical Rec #: [id number]           Accession #:    [id number] Date of Birth: 1941-01-02            Patient Gender: F Patient Age:   56 years Exam Location[facility]  [facility] Procedure:      VAS [place] LOWER EXTREMITY VENOUS (DVT) Referring Phys: [facility] --------------------------------------------------------------------------------  Indications: Pulmonary embolism.  Risk Factors: None identified. Limitations: Poor ultrasound/tissue interface. Comparison Study: No prior studies. Performing Technologist: Oliver Hum RVT  Examination Guidelines: A complete evaluation includes B-mode imaging, spectral Doppler, color Doppler, and power Doppler as needed of all accessible portions of each vessel. Bilateral testing is considered an integral part of a complete examination. Limited examinations for reoccurring indications may be performed as noted. The reflux portion of the exam is performed with the patient in reverse Trendelenburg.  +---------+---------------+---------+-----------+----------+--------------+ RIGHT    CompressibilityPhasicitySpontaneityPropertiesThrombus Aging +---------+---------------+---------+-----------+----------+--------------+ CFV      Full           Yes      Yes                                 +---------+---------------+---------+-----------+----------+--------------+ SFJ      Full                                                        +---------+---------------+---------+-----------+----------+--------------+ FV Prox  Full                                                        +---------+---------------+---------+-----------+----------+--------------+  FV Mid   Full                                                        +---------+---------------+---------+-----------+----------+--------------+ FV DistalFull                                                        +---------+---------------+---------+-----------+----------+--------------+ PFV      Full                                                        +---------+---------------+---------+-----------+----------+--------------+ POP      Full           Yes      Yes                                 +---------+---------------+---------+-----------+----------+--------------+ PTV      Full                                                        +---------+---------------+---------+-----------+----------+--------------+ PERO     Full                                                        +---------+---------------+---------+-----------+----------+--------------+   +---------+---------------+---------+-----------+----------+--------------+ LEFT     CompressibilityPhasicitySpontaneityPropertiesThrombus Aging +---------+---------------+---------+-----------+----------+--------------+ CFV      Full           Yes      Yes                                 +---------+---------------+---------+-----------+----------+--------------+ SFJ      Full                                                        +---------+---------------+---------+-----------+----------+--------------+ FV Prox  Full                                                        +---------+---------------+---------+-----------+----------+--------------+ FV Mid                  Yes      Yes                                 +---------+---------------+---------+-----------+----------+--------------+  FV DistalFull                                                        +---------+---------------+---------+-----------+----------+--------------+ PFV      Full                                                         +---------+---------------+---------+-----------+----------+--------------+ POP      Full           Yes      Yes                                 +---------+---------------+---------+-----------+----------+--------------+ PTV      Full                                                        +---------+---------------+---------+-----------+----------+--------------+ PERO     Full                                                        +---------+---------------+---------+-----------+----------+--------------+    Summary: RIGHT: - There is no evidence of deep vein thrombosis in the lower extremity. However, portions of this examination were limited- see technologist comments above.  - No cystic structure found in the popliteal fossa.  LEFT: - There is no evidence of deep vein thrombosis in the lower extremity. However, portions of this examination were limited- see technologist comments above.  - No cystic structure found in the popliteal fossa.  *See table(s) above for measurements and observations.    Preliminary    ECHOCARDIOGRAM LIMITED  Result Date: 05/21/2021    ECHOCARDIOGRAM LIMITED REPORT   Patient Name:   Dana Coleman Date of Exam: 05/21/2021 Medical Rec #:  [id number]          Height:       61.0 in Accession #:    [id number]         Weight:       145.5 lb Date of Birth:  [birth year]           BSA:          1.650 m Patient Age:    43 years           BP:           124/77 mmHg Patient Gender: F                  HR:           100 bpm. Exam Location:  Inpatient Procedure: Limited Echo, Cardiac Doppler, Limited Color Doppler and Intracardiac            Opacification Agent  STAT ECHO Reported to: Dr Marlou Porch on 05/20/2021 8:20:00 AM                  Stat order. Indications:    Dyspnea  History:        Patient has prior history of Echocardiogram examinations, most                 recent 05/06/2021. Previous Myocardial Infarction; Risk                  Factors:Diabetes, Dyslipidemia and Hypertension.  Sonographer:    Georganna Skeans RDCS Referring Phys: [id number] Tawanna Solo T ENIOLA IMPRESSIONS  1. Left ventricular ejection fraction, by estimation, is 25 to 30%. The left ventricle has severely decreased function. The left ventricle demonstrates regional wall motion abnormalities (see scoring diagram/findings for description). There is mild left  ventricular hypertrophy.  2. Right ventricular systolic function is normal. The right ventricular size is normal. There is mildly elevated pulmonary artery systolic pressure. The estimated right ventricular systolic pressure is [id number] mmHg.  3. The mitral valve is normal in structure. Mild mitral valve regurgitation. No evidence of mitral stenosis.  4. Tricuspid valve regurgitation is moderate.  5. The aortic valve is tricuspid. Aortic valve regurgitation is not visualized. Mild aortic valve sclerosis is present, with no evidence of aortic valve stenosis.  6. The inferior vena cava is normal in size with greater than 50% respiratory variability, suggesting right atrial pressure of 3 mmHg. Comparison(s): Prior images unable to be directly viewed, comparison made by report only. The left ventricular function is worsened. FINDINGS  Left Ventricle: Left ventricular ejection fraction, by estimation, is 25 to 30%. The left ventricle has severely decreased function. The left ventricle demonstrates regional wall motion abnormalities. Definity contrast agent was given IV to delineate the left ventricular endocardial borders. The left ventricular internal cavity size was normal in size. There is mild left ventricular hypertrophy.  LV Wall Scoring: The mid and distal anterior septum, entire apex, mid and distal inferior wall, and mid inferoseptal segment are akinetic. Right Ventricle: The right ventricular size is normal. No increase in right ventricular wall thickness. Right ventricular systolic function is normal. There is mildly elevated  pulmonary artery systolic pressure. The tricuspid regurgitant velocity is 2.94  m/s, and with an assumed right atrial pressure of 8 mmHg, the estimated right ventricular systolic pressure is [id number] mmHg. Left Atrium: Left atrial size was normal in size. Right Atrium: Right atrial size was normal in size. Pericardium: There is no evidence of pericardial effusion. Mitral Valve: The mitral valve is normal in structure. Mild mitral valve regurgitation. No evidence of mitral valve stenosis. Tricuspid Valve: The tricuspid valve is normal in structure. Tricuspid valve regurgitation is moderate . No evidence of tricuspid stenosis. Aortic Valve: The aortic valve is tricuspid. Aortic valve regurgitation is not visualized. Aortic regurgitation PHT measures 774 msec. Mild aortic valve sclerosis is present, with no evidence of aortic valve stenosis. Aortic valve mean gradient measures 5.0 mmHg. Aortic valve peak gradient measures 11.0 mmHg. Pulmonic Valve: The pulmonic valve was normal in structure. Pulmonic valve regurgitation is trivial. No evidence of pulmonic stenosis. Aorta: The aortic root is normal in size and structure. Venous: The inferior vena cava is normal in size with greater than 50% respiratory variability, suggesting right atrial pressure of 3 mmHg. IAS/Shunts: No atrial level shunt detected by color flow Doppler. LEFT VENTRICLE PLAX 2D LVIDd:         4.70 cm LVIDs:  3.70 cm LV PW:         0.70 cm LV IVS:        1.30 cm LVOT diam:     2.10 cm LVOT Area:     3.46 cm  LV Volumes (MOD) LV vol d, MOD A2C: 81.4 ml LV vol d, MOD A4C: 65.0 ml LV vol s, MOD A2C: 51.0 ml LV vol s, MOD A4C: 46.9 ml LV SV MOD A2C:     30.4 ml LV SV MOD A4C:     65.0 ml LV SV MOD BP:      26.5 ml RIGHT VENTRICLE RV Basal diam:  3.50 cm RV Mid diam:    2.10 cm RV S prime:     7.18 cm/s TAPSE (M-mode): 0.7 cm LEFT ATRIUM             Index       RIGHT ATRIUM           Index LA Vol (A2C):   53.5 ml 32.43 ml/m RA Area:     12.70 cm LA Vol  (A4C):   30.9 ml 18.73 ml/m RA Volume:   28.70 ml  17.39 ml/m LA Biplane Vol: 41.8 ml 25.33 ml/m  AORTIC VALVE               PULMONIC VALVE AV Vmax:      166.00 cm/s  PV Vmax:       1.02 m/s AV Vmean:     101.000 cm/s PV Vmean:      68.100 cm/s AV VTI:       0.223 m      PV VTI:        0.174 m AV Peak Grad: 11.0 mmHg    PV Peak grad:  4.2 mmHg AV Mean Grad: 5.0 mmHg     PV Mean grad:  2.0 mmHg AI PHT:       774 msec  AORTA Ao Root diam: 3.80 cm Ao Asc diam:  3.80 cm MR Peak grad: 70.9 mmHg   TRICUSPID VALVE MR Vmax:      421.00 cm/s TR Peak grad:   34.6 mmHg                           TR Vmax:        294.00 cm/s                            SHUNTS                           Systemic Diam: 2.10 cm Candee Furbish MD Electronically signed by Candee Furbish MD Signature Date/Time: 05/21/2021/11:06:49 AM    Final     Review of Systems  Constitutional:  Negative for diaphoresis and fever.  HENT:  Negative for sore throat.   Respiratory:  Positive for shortness of breath.   Cardiovascular:  Negative for chest pain, palpitations and leg swelling.  Genitourinary:  Negative for difficulty urinating.  Neurological:  Negative for dizziness.  Blood pressure 116/80, pulse 95, temperature 98.4 F (36.9 C), temperature source Oral, resp. rate (!) 22, height '5\' 1"'$  (1.549 m), weight 66 kg, SpO2 98 %. Physical Exam Constitutional:      Appearance: Normal appearance.  HENT:     Head: Normocephalic.  Eyes:     General: No scleral icterus.    Extraocular Movements:  Extraocular movements intact.     Conjunctiva/sclera: Conjunctivae normal.  Neck:     Vascular: No carotid bruit.  Cardiovascular:     Rate and Rhythm: Normal rate and regular rhythm.     Heart sounds: Murmur heard.  Pulmonary:     Comments: Decreased breath sounds at bases Abdominal:     General: Abdomen is flat. There is no distension.     Palpations: Abdomen is soft. There is no mass.     Tenderness: There is no abdominal tenderness.  Musculoskeletal:      Cervical back: Neck supple.     Comments: No clubbing, cyanosis or edema  Neurological:     General: No focal deficit present.     Mental Status: She is alert and oriented to person, place, and time.    Assessment/Plan: Status post recent laparoscopic cholecystectomy, complicated by right empyema and esophageal perforation requiring emergency thoracotomy and drainage of empyema and decortication.  Status post recent perioperative non-STEMI. CAD, history of inferior wall MI and remote past. Ischemic cardiomyopathy. Recurrent A. Fib flutter with controlled ventricular response. Status post recent bilateral pulmonary embolism with RV strain which has resolved.  Hypertension. Diabetes mellitus. 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 Plan Continue present management. Agree with IV heparin and IV Lopressor for rate control.for now Will add Entresto /carvedilol/Aldactone/SGLT2 inhibitor once able to take by mouth, and blood pressure tolerates. Will consider amiodarone by mouth if continues to have recurrent episodes of A. Fib flutter Check EKG in a.m.   Charolette Forward 05/21/2021, 2:02 PM

## 2021-05-21 NOTE — Progress Notes (Signed)
ANTICOAGULATION CONSULT NOTE  Pharmacy Consult for heparin Indication: atrial fibrillation and pulmonary embolus  No Known Allergies  Patient Measurements: Height: '5\' 1"'$  (154.9 cm) Weight: 66 kg (145 lb 8.1 oz) IBW/kg (Calculated) : 47.8 Heparin Dosing Weight: 66 kg  Vital Signs: Temp: 98.4 F (36.9 C) (08/09 1145) Temp Source: Oral (08/09 1145) BP: 116/80 (08/09 1300) Pulse Rate: 95 (08/09 1145)  Labs: Recent Labs    05/19/21 0443 05/19/21 1405 05/20/21 0053 05/20/21 0110 05/20/21 0253 05/20/21 0744 05/20/21 1729 05/21/21 0352 05/21/21 1203  HGB 9.6*  --   --  10.4*  --   --   --  9.5*  --   HCT 28.5*  --   --  31.2*  --   --   --  28.4*  --   PLT 311  --   --  344  --   --   --  357  --   HEPARINUNFRC  --   --   --   --   --    < > 0.13* 0.26* 0.33  CREATININE  --  0.70 0.76  --   --   --   --  0.79  --   TROPONINIHS  --   --  837*  --  840*  --   --   --   --    < > = values in this interval not displayed.     Estimated Creatinine Clearance: 49.6 mL/min (by C-G formula based on SCr of 0.79 mg/dL).   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 30 tablet 0 05/04/2021   amLODipine (NORVASC) 10 MG tablet TAKE 1 TABLET BY MOUTH EVERY DAY (Patient taking differently: Take 10 mg by mouth daily.) 90 tablet 3 05/04/2021   aspirin EC 81  MG tablet Take 81 mg by mouth in the morning. Swallow whole.   05/04/2021   Cholecalciferol (VITAMIN D3) 50 MCG (2000 UT) TABS Take 2,000 Units by mouth in the morning.   05/04/2021   hydrochlorothiazide (HYDRODIURIL) 12.5 MG tablet TAKE 1 TABLET BY MOUTH EVERY DAY (Patient taking differently: Take 12.5 mg by mouth daily.) 90 tablet 3 05/04/2021   JARDIANCE 10 MG TABS tablet TAKE 1 TABLET BY MOUTH EVERY DAY (Patient taking differently: Take 10 mg by mouth daily.) 90 tablet 3 05/04/2021   LANTUS SOLOSTAR 100 UNIT/ML Solostar Pen INJECT 24 UNITS INTO THE SKIN EVERY MORNING. (Patient taking differently: Inject 24 Units into the skin daily.) 3 mL 9 05/04/2021   losartan (COZAAR) 50 MG tablet Take 50 mg by mouth in the morning.   05/04/2021   metFORMIN (GLUCOPHAGE-XR) 500 MG 24 hr tablet TAKE 1 TABLET BY MOUTH TWICE A DAY WITH BREAKFAST AND DINNER (  Patient taking differently: Take 500 mg by mouth 2 (two) times daily.) 180 tablet 0 05/04/2021   nitroGLYCERIN (NITROSTAT) 0.4 MG SL tablet Place 1 tablet (0.4 mg total) under the tongue every 5 (five) minutes as needed for chest pain. (Patient taking differently: Place 0.4 mg under the tongue every 5 (five) minutes x 3 doses as needed for chest pain.) 25 tablet 1 unknown   omeprazole (PRILOSEC) 40 MG capsule Take 40 mg by mouth every morning.   05/04/2021   rosuvastatin (CRESTOR) 20 MG tablet TAKE 1 TABLET BY MOUTH EVERYDAY AT BEDTIME (Patient taking differently: Take 20 mg by mouth at bedtime.) 90 tablet 3 05/03/2021   [EXPIRED] traMADol (ULTRAM) 50 MG tablet Take 1 tablet (50 mg total) by mouth every 6 (six) hours as needed for up to 5 days. 15 tablet 0 05/04/2021   Blood Glucose Monitoring Suppl MISC One touch ultra glucose monitor. Check blood sugar twice a day.      glucose blood (ONE TOUCH ULTRA TEST) test strip CHECK BLOOD SUGAR TWICE DAILY 100 each 1    Insulin Pen Needle (B-D UF III MINI PEN NEEDLES) 31G X 5 MM MISC INJECT INSULIN VIA PEN 6 TIMES DAILY 200 each 3     OneTouch Delica Lancets [id number] MISC 1 Container by Does not apply route as needed. 100 each PRN     Assessment: 80 yo lady to start heparin for acute PE/afib.  She was not on anticoagulation PTA.  She remains on TPN. -heparin level = 0.33 and on the low end of goal -hg= 9.5  Goal of Therapy:  Heparin level 0.3-0.7 units/ml Monitor platelets by anticoagulation protocol: Yes   Plan:  -Increase heparin to 1550 units/hr -Daily heparin level and CBC  Hildred Laser, PharmD Clinical Pharmacist **Pharmacist phone directory can now be found on [email or web address] ([email or web address]).  Listed under [place].

## 2021-05-21 NOTE — Progress Notes (Signed)
Pt placed on Bipap per MD order.  Pt tolerating well at this time.

## 2021-05-21 NOTE — Progress Notes (Signed)
Physical Therapy Treatment Patient Details Name: Dana Coleman MRN: [id number] DOB: Apr 13, 1941 Today's Date: 05/21/2021    History of Present Illness Pt is a 80 yo female admitted on 05/04/21 for AMS. HX of cholecystectomy on 7/21.  Intubated upon admission, 7/24 Started CRRT, Esophageal perforation with septic shock post flap repair, drainage of empyema and decortication of R lung with chest tube placed.  7/25 EKG changes and probable nstemi. Extubated 7/26. CT removed 8/4. Pt with new PE with R heart strain and increased O2 HFNC requirements 8/8. PMH includes: R breast CA, DM, CAD, HTN.    PT Comments    Pt received in supine, agreeable to limited supine exercises, pt limited due to increased work of breathing and increased O2 requirements this date. Pt with good participation as able and reports "very fatigued" (~7/10 modified RPE) after limited LE supine exercises. Pt did not tolerate most UE exercises this date due to fatigue. Pt continues to benefit from PT services to progress toward functional mobility goals. Continue to recommend CIR, pending progress, may need PT goal reassessment next session due to new PE/O2 requirements.   Follow Up Recommendations  CIR     Equipment Recommendations  None recommended by PT    Recommendations for Other Services       Precautions / Restrictions Precautions Precautions: Fall Precaution Comments: watch RR, HFNC/increased O2 requirements s/p PE Restrictions Weight Bearing Restrictions: No    Mobility  Bed Mobility    General bed mobility comments: RN declined OOB session due to pt increased WOB/O2 saturation needs; pt RR up to 30 rpm with supine exercises    Transfers    General transfer comment: Deferred secondary to medical status/increased WOB           Cognition Arousal/Alertness: Awake/alert Behavior During Therapy: Flat affect Overall Cognitive Status: Impaired/Different from baseline Area of Impairment: Problem  solving;Following commands;Awareness            Following Commands: Follows one step commands consistently;Follows one step commands with increased time   Awareness: Emergent Problem Solving: Slow processing;Requires verbal cues General Comments: pt with slow processing, requires max encouragement.      Exercises General Exercises - Upper Extremity Shoulder Flexion: AAROM;Both;5 reps;Supine General Exercises - Lower Extremity Ankle Circles/Pumps: AROM;Both;15 reps;Supine Gluteal Sets: AROM;Both;10 reps;Supine Short Arc Quad: AROM;Both;10 reps;Supine Heel Slides: AROM;AAROM;Both;10 reps;Supine Hip ABduction/ADduction: AAROM;Both;10 reps;Supine (AROM for partial ROM, AA for improved ROM)    General Comments General comments (skin integrity, edema, etc.): BP 118/77 (90) prior to session, then 118/78 (90) post session both taken supine; RR 23-30 rpm with supine exercises; HR 85-87 bpm and SpO2 96-97% on 20L HF [place] and 40% FiO2      Pertinent Vitals/Pain Pain Assessment: Faces Faces Pain Scale: Hurts little more Pain Location: R flank Pain Descriptors / Indicators: Discomfort;Grimacing Pain Intervention(s): Limited activity within patient's tolerance;Monitored during session;Premedicated before session (encouraged semi-sidelying position to offload pressure but pt asking to do this later 2/2 fatigue, NT notified)     PT Goals (current goals can now be found in the care plan section) Acute Rehab PT Goals Patient Stated Goal: "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." PT Goal Formulation: With patient Time For Goal Achievement: 05/31/21 Progress towards PT goals: Not progressing toward goals - comment;PT to reassess next treatment (new O2 requirements/PE, needs goals reassessed)    Frequency    Min 3X/week      PT Plan Current plan remains appropriate  AM-PAC PT "6 Clicks" Mobility   Outcome Measure  Help needed turning from your back to your side while in a  flat bed without using bedrails?: A Lot Help needed moving from lying on your back to sitting on the side of a flat bed without using bedrails?: A Lot Help needed moving to and from a bed to a chair (including a wheelchair)?: Total Help needed standing up from a chair using your arms (e.g., wheelchair or bedside chair)?: Total Help needed to walk in hospital room?: Total Help needed climbing 3-5 steps with a railing? : Total 6 Click Score: 8    End of Session Equipment Utilized During Treatment: Oxygen (HFNC) Activity Tolerance: Patient limited by fatigue;Treatment limited secondary to medical complications (Comment);Other (comment) (increased RR/WOB, defer OOB mobility) Patient left: in bed;with call bell/phone within reach;with family/visitor present;with SCD's reapplied (heels floated) Nurse Communication: Mobility status;Other (comment) (she could benefit from semi-sidelying position once rested in a few mins to offload pressure, pt defer during session) PT Visit Diagnosis: Other abnormalities of gait and mobility (R26.89);Muscle weakness (generalized) (M62.81);Difficulty in walking, not elsewhere classified (R26.2);Pain Pain - Right/Left: Right Pain - part of body:  (flank)     Time: [id number] PT Time Calculation (min) (ACUTE ONLY): 23 min  Charges:  $Therapeutic Exercise: 23-37 mins                     Jakolby Sedivy P., PTA Acute Rehabilitation Services Pager: [phone] Office: [place] 05/21/2021, 4:31 PM

## 2021-05-21 NOTE — Care Management Important Message (Signed)
Important Message  Patient Details  Name: CLEOTA DONAWAY MRN: [id number] Date of Birth: 05-Nov-1940   Medicare Important Message Given:  Yes     Shelda Altes 05/21/2021, 9:50 AM

## 2021-05-21 NOTE — Progress Notes (Signed)
*  PRELIMINARY RESULTS* Echocardiogram 2D Echocardiogram limited with Definity has been performed.  Luisa Hart RDCS 05/21/2021, 8:12 AM

## 2021-05-21 NOTE — Progress Notes (Signed)
FPTS Brief Progress Note  S:Patient reports that she is having some difficulty with getting to sleep tonight though she is unsure why, does state that it is hard to sleep in the hospital. Is not having any pain currently and no other concerns.   O: BP 122/76 (BP Location: Right Arm)   Pulse 85   Temp 97.8 F (36.6 C) (Oral)   Resp 19   Ht '5\' 1"'$  (1.549 m)   Wt 66 kg   SpO2 98%   BMI 27.49 kg/m   General: frail and cachectic, in mild respiratory distress Cardiovascular: on telemetry, irregularly irregular rhythm with HR in the 80s-90s Respiratory: mild respiratory distress, shallow breathing, does not converse in long sentences but still able to converse, decreased breath sounds in bases. Currently on 25L HFNC.  A/P: Recurrent afib with controlled ventricular response - Orders reviewed. Labs for AM are ordered - Continuing current management with IV heparin and Lopressor. Once able to take oral medications can consider amiodarone and other medication recommendations as noted in consult note from cardiology. - Will closely monitor overnight  Acute pulmonary embolism - Continuing heparin drip - Monitoring respiratory status - BiPap if worsening respiratory status and low threshold to contact CCM if clinically appropriate.  Rise Patience, DO 05/21/2021, 9:31 PM PGY-2, [facility] Night Resident  Please page [phone] with questions.

## 2021-05-21 NOTE — Progress Notes (Signed)
Inpatient Rehab Admissions Coordinator:    Pt. Continues on HFNC, now NPO. Not medically stable for CIR at this time. I will continue to follow for potential admit pending medical readiness, bed availability, and insurance auth.   Clemens Catholic, [place], [place] Admissions Coordinator  [phone] ([place]) [phone] (office)

## 2021-05-21 NOTE — Progress Notes (Addendum)
Pt not tolerating BiPAP, continues to ask to be take off Bipap.  Pt taken off Bipap and placed back on HHFNC on 35L 50% FiO2.

## 2021-05-21 NOTE — Progress Notes (Signed)
ANTICOAGULATION CONSULT NOTE - Follow Up Consult  Pharmacy Consult for heparin Indication: atrial fibrillation and pulmonary embolus  Labs: Recent Labs    05/19/21 0443 05/19/21 1405 05/20/21 0053 05/20/21 0110 05/20/21 0253 05/20/21 0744 05/20/21 1729 05/21/21 0352  HGB 9.6*  --   --  10.4*  --   --   --  9.5*  HCT 28.5*  --   --  31.2*  --   --   --  28.4*  PLT 311  --   --  344  --   --   --  357  HEPARINUNFRC  --   --   --   --   --  0.18* 0.13* 0.26*  CREATININE  --  0.70 0.76  --   --   --   --  0.79  TROPONINIHS  --   --  837*  --  840*  --   --   --     Assessment: 80yo female remains subtherapeutic on heparin after rate change though approaching goal; no infusion issues or signs of bleeding per RN.  Goal of Therapy:  Heparin level 0.3-0.7 units/ml   Plan:  Will increase heparin infusion by 1-2 units/kg/hr to 1500 units/hr and check level in 8 hours.    Wynona Neat, PharmD, BCPS  05/21/2021,5:07 AM

## 2021-05-21 NOTE — Progress Notes (Addendum)
Patient with difficulty swallowing this AM, Patient became nauseated with some emesis, patient then with increased work of breathing than from earlier AM. Patient currently on HFNC.  At that time Patient heart rate dropped to 58 but then quickly recovered to low 100s on monitor. DR. Elsworth Soho made aware. Stated to Keep patient NPO.  Kimiah Hibner, Bettina Gavia RN

## 2021-05-21 NOTE — Progress Notes (Signed)
*  PRELIMINARY RESULTS* Echocardiogram 2D Echocardiogram has been performed.  Dana Coleman 05/21/2021, 9:15 AM

## 2021-05-21 NOTE — Therapy (Signed)
Occupational Therapy Treatment Patient Details Name: Dana Coleman MRN: [id number] DOB: [birth year] Today's Date: 05/21/2021    History of present illness Pt is a 80 yo female admitted on 05/04/21 for AMS. HX of cholecystectomy on 7/21.  Intubated upon admission, 7/24 Started CRRT, Esophageal perforation with septic shock post flap repair, drainage of empyema and decortication of R lung with chest tube placed.  7/25 EKG changes and probable nstemi. Extubated 7/26. PMH includes: R breast CA, DM, CAD, HTN.   OT comments  Pt was seen for limited OT ADL retraining session at bed level due to current medical status. Pt completed grooming and UB bathing w/ set-up - Min guard assist. Pt required rest break due to labored breathing. Pt is limited by overall deconditioning, impaired activity tolerance, pain & fatigue. OT goals have been extended w/ focus on increased independence and functional mobility for ADL's. RN made aware of request for pain medication and difficulty breathing with limited activity. Respirations 20.   Follow Up Recommendations  CIR;Supervision/Assistance - 24 hour    Equipment Recommendations  3 in 1 bedside commode    Recommendations for Other Services      Precautions / Restrictions Precautions Precautions: Fall Precaution Comments: R chest tube       Mobility Bed Mobility Overal bed mobility: Needs Assistance Bed Mobility: Rolling (repositioning in bed) Rolling: Mod assist         General bed mobility comments: Pt declined OOB activity due to pain and respirations. RN is aware. She was able to participate in bed level ADL's this date.    Transfers    General transfer comment: Deferred secondary to medical status/pain.        ADL either performed or assessed with clinical judgement   ADL Overall ADL's : Needs assistance/impaired     Grooming: Wash/dry hands;Wash/dry face;Set up;Cueing for sequencing;Bed level (Pt declined sitting up secondary to  pain. Participated at bed level) Grooming Details (indicate cue type and reason): Verbal cues, slow to process Upper Body Bathing: Set up;Min guard;Bed level;Cueing for sequencing Upper Body Bathing Details (indicate cue type and reason): Min verbal and tactile cues for bathing UE's, increased time. Slow processing.        General ADL Comments: Pt is limited by pain, weakness, lethergy and decreased activity tolerance. (RN to bring pain medication.)      Cognition Arousal/Alertness: Awake/alert Behavior During Therapy: Flat affect Overall Cognitive Status: Impaired/Different from baseline Area of Impairment: Problem solving;Following commands;Awareness      Following Commands: Follows one step commands consistently;Follows one step commands with increased time   Awareness: Emergent Problem Solving: Slow processing;Requires verbal cues General Comments: pt with slow processing, requires max encouragement.              General Comments Pt fatigued, lethargic, decreased activity tolerance overall.    Pertinent Vitals/ Pain       Pain Assessment: 0-10 Pain Score: 8  Faces Pain Scale: Hurts whole lot Pain Location: R flank Pain Descriptors / Indicators: Discomfort;Grimacing;Moaning Pain Intervention(s): Limited activity within patient's tolerance;Monitored during session;Other (comment) (RN made aware and going to give pain medication)   Frequency  Min 2X/week        Progress Toward Goals  OT Goals(current goals can now be found in the care plan section)  Progress towards OT goals: Progressing toward goals (Progression limited due to medical status and pain. Will continue toward current plan of care. Extend/continue w/ goals.)  Acute Rehab OT Goals Patient Stated Goal:  Decreased pain, breathe easier OT Goal Formulation: With patient Time For Goal Achievement: 06/05/21 Potential to Achieve Goals: Good  Plan Discharge plan remains appropriate;Frequency remains  appropriate       AM-PAC OT "6 Clicks" Daily Activity     Outcome Measure   Help from another person eating meals?: A Little Help from another person taking care of personal grooming?: A Little Help from another person toileting, which includes using toliet, bedpan, or urinal?: A Little Help from another person bathing (including washing, rinsing, drying)?: A Lot Help from another person to put on and taking off regular upper body clothing?: A Little Help from another person to put on and taking off regular lower body clothing?: A Lot 6 Click Score: 16    End of Session Equipment Utilized During Treatment: Oxygen  OT Visit Diagnosis: Other abnormalities of gait and mobility (R26.89);Muscle weakness (generalized) (M62.81);Pain;Other symptoms and signs involving cognitive function Pain - Right/Left: Right Pain - part of body:  (R side)   Activity Tolerance Patient limited by fatigue;Patient limited by lethargy;Patient limited by pain   Patient Left in bed;with call bell/phone within reach   Nurse Communication Other (comment) (Pt requesting pain medication and chapstick)        Time: [id number] OT Time Calculation (min): 15 min  Charges: OT General Charges $OT Visit: 1 Visit OT Treatments $Self Care/Home Management : 8-22 mins   Anguel Delapena Beth Dixon, OTR/L 05/21/2021, 9:32 AM

## 2021-05-21 NOTE — Progress Notes (Addendum)
[place] [address]       [employer] [address]             [phone]      16 Days Post-Op Procedure(s) (LRB): THORACOTOMY MAJOR REPAIR PERFORATED ESOPHAGUS (Right) Subjective: Feels fair, + SOB  Objective: Vital signs in last 24 hours: Temp:  [97.7 F (36.5 C)-98.8 F (37.1 C)] 97.9 F (36.6 C) (08/09 0426) Pulse Rate:  [82-102] 102 (08/09 0426) Cardiac Rhythm: Sinus tachycardia;Bundle branch block (08/08 1912) Resp:  [20-27] 20 (08/09 0426) BP: (114-136)/(75-80) 136/80 (08/09 0426) SpO2:  [93 %-99 %] 96 % (08/09 0500) FiO2 (%):  [40 %-50 %] 50 % (08/09 0500)  Hemodynamic parameters for last 24 hours:    Intake/Output from previous day: 08/08 0701 - 08/09 0700 In: 2556.2 [I.V.:1508.6; IV Piggyback:1047.6] Out: 3450 [Urine:3450] Intake/Output this shift: No intake/output data recorded.  General appearance: alert, cooperative, fatigued, and mild distress Heart: irregularly irregular rhythm Lungs: dim in lower fields Abdomen: soft, non tender Extremities: no edema Wound: incis healing well  Lab Results: Recent Labs    05/20/21 0110 05/21/21 0352  WBC 12.9* 13.0*  HGB 10.4* 9.5*  HCT 31.2* 28.4*  PLT 344 357   BMET:  Recent Labs    05/20/21 0053 05/21/21 0352  NA 132* 134*  K 3.9 3.1*  CL 104 101  CO2 20* 22  GLUCOSE 234* 216*  BUN 25* 31*  CREATININE 0.76 0.79  CALCIUM 8.2* 8.3*    PT/INR: No results for input(s): LABPROT, INR in the last 72 hours. ABG    Component Value Date/Time   PHART 7.417 05/20/2021 0358   HCO3 15.1 (L) 05/20/2021 0358   TCO2 29 05/07/2021 0516   ACIDBASEDEF 8.6 (H) 05/20/2021 0358   O2SAT 90.3 05/20/2021 0358   CBG (last 3)  Recent Labs    05/20/21 2012 05/20/21 2338 05/21/21 0432  GLUCAP 222* 198* 191*    Meds Scheduled Meds:  sodium chloride   Intravenous Once   chlorhexidine  15 mL Mouth Rinse BID   Chlorhexidine Gluconate Cloth  6 each Topical Daily   feeding supplement  237 mL Oral BID  BM   Gerhardt's butt cream  1 application Topical QID   insulin aspart  0-24 Units Subcutaneous Q4H   lidocaine  1 patch Transdermal Q24H   mouth rinse  15 mL Mouth Rinse q12n4p   metoprolol tartrate  5 mg Intravenous Q6H   pantoprazole (PROTONIX) IV  40 mg Intravenous QHS   sodium chloride flush  10-40 mL Intracatheter Q12H   Continuous Infusions:  sodium chloride 500 mL (05/19/21 2342)   anidulafungin Stopped (05/20/21 1450)   cefTRIAXone (ROCEPHIN)  IV Stopped (05/20/21 1340)   heparin 1,500 Units/hr (05/21/21 0534)   metronidazole Stopped (05/21/21 0032)   potassium chloride     TPN CYCLIC-ADULT (ION) 123456 mL/hr at 05/21/21 0534   PRN Meds:.sodium chloride, acetaminophen, acetaminophen, fentaNYL (SUBLIMAZE) injection, ondansetron (ZOFRAN) IV, oxyCODONE, sodium chloride, sodium chloride flush  Xrays CT Angio Chest Pulmonary Embolism (PE) W or WO Contrast  Result Date: 05/20/2021 CLINICAL DATA:  Thoracotomy for perforated esophagus 05/05/2021. Prolonged hospitalization, right empyema with chest tube. Respiratory failure. Atrial fibrillation. Elevated D-dimer level. EXAM: CT ANGIOGRAPHY CHEST WITH CONTRAST TECHNIQUE: Multidetector CT imaging of the chest was performed using the standard protocol during bolus administration of intravenous contrast. Multiplanar CT image reconstructions and MIPs were obtained to evaluate the vascular anatomy. CONTRAST:  33m OMNIPAQUE IOHEXOL 350 MG/ML SOLN COMPARISON:  Chest radiograph 05/19/2021 and chest CT from 03/29/2021 FINDINGS: Cardiovascular: Filling defect in the right middle lobe, right lower lobe, and left upper lobe pulmonary arterial tree is compatible with acute pulmonary embolus. Clot burden is moderate. Right ventricular to left ventricular ratio 1.2. Moderate cardiomegaly. Coronary, aortic arch, and branch vessel atherosclerotic vascular disease. Mediastinum/Nodes: Likely reactive scattered mediastinal lymph nodes including a right lower  paratracheal node at 1.2 cm in short axis on image 160 series 7 (formerly 0.5 cm). Lungs/Pleura: Small left pleural effusion. Complex right pleural effusion with loculated elements compatible with empyema. Questionable trace residual locule of pleural gas posteriorly in the right hemithorax on image 37 series 5. There is atelectasis along the margins of the presumed empyema, along with secondary pulmonary lobular interstitial accentuation in the right lung and airway thickening in the right lung compatible with interstitial edema. There is also some consolidation inferiorly in the right lower lobe for example image 95 series 7, possibly from pulmonary hemorrhage or pneumonia. Upper Abdomen: Unremarkable Musculoskeletal: There is evidence of prior right thoracotomy with a staple line noted. Abnormal density along the right chest wall including along the serratus anterior muscle with thickening and likely hematoma in this region. Calcified right lateral breast lesion on image 88 series 5. Right fourth rib osteotomy. Lateral right fifth and sixth rib fractures. Mild thoracic spondylosis. Review of the MIP images confirms the above findings. IMPRESSION: 1. Moderate bilateral acute pulmonary embolus. Positive for acute PE with CT evidence of right heart strain (RV/LV Ratio = 1.2) consistent with at least submassive (intermediate risk) PE. The presence of right heart strain has been associated with an increased risk of morbidity and mortality. Please refer to the "PE Focused" order set in EPIC. 2. Complex right pleural effusion compatible with empyema. 3. Airway thickening and secondary pulmonary lobular interstitial accentuation in the right lung potentially from interstitial edema. Consolidation in the right lower lobe from pulmonary hemorrhage or pneumonia. 4. Prior right thoracotomy, with right fourth rib osteotomy in fractures the right lateral fifth and sixth ribs. Hematoma along the right serratus anterior muscle.  5. Small left pleural effusion. 6. Reactive mediastinal lymph nodes. 7.  Aortic Atherosclerosis (ICD10-I70.0).  Coronary atherosclerosis. Critical Value/emergent results were called by telephone at the time of interpretation on 05/20/2021 at 6:16 am to provider Dr. Andrena Mews, who verbally acknowledged these results. Electronically Signed   By: Van Clines M.D.   On: 05/20/2021 06:23   DG CHEST PORT 1 VIEW  Result Date: 05/21/2021 CLINICAL DATA:  Shortness of breath, known pulmonary emboli. EXAM: PORTABLE CHEST 1 VIEW COMPARISON:  05/19/2021 FINDINGS: Cardiac shadow remains enlarged. Left-sided PICC line is stable. Postsurgical changes in the right chest wall are again noted. Stable bibasilar opacities are seen. Patchy airspace disease is noted within the right lung as well. IMPRESSION: Stable appearance of the chest when compared with the prior exam. Electronically Signed   By: Inez Catalina M.D.   On: 05/21/2021 02:24   DG CHEST PORT 1 VIEW  Result Date: 05/19/2021 CLINICAL DATA:  Dyspnea EXAM: PORTABLE CHEST 1 VIEW COMPARISON:  05/17/2021 FINDINGS: Cardiac shadow is enlarged but stable. Aortic calcifications are again seen. Left-sided PICC line is noted and stable. Postsurgical changes are seen in the right lateral chest wall. Multiple rib fractures are noted on the right which appears stable in appearance from previous exam. No pneumothorax is noted. Patchy bibasilar airspace opacity is noted. IMPRESSION: Stable bibasilar airspace opacity. No recurrent pneumothorax is noted. Electronically Signed  By: Inez Catalina M.D.   On: 05/19/2021 22:01    Assessment/Plan: S/P Procedure(s) (LRB): THORACOTOMY MAJOR REPAIR PERFORATED ESOPHAGUS (Right)  1 afeb, VSS, current in afib, on heparin gtt, c? Consider amio 2 increasing O2 need, HFNC 39 liter/50%- trial  Bipap ordered 3 excellent UOP- normal BUN/Creat, receiving intermit lasix 4 ID- Eraxis/rocephin and flagyl, leukocytosis stable 5 lactic acis  trending down 6 TSH normal range 7 hypokalemia 3.1- being replaced 8 severe protein cal def, cont TPN , cont NPO with poor pulm status 9 echo results pending 10 H/H trending lower- monitor closely 11 cont current PE management, may need to consider IVC filter 12 rehab/pulm toilet as able, CXR appearance is stable       LOS: 17 days    John Giovanni PA-C Pager [phone] 05/21/2021  Patient seen and examined, agree with above On 25L HFNC currently On heparin for PE Status remains tenuous, would have low threshold for transfer back to ICU  Remo Lipps C. Roxan Hockey, MD [facility] [phone]

## 2021-05-21 NOTE — Progress Notes (Signed)
Family Medicine Teaching Service Daily Progress Note Intern Pager: [phone]  Patient name: Dana Coleman Medical record number: [id number] Date of birth: 1941-08-20 Age: 80 y.o. Gender: female  Primary Care Provider: Gifford Shave, MD Consultants: CCM, CVTS, cardiology, general surgery (s/o), nephrology (s/o) Code Status: Full  Pt Overview and Major Events to Date:  7/23: right pigtail catheter placed, admitted to ICU, intubated 7/24: CRRT, emergent right thoracotomy, repair of esophageal perforation with intercostal pedicled muscle flap and drainage of empyema and decortication of right lung 7/25: probable inferior NSTEMI, no acute intervention. Echo with mildly reduced LV, large RV 7/26: extubated, double-lumen PICC placed, TPN started 8/1: Esophragram without leak, started on PO, heparin gtt stopped to increased bleeding from chest tubes. Declotted by CVTS. Transfused 1u pRBC 8/3: anterior chest tube removed 8/4: remaining chest tube removed 8/8: increased O2 requirement, Afib RVR -> PE with right heart strain 8/9 further increased O2 requirement, Afib  Abx timeline Zosyn: 7/23-7/26 Eraxis 7/23- Unasyn: 7/26-7/31 Meropenem: 7/31-8/3 Ceftriaxone: 8/3- Metronidazole: 8/3-  Micro Timeline 7/23: MRSA PCR negative 7/23: pleural fluid grew strep salivarius pan-sensitive, strep mitas (sensitive CTX), rare candida albicans 7/24: blood cultures negative x2 7/30: blood cultures negative x2  Assessment and Plan: [name] is a 80 year old female was admitted on 7/23 for AMS and SOB, subsequently found to have acute renal failure, esophageal perforation now POD #16 from repair.  Her admission thus far was collocated by probable inferior NSTEMI, acute blood loss anemia requiring 1 unit pRBC, atrial fibrillation and A. fib with RVR with consequent PE with right heart strain.  Past medical history significant for T1DM, CAD, HTN, HLD, MI (2004), Hurthle cell adenoma (2004),  cataracts, cervical cancer.  Esophageal perforation status post repair  empyema  POD #16, patient does not currently endorse right-sided back pain but unable to engage in active conversation, she still feels it is difficult for her to breathe.  WBC 13.0 today. -Continue Eraxis monitor milligrams daily -Continue metronidazole 500 mg twice daily -Continue ceftriaxone 2 g daily -Antibiotic course to be completed on 05/23/2021, may change -Continue Tylenol 650 mg every 4 hours as needed -Continue oxycodone 5 to 10 mg every 6 hours as needed   A. fib with RVR  PE with right heart strain  hypoxia Patient became more short of breath overnight and did not tolerate BiPAP well.  She was eventually placed on HFNC 50% 35 L.  Chest x-ray appearance was stable and unchanged from previously still noting bibasilar opacities.  Patient potassium was notably 3.1 and may have contributed to A. fib.  Bilateral lower extremity venous duplex for concern of DVT was ordered and echo to examine worsening cardiac function. -CVTS and CCM still following, appreciate recommendations -Consult cardiology, appreciate recommendations -Replete potassium with 10 mEq x 4 IV.  May consider further repletion -Pending bilateral lower extremity venous duplex -Pending echo  Severe protein malnutrition  deconditioned state   -NPO at this time but continuing TPN feeds -Pause ambulation with PT/OT  Anemia Hemoglobin 9.5 today.  Does not meet threshold for transfusion. -Transfusion threshold less than 8  FEN/GI: NPO, cont. TPN PPx: Heparin 1550 units/h IV Dispo: pending clinical improvement . Barriers include clinical status poor.   Subjective:  Patient appears uncomfortable tachypneic in the room.  She was more focused on breathing and not able to converse properly.  She states it is difficult to breathe but not painful.  Objective: Temp:  [97.7 F (36.5 C)-98.8 F (37.1 C)] 98.8 F (37.1 C) (08/09  [id number]) Pulse Rate:   [82-102] 102 (08/09 0426) Resp:  [18-34] 34 (08/09 0800) BP: (114-136)/(75-85) 124/85 (08/09 0800) SpO2:  [93 %-99 %] 96 % (08/09 0800) FiO2 (%):  [40 %-50 %] 50 % (08/09 0800) Physical Exam: General: Unwell appearing, minimally conversational due to clinical status, concerned look on face Cardiovascular: Irregular rate, irregular rhythm, no murmurs auscultated Respiratory: Diminished lung sounds in the right base, no wheezing or crackles Abdomen: Soft, nontender, nondistended Extremities: +2 radial pulses bilaterally  Laboratory: Recent Labs  Lab 05/19/21 0443 05/20/21 0110 05/21/21 0352  WBC 11.8* 12.9* 13.0*  HGB 9.6* 10.4* 9.5*  HCT 28.5* 31.2* 28.4*  PLT 311 344 357   Recent Labs  Lab 05/19/21 1405 05/20/21 0053 05/21/21 0352  NA 134* 132* 134*  K 4.2 3.9 3.1*  CL 106 104 101  CO2 19* 20* 22  BUN 25* 25* 31*  CREATININE 0.70 0.76 0.79  CALCIUM 8.5* 8.2* 8.3*  PROT 6.1* 5.7* 6.4*  BILITOT 0.5 0.3 0.4  ALKPHOS 86 83 72  ALT 73* 61* 43  AST 32 29 30  GLUCOSE 106* 234* 216*   CBG (last 3)  Recent Labs    05/21/21 0432 05/21/21 0734 05/21/21 1148  GLUCAP 191* 228* 128*    Imaging/Diagnostic Tests: DG chest portable 1 view: Stable appearance of the chest when compared with prior exam still noting stable bibasilar opacities.  Wells Guiles, DO 05/21/2021, 9:41 AM PGY-1, [place] Intern pager: [phone], text pages welcome

## 2021-05-22 ENCOUNTER — Inpatient Hospital Stay (HOSPITAL_COMMUNITY): Payer: Medicare Other

## 2021-05-22 DIAGNOSIS — I2699 Other pulmonary embolism without acute cor pulmonale: Secondary | ICD-10-CM | POA: Diagnosis not present

## 2021-05-22 DIAGNOSIS — R531 Weakness: Secondary | ICD-10-CM

## 2021-05-22 DIAGNOSIS — Z7189 Other specified counseling: Secondary | ICD-10-CM

## 2021-05-22 DIAGNOSIS — J939 Pneumothorax, unspecified: Secondary | ICD-10-CM | POA: Diagnosis not present

## 2021-05-22 DIAGNOSIS — Z515 Encounter for palliative care: Secondary | ICD-10-CM

## 2021-05-22 DIAGNOSIS — J9601 Acute respiratory failure with hypoxia: Secondary | ICD-10-CM | POA: Diagnosis not present

## 2021-05-22 DIAGNOSIS — K223 Perforation of esophagus: Secondary | ICD-10-CM | POA: Diagnosis not present

## 2021-05-22 LAB — BLOOD GAS, ARTERIAL
Acid-base deficit: 3.5 mmol/L — ABNORMAL HIGH (ref 0.0–2.0)
Bicarbonate: 20.2 mmol/L (ref 20.0–28.0)
Drawn by: 51185
FIO2: 40
O2 Saturation: 91.1 %
Patient temperature: 37
pCO2 arterial: 31.7 mmHg — ABNORMAL LOW (ref 32.0–48.0)
pH, Arterial: 7.42 (ref 7.350–7.450)
pO2, Arterial: 65.4 mmHg — ABNORMAL LOW (ref 83.0–108.0)

## 2021-05-22 LAB — GLUCOSE, CAPILLARY
Glucose-Capillary: 105 mg/dL — ABNORMAL HIGH (ref 70–99)
Glucose-Capillary: 148 mg/dL — ABNORMAL HIGH (ref 70–99)
Glucose-Capillary: 156 mg/dL — ABNORMAL HIGH (ref 70–99)
Glucose-Capillary: 168 mg/dL — ABNORMAL HIGH (ref 70–99)
Glucose-Capillary: 169 mg/dL — ABNORMAL HIGH (ref 70–99)
Glucose-Capillary: 174 mg/dL — ABNORMAL HIGH (ref 70–99)
Glucose-Capillary: 204 mg/dL — ABNORMAL HIGH (ref 70–99)

## 2021-05-22 LAB — CBC
HCT: 28.1 % — ABNORMAL LOW (ref 36.0–46.0)
HCT: 30.5 % — ABNORMAL LOW (ref 36.0–46.0)
Hemoglobin: 9.2 g/dL — ABNORMAL LOW (ref 12.0–15.0)
Hemoglobin: 9.9 g/dL — ABNORMAL LOW (ref 12.0–15.0)
MCH: 34.3 pg — ABNORMAL HIGH (ref 26.0–34.0)
MCH: 29.9 pg (ref 26.0–34.0)
MCHC: 32.7 g/dL (ref 30.0–36.0)
MCHC: 32.5 g/dL (ref 30.0–36.0)
MCV: 104.9 fL — ABNORMAL HIGH (ref 80.0–100.0)
MCV: 92.1 fL (ref 80.0–100.0)
Platelets: 327 10*3/uL (ref 150–400)
Platelets: 382 10*3/uL (ref 150–400)
RBC: 2.68 MIL/uL — ABNORMAL LOW (ref 3.87–5.11)
RBC: 3.31 MIL/uL — ABNORMAL LOW (ref 3.87–5.11)
RDW: 18.7 % — ABNORMAL HIGH (ref 11.5–15.5)
RDW: 17.5 % — ABNORMAL HIGH (ref 11.5–15.5)
WBC: 9.5 10*3/uL (ref 4.0–10.5)
WBC: 11.6 10*3/uL — ABNORMAL HIGH (ref 4.0–10.5)
nRBC: 0.4 % — ABNORMAL HIGH (ref 0.0–0.2)
nRBC: 0.5 % — ABNORMAL HIGH (ref 0.0–0.2)

## 2021-05-22 LAB — HEPARIN LEVEL (UNFRACTIONATED): Heparin Unfractionated: 0.33 IU/mL (ref 0.30–0.70)

## 2021-05-22 LAB — COMPREHENSIVE METABOLIC PANEL
ALT: 37 U/L (ref 0–44)
AST: 32 U/L (ref 15–41)
Albumin: 2 g/dL — ABNORMAL LOW (ref 3.5–5.0)
Alkaline Phosphatase: 70 U/L (ref 38–126)
Anion gap: 11 (ref 5–15)
BUN: 34 mg/dL — ABNORMAL HIGH (ref 8–23)
CO2: 20 mmol/L — ABNORMAL LOW (ref 22–32)
Calcium: 8.4 mg/dL — ABNORMAL LOW (ref 8.9–10.3)
Chloride: 103 mmol/L (ref 98–111)
Creatinine, Ser: 0.76 mg/dL (ref 0.44–1.00)
GFR, Estimated: >60 mL/min (ref >60)
Glucose, Bld: 175 mg/dL — ABNORMAL HIGH (ref 70–99)
Potassium: 3.8 mmol/L (ref 3.5–5.1)
Sodium: 134 mmol/L — ABNORMAL LOW (ref 135–145)
Total Bilirubin: 0.6 mg/dL (ref 0.3–1.2)
Total Protein: 6.2 g/dL — ABNORMAL LOW (ref 6.5–8.1)

## 2021-05-22 LAB — BRAIN NATRIURETIC PEPTIDE: B Natriuretic Peptide: 2870.2 pg/mL — ABNORMAL HIGH (ref 0.0–100.0)

## 2021-05-22 LAB — MAGNESIUM: Magnesium: 2.1 mg/dL (ref 1.7–2.4)

## 2021-05-22 MED ORDER — FUROSEMIDE 10 MG/ML IJ SOLN
40.0000 mg | Freq: Once | INTRAMUSCULAR | Status: AC
  Administered 2021-05-22: 40 mg via INTRAVENOUS
  Filled 2021-05-22: qty 4

## 2021-05-22 MED ORDER — TRACE MINERALS CU-MN-SE-ZN 300-55-60-3000 MCG/ML IV SOLN
INTRAVENOUS | Status: AC
  Filled 2021-05-22: qty 745.5

## 2021-05-22 MED ORDER — LORAZEPAM 2 MG/ML IJ SOLN
0.2500 mg | Freq: Once | INTRAMUSCULAR | Status: AC
  Administered 2021-05-22: 0.25 mg via INTRAVENOUS

## 2021-05-22 MED ORDER — RAMELTEON 8 MG PO TABS
8.0000 mg | ORAL_TABLET | Freq: Every day | ORAL | Status: DC
  Administered 2021-05-23 – 2021-05-26 (×5): 8 mg via ORAL
  Filled 2021-05-22 (×5): qty 1

## 2021-05-22 MED ORDER — LORAZEPAM 2 MG/ML IJ SOLN
INTRAMUSCULAR | Status: AC
  Filled 2021-05-22: qty 1

## 2021-05-22 NOTE — Consult Note (Signed)
Consultation Note Date: 05/22/2021   Patient Name: Dana Coleman  DOB: Apr 19, 1941  MRN: [id number]  Age / Sex: 80 y.o., female  PCP: Gifford Shave, MD Referring Physician: Kinnie Feil, MD  Reason for Consultation: Establishing goals of care and Psychosocial/spiritual support  HPI/Patient Profile: 80 y.o. female  admitted on 05/04/2021 with hx of HTN, IDDM, CAD who was recently admitted and underwent cholecystectomy on 7/21 presented to ED today 7/23 w/  AMS and SOB.   In ED pt has low SpO2 82% on NRB at 15L, bradycardia in 40's and hypotension 88/60. CXR showed large loculated R pleural effusion. PCCM was consulted and intubated the patient.  Gen surgery placed a R chest tube for suspected empyema. Labs showed metabolic acidosis severe and creat 4 (normal creat 0.8 on 7/15).   Cardiothoracic consulted on 7-24 for esophageal perforation.  Hospital course has been complicated by non- STEMI and acute PE.  EF is 25%.  Increasing oxygen needs , currently on continuous TPN.  Today is day 17 of this hospitalization and patient        Clinical Assessment and Goals of Care:  This NP Wadie Lessen reviewed medical records, received report from team, assessed the patient and then meet at the patient's bedside along with her family to include her sister/ Dana Coleman, brother Dana Coleman, grand-son/ Dana Coleman and grand-daughter/ Dana Coleman to discuss diagnosis, prognosis, GOC, EOL wishes disposition and options.   Concept of Palliative Care was introduced as specialized medical care for people and their families living with serious illness.  If focuses on providing relief from the symptoms and stress of a serious illness.  The goal is to improve quality of life for both the patient and the family.  Values and goals of care important to patient and family were attempted to be elicited.  Created space and opportunity  for patient  and family to explore thoughts and feelings regarding current medical situation.  Both patient and her family have many questions regarding "how "all this happened so fast".  They have questions regarding esophageal perforation and its cause. They would like to speak to surgery .    I answered questions and concerns to the best of my ability.   I will contact [facility] to update them on family concerns   All understand the seriousness of the current medical situation and are worried about the patient.  Family voiced concern that they have not been updated on a regular basis and the patient is unable advocate for herself at this point in time.     I contacted the attending team, family medicine and spoke with Dr. Quentin Cornwall.  At family request she plans to be at the bedside no later than 430 to meet with them.     A  discussion was had today regarding advanced directives.  Concepts specific to code status, artifical feeding and hydration, continued IV antibiotics and rehospitalization was had.    The difference between a aggressive medical intervention path  and a palliative comfort  care path for this patient at this time was had.     Education offered on the MOST form and left for review.     PMT will continue to support holistically.    No documented healthcare power of attorney or advanced care planning documents.  Patient tells me today that in the event that she cannot speak for herself she would like for her Sister Dana Coleman to be her voice.    SUMMARY OF RECOMMENDATIONS     Code Status/Advance Care Planning: Limited code-patient herself clearly verbalizes today that in the event of a cardiac arrest she does not want compressions, cardioversion or intubation.   Family at bedside support her decision.   Palliative Prophylaxis:  Aspiration, Delirium Protocol, Frequent Pain Assessment, and Oral Care  Additional Recommendations (Limitations, Scope,  Preferences): Full Scope Treatment  Psycho-social/Spiritual:  Desire for further Chaplaincy support:yes   Prognosis:  Unable to determine  Discharge Planning: To Be Determined      Primary Diagnoses: Present on Admission:  Respiratory failure ([place])  Esophageal perforation   I have reviewed the medical record, interviewed the patient and family, and examined the patient. The following aspects are pertinent.  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   Hyperlipidemia    Hypertension    Personal history of radiation therapy 2009   Social History   Socioeconomic History   Marital status: Divorced    Spouse name: Not on file   Number of children: 5   Years of education: 10   Highest education level: 10th grade  Occupational History   Occupation: Retired- IT trainer  Tobacco Use   Smoking status: Never   Smokeless tobacco: Never   Tobacco comments:    No plans to start  Vaping Use   Vaping Use: Never used  Substance and Sexual Activity   Alcohol use: No   Drug use: No   Sexual activity: Not Currently    Birth control/protection: Condom  Other Topics Concern   Not on file  Social History Narrative   Current Social History 07/15/2018     Lives alone, divorced. Lives in one level house, has three stairs in front, with handrail. No grab bars in bathroom. Does have throw rugs on floor. Has smoke alarms, adequate lighting. Has family in [place].  Does light house cleaning part time.     Transportation: Patient has own vehicle    Religious / Personal Beliefs: [place]: info given   End of Life Plan: gave pt  living will referral info   Emergency Contact: daughter, Dana Coleman [phone]   Any pets: none   Diet: Pt has a varied diet of protein, starch, and vegetables and fruits.   Seatbelts: Pt reports wearing seatbelt when in vehicles.    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    Current Stressors: Daughter, Danton Clap (19 yo) had MI  Social Determinants of Health   Financial Resource Strain: Not on file  Food Insecurity: Not on file  Transportation Needs: Not on file  Physical Activity: Not on file  Stress: Not on file  Social Connections: Not on file   Family History  Problem Relation Age of Onset   Heart disease Mother        age 73   Heart disease Father    Cancer Father        unknown   Heart disease Brother    Hypertension Brother    Diabetes Daughter    Stroke Daughter    Cirrhosis Brother    Colon cancer Neg Hx    Colon polyps Neg Hx    Esophageal cancer Neg Hx    Stomach cancer Neg Hx    Rectal cancer Neg Hx    Scheduled Meds:  sodium chloride   Intravenous Once   acetaminophen  650 mg Rectal Q4H   chlorhexidine  15 mL Mouth Rinse BID   Chlorhexidine Gluconate Cloth  6 each Topical Daily   feeding supplement  237 mL Oral BID BM   Gerhardt's butt cream  1 application Topical QID   insulin aspart  0-24 Units Subcutaneous Q4H   lidocaine  1 patch Transdermal Q24H   LORazepam       mouth rinse  15 mL Mouth Rinse q12n4p   metoprolol tartrate  5 mg Intravenous Q6H   pantoprazole (PROTONIX) IV  40 mg Intravenous QHS   sodium chloride flush  10-40 mL Intracatheter Q12H   Continuous Infusions:  sodium chloride 10 mL/hr at 05/22/21 0300   anidulafungin Stopped (05/21/21 1401)   cefTRIAXone (ROCEPHIN)  IV Stopped (05/21/21 1234)   heparin 1,550 Units/hr (05/22/21 0831)   metronidazole 500 mg ([id number])   TPN CYCLIC-ADULT (ION)     PRN Meds:.sodium chloride,  fentaNYL (SUBLIMAZE) injection, ondansetron (ZOFRAN) IV, sodium chloride, sodium chloride flush Medications Prior to Admission:  Prior to Admission medications   Medication Sig Start Date End Date Taking? Authorizing Provider  amLODipine (NORVASC) 10 MG tablet TAKE 1 TABLET BY MOUTH EVERY DAY Patient taking differently: Take 10 mg by mouth daily. 07/20/20  Yes Gifford Shave, MD  aspirin EC 81 MG tablet Take 81 mg by mouth in the morning. Swallow whole.   Yes [provider]  Cholecalciferol (VITAMIN D3) 50 MCG (2000 UT) TABS Take 2,000 Units by mouth in the morning.   Yes [provider]  hydrochlorothiazide (HYDRODIURIL) 12.5 MG tablet TAKE 1 TABLET BY MOUTH EVERY DAY Patient taking differently: Take 12.5 mg by mouth daily. 09/25/20  Yes Gifford Shave, MD  JARDIANCE 10 MG TABS tablet TAKE 1 TABLET BY MOUTH EVERY DAY Patient taking differently: Take 10 mg by mouth daily. 12/05/20  Yes Gifford Shave, MD  LANTUS SOLOSTAR 100 UNIT/ML Solostar Pen INJECT 24 UNITS INTO THE SKIN EVERY MORNING. Patient taking differently: Inject 24 Units into the skin daily. 12/31/20  Yes Gifford Shave, MD  losartan (COZAAR) 50 MG tablet Take 50 mg by mouth in the morning. 05/24/19  Yes [provider]  metFORMIN (GLUCOPHAGE-XR) 500 MG 24 hr tablet TAKE 1 TABLET BY MOUTH TWICE A DAY WITH BREAKFAST AND DINNER Patient taking differently: Take 500 mg by mouth 2 (two) times daily. 03/25/21  Yes Gifford Shave, MD  nitroGLYCERIN (NITROSTAT) 0.4 MG SL tablet Place 1 tablet (0.4 mg total) under the tongue every 5 (five) minutes as needed for chest pain. Patient taking differently: Place 0.4 mg under  the tongue every 5 (five) minutes x 3 doses as needed for chest pain. 07/12/19  Yes Hensel, Jamal Collin, MD  omeprazole (PRILOSEC) 40 MG capsule Take 40 mg by mouth every morning. 12/12/20  Yes [provider]  rosuvastatin (CRESTOR) 20 MG tablet TAKE 1 TABLET BY MOUTH EVERYDAY AT  BEDTIME Patient taking differently: Take 20 mg by mouth at bedtime. 06/05/20  Yes Gifford Shave, MD  Blood Glucose Monitoring Suppl MISC One touch ultra glucose monitor. Check blood sugar twice a day.    [provider]  glucose blood (ONE TOUCH ULTRA TEST) test strip CHECK BLOOD SUGAR TWICE DAILY 09/24/18   Everrett Coombe, MD  Insulin Pen Needle (B-D UF III MINI PEN NEEDLES) 31G X 5 MM MISC INJECT INSULIN VIA PEN 6 TIMES DAILY 02/22/18   Everrett Coombe, MD  OneTouch Delica Lancets 99991111 MISC 1 Container by Does not apply route as needed. 06/07/19   Zenia Resides, MD   No Known Allergies Review of Systems  Respiratory:  Positive for shortness of breath.   Neurological:  Positive for weakness.   Physical Exam Constitutional:      Appearance: She is underweight. She is ill-appearing.     Interventions: Nasal cannula in place.  Cardiovascular:     Rate and Rhythm: Normal rate.  Pulmonary:     Breath sounds: Decreased air movement present.  Neurological:     Mental Status: She is lethargic.    Vital Signs: BP 118/73 (BP Location: Right Arm)   Pulse 96   Temp 98.5 F (36.9 C) (Oral)   Resp (!) 22   Ht '5\' 1"'$  (1.549 m)   Wt 70.3 kg   SpO2 100%   BMI 29.28 kg/m  Pain Scale: 0-10 POSS *See Group Information*: S-Acceptable,Sleep, easy to arouse Pain Score: 2    SpO2: SpO2: 100 % O2 Device:SpO2: 100 % O2 Flow Rate: .O2 Flow Rate (L/min): 30 L/min  IO: Intake/output summary:  Intake/Output Summary (Last 24 hours) at 05/22/2021 1121 Last data filed at 05/22/2021 0800 Gross per 24 hour  Intake 2774.28 ml  Output 1450 ml  Net 1324.28 ml    LBM: Last BM Date: 05/22/21 Baseline Weight: Weight: 58 kg Most recent weight: Weight: 70.3 kg     Palliative Assessment/Data:  20 %   Discussed with Dr Quentin Cornwall  Time In: 1330 Time Out: 1445 Time Total: 75 minutes Greater than 50%  of this time was spent counseling and coordinating care related to the above assessment and  plan.  Signed by: Wadie Lessen, NP   Please contact Palliative Medicine Team phone at [phone] for questions and concerns.  For individual provider: See Shea Evans

## 2021-05-22 NOTE — Progress Notes (Signed)
Inpatient Rehab Admissions Coordinator:   I do not have a CIR bed for this Pt. Today. She continues on 15L of oxygen and is not medically ready for CIR at this time. I will continue to follow for potential admit pending insurance auth, medical readiness, and bed availability.   Clemens Catholic, [place], [place] Admissions Coordinator  [phone] ([place]) [phone] (office)

## 2021-05-22 NOTE — Progress Notes (Addendum)
I discussed / reviewed the pharmacy note by Ms. Chaykowski and I agree with the student's findings and plans as documented.  Will increase heparin rate to maintain mid range therapeutic goal.  Horton Chin, Pharm.D., BCPS Clinical Pharmacist  05/22/2021 3:50 PM    ANTICOAGULATION CONSULT NOTE  Pharmacy Consult for heparin Indication: atrial fibrillation and pulmonary embolus  No Known Allergies  Patient Measurements: Height: '5\' 1"'$  (154.9 cm) Weight: 70.3 kg (154 lb 15.7 oz) IBW/kg (Calculated) : 47.8 Heparin Dosing Weight: 63 kg  Vital Signs: Temp: 98.2 F (36.8 C) (08/10 1200) Temp Source: Oral (08/10 1200) BP: 120/75 (08/10 1441) Pulse Rate: 91 (08/10 1441)  Labs: Recent Labs    05/20/21 0053 05/20/21 0110 05/20/21 0253 05/20/21 0744 05/21/21 0352 05/21/21 1203 05/22/21 0210 05/22/21 0224 05/22/21 0325 05/22/21 0554  HGB  --    < >  --   --  9.5*  --   --  9.2*  --  9.9*  HCT  --    < >  --   --  28.4*  --   --  28.1*  --  30.5*  PLT  --    < >  --   --  357  --   --  327  --  382  HEPARINUNFRC  --   --   --    < > 0.26*   < > RESULTS UNAVAILABLE DUE TO INTERFERING SUBSTANCE  --  RESULTS UNAVAILABLE DUE TO INTERFERING SUBSTANCE 0.33  CREATININE 0.76  --   --   --  0.79  --   --   --   --  0.76  TROPONINIHS 837*  --  840*  --   --   --   --   --   --   --    < > = values in this interval not displayed.     Estimated Creatinine Clearance: 51.1 mL/min (by C-G formula based on SCr of 0.76 mg/dL).   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 30 tablet 0 05/04/2021   amLODipine (NORVASC) 10 MG tablet TAKE 1 TABLET BY MOUTH EVERY DAY (Patient taking differently: Take 10 mg by mouth daily.) 90 tablet 3 05/04/2021   aspirin EC 81 MG tablet Take 81 mg by mouth in the morning. Swallow whole.   05/04/2021   Cholecalciferol (VITAMIN D3) 50 MCG (2000 UT) TABS Take 2,000 Units by mouth in the morning.   05/04/2021   hydrochlorothiazide (HYDRODIURIL) 12.5 MG tablet TAKE 1 TABLET BY MOUTH EVERY DAY (Patient taking differently: Take 12.5 mg by mouth daily.) 90 tablet 3 05/04/2021   JARDIANCE 10 MG TABS tablet TAKE 1 TABLET BY MOUTH EVERY DAY (Patient taking differently: Take  10 mg by mouth daily.) 90 tablet 3 05/04/2021   LANTUS SOLOSTAR 100 UNIT/ML Solostar Pen INJECT 24 UNITS INTO THE SKIN EVERY MORNING. (Patient taking differently: Inject 24 Units into the skin daily.) 3 mL 9 05/04/2021   losartan (COZAAR) 50 MG tablet Take 50 mg by mouth in the morning.   05/04/2021   metFORMIN (GLUCOPHAGE-XR) 500 MG 24 hr tablet TAKE 1 TABLET BY MOUTH TWICE A DAY WITH BREAKFAST AND DINNER (Patient taking differently: Take 500 mg by mouth 2 (two) times daily.) 180 tablet 0 05/04/2021   nitroGLYCERIN (NITROSTAT) 0.4 MG SL tablet Place 1 tablet (0.4 mg total) under the tongue every 5 (five) minutes as needed for chest pain. (Patient taking differently: Place 0.4 mg under the tongue every 5 (five) minutes x 3 doses as needed for chest pain.) 25 tablet 1 unknown   omeprazole (PRILOSEC) 40 MG capsule Take 40 mg by mouth every morning.   05/04/2021   rosuvastatin (CRESTOR) 20 MG tablet TAKE 1 TABLET BY MOUTH EVERYDAY AT BEDTIME (Patient taking differently: Take 20 mg by mouth at bedtime.) 90 tablet 3 05/03/2021   [EXPIRED] traMADol (ULTRAM) 50 MG tablet  Take 1 tablet (50 mg total) by mouth every 6 (six) hours as needed for up to 5 days. 15 tablet 0 05/04/2021   Blood Glucose Monitoring Suppl MISC One touch ultra glucose monitor. Check blood sugar twice a day.      glucose blood (ONE TOUCH ULTRA TEST) test strip CHECK BLOOD SUGAR TWICE DAILY 100 each 1    Insulin Pen Needle (B-D UF III MINI PEN NEEDLES) 31G X 5 MM MISC INJECT INSULIN VIA PEN 6 TIMES DAILY 200 each 3    OneTouch Delica Lancets 99991111 MISC 1 Container by Does not apply route as needed. 100 each PRN     Assessment: 80 yo female continuing on heparin for acute PE/afib. PMH: cholcystectomy (05/02/21). She was not on anticoagulation PTA.  She remains on TPN. No evidence of bleeding.  -heparin level = 0.33 and on the low end of goal -hgb = 9.9  Goal of Therapy:  Heparin level 0.3-0.7 units/ml Monitor platelets by anticoagulation protocol: Yes   Plan:  -Increase heparin to 1600 units/hr -Daily heparin level and CBC  Jethro Poling PharmD Student Pharmacist **Pharmacist phone directory can now be found on [email or web address] ([email or web address]).  Listed under [place].

## 2021-05-22 NOTE — Progress Notes (Signed)
PHARMACY - TOTAL PARENTERAL NUTRITION CONSULT NOTE  Indication: s/p esophageal perforation and repair  Patient Measurements: Height: '5\' 1"'$  (154.9 cm) Weight: 70.3 kg (154 lb 15.7 oz) IBW/kg (Calculated) : 47.8   Body mass index is 29.28 kg/m.  Assessment:  80 y/o female with recent lap chole for symptomatic cholelithiasis on 7/21. PMH including HTN, HLD, DM, R breast cancer, and CAD. Patient presented on 7/23 with AMS and unresponsiveness - was complaining of abdominal discomfort and then patient became unresponsive and slumped on table.  S/p gall bladder surgery and esophageal perforation-repaired 7/24.  Pharmacy consulted for TPN management.  Glucose / Insulin: DM with A1c 7.4% on Lantus 24/d PTA. CBGs 169-199 on TPN, 85 units in TPN, CBGs 115-128 while off TPN.  Utilized 22 units SSI + 85 units insulin in TPN  Electrolytes: Na 134, K 3.8 (Lasix '40mg'$  IV x 2 doses yesterday, 80 mEq yesterday), CoCa 10 WNL (no Ca in TPN), CO2 low, others WNL  Renal: septic ATN s/p CRRT (end 7/25) - SCr < 1 and stable, BUN up to 34 (getting Lasix) Hepatic: LFTs significantly increased 8/4 (decreased with holding TPN for 6 hours and Lasix), tbili WNL, albumin down 2, prealbumin up 8.9, TG WNL Intake / Output; MIVF:  UOP 1463m with Lasix, net +7L (up), chest tube removed, LBM 8/8 GI Imaging: - 7/23 CT: no acute abnormalities  GI Surgeries / Procedures:  - 7/21: s/p laparoscopic cholecystectomy and LOA - 7/24: s/p R thoracotomy, repair of esophageal perf with muscle flap, drainage of empyema and decortication of R lung  Central access: PICC 7/26 (double lumen) TPN start date: 05/07/21  Nutritional Goals (per RD rec on 8/9): kCal: 1750-2030 , Protein: 90-120 , Fluid: >=1.8L  Current Nutrition:  TPN Ensure Enlive BID - none charted given Soft diet on 8/6 - very low intake, declining help with feeding >> NPO since 8/8 No feeding tube recommended due to esophageal injury  Plan:  Continue concentrated  cyclic TPN - infuse [id number] over 16 hrs: 53 mL/hr x 1 hr, then 105 mL/hr x 14 hrs, then 53 mL/hr x 1 hr. TPN provides 111g AA, 230g CHO, and 58g ILE for total of 1803 kCal, meeting 100% of patient's needs. Electrolytes in TPN: Na 30 mEq/L (= 47 mEq), K 30 mEq/L (= 47 mEq), Ca 071m/L since 7/30, Mg 11 mEq/L (= 17 mEq), Phos 30 mmol/L (= 47 mmol), maximum acetate Add standard MVI and trace elements to TPN Continue CVTS SSI Q4H and adjust insulin in TPN to 90 units Adjust lytes outside of TPN while diuresing Monitor TPN labs on Mon/Thurs >> repeat in AM  Thank you for involving pharmacy in this patient's care.  JeRenold GentaPharmD, BCPS Clinical Pharmacist Clinical phone for 05/22/2021 until 3p is [phone]/07/2021 7:13 AM  **Pharmacist phone directory can be found on [facility] listed under [place]

## 2021-05-22 NOTE — Progress Notes (Signed)
NAME:  Dana Coleman, MRN:  [id number], DOB:  28-Nov-1940, LOS: 68 ADMISSION DATE:  05/04/2021, CONSULTATION DATE:  7/23 REFERRING MD:  Ralene Bathe, CHIEF COMPLAINT:  Dyspnea   History of Present Illness:  80 y/o female admitted for empyema after a lap chole, noted to have esophageal perforation. Underwent repair, course complicated by non-STEMI and acute PE  Pertinent  Medical History  DM2 CAD Hypertension Hyperlipidemia Prior R breast cancer  Significant Hospital Events: Including procedures, antibiotic start and stop dates in addition to other pertinent events   7/23 admitted intubated. Pigtail by [facility] in ED -- interestingly seems to have bilious outpt 7/24 Starting CRRT. Abundant GPCs in pleural fluid 7/24 1.  Right thoracotomy 2.  Repair of esophageal perforation with intercostal pedicled muscle flap. 3.  Drainage of empyema and decortication of right lung 7/25 early AM EKG changes, trop leak, probable inferiror NSTEMI, not candidate for intervention.  Echo with mildly reduced LV, large RV ?cause 7/26 extubated 7/29 up out of bed, rising WBC 7/30 blood culture >  7/31 WBC up again, cough with mucus production, chest pain 8/1 elevated WBC, added Vanco, MRSA swab pending, CT stripped from clot by [place]  8/2 pain control, OOB, passed swallow, started clears  8/3 mobility  8/8 increased O2 needs, PE on imaging, reconsulted.  CTA chest shows filling defects in right middle lobe right lower lobe and left upper lobe, RV/LV ratio 1.2 8/9 on 25 L / 50% high flow nasal cannula ,diuresed 8/9 echo showed EF 25%, cardiology consulted  Interim History / Subjective:   Received 2 doses of Ativan and was confused early morning Increased respiratory distress but hypoxia appears stable in fact on lower FiO2 40% / 30 L high flow nasal cannula Episodes of choking with pills, made n.p.o.  Objective   Blood pressure 129/84, pulse 96, temperature 98.2 F (36.8 C), temperature source Oral, resp.  rate (!) 23, height '5\' 1"'$  (1.549 m), weight 70.3 kg, SpO2 99 %.    FiO2 (%):  [40 %-50 %] 40 %   Intake/Output Summary (Last 24 hours) at 05/22/2021 1218 Last data filed at 05/22/2021 1212 Gross per 24 hour  Intake 2774.28 ml  Output 1550 ml  Net 1224.28 ml    Filed Weights   05/14/21 0444 05/15/21 0400 05/22/21 0350  Weight: 65.6 kg 66 kg 70.3 kg    Examination: Constitutional: frail elderly woman , no distress, on high flow nasal cannula Eyes: EOMI, pupils equal Ears, nose, mouth, and throat: MMM, trachea midline Cardiovascular: S1-S2 regular, sinus on monitor Respiratory: Decreased breath sounds on right, mild accessory muscle use, no rhonchi Gastrointestinal: soft, +BS Skin: No rashes, normal turgor, R chest incision site healing well Neurologic: moves all 4 ext, remains profoundly weak Psychiatric: Anxious affect   Chest x-ray 8/9 independently reviewed shows stable bibasilar opacities with patchy airspace disease in the right lung  ABG shows hypoxia, no hypercarbia Labs show stable mild hyponatremia, high BNP 2870 , mild leukocytosis  Resolved Hospital Problem list   Bleeding from chest tube, small hemothorax, clot and chest tube (improved)  AKI   Assessment & Plan:  Acute hypoxemic respiratory failure- secondary to volume overload, third spacing from poor nutrition w/ pleural effusions, interlobular septal thickening, new  acute PE.  Weakness and poor cough mechanics do not help.  --Continue IV heparin - Encourage IS -Tolerating HFNC, did not tolerate BiPAP  Ischemic cardiomyopathy, EF 25%  NSTEMI earlier in stay - - Push diuresis based on BNP as  renal function will tolerate -Once able to tolerate p.o., will need to start Entresto, beta-blocker then and Aldactone and SGLT2 inhibitors   Reactive Afib/ RVR , currently in sinus rhythm  -Lopressor 5 mg every 6 Post op primary esophageal repair for primary esophageal rupture with polymicrobial empyema  (05/05/21) -Continue ceftriaxone, Flagyl and anidulafungin -unclear duration -Await identification of yeast in pleural peel  Dysphagia - eso 8/1 did not show any leak Will need MBS once respiratory status improves  Profound muscular deconditioning and malnutrition- on TPN due to inability to keep up with nutritional demands -PT recommends CIR eventually   Low threshold to transfer to ICU if deterioration in respiratory status  Kara Mead MD. Shade Flood. [facility] Pager : [phone]  If no response to pager , please call [phone] until 7 pm After 7:00 pm call Elink  [phone]   05/22/2021

## 2021-05-22 NOTE — Progress Notes (Signed)
Patient very agitated tonight. Ativan 0.25 mg administered x2. Pt tolerated 1st dose well however it was not effective. After second dose patient became more agitated with confusion. MD notified and came to see patient. Hold all narcotics for now. MD to order speech consult tomorrow. No episodes ob bradycardia or a fib tonight. Fuller Canada, RN

## 2021-05-22 NOTE — Evaluation (Signed)
Clinical/Bedside Swallow Evaluation Patient Details  Name: Dana Coleman MRN: [id number] Date of Birth: 1940-11-07  Today's Date: 05/22/2021 Time: SLP Start Time (ACUTE ONLY): 1451 SLP Stop Time (ACUTE ONLY): [id number] SLP Time Calculation (min) (ACUTE ONLY): 41 min  Past Medical History:  Past Medical History:  Diagnosis Date   Allergy    occ uses OTC allergy meds    Arthritis    fingers   Breast cancer ([place]) 04/2008   Right breast   Cataracts, bilateral    removed bilat    Cervical cancer ([place])    When the patient was in her 21s   Depression    Diabetes mellitus without complication ([place])    Heart attack ([place]) 2004   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   Hyperlipidemia    Hypertension    Personal history of radiation therapy 2009   Past Surgical History:  Past Surgical History:  Procedure Laterality Date   BREAST LUMPECTOMY Right 2009   BREAST SURGERY  2009   right lumpectomy   [place] N/A 05/02/2021   Procedure: LAPAROSCOPIC CHOLECYSTECTOMY;  Surgeon: Greer Pickerel, MD;  Location: [facility] ORS;  Service: General;  Laterality: N/A;   COLONOSCOPY     LEFT HEART CATHETERIZATION WITH CORONARY ANGIOGRAM N/A 04/20/2014   Procedure: LEFT HEART CATHETERIZATION WITH CORONARY ANGIOGRAM;  Surgeon: Jacolyn Reedy, MD;  Location: [facility] CATH LAB;  Service: Cardiovascular;  Laterality: N/A;   POLYPECTOMY     STENT PLACEMENT VASCULAR ([place] HX)     THORACOTOMY Right 05/05/2021   Procedure: THORACOTOMY MAJOR REPAIR PERFORATED ESOPHAGUS;  Surgeon: Gaye Pollack, MD;  Location: [facility] OR;  Service: Thoracic;  Laterality: Right;   thyroid     for nodule hemithyroidectomy, benign   THYROIDECTOMY Right 03/2003   HPI:  Pt is a 80 yo female admitted on 05/04/21 for AMS. HX of cholecystectomy on 7/21.  Intubated upon admission, 7/24 Started CRRT, Esophageal perforation with septic shock post flap repair, drainage of empyema and decortication of R lung with chest tube placed.  7/25  EKG changes and probable nstemi. Extubated 7/26. Esophagram 8/1 cleared to start PO diet. CT removed 8/4. Pt with concern for aspiration 8/8 and made NPO. Pt also with new PE with R heart strain and increased O2 HFNC requirements 8/8. PMH includes: R breast CA, DM, CAD, HTN.   Assessment / Plan / Recommendation Clinical Impression  Pt denies any h/o trouble swallowing PTA except for needing to take medication each morning for reflux. She believes she was doing well with POs this admission as well until meds on 8/8 in the morning. RN reported coughing and regurgitation of broken pill and pudding; pt says she did not get anything cleared from her mouth because it was too dry, and that she just spit it all out. Pt is requiring increased O2 needs today and generally appears to be quite deconditioned. She tried a small amount of POs before declining anything else, with swallow appearing swift but with intermittent throat clearing noted especially with thin liquids.  Agree with pulmonology note that pt would likely benefit from MBS prior to restarting PO diet, but she is currently on too high of oxygen needs to be able to transport to radiology for completion. A little time to improve her respiratory status and perhaps her strength may also be beneficial. Recommend allowing small amounts of ice chips after oral care and meds crushed in puree. SLP will f/u to see about timing of MBS  as her respiratory status improves. Pt and family members are in agreement with this plan.  SLP Visit Diagnosis: Dysphagia, unspecified (R13.10)    Aspiration Risk  Mild aspiration risk;Moderate aspiration risk    Diet Recommendation NPO except meds;Ice chips PRN after oral care   Medication Administration: Crushed with puree (provide ice chip first, then meds crushed in room temperature applesauce)    Other  Recommendations Oral Care Recommendations: Oral care QID   Follow up Recommendations  (tba)      Frequency and  Duration min 2x/week  2 weeks       Prognosis Prognosis for Safe Diet Advancement: Good      Swallow Study   General HPI: Pt is a 80 yo female admitted on 05/04/21 for AMS. HX of cholecystectomy on 7/21.  Intubated upon admission, 7/24 Started CRRT, Esophageal perforation with septic shock post flap repair, drainage of empyema and decortication of R lung with chest tube placed.  7/25 EKG changes and probable nstemi. Extubated 7/26. Esophagram 8/1 cleared to start PO diet. CT removed 8/4. Pt with concern for aspiration 8/8 and made NPO. Pt also with new PE with R heart strain and increased O2 HFNC requirements 8/8. PMH includes: R breast CA, DM, CAD, HTN. Type of Study: Bedside Swallow Evaluation Previous Swallow Assessment: none in chart Diet Prior to this Study: NPO;TNA Temperature Spikes Noted: No Respiratory Status: Nasal cannula (30L HFNC) History of Recent Intubation: Yes Length of Intubations (days): 5 days Date extubated: 05/07/21 Behavior/Cognition: Alert;Cooperative Oral Cavity Assessment: Within Functional Limits Oral Care Completed by SLP: No Oral Cavity - Dentition: Edentulous (dentures in room but not placed for eval) Vision: Functional for self-feeding Self-Feeding Abilities: Able to feed self Patient Positioning: Upright in bed Baseline Vocal Quality: Low vocal intensity Volitional Cough: Weak Volitional Swallow: Able to elicit    Oral/Motor/Sensory Function Overall Oral Motor/Sensory Function: Generalized oral weakness   Ice Chips Ice chips: Within functional limits Presentation: Spoon   Thin Liquid Thin Liquid: Impaired Presentation: Cup;Self Fed;Spoon;Straw Pharyngeal  Phase Impairments: Cough - Immediate;Cough - Delayed    Nectar Thick Nectar Thick Liquid: Not tested   Honey Thick Honey Thick Liquid: Not tested   Puree Puree: Impaired Presentation: Spoon;Self Fed Pharyngeal Phase Impairments: Throat Clearing - Delayed   Solid     Solid: Not tested       Osie Bond., M.A. [place] [facility] Pager [phone] Office [phone]  05/22/2021,4:08 PM

## 2021-05-22 NOTE — Progress Notes (Signed)
17 Days Post-Op Procedure(s) (LRB): THORACOTOMY MAJOR REPAIR PERFORATED ESOPHAGUS (Right) Subjective: Feels SOB, worse at night  Objective: Vital signs in last 24 hours: Temp:  [97.5 F (36.4 C)-98.5 F (36.9 C)] 98.3 F (36.8 C) (08/10 0500) Pulse Rate:  [84-98] 93 (08/10 0726) Cardiac Rhythm: Heart block;Bundle branch block (08/09 1905) Resp:  [18-37] 26 (08/10 0726) BP: (108-145)/(75-103) 130/84 (08/10 0726) SpO2:  [94 %-100 %] 100 % (08/10 0726) FiO2 (%):  [40 %-50 %] 40 % (08/10 0726) Weight:  [70.3 kg] 70.3 kg (08/10 0350)  Hemodynamic parameters for last 24 hours:    Intake/Output from previous day: 08/09 0701 - 08/10 0700 In: 2774.3 [I.V.:1796.5; IV Piggyback:977.8] Out: 1450 [Urine:1450] Intake/Output this shift: No intake/output data recorded.  General appearance: alert, cooperative, and no distress Heart: regular rate and rhythm Lungs: minimal basilar crackles Abdomen: soft, non tender Extremities: no edema Wound: incis healing   Lab Results: Recent Labs    05/22/21 0224 05/22/21 0554  WBC 9.5 11.6*  HGB 9.2* 9.9*  HCT 28.1* 30.5*  PLT 327 382   BMET:  Recent Labs    05/21/21 0352 05/22/21 0554  NA 134* 134*  K 3.1* 3.8  CL 101 103  CO2 22 20*  GLUCOSE 216* 175*  BUN 31* 34*  CREATININE 0.79 0.76  CALCIUM 8.3* 8.4*    PT/INR: No results for input(s): LABPROT, INR in the last 72 hours. ABG    Component Value Date/Time   PHART 7.417 05/20/2021 0358   HCO3 15.1 (L) 05/20/2021 0358   TCO2 29 05/07/2021 0516   ACIDBASEDEF 8.6 (H) 05/20/2021 0358   O2SAT 90.3 05/20/2021 0358   CBG (last 3)  Recent Labs    05/21/21 2025 05/21/21 2349 05/22/21 0330  GLUCAP 199* 169* 174*    Meds Scheduled Meds:  sodium chloride   Intravenous Once   acetaminophen  650 mg Rectal Q4H   chlorhexidine  15 mL Mouth Rinse BID   Chlorhexidine Gluconate Cloth  6 each Topical Daily   feeding supplement  237 mL Oral BID BM   furosemide  40 mg  Intravenous Once   Gerhardt's butt cream  1 application Topical QID   insulin aspart  0-24 Units Subcutaneous Q4H   lidocaine  1 patch Transdermal Q24H   LORazepam       mouth rinse  15 mL Mouth Rinse q12n4p   metoprolol tartrate  5 mg Intravenous Q6H   pantoprazole (PROTONIX) IV  40 mg Intravenous QHS   sodium chloride flush  10-40 mL Intracatheter Q12H   Continuous Infusions:  sodium chloride 10 mL/hr at 05/22/21 0300   anidulafungin Stopped (05/21/21 1401)   cefTRIAXone (ROCEPHIN)  IV Stopped (05/21/21 1234)   heparin 1,550 Units/hr (05/22/21 0300)   metronidazole 500 mg ([id number])   TPN CYCLIC-ADULT (ION) 123456 mL/hr at 05/22/21 0300   PRN Meds:.sodium chloride, fentaNYL (SUBLIMAZE) injection, ondansetron (ZOFRAN) IV, sodium chloride, sodium chloride flush  Xrays DG Chest Portable 1 View  Result Date: 05/22/2021 CLINICAL DATA:  Dyspnea. History of diabetes, hypertension, and myocardial infarction. EXAM: PORTABLE CHEST 1 VIEW COMPARISON:  05/21/2021 FINDINGS: The patient is rotated to the right on today's radiograph, reducing diagnostic sensitivity and specificity. Left central line tip: SVC. Skin staples project over the right lateral hemithorax, history of thoracotomy on 05/05/2021 for esophageal repair. Continued pleural thickening on the right Atherosclerotic calcification of the aortic arch. Stable mild to moderate cardiomegaly. Stable small left pleural effusion. Stable asymmetric interstitial accentuation in the  right lung. Stable airspace opacity in the right lower lobe. IMPRESSION: 1. Stable appearance of the chest, including bibasilar airspace opacities, right pleural thickening, asymmetric right interstitial accentuation, and postoperative findings related to prior right thoracotomy. 2.  Aortic Atherosclerosis (ICD10-I70.0). Electronically Signed   By: Van Clines M.D.   On: 05/22/2021 07:22   DG CHEST PORT 1 VIEW  Result Date: 05/21/2021 CLINICAL DATA:  Shortness of  breath, known pulmonary emboli. EXAM: PORTABLE CHEST 1 VIEW COMPARISON:  05/19/2021 FINDINGS: Cardiac shadow remains enlarged. Left-sided PICC line is stable. Postsurgical changes in the right chest wall are again noted. Stable bibasilar opacities are seen. Patchy airspace disease is noted within the right lung as well. IMPRESSION: Stable appearance of the chest when compared with the prior exam. Electronically Signed   By: Inez Catalina M.D.   On: 05/21/2021 02:24   VAS [place] LOWER EXTREMITY VENOUS (DVT)  Result Date: 05/21/2021  Lower Venous DVT Study Patient Name:  Dana Coleman  Date of Exam:   05/21/2021 Medical Rec #: [id number]           Accession #:    [id number] Date of Birth: 1941-01-03            Patient Gender: F Patient Age:   80 years Exam Location:  [facility] Procedure:      VAS [place] LOWER EXTREMITY VENOUS (DVT) Referring Phys: [facility] --------------------------------------------------------------------------------  Indications: Pulmonary embolism.  Risk Factors: None identified. Limitations: Poor ultrasound/tissue interface. Comparison Study: No prior studies. Performing Technologist: Oliver Hum RVT  Examination Guidelines: A complete evaluation includes B-mode imaging, spectral Doppler, color Doppler, and power Doppler as needed of all accessible portions of each vessel. Bilateral testing is considered an integral part of a complete examination. Limited examinations for reoccurring indications may be performed as noted. The reflux portion of the exam is performed with the patient in reverse Trendelenburg.  +---------+---------------+---------+-----------+----------+--------------+ RIGHT    CompressibilityPhasicitySpontaneityPropertiesThrombus Aging +---------+---------------+---------+-----------+----------+--------------+ CFV      Full           Yes      Yes                                 +---------+---------------+---------+-----------+----------+--------------+ SFJ       Full                                                        +---------+---------------+---------+-----------+----------+--------------+ FV Prox  Full                                                        +---------+---------------+---------+-----------+----------+--------------+ FV Mid   Full                                                        +---------+---------------+---------+-----------+----------+--------------+ FV DistalFull                                                        +---------+---------------+---------+-----------+----------+--------------+  PFV      Full                                                        +---------+---------------+---------+-----------+----------+--------------+ POP      Full           Yes      Yes                                 +---------+---------------+---------+-----------+----------+--------------+ PTV      Full                                                        +---------+---------------+---------+-----------+----------+--------------+ PERO     Full                                                        +---------+---------------+---------+-----------+----------+--------------+   +---------+---------------+---------+-----------+----------+--------------+ LEFT     CompressibilityPhasicitySpontaneityPropertiesThrombus Aging +---------+---------------+---------+-----------+----------+--------------+ CFV      Full           Yes      Yes                                 +---------+---------------+---------+-----------+----------+--------------+ SFJ      Full                                                        +---------+---------------+---------+-----------+----------+--------------+ FV Prox  Full                                                        +---------+---------------+---------+-----------+----------+--------------+ FV Mid                  Yes      Yes                                  +---------+---------------+---------+-----------+----------+--------------+ FV DistalFull                                                        +---------+---------------+---------+-----------+----------+--------------+ PFV      Full                                                        +---------+---------------+---------+-----------+----------+--------------+  POP      Full           Yes      Yes                                 +---------+---------------+---------+-----------+----------+--------------+ PTV      Full                                                        +---------+---------------+---------+-----------+----------+--------------+ PERO     Full                                                        +---------+---------------+---------+-----------+----------+--------------+     Summary: RIGHT: - There is no evidence of deep vein thrombosis in the lower extremity. However, portions of this examination were limited- see technologist comments above.  - No cystic structure found in the popliteal fossa.  LEFT: - There is no evidence of deep vein thrombosis in the lower extremity. However, portions of this examination were limited- see technologist comments above.  - No cystic structure found in the popliteal fossa.  *See table(s) above for measurements and observations. Electronically signed by Ruta Hinds MD on 05/21/2021 at 5:30:46 PM.    Final    ECHOCARDIOGRAM LIMITED  Result Date: 05/21/2021    ECHOCARDIOGRAM LIMITED REPORT   Patient Name:   Dana Coleman Date of Exam: 05/21/2021 Medical Rec #:  [id number]          Height:       61.0 in Accession #:    [id number]         Weight:       145.5 lb Date of Birth:  [birth year]           BSA:          1.650 m Patient Age:    80 years           BP:           124/77 mmHg Patient Gender: F                  HR:           100 bpm. Exam Location:  Inpatient Procedure: Limited Echo, Cardiac Doppler,  Limited Color Doppler and Intracardiac            Opacification Agent                   STAT ECHO Reported to: Dr Marlou Porch on 05/20/2021 8:20:00 AM                  Stat order. Indications:    Dyspnea  History:        Patient has prior history of Echocardiogram examinations, most                 recent 05/06/2021. Previous Myocardial Infarction; Risk                 Factors:Diabetes, Dyslipidemia and Hypertension.  Sonographer:    Georganna Skeans RDCS Referring Phys: [id number] [place]  1. Left ventricular ejection fraction, by estimation, is 25 to 30%. The left ventricle has severely decreased function. The left ventricle demonstrates regional wall motion abnormalities (see scoring diagram/findings for description). There is mild left  ventricular hypertrophy.  2. Right ventricular systolic function is normal. The right ventricular size is normal. There is mildly elevated pulmonary artery systolic pressure. The estimated right ventricular systolic pressure is [id number] mmHg.  3. The mitral valve is normal in structure. Mild mitral valve regurgitation. No evidence of mitral stenosis.  4. Tricuspid valve regurgitation is moderate.  5. The aortic valve is tricuspid. Aortic valve regurgitation is not visualized. Mild aortic valve sclerosis is present, with no evidence of aortic valve stenosis.  6. The inferior vena cava is normal in size with greater than 50% respiratory variability, suggesting right atrial pressure of 3 mmHg. Comparison(s): Prior images unable to be directly viewed, comparison made by report only. The left ventricular function is worsened. FINDINGS  Left Ventricle: Left ventricular ejection fraction, by estimation, is 25 to 30%. The left ventricle has severely decreased function. The left ventricle demonstrates regional wall motion abnormalities. Definity contrast agent was given IV to delineate the left ventricular endocardial borders. The left ventricular internal cavity size was normal in size.  There is mild left ventricular hypertrophy.  LV Wall Scoring: The mid and distal anterior septum, entire apex, mid and distal inferior wall, and mid inferoseptal segment are akinetic. Right Ventricle: The right ventricular size is normal. No increase in right ventricular wall thickness. Right ventricular systolic function is normal. There is mildly elevated pulmonary artery systolic pressure. The tricuspid regurgitant velocity is 2.94  m/s, and with an assumed right atrial pressure of 8 mmHg, the estimated right ventricular systolic pressure is [id number] mmHg. Left Atrium: Left atrial size was normal in size. Right Atrium: Right atrial size was normal in size. Pericardium: There is no evidence of pericardial effusion. Mitral Valve: The mitral valve is normal in structure. Mild mitral valve regurgitation. No evidence of mitral valve stenosis. Tricuspid Valve: The tricuspid valve is normal in structure. Tricuspid valve regurgitation is moderate . No evidence of tricuspid stenosis. Aortic Valve: The aortic valve is tricuspid. Aortic valve regurgitation is not visualized. Aortic regurgitation PHT measures 774 msec. Mild aortic valve sclerosis is present, with no evidence of aortic valve stenosis. Aortic valve mean gradient measures 5.0 mmHg. Aortic valve peak gradient measures 11.0 mmHg. Pulmonic Valve: The pulmonic valve was normal in structure. Pulmonic valve regurgitation is trivial. No evidence of pulmonic stenosis. Aorta: The aortic root is normal in size and structure. Venous: The inferior vena cava is normal in size with greater than 50% respiratory variability, suggesting right atrial pressure of 3 mmHg. IAS/Shunts: No atrial level shunt detected by color flow Doppler. LEFT VENTRICLE PLAX 2D LVIDd:         4.70 cm LVIDs:         3.70 cm LV PW:         0.70 cm LV IVS:        1.30 cm LVOT diam:     2.10 cm LVOT Area:     3.46 cm  LV Volumes (MOD) LV vol d, MOD A2C: 81.4 ml LV vol d, MOD A4C: 65.0 ml LV vol s, MOD A2C:  51.0 ml LV vol s, MOD A4C: 46.9 ml LV SV MOD A2C:     30.4 ml LV SV MOD A4C:     65.0 ml LV SV MOD BP:      26.5  ml RIGHT VENTRICLE RV Basal diam:  3.50 cm RV Mid diam:    2.10 cm RV S prime:     7.18 cm/s TAPSE (M-mode): 0.7 cm LEFT ATRIUM             Index       RIGHT ATRIUM           Index LA Vol (A2C):   53.5 ml 32.43 ml/m RA Area:     12.70 cm LA Vol (A4C):   30.9 ml 18.73 ml/m RA Volume:   28.70 ml  17.39 ml/m LA Biplane Vol: 41.8 ml 25.33 ml/m  AORTIC VALVE               PULMONIC VALVE AV Vmax:      166.00 cm/s  PV Vmax:       1.02 m/s AV Vmean:     101.000 cm/s PV Vmean:      68.100 cm/s AV VTI:       0.223 m      PV VTI:        0.174 m AV Peak Grad: 11.0 mmHg    PV Peak grad:  4.2 mmHg AV Mean Grad: 5.0 mmHg     PV Mean grad:  2.0 mmHg AI PHT:       774 msec  AORTA Ao Root diam: 3.80 cm Ao Asc diam:  3.80 cm MR Peak grad: 70.9 mmHg   TRICUSPID VALVE MR Vmax:      421.00 cm/s TR Peak grad:   34.6 mmHg                           TR Vmax:        294.00 cm/s                            SHUNTS                           Systemic Diam: 2.10 cm Candee Furbish MD Electronically signed by Candee Furbish MD Signature Date/Time: 05/21/2021/11:06:49 AM    Final     Assessment/Plan: S/P Procedure(s) (LRB): THORACOTOMY MAJOR REPAIR PERFORATED ESOPHAGUS (Right)    1 afeb, VSS, exxcept Increased RR at times, sBP 100's-140's range, Bifascicular block on EKG - non STEMI earlier in stay  2 on HFNC 30 liters/40% fiO2 3 good UOP, normal renal fxn 4 slightly increased leukocytosis trend 5 H/H trend improved 6 conts heparin gtt per pharmacy dosing for afib/PE 7 CXR remains stable IMPRESSION: A. Stable appearance of the chest, including bibasilar airspace opacities, right pleural thickening, asymmetric right interstitial accentuation, and postoperative findings related to prior right thoracotomy. B  Aortic Atherosclerosis (ICD10-I70.0). 9 primary management per medicine team/CCM   LOS: 18 days    John Giovanni  PA-C Pager [phone] 05/22/2021

## 2021-05-22 NOTE — Progress Notes (Signed)
This chaplain responded to PMT consult for spiritual care.  The chaplain introduced herself and joined the Pt. family (2 siblings and 2 grandchildren) at the Pt. bedside.  The Pt. shares she is not experiencing any pain at the time of the visit.  The chaplain listens as the family reflects on the joining of the Pt. love of choir music and her [place]. The family accepted the chaplain's invitation for prayer and F/U spiritual care.

## 2021-05-22 NOTE — Progress Notes (Signed)
Family Medicine Teaching Service Daily Progress Note Intern Pager: [phone]  Patient name: Dana Coleman Medical record number: [id number] Date of birth: April 07, 1941 Age: 80 y.o. Gender: female  Primary Care Provider: Gifford Shave, MD Consultants: CCM, CVTS, cardiology, general surgery (s/o), nephrology (s/o) Code Status: Full  Pt Overview and Major Events to Date:  7/23: right pigtail catheter placed, admitted to ICU, intubated 7/24: CRRT, emergent right thoracotomy, repair of esophageal perforation with intercostal pedicled muscle flap and drainage of empyema and decortication of right lung 7/25: probable, inferior NSTEMI, no acute intervention. Echo with mildly reduced LV, large RV 7/26: extubated, double-lumen PICC placed, TPN started 8/1: Esophagram without leak, started on PO, heparin gtt stopped to increased bleeding from chest tubes. Declotted by CVTS. Transfused 1u pRBC 8/3: anterior chest tube removed 8/4: remaining chest tube removed 8/8: increased O2 requirement, Afib RVR -> PE with right heart strain 8/9: further increased O2 requirement, Afib  Abx timeline Zosyn: 7/23-7/26 Eraxis: 7/23- Unasyn: 7/26-7/31 Meropenem: 7/31-8/3 Ceftriaxone: 8/3- Metronidazole: 8/3-  Micro Timeline 7/23: MRSA PCR negative 7/23: pleural fluid grew strep salivarius pan-sensitive, strep mitas (sensitive CTX), rare candida albicans 7/24: blood cultures negative x2 7/30: blood cultures negative x2  Assessment and Plan: Dana Coleman is a 80 year old female who was admitted on 7/23 for AMS and shortness of breath, subsequently found to have acute renal failure, esophageal perforation now POD #16 from repair.  Admission this for also complicated by probable inferior NSTEMI, acute blood loss anemia requiring 1 unit PRBC, atrial fibrillation A. fib with RVR with consequent PE with right heart strain.  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  Esophageal perforation s/p repair  empyema POD #17, patient is still feeling discomfort in right side of back. WBC today 11.6.  As tomorrow is likely last day of antibiotics, will have discussion further tomorrow with team as patient's clinical status continues to remain unimproved. -Continue Eraxis '100mg'$  daily, Metronidazole '500mg'$  twice daily, Ceftriaxone 2g daily -Antibiotic course to be completed on 8/11 -Continue Tylenol '650mg'$  q4h PRN -Continue oxycodone 5-'10mg'$  q6h PRN  A. fib with RVR  PE with right heart strain  hypoxia On HFNC 30 L 40% FiO2. Continuing to worsen over night time periods. CXR unchanged from previously. Cardiology signing off with recommendations to start Entresto, carvedilol aldactone and SGLT2 inhibitor once able to tolerate mouth. -Cardiology signed off -CVTS and CCM continue to follow, appreciate recommendations -CCM notes low threshold for ICU transfer if deterioration in respiratory status occurs -Palliative care to see patient today  Severe protein malnutrition  deconditioned state N.p.o. at this time continue TPN feeds -Continue to pause ambulation with PT/OT  FEN/GI: N.p.o., TPN PPx: 1550 units/h IV Dispo:CIR pending clinical improvement . Barriers include clinical status poor.   Subjective:  Patient is again minimally able to have conversation she is uncomfortable in her breathing.  Denies any pain with breathing but does not feel she is able to catch her breath.  She is fatigued as she slept very minimally through the night.  Objective: Temp:  [97.5 F (36.4 C)-98.5 F (36.9 C)] 98.5 F (36.9 C) (08/10 0800) Pulse Rate:  [84-98] 96 (08/10 0800) Resp:  [18-37] 22 (08/10 0800) BP: (108-145)/(73-103) 118/73 (08/10 0800) SpO2:  [94 %-100 %] 100 % (08/10 0800) FiO2 (%):  [40 %-50 %] 40 % (08/10 0726) Weight:  [70.3 kg] 70.3 kg (08/10 0350) Physical Exam: General: Uncomfortable and tired  appearing, minimally conversational due to clinical status Cardiovascular: RRR, no murmurs auscultated Respiratory: Diminished lung sounds in the right base, no wheezing or crackles Abdomen: Soft, nontender, nondistended Extremities: 2+ radial pulses and tibialis pulses bilaterally, trace/minimal pitting edema lower extremities  Laboratory: Recent Labs  Lab 05/21/21 0352 05/22/21 0224 05/22/21 0554  WBC 13.0* 9.5 11.6*  HGB 9.5* 9.2* 9.9*  HCT 28.4* 28.1* 30.5*  PLT 357 327 382   Recent Labs  Lab 05/20/21 0053 05/21/21 0352 05/22/21 0554  NA 132* 134* 134*  K 3.9 3.1* 3.8  CL 104 101 103  CO2 20* 22 20*  BUN 25* 31* 34*  CREATININE 0.76 0.79 0.76  CALCIUM 8.2* 8.3* 8.4*  PROT 5.7* 6.4* 6.2*  BILITOT 0.3 0.4 0.6  ALKPHOS 83 72 70  ALT 61* 43 37  AST 29 30 32  GLUCOSE 234* 216* 175*  Mag: 2.1  ABG    Component Value Date/Time   PHART 7.420 05/22/2021 0735   PCO2ART 31.7 (L) 05/22/2021 0735   PO2ART 65.4 (L) 05/22/2021 0735   HCO3 20.2 05/22/2021 0735   TCO2 29 05/07/2021 0516   ACIDBASEDEF 3.5 (H) 05/22/2021 0735   O2SAT 91.1 05/22/2021 0735    Imaging/Diagnostic Tests: DG chest portable 1 view:Bibasilar airspace opacities, right pleural thickening  Wells Guiles, DO 05/22/2021, 9:41 AM PGY-1, [place] Intern pager: [phone], text pages welcome

## 2021-05-22 NOTE — Progress Notes (Signed)
Physical Therapy Treatment Patient Details Name: Dana Coleman MRN: [id number] DOB: 1941-06-30 Today's Date: 05/22/2021    History of Present Illness Pt is a 80 yo female admitted on 05/04/21 for AMS. HX of cholecystectomy on 7/21.  Intubated upon admission, 7/24 Started CRRT, Esophageal perforation with septic shock post flap repair, drainage of empyema and decortication of R lung with chest tube placed.  7/25 EKG changes and probable nstemi. Extubated 7/26. CT removed 8/4. Pt with new PE with R heart strain and increased O2 HFNC requirements 8/8. PMH includes: R breast CA, DM, CAD, HTN.    PT Comments    Pt's session limited presently post new PE on 30L HFNC at 40% FiO2.  VSS generally, but RR starts increasing with minimal exercise in the bed.  Emphasis on bed level AROM/resistive exercise to U and LE's in lieu of OOB activity again today.    Follow Up Recommendations  CIR;Other (comment) (Not medically ready presently, but will continue to work toward [employer])     Equipment Recommendations  None recommended by PT    Recommendations for Other Services       Precautions / Restrictions Precautions Precautions: Fall Precaution Comments: watch RR, HFNC/increased O2 requirements s/p PE Restrictions Weight Bearing Restrictions: No    Mobility  Bed Mobility               General bed mobility comments: RN deferred OOB session due to pt increased WOB/O2 saturation needs    Transfers                    Ambulation/Gait                 Stairs             Wheelchair Mobility    Modified Rankin (Stroke Patients Only)       Balance                                            Cognition Arousal/Alertness: Awake/alert Behavior During Therapy: WFL for tasks assessed/performed;Flat affect Overall Cognitive Status:  (NT formally)                         Following Commands: Follows one step commands consistently    Awareness: Emergent Problem Solving: Slow processing        Exercises General Exercises - Lower Extremity Ankle Circles/Pumps: AROM;Both;20 reps;Supine Heel Slides: AROM;AAROM;Both;10 reps;Supine;Other (comment) (graded resistance where able in gross extention) Hip ABduction/ADduction: AROM;Both;10 reps;Supine Other Exercises Other Exercises: non isolated bicep/tricep presses with graded resistance x10 reps, several reach toward the ceiling    General Comments General comments (skin integrity, edema, etc.): Pt continues on HHFNC 30 L at 40% FiO2  SpO2 at 99% and consistent HR's between 96-99 bpm.  RR upper 20's/low 30's with exercise.      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." PT Goal Formulation: With patient Time For Goal Achievement: 05/31/21 Potential to Achieve Goals: Fair Progress towards PT goals: Not progressing toward goals - comment (limited sessions since new PE.)    Frequency    Min 3X/week      PT Plan Current plan remains appropriate    Co-evaluation              AM-PAC PT "6 Clicks" Mobility   Outcome Measure  Help needed turning from your back to your side while in a flat bed without using bedrails?: A Lot Help needed moving from lying on your back to sitting on the side of a flat bed without using bedrails?: A Lot Help needed moving to and from a bed to a chair (including a wheelchair)?: Total Help needed standing up from a chair using your arms (e.g., wheelchair or bedside chair)?: Total Help needed to walk in hospital room?:  Total Help needed climbing 3-5 steps with a railing? : Total 6 Click Score: 8    End of Session Equipment Utilized During Treatment: Oxygen Activity Tolerance: Patient limited by fatigue;Treatment limited secondary to medical complications (Comment) Patient left: in bed;with call bell/phone within reach;with family/visitor present;with SCD's reapplied Nurse Communication: Mobility status PT Visit Diagnosis: Other abnormalities of gait and mobility (R26.89);Difficulty in walking, not elsewhere classified (R26.2)     Time: 1040-1101 PT Time Calculation (min) (ACUTE ONLY): 21 min  Charges:  $Therapeutic Exercise: 8-22 mins                     05/22/2021  Ginger Carne., PT Acute Rehabilitation Services [phone]  (pager) [phone]  (office)   Tessie Fass Naliya Gish 05/22/2021, 11:16 AM

## 2021-05-22 NOTE — Progress Notes (Signed)
Interim Progress Note  Advised by palliative NP that patient's family had questions regarding what caused esophageal perforation.  In to see the patient and talk with her family.  Her sister and brother were at bedside.  They were confused about what events may have caused the patient to be fine the day of her surgery and then become so unstable 2 days later.  Also wanted to know what caused the esophageal perforation.  I told her that I was uncertain about what may have caused it.  It was unlikely to be caused from the surgery.  They also had concerns and questions about why her incisions were larger than they were expecting.  I told them that unfortunately I am unable to answer those questions as I was not present for, nor, performed the surgeries. We discussed some of her active and ongoing problems such as her oxygen requirement, her pulmonary emboli and her feedings. We discussed that she is being recommended for CIR but unfortunately not a candidate at this time given her oxygen requirement.   Family states that son and daughter will be arriving tomorrow and will come to visit patient in the afternoon around 1PM. Requested that the team discuss with them and answer any remaining questions they may have. We'll make sure someone from our team talks with them. They were appreciative of the visit.   Ms. Kovalcik appeared in good spirits when I saw her. Family also notes she has been more talkative today.

## 2021-05-22 NOTE — Progress Notes (Signed)
Subjective:  Patient denies any chest pain or palpitations.  Remains in sinus rhythm.  Complains of feeling weak, tired and short of breath.  Objective:  Vital Signs in the last 24 hours: Temp:  [97.5 F (36.4 C)-98.5 F (36.9 C)] 98.5 F (36.9 C) (08/10 0800) Pulse Rate:  [84-98] 96 (08/10 0800) Resp:  [18-35] 22 (08/10 0800) BP: (108-130)/(73-88) 118/73 (08/10 0800) SpO2:  [94 %-100 %] 100 % (08/10 0800) FiO2 (%):  [40 %-50 %] 40 % (08/10 0726) Weight:  [70.3 kg] 70.3 kg (08/10 0350)  Intake/Output from previous day: 08/09 0701 - 08/10 0700 In: 2774.3 [I.V.:1796.5; IV Piggyback:977.8] Out: 1450 [Urine:1450] Intake/Output from this shift: No intake/output data recorded.  Physical Exam: unchanged  Lab Results: Recent Labs    05/22/21 0224 05/22/21 0554  WBC 9.5 11.6*  HGB 9.2* 9.9*  PLT 327 382   Recent Labs    05/21/21 0352 05/22/21 0554  NA 134* 134*  K 3.1* 3.8  CL 101 103  CO2 22 20*  GLUCOSE 216* 175*  BUN 31* 34*  CREATININE 0.79 0.76   No results for input(s): TROPONINI in the last 72 hours.  Invalid input(s): CK, MB Hepatic Function Panel Recent Labs    05/22/21 0554  PROT 6.2*  ALBUMIN 2.0*  AST 32  ALT 37  ALKPHOS 70  BILITOT 0.6   No results for input(s): CHOL in the last 72 hours. No results for input(s): PROTIME in the last 72 hours.  Imaging: DG Chest Portable 1 View  Result Date: 05/22/2021 CLINICAL DATA:  Dyspnea. History of diabetes, hypertension, and myocardial infarction. EXAM: PORTABLE CHEST 1 VIEW COMPARISON:  05/21/2021 FINDINGS: The patient is rotated to the right on today's radiograph, reducing diagnostic sensitivity and specificity. Left central line tip: SVC. Skin staples project over the right lateral hemithorax, history of thoracotomy on 05/05/2021 for esophageal repair. Continued pleural thickening on the right Atherosclerotic calcification of the aortic arch. Stable mild to moderate cardiomegaly. Stable small left  pleural effusion. Stable asymmetric interstitial accentuation in the right lung. Stable airspace opacity in the right lower lobe. IMPRESSION: 1. Stable appearance of the chest, including bibasilar airspace opacities, right pleural thickening, asymmetric right interstitial accentuation, and postoperative findings related to prior right thoracotomy. 2.  Aortic Atherosclerosis (ICD10-I70.0). Electronically Signed   By: Van Clines M.D.   On: 05/22/2021 07:22   DG CHEST PORT 1 VIEW  Result Date: 05/21/2021 CLINICAL DATA:  Shortness of breath, known pulmonary emboli. EXAM: PORTABLE CHEST 1 VIEW COMPARISON:  05/19/2021 FINDINGS: Cardiac shadow remains enlarged. Left-sided PICC line is stable. Postsurgical changes in the right chest wall are again noted. Stable bibasilar opacities are seen. Patchy airspace disease is noted within the right lung as well. IMPRESSION: Stable appearance of the chest when compared with the prior exam. Electronically Signed   By: Inez Catalina M.D.   On: 05/21/2021 02:24   VAS [place] LOWER EXTREMITY VENOUS (DVT)  Result Date: 05/21/2021  Lower Venous DVT Study Patient Name:  Dana Coleman  Date of Exam:   05/21/2021 Medical Rec #: [id number]           Accession #:    [id number] Date of Birth: 1940-12-03            Patient Gender: F Patient Age:   80 years Exam Location:  [facility] Procedure:      VAS [place] LOWER EXTREMITY VENOUS (DVT) Referring Phys: [facility] ALVA --------------------------------------------------------------------------------  Indications: Pulmonary embolism.  Risk Factors: None  identified. Limitations: Poor ultrasound/tissue interface. Comparison Study: No prior studies. Performing Technologist: Oliver Hum RVT  Examination Guidelines: A complete evaluation includes B-mode imaging, spectral Doppler, color Doppler, and power Doppler as needed of all accessible portions of each vessel. Bilateral testing is considered an integral part of a complete examination.  Limited examinations for reoccurring indications may be performed as noted. The reflux portion of the exam is performed with the patient in reverse Trendelenburg.  +---------+---------------+---------+-----------+----------+--------------+ RIGHT    CompressibilityPhasicitySpontaneityPropertiesThrombus Aging +---------+---------------+---------+-----------+----------+--------------+ CFV      Full           Yes      Yes                                 +---------+---------------+---------+-----------+----------+--------------+ SFJ      Full                                                        +---------+---------------+---------+-----------+----------+--------------+ FV Prox  Full                                                        +---------+---------------+---------+-----------+----------+--------------+ FV Mid   Full                                                        +---------+---------------+---------+-----------+----------+--------------+ FV DistalFull                                                        +---------+---------------+---------+-----------+----------+--------------+ PFV      Full                                                        +---------+---------------+---------+-----------+----------+--------------+ POP      Full           Yes      Yes                                 +---------+---------------+---------+-----------+----------+--------------+ PTV      Full                                                        +---------+---------------+---------+-----------+----------+--------------+ PERO     Full                                                        +---------+---------------+---------+-----------+----------+--------------+   +---------+---------------+---------+-----------+----------+--------------+  LEFT     CompressibilityPhasicitySpontaneityPropertiesThrombus Aging  +---------+---------------+---------+-----------+----------+--------------+ CFV      Full           Yes      Yes                                 +---------+---------------+---------+-----------+----------+--------------+ SFJ      Full                                                        +---------+---------------+---------+-----------+----------+--------------+ FV Prox  Full                                                        +---------+---------------+---------+-----------+----------+--------------+ FV Mid                  Yes      Yes                                 +---------+---------------+---------+-----------+----------+--------------+ FV DistalFull                                                        +---------+---------------+---------+-----------+----------+--------------+ PFV      Full                                                        +---------+---------------+---------+-----------+----------+--------------+ POP      Full           Yes      Yes                                 +---------+---------------+---------+-----------+----------+--------------+ PTV      Full                                                        +---------+---------------+---------+-----------+----------+--------------+ PERO     Full                                                        +---------+---------------+---------+-----------+----------+--------------+     Summary: RIGHT: - There is no evidence of deep vein thrombosis in the lower extremity. However, portions of this examination were limited- see technologist comments above.  - No cystic structure found in the popliteal fossa.  LEFT: - There is no evidence of deep  vein thrombosis in the lower extremity. However, portions of this examination were limited- see technologist comments above.  - No cystic structure found in the popliteal fossa.  *See table(s) above for measurements and observations.  Electronically signed by Ruta Hinds MD on 05/21/2021 at 5:30:46 PM.    Final    ECHOCARDIOGRAM LIMITED  Result Date: 05/21/2021    ECHOCARDIOGRAM LIMITED REPORT   Patient Name:   Dana Coleman Date of Exam: 05/21/2021 Medical Rec #:  [id number]          Height:       61.0 in Accession #:    [id number]         Weight:       145.5 lb Date of Birth:  Jan 13, 1941           BSA:          1.650 m Patient Age:    80 years           BP:           124/77 mmHg Patient Gender: F                  HR:           100 bpm. Exam Location:  Inpatient Procedure: Limited Echo, Cardiac Doppler, Limited Color Doppler and Intracardiac            Opacification Agent                   STAT ECHO Reported to: Dr Marlou Porch on 05/20/2021 8:20:00 AM                  Stat order. Indications:    Dyspnea  History:        Patient has prior history of Echocardiogram examinations, most                 recent 05/06/2021. Previous Myocardial Infarction; Risk                 Factors:Diabetes, Dyslipidemia and Hypertension.  Sonographer:    Georganna Skeans RDCS Referring Phys: [id number] Tawanna Solo T ENIOLA IMPRESSIONS  1. Left ventricular ejection fraction, by estimation, is 25 to 30%. The left ventricle has severely decreased function. The left ventricle demonstrates regional wall motion abnormalities (see scoring diagram/findings for description). There is mild left  ventricular hypertrophy.  2. Right ventricular systolic function is normal. The right ventricular size is normal. There is mildly elevated pulmonary artery systolic pressure. The estimated right ventricular systolic pressure is [id number] mmHg.  3. The mitral valve is normal in structure. Mild mitral valve regurgitation. No evidence of mitral stenosis.  4. Tricuspid valve regurgitation is moderate.  5. The aortic valve is tricuspid. Aortic valve regurgitation is not visualized. Mild aortic valve sclerosis is present, with no evidence of aortic valve stenosis.  6. The inferior vena cava is normal in size with  greater than 50% respiratory variability, suggesting right atrial pressure of 3 mmHg. Comparison(s): Prior images unable to be directly viewed, comparison made by report only. The left ventricular function is worsened. FINDINGS  Left Ventricle: Left ventricular ejection fraction, by estimation, is 25 to 30%. The left ventricle has severely decreased function. The left ventricle demonstrates regional wall motion abnormalities. Definity contrast agent was given IV to delineate the left ventricular endocardial borders. The left ventricular internal cavity size was normal in size. There is mild left ventricular hypertrophy.  LV Wall Scoring: The mid and distal anterior septum,  entire apex, mid and distal inferior wall, and mid inferoseptal segment are akinetic. Right Ventricle: The right ventricular size is normal. No increase in right ventricular wall thickness. Right ventricular systolic function is normal. There is mildly elevated pulmonary artery systolic pressure. The tricuspid regurgitant velocity is 2.94  m/s, and with an assumed right atrial pressure of 8 mmHg, the estimated right ventricular systolic pressure is [id number] mmHg. Left Atrium: Left atrial size was normal in size. Right Atrium: Right atrial size was normal in size. Pericardium: There is no evidence of pericardial effusion. Mitral Valve: The mitral valve is normal in structure. Mild mitral valve regurgitation. No evidence of mitral valve stenosis. Tricuspid Valve: The tricuspid valve is normal in structure. Tricuspid valve regurgitation is moderate . No evidence of tricuspid stenosis. Aortic Valve: The aortic valve is tricuspid. Aortic valve regurgitation is not visualized. Aortic regurgitation PHT measures 774 msec. Mild aortic valve sclerosis is present, with no evidence of aortic valve stenosis. Aortic valve mean gradient measures 5.0 mmHg. Aortic valve peak gradient measures 11.0 mmHg. Pulmonic Valve: The pulmonic valve was normal in structure.  Pulmonic valve regurgitation is trivial. No evidence of pulmonic stenosis. Aorta: The aortic root is normal in size and structure. Venous: The inferior vena cava is normal in size with greater than 50% respiratory variability, suggesting right atrial pressure of 3 mmHg. IAS/Shunts: No atrial level shunt detected by color flow Doppler. LEFT VENTRICLE PLAX 2D LVIDd:         4.70 cm LVIDs:         3.70 cm LV PW:         0.70 cm LV IVS:        1.30 cm LVOT diam:     2.10 cm LVOT Area:     3.46 cm  LV Volumes (MOD) LV vol d, MOD A2C: 81.4 ml LV vol d, MOD A4C: 65.0 ml LV vol s, MOD A2C: 51.0 ml LV vol s, MOD A4C: 46.9 ml LV SV MOD A2C:     30.4 ml LV SV MOD A4C:     65.0 ml LV SV MOD BP:      26.5 ml RIGHT VENTRICLE RV Basal diam:  3.50 cm RV Mid diam:    2.10 cm RV S prime:     7.18 cm/s TAPSE (M-mode): 0.7 cm LEFT ATRIUM             Index       RIGHT ATRIUM           Index LA Vol (A2C):   53.5 ml 32.43 ml/m RA Area:     12.70 cm LA Vol (A4C):   30.9 ml 18.73 ml/m RA Volume:   28.70 ml  17.39 ml/m LA Biplane Vol: 41.8 ml 25.33 ml/m  AORTIC VALVE               PULMONIC VALVE AV Vmax:      166.00 cm/s  PV Vmax:       1.02 m/s AV Vmean:     101.000 cm/s PV Vmean:      68.100 cm/s AV VTI:       0.223 m      PV VTI:        0.174 m AV Peak Grad: 11.0 mmHg    PV Peak grad:  4.2 mmHg AV Mean Grad: 5.0 mmHg     PV Mean grad:  2.0 mmHg AI PHT:       774 msec  AORTA Ao Root  diam: 3.80 cm Ao Asc diam:  3.80 cm MR Peak grad: 70.9 mmHg   TRICUSPID VALVE MR Vmax:      421.00 cm/s TR Peak grad:   34.6 mmHg                           TR Vmax:        294.00 cm/s                            SHUNTS                           Systemic Diam: 2.10 cm Candee Furbish MD Electronically signed by Candee Furbish MD Signature Date/Time: 05/21/2021/11:06:49 AM    Final     Cardiac Studies:  Assessment/Plan:  Status post recent laparoscopic cholecystectomy, complicated by right empyema and esophageal perforation requiring emergency thoracotomy and  drainage of empyema and decortication. Possible bilateral pneumonia Status post recent perioperative non-STEMI. CAD, history of inferior wall MI and remote past. Ischemic cardiomyopathy. Status post recurrent A. Fib flutter with controlled ventricular response. Status post recent bilateral pulmonary embolism with RV strain which has resolved.  Hypertension. Diabetes mellitus. History of carcinoma of the breast status post mastectomy in the past. Plan Continue present management as per primary team. Start entresto , carvedilol Aldactone and SGLT2 inhibitor once able to tolerate by mouth I will sign off.  Please call if needed Follow-up with me in one week following discharge  LOS: 18 days    Charolette Forward 05/22/2021, 11:19 AM

## 2021-05-22 NOTE — Progress Notes (Signed)
Pt sister and brother at bedside, were continuing to question why esophageal perforation occurred and if we could reach out to the Dr who performed the lap cole. I told the pt and the family I don't know why the perforation occurred and that I am unable to reach out to the doctor who performed the lap cole.   Chrisandra Carota, RN 05/22/2021 4:57 PM

## 2021-05-22 NOTE — Progress Notes (Signed)
FPTS Interim Night Progress Note  S:Patient alert and confused after receiving second dose of Ativan 0.25 mg IV.  Had tolerated initial dose but no relief. Rounded with primary night RN.    O: Today's Vitals   05/22/21 0100 05/22/21 0323 05/22/21 0325 05/22/21 0350  BP:  119/77 119/77   Pulse: 89 98 98   Resp: (!) 22 (!) 28 (!) 27   Temp:  98.5 F (36.9 C) 98.5 F (36.9 C)   TempSrc:  Oral    SpO2: 96% 94%    Weight:    70.3 kg  Height:      PainSc:          A/P: Hold narcotics  Will hold repeat Ativan dosing If continues to have decrease in mentation and requiring increase in oxygen will obtain ABG and chest xray Continue to monitor   Carollee Leitz MD PGY-3, [facility] pager [phone]

## 2021-05-22 NOTE — Progress Notes (Addendum)
HPI Patient complaining of SOB. She said, "I can't breath," and wanted to sit by the bedside to get air in her lungs despite HFNC O2 in place at 30L/Min.  Exam: Gen: She is frail and appropriately anxious with moderate respiratory distress.  Heart: S1 S2 normal, no murmur. RRR. Lungs: Air entry reduced b/l with mid lung lobe to bibasal crackles Abd: Soft Ext: ++ upper extremity pitting edema. ?? Trace to no LL edema.   A/P: Acute hypoxic respiratory failure: (Acute PE, Acute on Chronic systolic CHF exacerbation, PAF) No major improvement in her respiratory status. She seems to have worsened this morning. Continue Heparin drip for PE. Likely some element of fluid overload. CT chest two days ago suggestive of ?? interstitial edema, which was not commented on the chest X-ray this AM. BNP is elevated but is at her baseline.  ECHO EF declined to 25-30% Weight increased from 145 lbs to 154 lbs today. Her baseline is 127.9 lbs. BP 130/80 - we will give one dose of 40 mg Lasix IV Monitor I&Os closely and continue daily weighing. Monitor O2 requirements closely. Consult CCM if she continues to decline.

## 2021-05-23 ENCOUNTER — Encounter (HOSPITAL_COMMUNITY): Payer: Self-pay | Admitting: Internal Medicine

## 2021-05-23 DIAGNOSIS — I255 Ischemic cardiomyopathy: Secondary | ICD-10-CM

## 2021-05-23 DIAGNOSIS — I2699 Other pulmonary embolism without acute cor pulmonale: Secondary | ICD-10-CM | POA: Diagnosis not present

## 2021-05-23 DIAGNOSIS — R0603 Acute respiratory distress: Secondary | ICD-10-CM

## 2021-05-23 DIAGNOSIS — I214 Non-ST elevation (NSTEMI) myocardial infarction: Secondary | ICD-10-CM | POA: Diagnosis not present

## 2021-05-23 DIAGNOSIS — E43 Unspecified severe protein-calorie malnutrition: Secondary | ICD-10-CM | POA: Diagnosis not present

## 2021-05-23 DIAGNOSIS — I5020 Unspecified systolic (congestive) heart failure: Secondary | ICD-10-CM | POA: Diagnosis present

## 2021-05-23 DIAGNOSIS — J869 Pyothorax without fistula: Secondary | ICD-10-CM | POA: Diagnosis present

## 2021-05-23 DIAGNOSIS — J810 Acute pulmonary edema: Secondary | ICD-10-CM | POA: Diagnosis not present

## 2021-05-23 DIAGNOSIS — J948 Other specified pleural conditions: Secondary | ICD-10-CM

## 2021-05-23 DIAGNOSIS — K223 Perforation of esophagus: Secondary | ICD-10-CM | POA: Diagnosis not present

## 2021-05-23 DIAGNOSIS — J9601 Acute respiratory failure with hypoxia: Secondary | ICD-10-CM | POA: Diagnosis not present

## 2021-05-23 DIAGNOSIS — R531 Weakness: Secondary | ICD-10-CM | POA: Diagnosis not present

## 2021-05-23 LAB — COMPREHENSIVE METABOLIC PANEL
ALT: 30 U/L (ref 0–44)
AST: 28 U/L (ref 15–41)
Albumin: 1.9 g/dL — ABNORMAL LOW (ref 3.5–5.0)
Alkaline Phosphatase: 65 U/L (ref 38–126)
Anion gap: 8 (ref 5–15)
BUN: 34 mg/dL — ABNORMAL HIGH (ref 8–23)
CO2: 23 mmol/L (ref 22–32)
Calcium: 8.3 mg/dL — ABNORMAL LOW (ref 8.9–10.3)
Chloride: 103 mmol/L (ref 98–111)
Creatinine, Ser: 0.75 mg/dL (ref 0.44–1.00)
GFR, Estimated: >60 mL/min (ref >60)
Glucose, Bld: 144 mg/dL — ABNORMAL HIGH (ref 70–99)
Potassium: 3.5 mmol/L (ref 3.5–5.1)
Sodium: 134 mmol/L — ABNORMAL LOW (ref 135–145)
Total Bilirubin: 0.4 mg/dL (ref 0.3–1.2)
Total Protein: 5.8 g/dL — ABNORMAL LOW (ref 6.5–8.1)

## 2021-05-23 LAB — GLUCOSE, CAPILLARY
Glucose-Capillary: 165 mg/dL — ABNORMAL HIGH (ref 70–99)
Glucose-Capillary: 71 mg/dL (ref 70–99)
Glucose-Capillary: 121 mg/dL — ABNORMAL HIGH (ref 70–99)
Glucose-Capillary: 135 mg/dL — ABNORMAL HIGH (ref 70–99)

## 2021-05-23 LAB — CBC
HCT: 29.3 % — ABNORMAL LOW (ref 36.0–46.0)
Hemoglobin: 9.6 g/dL — ABNORMAL LOW (ref 12.0–15.0)
MCH: 30 pg (ref 26.0–34.0)
MCHC: 32.8 g/dL (ref 30.0–36.0)
MCV: 91.6 fL (ref 80.0–100.0)
Platelets: 357 10*3/uL (ref 150–400)
RBC: 3.2 MIL/uL — ABNORMAL LOW (ref 3.87–5.11)
RDW: 17.9 % — ABNORMAL HIGH (ref 11.5–15.5)
WBC: 11 10*3/uL — ABNORMAL HIGH (ref 4.0–10.5)
nRBC: 1.3 % — ABNORMAL HIGH (ref 0.0–0.2)

## 2021-05-23 LAB — PHOSPHORUS: Phosphorus: 3.8 mg/dL (ref 2.5–4.6)

## 2021-05-23 LAB — HEPARIN LEVEL (UNFRACTIONATED): Heparin Unfractionated: 0.3 IU/mL (ref 0.30–0.70)

## 2021-05-23 LAB — MAGNESIUM: Magnesium: 2.1 mg/dL (ref 1.7–2.4)

## 2021-05-23 MED ORDER — POTASSIUM CHLORIDE 10 MEQ/50ML IV SOLN
10.0000 meq | INTRAVENOUS | Status: AC
  Administered 2021-05-23 (×3): 10 meq via INTRAVENOUS
  Filled 2021-05-23 (×4): qty 50

## 2021-05-23 MED ORDER — TRACE MINERALS CU-MN-SE-ZN 300-55-60-3000 MCG/ML IV SOLN
INTRAVENOUS | Status: AC
  Filled 2021-05-23: qty 745.53

## 2021-05-23 MED ORDER — POTASSIUM CHLORIDE 10 MEQ/50ML IV SOLN
10.0000 meq | INTRAVENOUS | Status: AC
  Administered 2021-05-23: 10 meq via INTRAVENOUS
  Filled 2021-05-23: qty 50

## 2021-05-23 NOTE — Progress Notes (Signed)
  Speech Language Pathology Treatment: Dysphagia  Patient Details Name: Dana Coleman MRN: [id number] DOB: 03-02-1941 Today's Date: 05/23/2021 Time: [id number] SLP Time Calculation (min) (ACUTE ONLY): 17 min  Assessment / Plan / Recommendation Clinical Impression  Pt slept well last night and reports feeling better today, subjectively also appearing to have somewhat reduced WOB. She is still requiring 40% FiO2 but at 25L now. Her SpO2 also improved when repositioned more upright for PO trials, although she does not feel comfortable in this position and does not like to stay up for long. RN noted coughing this morning while completing oral care. Pulmonologist also present for part of session and updated on SLP recommendations, including proceeding with MBS prior to resuming PO diet. He is in agreement considering her esophageal issues this admission (also why MBS would be preferred over FEES) and potential for aspiration prior to respiratory decline. Note that her O2 requirements will need to be decreased further before completing MBS, but he is okay with waiting given that she is receiving TPN. SLP provided ice chips today without overt s/s of aspiration, although needing a little bit of cues and encouragement for maintaining her position and using aspiration precautions. Will keep NPO except for meds crushed in puree and ice chips after oral care. SLP will f/u for MBS as medically able.    HPI HPI: Pt is a 80 yo female admitted on 05/04/21 for AMS. HX of cholecystectomy on 7/21.  Intubated upon admission, 7/24 Started CRRT, Esophageal perforation with septic shock post flap repair, drainage of empyema and decortication of R lung with chest tube placed.  7/25 EKG changes and probable nstemi. Extubated 7/26. Esophagram 8/1 cleared to start PO diet. CT removed 8/4. Pt with concern for aspiration 8/8 and made NPO. Pt also with new PE with R heart strain and increased O2 HFNC requirements 8/8. PMH includes:  R breast CA, DM, CAD, HTN.      SLP Plan  Continue with current plan of care       Recommendations  Diet recommendations: NPO;Other(comment) (ice chips after oral care) Medication Administration: Crushed with puree (provide ice chip first, then meds crushed in room temperature applesauce)                Oral Care Recommendations: Oral care QID Follow up Recommendations: Inpatient Rehab SLP Visit Diagnosis: Dysphagia, unspecified (R13.10) Plan: Continue with current plan of care       GO                Dana Coleman., M.A. [facility] Pager [phone] Office [phone]  05/23/2021, 10:16 AM

## 2021-05-23 NOTE — Progress Notes (Signed)
ANTICOAGULATION CONSULT NOTE  Pharmacy Consult for heparin Indication: atrial fibrillation and pulmonary embolus  No Known Allergies  Patient Measurements: Height: '5\' 1"'$  (154.9 cm) Weight: 70 kg (154 lb 5.2 oz) IBW/kg (Calculated) : 47.8 Heparin Dosing Weight: 63 kg  Vital Signs: Temp: 98.4 F (36.9 C) (08/11 0354) Temp Source: Oral (08/11 0354) BP: 131/76 (08/11 0354) Pulse Rate: 95 (08/11 0354)  Labs: Recent Labs    05/21/21 0352 05/21/21 1203 05/22/21 0224 05/22/21 0325 05/22/21 0554 05/23/21 0434  HGB 9.5*  --  9.2*  --  9.9* 9.6*  HCT 28.4*  --  28.1*  --  30.5* 29.3*  PLT 357  --  327  --  382 357  HEPARINUNFRC 0.26*   < >  --  RESULTS UNAVAILABLE DUE TO INTERFERING SUBSTANCE 0.33 0.30  CREATININE 0.79  --   --   --  0.76 0.75   < > = values in this interval not displayed.     Estimated Creatinine Clearance: 51 mL/min (by C-G formula based on SCr of 0.75 mg/dL).   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 30 tablet 0 05/04/2021   amLODipine (NORVASC) 10 MG tablet TAKE 1 TABLET BY MOUTH EVERY DAY (Patient taking differently: Take 10 mg by mouth daily.) 90 tablet 3 05/04/2021   aspirin EC 81 MG tablet Take 81 mg by mouth in the morning. Swallow whole.   05/04/2021   Cholecalciferol (VITAMIN D3) 50 MCG (2000 UT) TABS Take 2,000  Units by mouth in the morning.   05/04/2021   hydrochlorothiazide (HYDRODIURIL) 12.5 MG tablet TAKE 1 TABLET BY MOUTH EVERY DAY (Patient taking differently: Take 12.5 mg by mouth daily.) 90 tablet 3 05/04/2021   JARDIANCE 10 MG TABS tablet TAKE 1 TABLET BY MOUTH EVERY DAY (Patient taking differently: Take 10 mg by mouth daily.) 90 tablet 3 05/04/2021   LANTUS SOLOSTAR 100 UNIT/ML Solostar Pen INJECT 24 UNITS INTO THE SKIN EVERY MORNING. (Patient taking differently: Inject 24 Units into the skin daily.) 3 mL 9 05/04/2021   losartan (COZAAR) 50 MG tablet Take 50 mg by mouth in the morning.   05/04/2021   metFORMIN (GLUCOPHAGE-XR) 500 MG 24 hr tablet TAKE 1 TABLET BY MOUTH TWICE A DAY WITH BREAKFAST AND DINNER (Patient taking differently: Take 500 mg by mouth 2 (two) times daily.) 180 tablet 0 05/04/2021   nitroGLYCERIN (NITROSTAT) 0.4 MG SL tablet Place 1 tablet (0.4 mg total) under the tongue every 5 (five) minutes as needed for chest pain. (Patient taking differently: Place 0.4 mg under the tongue every 5 (five) minutes x 3 doses as needed for chest pain.) 25 tablet 1 unknown   omeprazole (PRILOSEC) 40 MG capsule Take 40 mg by mouth  every morning.   05/04/2021   rosuvastatin (CRESTOR) 20 MG tablet TAKE 1 TABLET BY MOUTH EVERYDAY AT BEDTIME (Patient taking differently: Take 20 mg by mouth at bedtime.) 90 tablet 3 05/03/2021   [EXPIRED] traMADol (ULTRAM) 50 MG tablet Take 1 tablet (50 mg total) by mouth every 6 (six) hours as needed for up to 5 days. 15 tablet 0 05/04/2021   Blood Glucose Monitoring Suppl MISC One touch ultra glucose monitor. Check blood sugar twice a day.      glucose blood (ONE TOUCH ULTRA TEST) test strip CHECK BLOOD SUGAR TWICE DAILY 100 each 1    Insulin Pen Needle (B-D UF III MINI PEN NEEDLES) 31G X 5 MM MISC INJECT INSULIN VIA PEN 6 TIMES DAILY 200 each 3    OneTouch Delica Lancets [id number] MISC 1 Container by Does not apply route as needed. 100 each PRN     Assessment: 80 yo female  continuing on heparin for acute PE/afib. PMH: cholcystectomy (05/02/21). She was not on anticoagulation PTA.  She remains on TPN. No evidence of bleeding.  -heparin level = 0.3 and on the low end of goal - Hgb = 9.6  Goal of Therapy:  Heparin level 0.3-0.7 units/ml Monitor platelets by anticoagulation protocol: Yes   Plan:  -Increase heparin to 1700 units/hr -Daily heparin level and CBC  Jethro Poling PharmD Student Pharmacist **Pharmacist phone directory can now be found on [email or web address] ([email or web address]).  Listed under [place].

## 2021-05-23 NOTE — Progress Notes (Signed)
PHARMACY - TOTAL PARENTERAL NUTRITION CONSULT NOTE  Indication: s/p esophageal perforation and repair  Patient Measurements: Height: '5\' 1"'$  (154.9 cm) Weight: 70 kg (154 lb 5.2 oz) IBW/kg (Calculated) : 47.8   Body mass index is 29.16 kg/m.  Assessment:  80 y/o female with recent lap chole for symptomatic cholelithiasis on 7/21. PMH including HTN, HLD, DM, R breast cancer, and CAD. Patient presented on 7/23 with AMS and unresponsiveness - was complaining of abdominal discomfort and then patient became unresponsive and slumped on table.  S/p gall bladder surgery and esophageal perforation-repaired 7/24.  Pharmacy consulted for TPN management.  Glucose / Insulin: DM with A1c 7.4% on Lantus 24/d PTA. CBGs 105 -204 Utilized 22 units SSI + 85 units insulin in TPN  Electrolytes: Na 134, K 3.5   Renal: septic ATN s/p CRRT (end 7/25) - SCr < 1 and stable, BUN up to 34 (getting Lasix) Hepatic: LFTs significantly increased 8/4 (decreased with holding TPN for 6 hours and Lasix), tbili WNL, albumin down 2, prealbumin up 8.9, TG WNL Intake / Output; MIVF:  UOP 143m with Lasix, net +7L (up), chest tube removed, LBM 8/8 GI Imaging: - 7/23 CT: no acute abnormalities  GI Surgeries / Procedures:  - 7/21: s/p laparoscopic cholecystectomy and LOA - 7/24: s/p R thoracotomy, repair of esophageal perf with muscle flap, drainage of empyema and decortication of R lung  Central access: PICC 7/26 (double lumen) TPN start date: 05/07/21  Nutritional Goals (per RD rec on 8/9): kCal: 1750-2030 , Protein: 90-120 , Fluid: >=1.8L  Current Nutrition:  TPN Ensure Enlive BID - none charted given Soft diet on 8/6 - very low intake, declining help with feeding >> NPO since 8/8 No feeding tube recommended due to esophageal injury Pending MBS  Plan:  Continue concentrated cyclic TPN - infuse [id number] over 16 hrs: 53 mL/hr x 1 hr, then 105 mL/hr x 14 hrs, then 53 mL/hr x 1 hr. TPN provides 111g AA, 230g CHO, and 58g  ILE for total of 1803 kCal, meeting 100% of patient's needs. Electrolytes in TPN: Na 30 mEq/L (= 47 mEq), K 30 mEq/L (= 47 mEq), Ca 021m/L since 7/30, Mg 11 mEq/L (= 17 mEq), Phos 30 mmol/L (= 47 mmol), maximum acetate Add standard MVI and trace elements to TPN Continue CVTS SSI Q4H and adjust insulin in TPN to 95 units Adjust lytes outside of TPN while diuresing - 4 runs of K today Monitor TPN labs on Mon/Thurs >> repeat in AM  MiBarth KirksPharmD, BCWhitesboroharmacist [phone] check AMION for all [facility]  05/23/2021 8:02 AM

## 2021-05-23 NOTE — Progress Notes (Signed)
Pt was able to tolerate ice cream very well with no coughing or swallowing issues, around 1500 today. SPO2 has remained stable on HFNC, I reached out to respiratory for them to come to assess her needs to see if we can wean her off, no response from respiratory.  Chrisandra Carota, RN 05/23/2021 6:12 PM

## 2021-05-23 NOTE — Progress Notes (Signed)
Patient ID: Dana Coleman, female   DOB: [birth year], 80 y.o.   MRN: [id number]    Progress Note from the Palliative Medicine Team at [facility]   Patient Name: Dana Coleman        Date: 05/23/2021 DOB: [birth year]  Age: 80 y.o. MRN#: [id number] Attending Physician: McDiarmid, Blane Ohara, MD Primary Care Physician: Gifford Shave, MD Admit Date: 05/04/2021   Medical records reviewed   80 y.o. female  admitted on 05/04/2021 with hx of HTN, IDDM, CAD who was recently admitted and underwent cholecystectomy on 7/21 presented to ED today 7/23 w/  AMS and SOB.   In ED pt has low SpO2 82% on NRB at 15L, bradycardia in 40's and hypotension 88/60. CXR showed large loculated R pleural effusion. PCCM was consulted and intubated the patient.  Gen surgery placed a R chest tube for suspected empyema. Labs showed metabolic acidosis severe and creat 4 (normal creat 0.8 on 7/15).   Cardiothoracic consulted on 7-24 for esophageal perforation.   Hospital course has been complicated by non- STEMI and acute PE.  EF is 25%.  Increasing oxygen needs , currently on continuous TPN.   Today is day 19 of this hospitalization.    This NP visited patient at the bedside as a follow up to  yesterday's [place] and to meet with her 2 children son/Dana Coleman and daughter/Dana Coleman to offer education regarding current medical situation.  Education offered on the seriousness of the patient's current medical situation, however all remain hopeful for continued full medical support and hope for improvement.  We discussed best case scenario versus worst-case scenario and the importance of preparedness plans and  discussed with patient the importance of continued conversation with her  family and the medical providers regarding overall plan of care and treatment options,  ensuring decisions are within the context of the patients values and GOCs.  Patient was able to verbalize to her 2 children at the bedside that in a situation  where she could not speak for herself she would want her sister Dana Coleman to be her spokesperson.  Both of her children agree.  We will asked chaplaincy to help expedite H POA.  Of note patient and her family have had multiple deaths over the last year.   Dana Coleman has lost 79 daughters and a sister during that time.  Questions and concerns addressed   Discussed with Dr Redmond Pulling, Dr Elsworth Soho  Total time spent on the unit was 50  minutes  This nurse practitioner informed  the patient/family and the attending that I will be out of the hospital for the next 10 day Call palliative medicine team phone # [phone] with questions or concerns at any time.  Palliative medicine provider will continue to follow [name].  Greater than 50% of the time was spent in counseling and coordination of care  Wadie Lessen NP  Palliative Medicine Team Team Phone # [phone] Pager [phone]

## 2021-05-23 NOTE — Progress Notes (Signed)
[address]       [employer] [address]             [phone]      18 Days Post-Op Procedure(s) (LRB): THORACOTOMY MAJOR REPAIR PERFORATED ESOPHAGUS (Right) Subjective: Depressed affect but says she is slowly feeling better  Objective: Vital signs in last 24 hours: Temp:  [97.8 F (36.6 C)-98.9 F (37.2 C)] 98.4 F (36.9 C) (08/11 0354) Pulse Rate:  [81-96] 95 (08/11 0354) Cardiac Rhythm: Normal sinus rhythm;Bundle branch block (08/10 1956) Resp:  [20-27] 20 (08/11 0354) BP: (118-131)/(73-84) 131/76 (08/11 0354) SpO2:  [95 %-100 %] 95 % (08/11 0354) FiO2 (%):  [40 %] 40 % (08/11 0315) Weight:  [70 kg] 70 kg (08/11 0354)  Hemodynamic parameters for last 24 hours:    Intake/Output from previous day: 08/10 0701 - 08/11 0700 In: 2294.5 [I.V.:1764.5; IV Piggyback:530] Out: 1500 [Urine:1500] Intake/Output this shift: No intake/output data recorded.  General appearance: cooperative, fatigued, and no distress Heart: regular rate and rhythm Lungs: dim in right>left base Abdomen: soft, non tender or distended Extremities: no edema Wound: incis healing well  Lab Results: Recent Labs    05/22/21 0554 05/23/21 0434  WBC 11.6* 11.0*  HGB 9.9* 9.6*  HCT 30.5* 29.3*  PLT 382 357   BMET:  Recent Labs    05/22/21 0554 05/23/21 0434  NA 134* 134*  K 3.8 3.5  CL 103 103  CO2 20* 23  GLUCOSE 175* 144*  BUN 34* 34*  CREATININE 0.76 0.75  CALCIUM 8.4* 8.3*    PT/INR: No results for input(s): LABPROT, INR in the last 72 hours. ABG    Component Value Date/Time   PHART 7.420 05/22/2021 0735   HCO3 20.2 05/22/2021 0735   TCO2 29 05/07/2021 0516   ACIDBASEDEF 3.5 (H) 05/22/2021 0735   O2SAT 91.1 05/22/2021 0735   CBG (last 3)  Recent Labs    05/22/21 1942 05/22/21 2310 05/23/21 0356  GLUCAP 204* 148* 165*    Meds Scheduled Meds:  sodium chloride   Intravenous Once   acetaminophen  650 mg Rectal Q4H   chlorhexidine  15 mL Mouth Rinse  BID   Chlorhexidine Gluconate Cloth  6 each Topical Daily   feeding supplement  237 mL Oral BID BM   Gerhardt's butt cream  1 application Topical QID   insulin aspart  0-24 Units Subcutaneous Q4H   lidocaine  1 patch Transdermal Q24H   mouth rinse  15 mL Mouth Rinse q12n4p   metoprolol tartrate  5 mg Intravenous Q6H   pantoprazole (PROTONIX) IV  40 mg Intravenous QHS   ramelteon  8 mg Oral QHS   sodium chloride flush  10-40 mL Intracatheter Q12H   Continuous Infusions:  sodium chloride 10 mL/hr at 05/23/21 0500   anidulafungin Stopped (05/22/21 1322)   cefTRIAXone (ROCEPHIN)  IV Stopped (05/22/21 1407)   heparin 1,600 Units/hr (05/23/21 0500)   metronidazole 500 mg ([id number])   TPN CYCLIC-ADULT (ION) 123456 mL/hr at 05/23/21 0500   PRN Meds:.sodium chloride, fentaNYL (SUBLIMAZE) injection, ondansetron (ZOFRAN) IV, sodium chloride, sodium chloride flush  Xrays DG Chest Portable 1 View  Result Date: 05/22/2021 CLINICAL DATA:  Dyspnea. History of diabetes, hypertension, and myocardial infarction. EXAM: PORTABLE CHEST 1 VIEW COMPARISON:  05/21/2021 FINDINGS: The patient is rotated to the right on today's radiograph, reducing diagnostic sensitivity and specificity. Left central line tip: SVC. Skin staples project over the right lateral hemithorax, history of thoracotomy on  05/05/2021 for esophageal repair. Continued pleural thickening on the right Atherosclerotic calcification of the aortic arch. Stable mild to moderate cardiomegaly. Stable small left pleural effusion. Stable asymmetric interstitial accentuation in the right lung. Stable airspace opacity in the right lower lobe. IMPRESSION: 1. Stable appearance of the chest, including bibasilar airspace opacities, right pleural thickening, asymmetric right interstitial accentuation, and postoperative findings related to prior right thoracotomy. 2.  Aortic Atherosclerosis (ICD10-I70.0). Electronically Signed   By: Van Clines M.D.   On:  05/22/2021 07:22   VAS [place] LOWER EXTREMITY VENOUS (DVT)  Result Date: 05/21/2021  Lower Venous DVT Study Patient Name:  SHARMELL MEIS  Date of Exam:   05/21/2021 Medical Rec #: [id number]           Accession #:    [id number] Date of Birth: 79-Dec-1942            Patient Gender: F Patient Age:   80 years Exam Location:  [facility] Procedure:      VAS [place] LOWER EXTREMITY VENOUS (DVT) Referring Phys: [facility] ALVA --------------------------------------------------------------------------------  Indications: Pulmonary embolism.  Risk Factors: None identified. Limitations: Poor ultrasound/tissue interface. Comparison Study: No prior studies. Performing Technologist: Oliver Hum RVT  Examination Guidelines: A complete evaluation includes B-mode imaging, spectral Doppler, color Doppler, and power Doppler as needed of all accessible portions of each vessel. Bilateral testing is considered an integral part of a complete examination. Limited examinations for reoccurring indications may be performed as noted. The reflux portion of the exam is performed with the patient in reverse Trendelenburg.  +---------+---------------+---------+-----------+----------+--------------+ RIGHT    CompressibilityPhasicitySpontaneityPropertiesThrombus Aging +---------+---------------+---------+-----------+----------+--------------+ CFV      Full           Yes      Yes                                 +---------+---------------+---------+-----------+----------+--------------+ SFJ      Full                                                        +---------+---------------+---------+-----------+----------+--------------+ FV Prox  Full                                                        +---------+---------------+---------+-----------+----------+--------------+ FV Mid   Full                                                        +---------+---------------+---------+-----------+----------+--------------+  FV DistalFull                                                        +---------+---------------+---------+-----------+----------+--------------+ PFV      Full                                                        +---------+---------------+---------+-----------+----------+--------------+  POP      Full           Yes      Yes                                 +---------+---------------+---------+-----------+----------+--------------+ PTV      Full                                                        +---------+---------------+---------+-----------+----------+--------------+ PERO     Full                                                        +---------+---------------+---------+-----------+----------+--------------+   +---------+---------------+---------+-----------+----------+--------------+ LEFT     CompressibilityPhasicitySpontaneityPropertiesThrombus Aging +---------+---------------+---------+-----------+----------+--------------+ CFV      Full           Yes      Yes                                 +---------+---------------+---------+-----------+----------+--------------+ SFJ      Full                                                        +---------+---------------+---------+-----------+----------+--------------+ FV Prox  Full                                                        +---------+---------------+---------+-----------+----------+--------------+ FV Mid                  Yes      Yes                                 +---------+---------------+---------+-----------+----------+--------------+ FV DistalFull                                                        +---------+---------------+---------+-----------+----------+--------------+ PFV      Full                                                        +---------+---------------+---------+-----------+----------+--------------+ POP      Full           Yes      Yes                                  +---------+---------------+---------+-----------+----------+--------------+  PTV      Full                                                        +---------+---------------+---------+-----------+----------+--------------+ PERO     Full                                                        +---------+---------------+---------+-----------+----------+--------------+     Summary: RIGHT: - There is no evidence of deep vein thrombosis in the lower extremity. However, portions of this examination were limited- see technologist comments above.  - No cystic structure found in the popliteal fossa.  LEFT: - There is no evidence of deep vein thrombosis in the lower extremity. However, portions of this examination were limited- see technologist comments above.  - No cystic structure found in the popliteal fossa.  *See table(s) above for measurements and observations. Electronically signed by Ruta Hinds MD on 05/21/2021 at 5:30:46 PM.    Final    ECHOCARDIOGRAM LIMITED  Result Date: 05/21/2021    ECHOCARDIOGRAM LIMITED REPORT   Patient Name:   SAQUOYA EMICK Date of Exam: 05/21/2021 Medical Rec #:  [id number]          Height:       61.0 in Accession #:    [id number]         Weight:       145.5 lb Date of Birth:  Jul 07, 1941           BSA:          1.650 m Patient Age:    68 years           BP:           124/77 mmHg Patient Gender: F                  HR:           100 bpm. Exam Location:  Inpatient Procedure: Limited Echo, Cardiac Doppler, Limited Color Doppler and Intracardiac            Opacification Agent                   STAT ECHO Reported to: Dr Marlou Porch on 05/20/2021 8:20:00 AM                  Stat order. Indications:    Dyspnea  History:        Patient has prior history of Echocardiogram examinations, most                 recent 05/06/2021. Previous Myocardial Infarction; Risk                 Factors:Diabetes, Dyslipidemia and Hypertension.  Sonographer:    Georganna Skeans RDCS  Referring Phys: [id number] Tawanna Solo T ENIOLA IMPRESSIONS  1. Left ventricular ejection fraction, by estimation, is 25 to 30%. The left ventricle has severely decreased function. The left ventricle demonstrates regional wall motion abnormalities (see scoring diagram/findings for description). There is mild left  ventricular hypertrophy.  2. Right ventricular systolic function is normal. The right ventricular size is normal. There is mildly elevated pulmonary  artery systolic pressure. The estimated right ventricular systolic pressure is [id number] mmHg.  3. The mitral valve is normal in structure. Mild mitral valve regurgitation. No evidence of mitral stenosis.  4. Tricuspid valve regurgitation is moderate.  5. The aortic valve is tricuspid. Aortic valve regurgitation is not visualized. Mild aortic valve sclerosis is present, with no evidence of aortic valve stenosis.  6. The inferior vena cava is normal in size with greater than 50% respiratory variability, suggesting right atrial pressure of 3 mmHg. Comparison(s): Prior images unable to be directly viewed, comparison made by report only. The left ventricular function is worsened. FINDINGS  Left Ventricle: Left ventricular ejection fraction, by estimation, is 25 to 30%. The left ventricle has severely decreased function. The left ventricle demonstrates regional wall motion abnormalities. Definity contrast agent was given IV to delineate the left ventricular endocardial borders. The left ventricular internal cavity size was normal in size. There is mild left ventricular hypertrophy.  LV Wall Scoring: The mid and distal anterior septum, entire apex, mid and distal inferior wall, and mid inferoseptal segment are akinetic. Right Ventricle: The right ventricular size is normal. No increase in right ventricular wall thickness. Right ventricular systolic function is normal. There is mildly elevated pulmonary artery systolic pressure. The tricuspid regurgitant velocity is 2.94  m/s, and  with an assumed right atrial pressure of 8 mmHg, the estimated right ventricular systolic pressure is [id number] mmHg. Left Atrium: Left atrial size was normal in size. Right Atrium: Right atrial size was normal in size. Pericardium: There is no evidence of pericardial effusion. Mitral Valve: The mitral valve is normal in structure. Mild mitral valve regurgitation. No evidence of mitral valve stenosis. Tricuspid Valve: The tricuspid valve is normal in structure. Tricuspid valve regurgitation is moderate . No evidence of tricuspid stenosis. Aortic Valve: The aortic valve is tricuspid. Aortic valve regurgitation is not visualized. Aortic regurgitation PHT measures 774 msec. Mild aortic valve sclerosis is present, with no evidence of aortic valve stenosis. Aortic valve mean gradient measures 5.0 mmHg. Aortic valve peak gradient measures 11.0 mmHg. Pulmonic Valve: The pulmonic valve was normal in structure. Pulmonic valve regurgitation is trivial. No evidence of pulmonic stenosis. Aorta: The aortic root is normal in size and structure. Venous: The inferior vena cava is normal in size with greater than 50% respiratory variability, suggesting right atrial pressure of 3 mmHg. IAS/Shunts: No atrial level shunt detected by color flow Doppler. LEFT VENTRICLE PLAX 2D LVIDd:         4.70 cm LVIDs:         3.70 cm LV PW:         0.70 cm LV IVS:        1.30 cm LVOT diam:     2.10 cm LVOT Area:     3.46 cm  LV Volumes (MOD) LV vol d, MOD A2C: 81.4 ml LV vol d, MOD A4C: 65.0 ml LV vol s, MOD A2C: 51.0 ml LV vol s, MOD A4C: 46.9 ml LV SV MOD A2C:     30.4 ml LV SV MOD A4C:     65.0 ml LV SV MOD BP:      26.5 ml RIGHT VENTRICLE RV Basal diam:  3.50 cm RV Mid diam:    2.10 cm RV S prime:     7.18 cm/s TAPSE (M-mode): 0.7 cm LEFT ATRIUM             Index       RIGHT ATRIUM  Index LA Vol (A2C):   53.5 ml 32.43 ml/m RA Area:     12.70 cm LA Vol (A4C):   30.9 ml 18.73 ml/m RA Volume:   28.70 ml  17.39 ml/m LA Biplane Vol: 41.8 ml  25.33 ml/m  AORTIC VALVE               PULMONIC VALVE AV Vmax:      166.00 cm/s  PV Vmax:       1.02 m/s AV Vmean:     101.000 cm/s PV Vmean:      68.100 cm/s AV VTI:       0.223 m      PV VTI:        0.174 m AV Peak Grad: 11.0 mmHg    PV Peak grad:  4.2 mmHg AV Mean Grad: 5.0 mmHg     PV Mean grad:  2.0 mmHg AI PHT:       774 msec  AORTA Ao Root diam: 3.80 cm Ao Asc diam:  3.80 cm MR Peak grad: 70.9 mmHg   TRICUSPID VALVE MR Vmax:      421.00 cm/s TR Peak grad:   34.6 mmHg                           TR Vmax:        294.00 cm/s                            SHUNTS                           Systemic Diam: 2.10 cm Candee Furbish MD Electronically signed by Candee Furbish MD Signature Date/Time: 05/21/2021/11:06:49 AM    Final     Assessment/Plan: S/P Procedure(s) (LRB): THORACOTOMY MAJOR REPAIR PERFORATED ESOPHAGUS (Right)  1 afeb, VSS 2 sats good on 25 l HFNC/.40- reduced from yesterday 3 conts to diurese well, normal renal fxn 4 H/H stable 5 min elev WBC- stable 6 cont TPN, NPO except meds with swallow dysfunction, adjust electrolytes as needed, hopefully will not need feeding tube 7 heparin gtt, sinus rhythm currently with bifascicular block, periop MI/PE  cardiology has left recs and signed off - will need long term ACRX plan determined 8 BS control pretty reasonable, rare>200 9 PCCM/FP- primary med management 10 rehab modalities/pulm toilet as able  LOS: 19 days    Gaspar Bidding Pager [phone] 05/23/2021

## 2021-05-23 NOTE — Therapy (Signed)
Occupational Therapy Treatment Patient Details Name: Dana Coleman MRN: [id number] DOB: 1941-05-19 Today's Date: 05/23/2021    History of present illness Pt is a 80 yo female admitted on 05/04/21 for AMS. HX of cholecystectomy on 7/21.  Intubated upon admission, 7/24 Started CRRT, Esophageal perforation with septic shock post flap repair, drainage of empyema and decortication of R lung with chest tube placed.  7/25 EKG changes and probable nstemi. Extubated 7/26. CT removed 8/4. Pt with new PE with R heart strain and increased O2 HFNC requirements 8/8. PMH includes: R breast CA, DM, CAD, HTN.   OT comments  Pt seen for acute OT with focus on bed level UE exercises within pt tolerance and limited grooming bed level. Pt deconditioning/lethargy and O2 needs secondary to medical status is limiting factor. RR increases with limited activity noted. Pt states that she feels "a little bit better today" as compared to her last session.       Follow Up Recommendations  CIR;Supervision/Assistance - 24 hour    Equipment Recommendations  3 in 1 bedside commode    Recommendations for Other Services      Precautions / Restrictions Precautions Precautions: Fall Precaution Comments: watch RR, HFNC/increased O2 requirements s/p PE Restrictions Weight Bearing Restrictions: No       Mobility Bed Mobility Overal bed mobility: Needs Assistance   Rolling: Mod assist         General bed mobility comments: Deferred OOB session due to pt increased WOB/O2 saturation needs              ADL either performed or assessed with clinical judgement   ADL Overall ADL's : Needs assistance/impaired     Grooming: Set up;Bed level     General ADL Comments: Pt is limited by overall weakness/deconditioning, decreased activity tolerance secondary to medical status.    Cognition Arousal/Alertness: Awake/alert Behavior During Therapy: WFL for tasks assessed/performed;Flat affect    Following  Commands: Follows one step commands consistently   Awareness: Emergent Problem Solving: Slow processing      Exercises Exercises: Other exercises;General Upper Extremity (Bed level) General Exercises - Upper Extremity Shoulder Flexion: AROM;AAROM;Both;15 reps;Supine Shoulder Extension: AAROM;Both;15 reps;Supine Shoulder Horizontal ABduction: AAROM;15 reps;Supine Shoulder Horizontal ADduction: AAROM;Both;15 reps;Supine Elbow Flexion: Both;AROM;10 reps;Supine Elbow Extension: AROM;Both;10 reps;Supine Wrist Flexion: AROM;10 reps;Both;Supine Wrist Extension: AROM;Both;10 reps;Supine Digit Composite Flexion: AROM;Both;10 reps;Supine      General Comments Pt states that she "feels a little bit better today"    Pertinent Vitals/ Pain       Pain Assessment: Faces Pain Score: 4  Faces Pain Scale: Hurts little more Pain Location: discomfort during UE ther ex and repositioning. 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." Time For Goal Achievement: 06/05/21 Potential to Achieve Goals: [place] Discharge plan remains appropriate;Frequency remains appropriate       AM-PAC OT "6 Clicks" Daily Activity     Outcome Measure   Help from another person eating meals?: A Little (NPO, Ice chips only) Help from another person taking care of personal grooming?: A Lot Help from another person toileting, which includes using toliet, bedpan, or urinal?: Total Help from another  person bathing (including washing, rinsing, drying)?: A Lot Help from another person to put on and taking off regular upper body clothing?: A  Little Help from another person to put on and taking off regular lower body clothing?: A Lot 6 Click Score: 13    End of Session Equipment Utilized During Treatment: Oxygen  OT Visit Diagnosis: Other abnormalities of gait and mobility (R26.89);Muscle weakness (generalized) (M62.81);Pain;Other symptoms and signs involving cognitive function Pain - Right/Left:  (Pt reports generalized deconditioning, difficulty with respirations per her report)   Activity Tolerance Patient limited by fatigue;Patient limited by lethargy   Patient Left in bed;with call bell/phone within reach   Nurse Communication  Pt performed UE therapeutic exercise at bed level.        Time: [id number] OT Time Calculation (min): 17 min  Charges: OT General Charges $OT Visit: 1 Visit OT Treatments $Therapeutic Exercise: 8-22 mins   Ulises Wolfinger, [place], OTR/L 05/23/2021, 12:05 PM

## 2021-05-23 NOTE — Progress Notes (Signed)
Family Medicine Teaching Service Daily Progress Note Intern Pager: [phone]  Patient name: Dana Coleman Medical record number: [id number] Date of birth: 09/29/1941 Age: 80 y.o. Gender: female  Primary Care Provider: Gifford Shave, MD Consultants: Palliative medicine, CCM, CVTS, cardiology (s/o), general surgery (s/o), nephrology (s/o) Code Status: Partial: no CPR, no intubation  Pt Overview and Major Events to Date:  7/23: right pigtail catheter placed, admitted to ICU, intubated 7/24: CRRT, emergent right thoracotomy, repair of esophageal perforation with intercostal pedicled muscle flap and drainage of empyema and decortication of right lung 7/25: probable, inferior NSTEMI, no acute intervention. Echo with mildly reduced LV, large RV 7/26: extubated, double-lumen PICC placed, TPN started 8/1: Esophagram without leak, started on PO, heparin gtt stopped to increased bleeding from chest tubes. Declotted by CVTS. Transfused 1u pRBC 8/3: anterior chest tube removed 8/4: remaining chest tube removed 8/8: increased O2 requirement, Afib RVR -> PE with right heart strain 8/9: further increased O2 requirement, Afib   Abx timeline Zosyn: 7/23-7/26 Eraxis: 7/23- Unasyn: 7/26-7/31 Meropenem: 7/31-8/3 Ceftriaxone: 8/3-8/11 Metronidazole: 8/3-8/11   Micro Timeline 7/23: MRSA PCR negative 7/23: pleural fluid grew strep salivarius pan-sensitive, strep mitas (sensitive CTX), rare candida albicans 7/24: blood cultures negative x2 7/30: blood cultures negative x2  Assessment and Plan: Dana Coleman is a 80 year old female who was admitted on 7/23 for AMS and shortness of breath, subsequently found to have acute renal failure, esophageal perforation now POD #18 from repair.  Admission was complicated by probable inferior NSTEMI, cute blood loss anemia requiring 1 unit packed red blood cells, A. fib with RVR with consequent PE with right heart strain.  Past medical history significant for  T1DM, CAD, HTN, HLD, MI (2004), Hurthle cell adenoma (2004), cataracts, cervical cancer.  Esophageal perforation s/p repair  empyema POD #18, patient is feeling less discomfort in the right side of her back.  WBC continues to remain stable at 11.0.  Today's last day of antibiotics with 1 more day remaining for antifungal.  Patient seems well controlled with pain as she has not been concerned at home for several days. -Continue Eraxis '100mg'$  daily, finished tomorrow -Last dose of Metronidazole '500mg'$  BID and Ceftriaxone 2g daily -Continue Tylenol '650mg'$  q4h PRN -Continue oxycodone 5-'10mg'$  q6h PRN  A. fib with RVR  PE with right heart strain  hypoxia On HFNC 25 L 40% FiO2.  Patient continuing to have shortness of breath.  She is still currently in sinus rhythm. -CVTS and CCM continue to follow, appreciate recommendations -Palliative care continue to be involved in care patient is now on partial code status   MBS with speech therapy once respiratory status improves  FEN/GI: N.p.o., TPN PPx: Heparin 1700 units/h IV Dispo:CIR pending clinical improvement . Barriers include clinical status poor.   Subjective:  Patient is still uncomfortable shortness of breath denies pain.  She states that the pain on the right side of her back has improved with movement and only hurts when strong pressure is applied.  She is still fatigued and she is unable to sleep much due to poor breathing.  Objective: Temp:  [97.8 F (36.6 C)-98.9 F (37.2 C)] 98.1 F (36.7 C) (08/11 0812) Pulse Rate:  [81-95] 92 (08/11 0812) Resp:  [20-27] 20 (08/11 0812) BP: (120-135)/(75-84) 135/81 (08/11 0812) SpO2:  [95 %-99 %] 97 % (08/11 0812) FiO2 (%):  [40 %] 40 % (08/11 0738) Weight:  [70 kg] 70 kg (08/11 0354) Physical Exam: General: Uncomfortable and tired appearing, concerned look on face Cardiovascular:  RRR, no murmurs auscultated Respiratory: Diminished lung sounds in the right base, no wheezing or  crackles Abdomen: Soft, nontender, nondistended, normoactive bowel sounds Extremities: No pitting edema noted on upper or lower extremities bilaterally  Laboratory: Recent Labs  Lab 05/22/21 0224 05/22/21 0554 05/23/21 0434  WBC 9.5 11.6* 11.0*  HGB 9.2* 9.9* 9.6*  HCT 28.1* 30.5* 29.3*  PLT 327 382 357   Recent Labs  Lab 05/21/21 0352 05/22/21 0554 05/23/21 0434  NA 134* 134* 134*  K 3.1* 3.8 3.5  CL 101 103 103  CO2 22 20* 23  BUN 31* 34* 34*  CREATININE 0.79 0.76 0.75  CALCIUM 8.3* 8.4* 8.3*  PROT 6.4* 6.2* 5.8*  BILITOT 0.4 0.6 0.4  ALKPHOS 72 70 65  ALT 43 37 30  AST 30 32 28  GLUCOSE 216* 175* 144*    Imaging/Diagnostic Tests: No new imaging/studies at this time.  Wells Guiles, DO 05/23/2021, 11:54 AM PGY-1, [place] Intern pager: [phone], text pages welcome

## 2021-05-23 NOTE — Progress Notes (Signed)
NAME:  Dana Coleman, MRN:  [id number], DOB:  01-05-1941, LOS: 29 ADMISSION DATE:  05/04/2021, CONSULTATION DATE:  7/23 REFERRING MD:  Ralene Bathe, CHIEF COMPLAINT:  Dyspnea   History of Present Illness:  80 y/o female admitted for empyema after a lap chole, noted to have esophageal perforation. Underwent repair, course complicated by non-STEMI and acute PE  Pertinent  Medical History  DM2 CAD Hypertension Hyperlipidemia Prior R breast cancer  Significant Hospital Events: Including procedures, antibiotic start and stop dates in addition to other pertinent events   7/23 admitted intubated. Pigtail by CCS in ED -- interestingly seems to have bilious outpt 7/24 Starting CRRT. Abundant GPCs in pleural fluid 7/24 1.  Right thoracotomy 2.  Repair of esophageal perforation with intercostal pedicled muscle flap. 3.  Drainage of empyema and decortication of right lung 7/25 early AM EKG changes, trop leak, probable inferiror NSTEMI, not candidate for intervention.  Echo with mildly reduced LV, large RV ?cause 7/26 extubated 7/29 up out of bed, rising WBC 7/30 blood culture >  7/31 WBC up again, cough with mucus production, chest pain 8/1 elevated WBC, added Vanco, MRSA swab pending, CT stripped from clot by [facility]  8/2 pain control, OOB, passed swallow, started clears  8/3 mobility  8/8 increased O2 needs, PE on imaging, reconsulted.  CTA chest shows filling defects in right middle lobe right lower lobe and left upper lobe, RV/LV ratio 1.2 8/9 on 25 L / 50% high flow nasal cannula ,diuresed 8/9 echo showed EF 25%, cardiology consulted 8/10 Received 2 doses of Ativan and was confused ,Episodes of choking with pills, made n.p.o.  Interim History / Subjective:   Remains tenuous, chronic critically ill. Afebrile Urine output 1.5 L with Lasix Remains on high flow nasal cannula 25 L / 40%  Objective   Blood pressure 135/81, pulse 92, temperature 98.1 F (36.7 C), temperature source Oral,  resp. rate 20, height '5\' 1"'$  (1.549 m), weight 70 kg, SpO2 97 %.    FiO2 (%):  [40 %] 40 %   Intake/Output Summary (Last 24 hours) at 05/23/2021 1037 Last data filed at 05/23/2021 0815 Gross per 24 hour  Intake 2294.54 ml  Output 1500 ml  Net 794.54 ml    Filed Weights   05/15/21 0400 05/22/21 0350 05/23/21 0354  Weight: 66 kg 70.3 kg 70 kg    Examination: Constitutional: frail elderly woman , no distress, on high flow nasal cannula Eyes: EOMI, pupils equal Ears, nose, mouth, and throat: MMM, trachea midline Cardiovascular: S1-S2 regular, sinus on monitor Respiratory: No accessory muscle use, decreased breath sounds bilateral, no rhonchi Gastrointestinal: soft, +BS Skin: No rashes, normal turgor, R chest incision site healing well Neurologic: moves all 4 ext, strongly weak appearing Psychiatric: Anxious affect   Chest x-ray 8/10 independently reviewed -rotated film, bibasilar airspace disease, right effusion versus pleural thickening  Labs show mild hyponatremia, stable renal function, stable mild leukocytosis and anemia  Resolved Hospital Problem list   Bleeding from chest tube, small hemothorax, clot and chest tube (improved)  AKI   Assessment & Plan:  Acute hypoxemic respiratory failure- secondary to volume overload, third spacing from poor nutrition w/ pleural effusions, interlobular septal thickening, new  acute PE.   Gen weakness and poor cough mechanics also contributing  --Continue IV heparin - Encourage IS -Tolerating HFNC, did not tolerate BiPAP -Remains tachypneic  Ischemic cardiomyopathy, EF 25%  NSTEMI earlier in stay - - Push diuresis , check BNP intermittently -Once able to tolerate p.o.,  will need to start Entresto, beta-blocker then and Aldactone and SGLT2 inhibitors   Reactive Afib/ RVR , currently in sinus rhythm -Lopressor 5 mg every 6  Post op primary esophageal repair for primary esophageal rupture with polymicrobial empyema  (05/05/21) -Continue ceftriaxone, Flagyl and anidulafungin -unclear duration -Await identification of yeast in pleural peel  Dysphagia - eso 8/1 did not show any leak Will need MBS once respiratory status improves, d/w speech therapist  Profound muscular deconditioning and malnutrition- on TPN due to inability to keep up with nutritional demands -PT recommends CIR eventually   Limited CODE STATUS, no CPR, no intubation , appreciate palliative care input Hopeful that hypoxia will improve gradually with diuresis and supportive care  Kara Mead MD. FCCP. [facility] Pager : [phone]  If no response to pager , please call [phone] until 7 pm After 7:00 pm call Elink  [phone]   05/23/2021

## 2021-05-23 NOTE — Progress Notes (Signed)
Attempted to reach pt's sister [name] via phone to answer questions she had about pt's gallbladder surgery. LVM.   Leighton Ruff. Redmond Pulling, MD, FACS General, Bariatric, & Minimally Invasive Surgery [facility], [place]

## 2021-05-23 NOTE — Progress Notes (Signed)
FPTS Interim Night Progress Note  S:Patient sleeping comfortably.  Rounded with primary night RN.  No concerns voiced.  No orders required.    O: Today's Vitals   05/22/21 2308 05/23/21 0315 05/23/21 0316 05/23/21 0354  BP: 130/83  131/76 131/76  Pulse: 92 84 94 95  Resp: 20 (!) '26 20 20  '$ Temp: 98.9 F (37.2 C)  98.4 F (36.9 C) 98.4 F (36.9 C)  TempSrc:    Oral  SpO2:  95%  95%  Weight:    70 kg  Height:      PainSc:          A/P: Continue to current management Monitor respiratory status  Low threshold to reach out to CCM for transfer   Carollee Leitz MD PGY-3, [facility] Medicine Service pager [phone]

## 2021-05-23 NOTE — Progress Notes (Signed)
FPTS Interim Progress Note  S: Nighttime rounds.  RT finishing up with patient as I approached, reported patient continues to be anxious and declines BiPAP.  RT also reports reduction in O2 liters per minute because the patient complained that it was too loud.  Patient found laying in bed awake and visibly anxious.  She says she cannot breathe well and is very tired, cannot get rest.  Dyspnea wakes her from sleep.  Discussed BiPAP, she continues to decline saying she feels as though it suffocates her.  Her main priority right now is rest, requests something to help her sleep.  O: BP 130/83   Pulse 92   Temp 98.9 F (37.2 C)   Resp 20   Ht '5\' 1"'$  (1.549 m)   Wt 70.3 kg   SpO2 97%   BMI 29.28 kg/m    General: Awake, alert, visibly anxious Respiratory: Lung fields clear to auscultation bilaterally, good air movement, no crackles or rhonchi auscultated Psych: Visibly anxious, reports feelings of confusion ("I thought it was beginning to be light out but it turns out it is dark out now"), limited insight   A/P: Acute hypoxic respiratory distress 2/2 PE with right heart strain: Stable Currently normal SPO2 on 25 L at 40% FiO2 supplemental O2 via HFNC.  Patient continues to refuse BiPAP.  RT very involved, appreciate continued efforts.  No changes to management plan at this time.  Dysphagia SLP evaluated today, recommended n.p.o. except for meds and ice chips as needed.  Adjusted diet ordered to reflect SLP recommendations for p.o. medicine administration.  Anxiety, restlessness, sundowning Patient visibly anxious and reports being confused regarding time of day.  She asks for something to help her sleep.  Given SLP evaluation discussed above, prescribed ramelteon 8 mg nightly.  Also added on delirium precautions.  Ezequiel Essex, MD 05/23/2021, 12:31 AM PGY-2, [place] Medicine Service pager [phone]

## 2021-05-23 NOTE — Progress Notes (Signed)
IP rehab admissions - Patient remains NPO with TNA and on high flow 02.  Not ready for CIR yet.  Call for questions.  [phone]

## 2021-05-24 ENCOUNTER — Inpatient Hospital Stay (HOSPITAL_COMMUNITY): Payer: Medicare Other

## 2021-05-24 ENCOUNTER — Other Ambulatory Visit (HOSPITAL_COMMUNITY): Payer: Self-pay

## 2021-05-24 ENCOUNTER — Other Ambulatory Visit: Payer: Self-pay | Admitting: Family Medicine

## 2021-05-24 DIAGNOSIS — J9601 Acute respiratory failure with hypoxia: Secondary | ICD-10-CM | POA: Diagnosis not present

## 2021-05-24 DIAGNOSIS — J810 Acute pulmonary edema: Secondary | ICD-10-CM | POA: Diagnosis not present

## 2021-05-24 DIAGNOSIS — I2699 Other pulmonary embolism without acute cor pulmonale: Secondary | ICD-10-CM | POA: Diagnosis not present

## 2021-05-24 DIAGNOSIS — E785 Hyperlipidemia, unspecified: Secondary | ICD-10-CM

## 2021-05-24 DIAGNOSIS — K223 Perforation of esophagus: Secondary | ICD-10-CM | POA: Diagnosis not present

## 2021-05-24 LAB — GLUCOSE, CAPILLARY
Glucose-Capillary: 100 mg/dL — ABNORMAL HIGH (ref 70–99)
Glucose-Capillary: 104 mg/dL — ABNORMAL HIGH (ref 70–99)
Glucose-Capillary: 114 mg/dL — ABNORMAL HIGH (ref 70–99)
Glucose-Capillary: 133 mg/dL — ABNORMAL HIGH (ref 70–99)
Glucose-Capillary: 150 mg/dL — ABNORMAL HIGH (ref 70–99)
Glucose-Capillary: 175 mg/dL — ABNORMAL HIGH (ref 70–99)
Glucose-Capillary: 73 mg/dL (ref 70–99)

## 2021-05-24 LAB — CBC
HCT: 29.9 % — ABNORMAL LOW (ref 36.0–46.0)
Hemoglobin: 9.8 g/dL — ABNORMAL LOW (ref 12.0–15.0)
MCH: 30.3 pg (ref 26.0–34.0)
MCHC: 32.8 g/dL (ref 30.0–36.0)
MCV: 92.6 fL (ref 80.0–100.0)
Platelets: 377 10*3/uL (ref 150–400)
RBC: 3.23 MIL/uL — ABNORMAL LOW (ref 3.87–5.11)
RDW: 18.8 % — ABNORMAL HIGH (ref 11.5–15.5)
WBC: 11.1 10*3/uL — ABNORMAL HIGH (ref 4.0–10.5)
nRBC: 1 % — ABNORMAL HIGH (ref 0.0–0.2)

## 2021-05-24 LAB — COMPREHENSIVE METABOLIC PANEL WITH GFR
ALT: 27 U/L (ref 0–44)
AST: 28 U/L (ref 15–41)
Albumin: 1.8 g/dL — ABNORMAL LOW (ref 3.5–5.0)
Alkaline Phosphatase: 62 U/L (ref 38–126)
Anion gap: 8 (ref 5–15)
BUN: 36 mg/dL — ABNORMAL HIGH (ref 8–23)
CO2: 22 mmol/L (ref 22–32)
Calcium: 8.6 mg/dL — ABNORMAL LOW (ref 8.9–10.3)
Chloride: 106 mmol/L (ref 98–111)
Creatinine, Ser: 0.68 mg/dL (ref 0.44–1.00)
GFR, Estimated: >60 mL/min
Glucose, Bld: 108 mg/dL — ABNORMAL HIGH (ref 70–99)
Potassium: 3.8 mmol/L (ref 3.5–5.1)
Sodium: 136 mmol/L (ref 135–145)
Total Bilirubin: 0.4 mg/dL (ref 0.3–1.2)
Total Protein: 5.9 g/dL — ABNORMAL LOW (ref 6.5–8.1)

## 2021-05-24 LAB — HEPARIN LEVEL (UNFRACTIONATED): Heparin Unfractionated: 0.68 IU/mL (ref 0.30–0.70)

## 2021-05-24 MED ORDER — APIXABAN 5 MG PO TABS
10.0000 mg | ORAL_TABLET | Freq: Two times a day (BID) | ORAL | Status: DC
  Administered 2021-05-24 – 2021-05-30 (×12): 10 mg via ORAL
  Filled 2021-05-24 (×15): qty 2

## 2021-05-24 MED ORDER — FUROSEMIDE 10 MG/ML IJ SOLN
40.0000 mg | Freq: Once | INTRAMUSCULAR | Status: AC
  Administered 2021-05-24: 40 mg via INTRAVENOUS
  Filled 2021-05-24: qty 4

## 2021-05-24 MED ORDER — APIXABAN 5 MG PO TABS: 5.0000 mg | ORAL_TABLET | Freq: Two times a day (BID) | ORAL | Status: DC

## 2021-05-24 MED ORDER — TRACE MINERALS CU-MN-SE-ZN 300-55-60-3000 MCG/ML IV SOLN
INTRAVENOUS | Status: AC
  Filled 2021-05-24: qty 745.53

## 2021-05-24 MED ORDER — ACETAMINOPHEN 325 MG PO TABS
650.0000 mg | ORAL_TABLET | ORAL | Status: DC
  Administered 2021-05-24: 650 mg via ORAL
  Filled 2021-05-24: qty 2

## 2021-05-24 MED ORDER — ACETAMINOPHEN 325 MG PO TABS
650.0000 mg | ORAL_TABLET | ORAL | Status: DC
  Administered 2021-05-24 – 2021-05-27 (×15): 650 mg via ORAL
  Filled 2021-05-24 (×13): qty 2

## 2021-05-24 MED ORDER — METOPROLOL TARTRATE 25 MG PO TABS
25.0000 mg | ORAL_TABLET | Freq: Two times a day (BID) | ORAL | Status: DC
  Administered 2021-05-24 – 2021-06-04 (×22): 25 mg via ORAL
  Filled 2021-05-24 (×23): qty 1

## 2021-05-24 NOTE — Progress Notes (Signed)
[place] for heparin Indication: atrial fibrillation and pulmonary embolus  No Known Allergies  Patient Measurements: Height: '5\' 1"'$  (154.9 cm) Weight: 70.4 kg (155 lb 3.3 oz) IBW/kg (Calculated) : 47.8 Heparin Dosing Weight: 63 kg  Vital Signs: Temp: 98 F (36.7 C) (08/12 0800) Temp Source: Oral (08/12 0800) BP: 125/79 (08/12 0800) Pulse Rate: 101 (08/12 0800)  Labs: Recent Labs    05/22/21 0554 05/23/21 0434 05/24/21 0500  HGB 9.9* 9.6* 9.8*  HCT 30.5* 29.3* 29.9*  PLT 382 357 377  HEPARINUNFRC 0.33 0.30 0.68  CREATININE 0.76 0.75 0.68     Estimated Creatinine Clearance: 51.1 mL/min (by C-G formula based on SCr of 0.68 mg/dL).   Medical History: Past Medical History:  Diagnosis Date   Allergy    occ uses OTC allergy meds    Arthritis    fingers   Breast cancer ([place]) 04/2008   Right breast   Cataracts, bilateral    removed bilat    Cervical cancer ([place])    When the patient was in her 28s   Depression    Diabetes mellitus without complication ([place])    Heart attack ([place]) 2004   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   Hyperlipidemia    Hypertension    Personal history of radiation therapy 2009   Respiratory failure ([place]) 05/04/2021    Medications:  Medications Prior to Admission  Medication Sig Dispense Refill Last Dose   [EXPIRED] acetaminophen (TYLENOL) 500 MG tablet Take 2 tablets (1,000 mg total) by mouth every 8 (eight) hours for 5 days. 30 tablet 0 05/04/2021   amLODipine (NORVASC) 10 MG tablet TAKE 1 TABLET BY MOUTH EVERY DAY (Patient taking differently: Take 10 mg by mouth daily.) 90 tablet 3 05/04/2021   aspirin EC 81 MG tablet Take 81 mg by mouth in the morning. Swallow whole.   05/04/2021   Cholecalciferol (VITAMIN D3) 50 MCG (2000 UT) TABS Take 2,000 Units by mouth in the morning.   05/04/2021   hydrochlorothiazide (HYDRODIURIL) 12.5 MG tablet TAKE 1 TABLET BY MOUTH EVERY DAY (Patient taking differently: Take 12.5 mg by  mouth daily.) 90 tablet 3 05/04/2021   JARDIANCE 10 MG TABS tablet TAKE 1 TABLET BY MOUTH EVERY DAY (Patient taking differently: Take 10 mg by mouth daily.) 90 tablet 3 05/04/2021   LANTUS SOLOSTAR 100 UNIT/ML Solostar Pen INJECT 24 UNITS INTO THE SKIN EVERY MORNING. (Patient taking differently: Inject 24 Units into the skin daily.) 3 mL 9 05/04/2021   losartan (COZAAR) 50 MG tablet Take 50 mg by mouth in the morning.   05/04/2021   metFORMIN (GLUCOPHAGE-XR) 500 MG 24 hr tablet TAKE 1 TABLET BY MOUTH TWICE A DAY WITH BREAKFAST AND DINNER (Patient taking differently: Take 500 mg by mouth 2 (two) times daily.) 180 tablet 0 05/04/2021   nitroGLYCERIN (NITROSTAT) 0.4 MG SL tablet Place 1 tablet (0.4 mg total) under the tongue every 5 (five) minutes as needed for chest pain. (Patient taking differently: Place 0.4 mg under the tongue every 5 (five) minutes x 3 doses as needed for chest pain.) 25 tablet 1 unknown   omeprazole (PRILOSEC) 40 MG capsule Take 40 mg by mouth every morning.   05/04/2021   rosuvastatin (CRESTOR) 20 MG tablet TAKE 1 TABLET BY MOUTH EVERYDAY AT BEDTIME (Patient taking differently: Take 20 mg by mouth at bedtime.) 90 tablet 3 05/03/2021   [EXPIRED] traMADol (ULTRAM) 50 MG tablet Take 1 tablet (50 mg total) by mouth every 6 (six) hours  as needed for up to 5 days. 15 tablet 0 05/04/2021   Blood Glucose Monitoring Suppl MISC One touch ultra glucose monitor. Check blood sugar twice a day.      glucose blood (ONE TOUCH ULTRA TEST) test strip CHECK BLOOD SUGAR TWICE DAILY 100 each 1    Insulin Pen Needle (B-D UF III MINI PEN NEEDLES) 31G X 5 MM MISC INJECT INSULIN VIA PEN 6 TIMES DAILY 200 each 3    OneTouch Delica Lancets [id number] MISC 1 Container by Does not apply route as needed. 100 each PRN     Assessment: 80 yo female continues on heparin for acute PE/afib. Patient with complicated hospital course including empyema, acute renal failure, NSTEMI, and PE.  Currently on TPN with plans for [facility] 8/12.   She was not on anticoagulation PTA.  Heparin level is now near upper end of goal range on 1700 units/hr.  No evidence of bleeding.   Goal of Therapy:  Heparin level 0.3-0.7 units/ml Monitor platelets by anticoagulation protocol: Yes   Plan:  -Decrease heparin to 1650 units/hr -Daily heparin level and CBC -Follow-up swallow eval and opportunity to transition to oral anticoagulation  [employer], Pharm.D., BCPS Clinical Pharmacist  **Pharmacist phone directory can be found on [email or web address] listed under [place].  05/24/2021 8:58 AM

## 2021-05-24 NOTE — Progress Notes (Signed)
FPTS Interim Night Progress Note  S:Patient sleeping comfortably.  Rounded with primary night RN.  No concerns voiced.  No orders required.    O: Today's Vitals   05/24/21 0500 05/24/21 0600 05/24/21 0800 05/24/21 0801  BP: (!) 140/105  125/79   Pulse:   (!) 101   Resp: (!) 25 (!) 27 (!) 22   Temp:   98 F (36.7 C)   TempSrc:   Oral   SpO2: (!) 89% 93% 100% 100%  Weight: 70.4 kg     Height:      PainSc:          A/P: Continue current management  Carollee Leitz MD PGY-3, [place] Medicine Service pager [phone]

## 2021-05-24 NOTE — Progress Notes (Signed)
[address]       [place] [address]             [phone]      19 Days Post-Op Procedure(s) (LRB): THORACOTOMY MAJOR REPAIR PERFORATED ESOPHAGUS (Right) Subjective: Awake and alert, says "I think I'm getting better".  Able to tolerate ice cream with no swallowing difficulties yesterday per RN. No new concerns.   Objective: Vital signs in last 24 hours: Temp:  [97.4 F (36.3 C)-98.1 F (36.7 C)] 98 F (36.7 C) (08/12 0800) Pulse Rate:  [83-101] 101 (08/12 0800) Cardiac Rhythm: Sinus tachycardia;Bundle branch block (08/12 0907) Resp:  [20-32] 22 (08/12 0800) BP: (116-140)/(71-105) 125/79 (08/12 0800) SpO2:  [89 %-100 %] 100 % (08/12 0801) FiO2 (%):  [30 %-40 %] 30 % (08/12 0500) Weight:  [70.4 kg] 70.4 kg (08/12 0500)    Intake/Output from previous day: 08/11 0701 - 08/12 0700 In: 2323.1 [P.O.:10; I.V.:1885.9; IV Piggyback:427.2] Out: 950 [Urine:950] Intake/Output this shift: No intake/output data recorded.  General appearance: cooperative, and no distress Heart: regular rate and rhythm Lungs: breath sounds clear anterior.  Abdomen: soft, non tender or distended Extremities: no edema Wound: incis healing well  Lab Results: Recent Labs    05/23/21 0434 05/24/21 0500  WBC 11.0* 11.1*  HGB 9.6* 9.8*  HCT 29.3* 29.9*  PLT 357 377    BMET:  Recent Labs    05/23/21 0434 05/24/21 0500  NA 134* 136  K 3.5 3.8  CL 103 106  CO2 23 22  GLUCOSE 144* 108*  BUN 34* 36*  CREATININE 0.75 0.68  CALCIUM 8.3* 8.6*     PT/INR: No results for input(s): LABPROT, INR in the last 72 hours. ABG    Component Value Date/Time   PHART 7.420 05/22/2021 0735   HCO3 20.2 05/22/2021 0735   TCO2 29 05/07/2021 0516   ACIDBASEDEF 3.5 (H) 05/22/2021 0735   O2SAT 91.1 05/22/2021 0735   CBG (last 3)  Recent Labs    05/24/21 0028 05/24/21 0451 05/24/21 0759  GLUCAP 104* 100* 114*     Meds Scheduled Meds:  sodium chloride   Intravenous Once    [START ON 05/25/2021] acetaminophen  650 mg Oral Q4H   chlorhexidine  15 mL Mouth Rinse BID   Chlorhexidine Gluconate Cloth  6 each Topical Daily   feeding supplement  237 mL Oral BID BM   Gerhardt's butt cream  1 application Topical QID   insulin aspart  0-24 Units Subcutaneous Q4H   lidocaine  1 patch Transdermal Q24H   mouth rinse  15 mL Mouth Rinse q12n4p   metoprolol tartrate  25 mg Oral BID   pantoprazole (PROTONIX) IV  40 mg Intravenous QHS   ramelteon  8 mg Oral QHS   sodium chloride flush  10-40 mL Intracatheter Q12H   Continuous Infusions:  sodium chloride 10 mL/hr at 05/23/21 0500   anidulafungin 100 mg (05/24/21 0927)   heparin 1,650 Units/hr ([id number])   TPN CYCLIC-ADULT (ION) 53 mL/hr at [id number]   TPN CYCLIC-ADULT (ION)     PRN Meds:.sodium chloride, fentaNYL (SUBLIMAZE) injection, ondansetron (ZOFRAN) IV, sodium chloride, sodium chloride flush  Xrays No results found.  Assessment/Plan: S/P Procedure(s) (LRB): THORACOTOMY MAJOR REPAIR PERFORATED ESOPHAGUS (Right)  1 afeb, VSS 2 sats good , weaned to 15 l HFNC this morning with stable SaO2. 3 conts to diurese well, normal renal fxn 4 H/H stable 5 min elev WBC- remains stable 6 cont TPN,  repeat MBS planned for today to determine if diet may be advanced.  7 heparin gtt, sinus rhythm currently with bifascicular block, periop MI/PE  cardiology has left recs and signed off - will need long term ACRX plan determined 8. Noted mention of "complex right pleural effusion compatible with empyema" on CTA done 8/8.   Images reviewed, has small effusion with loculations and with thickened pleura consistent with recent esophageal rupture and subsequent gross contamination of the right hemithorax.  She is not febrile and WBC minimally elevated.  Hypoxia is slowly improving and suspect is primarily related to the large PE.  Would not advise surgical intervention in the setting of recent MI and PE. Will continue to follow.    Will remove the chest tube site sutures and every other staple from the thoracotomy incision today.     LOS: 20 days    Antony Odea, PA-C Pager [phone] 05/24/2021

## 2021-05-24 NOTE — Progress Notes (Signed)
NAME:  Dana Coleman, MRN:  [id number], DOB:  Jul 11, 1941, LOS: 72 ADMISSION DATE:  05/04/2021, CONSULTATION DATE:  7/23 REFERRING MD:  Ralene Bathe, CHIEF COMPLAINT:  Dyspnea   History of Present Illness:  80 y/o female admitted for empyema after a lap chole, noted to have esophageal perforation. Underwent repair, course complicated by non-STEMI and acute PE  Pertinent  Medical History  DM2 CAD Hypertension Hyperlipidemia Prior R breast cancer  Significant Hospital Events: Including procedures, antibiotic start and stop dates in addition to other pertinent events   7/23 admitted intubated. Pigtail by CCS in ED -- interestingly seems to have bilious outpt 7/24 Starting CRRT. Abundant GPCs in pleural fluid 7/24 1.  Right thoracotomy 2.  Repair of esophageal perforation with intercostal pedicled muscle flap. 3.  Drainage of empyema and decortication of right lung 7/25 early AM EKG changes, trop leak, probable inferiror NSTEMI, not candidate for intervention.  Echo with mildly reduced LV, large RV ?cause 7/26 extubated 7/29 up out of bed, rising WBC 7/30 blood culture >  7/31 WBC up again, cough with mucus production, chest pain 8/1 elevated WBC, added Vanco, MRSA swab pending, CT stripped from clot by [facility]  8/2 pain control, OOB, passed swallow, started clears  8/3 mobility  8/8 increased O2 needs, PE on imaging, reconsulted.  CTA chest shows filling defects in right middle lobe right lower lobe and left upper lobe, RV/LV ratio 1.2 8/9 on 25 L / 50% high flow nasal cannula ,diuresed 8/9 echo showed EF 25%, cardiology consulted 8/10 Received 2 doses of Ativan and was confused ,Episodes of choking with pills, made n.p.o.  Interim History / Subjective:   Net positive fluid balance. Still feeling dyspneic but O2 requirement improving slightly.  Objective   Blood pressure 125/79, pulse (!) 101, temperature 98 F (36.7 C), temperature source Oral, resp. rate (!) 22, height '5\' 1"'$  (1.549  m), weight 70.4 kg, SpO2 100 %.    FiO2 (%):  [30 %-40 %] 30 %   Intake/Output Summary (Last 24 hours) at 05/24/2021 1113 Last data filed at 05/24/2021 [id number] Gross per 24 hour  Intake 2323.1 ml  Output 950 ml  Net 1373.1 ml   Filed Weights   05/22/21 0350 05/23/21 0354 05/24/21 0500  Weight: 70.3 kg 70 kg 70.4 kg    Examination: General appearance: frail 80 y.o., female, NAD, conversant  HENT: NCAT; dry MM Neck: no lymphadenopathy, no JVD Lungs: Diminished bilaterally, tachypneic CV: RRR, no MRGs  Abdomen: Soft, non-tender; non-distended, BS present  Extremities: No peripheral edema, radial and DP pulses present bilaterally  Skin: Normal temperature, turgor and texture; no rash Psych: Appropriate affect Neuro: Alert and oriented to person and place, no focal deficit but weak globally  No new imaging  Labs remarkable for stable renal function, hypoalbuminemia, stable mild leukocytosis and anemia  Resolved Hospital Problem list   Bleeding from chest tube, small hemothorax, clot and chest tube (improved)  AKI   Assessment & Plan:  Acute hypoxemic respiratory failure- secondary to volume overload, third spacing from poor nutrition w/ pleural effusions, interlobular septal thickening, new  acute PE.   Gen weakness and poor cough mechanics also contributing.  -Would switch to lovenox to minimize fluid intake, more predictable pharmacokinetics now that she's had some renal recovery -Would diurese for net negative fluid balance today -Encourage IS -Tolerating HFNC, did not tolerate BiPAP -Remains tachypneic  Ischemic cardiomyopathy, EF 25%  NSTEMI earlier in stay - - Push diuresis as above -Once able  to tolerate p.o., will need to start Entresto, beta-blocker then and Aldactone and SGLT2 inhibitors   Reactive Afib/ RVR , currently in sinus rhythm -metoprolol 25 po BID  Post op primary esophageal repair for primary esophageal rupture with polymicrobial empyema  (05/05/21) -Continue anidulafungin -unclear duration -If hemodynamic deterioration, fever would reculture and restart ABX including anaerobic coverage -Await identification of yeast in pleural peel  Dysphagia - eso 8/1 did not show any leak MBS later this morning  Profound muscular deconditioning and malnutrition- on TPN due to inability to keep up with nutritional demands -PT recommends CIR eventually   Limited CODE STATUS, no CPR, no intubation , appreciate palliative care input Hopeful that hypoxia will improve gradually with diuresis and supportive care  [place]   If no response to pager , please call [phone] until 7 pm After 7:00 pm call Elink  [phone]   05/24/2021

## 2021-05-24 NOTE — Progress Notes (Signed)
Physical Therapy Treatment Patient Details Name: Dana Coleman MRN: [id number] DOB: Dec 20, 1940 Today's Date: 05/24/2021    History of Present Illness Pt is a 80 yo female admitted on 05/04/21 for AMS. HX of cholecystectomy on 7/21.  Intubated upon admission, 7/24 Started CRRT, Esophageal perforation with septic shock post flap repair, drainage of empyema and decortication of R lung with chest tube placed.  7/25 EKG changes and probable nstemi. Extubated 7/26. CT removed 8/4. Pt with new PE with R heart strain and increased O2 HFNC requirements 8/8. PMH includes: R breast CA, DM, CAD, HTN.    PT Comments    Pt making steady progress and able to tolerate OOB to chair this session. Plan to work toward progressing gait next session.   Follow Up Recommendations  CIR     Equipment Recommendations  Rolling walker with 5" wheels;Wheelchair (measurements PT);Wheelchair cushion (measurements PT)    Recommendations for Other Services       Precautions / Restrictions Precautions Precautions: Fall Precaution Comments: watch respiratory status    Mobility  Bed Mobility Overal bed mobility: Needs Assistance Bed Mobility: Supine to Sit   Sidelying to sit: Mod assist       General bed mobility comments: Assist to elevate trunk into sitting and bring hips to EOB    Transfers Overall transfer level: Needs assistance Equipment used: Rolling walker (2 wheeled) Transfers: Sit to/from Stand Sit to Stand: Min assist;+2 safety/equipment Stand pivot transfers: Min assist;+2 safety/equipment       General transfer comment: Assist to bring hips up and for balance. Pt took quick pivotal steps to chair and sat quickly.  Ambulation/Gait             General Gait Details: Deferred due to poor activity tolerance   Stairs             Wheelchair Mobility    Modified Rankin (Stroke Patients Only)       Balance Overall balance assessment: Needs assistance Sitting-balance  support: No upper extremity supported;Feet supported Sitting balance-Leahy Scale: Fair     Standing balance support: Bilateral upper extremity supported Standing balance-Leahy Scale: Poor Standing balance comment: walker and min assist for static standing                            Cognition Arousal/Alertness: Awake/alert Behavior During Therapy: WFL for tasks assessed/performed;Flat affect Overall Cognitive Status: Impaired/Different from baseline Area of Impairment: Problem solving;Following commands;Memory                     Memory: Decreased short-term memory Following Commands: Follows one step commands consistently     Problem Solving: Slow processing;Requires verbal cues        Exercises      General Comments General comments (skin integrity, edema, etc.): Pt on 12L O2 at rest with SpO2 100%. Transferred with O2 at 15L with SpO2 remaining 100%      Pertinent Vitals/Pain Pain Assessment: Faces Faces Pain Scale: Hurts a little bit Pain Location: generalized discomfort Pain Descriptors / Indicators: Grimacing Pain Intervention(s): Limited activity within patient's tolerance;Repositioned    Home Living                      Prior Function            PT Goals (current goals can now be found in the care plan section) Progress towards PT goals: Progressing toward goals  Frequency    Min 3X/week      PT Plan Current plan remains appropriate    Co-evaluation              AM-PAC PT "6 Clicks" Mobility   Outcome Measure  Help needed turning from your back to your side while in a flat bed without using bedrails?: A Lot Help needed moving from lying on your back to sitting on the side of a flat bed without using bedrails?: A Lot Help needed moving to and from a bed to a chair (including a wheelchair)?: A Little Help needed standing up from a chair using your arms (e.g., wheelchair or bedside chair)?: A Little Help needed  to walk in hospital room?: Total Help needed climbing 3-5 steps with a railing? : Total 6 Click Score: 12    End of Session Equipment Utilized During Treatment: Gait belt;Oxygen Activity Tolerance: Patient limited by fatigue Patient left: in chair;with call bell/phone within reach;with chair alarm set Nurse Communication: Mobility status PT Visit Diagnosis: Other abnormalities of gait and mobility (R26.89);Difficulty in walking, not elsewhere classified (R26.2)     Time: [id number] PT Time Calculation (min) (ACUTE ONLY): 27 min  Charges:  $Therapeutic Activity: 23-37 mins                     [place] Pager [phone] Office [place] 05/24/2021, 5:37 PM

## 2021-05-24 NOTE — Progress Notes (Signed)
Modified Barium Swallow Progress Note  Patient Details  Name: Dana Coleman MRN: [id number] Date of Birth: [birth year]  Today's Date: 05/24/2021  Modified Barium Swallow completed.  Full report located under Chart Review in the Imaging Section.  Brief recommendations include the following:  Clinical Impression  Pt has a mild oral dysphagia felt to be primarily related to generalized deconditioning and fatigue. She needs rest breaks between boluses and has some increase in shortness of breath. She has reduced lingual propulsion, piecemeal swallowing, and prolonged mastication, although it should also be noted that she did not have her dentures placed for this study as they did not come down from her room. She tired quickly with solids and needs liquid washes to clear her oral cavity. Thin liquids are spilled back toward her pharynx as she is working on oral transit, with swallow typically triggering at the pyriform sinuses, but she maintains good airway protection throughout testing. No aspiration or pharyngeal residue is noted. Pt verbalized that she would prefer a softer diet to conserve her energy. Options were reviewed with her, and she would like to start with a pureed diet with thin liquids. Her prognosis for advancement is good as her overall energy levels improve, and likely with placement of her dentures as well. Would optimize upright positioning and take frequent breaks PRN for respirations.   Swallow Evaluation Recommendations       SLP Diet Recommendations: Dysphagia 1 (Puree) solids;Thin liquid   Liquid Administration via: Cup;Straw   Medication Administration: Crushed with puree   Supervision: Staff to assist with self feeding   Compensations: Slow rate;Small sips/bites   Postural Changes: Seated upright at 90 degrees;Remain semi-upright after after feeds/meals (Comment)   Oral Care Recommendations: Oral care BID        Osie Bond., M.A. [place] [facility] Pager [phone] Office [phone]  05/24/2021,1:47 PM

## 2021-05-24 NOTE — Progress Notes (Signed)
RT NOTES: Removed from heated high flow and placed on 15L [place] high flow nasal cannula. Sats 100%. Will continue to monitor.

## 2021-05-24 NOTE — Progress Notes (Signed)
PHARMACY - TOTAL PARENTERAL NUTRITION CONSULT NOTE  Indication: s/p esophageal perforation and repair  Patient Measurements: Height: '5\' 1"'$  (154.9 cm) Weight: 70.4 kg (155 lb 3.3 oz) IBW/kg (Calculated) : 47.8   Body mass index is 29.33 kg/m.  Assessment:  80 y/o female with recent lap chole for symptomatic cholelithiasis on 7/21. PMH including HTN, HLD, DM, R breast cancer, and CAD. Patient presented on 7/23 with AMS and unresponsiveness - was complaining of abdominal discomfort and then patient became unresponsive and slumped on table.  S/p gall bladder surgery and esophageal perforation-repaired 7/24.  Pharmacy consulted for TPN management.  Glucose / Insulin: DM with A1c 7.4% on Lantus 24/d PTA.  CBGs 71-135 Utilized 8 units SSI + 90 units insulin in TPN   Electrolytes: CoCa 10.36. Others WNL. Renal: septic ATN s/p CRRT (end 7/25) - SCr < 1 and stable, BUN up to 36 Hepatic: LFTs significantly increased 8/4 (decreased with holding TPN for 6 hours and Lasix), tbili WNL, albumin down 1.8, prealbumin up 8.9, TG WNL Intake / Output; MIVF:  UOP 950 (Lasix LD 8/10), I/O +10.9, LBM 8/12  GI Imaging: - 7/23 CT: no acute abnormalities  GI Surgeries / Procedures:  - 7/21: s/p laparoscopic cholecystectomy and LOA - 7/24: s/p R thoracotomy, repair of esophageal perf with muscle flap, drainage of empyema and decortication of R lung  Central access: PICC 7/26 (double lumen) TPN start date: 05/07/21  Nutritional Goals (per RD rec on 8/9): kCal: 1750-2030 , Protein: 90-120 , Fluid: >=1.8L  Current Nutrition:  TPN Ensure Enlive BID - none charted given Soft diet on 8/6  >> NPO since 8/8 No feeding tube recommended due to esophageal injury  Plan:  Continue concentrated cyclic TPN - infuse 123456 mL over 16 hrs: 53 mL/hr x 1 hr, then 105 mL/hr x 14 hrs, then 53 mL/hr x 1 hr. TPN provides 111g AA, 230g CHO, and 58g ILE for total of 1803 kCal, meeting 100% of patient's needs. Electrolytes in  TPN: Na 30 mEq/L (= 47 mEq), K 30 mEq/L (= 47 mEq), Ca 59mq/L since 7/30, Mg 11 mEq/L (= 17 mEq), Phos 30 mmol/L (= 47 mmol), maximum acetate Add standard MVI and trace elements to TPN Continue CVTS SSI Q4H and adjust insulin in TPN down to 90 units Monitor TPN labs on Mon/Thurs  and PRN. Will not recheck tomorrow.   Dezzie Badilla S. RAlford Highland PharmD, BCPS Clinical Staff Pharmacist [email or web address]  05/24/2021 8:05 AM

## 2021-05-24 NOTE — TOC Benefit Eligibility Note (Signed)
Patient Teacher, English as a foreign language completed.    The patient is currently admitted and upon discharge could be taking Eliquis 5 mg.  The current 30 day co-pay is, $0.00.   The patient is insured through [employer], [place] Patient Advocate Specialist [place] Team Direct Number: [phone]  Fax: [phone]

## 2021-05-24 NOTE — Progress Notes (Signed)
SLP Note:   Per chart, pt is now on 15L HFNC. Radiology says they can accommodate this; RN in agreement as well. MBS scheduled for later this morning pending any further changes in respiratory status.     Osie Bond., M.A. [place] Acute Environmental education officer [phone] Office [phone]

## 2021-05-24 NOTE — Progress Notes (Signed)
Family Medicine Teaching Service Daily Progress Note Intern Pager: [phone]  Patient name: Dana Coleman Medical record number: [id number] Date of birth: March 25, 1941 Age: 80 y.o. Gender: female  Primary Care Provider: Gifford Shave, MD Consultants: Palliative medicine, CCM, CVTS, cardiology (s/o), general surgery (s/o), nephrology (s/o) Code Status: Partial: No CPR, no intubation  Pt Overview and Major Events to Date:  7/23: right pigtail catheter placed, admitted to ICU, intubated 7/24: CRRT, emergent right thoracotomy, repair of esophageal perforation with intercostal pedicled muscle flap and drainage of empyema and decortication of right lung 7/25: probable, inferior NSTEMI, no acute intervention. Echo with mildly reduced LV, large RV 7/26: extubated, double-lumen PICC placed, TPN started 8/1: Esophagram without leak, started on PO, heparin gtt stopped to increased bleeding from chest tubes. Declotted by CVTS. Transfused 1u pRBC 8/3: anterior chest tube removed 8/4: remaining chest tube removed 8/8: increased O2 requirement, Afib RVR -> PE with right heart strain 8/9: further increased O2 requirement, Afib   Abx timeline Zosyn: 7/23-7/26 Eraxis: 7/23-8/12 Unasyn: 7/26-7/31 Meropenem: 7/31-8/3 Ceftriaxone: 8/3-8/11 Metronidazole: 8/3-8/11   Micro Timeline 7/23: MRSA PCR negative 7/23: pleural fluid grew strep salivarius pan-sensitive, strep mitas (sensitive CTX), rare candida albicans 7/24: blood cultures negative x2 7/30: blood cultures negative x2  Assessment and Plan: Dana Coleman is a 80 year old female who was admitted on 7/23 for AMS and SOB, subsequently found to have acute renal failure, esophageal perforation now POD #19 from repair.  Admission was complicated by probable inferior NSTEMI, acute blood loss anemia requiring 1u pRBC, A. fib with RVR with consequent PE with right heart strain.  Past medical history significant T1DM, CAD, HTN, HLD, MI (2004),  Hurthle Cell Adenoma (2004), cataracts, cervical cancer.  Esophageal perforation s/p repair  empyema POD #19, patient is feeling similar to yesterday in that she does not have pain.  WBC continues to remain stable at 11.1.  Today is last day of antifungal and antibiotics have finished their course.  Patient continues to seem well controlled with pain except overnight was uncomfortable and said she was unable to be allowed to have medication.  CVTS does not advise surgical intervention of empyema in the setting of recent MI and PE but will continue to follow.  He will be removing the chest tube site sutures and staples from thoracotomy incision today. -Last day of Eraxis '100mg'$  daily -Antibiotic course completed -Continue Tylenol '650mg'$  q4h scheduled -Continue ramelteon 8 mg daily at bedtime for assistance with sleeping  A. fib with RVR  PE with right heart strain  hypoxia On HFNC 15 L 30%.  Conversation on rounds discussed with discontinuing heparin and switching to Lovenox or DOAC as heparin may be increasing her fluid status.  This is dependent upon modified barium swallow later today.  She appears to be gaining weight steadily and BUN increasing.  Will aim to diurese to assist respiratory recovery in addition to net negative fluid balance. -Start Lasix 40 mg IV once.  May continue in the future. -Strict I/Os -CVTS and CCM continue to follow, appreciate recommendations -RT continues to be involved for respiratory status, appreciate continued efforts -Palliative care continue to be involved in family discussion for patient's care   FEN/GI: N.p.o. except medicine, TPN PPx: Heparin 1700 units/h IV ; may change in the near future, see above note Dispo:CIR pending clinical improvement . Barriers include clinical status.   Subjective:  Patient's oxygen requirement has decreased today.  She still appears short of breath and states that she is tired as  she has not been able to get as much sleep.   She states she wanted something for discomfort last night but was told she cannot have anything.  Objective: Temp:  [97.4 F (36.3 C)-98.1 F (36.7 C)] 97.7 F (36.5 C) (08/12 0400) Pulse Rate:  [83-92] 92 (08/12 0400) Resp:  [20-32] 27 (08/12 0600) BP: (116-140)/(71-105) 140/105 (08/12 0500) SpO2:  [89 %-100 %] 93 % (08/12 0600) FiO2 (%):  [30 %-40 %] 30 % (08/12 0500) Weight:  [70.4 kg] 70.4 kg (08/12 0500) Physical Exam: General: Uncomfortable and tired appearing, able to have minimal conversation Cardiovascular: RRR, no murmurs auscultated Respiratory: Diminished lung sounds on the right, no wheezing or crackles Abdomen: Soft, nontender, normoactive bowel sounds Extremities: No pitting edema noted in all 4 extremities, 2+ radial and tibialis pulses  Laboratory: Recent Labs  Lab 05/22/21 0554 05/23/21 0434 05/24/21 0500  WBC 11.6* 11.0* 11.1*  HGB 9.9* 9.6* 9.8*  HCT 30.5* 29.3* 29.9*  PLT 382 357 377   Recent Labs  Lab 05/21/21 0352 05/22/21 0554 05/23/21 0434  NA 134* 134* 134*  K 3.1* 3.8 3.5  CL 101 103 103  CO2 22 20* 23  BUN 31* 34* 34*  CREATININE 0.79 0.76 0.75  CALCIUM 8.3* 8.4* 8.3*  PROT 6.4* 6.2* 5.8*  BILITOT 0.4 0.6 0.4  ALKPHOS 72 70 65  ALT 43 37 30  AST 30 32 28  GLUCOSE 216* 175* 144*    Imaging/Diagnostic Tests: No new images/studies at this time. Modified barium swallow to be performed today  Wells Guiles, DO 05/24/2021, 6:28 AM PGY-1, [place] Intern pager: [phone], text pages welcome

## 2021-05-25 DIAGNOSIS — Z789 Other specified health status: Secondary | ICD-10-CM

## 2021-05-25 DIAGNOSIS — Z7409 Other reduced mobility: Secondary | ICD-10-CM

## 2021-05-25 DIAGNOSIS — Z9889 Other specified postprocedural states: Secondary | ICD-10-CM

## 2021-05-25 DIAGNOSIS — K223 Perforation of esophagus: Secondary | ICD-10-CM | POA: Diagnosis not present

## 2021-05-25 DIAGNOSIS — R5381 Other malaise: Secondary | ICD-10-CM

## 2021-05-25 DIAGNOSIS — J810 Acute pulmonary edema: Secondary | ICD-10-CM | POA: Diagnosis not present

## 2021-05-25 DIAGNOSIS — I214 Non-ST elevation (NSTEMI) myocardial infarction: Secondary | ICD-10-CM

## 2021-05-25 DIAGNOSIS — I2699 Other pulmonary embolism without acute cor pulmonale: Secondary | ICD-10-CM | POA: Diagnosis not present

## 2021-05-25 DIAGNOSIS — J9601 Acute respiratory failure with hypoxia: Secondary | ICD-10-CM | POA: Diagnosis not present

## 2021-05-25 HISTORY — DX: Other specified health status: Z78.9

## 2021-05-25 LAB — COMPREHENSIVE METABOLIC PANEL
ALT: 26 U/L (ref 0–44)
AST: 28 U/L (ref 15–41)
Albumin: 1.9 g/dL — ABNORMAL LOW (ref 3.5–5.0)
Alkaline Phosphatase: 59 U/L (ref 38–126)
Anion gap: 9 (ref 5–15)
BUN: 39 mg/dL — ABNORMAL HIGH (ref 8–23)
CO2: 20 mmol/L — ABNORMAL LOW (ref 22–32)
Calcium: 8.5 mg/dL — ABNORMAL LOW (ref 8.9–10.3)
Chloride: 106 mmol/L (ref 98–111)
Creatinine, Ser: 0.78 mg/dL (ref 0.44–1.00)
GFR, Estimated: >60 mL/min (ref >60)
Glucose, Bld: 115 mg/dL — ABNORMAL HIGH (ref 70–99)
Potassium: 4 mmol/L (ref 3.5–5.1)
Sodium: 135 mmol/L (ref 135–145)
Total Bilirubin: 0.4 mg/dL (ref 0.3–1.2)
Total Protein: 6 g/dL — ABNORMAL LOW (ref 6.5–8.1)

## 2021-05-25 LAB — GLUCOSE, CAPILLARY
Glucose-Capillary: 107 mg/dL — ABNORMAL HIGH (ref 70–99)
Glucose-Capillary: 95 mg/dL (ref 70–99)
Glucose-Capillary: 82 mg/dL (ref 70–99)
Glucose-Capillary: 103 mg/dL — ABNORMAL HIGH (ref 70–99)
Glucose-Capillary: 111 mg/dL — ABNORMAL HIGH (ref 70–99)

## 2021-05-25 MED ORDER — FUROSEMIDE 10 MG/ML IJ SOLN
40.0000 mg | Freq: Two times a day (BID) | INTRAMUSCULAR | Status: AC
  Administered 2021-05-25 (×2): 40 mg via INTRAVENOUS
  Filled 2021-05-25 (×2): qty 4

## 2021-05-25 MED ORDER — CLINISOL SF 15 % IV SOLN
INTRAVENOUS | Status: AC
  Filled 2021-05-25: qty 745.53

## 2021-05-25 MED ORDER — SACUBITRIL-VALSARTAN 24-26 MG PO TABS
1.0000 | ORAL_TABLET | Freq: Two times a day (BID) | ORAL | Status: DC
  Administered 2021-05-25 – 2021-06-04 (×20): 1 via ORAL
  Filled 2021-05-25 (×21): qty 1

## 2021-05-25 NOTE — Progress Notes (Signed)
FPTS Interim Night Progress Note  S:Patient sleeping comfortably.  Rounded with primary night RN.  Now on oxygen 9L.  No concerns voiced.  No orders required.    O: Today's Vitals   05/25/21 0000 05/25/21 0200 05/25/21 0400 05/25/21 0451  BP: 121/70 108/72 118/72   Pulse:      Resp: '20 20 20 20  '$ Temp:   97.6 F (36.4 C)   TempSrc:   Oral   SpO2: 100% 98% 100% 98%  Weight:    68.8 kg  Height:      PainSc:          A/P: Continue to monitor respiratory status  Carollee Leitz MD PGY-3, [place] Medicine Service pager [phone]

## 2021-05-25 NOTE — Progress Notes (Addendum)
PHARMACY - TOTAL PARENTERAL NUTRITION CONSULT NOTE  Indication: s/p esophageal perforation and repair  Patient Measurements: Height: '5\' 1"'$  (154.9 cm) Weight: 68.8 kg (151 lb 10.8 oz) IBW/kg (Calculated) : 47.8   Body mass index is 28.66 kg/m.  Assessment:  80 y/o female with recent lap chole for symptomatic cholelithiasis on 7/21. PMH including HTN, HLD, DM, R breast cancer, and CAD. Patient presented on 7/23 with AMS and unresponsiveness - was complaining of abdominal discomfort and then patient became unresponsive and slumped on table.  S/p gall bladder surgery and esophageal perforation-repaired 7/24.  Pharmacy consulted for TPN management.  Glucose / Insulin: DM with A1c 7.4% on Lantus 24/d PTA.  CBGs 73-175 (running lower mid-day x 3d) Utilized 8 units SSI + 90 units insulin in TPN   Electrolytes: CoCa 10.18. Others WNL. Renal: septic ATN s/p CRRT (end 7/25) - SCr < 1 and stable, BUN up to 39 Hepatic: LFTs significantly increased 8/4 (decreased with holding TPN for 6 hours and Lasix), tbili WNL, albumin down 1.9, prealbumin up 8.9, TG WNL Intake / Output; MIVF:  UOP 1300 (Lasix LD 8/12), I/O +9.7, LBM 8/12 - 5% meal intake: 101m documented + Ensure x 1 charted  GI Imaging: - 7/23 CT: no acute abnormalities  GI Surgeries / Procedures:  - 7/21: s/p laparoscopic cholecystectomy and LOA - 7/24: s/p R thoracotomy, repair of esophageal perf with muscle flap, drainage of empyema and decortication of R lung  Central access: PICC 7/26 (double lumen) TPN start date: 05/07/21  Nutritional Goals (per RD rec on 8/9): kCal: 1750-2030 , Protein: 90-120 , Fluid: >=1.8L  Current Nutrition:  TPN Ensure Enlive BID  8/12 Dysphagia 1 diet No feeding tube recommended due to esophageal injury   Plan:  Con't TPN until po intake improves Continue concentrated cyclic TPN - infuse 1123456mL over 16 hrs: 53 mL/hr x 1 hr, then 105 mL/hr x 14 hrs, then 53 mL/hr x 1 hr. TPN provides 111g AA, 230g  CHO, and 58g ILE for total of 1803 kCal, meeting 100% of patient's needs. Electrolytes in TPN: Na 30 mEq/L (= 47 mEq), K 30 mEq/L (= 47 mEq), Ca 054m/L since 7/30, Mg 11 mEq/L (= 17 mEq), Phos 30 mmol/L (= 47 mmol), maximum acetate Add standard MVI and trace elements to TPN Continue CVTS SSI Q4H and insulin in TPN 90 units. Watch mid-day lower CBGs and encourage po intake Monitor TPN labs on Mon/Thurs  and PRN.    Pema Thomure S. RoAlford HighlandPharmD, BCPS Clinical Staff Pharmacist [email or web address]  05/25/2021 7:10 AM

## 2021-05-25 NOTE — Progress Notes (Addendum)
      [address]       [place] [address]             [phone]       20 Days Post-Op Procedure(s) (LRB): THORACOTOMY MAJOR REPAIR PERFORATED ESOPHAGUS (Right)  Subjective: Patient denies nausea or abdominal pain this am.  Objective: Vital signs in last 24 hours: Temp:  [97.6 F (36.4 C)-98 F (36.7 C)] 97.9 F (36.6 C) (08/13 0800) Pulse Rate:  [90-96] 96 (08/13 0800) Cardiac Rhythm: Normal sinus rhythm (08/12 1908) Resp:  [18-22] 22 (08/13 0800) BP: (108-130)/(70-87) 127/72 (08/13 0800) SpO2:  [92 %-100 %] 92 % (08/13 0800) FiO2 (%):  [30 %] 30 % (08/13 0800) Weight:  [68.8 kg] 68.8 kg (08/13 0451)      Intake/Output from previous day: 08/12 0701 - 08/13 0700 In: 943.4 [P.O.:50; I.V.:893.4] Out: 1300 [Urine:1300]   Physical Exam:  Cardiovascular: RRR Pulmonary: Clear to auscultation bilaterally anteriorly Abdomen: Soft, non tender, bowel sounds present. Extremities:No lower extremity edema. Foot cool bilaterally Wounds: Clean and dry.  No erythema or signs of infection.   Lab Results: CBC: Recent Labs    05/23/21 0434 05/24/21 0500  WBC 11.0* 11.1*  HGB 9.6* 9.8*  HCT 29.3* 29.9*  PLT 357 377   BMET:  Recent Labs    05/24/21 0500 05/25/21 0213  NA 136 135  K 3.8 4.0  CL 106 106  CO2 22 20*  GLUCOSE 108* 115*  BUN 36* 39*  CREATININE 0.68 0.78  CALCIUM 8.6* 8.5*    PT/INR: No results for input(s): LABPROT, INR in the last 72 hours. ABG:  INR: Will add last result for INR, ABG once components are confirmed Will add last 4 CBG results once components are confirmed  Assessment/Plan:  1. CV - Post op NSTEMI. She had some a fib previously. SR, first degree heart block. On Lopressor 25 mg bid and Apixaban  2.  Pulmonary - Acute hypoxic respiratory failure. PE. On HFNC and now on 9 liters. Pulmonary/CCM following. 3. GI-She had modified barium swallow yesterday. Per speech pathology, Dysphagia 1 (puree) solids, patient  to be seated upright 90 degrees for meals, continue TPN 4. Deconditioned-continue PT 5. Anemia-Last H and H stable at 9.8 and 29.9  Donielle M ZimmermanPA-C 05/25/2021,8:27 AM [phone]   Agree with above. Continue pulmonary hygiene. Tolerating diet.  Keimani Laufer Bary Leriche

## 2021-05-25 NOTE — Progress Notes (Signed)
RN attempted to remove sutures. 2/3 removed. 3rd suture tied too tight and RN as well as NP from Pulmonology unable to remove. RN advised by NP steristrips were not indicated. Fuller Canada, RN

## 2021-05-25 NOTE — Progress Notes (Signed)
Patient sleeping and resting comfortably.  Rounded with primary RN.  No concerns voiced.  No orders required.  Appreciated nightly round.  Blood pressure 108/63, pulse 83, temperature 98 F (36.7 C), temperature source Oral, resp. rate 19, height '5\' 1"'$  (1.549 m), weight 68.8 kg, SpO2 100 %.   Lyndee Hensen, DO PGY-3, [facility] 05/25/2021

## 2021-05-25 NOTE — Progress Notes (Signed)
Family Medicine Teaching Service Daily Progress Note Intern Pager: [phone]  Patient name: Dana Coleman Medical record number: [id number] Date of birth: [birth year] Age: 80 y.o. Gender: female  Primary Care Provider: Gifford Shave, MD Consultants: Palliative medicine, CCM, CVTS, cardiology (s/o), general surgery (s/o), nephrology (s/o) Code Status: Partial: No CPR, no intubation  Pt Overview and Major Events to Date:  7/23: right pigtail catheter placed, admitted to ICU, intubated 7/24: CRRT, emergent right thoracotomy, repair of esophageal perforation with intercostal pedicled muscle flap and drainage of empyema and decortication of right lung 7/25: probable, inferior NSTEMI, no acute intervention. Echo with mildly reduced LV, large RV 7/26: extubated, double-lumen PICC placed, TPN started 8/1: Esophagram without leak, started on PO, heparin gtt stopped to increased bleeding from chest tubes. Declotted by CVTS. Transfused 1u pRBC 8/3: anterior chest tube removed 8/4: remaining chest tube removed 8/8: increased O2 requirement, Afib RVR -> PE with right heart strain 8/9: further increased O2 requirement, Afib   Abx timeline Zosyn: 7/23-7/26 Eraxis: 7/23-8/12 Unasyn: 7/26-7/31 Meropenem: 7/31-8/3 Ceftriaxone: 8/3-8/11 Metronidazole: 8/3-8/11   Micro Timeline 7/23: MRSA PCR negative 7/23: pleural fluid grew strep salivarius pan-sensitive, strep mitas (sensitive CTX), rare candida albicans 7/24: blood cultures negative x2  Assessment and Plan: Dana Coleman is a 80 year old female who was admitted on 7/23 for AMS and SOB, subsequently found to have acute renal failure, esophageal perforation now POD #20 from repair.  Admission was complicated by probable inferior NSTEMI, acute blood loss anemia requiring 1u pRBC, A. fib with RVR with consequent PE with right heart strain.  Past medical history significant for T1DM, CAD, HTN, HLD, MI (2004), Hurthle Cell Adenoma (2004),  cataracts, cervical cancer.  Esophageal perforation s/p repair  Empyema POD #20, patient is feeling improved and able to converse more today.  Still endorses no pain.  Had modified barium swallow yesterday and had diet switched to pured solids and thin liquids. -Antibiotic and antifungal course completed -Continue Tylenol 650 mg every 4 hours -Continue ramelteon '8mg'$  nightly  A. fib with RVR  PE with right heart strain  hypoxia On HFNC 6L 30%.  Patient did well with MBS yesterday and therefore was placed on.  Weight is continue to be monitored and she appears to have lost 1.6 kg since yesterday's check. -Lasix 40 mg IV twice -Strict I/Os -CVTS and CCM continue to follow, appreciate recommendations -RT continues to be involved respiratory status, appreciate continued efforts -Start Entresto 24-26 mg twice daily  FEN/GI: TPN; Dysphagia 1 (pure) solids and thin liquids as tolerated PPx: Eliquis 10 mg Dispo:SNF in 3 or more days. Barriers include clinical status.   Subjective:  Patient was able to converse more today than in previous days.  She still feeling short of breath but states she was able to sleep better overnight.  Still endorsing no pain.  Objective: Temp:  [97.6 F (36.4 C)-98 F (36.7 C)] 97.6 F (36.4 C) (08/13 0400) Pulse Rate:  [90-101] 90 (08/12 2314) Resp:  [18-22] 20 (08/13 0451) BP: (108-130)/(70-87) 118/72 (08/13 0400) SpO2:  [98 %-100 %] 98 % (08/13 0451) Weight:  [68.8 kg] 68.8 kg (08/13 0451) Physical Exam: General: Awake, alert, improved in conversation, no acute distress Cardiovascular: RRR, no murmurs auscultated Respiratory: Diminished lung sounds on the right, no wheezing or crackles Abdomen: Soft, nontender, normoactive bowel sounds Extremities: No pitting edema noted in all 4 extremities, 2+ radial and tibialis pulses  Laboratory: Recent Labs  Lab 05/22/21 0554 05/23/21 0434 05/24/21 0500  WBC 11.6* 11.0*  11.1*  HGB 9.9* 9.6* 9.8*  HCT  30.5* 29.3* 29.9*  PLT 382 357 377   Recent Labs  Lab 05/23/21 0434 05/24/21 0500 05/25/21 0213  NA 134* 136 135  K 3.5 3.8 4.0  CL 103 106 106  CO2 23 22 20*  BUN 34* 36* 39*  CREATININE 0.75 0.68 0.78  CALCIUM 8.3* 8.6* 8.5*  PROT 5.8* 5.9* 6.0*  BILITOT 0.4 0.4 0.4  ALKPHOS 65 62 59  ALT '30 27 26  '$ AST '28 28 28  '$ GLUCOSE 144* 108* 115*   CBG (last 3)  Recent Labs    05/24/21 2032 05/24/21 2315 05/25/21 0449  GLUCAP 175* 150* 107*    Imaging/Diagnostic Tests: No new imaging/studies at this time.  Wells Guiles, DO 05/25/2021, 7:47 AM PGY-1, [facility] Intern pager: [phone], text pages welcome

## 2021-05-25 NOTE — Progress Notes (Signed)
NAME:  Dana Coleman, MRN:  [id number], DOB:  1941-09-23, LOS: 21 ADMISSION DATE:  05/04/2021, CONSULTATION DATE:  7/23 REFERRING MD:  Ralene Bathe, CHIEF COMPLAINT:  Dyspnea   History of Present Illness:  80 y/o female admitted for empyema after a lap chole, noted to have esophageal perforation. Underwent repair, course complicated by non-STEMI and acute PE  Pertinent  Medical History  DM2 CAD Hypertension Hyperlipidemia Prior R breast cancer  Significant Hospital Events: Including procedures, antibiotic start and stop dates in addition to other pertinent events   7/23 admitted intubated. Pigtail by CCS in ED -- interestingly seems to have bilious outpt 7/24 Starting CRRT. Abundant GPCs in pleural fluid 7/24 1.  Right thoracotomy 2.  Repair of esophageal perforation with intercostal pedicled muscle flap. 3.  Drainage of empyema and decortication of right lung 7/25 early AM EKG changes, trop leak, probable inferiror NSTEMI, not candidate for intervention.  Echo with mildly reduced LV, large RV ?cause 7/26 extubated 7/29 up out of bed, rising WBC 7/30 blood culture >  7/31 WBC up again, cough with mucus production, chest pain 8/1 elevated WBC, added Vanco, MRSA swab pending, CT stripped from clot by [place]  8/2 pain control, OOB, passed swallow, started clears  8/3 mobility  8/8 increased O2 needs, PE on imaging, reconsulted.  CTA chest shows filling defects in right middle lobe right lower lobe and left upper lobe, RV/LV ratio 1.2 8/9 on 25 L / 50% high flow nasal cannula ,diuresed 8/9 echo showed EF 25%, cardiology consulted 8/10 Received 2 doses of Ativan and was confused ,Episodes of choking with pills, made n.p.o.  Interim History / Subjective:   No acute distress at rest Objective   Blood pressure 127/72, pulse 96, temperature 97.9 F (36.6 C), temperature source Oral, resp. rate (!) 22, height '5\' 1"'$  (1.549 m), weight 68.8 kg, SpO2 95 %.    FiO2 (%):  [30 %] 30 %    Intake/Output Summary (Last 24 hours) at 05/25/2021 1042 Last data filed at 05/25/2021 0900 Gross per 24 hour  Intake 1063.37 ml  Output 1300 ml  Net -236.63 ml   Filed Weights   05/23/21 0354 05/24/21 0500 05/25/21 0451  Weight: 70 kg 70.4 kg 68.8 kg    Examination: General: Elderly female in no acute distress HEENT: No JVD or lymphadenopathy is appreciated Neuro: Grossly intact without focal defect CV: Heart sounds are distant PULM: Diminished in bases right lateral chest with old chest tube sutures noted GI: soft, bsx4 active   Extremities: warm/dry, 1-2+ edema  Skin: no rashes or lesions Recent Labs  Lab 05/23/21 0434 05/24/21 0500 05/25/21 0213  NA 134* 136 135  K 3.5 3.8 4.0  CL 103 106 106  CO2 23 22 20*  BUN 34* 36* 39*  CREATININE 0.75 0.68 0.78  GLUCOSE 144* 108* 115*   Recent Labs  Lab 05/22/21 0554 05/23/21 0434 05/24/21 0500  HGB 9.9* 9.6* 9.8*  HCT 30.5* 29.3* 29.9*  WBC 11.6* 11.0* 11.1*  PLT 382 357 377     Resolved Hospital Problem list   Bleeding from chest tube, small hemothorax, clot and chest tube (improved)  AKI   Assessment & Plan:  Acute hypoxemic respiratory failure- secondary to volume overload, third spacing from poor nutrition w/ pleural effusions, interlobular septal thickening, new  acute PE.   Gen weakness and poor cough mechanics also contributing.  Consider changing to Lovenox Diuresis as tolerated Pulmonary toilet Extremity noninvasive testing limited   NSTEMI earlier  in stay Diuresis as tolerated Per cardiology     Reactive Afib/ RVR , currently in sinus rhythm Continue Metroprolol  Post op primary esophageal repair for primary esophageal rupture with polymicrobial empyema (05/05/21)  Continue anidulafungin -unclear duration If worsens start antibiotics and reculture Continue to monitor culture data Continue anidulafungin -unclear duration   Dysphagia - eso 8/1 did not show any leak Modified barium  swallow 05/24/2021 recommended pured diet  Profound muscular deconditioning and malnutrition- on TPN due to inability to keep up with nutritional demands Continue PT   Limited CODE STATUS, no CPR, no intubation , appreciate palliative care input Hopeful that hypoxia will improve gradually with diuresis and supportive care   Pulmonary critical care will see again on Monday  Richardson Landry Renlee Floor ACNP Acute Care Nurse Practitioner [place] Please consult Amion 05/25/2021, 10:42 AM

## 2021-05-26 DIAGNOSIS — R5381 Other malaise: Secondary | ICD-10-CM | POA: Diagnosis not present

## 2021-05-26 DIAGNOSIS — K223 Perforation of esophagus: Secondary | ICD-10-CM | POA: Diagnosis not present

## 2021-05-26 DIAGNOSIS — J810 Acute pulmonary edema: Secondary | ICD-10-CM | POA: Diagnosis not present

## 2021-05-26 DIAGNOSIS — I2699 Other pulmonary embolism without acute cor pulmonale: Secondary | ICD-10-CM | POA: Diagnosis not present

## 2021-05-26 LAB — GLUCOSE, CAPILLARY
Glucose-Capillary: 103 mg/dL — ABNORMAL HIGH (ref 70–99)
Glucose-Capillary: 106 mg/dL — ABNORMAL HIGH (ref 70–99)
Glucose-Capillary: 108 mg/dL — ABNORMAL HIGH (ref 70–99)
Glucose-Capillary: 123 mg/dL — ABNORMAL HIGH (ref 70–99)
Glucose-Capillary: 132 mg/dL — ABNORMAL HIGH (ref 70–99)
Glucose-Capillary: 133 mg/dL — ABNORMAL HIGH (ref 70–99)
Glucose-Capillary: 86 mg/dL (ref 70–99)

## 2021-05-26 LAB — BASIC METABOLIC PANEL
Anion gap: 9 (ref 5–15)
BUN: 35 mg/dL — ABNORMAL HIGH (ref 8–23)
CO2: 26 mmol/L (ref 22–32)
Calcium: 8.6 mg/dL — ABNORMAL LOW (ref 8.9–10.3)
Chloride: 102 mmol/L (ref 98–111)
Creatinine, Ser: 0.76 mg/dL (ref 0.44–1.00)
GFR, Estimated: >60 mL/min (ref >60)
Glucose, Bld: 104 mg/dL — ABNORMAL HIGH (ref 70–99)
Potassium: 3.9 mmol/L (ref 3.5–5.1)
Sodium: 137 mmol/L (ref 135–145)

## 2021-05-26 MED ORDER — TRACE MINERALS CU-MN-SE-ZN 300-55-60-3000 MCG/ML IV SOLN
INTRAVENOUS | Status: AC
  Filled 2021-05-26: qty 745.53

## 2021-05-26 MED ORDER — FUROSEMIDE 10 MG/ML IJ SOLN
80.0000 mg | Freq: Once | INTRAMUSCULAR | Status: AC
  Administered 2021-05-26: 80 mg via INTRAVENOUS
  Filled 2021-05-26: qty 8

## 2021-05-26 NOTE — Progress Notes (Signed)
Family Medicine Teaching Service Daily Progress Note Intern Pager: [phone]  Patient name: Dana Coleman Medical record number: [id number] Date of birth: [birth year] Age: 80 y.o. Gender: female  Primary Care Provider: Gifford Shave, MD Consultants: CVTS, Cardiology, Pulmonology Code Status: Partial Code   Pt Overview and Major Events to Date:  7/23: right pigtail catheter placed, admitted to ICU, intubated 7/24: CRRT, emergent right thoracotomy, repair of esophageal perforation with intercostal pedicled muscle flap and drainage of empyema and decortication of right lung 7/25: probable, inferior NSTEMI, no acute intervention. Echo with mildly reduced LV, large RV 7/26: extubated, double-lumen PICC placed, TPN started 8/1: Esophagram without leak, started on PO, heparin gtt stopped to increased bleeding from chest tubes. Declotted by CVTS. Transfused 1u pRBC 8/3: anterior chest tube removed 8/4: remaining chest tube removed 8/8: increased O2 requirement, Afib RVR -> PE with right heart strain 8/9: further increased O2 requirement, Afib 8/13: started Entresto, decreasing supplemental oxygen requirement   Abx timeline Zosyn: 7/23-7/26 Eraxis: 7/23-8/12 Unasyn: 7/26-7/31 Meropenem: 7/31-8/3 Ceftriaxone: 8/3-8/11 Metronidazole: 8/3-8/11   Micro Timeline 7/23: MRSA PCR negative 7/23: pleural fluid grew strep salivarius pan-sensitive, strep mitas (sensitive CTX), rare candida albicans 7/24: blood cultures negative x2  Assessment and Plan: Dana Coleman is a 80 y.o. female admitted for empyema followed by esophageal perforation now s/p repair of esophagus with new atrial fibrillation, s/p NSTEMI and new oxygen requirement in setting of new onsent HF and recent PE, overall improving with decreasing oxygen requirement.   Esophageal Repair for esophageal perforation, s/p empyema Patient POD #21, pain well managed with scheduled Tylenol. CVTS continues to follow. Chest tubes have  been removed along with sutures and partial removal of staples.  - continue to appreciate care for Dana Coleman from CVTS  - completed abx and antifungal courses  - tylenol scheduled for q 4 hours - decreasing oxygen requirement, currently saturating well on 4L   Acute Hypoxic Respiratory failure 2/2 to PE and new onset HF  Patient with decreasing oxygen requirement. Saturation 99% on 4L via [place]. Patient net +7 liters since admission. Diuresis has yielded nearly 2 liters our urine output in the last 24 hours after receiving '40mg'$  lasix for two doses yesterday. Weight 68.6kg from 68.8 on 8/13.  - lasix 40 once today - continue to monitor UOP  - monitor respiratory status  - continue entresto  - will dose '80mg'$  lasix IV today   Atrial Fibrillation  HR within normal range overnight, most recently 97 beats per minute. Telemetry shows patient sustained in normal sinus rhythm overnight, noted some missed beats and AV block.  - continue eliquis BID  - continue metoprolol '25mg'$  BID   Malnutrition, Protein calorie  Patient able to consume 10% of meal orally overnight, goal >50%, continues nutrition with TPN.  - feeding supplements  - continue TPN with goal to increase PO feeding  - continue dysphagia diet   T2DM  CBG ranged from 82 to 111 over last 24 hours.  - continue to monitor CBG, goal range 140-180  - avoid hypoglycemic episodes  - continue to encourage PO intake  - sliding scale insulin administered with TPN   FEN/GI: Dysphagia I PPx: eliquis per PE above   Dispo:SNF pending clinical improvement . Barriers include clinical status in setting of deconditioning .   Subjective:  Patient reports she feels that her breathing and ability to tolerate PO have been improving she denies pain at this time.   Objective: Temp:  [97.5 F (36.4 C)-98.6  F (37 C)] 97.5 F (36.4 C) (08/14 1138) Pulse Rate:  [78-97] 84 (08/14 1138) Resp:  [18-21] 20 (08/14 1138) BP: (103-113)/(59-67) 103/67  (08/14 1138) SpO2:  [95 %-100 %] 99 % (08/14 1138) Weight:  [68.6 kg] 68.6 kg (08/14 0441)  Physical Exam: General: female appearing stated age sitting upright in hospital bed with [place] in place, NAD  Cardiovascular: RRR  Respiratory: diminished breath sounds on right, no increased WOB, stable on 4L via [place]  Abdomen: soft, tenderness with deep palpation, minimal bowel sounds  Extremities: no LE edema, bilateral upper extremity edema appears to be improved/decreased from prior exam   Laboratory: Recent Labs  Lab 05/22/21 0554 05/23/21 0434 05/24/21 0500  WBC 11.6* 11.0* 11.1*  HGB 9.9* 9.6* 9.8*  HCT 30.5* 29.3* 29.9*  PLT 382 357 377   Recent Labs  Lab 05/23/21 0434 05/24/21 0500 05/25/21 0213 05/26/21 0055  NA 134* 136 135 137  K 3.5 3.8 4.0 3.9  CL 103 106 106 102  CO2 23 22 20* 26  BUN 34* 36* 39* 35*  CREATININE 0.75 0.68 0.78 0.76  CALCIUM 8.3* 8.6* 8.5* 8.6*  PROT 5.8* 5.9* 6.0*  --   BILITOT 0.4 0.4 0.4  --   ALKPHOS 65 62 59  --   ALT '30 27 26  '$ --   AST '28 28 28  '$ --   GLUCOSE 144* 108* 115* 104*    Imaging/Diagnostic Tests: No results found.   Dana Foster, MD 05/26/2021, 12:25 PM PGY-3, [facility] Intern pager: [phone], text pages welcome

## 2021-05-26 NOTE — Progress Notes (Addendum)
      [address]       [employer] [address]             [phone]       21 Days Post-Op Procedure(s) (LRB): THORACOTOMY MAJOR REPAIR PERFORATED ESOPHAGUS (Right)  Subjective: Patient awake, alert this am. She denies nausea or abdominal pain this am.  Objective: Vital signs in last 24 hours: Temp:  [97.8 F (36.6 C)-98.6 F (37 C)] 98.6 F (37 C) (08/14 0339) Pulse Rate:  [83-96] 88 (08/14 0339) Cardiac Rhythm: Heart block;Bundle branch block (08/13 1900) Resp:  [17-22] 20 (08/14 0339) BP: (99-127)/(61-72) 113/61 (08/14 0339) SpO2:  [92 %-100 %] 100 % (08/14 0339) FiO2 (%):  [30 %] 30 % (08/13 0800) Weight:  [68.6 kg] 68.6 kg (08/14 0441)      Intake/Output from previous day: 08/13 0701 - 08/14 0700 In: 880.5 [P.O.:220; I.V.:660.5] Out: 2850 [Urine:2850]   Physical Exam:  Cardiovascular: RRR Pulmonary: Clear to auscultation bilaterally anteriorly Abdomen: Soft, non tender, bowel sounds present. Extremities:No lower extremity edema. Foot cool bilaterally Wounds: Clean and dry.  No erythema or signs of infection. One silk suture remains (at previous chest tube wound)   Lab Results: CBC: Recent Labs    05/24/21 0500  WBC 11.1*  HGB 9.8*  HCT 29.9*  PLT 377    BMET:  Recent Labs    05/25/21 0213 05/26/21 0055  NA 135 137  K 4.0 3.9  CL 106 102  CO2 20* 26  GLUCOSE 115* 104*  BUN 39* 35*  CREATININE 0.78 0.76  CALCIUM 8.5* 8.6*     PT/INR: No results for input(s): LABPROT, INR in the last 72 hours. ABG:  INR: Will add last result for INR, ABG once components are confirmed Will add last 4 CBG results once components are confirmed  Assessment/Plan:  1. CV - Post op NSTEMI. She had some a fib previously. SR, first degree heart block. On Lopressor 25 mg bid and Apixaban  2.  Pulmonary - Acute hypoxic respiratory failure. S/p acute PE. She has been on HFNC and now down to 4 liters.Pulmonary/CCM following. 3. GI-She had  modified barium swallow Friday. Per speech pathology, Dysphagia 1 (puree) solids, patient to be seated upright 90 degrees for meals, continue TPN 4. Deconditioned-continue PT 5. Anemia-Last H and H stable at 9.8 and 29.9 6. CBGs 111/106/103. On TFs. 7. There was one silk suture not able to be removed yesterday. I was able to remove today. Chest tube wound superficially open but no active drainage. 2x2 and tape applied.  Sharalyn Ink ZimmermanPA-C 05/26/2021,7:43 AM [id number]    Chart reviewed, patient examined, agree with above. She remains afebrile. I reviewed her recent CTA. She has residual pleural changes in the right chest due to her surgery and empyema on presentation, followed by some bleeding several days postop once heparin started for atrial fib. This should resolve without further intervention.

## 2021-05-26 NOTE — Progress Notes (Signed)
PHARMACY - TOTAL PARENTERAL NUTRITION CONSULT NOTE  Indication: s/p esophageal perforation and repair  Patient Measurements: Height: '5\' 1"'$  (154.9 cm) Weight: 68.6 kg (151 lb 3.8 oz) IBW/kg (Calculated) : 47.8   Body mass index is 28.58 kg/m.  Assessment:  80 y/o female with recent lap chole for symptomatic cholelithiasis on 7/21. PMH including HTN, HLD, DM, R breast cancer, and CAD. Patient presented on 7/23 with AMS and unresponsiveness - was complaining of abdominal discomfort and then patient became unresponsive and slumped on table.  S/p gall bladder surgery and esophageal perforation-repaired 7/24.  Pharmacy consulted for TPN management.  Glucose / Insulin: DM with A1c 7.4% on Lantus 24/d PTA.  CBGs 82-111 running lower last 24h (previous only running lower mid-day when TPN was off)  Utilized 1 units SSI + 90 units insulin in TPN--will decrease today.  Electrolytes: Lytes WNL. CoCa 10.18.  Renal: septic ATN s/p CRRT (end 7/25) - SCr < 1 and stable, BUN at 35 Hepatic: LFTs significantly increased 8/4 (decreased with holding TPN for 6 hours and Lasix), tbili WNL, albumin down 1.9, prealbumin up 8.9, TG WNL Intake / Output; MIVF:  UOP 2850 (Lasix x2 on 8/13), I/O +7.6 (dropping), LBM 8/12 - 10% meal intake: 270m documented + Ensure x 2 charted on 8/13  GI Imaging: - 7/23 CT: no acute abnormalities  GI Surgeries / Procedures:  - 7/21: s/p laparoscopic cholecystectomy and LOA - 7/24: s/p R thoracotomy, repair of esophageal perf with muscle flap, drainage of empyema and decortication of R lung  Central access: PICC 7/26 (double lumen) TPN start date: 05/07/21  Nutritional Goals (per RD rec on 8/9): kCal: 1750-2030 , Protein: 90-120 , Fluid: >=1.8L  Current Nutrition:  TPN Ensure Enlive BID 8/12 Dysphagia 1 diet No feeding tube recommended due to esophageal injury   Plan:  Con't TPN until po intake improves Continue concentrated cyclic TPN over 16 hr at 1575 mL over 16  hrs: 53 mL/hr x 1 hr, then 105 mL/hr x 14 hrs, then 53 mL/hr x 1 hr. TPN provides 111g AA, 230g CHO, and 58g ILE for total of 1803 kCal, meeting 100% of patient's needs. Electrolytes in TPN: Na 30 mEq/L (= 47 mEq), K 30 mEq/L (= 47 mEq), Ca 082m/L since 7/30, Mg 11 mEq/L (= 17 mEq), Phos 30 mmol/L (= 47 mmol), maximum acetate Add standard MVI and trace elements to TPN Continue CVTS SSI Q4H and decrease insulin in TPN 85 units. Watch mid-day lower CBGs and encourage po intake Monitor TPN labs on Mon/Thurs  and PRN.  Diuresing for HF/fluid overload   Dana Coleman S. Dana HighlandPharmD, BCPS Clinical Staff Pharmacist [email or web address]  05/26/2021 8:14 AM

## 2021-05-27 ENCOUNTER — Encounter: Payer: Medicare Other | Admitting: Surgery

## 2021-05-27 DIAGNOSIS — J9601 Acute respiratory failure with hypoxia: Secondary | ICD-10-CM | POA: Diagnosis not present

## 2021-05-27 DIAGNOSIS — I2699 Other pulmonary embolism without acute cor pulmonale: Secondary | ICD-10-CM | POA: Diagnosis not present

## 2021-05-27 DIAGNOSIS — J810 Acute pulmonary edema: Secondary | ICD-10-CM | POA: Diagnosis not present

## 2021-05-27 DIAGNOSIS — Z789 Other specified health status: Secondary | ICD-10-CM

## 2021-05-27 DIAGNOSIS — K223 Perforation of esophagus: Secondary | ICD-10-CM | POA: Diagnosis not present

## 2021-05-27 LAB — PHOSPHORUS: Phosphorus: 4.3 mg/dL (ref 2.5–4.6)

## 2021-05-27 LAB — CBC
HCT: 31.4 % — ABNORMAL LOW (ref 36.0–46.0)
Hemoglobin: 10.1 g/dL — ABNORMAL LOW (ref 12.0–15.0)
MCH: 30.2 pg (ref 26.0–34.0)
MCHC: 32.2 g/dL (ref 30.0–36.0)
MCV: 94 fL (ref 80.0–100.0)
Platelets: 323 10*3/uL (ref 150–400)
RBC: 3.34 MIL/uL — ABNORMAL LOW (ref 3.87–5.11)
RDW: 19.5 % — ABNORMAL HIGH (ref 11.5–15.5)
WBC: 9.3 10*3/uL (ref 4.0–10.5)
nRBC: 0 % (ref 0.0–0.2)

## 2021-05-27 LAB — BASIC METABOLIC PANEL
Anion gap: 6 (ref 5–15)
BUN: 35 mg/dL — ABNORMAL HIGH (ref 8–23)
CO2: 28 mmol/L (ref 22–32)
Calcium: 8.3 mg/dL — ABNORMAL LOW (ref 8.9–10.3)
Chloride: 102 mmol/L (ref 98–111)
Creatinine, Ser: 0.64 mg/dL (ref 0.44–1.00)
GFR, Estimated: >60 mL/min (ref >60)
Glucose, Bld: 68 mg/dL — ABNORMAL LOW (ref 70–99)
Potassium: 3.9 mmol/L (ref 3.5–5.1)
Sodium: 136 mmol/L (ref 135–145)

## 2021-05-27 LAB — GLUCOSE, CAPILLARY
Glucose-Capillary: 67 mg/dL — ABNORMAL LOW (ref 70–99)
Glucose-Capillary: 86 mg/dL (ref 70–99)
Glucose-Capillary: 106 mg/dL — ABNORMAL HIGH (ref 70–99)
Glucose-Capillary: 88 mg/dL (ref 70–99)
Glucose-Capillary: 114 mg/dL — ABNORMAL HIGH (ref 70–99)
Glucose-Capillary: 168 mg/dL — ABNORMAL HIGH (ref 70–99)

## 2021-05-27 LAB — MAGNESIUM: Magnesium: 2.3 mg/dL (ref 1.7–2.4)

## 2021-05-27 LAB — PREALBUMIN: Prealbumin: 16 mg/dL — ABNORMAL LOW (ref 18–38)

## 2021-05-27 LAB — TRIGLYCERIDES: Triglycerides: 97 mg/dL (ref <150)

## 2021-05-27 MED ORDER — TRACE MINERALS CU-MN-SE-ZN 300-55-60-3000 MCG/ML IV SOLN
INTRAVENOUS | Status: AC
  Filled 2021-05-27: qty 745.53

## 2021-05-27 MED ORDER — ACETAMINOPHEN 325 MG PO TABS
650.0000 mg | ORAL_TABLET | ORAL | Status: DC | PRN
  Administered 2021-05-28 – 2021-06-04 (×9): 650 mg via ORAL
  Filled 2021-05-27 (×9): qty 2

## 2021-05-27 MED ORDER — RAMELTEON 8 MG PO TABS
4.0000 mg | ORAL_TABLET | Freq: Every evening | ORAL | Status: DC | PRN
  Filled 2021-05-27: qty 1

## 2021-05-27 MED ORDER — PANTOPRAZOLE SODIUM 40 MG PO TBEC
40.0000 mg | DELAYED_RELEASE_TABLET | Freq: Every day | ORAL | Status: DC
  Administered 2021-05-28 – 2021-06-04 (×7): 40 mg via ORAL
  Filled 2021-05-27 (×9): qty 1

## 2021-05-27 MED ORDER — INSULIN ASPART 100 UNIT/ML IJ SOLN
0.0000 [IU] | INTRAMUSCULAR | Status: DC
Start: 2021-05-27 — End: 2021-06-03
  Administered 2021-05-27 – 2021-05-30 (×6): 1 [IU] via SUBCUTANEOUS

## 2021-05-27 NOTE — Progress Notes (Signed)
Mobility Specialist: Progress Note   05/27/21 1422  Mobility  Activity Transferred to/from [facility];Transferred:  Chair to bed  Level of Assistance Minimal assist, patient does 75% or more  Assistive Device Front wheel walker  Distance Ambulated (ft) 6 ft  Mobility Ambulated with assistance in room  Mobility Response Tolerated fair  Mobility performed by Mobility specialist;Nurse  $Mobility charge 1 Mobility   Pre-Mobility: 91 HR, 99% SpO2 Post-Mobility: 81 HR  Pt on 2 L/min [place] upon entering room. Pt requesting to go back to the bed. Upon standing pt bowel incontinent, RN assisted with pericare. Pt to [facility] d/t bowel incontinence again after standing and assisted with pericare. Pt back to bed with call bell and phone at her side. Bed alarm is on.   [facility] Dana Coleman Mobility Specialist Mobility Specialist Phone: [phone]

## 2021-05-27 NOTE — Progress Notes (Addendum)
Family Medicine Teaching Service Daily Progress Note Intern Pager: [phone]  Patient name: Dana Coleman Medical record number: [id number] Date of birth: 1941-04-25 Age: 80 y.o. Gender: female  Primary Care Provider: Gifford Shave, MD Consultants: CVTS, Pulmonology, Cardiology Code Status: Partial code  Pt Overview and Major Events to Date:  7/23 admitted to ICU, intubated, right pigtail catheter placed 7/24- CRRT, emergent right thoracotomy, repair of esophageal perforation with intercostal pedicled muscle flap and drainage of empyema and decortication of right lung 7/25- NSTEMI, no intervention 7/26- extubated, TPN started 8/1- esophagram without leak, PO started, heparin gtt d/c because of increased bleeding from chest tubes. Transfused 1u pRBC 8/3: anterior chest tube removed 8/4: remaining chest tube removed 8/8: increased O2 requirement, Afib RVR -> PE with right heart strain 8/9: further increased O2 requirement, Afib 8/13: started Entresto, decreasing supplemental oxygen requirement  Abx timeline Zosyn: 7/23-7/26 Eraxis: 7/23-8/12 Unasyn: 7/26-7/31 Meropenem: 7/31-8/3 Ceftriaxone: 8/3-8/11 Metronidazole: 8/3-8/11   Micro Timeline 7/23: MRSA PCR negative 7/23: pleural fluid grew strep salivarius pan-sensitive, strep mitas (sensitive CTX), rare candida albicans 7/24: blood cultures negative x2  Assessment and Plan: Dana Coleman is a 80 y.o. female admitted for empyema followed by esophageal perforation now s/p repair of esophagus with new atrial fibrillation, s/p NSTEMI and new oxygen requirement in setting of new onsent HF and recent PE, overall improving with decreasing oxygen requirement.   Esophageal Repair for esophageal perforation, s/p empyema Patient POD #22. Pain well-controlled with scheduled Tylenol. CVTS completely removed stapes on right side chest. Cardiothoracic surgery was in the room prior to my exam. - CVTS following, greatly appreciate  their care and reccommendations - tylenol scheduled q4h - d/c Fentanyl PRN as pain is well-controlled - completed abx and antifungal TX - decreasing oxygen requirement, currently saturating well on 4L   Acute Hypoxic Respiratory failure 2/2 to PE and new onset HF  Continues to improve. Oxygen requirement decreasing, on 3L [place] with saturations > 95%. Urine output 1200 in last 24 hours with diuresis. Net ( 8 L since admission). Diuresed yesterday with 2L urine output s/p '40mg'$  Lasix. Does not appear volume overloaded in any capacity with no JVD/ lower extremity edema/crackles. She has been able to move from bed to chair with PT, but otherwise has not been ambulating. - monitor respiratory status - monitor urine output - continue lasix 40 - continue entresto - PT/OT- goal this week is to work on gait   Atrial Fibrillation  Stable. Hr 80s bpm overnight.  - continue metoprolol BID - continue eliquis BID    Malnutrition, Protein calorie  Not improving. Goal is > 50% PO. PO intake not well documented for past 24 hours. Patient only ate a small amount of her dinner last night. Per pharmacy, contiue TPN until PO intake improves and decrease insulin in TPN to 85 units - continue to encourage increased PO feeding - monitor CBG, particularly in the mid-day as these glucose #s have been lower - calorie count to have objective data on PO intake - continue dysphagia diet - continue TPN - Ensure alive BID   T2DM  CBG ranges over past 24 hours: 80s-130s. TPN insulin decreased to 85 units, per pharmacy. Had an episode of hypoglycemia to 67 on 8/15 at 0400 - follow CBGs, goal 140-180 - monitor for hypoglycemia   - change Novolog coverage from 0-24 to very sensitive 0-9  FEN/GI: Dysphagia 1 PPx: Eliquis (h/o PE) Dispo:SNF pending clinical improvement . Barriers include clinical status in setting of  deconditioning.   Subjective:  Dana Coleman tells me she has no complaints this morning. She denies  difficulty breathing, chest pain, abdominal pain. She says that she ate dinner last night of [place], green beans.  Objective: Temp:  [97.5 F (36.4 C)-98.6 F (37 C)] 98 F (36.7 C) (08/14 2026) Pulse Rate:  [78-97] 80 (08/14 2026) Resp:  [20-26] 26 (08/14 2026) BP: (102-113)/(59-67) 111/59 (08/14 2026) SpO2:  [98 %-100 %] 99 % (08/14 2026) Weight:  [68.6 kg] 68.6 kg (08/14 0441) Physical Exam: General: well-appearing, sitting upright in bed with [place] in place, no acute distress Cardiovascular: RRR Respiratory: diminished breath sounds on right side, seems slightly short of breath but significantly improving. No respiratory distress. Stable on 3L via  Abdomen: Soft, non-tender and non-distended. Extremities: BL lower extremities without edema, warm, dry well-perfused  Laboratory: Recent Labs  Lab 05/22/21 0554 05/23/21 0434 05/24/21 0500  WBC 11.6* 11.0* 11.1*  HGB 9.9* 9.6* 9.8*  HCT 30.5* 29.3* 29.9*  PLT 382 357 377   Recent Labs  Lab 05/23/21 0434 05/24/21 0500 05/25/21 0213 05/26/21 0055  NA 134* 136 135 137  K 3.5 3.8 4.0 3.9  CL 103 106 106 102  CO2 23 22 20* 26  BUN 34* 36* 39* 35*  CREATININE 0.75 0.68 0.78 0.76  CALCIUM 8.3* 8.6* 8.5* 8.6*  PROT 5.8* 5.9* 6.0*  --   BILITOT 0.4 0.4 0.4  --   ALKPHOS 65 62 59  --   ALT '30 27 26  '$ --   AST '28 28 28  '$ --   GLUCOSE 144* 108* 115* 104*    Imaging/Diagnostic Tests: No results found.   Orvis Brill, DO 05/27/2021, 12:04 AM PGY-1, [place] Intern pager: [phone], text pages welcome

## 2021-05-27 NOTE — Care Management Important Message (Signed)
Important Message  Patient Details  Name: Dana Coleman MRN: [id number] Date of Birth: [birth year]   Medicare Important Message Given:  Yes     Shelda Altes 05/27/2021, 9:37 AM

## 2021-05-27 NOTE — Progress Notes (Signed)
PHARMACY - TOTAL PARENTERAL NUTRITION CONSULT NOTE  Indication: s/p esophageal perforation and repair  Patient Measurements: Height: '5\' 1"'$  (154.9 cm) Weight: 69.4 kg (153 lb) IBW/kg (Calculated) : 47.8   Body mass index is 28.91 kg/m.  Assessment:  80 y/o female with recent lap chole for symptomatic cholelithiasis on 7/21. PMH including HTN, HLD, DM, R breast cancer, and CAD. Patient presented on 7/23 with AMS and unresponsiveness - was complaining of abdominal discomfort and then patient became unresponsive and slumped on table.  S/p gall bladder surgery and esophageal perforation-repaired 7/24.  Pharmacy consulted for TPN management.  Glucose / Insulin: DM with A1c 7.4% on Lantus 24/d PTA.  CBGs 67-133, (previous only running lower mid-day when TPN was off)  Utilized 2 units SSI + 85 units insulin in TPN - decreased 8/14  Electrolytes: Lytes WNL. CoCa 10.18.  Renal: septic ATN s/p CRRT (end 7/25) - SCr < 1 and stable, BUN at 35 Hepatic: LFTs significantly increased 8/4 (decreased with holding TPN for 6 hours and Lasix), tbili WNL, albumin down 1.9, prealbumin up 8.9, TG WNL Intake / Output; MIVF:  UOP 0.7 ml/kg/hr, LBM 8/12 - 15% meal intake: 230m documented + Ensure x 2   GI Imaging: - 7/23 CT: no acute abnormalities  GI Surgeries / Procedures:  - 7/21: s/p laparoscopic cholecystectomy and LOA - 7/24: s/p R thoracotomy, repair of esophageal perf with muscle flap, drainage of empyema and decortication of R lung  Central access: PICC 7/26 (double lumen) TPN start date: 05/07/21  Nutritional Goals (per RD rec on 8/9): kCal: 1750-2030 , Protein: 90-120 , Fluid: >=1.8L  Current Nutrition:  TPN Ensure Enlive BID 8/12 Dysphagia 1 diet, 15% intake No feeding tube recommended due to esophageal injury   Plan:  Continue concentrated cyclic TPN over 16 hr at 1575 mL over 16 hrs: 53 mL/hr x 1 hr, then 105 mL/hr x 14 hrs, then 53 mL/hr x 1 hr. TPN provides 111g AA, 230g CHO, and  58g ILE for total of 1803 kCal, meeting 100% of patient's needs. Electrolytes in TPN: Na 30 mEq/L (= 47 mEq), K 30 mEq/L (= 47 mEq), Ca 063m/L since 7/30, Mg 11 mEq/L (= 17 mEq), Phos 30 mmol/L (= 47 mmol), maximum acetate Add standard MVI and trace elements to TPN Continue CVTS SSI Q4H and decrease insulin in TPN to 75 units. Watch mid-day lower CBGs and encourage po intake BMP in AM  JoBertis RuddyPharmD Clinical Pharmacist Please check AMION for all [facility] 05/27/2021 8:11 AM

## 2021-05-27 NOTE — Progress Notes (Signed)
FPTS Interim Night Progress Note  S:Patient sleeping comfortably.  Rounded with primary night RN.  Continues to do well on 4L HFNC.  No concerns voiced.  No orders required.    O: Today's Vitals   05/26/21 1138 05/26/21 1643 05/26/21 2026 05/26/21 2342  BP: 103/67 102/60 (!) 111/59 (!) 105/57  Pulse: 84  80 81  Resp: 20 (!) 21 (!) 26 14  Temp: (!) 97.5 F (36.4 C) 97.6 F (36.4 C) 98 F (36.7 C) 98 F (36.7 C)  TempSrc: Oral Oral Oral Oral  SpO2: 99% 100% 99% 96%  Weight:      Height:      PainSc:          A/P: Switched Protonix to po Decrease Remeron to '4mg'$  qhs prn Would consider decreasing Tylenol to q6h prn Monitor respiratory status OOB daily Continue current management  Carollee Leitz MD PGY-3, [place] Medicine Service pager [phone]

## 2021-05-27 NOTE — Progress Notes (Signed)
Inpatient Rehabilitation Admissions Coordinator  I met with patient at bedside with her brother. I discussed the goals of a Cir admit and that patient currently not at a level to tolerate the intensity required of an inpt rehab admit. Patient also feels she can not tolerate that level. I disused the need for SNF level rehab. I contacted Dr Deatra Canter by phone and I will alert acute team and TOC of need for SNF. We will sign off at this time.  Danne Baxter, RN, MSN Rehab Admissions Coordinator [phone] 05/27/2021 2:55 PM

## 2021-05-27 NOTE — Progress Notes (Signed)
NAME:  Dana Coleman, MRN:  [id number], DOB:  1941/04/29, LOS: 23 ADMISSION DATE:  05/04/2021, CONSULTATION DATE:  7/23 REFERRING MD:  Ralene Bathe, CHIEF COMPLAINT:  Dyspnea   History of Present Illness:  80 y/o female admitted for empyema after a lap chole, noted to have esophageal perforation. Underwent repair, course complicated by non-STEMI and acute PE  Pertinent  Medical History  DM2 CAD Hypertension Hyperlipidemia Prior R breast cancer  Significant Hospital Events: Including procedures, antibiotic start and stop dates in addition to other pertinent events   7/23 admitted intubated. Pigtail by CCS in ED -- interestingly seems to have bilious outpt 7/24 Starting CRRT. Abundant GPCs in pleural fluid 7/24 1.  Right thoracotomy 2.  Repair of esophageal perforation with intercostal pedicled muscle flap. 3.  Drainage of empyema and decortication of right lung 7/25 early AM EKG changes, trop leak, probable inferiror NSTEMI, not candidate for intervention.  Echo with mildly reduced LV, large RV ?cause 7/26 extubated 7/29 up out of bed, rising WBC 7/30 blood culture >  7/31 WBC up again, cough with mucus production, chest pain 8/1 elevated WBC, added Vanco, MRSA swab pending, CT stripped from clot by [place]  8/2 pain control, OOB, passed swallow, started clears  8/3 mobility  8/8 increased O2 needs, PE on imaging, reconsulted.  CTA chest shows filling defects in right middle lobe right lower lobe and left upper lobe, RV/LV ratio 1.2 8/9 on 25 L / 50% high flow nasal cannula ,diuresed 8/9 echo showed EF 25%, cardiology consulted 8/10 Received 2 doses of Ativan and was confused ,Episodes of choking with pills, made n.p.o.  Interim History / Subjective:   No acute events overnight. Patient without complaints at this time.   Objective   Blood pressure 105/61, pulse 89, temperature 98.7 F (37.1 C), temperature source Oral, resp. rate (!) 21, height '5\' 1"'$  (1.549 m), weight 69.4 kg, SpO2  100 %.        Intake/Output Summary (Last 24 hours) at 05/27/2021 1620 Last data filed at 05/27/2021 0426 Gross per 24 hour  Intake 1154.86 ml  Output 1200 ml  Net -45.14 ml   Filed Weights   05/25/21 0451 05/26/21 0441 05/27/21 0626  Weight: 68.8 kg 68.6 kg 69.4 kg    Examination: General: Elderly woman in no acute distress HEENT: No JVD or lymphadenopathy is appreciated, moist mucous membranes, sclera anicteric Neuro: Grossly intact without focal defect CV: RRR, no murmurs PULM: diminished breath sounds. No wheezing or rhonchi. GI: soft, bsx4 active  Extremities: warm/dry, no edema Skin: no rashes or lesions  Resolved Hospital Problem list   Bleeding from chest tube, small hemothorax, clot and chest tube (improved)  AKI   Assessment & Plan:  Acute hypoxemic respiratory failure  Pulmonary Emboli Empysema - Candida Glabrata Ischemic Cardiomyopathy Dysphagia  Her respiratory failure continues to improve with the reduction in her supplemental oxygen requirements with diuresis, treatment of the empyema and anticoagulation for the PE.  - She has transitioned to eliquis for the PE - Anidulofungin stopped 8/13 for completed course of empyema treatment - Continue dysphagia modified diet. Remains on TPN for nutritional needs. - Continue incentive spirometry use. Patient remains significantly deconditioned. - PT/OT/speech therapy following - Continue to wean oxygen as tolerated to maintain goal SpO2 92% or higher.  No further recommendations to patient's care plan at this time. PCCM will sign off. Please call us with any questions.  Freda Jackson, MD [facility] Office: [phone]  See Amion for personal pager PCCM on call pager [phone] until 7pm. Please call Elink 7p-7a. [phone]

## 2021-05-27 NOTE — Progress Notes (Signed)
Mobility Specialist: Progress Note   05/27/21 1231  Mobility  Activity Transferred:  Bed to chair  Level of Assistance Contact guard assist, steadying assist  Assistive Device Front wheel walker  Distance Ambulated (ft) 4 ft  Mobility Out of bed to chair with meals  Mobility Response Tolerated well  Mobility performed by Mobility specialist;Other (comment) (PTA Carly)  Bed Position Chair  $Mobility charge 1 Mobility   Pre-Mobility: 85 HR, 123/73 BP, 97% SpO2 Post-Mobility: 91 HR, 131/69 BP, 99% SpO2  Pt on 2 L/min [name] upon entering room. Pt c/o feeling dizzy upon sitting EOB from supine with min guard, VSS. Pt able to transfer to the recliner but declined further mobility d/t fatigue. Pt has call bell and phone at her side. Chair alarm is on.   [facility] Dana Coleman Mobility Specialist Mobility Specialist Phone: [phone]

## 2021-05-27 NOTE — Progress Notes (Signed)
  Speech Language Pathology Treatment: Dysphagia  Patient Details Name: Dana Coleman MRN: [id number] DOB: 1941-02-24 Today's Date: 05/27/2021 Time: [id number] SLP Time Calculation (min) (ACUTE ONLY): 13 min  Assessment / Plan / Recommendation Clinical Impression  Pt has had limited PO intake today per RN, and when asked about this, pt says she just hasn't felt like eating. She was agreeable to trials with SLP, taking a mildly increased length of time for oral transit with purees. At first she did not want to try anything more solid than a puree, but with encouragement, she did accept two small pieces of peach. The first one she declined placement of her dentures and ended up expectorating it when she said she could not chew it. Again with encouragement she tried the second piece after placing her upper and lower dentures, but this still took a long time to chew. Oral residue was left behind in her mouth, and after working on this single piece of diced peach for a while, she was able to clear her mouth but she also said she was too worn out to try anything else. Discussed current diet with her - she would prefer to stay on purees because she does not think she has the energy to try even finely chopped foods. Will continue to follow and advance as able.    HPI HPI: Pt is a 80 yo female admitted on 05/04/21 for AMS. HX of cholecystectomy on 7/21.  Intubated upon admission, 7/24 Started CRRT, Esophageal perforation with septic shock post flap repair, drainage of empyema and decortication of R lung with chest tube placed.  7/25 EKG changes and probable nstemi. Extubated 7/26. Esophagram 8/1 cleared to start PO diet. CT removed 8/4. Pt with concern for aspiration 8/8 and made NPO. Pt also with new PE with R heart strain and increased O2 HFNC requirements 8/8. PMH includes: R breast CA, DM, CAD, HTN.      SLP Plan  Continue with current plan of care       Recommendations  Diet recommendations:  Dysphagia 1 (puree);Thin liquid Liquids provided via: Cup;Straw Medication Administration: Crushed with puree Supervision: Staff to assist with self feeding Compensations: Slow rate;Small sips/bites Postural Changes and/or Swallow Maneuvers: Seated upright 90 degrees;Upright 30-60 min after meal                Oral Care Recommendations: Oral care QID Follow up Recommendations: Inpatient Rehab SLP Visit Diagnosis: Dysphagia, oral phase (R13.11) Plan: Continue with current plan of care       GO                Osie Bond., M.A. [place] Acute Rehabilitation Services Pager [phone] Office [phone]  05/27/2021, 11:51 AM

## 2021-05-27 NOTE — Progress Notes (Signed)
Physical Therapy Treatment Patient Details Name: Dana Coleman MRN: [id number] DOB: [birth year] Today's Date: 05/27/2021    History of Present Illness Pt is a 80 yo female admitted on 05/04/21 for AMS. HX of cholecystectomy on 7/21.  Intubated upon admission, 7/24 Started CRRT, Esophageal perforation with septic shock post flap repair, drainage of empyema and decortication of R lung with chest tube placed.  7/25 EKG changes and probable nstemi. Extubated 7/26. CT removed 8/4. Pt with new PE with R heart strain and increased O2 HFNC requirements 8/8. PMH includes: R breast CA, DM, CAD, HTN.    PT Comments    Pt received in supine, agreeable to therapy session with moderate encouragement and with fair participation and tolerance for transfer training and short gait task at bedside with RW. Pt performed functional mobility tasks with up to minA (second person present for line mgmt but not needing to physically assist) and quick to fatigue. Encouraged supine/seated exercises but pt with minimal participation in therapeutic exercises due to fatigue. Pt continues to benefit from PT services to progress toward functional mobility goals.  Discussed with supervising PT Bunnie Philips, pt discharge rec updated per decreased endurance to low intensity post-acute rehab.  Follow Up Recommendations  SNF     Equipment Recommendations  Rolling walker with 5" wheels;Wheelchair (measurements PT);Wheelchair cushion (measurements PT)    Recommendations for Other Services       Precautions / Restrictions Precautions Precautions: Fall Precaution Comments: watch respiratory status Restrictions Weight Bearing Restrictions: No    Mobility  Bed Mobility Overal bed mobility: Needs Assistance Bed Mobility: Rolling;Sidelying to Sit Rolling: Min guard Sidelying to sit: Min assist            Transfers Overall transfer level: Needs assistance Equipment used: Rolling walker (2 wheeled) Transfers: Sit to/from  [facility] Sit to Stand: Min guard;+2 safety/equipment Stand pivot transfers: Min assist;+2 safety/equipment       General transfer comment: Pt not need lift assist to stand but minA for steadying/RW assist during pivotal steps/lateral steps toward chair. Pt with increased WOB (VSS on 2L [place]) and deferring longer gait trial due to fatigue.  Ambulation/Gait Ambulation/Gait assistance: Min assist;+2 safety/equipment Gait Distance (Feet): 6 Feet Assistive device: Rolling walker (2 wheeled) Gait Pattern/deviations: Wide base of support;Trunk flexed;Decreased stride length;Decreased step length - right;Decreased step length - left   Gait velocity interpretation: <1.8 ft/sec, indicate of risk for recurrent falls General Gait Details: from EOB to chair 31f away. Pt with effortful breathing (RR and SpO2 WNL per monitor) and impulsive to sit once reaching chair, pt c/o fatigue and unwilling to attempt additional gait trials       Balance Overall balance assessment: Needs assistance Sitting-balance support: No upper extremity supported;Feet supported Sitting balance-Leahy Scale: Fair Sitting balance - Comments: min guard   Standing balance support: Bilateral upper extremity supported Standing balance-Leahy Scale: Poor Standing balance comment: walker and min assist for dynamic standing       Cognition Arousal/Alertness: Awake/alert Behavior During Therapy: WFL for tasks assessed/performed;Flat affect Overall Cognitive Status: Impaired/Different from baseline Area of Impairment: Problem solving;Following commands;Memory                     Memory: Decreased short-term memory Following Commands: Follows one step commands consistently;Follows one step commands with increased time   Awareness: Emergent Problem Solving: Slow processing;Requires verbal cues;Decreased initiation General Comments: pt with slow processing, requires max encouragement this date, somewhat  self-limiting.  Exercises General Exercises - Lower Extremity Ankle Circles/Pumps: AROM;Both;Supine;10 reps Heel Slides: 5 reps;AAROM;Both;Supine (needs more assist on L than on R) Hip ABduction/ADduction: AAROM;Both;5 reps;Supine    General Comments General comments (skin integrity, edema, etc.): BP 123/73 (88) seated EOB, pt c/o dizziness; SPO2 94-97% with exertion on 2L O2 [place], HR 85-92 bpm with exertion; BP 131/69 (87) after transfer to chair, pt still c/o dizziness (mild)      Pertinent Vitals/Pain Pain Assessment: Faces Faces Pain Scale: Hurts a little bit Pain Location: generalized discomfort/grimacing Pain Descriptors / Indicators: Grimacing Pain Intervention(s): Monitored during session;Repositioned;Limited activity within patient's tolerance     PT Goals (current goals can now be found in the care plan section) Acute Rehab PT Goals Patient Stated Goal: to rest PT Goal Formulation: With patient Time For Goal Achievement: 05/31/21 Progress towards PT goals: Progressing toward goals    Frequency    Min 3X/week      PT Plan Discharge plan needs to be updated       AM-PAC PT "6 Clicks" Mobility   Outcome Measure  Help needed turning from your back to your side while in a flat bed without using bedrails?: A Little Help needed moving from lying on your back to sitting on the side of a flat bed without using bedrails?: A Little Help needed moving to and from a bed to a chair (including a wheelchair)?: A Little Help needed standing up from a chair using your arms (e.g., wheelchair or bedside chair)?: A Little Help needed to walk in hospital room?: A Lot Help needed climbing 3-5 steps with a railing? : Total 6 Click Score: 15    End of Session Equipment Utilized During Treatment: Gait belt;Oxygen Activity Tolerance: Patient limited by fatigue Patient left: in chair;with call bell/phone within reach;with chair alarm set Nurse Communication: Mobility status PT  Visit Diagnosis: Other abnormalities of gait and mobility (R26.89);Difficulty in walking, not elsewhere classified (R26.2)     Time: [id number] PT Time Calculation (min) (ACUTE ONLY): 21 min  Charges:  $Therapeutic Activity: 8-22 mins                     Dana Dirocco P., PTA Acute Rehabilitation Services Pager: [phone] Office: [place] 05/27/2021, 3:20 PM

## 2021-05-27 NOTE — Progress Notes (Addendum)
      [address]       [place] [address]             [phone]       22 Days Post-Op Procedure(s) (LRB): THORACOTOMY MAJOR REPAIR PERFORATED ESOPHAGUS (Right)  Subjective: Patient awake, alert, denies pain or nausea.  Still not having much appetite. Calorie count is underway.   Objective: Vital signs in last 24 hours: Temp:  [97.6 F (36.4 C)-98.7 F (37.1 C)] 98.7 F (37.1 C) (08/15 0411) Pulse Rate:  [80-89] 89 (08/15 1013) Cardiac Rhythm: Atrial fibrillation (08/15 0800) Resp:  [14-26] 21 (08/15 0411) BP: (102-111)/(57-64) 105/61 (08/15 1013) SpO2:  [96 %-100 %] 100 % (08/15 0411) Weight:  [69.4 kg] 69.4 kg (08/15 0626)  Intake/Output from previous day: 08/14 0701 - 08/15 0700 In: 1962.9 [P.O.:340; I.V.:1572.9] Out: 1200 [Urine:1200]   Physical Exam:  Cardiovascular: irregular rhythm- Atrial fibrillation on monitor.  Pulmonary: Clear to auscultation bilaterally, clear, non-productive cough.  Abdomen: Soft, non tender, bowel sounds present. Extremities:No lower extremity edema.  Wounds: Clean and dry.  All staples have been removed from the rith thoracotomy incision. No erythema or signs of infection.   Lab Results: CBC: Recent Labs    05/27/21 0410  WBC 9.3  HGB 10.1*  HCT 31.4*  PLT 323    BMET:  Recent Labs    05/26/21 0055 05/27/21 0410  NA 137 136  K 3.9 3.9  CL 102 102  CO2 26 28  GLUCOSE 104* 68*  BUN 35* 35*  CREATININE 0.76 0.64  CALCIUM 8.6* 8.3*     PT/INR: No results for input(s): LABPROT, INR in the last 72 hours. ABG:  INR: Will add last result for INR, ABG once components are confirmed Will add last 4 CBG results once components are confirmed  Assessment/Plan:  1. CV - Post op NSTEMI.  Paroxysmal a-fib since surgery. Currently in SR.   On Lopressor 25 mg bid and Apixaban  2.  Pulmonary - Acute hypoxic respiratory failure. S/p acute PE. She has been on HFNC and now down to 3 liters.Pulmonary/CCM  following. 3. GI-She had modified barium swallow 8/12-> Per speech pathology, Dysphagia 1 (puree) solids, patient to be seated upright 90 degrees for meals, TPN currently off.  4. Deconditioned-continue PT, Not felt to be a candidate for CIR. SNF placement is being considered.  5. Anemia-Hct trending up   Arlis Porta. RoddenberryPA-C 05/27/2021,4:30 PM [phone]    Chart reviewed, patient examined, agree with above. She is doing fairly well overall considering her problems at 80 years old after esophageal perforation. The goal now is continuing to work on nutrition and physical therapy. Discussed status with her family at bedside.

## 2021-05-27 NOTE — Progress Notes (Signed)
Nutrition Follow-up  DOCUMENTATION CODES:   Severe malnutrition in context of acute illness/injury  INTERVENTION:   -Initiate 48 hour calorie count -Continue TPN to meet 100% estimated nutritional needs -Recommend continuing TPN until diet advanced and pt consuming at least 50% of meals consistently   -Obtain daily weights    -Continue Ensure Enlive po BID, each supplement provides 350 kcal and 20 grams of protein -Continue Magic cup BID with meals, each supplement provides 290 kcal and 9 grams of protein -Continue feeding assistance with all meals given UE weakness   NUTRITION DIAGNOSIS:   Severe Malnutrition related to acute illness as evidenced by moderate fat depletion, mild fat depletion, moderate muscle depletion.  Ongoing  GOAL:   Patient will meet greater than or equal to 90% of their needs  Met with TPN  MONITOR:   Diet advancement, I & O's, Labs, Weight trends, Skin (TPN)  REASON FOR ASSESSMENT:   Consult Calorie Count  ASSESSMENT:   80 yo female admitted post recent lap cholecystectomy with acute respiratory failure and renal failure with severe lactic acidosis from sepsis and found to have esophageal perforation, empyema. PMH includes DM, HTN, HLD, CAD  7/21 Lap chole 7/23 Admitted, Intubated, Pigtail by CCS in ED with bilious output 7/24 CRRT initiated, R Thoracotomy, repair of esophageal perforation, drainage of empyema and decortication of right lung 7/26 Extubated, TPN initiated 7/31 Stop CRRT 8/01 Barium swallow, no leak 8/02 CL diet started 8/12 s/p MBSS- advanced to dysphagia 1 diet with thin liquids   Reviewed I/O's: +763 ml x 24 hours and +8.2 L since 05/30/21  UOP: 1.2 L x 24 hours  Pt receiving nursing care at time of visit. Unable to speak with pt at this time. Noted calorie count has been ordered for today to better assess oral intake.   Pt remains on cyclic TPN (3419 ml over 16 hours) which provides 1803 kcals and 111 grams protein,  meeting 100% of estimated kcal and protein needs.   Case discussed with SLP, who just evaluated pt prior to RD visit. Pt is very weak and has prolonged mastication. Per SLP, pt took extended time and effort to chew a bite of peaches with dentures, which she spit most of out. Pt would like to continue with puree texture, which will help with energy conservation with oral intake. Documented oral intake over the weekend is 5-15%. Suspect intake will continue to be inadequate and pt will require prolonged nutrition support.   Labs reviewed: CBGS: 67-106 (inpatient orders for glycemic control are 0-6 units insulin aspart every 4 hours).    Diet Order:   Diet Order             DIET - DYS 1 Room service appropriate? No; Fluid consistency: Thin  Diet effective now                   EDUCATION NEEDS:   Not appropriate for education at this time  Skin:  Skin Assessment: Skin Integrity Issues: Skin Integrity Issues:: DTI DTI: buttocks Stage I: sacrum Incisions: abdomen/chest  Last BM:  05/27/21  Height:   Ht Readings from Last 1 Encounters:  05/04/21 _0  (1.549 m)    Weight:   Wt Readings from Last 1 Encounters:  05/27/21 69.4 kg   BMI:  Body mass index is 28.91 kg/m.  Estimated Nutritional Needs:   Kcal:  1750-2030 kcals  Protein:  90-120 grams  Fluid:  >/= 1.8 L    Loistine Chance, RD,  LDN, [place] Registered Dietitian II Certified Diabetes Care and Education Specialist Please refer to [facility] for RD and/or RD on-call/weekend/after hours pager

## 2021-05-27 NOTE — Progress Notes (Signed)
Inpatient Diabetes Program Recommendations  AACE/ADA: New Consensus Statement on Inpatient Glycemic Control (2015)  Target Ranges:  Prepandial:   less than 140 mg/dL      Peak postprandial:   less than 180 mg/dL (1-2 hours)      Critically ill patients:  140 - 180 mg/dL   Lab Results  Component Value Date   GLUCAP 106 (H) 05/27/2021   HGBA1C 7.4 (H) 04/26/2021    Review of Glycemic Control Results for Dana Coleman, Dana Coleman (MRN [id number]) as of 05/27/2021 10:12  Ref. Range 05/26/2021 21:02 05/26/2021 23:43 05/27/2021 04:05 05/27/2021 04:08 05/27/2021 07:43  Glucose-Capillary Latest Ref Range: 70 - 99 mg/dL 132 (H) Novolog 2 units 133 (H) Novolog 2 units 67 (L) 86 106 (H)   Current orders for Inpatient glycemic control: TPN decreased to 75 units, Novolog 0-24 units correction q 4 hrs.  Inpatient Diabetes Program Recommendations:   Noted hypoglycemia post Novolog correction.  -Consider decrease in Novolog correction to 0-9 units q 4 hrs. Secure chat sent to Resident Orvis Brill.  Thank you, Nani Gasser. Dredyn Gubbels, RN, MSN, CDE  Diabetes Coordinator Inpatient Glycemic Control Team Team Pager [phone] (8am-5pm) 05/27/2021 10:17 AM

## 2021-05-28 DIAGNOSIS — J810 Acute pulmonary edema: Secondary | ICD-10-CM | POA: Diagnosis not present

## 2021-05-28 DIAGNOSIS — K223 Perforation of esophagus: Secondary | ICD-10-CM | POA: Diagnosis not present

## 2021-05-28 DIAGNOSIS — I2699 Other pulmonary embolism without acute cor pulmonale: Secondary | ICD-10-CM | POA: Diagnosis not present

## 2021-05-28 DIAGNOSIS — J9601 Acute respiratory failure with hypoxia: Secondary | ICD-10-CM | POA: Diagnosis not present

## 2021-05-28 LAB — BASIC METABOLIC PANEL
Anion gap: 7 (ref 5–15)
BUN: 30 mg/dL — ABNORMAL HIGH (ref 8–23)
CO2: 27 mmol/L (ref 22–32)
Calcium: 8.7 mg/dL — ABNORMAL LOW (ref 8.9–10.3)
Chloride: 100 mmol/L (ref 98–111)
Creatinine, Ser: 0.53 mg/dL (ref 0.44–1.00)
GFR, Estimated: >60 mL/min (ref >60)
Glucose, Bld: 99 mg/dL (ref 70–99)
Potassium: 4.2 mmol/L (ref 3.5–5.1)
Sodium: 134 mmol/L — ABNORMAL LOW (ref 135–145)

## 2021-05-28 LAB — GLUCOSE, CAPILLARY
Glucose-Capillary: 106 mg/dL — ABNORMAL HIGH (ref 70–99)
Glucose-Capillary: 115 mg/dL — ABNORMAL HIGH (ref 70–99)
Glucose-Capillary: 130 mg/dL — ABNORMAL HIGH (ref 70–99)
Glucose-Capillary: 132 mg/dL — ABNORMAL HIGH (ref 70–99)
Glucose-Capillary: 135 mg/dL — ABNORMAL HIGH (ref 70–99)
Glucose-Capillary: 139 mg/dL — ABNORMAL HIGH (ref 70–99)
Glucose-Capillary: 152 mg/dL — ABNORMAL HIGH (ref 70–99)

## 2021-05-28 MED ORDER — BOOST / RESOURCE BREEZE PO LIQD CUSTOM: 1.0000 | Freq: Three times a day (TID) | ORAL | Status: DC

## 2021-05-28 MED ORDER — TRACE MINERALS CU-MN-SE-ZN 300-55-60-3000 MCG/ML IV SOLN
INTRAVENOUS | Status: AC
  Filled 2021-05-28: qty 745.53

## 2021-05-28 NOTE — Progress Notes (Signed)
FPTS Interim Night Progress Note  S:Patient sleeping comfortably.  Rounded with primary night RN.  No concerns voiced.  No orders required.    O: Today's Vitals   05/27/21 1940 05/27/21 2321 05/28/21 0419 05/28/21 0500  BP:  121/68    Pulse:  84    Resp:  18 18   Temp:  98.6 F (37 C) 98.3 F (36.8 C)   TempSrc:  Oral Oral   SpO2:  100%    Weight:    69.5 kg  Height:      PainSc: 0-No pain         A/P: Continue current management  Carollee Leitz MD PGY-3, [place] Medicine Service pager [phone]

## 2021-05-28 NOTE — NC FL2 (Signed)
[place] LEVEL OF CARE SCREENING TOOL     IDENTIFICATION  Patient Name: Dana Coleman Birthdate: 21-Dec-1940 Sex: female Admission Date (Current Location): 05/04/2021  [facility] and [place] Number:  Herbalist and Address:  [facility] [place] [place] [address], [place], [place] [address]      Provider Number: [id number]  Attending Physician Name and Address:  McDiarmid, Blane Ohara, MD  Relative Name and Phone Number:       Current Level of Care: Hospital Recommended Level of Care: [place] Prior Approval Number:    Date Approved/Denied:   PASRR Number: [id number]  Discharge Plan: SNF    Current Diagnoses: Patient Active Problem List   Diagnosis Date Noted   On total parenteral nutrition (TPN) 05/25/2021   Impaired functional mobility, balance, gait, and endurance 05/25/2021   S/P thoracotomy    NSTEMI (non-ST elevated myocardial infarction) ([place]) 05/23/2021   Heart failure with reduced ejection fraction ([place]) 05/23/2021   Empyema ([employer])    Acute pulmonary embolism ([place])    Acute respiratory failure with hypoxemia ([place])    Acute pulmonary edema ([place])    Acute respiratory failure with hypoxia ([place])    Acute renal failure ([place])    Hydropneumothorax    Protein-calorie malnutrition, severe 05/15/2021   Pressure injury of skin 05/05/2021   Esophageal perforation 05/05/2021   Vaginal itching 03/25/2021   Left hip pain 02/12/2021   GERD (gastroesophageal reflux disease) 12/19/2020   Neck muscle spasm 02/16/2019   Breast lump 12/07/2018   Plantar fasciitis, right 09/30/2018   Plantar wart 11/17/2017   Insulin dependent type 2 diabetes mellitus, uncontrolled ([place]) 08/14/2014   Debility 06/30/2011   History of thyroid nodule 01/16/2011   History of breast cancer (right)  stage 1 ERA positive    History of cervical cancer    Hyperlipidemia    Hypertension    CAD (coronary artery disease), native coronary artery     Osteopenia 12/10/2006    Orientation RESPIRATION BLADDER Height & Weight     Self, Time, Situation, Place  Normal External catheter, Incontinent Weight: 153 lb 3.5 oz (69.5 kg) Height:  '5\' 1"'$  (154.9 cm)  BEHAVIORAL SYMPTOMS/MOOD NEUROLOGICAL BOWEL NUTRITION STATUS      Continent Diet (Diet: dysphagia 1, thin liquids,Supplements: Ensure Enlive BID, Magic Cup BID)  AMBULATORY STATUS COMMUNICATION OF NEEDS Skin   Supervision Verbally Surgical wounds (wound/incision skin tear,bakc lateral RT, Pressure injury sacrum nedial stage I, pressure injury buttocks left, stage II, closed incision right,abdomen.)                       Personal Care Assistance Level of Assistance  Bathing, Feeding, Dressing Bathing Assistance: Maximum assistance Feeding assistance: Limited assistance Dressing Assistance: Maximum assistance     Functional Limitations Info  Sight, Hearing Sight Info: Adequate Hearing Info: Adequate      SPECIAL CARE FACTORS FREQUENCY  PT (By licensed PT), OT (By licensed OT)     PT Frequency: 5x per week OT Frequency: 5x per week            Contractures Contractures Info: Not present    Additional Factors Info  Code Status, Allergies (currently Recommend continuing TPN until diet advanced and pt consuming at least 50% of meals consistently) Code Status Info: Partial Allergies Info: NKA           Current Medications (05/28/2021):  This is the current hospital active medication list Current Facility-Administered  Medications  Medication Dose Route Frequency Provider Last Rate Last Admin   0.9 %  sodium chloride infusion   Intravenous PRN Candee Furbish, MD 10 mL/hr at 05/26/21 1500 Infusion Verify at 05/26/21 1500   acetaminophen (TYLENOL) tablet 650 mg  650 mg Oral Q4H PRN Sharion Settler, DO       apixaban (ELIQUIS) tablet 10 mg  10 mg Oral BID Sharion Settler, DO   10 mg at 05/28/21 1034   Followed by   Derrill Memo ON 05/31/2021] apixaban (ELIQUIS) tablet  5 mg  5 mg Oral BID Sharion Settler, DO       chlorhexidine (PERIDEX) 0.12 % solution 15 mL  15 mL Mouth Rinse BID Candee Furbish, MD   15 mL at 05/28/21 1036   Chlorhexidine Gluconate Cloth 2 % PADS 6 each  6 each Topical Daily Candee Furbish, MD   6 each at 05/28/21 [id number]   feeding supplement (BOOST / RESOURCE BREEZE) liquid 1 Container  1 Container Oral TID BM Brimage, Vondra, DO       Gerhardt's butt cream 1 application  1 application Topical QID Icard, Bradley L, DO   1 application at [id number] 1053   insulin aspart (novoLOG) injection 0-6 Units  0-6 Units Subcutaneous Q4H Espinoza, Alejandra, DO   1 Units at 05/27/21 2031   lidocaine (LIDODERM) 5 % 1 patch  1 patch Transdermal Q24H Corey Harold, NP   1 patch at 05/27/21 2006   MEDLINE mouth rinse  15 mL Mouth Rinse q12n4p Candee Furbish, MD   15 mL at 05/27/21 1411   metoprolol tartrate (LOPRESSOR) tablet 25 mg  25 mg Oral BID Carollee Leitz, MD   25 mg at 05/28/21 1035   pantoprazole (PROTONIX) EC tablet 40 mg  40 mg Oral Daily Carollee Leitz, MD       ramelteon (ROZEREM) tablet 4 mg  4 mg Oral QHS PRN Carollee Leitz, MD       sacubitril-valsartan (ENTRESTO) 24-26 mg per tablet  1 tablet Oral BID McDiarmid, Blane Ohara, MD   1 tablet at 05/28/21 1035   sodium chloride (OCEAN) 0.65 % nasal spray 2 spray  2 spray Each Nare PRN Orvis Brill, DO   2 spray at 05/19/21 2130   sodium chloride flush (NS) 0.9 % injection 10-40 mL  10-40 mL Intracatheter Q12H Candee Furbish, MD   10 mL at 05/28/21 1053   sodium chloride flush (NS) 0.9 % injection 10-40 mL  10-40 mL Intracatheter PRN Candee Furbish, MD   40 mL at [id number]   TPN CYCLIC-ADULT (ION)   Intravenous Cyclic-See Admin Instructions Bertis Ruddy, [facility]         Discharge Medications: Please see discharge summary for a list of discharge medications.  Relevant Imaging Results:  Relevant Lab Results:   Additional Information SSN [id number]   House COVID-19 Vaccine 07/28/2020 ,  11/24/2019 , 11/03/2019,03/25/2021  Vinie Sill, LCSW

## 2021-05-28 NOTE — TOC Initial Note (Signed)
Transition of Care [facility]) - Initial/Assessment Note    Patient Details  Name: Dana Coleman MRN: [id number] Date of Birth: [birth year]  Transition of Care [facility]) CM/SW Contact:    Vinie Sill, LCSW Phone Number: 05/28/2021, 10:11 AM  Clinical Narrative:                  CSW met with patient. CSW introduced self and explained role. CSW discussed with patient therapy recommendation of short term rehab at [facility]. Patient states she was unsure about SNF placement. She inquired if she can get HH. Patient states her sister,[name] & brother lives across the street from her and they have agreed to provided the needed care. Patient was agreeable to send out SNF referrals until she and family has decided on their plan. CSW explained the SNF process. Patient states no preferred SNF. Patient states no questions at this time.   CSW called patient's sister - no answer  CSW will continue to follow and assist with discharge planning.  Thurmond Butts, MSW, LCSW Clinical Social Worker    Expected Discharge Plan: Skilled Nursing Facility Barriers to Discharge: Continued Medical Work up, SNF Pending bed offer   Patient Goals and CMS Choice        Expected Discharge Plan and Services Expected Discharge Plan: [place] In-house Referral: Clinical Social Work     Living arrangements for the past 2 months: Single Family Home                                      Prior Living Arrangements/Services Living arrangements for the past 2 months: Single Family Home Lives with:: Self Patient language and need for interpreter reviewed:: No        Need for Family Participation in Patient Care: Yes (Comment) Care giver support system in place?: Yes (comment)   Criminal Activity/Legal Involvement Pertinent to Current Situation/Hospitalization: No - Comment as needed  Activities of Daily Living Home Assistive Devices/Equipment: CBG Meter, Eyeglasses, Dentures (specify  type) ADL Screening (condition at time of admission) Patient's cognitive ability adequate to safely complete daily activities?: Yes Is the patient deaf or have difficulty hearing?: No Does the patient have difficulty seeing, even when wearing glasses/contacts?: No Does the patient have difficulty concentrating, remembering, or making decisions?: No Patient able to express need for assistance with ADLs?: Yes Does the patient have difficulty dressing or bathing?: No Independently performs ADLs?: Yes (appropriate for developmental age) Does the patient have difficulty walking or climbing stairs?: Yes Weakness of Legs: Left Weakness of Arms/Hands: None  Permission Sought/Granted Permission sought to share information with : Family Supports Permission granted to share information with : Yes, Verbal Permission Granted  Share Information with NAME: Irina Okelly  Permission granted to share info w AGENCY: SNFs  Permission granted to share info w Relationship: sister  Permission granted to share info w Contact Information: [phone]  Emotional Assessment Appearance:: Appears stated age Attitude/Demeanor/Rapport: Engaged Affect (typically observed): Pleasant, Appropriate Orientation: : Oriented to Self, Oriented to Place, Oriented to  Time, Oriented to Situation Alcohol / Substance Use: Not Applicable Psych Involvement: No (comment)  Admission diagnosis:  Respiratory failure (HCC) [J96.90] Hyperkalemia [E87.5] Acute respiratory failure with hypoxia ([place]) [J96.01] Acute renal failure, unspecified acute renal failure type ([place]) [N17.9] Esophageal perforation [K22.3] Patient Active Problem List   Diagnosis Date Noted   On total parenteral nutrition (TPN) 05/25/2021   Impaired  functional mobility, balance, gait, and endurance 05/25/2021   S/P thoracotomy    NSTEMI (non-ST elevated myocardial infarction) ([place]) 05/23/2021   Heart failure with reduced ejection fraction ([place]) 05/23/2021    Empyema ([place])    Acute pulmonary embolism (HCC)    Acute respiratory failure with hypoxemia (HCC)    Acute pulmonary edema (HCC)    Acute respiratory failure with hypoxia (HCC)    Acute renal failure (HCC)    Hydropneumothorax    Protein-calorie malnutrition, severe 05/15/2021   Pressure injury of skin 05/05/2021   Esophageal perforation 05/05/2021   Vaginal itching 03/25/2021   Left hip pain 02/12/2021   GERD (gastroesophageal reflux disease) 12/19/2020   Neck muscle spasm 02/16/2019   Breast lump 12/07/2018   Plantar fasciitis, right 09/30/2018   Plantar wart 11/17/2017   Insulin dependent type 2 diabetes mellitus, uncontrolled ([place]) 08/14/2014   Debility 06/30/2011   History of thyroid nodule 01/16/2011   History of breast cancer (right)  stage 1 ERA positive    History of cervical cancer    Hyperlipidemia    Hypertension    CAD (coronary artery disease), native coronary artery    Osteopenia 12/10/2006   PCP:  Gifford Shave, MD Pharmacy:   [id number] [place] [address] [id number]: [phone]: [phone]    Social Determinants of Health (SDOH) Interventions    Readmission Risk Interventions No flowsheet data found.

## 2021-05-28 NOTE — Progress Notes (Signed)
Calorie Count Note  48 hour calorie count ordered.  Diet: dysphagia 1, thin liquids  Supplements: Ensure Enlive BID, Magic Cup BID  Day 1 results   Lunch: 0 kcal, 0 g protein  Dinner: 150 kcal, 5 g protein Breakfast: 100 kcal, 1 g protein Supplements: 350 kcal, 20 g protein  Total intake: 600 kcal (34% of minimum estimated needs)  26 protein (29% of minimum estimated needs)  Estimated Nutritional Needs:  Kcal:  1750-2030 kcals Protein:  90-120 grams Fluid:  >/= 1.8 L  Nutrition Dx: Severe Malnutrition related to acute illness as evidenced by moderate fat depletion, mild fat depletion, moderate muscle depletion- Ongoing  Goal: Patient will meet greater than or equal to 90% of their needs- not meeting by mouth  Intervention:   Continue TPN to meet 100% estimated nutritional needs  Recommend continuing TPN until diet advanced and pt consuming at least 50% of meals consistently  Continue Ensure Enlive po BID, each supplement provides 350 kcal and 20 grams of protein Continue Magic cup BID with meals, each supplement provides 290 kcal and 9 grams of protein Continue feeding assistance with all meals given UE weakness   Aloise Copus MS, RD, LDN, CNSC Clinical Nutrition Pager listed in [place]

## 2021-05-28 NOTE — Progress Notes (Signed)
Mobility Specialist: Progress Note   05/28/21 1611  Mobility  Activity Dangled on edge of bed  Level of Assistance Minimal assist, patient does 75% or more  Assistive Device None  Mobility Sit up in bed/chair position for meals  Mobility Response Tolerated fair  Mobility performed by Mobility specialist  $Mobility charge 1 Mobility   Pre-Mobility on 1.5 L/min [place]: 87 HR, 126/71 BP, 98% SpO2 Post-Mobility on RA: 88 HR, 135/74 BP, 94% SpO2  Pt declined OOB but was agreeable to sitting EOB and performing LE exercises. Pt c/o feeling weak today and dizzy upon sitting EOB, VSS. Pt was able to wean off O2 during session. Pt back in bed after session with call bell and phone at her side.   [facility] Mykia Holton Mobility Specialist Mobility Specialist Phone: [phone]

## 2021-05-28 NOTE — Progress Notes (Signed)
PHARMACY - TOTAL PARENTERAL NUTRITION CONSULT NOTE  Indication: s/p esophageal perforation and repair  Patient Measurements: Height: '5\' 1"'$  (154.9 cm) Weight: 69.5 kg (153 lb 3.5 oz) IBW/kg (Calculated) : 47.8   Body mass index is 28.95 kg/m.  Assessment:  80 y/o female with recent lap chole for symptomatic cholelithiasis on 7/21. PMH including HTN, HLD, DM, R breast cancer, and CAD. Patient presented on 7/23 with AMS and unresponsiveness - was complaining of abdominal discomfort and then patient became unresponsive and slumped on table.  S/p gall bladder surgery and esophageal perforation-repaired 7/24.  Pharmacy consulted for TPN management.  Glucose / Insulin: DM with A1c 7.4% on Lantus 24/d PTA.  CBGs 99-130s, (previous only running lower mid-day when TPN was off)  Utilized 1 units sens SSI + 75 units insulin in TPN - decreased 8/15  Electrolytes: Na 134, others wnl  Renal: septic ATN s/p CRRT (end 7/25) - SCr < 1 and stable, BUN at 30 Hepatic: LFTs significantly increased 8/4 (decreased with holding TPN for 6 hours and Lasix), tbili WNL, albumin down 1.9, prealbumin up 8.9, TG WNL Intake / Output; MIVF:  UOP 0.7 ml/kg/hr, LBM 8/12 - 15% meal intake: 232m documented + Ensure x 2   GI Imaging: - 7/23 CT: no acute abnormalities  GI Surgeries / Procedures:  - 7/21: s/p laparoscopic cholecystectomy and LOA - 7/24: s/p R thoracotomy, repair of esophageal perf with muscle flap, drainage of empyema and decortication of R lung  Central access: PICC 7/26 (double lumen) TPN start date: 05/07/21  Nutritional Goals (per RD rec on 8/9): kCal: 1750-2030 , Protein: 90-120 , Fluid: >=1.8L  Current Nutrition:  TPN Ensure Enlive BID 8/12 Dysphagia 1 diet, 15% intake No feeding tube recommended due to esophageal injury  Plan:  Continue concentrated cyclic TPN over 16 hr at 1575 mL over 16 hrs: 53 mL/hr x 1 hr, then 105 mL/hr x 14 hrs, then 53 mL/hr x 1 hr. TPN provides 111g AA, 230g CHO,  and 58g ILE for total of 1803 kCal, meeting 100% of patient's needs. Electrolytes in TPN: Na 30 mEq/L (= 47 mEq), K 30 mEq/L (= 47 mEq), Ca 081m/L since 7/30, Mg 11 mEq/L (= 17 mEq), Phos 30 mmol/L (= 47 mmol), maximum acetate Add standard MVI and trace elements to TPN Continue sens SSI Q4H and continue insulin in TPN at 75 units. Watch mid-day lower CBGs and encourage po intake BMP in AM  JoBertis RuddyPharmD Clinical Pharmacist Please check AMION for all MCSilver Lakeumbers 05/28/2021 8:46 AM

## 2021-05-28 NOTE — Progress Notes (Signed)
FPTS Brief Progress Note  S Saw patient at bedside this evening. Patient was sleeping comfortably. I did not wake the patient.  O: BP 132/75 (BP Location: Right Arm)   Pulse 100   Temp 98.8 F (37.1 C) (Oral)   Resp 20   Ht '5\' 1"'$  (1.549 m)   Wt 69.5 kg   SpO2 99%   BMI 28.95 kg/m    General: sleeping comfortably   A/P: Plan per day team  Pending SNF placement  - Orders reviewed. Labs for AM ordered, which was adjusted as needed.   Lattie Haw, MD 05/28/2021, 10:17 PM PGY-3, [place] [facility] Night Resident  Please page [phone] with questions.

## 2021-05-28 NOTE — Progress Notes (Addendum)
      [address]       [place] [address]             [phone]       23 Days Post-Op Procedure(s) (LRB): THORACOTOMY MAJOR REPAIR PERFORATED ESOPHAGUS (Right)  Subjective: Patient awake, seemed to be in better spirits this morning. She reports she did better with eating supper last evening but I am not able to find oral intake documented.  No new problems or concerns.   Objective: Vital signs in last 24 hours: Temp:  [98.3 F (36.8 C)-98.9 F (37.2 C)] 98.3 F (36.8 C) (08/16 1129) Pulse Rate:  [84-86] 85 (08/16 1129) Cardiac Rhythm: Heart block;Bundle branch block (08/16 0700) Resp:  [18-20] 20 (08/16 1129) BP: (115-134)/(62-79) 124/79 (08/16 1129) SpO2:  [99 %-100 %] 100 % (08/16 1129) Weight:  [69.5 kg] 69.5 kg (08/16 0500)  Intake/Output from previous day: 08/15 0701 - 08/16 0700 In: 1260 [I.V.:1260] Out: 350 [Urine:350]   Physical Exam:  Cardiovascular: SR.  Pulmonary: Clear to auscultation bilaterally, clear, non-productive cough.  Abdomen: Soft, non tender, bowel sounds present. Extremities:No lower extremity edema.  Wounds: Clean and dry.  No erythema or signs of infection.   Lab Results: CBC: Recent Labs    05/27/21 0410  WBC 9.3  HGB 10.1*  HCT 31.4*  PLT 323    BMET:  Recent Labs    05/27/21 0410 05/28/21 0530  NA 136 134*  K 3.9 4.2  CL 102 100  CO2 28 27  GLUCOSE 68* 99  BUN 35* 30*  CREATININE 0.64 0.53  CALCIUM 8.3* 8.7*     PT/INR: No results for input(s): LABPROT, INR in the last 72 hours. ABG:  INR: Will add last result for INR, ABG once components are confirmed Will add last 4 CBG results once components are confirmed  Assessment/Plan:  1. CV - Post op NSTEMI.  Paroxysmal a-fib since surgery. Currently in SR.   On Lopressor 25 mg bid and Apixaban  2.  Pulmonary - Acute hypoxic respiratory failure. S/p acute PE. She has been on HFNC and now down to 1.5 liters nasal cannula. Pulmonary/CCM has signed  off.  3. GI-She had modified barium swallow 8/12-> Per speech pathology, Dysphagia 1 (puree) solids, patient to be seated upright 90 degrees for meals, receiving TPN 16 hrs/day. PO intake reportedly improving but calorie count is pending.  4. Deconditioned-continue PT, Not felt to be a candidate for CIR. SNF placement is being considered.     Joline Maxcy 05/28/2021,12:41 PM [phone]    Chart reviewed, patient examined, agree with above. Continue to encourage nutrition and PT.

## 2021-05-28 NOTE — Progress Notes (Signed)
Occupational Therapy Treatment Patient Details Name: Dana Coleman MRN: [id number] DOB: 02-18-1941 Today's Date: 05/28/2021    History of present illness Pt is a 80 yo female admitted on 05/04/21 for AMS. HX of cholecystectomy on 7/21.  Intubated upon admission, 7/24 Started CRRT, Esophageal perforation with septic shock post flap repair, drainage of empyema and decortication of R lung with chest tube placed.  7/25 EKG changes and probable nstemi. Extubated 7/26. CT removed 8/4. Pt with new PE with R heart strain and increased O2 HFNC requirements 8/8. PMH includes: R breast CA, DM, CAD, HTN.   OT comments  Patient supine in bed and agreeable to OT.  Min assist for bed mobility and sit to stand transfers with max encouragement and greatly increased time.  Incontinent of BM upon standing and requires total assist +2 for hygiene.  Total assist for LB dressing. Updated dc plan to SNF due to decreased activity tolerance requiring slower paced rehab. VSS on 2L [place]. Will follow acutely.    Follow Up Recommendations  SNF;Supervision/Assistance - 24 hour    Equipment Recommendations  3 in 1 bedside commode    Recommendations for Other Services      Precautions / Restrictions Precautions Precautions: Fall Precaution Comments: watch respiratory status Restrictions Weight Bearing Restrictions: No       Mobility Bed Mobility Overal bed mobility: Needs Assistance Bed Mobility: Rolling;Sidelying to Sit Rolling: Min assist Sidelying to sit: Min assist       General bed mobility comments: min assist to elevate trunk, and fully manage LEs increased time and max encouragement    Transfers Overall transfer level: Needs assistance Equipment used: Rolling walker (2 wheeled) Transfers: Sit to/from [facility] Sit to Stand: Min assist Stand pivot transfers: Min assist       General transfer comment: min assist to power up and steady, cueing for hand placmenet and max  encouragement    Balance Overall balance assessment: Needs assistance Sitting-balance support: No upper extremity supported;Feet supported Sitting balance-Leahy Scale: Fair Sitting balance - Comments: supervision   Standing balance support: Bilateral upper extremity supported Standing balance-Leahy Scale: Poor Standing balance comment: walker and min assist for dynamic standing                           ADL either performed or assessed with clinical judgement   ADL Overall ADL's : Needs assistance/impaired     Grooming: Wash/dry face;Set up;Sitting               Lower Body Dressing: Total assistance;Sit to/from stand Lower Body Dressing Details (indicate cue type and reason): assist for socks, min assist sit to stand but relies on BUE support Toilet Transfer: Minimal assistance;Stand-pivot;RW Toilet Transfer Details (indicate cue type and reason): simulated to recliner Toileting- Clothing Manipulation and Hygiene: Total assistance;+2 for safety/equipment;Sit to/from stand Toileting - Clothing Manipulation Details (indicate cue type and reason): incontient BM upon standing, total assist for hygiene     Functional mobility during ADLs: Minimal assistance;Rolling walker       Vision       Perception     Praxis      Cognition Arousal/Alertness: Awake/alert Behavior During Therapy: WFL for tasks assessed/performed;Flat affect Overall Cognitive Status: Impaired/Different from baseline Area of Impairment: Problem solving;Awareness                           Awareness: Emergent Problem Solving: Slow  processing;Requires verbal cues;Decreased initiation General Comments: pt with slow processing, max encouragement; poor awareness to having BM when standing and somewhat self limiting        Exercises Exercises: General Upper Extremity General Exercises - Upper Extremity Shoulder Flexion: AAROM;Both;10 reps;Supine Elbow Flexion: AROM;Both;10  reps;Supine Digit Composite Flexion: AROM;Both;10 reps;Supine   Shoulder Instructions       General Comments VSS on 2L [place]    Pertinent Vitals/ Pain       Pain Assessment: Faces Faces Pain Scale: Hurts a little bit Pain Location: generalized discomfort/grimacing Pain Descriptors / Indicators: Grimacing Pain Intervention(s): Monitored during session;Repositioned;Limited activity within patient's tolerance  Home Living                                          Prior Functioning/Environment              Frequency  Min 2X/week        Progress Toward Goals  OT Goals(current goals can now be found in the care plan section)  Progress towards OT goals: Progressing toward goals  Acute Rehab OT Goals Patient Stated Goal: to rest OT Goal Formulation: With patient  Plan Discharge plan needs to be updated;Frequency remains appropriate    Co-evaluation                 AM-PAC OT "6 Clicks" Daily Activity     Outcome Measure   Help from another person eating meals?: A Little Help from another person taking care of personal grooming?: A Little Help from another person toileting, which includes using toliet, bedpan, or urinal?: Total Help from another person bathing (including washing, rinsing, drying)?: A Lot Help from another person to put on and taking off regular upper body clothing?: A Little Help from another person to put on and taking off regular lower body clothing?: Total 6 Click Score: 13    End of Session Equipment Utilized During Treatment: Oxygen;Rolling walker  OT Visit Diagnosis: Other abnormalities of gait and mobility (R26.89);Muscle weakness (generalized) (M62.81);Pain;Other symptoms and signs involving cognitive function Pain - part of body:  (generalized)   Activity Tolerance Patient limited by fatigue   Patient Left in chair;with call bell/phone within reach;with chair alarm set;Other (comment)   Nurse Communication Mobility  status;Other (comment) (NT aware of pt only wanting to sit up for 1 hr)        Time: [id number] OT Time Calculation (min): 36 min  Charges: OT General Charges $OT Visit: 1 Visit OT Treatments $Self Care/Home Management : 23-37 mins  [name], [place] Pager [phone] Office [phone]    [name] 05/28/2021, 10:19 AM

## 2021-05-28 NOTE — Progress Notes (Signed)
Family Medicine Teaching Service Daily Progress Note Intern Pager: [phone]  Patient name: Dana Coleman Medical record number: [id number] Date of birth: 06-21-1941 Age: 80 y.o. Gender: female  Primary Care Provider: Gifford Shave, MD Consultants: CVTS, Pulmonology, Cardiology Code Status: Partial Code  Pt Overview and Major Events to Date:   Medically stable for discharge to SNF as she was too weak for CIR.  LOS: 24 days  7/23 admitted to ICU, intubated, right pigtail catheter placed 7/24- CRRT, emergent right thoracotomy, repair of esophageal perforation with intercostal pedicled muscle flap and drainage of empyema and decortication of right lung 7/25- NSTEMI, no intervention 7/26- extubated, TPN started 8/1- esophagram without leak, PO started, heparin gtt d/c because of increased bleeding from chest tubes. Transfused 1u pRBC 8/3: anterior chest tube removed 8/4: remaining chest tube removed 8/8: increased O2 requirement, Afib RVR -> PE with right heart strain 8/9: further increased O2 requirement, Afib 8/13: started Entresto, decreasing supplemental oxygen requirement   Abx timeline Zosyn: 7/23-7/26 Eraxis: 7/23-8/12 Unasyn: 7/26-7/31 Meropenem: 7/31-8/3 Ceftriaxone: 8/3-8/11 Metronidazole: 8/3-8/11   Micro Timeline 7/23: MRSA PCR negative 7/23: pleural fluid grew strep salivarius pan-sensitive, strep mitas (sensitive CTX), rare candida albicans 7/24: blood cultures negative x2  Medically stable for discharge to SNF as she was too weak for CIR.  Assessment and Plan: Dana Coleman is a 80 y.o. female admitted for empyema followed by esophageal perforation now s/p repair of esophagus with Dana atrial fibrillation, s/p NSTEMI and Dana oxygen requirement in setting of Dana onsent HF and recent PE, overall improving with decreasing oxygen requirement.   Esophageal Repair for esophageal perforation, s/p empyema Patient POD #23. Pain well-controlled with scheduled  Tylenol. CVTS completely removed stapes on right side chest. No sign of infection. - CVTS following, greatly appreciate their care and reccommendations - tylenol scheduled q4h - completed abx and antifungal TX - decreasing oxygen requirement, currently saturating well on 1.5L    Acute Hypoxic Respiratory failure 2/2 to PE and Dana onset HF  Continues to improve. Oxygen requirement decreasing, on 1.5L [place] with saturations > 95%. Does not appear volume overloaded in any capacity with no JVD/ lower extremity edema/crackles. She has been able to move from bed to chair with PT, but otherwise has not been ambulating. - monitor respiratory status - monitor urine output - continue lasix 40 - continue entresto - PT/OT- goal this week is to work on gait   Atrial Fibrillation  Stable. Hr 80s bpm overnight.  - continue metoprolol BID - continue eliquis BID    Malnutrition, Protein calorie  Not improving. Goal is > 50% PO. PO intake not well documented for past 24 hours. Patient did not want to eat her breakfast this morning, but per RN she ate well yesterday when her granddaughter came to visit.  - continue to encourage increased PO feeding - Add Boost TID between meals - monitor CBG, particularly in the mid-day as these glucose #s have been lower - calorie count to have objective data on PO intake - continue dysphagia diet - continue TPN   T2DM  CBG ranges over past 24 hours: 80s-130s. TPN insulin decreased to 85 units, per pharmacy. - follow CBGs, goal 140-180 - monitor for hypoglycemia   - continue SSI very sensitive   FEN/GI: Dysphagia 1 PPx: Eliquis (h/o PE) Dispo:SNF pending clinical improvement . Barriers include clinical status in setting of deconditioning.   FEN/GI: Dysphagia 1 PPx: Eliquis (h/o PE) Dispo:SNF pending clinical improvement . Barriers include clinical status  in setting of deconditioning.   Subjective:  Dana Coleman feels well this morning. She has no complaints. She is  still not interested in eating food as her appetite is poor.  Objective: Temp:  [98.3 F (36.8 C)-98.9 F (37.2 C)] 98.3 F (36.8 C) (08/16 1129) Pulse Rate:  [84-86] 85 (08/16 1129) Resp:  [18-20] 20 (08/16 1129) BP: (115-134)/(62-79) 124/79 (08/16 1129) SpO2:  [99 %-100 %] 100 % (08/16 1129) Weight:  [69.5 kg] 69.5 kg (08/16 0500) Physical Exam: General: Pleasant, well-appearing, sitting upright in bed with [place] in place, breakfast tray in front of her, no acute distress Cardiovascular: RRR Respiratory: Diminished breath sounds on right side, no shortness of breath. Significantly improving from a respiratory standpoint. Stable on 1.5L [place]. Abdomen: Soft, non-tender, non-distended Extremities: BL extremities without edema, warm, dry, well-perfused  Laboratory: Recent Labs  Lab 05/23/21 0434 05/24/21 0500 05/27/21 0410  WBC 11.0* 11.1* 9.3  HGB 9.6* 9.8* 10.1*  HCT 29.3* 29.9* 31.4*  PLT 357 377 323   Recent Labs  Lab 05/23/21 0434 05/24/21 0500 05/25/21 0213 05/26/21 0055 05/27/21 0410 05/28/21 0530  NA 134* 136 135 137 136 134*  K 3.5 3.8 4.0 3.9 3.9 4.2  CL 103 106 106 102 102 100  CO2 23 22 20* '26 28 27  '$ BUN 34* 36* 39* 35* 35* 30*  CREATININE 0.75 0.68 0.78 0.76 0.64 0.53  CALCIUM 8.3* 8.6* 8.5* 8.6* 8.3* 8.7*  PROT 5.8* 5.9* 6.0*  --   --   --   BILITOT 0.4 0.4 0.4  --   --   --   ALKPHOS 65 62 59  --   --   --   ALT '30 27 26  '$ --   --   --   AST '28 28 28  '$ --   --   --   GLUCOSE 144* 108* 115* 104* 68* 99    Imaging/Diagnostic Tests: No results found.   Orvis Brill, DO 05/28/2021, 4:02 PM PGY-1, [place] Intern pager: [phone], text pages welcome

## 2021-05-29 DIAGNOSIS — I5020 Unspecified systolic (congestive) heart failure: Secondary | ICD-10-CM | POA: Diagnosis not present

## 2021-05-29 DIAGNOSIS — K223 Perforation of esophagus: Secondary | ICD-10-CM | POA: Diagnosis not present

## 2021-05-29 DIAGNOSIS — J810 Acute pulmonary edema: Secondary | ICD-10-CM | POA: Diagnosis not present

## 2021-05-29 DIAGNOSIS — E43 Unspecified severe protein-calorie malnutrition: Secondary | ICD-10-CM | POA: Diagnosis not present

## 2021-05-29 LAB — BASIC METABOLIC PANEL
Anion gap: 5 (ref 5–15)
BUN: 27 mg/dL — ABNORMAL HIGH (ref 8–23)
CO2: 29 mmol/L (ref 22–32)
Calcium: 8.7 mg/dL — ABNORMAL LOW (ref 8.9–10.3)
Chloride: 103 mmol/L (ref 98–111)
Creatinine, Ser: 0.56 mg/dL (ref 0.44–1.00)
GFR, Estimated: >60 mL/min (ref >60)
Glucose, Bld: 79 mg/dL (ref 70–99)
Potassium: 4.3 mmol/L (ref 3.5–5.1)
Sodium: 137 mmol/L (ref 135–145)

## 2021-05-29 LAB — GLUCOSE, CAPILLARY
Glucose-Capillary: 84 mg/dL (ref 70–99)
Glucose-Capillary: 163 mg/dL — ABNORMAL HIGH (ref 70–99)
Glucose-Capillary: 123 mg/dL — ABNORMAL HIGH (ref 70–99)
Glucose-Capillary: 181 mg/dL — ABNORMAL HIGH (ref 70–99)
Glucose-Capillary: 125 mg/dL — ABNORMAL HIGH (ref 70–99)

## 2021-05-29 MED ORDER — ENSURE ENLIVE PO LIQD
237.0000 mL | Freq: Two times a day (BID) | ORAL | Status: DC
  Administered 2021-05-29: 120 mL via ORAL

## 2021-05-29 MED ORDER — CLINISOL SF 15 % IV SOLN
INTRAVENOUS | Status: AC
  Filled 2021-05-29: qty 745.53

## 2021-05-29 MED ORDER — ONDANSETRON HCL 4 MG/2ML IJ SOLN
4.0000 mg | Freq: Once | INTRAMUSCULAR | Status: AC
  Administered 2021-05-29: 4 mg via INTRAVENOUS
  Filled 2021-05-29: qty 2

## 2021-05-29 NOTE — Consult Note (Addendum)
[facility] Consult: 3:44 PM 05/29/2021  LOS: 25 days    Referring Provider: Dr Sharyn Lull  Primary Care Physician:  Gifford Shave, MD Primary Gastroenterologist:  Dr. Rush Landmark    Reason for Consultation: Aversion to PO.   HPI: Dana Coleman is a 80 y.o. female.  PMH IDDM.  Breast cancer 2009.  Cervical cancer in her 42s.  MI.  Cardiac stent before 2015.  Hemithyroidectomy 2004. 11/2019 colonoscopy.  For history of adenomatous colon polyps.  Nonbleeding hemorrhoids.  3 subcentimeter polyps removed.  Pandiverticulosis. No previous EGD.  05/02/2021 laparoscopic cholecystectomy, outpatient surgery center for chronic calculus cholecystitis.  Dr. Redmond Pulling noted lysis of extensive Rochele Raring adhesions.   Patient is in her fourth week of hospitalization after presenting in late July with esophageal perforation.  Presented back to the ED postop day 3 with altered mental status, complaint of abdominal discomfort.  Prolonged ICU stay.  Underwent emergent right thoracotomy, esoph perforation repair with muscle flap, drainage of empyema, right lung decortication on 7/24.  Sustained non-STEMI, no intervention.  8/1 esophagram without demonstrable leak.  Heparin drip discontinued due to bleeding from chest tubes.  Anemia required transfusion w 3 PRBC.  AKI in setting of septic shock, hypoperfusion required CRRT.  Chest tubes removed on 8/3, 8/4.  On 8/8 had increased oxygen requirements in setting of A. fib, RVR, new onset heart failure.  Grew strep salivarius, strep mitus and rare Candida in pleural fluid with negative blood cultures.  Multiple days of broad-spectrum antibiotics, antifungal finally completing all antibiotics on 8/11.   Feeding with clear liquids initiated 8/1.  She tolerated up to full liquids.  W  solid food (currently Dysphagia 1 diet), pt not eating as well.  Staff feel fear of eating is playing a role but she also complains of nausea and vomits with solid food and sometimes with pills.  She also may vomit and have nausea with Ensure.  No sense that food is getting stuck in her esophagus.  It is when things enter the stomach that she develops the nausea and vomiting.  Not able to meet nutritional requirements to address severe malnutrition.  She had an episode of large emesis this morning and did not eat lunch.  Was receiving Protonix 40 mg IV daily but converted to 40 mg po daily yesterday. RD, nutritionist, is recommending GJ tube placement to provide adequate nutrition.  They note that upper extremity weaknessinhibits feeding herself.  She has been continued on TPN since near time of arrival.  Patient denies prior history of vomiting leading up to this admission.  Denies prior history of dysphagia.  Was quite active, driving, performing all ADLs, attending choir prior to her illness.    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   Hyperlipidemia    Hypertension    Personal history of radiation therapy 2009   Respiratory failure ([place]) 05/04/2021    Past Surgical History:  Procedure Laterality Date   BREAST LUMPECTOMY Right 2009   BREAST SURGERY  2009   right lumpectomy   [place] N/A 05/02/2021   Procedure: LAPAROSCOPIC CHOLECYSTECTOMY;  Surgeon: Greer Pickerel, MD;  Location: [facility] ORS;  Service: General;  Laterality: N/A;   COLONOSCOPY     LEFT HEART CATHETERIZATION WITH CORONARY ANGIOGRAM N/A 04/20/2014   Procedure: LEFT HEART  CATHETERIZATION WITH CORONARY ANGIOGRAM;  Surgeon: Jacolyn Reedy, MD;  Location: [facility] CATH LAB;  Service: Cardiovascular;  Laterality: N/A;   POLYPECTOMY     STENT PLACEMENT VASCULAR ([place] HX)     THORACOTOMY Right 05/05/2021   Procedure: THORACOTOMY MAJOR REPAIR PERFORATED ESOPHAGUS;  Surgeon: Gaye Pollack, MD;  Location: [facility] OR;  Service: Thoracic;  Laterality: Right;   thyroid     for nodule hemithyroidectomy, benign   THYROIDECTOMY Right 03/2003    Prior to Admission medications   Medication Sig Start Date End Date Taking? Authorizing Provider  amLODipine (NORVASC) 10 MG tablet TAKE 1 TABLET BY MOUTH EVERY DAY Patient taking differently: Take 10 mg by mouth daily. 07/20/20  Yes Gifford Shave, MD  aspirin EC 81 MG tablet Take 81 mg by mouth in the morning. Swallow whole.   Yes [provider]  Cholecalciferol (VITAMIN D3) 50 MCG (2000 UT) TABS Take 2,000 Units by mouth in the morning.   Yes [provider]  hydrochlorothiazide (HYDRODIURIL) 12.5 MG tablet TAKE 1 TABLET BY MOUTH EVERY DAY Patient taking differently: Take 12.5 mg by mouth daily. 09/25/20  Yes Gifford Shave, MD  JARDIANCE 10 MG TABS tablet TAKE 1 TABLET BY MOUTH EVERY DAY Patient taking differently: Take 10 mg by mouth daily. 12/05/20  Yes Gifford Shave, MD  LANTUS SOLOSTAR 100 UNIT/ML Solostar Pen INJECT 24 UNITS INTO THE SKIN EVERY MORNING. Patient taking differently: Inject 24 Units into the skin daily. 12/31/20  Yes Gifford Shave, MD  losartan (COZAAR) 50 MG tablet Take 50 mg by mouth in the morning. 05/24/19  Yes [provider]  metFORMIN (GLUCOPHAGE-XR) 500 MG 24 hr tablet TAKE 1 TABLET BY MOUTH TWICE A DAY WITH BREAKFAST AND DINNER Patient taking differently: Take 500 mg by mouth 2 (two) times daily. 03/25/21  Yes Gifford Shave, MD  nitroGLYCERIN (NITROSTAT) 0.4 MG SL tablet Place 1 tablet (0.4 mg total) under the tongue every 5 (five) minutes as needed for chest pain. Patient  taking differently: Place 0.4 mg under the tongue every 5 (five) minutes x 3 doses as needed for chest pain. 07/12/19  Yes Hensel, Jamal Collin, MD  omeprazole (PRILOSEC) 40 MG capsule Take 40 mg by mouth every morning. 12/12/20  Yes [provider]  Blood Glucose Monitoring Suppl MISC One touch ultra glucose monitor. Check blood sugar twice a day.    [provider]  glucose blood (ONE TOUCH ULTRA TEST) test strip CHECK BLOOD SUGAR TWICE DAILY 09/24/18   Everrett Coombe, MD  Insulin Pen Needle (B-D UF III MINI PEN NEEDLES) 31G X 5 MM MISC INJECT INSULIN VIA PEN 6 TIMES DAILY 02/22/18   Everrett Coombe, MD  OneTouch Delica Lancets 99991111 MISC 1 Container by Does not apply route as needed. 06/07/19  Zenia Resides, MD  rosuvastatin (CRESTOR) 20 MG tablet Take 1 tablet (20 mg total) by mouth at bedtime. 05/24/21   Lurline Del, DO    Scheduled Meds:  apixaban  10 mg Oral BID   Followed by   Derrill Memo ON 05/31/2021] apixaban  5 mg Oral BID   chlorhexidine  15 mL Mouth Rinse BID   Chlorhexidine Gluconate Cloth  6 each Topical Daily   feeding supplement  237 mL Oral BID BM   Gerhardt's butt cream  1 application Topical QID   insulin aspart  0-6 Units Subcutaneous Q4H   lidocaine  1 patch Transdermal Q24H   mouth rinse  15 mL Mouth Rinse q12n4p   metoprolol tartrate  25 mg Oral BID   pantoprazole  40 mg Oral Daily   sacubitril-valsartan  1 tablet Oral BID   sodium chloride flush  10-40 mL Intracatheter Q12H   Infusions:  sodium chloride 10 mL/hr at [id number]   TPN CYCLIC-ADULT (ION)     PRN Meds: sodium chloride, acetaminophen, ramelteon, sodium chloride, sodium chloride flush   Allergies as of 05/04/2021   (No Known Allergies)    Family History  Problem Relation Age of Onset   Heart disease Mother        age 33   Heart disease Father    Cancer Father        unknown   Heart disease Brother    Hypertension Brother    Diabetes Daughter    Stroke Daughter    Cirrhosis  Brother    Colon cancer Neg Hx    Colon polyps Neg Hx    Esophageal cancer Neg Hx    Stomach cancer Neg Hx    Rectal cancer Neg Hx     Social History   Socioeconomic History   Marital status: Divorced    Spouse name: Not on file   Number of children: 5   Years of education: 10   Highest education level: 10th grade  Occupational History   Occupation: Retired- IT trainer  Tobacco Use   Smoking status: Never   Smokeless tobacco: Never   Tobacco comments:    No plans to start  Vaping Use   Vaping Use: Never used  Substance and Sexual Activity   Alcohol use: No   Drug use: No   Sexual activity: Not Currently    Birth control/protection: Condom  Other Topics Concern   Not on file  Social History Narrative   Current Social History 07/15/2018     Lives alone, divorced. Lives in one level house, has three stairs in front, with handrail. No grab bars in bathroom. Does have throw rugs on floor. Has smoke alarms, adequate lighting. Has family in [place].  Does light house cleaning part time.     Transportation: Patient has own vehicle    Religious / Personal Beliefs: Browns: info given   End of Life Plan: gave pt living will referral info   Emergency Contact: daughter, Solmon Ice [phone]   Any pets: none   Diet: Pt has a varied diet of protein, starch, and vegetables and fruits.   Seatbelts: Pt reports wearing seatbelt when in vehicles.    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 ENT:  No nose bleeds Pulm: No difficulty breathing.  No cough. CV:  No palpitations, no LE edema.  No angina. GU:  No hematuria, no frequency GI: See HPI. Heme: Denies unusual bleeding or excessive bruising. Transfusions: No previous blood transfusions.  3 units thus far this admission. Neuro:  No headaches, no peripheral tingling or numbness.  No syncope, no seizures. Derm:  No itching, no rash or sores.  Endocrine:  No sweats or chills.  No polyuria or dysuria Immunization: Reviewed.  Fully vaccinated and boosted for COVID-19. Travel:  None beyond local counties in last few months.    PHYSICAL EXAM: Vital signs in last 24 hours: Vitals:   05/29/21 1200 05/29/21 1400  BP: 121/73 122/75  Pulse: (!) 111 (!) 106  Resp: 14 (!) 25  Temp:    SpO2: 90% 93%   Wt Readings from Last 3 Encounters:  05/29/21 69.6 kg  04/26/21 58.1 kg  03/29/21 58.2 kg    General: Looks tired, mildly ill but not toxic.  Alert. Head: No facial asymmetry or swelling.  No signs of head trauma. Eyes: No scleral icterus.  No conjunctival pallor.  EOMI Ears: Not hard of hearing Nose: No discharge or congestion Mouth: Clear, moist, pink oral mucosa.  Tongue midline. Neck: No JVD, no masses, no thyromegaly. Lungs: Intact, healing posterior right thoracotomy scar.  Stable diminished right-sided breath sounds.  No dyspnea or cough. Heart: RRR.  No MRG.  S1, S2 present. Abdomen: Not distended or tender.  Active bowel sounds.  No HSM, masses, bruits, hernias.  Scars from recent laparoscopic cholecystectomy all CDI.Marland Kitchen   Rectal: Deferred. Musc/Skeltl: No joint redness, swelling or gross deformity. Extremities: 2+ pitting edema in the feet bilaterally. Neurologic: Alert.  Appropriate.  Oriented x3.  Moves all 4 limbs without gross tremor or weakness but formal strength testing not performed. Skin: No rash, no sores, no suspicious lesions. Psych: Pleasant but flat affect.  Cooperative.  Intake/Output  from previous day: 08/16 0701 - 08/17 0700 In: 360 [P.O.:360] Out: 1500 [Urine:1500] Intake/Output this shift: Total I/O In: 240 [P.O.:240] Out: 525 [Urine:525]  LAB RESULTS: Recent Labs    05/27/21 0410  WBC 9.3  HGB 10.1*  HCT 31.4*  PLT 323   BMET Lab Results  Component Value Date   NA 137 05/29/2021   NA 134 (L) 05/28/2021   NA 136 05/27/2021   K 4.3 05/29/2021   K 4.2 05/28/2021   K 3.9 05/27/2021   CL 103 05/29/2021   CL 100 05/28/2021   CL 102 05/27/2021   CO2 29 05/29/2021   CO2 27 05/28/2021   CO2 28 05/27/2021   GLUCOSE 79 05/29/2021   GLUCOSE 99 05/28/2021   GLUCOSE 68 (L) 05/27/2021   BUN 27 (H) 05/29/2021   BUN 30 (H) 05/28/2021   BUN 35 (H) 05/27/2021   CREATININE 0.56 05/29/2021   CREATININE 0.53 05/28/2021   CREATININE 0.64 05/27/2021   CALCIUM 8.7 (L) 05/29/2021   CALCIUM 8.7 (L) 05/28/2021   CALCIUM 8.3 (L) 05/27/2021   LFT No results for input(s): PROT, ALBUMIN, AST, ALT, ALKPHOS, BILITOT, BILIDIR, IBILI in the last 72 hours. PT/INR Lab Results  Component Value Date   INR 1.9 (H) 05/05/2021   INR 1.9 (H) 05/05/2021   Hepatitis Panel No results for input(s): HEPBSAG, HCVAB, HEPAIGM, HEPBIGM in the last 72 hours. C-Diff No components found for: CDIFF Lipase     Component Value Date/Time   LIPASE 34 01/21/2021 1115    Drugs of Abuse  No results found for: LABOPIA, COCAINSCRNUR, LABBENZ, AMPHETMU, THCU, LABBARB   RADIOLOGY STUDIES: No results found.    IMPRESSION:   Intolerance to solids and some liquids following repair of esophageal perforation on 7/24.  No demonstrable leak on 8/1 esophagram.  Esophagram demonstrated smooth outpouching below site of perforation, possibly related to dysmotility or atypical diverticulum.  Precipitant for the esophageal perforation is unclear as pt denies nausea and vomiting after her laparoscopic cholecystectomy on 7/21 and generally had no issues with swallowing leading up to this  admission.  Severe malnutrition per RD.  Albumin 1.9, prealbumin 16.  RD recommending placement of a feeding GJ tube in order to maximize nutrition, wean off TNA and allow patient to discharge to SNF for rehab.     Atrial fibrillation.  Resolved and now in sinus rhythm.  On Lopressor and apixaban.  Normocytic anemia.  Required 3 PRBCs a few weeks ago.  Hgb stable at 10.1.    PLAN:       Dr Candis Schatz to see pt.      Azucena Freed  05/29/2021, 3:44 PM Phone [phone]   Attending Physician's Attestation   I have taken an interval history, reviewed the chart and examined the patient.   80 y/o fm with history of esophageal perforation, possibly related to surgical complication of cholecystectomy s/p primary repair who has had a prolonged recovery, now limited by poor nutrition and poor PO intake.  Nutrition has recommended feeding tube placement to achieve adequate nutrition.  After speaking with the patient, I agree with the recommendation for a feeding tube.  I don't know that a GJ tube would provide additional benefit necessarily over a G-tube (no known history of gastroparesis) or GOO but I would defer to IR.  A gastrostomy tube is generally less prone to complications and problems such as clogging, kinking, coiling compared to a GJ.  If IR would like an upper endoscopy prior to feeding tube placement, we can do that. GI will sign off for now.    I agree with the Advanced Practitioner's note and  impression   Dustin Flock, MD [facility]

## 2021-05-29 NOTE — Progress Notes (Addendum)
Family Medicine Teaching Service Daily Progress Note Intern Pager: [phone]  Patient name: Dana Coleman Medical record number: [id number] Date of birth: 02/09/1941 Age: 80 y.o. Gender: female  Primary Care Provider: Gifford Shave, MD Consultants: CVTS, Pulmonology, Cardiology Code Status: Partial code  Pt Overview and Major Events to Date:  Medically stable for discharge to SNF as she was too weak for CIR.   LOS: 25 days   7/23 admitted to ICU, intubated, right pigtail catheter placed 7/24- CRRT, emergent right thoracotomy, repair of esophageal perforation with intercostal pedicled muscle flap and drainage of empyema and decortication of right lung 7/25- NSTEMI, no intervention 7/26- extubated, TPN started 8/1- esophagram without leak, PO started, heparin gtt d/c because of increased bleeding from chest tubes. Transfused 1u pRBC 8/3: anterior chest tube removed 8/4: remaining chest tube removed 8/8: increased O2 requirement, Afib RVR -> PE with right heart strain 8/9: further increased O2 requirement, Afib 8/13: started Entresto, decreasing supplemental oxygen requirement   Abx timeline Zosyn: 7/23-7/26 Eraxis: 7/23-8/12 Unasyn: 7/26-7/31 Meropenem: 7/31-8/3 Ceftriaxone: 8/3-8/11 Metronidazole: 8/3-8/11   Micro Timeline 7/23: MRSA PCR negative 7/23: pleural fluid grew strep salivarius pan-sensitive, strep mitas (sensitive CTX), rare candida albicans 7/24: blood cultures negative x2    Assessment and Plan: Sumire W Draine is a 80 y.o. female admitted for empyema followed by esophageal perforation now s/p repair of esophagus with new atrial fibrillation, s/p NSTEMI and new oxygen requirement in setting of new onsent HF and recent PE, overall improving with no oxygen requirement as of last night.  Esophageal Repair for esophageal perforation, s/p empyema Patient POD #24. Pain well-controlled with scheduled Tylenol. CVTS completely removed stapes on right side chest.  No sign of infection. - CVTS following - tylenol scheduled q4h - completed abx and antifungal TX - ORA, sats > 92%   Acute Hypoxic Respiratory failure 2/2 to PE and new onset HF  Continues to improve. On room air, maintaining sats >92%.. Does not appear volume overloaded in any capacity with no JVD/ lower extremity edema/crackles. She was not motivated with PT this morning, and felt restless. Her major complaint is feeling very tired, so we will limit vital sign checks/ change timing of medication administration to 8am/8pm. - monitor respiratory status - monitor urine output - continue entresto - PT/OT- goal this week is to work on gait   Atrial Fibrillation  Stable.HR 90s bpm overnight. Had en episode of vomiting this morning when trying to take crushed eliquis in applesauce, so we will retry dose of eliquis later today. - continue metoprolol BID - continue eliquis BID    Malnutrition, Protein calorie  Not improving. Had an episode of vomiting this morning after trying to take crushed meds with applesauce. Goal is > 90% PO. 600 kcal intake for past 24 hours, with an estimated need of 1750-2030 kcals.Patient still not having interest in eating.Talked with RD about potential need for J-tube or G-tube for long-term nutrition plan because of her severe weakness and poor caloric intake.  - continue to encourage increased PO feeding - Continue Boost TID between meals - Continue Magic cup BID with meals - Continue feeding assistance with all meals given UE weakness  - monitor CBG, particularly in the mid-day as these glucose #s have been lower - calorie count to have objective data on PO intake - continue dysphagia diet - continue TPN until diet advanced and meeting 50% of meals consistently   T2DM  CBG ranges over past 24 hours: 80s-130s. No episodes of  hypoglycemia. - follow CBGs, goal 140-180 - monitor for hypoglycemia   - continue SSI very sensitive  FEN/GI: Dysphagia 1 PPx: Eliquis  (h/o PE) Dispo:SNF pending placement. Barriers include bed availability/insurance auth.   Subjective:  [name] says she feels "miserable because I can't get any rest." Otherwise, she has no pain. No shortness of breath, dyspnea. She is urinating and stooling normally. Patient tells me she ate carrots, ice cream yesterday. RN says she continues to have poor oral intake.  Objective: Temp:  [98.3 F (36.8 C)-98.8 F (37.1 C)] 98.7 F (37.1 C) (08/17 0813) Pulse Rate:  [84-110] 110 (08/17 0852) Resp:  [19-23] 23 (08/17 0852) BP: (118-137)/(69-79) 128/73 (08/17 0852) SpO2:  [94 %-100 %] 96 % (08/17 0852) Weight:  [69.6 kg] 69.6 kg (08/17 0344) Physical Exam: General: Pleasant, well-appearing, in no distress, laying in bed with fan blowing on her. Smiles and converses with me Cardiovascular: RRR Respiratory: Continued, but stable diminished breath sounds on right side. No shortness of breath or respiratory distress. On room air. Abdomen: Soft, non-tender, non-distended Extremities: BL lower extremities without edema. Warm, dry, well-perfused  Laboratory: Recent Labs  Lab 05/23/21 0434 05/24/21 0500 05/27/21 0410  WBC 11.0* 11.1* 9.3  HGB 9.6* 9.8* 10.1*  HCT 29.3* 29.9* 31.4*  PLT 357 377 323   Recent Labs  Lab 05/23/21 0434 05/24/21 0500 05/25/21 0213 05/26/21 0055 05/27/21 0410 05/28/21 0530 05/29/21 0500  NA 134* 136 135   < > 136 134* 137  K 3.5 3.8 4.0   < > 3.9 4.2 4.3  CL 103 106 106   < > 102 100 103  CO2 23 22 20*   < > '28 27 29  '$ BUN 34* 36* 39*   < > 35* 30* 27*  CREATININE 0.75 0.68 0.78   < > 0.64 0.53 0.56  CALCIUM 8.3* 8.6* 8.5*   < > 8.3* 8.7* 8.7*  PROT 5.8* 5.9* 6.0*  --   --   --   --   BILITOT 0.4 0.4 0.4  --   --   --   --   ALKPHOS 65 62 59  --   --   --   --   ALT '30 27 26  '$ --   --   --   --   AST '28 28 28  '$ --   --   --   --   GLUCOSE 144* 108* 115*   < > 68* 99 79   < > = values in this interval not displayed.    Imaging/Diagnostic  Tests: No results found.   Orvis Brill, DO 05/29/2021, 9:05 AM PGY-1, [place] Intern pager: [phone], text pages welcome   I have interviewed and examined the patient.  I have discussed the case with Dr. Owens Shark.   I agree with their documentation and management in their note for today.    LOS: 25 days    Awaiting SNF.  Patient medically stable for discharge to SNF.               For questions or updates, please contact Family Medicine Teaching Service Resident On-call at pager [phone] or  [email or web address] - see pager "[place]"

## 2021-05-29 NOTE — Progress Notes (Addendum)
Calorie Count Note  48 hour calorie count ordered.  Diet: dysphagia 1, thin liquids  Supplements: Ensure Enlive BID, Magic Cup BID  Day 2 results   Lunch: 0 kcal, 0 g protein  Dinner: 100 kcal, 10 g protein Breakfast: 100 kcal, 1 g protein Supplements: 450 kcal, 25 g protein  Total intake: 650 kcal (37% of minimum estimated needs)  36 protein (40% of minimum estimated needs)  Estimated Nutritional Needs:  Kcal:  1750-2030 kcals Protein:  90-120 grams Fluid:  >/= 1.8 L  Nutrition Dx: Severe Malnutrition related to acute illness as evidenced by moderate fat depletion, mild fat depletion, moderate muscle depletion- Ongoing  Goal: Patient will meet greater than or equal to 90% of their needs- not meeting by mouth  Intervention:   Of note, patient had large vomiting episode this am. Unable to eat lunch after. Given TPN is not a long term option, recommend G-J tube placement for primary source of nutrition as intake has been poor since admit, upper extremity mobility inhibits self feeding, and patient is severely malnourished. Discussed with family medicine.   Continue TPN to meet 100% estimated nutritional needs (until eating 50% of meals consistently. Continue Ensure Enlive po BID, each supplement provides 350 kcal and 20 grams of protein Continue Magic cup BID with meals, each supplement provides 290 kcal and 9 grams of protein Continue feeding assistance with all meals given UE weakness   Jowana Thumma MS, RD, LDN, CNSC Clinical Nutrition Pager listed in [place]

## 2021-05-29 NOTE — Progress Notes (Signed)
FPTS Brief Progress Note  S Saw patient at bedside this evening. Patient was sleeping comfortably. I did not wake the patient. Per RN pt was able to tolerate her medications this evening with apple sauce with no further episodes of vomiting.   O: BP 133/84 (BP Location: Right Arm)   Pulse (!) 111   Temp 98 F (36.7 C) (Oral)   Resp (!) 25   Ht '5\' 1"'$  (1.549 m)   Wt 69.6 kg   SpO2 98%   BMI 28.99 kg/m    General: no acute distress, sleeping comfortably  A/P: Plan per day team  -Am IR consult for gastrotomy tube  - Orders reviewed. Labs for AM ordered, which was adjusted as needed.   Lattie Haw, MD 05/29/2021, 10:49 PM PGY-3, [place] [facility] Night Resident  Please page [phone] with questions.

## 2021-05-29 NOTE — Progress Notes (Signed)
PHARMACY - TOTAL PARENTERAL NUTRITION CONSULT NOTE  Indication: s/p esophageal perforation and repair  Patient Measurements: Height: '5\' 1"'$  (154.9 cm) Weight: 69.6 kg (153 lb 7 oz) IBW/kg (Calculated) : 47.8   Body mass index is 28.99 kg/m.  Assessment:  80 y/o female with recent lap chole for symptomatic cholelithiasis on 7/21. PMH including HTN, HLD, DM, R breast cancer, and CAD. Patient presented on 7/23 with AMS and unresponsiveness - was complaining of abdominal discomfort and then patient became unresponsive and slumped on table.  S/p gall bladder surgery and esophageal perforation-repaired 7/24.  Pharmacy consulted for TPN management.  Glucose / Insulin: DM with A1c 7.4% on Lantus 24/d PTA.  CBGs 79-150s, (running lower mid-day when TPN is off)  Utilized 1 units sens SSI + 75 units insulin in TPN - decreased 8/15  Electrolytes: K 4.3, Na 134>137, CorCa wnl Renal: septic ATN s/p CRRT (end 7/25) - SCr < 1 and stable, BUN at 27 Hepatic: LFTs significantly increased 8/4 (decreased with holding TPN for 6 hours and Lasix), tbili WNL, albumin down 1.9, prealbumin up 8.9, TG WNL Intake / Output; MIVF:  UOP 0.9 ml/kg/hr, LBM 8/16  GI Imaging: - 7/23 CT: no acute abnormalities  GI Surgeries / Procedures:  - 7/21: s/p laparoscopic cholecystectomy and LOA - 7/24: s/p R thoracotomy, repair of esophageal perf with muscle flap, drainage of empyema and decortication of R lung  Central access: PICC 7/26 (double lumen) TPN start date: 05/07/21  Nutritional Goals (per RD rec on 8/9): kCal: 1750-2030 , Protein: 90-120 , Fluid: >=1.8L  Current Nutrition:  TPN Ensure Enlive BID - not really taking in last 24h 8/12 Dysphagia 1 diet, little intake No feeding tube recommended due to esophageal injury  Plan:  Continue concentrated cyclic TPN over 16 hr at 1575 mL over 16 hrs: 53 mL/hr x 1 hr, then 105 mL/hr x 14 hrs, then 53 mL/hr x 1 hr. TPN provides 111g AA, 230g CHO, and 58g ILE for total  of 1803 kCal, meeting 100% of patient's needs. Electrolytes in TPN: Na 30 mEq/L (= 47 mEq), K 30 mEq/L (= 47 mEq), Ca 58mq/L since 7/30, Mg 11 mEq/L (= 17 mEq), Phos 30 mmol/L (= 47 mmol), maximum acetate Add standard MVI and trace elements to TPN Continue sens SSI Q4H and continue insulin in TPN at 75 units. Watch mid-day lower CBGs and encourage po intake TPN labs in AM  JBertis Ruddy PharmD Clinical Pharmacist Please check AMION for all [place] 05/29/2021 7:45 AM

## 2021-05-29 NOTE — Discharge Instructions (Addendum)
Information on my medicine - ELIQUIS (apixaban)  This medication education was reviewed with me or my healthcare representative as part of my discharge preparation.    Why was Eliquis prescribed for you? Eliquis was prescribed to treat blood clots that may have been found in the veins of your legs (deep vein thrombosis) or in your lungs (pulmonary embolism) and to reduce the risk of them occurring again.  What do You need to know about Eliquis ? The starting dose is 10 mg (two 5 mg tablets) taken TWICE daily for the FIRST SEVEN (7) DAYS (which has already been completed in the hospital). At discharge, continue taking ONE 5 mg tablet taken TWICE daily.  Eliquis may be taken with or without food.   Try to take the dose about the same time in the morning and in the evening. If you have difficulty swallowing the tablet whole please discuss with your pharmacist how to take the medication safely.  Take Eliquis exactly as prescribed and DO NOT stop taking Eliquis without talking to the doctor who prescribed the medication.  Stopping may increase your risk of developing a new blood clot.  Refill your prescription before you run out.  After discharge, you should have regular check-up appointments with your healthcare provider that is prescribing your Eliquis.    What do you do if you miss a dose? If a dose of ELIQUIS is not taken at the scheduled time, take it as soon as possible on the same day and twice-daily administration should be resumed. The dose should not be doubled to make up for a missed dose.  Important Safety Information A possible side effect of Eliquis is bleeding. You should call your healthcare provider right away if you experience any of the following: Bleeding from an injury or your nose that does not stop. Unusual colored urine (red or dark brown) or unusual colored stools (red or black). Unusual bruising for unknown reasons. A serious fall or if you hit your head (even  if there is no bleeding).  Some medicines may interact with Eliquis and might increase your risk of bleeding or clotting while on Eliquis. To help avoid this, consult your healthcare provider or pharmacist prior to using any new prescription or non-prescription medications, including herbals, vitamins, non-steroidal anti-inflammatory drugs (NSAIDs) and supplements.  This website has more information on Eliquis (apixaban): http://www.eliquis.com/eliquis/home   =========================================  Pulmonary Embolism    A pulmonary embolism (PE) is a sudden blockage or decrease of blood flow in one or both lungs. Most blockages come from a blood clot that forms in the vein of a lower leg, thigh, or arm (deep vein thrombosis, DVT) and travels to the lungs. A clot is blood that has thickened into a gel or solid. PE is a dangerous and life-threatening condition that needs to be treated right away.  What are the causes? This condition is usually caused by a blood clot that forms in a vein and moves to the lungs. In rare cases, it may be caused by air, fat, part of a tumor, or other tissue that moves through the veins and into the lungs.  What increases the risk? The following factors may make you more likely to develop this condition: Experiencing a traumatic injury, such as breaking a hip or leg. Having: A spinal cord injury. Orthopedic surgery, especially hip or knee replacement. Any major surgery. A stroke. DVT. Blood clots or blood clotting disease. Long-term (chronic) lung or heart disease. Cancer treated with chemotherapy. A  central venous catheter. Taking medicines that contain estrogen. These include birth control pills and hormone replacement therapy. Being: Pregnant. In the period of time after your baby is delivered (postpartum). Older than age 65. Overweight. A smoker, especially if you have other risks.  What are the signs or symptoms? Symptoms of this condition  usually start suddenly and include: Shortness of breath during activity or at rest. Coughing, coughing up blood, or coughing up blood-tinged mucus. Chest pain that is often worse with deep breaths. Rapid or irregular heartbeat. Feeling light-headed or dizzy. Fainting. Feeling anxious. Fever. Sweating. Pain and swelling in a leg. This is a symptom of DVT, which can lead to PE. How is this diagnosed? This condition may be diagnosed based on: Your medical history. A physical exam. Blood tests. CT pulmonary angiogram. This test checks blood flow in and around your lungs. Ventilation-perfusion scan, also called a lung VQ scan. This test measures air flow and blood flow to the lungs. An ultrasound of the legs.  How is this treated? Treatment for this condition depends on many factors, such as the cause of your PE, your risk for bleeding or developing more clots, and other medical conditions you have. Treatment aims to remove, dissolve, or stop blood clots from forming or growing larger. Treatment may include: Medicines, such as: Blood thinning medicines (anticoagulants) to stop clots from forming. Medicines that dissolve clots (thrombolytics). Procedures, such as: Using a flexible tube to remove a blood clot (embolectomy) or to deliver medicine to destroy it (catheter-directed thrombolysis). Inserting a filter into a large vein that carries blood to the heart (inferior vena cava). This filter (vena cava filter) catches blood clots before they reach the lungs. Surgery to remove the clot (surgical embolectomy). This is rare. You may need a combination of immediate, long-term (up to 3 months after diagnosis), and extended (more than 3 months after diagnosis) treatments. Your treatment may continue for several months (maintenance therapy). You and your health care provider will work together to choose the treatment program that is best for you.  Follow these instructions at  home: Medicines Take over-the-counter and prescription medicines only as told by your health care provider. If you are taking an anticoagulant medicine: Take the medicine every day at the same time each day. Understand what foods and drugs interact with your medicine. Understand the side effects of this medicine, including excessive bruising or bleeding. Ask your health care provider or pharmacist about other side effects.  General instructions Wear a medical alert bracelet or carry a medical alert card that says you have had a PE and lists what medicines you take. Ask your health care provider when you may return to your normal activities. Avoid sitting or lying for a long time without moving. Maintain a healthy weight. Ask your health care provider what weight is healthy for you. Do not use any products that contain nicotine or tobacco, such as cigarettes, e-cigarettes, and chewing tobacco. If you need help quitting, ask your health care provider. Talk with your health care provider about any travel plans. It is important to make sure that you are still able to take your medicine while on trips. Keep all follow-up visits as told by your health care provider. This is important.  Contact a health care provider if: You missed a dose of your blood thinner medicine.  Get help right away if: You have: New or increased pain, swelling, warmth, or redness in an arm or leg. Numbness or tingling in an arm  or leg. Shortness of breath during activity or at rest. A fever. Chest pain. A rapid or irregular heartbeat. A severe headache. Vision changes. A serious fall or accident, or you hit your head. Stomach (abdominal) pain. Blood in your vomit, stool, or urine. A cut that will not stop bleeding. You cough up blood. You feel light-headed or dizzy. You cannot move your arms or legs. You are confused or have memory loss.  These symptoms may represent a serious problem that is an emergency. Do  not wait to see if the symptoms will go away. Get medical help right away. Call your local emergency services (911 in the [place] Do not drive yourself to the hospital. Summary A pulmonary embolism (PE) is a sudden blockage or decrease of blood flow in one or both lungs. PE is a dangerous and life-threatening condition that needs to be treated right away. Treatments for this condition usually include medicines to thin your blood (anticoagulants) or medicines to break apart blood clots (thrombolytics). If you are given blood thinners, it is important to take the medicine every day at the same time each day. Understand what foods and drugs interact with any medicines that you are taking. If you have signs of PE or DVT, call your local emergency services (911 in the [place] This information is not intended to replace advice given to you by your health care provider. Make sure you discuss any questions you have with your health care provider. Document Revised: 07/07/2018 Document Reviewed: 07/07/2018 Elsevier Patient Education  2020 [employer].   ====================================.Marland Kitchen  Atrial Fibrillation    Atrial fibrillation is a type of heartbeat that is irregular or fast. If you have this condition, your heart beats without any order. This makes it hard for your heart to pump blood in a normal way. Atrial fibrillation may come and go, or it may become a long-lasting problem. If this condition is not treated, it can put you at higher risk for stroke, heart failure, and other heart problems.  What are the causes? This condition may be caused by diseases that damage the heart. They include: High blood pressure. Heart failure. Heart valve disease. Heart surgery. Other causes include: Diabetes. Thyroid disease. Being overweight. Kidney disease. Sometimes the cause is not known.  What increases the risk? You are more likely to develop this condition if: You are older. You smoke. You  exercise often and very hard. You have a family history of this condition. You are a man. You use drugs. You drink a lot of alcohol. You have lung conditions, such as emphysema, pneumonia, or COPD. You have sleep apnea.  What are the signs or symptoms? Common symptoms of this condition include: A feeling that your heart is beating very fast. Chest pain or discomfort. Feeling short of breath. Suddenly feeling light-headed or weak. Getting tired easily during activity. Fainting. Sweating. In some cases, there are no symptoms.  How is this treated? Treatment for this condition depends on underlying conditions and how you feel when you have atrial fibrillation. They include: Medicines to: Prevent blood clots. Treat heart rate or heart rhythm problems. Using devices, such as a pacemaker, to correct heart rhythm problems. Doing surgery to remove the part of the heart that sends bad signals. Closing an area where clots can form in the heart (left atrial appendage). In some cases, your doctor will treat other underlying conditions.  Follow these instructions at home:  Medicines Take over-the-counter and prescription medicines only as told by your  doctor. Do not take any new medicines without first talking to your doctor. If you are taking blood thinners: Talk with your doctor before you take any medicines that have aspirin or NSAIDs, such as ibuprofen, in them. Take your medicine exactly as told by your doctor. Take it at the same time each day. Avoid activities that could hurt or bruise you. Follow instructions about how to prevent falls. Wear a bracelet that says you are taking blood thinners. Or, carry a card that lists what medicines you take. Lifestyle         Do not use any products that have nicotine or tobacco in them. These include cigarettes, e-cigarettes, and chewing tobacco. If you need help quitting, ask your doctor. Eat heart-healthy foods. Talk with your doctor  about the right eating plan for you. Exercise regularly as told by your doctor. Do not drink alcohol. Lose weight if you are overweight. Do not use drugs, including cannabis.  General instructions If you have a condition that causes breathing to stop for a short period of time (apnea), treat it as told by your doctor. Keep a healthy weight. Do not use diet pills unless your doctor says they are safe for you. Diet pills may make heart problems worse. Keep all follow-up visits as told by your doctor. This is important.  Contact a doctor if: You notice a change in the speed, rhythm, or strength of your heartbeat. You are taking a blood-thinning medicine and you get more bruising. You get tired more easily when you move or exercise. You have a sudden change in weight.  Get help right away if:    You have pain in your chest or your belly (abdomen). You have trouble breathing. You have side effects of blood thinners, such as blood in your vomit, poop (stool), or pee (urine), or bleeding that cannot stop. You have any signs of a stroke. "BE FAST" is an easy way to remember the main warning signs: B - Balance. Signs are dizziness, sudden trouble walking, or loss of balance. E - Eyes. Signs are trouble seeing or a change in how you see. F - Face. Signs are sudden weakness or loss of feeling in the face, or the face or eyelid drooping on one side. A - Arms. Signs are weakness or loss of feeling in an arm. This happens suddenly and usually on one side of the body. S - Speech. Signs are sudden trouble speaking, slurred speech, or trouble understanding what people say. T - Time. Time to call emergency services. Write down what time symptoms started. You have other signs of a stroke, such as: A sudden, very bad headache with no known cause. Feeling like you may vomit (nausea). Vomiting. A seizure.  These symptoms may be an emergency. Do not wait to see if the symptoms will go away. Get medical  help right away. Call your local emergency services (911 in the [place] Do not drive yourself to the hospital. Summary Atrial fibrillation is a type of heartbeat that is irregular or fast. You are at higher risk of this condition if you smoke, are older, have diabetes, or are overweight. Follow your doctor's instructions about medicines, diet, exercise, and follow-up visits. Get help right away if you have signs or symptoms of a stroke. Get help right away if you cannot catch your breath, or you have chest pain or discomfort. This information is not intended to replace advice given to you by your health care provider. Make sure  you discuss any questions you have with your health care provider. Document Revised: 03/23/2019 Document Reviewed: 03/23/2019 Elsevier Patient Education  [employer].

## 2021-05-29 NOTE — Progress Notes (Signed)
SLP Cancellation Note  Patient Details Name: CHARLEA SICA MRN: [id number] DOB: 01/08/1941   Cancelled treatment:       Reason Eval/Treat Not Completed: Medical issues which prohibited therapy. Per RN and NT, pt with vomiting this morning. Will hold PO trials at this time, but will f/u as able.     Osie Bond., M.A. [facility] Pager [phone] Office [phone]  05/29/2021, 9:12 AM

## 2021-05-29 NOTE — Progress Notes (Signed)
Physical Therapy Treatment Patient Details Name: Dana Coleman MRN: [id number] DOB: 03-Mar-1941 Today's Date: 05/29/2021    History of Present Illness Pt is a 80 yo female admitted on 05/04/21 for AMS. HX of cholecystectomy on 7/21.  Intubated upon admission, 7/24 Started CRRT, Esophageal perforation with septic shock post flap repair, drainage of empyema and decortication of R lung with chest tube placed.  7/25 EKG changes and probable nstemi. Extubated 7/26. CT removed 8/4. Pt with new PE with R heart strain and increased O2 HFNC requirements 8/8. PMH includes: R breast CA, DM, CAD, HTN.    PT Comments    Patient received in bed, reports she is feeling restless, HR in 120s per monitor. Patient does not want to get oob at this time. Agreeable to bed exercises but then has limited participation with this. Grimacing throughout and requiring assist to continue. Patient will continue to benefit from skilled PT while here to improve independence and strength.      Follow Up Recommendations  SNF     Equipment Recommendations  Rolling walker with 5" wheels;Wheelchair (measurements PT);Wheelchair cushion (measurements PT)    Recommendations for Other Services       Precautions / Restrictions Precautions Precautions: Fall Precaution Comments: watch respiratory status, HR in 120s in bed this session    Mobility  Bed Mobility               General bed mobility comments: Patient declines oob activity this morning.    Transfers                 General transfer comment: patient declines  Ambulation/Gait             General Gait Details: patient declines oob activity this morning   Stairs             Wheelchair Mobility    Modified Rankin (Stroke Patients Only)       Balance                                            Cognition Arousal/Alertness: Lethargic Behavior During Therapy: Flat affect Overall Cognitive Status: No  family/caregiver present to determine baseline cognitive functioning Area of Impairment: Problem solving;Awareness                       Following Commands: Follows one step commands with increased time     Problem Solving: Slow processing;Requires verbal cues;Decreased initiation        Exercises Other Exercises Other Exercises: B LE exercises to include: AP, heel slides, hip abd/add x 5 reps with assist.    General Comments        Pertinent Vitals/Pain Pain Assessment: Faces Faces Pain Scale: Hurts a little bit Pain Location: generalized discomfort/grimacing Pain Descriptors / Indicators: Grimacing Pain Intervention(s): Monitored during session    Home Living                      Prior Function            PT Goals (current goals can now be found in the care plan section) Acute Rehab PT Goals Patient Stated Goal: to rest PT Goal Formulation: With patient Time For Goal Achievement: 06/12/21 Potential to Achieve Goals: Fair Progress towards PT goals: Not progressing toward goals - comment (limited participation this session)  Frequency    Min 3X/week      PT Plan Current plan remains appropriate    Co-evaluation              AM-PAC PT "6 Clicks" Mobility   Outcome Measure  Help needed turning from your back to your side while in a flat bed without using bedrails?: A Little Help needed moving from lying on your back to sitting on the side of a flat bed without using bedrails?: A Little Help needed moving to and from a bed to a chair (including a wheelchair)?: A Little Help needed standing up from a chair using your arms (e.g., wheelchair or bedside chair)?: A Little Help needed to walk in hospital room?: A Lot Help needed climbing 3-5 steps with a railing? : Total 6 Click Score: 15    End of Session   Activity Tolerance: Patient limited by fatigue;Other (comment) (reports she is feeling restless) Patient left: in bed Nurse  Communication: Mobility status PT Visit Diagnosis: Other abnormalities of gait and mobility (R26.89);Muscle weakness (generalized) (M62.81)     Time: 1010-1018 PT Time Calculation (min) (ACUTE ONLY): 8 min  Charges:  $Therapeutic Exercise: 8-22 mins                    Celsa Nordahl, PT, GCS 05/29/21,10:29 AM

## 2021-05-30 DIAGNOSIS — K223 Perforation of esophagus: Secondary | ICD-10-CM | POA: Diagnosis not present

## 2021-05-30 LAB — GLUCOSE, CAPILLARY
Glucose-Capillary: 116 mg/dL — ABNORMAL HIGH (ref 70–99)
Glucose-Capillary: 116 mg/dL — ABNORMAL HIGH (ref 70–99)
Glucose-Capillary: 118 mg/dL — ABNORMAL HIGH (ref 70–99)
Glucose-Capillary: 129 mg/dL — ABNORMAL HIGH (ref 70–99)
Glucose-Capillary: 140 mg/dL — ABNORMAL HIGH (ref 70–99)
Glucose-Capillary: 158 mg/dL — ABNORMAL HIGH (ref 70–99)
Glucose-Capillary: 163 mg/dL — ABNORMAL HIGH (ref 70–99)

## 2021-05-30 LAB — MAGNESIUM: Magnesium: 2.1 mg/dL (ref 1.7–2.4)

## 2021-05-30 LAB — BASIC METABOLIC PANEL
Anion gap: 7 (ref 5–15)
BUN: 28 mg/dL — ABNORMAL HIGH (ref 8–23)
CO2: 27 mmol/L (ref 22–32)
Calcium: 8.6 mg/dL — ABNORMAL LOW (ref 8.9–10.3)
Chloride: 103 mmol/L (ref 98–111)
Creatinine, Ser: 0.62 mg/dL (ref 0.44–1.00)
GFR, Estimated: >60 mL/min (ref >60)
Glucose, Bld: 84 mg/dL (ref 70–99)
Potassium: 4.5 mmol/L (ref 3.5–5.1)
Sodium: 137 mmol/L (ref 135–145)

## 2021-05-30 LAB — HEPARIN LEVEL (UNFRACTIONATED): Heparin Unfractionated: >1.1 IU/mL — ABNORMAL HIGH (ref 0.30–0.70)

## 2021-05-30 LAB — APTT: aPTT: 142 seconds — ABNORMAL HIGH (ref 24–36)

## 2021-05-30 LAB — PHOSPHORUS: Phosphorus: 4 mg/dL (ref 2.5–4.6)

## 2021-05-30 MED ORDER — ONDANSETRON HCL 4 MG/2ML IJ SOLN
4.0000 mg | Freq: Once | INTRAMUSCULAR | Status: AC
  Administered 2021-05-30: 4 mg via INTRAVENOUS
  Filled 2021-05-30: qty 2

## 2021-05-30 MED ORDER — HEPARIN (PORCINE) 25000 UT/250ML-% IV SOLN
1400.0000 [IU]/h | INTRAVENOUS | Status: DC
  Administered 2021-05-30: 1650 [IU]/h via INTRAVENOUS
  Administered 2021-05-31: 1550 [IU]/h via INTRAVENOUS
  Administered 2021-06-01: 1500 [IU]/h via INTRAVENOUS
  Administered 2021-06-01: 1350 [IU]/h via INTRAVENOUS
  Administered 2021-06-02: 1400 [IU]/h via INTRAVENOUS
  Administered 2021-06-03: 1450 [IU]/h via INTRAVENOUS
  Filled 2021-05-30 (×6): qty 250

## 2021-05-30 MED ORDER — TRACE MINERALS CU-MN-SE-ZN 300-55-60-3000 MCG/ML IV SOLN
INTRAVENOUS | Status: AC
  Filled 2021-05-30: qty 745.53

## 2021-05-30 NOTE — Progress Notes (Signed)
PHARMACY - TOTAL PARENTERAL NUTRITION CONSULT NOTE  Indication: s/p esophageal perforation and repair  Patient Measurements: Height: '5\' 1"'$  (154.9 cm) Weight: 69.4 kg (153 lb) IBW/kg (Calculated) : 47.8   Body mass index is 28.91 kg/m.  Assessment:  80 y/o female with recent lap chole for symptomatic cholelithiasis on 7/21. PMH including HTN, HLD, DM, R breast cancer, and CAD. Patient presented on 7/23 with AMS and unresponsiveness - was complaining of abdominal discomfort and then patient became unresponsive and slumped on table.  S/p gall bladder surgery and esophageal perforation-repaired 7/24.  Pharmacy consulted for TPN management.  Glucose / Insulin: DM with A1c 7.4% on Lantus 24/d PTA.  CBGs 84-125, (running low overnight)  Utilized 2 units sens SSI + 75 units insulin in TPN - decreased 8/15 - decrease 8/18 to 70 units in TPN  Electrolytes: K 4.5, Na 134>137, CorCa wnl Renal: septic ATN s/p CRRT (end 7/25) - SCr < 1 and stable, BUN at 28 Hepatic: LFTs significantly increased 8/4 (decreased with holding TPN for 6 hours and Lasix), tbili WNL, albumin down 1.9, prealbumin up 8.9, TG WNL Intake / Output; MIVF:  UOP 0.7 ml/kg/hr, LBM 8/17; Emesis x 1  GI Imaging: - 7/23 CT: no acute abnormalities  GI Surgeries / Procedures:  - 7/21: s/p laparoscopic cholecystectomy and LOA - 7/24: s/p R thoracotomy, repair of esophageal perf with muscle flap, drainage of empyema and decortication of R lung  Central access: PICC 7/26 (double lumen) TPN start date: 05/07/21  Nutritional Goals (per RD rec on 8/9): kCal: 1750-2030 , Protein: 90-120 , Fluid: >=1.8L  Current Nutrition:  TPN Ensure Enlive BID - 1/2 of one - that she then threw up 8/12 Dysphagia 1 diet, little intake No feeding tube recommended due to esophageal injury  Plan:  Continue concentrated cyclic TPN over 16 hr at 1575 mL over 16 hrs: 53 mL/hr x 1 hr, then 105 mL/hr x 14 hrs, then 53 mL/hr x 1 hr. TPN provides 111g AA,  230g CHO, and 58g ILE for total of 1803 kCal, meeting 100% of patient's needs. Electrolytes in TPN: Na 30 mEq/L (= 47 mEq), decr K 25 mEq/L (= 39 mEq), Ca 69mq/L since 7/30, Mg 11 mEq/L (= 17 mEq), Phos 30 mmol/L (= 47 mmol), maximum acetate Add standard MVI and trace elements to TPN Continue sens SSI Q4H and decrease insulin in TPN to 70 units. Watch mid-day lower CBGs and encourage po intake BMP in AM  CAlanda Slim PharmD, FFirsthealth Montgomery Memorial HospitalClinical Pharmacist Please see AMION for all Pharmacists' Contact Phone Numbers 05/30/2021, 7:28 AM

## 2021-05-30 NOTE — Progress Notes (Addendum)
ANTICOAGULATION CONSULT NOTE - Initial Consult  Pharmacy Consult for Heparin Indication: atrial fibrillation and pulmonary embolus  No Known Allergies  Patient Measurements: Height: '5\' 1"'$  (154.9 cm) Weight: 69.4 kg (153 lb) IBW/kg (Calculated) : 47.8 Heparin Dosing Weight: 62.6 kg  Vital Signs: Temp: 98.2 F (36.8 C) (08/18 0800) Temp Source: Oral (08/18 0800) BP: 135/87 (08/18 0800) Pulse Rate: 110 (08/18 0800)  Labs: Recent Labs    05/28/21 0530 05/29/21 0500 05/30/21 0426  CREATININE 0.53 0.56 0.62    Estimated Creatinine Clearance: 50.8 mL/min (by C-G formula based on SCr of 0.62 mg/dL).   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   Hyperlipidemia    Hypertension    Personal history of radiation therapy 2009   Respiratory failure ([place]) 05/04/2021    Medications:  Scheduled:   chlorhexidine  15 mL Mouth Rinse BID   Chlorhexidine Gluconate Cloth  6 each Topical Daily   feeding supplement  237 mL Oral BID BM   Gerhardt's butt cream  1 application Topical QID   insulin aspart  0-6 Units Subcutaneous Q4H   lidocaine  1 patch Transdermal Q24H   mouth rinse  15 mL Mouth Rinse q12n4p   metoprolol tartrate  25 mg Oral BID   pantoprazole  40 mg Oral Daily   sacubitril-valsartan  1 tablet Oral BID   sodium chloride flush  10-40 mL Intracatheter Q12H    Assessment: 80yo female with moderate BL acute PE with RHS 8/8 and atrial fibrillation (no anticoagulation PTA). Patient with complicated hospital course including empyema, acute renal failure, NSTEMI, and PE. Currently on TPN with plans for g-tube.   Earlier this  admission, patient was on heparin (8/8-8/12) and then transitioned to oral anticoagulation with apixaban '5mg'$  PO BID on 8/12.   Patient has not been able to consistently tolerate oral medications, and has had two episodes of vomiting within the past 24 hrs. Last dose of apixaban '5mg'$  was given on 8/18 @ 0906, and RN reported patient vomiting shortly after receiving dose. Pharmacy has been consulted for heparin dosing.   Patient was previously therapeutic at 0.33 on heparin 1550 units/hr, and then therapeutic at 0.68 on 1700 units/hr. Will start heparin based on previous heparin infusion rates and reported heparin levels. No noted evidence of bleeding.   Will continue to monitor aPTT until correlating with heparin levels.   Goal of Therapy:  Heparin level 0.3-0.7 units/ml aPTT 66-102 seconds.  Monitor platelets by anticoagulation protocol: Yes   Plan:  Start heparin infusion at 1650 units/hr Check 8 hr heparin level/aPTT.  Monitor Scr, CBC and for signs/symptoms of bleeding.    Vance Peper, PharmD PGY1 Pharmacy Resident 05/30/2021 2:58 PM   Please check AMION for all [place] phone numbers After 10:00 PM, call [place] [phone]

## 2021-05-30 NOTE — Progress Notes (Addendum)
Family Medicine Teaching Service Daily Progress Note Intern Pager: [phone]  Patient name: Dana Coleman Medical record number: [id number] Date of birth: 1941/03/15 Age: 80 y.o. Gender: female  Primary Care Provider: Gifford Shave, MD Consultants: Gastroenterology, CVTS, Pulmonology, Cardiology Code Status: Partial code  Pt Overview and Major Events to Date:    LOS: 26 days   7/23 admitted to ICU, intubated, right pigtail catheter placed 7/24- CRRT, emergent right thoracotomy, repair of esophageal perforation with intercostal pedicled muscle flap and drainage of empyema and decortication of right lung 7/25- NSTEMI, no intervention 7/26- extubated, TPN started 8/1- esophagram without leak, PO started, heparin gtt d/c because of increased bleeding from chest tubes. Transfused 1u pRBC 8/3: anterior chest tube removed 8/4: remaining chest tube removed 8/8: increased O2 requirement, Afib RVR -> PE with right heart strain 8/9: further increased O2 requirement, Afib 8/13: started Entresto, decreasing supplemental oxygen requirement   Abx timeline Zosyn: 7/23-7/26 Eraxis: 7/23-8/12 Unasyn: 7/26-7/31 Meropenem: 7/31-8/3 Ceftriaxone: 8/3-8/11 Metronidazole: 8/3-8/11   Micro Timeline 7/23: MRSA PCR negative 7/23: pleural fluid grew strep salivarius pan-sensitive, strep mitas (sensitive CTX), rare candida albicans 7/24: blood cultures negative x2  Assessment and Plan: Dana Coleman is a 80 y.o. female admitted for empyema followed by esophageal perforation now s/p repair of esophagus with new atrial fibrillation, s/p NSTEMI and new oxygen requirement in setting of new onsent HF and recent PE, overall improving with no oxygen requirement as of last night.  Malnutrition, Protein calorie  Not improving. Consulted with GI yesterday about appropriateness for G or GJ tube because of severe weakness and poor caloric intake. GI agrees with recommendation and they said G tube is less  prone to complications, but we will defer to IR about best option. Will need to weigh benefits and risks of holding eliquis in setting of PE for surgery to decreased bleeding risk. Patient is agreeable to pursuing G tube. Had 1 episode of vomiting this morning after swishing with Chlorhexidine mouthwash, emesis 145m of applesauce, meds, orange juice. Goal is > 90% PO, only taking around 10-20%. - Consult with IR regarding G tube placement - continue to encourage increased PO feeding - Continue Boost TID between meals - Continue Magic cup BID with meals - Continue feeding assistance with all meals given UE weakness  - monitor CBG, particularly in the mid-day as these glucose #s have been lower - calorie count to have objective data on PO intake - continue dysphagia diet - continue TPN until diet advanced and meeting 50% of meals consistently - protonix '40mg'$  daily   Esophageal Repair for esophageal perforation, s/p empyema Patient POD #24. Pain well-controlled with scheduled Tylenol. No sign of infection. - CVTS following - tylenol PRN q4h - completed abx and antifungal TX   Acute Hypoxic Respiratory failure 2/2 to PE and new onset HF  Continues to improve. On room air, maintaining sats >92%.. Does not appear volume overloaded in any capacity with no JVD/ lower extremity edema/crackles. - monitor respiratory status - ORA, sats > 92% - Start Heparin in lieu of d/c'ed eliquis - monitor urine output - continue entresto - PT/OT- goal this week is to work on gait   Atrial Fibrillation  Stable.HR 90s bpm overnight.  - continue metoprolol BID   T2DM  CBG ranges over past 24 hours: 115-130. No episodes of hypoglycemia. - follow CBGs, goal 140-180 - monitor for hypoglycemia   - continue SSI very sensitive  FEN/GI: Dysphagia 1 PPx: Heparin Dispo:SNF  pending clinical improvement .  Barriers include weakness/malnutrition.   Subjective:  Dana Coleman feels well this morning. No complaints. She  denies nausea, vomiting, chest pain, abdominal pain, shortness of breath. She was pleasant and said she felt more well-rested and was able to sleep last night.  Objective: Temp:  [98 F (36.7 C)-98.9 F (37.2 C)] 98.4 F (36.9 C) (08/18 0417) Pulse Rate:  [88-114] 100 (08/18 0417) Resp:  [14-29] 26 (08/18 0417) BP: (119-137)/(71-84) 126/75 (08/18 0417) SpO2:  [90 %-98 %] 96 % (08/18 0417) Weight:  [69.4 kg] 69.4 kg (08/18 0417) Physical Exam: General: Pleasant, well-appearing, weak, in no distress, laying in bed. Smiles and converses with me Cardiovascular: RRR Respiratory: Continued, but stable diminished breath sounds on right side. On room air with no increased work of breathing. Abdomen: Soft, non-tender, non-distended Extremities: BL lower extremities without edema. Warm, dry, well-perfused  Laboratory: Recent Labs  Lab 05/24/21 0500 05/27/21 0410  WBC 11.1* 9.3  HGB 9.8* 10.1*  HCT 29.9* 31.4*  PLT 377 323   Recent Labs  Lab 05/24/21 0500 05/25/21 0213 05/26/21 0055 05/28/21 0530 05/29/21 0500 05/30/21 0426  NA 136 135   < > 134* 137 137  K 3.8 4.0   < > 4.2 4.3 4.5  CL 106 106   < > 100 103 103  CO2 22 20*   < > '27 29 27  '$ BUN 36* 39*   < > 30* 27* 28*  CREATININE 0.68 0.78   < > 0.53 0.56 0.62  CALCIUM 8.6* 8.5*   < > 8.7* 8.7* 8.6*  PROT 5.9* 6.0*  --   --   --   --   BILITOT 0.4 0.4  --   --   --   --   ALKPHOS 62 59  --   --   --   --   ALT 27 26  --   --   --   --   AST 28 28  --   --   --   --   GLUCOSE 108* 115*   < > 99 79 84   < > = values in this interval not displayed.    Imaging/Diagnostic Tests: No results found.   Orvis Brill, DO 05/30/2021, 7:17 AM PGY-1, [place] Intern pager: [phone], text pages welcome

## 2021-05-30 NOTE — Progress Notes (Addendum)
ANTICOAGULATION CONSULT NOTE - Initial Consult  Pharmacy Consult for Heparin Indication: atrial fibrillation and pulmonary embolus  No Known Allergies  Patient Measurements: Height: '5\' 1"'$  (154.9 cm) Weight: 69.4 kg (153 lb) IBW/kg (Calculated) : 47.8 Heparin Dosing Weight: 62.6 kg  Vital Signs: Temp: 98.6 F (37 C) (08/18 1940) Temp Source: Oral (08/18 1940) BP: 130/84 (08/18 1940) Pulse Rate: 105 (08/18 1612)  Labs: Recent Labs    05/28/21 0530 05/29/21 0500 05/30/21 0426 05/30/21 2101  APTT  --   --   --  142*  HEPARINUNFRC  --   --   --  >1.10*  CREATININE 0.53 0.56 0.62  --      Estimated Creatinine Clearance: 50.8 mL/min (by C-G formula based on SCr of 0.62 mg/dL).   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   Hyperlipidemia    Hypertension    Personal history of radiation therapy 2009   Respiratory failure ([place]) 05/04/2021    Medications:  Scheduled:   chlorhexidine  15 mL Mouth Rinse BID   Chlorhexidine Gluconate Cloth  6 each Topical Daily   feeding supplement  237 mL Oral BID BM   Gerhardt's butt cream  1 application Topical QID   insulin aspart  0-6 Units Subcutaneous Q4H   lidocaine  1 patch Transdermal Q24H   mouth rinse  15 mL Mouth Rinse q12n4p   metoprolol tartrate  25 mg Oral BID   pantoprazole  40 mg Oral Daily   sacubitril-valsartan  1 tablet Oral BID   sodium chloride flush  10-40 mL Intracatheter Q12H    Assessment: 80yo female with moderate BL acute PE with RHS 8/8 and atrial fibrillation (no anticoagulation PTA). Patient with complicated hospital course including empyema, acute renal  failure, NSTEMI, and PE. Currently on TPN with plans for g-tube.   Earlier this admission, patient was on heparin (8/8-8/12) and then transitioned to oral anticoagulation with apixaban '5mg'$  PO BID on 8/12. Patient has not been able to consistently tolerate oral medications,  last dose of apixaban '5mg'$  given on 8/18 @ 0906, and RN reported patient vomiting shortly after receiving dose. Pharmacy has been consulted for heparin dosing.   Patient was previously therapeutic at 0.33 on heparin 1550 units/hr, and then therapeutic at 0.68 on 1700 units/hr. Restarted heparin at 1650 units/hr with aPTT 142 supratherapeutic. Drawn correctly from PICC line on reft side, heparin is running in PIV on right wrist. HL is high as expected due to apixaban. No issues with infusion or bleeding per RN.    Goal of Therapy:  Heparin level 0.3-0.7 units/ml aPTT 66-102 seconds.  Monitor platelets by anticoagulation protocol: Yes   Plan:  Decrease heparin to 1550 units/hr F/u aPTT until correlates with heparin level  Monitor daily HL, CBC/plt Monitor for signs/symptoms of bleeding   Benetta Spar, PharmD, BCPS, BCCP Clinical Pharmacist  Please check AMION for all [place] phone numbers After 10:00 PM, call [place]

## 2021-05-30 NOTE — Progress Notes (Signed)
@  2245 Dr. Posey Pronto, on-call for attending, text-paged regarding pt's refusal to take evening medications (Entresto and Lopressor) fearing associated emesis. Pt presently denies nausea.  Page promptly returned and MD endorsed she would order 1 time dose of IV Zofran to help prevent pt's nausea. Will administer and continue to monitor.

## 2021-05-30 NOTE — Plan of Care (Signed)
  Problem: Education: Goal: Knowledge of General Education information will improve Description: Including pain rating scale, medication(s)/side effects and non-pharmacologic comfort measures Outcome: Progressing   Problem: Health Behavior/Discharge Planning: Goal: Ability to manage health-related needs will improve Outcome: Progressing   Problem: Clinical Measurements: Goal: Ability to maintain clinical measurements within normal limits will improve Outcome: Progressing Goal: Will remain free from infection Outcome: Progressing Goal: Diagnostic test results will improve Outcome: Progressing Goal: Respiratory complications will improve Outcome: Progressing Goal: Cardiovascular complication will be avoided Outcome: Progressing   Problem: Activity: Goal: Risk for activity intolerance will decrease Outcome: Progressing   Problem: Nutrition: Goal: Adequate nutrition will be maintained Outcome: Progressing   Problem: Coping: Goal: Level of anxiety will decrease Outcome: Progressing   Problem: Elimination: Goal: Will not experience complications related to bowel motility Outcome: Progressing Goal: Will not experience complications related to urinary retention Outcome: Progressing   Problem: Pain Managment: Goal: General experience of comfort will improve Outcome: Progressing   Problem: Safety: Goal: Ability to remain free from injury will improve Outcome: Progressing   Problem: Skin Integrity: Goal: Risk for impaired skin integrity will decrease Outcome: Progressing   Problem: Activity: Goal: Ability to tolerate increased activity will improve Outcome: Progressing   Problem: Respiratory: Goal: Ability to maintain a clear airway and adequate ventilation will improve Outcome: Progressing   Problem: Role Relationship: Goal: Method of communication will improve Outcome: Progressing   Problem: Fluid Volume: Goal: Hemodynamic stability will improve Outcome:  Progressing   Problem: Clinical Measurements: Goal: Diagnostic test results will improve Outcome: Progressing Goal: Signs and symptoms of infection will decrease Outcome: Progressing   Problem: Respiratory: Goal: Ability to maintain adequate ventilation will improve Outcome: Progressing

## 2021-05-30 NOTE — Progress Notes (Signed)
FPTS Brief Progress Note  S Saw patient at bedside this evening. Patient was awake in her bed and comfortable. Patient denies concerns. RN at bedside requesting antiemetic PRN if patient vomits/feels nauseous. He reports pt did have 2 episodes of vomiting today but nothing this evening.   O: BP 130/84 (BP Location: Right Arm)   Pulse (!) 105   Temp 98.6 F (37 C) (Oral)   Resp (!) 21   Ht '5\' 1"'$  (1.549 m)   Wt 69.4 kg   SpO2 91%   BMI 28.91 kg/m    General: Alert, no acute distress, resting comfortably   A/P: Plan per day team  -Informed RN to let us know if pt has nausea/vomiting and I will put in PRN order for zofran  -Crushed tablets with apple sauce  -Orders reviewed. Labs for AM ordered, which was adjusted as needed.    Lattie Haw, MD 05/30/2021, 9:35 PM PGY-3, [facility] Night Resident  Please page [phone] with questions.

## 2021-05-30 NOTE — Progress Notes (Signed)
OT Cancellation Note  Patient Details Name: [name] MRN: [id number] DOB: 1941/09/01   Cancelled Treatment:    Reason Eval/Treat Not Completed: Patient declined, no reason specified. Pt's family declined OT treatment session due to pt "finally falling asleep" and they didn't want her woken up. OT will follow up as schedule allows.  Shakeitha Umbaugh H., OTR/L Acute Rehabilitation  Kyro Joswick Elane Yolanda Bonine 05/30/2021, 4:51 PM

## 2021-05-30 NOTE — Progress Notes (Signed)
  Speech Language Pathology Treatment: Dysphagia  Patient Details Name: Dana Coleman MRN: [id number] DOB: January 05, 1941 Today's Date: 05/30/2021 Time: [id number] SLP Time Calculation (min) (ACUTE ONLY): 18 min  Assessment / Plan / Recommendation Clinical Impression  Pt had episode of emesis earlier today with meds per RN. Upon SLP arrival for skilled dysphagia treatment, family present reported pt consuming mashed potatoes and soft green beans for lunch and tolerating well. Trials of simulated dysphagia 2 solid consumed without difficulty and oral clearance achieved. Bites of soft peaches administered without dentures placed, pt reporting that she didn't need them. After a prolonged period of mastication and oral manipulation, pt expectorated bolus stating she could not masticate it. She subsequently began gagging and emesis followed. RN notified of this incident as well as reported pain from pt. Discussed possible advancement of diet with pt and family given pt exhibits no overt s/sx of aspiration or significant oral dysphagia with dysphagia 2 trials this date. Family and pt expressed desire for an advanced diet. Order placed for dysphagia 2/thin liquid diet. Will f/u and continue to advance as able.    HPI HPI: Pt is a 80 yo female admitted on 05/04/21 for AMS. HX of cholecystectomy on 7/21.  Intubated upon admission, 7/24 Started CRRT, Esophageal perforation with septic shock post flap repair, drainage of empyema and decortication of R lung with chest tube placed.  7/25 EKG changes and probable nstemi. Extubated 7/26. Esophagram 8/1 cleared to start PO diet. CT removed 8/4. Pt with concern for aspiration 8/8 and made NPO. Pt also with new PE with R heart strain and increased O2 HFNC requirements 8/8. PMH includes: R breast CA, DM, CAD, HTN.      SLP Plan  Continue with current plan of care       Recommendations  Diet recommendations: Dysphagia 2 (fine chop);Thin liquid Liquids provided via:  Cup;Straw Medication Administration: Crushed with puree Supervision: Staff to assist with self feeding Compensations: Slow rate;Small sips/bites Postural Changes and/or Swallow Maneuvers: Seated upright 90 degrees;Upright 30-60 min after meal                Oral Care Recommendations: Oral care QID Follow up Recommendations: Inpatient Rehab SLP Visit Diagnosis: Dysphagia, oral phase (R13.11) Plan: Continue with current plan of care       [place], [place], [place] Office Number: [phone]   Dana Coleman 05/30/2021, 2:42 PM

## 2021-05-30 NOTE — Progress Notes (Signed)
Dana Coleman tolerated all her meds this morning. When she swished the Chlorhexidine mouthwash she started to vomit.  Emesis output was ~168m of applesauce, meds and orange juice.  Will continue to monitor and see how she does at 10am. Attending to be notified.

## 2021-05-31 DIAGNOSIS — K223 Perforation of esophagus: Secondary | ICD-10-CM | POA: Diagnosis not present

## 2021-05-31 LAB — BASIC METABOLIC PANEL
Anion gap: 7 (ref 5–15)
BUN: 30 mg/dL — ABNORMAL HIGH (ref 8–23)
CO2: 26 mmol/L (ref 22–32)
Calcium: 8.5 mg/dL — ABNORMAL LOW (ref 8.9–10.3)
Chloride: 102 mmol/L (ref 98–111)
Creatinine, Ser: 0.58 mg/dL (ref 0.44–1.00)
GFR, Estimated: >60 mL/min (ref >60)
Glucose, Bld: 108 mg/dL — ABNORMAL HIGH (ref 70–99)
Potassium: 4.2 mmol/L (ref 3.5–5.1)
Sodium: 135 mmol/L (ref 135–145)

## 2021-05-31 LAB — GLUCOSE, CAPILLARY
Glucose-Capillary: 131 mg/dL — ABNORMAL HIGH (ref 70–99)
Glucose-Capillary: 78 mg/dL (ref 70–99)
Glucose-Capillary: 99 mg/dL (ref 70–99)
Glucose-Capillary: 94 mg/dL (ref 70–99)
Glucose-Capillary: 112 mg/dL — ABNORMAL HIGH (ref 70–99)

## 2021-05-31 LAB — CBC
HCT: 31 % — ABNORMAL LOW (ref 36.0–46.0)
Hemoglobin: 10.1 g/dL — ABNORMAL LOW (ref 12.0–15.0)
MCH: 30.3 pg (ref 26.0–34.0)
MCHC: 32.6 g/dL (ref 30.0–36.0)
MCV: 93.1 fL (ref 80.0–100.0)
Platelets: 270 10*3/uL (ref 150–400)
RBC: 3.33 MIL/uL — ABNORMAL LOW (ref 3.87–5.11)
RDW: 18.6 % — ABNORMAL HIGH (ref 11.5–15.5)
WBC: 10.8 10*3/uL — ABNORMAL HIGH (ref 4.0–10.5)
nRBC: 0 % (ref 0.0–0.2)

## 2021-05-31 LAB — APTT
aPTT: 130 seconds — ABNORMAL HIGH (ref 24–36)
aPTT: 68 seconds — ABNORMAL HIGH (ref 24–36)

## 2021-05-31 MED ORDER — TRACE MINERALS CU-MN-SE-ZN 300-55-60-3000 MCG/ML IV SOLN
INTRAVENOUS | Status: AC
  Filled 2021-05-31: qty 745.53

## 2021-05-31 NOTE — Progress Notes (Signed)
PHARMACY - TOTAL PARENTERAL NUTRITION CONSULT NOTE  Indication: s/p esophageal perforation and repair  Patient Measurements: Height: '5\' 1"'$  (154.9 cm) Weight: 71.2 kg (157 lb) IBW/kg (Calculated) : 47.8   Body mass index is 29.66 kg/m.  Assessment:  80 y/o female with recent lap chole for symptomatic cholelithiasis on 7/21. PMH including HTN, HLD, DM, R breast cancer, and CAD. Patient presented on 7/23 with AMS and unresponsiveness - was complaining of abdominal discomfort and then patient became unresponsive and slumped on table.  S/p gall bladder surgery and esophageal perforation-repaired 7/24.  Pharmacy consulted for TPN management.  Glucose / Insulin: DM with A1c 7.4% on Lantus 24/d PTA.  CBGs 108-158 Utilized 2 units sens SSI + 70 units insulin in TPN - decreased 8/15 - decrease 8/18 to 70 units in TPN  Electrolytes: K 4.2, Na 135, CorCa wnl Renal: septic ATN s/p CRRT (end 7/25) - SCr < 1 and stable, BUN at 30 Hepatic: LFTs significantly increased 8/4 (decreased with holding TPN for 6 hours and Lasix), tbili WNL, albumin down 1.9, prealbumin up 8.9, TG WNL Intake / Output; MIVF:  UOP not measured, LBM 8/18; Emesis 150 mls  GI Imaging: - 7/23 CT: no acute abnormalities  GI Surgeries / Procedures:  - 7/21: s/p laparoscopic cholecystectomy and LOA - 7/24: s/p R thoracotomy, repair of esophageal perf with muscle flap, drainage of empyema and decortication of R lung  Central access: PICC 7/26 (double lumen) TPN start date: 05/07/21  Nutritional Goals (per RD rec on 8/9): kCal: 1750-2030 , Protein: 90-120 , Fluid: >=1.8L  Current Nutrition:  TPN Ensure Enlive BID - none charted 8/12 Dysphagia 1 diet, little intake No oral feeding tube recommended due to esophageal injury F/u IR for possible gastrostomy tube  Plan:  Continue concentrated cyclic TPN over 16 hr at 1575 mL over 16 hrs: 53 mL/hr x 1 hr, then 105 mL/hr x 14 hrs, then 53 mL/hr x 1 hr. TPN provides 111g AA, 230g  CHO, and 58g ILE for total of 1803 kCal, meeting 100% of patient's needs. Electrolytes in TPN: Na 30 mEq/L (= 47 mEq), K 25 mEq/L (= 39 mEq), Ca 70mq/L since 7/30, Mg 11 mEq/L (= 17 mEq), Phos 30 mmol/L (= 47 mmol), maximum acetate Add standard MVI and trace elements to TPN Continue sens SSI Q4H and insulin in TPN at 70 units. Watch mid-day lower CBGs and encourage po intake   CAlanda Slim PharmD, FWest Haven Va Medical CenterClinical Pharmacist Please see AMION for all Pharmacists' Contact Phone Numbers 05/31/2021, 7:27 AM

## 2021-05-31 NOTE — Progress Notes (Signed)
ANTICOAGULATION CONSULT NOTE - Follow-Up Consult  Pharmacy Consult for Heparin Indication: atrial fibrillation and pulmonary embolus  No Known Allergies  Patient Measurements: Height: '5\' 1"'$  (154.9 cm) Weight: 71.2 kg (157 lb) IBW/kg (Calculated) : 47.8 Heparin Dosing Weight: 62.6 kg  Vital Signs: Temp: 98.4 F (36.9 C) (08/19 1930) Temp Source: Oral (08/19 1930) BP: 122/73 (08/19 1930) Pulse Rate: 99 (08/19 1656)  Labs: Recent Labs    05/29/21 0500 05/30/21 0426 05/30/21 2101 05/31/21 0603 05/31/21 1004 05/31/21 2000  HGB  --   --   --  10.1*  --   --   HCT  --   --   --  31.0*  --   --   PLT  --   --   --  270  --   --   APTT  --   --  142*  --  130* 68*  HEPARINUNFRC  --   --  >1.10*  --   --   --   CREATININE 0.56 0.62  --  0.58  --   --      Estimated Creatinine Clearance: 51.5 mL/min (by C-G formula based on SCr of 0.58 mg/dL).  Assessment: 80yo female with moderate BL acute PE with RHS 8/8 and atrial fibrillation (no anticoagulation PTA).  Patient with complicated hospital course including empyema, acute renal failure, NSTEMI, and PE.  Currently on TPN with plans for g-tube.   Earlier this admission, patient was on heparin (8/8-8/12) and then transitioned to oral anticoagulation with apixaban '5mg'$  PO BID on 8/12.  Patient has not been able to consistently tolerate oral medications, last dose of apixaban '5mg'$  given on 8/18 @ 0906, and RN reported patient vomiting shortly after receiving dose.  Pharmacy has been consulted for heparin dosing.   aPTT 68 and therapeutic on low end of goal on 1450 units/hr.  No line issues or signs/symptoms of bleeding per RN.    Goal of Therapy:  Heparin level 0.3-0.7 units/ml aPTT 66-102 seconds.  Monitor platelets by anticoagulation protocol: Yes   Plan:  Increase heparin drip to 1500 units/hr F/u aPTT until correlates with heparin level  Monitor daily aPTT, HL, CBC/plt Monitor for signs/symptoms of bleeding   Thank you for  involving pharmacy in this patient's care.  Renold Genta, PharmD, BCPS Clinical Pharmacist Clinical phone for 05/31/2021 until 10p is [phone] 05/31/2021 10:05 PM  **Pharmacist phone directory can be found on [email or web address] listed under [place]**

## 2021-05-31 NOTE — Progress Notes (Signed)
Medications were tolerated well this morning. Did not use scheduled mouth rinse as it made the patient vomit yesterday.  Patient was put on 2L of O2 last night due to low rates. Have progressed to 1L and will continue to monitor and progress improvement.

## 2021-05-31 NOTE — Progress Notes (Signed)
FPTS Brief Progress Note  S Saw patient at bedside this evening. Patient was sleeping comfortably. I did not wake the patient.  O: BP 122/73 (BP Location: Right Arm)   Pulse 99   Temp 98.4 F (36.9 C) (Oral)   Resp 18   Ht '5\' 1"'$  (1.549 m)   Wt 71.2 kg   SpO2 91%   BMI 29.66 kg/m    General: Sleeping comfortably  A/P: Plan per day team  - Orders reviewed. Labs for AM ordered, which was adjusted as needed.   Lattie Haw, MD 05/31/2021, 10:20 PM PGY-3, [facility] Night Resident  Please page [phone] with questions.

## 2021-05-31 NOTE — Progress Notes (Addendum)
ANTICOAGULATION CONSULT NOTE - Follow-Up Consult  Pharmacy Consult for Heparin Indication: atrial fibrillation and pulmonary embolus  No Known Allergies  Patient Measurements: Height: '5\' 1"'$  (154.9 cm) Weight: 71.2 kg (157 lb) IBW/kg (Calculated) : 47.8 Heparin Dosing Weight: 62.6 kg  Vital Signs: Temp: 98.5 F (36.9 C) (08/19 1220) Temp Source: Oral (08/19 1220) BP: 127/79 (08/19 1220) Pulse Rate: 99 (08/19 1220)  Labs: Recent Labs    05/29/21 0500 05/30/21 0426 05/30/21 2101 05/31/21 0603 05/31/21 1004  HGB  --   --   --  10.1*  --   HCT  --   --   --  31.0*  --   PLT  --   --   --  270  --   APTT  --   --  142*  --  130*  HEPARINUNFRC  --   --  >1.10*  --   --   CREATININE 0.56 0.62  --  0.58  --     Estimated Creatinine Clearance: 51.5 mL/min (by C-G formula based on SCr of 0.58 mg/dL).   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   Hyperlipidemia    Hypertension    Personal history of radiation therapy 2009   Respiratory failure ([place]) 05/04/2021    Medications:  Scheduled:   chlorhexidine  15 mL Mouth Rinse BID   Chlorhexidine Gluconate Cloth  6 each Topical Daily   feeding supplement  237 mL Oral BID BM   Gerhardt's butt cream  1 application Topical QID   insulin aspart  0-6 Units Subcutaneous Q4H   lidocaine  1 patch Transdermal Q24H   mouth rinse  15 mL Mouth Rinse q12n4p   metoprolol tartrate  25 mg Oral BID   pantoprazole  40 mg Oral Daily   sacubitril-valsartan  1 tablet Oral BID   sodium chloride flush  10-40 mL Intracatheter Q12H    Assessment: 80yo female with moderate BL acute PE with RHS 8/8 and  atrial fibrillation (no anticoagulation PTA).  Patient with complicated hospital course including empyema, acute renal failure, NSTEMI, and PE.  Currently on TPN with plans for g-tube.   Earlier this admission, patient was on heparin (8/8-8/12) and then transitioned to oral anticoagulation with apixaban '5mg'$  PO BID on 8/12.  Patient has not been able to consistently tolerate oral medications, last dose of apixaban '5mg'$  given on 8/18 @ 0906, and RN reported patient vomiting shortly after receiving dose.  Pharmacy has been consulted for heparin dosing.   aPTT 130 supratherapeutic this morning on heparin 1550 units/hr.  No line issues or signs/symptoms of bleeding per RN.     Goal of Therapy:  Heparin level 0.3-0.7 units/ml aPTT 66-102 seconds.  Monitor platelets by anticoagulation protocol: Yes   Plan:  Decrease heparin to 1450 units/hr F/u aPTT until correlates with heparin level  Monitor daily HL, CBC/plt Monitor for signs/symptoms of bleeding   Vance Peper, PharmD PGY1 Pharmacy Resident 05/31/2021 12:35 PM   Please check AMION for all [facility] phone numbers After 10:00 PM,  call [facility] [phone]

## 2021-05-31 NOTE — Care Management Important Message (Signed)
Important Message  Patient Details  Name: Dana Coleman MRN: [id number] Date of Birth: 1941-08-24   Medicare Important Message Given:  Yes     Shelda Altes 05/31/2021, 9:09 AM

## 2021-05-31 NOTE — Consult Note (Signed)
Chief Complaint: Dysphagia. Request is for gastrostomy tube placement.   Referring Physician(s): Dr. Jerilynn Mages. Chambliss  Supervising Physician: Sandi Mariscal  Patient Status: [facility] - Out-pt  History of Present Illness: Dana Coleman is a 80 y.o. female History of  breast cancer, DM, CAD, HTN. cholecystectomy on 7.21.22. Admitted on 7.23.22 for  hypoxemia found to have a large right sided empyema s/p right thoracotomy  and decortication on 7.24.22, repair esophageal perforation with muscle flap. NSTEMI on 7.25. Found to be in a fib with RVR and a PE with right heart strain on 8.8.22.  Team is requesting a g tube placement due to malnutrition and dysphagia.   Currently without any significant complaints. Patient alert and laying in bed, calm and comfortable. Denies any fevers, headache, chest pain, SOB, cough, abdominal pain, nausea, vomiting or bleeding.  Return precautions and treatment recommendations and follow-up discussed with the patient who is agreeable with the plan.   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   Hyperlipidemia    Hypertension    Personal history of radiation therapy 2009   Respiratory failure ([place]) 05/04/2021    Past Surgical History:  Procedure Laterality Date   BREAST LUMPECTOMY Right 2009   BREAST SURGERY  2009   right lumpectomy   [place] N/A 05/02/2021   Procedure: LAPAROSCOPIC CHOLECYSTECTOMY;  Surgeon: Greer Pickerel, MD;  Location: [facility] ORS;  Service: General;  Laterality: N/A;   COLONOSCOPY     LEFT HEART CATHETERIZATION WITH CORONARY ANGIOGRAM N/A 04/20/2014   Procedure: LEFT HEART  CATHETERIZATION WITH CORONARY ANGIOGRAM;  Surgeon: Jacolyn Reedy, MD;  Location: [facility] CATH LAB;  Service: Cardiovascular;  Laterality: N/A;   POLYPECTOMY     STENT PLACEMENT VASCULAR ([place] HX)     THORACOTOMY Right 05/05/2021   Procedure: THORACOTOMY MAJOR REPAIR PERFORATED ESOPHAGUS;  Surgeon: Gaye Pollack, MD;  Location: [facility] OR;  Service: Thoracic;  Laterality: Right;   thyroid     for nodule hemithyroidectomy, benign   THYROIDECTOMY Right 03/2003    Allergies: Patient has no known allergies.  Medications: Prior to Admission medications   Medication Sig Start Date End Date Taking? Authorizing Provider  amLODipine (NORVASC) 10 MG tablet TAKE 1 TABLET BY MOUTH EVERY DAY Patient taking differently: Take 10 mg by mouth daily. 07/20/20  Yes Gifford Shave, MD  aspirin EC 81 MG tablet Take 81 mg by mouth in the morning. Swallow whole.   Yes [provider]  Cholecalciferol (VITAMIN D3) 50 MCG (2000 UT) TABS Take 2,000 Units by mouth in the morning.   Yes [provider]  hydrochlorothiazide (HYDRODIURIL) 12.5 MG tablet TAKE 1 TABLET BY MOUTH EVERY DAY Patient taking differently: Take 12.5 mg by mouth daily. 09/25/20  Yes Gifford Shave, MD  JARDIANCE 10 MG TABS tablet TAKE 1 TABLET BY MOUTH EVERY DAY Patient taking differently: Take 10 mg by mouth daily. 12/05/20  Yes Gifford Shave, MD  LANTUS SOLOSTAR 100 UNIT/ML Solostar Pen INJECT 24 UNITS INTO THE SKIN EVERY MORNING. Patient taking differently: Inject  24 Units into the skin daily. 12/31/20  Yes Gifford Shave, MD  losartan (COZAAR) 50 MG tablet Take 50 mg by mouth in the morning. 05/24/19  Yes [provider]  metFORMIN (GLUCOPHAGE-XR) 500 MG 24 hr tablet TAKE 1 TABLET BY MOUTH TWICE A DAY WITH BREAKFAST AND DINNER Patient taking differently: Take 500 mg by mouth 2 (two) times daily. 03/25/21  Yes Gifford Shave, MD  nitroGLYCERIN (NITROSTAT) 0.4 MG SL tablet Place 1 tablet (0.4 mg total) under the  tongue every 5 (five) minutes as needed for chest pain. Patient taking differently: Place 0.4 mg under the tongue every 5 (five) minutes x 3 doses as needed for chest pain. 07/12/19  Yes Hensel, Jamal Collin, MD  omeprazole (PRILOSEC) 40 MG capsule Take 40 mg by mouth every morning. 12/12/20  Yes [provider]  Blood Glucose Monitoring Suppl MISC One touch ultra glucose monitor. Check blood sugar twice a day.    [provider]  glucose blood (ONE TOUCH ULTRA TEST) test strip CHECK BLOOD SUGAR TWICE DAILY 09/24/18   Everrett Coombe, MD  Insulin Pen Needle (B-D UF III MINI PEN NEEDLES) 31G X 5 MM MISC INJECT INSULIN VIA PEN 6 TIMES DAILY 02/22/18   Everrett Coombe, MD  OneTouch Delica Lancets [id number] MISC 1 Container by Does not apply route as needed. 06/07/19   Zenia Resides, MD  rosuvastatin (CRESTOR) 20 MG tablet Take 1 tablet (20 mg total) by mouth at bedtime. 05/24/21   Lurline Del, DO     Family History  Problem Relation Age of Onset   Heart disease Mother        age 31   Heart disease Father    Cancer Father        unknown   Heart disease Brother    Hypertension Brother    Diabetes Daughter    Stroke Daughter    Cirrhosis Brother    Colon cancer Neg Hx    Colon polyps Neg Hx    Esophageal cancer Neg Hx    Stomach cancer Neg Hx    Rectal cancer Neg Hx     Social History   Socioeconomic History   Marital status: Divorced    Spouse name: Not on file   Number of children: 5   Years of education: 10   Highest education level: 10th grade  Occupational History   Occupation: Retired- IT trainer  Tobacco Use   Smoking status: Never   Smokeless tobacco: Never   Tobacco comments:    No plans to start  Vaping Use   Vaping Use: Never used  Substance and Sexual Activity   Alcohol use: No   Drug use: No   Sexual activity: Not Currently    Birth control/protection: Condom  Other Topics Concern   Not on file  Social History Narrative   Current Social  History 07/15/2018     Lives alone, divorced. Lives in one level house, has three stairs in front, with handrail. No grab bars in bathroom. Does have throw rugs on floor. Has smoke alarms, adequate lighting. Has family in [place].  Does light house cleaning part time.     Transportation: Patient has own vehicle    Religious / Personal Beliefs: Harvel: info given   End of Life Plan: gave pt living will referral info   Emergency Contact: daughter, Solmon Ice [phone]   Any pets: none   Diet: Pt has a varied diet of protein, starch, and  vegetables and fruits.   Seatbelts: Pt reports wearing seatbelt when in vehicles.    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    Current Stressors: Daughter, Danton Clap (58 yo) had MI                                                          Social Determinants of Health   Financial Resource Strain: Not on file  Food Insecurity: Not on file  Transportation Needs: Not on file  Physical Activity: Not on file  Stress: Not on file  Social Connections: Not on file    Review of Systems: A 12 point ROS discussed and pertinent positives are indicated in the HPI above.  All other systems are negative.  Review of Systems  Constitutional:  Negative for fatigue and fever.  HENT:  Negative for congestion.   Respiratory:  Negative for cough and shortness of breath.   Gastrointestinal:  Negative for abdominal pain, diarrhea, nausea and vomiting.   Vital Signs: BP 121/86 (BP Location: Right Arm)   Pulse 100   Temp 97.9 F (36.6 C) (Oral)   Resp 20   Ht '5\' 1"'$  (1.549 m)   Wt 157 lb (71.2 kg)   SpO2 95%   BMI 29.66 kg/m   Physical Exam Vitals and nursing note reviewed.  Constitutional:      Appearance: She is well-developed.  HENT:     Head: Normocephalic and atraumatic.  Eyes:     Conjunctiva/sclera: Conjunctivae normal.  Cardiovascular:     Rate and Rhythm: Normal rate and regular  rhythm.     Heart sounds: Normal heart sounds.  Pulmonary:     Effort: Pulmonary effort is normal.     Breath sounds: Normal breath sounds.  Musculoskeletal:     Cervical back: Normal range of motion.  Neurological:     Mental Status: She is alert and oriented to person, place, and time.    Imaging: CT ABDOMEN PELVIS WO CONTRAST  Result Date: 05/04/2021 CLINICAL DATA:  Pneumothorax, abdominal distension per order. Per Dr. Robby Sermon request: the nurse pushed 56m contrast just before scanning through NG tube to rule out pooling in the chest. Cholecystectomy performed on 05/02/2021. EXAM: CT CHEST, ABDOMEN AND PELVIS WITHOUT CONTRAST TECHNIQUE: Multidetector CT imaging of the chest, abdomen and pelvis was performed following the standard protocol without IV contrast. COMPARISON:  Current chest radiograph. Abdomen and pelvis CT, 07/28/2008. Right upper quadrant ultrasound, 01/21/2021. FINDINGS: CT CHEST FINDINGS Cardiovascular: Heart is normal in size. Three-vessel coronary artery calcifications. No pericardial effusion. Great vessels are top-normal in size. Mild aortic atherosclerotic calcifications. Mediastinum/Nodes: Endotracheal tube tip lies 2 cm above the carinal. No neck base, mediastinal or hilar masses. No enlarged lymph nodes. Trachea is unremarkable. There is contrast within the esophagus. Nasogastric tube tip terminates in the distal esophagus. Lungs/Pleura: Right-sided chest tube has its tip in the posterior mid right hemithorax. Right-sided hydropneumothorax. Pneumothorax is approximately 40%. Small 2 moderate amount of pleural fluid with intermixed bubbles of air. Fluid appears partly loculated. Trace amount of left pleural fluid. No left pneumothorax. There is opacity in the posterior aspect of the right upper lobe and middle lobe with collapse/consolidation of the right lower lobe. Minimal dependent atelectasis is noted on the left. Left lung otherwise  clear. Musculoskeletal: No  fracture or acute finding.  No bone lesion. CT ABDOMEN PELVIS FINDINGS Hepatobiliary: No focal liver abnormality is seen. Status post cholecystectomy. No biliary dilatation. Pancreas: Unremarkable. No pancreatic ductal dilatation or surrounding inflammatory changes. Spleen: Relatively small spleen.  No mass. Adrenals/Urinary Tract: No adrenal masses. Bilateral renal masses. 6 cm hypoattenuating mass arises from the lower pole of the right kidney. Somewhat more complex appearing mass, also hypoattenuating, arises from the mid to upper pole the left kidney, 5.9 cm in size. Smaller round mass arises from the anterior lower pole the left kidney, 2 cm in size. These are all consistent with cysts. No other renal masses, no stones and no hydronephrosis. Ureters are normal in course and in caliber. Bladder is mostly decompressed with a Foley catheter. Stomach/Bowel: Stomach is unremarkable. Small bowel and colon are normal in caliber. No wall thickening. No inflammation. Normal appendix visualized. Vascular/Lymphatic: Aortic atherosclerosis. No enlarged abdominal or pelvic lymph nodes. Reproductive: Unremarkable. Other: Small bubbles of air are noted in the anterior abdominal wall subcutaneous fat consistent with postoperative air. There is hazy inflammatory type change in the upper abdomen centered on the porta hepatis, also consistent with expected postoperative change. No ascites. No abdominal wall hernia. Musculoskeletal: No fracture or acute finding.  No bone lesion. IMPRESSION: 1. Right hydropneumothorax. Pneumothorax is approximately 40%. There is partly loculated pleural fluid and contains bubbles of air. A right-sided chest tube has its tip lying in the posterior right mid hemithorax. 2. Consolidation/atelectasis in the dependent right lung, involving the entire right lower lobe. A component of infection should be considered in the proper clinical setting. 3. Nasogastric tube tip lies in the distal esophagus. 4.  Hazy opacity noted in the upper abdomen centered on the porta hepatis consistent with the expected postoperative change following the recent cholecystectomy. There is no abscess or evidence of an operative complication. 5. Coronary artery and aortic atherosclerosis. 6. Well-positioned endotracheal tube. Electronically Signed   By: Lajean Manes M.D.   On: 05/04/2021 18:35   CT HEAD WO CONTRAST  Result Date: 05/04/2021 CLINICAL DATA:  80 year old female with altered mental status. EXAM: CT HEAD WITHOUT CONTRAST TECHNIQUE: Contiguous axial images were obtained from the base of the skull through the vertex without intravenous contrast. COMPARISON:  None. FINDINGS: Brain: Mild age-related atrophy and moderate chronic microvascular ischemic changes. There is no acute intracranial hemorrhage. No mass effect or midline shift. No extra-axial fluid collection. Vascular: No hyperdense vessel or unexpected calcification. Skull: Normal. Negative for fracture or focal lesion. Sinuses/Orbits: No acute finding. Other: An endotracheal and enteric tube are partially visualized. IMPRESSION: 1. No acute intracranial pathology. 2. Age-related atrophy and chronic microvascular ischemic changes. Electronically Signed   By: Anner Crete M.D.   On: 05/04/2021 18:19   CT Chest Wo Contrast  Result Date: 05/04/2021 CLINICAL DATA:  Pneumothorax, abdominal distension per order. Per Dr. Robby Sermon request: the nurse pushed 11m contrast just before scanning through NG tube to rule out pooling in the chest. Cholecystectomy performed on 05/02/2021. EXAM: CT CHEST, ABDOMEN AND PELVIS WITHOUT CONTRAST TECHNIQUE: Multidetector CT imaging of the chest, abdomen and pelvis was performed following the standard protocol without IV contrast. COMPARISON:  Current chest radiograph. Abdomen and pelvis CT, 07/28/2008. Right upper quadrant ultrasound, 01/21/2021. FINDINGS: CT CHEST FINDINGS Cardiovascular: Heart is normal in size. Three-vessel  coronary artery calcifications. No pericardial effusion. Great vessels are top-normal in size. Mild aortic atherosclerotic calcifications. Mediastinum/Nodes: Endotracheal tube tip lies 2 cm above the carinal. No  neck base, mediastinal or hilar masses. No enlarged lymph nodes. Trachea is unremarkable. There is contrast within the esophagus. Nasogastric tube tip terminates in the distal esophagus. Lungs/Pleura: Right-sided chest tube has its tip in the posterior mid right hemithorax. Right-sided hydropneumothorax. Pneumothorax is approximately 40%. Small 2 moderate amount of pleural fluid with intermixed bubbles of air. Fluid appears partly loculated. Trace amount of left pleural fluid. No left pneumothorax. There is opacity in the posterior aspect of the right upper lobe and middle lobe with collapse/consolidation of the right lower lobe. Minimal dependent atelectasis is noted on the left. Left lung otherwise clear. Musculoskeletal: No fracture or acute finding.  No bone lesion. CT ABDOMEN PELVIS FINDINGS Hepatobiliary: No focal liver abnormality is seen. Status post cholecystectomy. No biliary dilatation. Pancreas: Unremarkable. No pancreatic ductal dilatation or surrounding inflammatory changes. Spleen: Relatively small spleen.  No mass. Adrenals/Urinary Tract: No adrenal masses. Bilateral renal masses. 6 cm hypoattenuating mass arises from the lower pole of the right kidney. Somewhat more complex appearing mass, also hypoattenuating, arises from the mid to upper pole the left kidney, 5.9 cm in size. Smaller round mass arises from the anterior lower pole the left kidney, 2 cm in size. These are all consistent with cysts. No other renal masses, no stones and no hydronephrosis. Ureters are normal in course and in caliber. Bladder is mostly decompressed with a Foley catheter. Stomach/Bowel: Stomach is unremarkable. Small bowel and colon are normal in caliber. No wall thickening. No inflammation. Normal appendix  visualized. Vascular/Lymphatic: Aortic atherosclerosis. No enlarged abdominal or pelvic lymph nodes. Reproductive: Unremarkable. Other: Small bubbles of air are noted in the anterior abdominal wall subcutaneous fat consistent with postoperative air. There is hazy inflammatory type change in the upper abdomen centered on the porta hepatis, also consistent with expected postoperative change. No ascites. No abdominal wall hernia. Musculoskeletal: No fracture or acute finding.  No bone lesion. IMPRESSION: 1. Right hydropneumothorax. Pneumothorax is approximately 40%. There is partly loculated pleural fluid and contains bubbles of air. A right-sided chest tube has its tip lying in the posterior right mid hemithorax. 2. Consolidation/atelectasis in the dependent right lung, involving the entire right lower lobe. A component of infection should be considered in the proper clinical setting. 3. Nasogastric tube tip lies in the distal esophagus. 4. Hazy opacity noted in the upper abdomen centered on the porta hepatis consistent with the expected postoperative change following the recent cholecystectomy. There is no abscess or evidence of an operative complication. 5. Coronary artery and aortic atherosclerosis. 6. Well-positioned endotracheal tube. Electronically Signed   By: Lajean Manes M.D.   On: 05/04/2021 18:35   CT Angio Chest Pulmonary Embolism (PE) W or WO Contrast  Result Date: 05/20/2021 CLINICAL DATA:  Thoracotomy for perforated esophagus 05/05/2021. Prolonged hospitalization, right empyema with chest tube. Respiratory failure. Atrial fibrillation. Elevated D-dimer level. EXAM: CT ANGIOGRAPHY CHEST WITH CONTRAST TECHNIQUE: Multidetector CT imaging of the chest was performed using the standard protocol during bolus administration of intravenous contrast. Multiplanar CT image reconstructions and MIPs were obtained to evaluate the vascular anatomy. CONTRAST:  68m OMNIPAQUE IOHEXOL 350 MG/ML SOLN COMPARISON:  Chest  radiograph 05/19/2021 and chest CT from 03/29/2021 FINDINGS: Cardiovascular: Filling defect in the right middle lobe, right lower lobe, and left upper lobe pulmonary arterial tree is compatible with acute pulmonary embolus. Clot burden is moderate. Right ventricular to left ventricular ratio 1.2. Moderate cardiomegaly. Coronary, aortic arch, and branch vessel atherosclerotic vascular disease. Mediastinum/Nodes: Likely reactive scattered mediastinal lymph nodes including  a right lower paratracheal node at 1.2 cm in short axis on image 160 series 7 (formerly 0.5 cm). Lungs/Pleura: Small left pleural effusion. Complex right pleural effusion with loculated elements compatible with empyema. Questionable trace residual locule of pleural gas posteriorly in the right hemithorax on image 37 series 5. There is atelectasis along the margins of the presumed empyema, along with secondary pulmonary lobular interstitial accentuation in the right lung and airway thickening in the right lung compatible with interstitial edema. There is also some consolidation inferiorly in the right lower lobe for example image 95 series 7, possibly from pulmonary hemorrhage or pneumonia. Upper Abdomen: Unremarkable Musculoskeletal: There is evidence of prior right thoracotomy with a staple line noted. Abnormal density along the right chest wall including along the serratus anterior muscle with thickening and likely hematoma in this region. Calcified right lateral breast lesion on image 88 series 5. Right fourth rib osteotomy. Lateral right fifth and sixth rib fractures. Mild thoracic spondylosis. Review of the MIP images confirms the above findings. IMPRESSION: 1. Moderate bilateral acute pulmonary embolus. Positive for acute PE with CT evidence of right heart strain (RV/LV Ratio = 1.2) consistent with at least submassive (intermediate risk) PE. The presence of right heart strain has been associated with an increased risk of morbidity and  mortality. Please refer to the "PE Focused" order set in EPIC. 2. Complex right pleural effusion compatible with empyema. 3. Airway thickening and secondary pulmonary lobular interstitial accentuation in the right lung potentially from interstitial edema. Consolidation in the right lower lobe from pulmonary hemorrhage or pneumonia. 4. Prior right thoracotomy, with right fourth rib osteotomy in fractures the right lateral fifth and sixth ribs. Hematoma along the right serratus anterior muscle. 5. Small left pleural effusion. 6. Reactive mediastinal lymph nodes. 7.  Aortic Atherosclerosis (ICD10-I70.0).  Coronary atherosclerosis. Critical Value/emergent results were called by telephone at the time of interpretation on 05/20/2021 at 6:16 am to provider Dr. Andrena Mews, who verbally acknowledged these results. Electronically Signed   By: Van Clines M.D.   On: 05/20/2021 06:23   DG Chest Portable 1 View  Result Date: 05/22/2021 CLINICAL DATA:  Dyspnea. History of diabetes, hypertension, and myocardial infarction. EXAM: PORTABLE CHEST 1 VIEW COMPARISON:  05/21/2021 FINDINGS: The patient is rotated to the right on today's radiograph, reducing diagnostic sensitivity and specificity. Left central line tip: SVC. Skin staples project over the right lateral hemithorax, history of thoracotomy on 05/05/2021 for esophageal repair. Continued pleural thickening on the right Atherosclerotic calcification of the aortic arch. Stable mild to moderate cardiomegaly. Stable small left pleural effusion. Stable asymmetric interstitial accentuation in the right lung. Stable airspace opacity in the right lower lobe. IMPRESSION: 1. Stable appearance of the chest, including bibasilar airspace opacities, right pleural thickening, asymmetric right interstitial accentuation, and postoperative findings related to prior right thoracotomy. 2.  Aortic Atherosclerosis (ICD10-I70.0). Electronically Signed   By: Van Clines M.D.   On:  05/22/2021 07:22   DG CHEST PORT 1 VIEW  Result Date: 05/21/2021 CLINICAL DATA:  Shortness of breath, known pulmonary emboli. EXAM: PORTABLE CHEST 1 VIEW COMPARISON:  05/19/2021 FINDINGS: Cardiac shadow remains enlarged. Left-sided PICC line is stable. Postsurgical changes in the right chest wall are again noted. Stable bibasilar opacities are seen. Patchy airspace disease is noted within the right lung as well. IMPRESSION: Stable appearance of the chest when compared with the prior exam. Electronically Signed   By: Inez Catalina M.D.   On: 05/21/2021 02:24   DG CHEST PORT  1 VIEW  Result Date: 05/19/2021 CLINICAL DATA:  Dyspnea EXAM: PORTABLE CHEST 1 VIEW COMPARISON:  05/17/2021 FINDINGS: Cardiac shadow is enlarged but stable. Aortic calcifications are again seen. Left-sided PICC line is noted and stable. Postsurgical changes are seen in the right lateral chest wall. Multiple rib fractures are noted on the right which appears stable in appearance from previous exam. No pneumothorax is noted. Patchy bibasilar airspace opacity is noted. IMPRESSION: Stable bibasilar airspace opacity. No recurrent pneumothorax is noted. Electronically Signed   By: Inez Catalina M.D.   On: 05/19/2021 22:01   DG CHEST PORT 1 VIEW  Result Date: 05/17/2021 CLINICAL DATA:  Encounter for chest tube removal Z46.82 (ICD-10-CM). CT angiogram chest 03/29/2021. EXAM: PORTABLE CHEST 1 VIEW COMPARISON:  Prior chest radiographs 05/16/2021 and earlier. FINDINGS: Interval removal of a right-sided chest tube. Unchanged position of a left-sided PICC with tip projecting at the level of the lower SVC. The cardiomediastinal silhouette is unchanged. No evidence of pneumothorax. As before, there is suggestion of interstitial edema within the right lung. Persistent small right pleural effusion with associated right basilar atelectasis and/or consolidation. Persistent mild left basilar atelectasis. No acute bony abnormality identified. Residual contrast  within the stomach. Surgical staples overlying the right chest and right axilla. IMPRESSION: Interval removal of a right chest tube. No evidence of pneumothorax. As before, there is suggestion of interstitial edema within the right lung. Persistent small right pleural effusion with associated right basilar atelectasis and/or consolidation. Persistent mild left basilar atelectasis. Electronically Signed   By: Kellie Simmering DO   On: 05/17/2021 08:34   DG CHEST PORT 1 VIEW  Result Date: 05/16/2021 CLINICAL DATA:  History of esophageal perforation. EXAM: PORTABLE CHEST 1 VIEW COMPARISON:  05/15/2021. FINDINGS: Interval removal of right lower chest tube. Right upper chest tube remains. No pneumothorax. Left PICC line stable position. Cardiomegaly again noted. Mild bilateral interstitial prominence and bilateral pleural effusions noted. Mild CHF cannot be excluded. Surgical staples right chest. Surgical clips noted over the upper mid chest. Contrast in the stomach. Degenerative change thoracic spine. IMPRESSION: 1. Interim removal of right lower chest tube. Right upper chest tube in stable position. No pneumothorax. Left PICC line stable position. 2. Cardiomegaly with mild bilateral interstitial prominence and bilateral pleural effusions suggesting mild CHF. Electronically Signed   By: Marcello Moores  Register   On: 05/16/2021 08:29   DG Chest Port 1 View  Result Date: 05/15/2021 CLINICAL DATA:  Chest tube removal EXAM: PORTABLE CHEST 1 VIEW COMPARISON:  05/14/2021 FINDINGS: Interval removal of one previously seen right-sided chest tube, with two remaining. No significant pneumothorax. Small, layering right pleural effusion, similar to prior examination. Small, layering left pleural effusion, likewise unchanged. Cardiomegaly. IMPRESSION: 1. Interval removal of one previously seen right-sided chest tube, with two remaining. No significant pneumothorax. 2. Small, layering right pleural effusion and small left pleural effusion,  unchanged. Electronically Signed   By: Eddie Candle M.D.   On: 05/15/2021 08:27   DG CHEST PORT 1 VIEW  Result Date: 05/14/2021 CLINICAL DATA:  Status post thoracotomy EXAM: PORTABLE CHEST 1 VIEW COMPARISON:  05/13/2021 FINDINGS: High right chest tubes remain in place, unchanged. Left PICC line is unchanged. Airspace disease throughout the right lung and left base are stable. Heart is mildly enlarged. No pneumothorax. IMPRESSION: Postoperative changes on the right with right chest tubes. No pneumothorax. Bilateral airspace disease, right greater than left, unchanged. Electronically Signed   By: Rolm Baptise M.D.   On: 05/14/2021 08:08   DG  Chest Port 1 View  Result Date: 05/13/2021 CLINICAL DATA:  Chest tube.  Status post esophageal perforation. EXAM: PORTABLE CHEST 1 VIEW COMPARISON:  05/12/2021 FINDINGS: Right chest tubes remain in place, unchanged. No pneumothorax. Volume loss on the right with airspace disease in the right mid and lower lung. Possible small right effusion. Left basilar atelectasis or infiltrate. No significant change since prior study. IMPRESSION: No significant change.  No pneumothorax. Electronically Signed   By: Rolm Baptise M.D.   On: 05/13/2021 08:25   DG Chest Port 1 View  Result Date: 05/12/2021 CLINICAL DATA:  Esophageal perforation.  Follow-up. EXAM: PORTABLE CHEST 1 VIEW COMPARISON:  May 11, 2021 FINDINGS: The cardiomediastinal silhouette is stable. There is a small left effusion with underlying atelectasis. The left lung is otherwise clear. Mediastinal drains and chest tubes are stable. The left PICC line is stable, terminating in the central SVC. No pneumothorax. A right-sided pleural effusion with underlying atelectasis is stable. There is volume loss on the right. Increased interstitial markings on the right are identified, asymmetric to the left. No other interval changes. IMPRESSION: 1. Support apparatus is stable. 2. No pneumothorax. 3. Increased interstitial markings  on the right, asymmetric to the left, are favored to represent asymmetric pulmonary edema. 4. Bilateral pleural effusions with underlying atelectasis are stable on the right and mildly more pronounced on the left in the interval. Electronically Signed   By: Dorise Bullion III M.D   On: 05/12/2021 09:13   DG CHEST PORT 1 VIEW  Result Date: 05/11/2021 CLINICAL DATA:  Status post thoracotomy. EXAM: PORTABLE CHEST 1 VIEW COMPARISON:  05/08/2021 FINDINGS: Left arm PICC line tip projects over the SVC. Stable position of right chest tubes. No pneumothorax visible. Small right pleural effusion is identified. Atelectasis noted in the left lung base, unchanged. Postoperative right rib deformities are again noted. IMPRESSION: 1. Stable position of right chest tubes. No pneumothorax. 2. Small right pleural effusion and left base atelectasis. Electronically Signed   By: Kerby Moors M.D.   On: 05/11/2021 08:29   DG CHEST PORT 1 VIEW  Result Date: 05/08/2021 CLINICAL DATA:  Status post right-sided thoracotomy for repair of perforated esophagus with chest tubes in place EXAM: PORTABLE CHEST 1 VIEW COMPARISON:  Prior chest x-ray yesterday 05/07/2021 FINDINGS: Multiple right-sided chest tubes and mediastinal drains remain in place. Left upper extremity PICC in stable position with the tip at the superior cavoatrial junction. Stable cardiac and mediastinal contours. Atherosclerotic calcifications again noted in the transverse aorta. No evidence of pneumothorax. Bibasilar atelectasis remains stable. No pulmonary edema. No acute osseous abnormality. The right IJ temporary hemodialysis catheter has been removed. IMPRESSION: 1. Interval removal of right IJ vascular catheter. 2. Otherwise stable and satisfactory support apparatus. 3. Mild bibasilar atelectasis.  No acute cardiopulmonary process. Electronically Signed   By: Jacqulynn Cadet M.D.   On: 05/08/2021 08:59   DG CHEST PORT 1 VIEW  Result Date: 05/07/2021 CLINICAL  DATA:  Status post PICC placement today. EXAM: PORTABLE CHEST 1 VIEW COMPARISON:  Single-view of the chest earlier today. FINDINGS: New left PICC is in place with its tip projecting the superior cavoatrial junction. Support tubes and lines are otherwise unchanged. Small pleural effusions and basilar atelectasis persist. No pneumothorax. Cardiomegaly. Aortic atherosclerosis. IMPRESSION: Tip of new left PICC projects at the lower superior vena cava. No change in small pleural effusions and basilar atelectasis. Cardiomegaly without edema. Aortic Atherosclerosis (ICD10-I70.0). Electronically Signed   By: Inge Rise M.D.   On:  05/07/2021 14:14   DG Chest Port 1 View  Result Date: 05/07/2021 CLINICAL DATA:  Status post thoracotomy four perforated esophagus. EXAM: PORTABLE CHEST 1 VIEW COMPARISON:  Chest x-ray 05/06/2021 FINDINGS: The endotracheal tube, NG tube, right IJ catheter, chest tubes and mediastinal drain tubes are in good position and unchanged. The external pacer paddles have been removed. Small right pleural effusion and streaky bibasilar atelectasis. No pneumothorax. IMPRESSION: 1. Stable support apparatus. 2. Small right effusion and streaky bibasilar atelectasis. No pneumothorax. Electronically Signed   By: Marijo Sanes M.D.   On: 05/07/2021 07:40   DG CHEST PORT 1 VIEW  Result Date: 05/06/2021 CLINICAL DATA:  ET and chest tubes status post thoracotomy Esophageal perforation EXAM: PORTABLE CHEST 1 VIEW COMPARISON:  05/05/2021 FINDINGS: Heart size within normal limits. No pulmonary vascular congestion. Dilated thoracic aorta again noted. Nasogastric tube extends just below the left hemidiaphragm. Multiple right chest tubes again noted. No significant residual pneumothorax is identified. There is mild bibasilar atelectasis. Temporary right hemodialysis catheter unchanged in position. Endotracheal tube tip located approximately 5 cm above the carina. IMPRESSION: 1. Lines and tubes as above. 2. No  pneumothorax. Electronically Signed   By: Miachel Roux M.D.   On: 05/06/2021 07:57   DG Chest Port 1 View  Result Date: 05/05/2021 CLINICAL DATA:  Respiratory distress EXAM: PORTABLE CHEST 1 VIEW COMPARISON:  None. FINDINGS: Support Apparatus: --Endotracheal tube: Tip at the level of the clavicular heads. --Enteric tube:Sideport projects over the distal esophagus. Recommend advancing by 7 cm. --Vascular catheter(s):Right internal jugular vein approach central venous catheter tip is at the cavoatrial junction. --Other: None Right basilar opacities. Lateral right-sided rib fractures. Cardiomediastinal contours are normal. IMPRESSION: 1. Endotracheal tube tip at the level of the clavicular heads. 2. Orogastric tube sideport projects over the distal esophagus. Recommend advancing by 7 cm. 3. Right basilar opacities, likely atelectasis and pleural effusion. Electronically Signed   By: Ulyses Jarred M.D.   On: 05/05/2021 21:51   DG Chest Port 1 View  Result Date: 05/05/2021 CLINICAL DATA:  Central line placement, altered mental status EXAM: PORTABLE CHEST 1 VIEW COMPARISON:  Radiograph 05/04/2021 FINDINGS: Right pneumothorax is similar to slightly increased from comparison prior radiography despite stable positioning of a right pleural catheter directed apically. Accounting for differences in positioning including a steep left anterior obliquity. Dual lumen right IJ approach central venous catheter tip terminates at the level of the right atrium. Endotracheal tube tip terminates 3.6 cm from the carina. Transesophageal tube tip and side port terminate beyond the GE junction, below the margins of imaging. Telemetry leads and external support devices overlie the chest. Stable cardiomegaly, cardiomediastinal contours and evidence of prior coronary stenting. Some persistent hazy opacity present in left lung base, favor atelectatic. Additional atelectatic changes and volume loss in the partially collapsed right.  IMPRESSION: Right pneumothorax is similar to slightly increased in size accounting for differences in patient positioning. This is despite the continued presence of a right pleural catheter. Additional lines and tubes as above. Bibasilar hazy opacities, favoring atelectatic change with additional volume loss in the partially collapsed right lung. Stable cardiomegaly, coronary and aortic atherosclerosis. Prior coronary stenting. Electronically Signed   By: Lovena Le M.D.   On: 05/05/2021 02:23   DG Chest Port 1 View  Result Date: 05/04/2021 CLINICAL DATA:  Pneumothorax EXAM: PORTABLE CHEST 1 VIEW COMPARISON:  05/04/2021 at 4:17 p.m. FINDINGS: Unchanged appearance of endotracheal tube and esophageal catheter. The right pneumothorax is decreased in size following placement  a right chest wall catheter. Left lung is clear. IMPRESSION: Decrease in size of right pneumothorax following chest wall catheter placement. Electronically Signed   By: Ulyses Jarred M.D.   On: 05/04/2021 22:33   DG Chest Port 1 View  Result Date: 05/04/2021 CLINICAL DATA:  sob EXAM: PORTABLE CHEST 1 VIEW COMPARISON:  March 29, 2021 FINDINGS: Evaluation is limited secondary to mode tubal overlying lines. The cardiomediastinal silhouette is enlarged in contour.Atherosclerotic calcifications of the aorta. Large RIGHT pleural effusion. ETT tip terminates over the RIGHT mainstem bronchus. The enteric tube courses through the chest to the abdomen beyond the field-of-view. No pneumothorax. Bibasilar homogeneous opacities, likely atelectasis. Visualized abdomen is unremarkable. Multilevel degenerative changes of the thoracic spine. IMPRESSION: 1.  ETT tip terminates over the RIGHT mainstem bronchus. 2.  Large loculated appearing RIGHT-sided pleural effusion. These results were called by telephone at the time of interpretation on 05/04/2021 at 4:29 pm to provider [facility] , who verbally acknowledged these results. Electronically Signed   By:  Valentino Saxon MD   On: 05/04/2021 16:31   DG Swallowing Func-Speech Pathology  Result Date: 05/24/2021 Table formatting from the original result was not included. Objective Swallowing Evaluation: Type of Study: MBS-Modified Barium Swallow Study  Patient Details Name: Dana Coleman MRN: [id number] Date of Birth: 1941/01/31 Today's Date: 05/24/2021 Time: SLP Start Time (ACUTE ONLY): 1135 -SLP Stop Time (ACUTE ONLY): [id number] SLP Time Calculation (min) (ACUTE ONLY): 23 min Past Medical History: Past Medical History: Diagnosis Date  Allergy   occ uses OTC allergy meds   Arthritis   fingers  Breast cancer ([place]) 04/2008  Right breast  Cataracts, bilateral   removed bilat   Cervical cancer ([place])   When the patient was in her 17s  Depression   Diabetes mellitus without complication ([place])   Heart attack ([place]) 2004  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  Hyperlipidemia   Hypertension   Personal history of radiation therapy 2009  Respiratory failure ([place]) 05/04/2021 Past Surgical History: Past Surgical History: Procedure Laterality Date  BREAST LUMPECTOMY Right 2009  BREAST SURGERY  2009  right lumpectomy  [place] N/A 05/02/2021  Procedure: LAPAROSCOPIC CHOLECYSTECTOMY;  Surgeon: Greer Pickerel, MD;  Location: [facility] ORS;  Service: General;  Laterality: N/A;  COLONOSCOPY    LEFT HEART CATHETERIZATION WITH CORONARY ANGIOGRAM N/A 04/20/2014  Procedure: LEFT HEART CATHETERIZATION WITH CORONARY ANGIOGRAM;  Surgeon: Jacolyn Reedy, MD;  Location: [facility] CATH LAB;  Service: Cardiovascular;  Laterality: N/A;  POLYPECTOMY    STENT PLACEMENT VASCULAR ([place] HX)    THORACOTOMY Right 05/05/2021  Procedure: THORACOTOMY MAJOR REPAIR PERFORATED ESOPHAGUS;  Surgeon: Gaye Pollack, MD;  Location: [facility] OR;  Service: Thoracic;  Laterality: Right;  thyroid    for nodule hemithyroidectomy, benign  THYROIDECTOMY Right 03/2003 HPI: Pt is a 80 yo female admitted on 05/04/21 for AMS. HX of cholecystectomy on 7/21.  Intubated upon  admission, 7/24 Started CRRT, Esophageal perforation with septic shock post flap repair, drainage of empyema and decortication of R lung with chest tube placed.  7/25 EKG changes and probable nstemi. Extubated 7/26. Esophagram 8/1 cleared to start PO diet. CT removed 8/4. Pt with concern for aspiration 8/8 and made NPO. Pt also with new PE with R heart strain and increased O2 HFNC requirements 8/8. PMH includes: R breast CA, DM, CAD, HTN.  Subjective: pt alert but generally weak Assessment / Plan / Recommendation CHL IP CLINICAL IMPRESSIONS 05/24/2021 Clinical Impression Pt has a mild oral  dysphagia felt to be primarily related to generalized deconditioning and fatigue. She needs rest breaks between boluses and has some increase in shortness of breath. She has reduced lingual propulsion, piecemeal swallowing, and prolonged mastication, although it should also be noted that she did not have her dentures placed for this study as they did not come down from her room. She tired quickly with solids and needs liquid washes to clear her oral cavity. Thin liquids are spilled back toward her pharynx as she is working on oral transit, with swallow typically triggering at the pyriform sinuses, but she maintains good airway protection throughout testing. No aspiration or pharyngeal residue is noted. Pt verbalized that she would prefer a softer diet to conserve her energy. Options were reviewed with her, and she would like to start with a pureed diet with thin liquids. Her prognosis for advancement is good as her overall energy levels improve, and likely with placement of her dentures as well. Would optimize upright positioning and take frequent breaks PRN for respirations. SLP Visit Diagnosis Dysphagia, oral phase (R13.11) Attention and concentration deficit following -- Frontal lobe and executive function deficit following -- Impact on safety and function Mild aspiration risk   CHL IP TREATMENT RECOMMENDATION 05/24/2021  Treatment Recommendations Therapy as outlined in treatment plan below   Prognosis 05/24/2021 Prognosis for Safe Diet Advancement Good Barriers to Reach Goals -- Barriers/Prognosis Comment -- CHL IP DIET RECOMMENDATION 05/24/2021 SLP Diet Recommendations Dysphagia 1 (Puree) solids;Thin liquid Liquid Administration via Cup;Straw Medication Administration Crushed with puree Compensations Slow rate;Small sips/bites Postural Changes Seated upright at 90 degrees;Remain semi-upright after after feeds/meals (Comment)   CHL IP OTHER RECOMMENDATIONS 05/24/2021 Recommended Consults -- Oral Care Recommendations Oral care BID Other Recommendations --   CHL IP FOLLOW UP RECOMMENDATIONS 05/24/2021 Follow up Recommendations Inpatient Rehab   CHL IP FREQUENCY AND DURATION 05/24/2021 Speech Therapy Frequency (ACUTE ONLY) min 2x/week Treatment Duration 2 weeks      CHL IP ORAL PHASE 05/24/2021 Oral Phase Impaired Oral - Pudding Teaspoon -- Oral - Pudding Cup -- Oral - Honey Teaspoon -- Oral - Honey Cup -- Oral - Nectar Teaspoon -- Oral - Nectar Cup -- Oral - Nectar Straw -- Oral - Thin Teaspoon -- Oral - Thin Cup Delayed oral transit;Piecemeal swallowing Oral - Thin Straw Delayed oral transit;Piecemeal swallowing Oral - Puree Delayed oral transit;Piecemeal swallowing Oral - Mech Soft -- Oral - Regular Delayed oral transit;Piecemeal swallowing;Impaired mastication;Lingual/palatal residue Oral - Multi-Consistency -- Oral - Pill Delayed oral transit;Lingual/palatal residue Oral Phase - Comment --  CHL IP PHARYNGEAL PHASE 05/24/2021 Pharyngeal Phase WFL Pharyngeal- Pudding Teaspoon -- Pharyngeal -- Pharyngeal- Pudding Cup -- Pharyngeal -- Pharyngeal- Honey Teaspoon -- Pharyngeal -- Pharyngeal- Honey Cup -- Pharyngeal -- Pharyngeal- Nectar Teaspoon -- Pharyngeal -- Pharyngeal- Nectar Cup -- Pharyngeal -- Pharyngeal- Nectar Straw -- Pharyngeal -- Pharyngeal- Thin Teaspoon -- Pharyngeal -- Pharyngeal- Thin Cup -- Pharyngeal -- Pharyngeal- Thin  Straw -- Pharyngeal -- Pharyngeal- Puree -- Pharyngeal -- Pharyngeal- Mechanical Soft -- Pharyngeal -- Pharyngeal- Regular -- Pharyngeal -- Pharyngeal- Multi-consistency -- Pharyngeal -- Pharyngeal- Pill -- Pharyngeal -- Pharyngeal Comment --  CHL IP CERVICAL ESOPHAGEAL PHASE 05/24/2021 Cervical Esophageal Phase WFL Pudding Teaspoon -- Pudding Cup -- Honey Teaspoon -- Honey Cup -- Nectar Teaspoon -- Nectar Cup -- Nectar Straw -- Thin Teaspoon -- Thin Cup -- Thin Straw -- Puree -- Mechanical Soft -- Regular -- Multi-consistency -- Pill -- Cervical Esophageal Comment -- Osie Bond., M.A. [place] Acute Environmental education officer [phone] Office (  [phone] 05/24/2021, 1:50 PM              ECHOCARDIOGRAM COMPLETE  Result Date: 05/06/2021    ECHOCARDIOGRAM REPORT   Patient Name:   Dana Coleman Date of Exam: 05/06/2021 Medical Rec #:  [id number]          Height:       61.0 in Accession #:    [id number]         Weight:       127.9 lb Date of Birth:  02-Jul-1941           BSA:          1.562 m Patient Age:    54 years           BP:           97/42 mmHg Patient Gender: F                  HR:           64 bpm. Exam Location:  Inpatient Procedure: 2D Echo, Color Doppler, Cardiac Doppler and Intracardiac            Opacification Agent Indications:    Abnormal ECG R94.31  History:        Patient has prior history of Echocardiogram examinations, most                 recent 12/12/2017. Arrythmias:Bradycardia and Atrial Fibrillation;                 Risk Factors:Diabetes and Dyslipidemia. Breast Cancer. Acute                 kidney injury. Septic shock.  Sonographer:    Darlina Sicilian RDCS Referring Phys: [id number] [place]  1. Left ventricular ejection fraction, by estimation, is 45%. The left ventricle has mildly decreased function. The left ventricle demonstrates regional wall motion abnormalities with basal to mid inferolateral and basal inferior severe hypokinesis. There is mild left ventricular  hypertrophy. Left ventricular diastolic parameters are consistent with Grade I diastolic dysfunction (impaired relaxation).  2. Right ventricular systolic function is severely reduced. The right ventricular size is mildly enlarged. Mildly increased right ventricular wall thickness. There is normal pulmonary artery systolic pressure. The estimated right ventricular systolic pressure is [id number] mmHg.  3. Right atrial size was mildly dilated.  4. The mitral valve is normal in structure. No evidence of mitral valve regurgitation. No evidence of mitral stenosis.  5. The aortic valve is tricuspid. Aortic valve regurgitation is not visualized. Mild aortic valve sclerosis is present, with no evidence of aortic valve stenosis.  6. Aortic dilatation noted. There is borderline dilatation of the aortic root, measuring 37 mm.  7. The inferior vena cava is normal in size with <50% respiratory variability, suggesting right atrial pressure of 8 mmHg. FINDINGS  Left Ventricle: Left ventricular ejection fraction, by estimation, is 45%. The left ventricle has mildly decreased function. The left ventricle demonstrates regional wall motion abnormalities. The left ventricular internal cavity size was normal in size. There is mild left ventricular hypertrophy. Left ventricular diastolic parameters are consistent with Grade I diastolic dysfunction (impaired relaxation). Right Ventricle: The right ventricular size is mildly enlarged. Mildly increased right ventricular wall thickness. Right ventricular systolic function is severely reduced. There is normal pulmonary artery systolic pressure. The tricuspid regurgitant velocity is 2.06 m/s, and with an assumed right atrial pressure of 8 mmHg, the estimated right ventricular systolic pressure is  25.0 mmHg. Left Atrium: Left atrial size was normal in size. Right Atrium: Right atrial size was mildly dilated. Pericardium: There is no evidence of pericardial effusion. Mitral Valve: The mitral valve is  normal in structure. There is mild calcification of the mitral valve leaflet(s). Mild mitral annular calcification. No evidence of mitral valve regurgitation. No evidence of mitral valve stenosis. Tricuspid Valve: The tricuspid valve is normal in structure. Tricuspid valve regurgitation is trivial. Aortic Valve: The aortic valve is tricuspid. Aortic valve regurgitation is not visualized. Mild aortic valve sclerosis is present, with no evidence of aortic valve stenosis. Pulmonic Valve: The pulmonic valve was normal in structure. Pulmonic valve regurgitation is trivial. Aorta: Aortic dilatation noted. There is borderline dilatation of the aortic root, measuring 37 mm. Venous: The inferior vena cava is normal in size with less than 50% respiratory variability, suggesting right atrial pressure of 8 mmHg. IAS/Shunts: No atrial level shunt detected by color flow Doppler.  LEFT VENTRICLE PLAX 2D LVIDd:         3.90 cm LVIDs:         3.20 cm LV PW:         0.90 cm LV IVS:        1.50 cm LVOT diam:     2.15 cm LV SV:         46 LV SV Index:   30 LVOT Area:     3.63 cm  RIGHT VENTRICLE TAPSE (M-mode): 1.3 cm LEFT ATRIUM             Index       RIGHT ATRIUM           Index LA diam:        2.20 cm 1.41 cm/m  RA Area:     12.30 cm LA Vol (A2C):   15.0 ml 9.60 ml/m  RA Volume:   24.40 ml  15.62 ml/m LA Vol (A4C):   20.2 ml 12.97 ml/m LA Biplane Vol: 16.3 ml 10.44 ml/m  AORTIC VALVE             PULMONIC VALVE LVOT Vmax:   101.65 cm/s PR End Diast Vel: 1.78 msec LVOT Vmean:  64.150 cm/s LVOT VTI:    0.128 m  AORTA Ao Root diam: 3.70 cm Ao Asc diam:  3.60 cm MITRAL VALVE               TRICUSPID VALVE MV Area (PHT): 3.31 cm    TR Peak grad:   17.0 mmHg MV Decel Time: 229 msec    TR Vmax:        206.00 cm/s MV E velocity: 60.10 cm/s                            SHUNTS                            Systemic VTI:  0.13 m                            Systemic Diam: 2.15 cm Loralie Champagne MD Electronically signed by Loralie Champagne MD  Signature Date/Time: 05/06/2021/4:08:32 PM    Final    VAS [place] LOWER EXTREMITY VENOUS (DVT)  Result Date: 05/21/2021  Lower Venous DVT Study Patient Name:  Dana Coleman  Date of Exam:   05/21/2021 Medical Rec #: [id number]  Accession #:    [id number] Date of Birth: 11/12/1940            Patient Gender: F Patient Age:   59 years Exam Location:  [facility] Procedure:      VAS [place] LOWER EXTREMITY VENOUS (DVT) Referring Phys: RAKESH ALVA --------------------------------------------------------------------------------  Indications: Pulmonary embolism.  Risk Factors: None identified. Limitations: Poor ultrasound/tissue interface. Comparison Study: No prior studies. Performing Technologist: Oliver Hum RVT  Examination Guidelines: A complete evaluation includes B-mode imaging, spectral Doppler, color Doppler, and power Doppler as needed of all accessible portions of each vessel. Bilateral testing is considered an integral part of a complete examination. Limited examinations for reoccurring indications may be performed as noted. The reflux portion of the exam is performed with the patient in reverse Trendelenburg.  +---------+---------------+---------+-----------+----------+--------------+ RIGHT    CompressibilityPhasicitySpontaneityPropertiesThrombus Aging +---------+---------------+---------+-----------+----------+--------------+ CFV      Full           Yes      Yes                                 +---------+---------------+---------+-----------+----------+--------------+ SFJ      Full                                                        +---------+---------------+---------+-----------+----------+--------------+ FV Prox  Full                                                        +---------+---------------+---------+-----------+----------+--------------+ FV Mid   Full                                                         +---------+---------------+---------+-----------+----------+--------------+ FV DistalFull                                                        +---------+---------------+---------+-----------+----------+--------------+ PFV      Full                                                        +---------+---------------+---------+-----------+----------+--------------+ POP      Full           Yes      Yes                                 +---------+---------------+---------+-----------+----------+--------------+ PTV      Full                                                        +---------+---------------+---------+-----------+----------+--------------+  PERO     Full                                                        +---------+---------------+---------+-----------+----------+--------------+   +---------+---------------+---------+-----------+----------+--------------+ LEFT     CompressibilityPhasicitySpontaneityPropertiesThrombus Aging +---------+---------------+---------+-----------+----------+--------------+ CFV      Full           Yes      Yes                                 +---------+---------------+---------+-----------+----------+--------------+ SFJ      Full                                                        +---------+---------------+---------+-----------+----------+--------------+ FV Prox  Full                                                        +---------+---------------+---------+-----------+----------+--------------+ FV Mid                  Yes      Yes                                 +---------+---------------+---------+-----------+----------+--------------+ FV DistalFull                                                        +---------+---------------+---------+-----------+----------+--------------+ PFV      Full                                                         +---------+---------------+---------+-----------+----------+--------------+ POP      Full           Yes      Yes                                 +---------+---------------+---------+-----------+----------+--------------+ PTV      Full                                                        +---------+---------------+---------+-----------+----------+--------------+ PERO     Full                                                        +---------+---------------+---------+-----------+----------+--------------+  Summary: RIGHT: - There is no evidence of deep vein thrombosis in the lower extremity. However, portions of this examination were limited- see technologist comments above.  - No cystic structure found in the popliteal fossa.  LEFT: - There is no evidence of deep vein thrombosis in the lower extremity. However, portions of this examination were limited- see technologist comments above.  - No cystic structure found in the popliteal fossa.  *See table(s) above for measurements and observations. Electronically signed by Ruta Hinds MD on 05/21/2021 at 5:30:46 PM.    Final    ECHOCARDIOGRAM LIMITED  Result Date: 05/21/2021    ECHOCARDIOGRAM LIMITED REPORT   Patient Name:   Dana Coleman Date of Exam: 05/21/2021 Medical Rec #:  [id number]          Height:       61.0 in Accession #:    [id number]         Weight:       145.5 lb Date of Birth:  Mar 10, 1941           BSA:          1.650 m Patient Age:    69 years           BP:           124/77 mmHg Patient Gender: F                  HR:           100 bpm. Exam Location:  Inpatient Procedure: Limited Echo, Cardiac Doppler, Limited Color Doppler and Intracardiac            Opacification Agent                   STAT ECHO Reported to: Dr Marlou Porch on 05/20/2021 8:20:00 AM                  Stat order. Indications:    Dyspnea  History:        Patient has prior history of Echocardiogram examinations, most                 recent 05/06/2021. Previous Myocardial  Infarction; Risk                 Factors:Diabetes, Dyslipidemia and Hypertension.  Sonographer:    Georganna Skeans RDCS Referring Phys: [id number] Tawanna Solo T ENIOLA IMPRESSIONS  1. Left ventricular ejection fraction, by estimation, is 25 to 30%. The left ventricle has severely decreased function. The left ventricle demonstrates regional wall motion abnormalities (see scoring diagram/findings for description). There is mild left  ventricular hypertrophy.  2. Right ventricular systolic function is normal. The right ventricular size is normal. There is mildly elevated pulmonary artery systolic pressure. The estimated right ventricular systolic pressure is [id number] mmHg.  3. The mitral valve is normal in structure. Mild mitral valve regurgitation. No evidence of mitral stenosis.  4. Tricuspid valve regurgitation is moderate.  5. The aortic valve is tricuspid. Aortic valve regurgitation is not visualized. Mild aortic valve sclerosis is present, with no evidence of aortic valve stenosis.  6. The inferior vena cava is normal in size with greater than 50% respiratory variability, suggesting right atrial pressure of 3 mmHg. Comparison(s): Prior images unable to be directly viewed, comparison made by report only. The left ventricular function is worsened. FINDINGS  Left Ventricle: Left ventricular ejection fraction, by estimation, is 25 to 30%. The left ventricle has severely decreased function. The left ventricle  demonstrates regional wall motion abnormalities. Definity contrast agent was given IV to delineate the left ventricular endocardial borders. The left ventricular internal cavity size was normal in size. There is mild left ventricular hypertrophy.  LV Wall Scoring: The mid and distal anterior septum, entire apex, mid and distal inferior wall, and mid inferoseptal segment are akinetic. Right Ventricle: The right ventricular size is normal. No increase in right ventricular wall thickness. Right ventricular systolic function is  normal. There is mildly elevated pulmonary artery systolic pressure. The tricuspid regurgitant velocity is 2.94  m/s, and with an assumed right atrial pressure of 8 mmHg, the estimated right ventricular systolic pressure is [id number] mmHg. Left Atrium: Left atrial size was normal in size. Right Atrium: Right atrial size was normal in size. Pericardium: There is no evidence of pericardial effusion. Mitral Valve: The mitral valve is normal in structure. Mild mitral valve regurgitation. No evidence of mitral valve stenosis. Tricuspid Valve: The tricuspid valve is normal in structure. Tricuspid valve regurgitation is moderate . No evidence of tricuspid stenosis. Aortic Valve: The aortic valve is tricuspid. Aortic valve regurgitation is not visualized. Aortic regurgitation PHT measures 774 msec. Mild aortic valve sclerosis is present, with no evidence of aortic valve stenosis. Aortic valve mean gradient measures 5.0 mmHg. Aortic valve peak gradient measures 11.0 mmHg. Pulmonic Valve: The pulmonic valve was normal in structure. Pulmonic valve regurgitation is trivial. No evidence of pulmonic stenosis. Aorta: The aortic root is normal in size and structure. Venous: The inferior vena cava is normal in size with greater than 50% respiratory variability, suggesting right atrial pressure of 3 mmHg. IAS/Shunts: No atrial level shunt detected by color flow Doppler. LEFT VENTRICLE PLAX 2D LVIDd:         4.70 cm LVIDs:         3.70 cm LV PW:         0.70 cm LV IVS:        1.30 cm LVOT diam:     2.10 cm LVOT Area:     3.46 cm  LV Volumes (MOD) LV vol d, MOD A2C: 81.4 ml LV vol d, MOD A4C: 65.0 ml LV vol s, MOD A2C: 51.0 ml LV vol s, MOD A4C: 46.9 ml LV SV MOD A2C:     30.4 ml LV SV MOD A4C:     65.0 ml LV SV MOD BP:      26.5 ml RIGHT VENTRICLE RV Basal diam:  3.50 cm RV Mid diam:    2.10 cm RV S prime:     7.18 cm/s TAPSE (M-mode): 0.7 cm LEFT ATRIUM             Index       RIGHT ATRIUM           Index LA Vol (A2C):   53.5 ml 32.43  ml/m RA Area:     12.70 cm LA Vol (A4C):   30.9 ml 18.73 ml/m RA Volume:   28.70 ml  17.39 ml/m LA Biplane Vol: 41.8 ml 25.33 ml/m  AORTIC VALVE               PULMONIC VALVE AV Vmax:      166.00 cm/s  PV Vmax:       1.02 m/s AV Vmean:     101.000 cm/s PV Vmean:      68.100 cm/s AV VTI:       0.223 m      PV VTI:  0.174 m AV Peak Grad: 11.0 mmHg    PV Peak grad:  4.2 mmHg AV Mean Grad: 5.0 mmHg     PV Mean grad:  2.0 mmHg AI PHT:       774 msec  AORTA Ao Root diam: 3.80 cm Ao Asc diam:  3.80 cm MR Peak grad: 70.9 mmHg   TRICUSPID VALVE MR Vmax:      421.00 cm/s TR Peak grad:   34.6 mmHg                           TR Vmax:        294.00 cm/s                            SHUNTS                           Systemic Diam: 2.10 cm Candee Furbish MD Electronically signed by Candee Furbish MD Signature Date/Time: 05/21/2021/11:06:49 AM    Final    [place] EKG SITE RITE  Result Date: 05/07/2021 If Site Rite image not attached, placement could not be confirmed due to current cardiac rhythm.  DG ESOPHAGUS W SINGLE CM (SOL OR THIN BA)  Result Date: 05/13/2021 CLINICAL DATA:  Previously, now repaired. EXAM: ESOPHOGRAM/BARIUM SWALLOW TECHNIQUE: Single contrast examination was performed using thin barium in water-soluble. FLUOROSCOPY TIME:  Fluoroscopy Time:  3 minutes 24 seconds Radiation Exposure Index (if provided by the fluoroscopic device): 51.4 mGy Number of Acquired Spot Images: 10 COMPARISON:  Previous esophagram showing leak in the mid esophagus. FINDINGS: Water-soluble contrast was first administered showing esophageal dysmotility. There is a smooth outpouching from the esophagus just below the previous site of leak or expected location of the previous site of leak. Dysmotility was exhibited on the prior study. There is no signs of extravasation from the esophagus. Thin barium was administered. With both thin barium and water-soluble contrast images were obtained in the double oblique position both LPO and RPO.  IMPRESSION: No sign of leak. Mild irregularity of the mid esophagus may reflect changes of repair. Smooth outpouching just below the expected site of perforation and repair may be related to dysmotility or atypical pulsion diverticulum. Electronically Signed   By: Zetta Bills M.D.   On: 05/13/2021 11:24   DG ESOPHAGUS W SINGLE CM (SOL OR THIN BA)  Result Date: 05/05/2021 CLINICAL DATA:  Pneumothorax and clinical concern for esophageal leak. EXAM: ESOPHOGRAM/BARIUM SWALLOW TECHNIQUE: Single contrast examination was performed using water-soluble contrast material. FLUOROSCOPY TIME:  Fluoroscopy Time:  3 minutes and 6 seconds. Radiation Exposure Index (if provided by the fluoroscopic device): 106 mGy Number of Acquired Spot Images: COMPARISON:  Chest abdomen pelvis CT 05/04/2021 FINDINGS: Initial fluoroscopy of the mediastinum shows no residual contrast material from CT scan yesterday. Patient had an existing OG tube in situ. OG tube was pulled back so that the distal tip was in the region of the esophagogastric junction, and the proximal side port in the distal third of the esophagus. Water-soluble contrast was then introduced via the OG tube into the esophageal lumen. With initial injection, a thin right paraesophageal line of extraluminal contrast material appears, approximately 4 cm below the level of the carina. Approximately 50 cc of contrast was introduced via the OG tube. With continued observation, no continued flow of extraluminal contrast was observed and no frank migration of contrast  into the right pleural space could be identified. Subsequently, the patient was turned LPO which revealed a more prominent finger-like projection of extraluminal contrast posterior to the esophagus and tracking to the right. Continued observation showed no free flow of this contrast material into the right pleural space. The OG tube was then flushed with water and repositioned so that the tip was in the mid stomach,  placing the proximal port well below the EG junction. The patient's nurse then taped the OG tube back in place. IMPRESSION: Contained finger-like projection of extraluminal contrast arising from the posterior/right posterolateral esophagus approximately 1-2 cm distal to the carina. This extraluminal collection tracks caudally for a total distance of 4-5 cm, but no frank migration of contrast into the right pleural space is identified on this study. Critical Value/emergent results were called by telephone at the time of interpretation on 05/05/2021 at approximately 12:15 pm to provider [facility] , who verbally acknowledged these results. Electronically Signed   By: Misty Stanley M.D.   On: 05/05/2021 12:20    Labs:  CBC: Recent Labs    05/23/21 0434 05/24/21 0500 05/27/21 0410 05/31/21 0603  WBC 11.0* 11.1* 9.3 10.8*  HGB 9.6* 9.8* 10.1* 10.1*  HCT 29.3* 29.9* 31.4* 31.0*  PLT 357 377 323 270    COAGS: Recent Labs    05/05/21 0001 05/05/21 0803 05/30/21 2101  INR 1.9* 1.9*  --   APTT 24 31 142*    BMP: Recent Labs    08/14/20 1415 01/21/21 1115 05/28/21 0530 05/29/21 0500 05/30/21 0426 05/31/21 0603  NA 141   < > 134* 137 137 135  K 4.5   < > 4.2 4.3 4.5 4.2  CL 103   < > 100 103 103 102  CO2 24   < > '27 29 27 26  '$ GLUCOSE 129*   < > 99 79 84 108*  BUN 10   < > 30* 27* 28* 30*  CALCIUM 9.6   < > 8.7* 8.7* 8.6* 8.5*  CREATININE 0.89   < > 0.53 0.56 0.62 0.58  GFRNONAA 62   < > >60 >60 >60 >60  GFRAA 72  --   --   --   --   --    < > = values in this interval not displayed.    LIVER FUNCTION TESTS: Recent Labs    05/22/21 0554 05/23/21 0434 05/24/21 0500 05/25/21 0213  BILITOT 0.6 0.4 0.4 0.4  AST 32 '28 28 28  '$ ALT 37 '30 27 26  '$ ALKPHOS 70 65 62 59  PROT 6.2* 5.8* 5.9* 6.0*  ALBUMIN 2.0* 1.9* 1.8* 1.9*     Assessment and Plan:  80 y.o. female inpatient. History of  breast cancer, DM, CAD, HTN. cholecystectomy on 7.21.22. Admitted on 7.23.22 for hypoxemia  found to have a large right sided empyema s/p right thoracotomy  and decortication and  repair esophageal perforation with muscle flap. On 7.24.22. NSTEMI on 7.25. Found to be in a fib with RVR and a PE with right heart strain on 8.8.22.  Team is requesting a g tube placement due to malnutrition and dysphagia.   CT chest 8.8.22. Modified swallow study shows mild aspiration. WBC is 10.8, BUN 28 Patient is on a heparin gtt. Eliquis last dose on 8.18.22.NKDA.   Risks and benefits image guided gastrostomy tube placement was discussed with the patient including, but not limited to the need for a barium enema during the procedure, bleeding, infection, peritonitis and/or damage to  adjacent structures.  All of the patient's questions were answered, patient is agreeable to proceed.  Consent signed and in chart.    Thank you for this interesting consult.  I greatly enjoyed meeting Dana Coleman and look forward to participating in their care.  A copy of this report was sent to the requesting provider on this date.  Electronically Signed: Jacqualine Mau, NP 05/31/2021, 10:16 AM   I spent a total of 40 Minutes    in face to face in clinical consultation, greater than 50% of which was counseling/coordinating care for g tube placement.

## 2021-05-31 NOTE — Progress Notes (Signed)
Family Medicine Teaching Service Daily Progress Note Intern Pager: [phone]  Patient name: Dana Coleman Medical record number: [id number] Date of birth: September 04, 1941 Age: 80 y.o. Gender: female  Primary Care Provider: Gifford Shave, MD Consultants: Gastroenterology, CVTS, Pulmonology, Cardiology Code Status: Partial code  Pt Overview and Major Events to Date:  LOS: 27 days  7/23 admitted to ICU, intubated, right pigtail catheter placed 7/24- CRRT, emergent right thoracotomy, repair of esophageal perforation with intercostal pedicled muscle flap and drainage of empyema and decortication of right lung 7/25- NSTEMI, no intervention 7/26- extubated, TPN started 8/1- esophagram without leak, PO started, heparin gtt d/c because of increased bleeding from chest tubes. Transfused 1u pRBC 8/3: anterior chest tube removed 8/4: remaining chest tube removed 8/8: increased O2 requirement, Afib RVR -> PE with right heart strain 8/9: further increased O2 requirement, Afib 8/13: started Entresto, decreasing supplemental oxygen requirement   Abx timeline Zosyn: 7/23-7/26 Eraxis: 7/23-8/12 Unasyn: 7/26-7/31 Meropenem: 7/31-8/3 Ceftriaxone: 8/3-8/11 Metronidazole: 8/3-8/11   Micro Timeline 7/23: MRSA PCR negative 7/23: pleural fluid grew strep salivarius pan-sensitive, strep mitas (sensitive CTX), rare candida albicans 7/24: blood cultures negative x2  Assessment and Plan: [name] is a 80 year old female admitted for empyema followed by esophageal perforation (s/p repair) with new atrial fibrillation, s/p NSTEMI, new onset HF and PE, now with major issue of malnutrition and deconditioning.  Malnutrition, Protein/Calorie Not improving. Awaiting IR consultation for G/ G-J tube. Overnight, received zofran x1 for fear of nausea related to receiving PO lopressor and entresto. No episodes of vomiting overnight. She refused Ensure yesterday afternoon. Appears severely weak and  deconditioned. - IR consulted, greatly appreciate recommendations WBC 10.8, Hgb 10.1 - Continue PT - Feeding assistance, Ensure TID between meals, encourage PO intake - Calorie count - Continue TPN  - Continue Pantoprazole '40mg'$  daily - SLP  Pulmonary Embolism Placed on 2L [place] as she was satting at 91% with tachycardia (102-110 bpm) and tachypneic (20-26 RR) overnight. Per RN, she was not in respiratory distress. No chest pain. No dyspnea or shortness of breath this morning. - Wean Oxygen as toleraated - Continue Heparin, dosaging per pharmacy - Monitor respiratory status - Tylenol q4h PRN  Atrial fibrillation Stable. HR 100-110 bpm  overnight - Continue metoprolol BID  T2DM CBG ranges past 24 hours: 115-160. No hypoglycemic episodes - Monitor with CBG q4 hours - SSI very sensitive   FEN/GI: Dysphagia 2 diet PPx: Heparin Dispo:SNF pending clinical improvement . Barriers include G/GJ tube placement.   Subjective:  Ms. Consoli rested well overnight. She does not feel like eating. She has nausea, but no vomiting. She says the ensure does not make her nauseous. No diarrhea. No vomiting overnight. She says PT is going better.   Objective: Temp:  [98.2 F (36.8 C)-98.8 F (37.1 C)] 98.4 F (36.9 C) (08/19 0328) Pulse Rate:  [105-110] 105 (08/18 1612) Resp:  [20-24] 20 (08/19 0328) BP: (124-135)/(78-87) 124/78 (08/19 0328) SpO2:  [91 %-97 %] 95 % (08/19 0328) Weight:  [71.2 kg] 71.2 kg (08/19 0328) Physical Exam: General: Pleasant, weak but well-appearing, in no distress, laying in bed. Smiles and converses with me Cardiovascular: RRR Respiratory: Continued but stable diminished right breath sounds. On 2L [place], with no increased work of breathing or shortness of breath. No wheezing or crackles Abdomen: Soft, non-tender, non-distended Extremities: BL lower extremities without edema. Warm, dry, well perfused  Laboratory: Recent Labs  Lab 05/27/21 0410 05/31/21 0603  WBC 9.3  10.8*  HGB 10.1* 10.1*  HCT 31.4* 31.0*  PLT 323 270   Recent Labs  Lab 05/25/21 0213 05/26/21 0055 05/29/21 0500 05/30/21 0426 05/31/21 0603  NA 135   < > 137 137 135  K 4.0   < > 4.3 4.5 4.2  CL 106   < > 103 103 102  CO2 20*   < > '29 27 26  '$ BUN 39*   < > 27* 28* 30*  CREATININE 0.78   < > 0.56 0.62 0.58  CALCIUM 8.5*   < > 8.7* 8.6* 8.5*  PROT 6.0*  --   --   --   --   BILITOT 0.4  --   --   --   --   ALKPHOS 59  --   --   --   --   ALT 26  --   --   --   --   AST 28  --   --   --   --   GLUCOSE 115*   < > 79 84 108*   < > = values in this interval not displayed.    Imaging/Diagnostic Tests: No results found.   Orvis Brill, DO 05/31/2021, 7:05 AM PGY-1, [place] Intern pager: [phone], text pages welcome

## 2021-05-31 NOTE — Progress Notes (Signed)
Physical Therapy Treatment Patient Details Name: Dana Coleman MRN: [id number] DOB: 1940-10-25 Today's Date: 05/31/2021    History of Present Illness Pt is a 80 yo female admitted on 05/04/21 for AMS. HX of cholecystectomy on 7/21.  Intubated upon admission, 7/24 Started CRRT, Esophageal perforation with septic shock post flap repair, drainage of empyema and decortication of R lung with chest tube placed.  7/25 EKG changes and probable nstemi. Extubated 7/26. CT removed 8/4. Pt with new PE with R heart strain and increased O2 HFNC requirements 8/8. Plan for possible G-tube placement 8/19. PMH includes: R breast CA, DM, CAD, HTN.    PT Comments    Pt received in supine, agreeable to therapy session with encouragement and with good participation and fair tolerance for transfer to EOB and sit<>stand transfer training. Pt limited due to bowel incontinence, quick to fatigue and c/o discomfort from elevated respiratory rate (RR 27 rpm with exertion) but participatory as able. Pt needing increased physical assist (modA for bed mobility and single standing transfer from EOB) this date and also needed 1L HF [place] to keep SpO2 WFL. Pt had desat prior to PTA entrance to 84% on RA but once on 1L remains [facility] throughout. BP stable with transfers. Pt continues to benefit from PT services to progress toward functional mobility goals. DC recs below remain appropriate, pt c/o severe fatigue after transition to EOB and single brief stand at RW.  Follow Up Recommendations  SNF;Supervision for mobility/OOB     Equipment Recommendations  Rolling walker with 5" wheels;Wheelchair (measurements PT);Wheelchair cushion (measurements PT)    Recommendations for Other Services       Precautions / Restrictions Precautions Precautions: Fall Precaution Comments: elevated RR/SpO2 desat Restrictions Weight Bearing Restrictions: No    Mobility  Bed Mobility Overal bed mobility: Needs Assistance Bed Mobility:  Rolling;Sidelying to Sit;Sit to Sidelying Rolling: Min assist Sidelying to sit: HOB elevated;Mod assist (HOB elevated ~20 deg)     Sit to sidelying: Min assist;HOB elevated General bed mobility comments: cues for log roll sequencing, pt needs assist to get BLE in flexed position prior to rolling and step-by-step cues for sequencing/use of bed rails and technique    Transfers Overall transfer level: Needs assistance Equipment used: Rolling walker (2 wheeled) Transfers: Sit to/from Stand Sit to Stand: Mod assist         General transfer comment: from EOB<>RW, pt needs verbal and tactile cues for safe hand placement prior to standing and modA lift assist; incontinent of bowels upon standing and only tolerates standing ~15 seconds prior to needing seated break  Ambulation/Gait   General Gait Details: UTA, defer pt incontinent and very fatigued after single stand.          Balance Overall balance assessment: Needs assistance Sitting-balance support: No upper extremity supported;Feet supported Sitting balance-Leahy Scale: Fair Sitting balance - Comments: pt needing 1-2 UE support due to fatigue/deconditioning but no overt LOB with Supervision   Standing balance support: Bilateral upper extremity supported Standing balance-Leahy Scale: Poor Standing balance comment: walker and min assist for static standing          Cognition Arousal/Alertness: Awake/alert Behavior During Therapy: Flat affect Overall Cognitive Status: No family/caregiver present to determine baseline cognitive functioning Area of Impairment: Problem solving;Awareness;Orientation                 Orientation Level: Disoriented to;Time (pt asking "what day is it?" but able to state 2022 as year)     Following Commands: Follows  one step commands with increased time   Awareness: Emergent Problem Solving: Slow processing;Requires verbal cues;Decreased initiation General Comments: pt with slow  processing, max encouragement; poor awareness to having BM when standing and somewhat self limiting      Exercises      General Comments General comments (skin integrity, edema, etc.): BP 104/72 (83) supine; BP 116/79 (91) sitting EOB, RR 20-27 rpm with exertion and rest; initially 84% on RA as therapist entered room and with cues for pursed-lip breathing, improves only to 87% so pt placed on 1L HF [place] and SpO2 improved to 95% within 2 minutes and cues for pursed-lip breathing (RN notified), pt remains WFL on 1L [place] with exertion; HR 67 bpm resting to 90's bpm with transfer to EOB and 103 bpm with standing      Pertinent Vitals/Pain Pain Assessment: Faces Faces Pain Scale: Hurts even more Pain Location: generalized discomfort/grimacing especially with transfers Pain Descriptors / Indicators: Grimacing;Moaning Pain Intervention(s): Limited activity within patient's tolerance;Monitored during session;Repositioned     PT Goals (current goals can now be found in the care plan section) Acute Rehab PT Goals Patient Stated Goal: to rest and feel better PT Goal Formulation: With patient Time For Goal Achievement: 06/12/21 Progress towards PT goals: Progressing toward goals    Frequency    Min 3X/week      PT Plan Current plan remains appropriate       AM-PAC PT "6 Clicks" Mobility   Outcome Measure  Help needed turning from your back to your side while in a flat bed without using bedrails?: A Little Help needed moving from lying on your back to sitting on the side of a flat bed without using bedrails?: A Lot Help needed moving to and from a bed to a chair (including a wheelchair)?: A Lot Help needed standing up from a chair using your arms (e.g., wheelchair or bedside chair)?: A Lot Help needed to walk in hospital room?: Total Help needed climbing 3-5 steps with a railing? : Total 6 Click Score: 11    End of Session Equipment Utilized During Treatment: Oxygen Activity  Tolerance: Patient limited by fatigue;Other (comment) (pt c/o elevated RR feeling uncomfortable) Patient left: in bed;with nursing/sitter in room (NT present to assist with clean-up) Nurse Communication: Mobility status PT Visit Diagnosis: Other abnormalities of gait and mobility (R26.89);Muscle weakness (generalized) (M62.81) Pain - part of body:  (generalized)     Time: [id number] PT Time Calculation (min) (ACUTE ONLY): 25 min  Charges:  $Therapeutic Activity: 23-37 mins                     Domnique Vantine P., PTA Acute Rehabilitation Services Pager: [phone] Office: [place] 05/31/2021, 11:45 AM

## 2021-06-01 DIAGNOSIS — K223 Perforation of esophagus: Secondary | ICD-10-CM | POA: Diagnosis not present

## 2021-06-01 LAB — GLUCOSE, CAPILLARY
Glucose-Capillary: 111 mg/dL — ABNORMAL HIGH (ref 70–99)
Glucose-Capillary: 96 mg/dL (ref 70–99)
Glucose-Capillary: 92 mg/dL (ref 70–99)
Glucose-Capillary: 78 mg/dL (ref 70–99)
Glucose-Capillary: 86 mg/dL (ref 70–99)
Glucose-Capillary: 111 mg/dL — ABNORMAL HIGH (ref 70–99)
Glucose-Capillary: 122 mg/dL — ABNORMAL HIGH (ref 70–99)

## 2021-06-01 LAB — APTT
aPTT: 98 seconds — ABNORMAL HIGH (ref 24–36)
aPTT: 147 seconds — ABNORMAL HIGH (ref 24–36)

## 2021-06-01 LAB — HEPARIN LEVEL (UNFRACTIONATED): Heparin Unfractionated: 0.74 IU/mL — ABNORMAL HIGH (ref 0.30–0.70)

## 2021-06-01 MED ORDER — TRACE MINERALS CU-MN-SE-ZN 300-55-60-3000 MCG/ML IV SOLN
INTRAVENOUS | Status: AC
  Filled 2021-06-01: qty 745.53

## 2021-06-01 NOTE — Progress Notes (Addendum)
Family Medicine Teaching Service Daily Progress Note Intern Pager: [phone]  Patient name: Dana Coleman Medical record number: [id number] Date of birth: 09-May-1941 Age: 80 y.o. Gender: female  Primary Care Provider: Gifford Shave, MD Consultants: GI, CVTS, pulmonology, cardiology Code Status: Partial  Pt Overview and Major Events to Date:  7/23 admitted to ICU, intubated, right pigtail catheter placed 7/24- CRRT, emergent right thoracotomy, repair of esophageal perforation with intercostal pedicled muscle flap and drainage of empyema and decortication of right lung 7/25- NSTEMI, no intervention 7/26- extubated, TPN started 8/1- esophagram without leak, PO started, heparin gtt d/c because of increased bleeding from chest tubes. Transfused 1u pRBC 8/3: anterior chest tube removed 8/4: remaining chest tube removed 8/8: increased O2 requirement, Afib RVR -> PE with right heart strain 8/9: further increased O2 requirement, Afib 8/13: started Entresto, decreasing supplemental oxygen requirement    Assessment and Plan:  Dana Coleman is a 80 year old female admitted for empyema following esophageal perforation, s/p repair.  NSTEMI on 7/25, new onset A. fib and PE on 05/20/2021 PMH of breast cancer, CAD, hypertension, cholecystectomy and diabetes  Protein calorie malnutrition Patient is awaiting gastrostomy tube per IR. Pt has been consented for this procedure. No nausea or vomiting overnight. She was able to tolerate PO medications.  -IR consulted, appreciate recommendations -Continue ongoing PT -Assistance with meals -Monitor calorie count -Continue TPN per pharmacy  PE CTAB anteriorly. Pt is visibly more tachypneac than usual however does not feel more than her baseline. She was requiring 0.5L oxygen overnight, at bedside pt was de-satting to 88%, improving to 92% with good respiratory effort. Increased oxygen to 1L.  -Goal O2 sats 92-94% -Closely monitor respiratory  status -Wean oxygen as tolerated -Continue heparin gtt per pharmacy  Atrial fibrillation HR 99-100 -Continue metoprolol 25 mg twice daily  Type 2 diabetes CBGs 111-112 overnight  -Monitor CBGs Q4H -Very sensitive sliding scale  FEN/GI: Dysphagia 2 diet, Protonix PPx: Heparin Dispo:SNF pending clinical improvement . Barriers include nutritional status.   Subjective:  Pt feels tired and hot. She would like the temperature turned down in her room. She reports she is not more dyspneic than usual. Denies chest pain.   Objective: Temp:  [97.9 F (36.6 C)-99.1 F (37.3 C)] 99.1 F (37.3 C) (08/20 0045) Pulse Rate:  [99-100] 99 (08/19 1656) Resp:  [15-22] 22 (08/20 0045) BP: (113-127)/(73-86) 113/75 (08/20 0045) SpO2:  [91 %-95 %] 93 % (08/20 0045) Weight:  [71.2 kg] 71.2 kg (08/19 0328)  Physical Exam: General: Alert, no acute distress Cardio: Normal S1 and S2, RRR, no r/m/g Pulm: slightly increased WOB, no crackles  Abdomen: Bowel sounds normal. Abdomen soft and non-tender.  Extremities: No peripheral edema.  Neuro: Cranial nerves grossly intact   Laboratory: Recent Labs  Lab 05/27/21 0410 05/31/21 0603  WBC 9.3 10.8*  HGB 10.1* 10.1*  HCT 31.4* 31.0*  PLT 323 270   Recent Labs  Lab 05/25/21 0213 05/26/21 0055 05/29/21 0500 05/30/21 0426 05/31/21 0603  NA 135   < > 137 137 135  K 4.0   < > 4.3 4.5 4.2  CL 106   < > 103 103 102  CO2 20*   < > '29 27 26  '$ BUN 39*   < > 27* 28* 30*  CREATININE 0.78   < > 0.56 0.62 0.58  CALCIUM 8.5*   < > 8.7* 8.6* 8.5*  PROT 6.0*  --   --   --   --   BILITOT 0.4  --   --   --   --  ALKPHOS 59  --   --   --   --   ALT 26  --   --   --   --   AST 28  --   --   --   --   GLUCOSE 115*   < > 79 84 108*   < > = values in this interval not displayed.    Imaging/Diagnostic Tests: No results found.   Lattie Haw, MD 06/01/2021, 1:13 AM PGY-3, [place] Intern pager: [phone], text pages welcome

## 2021-06-01 NOTE — Progress Notes (Signed)
ANTICOAGULATION CONSULT NOTE - Follow-Up Consult  Pharmacy Consult for Heparin Indication: atrial fibrillation and pulmonary embolus  No Known Allergies  Patient Measurements: Height: '5\' 1"'$  (154.9 cm) Weight: 71.2 kg (157 lb) IBW/kg (Calculated) : 47.8 Heparin Dosing Weight: 62.6 kg  Vital Signs: Temp: 98.2 F (36.8 C) (08/20 1520) Temp Source: Oral (08/20 1520) BP: 131/76 (08/20 1520) Pulse Rate: 99 (08/20 1520)  Labs: Recent Labs    05/30/21 0426 05/30/21 2101 05/30/21 2101 05/31/21 0603 05/31/21 1004 05/31/21 2000 06/01/21 0500  HGB  --   --   --  10.1*  --   --   --   HCT  --   --   --  31.0*  --   --   --   PLT  --   --   --  270  --   --   --   APTT  --  142*   < >  --  130* 68* 98*  HEPARINUNFRC  --  >1.10*  --   --   --   --  0.74*  CREATININE 0.62  --   --  0.58  --   --   --    < > = values in this interval not displayed.     Estimated Creatinine Clearance: 51.5 mL/min (by C-G formula based on SCr of 0.58 mg/dL).  Assessment: 80yo female with moderate BL acute PE with RHS 8/8 and atrial fibrillation (no anticoagulation PTA).  Patient with complicated hospital course including empyema, acute renal failure, NSTEMI, and PE.  Currently on TPN with plans for g-tube.   Earlier this admission, patient was on heparin (8/8-8/12) and then transitioned to oral anticoagulation with apixaban '5mg'$  PO BID on 8/12.  Patient has not been able to consistently tolerate oral medications, last dose of apixaban '5mg'$  given on 8/18 @ 0906, and RN reported patient vomiting shortly after receiving dose.  Pharmacy has been consulted for heparin dosing.   aPTT up to 147 sec (supra-therapeutic) on gtt at 1500 units/hr.    Goal of Therapy:  Heparin level 0.3-0.7 units/ml aPTT 66-102 seconds.  Monitor platelets by anticoagulation protocol: Yes   Plan:  Decrease heparin drip to 1350  F/u 6 hr aPTT to confirm therapeutic  Albertina Parr, PharmD., BCPS, BCCCP Clinical  Pharmacist Please refer to [facility] for unit-specific pharmacist

## 2021-06-01 NOTE — Progress Notes (Signed)
Mobility Specialist: Progress Note   06/01/21 1717  Mobility  Activity Ambulated in room  Level of Assistance Minimal assist, patient does 75% or more  Assistive Device Front wheel walker  Distance Ambulated (ft) 10 ft  Mobility Ambulated with assistance in room  Mobility Response Tolerated well  Mobility performed by Mobility specialist  $Mobility charge 1 Mobility   Pre-Mobility on 1 L/min: 89 HR, 130/78 BP, 98% SpO2 Post-Mobility on RA: 94 HR, 128/84 BP, 92% SpO2  Pt agreeable to ambulation in the room. Pt bowel incontinent upon standing and transferred to [facility]. Pt assisted with pericare and was able to ambulate 72f to the sink and then back to the bed. Pt back in bed with call bell and phone at her side.   CFa[facility] ServicesDay Mobility Specialist Mobility Specialist Phone: [phone]

## 2021-06-01 NOTE — Progress Notes (Signed)
ANTICOAGULATION CONSULT NOTE - Follow-Up Consult  Pharmacy Consult for Heparin Indication: atrial fibrillation and pulmonary embolus  No Known Allergies  Patient Measurements: Height: '5\' 1"'$  (154.9 cm) Weight: 71.2 kg (157 lb) IBW/kg (Calculated) : 47.8 Heparin Dosing Weight: 62.6 kg  Vital Signs: Temp: 98.7 F (37.1 C) (08/20 0434) Temp Source: Oral (08/20 0434) BP: 124/72 (08/20 0434) Pulse Rate: 100 (08/20 0434)  Labs: Recent Labs    05/30/21 0426 05/30/21 2101 05/30/21 2101 05/31/21 0603 05/31/21 1004 05/31/21 2000 06/01/21 0500  HGB  --   --   --  10.1*  --   --   --   HCT  --   --   --  31.0*  --   --   --   PLT  --   --   --  270  --   --   --   APTT  --  142*   < >  --  130* 68* 98*  HEPARINUNFRC  --  >1.10*  --   --   --   --   --   CREATININE 0.62  --   --  0.58  --   --   --    < > = values in this interval not displayed.     Estimated Creatinine Clearance: 51.5 mL/min (by C-G formula based on SCr of 0.58 mg/dL).  Assessment: 80yo female with moderate BL acute PE with RHS 8/8 and atrial fibrillation (no anticoagulation PTA).  Patient with complicated hospital course including empyema, acute renal failure, NSTEMI, and PE.  Currently on TPN with plans for g-tube.   Earlier this admission, patient was on heparin (8/8-8/12) and then transitioned to oral anticoagulation with apixaban '5mg'$  PO BID on 8/12.  Patient has not been able to consistently tolerate oral medications, last dose of apixaban '5mg'$  given on 8/18 @ 0906, and RN reported patient vomiting shortly after receiving dose.  Pharmacy has been consulted for heparin dosing.   aPTT 98 sec (therapeutic) on gtt at 1500 units/hr. No bleeding noted.   Goal of Therapy:  Heparin level 0.3-0.7 units/ml aPTT 66-102 seconds.  Monitor platelets by anticoagulation protocol: Yes   Plan:  Continue heparin drip at 1500 units/hr F/u 6 hr aPTT to confirm therapeutic  Sherlon Handing, PharmD, BCPS Please see amion for  complete clinical pharmacist phone list 06/01/2021 6:35 AM

## 2021-06-01 NOTE — Progress Notes (Signed)
PHARMACY - TOTAL PARENTERAL NUTRITION CONSULT NOTE  Indication: s/p esophageal perforation and repair  Patient Measurements: Height: '5\' 1"'$  (154.9 cm) Weight: 71.2 kg (157 lb) IBW/kg (Calculated) : 47.8   Body mass index is 29.66 kg/m.  Assessment:  80 y/o female with recent lap chole for symptomatic cholelithiasis on 7/21. PMH including HTN, HLD, DM, R breast cancer, and CAD. Patient presented on 7/23 with AMS and unresponsiveness - was complaining of abdominal discomfort and then patient became unresponsive and slumped on table.  S/p gall bladder surgery and esophageal perforation-repaired 7/24.  Pharmacy consulted for TPN management.  Glucose / Insulin: DM with A1c 7.4% on Lantus 24/d PTA.  CBGs 110s on TPN, 90s off Utilized 0 units sens SSI + 70 units insulin in TPN - decreased 8/15 - decrease 8/18 to 70 units in TPN  Electrolytes: K 4.2, Na 135, CorCa wnl (8/19) Renal: septic ATN s/p CRRT (end 7/25) - SCr < 1 and stable, BUN at 30 Hepatic: LFTs significantly increased 8/4 (decreased with holding TPN for 6 hours and Lasix), tbili WNL, albumin down 1.9, prealbumin up 8.9, TG WNL Intake / Output; MIVF:  UOP not measured, LBM 8/19  GI Imaging: - 7/23 CT: no acute abnormalities  GI Surgeries / Procedures:  - 7/21: s/p laparoscopic cholecystectomy and LOA - 7/24: s/p R thoracotomy, repair of esophageal perf with muscle flap, drainage of empyema and decortication of R lung  Central access: PICC 7/26 (double lumen) TPN start date: 05/07/21  Nutritional Goals (per RD rec on 8/9): kCal: 1750-2030 , Protein: 90-120 , Fluid: >=1.8L  Current Nutrition:  TPN Ensure Enlive BID - refusing last 24h 8/12 Dysphagia 1 diet, little intake No oral feeding tube recommended due to esophageal injury F/u IR for possible gastrostomy tube  Plan:  Continue concentrated cyclic TPN over 16 hr at 1575 mL over 16 hrs: 53 mL/hr x 1 hr, then 105 mL/hr x 14 hrs, then 53 mL/hr x 1 hr. TPN provides 111g  AA, 230g CHO, and 58g ILE for total of 1803 kCal, meeting 100% of patient's needs. Electrolytes in TPN: Na 30 mEq/L (= 47 mEq), K 25 mEq/L (= 39 mEq), Ca 35mq/L since 7/30, Mg 11 mEq/L (= 17 mEq), Phos 30 mmol/L (= 47 mmol), maximum acetate Add standard MVI and trace elements to TPN Continue sens SSI Q4H and Decrease insulin in TPN to 65 units. Watch mid-day lower CBGs and encourage po intake TPN labs on Monday  JBertis Ruddy PharmD Clinical Pharmacist Please check AMION for all MGoodmannumbers 06/01/2021 7:07 AM

## 2021-06-02 ENCOUNTER — Inpatient Hospital Stay (HOSPITAL_COMMUNITY): Payer: Medicare Other

## 2021-06-02 DIAGNOSIS — K223 Perforation of esophagus: Secondary | ICD-10-CM | POA: Diagnosis not present

## 2021-06-02 LAB — BASIC METABOLIC PANEL
Anion gap: 6 (ref 5–15)
BUN: 30 mg/dL — ABNORMAL HIGH (ref 8–23)
CO2: 25 mmol/L (ref 22–32)
Calcium: 8.3 mg/dL — ABNORMAL LOW (ref 8.9–10.3)
Chloride: 105 mmol/L (ref 98–111)
Creatinine, Ser: 0.59 mg/dL (ref 0.44–1.00)
GFR, Estimated: >60 mL/min (ref >60)
Glucose, Bld: 113 mg/dL — ABNORMAL HIGH (ref 70–99)
Potassium: 4 mmol/L (ref 3.5–5.1)
Sodium: 136 mmol/L (ref 135–145)

## 2021-06-02 LAB — CBC
HCT: 30.5 % — ABNORMAL LOW (ref 36.0–46.0)
Hemoglobin: 10 g/dL — ABNORMAL LOW (ref 12.0–15.0)
MCH: 30.5 pg (ref 26.0–34.0)
MCHC: 32.8 g/dL (ref 30.0–36.0)
MCV: 93 fL (ref 80.0–100.0)
Platelets: 240 10*3/uL (ref 150–400)
RBC: 3.28 MIL/uL — ABNORMAL LOW (ref 3.87–5.11)
RDW: 18.7 % — ABNORMAL HIGH (ref 11.5–15.5)
WBC: 10 10*3/uL (ref 4.0–10.5)
nRBC: 0.2 % (ref 0.0–0.2)

## 2021-06-02 LAB — APTT
aPTT: 57 seconds — ABNORMAL HIGH (ref 24–36)
aPTT: 65 seconds — ABNORMAL HIGH (ref 24–36)
aPTT: 80 seconds — ABNORMAL HIGH (ref 24–36)

## 2021-06-02 LAB — GLUCOSE, CAPILLARY
Glucose-Capillary: 93 mg/dL (ref 70–99)
Glucose-Capillary: 103 mg/dL — ABNORMAL HIGH (ref 70–99)
Glucose-Capillary: 96 mg/dL (ref 70–99)
Glucose-Capillary: 89 mg/dL (ref 70–99)
Glucose-Capillary: 141 mg/dL — ABNORMAL HIGH (ref 70–99)

## 2021-06-02 LAB — HEPARIN LEVEL (UNFRACTIONATED): Heparin Unfractionated: 0.31 IU/mL (ref 0.30–0.70)

## 2021-06-02 MED ORDER — TRACE MINERALS CU-MN-SE-ZN 300-55-60-3000 MCG/ML IV SOLN
INTRAVENOUS | Status: AC
  Filled 2021-06-02: qty 745.53

## 2021-06-02 NOTE — Progress Notes (Signed)
   06/02/21 0551  Assess: MEWS Score  Temp 97.7 F (36.5 C)  BP 125/70  Pulse Rate 83  ECG Heart Rate 83  Resp (!) 39  SpO2 100 %  O2 Device Nasal Cannula  Patient Activity (if Appropriate) In bed  O2 Flow Rate (L/min) 2 L/min  Assess: MEWS Score  MEWS Temp 0  MEWS Systolic 0  MEWS Pulse 0  MEWS RR 3  MEWS LOC 0  MEWS Score 3  MEWS Score Color Yellow  Assess: if the MEWS score is Yellow or Red  Were vital signs taken at a resting state? Yes  Focused Assessment Change from prior assessment (see assessment flowsheet)  Early Detection of Sepsis Score *See Row Information* Low  MEWS guidelines implemented *See Row Information* No, previously yellow, continue vital signs every 4 hours  Treat  MEWS Interventions Escalated (See documentation below)  Notify: Charge Nurse/RN  Name of Charge Nurse/RN Notified Tim RN  Date Charge Nurse/RN Notified 06/02/21  Time Charge Nurse/RN Notified [id number]  Notify: Provider  Provider Name/Title Crezenso MD  Date Provider Notified 06/02/21  Time Provider Notified [phone]  Notification Type Page  Notification Reason Other (Comment) (Pt c/otrouble breathing)  Provider response En route  Date of Provider Response 06/02/21  Time of Provider Response [phone]

## 2021-06-02 NOTE — Progress Notes (Signed)
FPTS Brief Progress Note  S: Went and evaluated patient for nightly rounds.  Patient sleeping comfortably.  Discussed with nurse and she reports the patient is doing well and has no concerns at this time.   O: BP 109/74 (BP Location: Right Arm)   Pulse 100   Temp 98.6 F (37 C) (Oral)   Resp (!) 24   Ht '5\' 1"'$  (1.549 m)   Wt 71.2 kg   SpO2 98%   BMI 29.66 kg/m   General: Sleeping comfortably, no acute distress  A/P: Plan per day team - Orders reviewed.  Not collecting morning labs at this time.  Plan on collecting 8/22.  Gifford Shave, MD 06/02/2021, 12:23 AM PGY-3, [facility] Family [facility] Night Resident  Please page [phone] with questions.

## 2021-06-02 NOTE — Progress Notes (Signed)
PHARMACY - TOTAL PARENTERAL NUTRITION CONSULT NOTE  Indication: s/p esophageal perforation and repair  Patient Measurements: Height: '5\' 1"'$  (154.9 cm) Weight: 71.2 kg (157 lb) IBW/kg (Calculated) : 47.8   Body mass index is 29.66 kg/m.  Assessment:  80 y/o female with recent lap chole for symptomatic cholelithiasis on 7/21. PMH including HTN, HLD, DM, R breast cancer, and CAD. Patient presented on 7/23 with AMS and unresponsiveness - was complaining of abdominal discomfort and then patient became unresponsive and slumped on table.  S/p gall bladder surgery and esophageal perforation-repaired 7/24.  Pharmacy consulted for TPN management.  Glucose / Insulin: DM with A1c 7.4% on Lantus 24/d PTA.  CBGs 111-122 on TPN, 78-86s off Utilized 0 units sens SSI + 65 units insulin in TPN - decreased 8/20  Electrolytes: K 4.2, Na 135, CorCa wnl (8/19) Renal: septic ATN s/p CRRT (end 7/25) - SCr < 1 and stable, BUN at 30 Hepatic: LFTs significantly increased 8/4 (decreased with holding TPN for 6 hours and Lasix), tbili WNL, albumin down 1.9, prealbumin up 8.9, TG WNL Intake / Output; MIVF:  UOP not measured, LBM 8/19  GI Imaging: - 7/23 CT: no acute abnormalities  GI Surgeries / Procedures:  - 7/21: s/p laparoscopic cholecystectomy and LOA - 7/24: s/p R thoracotomy, repair of esophageal perf with muscle flap, drainage of empyema and decortication of R lung  Central access: PICC 7/26 (double lumen) TPN start date: 05/07/21  Nutritional Goals (per RD rec on 8/9): kCal: 1750-2030 , Protein: 90-120 , Fluid: >=1.8L  Current Nutrition:  TPN Ensure Enlive BID - refusing  8/12 Dysphagia 1 diet, little intake No oral feeding tube recommended due to esophageal injury F/u IR for possible gastrostomy tube  Plan:  Continue concentrated cyclic TPN over 16 hr at 1575 mL over 16 hrs: 53 mL/hr x 1 hr, then 105 mL/hr x 14 hrs, then 53 mL/hr x 1 hr. TPN provides 111g AA, 230g CHO, and 58g ILE for total of  1803 kCal, meeting 100% of patient's needs. Electrolytes in TPN: Na 30 mEq/L (= 47 mEq), K 25 mEq/L (= 39 mEq), Ca 14mq/L since 7/30, Mg 11 mEq/L (= 17 mEq), Phos 30 mmol/L (= 47 mmol), maximum acetate Add standard MVI and trace elements to TPN Continue sens SSI Q4H and insulin in TPN at 65 units. Watch mid-day lower CBGs and encourage po intake TPN labs in AM F/u G tube plans next week  JBertis Ruddy PharmD Clinical Pharmacist Please check AMION for all MSmolannumbers 06/02/2021 7:18 AM

## 2021-06-02 NOTE — Progress Notes (Signed)
Family Medicine Teaching Service Daily Progress Note Intern Pager: [phone]  Patient name: Dana Coleman Medical record number: [id number] Date of birth: 01-04-1941 Age: 80 y.o. Gender: female  Primary Care Provider: Gifford Shave, MD Consultants: GI, CVTS, pulmonology, cardiology Code Status: Partial code  Pt Overview and Major Events to Date:  7/23 admitted to ICU, intubated, right pigtail catheter placed 7/24- CRRT, emergent right thoracotomy, repair of esophageal perforation with intercostal pedicled muscle flap and drainage of empyema and decortication of right lung 7/25- NSTEMI, no intervention 7/26- extubated, TPN started 8/1- esophagram without leak, PO started, heparin gtt d/c because of increased bleeding from chest tubes. Transfused 1u pRBC 8/3: anterior chest tube removed 8/4: remaining chest tube removed 8/8: increased O2 requirement, Afib RVR -> PE with right heart strain 8/9: further increased O2 requirement, Afib 8/13: started Entresto, decreasing supplemental oxygen requirement  Assessment and Plan: Dana Coleman is a [age]YO female admitted for empyema following esophageal perforation, s/p repair.  She then had an NSTEMI on 7/25, new onset A. fib and PE on 05/20/2021.  PMH significant for breast cancer, CAD, HTN, cholecystectomy, and diabetes.  Protein calorie malnutrition Patient scheduled for gastrostomy tube per IR on 8/22.  Patient has been consented for the procedure.  No nausea or vomiting overnight and is doing well otherwise.  She is able to tolerate p.o. medications. - IR consulted, appreciate recommendations - N.p.o. at midnight - Continue PT - Assistance with meals - Monitor calorie count - Continue TPN per pharmacy  PE Patient with increased work of breathing this morning.  She called out when she woke up that she was having shortness of breath.  O2 saturation did decrease so nurse increased O2 to 4 L.  O2 saturation 94-98% on evaluation.   Patient is tachypneic between 28 and 32. - EKG and chest x-ray have been ordered -CBC, BMP, VBG ordered - Patient may need repeat chest CT but will wait on results from chest x-ray, VBG, EKG - O2 goal of 92-94% - Continue to monitor respiratory status - Wean O2 as needed - Continue heparin GTT per pharmacy   A. fib Pulse ranging from 95-99..  On evaluation heart rate in the 80s.  Continue to monitor heart rate - Continue metoprolol 25 mg twice daily  T2DM CBGs ranging from 78-1 22 - Continue to monitor CBGs every 4 hours - Very sensitive sliding scale insuli  FEN/GI: Dysphagia to, Protonix, plan for n.p.o. at midnight for G-tube placement procedure tomorrow PPx: Heparin Dispo: SNF pending clinical improvement.  Barriers to discharge include nutritional status concerns  Subjective:  Patient is complaining that she is having worsening shortness of breath this morning. 80  She does feel that nerves are playing a role in that.  She reports she woke up and felt short of breath.  Objective: Temp:  [97.7 F (36.5 C)-99 F (37.2 C)] 97.7 F (36.5 C) (08/21 0551) Pulse Rate:  [83-100] 83 (08/21 0551) Resp:  [16-39] 39 (08/21 0551) BP: (109-134)/(70-81) 125/70 (08/21 0551) SpO2:  [94 %-100 %] 100 % (08/21 0551) Physical Exam: General: Uncomfortable appearing/anxious appearing Cardiovascular: Regular rate, 80s Respiratory: Increased work of breathing, 4 L nasal cannula with O2 saturation ranging from 94-98%.  She is tachypneic 28-32.  Equal breath sounds in all lung fields with no crackles heard at lung bases. Abdomen: Soft, nontender Extremities: No lower extremity edema, swelling, pain  Laboratory: Recent Labs  Lab 05/27/21 0410 05/31/21 0603  WBC 9.3 10.8*  HGB 10.1* 10.1*  HCT 31.4* 31.0*  PLT 323 270   Recent Labs  Lab 05/29/21 0500 05/30/21 0426 05/31/21 0603  NA 137 137 135  K 4.3 4.5 4.2  CL 103 103 102  CO2 '29 27 26  '$ BUN 27* 28* 30*  CREATININE 0.56 0.62 0.58   CALCIUM 8.7* 8.6* 8.5*  GLUCOSE 79 84 108*    Imaging/Diagnostic Tests: No results found.   Gifford Shave, MD 06/02/2021, 6:18 AM PGY-3, [place] Intern pager: [phone], text pages welcome

## 2021-06-02 NOTE — Progress Notes (Signed)
ANTICOAGULATION CONSULT NOTE - Follow Up Consult  Pharmacy Consult for Heparin Indication: atrial fibrillation and pulmonary embolus  No Known Allergies  Patient Measurements: Height: '5\' 1"'$  (154.9 cm) Weight: 71.2 kg (157 lb) IBW/kg (Calculated) : 47.8 Heparin Dosing Weight: 62.6 kg  Vital Signs: Temp: 98.2 F (36.8 C) (08/21 1200) Temp Source: Oral (08/21 1200) BP: 125/77 (08/21 1200) Pulse Rate: 89 (08/21 1200)  Labs: Recent Labs    05/30/21 2101 05/31/21 0603 05/31/21 1004 06/01/21 0500 06/01/21 1200 06/02/21 0009 06/02/21 0631 06/02/21 0900  HGB  --  10.1*  --   --   --   --  10.0*  --   HCT  --  31.0*  --   --   --   --  30.5*  --   PLT  --  270  --   --   --   --  240  --   APTT 142*  --    < > 98* 147* 65*  --  57*  HEPARINUNFRC >1.10*  --   --  0.74*  --   --   --  0.31  CREATININE  --  0.58  --   --   --   --  0.59  --    < > = values in this interval not displayed.    Estimated Creatinine Clearance: 51.5 mL/min (by C-G formula based on SCr of 0.59 mg/dL).  Assessment: 80yo female with moderate BL acute PE with RHS 8/8 and atrial fibrillation (no anticoagulation PTA).  Patient with complicated hospital course including empyema, acute renal failure, NSTEMI, and PE.  Currently on TPN with plans for g-tube.    Earlier this admission, patient was on heparin (8/8-8/12) and then transitioned to oral anticoagulation with apixaban '5mg'$  PO BID on 8/12.  Patient has not been able to consistently tolerate oral medications, last dose of apixaban '5mg'$  given on 8/18 @ 0906, and RN reported patient vomiting shortly after receiving dose.  Pharmacy has been consulted for heparin dosing.    aPTT 57 (subtherapeutic) and heparin level 0.31 (likely falsely elevated due to recent apixaban) on gtt at 1400 units/hr. No bleeding noted per chart review. Due to large fluctuations in aPTT, will increase heparin infusion slightly.  Goal of Therapy:  Heparin level 0.3-0.7 units/ml aPTT  66-102 seconds Monitor platelets by anticoagulation protocol: Yes   Plan:  Increase heparin to 1450 units/hr  Check ~6h aPTT Check daily  Monitor daily aPTT and heparin level, CBC, s/sx bleeding   Thank you for including pharmacy in the care of this patient.  Zenaida Deed, PharmD PGY1 Acute Care Pharmacy Resident  Phone: [phone] 06/02/2021  3:09 PM  Please check [email or web address] for unit-specific pharmacy phone numbers.

## 2021-06-02 NOTE — Progress Notes (Signed)
ANTICOAGULATION CONSULT NOTE - Follow Up Consult  Pharmacy Consult for Heparin Indication: atrial fibrillation and pulmonary embolus  No Known Allergies  Patient Measurements: Height: '5\' 1"'$  (154.9 cm) Weight: 71.2 kg (157 lb) IBW/kg (Calculated) : 47.8 Heparin Dosing Weight: 62.6 kg  Vital Signs: Temp: 98.2 F (36.8 C) (08/21 2000) Temp Source: Oral (08/21 2000) BP: 124/73 (08/21 2000) Pulse Rate: 99 (08/21 2000)  Labs: Recent Labs    05/31/21 0603 05/31/21 1004 06/01/21 0500 06/01/21 1200 06/02/21 0009 06/02/21 0631 06/02/21 0900 06/02/21 2128  HGB 10.1*  --   --   --   --  10.0*  --   --   HCT 31.0*  --   --   --   --  30.5*  --   --   PLT 270  --   --   --   --  240  --   --   APTT  --    < > 98*   < > 65*  --  57* 80*  HEPARINUNFRC  --   --  0.74*  --   --   --  0.31  --   CREATININE 0.58  --   --   --   --  0.59  --   --    < > = values in this interval not displayed.    Estimated Creatinine Clearance: 51.5 mL/min (by C-G formula based on SCr of 0.59 mg/dL).  Assessment: 80yo female with moderate BL acute PE with RHS 8/8 and atrial fibrillation (no anticoagulation PTA).  Patient with complicated hospital course including empyema, acute renal failure, NSTEMI, and PE.  Patient was on Apixaban from 8/12 to 8/18 AM.  Pharmacy has been consulted for heparin dosing.    aPTT 80 (therapeutic) on current Heparin rate of 1450 units/hr. No bleeding noted per chart review.   Goal of Therapy:  Heparin level 0.3-0.7 units/ml aPTT 66-102 seconds Monitor platelets by anticoagulation protocol: Yes   Plan:  Continue heparin to 1450 units/hr  Monitor daily aPTT and heparin level, CBC, s/sx bleeding   Thank you for including pharmacy in the care of this patient.  Sloan Leiter, PharmD, BCPS, BCCCP Clinical Pharmacist Please refer to [facility] for [place] numbers 06/02/2021  10:23 PM  Please check [email or web address] for unit-specific pharmacy phone numbers.

## 2021-06-02 NOTE — Progress Notes (Signed)
Arrived to bedside. Explained the procedure to patient. HOB elevated; red port of PICC line power flushed with 20 mL NS per PICC nurse instruction with Tripp noted. Heparin drip infusing to purple port. Stat chest film ordered to verify proper placement of PICC tip after power flush per Dr. Nita Sells.

## 2021-06-02 NOTE — Progress Notes (Signed)
FPTS Brief Progress Note  S Saw patient at bedside this evening. Patient was sleeping comfortably.   O: BP 124/73 (BP Location: Right Arm)   Pulse 99   Temp 98.2 F (36.8 C) (Oral)   Resp 17   Ht '5\' 1"'$  (1.549 m)   Wt 71.2 kg   SpO2 97%   BMI 29.66 kg/m    General: sleeping comfortably  A/P: Plan per day team  -NPO MN for g-tube placement  -Monitor respiratory status  -Orders reviewed. Labs for AM ordered, which was adjusted as needed.   Lattie Haw, MD 06/02/2021, 10:11 PM PGY-3, [place] [facility] Night Resident  Please page [phone] with questions.

## 2021-06-02 NOTE — Progress Notes (Signed)
Received a call from Dr. Nita Sells per this pt's chest xray of the PICC line. Imaging evidenced the PICC was malpositioned in the brachiocephalic vein. Relayed this information to the PICC nurses Levada Dy, RN and Clarene Critchley, Therapist, sports. Was instructed to power flush the PICC line, check for blood return, then obtain a new chest film. IF needed, a PICC line exchange order can be placed per MD orders.

## 2021-06-02 NOTE — Progress Notes (Signed)
ANTICOAGULATION CONSULT NOTE - Follow-Up Consult  Pharmacy Consult for Heparin Indication: atrial fibrillation and pulmonary embolus  No Known Allergies  Patient Measurements: Height: '5\' 1"'$  (154.9 cm) Weight: 71.2 kg (157 lb) IBW/kg (Calculated) : 47.8 Heparin Dosing Weight: 62.6 kg  Vital Signs: Temp: 98.6 F (37 C) (08/20 2326) Temp Source: Oral (08/20 2326) BP: 109/74 (08/20 2326) Pulse Rate: 100 (08/20 1855)  Labs: Recent Labs    05/30/21 0426 05/30/21 2101 05/31/21 0603 05/31/21 1004 06/01/21 0500 06/01/21 1200 06/02/21 0009  HGB  --   --  10.1*  --   --   --   --   HCT  --   --  31.0*  --   --   --   --   PLT  --   --  270  --   --   --   --   APTT  --  142*  --    < > 98* 147* 65*  HEPARINUNFRC  --  >1.10*  --   --  0.74*  --   --   CREATININE 0.62  --  0.58  --   --   --   --    < > = values in this interval not displayed.     Estimated Creatinine Clearance: 51.5 mL/min (by C-G formula based on SCr of 0.58 mg/dL).  Assessment: 80yo female with moderate BL acute PE with RHS 8/8 and atrial fibrillation (no anticoagulation PTA).  Patient with complicated hospital course including empyema, acute renal failure, NSTEMI, and PE.  Currently on TPN with plans for g-tube.   Earlier this admission, patient was on heparin (8/8-8/12) and then transitioned to oral anticoagulation with apixaban '5mg'$  PO BID on 8/12.  Patient has not been able to consistently tolerate oral medications, last dose of apixaban '5mg'$  given on 8/18 @ 0906, and RN reported patient vomiting shortly after receiving dose.  Pharmacy has been consulted for heparin dosing.   aPTT now down to subtherapeutic (65 sec) on gtt at 1350 units/hr. aPTT are quite variable - hopefully heparin level correlating in the morning. No issues with line or bleeding reported per RN.   Goal of Therapy:  Heparin level 0.3-0.7 units/ml aPTT 66-102 seconds Monitor platelets by anticoagulation protocol: Yes   Plan:  Increase  heparin to 1400 units/hr F/u 8 hr aPTT and heparin level  Sherlon Handing, PharmD, BCPS Please see amion for complete clinical pharmacist phone list 06/02/2021 12:44 AM

## 2021-06-03 ENCOUNTER — Inpatient Hospital Stay (HOSPITAL_COMMUNITY): Payer: Medicare Other

## 2021-06-03 DIAGNOSIS — K223 Perforation of esophagus: Secondary | ICD-10-CM | POA: Diagnosis not present

## 2021-06-03 HISTORY — PX: IR GASTROSTOMY TUBE MOD SED: IMG625

## 2021-06-03 LAB — BASIC METABOLIC PANEL
Anion gap: 6 (ref 5–15)
BUN: 27 mg/dL — ABNORMAL HIGH (ref 8–23)
CO2: 25 mmol/L (ref 22–32)
Calcium: 8.4 mg/dL — ABNORMAL LOW (ref 8.9–10.3)
Chloride: 106 mmol/L (ref 98–111)
Creatinine, Ser: 0.59 mg/dL (ref 0.44–1.00)
GFR, Estimated: >60 mL/min (ref >60)
Glucose, Bld: 68 mg/dL — ABNORMAL LOW (ref 70–99)
Potassium: 4.1 mmol/L (ref 3.5–5.1)
Sodium: 137 mmol/L (ref 135–145)

## 2021-06-03 LAB — MAGNESIUM: Magnesium: 2.1 mg/dL (ref 1.7–2.4)

## 2021-06-03 LAB — GLUCOSE, CAPILLARY
Glucose-Capillary: 103 mg/dL — ABNORMAL HIGH (ref 70–99)
Glucose-Capillary: 106 mg/dL — ABNORMAL HIGH (ref 70–99)
Glucose-Capillary: 127 mg/dL — ABNORMAL HIGH (ref 70–99)
Glucose-Capillary: 150 mg/dL — ABNORMAL HIGH (ref 70–99)
Glucose-Capillary: 77 mg/dL (ref 70–99)
Glucose-Capillary: 80 mg/dL (ref 70–99)

## 2021-06-03 LAB — CBC
HCT: 29.9 % — ABNORMAL LOW (ref 36.0–46.0)
Hemoglobin: 9.7 g/dL — ABNORMAL LOW (ref 12.0–15.0)
MCH: 30.3 pg (ref 26.0–34.0)
MCHC: 32.4 g/dL (ref 30.0–36.0)
MCV: 93.4 fL (ref 80.0–100.0)
Platelets: 241 10*3/uL (ref 150–400)
RBC: 3.2 MIL/uL — ABNORMAL LOW (ref 3.87–5.11)
RDW: 19 % — ABNORMAL HIGH (ref 11.5–15.5)
WBC: 9.3 10*3/uL (ref 4.0–10.5)
nRBC: 0 % (ref 0.0–0.2)

## 2021-06-03 LAB — PREALBUMIN: Prealbumin: 12.9 mg/dL — ABNORMAL LOW (ref 18–38)

## 2021-06-03 LAB — PROTIME-INR
INR: 1.3 — ABNORMAL HIGH (ref 0.8–1.2)
Prothrombin Time: 15.7 seconds — ABNORMAL HIGH (ref 11.4–15.2)

## 2021-06-03 LAB — TRIGLYCERIDES: Triglycerides: 65 mg/dL (ref <150)

## 2021-06-03 LAB — HEPARIN LEVEL (UNFRACTIONATED): Heparin Unfractionated: 0.57 IU/mL (ref 0.30–0.70)

## 2021-06-03 LAB — APTT: aPTT: 114 seconds — ABNORMAL HIGH (ref 24–36)

## 2021-06-03 LAB — PHOSPHORUS: Phosphorus: 4 mg/dL (ref 2.5–4.6)

## 2021-06-03 MED ORDER — LIDOCAINE-EPINEPHRINE (PF) 1 %-1:200000 IJ SOLN
INTRAMUSCULAR | Status: AC | PRN
  Administered 2021-06-03: 20 mL

## 2021-06-03 MED ORDER — HEPARIN (PORCINE) 25000 UT/250ML-% IV SOLN
1400.0000 [IU]/h | INTRAVENOUS | Status: DC
  Administered 2021-06-03: 1400 [IU]/h via INTRAVENOUS

## 2021-06-03 MED ORDER — IOHEXOL 240 MG/ML SOLN
50.0000 mL | Freq: Once | INTRAMUSCULAR | Status: AC | PRN
  Administered 2021-06-03: 20 mL via INTRAVENOUS

## 2021-06-03 MED ORDER — MIDAZOLAM HCL 2 MG/2ML IJ SOLN
INTRAMUSCULAR | Status: AC
  Filled 2021-06-03: qty 2

## 2021-06-03 MED ORDER — TRACE MINERALS CU-MN-SE-ZN 300-55-60-3000 MCG/ML IV SOLN
INTRAVENOUS | Status: AC
  Filled 2021-06-03: qty 745.53

## 2021-06-03 MED ORDER — LIDOCAINE-EPINEPHRINE 1 %-1:100000 IJ SOLN
INTRAMUSCULAR | Status: AC
  Filled 2021-06-03: qty 1

## 2021-06-03 MED ORDER — HEPARIN (PORCINE) 25000 UT/250ML-% IV SOLN
1400.0000 [IU]/h | INTRAVENOUS | Status: DC
  Filled 2021-06-03: qty 250

## 2021-06-03 MED ORDER — FENTANYL CITRATE (PF) 100 MCG/2ML IJ SOLN
INTRAMUSCULAR | Status: AC | PRN
  Administered 2021-06-03 (×2): 25 ug via INTRAVENOUS

## 2021-06-03 MED ORDER — STERILE WATER FOR INJECTION IJ SOLN
INTRAMUSCULAR | Status: AC
  Filled 2021-06-03: qty 10

## 2021-06-03 MED ORDER — ALTEPLASE 2 MG IJ SOLR
2.0000 mg | Freq: Once | INTRAMUSCULAR | Status: AC
  Administered 2021-06-03: 2 mg

## 2021-06-03 MED ORDER — FENTANYL CITRATE (PF) 100 MCG/2ML IJ SOLN
INTRAMUSCULAR | Status: AC
  Filled 2021-06-03: qty 2

## 2021-06-03 MED ORDER — MIDAZOLAM HCL 2 MG/2ML IJ SOLN
INTRAMUSCULAR | Status: AC | PRN
  Administered 2021-06-03 (×2): 0.5 mg via INTRAVENOUS

## 2021-06-03 MED ORDER — HEPARIN (PORCINE) 25000 UT/250ML-% IV SOLN: 1400.0000 [IU]/h | INTRAVENOUS | Status: DC

## 2021-06-03 MED ORDER — CEFAZOLIN SODIUM-DEXTROSE 2-4 GM/100ML-% IV SOLN
INTRAVENOUS | Status: AC
  Administered 2021-06-03: 2000 mg
  Filled 2021-06-03: qty 100

## 2021-06-03 NOTE — Progress Notes (Signed)
FPTS Brief Progress Note  S Saw patient at bedside this evening. Patient was sleeping comfortably. I did not wake the patient. No concerns from night RN however he asked whether pt can have PO meds.   O: BP 125/75 (BP Location: Right Arm)   Pulse 90   Temp 99.1 F (37.3 C) (Oral)   Resp 20   Ht '5\' 1"'$  (1.549 m)   Wt 71.2 kg   SpO2 97%   BMI 29.66 kg/m    General: sleeping comfortably  A/P: Plan per day team  G tube placed today per IR -NPO except sips and chips remainder of today and overnight -ADAT and begin using G- tube tomorrow  -Orders reviewed. Labs for AM ordered, which was adjusted as needed.    Lattie Haw, MD 06/03/2021, 9:55 PM PGY-3, [place] [facility] Night Resident  Please page [phone] with questions.

## 2021-06-03 NOTE — Progress Notes (Addendum)
Family Medicine Teaching Service Daily Progress Note Intern Pager: [phone]  Patient name: Dana Coleman Medical record number: [id number] Date of birth: 05-16-1941 Age: 80 y.o. Gender: female  Primary Care Provider: Gifford Shave, MD Consultants: GI, CVTS, Pulmonology, Cardiology Code Status: Partial Code  Pt Overview and Major Events to Date:  7/23 admitted to ICU, intubated, right pigtail catheter placed 7/24- CRRT, emergent right thoracotomy, repair of esophageal perforation with intercostal pedicled muscle flap and drainage of empyema and decortication of right lung 7/25- NSTEMI, no intervention 7/26- extubated, TPN started 8/1- esophagram without leak, PO started, heparin gtt d/c because of increased bleeding from chest tubes. Transfused 1u pRBC 8/3: anterior chest tube removed 8/4: remaining chest tube removed 8/8: increased O2 requirement, Afib RVR -> PE with right heart strain 8/9: further increased O2 requirement, Afib 8/13: started Entresto, decreasing supplemental oxygen requirement 8/22: G-tube placed    Assessment and Plan: Dana Coleman is a 80 year old female admitted for empyema following esophageal perforation, s/p repair, NSTEMI on 7/25, new onset a.fib. and PE on 05/20/2021. PMH signiciant for HTN, cholecystectomy, type 2 DM, breast cancer.  Protein calorie malnutrition/ Deconditioning Successful placement of gastrostomy tube today by IR, no complications. No nausea or vomiting.Will contact CIR about possible placement, as she was too weak last time we attempted placement, but she is improving with PT and is less weak. - NPO except for sips and ice chips remainder of today and overnight - May advance diet as tolerated/begin using tube tomorrow morning - PT/OT  PE No increased work of breathing or shortness of breath. Patient with normal respiratory rate and maintaining oxygen saturations in upper 90s on 0.5L [place] while I examined her. - Continue to monitor  respiratory status - O2 goal > 92% - Wean O2 as tolerated - Will transition from IV Heparin to Elliquis tomorrow  A. Fib. Pulse 80s-90s. Continue to monitor heart rate - Continue metoprolol '25mg'$  twice daily  T2DM CBG ranges: 80-120s - Monitor CBG q4 hours - Very sensitive sliding scale insulin- HOLD while NPO   FEN/GI: NPO until tomorrow morning PPx: Holding Heparin in setting of surgery Dispo:SNF pending clinical improvement . Barriers include nutritional status concerns/placement.   Subjective:  Dana Coleman has no complaints this morning. No chest pain, SOB, nausea, vomiting.  Objective: Temp:  [97.4 F (36.3 C)-98.5 F (36.9 C)] 97.4 F (36.3 C) (08/22 0814) Pulse Rate:  [89-99] 94 (08/22 0814) Resp:  [14-27] 20 (08/22 0814) BP: (118-133)/(71-83) 128/73 (08/22 0814) SpO2:  [96 %-99 %] 97 % (08/22 0814) Physical Exam: General: Weak, no acute distress, pleasant, conversational Cardiovascular: RRR, no murmur appreciated Respiratory: Diminished breath sounds on right side, no wheezing or crackles Abdomen: Soft, non-tender,non-distended Extremities: Warm, dry. No edema.  Laboratory: Recent Labs  Lab 05/31/21 0603 06/02/21 0631 06/03/21 0500  WBC 10.8* 10.0 9.3  HGB 10.1* 10.0* 9.7*  HCT 31.0* 30.5* 29.9*  PLT 270 240 241   Recent Labs  Lab 05/31/21 0603 06/02/21 0631 06/03/21 0500  NA 135 136 137  K 4.2 4.0 4.1  CL 102 105 106  CO2 '26 25 25  '$ BUN 30* 30* 27*  CREATININE 0.58 0.59 0.59  CALCIUM 8.5* 8.3* 8.4*  GLUCOSE 108* 113* 68*    Imaging/Diagnostic Tests: DG Chest 1 View  Result Date: 06/02/2021 CLINICAL DATA:  New left picc EXAM: CHEST  1 VIEW COMPARISON:  the previous day's study FINDINGS: Relatively low lung volumes. Stable left retrocardiac consolidation. Patchy interstitial and airspace opacities in the  right lung involving the base more than the upper lobe as before. Left arm PICC line now extends to mid SVC. Heart size upper limits normal.  Aortic Atherosclerosis (ICD10-170.0). Blunting of lateral costophrenic angles suggesting small effusions. No pneumothorax. Visualized bones unremarkable. IMPRESSION: 1. PICC line to the mid SVC. 2. Stable asymmetric airspace infiltrates with small effusions. Electronically Signed   By: Lucrezia Europe M.D.   On: 06/02/2021 16:30   IR GASTROSTOMY TUBE MOD SED  Result Date: 06/03/2021 INDICATION: 80 year old female with history of recent esophageal perforation status post muscle flap repair with residual dysphagia and associated malnutrition. Presents for enteric access. EXAM: PERC PLACEMENT GASTROSTOMY MEDICATIONS: Ancef 2 gm IV; Antibiotics were administered within 1 hour of the procedure. ANESTHESIA/SEDATION: Versed 1 mg IV; Fentanyl 50 mcg IV Moderate Sedation Time:  12 The patient was continuously monitored during the procedure by the interventional radiology nurse under my direct supervision. CONTRAST:  20 mL Omnipaque 350-administered into the gastric lumen. FLUOROSCOPY TIME:  Fluoroscopy Time: 1 minutes 12 seconds (17 mGy). COMPLICATIONS: None immediate. PROCEDURE: Informed written consent was obtained from the patient after a thorough discussion of the procedural risks, benefits and alternatives. All questions were addressed. Maximal Sterile Barrier Technique was utilized including caps, mask, sterile gowns, sterile gloves, sterile drape, hand hygiene and skin antiseptic. A timeout was performed prior to the initiation of the procedure. The patient was placed on the procedure table in the supine position. Pre-procedure abdominal film confirmed visualization of the transverse colon. An angled 5-French catheter was passed through the nares into the stomach. The patient was prepped and draped in usual sterile fashion. The stomach was insufflated with air via the indwelling nasogastric tube. Under fluoroscopy, a puncture site was selected and local analgesia achieved with 1% lidocaine infiltrated subcutaneously.  Under fluoroscopic guidance, a gastropexy needle was passed into the stomach and the T-bar suture was released. Entry into the stomach was confirmed with fluoroscopy, aspiration of air, and injection of contrast material. This was repeated with an additional gastropexy suture (for a total of 2 fasteners). At the center of these gastropexy sutures, a dermatotomy was performed. An 18 gauge needle was passed into the stomach atthe site of this dermatotomy, and position within the gastric lumen again confirmed under fluoroscopy using aspiration of air and contrast injection. An Amplatz guidewire was passed through this needle and intraluminal placement within the stomach was onfirmed by fluoroscopy. The needle was removed. Over the guidewire, the percutaneous tract was dilated using a 10 mm non-compliant balloon. The balloon was deflated, then pushed into the gastric lumen followed in concert by the 22 [place] gastrostomy tube. The retention balloon of the percutaneous gastrostomy tube as inflated with 10 mL of sterile water. The tube was withdrawn until the retention balloon was at the edge of the gastric lumen. The external bumper was brought to the abdominal wall. Contrast was injected through the gastrostomy tube, confirming intraluminal positioning. The patient tolerated the procedure well without any immediate post-procedural complications. IMPRESSION: Technically successful placement of 22 Fr balloon retention gastrostomy tube with absorbable gastropexy sutures. Ruthann Cancer, MD [facility] Electronically Signed   By: Ruthann Cancer M.D.   On: 06/03/2021 14:26     Orvis Brill, DO 06/03/2021, 9:47 AM PGY-1, [place] Intern pager: [phone], text pages welcome

## 2021-06-03 NOTE — Progress Notes (Addendum)
ANTICOAGULATION CONSULT NOTE - Follow Up Consult  Pharmacy Consult for Heparin Indication: atrial fibrillation and pulmonary embolus  No Known Allergies  Patient Measurements: Height: '5\' 1"'$  (154.9 cm) Weight: 71.2 kg (157 lb) IBW/kg (Calculated) : 47.8 Heparin Dosing Weight: 62.6 kg  Vital Signs: Temp: 97.4 F (36.3 C) (08/22 0814) Temp Source: Oral (08/22 0814) BP: 128/73 (08/22 0814) Pulse Rate: 94 (08/22 0814)  Labs: Recent Labs    06/01/21 0500 06/01/21 1200 06/02/21 0631 06/02/21 0900 06/02/21 2128 06/03/21 0500  HGB  --   --  10.0*  --   --  9.7*  HCT  --   --  30.5*  --   --  29.9*  PLT  --   --  240  --   --  241  APTT 98*   < >  --  57* 80* 114*  HEPARINUNFRC 0.74*  --   --  0.31  --  0.57  CREATININE  --   --  0.59  --   --  0.59   < > = values in this interval not displayed.     Estimated Creatinine Clearance: 51.5 mL/min (by C-G formula based on SCr of 0.59 mg/dL).  Assessment: 80yo female with moderate BL acute PE with RHS 8/8 and atrial fibrillation (no anticoagulation PTA).  Patient with complicated hospital course including empyema, acute renal failure, NSTEMI, and PE.  Patient was on Apixaban from 8/12 to 8/18 AM.  Pharmacy has been consulted for heparin dosing.   Heparin level therapeutic at 0.57 on 1450 units/hr.  Heparin level and aPTT now correlating, will continue to monitor heparin levels moving forward.  No lines issues or signs/symptoms of bleeding noted per RN.   Goal of Therapy:  Heparin level 0.3-0.7 units/ml aPTT 66-102 seconds Monitor platelets by anticoagulation protocol: Yes   Plan:  Decrease heparin to 1400 units/hr.  Check daily HL, CBC. Monitor for signs/symptoms of bleeding.  Per IR, will hold heparin d/t gastrotomy tube placement.  Follow-up restarting anticoagulation post-procedure.  Thank you for including pharmacy in the care of this patient.  Vance Peper, PharmD PGY1 Pharmacy Resident 06/03/2021 8:50 AM   Please  check AMION for all [place] phone numbers After 10:00 PM, call [place] [phone]

## 2021-06-03 NOTE — Progress Notes (Signed)
SLP Cancellation Note  Patient Details Name: Dana Coleman MRN: [id number] DOB: [birth year]   Cancelled treatment:       Reason Eval/Treat Not Completed: Medical issues which prohibited therapy - pt is to be NPO today pending PEG placement. Will f/u as able.     Osie Bond., M.A. [place] [facility] Pager [phone] Office [phone]  06/03/2021, 7:19 AM

## 2021-06-03 NOTE — Progress Notes (Signed)
Nutrition Follow-up  DOCUMENTATION CODES:   Severe malnutrition in context of acute illness/injury  INTERVENTION:   -Continue TPN to meet 100% estimated nutritional needs until patient demonstrates tolerance of tube feeding at goal rate.   -Obtain daily weights    Once PEG placed: -Osmolite 1.5 @ 20 ml/hr -Increase by 10 ml Q4 hours to goal rate of 50 ml/hr (1200 ml) -ProSource TF 45 ml BID  Provides: 1880 kcals, 97 grams protein, 914 ml free water.   NUTRITION DIAGNOSIS:   Severe Malnutrition related to acute illness as evidenced by moderate fat depletion, mild fat depletion, moderate muscle depletion.  Ongoing  GOAL:   Patient will meet greater than or equal to 90% of their needs  Addressed via TPN  MONITOR:   Diet advancement, I & O's, Labs, Weight trends, Skin (TPN)  REASON FOR ASSESSMENT:   Consult Calorie Count  ASSESSMENT:   80 yo female admitted post recent lap cholecystectomy with acute respiratory failure and renal failure with severe lactic acidosis from sepsis and found to have esophageal perforation, empyema. PMH includes DM, HTN, HLD, CAD  7/21 Lap chole 7/23 Admitted, Intubated, Pigtail by CCS in ED with bilious output 7/24 CRRT initiated, R Thoracotomy, repair of esophageal perforation, drainage of empyema and decortication of right lung 7/26 Extubated, TPN initiated 7/31 Stop CRRT 8/01 Barium swallow, no leak 8/02 CL diet started 8/12 s/p MBSS- advanced to dysphagia 1 diet with thin liquids  Intake remains inadequate. Last eight meal completions charted as 0-25%. Taking supplements intermittently. TPN continues at 1575 ml over 16 hours to provide 111 g protein and 1765 kcal. Plan PEG today. Continue TPN to meet 100% of needs until patient demonstrates tolerance of tube feeding at goal rate.   Admission weight: 58 kg  Current weight: 71.2 kg   UOP: 1150 ml x 24 hrs   Medications: reviewed  Labs: CBG 80-127  Diet Order:   Diet Order              Diet NPO time specified  Diet effective midnight                   EDUCATION NEEDS:   Not appropriate for education at this time  Skin:  Skin Assessment: Skin Integrity Issues: Skin Integrity Issues:: DTI Stage II: buttocks Stage I: sacrum Incisions: abdomen/chest  Last BM:  8/21  Height:   Ht Readings from Last 1 Encounters:  05/04/21 '5\' 1"'$  (1.549 m)    Weight:   Wt Readings from Last 1 Encounters:  05/31/21 71.2 kg   BMI:  Body mass index is 29.66 kg/m.  Estimated Nutritional Needs:   Kcal:  1850-2050 kcal  Protein:  95-115 grams  Fluid:  >/= 1.8 L/day  Mariana Single MS, RD, LDN, CNSC Clinical Nutrition Pager listed in [place]

## 2021-06-03 NOTE — Care Management Important Message (Signed)
Important Message  Patient Details  Name: Dana Coleman MRN: [id number] Date of Birth: Sep 03, 1941   Medicare Important Message Given:  Yes     Shelda Altes 06/03/2021, 11:32 AM

## 2021-06-03 NOTE — Progress Notes (Signed)
Paused heparin gtt and resume it back around 2000 per MD.   Lavenia Atlas, RN

## 2021-06-03 NOTE — Progress Notes (Signed)
PT Cancellation Note  Patient Details Name: Dana Coleman MRN: [id number] DOB: 1940-12-02   Cancelled Treatment:    Reason Eval/Treat Not Completed: (P) Other (comment) (RN defer, pt being prepped for G-tube placement procedure.) Will continue efforts next date per PT POC as schedule permits.   Kara Pacer [name] 06/03/2021, 10:42 AM

## 2021-06-03 NOTE — Procedures (Signed)
Interventional Radiology Procedure Note  Procedure: Placement of percutaneous 16F balloon retention gastrostomy tube.  Complications: None  Recommendations: - NPO except for sips and chips remainder of today and overnight - Maintain G-tube to LWS until tomorrow morning  - May advance diet as tolerated and begin using tube tomorrow morning   Ruthann Cancer, MD

## 2021-06-03 NOTE — Progress Notes (Signed)
PHARMACY - TOTAL PARENTERAL NUTRITION CONSULT NOTE  Indication: s/p esophageal perforation and repair  Patient Measurements: Height: '5\' 1"'$  (154.9 cm) Weight: 71.2 kg (157 lb) IBW/kg (Calculated) : 47.8   Body mass index is 29.66 kg/m.  Assessment:  80 y/o female with recent lap chole for symptomatic cholelithiasis on 7/21. PMH including HTN, HLD, DM, R breast cancer, and CAD. Patient presented on 7/23 with AMS and unresponsiveness - was complaining of abdominal discomfort and then patient became unresponsive and slumped on table.  S/p gall bladder surgery and esophageal perforation-repaired 7/24.  Pharmacy consulted for TPN management.  Glucose / Insulin: DM with A1c 7.4% on Lantus 24/d PTA.  CBGs 68-140s on TPN,  Utilized 0 units sens SSI + 65 units insulin in TPN - decreased 8/20  Electrolytes: K 4.1, Na 137, CorCa wnl (8/22) Renal: septic ATN s/p CRRT (end 7/25) - SCr < 1 and stable, BUN at 27 Hepatic: LFTs significantly increased 8/4 (decreased with holding TPN for 6 hours and Lasix), tbili WNL, albumin down 1.9, prealbumin up 12.9, TG WNL Intake / Output; MIVF:  UOP ~500 ml/24hr (not all measured), LBM 8/21  GI Imaging: - 7/23 CT: no acute abnormalities  GI Surgeries / Procedures:  - 7/21: s/p laparoscopic cholecystectomy and LOA - 7/24: s/p R thoracotomy, repair of esophageal perf with muscle flap, drainage of empyema and decortication of R lung  Central access: PICC 7/26 (double lumen) TPN start date: 05/07/21  Nutritional Goals (per RD rec on 8/9): kCal: 1750-2030 , Protein: 90-120 , Fluid: >=1.8L  Current Nutrition:  TPN Ensure Enlive BID - refusing  8/12 Dysphagia 1 diet, little intake No oral feeding tube recommended due to esophageal injury F/u IR for possible gastrostomy tube planned for Mon (8/22)  Plan:  Continue concentrated cyclic TPN over 16 hr at 1575 mL over 16 hrs: 53 mL/hr x 1 hr, then 105 mL/hr x 14 hrs, then 53 mL/hr x 1 hr. TPN provides 111g AA,  230g CHO, and 53.6g ILE for total of 1765 kCal, meeting 100% of patient's needs. Electrolytes in TPN: Na 30 mEq/L (= 47 mEq), K 25 mEq/L (= 39 mEq), Ca 56mq/L since 7/30, Mg 11 mEq/L (= 17 mEq), Phos 30 mmol/L (= 47 mmol), maximum acetate Continue standard MVI and trace elements to TPN Continue sens SSI Q4H and decrease insulin in TPN to 50 units. Watch mid-day lower CBGs and encourage po intake TPN labs M,Th F/u G tube scheduled for today (8/22) along with TF initiation  EDonnald Garre PharmD Clinical Pharmacist  Please check AMION for all [place] After 10:00 PM, call [place]

## 2021-06-03 NOTE — Progress Notes (Signed)
Put heparin on hold per IR request. Pt going to IR today. Notified pharmacist.   Lavenia Atlas, RN

## 2021-06-04 DIAGNOSIS — K223 Perforation of esophagus: Secondary | ICD-10-CM | POA: Diagnosis not present

## 2021-06-04 LAB — GLUCOSE, CAPILLARY
Glucose-Capillary: 120 mg/dL — ABNORMAL HIGH (ref 70–99)
Glucose-Capillary: 131 mg/dL — ABNORMAL HIGH (ref 70–99)
Glucose-Capillary: 151 mg/dL — ABNORMAL HIGH (ref 70–99)
Glucose-Capillary: 163 mg/dL — ABNORMAL HIGH (ref 70–99)
Glucose-Capillary: 187 mg/dL — ABNORMAL HIGH (ref 70–99)

## 2021-06-04 LAB — HEPARIN LEVEL (UNFRACTIONATED)
Heparin Unfractionated: <0.1 IU/mL — ABNORMAL LOW (ref 0.30–0.70)
Heparin Unfractionated: 0.32 IU/mL (ref 0.30–0.70)

## 2021-06-04 MED ORDER — APIXABAN 5 MG PO TABS
5.0000 mg | ORAL_TABLET | Freq: Two times a day (BID) | ORAL | Status: DC
  Administered 2021-06-04 – 2021-06-17 (×27): 5 mg
  Filled 2021-06-04 (×27): qty 1

## 2021-06-04 MED ORDER — MORPHINE SULFATE (PF) 2 MG/ML IV SOLN
2.0000 mg | Freq: Once | INTRAVENOUS | Status: AC
  Administered 2021-06-04: 2 mg via INTRAVENOUS
  Filled 2021-06-04: qty 1

## 2021-06-04 MED ORDER — MORPHINE SULFATE (PF) 2 MG/ML IV SOLN
1.0000 mg | Freq: Once | INTRAVENOUS | Status: AC
  Administered 2021-06-04: 1 mg via INTRAVENOUS
  Filled 2021-06-04: qty 1

## 2021-06-04 MED ORDER — PANTOPRAZOLE SODIUM 40 MG PO PACK: 40.0000 mg | PACK | Freq: Every day | ORAL | Status: DC

## 2021-06-04 MED ORDER — OSMOLITE 1.5 CAL PO LIQD
1000.0000 mL | ORAL | Status: DC
  Administered 2021-06-04 – 2021-06-14 (×8): 1000 mL
  Filled 2021-06-04 (×16): qty 1000

## 2021-06-04 MED ORDER — MORPHINE SULFATE (PF) 2 MG/ML IV SOLN
1.0000 mg | INTRAVENOUS | Status: AC | PRN
  Administered 2021-06-04 (×2): 2 mg via INTRAVENOUS
  Filled 2021-06-04 (×2): qty 1

## 2021-06-04 MED ORDER — ENSURE ENLIVE PO LIQD
237.0000 mL | Freq: Two times a day (BID) | ORAL | Status: DC
  Administered 2021-06-04: 45 mL
  Administered 2021-06-05: 237 mL

## 2021-06-04 MED ORDER — METOPROLOL TARTRATE 25 MG PO TABS
25.0000 mg | ORAL_TABLET | Freq: Two times a day (BID) | ORAL | Status: DC
  Administered 2021-06-04 – 2021-06-08 (×8): 25 mg
  Filled 2021-06-04 (×8): qty 1

## 2021-06-04 MED ORDER — ACETAMINOPHEN 325 MG PO TABS
650.0000 mg | ORAL_TABLET | ORAL | Status: DC | PRN
  Administered 2021-06-04 – 2021-06-06 (×4): 650 mg
  Filled 2021-06-04 (×5): qty 2

## 2021-06-04 MED ORDER — MELATONIN 3 MG PO TABS
3.0000 mg | ORAL_TABLET | Freq: Every day | ORAL | Status: DC
  Administered 2021-06-04 – 2021-06-16 (×13): 3 mg
  Filled 2021-06-04 (×13): qty 1

## 2021-06-04 MED ORDER — SACUBITRIL-VALSARTAN 24-26 MG PO TABS
1.0000 | ORAL_TABLET | Freq: Two times a day (BID) | ORAL | Status: DC
  Administered 2021-06-04 – 2021-06-07 (×6): 1
  Filled 2021-06-04 (×6): qty 1

## 2021-06-04 MED ORDER — TRACE MINERALS CU-MN-SE-ZN 300-55-60-3000 MCG/ML IV SOLN
INTRAVENOUS | Status: AC
  Filled 2021-06-04: qty 745.53

## 2021-06-04 MED ORDER — PROSOURCE TF PO LIQD
45.0000 mL | Freq: Two times a day (BID) | ORAL | Status: DC
  Administered 2021-06-04 – 2021-06-14 (×21): 45 mL
  Filled 2021-06-04 (×20): qty 45

## 2021-06-04 NOTE — Progress Notes (Signed)
OT Cancellation Note  Patient Details Name: Dana Coleman MRN: [id number] DOB: [birth year]   Cancelled Treatment:    Reason Eval/Treat Not Completed: Pain limiting ability to participate;Fatigue/lethargy limiting ability to participate (Pt and RN requesting therapy come back in the a.m., pt with reports of pain and exhaustion. OT treatment to f/u as time and pt permit)  Kenzley Ke A Labrandon Knoch 06/04/2021, 4:35 PM

## 2021-06-04 NOTE — Progress Notes (Signed)
Brief Nutrition Follow Up  Patient had 22 fr PEG placed yesterday in IR. Okay to start using tube for feeds per MD. Titrate tube feeding to goal.    Tube feeding:  -Osmolite 1.5 @ 20 ml/hr -Increase by 10 ml Q4 hours to goal rate of 50 ml/hr (1200 ml) -ProSource TF 45 ml BID   At goal rate TF provides: 1880 kcals, 97 grams protein, 914 ml free water.  -Continue TPN to meet 100% estimated nutritional needs until patient demonstrates tolerance of tube feeding at goal rate.   Mariana Single MS, RD, LDN, CNSC Clinical Nutrition Pager listed in [place]

## 2021-06-04 NOTE — TOC Progression Note (Signed)
Transition of Care [facility]) - Progression Note    Patient Details  Name: Dana Coleman MRN: [id number] Date of Birth: August 09, 1941  Transition of Care [facility]) CM/SW [place], [place] Phone Number: 06/04/2021, 4:55 PM  Clinical Narrative:     CSW visit patent at bedside to finalize disposition plan. Patient was resting. CSW will attempt to revisit tomorrow.  CSW will continue to follow and assist with discharge planning.  Thurmond Butts, MSW, LCSW Clinical Social Worker    Expected Discharge Plan: Skilled Nursing Facility Barriers to Discharge: Continued Medical Work up, SNF Pending bed offer  Expected Discharge Plan and Services Expected Discharge Plan: [place] In-house Referral: Clinical Social Work     Living arrangements for the past 2 months: Single Family Home                                       Social Determinants of Health (SDOH) Interventions    Readmission Risk Interventions No flowsheet data found.

## 2021-06-04 NOTE — Progress Notes (Signed)
Inpatient Rehabilitation Admissions Coordinator   Inpatient rehab re consulted. I reviewed past weeks therapy treatments. Patient continues to not demonstrate tolerance to intensive therapies required of an inpt rehab admission. We will not pursue a CIR admit. I continue to recommended SNF rehab. Please call me with any questions.  Danne Baxter, RN, MSN Rehab Admissions Coordinator [phone] 06/04/2021 7:33 PM

## 2021-06-04 NOTE — Progress Notes (Addendum)
Family Medicine Teaching Service Daily Progress Note Intern Pager: [phone]  Patient name: Dana Coleman Medical record number: [id number] Date of birth: 1941-01-24 Age: 80 y.o. Gender: female  Primary Care Provider: Gifford Shave, MD Consultants: GI, CVTS, pulmonology, cardiology, IR Code Status: Partial code  Pt Overview and Major Events to Date:  7/23 admitted to ICU, intubated, right pigtail catheter placed 7/24- CRRT, emergent right thoracotomy, repair of esophageal perforation with intercostal pedicled muscle flap and drainage of empyema and decortication of right lung 7/25- NSTEMI, no intervention 7/26- extubated, TPN started 8/1- esophagram without leak, PO started, heparin gtt d/c because of increased bleeding from chest tubes. Transfused 1u pRBC 8/3: anterior chest tube removed 8/4: remaining chest tube removed 8/8: increased O2 requirement, Afib RVR -> PE with right heart strain 8/9: further increased O2 requirement, Afib 8/13: started Entresto, decreasing supplemental oxygen requirement 8/22: G-tube placed  Assessment and Plan: Dana Coleman is a 80 year old female admitted for empyema following esophageal perforation, s/p repair, NSTEMI on 7/25, new onset A. fib and PE on 05/20/2021.  Past medical history significant for hypertension, cholecystectomy, type 2 diabetes mellitus, breast cancer.  Protein calorie malnutrition/deconditioning, s/p G-tube placement POD #1.  Successful placement of gastrostomy tube yesterday by IR.  No nausea or vomiting this morning.  Patient had diffuse abdominal pain overnight which was responsive to 2 mg morphine x2.  No significant pain this morning.  Today, we will transition to using the G-tube. -Advance diet as tolerated, start using G-tube today.  The ultimate goal now that G-tube has been placed is to pursue CIR, to which I will speak with him today about possible placement.  Per RD, we will start with Osmolite 1.5 at 20 mL/h,  increasing by 10 mL every 4 hours to reach goal of 50 mL/hour. -PT/OT -Tylenol 650 mg every 4 hours as needed for pain - Nutrition following, appreciate recommendations -Start G-tube feeds: Osmolite 1.5 at 20 mL/hour, increase by 10 every 4 hours -I's and O's  Pulmonary embolism No increased work of breathing or shortness of breath.  Patient on 0.5 L nasal cannula maintaining oxygen saturations in the upper 90s.  Normal respiratory rate.  We resumed IV heparin after placement of G-tube yesterday, but will transition to p.o. Eliquis this morning. -Continue to monitor respiratory status, goal O2 sat > than 92% -Wean oxygen as tolerated -Discontinue IV heparin  -Start PO '5mg'$  Eliquis BID  A. fib Heart rate 80s to 90s. -Continue Entresto -Continue metoprolol tartrate 25 mg twice daily  T2DM CBG ranges 100-180 -CBG daily  FEN/GI: G-tube-Osmolite PPx: Eliquis Dispo:CIR pending clinical improvement . Barriers include tolerance of G-tube feeds.   Subjective:  Dana Coleman feels well this morning.  She has no complaints.  She was resting in the bed and answers in short sentences.  No nausea, vomiting, diarrhea.  Objective: Temp:  [97.7 F (36.5 C)-99.1 F (37.3 C)] 98.1 F (36.7 C) (08/23 0811) Pulse Rate:  [84-112] 91 (08/22 2356) Resp:  [17-27] 20 (08/23 0811) BP: (107-134)/(61-85) 113/61 (08/23 0811) SpO2:  [93 %-98 %] 98 % (08/23 0811) Physical Exam: General: Well-appearing, non-toxic, resting comfortably in bed Cardiovascular: Regular rate and rhythm, no murmurs appreciated Respiratory: Clear to auscultation, no wheezing or crackles.  Diminished breath sounds on the right Abdomen: Tender to palpation in left lower quadrant.  G-tube in place with dressing dry and intact.  Soft.  Nondistended. Extremities: Warm, dry, well perfused.  Dorsalis pedis pulses 2+ bilaterally, no lower extremity edema.  Laboratory:  Recent Labs  Lab 05/31/21 0603 06/02/21 0631 06/03/21 0500  WBC  10.8* 10.0 9.3  HGB 10.1* 10.0* 9.7*  HCT 31.0* 30.5* 29.9*  PLT 270 240 241   Recent Labs  Lab 05/31/21 0603 06/02/21 0631 06/03/21 0500  NA 135 136 137  K 4.2 4.0 4.1  CL 102 105 106  CO2 '26 25 25  '$ BUN 30* 30* 27*  CREATININE 0.58 0.59 0.59  CALCIUM 8.5* 8.3* 8.4*  GLUCOSE 108* 113* 68*      Imaging/Diagnostic Tests: IR GASTROSTOMY TUBE MOD SED  Result Date: 06/03/2021 INDICATION: 80 year old female with history of recent esophageal perforation status post muscle flap repair with residual dysphagia and associated malnutrition. Presents for enteric access. EXAM: PERC PLACEMENT GASTROSTOMY MEDICATIONS: Ancef 2 gm IV; Antibiotics were administered within 1 hour of the procedure. ANESTHESIA/SEDATION: Versed 1 mg IV; Fentanyl 50 mcg IV Moderate Sedation Time:  12 The patient was continuously monitored during the procedure by the interventional radiology nurse under my direct supervision. CONTRAST:  20 mL Omnipaque 350-administered into the gastric lumen. FLUOROSCOPY TIME:  Fluoroscopy Time: 1 minutes 12 seconds (17 mGy). COMPLICATIONS: None immediate. PROCEDURE: Informed written consent was obtained from the patient after a thorough discussion of the procedural risks, benefits and alternatives. All questions were addressed. Maximal Sterile Barrier Technique was utilized including caps, mask, sterile gowns, sterile gloves, sterile drape, hand hygiene and skin antiseptic. A timeout was performed prior to the initiation of the procedure. The patient was placed on the procedure table in the supine position. Pre-procedure abdominal film confirmed visualization of the transverse colon. An angled 5-French catheter was passed through the nares into the stomach. The patient was prepped and draped in usual sterile fashion. The stomach was insufflated with air via the indwelling nasogastric tube. Under fluoroscopy, a puncture site was selected and local analgesia achieved with 1% lidocaine infiltrated  subcutaneously. Under fluoroscopic guidance, a gastropexy needle was passed into the stomach and the T-bar suture was released. Entry into the stomach was confirmed with fluoroscopy, aspiration of air, and injection of contrast material. This was repeated with an additional gastropexy suture (for a total of 2 fasteners). At the center of these gastropexy sutures, a dermatotomy was performed. An 18 gauge needle was passed into the stomach atthe site of this dermatotomy, and position within the gastric lumen again confirmed under fluoroscopy using aspiration of air and contrast injection. An Amplatz guidewire was passed through this needle and intraluminal placement within the stomach was onfirmed by fluoroscopy. The needle was removed. Over the guidewire, the percutaneous tract was dilated using a 10 mm non-compliant balloon. The balloon was deflated, then pushed into the gastric lumen followed in concert by the 22 [place] gastrostomy tube. The retention balloon of the percutaneous gastrostomy tube as inflated with 10 mL of sterile water. The tube was withdrawn until the retention balloon was at the edge of the gastric lumen. The external bumper was brought to the abdominal wall. Contrast was injected through the gastrostomy tube, confirming intraluminal positioning. The patient tolerated the procedure well without any immediate post-procedural complications. IMPRESSION: Technically successful placement of 22 Fr balloon retention gastrostomy tube with absorbable gastropexy sutures. Ruthann Cancer, MD [facility] Radiology Electronically Signed   By: Ruthann Cancer M.D.   On: 06/03/2021 14:26     Orvis Brill, DO 06/04/2021, 9:05 AM PGY-1, [place] Intern pager: [phone], text pages welcome

## 2021-06-04 NOTE — Progress Notes (Signed)
  Speech Language Pathology Treatment: Dysphagia  Patient Details Name: Dana Coleman MRN: [id number] DOB: 08/01/1941 Today's Date: 06/04/2021 Time: [id number] SLP Time Calculation (min) (ACUTE ONLY): 11 min  Assessment / Plan / Recommendation Clinical Impression  Pt was seen for ongoing swallowing tx, noting that she remains NPO since PEG placement. Per MD, no medical reason for PO diet not to be resumed. SLP provided bites of soft solid foods, which pt consumed with quite prolonged mastication but no oral residue today. She does not want very much at all though. Discussed diet textures with her - she liked the minced textures she was previously on, thinking that they were more appealing than the purees but also acknowledging that she still does not want anything more solid to have to chew. Will resume Dys 2 diet and thin liquids. Will f/u briefly, although if this remains her preferred diet, it may be more appropriate for SLP f/u at next level of care to work on progression as she continues to build her overall strength and endurance.    HPI HPI: Pt is a 80 yo female admitted on 05/04/21 for AMS. HX of cholecystectomy on 7/21.  Intubated upon admission, 7/24 Started CRRT, Esophageal perforation with septic shock post flap repair, drainage of empyema and decortication of R lung with chest tube placed.  7/25 EKG changes and probable nstemi. Extubated 7/26. Esophagram 8/1 cleared to start PO diet. CT removed 8/4. Pt with concern for aspiration 8/8 and made NPO. Pt also with new PE with R heart strain and increased O2 HFNC requirements 8/8. PMH includes: R breast CA, DM, CAD, HTN.      SLP Plan  Continue with current plan of care       Recommendations  Diet recommendations: Dysphagia 2 (fine chop);Thin liquid Liquids provided via: Cup;Straw Medication Administration: Crushed with puree Supervision: Staff to assist with self feeding Compensations: Slow rate;Small sips/bites Postural Changes  and/or Swallow Maneuvers: Seated upright 90 degrees;Upright 30-60 min after meal                Oral Care Recommendations: Oral care QID Follow up Recommendations: Skilled Nursing facility SLP Visit Diagnosis: Dysphagia, oral phase (R13.11) Plan: Continue with current plan of care       GO                Osie Bond., M.A. [place] [facility] Pager [phone] Office [phone]  06/04/2021, 3:49 PM

## 2021-06-04 NOTE — Progress Notes (Signed)
ANTICOAGULATION CONSULT NOTE - Follow Up Consult  Pharmacy Consult for Heparin Indication: atrial fibrillation and pulmonary embolus  No Known Allergies  Patient Measurements: Height: '5\' 1"'$  (154.9 cm) Weight: 71.2 kg (157 lb) IBW/kg (Calculated) : 47.8 Heparin Dosing Weight: 62.6 kg  Vital Signs: Temp: 98.3 F (36.8 C) (08/22 2356) Temp Source: Oral (08/22 2356) BP: 125/69 (08/22 2356) Pulse Rate: 91 (08/22 2356)  Labs: Recent Labs    06/02/21 0631 06/02/21 0900 06/02/21 2128 06/03/21 0500 06/03/21 1036 06/04/21 0108  HGB 10.0*  --   --  9.7*  --   --   HCT 30.5*  --   --  29.9*  --   --   PLT 240  --   --  241  --   --   APTT  --  57* 80* 114*  --   --   LABPROT  --   --   --   --  15.7*  --   INR  --   --   --   --  1.3*  --   HEPARINUNFRC  --  0.31  --  0.57  --  <0.10*  CREATININE 0.59  --   --  0.59  --   --      Estimated Creatinine Clearance: 51.5 mL/min (by C-G formula based on SCr of 0.59 mg/dL).  Assessment: 80yo female with moderate BL acute PE with RHS 8/8 and atrial fibrillation (no anticoagulation PTA).  Patient with complicated hospital course including empyema, acute renal failure, NSTEMI, and PE.  Patient was on Apixaban from 8/12 to 8/18 AM.  Pharmacy has been consulted for heparin dosing.   Heparin held 8/22 for G-tube placement, restart 8/22 ~2040. Heparin level drawn ~4 hour post gtt start so not surprised that it is undetectable. RN reports no issues with heparin infusion.  Goal of Therapy:  Heparin level 0.3-0.7 units/ml aPTT 66-102 seconds Monitor platelets by anticoagulation protocol: Yes   Plan:  Continue heparin infusion at 1400 units/hr  Will f/u 8hr heparin level from when gtt started  Sherlon Handing, PharmD, BCPS Please see amion for complete clinical pharmacist phone list 06/04/2021 2:42 AM

## 2021-06-04 NOTE — Progress Notes (Signed)
FPTS Brief Progress Note  S Patient was sleeping comfortably this evening. I did not wake the patient. No concerns from night RN.   O: BP 112/71 (BP Location: Right Arm)   Pulse 83   Temp 97.6 F (36.4 C) (Oral)   Resp 20   Ht '5\' 1"'$  ([id number] m)   Wt 71.2 kg   SpO2 96%   BMI 29.66 kg/m    General: sleeping comfortably  A/P: Plan per day team  -Meds per G-tube  -Dysphagia 2 diet  - Orders reviewed. Labs for AM ordered, which was adjusted as needed.   Lattie Haw, MD 06/04/2021, 8:16 PM PGY-3, [facility] Family Medicine Night Resident  Please page [phone] with questions.

## 2021-06-04 NOTE — Progress Notes (Signed)
Referring Physician(s): Dr Lattie Haw  Supervising Physician: Jacqulynn Cadet  Patient Status:  [facility] - In-pt  Chief Complaint:  Dysphagia G tube placed in IR 8/22  Subjective:  Procedure: Placement of percutaneous 67F balloon retention gastrostomy tube G tube in place Sore per pt-- minimal pain expected  Site looks wnl  Allergies: Patient has no known allergies.  Medications: Prior to Admission medications   Medication Sig Start Date End Date Taking? Authorizing Provider  amLODipine (NORVASC) 10 MG tablet TAKE 1 TABLET BY MOUTH EVERY DAY Patient taking differently: Take 10 mg by mouth daily. 07/20/20  Yes Gifford Shave, MD  aspirin EC 81 MG tablet Take 81 mg by mouth in the morning. Swallow whole.   Yes [provider]  Cholecalciferol (VITAMIN D3) 50 MCG (2000 UT) TABS Take 2,000 Units by mouth in the morning.   Yes [provider]  hydrochlorothiazide (HYDRODIURIL) 12.5 MG tablet TAKE 1 TABLET BY MOUTH EVERY DAY Patient taking differently: Take 12.5 mg by mouth daily. 09/25/20  Yes Gifford Shave, MD  JARDIANCE 10 MG TABS tablet TAKE 1 TABLET BY MOUTH EVERY DAY Patient taking differently: Take 10 mg by mouth daily. 12/05/20  Yes Gifford Shave, MD  LANTUS SOLOSTAR 100 UNIT/ML Solostar Pen INJECT 24 UNITS INTO THE SKIN EVERY MORNING. Patient taking differently: Inject 24 Units into the skin daily. 12/31/20  Yes Gifford Shave, MD  losartan (COZAAR) 50 MG tablet Take 50 mg by mouth in the morning. 05/24/19  Yes [provider]  metFORMIN (GLUCOPHAGE-XR) 500 MG 24 hr tablet TAKE 1 TABLET BY MOUTH TWICE A DAY WITH BREAKFAST AND DINNER Patient taking differently: Take 500 mg by mouth 2 (two) times daily. 03/25/21  Yes Gifford Shave, MD  nitroGLYCERIN (NITROSTAT) 0.4 MG SL tablet Place 1 tablet (0.4 mg total) under the tongue every 5 (five) minutes as needed for chest pain. Patient taking differently: Place 0.4 mg under the tongue every 5  (five) minutes x 3 doses as needed for chest pain. 07/12/19  Yes Hensel, Jamal Collin, MD  omeprazole (PRILOSEC) 40 MG capsule Take 40 mg by mouth every morning. 12/12/20  Yes [provider]  Blood Glucose Monitoring Suppl MISC One touch ultra glucose monitor. Check blood sugar twice a day.    [provider]  glucose blood (ONE TOUCH ULTRA TEST) test strip CHECK BLOOD SUGAR TWICE DAILY 09/24/18   Everrett Coombe, MD  Insulin Pen Needle (B-D UF III MINI PEN NEEDLES) 31G X 5 MM MISC INJECT INSULIN VIA PEN 6 TIMES DAILY 02/22/18   Everrett Coombe, MD  OneTouch Delica Lancets [id number] MISC 1 Container by Does not apply route as needed. 06/07/19   Zenia Resides, MD  rosuvastatin (CRESTOR) 20 MG tablet Take 1 tablet (20 mg total) by mouth at bedtime. 05/24/21   Lurline Del, DO     Vital Signs: BP 107/68 (BP Location: Right Arm)   Pulse 91   Temp 97.9 F (36.6 C) (Oral)   Resp (!) 25   Ht '5\' 1"'$  (1.549 m)   Wt 157 lb (71.2 kg)   SpO2 96%   BMI 29.66 kg/m   Physical Exam Skin:    General: Skin is warm.     Comments: Site is clean and dry Tender to touch +BS No hematoma Afeb    Imaging: DG Chest 1 View  Result Date: 06/02/2021 CLINICAL DATA:  New left picc EXAM: CHEST  1 VIEW COMPARISON:  the previous day's study FINDINGS: Relatively low lung  volumes. Stable left retrocardiac consolidation. Patchy interstitial and airspace opacities in the right lung involving the base more than the upper lobe as before. Left arm PICC line now extends to mid SVC. Heart size upper limits normal. Aortic Atherosclerosis (ICD10-170.0). Blunting of lateral costophrenic angles suggesting small effusions. No pneumothorax. Visualized bones unremarkable. IMPRESSION: 1. PICC line to the mid SVC. 2. Stable asymmetric airspace infiltrates with small effusions. Electronically Signed   By: Lucrezia Europe M.D.   On: 06/02/2021 16:30   IR GASTROSTOMY TUBE MOD SED  Result Date: 06/03/2021 INDICATION: 80 year old female  with history of recent esophageal perforation status post muscle flap repair with residual dysphagia and associated malnutrition. Presents for enteric access. EXAM: PERC PLACEMENT GASTROSTOMY MEDICATIONS: Ancef 2 gm IV; Antibiotics were administered within 1 hour of the procedure. ANESTHESIA/SEDATION: Versed 1 mg IV; Fentanyl 50 mcg IV Moderate Sedation Time:  12 The patient was continuously monitored during the procedure by the interventional radiology nurse under my direct supervision. CONTRAST:  20 mL Omnipaque 350-administered into the gastric lumen. FLUOROSCOPY TIME:  Fluoroscopy Time: 1 minutes 12 seconds (17 mGy). COMPLICATIONS: None immediate. PROCEDURE: Informed written consent was obtained from the patient after a thorough discussion of the procedural risks, benefits and alternatives. All questions were addressed. Maximal Sterile Barrier Technique was utilized including caps, mask, sterile gowns, sterile gloves, sterile drape, hand hygiene and skin antiseptic. A timeout was performed prior to the initiation of the procedure. The patient was placed on the procedure table in the supine position. Pre-procedure abdominal film confirmed visualization of the transverse colon. An angled 5-French catheter was passed through the nares into the stomach. The patient was prepped and draped in usual sterile fashion. The stomach was insufflated with air via the indwelling nasogastric tube. Under fluoroscopy, a puncture site was selected and local analgesia achieved with 1% lidocaine infiltrated subcutaneously. Under fluoroscopic guidance, a gastropexy needle was passed into the stomach and the T-bar suture was released. Entry into the stomach was confirmed with fluoroscopy, aspiration of air, and injection of contrast material. This was repeated with an additional gastropexy suture (for a total of 2 fasteners). At the center of these gastropexy sutures, a dermatotomy was performed. An 18 gauge needle was passed into the  stomach atthe site of this dermatotomy, and position within the gastric lumen again confirmed under fluoroscopy using aspiration of air and contrast injection. An Amplatz guidewire was passed through this needle and intraluminal placement within the stomach was onfirmed by fluoroscopy. The needle was removed. Over the guidewire, the percutaneous tract was dilated using a 10 mm non-compliant balloon. The balloon was deflated, then pushed into the gastric lumen followed in concert by the 22 [place] gastrostomy tube. The retention balloon of the percutaneous gastrostomy tube as inflated with 10 mL of sterile water. The tube was withdrawn until the retention balloon was at the edge of the gastric lumen. The external bumper was brought to the abdominal wall. Contrast was injected through the gastrostomy tube, confirming intraluminal positioning. The patient tolerated the procedure well without any immediate post-procedural complications. IMPRESSION: Technically successful placement of 22 Fr balloon retention gastrostomy tube with absorbable gastropexy sutures. Ruthann Cancer, MD [facility] Radiology Electronically Signed   By: Ruthann Cancer M.D.   On: 06/03/2021 14:26   DG CHEST PORT 1 VIEW  Result Date: 06/02/2021 CLINICAL DATA:  SOB (shortness of breath) EXAM: PORTABLE CHEST - 1 VIEW COMPARISON:  05/22/2021 FINDINGS: Stable left retrocardiac consolidation/atelectasis. Mild infiltrates throughout the  right lung have slightly improved since previous study. Heart size upper limits normal. Aortic Atherosclerosis (ICD10-170.0). Left arm PICC line malposition, tip directed retrograde into the right brachiocephalic vein. Right lateral pleural thickening or loculated effusion. No pneumothorax.  Lateral right rib fractures. Surgical clips at the thoracic inlet on the right. IMPRESSION: 1. Partial improvement in diffuse right lung infiltrates or edema. 2. Persistent left  retrocardiac consolidation/atelectasis. 3. Malposition PICC line as above. Electronically Signed   By: Lucrezia Europe M.D.   On: 06/02/2021 12:10    Labs:  CBC: Recent Labs    05/27/21 0410 05/31/21 0603 06/02/21 0631 06/03/21 0500  WBC 9.3 10.8* 10.0 9.3  HGB 10.1* 10.1* 10.0* 9.7*  HCT 31.4* 31.0* 30.5* 29.9*  PLT 323 270 240 241    COAGS: Recent Labs    05/05/21 0001 05/05/21 0803 05/30/21 2101 06/02/21 0009 06/02/21 0900 06/02/21 2128 06/03/21 0500 06/03/21 1036  INR 1.9* 1.9*  --   --   --   --   --  1.3*  APTT 24 31   < > 65* 57* 80* 114*  --    < > = values in this interval not displayed.    BMP: Recent Labs    08/14/20 1415 01/21/21 1115 05/30/21 0426 05/31/21 0603 06/02/21 0631 06/03/21 0500  NA 141   < > 137 135 136 137  K 4.5   < > 4.5 4.2 4.0 4.1  CL 103   < > 103 102 105 106  CO2 24   < > '27 26 25 25  '$ GLUCOSE 129*   < > 84 108* 113* 68*  BUN 10   < > 28* 30* 30* 27*  CALCIUM 9.6   < > 8.6* 8.5* 8.3* 8.4*  CREATININE 0.89   < > 0.62 0.58 0.59 0.59  GFRNONAA 62   < > >60 >60 >60 >60  GFRAA 72  --   --   --   --   --    < > = values in this interval not displayed.    LIVER FUNCTION TESTS: Recent Labs    05/22/21 0554 05/23/21 0434 05/24/21 0500 05/25/21 0213  BILITOT 0.6 0.4 0.4 0.4  AST 32 '28 28 28  '$ ALT 37 '30 27 26  '$ ALKPHOS 70 65 62 59  PROT 6.2* 5.8* 5.9* 6.0*  ALBUMIN 2.0* 1.9* 1.8* 1.9*    Assessment and Plan:  Percutaneous G tube placed in IR 8/22 Site is tender per pt--- somewhat expected C/d/I No hematoma Afeb If pain continues-- please let IT know May use now  Electronically Signed: Lavonia Drafts, PA-C 06/04/2021, 7:31 AM   I spent a total of 15 Minutes at the the patient's bedside AND on the patient's hospital floor or unit, greater than 50% of which was counseling/coordinating care for perc g tube

## 2021-06-04 NOTE — Progress Notes (Signed)
Physical Therapy Treatment Patient Details Name: Dana Coleman MRN: [id number] DOB: January 30, 1941 Today's Date: 06/04/2021    History of Present Illness Pt is a 80 yo female admitted on 05/04/21 for AMS. HX of cholecystectomy on 7/21.  Intubated upon admission, 7/24 Started CRRT, Esophageal perforation with septic shock post flap repair, drainage of empyema and decortication of R lung with chest tube placed.  7/25 EKG changes and probable nstemi. Extubated 7/26. CT removed 8/4. Pt with new PE with R heart strain and increased O2 HFNC requirements 8/8. Plan for possible G-tube placement 8/19. PMH includes: R breast CA, DM, CAD, HTN.    PT Comments    Pt with significant L abdominal pain at g-tube site greatly limiting mobility at this time. Despite pain pt completed transfer to EOB and side steps to [facility] with modAx2. Suspect once pain subsides or is under control pt with progress well however pt will requiring increased time as pt has had a prolonged hospital stay. Continue to recommend SNF upon d/c. Acute PT to cont to follow.    Follow Up Recommendations  SNF;Supervision/Assistance - 24 hour     Equipment Recommendations   (TBD at next venue)    Recommendations for Other Services       Precautions / Restrictions Precautions Precautions: Fall Precaution Comments: watch HR Restrictions Weight Bearing Restrictions: No    Mobility  Bed Mobility Overal bed mobility: Needs Assistance Bed Mobility: Rolling;Sidelying to Sit;Sit to Sidelying Rolling: Mod assist;+2 for physical assistance Sidelying to sit: Mod assist;+2 for physical assistance;HOB elevated   Sit to supine: Max assist;+2 for physical assistance   General bed mobility comments: pt initiated LE movement towards EOB modA to bring R UE over towards L flank for bed rail, modA for trunk elevation and bring LEs off EOB    Transfers Overall transfer level: Needs assistance Equipment used: Rolling walker (2  wheeled) Transfers: Sit to/from Stand Sit to Stand: Min assist;Mod assist;+2 physical assistance         General transfer comment: pt with good power up with modAx2 then minAx2 to maitnain standing, pt then able to side step to HOB x 5 steps with modAx2, HR increased to 110s, c/o pain and fatigue  Ambulation/Gait                 Stairs             Wheelchair Mobility    Modified Rankin (Stroke Patients Only)       Balance Overall balance assessment: Needs assistance Sitting-balance support: Feet supported;Bilateral upper extremity supported Sitting balance-Leahy Scale: Poor Sitting balance - Comments: pt with pain and relying on UEs to support to minimize trunk dependence   Standing balance support: Bilateral upper extremity supported Standing balance-Leahy Scale: Poor Standing balance comment: dependent on physical assist                            Cognition Arousal/Alertness: Awake/alert Behavior During Therapy: Flat affect Overall Cognitive Status: No family/caregiver present to determine baseline cognitive functioning                                 General Comments: pt followed all commands but flat, pt with pain but gave good effort with therapy, pt appears to have depressed mood/spirits      Exercises      General Comments General comments (skin integrity,  edema, etc.): VSS, HR slightly elevated with mobility however pt also in significant pain      Pertinent Vitals/Pain Pain Assessment: 0-10 Pain Score: 10-Worst pain ever Pain Location: L abdomen at G-tube site Pain Descriptors / Indicators: Cramping Pain Intervention(s): Limited activity within patient's tolerance    Home Living                      Prior Function            PT Goals (current goals can now be found in the care plan section) Acute Rehab PT Goals Patient Stated Goal: stop the pain Progress towards PT goals: Progressing toward  goals    Frequency    Min 3X/week      PT Plan Current plan remains appropriate    Co-evaluation              AM-PAC PT "6 Clicks" Mobility   Outcome Measure  Help needed turning from your back to your side while in a flat bed without using bedrails?: A Lot Help needed moving from lying on your back to sitting on the side of a flat bed without using bedrails?: A Lot Help needed moving to and from a bed to a chair (including a wheelchair)?: A Lot Help needed standing up from a chair using your arms (e.g., wheelchair or bedside chair)?: A Lot Help needed to walk in hospital room?: A Lot Help needed climbing 3-5 steps with a railing? : Total 6 Click Score: 11    End of Session Equipment Utilized During Treatment: Oxygen Activity Tolerance: Patient limited by fatigue;Patient limited by pain Patient left: in bed;with call bell/phone within reach;with bed alarm set;with nursing/sitter in room Nurse Communication: Mobility status PT Visit Diagnosis: Other abnormalities of gait and mobility (R26.89);Muscle weakness (generalized) (M62.81) Pain - Right/Left: Left Pain - part of body:  (abdomen)     Time: [id number] PT Time Calculation (min) (ACUTE ONLY): 20 min  Charges:  $Therapeutic Activity: 8-22 mins                     Kittie Plater, PT, DPT Acute Rehabilitation Services Pager #: [phone] Office #: [phone]    Dana Coleman 06/04/2021, 1:59 PM

## 2021-06-04 NOTE — Progress Notes (Addendum)
FPTS Interim Progress Note  S: Paged to see patient due to 10/10 LUQ pain. Pt was in moderate distress due to pain. She reports the morphine given to her early helped with the pain and is requesting more. Denies nausea or vomiting.   O: BP 125/69 (BP Location: Right Arm)   Pulse 91   Temp 98.3 F (36.8 C) (Oral)   Resp (!) 27   Ht '5\' 1"'$  (1.549 m)   Wt 71.2 kg   SpO2 95%   BMI 29.66 kg/m    General: Alert, distress due to pain  Cardio: well perfused  Pulm: normal work of breathing Abdomen: Bowel sounds normal. Abdomen soft, diffusely tender all over on mild palpation. No guarding. G tube site clean and dry, no erythema, edema or drainage Neuro: Cranial nerves grossly intact   A/P: Pain likely 2/2 g tube placement yesterday  -Morphine 1-'2mg'$  Q4H for 2 doses -Day team to reassess pain -If pain worsens consider CT abdomen imaging and discussing with IR team  Lattie Haw, MD 06/04/2021, 3:41 AM PGY-3, [place] Medicine Service pager [phone]

## 2021-06-04 NOTE — Progress Notes (Signed)
PHARMACY - TOTAL PARENTERAL NUTRITION CONSULT NOTE  Indication: s/p esophageal perforation and repair  Patient Measurements: Height: '5\' 1"'$  (154.9 cm) Weight: 71.2 kg (157 lb) IBW/kg (Calculated) : 47.8   Body mass index is 29.66 kg/m.  Assessment:  80 y/o female with recent lap chole for symptomatic cholelithiasis on 7/21. PMH including HTN, HLD, DM, R breast cancer, and CAD. Patient presented on 7/23 with AMS and unresponsiveness - was complaining of abdominal discomfort and then patient became unresponsive and slumped on table.  S/p gall bladder surgery and esophageal perforation-repaired 7/24.  Pharmacy consulted for TPN management.  Glucose / Insulin: DM with A1c 7.4% on Lantus 24/d PTA.  CBGs 68-140s on TPN,  Utilized 0 units sens SSI + 65 units insulin in TPN - decreased 8/20  Electrolytes: 8/22 labs- K 4.1, Na 137, CorCa wnl (8/22) Renal: septic ATN s/p CRRT (end 7/25) - SCr < 1 and stable, BUN at 27 Hepatic: LFTs significantly increased 8/4 (decreased with holding TPN for 6 hours and Lasix), tbili WNL, TG WNL Intake / Output; MIVF:  UOP 1.1L/24hr (not all measured), LBM 8/23  GI Imaging: - 7/23 CT: no acute abnormalities  GI Surgeries / Procedures:  - 7/21: s/p laparoscopic cholecystectomy and LOA - 7/24: s/p R thoracotomy, repair of esophageal perf with muscle flap, drainage of empyema and decortication of R lung 8/22 G tube placement  Central access: PICC 7/26 (double lumen) TPN start date: 05/07/21  Nutritional Goals (per RD rec on 8/9): kCal: 1750-2030 , Protein: 90-120 , Fluid: >=1.8L  Current Nutrition:  TPN Ensure Enlive BID - refusing  8/12 Dysphagia 1 diet, little intake No oral feeding tube recommended due to esophageal injury G-tube placed 8/22  Plan:  -Continue concentrated cyclic TPN over 16 hr at 1575 mL over 16 hrs: 53 mL/hr x 1 hr, then 105 mL/hr x 14 hrs, then 53 mL/hr x 1 hr. -TPN provides 111g AA, 230g CHO, and 53.6g ILE for total of 1765 kCal,  meeting 100% of patient's needs. -Electrolytes in TPN: Na 30 mEq/L (= 47 mEq), K 25 mEq/L (= 39 mEq), Ca 63mq/L since 7/30, Mg 11 mEq/L (= 17 mEq), Phos 30 mmol/L (= 47 mmol), maximum acetate -G-tube placed 8/22. Follow up TF tolerance to guide weaning of TPN. -Continue standard MVI and trace elements to TPN -Continue insulin in TPN to 50 units. Glucoses WNL -TPN labs M,Th  EDonnald Garre PharmD Clinical Pharmacist  Please check AMION for all [place] After 10:00 PM, call [phone]

## 2021-06-04 NOTE — Progress Notes (Addendum)
Family Medicine Teaching Service Daily Progress Note Intern Pager: [phone]  Patient name: Dana Coleman Medical record number: [id number] Date of birth: [birth year] Age: 80 y.o. Gender: female  Primary Care Provider: Gifford Shave, MD Consultants: GI, CVTS, Pulmonology, Cardiology, IR Code Status: Partial code  Pt Overview and Major Events to Date:  7/23 admitted to ICU, intubated, right pigtail catheter placed 7/24- CRRT, emergent right thoracotomy, repair of esophageal perforation with intercostal pedicled muscle flap and drainage of empyema and decortication of right lung 7/25- NSTEMI, no intervention 7/26- extubated, TPN started 8/1- esophagram without leak, PO started, heparin gtt d/c because of increased bleeding from chest tubes. Transfused 1u pRBC 8/3: anterior chest tube removed 8/4: remaining chest tube removed 8/8: increased O2 requirement, Afib RVR -> PE with right heart strain 8/9: further increased O2 requirement, Afib 8/13: started Entresto, decreasing supplemental oxygen requirement 8/22: G-tube placed  Assessment and Plan: Dana Coleman is a 80 year old female admitted for empyema following esophageal perforation, s/p repair, NSTEMI on 7/25, new onset A. Fib and PE on 05/20/2021. Past medical history significant for hypertension, cholecystectomy, type 2 diabetes mellitus, breast cancer.  Protein calorie malnutrition/deconditioning, s/p G tube placement POD day #2. No nausea or vomiting. Overnight did not require any pain meds.  This morning, patient is having significant abdominal pain with mild distention.  Administered 2 mg morphine x1.  We consulted with interventional radiology regarding this abdominal pain that is not abating and they recommended CT abdomen and we will follow-up after results return.  Tolerating medication and osmolite through tube. Receiving osmolite at 40 ml/hr. CIR denied admission as she continues to not do not demonstrate tolerance to  intensive therapies required of an inpatient rehab admission. -Follow-up CT abdomen results - Continue dysphagia 2 diet, per SLP - Advance G-tube osmolite by 10 mL every 4 hours to reach goal of 50 mL/hr - Tylenol '650mg'$  every 4 hours as needed for pain - Nutrition following, appreciate recommendations - I's and O's  LUQ/LLQ/Mid-abdominal pain  Tender to palpation on left side abdomen, especially near G-tube site, but no evidence of an acute abdomen. No sign of infection near G-tube with a clean, dry dressing intact. Osmolite infusing through tube. Will get CT abdomen w/o contrast. IR following s/p G-tube POD #2 and agree to continue with osmolite infusion. Potential causes include splenic infarct, splenic hemorrhage (on anticoagulation), SBO, pancreatitis, pneumonia (given history of esophageal perforation/pneumonia treated in ICU, extended hospital stay), PE with referred pain to diaphragm area. Will obtain labs, follow up with IR, and perform serial abdominal exams. - CBC - CMP - Lipase - Follow up CT abdomen - Serial abdominal exams  Pulmonary embolism No increased work of breathing or shortness of breath. Patient on room air maintaining oxygen saturations greater than 92%. Normal respiratory rate.  - Continue Elliquis '5mg'$  BID - Continue to monitor respiratory status, goal saturation >92%  A. Fib Heart rate 80s to 90s - Continue entresto - Continue metoprolol tartrate '25mg'$  twice daily  T2DM 120s-160s ranges CBG. Will likely add second agent in addition to SSI. -POC CBG -SSI resistant  FEN/GI: Dysphagia 2 diet, per SLP PPx: Elliquis Dispo:SNF  pending placement . Barriers include placement, G-tube toleration.   Subjective:  Dana Coleman says that she is not able to get any rest.  She is having significant diffuse abdominal pain, rated a 7 out of 10.  No nausea or vomiting.  Objective: Temp:  [97.6 F (36.4 C)-99.1 F (37.3 C)] 97.6 F (36.4 C) (08/23  1502) Pulse Rate:   [83-91] 83 (08/23 1502) Resp:  [20-27] 20 (08/23 1502) BP: (107-125)/(61-75) 112/71 (08/23 1502) SpO2:  [94 %-98 %] 96 % (08/23 1502) Physical Exam: General: Uncomfortable but nontoxic-appearing, laying in bed clutching her stomach Cardiovascular: Regular rate and rhythm, no murmurs appreciated Respiratory: Clear to auscultation, diminished breath sounds on right side, no wheezing or crackles Abdomen: Soft, tender to palpation in epigastrium and left upper and lower quadrants.  G-tube in place with dry dressing intact.  Mildly distended. Extremities: Warm, dry, well perfused.  No lower extremity edema  Laboratory: Recent Labs  Lab 05/31/21 0603 06/02/21 0631 06/03/21 0500  WBC 10.8* 10.0 9.3  HGB 10.1* 10.0* 9.7*  HCT 31.0* 30.5* 29.9*  PLT 270 240 241   Recent Labs  Lab 05/31/21 0603 06/02/21 0631 06/03/21 0500  NA 135 136 137  K 4.2 4.0 4.1  CL 102 105 106  CO2 '26 25 25  '$ BUN 30* 30* 27*  CREATININE 0.58 0.59 0.59  CALCIUM 8.5* 8.3* 8.4*  GLUCOSE 108* 113* 68*    Imaging/Diagnostic Tests: No results found.   Dana Brill, DO 06/04/2021, 5:38 PM PGY-1, [place] Intern pager: [phone], text pages welcome

## 2021-06-05 ENCOUNTER — Inpatient Hospital Stay (HOSPITAL_COMMUNITY): Payer: Medicare Other

## 2021-06-05 DIAGNOSIS — K223 Perforation of esophagus: Secondary | ICD-10-CM | POA: Diagnosis not present

## 2021-06-05 LAB — COMPREHENSIVE METABOLIC PANEL
ALT: 40 U/L (ref 0–44)
AST: 43 U/L — ABNORMAL HIGH (ref 15–41)
Albumin: 1.8 g/dL — ABNORMAL LOW (ref 3.5–5.0)
Alkaline Phosphatase: 66 U/L (ref 38–126)
Anion gap: 6 (ref 5–15)
BUN: 30 mg/dL — ABNORMAL HIGH (ref 8–23)
CO2: 24 mmol/L (ref 22–32)
Calcium: 8.4 mg/dL — ABNORMAL LOW (ref 8.9–10.3)
Chloride: 105 mmol/L (ref 98–111)
Creatinine, Ser: 0.62 mg/dL (ref 0.44–1.00)
GFR, Estimated: >60 mL/min (ref >60)
Glucose, Bld: 194 mg/dL — ABNORMAL HIGH (ref 70–99)
Potassium: 4.8 mmol/L (ref 3.5–5.1)
Sodium: 135 mmol/L (ref 135–145)
Total Bilirubin: 0.2 mg/dL — ABNORMAL LOW (ref 0.3–1.2)
Total Protein: 6.1 g/dL — ABNORMAL LOW (ref 6.5–8.1)

## 2021-06-05 LAB — GLUCOSE, CAPILLARY
Glucose-Capillary: 184 mg/dL — ABNORMAL HIGH (ref 70–99)
Glucose-Capillary: 184 mg/dL — ABNORMAL HIGH (ref 70–99)
Glucose-Capillary: 153 mg/dL — ABNORMAL HIGH (ref 70–99)
Glucose-Capillary: 97 mg/dL (ref 70–99)

## 2021-06-05 LAB — CBC WITH DIFFERENTIAL/PLATELET
Abs Immature Granulocytes: 0.03 10*3/uL (ref 0.00–0.07)
Basophils Absolute: 0 10*3/uL (ref 0.0–0.1)
Basophils Relative: 1 %
Eosinophils Absolute: 0.1 10*3/uL (ref 0.0–0.5)
Eosinophils Relative: 1 %
HCT: 29.4 % — ABNORMAL LOW (ref 36.0–46.0)
Hemoglobin: 9.6 g/dL — ABNORMAL LOW (ref 12.0–15.0)
Immature Granulocytes: 0 %
Lymphocytes Relative: 17 %
Lymphs Abs: 1.4 10*3/uL (ref 0.7–4.0)
MCH: 30.2 pg (ref 26.0–34.0)
MCHC: 32.7 g/dL (ref 30.0–36.0)
MCV: 92.5 fL (ref 80.0–100.0)
Monocytes Absolute: 0.9 10*3/uL (ref 0.1–1.0)
Monocytes Relative: 11 %
Neutro Abs: 6 10*3/uL (ref 1.7–7.7)
Neutrophils Relative %: 70 %
Platelets: 206 10*3/uL (ref 150–400)
RBC: 3.18 MIL/uL — ABNORMAL LOW (ref 3.87–5.11)
RDW: 18.7 % — ABNORMAL HIGH (ref 11.5–15.5)
WBC: 8.5 10*3/uL (ref 4.0–10.5)
nRBC: 0 % (ref 0.0–0.2)

## 2021-06-05 LAB — LIPASE, BLOOD: Lipase: 47 U/L (ref 11–51)

## 2021-06-05 MED ORDER — INSULIN ASPART 100 UNIT/ML IJ SOLN
0.0000 [IU] | Freq: Four times a day (QID) | INTRAMUSCULAR | Status: DC
  Administered 2021-06-05 – 2021-06-06 (×4): 4 [IU] via SUBCUTANEOUS

## 2021-06-05 MED ORDER — MORPHINE SULFATE (PF) 2 MG/ML IV SOLN
2.0000 mg | Freq: Once | INTRAVENOUS | Status: AC
  Administered 2021-06-05: 2 mg via INTRAVENOUS
  Filled 2021-06-05: qty 1

## 2021-06-05 MED ORDER — MORPHINE SULFATE (PF) 2 MG/ML IV SOLN
1.0000 mg | INTRAVENOUS | Status: AC | PRN
  Administered 2021-06-05 – 2021-06-06 (×3): 1 mg via INTRAVENOUS
  Filled 2021-06-05 (×3): qty 1

## 2021-06-05 MED ORDER — INSULIN ASPART 100 UNIT/ML IJ SOLN: 0.0000 [IU] | Freq: Four times a day (QID) | INTRAMUSCULAR | Status: DC

## 2021-06-05 NOTE — Progress Notes (Signed)
PT Cancellation Note  Patient Details Name: JIANA POPPERT MRN: [id number] DOB: [birth year]   Cancelled Treatment:    Reason Eval/Treat Not Completed: (P) Patient at procedure or test/unavailable (pt at CT imaging.) Will continue efforts per PT POC as schedule permits.   Kara Pacer Mathius Birkeland 06/05/2021, 12:16 PM

## 2021-06-05 NOTE — Progress Notes (Signed)
FPTS Brief Progress Note  S Saw patient at bedside this evening. Patient was awake and reporting pain at G tube site. RN has paused G tubes for now.    O: BP 111/72 (BP Location: Right Arm)   Pulse 95   Temp 98.5 F (36.9 C) (Oral)   Resp (!) 21   Ht '5\' 1"'$  (1.549 m)   Wt 71.2 kg   SpO2 99%   BMI 29.66 kg/m    General: Alert, mild  distress Cardio: well perfused  Pulm: normal work of breathing Abdomen: Abdomen soft, non distended, g tube site clean and dry  Neuro: Cranial nerves grossly intact   A/P: Plan per day team  -Morphine '1mg'$  Q3HPRN x 3 doses overnight for abdominal pain  -Restart G tube feeds 66mns-60 mins after morphine given  - Orders reviewed. Labs for AM ordered, which was adjusted as needed.    PLattie Haw MD 06/05/2021, 10:36 PM PGY-3, [facility] Night Resident  Please page [phone] questions.

## 2021-06-05 NOTE — Progress Notes (Signed)
PHARMACY - TOTAL PARENTERAL NUTRITION CONSULT NOTE  Indication: s/p esophageal perforation and repair  Patient Measurements: Height: '5\' 1"'$  (154.9 cm) Weight: 71.2 kg (157 lb) IBW/kg (Calculated) : 47.8   Body mass index is 29.66 kg/m.  Assessment:  80 y/o female with recent lap chole for symptomatic cholelithiasis on 7/21. PMH including HTN, HLD, DM, R breast cancer, and CAD. Patient presented on 7/23 with AMS and unresponsiveness - was complaining of abdominal discomfort and then patient became unresponsive and slumped on table.  S/p gall bladder surgery and esophageal perforation-repaired 7/24.  Pharmacy consulted for TPN management.  Glucose / Insulin: DM with A1c 7.4% on Lantus 24/d PTA.  CBGs 68-140s on TPN,  Utilized 0 units sens SSI + 65 units insulin in TPN - decreased 8/20  Electrolytes: 8/22 labs- K 4.1, Na 137, CorCa wnl (8/22) Renal: septic ATN s/p CRRT (end 7/25) - SCr < 1 and stable, BUN at 27 Hepatic: LFTs significantly increased 8/4 (decreased with holding TPN for 6 hours and Lasix), tbili WNL, TG WNL Intake / Output; MIVF:  UOP 1.L/24hr, LBM 8/23  GI Imaging: - 7/23 CT: no acute abnormalities  GI Surgeries / Procedures:  - 7/21: s/p laparoscopic cholecystectomy and LOA - 7/24: s/p R thoracotomy, repair of esophageal perf with muscle flap, drainage of empyema and decortication of R lung -8/22 G tube placement  Central access: PICC 7/26 (double lumen) TPN start date: 05/07/21  Nutritional Goals (per RD rec on 8/9): kCal: 1750-2030 , Protein: 90-120 , Fluid: >=1.8L  Tube feeding Recs:  -Osmolite 1.5 @ 20 ml/hr -Increase by 10 ml Q4 hours to goal rate of 50 ml/hr (1200 ml) -ProSource TF 45 ml BID -1880 kcals, 97 grams protein, 914 ml free water.  Current Nutrition:  Dysphagia diet- Cyclic TPN- TF '@40'$  ml/hr (goal 50 mlhr) -Complaining of abdominal pain so advancement is currently on hold.   Plan:  -Plan to stop TPN today after current bag ends -Will  transition to rSSI q6hr to cover TF.  Additional coverage per primary team.   Donnald Garre, PharmD Clinical Pharmacist  Please check AMION for all [place] numbers After 10:00 PM, call [place] [phone]

## 2021-06-05 NOTE — Progress Notes (Addendum)
Family Medicine Teaching Service Daily Progress Note Intern Pager: [phone]  Patient name: Dana Coleman Medical record number: [id number] Date of birth: 1940/12/18 Age: 80 y.o. Gender: female  Primary Care Provider: Gifford Shave, MD Consultants: GI, CVTS, Pulmonology, Cardiology, INR Code Status: Partial code  Pt Overview and Major Events to Date:  7/23 admitted to ICU, intubated, right pigtail catheter placed 7/24- CRRT, emergent right thoracotomy, repair of esophageal perforation with intercostal pedicled muscle flap and drainage of empyema and decortication of right lung 7/25- NSTEMI, no intervention 7/26- extubated, TPN started 8/1- esophagram without leak, PO started, heparin gtt d/c because of increased bleeding from chest tubes. Transfused 1u pRBC 8/3: anterior chest tube removed 8/4: remaining chest tube removed 8/8: increased O2 requirement, Afib RVR -> PE with right heart strain 8/9: further increased O2 requirement, Afib 8/13: started Entresto, decreasing supplemental oxygen requirement 8/22: G-tube placed  Assessment and Plan: Dana Coleman is a 80 year old female admitted for empyema following esophageal perforation, s/p repair, NSTEMI on 7/25, new onset A. fib and PE on 05/20/2021.  Past medical history significant for hypertension, cholecystectomy, type 2 diabetes mellitus, breast cancer  Protein calorie malnutrition/deconditioning, s/p G-tube placement POD day #3. Overnight, she had an episode of emesis, tube feed were held for and then resumed at 45m/hr. I spoke with RD and discontinued her Ensure as she does not tolerate this well, and they will further investigate for potential gastric emptying issues.We will follow-up with TOC on SNF placement as patient was denied CIR admission. -Continue dysphagia 2 diet, per SLP -Continue Osmolite at goal per tube -Take Tylenol 6 or 50 mg every 4 hours as needed for pain -Nutrition following, appreciate  recommendations -I's and O's  LUQ/LLQ abdominal pain Improved today.  No sign of infection near G-tube with clean, dry, dressing intact.  No signs of peritonitis. No guarding, rebound or changes in pain with deep inspiration. Lipase within normal limits at 47.  No elevated white blood cell count, WBC 7.7. CT abdomen/pelvis revealed small amount of intraperitoneal free air and free fluid, potentially related to gastrostomy tube placement, no evidence of obstruction. Pain is likely secondary to post-op discomfort. -Continue to monitor G-tube site for infection -Serial abdominal exams  Pulmonary embolism No increased work of breathing or shortness of breath.  Patient on room air maintaining oxygen saturations greater than 92%. Weaned patient from 1L to 0.5L [place] while I was in the room.  Normal respiratory rate. -Continue Eliquis 5 mg p.o. twice daily -Continue to monitor respiratory status  T2DM 101-180s Ranges CBG. -SSI resistant, will d/c -POC CBGs   FEN/GI: Dysphagia 2 diet, per SLP PPx: Eliquis Dispo:SNF  pending placement . Barriers include placement,G-tube toleration  Subjective:  Dana Coleman improved from yesterday. No nausea or vomiting this morning. Still having mild pain around G tube site. She has no questions or concerns. She appeared more well rested from yesterday morning.  Objective: Temp:  [98 F (36.7 C)-98.9 F (37.2 C)] 98.9 F (37.2 C) (08/24 0748) Pulse Rate:  [90-98] 98 (08/24 0748) Resp:  [18-30] 30 (08/24 0748) BP: (108-127)/(55-78) 127/78 (08/24 0748) SpO2:  [97 %-100 %] 97 % (08/24 0748) Physical Exam: General: Resting in bed comfortably, chronically-ill and weak appearing Cardiovascular: RRR Respiratory: CTAB, no wheezing or crackles Abdomen: G tube site covered with dry dressing. No redness or discharge from site. Tender to palpation in left quadrant around G-tube site, no rebound or guarding. No tenderness elsewhere in abdomen. No pain with deep  inspiration.  Soft, mildly distended Extremities: Warm, dry, well-perfused. No edema.  Laboratory: Recent Labs  Lab 06/02/21 0631 06/03/21 0500 06/05/21 1124  WBC 10.0 9.3 8.5  HGB 10.0* 9.7* 9.6*  HCT 30.5* 29.9* 29.4*  PLT 240 241 206   Recent Labs  Lab 06/02/21 0631 06/03/21 0500 06/05/21 1124  NA 136 137 135  K 4.0 4.1 4.8  CL 105 106 105  CO2 '25 25 24  '$ BUN 30* 27* 30*  CREATININE 0.59 0.59 0.62  CALCIUM 8.3* 8.4* 8.4*  PROT  --   --  6.1*  BILITOT  --   --  0.2*  ALKPHOS  --   --  66  ALT  --   --  40  AST  --   --  43*  GLUCOSE 113* 68* 194*     Imaging/Diagnostic Tests: No results found.   Orvis Brill, DO 06/05/2021, 3:04 PM PGY-1, [place] Intern pager: [phone], text pages welcome

## 2021-06-05 NOTE — Progress Notes (Signed)
Pt called out because she "woke up vomiting".  Has not complained of any nausea throughout the day or since episode of emesis. Tube feeding stopped.  Ensure was pushed through g-tube about an hour ago.  Pt states pain is unchanged from what she has been reporting throughout the day.  Family medicine notified. Orders received to hold tube feeds for 1 hr and then resume at 38m/hr.

## 2021-06-05 NOTE — Progress Notes (Signed)
OT Cancellation Note  Patient Details Name: Dana Coleman MRN: [id number] DOB: 05-31-1941   Cancelled Treatment:    Reason Eval/Treat Not Completed: Fatigue/lethargy limiting ability to participate. Reports she is too tired and "can't get any rest." Refused therapeutic exercise in bed as well. Also getting ready to go down for CT.   [name] [name] 06/05/2021, 10:05 AM

## 2021-06-06 DIAGNOSIS — I2699 Other pulmonary embolism without acute cor pulmonale: Secondary | ICD-10-CM | POA: Diagnosis not present

## 2021-06-06 DIAGNOSIS — E43 Unspecified severe protein-calorie malnutrition: Secondary | ICD-10-CM | POA: Diagnosis not present

## 2021-06-06 LAB — GLUCOSE, CAPILLARY
Glucose-Capillary: 182 mg/dL — ABNORMAL HIGH (ref 70–99)
Glucose-Capillary: 101 mg/dL — ABNORMAL HIGH (ref 70–99)
Glucose-Capillary: 116 mg/dL — ABNORMAL HIGH (ref 70–99)
Glucose-Capillary: 178 mg/dL — ABNORMAL HIGH (ref 70–99)
Glucose-Capillary: 159 mg/dL — ABNORMAL HIGH (ref 70–99)
Glucose-Capillary: 191 mg/dL — ABNORMAL HIGH (ref 70–99)
Glucose-Capillary: 206 mg/dL — ABNORMAL HIGH (ref 70–99)

## 2021-06-06 LAB — CBC WITH DIFFERENTIAL/PLATELET
Abs Immature Granulocytes: 0.04 10*3/uL (ref 0.00–0.07)
Basophils Absolute: 0 10*3/uL (ref 0.0–0.1)
Basophils Relative: 1 %
Eosinophils Absolute: 0.1 10*3/uL (ref 0.0–0.5)
Eosinophils Relative: 2 %
HCT: 31 % — ABNORMAL LOW (ref 36.0–46.0)
Hemoglobin: 9.9 g/dL — ABNORMAL LOW (ref 12.0–15.0)
Immature Granulocytes: 1 %
Lymphocytes Relative: 17 %
Lymphs Abs: 1.3 10*3/uL (ref 0.7–4.0)
MCH: 30.1 pg (ref 26.0–34.0)
MCHC: 31.9 g/dL (ref 30.0–36.0)
MCV: 94.2 fL (ref 80.0–100.0)
Monocytes Absolute: 1 10*3/uL (ref 0.1–1.0)
Monocytes Relative: 13 %
Neutro Abs: 5.1 10*3/uL (ref 1.7–7.7)
Neutrophils Relative %: 66 %
Platelets: 211 10*3/uL (ref 150–400)
RBC: 3.29 MIL/uL — ABNORMAL LOW (ref 3.87–5.11)
RDW: 18.7 % — ABNORMAL HIGH (ref 11.5–15.5)
WBC: 7.7 10*3/uL (ref 4.0–10.5)
nRBC: 0 % (ref 0.0–0.2)

## 2021-06-06 LAB — COMPREHENSIVE METABOLIC PANEL
ALT: 47 U/L — ABNORMAL HIGH (ref 0–44)
AST: 56 U/L — ABNORMAL HIGH (ref 15–41)
Albumin: 1.7 g/dL — ABNORMAL LOW (ref 3.5–5.0)
Alkaline Phosphatase: 70 U/L (ref 38–126)
Anion gap: 5 (ref 5–15)
BUN: 21 mg/dL (ref 8–23)
CO2: 24 mmol/L (ref 22–32)
Calcium: 8.5 mg/dL — ABNORMAL LOW (ref 8.9–10.3)
Chloride: 107 mmol/L (ref 98–111)
Creatinine, Ser: 0.55 mg/dL (ref 0.44–1.00)
GFR, Estimated: >60 mL/min (ref >60)
Glucose, Bld: 119 mg/dL — ABNORMAL HIGH (ref 70–99)
Potassium: 4.8 mmol/L (ref 3.5–5.1)
Sodium: 136 mmol/L (ref 135–145)
Total Bilirubin: 0.4 mg/dL (ref 0.3–1.2)
Total Protein: 6 g/dL — ABNORMAL LOW (ref 6.5–8.1)

## 2021-06-06 MED ORDER — MORPHINE SULFATE (PF) 2 MG/ML IV SOLN
1.0000 mg | INTRAVENOUS | Status: DC | PRN
  Administered 2021-06-06 – 2021-06-07 (×2): 1 mg via INTRAVENOUS
  Filled 2021-06-06 (×2): qty 1

## 2021-06-06 NOTE — Progress Notes (Signed)
Sat down with patients family for an hour. Answered all their disease process, treatment, and procedure questions. After thorough explanation I asked if there was anything I needed to know.Marland Kitchen  Pt Wey has lost her oldest daughter, her youngest daughter, and her sister all in the past 6 months.  I decided to make a progress note about this to make the treatment team aware of family loss that may be taken into account considering her lack of appetite, flat affect, and nausea.  Pt. Has otherwise done well with the tube feeding, supplemental nutrition, and medications today.  No vomiting, a little nausea this morning, pain around Gtube incision site that is within defined limits as expected of patient.  Taken off of O2 for a few hours now and has sustained O2 stats of 92-99.  Tube feeding is set at 28m/hr. After talking to the treatment team will set feeding rate at 531mhr later this evening.

## 2021-06-06 NOTE — Progress Notes (Signed)
FPTS Brief Progress Note  S Saw patient at bedside this evening. Patient was sat up in bed and looked uncomfortable. Requesting morphine again for pain. She was on 0.5L oxygen.   O: BP 124/80 (BP Location: Right Arm)   Pulse (!) 105   Temp 98.4 F (36.9 C) (Oral)   Resp 18   Ht '5\' 1"'$  (1.549 m)   Wt 71.2 kg   SpO2 98%   BMI 29.66 kg/m    General: Alert, mild distress, nasal cannula  Abdomen: G tube site clean and dry   Extremities: No peripheral edema.  Neuro: Cranial nerves grossly intact   A/P: Plan per day team  -Continue tube feeds  -Morphine '1mg'$  Q4PRN x 3 doses overnight  -Wean off oxygen as able  - Orders reviewed. Labs for AM not ordered, which was adjusted as needed.   Lattie Haw, MD 06/06/2021, 8:28 PM PGY-3, [facility] Night Resident  Please page [phone] with questions.

## 2021-06-06 NOTE — Progress Notes (Signed)
Nutrition Follow-up  DOCUMENTATION CODES:   Severe malnutrition in context of acute illness/injury  INTERVENTION:    Tube feeding:  -Osmolite 1.5 @ 50 ml/hr (1200 ml) via PEG -ProSource TF 45 ml BID  Provides: 1880 kcals, 97 grams protein, 914 ml free water.   -Obtain daily weights   NUTRITION DIAGNOSIS:   Severe Malnutrition related to acute illness as evidenced by moderate fat depletion, mild fat depletion, moderate muscle depletion.  Ongoing  GOAL:   Patient will meet greater than or equal to 90% of their needs  Addressed via TF  MONITOR:   Diet advancement, I & O's, Labs, Weight trends, Skin (TPN)  REASON FOR ASSESSMENT:   Consult Calorie Count  ASSESSMENT:   80 yo female admitted post recent lap cholecystectomy with acute respiratory failure and renal failure with severe lactic acidosis from sepsis and found to have esophageal perforation, empyema. PMH includes DM, HTN, HLD, CAD  7/21 Lap chole 7/23 Admitted, Intubated, Pigtail by CCS in ED with bilious output 7/24 CRRT initiated, R Thoracotomy, repair of esophageal perforation, drainage of empyema and decortication of right lung 7/26 Extubated, TPN initiated 7/31 Stop CRRT 8/01 Barium swallow, no leak 8/02 CL diet started 8/12 s/p MBSS- advanced to dysphagia 1 diet with thin liquids 8/22 PEG placed, TF started  8/24 TPN stopped   Intake remains minimal. Last meal completion charted as 25%. TPN discontinued.   Complained of abdominal pain when Osmolite 1.5 rate was increased to 40 ml/hr from 30 ml/hr yesterday. Underwent CTA which showed small amount of intraperitoneal free air/free fluid, potentially related to gastrostomy tube placement 2 days ago. Tube remains okay to use per radiology.   Supplement orders adjusted to be provided per PEG tube by medical team. Ensure Enlive was administered as bolus via PEG last night. Patient had vomiting episode following administration. Tube feeding was initially  held but was started back at 40 ml/hr this am. Patient denies nausea or abdominal pain while on the rate. Plan increase to goal later today.   Of note Ensure Enlive is NOT to be given per PEG as patient has difficulty with larger volumes and is receiving Osmolite 1.5 as primary source of nutrition.   Admission weight: 58 kg  Current weight: 71.2 kg (taken 8/19)  UOP: 700 ml x 24 hrs   Medications: reviewed  Labs: LFTs elevated CBG 97-182  Diet Order:   Diet Order             DIET DYS 2 Room service appropriate? Yes with Assist; Fluid consistency: Thin  Diet effective now                   EDUCATION NEEDS:   Not appropriate for education at this time  Skin:  Skin Assessment: Skin Integrity Issues: Skin Integrity Issues:: DTI Stage II: buttocks x2 Stage I: sacrum Incisions: abdomen/chest Skin tear: back   Last BM:  8/23  Height:   Ht Readings from Last 1 Encounters:  05/04/21 '5\' 1"'$  (1.549 m)    Weight:   Wt Readings from Last 1 Encounters:  05/31/21 71.2 kg   BMI:  Body mass index is 29.66 kg/m.  Estimated Nutritional Needs:   Kcal:  1850-2050 kcal  Protein:  95-115 grams  Fluid:  >/= 1.8 L/day  Mariana Single MS, RD, LDN, CNSC Clinical Nutrition Pager listed in [place]

## 2021-06-06 NOTE — Progress Notes (Signed)
Occupational Therapy Treatment Patient Details Name: Dana Coleman MRN: [id number] DOB: August 01, 1941 Today's Date: 06/06/2021    History of present illness Pt is a 80 yo female admitted on 05/04/21 for AMS. HX of cholecystectomy on 7/21.  Intubated upon admission, 7/24 Started CRRT, Esophageal perforation with septic shock post flap repair, drainage of empyema and decortication of R lung with chest tube placed.  7/25 EKG changes and probable nstemi. Extubated 7/26. CT removed 8/4. Pt with new PE with R heart strain and increased O2 HFNC requirements 8/8. Plan for possible G-tube placement 8/19. PMH includes: R breast CA, DM, CAD, HTN.   OT comments  Pt with gradual progress towards OT goals. Pt remains limited by pain despite premedication, as well as anxiety related to movement. Pt overall Mod A x 2 for bed mobility and able to progress to Min A x 2 for pivot to recliner. Due to abdominal pain, balance deficits and inability to reach feet, pt continues to require extensive assist for LB ADLs. Continue to recommend SNF rehab at [place].  SpO2 96% and above on RA, HR WFL   Follow Up Recommendations  SNF;Supervision/Assistance - 24 hour    Equipment Recommendations  3 in 1 bedside commode    Recommendations for Other Services      Precautions / Restrictions Precautions Precautions: Fall Precaution Comments: watch HR, G tube Restrictions Weight Bearing Restrictions: No       Mobility Bed Mobility Overal bed mobility: Needs Assistance Bed Mobility: Rolling;Sidelying to Sit Rolling: Mod assist;+2 for safety/equipment Sidelying to sit: Mod assist;+2 for physical assistance;+2 for safety/equipment       General bed mobility comments: Mod A for rolling to begin donning brief, as well as log rolling initiation.Assist to lift trunk and bring LEs off of bed to complete log rolling    Transfers Overall transfer level: Needs assistance Equipment used: Rolling walker (2 wheeled) Transfers:  Sit to/from [facility] Sit to Stand: Min assist;+2 physical assistance;+2 safety/equipment Stand pivot transfers: Min assist;+2 physical assistance;+2 safety/equipment       General transfer comment: Min A x 2 for power up into standing and take steps to recliner. cues for sequencing, balance and line safety. Pt was noted to sit down quickly once at recliner (on O2 cord) and requires increased cues for transitional movements/hand placement    Balance Overall balance assessment: Needs assistance Sitting-balance support: Feet supported;Bilateral upper extremity supported Sitting balance-Leahy Scale: Poor Sitting balance - Comments: pt with pain and relying on UEs to support to minimize trunk dependence   Standing balance support: Bilateral upper extremity supported Standing balance-Leahy Scale: Poor Standing balance comment: dependent on UE support                           ADL either performed or assessed with clinical judgement   ADL Overall ADL's : Needs assistance/impaired                     Lower Body Dressing: Total assistance;Sit to/from stand;Bed level Lower Body Dressing Details (indicate cue type and reason): to don brief as pt with recent bowel incontinence               General ADL Comments: Session focused on progressing endurance, transfer abilities in translation to ADLs     Vision   Vision Assessment?: No apparent visual deficits   Perception     Praxis      Cognition  Arousal/Alertness: Awake/alert Behavior During Therapy: Flat affect;Anxious Overall Cognitive Status: No family/caregiver present to determine baseline cognitive functioning Area of Impairment: Problem solving;Awareness;Following commands;Safety/judgement                       Following Commands: Follows one step commands with increased time Safety/Judgement: Decreased awareness of safety;Decreased awareness of deficits Awareness:  Emergent Problem Solving: Slow processing;Requires verbal cues;Decreased initiation General Comments: flat affect, anxious with pain/movement. Increased time to follow commands, repetition of cues beneficial for carryover        Exercises     Shoulder Instructions       General Comments VSS on RA (was on 0.25 L on entry, so OT turned off on wall) though pt anxious and wanted to keep canula in nose. HR WFL    Pertinent Vitals/ Pain       Pain Assessment: 0-10 Pain Score: 5  Pain Location: L abdomen at G-tube site; initially 5/10 but per faces, increased with bed mobility Pain Descriptors / Indicators: Grimacing;Guarding;Sore Pain Intervention(s): Monitored during session;Premedicated before session;Limited activity within patient's tolerance;Repositioned  Home Living                                          Prior Functioning/Environment              Frequency  Min 2X/week        Progress Toward Goals  OT Goals(current goals can now be found in the care plan section)  Progress towards OT goals: Progressing toward goals  Acute Rehab OT Goals Patient Stated Goal: stop the pain OT Goal Formulation: With patient Time For Goal Achievement: 06/05/21 Potential to Achieve Goals: Fair ADL Goals Pt Will Perform Grooming: with min guard assist;standing Pt Will Perform Lower Body Dressing: with mod assist;sitting/lateral leans;with adaptive equipment;sit to/from stand Pt Will Transfer to Toilet: with supervision;stand pivot transfer;bedside commode;ambulating Pt Will Perform Toileting - Clothing Manipulation and hygiene: with min assist;sit to/from stand Pt/caregiver will Perform Home Exercise Program: Increased strength;Both right and left upper extremity;With theraband;With written HEP provided  Plan Discharge plan remains appropriate    Co-evaluation    PT/OT/SLP Co-Evaluation/Treatment: Yes Reason for Co-Treatment: For patient/therapist safety;To  address functional/ADL transfers;Complexity of the patient's impairments (multi-system involvement)   OT goals addressed during session: ADL's and self-care      AM-PAC OT "6 Clicks" Daily Activity     Outcome Measure   Help from another person eating meals?: A Little Help from another person taking care of personal grooming?: A Little Help from another person toileting, which includes using toliet, bedpan, or urinal?: Total Help from another person bathing (including washing, rinsing, drying)?: A Lot Help from another person to put on and taking off regular upper body clothing?: A Little Help from another person to put on and taking off regular lower body clothing?: Total 6 Click Score: 13    End of Session Equipment Utilized During Treatment: Rolling walker;Gait belt  OT Visit Diagnosis: Other abnormalities of gait and mobility (R26.89);Muscle weakness (generalized) (M62.81);Pain;Other symptoms and signs involving cognitive function Pain - part of body:  (abdomen)   Activity Tolerance Patient limited by pain;Patient tolerated treatment well   Patient Left in chair;with call bell/phone within reach;with chair alarm set   Nurse Communication Mobility status        Time: 1111-1141 OT Time Calculation (min): 30 min  Charges: OT General Charges $OT Visit: 1 Visit OT Treatments $Therapeutic Activity: 8-22 mins  Malachy Chamber, OTR/L Acute Rehab Services Office: [phone]    Layla Maw 06/06/2021, 12:54 PM

## 2021-06-06 NOTE — Progress Notes (Signed)
Physical Therapy Treatment Patient Details Name: Dana Coleman MRN: [id number] DOB: 1941/07/17 Today's Date: 06/06/2021    History of Present Illness Pt is a 80 yo female admitted on 05/04/21 for AMS. HX of cholecystectomy on 7/21.  Intubated upon admission, 7/24 Started CRRT, Esophageal perforation with septic shock post flap repair, drainage of empyema and decortication of R lung with chest tube placed.  7/25 EKG changes and probable nstemi. Extubated 7/26. CT removed 8/4. Pt with new PE with R heart strain and increased O2 HFNC requirements 8/8. G-tube placed 8/22. PMH includes: R breast CA, DM, CAD, HTN.    PT Comments    Pt received in supine, anxious regarding mobility but agreeable to therapy session with encouragement. Pt needs increased time/cues to initiate and perform all mobility tasks due to anxiety related to fear of pain (per pt report), but reports moderate pain throughout session, never >6/10. Pt needing mostly modA +2 for safety/lift assist with bed mobility and transfers and minA +2 for standing tasks (pivot to chair) and RW management. Pt impulsive to sit once near chair and denies lightheadedness but endorses "feeling hot" and with increased respiratory rate throughout. Pt continues to benefit from PT services to progress toward functional mobility goals.   Follow Up Recommendations  SNF;Supervision/Assistance - 24 hour     Equipment Recommendations  Rolling walker with 5" wheels;Wheelchair (measurements PT);Wheelchair cushion (measurements PT)    Recommendations for Other Services       Precautions / Restrictions Precautions Precautions: Fall Precaution Comments: watch HR/RR, G tube Restrictions Weight Bearing Restrictions: No    Mobility  Bed Mobility Overal bed mobility: Needs Assistance Bed Mobility: Rolling;Sidelying to Sit Rolling: Mod assist;+2 for safety/equipment Sidelying to sit: Mod assist;+2 for physical assistance;+2 for safety/equipment        General bed mobility comments: Mod A for rolling to begin donning brief, as well as log rolling initiation.Assist to lift trunk and bring LEs off of bed to complete log rolling.    Transfers Overall transfer level: Needs assistance Equipment used: Rolling walker (2 wheeled) Transfers: Sit to/from [facility] Sit to Stand: Min assist;+2 physical assistance;+2 safety/equipment Stand pivot transfers: Min assist;+2 physical assistance;+2 safety/equipment       General transfer comment: Min A x 2 for power up into standing and take steps to recliner. cues for sequencing, balance and line safety. Pt was noted to sit down quickly once at recliner (on O2 cord) and requires increased cues for transitional movements/hand placement; able to stand from EOB and recliner but needs safety cues for sequencing and max encouragement.  Ambulation/Gait Ambulation/Gait assistance: Min assist;+2 safety/equipment Gait Distance (Feet): 4 Feet Assistive device: Rolling walker (2 wheeled) Gait Pattern/deviations: Wide base of support;Trunk flexed;Decreased stride length;Decreased step length - right;Decreased step length - left Gait velocity: decreased; <0.3 m/s, limited distance   General Gait Details: pt hesitant to attempt and sits impulsively once over to chair; increased respiratory rate during/post transfer and pt c/o anxiety prior to attempting.       Balance Overall balance assessment: Needs assistance Sitting-balance support: Feet supported;Bilateral upper extremity supported Sitting balance-Leahy Scale: Poor Sitting balance - Comments: pt with pain and relying on UEs to support to minimize trunk dependence   Standing balance support: Bilateral upper extremity supported Standing balance-Leahy Scale: Poor Standing balance comment: dependent on UE support and external assist                  Cognition Arousal/Alertness: Awake/alert Behavior During Therapy:  Flat  affect;Anxious Overall Cognitive Status: No family/caregiver present to determine baseline cognitive functioning Area of Impairment: Problem solving;Awareness;Following commands;Safety/judgement               Memory: Decreased short-term memory;Decreased recall of precautions Following Commands: Follows one step commands with increased time Safety/Judgement: Decreased awareness of safety;Decreased awareness of deficits Awareness: Emergent Problem Solving: Slow processing;Requires verbal cues;Decreased initiation General Comments: flat affect, anxious with pain/movement. Increased time to follow commands, repetition of cues beneficial for carryover; Pt internally distracted due to pain/anxiety and needs max encouragement to attempt OOB/transfers and increased time.      Exercises General Exercises - Lower Extremity Ankle Circles/Pumps: AROM;Both;Supine;10 reps Quad Sets: AROM;Both;5 reps;Supine    General Comments General comments (skin integrity, edema, etc.): RR to 44 rpm, pt given cues for pursed-lip breathing and decreased to mid-20's rpm with seated rest breaks; HR to 106 bpm and SpO2 WNL on RA      Pertinent Vitals/Pain Pain Assessment: 0-10 Pain Score: 5  Pain Location: L abdomen at G-tube site; initially 5/10 but per faces, increased with bed mobility Pain Descriptors / Indicators: Grimacing;Guarding;Sore Pain Intervention(s): Limited activity within patient's tolerance;Monitored during session;Premedicated before session;Repositioned     PT Goals (current goals can now be found in the care plan section) Acute Rehab PT Goals Patient Stated Goal: stop the pain PT Goal Formulation: With patient Time For Goal Achievement: 06/12/21 Potential to Achieve Goals: Fair Progress towards PT goals: Progressing toward goals    Frequency    Min 3X/week      PT Plan Current plan remains appropriate    Co-evaluation PT/OT/SLP Co-Evaluation/Treatment: Yes Reason for  Co-Treatment: Complexity of the patient's impairments (multi-system involvement);Necessary to address cognition/behavior during functional activity;For patient/therapist safety;To address functional/ADL transfers PT goals addressed during session: Mobility/safety with mobility;Balance;Proper use of DME;Strengthening/ROM OT goals addressed during session: ADL's and self-care      AM-PAC PT "6 Clicks" Mobility   Outcome Measure  Help needed turning from your back to your side while in a flat bed without using bedrails?: A Lot Help needed moving from lying on your back to sitting on the side of a flat bed without using bedrails?: A Lot Help needed moving to and from a bed to a chair (including a wheelchair)?: A Lot Help needed standing up from a chair using your arms (e.g., wheelchair or bedside chair)?: A Lot Help needed to walk in hospital room?: A Lot Help needed climbing 3-5 steps with a railing? : Total 6 Click Score: 11    End of Session Equipment Utilized During Treatment: Gait belt;Other (comment) (O2 on 0.25L/min but able to wean to RA during session and SpO2 WFL) Activity Tolerance: Patient limited by fatigue;Patient limited by pain Patient left: in chair;with call bell/phone within reach;with chair alarm set Nurse Communication: Mobility status;Precautions;Other (comment) (pt does not want to sit up >1 hour, discussed technique for log rolling and safety with student RN and primary RN) PT Visit Diagnosis: Other abnormalities of gait and mobility (R26.89);Muscle weakness (generalized) (M62.81) Pain - part of body:  (abdomen/generalized)     Time: [id number] PT Time Calculation (min) (ACUTE ONLY): 30 min  Charges:  $Therapeutic Activity: 8-22 mins                     Milfred Krammes P., PTA Acute Rehabilitation Services Pager: [phone] Office: [place] 06/06/2021, 2:29 PM

## 2021-06-07 ENCOUNTER — Other Ambulatory Visit: Payer: Self-pay | Admitting: Family Medicine

## 2021-06-07 DIAGNOSIS — L8990 Pressure ulcer of unspecified site, unspecified stage: Secondary | ICD-10-CM | POA: Diagnosis not present

## 2021-06-07 DIAGNOSIS — E785 Hyperlipidemia, unspecified: Secondary | ICD-10-CM

## 2021-06-07 DIAGNOSIS — E43 Unspecified severe protein-calorie malnutrition: Secondary | ICD-10-CM | POA: Diagnosis not present

## 2021-06-07 DIAGNOSIS — I2699 Other pulmonary embolism without acute cor pulmonale: Secondary | ICD-10-CM | POA: Diagnosis not present

## 2021-06-07 LAB — COMPREHENSIVE METABOLIC PANEL
ALT: 69 U/L — ABNORMAL HIGH (ref 0–44)
AST: 75 U/L — ABNORMAL HIGH (ref 15–41)
Albumin: 1.8 g/dL — ABNORMAL LOW (ref 3.5–5.0)
Alkaline Phosphatase: 80 U/L (ref 38–126)
Anion gap: 6 (ref 5–15)
BUN: 18 mg/dL (ref 8–23)
CO2: 24 mmol/L (ref 22–32)
Calcium: 8.5 mg/dL — ABNORMAL LOW (ref 8.9–10.3)
Chloride: 105 mmol/L (ref 98–111)
Creatinine, Ser: 0.56 mg/dL (ref 0.44–1.00)
GFR, Estimated: >60 mL/min (ref >60)
Glucose, Bld: 270 mg/dL — ABNORMAL HIGH (ref 70–99)
Potassium: 5.4 mmol/L — ABNORMAL HIGH (ref 3.5–5.1)
Sodium: 135 mmol/L (ref 135–145)
Total Bilirubin: 0.5 mg/dL (ref 0.3–1.2)
Total Protein: 6.1 g/dL — ABNORMAL LOW (ref 6.5–8.1)

## 2021-06-07 LAB — POTASSIUM: Potassium: 5.5 mmol/L — ABNORMAL HIGH (ref 3.5–5.1)

## 2021-06-07 LAB — GLUCOSE, CAPILLARY
Glucose-Capillary: 185 mg/dL — ABNORMAL HIGH (ref 70–99)
Glucose-Capillary: 193 mg/dL — ABNORMAL HIGH (ref 70–99)
Glucose-Capillary: 219 mg/dL — ABNORMAL HIGH (ref 70–99)
Glucose-Capillary: 227 mg/dL — ABNORMAL HIGH (ref 70–99)

## 2021-06-07 MED ORDER — SODIUM ZIRCONIUM CYCLOSILICATE 10 G PO PACK
10.0000 g | PACK | Freq: Once | ORAL | Status: AC
  Administered 2021-06-08: 10 g via ORAL
  Filled 2021-06-07: qty 1

## 2021-06-07 MED ORDER — ACETAMINOPHEN 325 MG PO TABS
650.0000 mg | ORAL_TABLET | ORAL | Status: DC
  Administered 2021-06-07 – 2021-06-09 (×9): 650 mg
  Filled 2021-06-07 (×6): qty 2

## 2021-06-07 MED ORDER — OXYCODONE HCL 5 MG PO TABS
2.5000 mg | ORAL_TABLET | Freq: Four times a day (QID) | ORAL | Status: DC | PRN
  Administered 2021-06-07 – 2021-06-13 (×10): 2.5 mg via ORAL
  Filled 2021-06-07 (×10): qty 1

## 2021-06-07 MED ORDER — INSULIN GLARGINE-YFGN 100 UNIT/ML ~~LOC~~ SOLN
5.0000 [IU] | Freq: Every day | SUBCUTANEOUS | Status: DC
  Administered 2021-06-07 – 2021-06-17 (×11): 5 [IU] via SUBCUTANEOUS
  Filled 2021-06-07 (×11): qty 0.05

## 2021-06-07 NOTE — Progress Notes (Signed)
Physical Therapy Treatment Patient Details Name: Dana Coleman MRN: [id number] DOB: 04/10/1941 Today's Date: 06/07/2021    History of Present Illness Pt is a 80 yo female admitted on 05/04/21 for AMS. HX of cholecystectomy on 7/21.  Intubated upon admission, 7/24 Started CRRT, Esophageal perforation with septic shock post flap repair, drainage of empyema and decortication of R lung with chest tube placed.  7/25 EKG changes and probable nstemi. Extubated 7/26. CT removed 8/4. Pt with new PE with R heart strain and increased O2 HFNC requirements 8/8. G-tube placed 8/22. PMH includes: R breast CA, DM, CAD, HTN.    PT Comments    Pt received in supine, agreeable to therapy session with max encouragement and with good participation and tolerance for transfer training and seated/standing strengthening exercises. Pt continues to demonstrate decreased activity tolerance and needs increased time and multimodal cues to initiate and perform all functional mobility tasks. Pt continues to benefit from PT services to progress toward functional mobility goals. Continue to recommend SNF.   Follow Up Recommendations  SNF;Supervision/Assistance - 24 hour     Equipment Recommendations  Rolling walker with 5" wheels;Wheelchair (measurements PT);Wheelchair cushion (measurements PT)    Recommendations for Other Services       Precautions / Restrictions Precautions Precautions: Fall Precaution Comments: watch HR/RR, G tube Restrictions Weight Bearing Restrictions: No    Mobility  Bed Mobility Overal bed mobility: Needs Assistance Bed Mobility: Rolling;Sidelying to Sit;Sit to Sidelying Rolling: +2 for safety/equipment;Min assist Sidelying to sit: Mod assist;+2 for physical assistance;+2 for safety/equipment     Sit to sidelying: Mod assist;+2 for physical assistance General bed mobility comments: verbal cues for log rolling, pt needs physical/tactile assist to reach cross-body and push with opposite  leg prior to rolling. Pt also needs assist to lift trunk and bring LEs off of bed to complete log rolling. Max cues for reverse log roll as well.    Transfers Overall transfer level: Needs assistance Equipment used: Rolling walker (2 wheeled) Transfers: Sit to/from Stand Sit to Stand: Min assist;+2 physical assistance;+2 safety/equipment         General transfer comment: Min A x 2 for power up into standing and take steps toward HOB. cues for sequencing, balance and line safety. Pt refusing to sit up in chair because she sat up ~30 mins earlier in day with RN assist.  Ambulation/Gait Ambulation/Gait assistance: Min assist;+2 safety/equipment Gait Distance (Feet): 3 Feet Assistive device: Rolling walker (2 wheeled) Gait Pattern/deviations: Trunk flexed;Decreased stride length;Decreased step length - right;Decreased step length - left Gait velocity: decreased; <0.3 m/s, limited distance   General Gait Details: sidesteps toward [place] only; shuffled steps   Stairs             Wheelchair Mobility    Modified Rankin (Stroke Patients Only)       Balance Overall balance assessment: Needs assistance Sitting-balance support: Feet supported;Bilateral upper extremity supported Sitting balance-Leahy Scale: Poor Sitting balance - Comments: pt with pain and relying on UEs to support to minimize trunk dependence   Standing balance support: Bilateral upper extremity supported Standing balance-Leahy Scale: Poor Standing balance comment: dependent on UE support and external assist                            Cognition Arousal/Alertness: Awake/alert Behavior During Therapy: Anxious Overall Cognitive Status: No family/caregiver present to determine baseline cognitive functioning Area of Impairment: Problem solving;Awareness;Following commands;Safety/judgement  Memory: Decreased short-term memory;Decreased recall of precautions Following  Commands: Follows one step commands with increased time Safety/Judgement: Decreased awareness of safety;Decreased awareness of deficits Awareness: Emergent Problem Solving: Slow processing;Requires verbal cues;Decreased initiation General Comments: Anxious with pain/movement. Increased time to follow commands, repetition of cues beneficial for carryover; Pt internally distracted due to pain/anxiety and needs max encouragement to attempt OOB/transfers and increased time. Pt with sarcastic sense of humor. Also, per family she recently lost an older and younger daughter and her sister in the past 6 mos.      Exercises General Exercises - Lower Extremity Ankle Circles/Pumps: AROM;Both;10 reps;Seated Long Arc Quad: AROM;Both;10 reps;Seated Hip Flexion/Marching: AAROM;Both;10 reps;Seated Other Exercises Other Exercises: STS from EOB and standing 2 mins at RW for BLE strengthening    General Comments General comments (skin integrity, edema, etc.): RR 25-34 rpm; Spo2 98% on RA, pt reports 7/10 pain post-session and RN notified      Pertinent Vitals/Pain Pain Assessment: 0-10 Pain Score: 7  Pain Location: L abdomen at G-tube site Pain Descriptors / Indicators: Grimacing;Guarding;Sore Pain Intervention(s): Limited activity within patient's tolerance;Monitored during session;Repositioned;Patient requesting pain meds-RN notified    Home Living                      Prior Function            PT Goals (current goals can now be found in the care plan section) Acute Rehab PT Goals Patient Stated Goal: stop the pain PT Goal Formulation: With patient Time For Goal Achievement: 06/12/21 Potential to Achieve Goals: Fair Progress towards PT goals: Progressing toward goals    Frequency    Min 3X/week      PT Plan Current plan remains appropriate    Co-evaluation              AM-PAC PT "6 Clicks" Mobility   Outcome Measure  Help needed turning from your back to your side  while in a flat bed without using bedrails?: A Lot Help needed moving from lying on your back to sitting on the side of a flat bed without using bedrails?: A Lot Help needed moving to and from a bed to a chair (including a wheelchair)?: A Lot Help needed standing up from a chair using your arms (e.g., wheelchair or bedside chair)?: A Lot Help needed to walk in hospital room?: A Lot Help needed climbing 3-5 steps with a railing? : Total 6 Click Score: 11    End of Session   Activity Tolerance: Patient limited by fatigue;Patient limited by pain Patient left: with call bell/phone within reach;in bed;with bed alarm set (semi-sidelying to R side) Nurse Communication: Mobility status;Patient requests pain meds PT Visit Diagnosis: Other abnormalities of gait and mobility (R26.89);Muscle weakness (generalized) (M62.81) Pain - part of body:  (low back and L hip pain)     Time: [id number] PT Time Calculation (min) (ACUTE ONLY): 17 min  Charges:  $Therapeutic Exercise: 8-22 mins                     Khristine Verno P., PTA Acute Rehabilitation Services Pager: [phone] Office: [place] 06/07/2021, 3:15 PM

## 2021-06-07 NOTE — Progress Notes (Signed)
  Speech Language Pathology Treatment: Dysphagia  Patient Details Name: Dana Coleman MRN: [id number] DOB: 1941/08/26 Today's Date: 06/07/2021 Time: 4098-1191 SLP Time Calculation (min) (ACUTE ONLY): 8 min  Assessment / Plan / Recommendation Clinical Impression  Pt continues to demonstrate minimal interest in oral intake. Meal at bedside, pt refuses. She does state that the texture of food as it is prepared is most appropriate for her. Recommend ongoing Dys 2/thin liquids at d/c to SNF. Pt to continue progression at next level of care.   HPI HPI: Pt is a 80 yo female admitted on 05/04/21 for AMS. HX of cholecystectomy on 7/21.  Intubated upon admission, 7/24 Started CRRT, Esophageal perforation with septic shock post flap repair, drainage of empyema and decortication of R lung with chest tube placed.  7/25 EKG changes and probable nstemi. Extubated 7/26. Esophagram 8/1 cleared to start PO diet. CT removed 8/4. Pt with concern for aspiration 8/8 and made NPO. Pt also with new PE with R heart strain and increased O2 HFNC requirements 8/8. PMH includes: R breast CA, DM, CAD, HTN.      SLP Plan  All goals met       Recommendations  Diet recommendations: Dysphagia 2 (fine chop);Thin liquid Liquids provided via: Cup;Straw Medication Administration: Crushed with puree Supervision: Staff to assist with self feeding Compensations: Slow rate;Small sips/bites Postural Changes and/or Swallow Maneuvers: Seated upright 90 degrees;Upright 30-60 min after meal                Oral Care Recommendations: Oral care BID Follow up Recommendations: 24 hour supervision/assistance SLP Visit Diagnosis: Dysphagia, oral phase (R13.11) Plan: All goals met       GO                Chihiro Frey, Katherene Ponto 06/07/2021, 2:11 PM

## 2021-06-07 NOTE — Progress Notes (Addendum)
Family Medicine Teaching Service Daily Progress Note Intern Pager: [phone]  Patient name: Dana Coleman Medical record number: [id number] Date of birth: February 01, 1941 Age: 80 y.o. Gender: female  Primary Care Provider: Gifford Shave, MD Consultants: GI, CVTS, pulmonology, cardiology, INR Code Status: Partial code  Pt Overview and Major Events to Date:  7/23 admitted to ICU, intubated, right pigtail catheter placed 7/24- CRRT, emergent right thoracotomy, repair of esophageal perforation with intercostal pedicled muscle flap and drainage of empyema and decortication of right lung 7/25- NSTEMI, no intervention 7/26- extubated, TPN started 8/1- esophagram without leak, PO started, heparin gtt d/c because of increased bleeding from chest tubes. Transfused 1u pRBC 8/3: anterior chest tube removed 8/4: remaining chest tube removed 8/8: increased O2 requirement, Afib RVR -> PE with right heart strain 8/9: further increased O2 requirement, Afib 8/13: started Entresto, decreasing supplemental oxygen requirement 8/22: G-tube placed  Assessment and Plan: Dana Coleman is a 80 year old female admitted following esophageal perforation, s/p repair, NSTEMI on 7/25, and new onset A. fib and PE on 05/20/2021.  Past medical history significant for hypertension, cholecystectomy, type 2 diabetes mellitus, breast cancer  Protein calorie malnutrition/deconditioning, s/p G-tube placement POD day #4.  Pain at G-tube site is improving per patient, no signs of infection or obstruction.  Overnight, she had an episode of pain that was responsive to morphine 2 mg x 1. Will not give any more morphine at night for pain, we will change regimen to Tylenol scheduled and oxycodone as needed for severe pain q6 hours.  The rest of the night was uneventful.  No episodes of emesis last night or this morning.  Patient was denied CR admission, will follow up today with TOC on SNF placement as patient is approaching medical  stability for SNF. -Continue dysphagia 2 diet, per SLP -Continue Osmolite at 50 mL/hour (goal feeds) -Will schedule Tylenol q4 hours -Use Oxycodone 2.5 for severe pain PRN q6 hours -Lidocaine patch PRN for pain near PEG tube -Nutrition following, appreciate recommendations -I's and O's  Left upper quadrant/left lower quadrant abdominal pain Improved today.  1 episode of pain last night.  Abdominal exam is unremarkable except for mild tenderness to palpation around G tube site, no acute abdomen.  No signs of infection.  No signs of peritonitis.  No guarding, rebound, or changes in pain with deep inspiration.  This is still likely secondary to postop discomfort from small amount of intraperitoneal free air and fluid related to G-tube placement, as it is improving, and CT abdomen on 8/24 was negative for concerning intra-abdominal pathology. -Continue to monitor G-tube site for infection -Serial abdominal exams  Pulmonary embolism Patient on room air.  Maintaining oxygen saturations greater than 92%.  No shortness of breath or dyspnea.  Normal respiratory rate. -Continue Eliquis 5 mg p.o. twice daily -Continue to monitor respiratory status  Positive acid-fast bacilli from pleural few fluid We will follow-up on culture and discuss with pulmonology.  This is possible colonization.  Respiratory status remained stable. -Follow-up on culture  T2DM CBG ranging from 116-206 max.  Most recent 185. As glucose levels are rising, we will start Lantus 5u daily. -POC CBGs -Lantus 5u daily    FEN/GI: Dysphagia 2 diet, per SLP PPx: Eliquis Dispo:SNF pending placement. Barriers include G-tube toleration, bed availability.   Subjective:  Dana Coleman appears well today.  She has no complaints of any pain or discomfort anywhere.  She only has pain at G-tube insertion site with movement, such as getting up out of  the bed.  No nausea or vomiting this morning.  She says she is well rested from last  night.  Objective: Temp:  [98.2 F (36.8 C)-99.7 F (37.6 C)] 98.2 F (36.8 C) (08/26 0435) Pulse Rate:  [84-105] 84 (08/26 0435) Resp:  [18-24] 20 (08/26 0435) BP: (106-126)/(62-82) 114/73 (08/26 0435) SpO2:  [94 %-98 %] 97 % (08/26 0435) Weight:  [72.6 kg] 72.6 kg (08/26 0000) Physical Exam: General: Chronically ill and weak appearing, resting comfortably in the bed with breakfast tray in front of her Cardiovascular: Regular rate and rhythm Respiratory: Clear to auscultation bilaterally, normal work of breathing Abdomen: G-tube site covered with dry dressing.  No redness or discharge from site.  Osmolite running, with no obstruction.  No significant tenderness to palpation with no rebound or guarding in abdomen.  Soft. Extremities: Warm, dry, well perfused.  Mild trace edema in right lower extremity.  Laboratory: Recent Labs  Lab 06/03/21 0500 06/05/21 1124 06/06/21 0614  WBC 9.3 8.5 7.7  HGB 9.7* 9.6* 9.9*  HCT 29.9* 29.4* 31.0*  PLT 241 206 211   Recent Labs  Lab 06/03/21 0500 06/05/21 1124 06/06/21 0614  NA 137 135 136  K 4.1 4.8 4.8  CL 106 105 107  CO2 '25 24 24  '$ BUN 27* 30* 21  CREATININE 0.59 0.62 0.55  CALCIUM 8.4* 8.4* 8.5*  PROT  --  6.1* 6.0*  BILITOT  --  0.2* 0.4  ALKPHOS  --  66 70  ALT  --  40 47*  AST  --  43* 56*  GLUCOSE 68* 194* 119*    Imaging/Diagnostic Tests: No results found.   Orvis Brill, DO 06/07/2021, 7:46 AM PGY-1, [place] Intern pager: [phone], text pages welcome

## 2021-06-08 DIAGNOSIS — R109 Unspecified abdominal pain: Secondary | ICD-10-CM | POA: Diagnosis not present

## 2021-06-08 DIAGNOSIS — E875 Hyperkalemia: Secondary | ICD-10-CM | POA: Diagnosis not present

## 2021-06-08 LAB — HEPATIC FUNCTION PANEL
ALT: 72 U/L — ABNORMAL HIGH (ref 0–44)
AST: 69 U/L — ABNORMAL HIGH (ref 15–41)
Albumin: 1.7 g/dL — ABNORMAL LOW (ref 3.5–5.0)
Alkaline Phosphatase: 82 U/L (ref 38–126)
Bilirubin, Direct: <0.1 mg/dL (ref 0.0–0.2)
Total Bilirubin: 0.5 mg/dL (ref 0.3–1.2)
Total Protein: 5.8 g/dL — ABNORMAL LOW (ref 6.5–8.1)

## 2021-06-08 LAB — GLUCOSE, CAPILLARY
Glucose-Capillary: 167 mg/dL — ABNORMAL HIGH (ref 70–99)
Glucose-Capillary: 168 mg/dL — ABNORMAL HIGH (ref 70–99)
Glucose-Capillary: 201 mg/dL — ABNORMAL HIGH (ref 70–99)
Glucose-Capillary: 215 mg/dL — ABNORMAL HIGH (ref 70–99)

## 2021-06-08 LAB — BASIC METABOLIC PANEL
Anion gap: 5 (ref 5–15)
BUN: 17 mg/dL (ref 8–23)
CO2: 26 mmol/L (ref 22–32)
Calcium: 8.6 mg/dL — ABNORMAL LOW (ref 8.9–10.3)
Chloride: 105 mmol/L (ref 98–111)
Creatinine, Ser: 0.58 mg/dL (ref 0.44–1.00)
GFR, Estimated: >60 mL/min (ref >60)
Glucose, Bld: 232 mg/dL — ABNORMAL HIGH (ref 70–99)
Potassium: 5 mmol/L (ref 3.5–5.1)
Sodium: 136 mmol/L (ref 135–145)

## 2021-06-08 LAB — HIV ANTIBODY (ROUTINE TESTING W REFLEX): HIV Screen 4th Generation wRfx: NONREACTIVE

## 2021-06-08 MED ORDER — METOPROLOL SUCCINATE ER 50 MG PO TB24
50.0000 mg | ORAL_TABLET | Freq: Every day | ORAL | Status: DC
  Administered 2021-06-09 – 2021-06-13 (×5): 50 mg via ORAL
  Filled 2021-06-08 (×5): qty 1

## 2021-06-08 MED ORDER — INSULIN ASPART 100 UNIT/ML IJ SOLN
0.0000 [IU] | Freq: Three times a day (TID) | INTRAMUSCULAR | Status: DC
  Administered 2021-06-08 – 2021-06-09 (×4): 2 [IU] via SUBCUTANEOUS
  Administered 2021-06-09: 5 [IU] via SUBCUTANEOUS
  Administered 2021-06-09 – 2021-06-10 (×4): 2 [IU] via SUBCUTANEOUS
  Administered 2021-06-11: 3 [IU] via SUBCUTANEOUS

## 2021-06-08 MED ORDER — METOPROLOL TARTRATE 25 MG PO TABS
25.0000 mg | ORAL_TABLET | Freq: Two times a day (BID) | ORAL | Status: AC
  Administered 2021-06-08: 25 mg
  Filled 2021-06-08: qty 1

## 2021-06-08 NOTE — Progress Notes (Signed)
FPTS Brief Progress Note  S: Patient was sleeping in bed.  Woke the patient, patient denies any complaints at this time.  Pain at this time is relatively well controlled.   O: BP 116/73 (BP Location: Right Arm)   Pulse 90   Temp 98.7 F (37.1 C) (Oral)   Resp 20   Ht '5\' 1"'$  (1.549 m)   Wt 72.6 kg   SpO2 95%   BMI 30.24 kg/m   General: Resting comfortably in bed, no acute distress Cardio: Regular rate and rhythm with no murmurs Abdominal: Minimal tenderness, bowel sounds present  Skin: Warm and dry  A/P: Plan per day team - Orders reviewed. Labs for AM ordered, which was adjusted as needed.  -Patient had increased potassium 5.4> 5.5.  We DC'd her [name], gave [facility], will continue to monitor with morning BMP, will obtain EKG if potassium increases over 6 -Pain control: No morphine overnight. Current pain medication: Tylenol scheduled every 4 hours and Oxy 2.5 every 6 hours as needed -Also awaiting SNF  Erskine Emery, MD 06/08/2021, 12:32 AM PGY-1, [facility] Family Medicine Night Resident  Please page [phone] with questions.

## 2021-06-08 NOTE — Plan of Care (Signed)
  Problem: Education: Goal: Knowledge of General Education information will improve Description Including pain rating scale, medication(s)/side effects and non-pharmacologic comfort measures Outcome: Progressing   

## 2021-06-08 NOTE — Progress Notes (Addendum)
Family Medicine Teaching Service Daily Progress Note Intern Pager: [phone]  Patient name: Dana Coleman Medical record number: [id number] Date of birth: [birth year] Age: 80 y.o. Gender: female  Primary Care Provider: Gifford Shave, MD Consultants: GI, CVTS, pulmonology, cardiology, INR Code Status: Partial code  Pt Overview and Major Events to Date:  7/23 admitted to ICU, intubated, right pigtail catheter placed 7/24- CRRT, emergent right thoracotomy, repair of esophageal perforation with intercostal pedicled muscle flap and drainage of empyema and decortication of right lung 7/25- NSTEMI, no intervention 7/26- extubated, TPN started 8/1- esophagram without leak, PO started, heparin gtt d/c because of increased bleeding from chest tubes. Transfused 1u pRBC 8/3: anterior chest tube removed 8/4: remaining chest tube removed 8/8: increased O2 requirement, Afib RVR -> PE with right heart strain 8/9: further increased O2 requirement, Afib 8/13: started Entresto, decreasing supplemental oxygen requirement 8/22: G-tube placed   Assessment and Plan: Dana Coleman is a 80 year old female admitted following esophageal perforation, s/p repair, NSTEMI on 7/25, and new onset A. fib and PE on 05/20/2021.  Past medical history significant for hypertension, cholecystectomy, type 2 diabetes mellitus, breast cancer.   Protein calorie malnutrition/deconditioning status post G-tube placement Patient is postop day #5.  Continues to be no signs of infection or obstruction.  Patient is not on morphine.  Pain control regimen: Tylenol every 4 hours scheduled, Oxy 2.5 mg for severe pain as needed every 6 hours.  Patient received 1 dose of Oxy at 1606 (4:06PM) on 8/26.  She has had no overnight events and has had no episodes of emesis. -Continue dysphagia 2 diet, per SLP -Osmolite at 50 mL/hour -Continue with pain regimen as stated above -Lidocaine patches on as needed for pain near PEG tube -Nutrition  following  Abdominal pain Continues to improve. On physical exam abdomen was fairly benign with no guarding, rebound, distention, but still has tenderness on the left side.  Likely pain is from small amount of free air related to G-tube placement.  CT abdomen performed on 8/24 was negative for intra-abdominal pathology as previously mentioned.  No evidence of infection patient is afebrile with normal WBC. All of this is reassuring. LFTs have been slightly elevated, monitor and possibly change pain regimen of Tylenol depending on results.  -Continue to monitor G-tube site -Serial abdominal exams  Elevated potassium Pt had K level of 5.4, so we gave a dose LoKelma and held the pt's entresto. Morning BMP was obtained.  -Updated K 5.0  -If pt has K>6; obtain EKG -Can restart entresto tomorrow if potassium remains wnl  -Closely monitor   Pulmonary embolism Patient has a history of needing respiratory assistance during this admission.  However she has been on room air and is satting well greater than 92%.  In the room today, she has a normal RR and was sat ~98-99%  Positive acid fast bacilli from pleural fluid  Presence of acid-fast bacilli.  Respiratory status remained stable.  Discuss with pulmonology and F/U culture   DM2 CBG: 227 > 219 > 215.  Glucose continues to rise. Currently on Lantus 5U -Continue Lantus 5U -Added sensitive sliding scale   FEN/GI: Dysphagia 2 diet, per SLP PPx: Eliquis Dispo: SNF pending placement. Barriers include G-tube toleration, bed availability.   Subjective:  Pt reports that she is feeling much better than previously. She has had her pain controlled with Tylenol.   Objective: Temp:  [97.8 F (36.6 C)-98.7 F (37.1 C)] 98.7 F (37.1 C) (08/27 0424) Pulse Rate:  [89-95]  90 (08/26 2110) Resp:  [18-22] 18 (08/27 0424) BP: (112-127)/(73-82) 127/76 (08/27 0424) SpO2:  [95 %-98 %] 98 % (08/27 0424) Physical Exam Constitutional:      General: She is not  in acute distress.    Appearance: She is not ill-appearing.  Cardiovascular:     Rate and Rhythm: Normal rate and regular rhythm.     Heart sounds: No murmur heard. Pulmonary:     Effort: Pulmonary effort is normal.  Abdominal:     General: Bowel sounds are normal. There is no distension.     Palpations: Abdomen is soft.     Tenderness: There is abdominal tenderness (LLQ/LUQ).  Skin:    General: Skin is warm and dry.     Comments: Clean and dry dressing over G-Tube. No evidence of infection   Neurological:     Mental Status: She is alert.  Psychiatric:        Mood and Affect: Mood normal.        Behavior: Behavior normal.   Laboratory: Recent Labs  Lab 06/03/21 0500 06/05/21 1124 06/06/21 0614  WBC 9.3 8.5 7.7  HGB 9.7* 9.6* 9.9*  HCT 29.9* 29.4* 31.0*  PLT 241 206 211   Recent Labs  Lab 06/05/21 1124 06/06/21 0614 06/07/21 1344 06/07/21 2033 06/08/21 0500  NA 135 136 135  --  136  K 4.8 4.8 5.4* 5.5* 5.0  CL 105 107 105  --  105  CO2 '24 24 24  '$ --  26  BUN 30* 21 18  --  17  CREATININE 0.62 0.55 0.56  --  0.58  CALCIUM 8.4* 8.5* 8.5*  --  8.6*  PROT 6.1* 6.0* 6.1*  --   --   BILITOT 0.2* 0.4 0.5  --   --   ALKPHOS 66 70 80  --   --   ALT 40 47* 69*  --   --   AST 43* 56* 75*  --   --   GLUCOSE 194* 119* 270*  --  232*     Erskine Emery, MD 06/08/2021, 6:59 AM PGY-1, [place] Intern pager: [phone], text pages welcome

## 2021-06-08 NOTE — Progress Notes (Signed)
Interim Progress Note  Discussed patient's hyperkalemia with RD, Carly Madigan.  She would not advise switching feeds at this time as the other option would be high in fat and she does not feel patient would tolerate well.  Discussed that we are holding patient's Entresto which could also be contributing to her hyperkalemia.  Discussed continuing to monitor her potassium while we hold this medication and rechecking tomorrow. If potassium continues to be elevated we may explore the option of switching feeds.

## 2021-06-09 DIAGNOSIS — E43 Unspecified severe protein-calorie malnutrition: Secondary | ICD-10-CM | POA: Diagnosis not present

## 2021-06-09 DIAGNOSIS — Z9889 Other specified postprocedural states: Secondary | ICD-10-CM | POA: Diagnosis not present

## 2021-06-09 DIAGNOSIS — E875 Hyperkalemia: Secondary | ICD-10-CM | POA: Diagnosis not present

## 2021-06-09 DIAGNOSIS — L8990 Pressure ulcer of unspecified site, unspecified stage: Secondary | ICD-10-CM | POA: Diagnosis not present

## 2021-06-09 LAB — COMPREHENSIVE METABOLIC PANEL
ALT: 63 U/L — ABNORMAL HIGH (ref 0–44)
AST: 52 U/L — ABNORMAL HIGH (ref 15–41)
Albumin: 1.7 g/dL — ABNORMAL LOW (ref 3.5–5.0)
Alkaline Phosphatase: 82 U/L (ref 38–126)
Anion gap: 3 — ABNORMAL LOW (ref 5–15)
BUN: 17 mg/dL (ref 8–23)
CO2: 27 mmol/L (ref 22–32)
Calcium: 8.7 mg/dL — ABNORMAL LOW (ref 8.9–10.3)
Chloride: 106 mmol/L (ref 98–111)
Creatinine, Ser: 0.61 mg/dL (ref 0.44–1.00)
GFR, Estimated: >60 mL/min (ref >60)
Glucose, Bld: 218 mg/dL — ABNORMAL HIGH (ref 70–99)
Potassium: 4.9 mmol/L (ref 3.5–5.1)
Sodium: 136 mmol/L (ref 135–145)
Total Bilirubin: 0.2 mg/dL — ABNORMAL LOW (ref 0.3–1.2)
Total Protein: 5.8 g/dL — ABNORMAL LOW (ref 6.5–8.1)

## 2021-06-09 LAB — GLUCOSE, CAPILLARY
Glucose-Capillary: 159 mg/dL — ABNORMAL HIGH (ref 70–99)
Glucose-Capillary: 175 mg/dL — ABNORMAL HIGH (ref 70–99)
Glucose-Capillary: 180 mg/dL — ABNORMAL HIGH (ref 70–99)
Glucose-Capillary: 261 mg/dL — ABNORMAL HIGH (ref 70–99)

## 2021-06-09 LAB — CBC
HCT: 30.2 % — ABNORMAL LOW (ref 36.0–46.0)
Hemoglobin: 9.5 g/dL — ABNORMAL LOW (ref 12.0–15.0)
MCH: 29.7 pg (ref 26.0–34.0)
MCHC: 31.5 g/dL (ref 30.0–36.0)
MCV: 94.4 fL (ref 80.0–100.0)
Platelets: 229 10*3/uL (ref 150–400)
RBC: 3.2 MIL/uL — ABNORMAL LOW (ref 3.87–5.11)
RDW: 17.7 % — ABNORMAL HIGH (ref 11.5–15.5)
WBC: 6.1 10*3/uL (ref 4.0–10.5)
nRBC: 0 % (ref 0.0–0.2)

## 2021-06-09 LAB — HEPATITIS B SURFACE ANTIGEN: Hepatitis B Surface Ag: NONREACTIVE

## 2021-06-09 LAB — HEPATITIS C ANTIBODY: HCV Ab: NONREACTIVE

## 2021-06-09 MED ORDER — ACETAMINOPHEN 325 MG PO TABS
650.0000 mg | ORAL_TABLET | Freq: Three times a day (TID) | ORAL | Status: DC
  Administered 2021-06-09 – 2021-06-17 (×25): 650 mg
  Filled 2021-06-09 (×26): qty 2

## 2021-06-09 MED ORDER — PSYLLIUM 95 % PO PACK
1.0000 | PACK | Freq: Four times a day (QID) | ORAL | Status: DC
  Administered 2021-06-09 – 2021-06-11 (×8): 1 via ORAL
  Filled 2021-06-09 (×11): qty 1

## 2021-06-09 MED ORDER — SACUBITRIL-VALSARTAN 24-26 MG PO TABS
1.0000 | ORAL_TABLET | Freq: Two times a day (BID) | ORAL | Status: DC
  Administered 2021-06-09: 1 via ORAL
  Filled 2021-06-09: qty 1

## 2021-06-09 NOTE — Progress Notes (Signed)
FPTS Interim Progress Note  Went to evaluate patient during night rounds, patient resting comfortably in bed. In no distress and no apparent concerns noted. Vitals and chart reviewed. No changes to plan, continue current plan per day team note.  Donney Dice, DO 06/09/2021, 2:06 AM PGY-2, [place] Medicine Service pager [phone]

## 2021-06-09 NOTE — Progress Notes (Signed)
Contacted patient's sister in order to provide updates on today's management.  Updated on holding Entresto, electrolyte replacement, adding Metamucil.  Sister had questions regarding patient's specific dietary items listed on the dysphagia 2 diet.  She also had questions regarding G-tube feedings.  Questions were answered.  Patient's sister was thankful for call and updates.  Eulis Foster, MD  [facility] Service, PGY-2  [place] Intern Pager [phone]

## 2021-06-09 NOTE — Progress Notes (Addendum)
Family Medicine Teaching Service Daily Progress Note Intern Pager: [phone]  Patient name: Dana Coleman Medical record number: [id number] Date of birth: 1940/12/26 Age: 80 y.o. Gender: female  Primary Care Provider: Gifford Shave, MD Consultants: GI, cardiology, pulmonology, CVTS Code Status: Partial  Pt Overview and Major Events to Date:  7/23 admitted to ICU, intubated, right pigtail catheter placed 7/24- CRRT, emergent right thoracotomy, repair of esophageal perforation with intercostal pedicled muscle flap and drainage of empyema and decortication of right lung 7/25- NSTEMI, no intervention 7/26- extubated, TPN started 8/1- esophagram without leak, PO started, heparin gtt d/c because of increased bleeding from chest tubes. Transfused 1u pRBC 8/3: anterior chest tube removed 8/4: remaining chest tube removed 8/8: increased O2 requirement, Afib RVR -> PE with right heart strain 8/9: further increased O2 requirement, Afib 8/13: started Entresto, decreasing supplemental oxygen requirement 8/22: G-tube placed  Assessment and Plan: Dana Coleman is a 80 year old female admitted following esophageal perforation, s/p repair, NSTEMI on 7/25, and new onset A. fib and PE on 05/20/2021.  Past medical history significant for hypertension, cholecystectomy, type 2 diabetes mellitus and breast cancer.    Protein calorie malnutrition/deconditioning status post G-tube placement Patient is postop day #6.  Continues to be no signs of infection or obstruction. Still pending today's blood work results. -Continue dysphagia 2 diet, per SLP -Osmolite at 50 mL/hour -Continue with pain regimen: oxycodone 2.5 mg q6h prn and tylenol 630 mg q4h  -Lidocaine patches on as needed for pain near PEG tube -continue to follow dietician recs    Abdominal pain Improving, likely secondary to G-tube insertion. Denies any this morning even when asked on multiple occasions.  -Continue to monitor G-tube  site -monitor clinically, serial abdominal exams   Hyperkalemia  Resolved, K 4.9.  -If pt has K>6; obtain EKG -entresto restarted  -consider obtaining Mg if hyperkalemic again   Atrial fibrillation HR 80-90s. Previously on entresto which was held given hyperkalemia.  -entresto restarted  -transitioned from metoprolol tartrate to metoprolol succinate    Pulmonary embolism Noted to have moderate bilateral acute PE with presence of heart strain. Stable on room air. -monitor clinically    Positive acid fast bacilli from pleural fluid  Presence of acid-fast bacilli. Reassuringly maintains appropriate respiratory status on room air. -follow up culture -consider discussing with pulmonology for possible further work up   Type 2 DM CBG: 168> 167> 175.   -sSSI -Continue Lantus 5 units -monitor CBGs   FEN/GI: dysphagia 2 diet  PPx: Eliquis Dispo:SNF possibly tomorrow. Barriers include G-tube.   Subjective:  No acute overnight events. Resting comfortably but awakens to my exam. Patient denies dyspnea, chest pain and abdominal pain. No concerns at this time.   Objective: Temp:  [97.9 F (36.6 C)-98.7 F (37.1 C)] 98.2 F (36.8 C) (08/27 2354) Pulse Rate:  [82-90] 82 (08/27 2354) Resp:  [18-20] 20 (08/27 2354) BP: (120-127)/(70-76) 121/72 (08/27 2354) SpO2:  [97 %-98 %] 97 % (08/27 2354) Physical Exam: General: Patient sleeping comfortably in bed, in no acute distress. Cardiovascular: RRR, no murmurs or gallops auscultated  Respiratory: CTAB, good air movement noted along all lung fields anteriorly, no increased work of breathing noted, breathing comfortably without any signs of respiratory distress Abdomen: soft, nontender, presence of bowel sounds Extremities: no LE edema noted bilaterally, radial and distal pulses strong and equal bilaterally    Laboratory: Recent Labs  Lab 06/03/21 0500 06/05/21 1124 06/06/21 0614  WBC 9.3 8.5 7.7  HGB 9.7* 9.6* 9.9*  HCT 29.9*  29.4* 31.0*  PLT 241 206 211   Recent Labs  Lab 06/06/21 0614 06/07/21 1344 06/07/21 2033 06/08/21 0500  NA 136 135  --  136  K 4.8 5.4* 5.5* 5.0  CL 107 105  --  105  CO2 24 24  --  26  BUN 21 18  --  17  CREATININE 0.55 0.56  --  0.58  CALCIUM 8.5* 8.5*  --  8.6*  PROT 6.0* 6.1*  --  5.8*  BILITOT 0.4 0.5  --  0.5  ALKPHOS 70 80  --  82  ALT 47* 69*  --  72*  AST 56* 75*  --  69*  GLUCOSE 119* 270*  --  232*      Imaging/Diagnostic Tests: Results for orders placed or performed during the hospital encounter of 05/04/21 (from the past 24 hour(s))  Basic metabolic panel     Status: Abnormal   Collection Time: 06/08/21  5:00 AM  Result Value Ref Range   Sodium 136 135 - 145 mmol/L   Potassium 5.0 3.5 - 5.1 mmol/L   Chloride 105 98 - 111 mmol/L   CO2 26 22 - 32 mmol/L   Glucose, Bld 232 (H) 70 - 99 mg/dL   BUN 17 8 - 23 mg/dL   Creatinine, Ser 0.58 0.44 - 1.00 mg/dL   Calcium 8.6 (L) 8.9 - 10.3 mg/dL   GFR, Estimated >60 >60 mL/min   Anion gap 5 5 - 15  Hepatic function panel     Status: Abnormal   Collection Time: 06/08/21  5:00 AM  Result Value Ref Range   Total Protein 5.8 (L) 6.5 - 8.1 g/dL   Albumin 1.7 (L) 3.5 - 5.0 g/dL   AST 69 (H) 15 - 41 U/L   ALT 72 (H) 0 - 44 U/L   Alkaline Phosphatase 82 38 - 126 U/L   Total Bilirubin 0.5 0.3 - 1.2 mg/dL   Bilirubin, Direct <0.1 0.0 - 0.2 mg/dL   Indirect Bilirubin NOT CALCULATED 0.3 - 0.9 mg/dL  Glucose, capillary     Status: Abnormal   Collection Time: 06/08/21  6:15 AM  Result Value Ref Range   Glucose-Capillary 201 (H) 70 - 99 mg/dL   Comment 1 Notify RN    Comment 2 Document in Chart   Glucose, capillary     Status: Abnormal   Collection Time: 06/08/21 12:41 PM  Result Value Ref Range   Glucose-Capillary 168 (H) 70 - 99 mg/dL  HIV Antibody (routine testing w rflx)     Status: None   Collection Time: 06/08/21  3:39 PM  Result Value Ref Range   HIV Screen 4th Generation wRfx Non Reactive Non Reactive   Glucose, capillary     Status: Abnormal   Collection Time: 06/08/21  6:26 PM  Result Value Ref Range   Glucose-Capillary 167 (H) 70 - 99 mg/dL  Glucose, capillary     Status: Abnormal   Collection Time: 06/08/21 11:55 PM  Result Value Ref Range   Glucose-Capillary 175 (H) 70 - 99 mg/dL     Donney Dice, DO 06/09/2021, 2:23 AM PGY-2, [place] Intern pager: [phone], text pages welcome

## 2021-06-09 NOTE — Progress Notes (Signed)
FPTS Brief Progress Note  S Saw patient at bedside this evening. Patient was sleeping comfortably. I did not wake the patient.  No concerns from night RN.  O: BP 116/75 (BP Location: Right Arm)   Pulse 86   Temp 98.5 F (36.9 C) (Oral)   Resp 20   Ht '5\' 1"'$  (1.549 m)   Wt 72 kg   SpO2 96%   BMI 29.99 kg/m    General: Sleeping comfortably  A/P: Plan per day team  - Orders reviewed. Labs for AM ordered, which was adjusted as needed.   Lattie Haw, MD 06/09/2021, 10:54 PM PGY-3, [facility] Night Resident  Please page [phone] with questions.

## 2021-06-09 NOTE — Plan of Care (Signed)
  Problem: Education: Goal: Knowledge of General Education information will improve Description Including pain rating scale, medication(s)/side effects and non-pharmacologic comfort measures Outcome: Progressing   

## 2021-06-09 NOTE — Progress Notes (Addendum)
Family Medicine Teaching Service Daily Progress Note Intern Pager: [phone]  Patient name: Dana Coleman Medical record number: [id number] Date of birth: [birth year] Age: 80 y.o. Gender: female  Primary Care Provider: Gifford Shave, MD Consultants: GI, cardio, pulm, CVTS Code Status: Partial  Pt Overview and Major Events to Date:  7/23 admitted to ICU, intubated, right pigtail catheter placed 7/24- CRRT, emergent right thoracotomy, repair of esophageal perforation with intercostal pedicled muscle flap and drainage of empyema and decortication of right lung 7/25- NSTEMI, no intervention 7/26- extubated, TPN started 8/1- esophagram without leak, PO started, heparin gtt d/c because of increased bleeding from chest tubes. Transfused 1u pRBC 8/3: anterior chest tube removed 8/4: remaining chest tube removed 8/8: increased O2 requirement, Afib RVR -> PE with right heart strain 8/9: further increased O2 requirement, Afib 8/13: started Entresto, decreasing supplemental oxygen requirement 8/22: G-tube placed  Assessment and Plan:  Dana Coleman is a 80 yo female admitted following esophageal perforation, s/p repair, NSTEM on 7/25, and new onset Afib and PE on 05/20/21. PMH significant for HTN, cholecystectomy, DM2 and breast cancer.   Protein calorie malnutrition/deconditioning s/p G-tube placement  Post op day #7. No evidence of infection or obstruction. Site of placement shows no signs of infection today. Pt remains afebrile and VSS. WBC has been stable.  -Dysphagia 2 diet, per SLP -Osmolyte 50 mL/hour  -Current pain regimen: oxycodone 2.5 mg q6h prn and tylenol 630 mg q4h  -Lidocaine patches PRN for pain near G-tube  -Nutrition following, appreciate recs  Elevated LFTs, improving Unknown source of elevation. Tylenol level has been decreased. LFT trend AST/ALT: 52/63>39/52. Unknown if abdominal pain is related, but abdominal pain is also improving.  -Continue with current pain  regimen as stated above -Monitor LFTs and symptoms   Abdominal Pain, resolved  Pt had presence of abdominal pain after G-tube insertion. Likely 2/2 to G-tube insertion. No evidence of intra-abdominal pathology present on CT scan on this admission. Today she reports that there is some pain today, tylenol helps with the pain.  -Tylenol for pain  -Monitor symptoms  -Serial abd exams   Hyperkalemia, resolved  Pt had a K of 4.9, entresto was stopped and pt was given Lokelma. K decreased to 4 and is 4.8 today. -If K>6-will get EKG -entresto has been held, may restart outpatient  -Monitor with BMP   Afib  HR 89 and stable. [name] has been held as discussed above.  -Hold enstresto until outpatient -Metoprolol tartrate changed to succinate   PE Evidence of bilateral acute PE with heart strain. SPO2 has been stable on RA for a few days, satting at 97% today.  -Monitor sats   Positive acid fast bacilli from pleural fluid  Presence on this admission. Pt remains stable from respiratory standpoint -Monitor   DM2 CBGs: 175>194 -sSSI was started after several elevated CBGs in 200s -Lantus 5U -Monitor CBGs   FEN/GI: Dysphagia 2 diet  PPx: Eliquis  Dispo:Stable for SNF placement   Subjective:  She is doing well today with no complaints.  She only has minor abdominal pain today.  Any chest pain or shortness of breath.  Objective: Temp:  [98.3 F (36.8 C)-98.6 F (37 C)] 98.3 F (36.8 C) (08/29 1127) Pulse Rate:  [84-89] 85 (08/29 1127) Resp:  [20] 20 (08/29 1127) BP: (116-138)/(73-79) 135/79 (08/29 1127) SpO2:  [96 %-98 %] 96 % (08/29 1127) Physical Exam Constitutional:      General: She is not in acute distress.    Appearance:  She is not ill-appearing.  Cardiovascular:     Rate and Rhythm: Normal rate and regular rhythm.     Heart sounds: No murmur heard. Pulmonary:     Effort: Pulmonary effort is normal. No respiratory distress.     Breath sounds: Normal breath sounds.   Abdominal:     General: Bowel sounds are normal. There is no distension.     Palpations: Abdomen is soft.     Comments: Left upper and lower quadrant abdominal tenderness surrounding the G-tube  Skin:    General: Skin is warm and dry.     Comments: G-tube in place.  Dressing clean and intact.  Tenderness around G-tube  Neurological:     Mental Status: She is alert.  Psychiatric:        Mood and Affect: Mood normal.        Behavior: Behavior normal.     Laboratory: Recent Labs  Lab 06/05/21 1124 06/06/21 0614 06/09/21 0600  WBC 8.5 7.7 6.1  HGB 9.6* 9.9* 9.5*  HCT 29.4* 31.0* 30.2*  PLT 206 211 229   Recent Labs  Lab 06/08/21 0500 06/09/21 0600 06/10/21 0452  NA 136 136 136  K 5.0 4.9 4.8  CL 105 106 103  CO2 '26 27 27  '$ BUN '17 17 15  '$ CREATININE 0.58 0.61 0.59  CALCIUM 8.6* 8.7* 8.9  PROT 5.8* 5.8* 5.7*  BILITOT 0.5 0.2* 0.3  ALKPHOS 82 82 73  ALT 72* 63* 52*  AST 69* 52* 39  GLUCOSE 232* 218* 204*      Erskine Emery, MD 06/10/2021, 11:55 AM PGY-1, [place] Intern pager: [phone], text pages welcome

## 2021-06-10 DIAGNOSIS — I2699 Other pulmonary embolism without acute cor pulmonale: Secondary | ICD-10-CM | POA: Diagnosis not present

## 2021-06-10 LAB — COMPREHENSIVE METABOLIC PANEL
ALT: 52 U/L — ABNORMAL HIGH (ref 0–44)
AST: 39 U/L (ref 15–41)
Albumin: 1.7 g/dL — ABNORMAL LOW (ref 3.5–5.0)
Alkaline Phosphatase: 73 U/L (ref 38–126)
Anion gap: 6 (ref 5–15)
BUN: 15 mg/dL (ref 8–23)
CO2: 27 mmol/L (ref 22–32)
Calcium: 8.9 mg/dL (ref 8.9–10.3)
Chloride: 103 mmol/L (ref 98–111)
Creatinine, Ser: 0.59 mg/dL (ref 0.44–1.00)
GFR, Estimated: >60 mL/min (ref >60)
Glucose, Bld: 204 mg/dL — ABNORMAL HIGH (ref 70–99)
Potassium: 4.8 mmol/L (ref 3.5–5.1)
Sodium: 136 mmol/L (ref 135–145)
Total Bilirubin: 0.3 mg/dL (ref 0.3–1.2)
Total Protein: 5.7 g/dL — ABNORMAL LOW (ref 6.5–8.1)

## 2021-06-10 LAB — GLUCOSE, CAPILLARY
Glucose-Capillary: 165 mg/dL — ABNORMAL HIGH (ref 70–99)
Glucose-Capillary: 175 mg/dL — ABNORMAL HIGH (ref 70–99)
Glucose-Capillary: 187 mg/dL — ABNORMAL HIGH (ref 70–99)
Glucose-Capillary: 189 mg/dL — ABNORMAL HIGH (ref 70–99)
Glucose-Capillary: 194 mg/dL — ABNORMAL HIGH (ref 70–99)

## 2021-06-10 LAB — SARS CORONAVIRUS 2 (TAT 6-24 HRS): SARS Coronavirus 2: NEGATIVE

## 2021-06-10 NOTE — Progress Notes (Signed)
Mobility Specialist: Progress Note   06/10/21 1704  Mobility  Activity Dangled on edge of bed  Level of Assistance Moderate assist, patient does 50-74%  Assistive Device None  Mobility Sit up in bed/chair position for meals  Mobility Response Tolerated well  Mobility performed by Mobility specialist  Bed Position High-fowlers  $Mobility charge 1 Mobility   Pre-Mobility: 91 HR, 131/77 BP, 98% SpO2 During Mobility: 95 HR, 95% SpO2 Post-Mobility: 91 HR, 137/84 BP, 98% SpO2  Pt required modA to sit EOB from supine. Pt c/o dizziness upon sitting EOB, otherwise asx. Pt sat EOB for 8 minutes and performed LE exercises. Pt back in bed with call bell at her side and bed alarm is on.   [facility] Arlin [place] Mobility Specialist Mobility Specialist Phone: [phone]

## 2021-06-10 NOTE — Progress Notes (Signed)
FPTS Brief Progress Note  S Saw patient at bedside this evening. Patient was sleeping comfortably. I did not wake the patient.  O: BP 137/84 (BP Location: Right Arm)   Pulse 86   Temp 98.5 F (36.9 C) (Oral)   Resp 20   Ht '5\' 1"'$  (1.549 m)   Wt 72 kg   SpO2 99%   BMI 29.99 kg/m    General:  sleeping comfortably  A/P: Plan per day team  - Orders reviewed. Labs for AM not ordered, which was adjusted as needed.   Lattie Haw, MD 06/10/2021, 8:11 PM PGY-3, [place] [facility] Night Resident  Please page [phone] with questions.

## 2021-06-10 NOTE — TOC Progression Note (Signed)
Transition of Care [facility]) - Progression Note    Patient Details  Name: Dana Coleman MRN: [id number] Date of Birth: 1941-04-02  Transition of Care [facility]) CM/SW [place], [place] Phone Number: 06/10/2021, 2:57 PM  Clinical Narrative:     CSW met with patient at bedside to discuss disposition plan. Patient states she is agreeable to short term rehab at [facility]. CSW informed of bed offers. Patient states she did not want to go to [facility].  Patient requested CSW to all her sister,[name]. CSW spoke with patient's sister [name],([phone] cell ) provided update and informed of bed offers. Family requested CSW send referral to [facility]. CSW sent referral - waiting on response.   CSW will continue to follow and assist with discharge planning.   Thurmond Butts, MSW, LCSW Clinical Social Worker    Expected Discharge Plan: Skilled Nursing Facility Barriers to Discharge: Continued Medical Work up, SNF Pending bed offer  Expected Discharge Plan and Services Expected Discharge Plan: [place] In-house Referral: Clinical Social Work     Living arrangements for the past 2 months: Single Family Home                                       Social Determinants of Health (SDOH) Interventions    Readmission Risk Interventions No flowsheet data found.

## 2021-06-10 NOTE — Progress Notes (Signed)
Physical Therapy Treatment Patient Details Name: Dana Coleman MRN: [id number] DOB: [birth year] Today's Date: 06/10/2021    History of Present Illness Pt is a 80 yo female admitted on 05/04/21 for AMS. HX of cholecystectomy on 7/21.  Intubated upon admission, 7/24 Started CRRT, Esophageal perforation with septic shock post flap repair, drainage of empyema and decortication of R lung with chest tube placed.  7/25 EKG changes and probable nstemi. Extubated 7/26. CT removed 8/4. Pt with new PE with R heart strain and increased O2 HFNC requirements 8/8. G-tube placed 8/22. PMH includes: R breast CA, DM, CAD, HTN.    PT Comments    Today's skilled session focused on LE strengthening. Pt with reports of mild shortness of breath with some ex's that resolved with rest breaks. Pt deferred EOB this session due to fatigue, agreeable to ex's. Acute PT to continue.     Follow Up Recommendations  SNF;Supervision/Assistance - 24 hour     Equipment Recommendations  Rolling walker with 5" wheels;Wheelchair (measurements PT);Wheelchair cushion (measurements PT)    Recommendations for Other Services       Precautions / Restrictions Precautions Precautions: Fall Precaution Comments: watch HR/RR, G tube Restrictions Weight Bearing Restrictions: No    Cognition Arousal/Alertness: Awake/alert Behavior During Therapy: Anxious Overall Cognitive Status: No family/caregiver present to determine baseline cognitive functioning             Memory: Decreased short-term memory;Decreased recall of precautions Following Commands: Follows one step commands with increased time Safety/Judgement: Decreased awareness of safety;Decreased awareness of deficits Awareness: Emergent Problem Solving: Requires verbal cues;Decreased initiation General Comments: pt continues to be anxious with movements, initially did not want to work with PT with encouragement agreed to some PT      Exercises General Exercises -  Lower Extremity Ankle Circles/Pumps: AROM;Strengthening;Both;10 reps;Supine;Limitations Ankle Circles/Pumps Limitations: manual resistance with PF Quad Sets: AROM;Strengthening;Both;10 reps;Supine;Limitations Quad Sets Limitations: 5 sec holds Short Arc Quad: AROM;Strengthening;Both;10 reps;Supine;Limitations Short Arc Quad Limitations: 5 sec holds with cues for controlled lowering Hip ABduction/ADduction: AAROM;Strengthening;Both;10 reps;Supine;Limitations Hip Abduction/Adduction Limitations: min active assist Straight Leg Raises: AROM;AAROM;Strengthening;Both;10 reps;Supine;Limitations Straight Leg Raises Limitations: assist for controlled lowering with each rep     Pertinent Vitals/Pain Pain Assessment: No/denies pain     PT Goals (current goals can now be found in the care plan section) Acute Rehab PT Goals Patient Stated Goal: stop the pain PT Goal Formulation: With patient Time For Goal Achievement: 06/12/21 Potential to Achieve Goals: Fair Progress towards PT goals: Progressing toward goals    Frequency    Min 3X/week      PT Plan Current plan remains appropriate    AM-PAC PT "6 Clicks" Mobility   Outcome Measure      Help needed moving to and from a bed to a chair (including a wheelchair)?: A Lot Help needed standing up from a chair using your arms (e.g., wheelchair or bedside chair)?: A Lot Help needed to walk in hospital room?: A Lot Help needed climbing 3-5 steps with a railing? : Total 6 Click Score: 7    End of Session Equipment Utilized During Treatment: Gait belt Activity Tolerance: Patient tolerated treatment well;Patient limited by fatigue Patient left: in bed;with call bell/phone within reach;with bed alarm set;with nursing/sitter in room Nurse Communication: Mobility status PT Visit Diagnosis: Other abnormalities of gait and mobility (R26.89);Muscle weakness (generalized) (M62.81)     Time: [id number] PT Time Calculation (min) (ACUTE ONLY):  13 min  Charges:  $Therapeutic Exercise: 8-22 mins  Willow Ora, PTA, [employer] [phone] 06/10/21, 10:40 AM    Willow Ora 06/10/2021, 10:40 AM

## 2021-06-10 NOTE — Progress Notes (Addendum)
Inpatient Diabetes Program Recommendations  AACE/ADA: New Consensus Statement on Inpatient Glycemic Control (2015)  Target Ranges:  Prepandial:   less than 140 mg/dL      Peak postprandial:   less than 180 mg/dL (1-2 hours)      Critically ill patients:  140 - 180 mg/dL   Lab Results  Component Value Date   GLUCAP 194 (H) 06/10/2021   HGBA1C 7.4 (H) 04/26/2021    Review of Glycemic Control Results for Dana Coleman, Dana Coleman (MRN [id number]) as of 06/10/2021 12:14  Ref. Range 06/09/2021 12:04 06/09/2021 17:58 06/10/2021 00:05 06/10/2021 06:18 06/10/2021 11:24  Glucose-Capillary Latest Ref Range: 70 - 99 mg/dL 261 (H) 159 (H) 189 (H) 175 (H) 194 (H)   Diabetes history: DM 2 Outpatient Diabetes medications:  Lantus 24 units q AM, Jardiance 10 mg daily, Metformin 500 mg bid Current orders for Inpatient glycemic control:  Novolog sensitive tid with meals  Semglee 5 units daily Inpatient Diabetes Program Recommendations:    Please increase Semglee to 10 units daily. Also consider increasing frequency of Novolog to q 6 hours since patient is on continuous tube feeds.   Thanks  Adah Perl, RN, BC-ADM Inpatient Diabetes Coordinator Pager [phone]  (8a-5p)

## 2021-06-10 NOTE — Progress Notes (Addendum)
Family Medicine Teaching Service Daily Progress Note Intern Pager: [phone]  Patient name: Dana Coleman Medical record number: [id number] Date of birth: 1941/03/26 Age: 80 y.o. Gender: female  Primary Care Provider: Gifford Shave, MD Consultants: GI, cardio, pulm, CVTS Code Status: partial   Pt Overview and Major Events to Date:  7/23 Admitted to ICU, intubated, right pigtail catheter placed 7/24 CRRT, emergent right thoracotomy, repair of esophageal perforation with intercostal pedicled muscle flap and drainage of empyema and decortication of right lung  7/25 NSTEMI, no intervention 7/26 Extubated, TPN started  8/1 Esophagram without leak, PO started, heparin gtt d/c because of increased bleeding from chest tubes. Transfused 1U pRBCs 8/3 Anterior chest tube removed  8/4 Remaining chest tube removed  8/8 increasing O2 requirement  8/9 Further increased O2 requirement, Afib 8/13 started Entresto, decreasing supplemental oxygen requirement  8/22 G-tube placed  Assessment and Plan:  Dana Coleman is a 80 year old female admitted following esophageal perforation status postrepair with NSTEMI on 7/25 for new onset A. fib and PE on 05/20/2021.  Past medical history significant for hypertension, cholecystectomy, DM 2, and breast cancer.   Routine calorie malnutrition/deconditioning status post G-tube placement Postop day 8 with no evidence of infection or obstruction and site of placement. Shows no evidence of infection on physical exam today and patient has remained afebrile. Vital signs have been stable.  -Dysphagia diet to per SLP -Osmolite 50 mL/hour -Current pain regimen: Oxycodone 2.5 mg every 6 hours as needed and Tylenol 630 mg every 4 hours -Lidocaine patches as needed for pain your G-tube -Nutrition is following appreciate recommendations -Zofran x1 for nausea -Place PIV and remove PICC line  -D/C metamucil and continue with Fibercon   Elevated LFTs,  improving Consulted elevation, Tylenol for pain control has been decreased.  LFTs have been downtrending AST/ALT: 52/63>39/52  Abdominal pain, resolved Patient in presence of abdominal pain after G-tube insertion that required a CT scan that showed no evidence of intra-abdominal pathology.  She has been feeling much better today with minimum abdominal pain on physical exam.  She has been receiving Tylenol for the pain. -Tylenol for pain -Monitor symptoms -Abdominal exams  Hyperkalemia, resolved Patient had a potassium of 4.9 with Entresto stopped at that time and patient was given Lokelma.  Potassium has since decreased from 5 > 4.9 > 4.8 -Hold entresto  AFib Heart rate 89 and stable.  [name] has been held -[employer] until outpatient -Metoprolol tartrate has been changed to succinate  PE Evidence of bilateral acute PE with heart strain O2 sats have been stable on room air for several days he is she is satting in the high 90s today -Monitor sats  Positive acid-fast bacilli from pleural fluid Presents on this admission.  Patient remained stable when monitoring respiratory status -Continue to monitor  DM2 CBG today: 187 > 165 > 203 -sSSI, Lantus 5U -Monitor CBGs  H/o CAD -restart home ASA '81mg'$  1xdaily   FEN/GI: Dysphagia 2 diet  PPx: Eliquis  Dispo:Stable for SNF placement today   Subjective:  Patient is doing well today with no complaints.  Denies any chest pain or shortness of breath with minimal abdominal pain.   Objective: Temp:  [97.9 F (36.6 C)-98.5 F (36.9 C)] 98.5 F (36.9 C) (08/30 1254) Pulse Rate:  [80-89] 80 (08/30 0800) Resp:  [17-22] 18 (08/30 1231) BP: (117-137)/(72-84) 117/72 (08/30 1231) SpO2:  [96 %-99 %] 96 % (08/30 1231) Weight:  [74 kg] 74 kg (08/30 0346) Physical Exam Constitutional:  General: She is not in acute distress.    Appearance: Normal appearance.     Comments: G-Tube in place with dressing covering. Dressing clean and  dry.   Cardiovascular:     Rate and Rhythm: Normal rate and regular rhythm.     Heart sounds: No murmur heard. Pulmonary:     Effort: Pulmonary effort is normal. No respiratory distress.     Breath sounds: Normal breath sounds.  Abdominal:     General: Bowel sounds are normal. There is no distension.     Palpations: Abdomen is soft.     Comments: G-Tube in place   Skin:    General: Skin is warm and dry.  Neurological:     Mental Status: She is alert.  Psychiatric:        Mood and Affect: Mood normal.        Behavior: Behavior normal.     Laboratory: Recent Labs  Lab 06/05/21 1124 06/06/21 0614 06/09/21 0600  WBC 8.5 7.7 6.1  HGB 9.6* 9.9* 9.5*  HCT 29.4* 31.0* 30.2*  PLT 206 211 229   Recent Labs  Lab 06/08/21 0500 06/09/21 0600 06/10/21 0452  NA 136 136 136  K 5.0 4.9 4.8  CL 105 106 103  CO2 '26 27 27  '$ BUN '17 17 15  '$ CREATININE 0.58 0.61 0.59  CALCIUM 8.6* 8.7* 8.9  PROT 5.8* 5.8* 5.7*  BILITOT 0.5 0.2* 0.3  ALKPHOS 82 82 73  ALT 72* 63* 52*  AST 69* 52* 39  GLUCOSE 232* 218* 204*     Erskine Emery, MD 06/11/2021, 12:57 PM PGY-1, [place] Intern pager: [phone], text pages welcome

## 2021-06-11 DIAGNOSIS — I2699 Other pulmonary embolism without acute cor pulmonale: Secondary | ICD-10-CM | POA: Diagnosis not present

## 2021-06-11 LAB — GLUCOSE, CAPILLARY
Glucose-Capillary: 203 mg/dL — ABNORMAL HIGH (ref 70–99)
Glucose-Capillary: 192 mg/dL — ABNORMAL HIGH (ref 70–99)
Glucose-Capillary: 189 mg/dL — ABNORMAL HIGH (ref 70–99)
Glucose-Capillary: 190 mg/dL — ABNORMAL HIGH (ref 70–99)

## 2021-06-11 MED ORDER — ONDANSETRON HCL 4 MG/2ML IJ SOLN
4.0000 mg | Freq: Four times a day (QID) | INTRAMUSCULAR | Status: DC | PRN
  Administered 2021-06-11 – 2021-06-17 (×4): 4 mg via INTRAVENOUS
  Filled 2021-06-11 (×4): qty 2

## 2021-06-11 MED ORDER — INSULIN ASPART 100 UNIT/ML IJ SOLN
0.0000 [IU] | Freq: Three times a day (TID) | INTRAMUSCULAR | Status: DC
  Administered 2021-06-11 – 2021-06-14 (×7): 2 [IU] via SUBCUTANEOUS
  Administered 2021-06-14: 1 [IU] via SUBCUTANEOUS
  Administered 2021-06-15: 2 [IU] via SUBCUTANEOUS
  Administered 2021-06-16: 1 [IU] via SUBCUTANEOUS
  Administered 2021-06-17: 2 [IU] via SUBCUTANEOUS

## 2021-06-11 MED ORDER — CALCIUM POLYCARBOPHIL 625 MG PO TABS
625.0000 mg | ORAL_TABLET | Freq: Every day | ORAL | Status: DC
  Administered 2021-06-11 – 2021-06-13 (×3): 625 mg via ORAL
  Filled 2021-06-11 (×4): qty 1

## 2021-06-11 MED ORDER — ASPIRIN EC 81 MG PO TBEC
81.0000 mg | DELAYED_RELEASE_TABLET | Freq: Every day | ORAL | Status: DC
  Administered 2021-06-11 – 2021-06-13 (×3): 81 mg via ORAL
  Filled 2021-06-11 (×3): qty 1

## 2021-06-11 NOTE — Progress Notes (Signed)
Nutrition Follow-up  DOCUMENTATION CODES:   Severe malnutrition in context of acute illness/injury  INTERVENTION:    Continue tube feeding:  -Osmolite 1.5 @ 50 ml/hr (1200 ml) via PEG -ProSource TF 45 ml BID  Provides: 1880 kcals, 97 grams protein, 914 ml free water.   -Obtain daily weights   NUTRITION DIAGNOSIS:   Severe Malnutrition related to acute illness as evidenced by moderate fat depletion, mild fat depletion, moderate muscle depletion.  Ongoing  GOAL:   Patient will meet greater than or equal to 90% of their needs  Addressed via TF  MONITOR:   Diet advancement, I & O's, Labs, Weight trends, Skin (TPN)  REASON FOR ASSESSMENT:   Consult Calorie Count  ASSESSMENT:   80 yo female admitted post recent lap cholecystectomy with acute respiratory failure and renal failure with severe lactic acidosis from sepsis and found to have esophageal perforation, empyema. PMH includes DM, HTN, HLD, CAD  7/21 Lap chole 7/23 Admitted, Intubated, Pigtail by CCS in ED with bilious output 7/24 CRRT initiated, R Thoracotomy, repair of esophageal perforation, drainage of empyema and decortication of right lung 7/26 Extubated, TPN initiated 7/31 Stop CRRT 8/01 Barium swallow, no leak 8/02 CL diet started 8/12 s/p MBSS- advanced to dysphagia 1 diet with thin liquids 8/22 PEG placed, TF started  8/24 TPN stopped   Intake remains poor. Last meal completion charted as 10%. Patient tolerating tube feeding at goal rate. Denies abdominal pain or nausea.   Noted hyperkalemic event on 8/26 suspected to be from [place]. Medication was held and potassium trending down. Given patient is likely to d/c to SNF, would not recommend changing from standard formula to speciality formula (Nepro) as patient does not have kidney disease and insurance will likely not cover the cost. Continue to trend potassium.   Admission weight: 58 kg  Current weight: 74 kg   UOP: 1600 ml x 24 hrs    Medications: SS novolog, semglee, fibercon Labs: CBG 165-203  Diet Order:   Diet Order             DIET DYS 2 Room service appropriate? Yes with Assist; Fluid consistency: Thin  Diet effective now                   EDUCATION NEEDS:   Not appropriate for education at this time  Skin:  Skin Assessment: Skin Integrity Issues: Skin Integrity Issues:: DTI Stage II: buttocks x2 Stage I: sacrum Incisions: abdomen/chest Skin tear: back   Last BM:  8/30  Height:   Ht Readings from Last 1 Encounters:  05/04/21 '5\' 1"'$  (1.549 m)    Weight:   Wt Readings from Last 1 Encounters:  06/11/21 74 kg   BMI:  Body mass index is 30.83 kg/m.  Estimated Nutritional Needs:   Kcal:  1850-2050 kcal  Protein:  95-115 grams  Fluid:  >/= 1.8 L/day  Mariana Single MS, RD, LDN, CNSC Clinical Nutrition Pager listed in [place]

## 2021-06-11 NOTE — Progress Notes (Signed)
Physical Therapy Treatment Patient Details Name: Dana Coleman MRN: [id number] DOB: 18-Mar-1941 Today's Date: 06/11/2021    History of Present Illness Pt is a 80 yo female admitted on 05/04/21 for AMS. HX of cholecystectomy on 7/21.  Intubated upon admission, 7/24 Started CRRT, Esophageal perforation with septic shock post flap repair, drainage of empyema and decortication of R lung with chest tube placed.  7/25 EKG changes and probable nstemi. Extubated 7/26. CT removed 8/4. Pt with new PE with R heart strain and increased O2 HFNC requirements 8/8. G-tube placed 8/22. PMH includes: R breast CA, DM, CAD, HTN.    PT Comments    Patient received in bed, she is agreeable to PT. She states she vomited after mouthwash this am. Patient reports she may need to use the bathroom. Assisted patient out of bed and to [facility] with min assist. Assisted to clean patient after and returned to bed taking steps at edge of bed with RW and min assist. Patient continues to be limited by fatigue and decreased endurance but only requiring +1 for standing and taking steps this date. She will continue to benefit from skilled PT while here to improve endurance and strength.      Follow Up Recommendations  SNF;Supervision/Assistance - 24 hour     Equipment Recommendations  Rolling walker with 5" wheels    Recommendations for Other Services       Precautions / Restrictions Precautions Precautions: Fall Precaution Comments: watch HR/RR, G tube Restrictions Weight Bearing Restrictions: No    Mobility  Bed Mobility Overal bed mobility: Needs Assistance Bed Mobility: Supine to Sit;Sit to Supine     Supine to sit: Min assist;HOB elevated Sit to supine: Min assist   General bed mobility comments: patient requires cues and increased time for mobility. Aassist needed to bring legs off bed and to raise trunk to seated position. She does a good job bridging in bed to reposition and straighten bed pads with cues.     Transfers Overall transfer level: Needs assistance Equipment used: Rolling walker (2 wheeled) Transfers: Sit to/from [facility] Sit to Stand: Min guard Stand pivot transfers: Min guard       General transfer comment: patient able to stand with min assist/min guard. Pivoted to [facility]  Ambulation/Gait Ambulation/Gait assistance: Min assist Gait Distance (Feet): 6 Feet Assistive device: Rolling walker (2 wheeled) Gait Pattern/deviations: Decreased step length - right;Decreased step length - left;Step-through pattern Gait velocity: decr   General Gait Details: patient ambulated back to bed and was able to take a few steps along edge of bed to get up to [facility]. Min assist.   Marine scientist [place] (Stroke Patients Only)       Balance Overall balance assessment: Needs assistance Sitting-balance support: Feet supported Sitting balance-Leahy Scale: Good Sitting balance - Comments: able to sit without external assist.   Standing balance support: Bilateral upper extremity supported;During functional activity Standing balance-Leahy Scale: Fair Standing balance comment: dependent on UE support and min assist for safety                            Cognition Arousal/Alertness: Awake/alert Behavior During Therapy: WFL for tasks assessed/performed Overall Cognitive Status: Within Functional Limits for tasks assessed  Exercises      General Comments        Pertinent Vitals/Pain Pain Assessment: Faces Faces Pain Scale: Hurts little more Pain Location: L abdomen at G-tube site Pain Descriptors / Indicators: Grimacing;Guarding;Sore Pain Intervention(s): Monitored during session;Repositioned    Home Living                      Prior Function            PT Goals (current goals can now be found in the care plan section) Acute Rehab PT  Goals Patient Stated Goal: feel better PT Goal Formulation: With patient Time For Goal Achievement: 06/21/21 Potential to Achieve Goals: Fair Progress towards PT goals: Progressing toward goals    Frequency    Min 3X/week      PT Plan Current plan remains appropriate    Co-evaluation              AM-PAC PT "6 Clicks" Mobility   Outcome Measure  Help needed turning from your back to your side while in a flat bed without using bedrails?: A Little Help needed moving from lying on your back to sitting on the side of a flat bed without using bedrails?: A Little Help needed moving to and from a bed to a chair (including a wheelchair)?: A Little Help needed standing up from a chair using your arms (e.g., wheelchair or bedside chair)?: A Little Help needed to walk in hospital room?: A Lot Help needed climbing 3-5 steps with a railing? : Total 6 Click Score: 15    End of Session Equipment Utilized During Treatment: Gait belt Activity Tolerance: Patient limited by fatigue Patient left: in bed;with call bell/phone within reach;with bed alarm set;with nursing/sitter in room Nurse Communication: Mobility status PT Visit Diagnosis: Other abnormalities of gait and mobility (R26.89);Muscle weakness (generalized) (M62.81);Difficulty in walking, not elsewhere classified (R26.2);Unsteadiness on feet (R26.81)     Time: 1030-1053 PT Time Calculation (min) (ACUTE ONLY): 23 min  Charges:  $Gait Training: 8-22 mins $Therapeutic Activity: 8-22 mins                    [employer], PT, GCS 06/11/21,12:02 PM

## 2021-06-11 NOTE — TOC Progression Note (Signed)
Transition of Care [facility]) - Progression Note    Patient Details  Name: Dana Coleman MRN: [id number] Date of Birth: 17-Dec-1940  Transition of Care [facility]) CM/SW [place], [place] Phone Number: 06/11/2021, 11:13 AM  Clinical Narrative:     [facility] declined- unable to meet patient needs Sent message to [facility]- requested confirm if they can meet patient's needs w/TPN Sent referral to [place], [facility] and [employer] CSW awaiting response.  Thurmond Butts, MSW, LCSW Clinical Social Worker    Expected Discharge Plan: Skilled Nursing Facility Barriers to Discharge: Continued Medical Work up, SNF Pending bed offer  Expected Discharge Plan and Services Expected Discharge Plan: [place] In-house Referral: Clinical Social Work     Living arrangements for the past 2 months: Single Family Home                                       Social Determinants of Health (SDOH) Interventions    Readmission Risk Interventions No flowsheet data found.

## 2021-06-11 NOTE — Progress Notes (Signed)
Instructed patient on procedure. HOB less than 45*. Pt held breath upon line removal with pressure held for 5 min with no s/sx of bleeding after line removed. Pressure drsg applied. Instructed pt to remain in bed for 71mn and keep drsg CDI for 24hr. VU. HFran [employer] RN VAST

## 2021-06-11 NOTE — Progress Notes (Signed)
FPTS Brief Progress Note  S Saw patient at bedside this evening. Patient was sleeping comfortably. I did not wake the patient.  O: BP 121/68 (BP Location: Right Arm)   Pulse 80   Temp 98.9 F (37.2 C) (Oral)   Resp 18   Ht '5\' 1"'$  (1.549 m)   Wt 74 kg   SpO2 96%   BMI 30.83 kg/m    General: sleeping comfortably  A/P: Plan per day team  - Orders reviewed. Labs for AM not ordered, which was adjusted as needed.   Lattie Haw, MD 06/11/2021, 8:03 PM PGY-3, [facility] Family Medicine Night Resident  Please page [phone] with questions.

## 2021-06-11 NOTE — Care Management Important Message (Signed)
Important Message  Patient Details  Name: Dana Coleman MRN: [id number] Date of Birth: October 13, 1941   Medicare Important Message Given:  Yes     Shelda Altes 06/11/2021, 9:53 AM

## 2021-06-11 NOTE — TOC Progression Note (Signed)
Transition of Care [facility]) - Progression Note    Patient Details  Name: Dana Coleman MRN: [id number] Date of Birth: [birth year]  Transition of Care [facility]) CM/SW [place], [place] Phone Number: 06/11/2021, 12:58 PM  Clinical Narrative:     Patient has no bed offer- placement barrier TPN  CSW contacted:  [facility]- they will review - awaiting response   Other SNFs: [facility], [facility], [place], [place], [facility]- all declined unable to meet patient's needs.   CSW will continue to follow and assist with discharge planning.   Thurmond Butts, MSW, LCSW Clinical Social Worker    Expected Discharge Plan: Skilled Nursing Facility Barriers to Discharge: Continued Medical Work up, SNF Pending bed offer  Expected Discharge Plan and Services Expected Discharge Plan: [place] In-house Referral: Clinical Social Work     Living arrangements for the past 2 months: Single Family Home                                       Social Determinants of Health (SDOH) Interventions    Readmission Risk Interventions No flowsheet data found.

## 2021-06-12 DIAGNOSIS — I2699 Other pulmonary embolism without acute cor pulmonale: Secondary | ICD-10-CM | POA: Diagnosis not present

## 2021-06-12 LAB — GLUCOSE, CAPILLARY
Glucose-Capillary: 181 mg/dL — ABNORMAL HIGH (ref 70–99)
Glucose-Capillary: 178 mg/dL — ABNORMAL HIGH (ref 70–99)
Glucose-Capillary: 174 mg/dL — ABNORMAL HIGH (ref 70–99)
Glucose-Capillary: 163 mg/dL — ABNORMAL HIGH (ref 70–99)

## 2021-06-12 MED ORDER — EMPAGLIFLOZIN 10 MG PO TABS
10.0000 mg | ORAL_TABLET | Freq: Every day | ORAL | Status: DC
  Administered 2021-06-12 – 2021-06-17 (×6): 10 mg via ORAL
  Filled 2021-06-12 (×6): qty 1

## 2021-06-12 MED ORDER — ROSUVASTATIN CALCIUM 20 MG PO TABS
20.0000 mg | ORAL_TABLET | Freq: Every day | ORAL | Status: DC
  Administered 2021-06-12 – 2021-06-13 (×2): 20 mg via ORAL
  Filled 2021-06-12 (×2): qty 1

## 2021-06-12 NOTE — Significant Event (Signed)
Patient needs 24-hour care and does not currently have a bed at a SNF.  I have attempted to call her sister to discuss if there is a possibility for her to live with family under 24-hour care. However, I was unable to reach the sister after attempting twice.  I will attempt again at another time.

## 2021-06-12 NOTE — TOC Progression Note (Signed)
Transition of Care [facility]) - Progression Note    Patient Details  Name: Dana Coleman MRN: [id number] Date of Birth: 08-Oct-1941  Transition of Care [facility]) CM/SW Contact  Vinie Sill, [place] Phone Number: 06/12/2021, 10:27 AM  Clinical Narrative:     [facility] declined- Patient has no bed offers.   Thurmond Butts, MSW, LCSW Clinical Social Worker    Expected Discharge Plan: Skilled Nursing Facility Barriers to Discharge: Continued Medical Work up, SNF Pending bed offer  Expected Discharge Plan and Services Expected Discharge Plan: [place] In-house Referral: Clinical Social Work     Living arrangements for the past 2 months: Single Family Home                                       Social Determinants of Health (SDOH) Interventions    Readmission Risk Interventions No flowsheet data found.

## 2021-06-12 NOTE — Progress Notes (Addendum)
Family Medicine Teaching Service Daily Progress Note Intern Pager: [phone]  Patient name: Dana Coleman Medical record number: [id number] Date of birth: 1941-02-15 Age: 81 y.o. Gender: female  Primary Care Provider: Gifford Shave, MD Consultants: GI, cardio, pulm, CVTS Code Status: Partial  Pt Overview and Major Events to Date:  7/23 admitted to ICU, intubated, right pigtail catheter placed 7/24 CRRT, emergent right thoracotomy, repair of esophageal perforation with intercostal pedicled muscle flap and drainage of empyema and decortication of right lung  7/25 NSTEMI, no intervention 7/26 Extubated, TPN started  8/1 Esophagram without leak, PO started, heparin gtt d/c because of increased bleeding from chest tubes. Transfused 1U pRBCs 8/3 Anterior chest tube removed  8/4 Remaining chest tube removed  8/8 increasing O2 requirement  8/9 Further increased O2 requirement, Afib 8/13 started Entresto, decreasing supplemental oxygen requirement  8/22 G-tube placed  Assessment and Plan:  Return in 2 months this 80 year old female admitted to F PTS following esophageal perforation status postrepair with NSTEMI on 7/25 for new onset A. fib and PE on 05/20/2021.  Past medical history significant for hypertension, cholecystectomy, DM2, breast cancer.  Routine calorie malnutrition/deconditioning status post G-tube placement Postop day 9 with no evidence of infection or obstruction at site of placement.  Nothing concerning on physical exam today and patient has remained afebrile since placement.  Vital signs of been stable -Dysphagia diet per SLP -Osmolite at 50 mL/hour -Current pain regimen: Oxycodone 2.5 mg every 6 hours as needed and -Tylenol 630 mg every 4 hours -Lidocaine patches as needed for pain near G-tube -Nutrition is following, appreciate recommendations -PIV in place -FiberCon added, d/c metamucil  A. fib Heart rate 82 and stable -Started Eliquis -Kohl's -metoprolol tartrate was changed to succinate  PE Patient has been satting well for the last several days on room air in the high 90s -On Eliquis  DM2 CBG today: 9 > 190 > 181  -On Lantus 5 units and sliding scale insulin, can start jardiance  Positive acid-fast bacilli from pleural fluid Follow-up with pulmonology  History of CAD -On home ASA 81 mg 1 time daily  Elevated LFTs, improving LFTs have been downtrending and are stable -Can start home statin   Abdominal Pain, resolved The pain regimen as stated above has been beneficial to the patient and she no longer has abdominal pain near her G-tube insertion.  Abdominal exams have been benign.  Hyperkalemia, resolved Potassium today is 4.8 and has been stable on the last several checks. -Holding Entresto until discharge  Stable for SNF Placement   FEN/GI: Dysphagia 2 diet  PPx: Eliquis  Dispo:Stable for SNF placement   Subjective:  Pt has no concerns at this time. Her abdominal pain has almost completely resolved and denies SOB.   Objective: Temp:  [97.9 F (36.6 C)-98.9 F (37.2 C)] 98.5 F (36.9 C) (08/31 0829) Pulse Rate:  [78-83] 81 (08/31 0829) Resp:  [17-20] 17 (08/31 0829) BP: (113-123)/(67-79) 113/67 (08/31 0829) SpO2:  [95 %-98 %] 98 % (08/31 0829) Weight:  [76.9 kg] 76.9 kg (08/31 0413) Physical Exam Vitals reviewed.  Constitutional:      Appearance: Normal appearance. She is not ill-appearing.  Cardiovascular:     Rate and Rhythm: Normal rate and regular rhythm.     Heart sounds: No murmur heard. Pulmonary:     Effort: Pulmonary effort is normal.     Breath sounds: Normal breath sounds.  Abdominal:     Palpations: Abdomen is soft.  Comments: G-Tube in place and dressing covering, clean and dry   Neurological:     Mental Status: She is alert.    Laboratory: Recent Labs  Lab 06/06/21 0614 06/09/21 0600  WBC 7.7 6.1  HGB 9.9* 9.5*  HCT 31.0* 30.2*  PLT 211 229   Recent Labs   Lab 06/08/21 0500 06/09/21 0600 06/10/21 0452  NA 136 136 136  K 5.0 4.9 4.8  CL 105 106 103  CO2 '26 27 27  '$ BUN '17 17 15  '$ CREATININE 0.58 0.61 0.59  CALCIUM 8.6* 8.7* 8.9  PROT 5.8* 5.8* 5.7*  BILITOT 0.5 0.2* 0.3  ALKPHOS 82 82 73  ALT 72* 63* 52*  AST 69* 52* 39  GLUCOSE 232* 218* 204*     Erskine Emery, MD 06/12/2021, 11:31 AM PGY-1, [place] Intern pager: [phone], text pages welcome

## 2021-06-12 NOTE — TOC Progression Note (Signed)
Transition of Care [facility]) - Progression Note    Patient Details  Name: Dana Coleman MRN: [id number] Date of Birth: Nov 22, 1940  Transition of Care [facility]) CM/SW [place], [place] Phone Number: 06/12/2021, 2:20 PM  Clinical Narrative:     CSW re-sent SNF referrals for reconsideration , informed no longer on TPN- has Peg tube - CSW awaiting determination Josem Kaufmann has been started- CSW will need to contact insurance and provide SNF choice of bed is secured.   Thurmond Butts, MSW, LCSW Clinical Social Worker    Expected Discharge Plan: Skilled Nursing Facility Barriers to Discharge: Continued Medical Work up, SNF Pending bed offer  Expected Discharge Plan and Services Expected Discharge Plan: [place] In-house Referral: Clinical Social Work     Living arrangements for the past 2 months: Single Family Home                                       Social Determinants of Health (SDOH) Interventions    Readmission Risk Interventions No flowsheet data found.

## 2021-06-12 NOTE — NC FL2 (Addendum)
[place] LEVEL OF CARE SCREENING TOOL     IDENTIFICATION  Patient Name: Dana Coleman Birthdate: [birth year] Sex: female Admission Date (Current Location): 05/04/2021  [facility] and [place] Number:  Herbalist and Address:  [facility] [place], [place] [address] [place], [place] [address]      Provider Number: [id number]  Attending Physician Name and Address:  Martyn Malay, MD  Relative Name and Phone Number:       Current Level of Care: Hospital Recommended Level of Care: [place] Prior Approval Number:    Date Approved/Denied:   PASRR Number: [id number]  Discharge Plan: SNF    Current Diagnoses: Patient Active Problem List   Diagnosis Date Noted   On total parenteral nutrition (TPN) 05/25/2021   Impaired functional mobility, balance, gait, and endurance 05/25/2021   S/P thoracotomy    NSTEMI (non-ST elevated myocardial infarction) ([place]) 05/23/2021   Heart failure with reduced ejection fraction ([place]) 05/23/2021   Empyema ([place])    Acute pulmonary embolism ([place])    Acute respiratory failure with hypoxemia ([place])    Acute pulmonary edema ([place])    Acute respiratory failure with hypoxia ([place])    Acute renal failure (HCC)    Hydropneumothorax    Protein-calorie malnutrition, severe 05/15/2021   Pressure injury of skin 05/05/2021   Esophageal perforation 05/05/2021   Vaginal itching 03/25/2021   Left hip pain 02/12/2021   GERD (gastroesophageal reflux disease) 12/19/2020   Neck muscle spasm 02/16/2019   Breast lump 12/07/2018   Plantar fasciitis, right 09/30/2018   Plantar wart 11/17/2017   Insulin dependent type 2 diabetes mellitus, uncontrolled ([place]) 08/14/2014   Debility 06/30/2011   History of thyroid nodule 01/16/2011   History of breast cancer (right)  stage 1 ERA positive    History of cervical cancer    Hyperlipidemia    Hypertension    CAD (coronary artery disease), native coronary artery     Osteopenia 12/10/2006    Orientation RESPIRATION BLADDER Height & Weight     Self, Time, Situation, Place  Normal External catheter, Incontinent Weight: 169 lb 8.5 oz (76.9 kg) Height:  '5\' 1"'$  (154.9 cm)  BEHAVIORAL SYMPTOMS/MOOD NEUROLOGICAL BOWEL NUTRITION STATUS      Incontinent Diet (please see discharge summary) Peg- tube placed 06/03/2021, TPN STOPPED on 06/05/2021  AMBULATORY STATUS COMMUNICATION OF NEEDS Skin   Supervision Verbally Surgical wounds (Surgical wounds (wound/incision skin tear,bakc lateral RT, Pressure injury sacrum nedial stage I, pressure injury buttocks left, stage II, closed incision right,abdomen.))                       Personal Care Assistance Level of Assistance  Bathing, Feeding, Dressing Bathing Assistance: Maximum assistance Feeding assistance: Limited assistance Dressing Assistance: Maximum assistance     Functional Limitations Info  Sight, Hearing, Speech Sight Info: Adequate Hearing Info: Adequate Speech Info: Adequate    SPECIAL CARE FACTORS FREQUENCY  PT (By licensed PT), OT (By licensed OT)     PT Frequency: 5x per week OT Frequency: 5x per week            Contractures Contractures Info: Not present    Additional Factors Info  Code Status Code Status Info: Partial Allergies Info: NKA           Current Medications (06/12/2021):  This is the current hospital active medication list Current Facility-Administered Medications  Medication Dose Route Frequency Provider Last Rate Last Admin  0.9 %  sodium chloride infusion   Intravenous PRN Candee Furbish, MD 10 mL/hr at 05/26/21 1500 Infusion Verify at 05/26/21 1500   acetaminophen (TYLENOL) tablet 650 mg  650 mg Per Tube Q8H Martyn Malay, MD   650 mg at 06/12/21 1342   apixaban (ELIQUIS) tablet 5 mg  5 mg Per Tube BID Sharion Settler, DO   5 mg at 06/12/21 [id number]   aspirin EC tablet 81 mg  81 mg Oral Daily Simmons-Robinson, Makiera, MD   81 mg at 06/12/21 0850    Chlorhexidine Gluconate Cloth 2 % PADS 6 each  6 each Topical Daily Candee Furbish, MD   6 each at 06/12/21 0850   empagliflozin (JARDIANCE) tablet 10 mg  10 mg Oral Daily Martyn Malay, MD   10 mg at 06/12/21 1343   feeding supplement (OSMOLITE 1.5 CAL) liquid 1,000 mL  1,000 mL Per Tube Continuous Lind Covert, MD 50 mL/hr at 06/11/21 1614 1,000 mL at 06/11/21 1614   feeding supplement (PROSource TF) liquid 45 mL  45 mL Per Tube BID Lind Covert, MD   45 mL at 06/12/21 0849   Gerhardt's butt cream 1 application  1 application Topical QID Icard, Bradley L, DO   1 application at [id number]   insulin aspart (novoLOG) injection 0-9 Units  0-9 Units Subcutaneous Q8H Simmons-Robinson, Makiera, MD   2 Units at 06/12/21 0651   insulin glargine-yfgn (SEMGLEE) injection 5 Units  5 Units Subcutaneous Daily Simmons-Robinson, Makiera, MD   5 Units at 06/12/21 0850   lidocaine (LIDODERM) 5 % 1 patch  1 patch Transdermal Q24H Hammons, Kimberly B, RPH   1 patch at 06/11/21 1609   melatonin tablet 3 mg  3 mg Per Tube QHS Sharion Settler, DO   3 mg at 06/11/21 2215   metoprolol succinate (TOPROL-XL) 24 hr tablet 50 mg  50 mg Oral Daily Espinoza, Alejandra, DO   50 mg at 06/12/21 0850   ondansetron (ZOFRAN) injection 4 mg  4 mg Intravenous Q6H PRN Martyn Malay, MD   4 mg at 06/11/21 1013   oxyCODONE (Oxy IR/ROXICODONE) immediate release tablet 2.5 mg  2.5 mg Oral Q6H PRN Simmons-Robinson, Makiera, MD   2.5 mg at 06/12/21 0849   polycarbophil (FIBERCON) tablet 625 mg  625 mg Oral Daily Erskine Emery, MD   625 mg at 06/12/21 0849   rosuvastatin (CRESTOR) tablet 20 mg  20 mg Oral Daily Martyn Malay, MD   20 mg at 06/12/21 1342   sodium chloride (OCEAN) 0.65 % nasal spray 2 spray  2 spray Each Nare PRN Orvis Brill, DO   2 spray at 05/19/21 2130   sodium chloride flush (NS) 0.9 % injection 10-40 mL  10-40 mL Intracatheter Q12H Candee Furbish, MD   3 mL at 06/12/21 0851   sodium  chloride flush (NS) 0.9 % injection 10-40 mL  10-40 mL Intracatheter PRN Candee Furbish, MD   40 mL at 05/27/21 0411     Discharge Medications: Please see discharge summary for a list of discharge medications.  Relevant Imaging Results:  Relevant Lab Results:   Additional Information SSN [id number] [place] COVID-19 Vaccine 07/28/2020 , 11/24/2019 , 11/03/2019  & 03/25/2021  Vinie Sill, LCSW

## 2021-06-12 NOTE — Progress Notes (Signed)
Mobility Specialist: Progress Note   06/12/21 1151  Mobility  Activity Ambulated in room  Level of Assistance Minimal assist, patient does 75% or more  Assistive Device Front wheel walker  Distance Ambulated (ft) 20 ft  Mobility Ambulated with assistance in room  Mobility Response Tolerated well  Mobility performed by Mobility specialist  Bed Position Chair  $Mobility charge 1 Mobility   Pre-Mobility: 82 HR, 127/79 BP, 98% SpO2 Post-Mobility: 80 HR, 96% SpO2  Pt required minA to stand from chair and contact guard during ambulation. Pt asx throughout. Pt sitting in the chair at the sink with NT present in the room.   [facility] Dana Coleman Mobility Specialist Mobility Specialist Phone: [phone]

## 2021-06-12 NOTE — Progress Notes (Signed)
FPTS Brief Progress Note  S Saw patient at bedside this evening. Patient was sleeping comfortably. I did not wake the patient.  O: BP 126/77 (BP Location: Right Arm)   Pulse 78   Temp 99.1 F (37.3 C) (Oral)   Resp 20   Ht '5\' 1"'$  (1.549 m)   Wt 76.9 kg   SpO2 98%   BMI 32.03 kg/m    General: sleeping comfortably   A/P: Plan per day team  -Pending SNF placement  -Orders reviewed. Labs for AM , which was adjusted as needed.   Lattie Haw, MD 06/12/2021, 9:18 PM PGY-3, [place] Family Medicine Night Resident  Please page [phone] with questions.

## 2021-06-12 NOTE — Progress Notes (Signed)
Occupational Therapy Treatment Patient Details Name: Dana Coleman MRN: [id number] DOB: 1941/04/04 Today's Date: 06/12/2021    History of present illness Pt is a 80 yo female admitted on 05/04/21 for AMS. HX of cholecystectomy on 7/21.  Intubated upon admission, 7/24 Started CRRT, Esophageal perforation with septic shock post flap repair, drainage of empyema and decortication of R lung with chest tube placed.  7/25 EKG changes and probable nstemi. Extubated 7/26. CT removed 8/4. Pt with new PE with R heart strain and increased O2 HFNC requirements 8/8. G-tube placed 8/22. PMH includes: R breast CA, DM, CAD, HTN.   OT comments  Pt with gradual progress toward OT goals. Pt benefits from encouragement for progression of OOB activities due to anxiety with movement. Pt overall Min A at most for transfers to/from [facility] using RW. Pt requires Min A for UB ADLs and Mod A for LB ADLs assessed today. Continues to require increased assist for LB ADLs due to abdominal pain and endurance deficits. Continue to rec SNF rehab at [place].    Follow Up Recommendations  SNF;Supervision/Assistance - 24 hour    Equipment Recommendations  3 in 1 bedside commode    Recommendations for Other Services      Precautions / Restrictions Precautions Precautions: Fall Precaution Comments: watch HR/RR, G tube Restrictions Weight Bearing Restrictions: No       Mobility Bed Mobility Overal bed mobility: Needs Assistance Bed Mobility: Rolling;Sidelying to Sit Rolling: Min assist Sidelying to sit: Min assist       General bed mobility comments: Min A for log rolling to EOB, cues for bed rail use and technique. HOB 30*    Transfers Overall transfer level: Needs assistance Equipment used: Rolling walker (2 wheeled) Transfers: Sit to/from [facility] Sit to Stand: Min assist Stand pivot transfers: Min guard       General transfer comment: Min A for initial power up from bedside, progressing to  min guard from [facility]. Min guard for transfers > BSC > chair with cues for sequencing. focused on reinforcing hand placement for transfers    Balance Overall balance assessment: Needs assistance Sitting-balance support: Feet supported Sitting balance-Leahy Scale: Good     Standing balance support: Bilateral upper extremity supported;During functional activity Standing balance-Leahy Scale: Poor Standing balance comment: reliant on at least one UE support in standing                           ADL either performed or assessed with clinical judgement   ADL Overall ADL's : Needs assistance/impaired         Upper Body Bathing: Minimal assistance;Sitting Upper Body Bathing Details (indicate cue type and reason): Assist for back/shoulders, fatigues with task Lower Body Bathing: Moderate assistance;Sit to/from stand Lower Body Bathing Details (indicate cue type and reason): Assist with lower LEs/bottom in standing after toileting tasks Upper Body Dressing : Minimal assistance;Sitting       Toilet Transfer: Minimal assistance;Stand-pivot;BSC;RW Toilet Transfer Details (indicate cue type and reason): Min A for power up initially but able to take steps to/from [facility] using RW Toileting- Clothing Manipulation and Hygiene: Maximal assistance;Sit to/from stand Toileting - Clothing Manipulation Details (indicate cue type and reason): assist for peri care after toileting task on [facility]       General ADL Comments: Continues to be limited by decreased endurance and anxiety with activity. Able to progress transfers though continues to require extensive assist for LB ADls due to G  tube pain and poor endurance     Vision   Vision Assessment?: No apparent visual deficits   Perception     Praxis      Cognition Arousal/Alertness: Awake/alert Behavior During Therapy: WFL for tasks assessed/performed Overall Cognitive Status: Within Functional Limits for tasks assessed                                  General Comments: anxious with movement and exertion. WFL cognitively        Exercises     Shoulder Instructions       General Comments SpO2 96% on RA, HR 80s    Pertinent Vitals/ Pain       Pain Assessment: Faces Faces Pain Scale: Hurts little more Pain Location: L abdomen at G-tube site Pain Descriptors / Indicators: Grimacing;Guarding;Sore Pain Intervention(s): Monitored during session;Premedicated before session;Limited activity within patient's tolerance  Home Living                                          Prior Functioning/Environment              Frequency  Min 2X/week        Progress Toward Goals  OT Goals(current goals can now be found in the care plan section)  Progress towards OT goals: Progressing toward goals  Acute Rehab OT Goals Patient Stated Goal: feel better OT Goal Formulation: With patient Time For Goal Achievement: 06/24/21 Potential to Achieve Goals: Fair ADL Goals Pt Will Perform Grooming: with min guard assist;standing Pt Will Perform Lower Body Dressing: with mod assist;sitting/lateral leans;with adaptive equipment;sit to/from stand Pt Will Transfer to Toilet: with supervision;stand pivot transfer;bedside commode;ambulating Pt Will Perform Toileting - Clothing Manipulation and hygiene: with min assist;sit to/from stand Pt/caregiver will Perform Home Exercise Program: Increased strength;Both right and left upper extremity;With theraband;With written HEP provided  Plan Discharge plan remains appropriate    Co-evaluation                 AM-PAC OT "6 Clicks" Daily Activity     Outcome Measure   Help from another person eating meals?: A Little Help from another person taking care of personal grooming?: A Little Help from another person toileting, which includes using toliet, bedpan, or urinal?: A Lot Help from another person bathing (including washing, rinsing, drying)?: A Lot Help  from another person to put on and taking off regular upper body clothing?: A Little Help from another person to put on and taking off regular lower body clothing?: Total 6 Click Score: 14    End of Session Equipment Utilized During Treatment: Rolling walker  OT Visit Diagnosis: Other abnormalities of gait and mobility (R26.89);Muscle weakness (generalized) (M62.81);Pain;Other symptoms and signs involving cognitive function Pain - part of body:  (abdomen)   Activity Tolerance Patient tolerated treatment well   Patient Left in chair;with call bell/phone within reach   Nurse Communication Mobility status        Time: 0927-1002 OT Time Calculation (min): 35 min  Charges: OT General Charges $OT Visit: 1 Visit OT Treatments $Self Care/Home Management : 23-37 mins  Malachy Chamber, OTR/L Acute Rehab Services Office: [phone]    Layla Maw 06/12/2021, 10:26 AM

## 2021-06-13 DIAGNOSIS — R109 Unspecified abdominal pain: Secondary | ICD-10-CM

## 2021-06-13 DIAGNOSIS — K223 Perforation of esophagus: Secondary | ICD-10-CM | POA: Diagnosis not present

## 2021-06-13 DIAGNOSIS — R0609 Other forms of dyspnea: Secondary | ICD-10-CM

## 2021-06-13 DIAGNOSIS — R0600 Dyspnea, unspecified: Secondary | ICD-10-CM

## 2021-06-13 LAB — GLUCOSE, CAPILLARY
Glucose-Capillary: 190 mg/dL — ABNORMAL HIGH (ref 70–99)
Glucose-Capillary: 164 mg/dL — ABNORMAL HIGH (ref 70–99)

## 2021-06-13 LAB — BASIC METABOLIC PANEL
Anion gap: 7 (ref 5–15)
BUN: 22 mg/dL (ref 8–23)
CO2: 26 mmol/L (ref 22–32)
Calcium: 8.9 mg/dL (ref 8.9–10.3)
Chloride: 104 mmol/L (ref 98–111)
Creatinine, Ser: 0.67 mg/dL (ref 0.44–1.00)
GFR, Estimated: >60 mL/min (ref >60)
Glucose, Bld: 198 mg/dL — ABNORMAL HIGH (ref 70–99)
Potassium: 4.6 mmol/L (ref 3.5–5.1)
Sodium: 137 mmol/L (ref 135–145)

## 2021-06-13 LAB — CBC
HCT: 32 % — ABNORMAL LOW (ref 36.0–46.0)
Hemoglobin: 9.9 g/dL — ABNORMAL LOW (ref 12.0–15.0)
MCH: 29.1 pg (ref 26.0–34.0)
MCHC: 30.9 g/dL (ref 30.0–36.0)
MCV: 94.1 fL (ref 80.0–100.0)
Platelets: 276 10*3/uL (ref 150–400)
RBC: 3.4 MIL/uL — ABNORMAL LOW (ref 3.87–5.11)
RDW: 17.2 % — ABNORMAL HIGH (ref 11.5–15.5)
WBC: 6.6 10*3/uL (ref 4.0–10.5)
nRBC: 0 % (ref 0.0–0.2)

## 2021-06-13 LAB — HEPATIC FUNCTION PANEL
ALT: 52 U/L — ABNORMAL HIGH (ref 0–44)
AST: 44 U/L — ABNORMAL HIGH (ref 15–41)
Albumin: 1.9 g/dL — ABNORMAL LOW (ref 3.5–5.0)
Alkaline Phosphatase: 82 U/L (ref 38–126)
Bilirubin, Direct: <0.1 mg/dL (ref 0.0–0.2)
Total Bilirubin: 0.3 mg/dL (ref 0.3–1.2)
Total Protein: 6.1 g/dL — ABNORMAL LOW (ref 6.5–8.1)

## 2021-06-13 MED ORDER — FUROSEMIDE 20 MG PO TABS
20.0000 mg | ORAL_TABLET | Freq: Once | ORAL | Status: AC
  Administered 2021-06-13: 20 mg via ORAL
  Filled 2021-06-13: qty 1

## 2021-06-13 MED ORDER — METFORMIN HCL ER 500 MG PO TB24
500.0000 mg | ORAL_TABLET | Freq: Two times a day (BID) | ORAL | Status: DC
  Administered 2021-06-13 (×2): 500 mg via ORAL
  Filled 2021-06-13 (×2): qty 1

## 2021-06-13 NOTE — TOC Progression Note (Signed)
Transition of Care [facility]) - Progression Note    Patient Details  Name: Dana Coleman MRN: [id number] Date of Birth: 31-May-1941  Transition of Care [facility]) CM/SW [place], [place] Phone Number: 06/13/2021, 11:18 AM  Clinical Narrative:   CSW asked [facility] to review referral. [facility] has no bed available today, but they will review referral when able and will get back to CSW. CSW to follow.    Expected Discharge Plan: [place] Barriers to Discharge: Continued Medical Work up, SNF Pending bed offer  Expected Discharge Plan and Services Expected Discharge Plan: [place] In-house Referral: Clinical Social Work     Living arrangements for the past 2 months: Single Family Home                                       Social Determinants of Health (SDOH) Interventions    Readmission Risk Interventions No flowsheet data found.

## 2021-06-13 NOTE — Progress Notes (Addendum)
Family Medicine Teaching Service Daily Progress Note Intern Pager: [phone]  Patient name: Dana Coleman Medical record number: [id number] Date of birth: [birth year] Age: 80 y.o. Gender: female  Primary Care Provider: Gifford Shave, MD Consultants: Cardio, pulm, CVTS, GI (All signed off) Code Status: Partial  Pt Overview and Major Events to Date:  7/23 admitted to ICU, intubated, right pigtail catheter placed 7/24 CRRT, emergent right thoracotomy, repair of esophageal perforation with intercostal pedicled muscle flap and drainage of empyema and decortication of right lung  7/25 NSTEMI, no intervention 7/26 Extubated, TPN started  8/1 Esophagram without leak, PO started, heparin gtt d/c because of increased bleeding from chest tubes. Transfused 1U pRBCs 8/3 Anterior chest tube removed  8/4 Remaining chest tube removed  8/8 increasing O2 requirement  8/9 Further increased O2 requirement, Afib 8/13 started Entresto, decreasing supplemental oxygen requirement  8/22 G-tube placed  Assessment and Plan:  Dana Coleman is a 80 year old female who was admitted to [facility] following an esophageal perforation status postrepair with NSTEMI on 7/25 for new onset A. fib and PE on 05/20/2021.  Past medical history relevant for hypertension, cholecystectomy, DM2, and breast cancer.  Routine calorie malnutrition/deconditioning status post G-tube placement Postop day a little bit with no evidence of infection or obstruction at site of placement.  Patient has severe deconditioning due to a prolonged hospital stay with multiple complications and acute illnesses.  Current pain regimen is oxycodone 2.5 mg every 6 hours as needed and Tylenol 630 mg every 4 hours scheduled.  This seems to help alleviate her pain and she does not need much oxycodone. Pt denies any new pain today.  -Continue with pain control as stated above  -Osmolite '@50ml'$ /hour  -Dysphagia diet per SLP -Lidocaine patches as needed  near G-tube for pain -Nutrition is following, appreciate recommendations -Per nutrition, will attempt nocturnal feeds to prevent nausea and vomiting and for pt comfort, appreciate their care of patient  -On Fibercon-will increase to help BM  HFrEF  weight change  Newest echo shows ejection fraction of 25 to 30% with mild left ventricular hypertrophy and severely decreased function.  Previous echo showed an EF closer to 40% so this is a new finding.  She has gone up 40 pounds since admission. No evidence of volume overload on physical exam.  Output: 2L. BMP: electrolytes within normal limits, calcium 8.8 (unable to get corrected calcium). Current weight that has been recorded is not reliable from 169 to 144 in two days. Will get repeat weight for today.  -Status post Lasix 20 mg p.o. x1 -does not need more lasix at this point unless there is major change in updated weight  -Daily weights -I's and O's  Diarrhea  Increased BM Pt was just placed back on metformin which is likely the cause of her increased bowel movements. We will change her metformin to be taken with meals. We have also increased fibercon and d/c metamucil because the pt did not like taking the metamucil.   Chronic Conditions that are stable:   A. Fib  Heart rate has been stable for several days.  Currently heart rate is 83 - Started Eliquis - Holding Entresto due to previous elevated potassium  - Metoprolol tartrate was changed to succinate-'50mg'$  once daily   PE Patient has been satting in the high 90s for the last several days on room air without complications - On Eliquis  DM2  CBG today:164>130>191 - On Semglee 5 units and sliding scale insulin.  We have  started Jardiance and metformin as well.  These are her home doses. -Metformin 500 mg twice daily and Jardiance 10 mg daily -Continue to monitor  Positive acid-fast bacilli from pleural fluid  We will run this by pulmonology  Hyperkalemia, resolved Potassium has  remained in normal limits and currently we are holding Entresto until discharge  Stable for SNF placement   FEN/GI: Dysphagia 2 diet PPx: Eliquis Dispo: SNF placement and is stable today. Awaiting placement   Subjective:  Patient reports that she is having increased bowel movements that are runny.  Her abdominal pain is well controlled.  Denies any chest pain or shortness of breath.  Objective: Temp:  [98 F (36.7 C)-98.7 F (37.1 C)] 98.2 F (36.8 C) (09/02 0736) Pulse Rate:  [79-83] 83 (09/02 0736) Resp:  [16-25] 20 (09/02 0737) BP: (122-130)/(65-73) 122/73 (09/02 0736) SpO2:  [98 %-99 %] 98 % (09/02 0736) Weight:  [64.7 kg-65.6 kg] 64.7 kg (09/02 1000) Physical Exam Constitutional:      General: She is not in acute distress.    Appearance: Normal appearance. She is not ill-appearing.  Cardiovascular:     Rate and Rhythm: Normal rate and regular rhythm.  Pulmonary:     Effort: Pulmonary effort is normal.     Breath sounds: Normal breath sounds.     Comments: No crackles evident.   Abdominal:     General: Bowel sounds are normal. There is no distension.     Palpations: Abdomen is soft.     Tenderness: There is abdominal tenderness (Minimal around G-Tube site).  Musculoskeletal:     Comments: No evidence of pitting edema on exam   Skin:    General: Skin is warm and dry.     Comments: G-tube in place with dressing clean and dry  Neurological:     Mental Status: She is alert.  Psychiatric:        Mood and Affect: Mood normal.        Behavior: Behavior normal.    Laboratory: Recent Labs  Lab 06/09/21 0600 06/13/21 0130  WBC 6.1 6.6  HGB 9.5* 9.9*  HCT 30.2* 32.0*  PLT 229 276   Recent Labs  Lab 06/09/21 0600 06/10/21 0452 06/13/21 0130 06/14/21 0142  NA 136 136 137 136  K 4.9 4.8 4.6 4.6  CL 106 103 104 103  CO2 '27 27 26 26  '$ BUN '17 15 22 20  '$ CREATININE 0.61 0.59 0.67 0.71  CALCIUM 8.7* 8.9 8.9 8.8*  PROT 5.8* 5.7* 6.1*  --   BILITOT 0.2* 0.3 0.3   --   ALKPHOS 82 73 82  --   ALT 63* 52* 52*  --   AST 52* 39 44*  --   GLUCOSE 218* 204* 198* 142*      Erskine Emery, MD 06/14/2021, 11:55 AM PGY-1, [place] Intern pager: [phone], text pages welcome

## 2021-06-13 NOTE — Progress Notes (Addendum)
FPTS Interim Progress Note  S: Patient resting comfortably in bed. No questions or complaints that patient had at the time. Attests to urinating frequently after Lasix dose today and had BM.  O: BP 130/67 (BP Location: Right Arm)   Pulse 80   Temp 98 F (36.7 C) (Oral)   Resp 20   Ht '5\' 1"'$  (1.549 m)   Wt 76.9 kg   SpO2 98%   BMI 32.03 kg/m    General: Well appearing, resting in bed Lungs: Clear to auscultation bilaterally, no increased work in breathing Heart: RRR no m/r/g Abdominal: non distended  A/P: Plan per day team -awaiting SNF placement -s/p lasix x1 this AM for weight gain of 40 lbs since admission, diuresing well -will monitor vitals overnight  Gerrit Heck, MD 06/13/2021, 9:26 PM PGY-1, [place] Medicine Service pager [phone]

## 2021-06-13 NOTE — Progress Notes (Signed)
Mobility Specialist: Progress Note   06/13/21 1751  Mobility  Activity Turned to right side;Turned to left side  Level of Assistance Moderate assist, patient does 50-74%  Assistive Device None  Mobility Sit up in bed/chair position for meals  Mobility Response Tolerated fair  Mobility performed by Mobility specialist;Nurse tech  $Mobility charge 1 Mobility   Pre-Mobility: 85 HR, 131/74 BP, 99% SpO2  Attempted to ambulate pt but pt found bowel incontinent. Pt assisted with pericare with help from NT. Pt required modA to turn to each side for bed mobility. Pt declining further mobility after being cleaned up. NT present in the room.   [facility] Shaya Altamura Mobility Specialist Mobility Specialist Phone: [phone]

## 2021-06-13 NOTE — Progress Notes (Addendum)
Family Medicine Teaching Service Daily Progress Note Intern Pager: [phone]  Patient name: Dana Coleman Medical record number: [id number] Date of birth: Nov 13, 1940 Age: 80 y.o. Gender: female  Primary Care Provider: Gifford Shave, MD Consultants: GI, cardio, pulm, CVTS Code Status: Partial   Pt Overview and Major Events to Date:  7/23 admitted to ICU, intubated, right pigtail catheter placed 7/24 CRRT, emergent right thoracotomy, repair of esophageal perforation with intercostal pedicled muscle flap and drainage of empyema and decortication of right lung  7/25 NSTEMI, no intervention 7/26 Extubated, TPN started  8/1 Esophagram without leak, PO started, heparin gtt d/c because of increased bleeding from chest tubes. Transfused 1U pRBCs 8/3 Anterior chest tube removed  8/4 Remaining chest tube removed  8/8 increasing O2 requirement  8/9 Further increased O2 requirement, Afib 8/13 started Entresto, decreasing supplemental oxygen requirement  8/22 G-tube placed  Assessment and Plan:  80 year old female admitted to [place] following esophageal perforation status post repair with NSTEMI on 7/25 for new onset Afib and PE on 05/20/21. PMH HTN, cholecystectomy, DM2, breast cancer   Routine calorie malnutrition/deconditioning  s/p G-tube placement  Post op day 10 with no evidence of infection or obstruction at site of placement.  She denies any pain this morning.  Patient is severely deconditioned due to prolonged hospital stay with multiple acute illnesses.  Current pain regimen: Oxycodone 2.5 mg every 6 hours as needed and Tylenol 630 mg every 4 hours.  Received 1 dose of Oxy over the last day. -Continue with pain control -Dysphagia diet per SLP -Osmolite at 50 mL/h -Lidocaine patches as needed for pain your G-tube site -Nutrition is following, appreciate recommendations -PIV in place without PICC -FiberCon has been added  A. Fib Heart rate 77 and stable -Started Eliquis -Holding  Entresto until outpatient -On metoprolol succinate  PE Patient has been satting well on room air in the high 90s -On Eliquis  DM2 CBGs 190 > 163 > 174 -We have restarted Jardiance and metformin 500 BID -sSSI, Semglee 5U daily  Positive acid-fast bacilli from pleural fluid  We will follow-up with pulmonology  History of CAD On home ASA 81 mg 1 times daily  Elevated LFTs improving LFTs have been downtrending and are stable -Started home statin  Abdominal pain resolved Patient has been benefiting from the pain regimen that she has received here and no longer has abdominal pain.  All serial exams have been fairly benign  Hyperkalemia, resolved  potassium has remained in normal limits and currently we are holding Entresto until discharge  HFrEF  Weight Change  Echo: EF 25-30% mild LVH with severely decreased function. This is new from previous echo. Pt has gone up 40lbs since admission. Outpt 1L.  -We will give one dose of PO Lasix '20mg'$  and re-evaluate for the need of additional lasix   -Daily Weights  -I/Os  Stable for SNF placement  FEN/GI: Dysphagia 2 diet PPx: Eliquis Dispo: Currently stable for placement and waiting for a bed  Subjective:  Patient denies any complaints at this time.   Objective: Temp:  [98 F (36.7 C)-99.1 F (37.3 C)] 98.6 F (37 C) (09/01 0733) Pulse Rate:  [72-84] 72 (09/01 0950) Resp:  [17-29] 20 (09/01 0733) BP: (114-127)/(72-80) 114/72 (09/01 0950) SpO2:  [97 %-99 %] 97 % (09/01 0733) Physical Exam Vitals reviewed.  Constitutional:      General: She is not in acute distress.    Appearance: She is not ill-appearing.     Comments: Pt  is drowsy and resting in bed  Cardiovascular:     Rate and Rhythm: Normal rate and regular rhythm.     Heart sounds: No murmur heard. Pulmonary:     Effort: Pulmonary effort is normal.     Breath sounds: Normal breath sounds.  Abdominal:     General: There is no distension.     Palpations: Abdomen is  soft.     Tenderness: There is abdominal tenderness (Some tenderness in LUQ around G-tube site).     Comments: G-tube in place with dressing clean and dry  Skin:    General: Skin is warm and dry.    Laboratory: Recent Labs  Lab 06/09/21 0600 06/13/21 0130  WBC 6.1 6.6  HGB 9.5* 9.9*  HCT 30.2* 32.0*  PLT 229 276   Recent Labs  Lab 06/09/21 0600 06/10/21 0452 06/13/21 0130  NA 136 136 137  K 4.9 4.8 4.6  CL 106 103 104  CO2 '27 27 26  '$ BUN '17 15 22  '$ CREATININE 0.61 0.59 0.67  CALCIUM 8.7* 8.9 8.9  PROT 5.8* 5.7* 6.1*  BILITOT 0.2* 0.3 0.3  ALKPHOS 82 73 82  ALT 63* 52* 52*  AST 52* 39 44*  GLUCOSE 218* 204* 198*      Erskine Emery, MD 06/13/2021, 11:00 AM PGY-1, [place] Intern pager: [phone], text pages welcome

## 2021-06-13 NOTE — Progress Notes (Signed)
PT Cancellation Note  Patient Details Name: Dana Coleman MRN: [id number] DOB: 01/29/1941   Cancelled Treatment:    Reason Eval/Treat Not Completed: Other (comment) Pt just starting to work with mobility tech, will f/u at later date. Abran Richard, PT Acute Rehab Services Pager [phone] [facility] [place] 06/13/2021, 5:39 PM

## 2021-06-14 DIAGNOSIS — K223 Perforation of esophagus: Secondary | ICD-10-CM | POA: Diagnosis not present

## 2021-06-14 LAB — BASIC METABOLIC PANEL
Anion gap: 7 (ref 5–15)
BUN: 20 mg/dL (ref 8–23)
CO2: 26 mmol/L (ref 22–32)
Calcium: 8.8 mg/dL — ABNORMAL LOW (ref 8.9–10.3)
Chloride: 103 mmol/L (ref 98–111)
Creatinine, Ser: 0.71 mg/dL (ref 0.44–1.00)
GFR, Estimated: >60 mL/min (ref >60)
Glucose, Bld: 142 mg/dL — ABNORMAL HIGH (ref 70–99)
Potassium: 4.6 mmol/L (ref 3.5–5.1)
Sodium: 136 mmol/L (ref 135–145)

## 2021-06-14 LAB — GLUCOSE, CAPILLARY
Glucose-Capillary: 130 mg/dL — ABNORMAL HIGH (ref 70–99)
Glucose-Capillary: 191 mg/dL — ABNORMAL HIGH (ref 70–99)
Glucose-Capillary: 154 mg/dL — ABNORMAL HIGH (ref 70–99)
Glucose-Capillary: 141 mg/dL — ABNORMAL HIGH (ref 70–99)

## 2021-06-14 MED ORDER — METOPROLOL TARTRATE 25 MG PO TABS: 25.0000 mg | ORAL_TABLET | Freq: Two times a day (BID) | ORAL | Status: DC

## 2021-06-14 MED ORDER — CALCIUM POLYCARBOPHIL 625 MG PO TABS
625.0000 mg | ORAL_TABLET | Freq: Three times a day (TID) | ORAL | Status: DC
  Administered 2021-06-14 – 2021-06-15 (×6): 625 mg via ORAL
  Filled 2021-06-14 (×6): qty 1

## 2021-06-14 MED ORDER — METOPROLOL TARTRATE 25 MG PO TABS
25.0000 mg | ORAL_TABLET | Freq: Two times a day (BID) | ORAL | Status: DC
  Administered 2021-06-14 – 2021-06-17 (×7): 25 mg
  Filled 2021-06-14 (×7): qty 1

## 2021-06-14 MED ORDER — METFORMIN HCL 500 MG PO TABS
500.0000 mg | ORAL_TABLET | Freq: Two times a day (BID) | ORAL | Status: DC
  Administered 2021-06-14 – 2021-06-17 (×7): 500 mg
  Filled 2021-06-14 (×7): qty 1

## 2021-06-14 MED ORDER — GLUCERNA 1.5 CAL PO LIQD
1190.0000 mL | ORAL | Status: DC
  Administered 2021-06-14 – 2021-06-15 (×2): 1190 mL
  Filled 2021-06-14 (×4): qty 2000

## 2021-06-14 MED ORDER — PROSOURCE TF PO LIQD
45.0000 mL | Freq: Every day | ORAL | Status: DC
  Administered 2021-06-15 – 2021-06-17 (×3): 45 mL
  Filled 2021-06-14 (×3): qty 45

## 2021-06-14 MED ORDER — METFORMIN HCL 500 MG PO TABS: 500.0000 mg | ORAL_TABLET | Freq: Two times a day (BID) | ORAL | Status: DC

## 2021-06-14 MED ORDER — ROSUVASTATIN CALCIUM 20 MG PO TABS
20.0000 mg | ORAL_TABLET | Freq: Every day | ORAL | Status: DC
  Administered 2021-06-14 – 2021-06-17 (×4): 20 mg
  Filled 2021-06-14 (×4): qty 1

## 2021-06-14 MED ORDER — OXYCODONE HCL 5 MG PO TABS
2.5000 mg | ORAL_TABLET | Freq: Four times a day (QID) | ORAL | Status: DC | PRN
Start: 2021-06-14 — End: 2021-06-17
  Administered 2021-06-14 – 2021-06-16 (×6): 2.5 mg
  Filled 2021-06-14 (×6): qty 1

## 2021-06-14 MED ORDER — ASPIRIN 81 MG PO CHEW
81.0000 mg | CHEWABLE_TABLET | Freq: Every day | ORAL | Status: DC
  Administered 2021-06-14 – 2021-06-17 (×4): 81 mg
  Filled 2021-06-14 (×4): qty 1

## 2021-06-14 NOTE — TOC Progression Note (Addendum)
Transition of Care [facility]) - Progression Note    Patient Details  Name: Dana Coleman MRN: [id number] Date of Birth: 1941/09/17  Transition of Care [facility]) CM/SW [place], [place] Phone Number: 06/14/2021, 11:11 AM  Clinical Narrative:    CSW followed up with [facility] who noted they cannot offer a bed, as they will not have any available until mid next week. CSW spoke with pt's sister and gave her the three facilities that have offered at this time. She stated she will review and follow up with CSW.  [place] has been started, when bed has been chosen, call [phone] to update insurance auth. TOC will continue to follow.  11:45 CSW received call from Sister stating family would like for pt to go to [place]. CSW updated facility and they stated they could take pt over the weekend with auth. CSW attempted to update auth, Left VM. Expected Discharge Plan: [place] Barriers to Discharge: Continued Medical Work up, SNF Pending bed offer  Expected Discharge Plan and Services Expected Discharge Plan: [place] In-house Referral: Clinical Social Work     Living arrangements for the past 2 months: Single Family Home                                       Social Determinants of Health (SDOH) Interventions    Readmission Risk Interventions No flowsheet data found.

## 2021-06-14 NOTE — Progress Notes (Signed)
Physical Therapy Treatment Patient Details Name: Dana Coleman MRN: [id number] DOB: 12/04/1940 Today's Date: 06/14/2021    History of Present Illness Pt is a 80 yo female admitted on 05/04/21 for AMS. HX of cholecystectomy on 7/21.  Intubated upon admission, 7/24 Started CRRT, Esophageal perforation with septic shock post flap repair, drainage of empyema and decortication of R lung with chest tube placed.  7/25 EKG changes and probable nstemi. Extubated 7/26. CT removed 8/4. Pt with new PE with R heart strain and increased O2 HFNC requirements 8/8. G-tube placed 8/22. PMH includes: R breast CA, DM, CAD, HTN.    PT Comments    Pt making steady progress with mobility and able to amb in hallway short distance with assistance. Continue to recommend SNF.    Follow Up Recommendations  SNF;Supervision/Assistance - 24 hour     Equipment Recommendations  Rolling walker with 5" wheels    Recommendations for Other Services       Precautions / Restrictions Precautions Precautions: Fall Precaution Comments: watch HR/RR, G tube    Mobility  Bed Mobility Overal bed mobility: Needs Assistance Bed Mobility: Supine to Sit   Sidelying to sit: Min assist;HOB elevated       General bed mobility comments: Assist to elevate trunk into sitting.    Transfers Overall transfer level: Needs assistance Equipment used: Rolling walker (2 wheeled) Transfers: Sit to/from Stand Sit to Stand: Min guard Stand pivot transfers: Min assist;Min guard       General transfer comment: Verbal cues for hand placement. Min assist to bring hips up from bed and min guard from [facility]  Ambulation/Gait Ambulation/Gait assistance: Min assist Gait Distance (Feet): 40 Feet Assistive device: Rolling walker (2 wheeled) Gait Pattern/deviations: Decreased step length - right;Decreased step length - left;Step-through pattern Gait velocity: decr Gait velocity interpretation: <1.31 ft/sec, indicative of household  ambulator General Gait Details: Assist for balance and support. Brief standing rest break   Stairs             Wheelchair Mobility    Modified Rankin (Stroke Patients Only)       Balance Overall balance assessment: Needs assistance Sitting-balance support: No upper extremity supported;Feet supported Sitting balance-Leahy Scale: Good     Standing balance support: Bilateral upper extremity supported;During functional activity Standing balance-Leahy Scale: Poor Standing balance comment: walker and min guard for static standing                            Cognition Arousal/Alertness: Awake/alert Behavior During Therapy: WFL for tasks assessed/performed Overall Cognitive Status: Within Functional Limits for tasks assessed                                        Exercises      General Comments General comments (skin integrity, edema, etc.): VSS on RA      Pertinent Vitals/Pain Pain Assessment: Faces Faces Pain Scale: No hurt    Home Living                      Prior Function            PT Goals (current goals can now be found in the care plan section) Progress towards PT goals: Progressing toward goals    Frequency    Min 3X/week      PT Plan Current plan  remains appropriate    Co-evaluation              AM-PAC PT "6 Clicks" Mobility   Outcome Measure  Help needed turning from your back to your side while in a flat bed without using bedrails?: A Little Help needed moving from lying on your back to sitting on the side of a flat bed without using bedrails?: A Little Help needed moving to and from a bed to a chair (including a wheelchair)?: A Little Help needed standing up from a chair using your arms (e.g., wheelchair or bedside chair)?: A Little Help needed to walk in hospital room?: A Little Help needed climbing 3-5 steps with a railing? : Total 6 Click Score: 16    End of Session Equipment Utilized  During Treatment: Gait belt Activity Tolerance: Patient limited by fatigue Patient left: in chair;with call bell/phone within reach;with chair alarm set Nurse Communication: Mobility status PT Visit Diagnosis: Other abnormalities of gait and mobility (R26.89);Muscle weakness (generalized) (M62.81);Difficulty in walking, not elsewhere classified (R26.2);Unsteadiness on feet (R26.81)     Time: [id number] PT Time Calculation (min) (ACUTE ONLY): 26 min  Charges:  $Gait Training: 23-37 mins                     [place] Pager [phone] Office [place] 06/14/2021, 11:37 AM

## 2021-06-14 NOTE — Progress Notes (Signed)
Nutrition Follow-up  DOCUMENTATION CODES:   Severe malnutrition in context of acute illness/injury  INTERVENTION:    Transition to nocturnal tube feedings:  -Glucerna 1.5 @ 85 ml/hr x 14 hours via PEG (1800-0800) -ProSource TF 45 ml once daily  -Free water flushes 150 ml Q4 hours   Provides: 1825 kcals, 109 grams protein, 903 ml free water (1803 ml with flushes)  Provide tube feeding to meet 100% of needs given ongoing poor oral intake. Once intake progresses and remains consistent, can decrease rate and/or time.   NUTRITION DIAGNOSIS:   Severe Malnutrition related to acute illness as evidenced by moderate fat depletion, mild fat depletion, moderate muscle depletion.  Ongoing  GOAL:   Patient will meet greater than or equal to 90% of their needs  Addressed via TF  MONITOR:   Diet advancement, I & O's, Labs, Weight trends, Skin (TPN)  REASON FOR ASSESSMENT:   Consult Calorie Count  ASSESSMENT:   80 yo female admitted post recent lap cholecystectomy with acute respiratory failure and renal failure with severe lactic acidosis from sepsis and found to have esophageal perforation, empyema. PMH includes DM, HTN, HLD, CAD  7/21 Lap chole 7/23 Admitted, Intubated, Pigtail by CCS in ED with bilious output 7/24 CRRT initiated, R Thoracotomy, repair of esophageal perforation, drainage of empyema and decortication of right lung 7/26 Extubated, TPN initiated 7/31 Stop CRRT 8/01 Barium swallow, no leak 8/02 CL diet started 8/12 s/p MBSS- advanced to dysphagia 1 diet with thin liquids 8/22 PEG placed, TF started  8/24 TPN stopped   Patient reports appetite remains decreased. Denies feelings of fullness or nausea. Last five meal completions charted as 0-20%. Having ongoing loose stools likely medication related. Fibercon added.   Transition to diabetic friendly formula to help promote CBGs within defined range. Attempt nocturnal feedings to promote appetite during the day. Hold  off on bolus feedings for now as patient has had difficulty managing large boluses in the past.   Admission weight: 58 kg  Current weight: 64.7 kg   UOP: 2250 ml x 24 hrs   Medications: jardiance, SS novolog, semglee, meformin, fibercon Labs: CBG 130-190  Diet Order:   Diet Order             DIET DYS 2 Room service appropriate? Yes with Assist; Fluid consistency: Thin  Diet effective now                   EDUCATION NEEDS:   Not appropriate for education at this time  Skin:  Skin Assessment: Skin Integrity Issues: Skin Integrity Issues:: DTI Stage II: buttocks x2 Stage I: sacrum Incisions: abdomen/chest Skin tear: back   Last BM:  9/2  Height:   Ht Readings from Last 1 Encounters:  05/04/21 '5\' 1"'$  (1.549 m)    Weight:   Wt Readings from Last 1 Encounters:  06/14/21 64.7 kg   BMI:  Body mass index is 26.95 kg/m.  Estimated Nutritional Needs:   Kcal:  1850-2050 kcal  Protein:  95-115 grams  Fluid:  >/= 1.8 L/day  Mariana Single MS, RD, LDN, CNSC Clinical Nutrition Pager listed in [place]

## 2021-06-15 DIAGNOSIS — K223 Perforation of esophagus: Secondary | ICD-10-CM | POA: Diagnosis not present

## 2021-06-15 LAB — GLUCOSE, CAPILLARY
Glucose-Capillary: 119 mg/dL — ABNORMAL HIGH (ref 70–99)
Glucose-Capillary: 121 mg/dL — ABNORMAL HIGH (ref 70–99)
Glucose-Capillary: 140 mg/dL — ABNORMAL HIGH (ref 70–99)
Glucose-Capillary: 170 mg/dL — ABNORMAL HIGH (ref 70–99)
Glucose-Capillary: 99 mg/dL (ref 70–99)

## 2021-06-15 NOTE — TOC Progression Note (Addendum)
Transition of Care [facility]) - Progression Note    Patient Details  Name: Dana Coleman MRN: [id number] Date of Birth: 1941-06-07  Transition of Care [facility]) CM/SW [place], [place] Phone Number: [phone] 06/15/2021, 11:00 AM  Clinical Narrative:     CSW checked [employer] protal and authorization has been approved.  [name] [id number]  Health Plan #- Pending Case Manager: Donell Sievert Authorization dates: 9/2-9/6 Fax Number: [phone]    CSW is attempting to update [employer] health of facility that has been chosen however CSW has called [employer] several times and waited a total over a hour and no one has picked up. CSW left VM.  12:40PM- CSW received a call back and facility information has been given previously.  CSW followed up with facility and pt will need new updated COVID.  TOC team will continue to assist with discharge planning needs.    Expected Discharge Plan: [place] Barriers to Discharge: Continued Medical Work up, SNF Pending bed offer  Expected Discharge Plan and Services Expected Discharge Plan: [place] In-house Referral: Clinical Social Work     Living arrangements for the past 2 months: Single Family Home                                       Social Determinants of Health (SDOH) Interventions    Readmission Risk Interventions No flowsheet data found.

## 2021-06-15 NOTE — Progress Notes (Signed)
Family Medicine Teaching Service Daily Progress Note Intern Pager: [phone]  Patient name: Dana Coleman Medical record number: [id number] Date of birth: Oct 14, 1940 Age: 80 y.o. Gender: female  Primary Care Provider: Gifford Shave, MD Consultants: Cardio, pulm, CVTS, GI (All signed off) Code Status: Partial   Pt Overview and Major Events to Date:  7/23 admitted to ICU, intubated, right pigtail catheter placed 7/24 CRRT, emergent right thoracotomy, repair of esophageal perforation with intercostal pedicled muscle flap and drainage of empyema and decortication of right lung  7/25 NSTEMI, no intervention 7/26 Extubated, TPN started  8/1 Esophagram without leak, PO started, heparin gtt d/c because of increased bleeding from chest tubes. Transfused 1U pRBCs 8/3 Anterior chest tube removed  8/4 Remaining chest tube removed  8/8 increasing O2 requirement  8/9 Further increased O2 requirement, Afib 8/13 started Entresto, decreasing supplemental oxygen requirement  8/22 G-tube placed    Assessment and Plan:  HFrEF Weight change  She has gone up 40 lbs since admission, per documented weights. Weight change has been difficult to document due to difficulty of weighing the patient while in bed.  Last 2 weights have been 144 > 142.  Patient is status post 1 dose of Lasix p.o. 20 mg.  No evidence of volume overload has been seen on exam.  Patient denies shortness of breath.  Output has been 900 mL over the last day.  -Daily weights -Monitor I's and O's -No need for Lasix at this point but we will continue to monitor  Diarrhea increased bowel movements Patient reports that her bowel movements have been better today. - Metformin has been changed to be taken with meals - We have increased her FiberCon as well - Continue to monitor symptoms  Status post G-tube placement Continues to have no evidence of infection and has remained afebrile.  Patient still is severely deconditioned due to  her prolonged hospital stay.  Current pain regimen is oxycodone 2.5 mg every 6 hours as needed Tylenol 630 mg every 4 hours scheduled.  No other pain medication has been needed for breakthrough pain.  Patient had slight tachypnea to 23. However, I saw the patient and she was in no acute distress. -Continue current pain regimen - Osmolite at 50 mill per hour - Dysphagia diet per SLP - Lidocaine patches as needed new G-tube for pain - Nutrition is following and are attempting nocturnal feeds, appreciate recommendations -Have increased FiberCon to help bowel movements  Chronic conditions that are stable:  A. fib Heart rate has been stable.  Current heart rate is 89. - On Eliquis -Holding Entresto until outpatient - Metoprolol succinate 50 mg once daily  PE Patient has been satting in the high 90s for several days on room air.  1 episode of tachypnea to 23.  I saw the patient she denies any shortness of breath and was in no acute distress at that time. - On Eliquis  DM2 CBG today:141>121>170 -On Semglee 5 units and sliding scale insulin.  We have started Jardiance and metformin as well at her home doses -Metformin 500 mg twice daily and Jardiance 10 mg daily -Continue to monitor  Positive acid-fast bacilli from pleural fluid  We will run this by pulmonology   Hyperkalemia, resolved Potassium has remained in normal limits and currently we are holding Entresto until discharge   Stable for SNF placement    FEN/GI: Dysphagia 2 diet PPx: Eliquis Dispo:Stable for SNF placement   Subjective:  Reports that she is doing well today.  Denies shortness of breath, chest pain or abdominal pain.  Her bowel movements have been more spaced out.  Objective: Temp:  [98.1 F (36.7 C)-98.7 F (37.1 C)] 98.7 F (37.1 C) (09/03 1144) Pulse Rate:  [80-98] 85 (09/03 1144) Resp:  [20-24] 20 (09/03 1144) BP: (114-142)/(62-84) 142/84 (09/03 1144) SpO2:  [95 %-97 %] 96 % (09/03 0809) Weight:  [64.8  kg] 64.8 kg (09/03 1100) Physical Exam Vitals reviewed.  Constitutional:      General: She is not in acute distress.    Appearance: Normal appearance. She is not ill-appearing.  Cardiovascular:     Rate and Rhythm: Normal rate and regular rhythm.     Heart sounds: No murmur heard. Pulmonary:     Effort: Pulmonary effort is normal.     Breath sounds: Normal breath sounds.  Abdominal:     General: Bowel sounds are normal. There is no distension.     Palpations: Abdomen is soft.     Tenderness: There is abdominal tenderness (Mild tenderness around G-tube site).  Skin:    General: Skin is warm and dry.     Comments: G-tube in place with dressing. Clean and dry.   Neurological:     Mental Status: She is alert.  Psychiatric:        Mood and Affect: Mood normal.        Behavior: Behavior normal.     Laboratory: Recent Labs  Lab 06/09/21 0600 06/13/21 0130  WBC 6.1 6.6  HGB 9.5* 9.9*  HCT 30.2* 32.0*  PLT 229 276   Recent Labs  Lab 06/09/21 0600 06/10/21 0452 06/13/21 0130 06/14/21 0142  NA 136 136 137 136  K 4.9 4.8 4.6 4.6  CL 106 103 104 103  CO2 '27 27 26 26  '$ BUN '17 15 22 20  '$ CREATININE 0.61 0.59 0.67 0.71  CALCIUM 8.7* 8.9 8.9 8.8*  PROT 5.8* 5.7* 6.1*  --   BILITOT 0.2* 0.3 0.3  --   ALKPHOS 82 73 82  --   ALT 63* 52* 52*  --   AST 52* 39 44*  --   GLUCOSE 218* 204* 198* 142*      Erskine Emery, MD 06/15/2021, 12:00 PM PGY-1, [place] Intern pager: [phone], text pages welcome

## 2021-06-15 NOTE — Progress Notes (Addendum)
Mobility Specialist: Progress Note   06/15/21 1129  Mobility  Activity Ambulated in room  Level of Assistance Minimal assist, patient does 75% or more  Assistive Device Front wheel walker  Distance Ambulated (ft) 24 ft  Mobility Ambulated with assistance in room  Mobility Response Tolerated fair  Mobility performed by Mobility specialist  $Mobility charge 1 Mobility   Pre-Mobility: 87 HR, 97% SpO2 Post-Mobility: 90 HR, 97% SpO2  Pt required minA with bed mobility as well as to stand. Pt contact guard during ambulation. Pt c/o feeling SOB during ambulation but otherwise asx. Pt back to bed after walk with call bell and phone at her side. A few minutes after session pt called out d/t emesis, VSS. RN present in the room.   [facility] Katy Wilbur [place] Mobility Specialist Mobility Specialist Phone: [phone]

## 2021-06-16 ENCOUNTER — Inpatient Hospital Stay (HOSPITAL_COMMUNITY): Payer: Medicare Other

## 2021-06-16 DIAGNOSIS — K223 Perforation of esophagus: Secondary | ICD-10-CM | POA: Diagnosis not present

## 2021-06-16 LAB — BASIC METABOLIC PANEL
Anion gap: 6 (ref 5–15)
BUN: 24 mg/dL — ABNORMAL HIGH (ref 8–23)
CO2: 26 mmol/L (ref 22–32)
Calcium: 9.3 mg/dL (ref 8.9–10.3)
Chloride: 104 mmol/L (ref 98–111)
Creatinine, Ser: 0.78 mg/dL (ref 0.44–1.00)
GFR, Estimated: >60 mL/min (ref >60)
Glucose, Bld: 125 mg/dL — ABNORMAL HIGH (ref 70–99)
Potassium: 4.7 mmol/L (ref 3.5–5.1)
Sodium: 136 mmol/L (ref 135–145)

## 2021-06-16 LAB — GLUCOSE, CAPILLARY
Glucose-Capillary: 112 mg/dL — ABNORMAL HIGH (ref 70–99)
Glucose-Capillary: 114 mg/dL — ABNORMAL HIGH (ref 70–99)
Glucose-Capillary: 133 mg/dL — ABNORMAL HIGH (ref 70–99)
Glucose-Capillary: 146 mg/dL — ABNORMAL HIGH (ref 70–99)

## 2021-06-16 LAB — SARS CORONAVIRUS 2 (TAT 6-24 HRS): SARS Coronavirus 2: NEGATIVE

## 2021-06-16 MED ORDER — SODIUM CHLORIDE 0.9% FLUSH: 3.0000 mL | INTRAVENOUS | Status: DC | PRN

## 2021-06-16 MED ORDER — SODIUM CHLORIDE 0.9% FLUSH
3.0000 mL | Freq: Two times a day (BID) | INTRAVENOUS | Status: DC
  Administered 2021-06-16 – 2021-06-17 (×3): 3 mL via INTRAVENOUS

## 2021-06-16 MED ORDER — CALCIUM POLYCARBOPHIL 625 MG PO TABS
1250.0000 mg | ORAL_TABLET | Freq: Three times a day (TID) | ORAL | Status: DC
  Administered 2021-06-16: 1250 mg via ORAL
  Filled 2021-06-16 (×5): qty 2

## 2021-06-16 MED ORDER — GLUCERNA 1.5 CAL PO LIQD
1000.0000 mL | ORAL | Status: DC
  Administered 2021-06-16: 1000 mL
  Filled 2021-06-16 (×2): qty 1000

## 2021-06-16 NOTE — Progress Notes (Signed)
FPTS Interim Night Progress Note  S:Patient sleeping comfortably.  Rounded with primary night RN.  No concerns voiced.  No orders required.    O: Today's Vitals   06/15/21 2043 06/15/21 2049 06/15/21 2134 06/15/21 2357  BP: 133/71   123/73  Pulse: 86   86  Resp: 20   20  Temp: 98.5 F (36.9 C)   98.4 F (36.9 C)  TempSrc: Oral   Oral  SpO2: 95%   96%  Weight:      Height:      PainSc:  8  Asleep 0-No pain      A/P: COVID negative Continue current management  Carollee Leitz MD PGY-3, [place] Medicine Service pager [phone]

## 2021-06-16 NOTE — Progress Notes (Signed)
FPTS Brief Progress Note  S:Pt is doing well without any complaints. No more emesis since last recorded and she denies any difficulty breathing    O: BP 123/76 (BP Location: Left Arm)   Pulse 77   Temp 98.5 F (36.9 C) (Oral)   Resp 20   Ht '5\' 1"'$  (1.549 m)   Wt 64.2 kg   SpO2 93%   BMI 26.74 kg/m   General: Alert and oriented in no apparent distress Heart: Regular rate and rhythm with no murmurs appreciated Lungs: CTA bilaterally, no wheezing Skin: Warm and dry, G-Tube in place  Extremities: No lower extremity edema  A/P: Plan per day team  - Orders reviewed. Labs for AM not ordered, which was adjusted as needed.  - Nutrition has changed the nocturnal feeds  - Will monitor emesis and SOB overnight  - Once episode of tachypnea recorded on vitals, went to see the pt at bedside, no concerns   Erskine Emery, MD 06/17/2021, 12:03 AM PGY-1, [facility] Night Resident  Please page [phone] with questions.

## 2021-06-16 NOTE — Progress Notes (Signed)
Mobility Specialist: Progress Note   06/16/21 1600  Mobility  Activity Ambulated in hall  Level of Assistance Minimal assist, patient does 75% or more  Assistive Device Four wheel walker  Distance Ambulated (ft) 70 ft (35'x2)  Mobility Ambulated with assistance in hallway  Mobility Response Tolerated well  Mobility performed by Mobility specialist  $Mobility charge 1 Mobility   Pre-Mobility: 85 HR, 126/79 BP, 97% SpO2 Post-Mobility: 86 HR, 132/75 BP, 97% SpO2  Pt required minA for bed mobility as well as to stand. Pt able to ambulate in the hallway today using rollator requiring one seated break d/t general fatigue, otherwise asx. Pt back to bed after walk with call bell and phone at her side and pt's sister present in the room.   [facility] Jakaiden Fill Mobility Specialist Mobility Specialist Phone: [phone]

## 2021-06-16 NOTE — Progress Notes (Addendum)
Family Medicine Teaching Service Daily Progress Note Intern Pager: [phone]  Patient name: Dana Coleman Medical record number: [id number] Date of birth: 1941-05-23 Age: 80 y.o. Gender: female  Primary Care Provider: Gifford Shave, MD Consultants: Cardio, pulm, CVTS, GI (All signed off) Code Status: Partial  Pt Overview and Major Events to Date:  7/23 admitted to ICU, intubated, right pigtail catheter placed 7/24 CRRT, emergent right thoracotomy, repair of esophageal perforation with intercostal pedicled muscle flap and drainage of empyema and decortication of right lung  7/25 NSTEMI, no intervention 7/26 Extubated, TPN started  8/1 Esophagram without leak, PO started, heparin gtt d/c because of increased bleeding from chest tubes. Transfused 1U pRBCs 8/3 Anterior chest tube removed  8/4 Remaining chest tube removed  8/8 increasing O2 requirement  8/9 Further increased O2 requirement, Afib 8/13 started Entresto, decreasing supplemental oxygen requirement  8/22 G-tube placed  Assessment and Plan:  HFrEF Weight change  Patient with newly diagnosed HFrEF (EF of 25-30%). Last 3 weights: [id number]. No pitting edema in dependent areas noted. Patient reports SoB. No significant dyspnea objectively (see below). -no evidence of volume overload -Monitor I&Os: good UOP, NP +240 mL -No need for diuresis currently -GDMT: metoprolol tartrate 25 mg, Jardiance 10 mg, no ARB or MRA 2/2 previous hyperK -Will need OP cards f/u  Dyspnea Patient complaining of recent vomiting and potentially new SoB, concern for aspiration PNA or worsening of HF. On my exam, no crackles, however, this is limited 2/2 inability to sit up.  -CXR: unremarkable, no new focal consolidation that would explain SoB  -CTM SoB and HR  Status post G-tube placement Continues to have no evidence of infection and has remained afebrile.  Patient still is severely deconditioned due to her prolonged hospital stay.  Current  pain regimen is oxycodone 2.5 mg every 6 hours as needed Tylenol 650 mg every 4 hours scheduled. Pain under control, no signs of infection (no fever, no drainage from site). -Continue current pain regimen - Tube feeds per RD (currently from 1800-800) - Dysphagia diet per SLP - Lidocaine patches as needed new G-tube for pain - Nutrition is following and are attempting nocturnal feeds, appreciate recommendations   Diarrhea increased bowel movements Peristent diarrhea in the context of recent switch to nocturnal tube feeds  -Increase Fibercon to 2 tabs TID -Call RD and discuss today -Continue to monitor symptoms  Chronic conditions that are stable:  A. fib Heart rate has been stable.  No evidence of AF with RVR. - On Eliquis -Holding Entresto until outpatient - Metoprolol succinate 50 mg once daily  Pulmonary embolism PE with heart strain previously -On Eliquis -CTM SpO2  DM2 CBGs: fasting glc of 146, others in the 100's -On Semglee 5 units and sliding scale insulin.   -Metformin 500 mg twice daily and Jardiance 10 mg daily (home meds) -Continue to monitor   Positive acid-fast bacilli from pleural fluid  Noted on 8/27 (no further action at this time)   Hyperkalemia, resolved Potassium has remained in normal limits and currently we are holding Entresto until discharge  FEN/GI: Dysphagia 2 diet PPx: Eliquis Dispo: has received SNF placement, but currently needs to have tube feeds adjusted for improvement in N/V/diarrhea and w/u for SoB.    Subjective:  Patient reports a stable, low level of pain. She reports SoB, saying she has had this previously (documentation recently has patient denying SoB). She reports persistent diarrhea. She reports vomiting twice last night. Volume unknown.  Objective: Temp:  [97.6 F (  36.4 C)-98.7 F (37.1 C)] 98.4 F (36.9 C) (09/04 0823) Pulse Rate:  [84-86] 84 (09/04 0503) Resp:  [18-25] 25 (09/04 0823) BP: (121-142)/(67-84) 121/70 (09/04  0823) SpO2:  [95 %-96 %] 96 % (09/04 0503) Weight:  [141 lb 8.6 oz (64.2 kg)-142 lb 13.7 oz (64.8 kg)] 141 lb 8.6 oz (64.2 kg) (09/04 0503) Physical Exam Vitals reviewed.  Cardiovascular:     Rate and Rhythm: Normal rate and regular rhythm.  Pulmonary:     Effort: Pulmonary effort is normal.     Breath sounds: Normal breath sounds.  Abdominal:     General: Bowel sounds are normal.     Palpations: Abdomen is soft.     Tenderness: There is no abdominal tenderness.     Comments: G-tube in place with clean dressing  Neurological:     Mental Status: She is alert.     Laboratory: Recent Labs  Lab 06/13/21 0130  WBC 6.6  HGB 9.9*  HCT 32.0*  PLT 276   Recent Labs  Lab 06/10/21 0452 06/13/21 0130 06/14/21 0142 06/16/21 0100  NA 136 137 136 136  K 4.8 4.6 4.6 4.7  CL 103 104 103 104  CO2 '27 26 26 26  '$ BUN '15 22 20 '$ 24*  CREATININE 0.59 0.67 0.71 0.78  CALCIUM 8.9 8.9 8.8* 9.3  PROT 5.7* 6.1*  --   --   BILITOT 0.3 0.3  --   --   ALKPHOS 73 82  --   --   ALT 52* 52*  --   --   AST 39 44*  --   --   GLUCOSE 204* 198* 142* 125*    Imaging/Diagnostic Tests: CXR today as above  Corky Sox PGY-1, Psychiatry FPTS Intern pager: [phone], text pages welcome

## 2021-06-16 NOTE — Progress Notes (Signed)
Nutrition Follow-up  Received page from MD regarding N/V x 2 days post change in TF.   Noted pt was changed to nocturnal TF on 9/2 but formula was also changed at that time from Osmolite to Glucerna.   Request from MD to change TF back to 24 hours to see if this improves pt N/V.   If N/V does improve, plan to change back to Osmolite formula.   TF changed to Glucerna 1.5 at 50 ml/hr  Kerman Passey MS, RDN, LDN, CNSC Registered Dietitian III Clinical Nutrition RD Pager and On-Call Pager Number Located in [place]

## 2021-06-17 DIAGNOSIS — E1136 Type 2 diabetes mellitus with diabetic cataract: Secondary | ICD-10-CM | POA: Diagnosis not present

## 2021-06-17 DIAGNOSIS — K219 Gastro-esophageal reflux disease without esophagitis: Secondary | ICD-10-CM | POA: Diagnosis not present

## 2021-06-17 DIAGNOSIS — Z794 Long term (current) use of insulin: Secondary | ICD-10-CM | POA: Diagnosis not present

## 2021-06-17 DIAGNOSIS — J90 Pleural effusion, not elsewhere classified: Secondary | ICD-10-CM | POA: Diagnosis not present

## 2021-06-17 DIAGNOSIS — Z7901 Long term (current) use of anticoagulants: Secondary | ICD-10-CM | POA: Diagnosis not present

## 2021-06-17 DIAGNOSIS — Z9181 History of falling: Secondary | ICD-10-CM | POA: Diagnosis not present

## 2021-06-17 DIAGNOSIS — Z86711 Personal history of pulmonary embolism: Secondary | ICD-10-CM | POA: Diagnosis not present

## 2021-06-17 DIAGNOSIS — E43 Unspecified severe protein-calorie malnutrition: Secondary | ICD-10-CM | POA: Diagnosis not present

## 2021-06-17 DIAGNOSIS — I509 Heart failure, unspecified: Secondary | ICD-10-CM | POA: Diagnosis not present

## 2021-06-17 DIAGNOSIS — Z743 Need for continuous supervision: Secondary | ICD-10-CM | POA: Diagnosis not present

## 2021-06-17 DIAGNOSIS — D34 Benign neoplasm of thyroid gland: Secondary | ICD-10-CM | POA: Diagnosis not present

## 2021-06-17 DIAGNOSIS — I110 Hypertensive heart disease with heart failure: Secondary | ICD-10-CM | POA: Diagnosis not present

## 2021-06-17 DIAGNOSIS — H269 Unspecified cataract: Secondary | ICD-10-CM | POA: Diagnosis not present

## 2021-06-17 DIAGNOSIS — L89156 Pressure-induced deep tissue damage of sacral region: Secondary | ICD-10-CM | POA: Diagnosis not present

## 2021-06-17 DIAGNOSIS — D510 Vitamin B12 deficiency anemia due to intrinsic factor deficiency: Secondary | ICD-10-CM | POA: Diagnosis not present

## 2021-06-17 DIAGNOSIS — J9601 Acute respiratory failure with hypoxia: Secondary | ICD-10-CM | POA: Diagnosis not present

## 2021-06-17 DIAGNOSIS — J939 Pneumothorax, unspecified: Secondary | ICD-10-CM | POA: Diagnosis not present

## 2021-06-17 DIAGNOSIS — M722 Plantar fascial fibromatosis: Secondary | ICD-10-CM | POA: Diagnosis not present

## 2021-06-17 DIAGNOSIS — E785 Hyperlipidemia, unspecified: Secondary | ICD-10-CM | POA: Diagnosis not present

## 2021-06-17 DIAGNOSIS — D649 Anemia, unspecified: Secondary | ICD-10-CM | POA: Diagnosis not present

## 2021-06-17 DIAGNOSIS — E1100 Type 2 diabetes mellitus with hyperosmolarity without nonketotic hyperglycemic-hyperosmolar coma (NKHHC): Secondary | ICD-10-CM | POA: Diagnosis not present

## 2021-06-17 DIAGNOSIS — E46 Unspecified protein-calorie malnutrition: Secondary | ICD-10-CM | POA: Diagnosis not present

## 2021-06-17 DIAGNOSIS — I2510 Atherosclerotic heart disease of native coronary artery without angina pectoris: Secondary | ICD-10-CM | POA: Diagnosis not present

## 2021-06-17 DIAGNOSIS — G4700 Insomnia, unspecified: Secondary | ICD-10-CM | POA: Diagnosis not present

## 2021-06-17 DIAGNOSIS — Z9981 Dependence on supplemental oxygen: Secondary | ICD-10-CM | POA: Diagnosis not present

## 2021-06-17 DIAGNOSIS — Z7982 Long term (current) use of aspirin: Secondary | ICD-10-CM | POA: Diagnosis not present

## 2021-06-17 DIAGNOSIS — L89323 Pressure ulcer of left buttock, stage 3: Secondary | ICD-10-CM | POA: Diagnosis not present

## 2021-06-17 DIAGNOSIS — R29898 Other symptoms and signs involving the musculoskeletal system: Secondary | ICD-10-CM | POA: Diagnosis not present

## 2021-06-17 DIAGNOSIS — I2699 Other pulmonary embolism without acute cor pulmonale: Secondary | ICD-10-CM | POA: Diagnosis not present

## 2021-06-17 DIAGNOSIS — I252 Old myocardial infarction: Secondary | ICD-10-CM | POA: Diagnosis not present

## 2021-06-17 DIAGNOSIS — K223 Perforation of esophagus: Secondary | ICD-10-CM | POA: Diagnosis not present

## 2021-06-17 DIAGNOSIS — M6281 Muscle weakness (generalized): Secondary | ICD-10-CM | POA: Diagnosis not present

## 2021-06-17 DIAGNOSIS — J9602 Acute respiratory failure with hypercapnia: Secondary | ICD-10-CM | POA: Diagnosis not present

## 2021-06-17 DIAGNOSIS — M8580 Other specified disorders of bone density and structure, unspecified site: Secondary | ICD-10-CM | POA: Diagnosis not present

## 2021-06-17 DIAGNOSIS — R5381 Other malaise: Secondary | ICD-10-CM | POA: Diagnosis not present

## 2021-06-17 DIAGNOSIS — R0602 Shortness of breath: Secondary | ICD-10-CM | POA: Diagnosis not present

## 2021-06-17 DIAGNOSIS — E559 Vitamin D deficiency, unspecified: Secondary | ICD-10-CM | POA: Diagnosis not present

## 2021-06-17 DIAGNOSIS — N179 Acute kidney failure, unspecified: Secondary | ICD-10-CM | POA: Diagnosis not present

## 2021-06-17 DIAGNOSIS — E119 Type 2 diabetes mellitus without complications: Secondary | ICD-10-CM | POA: Diagnosis not present

## 2021-06-17 DIAGNOSIS — I5020 Unspecified systolic (congestive) heart failure: Secondary | ICD-10-CM | POA: Diagnosis not present

## 2021-06-17 DIAGNOSIS — I482 Chronic atrial fibrillation, unspecified: Secondary | ICD-10-CM | POA: Diagnosis not present

## 2021-06-17 DIAGNOSIS — Z7984 Long term (current) use of oral hypoglycemic drugs: Secondary | ICD-10-CM | POA: Diagnosis not present

## 2021-06-17 DIAGNOSIS — I10 Essential (primary) hypertension: Secondary | ICD-10-CM | POA: Diagnosis not present

## 2021-06-17 DIAGNOSIS — Z993 Dependence on wheelchair: Secondary | ICD-10-CM | POA: Diagnosis not present

## 2021-06-17 DIAGNOSIS — Z931 Gastrostomy status: Secondary | ICD-10-CM | POA: Diagnosis not present

## 2021-06-17 DIAGNOSIS — I4891 Unspecified atrial fibrillation: Secondary | ICD-10-CM | POA: Diagnosis not present

## 2021-06-17 LAB — GLUCOSE, CAPILLARY: Glucose-Capillary: 154 mg/dL — ABNORMAL HIGH (ref 70–99)

## 2021-06-17 MED ORDER — OXYCODONE HCL 5 MG PO TABS: 2.5000 mg | ORAL_TABLET | Freq: Four times a day (QID) | ORAL | 0 refills | Status: DC | PRN

## 2021-06-17 MED ORDER — ACETAMINOPHEN 500 MG PO TABS: 1000.0000 mg | ORAL_TABLET | Freq: Three times a day (TID) | ORAL | 0 refills | Status: AC

## 2021-06-17 MED ORDER — GLUCERNA 1.5 CAL PO LIQD: 1000.0000 mL | ORAL | 0 refills | Status: AC

## 2021-06-17 MED ORDER — PROSOURCE TF PO LIQD: 45.0000 mL | Freq: Every day | ORAL | 0 refills | Status: AC

## 2021-06-17 MED ORDER — METOPROLOL TARTRATE 25 MG PO TABS: 25.0000 mg | ORAL_TABLET | Freq: Two times a day (BID) | ORAL | 1 refills | Status: DC

## 2021-06-17 MED ORDER — MELATONIN 3 MG PO TABS: 3.0000 mg | ORAL_TABLET | Freq: Every day | ORAL | 1 refills | Status: AC

## 2021-06-17 MED ORDER — APIXABAN 5 MG PO TABS: 5.0000 mg | ORAL_TABLET | Freq: Two times a day (BID) | ORAL | 1 refills | Status: DC

## 2021-06-17 MED ORDER — ONDANSETRON HCL 4 MG PO TABS: 4.0000 mg | ORAL_TABLET | Freq: Every day | ORAL | 1 refills | Status: AC | PRN

## 2021-06-17 MED ORDER — CALCIUM POLYCARBOPHIL 625 MG PO TABS: 1250.0000 mg | ORAL_TABLET | Freq: Three times a day (TID) | ORAL | 1 refills | Status: DC

## 2021-06-17 NOTE — Care Management Important Message (Signed)
Important Message  Patient Details  Name: Dana Coleman MRN: [id number] Date of Birth: 1941/07/13   Medicare Important Message Given:  Yes     Shelda Altes 06/17/2021, 9:02 AM

## 2021-06-17 NOTE — Discharge Summary (Addendum)
[facility] Discharge Summary  Patient name: Dana Coleman record number: [id number] Date of birth: 1941/08/28 Age: 80 y.o. Gender: female Date of Admission: 05/04/2021  Date of Discharge: 06/17/21 Admitting Physician: Candee Furbish, MD  Primary Care Provider: Gifford Shave, MD Consultants: Cardio, pulm, CVTS, GI (All signed off)  Indication for Hospitalization: Empyema following cholecystectomy  Discharge Diagnoses/Problem List:  Empyema following cholecystectomy Esophageal perforation s/p repair Septic shock Acute renal failure requiring CRRT NSTEMI Dana onset A. fib Bilateral PE G-tube for feeding DM2 Breast Cancer history HTN  Disposition: SNF  Discharge Condition: Stable and improved  Discharge Exam:  Temp:  [98.2 F (36.8 C)-98.5 F (36.9 C)] 98.2 F (36.8 C) (09/05 0758) Pulse Rate:  [77-90] 87 (09/05 0758) Resp:  [20-25] 20 (09/05 0758) BP: (120-126)/(66-76) 124/75 (09/05 0758) SpO2:  [93 %-98 %] 93 % (09/05 0758) Weight:  [64.8 kg] 64.8 kg (09/05 0344) Physical Exam: General: Frail, African American woman, resting comfortably in bed, alert and oriented x3, thin appearing. Cardiovascular: RRR, NRMG Respiratory: CTABL, no wheezes or stridor Abdomen: Soft, non-tender, non-distended Extremities: No edema, no lesions, no rashes, pulses 2+  Brief Hospital Course:  Dana Coleman is a 80 y.o. F who presented to [facility] on 7/23 for AMS and SOB, two days after lap chole (7/21). PMHx is significant for T1DM, CAD, HTN, HLD, MI (2004), Hurthle cell adenoma (2004), cataracts, cervical cancer. Below is her brief hospital course listed by problem. For additional information, refer to her H&P.   Altered Mental Status  Acute Respiratory Distress  Esophageal Perforation  Complex pleural effusion consistent with empyema In ED patient hypotensive (80s/60s), bradycardic (40's) lethargic and hypoxic (82% on NRB  at 15L). Treated with calcium, bicarb for possible renal failure/acidosis and intubated with RSI. IVF and levophed started for hypotension. CXR with right loculated pleural effusion. Seen by surgery and right pigtail catheter was placed. Admitted to ICU from 7/23-8/5. She was found to have a hydropneumothorax and started on Zosyn. Chest tube placed. Pleural fluid labs with elevated LD and amylase, concerning for esophageal fluid. CT scan of chest, abd/pelv with air bubbles in loculated pleural fluid. Esophagram performed through NG tube. Found to have a mid esophageal perforation with leak into the mediastinum and right chest. She underwent emergent right thoracotomy, repair of esophageal perforation with intercostal pedicled muscle flap and drainage of empyema and decortication of right lung on 7/24. She was started on Eraxis on 7/23 as strep and candida grew from pleural fluid cultures. Extubated 7/26. Esophagram on 8/1 with no leak. There were signs of esophageal motility disorder. TPN was started but patient was gradually able to tolerate PO. Finished course of Eraxis, ceftriaxone and metronidazole after 3 weeks. She completed the course without further problems.  A. Fib with RVR  PE with right heart strain  hypoxia Patient developed hypoxia, tachypnea on 05/20/2021.  D-dimer elevated to 19.5, lactic acid 2.4.  Chest x-ray showed bilateral pleural effusions.  ABG showed hypoxia, and she required increasing oxygen needs.  Cardiology consulted and had concern for PE, CTA ordered, noted small burden PE.  Went into A. fib with RVR requiring control with metoprolol to sinus rhythm.  Received heparin drip and transition to Eliquis on 8/12.  Significant oxygen requirements up to HFNC 35 L and 40%.  Progressively weaned to room air on 06/06/21 and has been stable on eliquis since this time.  HFrEF  Weight Gain  Most recent echo on 05/21/21  shows ejection fraction of 25 to 30% with mild left ventricular hypertrophy  and severely decreased function.  Previous echo 05/06/21 showed an EF closer to 40% so this is a Dana finding.  She has gone up 40 pounds since admission, stable. No evidence of volume overload on physical exam. S/p Lasix '20mg'$  x1. No need for more Lasix at this time.   Renal Failure  Metabolic Acidosis- resolved Lactic acid >11. Given bicarb with improvement in HR to 50-60. Creatinine 4 (baseline <1). Given severe lactic acidosis, started on CRRT. Creatinine ultimately improved over the course of patients admission. Most updated Creatinine: 0.78 with GFR: >60.   Thrombocytopenia  Coagulopathy- resolved Platelets as low as 66 on 7/27. DIC panel significant for elevated PT, INR, fibrinogen, and d-dimer. 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 Platelets trended back up and WNL by time of discharge.  Atrial-Fibrillation  Bradycardia  NSTEMI On 7/25, HR as low as 25 bpm with MAP's in 50's. Dopamine started with stabilization of MAPs above 65. EKG with slight elevation in inferior leads and Troponins obtained. Troponin elevated at 12,201.  Also found to be in atrial fibrillation with low-HR. No intervention for NSTEMI given she was not a candidate for cath, intervention or anticoagulation given her recent surgery (7/24). Echo with EF 45%, regional wall motion abnormalities with basal to mid-inferolateral and basal inferior severe hypokinesis, mild LVH, G1DD, severely reduced RV systolic function. Started on heparin infusion on 7/28.  Transitioned to Eliquis on 8/12.    Acute Blood Loss Anemia While on heparin gtt there was increased bleeding noted around chest tube. Heparin gtt stopped on 8/1. Hgb 7.6. Received 1U PRBC. Hemoglobin remained stable and no additional transfusions were needed.  Severe Malnutrition Prealbumin 6.1. She was started on parenteral nutrition on 7/26. After esophagram performed and demonstrated no leak, she was gradually started back on PO. TPN was stopped on  06/05/21. Pt was given a G-Tube by IR to help with malnourishment. Patient attempted nightly bolus feeds but struggled with vomiting, which improved when switched to continuous feeds. Patient is stable on continuous feeds.  Sacral Deep Tissue Pressure Injury Developed during stay. Wound care consulted. Wound care throughout admission. Patient will need regular position changing, and silicone foam dressing to the area every 3 days.    Incidental pulmonary nodule A 6 mm noncalcified nodule in the right upper lobe was found incidentally on chest CT from 03/29/21. Radiology recommends a non-contrast chest CT at 6-12 months. If the nodule is stable at time of repeat CT, then future CT at 18-24 months (from 03/29/21 scan) is considered optional for low-risk patients, but is recommended for high-risk patients.  PCP follow-up recommendations CT chest repeat for lung nodules in December 2022 Restart Entresto, carvedilol, aldactone, and SGLT2 inhibitor at outpatient follow up. These were held due to renal function, hypokalemia, and hypotension. If improved at follow up, please consider restarting for HFrEF. Will need follow up with Gastroenterology, Cardiology, and Nutrition  Follow up AFP culture from pleural effusion  Schedule PCP follow up on discharge from SNF     Significant Procedures:  G-tube placed Emergent thoracotomy Repair of esophageal perforation with intercostal pedicled muscle flap  Drainage of empyema Decortication of right lung   Significant Labs and Imaging:  Recent Labs  Lab 06/13/21 0130  WBC 6.6  HGB 9.9*  HCT 32.0*  PLT 276   Recent Labs  Lab 06/13/21 0130 06/14/21 0142 06/16/21 0100  NA 137 136 136  K 4.6 4.6  4.7  CL 104 103 104  CO2 '26 26 26  '$ GLUCOSE 198* 142* 125*  BUN 22 20 24*  CREATININE 0.67 0.71 0.78  CALCIUM 8.9 8.8* 9.3  ALKPHOS 82  --   --   AST 44*  --   --   ALT 52*  --   --   ALBUMIN 1.9*  --   --     Results/Tests Pending at Time of  Discharge: None  Discharge Medications:  Allergies as of 06/17/2021   No Known Allergies      Medication List     STOP taking these medications    amLODipine 10 MG tablet Commonly known as: NORVASC   hydrochlorothiazide 12.5 MG tablet Commonly known as: HYDRODIURIL   Lantus SoloStar 100 UNIT/ML Solostar Pen Generic drug: insulin glargine   losartan 50 MG tablet Commonly known as: COZAAR   traMADol 50 MG tablet Commonly known as: Ultram       TAKE these medications    acetaminophen 500 MG tablet Commonly known as: TYLENOL Take 2 tablets (1,000 mg total) by mouth every 8 (eight) hours for 5 days.   apixaban 5 MG Tabs tablet Commonly known as: ELIQUIS Place 1 tablet (5 mg total) into feeding tube 2 (two) times daily.   aspirin EC 81 MG tablet Take 81 mg by mouth in the morning. Swallow whole.   Blood Glucose Monitoring Suppl Misc One touch ultra glucose monitor. Check blood sugar twice a day.   feeding supplement (GLUCERNA 1.5 CAL) Liqd Place 1,000 mLs into feeding tube continuous.   feeding supplement (PROSource TF) liquid Place 45 mLs into feeding tube daily. Start taking on: June 18, 2021   glucose blood test strip Commonly known as: ONE TOUCH ULTRA TEST CHECK BLOOD SUGAR TWICE DAILY   Insulin Pen Needle 31G X 5 MM Misc Commonly known as: B-D UF III MINI PEN NEEDLES INJECT INSULIN VIA PEN 6 TIMES DAILY   Jardiance 10 MG Tabs tablet Generic drug: empagliflozin TAKE 1 TABLET BY MOUTH EVERY DAY What changed: how much to take   melatonin 3 MG Tabs tablet Place 1 tablet (3 mg total) into feeding tube at bedtime.   metFORMIN 500 MG 24 hr tablet Commonly known as: GLUCOPHAGE-XR TAKE 1 TABLET BY MOUTH TWICE A DAY WITH BREAKFAST AND DINNER What changed: See the Dana instructions.   metoprolol tartrate 25 MG tablet Commonly known as: LOPRESSOR Place 1 tablet (25 mg total) into feeding tube 2 (two) times daily.   nitroGLYCERIN 0.4 MG SL  tablet Commonly known as: NITROSTAT Place 1 tablet (0.4 mg total) under the tongue every 5 (five) minutes as needed for chest pain. What changed: when to take this   omeprazole 40 MG capsule Commonly known as: PRILOSEC Take 40 mg by mouth every morning.   ondansetron 4 MG tablet Commonly known as: Zofran Take 1 tablet (4 mg total) by mouth daily as needed for nausea or vomiting.   OneTouch Delica Lancets 99991111 Misc 1 Container by Does not apply route as needed.   oxyCODONE 5 MG immediate release tablet Commonly known as: Oxy IR/ROXICODONE Place 0.5 tablets (2.5 mg total) into feeding tube every 6 (six) hours as needed for severe pain.   polycarbophil 625 MG tablet Commonly known as: FIBERCON Take 2 tablets (1,250 mg total) by mouth 3 (three) times daily.   rosuvastatin 20 MG tablet Commonly known as: CRESTOR TAKE 1 TABLET BY MOUTH EVERYDAY AT BEDTIME What changed: See the Dana instructions.  Vitamin D3 50 MCG (2000 UT) Tabs Take 2,000 Units by mouth in the morning.               Durable Medical Equipment  (From admission, onward)           Start     Ordered   06/12/21 1431  For home use only DME 3 n 1  Once        06/12/21 1430            Discharge Instructions: Please refer to Patient Instructions section of EMR for full details.  Patient was counseled important signs and symptoms that should prompt return to medical care, changes in medications, dietary instructions, activity restrictions, and follow up appointments.   Follow-Up Appointments:  Follow-up Information     Greer Pickerel, MD. Schedule an appointment as soon as possible for a visit.   Specialty: General Surgery Why: For wound re-check Contact information: [address] [place] [address] [phone]         Gaye Pollack, MD. Go on 06/26/2021.   Specialty: Cardiothoracic Surgery Why: Your appointment is at 4:30pm.  Please arrive 30 minutes early for a chest x-ray to  be performed by [facility] located on the first floor of the same building. Contact information: [address] [place] [address] [phone]                 Gladys Damme, MD 06/17/2021, 2:45 PM PGY-1, [place]

## 2021-06-17 NOTE — TOC Transition Note (Signed)
Transition of Care [facility]) - CM/SW Discharge Note   Patient Details  Name: ETRULIA CO MRN: [id number] Date of Birth: Jul 11, 1941  Transition of Care [facility]) CM/SW Contact:  Bethann Berkshire, [place] Phone Number: 06/17/2021, 1:19 PM   Clinical Narrative:     Patient will DC to: [place] Anticipated DC date: 06/17/21 Family notified: Casyn, Hees (Sister)  [phone] [facility]) Transport by: Corey Harold   Per MD patient ready for DC to [facility]. RN, patient, patient's family, and facility notified of DC. Discharge Summary and FL2 sent to facility. RN to call report prior to discharge ([phone]). DC packet on chart. Ambulance transport requested for patient.   CSW will sign off for now as social work intervention is no longer needed. Please consult [place] again if new needs arise.    Final next level of care: Skilled Nursing Facility Barriers to Discharge: No Barriers Identified   Patient Goals and CMS Choice        Discharge Placement              Patient chooses bed at:  [facility]) Patient to be transferred to facility by: [place] Name of family member notified: Sanah, [place] (Sister)   [phone] ([place]) Patient and family notified of of transfer: 06/17/21  Discharge Plan and Services In-house Referral: Clinical Social Work                                   Social Determinants of Health ([place]) Interventions     Readmission Risk Interventions No flowsheet data found.

## 2021-06-17 NOTE — Congregational Nurse Program (Addendum)
Gave report to April at [facility]. Answered all questions and provided a call back number.  Daymon Larsen, RN

## 2021-06-17 NOTE — Progress Notes (Signed)
Patient picked up by [facility]. PIV removed. Telemetry monitor removed, CCMD notified. Sister notified patient has left. Patients dentures left in room. Sister notified they are up front at nurses station with label. Sister will pick them up today.   Daymon Larsen, RN

## 2021-06-18 DIAGNOSIS — E1100 Type 2 diabetes mellitus with hyperosmolarity without nonketotic hyperglycemic-hyperosmolar coma (NKHHC): Secondary | ICD-10-CM | POA: Diagnosis not present

## 2021-06-18 DIAGNOSIS — E559 Vitamin D deficiency, unspecified: Secondary | ICD-10-CM | POA: Diagnosis not present

## 2021-06-19 DIAGNOSIS — I10 Essential (primary) hypertension: Secondary | ICD-10-CM | POA: Diagnosis not present

## 2021-06-19 DIAGNOSIS — E559 Vitamin D deficiency, unspecified: Secondary | ICD-10-CM | POA: Diagnosis not present

## 2021-06-19 DIAGNOSIS — L89156 Pressure-induced deep tissue damage of sacral region: Secondary | ICD-10-CM | POA: Diagnosis not present

## 2021-06-19 DIAGNOSIS — I2510 Atherosclerotic heart disease of native coronary artery without angina pectoris: Secondary | ICD-10-CM | POA: Diagnosis not present

## 2021-06-19 DIAGNOSIS — D649 Anemia, unspecified: Secondary | ICD-10-CM | POA: Diagnosis not present

## 2021-06-19 DIAGNOSIS — H269 Unspecified cataract: Secondary | ICD-10-CM | POA: Diagnosis not present

## 2021-06-19 DIAGNOSIS — I252 Old myocardial infarction: Secondary | ICD-10-CM | POA: Diagnosis not present

## 2021-06-19 DIAGNOSIS — E785 Hyperlipidemia, unspecified: Secondary | ICD-10-CM | POA: Diagnosis not present

## 2021-06-19 DIAGNOSIS — D510 Vitamin B12 deficiency anemia due to intrinsic factor deficiency: Secondary | ICD-10-CM | POA: Diagnosis not present

## 2021-06-19 DIAGNOSIS — E119 Type 2 diabetes mellitus without complications: Secondary | ICD-10-CM | POA: Diagnosis not present

## 2021-06-20 DIAGNOSIS — E119 Type 2 diabetes mellitus without complications: Secondary | ICD-10-CM | POA: Diagnosis not present

## 2021-06-20 DIAGNOSIS — E785 Hyperlipidemia, unspecified: Secondary | ICD-10-CM | POA: Diagnosis not present

## 2021-06-20 DIAGNOSIS — I2510 Atherosclerotic heart disease of native coronary artery without angina pectoris: Secondary | ICD-10-CM | POA: Diagnosis not present

## 2021-06-20 DIAGNOSIS — I10 Essential (primary) hypertension: Secondary | ICD-10-CM | POA: Diagnosis not present

## 2021-06-20 DIAGNOSIS — G4700 Insomnia, unspecified: Secondary | ICD-10-CM | POA: Diagnosis not present

## 2021-06-20 DIAGNOSIS — R0602 Shortness of breath: Secondary | ICD-10-CM | POA: Diagnosis not present

## 2021-06-20 DIAGNOSIS — L89156 Pressure-induced deep tissue damage of sacral region: Secondary | ICD-10-CM | POA: Diagnosis not present

## 2021-06-20 DIAGNOSIS — I252 Old myocardial infarction: Secondary | ICD-10-CM | POA: Diagnosis not present

## 2021-06-21 DIAGNOSIS — L89323 Pressure ulcer of left buttock, stage 3: Secondary | ICD-10-CM | POA: Diagnosis not present

## 2021-06-21 DIAGNOSIS — M6281 Muscle weakness (generalized): Secondary | ICD-10-CM | POA: Diagnosis not present

## 2021-06-21 DIAGNOSIS — R0602 Shortness of breath: Secondary | ICD-10-CM | POA: Diagnosis not present

## 2021-06-22 ENCOUNTER — Other Ambulatory Visit: Payer: Self-pay | Admitting: Family Medicine

## 2021-06-25 ENCOUNTER — Other Ambulatory Visit: Payer: Self-pay | Admitting: Surgery

## 2021-06-25 DIAGNOSIS — K223 Perforation of esophagus: Secondary | ICD-10-CM

## 2021-06-26 ENCOUNTER — Other Ambulatory Visit: Payer: Self-pay

## 2021-06-26 ENCOUNTER — Ambulatory Visit (INDEPENDENT_AMBULATORY_CARE_PROVIDER_SITE_OTHER): Payer: Self-pay | Admitting: Surgery

## 2021-06-26 ENCOUNTER — Ambulatory Visit
Admission: RE | Admit: 2021-06-26 | Discharge: 2021-06-26 | Disposition: A | Discharge status: Home / Self Care | Payer: Medicare Other | Source: Ambulatory Visit | Attending: Surgery | Admitting: Surgery

## 2021-06-26 VITALS — BP 126/81 | HR 96 | Resp 20 | Ht 61.0 in | Wt 145.0 lb

## 2021-06-26 DIAGNOSIS — K223 Perforation of esophagus: Secondary | ICD-10-CM

## 2021-06-26 DIAGNOSIS — M6281 Muscle weakness (generalized): Secondary | ICD-10-CM | POA: Diagnosis not present

## 2021-06-26 DIAGNOSIS — J869 Pyothorax without fistula: Secondary | ICD-10-CM

## 2021-06-26 DIAGNOSIS — Z09 Encounter for follow-up examination after completed treatment for conditions other than malignant neoplasm: Secondary | ICD-10-CM

## 2021-06-26 DIAGNOSIS — J90 Pleural effusion, not elsewhere classified: Secondary | ICD-10-CM | POA: Diagnosis not present

## 2021-06-26 DIAGNOSIS — Z931 Gastrostomy status: Secondary | ICD-10-CM | POA: Diagnosis not present

## 2021-06-26 DIAGNOSIS — L89323 Pressure ulcer of left buttock, stage 3: Secondary | ICD-10-CM | POA: Diagnosis not present

## 2021-06-28 ENCOUNTER — Encounter: Payer: Self-pay | Admitting: Surgery

## 2021-06-28 DIAGNOSIS — D649 Anemia, unspecified: Secondary | ICD-10-CM | POA: Diagnosis not present

## 2021-06-28 DIAGNOSIS — L89156 Pressure-induced deep tissue damage of sacral region: Secondary | ICD-10-CM | POA: Diagnosis not present

## 2021-06-28 DIAGNOSIS — I2510 Atherosclerotic heart disease of native coronary artery without angina pectoris: Secondary | ICD-10-CM | POA: Diagnosis not present

## 2021-06-28 DIAGNOSIS — H269 Unspecified cataract: Secondary | ICD-10-CM | POA: Diagnosis not present

## 2021-06-28 DIAGNOSIS — I10 Essential (primary) hypertension: Secondary | ICD-10-CM | POA: Diagnosis not present

## 2021-06-28 DIAGNOSIS — I509 Heart failure, unspecified: Secondary | ICD-10-CM | POA: Diagnosis not present

## 2021-06-28 DIAGNOSIS — E119 Type 2 diabetes mellitus without complications: Secondary | ICD-10-CM | POA: Diagnosis not present

## 2021-06-28 DIAGNOSIS — E785 Hyperlipidemia, unspecified: Secondary | ICD-10-CM | POA: Diagnosis not present

## 2021-06-28 DIAGNOSIS — I252 Old myocardial infarction: Secondary | ICD-10-CM | POA: Diagnosis not present

## 2021-06-28 DIAGNOSIS — D34 Benign neoplasm of thyroid gland: Secondary | ICD-10-CM | POA: Diagnosis not present

## 2021-06-28 NOTE — Progress Notes (Signed)
HPI: Patient returns for routine postoperative follow-up having undergone right thoracotomy and repair of a mid esophageal perforation with an intercostal pedicled muscle flap and drainage of an empyema and decortication of the right lung on 05/05/2021. The patient's early postoperative recovery while in the hospital was notable for a slow postoperative recovery.  A follow-up esophagram 7 days postoperatively showed no sign of leak.  She was gradually started back on a diet but had some difficulty taking adequate p.o. nutrition and eventually had to have a percutaneous gastrostomy tube placed.  She was discharged on 06/17/2021 to a skilled nursing facility and has been there since undergoing physical therapy. Since hospital discharge the patient reports that she has been feeling better gradually.  She is taking some p.o. nutrition but still getting tube feedings through her gastrostomy tube.  She denies any chest pain or pressure.  She has had no shortness of breath.  She denies any fever or chills.   Current Outpatient Medications  Medication Sig Dispense Refill   apixaban (ELIQUIS) 5 MG TABS tablet Place 1 tablet (5 mg total) into feeding tube 2 (two) times daily. 60 tablet 1   aspirin EC 81 MG tablet Take 81 mg by mouth in the morning. Swallow whole.     Blood Glucose Monitoring Suppl MISC One touch ultra glucose monitor. Check blood sugar twice a day.     Cholecalciferol (VITAMIN D3) 50 MCG (2000 UT) TABS Take 2,000 Units by mouth in the morning.     glucose blood (ONE TOUCH ULTRA TEST) test strip CHECK BLOOD SUGAR TWICE DAILY 100 each 1   Insulin Pen Needle (B-D UF III MINI PEN NEEDLES) 31G X 5 MM MISC INJECT INSULIN VIA PEN 6 TIMES DAILY 200 each 3   JARDIANCE 10 MG TABS tablet TAKE 1 TABLET BY MOUTH EVERY DAY (Patient taking differently: Take 10 mg by mouth daily.) 90 tablet 3   melatonin 3 MG TABS tablet Place 1 tablet (3 mg total) into feeding tube at bedtime. 30 tablet 1   metFORMIN  (GLUCOPHAGE-XR) 500 MG 24 hr tablet Take 1 tablet (500 mg total) by mouth 2 (two) times daily. 180 tablet 3   metoprolol tartrate (LOPRESSOR) 25 MG tablet Place 1 tablet (25 mg total) into feeding tube 2 (two) times daily. 30 tablet 1   nitroGLYCERIN (NITROSTAT) 0.4 MG SL tablet Place 1 tablet (0.4 mg total) under the tongue every 5 (five) minutes as needed for chest pain. (Patient taking differently: Place 0.4 mg under the tongue every 5 (five) minutes x 3 doses as needed for chest pain.) 25 tablet 1   Nutritional Supplements (FEEDING SUPPLEMENT, GLUCERNA 1.5 CAL,) LIQD Place 1,000 mLs into feeding tube continuous. 1000 mL 0   Nutritional Supplements (FEEDING SUPPLEMENT, PROSOURCE TF,) liquid Place 45 mLs into feeding tube daily. 1350 mL 0   omeprazole (PRILOSEC) 40 MG capsule Take 40 mg by mouth every morning.     ondansetron (ZOFRAN) 4 MG tablet Take 1 tablet (4 mg total) by mouth daily as needed for nausea or vomiting. 30 tablet 1   OneTouch Delica Lancets 99991111 MISC 1 Container by Does not apply route as needed. 100 each PRN   oxyCODONE (OXY IR/ROXICODONE) 5 MG immediate release tablet Place 0.5 tablets (2.5 mg total) into feeding tube every 6 (six) hours as needed for severe pain. 30 tablet 0   polycarbophil (FIBERCON) 625 MG tablet Take 2 tablets (1,250 mg total) by mouth 3 (three) times daily. 30 tablet 1  rosuvastatin (CRESTOR) 20 MG tablet TAKE 1 TABLET BY MOUTH EVERYDAY AT BEDTIME 90 tablet 1   No current facility-administered medications for this visit.    Physical Exam: BP 126/81 (BP Location: Left Arm, Patient Position: Sitting)   Pulse 96   Resp 20   Ht '5\' 1"'$  (1.549 m)   Wt 145 lb (65.8 kg)   SpO2 100% Comment: 3L O2 [place]  BMI 27.40 kg/m  She looks great considering her age and what she went through. She is alert and conversant. Cardiac exam shows regular rate and rhythm with normal heart sounds. Lung exam is clear. The right chest incision is well-healed. There is a  gastrostomy tube in the left abdomen.  She has active bowel sounds.  Abdomen is soft and nontender.  Diagnostic Tests:  CLINICAL DATA:  Esophageal perforation. Postop thoracotomy 05/05/2021   EXAM: CHEST - 2 VIEW   COMPARISON:  06/16/2021   FINDINGS: Cardiac enlargement without heart failure. Bibasilar atelectasis left greater than right similar to the prior study. Right rib fractures again noted. Improvement in right pleural thickening compared to the prior study.   No pneumothorax.  Negative for pneumomediastinum.   IMPRESSION: Mild bibasilar atelectasis unchanged. Improvement in small right pleural effusion.   Negative for pneumothorax or pneumomediastinum.     Electronically Signed   By: Franchot Gallo M.D.   On: 06/26/2021 16:48  Impression:  Overall I think she is making good progress considering her advanced age and preoperative deconditioning related to her cholecystectomy followed by esophageal perforation with extensive contamination of the right pleural space requiring emergent surgery.  Her esophageal perforation healed after repair without leak.  I suspect she probably has some esophageal dysmotility and that may improve with time.  This may have been pre-existing since she appeared to have some esophageal diverticuli on her esophagram.  She has healed up well from her surgery.  I encouraged her to continue trying to eat and participate in physical therapy to get stronger.  Plan:  She will continue to follow-up with her PCP and is not needed return to see me unless there is a problem with her incision.   Gaye Pollack, MD [facility] [phone]

## 2021-07-01 LAB — MAC SUSCEPTIBILITY BROTH
Ciprofloxacin: >8
Clarithromycin: 4
Linezolid: >32
Moxifloxacin: >4
Rifampin: >4
Streptomycin: >32

## 2021-07-01 LAB — FUNGUS CULTURE RESULT

## 2021-07-01 LAB — AFB ORGANISM ID BY DNA PROBE
M avium complex: POSITIVE — AB
M tuberculosis complex: NEGATIVE

## 2021-07-01 LAB — FUNGUS CULTURE WITH STAIN

## 2021-07-01 LAB — ACID FAST SMEAR (AFB, MYCOBACTERIA): Acid Fast Smear: NEGATIVE

## 2021-07-01 LAB — ACID FAST CULTURE WITH REFLEXED SENSITIVITIES (MYCOBACTERIA): Acid Fast Culture: POSITIVE — AB

## 2021-07-01 LAB — FUNGAL ORGANISM REFLEX

## 2021-07-03 DIAGNOSIS — M6281 Muscle weakness (generalized): Secondary | ICD-10-CM | POA: Diagnosis not present

## 2021-07-03 DIAGNOSIS — I5020 Unspecified systolic (congestive) heart failure: Secondary | ICD-10-CM | POA: Diagnosis not present

## 2021-07-03 DIAGNOSIS — L89323 Pressure ulcer of left buttock, stage 3: Secondary | ICD-10-CM | POA: Diagnosis not present

## 2021-07-05 DIAGNOSIS — E46 Unspecified protein-calorie malnutrition: Secondary | ICD-10-CM | POA: Diagnosis not present

## 2021-07-05 DIAGNOSIS — L89156 Pressure-induced deep tissue damage of sacral region: Secondary | ICD-10-CM | POA: Diagnosis not present

## 2021-07-05 DIAGNOSIS — E119 Type 2 diabetes mellitus without complications: Secondary | ICD-10-CM | POA: Diagnosis not present

## 2021-07-05 DIAGNOSIS — E785 Hyperlipidemia, unspecified: Secondary | ICD-10-CM | POA: Diagnosis not present

## 2021-07-05 DIAGNOSIS — I2510 Atherosclerotic heart disease of native coronary artery without angina pectoris: Secondary | ICD-10-CM | POA: Diagnosis not present

## 2021-07-05 DIAGNOSIS — R5381 Other malaise: Secondary | ICD-10-CM | POA: Diagnosis not present

## 2021-07-05 DIAGNOSIS — G4700 Insomnia, unspecified: Secondary | ICD-10-CM | POA: Diagnosis not present

## 2021-07-05 DIAGNOSIS — I10 Essential (primary) hypertension: Secondary | ICD-10-CM | POA: Diagnosis not present

## 2021-07-05 DIAGNOSIS — I252 Old myocardial infarction: Secondary | ICD-10-CM | POA: Diagnosis not present

## 2021-07-10 DIAGNOSIS — L89323 Pressure ulcer of left buttock, stage 3: Secondary | ICD-10-CM | POA: Diagnosis not present

## 2021-07-10 DIAGNOSIS — I2510 Atherosclerotic heart disease of native coronary artery without angina pectoris: Secondary | ICD-10-CM | POA: Diagnosis not present

## 2021-07-10 DIAGNOSIS — E46 Unspecified protein-calorie malnutrition: Secondary | ICD-10-CM | POA: Diagnosis not present

## 2021-07-10 DIAGNOSIS — E119 Type 2 diabetes mellitus without complications: Secondary | ICD-10-CM | POA: Diagnosis not present

## 2021-07-10 DIAGNOSIS — M6281 Muscle weakness (generalized): Secondary | ICD-10-CM | POA: Diagnosis not present

## 2021-07-10 DIAGNOSIS — H269 Unspecified cataract: Secondary | ICD-10-CM | POA: Diagnosis not present

## 2021-07-10 DIAGNOSIS — I2699 Other pulmonary embolism without acute cor pulmonale: Secondary | ICD-10-CM | POA: Diagnosis not present

## 2021-07-10 DIAGNOSIS — G4700 Insomnia, unspecified: Secondary | ICD-10-CM | POA: Diagnosis not present

## 2021-07-10 DIAGNOSIS — I10 Essential (primary) hypertension: Secondary | ICD-10-CM | POA: Diagnosis not present

## 2021-07-10 DIAGNOSIS — R0602 Shortness of breath: Secondary | ICD-10-CM | POA: Diagnosis not present

## 2021-07-10 DIAGNOSIS — E785 Hyperlipidemia, unspecified: Secondary | ICD-10-CM | POA: Diagnosis not present

## 2021-07-10 DIAGNOSIS — I5020 Unspecified systolic (congestive) heart failure: Secondary | ICD-10-CM | POA: Diagnosis not present

## 2021-07-10 DIAGNOSIS — R5381 Other malaise: Secondary | ICD-10-CM | POA: Diagnosis not present

## 2021-07-10 DIAGNOSIS — D34 Benign neoplasm of thyroid gland: Secondary | ICD-10-CM | POA: Diagnosis not present

## 2021-07-10 DIAGNOSIS — I482 Chronic atrial fibrillation, unspecified: Secondary | ICD-10-CM | POA: Diagnosis not present

## 2021-07-12 ENCOUNTER — Other Ambulatory Visit: Payer: Self-pay | Admitting: Family Medicine

## 2021-07-12 DIAGNOSIS — I10 Essential (primary) hypertension: Secondary | ICD-10-CM

## 2021-07-17 DIAGNOSIS — I2510 Atherosclerotic heart disease of native coronary artery without angina pectoris: Secondary | ICD-10-CM | POA: Diagnosis not present

## 2021-07-17 DIAGNOSIS — E46 Unspecified protein-calorie malnutrition: Secondary | ICD-10-CM | POA: Diagnosis not present

## 2021-07-17 DIAGNOSIS — R0602 Shortness of breath: Secondary | ICD-10-CM | POA: Diagnosis not present

## 2021-07-17 DIAGNOSIS — E785 Hyperlipidemia, unspecified: Secondary | ICD-10-CM | POA: Diagnosis not present

## 2021-07-17 DIAGNOSIS — R5381 Other malaise: Secondary | ICD-10-CM | POA: Diagnosis not present

## 2021-07-17 DIAGNOSIS — G4700 Insomnia, unspecified: Secondary | ICD-10-CM | POA: Diagnosis not present

## 2021-07-17 DIAGNOSIS — I10 Essential (primary) hypertension: Secondary | ICD-10-CM | POA: Diagnosis not present

## 2021-07-17 DIAGNOSIS — I509 Heart failure, unspecified: Secondary | ICD-10-CM | POA: Diagnosis not present

## 2021-07-17 DIAGNOSIS — I252 Old myocardial infarction: Secondary | ICD-10-CM | POA: Diagnosis not present

## 2021-07-17 DIAGNOSIS — E119 Type 2 diabetes mellitus without complications: Secondary | ICD-10-CM | POA: Diagnosis not present

## 2021-07-17 DIAGNOSIS — D34 Benign neoplasm of thyroid gland: Secondary | ICD-10-CM | POA: Diagnosis not present

## 2021-07-19 DIAGNOSIS — Z86711 Personal history of pulmonary embolism: Secondary | ICD-10-CM | POA: Diagnosis not present

## 2021-07-19 DIAGNOSIS — G4700 Insomnia, unspecified: Secondary | ICD-10-CM | POA: Diagnosis not present

## 2021-07-19 DIAGNOSIS — M8580 Other specified disorders of bone density and structure, unspecified site: Secondary | ICD-10-CM | POA: Diagnosis not present

## 2021-07-19 DIAGNOSIS — Z7901 Long term (current) use of anticoagulants: Secondary | ICD-10-CM | POA: Diagnosis not present

## 2021-07-19 DIAGNOSIS — I2510 Atherosclerotic heart disease of native coronary artery without angina pectoris: Secondary | ICD-10-CM | POA: Diagnosis not present

## 2021-07-19 DIAGNOSIS — Z9181 History of falling: Secondary | ICD-10-CM | POA: Diagnosis not present

## 2021-07-19 DIAGNOSIS — I5020 Unspecified systolic (congestive) heart failure: Secondary | ICD-10-CM | POA: Diagnosis not present

## 2021-07-19 DIAGNOSIS — J90 Pleural effusion, not elsewhere classified: Secondary | ICD-10-CM | POA: Diagnosis not present

## 2021-07-19 DIAGNOSIS — E43 Unspecified severe protein-calorie malnutrition: Secondary | ICD-10-CM | POA: Diagnosis not present

## 2021-07-19 DIAGNOSIS — Z7984 Long term (current) use of oral hypoglycemic drugs: Secondary | ICD-10-CM | POA: Diagnosis not present

## 2021-07-19 DIAGNOSIS — E1136 Type 2 diabetes mellitus with diabetic cataract: Secondary | ICD-10-CM | POA: Diagnosis not present

## 2021-07-19 DIAGNOSIS — D649 Anemia, unspecified: Secondary | ICD-10-CM | POA: Diagnosis not present

## 2021-07-19 DIAGNOSIS — I4891 Unspecified atrial fibrillation: Secondary | ICD-10-CM | POA: Diagnosis not present

## 2021-07-19 DIAGNOSIS — I252 Old myocardial infarction: Secondary | ICD-10-CM | POA: Diagnosis not present

## 2021-07-19 DIAGNOSIS — Z993 Dependence on wheelchair: Secondary | ICD-10-CM | POA: Diagnosis not present

## 2021-07-19 DIAGNOSIS — M722 Plantar fascial fibromatosis: Secondary | ICD-10-CM | POA: Diagnosis not present

## 2021-07-19 DIAGNOSIS — Z931 Gastrostomy status: Secondary | ICD-10-CM | POA: Diagnosis not present

## 2021-07-19 DIAGNOSIS — Z794 Long term (current) use of insulin: Secondary | ICD-10-CM | POA: Diagnosis not present

## 2021-07-19 DIAGNOSIS — Z7982 Long term (current) use of aspirin: Secondary | ICD-10-CM | POA: Diagnosis not present

## 2021-07-19 DIAGNOSIS — Z9981 Dependence on supplemental oxygen: Secondary | ICD-10-CM | POA: Diagnosis not present

## 2021-07-19 DIAGNOSIS — N179 Acute kidney failure, unspecified: Secondary | ICD-10-CM | POA: Diagnosis not present

## 2021-07-19 DIAGNOSIS — K219 Gastro-esophageal reflux disease without esophagitis: Secondary | ICD-10-CM | POA: Diagnosis not present

## 2021-07-19 DIAGNOSIS — I110 Hypertensive heart disease with heart failure: Secondary | ICD-10-CM | POA: Diagnosis not present

## 2021-07-19 DIAGNOSIS — E785 Hyperlipidemia, unspecified: Secondary | ICD-10-CM | POA: Diagnosis not present

## 2021-07-22 ENCOUNTER — Inpatient Hospital Stay (HOSPITAL_COMMUNITY)
Admission: EM | Admit: 2021-07-22 | Discharge: 2021-07-26 | DRG: 291 | Disposition: A | Discharge status: Home Health | Payer: Medicare Other | Attending: Family Medicine | Admitting: Family Medicine

## 2021-07-22 ENCOUNTER — Telehealth: Payer: Self-pay | Admitting: Family Medicine

## 2021-07-22 ENCOUNTER — Other Ambulatory Visit: Payer: Self-pay

## 2021-07-22 ENCOUNTER — Emergency Department (HOSPITAL_COMMUNITY): Payer: Medicare Other

## 2021-07-22 ENCOUNTER — Encounter (HOSPITAL_COMMUNITY): Payer: Self-pay

## 2021-07-22 DIAGNOSIS — M8580 Other specified disorders of bone density and structure, unspecified site: Secondary | ICD-10-CM | POA: Diagnosis present

## 2021-07-22 DIAGNOSIS — R0602 Shortness of breath: Secondary | ICD-10-CM | POA: Diagnosis not present

## 2021-07-22 DIAGNOSIS — R627 Adult failure to thrive: Secondary | ICD-10-CM | POA: Diagnosis present

## 2021-07-22 DIAGNOSIS — Z789 Other specified health status: Secondary | ICD-10-CM | POA: Diagnosis not present

## 2021-07-22 DIAGNOSIS — E43 Unspecified severe protein-calorie malnutrition: Secondary | ICD-10-CM | POA: Diagnosis present

## 2021-07-22 DIAGNOSIS — I517 Cardiomegaly: Secondary | ICD-10-CM | POA: Diagnosis not present

## 2021-07-22 DIAGNOSIS — I110 Hypertensive heart disease with heart failure: Secondary | ICD-10-CM | POA: Diagnosis not present

## 2021-07-22 DIAGNOSIS — F32A Depression, unspecified: Secondary | ICD-10-CM | POA: Diagnosis not present

## 2021-07-22 DIAGNOSIS — I480 Paroxysmal atrial fibrillation: Secondary | ICD-10-CM | POA: Diagnosis present

## 2021-07-22 DIAGNOSIS — Z931 Gastrostomy status: Secondary | ICD-10-CM

## 2021-07-22 DIAGNOSIS — J9811 Atelectasis: Secondary | ICD-10-CM | POA: Diagnosis not present

## 2021-07-22 DIAGNOSIS — E890 Postprocedural hypothyroidism: Secondary | ICD-10-CM | POA: Diagnosis present

## 2021-07-22 DIAGNOSIS — Z8249 Family history of ischemic heart disease and other diseases of the circulatory system: Secondary | ICD-10-CM | POA: Diagnosis not present

## 2021-07-22 DIAGNOSIS — Z794 Long term (current) use of insulin: Secondary | ICD-10-CM

## 2021-07-22 DIAGNOSIS — R778 Other specified abnormalities of plasma proteins: Secondary | ICD-10-CM

## 2021-07-22 DIAGNOSIS — I2510 Atherosclerotic heart disease of native coronary artery without angina pectoris: Secondary | ICD-10-CM | POA: Diagnosis present

## 2021-07-22 DIAGNOSIS — E785 Hyperlipidemia, unspecified: Secondary | ICD-10-CM | POA: Diagnosis present

## 2021-07-22 DIAGNOSIS — R0600 Dyspnea, unspecified: Secondary | ICD-10-CM

## 2021-07-22 DIAGNOSIS — E440 Moderate protein-calorie malnutrition: Secondary | ICD-10-CM | POA: Diagnosis not present

## 2021-07-22 DIAGNOSIS — Z20822 Contact with and (suspected) exposure to covid-19: Secondary | ICD-10-CM | POA: Diagnosis present

## 2021-07-22 DIAGNOSIS — I252 Old myocardial infarction: Secondary | ICD-10-CM | POA: Diagnosis not present

## 2021-07-22 DIAGNOSIS — R079 Chest pain, unspecified: Secondary | ICD-10-CM | POA: Diagnosis not present

## 2021-07-22 DIAGNOSIS — Z7901 Long term (current) use of anticoagulants: Secondary | ICD-10-CM | POA: Diagnosis not present

## 2021-07-22 DIAGNOSIS — Z7982 Long term (current) use of aspirin: Secondary | ICD-10-CM | POA: Diagnosis not present

## 2021-07-22 DIAGNOSIS — R748 Abnormal levels of other serum enzymes: Secondary | ICD-10-CM | POA: Diagnosis not present

## 2021-07-22 DIAGNOSIS — Z809 Family history of malignant neoplasm, unspecified: Secondary | ICD-10-CM

## 2021-07-22 DIAGNOSIS — R269 Unspecified abnormalities of gait and mobility: Secondary | ICD-10-CM | POA: Diagnosis present

## 2021-07-22 DIAGNOSIS — Z853 Personal history of malignant neoplasm of breast: Secondary | ICD-10-CM

## 2021-07-22 DIAGNOSIS — K219 Gastro-esophageal reflux disease without esophagitis: Secondary | ICD-10-CM | POA: Diagnosis present

## 2021-07-22 DIAGNOSIS — I5023 Acute on chronic systolic (congestive) heart failure: Secondary | ICD-10-CM | POA: Diagnosis not present

## 2021-07-22 DIAGNOSIS — N281 Cyst of kidney, acquired: Secondary | ICD-10-CM | POA: Diagnosis not present

## 2021-07-22 DIAGNOSIS — Z833 Family history of diabetes mellitus: Secondary | ICD-10-CM

## 2021-07-22 DIAGNOSIS — Z8674 Personal history of sudden cardiac arrest: Secondary | ICD-10-CM

## 2021-07-22 DIAGNOSIS — R601 Generalized edema: Secondary | ICD-10-CM | POA: Diagnosis not present

## 2021-07-22 DIAGNOSIS — R110 Nausea: Secondary | ICD-10-CM | POA: Diagnosis not present

## 2021-07-22 DIAGNOSIS — G4700 Insomnia, unspecified: Secondary | ICD-10-CM | POA: Diagnosis not present

## 2021-07-22 DIAGNOSIS — E119 Type 2 diabetes mellitus without complications: Secondary | ICD-10-CM | POA: Diagnosis not present

## 2021-07-22 DIAGNOSIS — Z7984 Long term (current) use of oral hypoglycemic drugs: Secondary | ICD-10-CM | POA: Diagnosis not present

## 2021-07-22 DIAGNOSIS — R0609 Other forms of dyspnea: Secondary | ICD-10-CM | POA: Diagnosis present

## 2021-07-22 DIAGNOSIS — I700 Atherosclerosis of aorta: Secondary | ICD-10-CM | POA: Diagnosis not present

## 2021-07-22 DIAGNOSIS — Z79899 Other long term (current) drug therapy: Secondary | ICD-10-CM | POA: Diagnosis not present

## 2021-07-22 DIAGNOSIS — Z7409 Other reduced mobility: Secondary | ICD-10-CM | POA: Diagnosis not present

## 2021-07-22 DIAGNOSIS — Z823 Family history of stroke: Secondary | ICD-10-CM | POA: Diagnosis not present

## 2021-07-22 DIAGNOSIS — M545 Low back pain, unspecified: Secondary | ICD-10-CM | POA: Diagnosis present

## 2021-07-22 DIAGNOSIS — Z6825 Body mass index (BMI) 25.0-25.9, adult: Secondary | ICD-10-CM

## 2021-07-22 DIAGNOSIS — I5020 Unspecified systolic (congestive) heart failure: Secondary | ICD-10-CM | POA: Diagnosis not present

## 2021-07-22 DIAGNOSIS — I2699 Other pulmonary embolism without acute cor pulmonale: Secondary | ICD-10-CM | POA: Diagnosis not present

## 2021-07-22 DIAGNOSIS — Z8719 Personal history of other diseases of the digestive system: Secondary | ICD-10-CM

## 2021-07-22 DIAGNOSIS — Z9049 Acquired absence of other specified parts of digestive tract: Secondary | ICD-10-CM

## 2021-07-22 DIAGNOSIS — Z8541 Personal history of malignant neoplasm of cervix uteri: Secondary | ICD-10-CM

## 2021-07-22 DIAGNOSIS — R1110 Vomiting, unspecified: Secondary | ICD-10-CM | POA: Diagnosis not present

## 2021-07-22 DIAGNOSIS — Z86711 Personal history of pulmonary embolism: Secondary | ICD-10-CM

## 2021-07-22 DIAGNOSIS — J90 Pleural effusion, not elsewhere classified: Secondary | ICD-10-CM | POA: Diagnosis not present

## 2021-07-22 HISTORY — DX: Adult failure to thrive: R62.7

## 2021-07-22 LAB — COMPREHENSIVE METABOLIC PANEL
ALT: 11 U/L (ref 0–44)
AST: 23 U/L (ref 15–41)
Albumin: 3.1 g/dL — ABNORMAL LOW (ref 3.5–5.0)
Alkaline Phosphatase: 52 U/L (ref 38–126)
Anion gap: 10 (ref 5–15)
BUN: 8 mg/dL (ref 8–23)
CO2: 23 mmol/L (ref 22–32)
Calcium: 9.7 mg/dL (ref 8.9–10.3)
Chloride: 108 mmol/L (ref 98–111)
Creatinine, Ser: 0.83 mg/dL (ref 0.44–1.00)
GFR, Estimated: >60 mL/min (ref >60)
Glucose, Bld: 161 mg/dL — ABNORMAL HIGH (ref 70–99)
Potassium: 4.2 mmol/L (ref 3.5–5.1)
Sodium: 141 mmol/L (ref 135–145)
Total Bilirubin: 0.7 mg/dL (ref 0.3–1.2)
Total Protein: 7 g/dL (ref 6.5–8.1)

## 2021-07-22 LAB — TROPONIN I (HIGH SENSITIVITY)
Troponin I (High Sensitivity): 195 ng/L (ref <18)
Troponin I (High Sensitivity): 218 ng/L (ref <18)

## 2021-07-22 LAB — RESP PANEL BY RT-PCR (FLU A&B, COVID) ARPGX2
Influenza A by PCR: NEGATIVE
Influenza B by PCR: NEGATIVE
SARS Coronavirus 2 by RT PCR: NEGATIVE

## 2021-07-22 LAB — CBC
HCT: 39.8 % (ref 36.0–46.0)
Hemoglobin: 12 g/dL (ref 12.0–15.0)
MCH: 27 pg (ref 26.0–34.0)
MCHC: 30.2 g/dL (ref 30.0–36.0)
MCV: 89.6 fL (ref 80.0–100.0)
Platelets: 201 10*3/uL (ref 150–400)
RBC: 4.44 MIL/uL (ref 3.87–5.11)
RDW: 17.6 % — ABNORMAL HIGH (ref 11.5–15.5)
WBC: 6.2 10*3/uL (ref 4.0–10.5)
nRBC: 0 % (ref 0.0–0.2)

## 2021-07-22 LAB — LIPASE, BLOOD: Lipase: 22 U/L (ref 11–51)

## 2021-07-22 MED ORDER — APIXABAN 5 MG PO TABS
5.0000 mg | ORAL_TABLET | Freq: Two times a day (BID) | ORAL | Status: DC
  Administered 2021-07-23 – 2021-07-26 (×7): 5 mg via ORAL
  Filled 2021-07-22 (×7): qty 1

## 2021-07-22 MED ORDER — EMPAGLIFLOZIN 10 MG PO TABS
10.0000 mg | ORAL_TABLET | Freq: Every day | ORAL | Status: DC
  Administered 2021-07-23 – 2021-07-26 (×4): 10 mg via ORAL
  Filled 2021-07-22 (×5): qty 1

## 2021-07-22 MED ORDER — ONDANSETRON HCL 4 MG/2ML IJ SOLN
4.0000 mg | Freq: Once | INTRAMUSCULAR | Status: AC
  Administered 2021-07-22: 4 mg via INTRAVENOUS
  Filled 2021-07-22: qty 2

## 2021-07-22 MED ORDER — ASPIRIN EC 81 MG PO TBEC
81.0000 mg | DELAYED_RELEASE_TABLET | Freq: Every day | ORAL | Status: DC
  Administered 2021-07-23 – 2021-07-26 (×4): 81 mg via ORAL
  Filled 2021-07-22 (×4): qty 1

## 2021-07-22 MED ORDER — INSULIN ASPART 100 UNIT/ML IJ SOLN
0.0000 [IU] | Freq: Three times a day (TID) | INTRAMUSCULAR | Status: DC
  Administered 2021-07-23 – 2021-07-24 (×3): 1 [IU] via SUBCUTANEOUS
  Administered 2021-07-25: 2 [IU] via SUBCUTANEOUS

## 2021-07-22 MED ORDER — METOPROLOL TARTRATE 25 MG PO TABS
25.0000 mg | ORAL_TABLET | Freq: Two times a day (BID) | ORAL | Status: DC
  Administered 2021-07-23 – 2021-07-25 (×6): 25 mg via ORAL
  Filled 2021-07-22 (×7): qty 1

## 2021-07-22 MED ORDER — ROSUVASTATIN CALCIUM 20 MG PO TABS
20.0000 mg | ORAL_TABLET | Freq: Every day | ORAL | Status: DC
  Administered 2021-07-23 – 2021-07-26 (×4): 20 mg via ORAL
  Filled 2021-07-22 (×4): qty 1

## 2021-07-22 MED ORDER — SODIUM CHLORIDE 0.9% FLUSH
3.0000 mL | Freq: Two times a day (BID) | INTRAVENOUS | Status: DC
  Administered 2021-07-23 – 2021-07-26 (×7): 3 mL via INTRAVENOUS

## 2021-07-22 MED ORDER — IOHEXOL 350 MG/ML SOLN
100.0000 mL | Freq: Once | INTRAVENOUS | Status: AC | PRN
  Administered 2021-07-22: 100 mL via INTRAVENOUS

## 2021-07-22 MED ORDER — SACUBITRIL-VALSARTAN 24-26 MG PO TABS
1.0000 | ORAL_TABLET | Freq: Two times a day (BID) | ORAL | Status: DC
  Administered 2021-07-23 – 2021-07-26 (×7): 1 via ORAL
  Filled 2021-07-22 (×8): qty 1

## 2021-07-22 MED ORDER — FUROSEMIDE 20 MG PO TABS: 20.0000 mg | ORAL_TABLET | Freq: Every day | ORAL | Status: DC

## 2021-07-22 MED ORDER — METFORMIN HCL 500 MG PO TABS
500.0000 mg | ORAL_TABLET | Freq: Two times a day (BID) | ORAL | Status: DC
  Administered 2021-07-25: 500 mg via ORAL
  Filled 2021-07-22: qty 1

## 2021-07-22 MED ORDER — SODIUM CHLORIDE 0.9 % IV SOLN: INTRAVENOUS | Status: DC

## 2021-07-22 MED ORDER — TRAZODONE HCL 50 MG PO TABS: 50.0000 mg | ORAL_TABLET | ORAL | Status: DC | PRN

## 2021-07-22 MED ORDER — PANTOPRAZOLE SODIUM 40 MG PO TBEC
80.0000 mg | DELAYED_RELEASE_TABLET | Freq: Every day | ORAL | Status: DC
  Administered 2021-07-23 – 2021-07-25 (×3): 80 mg via ORAL
  Filled 2021-07-22 (×3): qty 2

## 2021-07-22 MED ORDER — ONDANSETRON HCL 4 MG PO TABS: 4.0000 mg | ORAL_TABLET | Freq: Four times a day (QID) | ORAL | Status: DC | PRN

## 2021-07-22 MED ORDER — AMIODARONE HCL 200 MG PO TABS
200.0000 mg | ORAL_TABLET | Freq: Every day | ORAL | Status: DC
  Administered 2021-07-23 – 2021-07-26 (×4): 200 mg via ORAL
  Filled 2021-07-22 (×4): qty 1

## 2021-07-22 MED ORDER — NITROGLYCERIN 0.4 MG SL SUBL: 0.4000 mg | SUBLINGUAL_TABLET | SUBLINGUAL | Status: DC | PRN

## 2021-07-22 MED ORDER — SODIUM CHLORIDE 0.9 % IV BOLUS
1000.0000 mL | Freq: Once | INTRAVENOUS | Status: AC
  Administered 2021-07-22: 1000 mL via INTRAVENOUS

## 2021-07-22 NOTE — ED Triage Notes (Signed)
Patient complains of SOB and palpitations for the past month. Patient also reports feeding tube and states vomiting the meds back up. Patient alert and oriented, denies pain

## 2021-07-22 NOTE — ED Provider Notes (Signed)
[facility] EMERGENCY DEPARTMENT Provider Note   CSN: [id number] Arrival date & time: 07/22/21  1222     History No chief complaint on file.   Dana Coleman is a 80 y.o. female.  80 yo F with a chief complaints of nausea and vomiting and not having oxygen.  The patient thinks that she was post to be on home O2.  Was on oxygen when she was discharged from the hospital and was told when she left her rehab facility that she would have oxygen delivered to her house.  She was discharged on Friday and has never had oxygen delivered.  She also has had nausea and vomiting.  Has been using her feeding tube and has had some nausea vomiting without as well.  She does think that she is passing gas and is having stools.  Denies any abdominal discomfort denies any significant chest pain.  The history is provided by the patient.  Illness Severity:  Moderate Onset quality:  Gradual Duration:  2 days Timing:  Constant Progression:  Worsening Chronicity:  New Associated symptoms: nausea and vomiting   Associated symptoms: no chest pain, no congestion, no fever, no headaches, no myalgias, no rhinorrhea, no shortness of breath and no wheezing       Past Medical History:  Diagnosis Date   Allergy    occ uses OTC allergy meds    Arthritis    fingers   Breast cancer ([place]) 04/2008   Right breast   Cataracts, bilateral    removed bilat    Cervical cancer ([place])    When the patient was in her 19s   Depression    Diabetes mellitus without complication ([place])    Heart attack ([place]) 2004   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   Hyperlipidemia    Hypertension    Personal history of radiation therapy 2009   Respiratory failure ([place]) 05/04/2021    Patient Active Problem List   Diagnosis Date Noted   Failure to thrive in adult 07/22/2021   Abdominal pain    Dyspnea    On total parenteral nutrition (TPN) 05/25/2021   Impaired functional mobility, balance, gait, and endurance  05/25/2021   S/P thoracotomy    NSTEMI (non-ST elevated myocardial infarction) ([place]) 05/23/2021   Heart failure with reduced ejection fraction ([place]) 05/23/2021   Empyema ([place])    Acute pulmonary embolism ([place])    Acute respiratory failure with hypoxemia (HCC)    Acute pulmonary edema (HCC)    Acute respiratory failure with hypoxia (HCC)    Acute renal failure (HCC)    Hydropneumothorax    Protein-calorie malnutrition, severe 05/15/2021   Pressure injury of skin 05/05/2021   Esophageal perforation 05/05/2021   Vaginal itching 03/25/2021   Left hip pain 02/12/2021   GERD (gastroesophageal reflux disease) 12/19/2020   Neck muscle spasm 02/16/2019   Breast lump 12/07/2018   Plantar fasciitis, right 09/30/2018   Plantar wart 11/17/2017   Insulin dependent type 2 diabetes mellitus, uncontrolled ([place]) 08/14/2014   Debility 06/30/2011   History of thyroid nodule 01/16/2011   History of breast cancer (right)  stage 1 ERA positive    History of cervical cancer    Hyperlipidemia    Hypertension    CAD (coronary artery disease), native coronary artery    Osteopenia 12/10/2006    Past Surgical History:  Procedure Laterality Date   BREAST LUMPECTOMY Right 2009   BREAST SURGERY  2009   right lumpectomy   CERVIX SURGERY  1980   CHOLECYSTECTOMY N/A 05/02/2021   Procedure: LAPAROSCOPIC CHOLECYSTECTOMY;  Surgeon: Greer Pickerel, MD;  Location: [facility] ORS;  Service: General;  Laterality: N/A;   COLONOSCOPY     IR GASTROSTOMY TUBE MOD SED  06/03/2021   LEFT HEART CATHETERIZATION WITH CORONARY ANGIOGRAM N/A 04/20/2014   Procedure: LEFT HEART CATHETERIZATION WITH CORONARY ANGIOGRAM;  Surgeon: Jacolyn Reedy, MD;  Location: [facility] CATH LAB;  Service: Cardiovascular;  Laterality: N/A;   POLYPECTOMY     STENT PLACEMENT VASCULAR ([place] HX)     THORACOTOMY Right 05/05/2021   Procedure: THORACOTOMY MAJOR REPAIR PERFORATED ESOPHAGUS;  Surgeon: Gaye Pollack, MD;  Location: [facility] OR;  Service: Thoracic;   Laterality: Right;   thyroid     for nodule hemithyroidectomy, benign   THYROIDECTOMY Right 03/2003     OB History   No obstetric history on file.     Family History  Problem Relation Age of Onset   Heart disease Mother        age 25   Heart disease Father    Cancer Father        unknown   Heart disease Brother    Hypertension Brother    Diabetes Daughter    Stroke Daughter    Cirrhosis Brother    Colon cancer Neg Hx    Colon polyps Neg Hx    Esophageal cancer Neg Hx    Stomach cancer Neg Hx    Rectal cancer Neg Hx     Social History   Tobacco Use   Smoking status: Never   Smokeless tobacco: Never   Tobacco comments:    No plans to start  Vaping Use   Vaping Use: Never used  Substance Use Topics   Alcohol use: No   Drug use: No    Home Medications Prior to Admission medications   Medication Sig Start Date End Date Taking? Authorizing Provider  amiodarone (PACERONE) 200 MG tablet Place 200 mg into feeding tube daily. 07/20/21  Yes [provider]  apixaban (ELIQUIS) 5 MG TABS tablet Place 1 tablet (5 mg total) into feeding tube 2 (two) times daily. 06/17/21 07/22/21 Yes Ganta, Anupa, DO  aspirin EC 81 MG tablet Take 81 mg by mouth in the morning. Swallow whole.   Yes [provider]  cholecalciferol (VITAMIN D) 25 MCG (1000 UNIT) tablet Take 1,000 Units by mouth in the morning.   Yes [provider]  ENTRESTO 24-26 MG Take 1 tablet by mouth 2 (two) times daily. 07/20/21  Yes [provider]  furosemide (LASIX) 20 MG tablet Take 20 mg by mouth daily. 07/20/21  Yes [provider]  JARDIANCE 10 MG TABS tablet TAKE 1 TABLET BY MOUTH EVERY DAY Patient taking differently: Take 10 mg by mouth daily. 12/05/20  Yes Gifford Shave, MD  Melatonin 3 MG TBDP Take 6 mg by mouth at bedtime. 07/20/21  Yes [provider]  metFORMIN (GLUCOPHAGE) 500 MG tablet Place 500 mg into feeding tube 2 (two) times daily. 07/20/21  Yes  [provider]  metoprolol tartrate (LOPRESSOR) 25 MG tablet Place 1 tablet (25 mg total) into feeding tube 2 (two) times daily. 06/17/21  Yes Ganta, Anupa, DO  nitroGLYCERIN (NITROSTAT) 0.4 MG SL tablet Place 1 tablet (0.4 mg total) under the tongue every 5 (five) minutes as needed for chest pain. Patient taking differently: Place 0.4 mg under the tongue every 5 (five) minutes x 3 doses as needed for chest pain. 07/12/19  Yes Madison Hickman  A, MD  omeprazole (PRILOSEC) 20 MG capsule Take 40 mg by mouth every morning. 12/12/20  Yes [provider]  ondansetron (ZOFRAN) 4 MG tablet Take 1 tablet (4 mg total) by mouth daily as needed for nausea or vomiting. 06/17/21 06/17/22 Yes Ganta, Anupa, DO  oxyCODONE (OXY IR/ROXICODONE) 5 MG immediate release tablet Place 0.5 tablets (2.5 mg total) into feeding tube every 6 (six) hours as needed for severe pain. 06/17/21  Yes Ganta, Anupa, DO  potassium chloride (KLOR-CON) 10 MEQ tablet Take 10 mEq by mouth daily. 07/20/21  Yes [provider]  rosuvastatin (CRESTOR) 20 MG tablet TAKE 1 TABLET BY MOUTH EVERYDAY AT BEDTIME Patient taking differently: Take 20 mg by mouth daily. 06/07/21  Yes Gifford Shave, MD  traZODone (DESYREL) 50 MG tablet Take 50 mg by mouth as needed for sleep. 07/20/21  Yes [provider]  Blood Glucose Monitoring Suppl MISC One touch ultra glucose monitor. Check blood sugar twice a day.    [provider]  glucose blood (ONE TOUCH ULTRA TEST) test strip CHECK BLOOD SUGAR TWICE DAILY 09/24/18   Everrett Coombe, MD  Insulin Pen Needle (B-D UF III MINI PEN NEEDLES) 31G X 5 MM MISC INJECT INSULIN VIA PEN 6 TIMES DAILY 02/22/18   Everrett Coombe, MD  metFORMIN (GLUCOPHAGE-XR) 500 MG 24 hr tablet Take 1 tablet (500 mg total) by mouth 2 (two) times daily. Patient not taking: No sig reported 06/24/21   Gifford Shave, MD  OneTouch Delica Lancets 35T MISC 1 Container by Does not apply route as needed. 06/07/19   Zenia Resides, MD  polycarbophil (FIBERCON) 625 MG tablet Take 2 tablets (1,250 mg total) by mouth 3 (three) times daily. Patient not taking: No sig reported 06/17/21   Donney Dice, DO    Allergies    Patient has no known allergies.  Review of Systems   Review of Systems  Constitutional:  Negative for chills and fever.  HENT:  Negative for congestion and rhinorrhea.   Eyes:  Negative for redness and visual disturbance.  Respiratory:  Negative for shortness of breath and wheezing.   Cardiovascular:  Negative for chest pain and palpitations.  Gastrointestinal:  Positive for nausea and vomiting.  Genitourinary:  Negative for dysuria and urgency.  Musculoskeletal:  Negative for arthralgias and myalgias.  Skin:  Negative for pallor and wound.  Neurological:  Negative for dizziness and headaches.   Physical Exam Updated Vital Signs BP 137/88   Pulse 91   Temp 98.4 F (36.9 C) (Oral)   Resp 19   SpO2 92%   Physical Exam Vitals and nursing note reviewed.  Constitutional:      General: She is not in acute distress.    Appearance: She is well-developed. She is not diaphoretic.  HENT:     Head: Normocephalic and atraumatic.  Eyes:     Pupils: Pupils are equal, round, and reactive to light.  Cardiovascular:     Rate and Rhythm: Normal rate and regular rhythm.     Heart sounds: No murmur heard.   No friction rub. No gallop.  Pulmonary:     Effort: Pulmonary effort is normal.     Breath sounds: No wheezing or rales.  Abdominal:     General: There is no distension.     Palpations: Abdomen is soft.     Tenderness: There is no abdominal tenderness.     Comments: G-tube in place with some drainage inferiorly.  No obvious surrounding erythema no induration  no fluctuance.  No obvious abdominal discomfort or distention.  Musculoskeletal:        General: No tenderness.     Cervical back: Normal range of motion and neck supple.  Skin:    General: Skin is warm and dry.  Neurological:      Mental Status: She is alert and oriented to person, place, and time.  Psychiatric:        Behavior: Behavior normal.    ED Results / Procedures / Treatments   Labs (all labs ordered are listed, but only abnormal results are displayed) Labs Reviewed  CBC - Abnormal; Notable for the following components:      Result Value   RDW 17.6 (*)    All other components within normal limits  COMPREHENSIVE METABOLIC PANEL - Abnormal; Notable for the following components:   Glucose, Bld 161 (*)    Albumin 3.1 (*)    All other components within normal limits  COMPREHENSIVE METABOLIC PANEL - Abnormal; Notable for the following components:   Glucose, Bld 117 (*)    BUN 6 (*)    Total Protein 6.1 (*)    Albumin 2.6 (*)    All other components within normal limits  CBC - Abnormal; Notable for the following components:   Hemoglobin 10.9 (*)    HCT 35.2 (*)    RDW 17.5 (*)    All other components within normal limits  BRAIN NATRIURETIC PEPTIDE - Abnormal; Notable for the following components:   B Natriuretic Peptide 3,647.8 (*)    All other components within normal limits  TROPONIN I (HIGH SENSITIVITY) - Abnormal; Notable for the following components:   Troponin I (High Sensitivity) 195 (*)    All other components within normal limits  TROPONIN I (HIGH SENSITIVITY) - Abnormal; Notable for the following components:   Troponin I (High Sensitivity) 218 (*)    All other components within normal limits  TROPONIN I (HIGH SENSITIVITY) - Abnormal; Notable for the following components:   Troponin I (High Sensitivity) 189 (*)    All other components within normal limits  RESP PANEL BY RT-PCR (FLU A&B, COVID) ARPGX2  LIPASE, BLOOD  MAGNESIUM  PHOSPHORUS  CK    EKG EKG Interpretation  Date/Time:  Monday July 22 2021 12:42:33 EDT Ventricular Rate:  85 PR Interval:  204 QRS Duration: 128 QT Interval:  394 QTC Calculation: 468 R Axis:   140 Text Interpretation: Normal sinus rhythm Right  bundle branch block Left posterior fascicular block  Bifascicular block  T wave abnormality, consider lateral ischemia Abnormal ECG flipped t waves in inferior and lateral leads less pronounced Otherwise no significant change Confirmed by Deno Etienne [phone]) on 07/22/2021 2:43:14 PM  Radiology DG Chest 2 View  Result Date: 07/22/2021 CLINICAL DATA:  Chest pain. EXAM: CHEST - 2 VIEW COMPARISON:  Prior chest radiographs 06/26/2021 and earlier. FINDINGS: Patient rotation to the right. Unchanged cardiomegaly. Aortic atherosclerosis. Small left pleural effusion with associated left basilar atelectasis. Mild right pleural thickening, unchanged. No evidence of pneumothorax. No acute bony abnormality identified. Surgical clips within the lower neck and upper abdomen. IMPRESSION: Unchanged cardiomegaly. Small left pleural effusion with associated left basilar atelectasis. Pneumonia at the left lung base cannot be excluded, and clinical correlation is recommended. Unchanged mild pleural thickening on the right. Aortic Atherosclerosis (ICD10-I70.0). Electronically Signed   By: Kellie Simmering D.O.   On: 07/22/2021 14:51   CT Angio Chest PE W and/or Wo Contrast  Result Date: 07/22/2021 CLINICAL DATA:  PE  suspected, palpitations EXAM: CT ANGIOGRAPHY CHEST WITH CONTRAST TECHNIQUE: Multidetector CT imaging of the chest was performed using the standard protocol during bolus administration of intravenous contrast. Multiplanar CT image reconstructions and MIPs were obtained to evaluate the vascular anatomy. CONTRAST:  169mL OMNIPAQUE IOHEXOL 350 MG/ML SOLN COMPARISON:  05/20/2021 FINDINGS: Cardiovascular: Satisfactory opacification of the pulmonary arteries to the segmental level. No evidence of pulmonary embolism. Previously seen bilateral segmental to subsegmental embolus is resolved. Cardiomegaly. Extensive 3 vessel coronary artery calcifications and/or stents. No pericardial effusion. Aortic atherosclerosis.  Mediastinum/Nodes: No enlarged mediastinal, hilar, or axillary lymph nodes. Status post right lobe thyroidectomy. Trachea, and esophagus demonstrate no significant findings. Lungs/Pleura: Unchanged moderate left, small right loculated pleural effusions and associated atelectasis or consolidation Upper Abdomen: No acute abnormality. Musculoskeletal: Status post right lumpectomy no chest wall abnormality. No acute or significant osseous findings. Review of the MIP images confirms the above findings. IMPRESSION: 1. Negative examination for pulmonary embolism. Previously seen bilateral segmental to subsegmental embolus is resolved. 2. Unchanged moderate left, small right loculated pleural effusions and associated atelectasis or consolidation. 3. Cardiomegaly and coronary artery disease. Aortic Atherosclerosis (ICD10-I70.0). Electronically Signed   By: Delanna Ahmadi M.D.   On: 07/22/2021 17:29   CT ABDOMEN PELVIS W CONTRAST  Result Date: 07/22/2021 CLINICAL DATA:  Bowel obstruction suspected, feeding tube, vomiting EXAM: CT ABDOMEN AND PELVIS WITH CONTRAST TECHNIQUE: Multidetector CT imaging of the abdomen and pelvis was performed using the standard protocol following bolus administration of intravenous contrast. CONTRAST:  141mL OMNIPAQUE IOHEXOL 350 MG/ML SOLN COMPARISON:  06/05/2021 FINDINGS: Lower chest: Please see separately reported examination of the chest. Hepatobiliary: No focal liver abnormality is seen. Status post cholecystectomy. No biliary dilatation. Pancreas: Unremarkable. No pancreatic ductal dilatation or surrounding inflammatory changes. Spleen: Normal in size without significant abnormality. Adrenals/Urinary Tract: Adrenal glands are unremarkable. Benign bilateral renal cysts. Kidneys are otherwise normal, without renal calculi, solid lesion, or hydronephrosis. Bladder is unremarkable. Stomach/Bowel: Percutaneous gastrostomy tube. Stomach is otherwise within normal limits. Appendix appears  normal. No evidence of bowel wall thickening, distention, or inflammatory changes. Vascular/Lymphatic: Aortic atherosclerosis. No enlarged abdominal or pelvic lymph nodes. Reproductive: No mass or other significant abnormality. Other: Anasarca.  No abdominopelvic ascites. Musculoskeletal: No acute or significant osseous findings. IMPRESSION: 1. No acute CT findings of the abdomen or pelvis. No evidence of bowel obstruction. 2. Percutaneous gastrostomy tube. Aortic Atherosclerosis (ICD10-I70.0). Electronically Signed   By: Delanna Ahmadi M.D.   On: 07/22/2021 17:34   CT US GUIDE VASC ACCESS RT NO REPORT  Result Date: 07/22/2021 There is no Radiologist interpretation  for this exam.   Procedures Procedures   Medications Ordered in ED Medications  aspirin EC tablet 81 mg (has no administration in time range)  amiodarone (PACERONE) tablet 200 mg (has no administration in time range)  sacubitril-valsartan (ENTRESTO) 24-26 mg per tablet (has no administration in time range)  metoprolol tartrate (LOPRESSOR) tablet 25 mg (has no administration in time range)  nitroGLYCERIN (NITROSTAT) SL tablet 0.4 mg (has no administration in time range)  rosuvastatin (CRESTOR) tablet 20 mg (has no administration in time range)  traZODone (DESYREL) tablet 50 mg (has no administration in time range)  empagliflozin (JARDIANCE) tablet 10 mg (has no administration in time range)  metFORMIN (GLUCOPHAGE) tablet 500 mg (has no administration in time range)  ondansetron (ZOFRAN) tablet 4 mg (has no administration in time range)  apixaban (ELIQUIS) tablet 5 mg (has no administration in time range)  pantoprazole (PROTONIX) EC tablet 80 mg (  has no administration in time range)  insulin aspart (novoLOG) injection 0-6 Units (has no administration in time range)  sodium chloride flush (NS) 0.9 % injection 3 mL (3 mLs Intravenous Given 07/23/21 0149)  furosemide (LASIX) injection 40 mg (has no administration in time range)   magnesium sulfate IVPB 2 g 50 mL (has no administration in time range)  potassium chloride (KLOR-CON) packet 40 mEq (has no administration in time range)  sodium chloride 0.9 % bolus 1,000 mL (0 mLs Intravenous Stopped 07/22/21 1932)  ondansetron (ZOFRAN) injection 4 mg (4 mg Intravenous Given 07/22/21 1645)  iohexol (OMNIPAQUE) 350 MG/ML injection 100 mL (100 mLs Intravenous Contrast Given 07/22/21 1707)    ED Course  I have reviewed the triage vital signs and the nursing notes.  Pertinent labs & imaging results that were available during my care of the patient were reviewed by me and considered in my medical decision making (see chart for details).    MDM Rules/Calculators/A&P                           80 yo F with a chief complaints of nausea vomiting and shortness of breath.  This is been going on over the weekend.  Was just in the hospital and had a complicated stay requiring intubation and ICU admission eventually was discharged to a rehab facility and was discharged from that on Friday.  Since then she has had persistent nausea and vomiting.  She feels like she has been moving her bowels normally.  Initial lab work-up with a elevated troponin though downward trending from her last hospital stay.  We will obtain a CT scan to evaluate for intra-abdominal pathology.  Treat nausea.  Consult to transition of care for home O2.  Initial trop +, more than two weeks post prior test.  ? Downward trending will delta.  CTA chest.    Signed out to Dr. Alvino Chapel please see his note for further details of care in the ED.  CRITICAL CARE Performed by: Cecilio Asper   Total critical care time: 35 minutes  Critical care time was exclusive of separately billable procedures and treating other patients.  Critical care was necessary to treat or prevent imminent or life-threatening deterioration.  Critical care was time spent personally by me on the following activities: development of  treatment plan with patient and/or surrogate as well as nursing, discussions with consultants, evaluation of patient's response to treatment, examination of patient, obtaining history from patient or surrogate, ordering and performing treatments and interventions, ordering and review of laboratory studies, ordering and review of radiographic studies, pulse oximetry and re-evaluation of patient's condition.  The patients results and plan were reviewed and discussed.   Any x-rays performed were independently reviewed by myself.   Differential diagnosis were considered with the presenting HPI.  Medications  aspirin EC tablet 81 mg (has no administration in time range)  amiodarone (PACERONE) tablet 200 mg (has no administration in time range)  sacubitril-valsartan (ENTRESTO) 24-26 mg per tablet (has no administration in time range)  metoprolol tartrate (LOPRESSOR) tablet 25 mg (has no administration in time range)  nitroGLYCERIN (NITROSTAT) SL tablet 0.4 mg (has no administration in time range)  rosuvastatin (CRESTOR) tablet 20 mg (has no administration in time range)  traZODone (DESYREL) tablet 50 mg (has no administration in time range)  empagliflozin (JARDIANCE) tablet 10 mg (has no administration in time range)  metFORMIN (GLUCOPHAGE) tablet 500 mg (has no administration  in time range)  ondansetron (ZOFRAN) tablet 4 mg (has no administration in time range)  apixaban (ELIQUIS) tablet 5 mg (has no administration in time range)  pantoprazole (PROTONIX) EC tablet 80 mg (has no administration in time range)  insulin aspart (novoLOG) injection 0-6 Units (has no administration in time range)  sodium chloride flush (NS) 0.9 % injection 3 mL (3 mLs Intravenous Given 07/23/21 0149)  furosemide (LASIX) injection 40 mg (has no administration in time range)  magnesium sulfate IVPB 2 g 50 mL (has no administration in time range)  potassium chloride (KLOR-CON) packet 40 mEq (has no administration in time  range)  sodium chloride 0.9 % bolus 1,000 mL (0 mLs Intravenous Stopped 07/22/21 1932)  ondansetron (ZOFRAN) injection 4 mg (4 mg Intravenous Given 07/22/21 1645)  iohexol (OMNIPAQUE) 350 MG/ML injection 100 mL (100 mLs Intravenous Contrast Given 07/22/21 1707)    Vitals:   07/23/21 0200 07/23/21 0300 07/23/21 0308 07/23/21 0700  BP: 124/70 128/73  137/88  Pulse: 84 93  91  Resp: (!) 21 (!) 23  19  Temp:   98.4 F (36.9 C)   TempSrc:   Oral   SpO2: 91% 94%  92%    Final diagnoses:  Elevated troponin  Dyspnea, unspecified type     Final Clinical Impression(s) / ED Diagnoses Final diagnoses:  Elevated troponin  Dyspnea, unspecified type    Rx / DC Orders ED Discharge Orders     None        Deno Etienne, DO 07/23/21 0719

## 2021-07-22 NOTE — Telephone Encounter (Signed)
Called patient regarding concerns.  She reports that she has an appointment scheduled with our clinic in a few days but wanted to know how long she was in a need to keep the feeding tube in.  She reports that over the last 3 days she has not been able to keep any solids or liquids down and every time she takes her medications she vomits them up.  She also reports shortness of breath and says that she was using oxygen while at the rehab facility and that the social worker was supposed to set up oxygen at home but it did not happen.  Discussed concerned that the patient was unable to keep any solids or liquids down as well as her shortness of breath and felt she should be evaluated in the emergency department.  I also reached out to the CVTS team regarding how long they were planning on keeping the feeding tube in.  Spoke with Levonne Spiller, a nurse at the clinic, who is going to reach out to Dr. Cyndia Bent and she will let me know his recommendations.  Patient is going to be evaluated in the emergency department and may not need admission but if so we would gladly admit her.  Greatly appreciate Dr. Caffie Pinto in the CVTS team's assistance in caring for this patient.

## 2021-07-22 NOTE — ED Notes (Signed)
Patient transported to CT 

## 2021-07-22 NOTE — ED Notes (Signed)
Pt c/o weakness while ambulating. O2 97%.

## 2021-07-22 NOTE — Telephone Encounter (Signed)
Spoke with CVTS team regarding patient's feeding tube.  Dr. Cyndia Bent did not place the feeding tube so he is leaving it to the medical team to decide but he does recommend she keep it until she is eating normally and able to keep food down.

## 2021-07-22 NOTE — Care Management (Signed)
ED Care Manager received [facility] consult concerning patient being discharged from SNF without home oxygen  being set up. Noted patient  o2 sats on RA are 97-100%. Met with patient at bedside who reports being on continuous oxygen at the [facility] .  Explained to patient that her oxygen saturation levels are within normal limits resting we will check before discharge on exertion., resting she does not qualify for home oxygen.  I contacted [employer] and was updated that patient  was set up for delivery of w/c but patient is in the ED today someone with [employer].  will f/u with her tomorrow.Also according to [facility] patient is [facility] was arranged with [place].  Updated EDP .

## 2021-07-22 NOTE — Telephone Encounter (Signed)
Patient called to schedule a follow up for rehab follow up. PCP was not available so she is scheduled to see first available.   She would still like to speak with Dr. Caron Presume and ask that he calls her to discuss some concern.   The best call back number is [phone]

## 2021-07-22 NOTE — H&P (Addendum)
[facility] Admission History and Physical Service Pager: [phone]  Patient name: Dana Coleman Medical record number: [id number] Date of birth: Dec 03, 1940 Age: 80 y.o. Gender: female  Primary Care Provider: Gifford Shave, MD Consultants: None Code Status: Partial, no intubation Preferred Emergency Contact: Sherri Rad, sister,/22  Chief Complaint: sob, vomiting and generalized weakness   Assessment and Plan: Dana Coleman is a 80 y.o. female presenting with shortness of breath, vomiting and generalized weakness. PMH is significant for HFrEF, CAD, PE, GERD, T2DM, HLD, HTN, depression, empyema following cholecystectomy, esophageal perforation s/p repair, acute renal failure requiring CRRT, A. fib, G-tube, breast cancer history  Nausea and vomiting  generalized weakness Patient was recently hospitalized from 05/04/2021 to 06/17/2021 and treated for an empyema, esophageal perforation, A. fib with RVR, PE, HFrEF, renal failure with metabolic acidosis, NSTEMI, acute blood loss anemia, severe malnutrition with G-tube placed, and sacral deep tissue pressure injury.  Patient discharged to SNF for rehabilitation.  She was discharged from SNF on 10//7/22 and has been staying with her sister. Patient has had symptoms of generalized weakness accompanied with nausea and vomiting for the past 2 days.  Patient also reports feeling short of breath however did not require oxygen in the ED, oxygen saturations are in the mid 90s on room air with normal work of breathing.  She has mild crackles at the bilateral lung bases with good air movement.  In ED, blood pressure slightly elevated at 177/86 and slightly tachycardic at 106 with normal respiratory rate. CTA was negative for PE and showed moderate left, and small right pleural effusions and associated atelectasis or consolidation unchanged from previous hospitalization.  CXR could not exclude pneumonia at the left lung base  however as patient is afebrile with normal WBC, pneumonia is unlikely.  In the ED, troponin was mildly elevated but trending upward from 195 to 218, will continue to trend.  Troponin was as high as 12,201 two months ago. CMP and CBC were largely normal and lipase WNL. Will check CK as pt complains of generalized weakness. Will check electrolytes as pt reports not having adequate PO intake.  Cause of patient's symptoms could be cardiac in nature as patient has significant history of HFrEF and recent NSTEMI however, pt does not appear to be fluid overloaded on exam.  Could also be caused by a severe malnutrition as patient has had decreased PO intake and has not been using her G-tube. Cause of patient's symptoms is likely multifactorial.  -AM CBC -AM CK -AM CMP -AM magnesium -AM phosphorus -Trend troponin -Zofran 4 mg every 6 hours as needed -PT/OT evaluate and treat  Severe malnutrition  PEG Tube Patient had G-tube placed at last hospitalization due to poor PO intake and severe malnutrition.  States G-tube has not been used during her time at [facility] but that she is still not having good PO intake.  She will eat very small meals but states she gets full very quickly after only a few bites.  Will consult nutrition to determine whether G-tube can safely be removed or if pt should have nutritional supplementation via G tube.  Will consult wound care to evaluate ostomy. -speech therapy consult -Consult nutrition -wound care consult -Monitor electrolytes  HFrEF 25-30%  Echo from last admission (Aug 2022) showed EF of 25 to 30% with mild left ventricular hypertrophy and severely decreased function. Pt does not appear to be fluid over loaded on exam, without LE edema but does have crackles at bilateral lung  bases. BNP elevated at 3600. Home medication includes Entresto and Lasix -f/u BNP -Strict I's/O's -Daily weights -Continue Entresto 1 tablet twice daily -will start IV lasix 40   A. fib with  RVR Heart rate currently stable at 92. Home medications include amiodarone, metoprolol and Eliquis -Continue amiodarone 200 mg daily -Continue Eliquis 5 mg twice daily -Continue metoprolol 25 mg twice daily - cardiac monitoring   GERD Home medication includes omeprazole 40 mg daily. Denies symptoms on admission.  -Protonix 80 mg daily  CAD NSTEMI in 04/2021 during last hospital admission with no intervention.  Home medications include aspirin and Nitrostat -ASA 81 mg -Nitrostat 0.4 mg every 5 minutes  For chest pain  HLD Home medication includes Crestor -Crestor 20 mg daily  Type 2 diabetes Blood sugar today is 161. Home medications include Jardiance 10 mg daily, metformin 500 mg twice daily -Metformin 500 mg twice daily -Jardiance 10 mg daily -Very sensitive SSI  Insomnia Home medication includes trazodone -Trazodone 50 mg at bedtime as needed   FEN/GI: Carb modified Prophylaxis: Eliquis  Disposition: med tele  History of Present Illness:  Dana Coleman is a 80 y.o. female presenting with SOB and decreased oral intake.   Patient reports that she started feeling SOB with generalized weakness 2 days ago and started having emesis when attmepting to take her medications last night. She states that she called her doctor and says that he could hear her panting through the phone and he suggested she go to the ED. She is also having new onset low back pain which occurs with movement and has not used anything to relieve it.  Patient reports feeling lightheaded when she gets up to walk. She denies any syncopal episodes. She states she has not taken any medications today. Now is the first time she has been able to eat. Patient reports living with her sister, [name] now.   Patient states that the team has been trying to wean her from the feeding tube. She reports feeling full after small amounts of food.   She denies using tobacco products, alcohol use or recreational drug  use.  Review Of Systems: Per HPI with the following additions:   Review of Systems  Constitutional:  Positive for appetite change. Negative for chills and fever.  HENT:  Negative for sore throat and trouble swallowing.   Respiratory:  Positive for shortness of breath. Negative for cough.   Cardiovascular:  Positive for palpitations. Negative for chest pain.  Gastrointestinal:  Positive for nausea and vomiting. Negative for abdominal pain, blood in stool and diarrhea.  Genitourinary:  Negative for dysuria and hematuria.  Musculoskeletal:  Positive for back pain.  Neurological:  Positive for weakness and light-headedness.    Patient Active Problem List   Diagnosis Date Noted   Abdominal pain    Dyspnea    On total parenteral nutrition (TPN) 05/25/2021   Impaired functional mobility, balance, gait, and endurance 05/25/2021   S/P thoracotomy    NSTEMI (non-ST elevated myocardial infarction) ([place]) 05/23/2021   Heart failure with reduced ejection fraction ([place]) 05/23/2021   Empyema ([place])    Acute pulmonary embolism (HCC)    Acute respiratory failure with hypoxemia (HCC)    Acute pulmonary edema (HCC)    Acute respiratory failure with hypoxia ([place])    Acute renal failure (HCC)    Hydropneumothorax    Protein-calorie malnutrition, severe 05/15/2021   Pressure injury of skin 05/05/2021   Esophageal perforation 05/05/2021   Vaginal itching 03/25/2021  Left hip pain 02/12/2021   GERD (gastroesophageal reflux disease) 12/19/2020   Neck muscle spasm 02/16/2019   Breast lump 12/07/2018   Plantar fasciitis, right 09/30/2018   Plantar wart 11/17/2017   Insulin dependent type 2 diabetes mellitus, uncontrolled ([place]) 08/14/2014   Debility 06/30/2011   History of thyroid nodule 01/16/2011   History of breast cancer (right)  stage 1 ERA positive    History of cervical cancer    Hyperlipidemia    Hypertension    CAD (coronary artery disease), native coronary artery    Osteopenia 12/10/2006     Past Medical History: Past Medical History:  Diagnosis Date   Allergy    occ uses OTC allergy meds    Arthritis    fingers   Breast cancer ([place]) 04/2008   Right breast   Cataracts, bilateral    removed bilat    Cervical cancer ([place])    When the patient was in her 47s   Depression    Diabetes mellitus without complication ([place])    Heart attack ([place]) 2004   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   Hyperlipidemia    Hypertension    Personal history of radiation therapy 2009   Respiratory failure ([place]) 05/04/2021    Past Surgical History: Past Surgical History:  Procedure Laterality Date   BREAST LUMPECTOMY Right 2009   BREAST SURGERY  2009   right lumpectomy   [place] N/A 05/02/2021   Procedure: LAPAROSCOPIC CHOLECYSTECTOMY;  Surgeon: Greer Pickerel, MD;  Location: [facility] ORS;  Service: General;  Laterality: N/A;   COLONOSCOPY     IR GASTROSTOMY TUBE MOD SED  06/03/2021   LEFT HEART CATHETERIZATION WITH CORONARY ANGIOGRAM N/A 04/20/2014   Procedure: LEFT HEART CATHETERIZATION WITH CORONARY ANGIOGRAM;  Surgeon: Jacolyn Reedy, MD;  Location[facility] C[facility] LAB;  Service: Cardiovascular;  Laterality: N/A;   POLYPECTOMY     STENT PLACEMENT VASCULAR ([place] HX)     THORACOTOMY Right 05/05/2021   Procedure: THORACOTOMY MAJOR REPAIR PERFORATED ESOPHAGUS;  Surgeon: Gaye Pollack, MD;  Location: [facility] OR;  Service: Thoracic;  Laterality: Right;   thyroid     for nodule hemithyroidectomy, benign   THYROIDECTOMY Right 03/2003    Social History: Social History   Tobacco Use   Smoking status: Never   Smokeless tobacco: Never   Tobacco comments:    No plans to start  Vaping Use   Vaping Use: Never used  Substance Use Topics   Alcohol use: No   Drug use: No    Please also refer to relevant sections of EMR.  Family History: Family History  Problem Relation Age of Onset   Heart disease Mother        age 63   Heart disease Father    Cancer Father        unknown    Heart disease Brother    Hypertension Brother    Diabetes Daughter    Stroke Daughter    Cirrhosis Brother    Colon cancer Neg Hx    Colon polyps Neg Hx    Esophageal cancer Neg Hx    Stomach cancer Neg Hx    Rectal cancer Neg Hx      Allergies and Medications: No Known Allergies No current facility-administered medications on file prior to encounter.   Current Outpatient Medications on File Prior to Encounter  Medication Sig Dispense Refill   amiodarone (PACERONE) 200 MG tablet Place 200 mg into feeding tube daily.  apixaban (ELIQUIS) 5 MG TABS tablet Place 1 tablet (5 mg total) into feeding tube 2 (two) times daily. 60 tablet 1   aspirin EC 81 MG tablet Take 81 mg by mouth in the morning. Swallow whole.     cholecalciferol (VITAMIN D) 25 MCG (1000 UNIT) tablet Take 1,000 Units by mouth in the morning.     ENTRESTO 24-26 MG Take 1 tablet by mouth 2 (two) times daily.     furosemide (LASIX) 20 MG tablet Take 20 mg by mouth daily.     JARDIANCE 10 MG TABS tablet TAKE 1 TABLET BY MOUTH EVERY DAY (Patient taking differently: Take 10 mg by mouth daily.) 90 tablet 3   Melatonin 3 MG TBDP Take 6 mg by mouth at bedtime.     metFORMIN (GLUCOPHAGE) 500 MG tablet Place 500 mg into feeding tube 2 (two) times daily.     metoprolol tartrate (LOPRESSOR) 25 MG tablet Place 1 tablet (25 mg total) into feeding tube 2 (two) times daily. 30 tablet 1   nitroGLYCERIN (NITROSTAT) 0.4 MG SL tablet Place 1 tablet (0.4 mg total) under the tongue every 5 (five) minutes as needed for chest pain. (Patient taking differently: Place 0.4 mg under the tongue every 5 (five) minutes x 3 doses as needed for chest pain.) 25 tablet 1   omeprazole (PRILOSEC) 20 MG capsule Take 40 mg by mouth every morning.     ondansetron (ZOFRAN) 4 MG tablet Take 1 tablet (4 mg total) by mouth daily as needed for nausea or vomiting. 30 tablet 1   oxyCODONE (OXY IR/ROXICODONE) 5 MG immediate release tablet Place 0.5 tablets (2.5 mg  total) into feeding tube every 6 (six) hours as needed for severe pain. 30 tablet 0   potassium chloride (KLOR-CON) 10 MEQ tablet Take 10 mEq by mouth daily.     rosuvastatin (CRESTOR) 20 MG tablet TAKE 1 TABLET BY MOUTH EVERYDAY AT BEDTIME (Patient taking differently: Take 20 mg by mouth daily.) 90 tablet 1   traZODone (DESYREL) 50 MG tablet Take 50 mg by mouth as needed for sleep.     Blood Glucose Monitoring Suppl MISC One touch ultra glucose monitor. Check blood sugar twice a day.     glucose blood (ONE TOUCH ULTRA TEST) test strip CHECK BLOOD SUGAR TWICE DAILY 100 each 1   Insulin Pen Needle (B-D UF III MINI PEN NEEDLES) 31G X 5 MM MISC INJECT INSULIN VIA PEN 6 TIMES DAILY 200 each 3   metFORMIN (GLUCOPHAGE-XR) 500 MG 24 hr tablet Take 1 tablet (500 mg total) by mouth 2 (two) times daily. (Patient not taking: No sig reported) 180 tablet 3   OneTouch Delica Lancets 73X MISC 1 Container by Does not apply route as needed. 100 each PRN   polycarbophil (FIBERCON) 625 MG tablet Take 2 tablets (1,250 mg total) by mouth 3 (three) times daily. (Patient not taking: No sig reported) 30 tablet 1    Objective: BP (!) 151/80   Pulse 89   Temp 97.9 F (36.6 C)   Resp 14   SpO2 95%  Exam: General: Frail 80 year old female, lying in bed, eating, NAD Eyes: white sclera ENTM: MMM Cardiovascular: RRR, normal S1/S2 Respiratory: Mild crackles in bilateral lower lobes, good air movement, normal work of breathing Gastrointestinal: Soft, mild diffuse tenderness palpation, nondistended, G-tube in place with no surrounding erythema or induration. MSK: 3/5 muscle strength in bilateral upper and lower extremities Derm: No rashes, skin is warm and dry Neuro: CN II through XII  intact, sensation intact, finger-nose-finger and heel-to-shin negative Psych: Mood and affect appropriate for situation  Labs and Imaging: CBC BMET  Recent Labs  Lab 07/22/21 1320  WBC 6.2  HGB 12.0  HCT 39.8  PLT 201   Recent  Labs  Lab 07/22/21 1320  NA 141  K 4.2  CL 108  CO2 23  BUN 8  CREATININE 0.83  GLUCOSE 161*  CALCIUM 9.7     EKG: My own interpretation (not copied from electronic read) NSR, Qtc 468, RBBB, bifascicular block, flipped t waves in inferior and lateral leads.    Precious Gilding, DO  PGY-1, [facility] Intern pager: [phone], text pages welcome   FPTS Upper-Level Resident Addendum   I have independently interviewed and examined the patient. I have discussed the above with Dr.Jones and agree with the documented plan. My edits for correction/addition/clarification are included above. Please see any attending notes.   Eulis Foster, MD PGY-3, [facility] 07/23/2021 6:28 AM  FPTS Service pager: [phone] (text pages welcome through [facility])

## 2021-07-22 NOTE — ED Provider Notes (Signed)
  Physical Exam  BP (!) 177/86 (BP Location: Right Arm)   Pulse (!) 106   Temp 97.9 F (36.6 C)   Resp 18   SpO2 97%   Physical Exam  ED Course/Procedures     Procedures  MDM  Received patient in signout.  Reportedly came in for shortness of breath.  Stating she needed oxygen.  Recently discharged from nursing home.  Had complicated cholecystectomy with esophageal perforation.  Also has PEG tube.  States she is really not eating at home.  States she still has a tube but she is not being fed through it.  Patient was not hypoxic here but unable ambulate much.  Reportedly is getting an wheelchair placed for home.  However states she is really not managing at home.  Not eating and not drinking.  Will discuss with family practice center.  Troponin is mildly elevated.  Down from when it peaks but may even be slightly increasing now       Dana Belling, MD 07/22/21 [phone]

## 2021-07-22 NOTE — ED Notes (Signed)
Provided pt with bag lunch and water.

## 2021-07-22 NOTE — ED Provider Notes (Signed)
Emergency Medicine Provider Triage Evaluation Note  Dana Coleman , a 80 y.o. female  was evaluated in triage.  Pt complains of shortness of breath and heart palpitations she states that these began yesterday although she told triage RN that this started 1 month ago.  Also states that she feels nauseous and occasionally vomits when attempting p.o nutrition she has a feeding tube currently.  Denies chest pain Endorses occasional dry cough  Review of Systems  Positive: Shortness of breath, nausea vomiting Negative: Fever  Physical Exam  BP 137/81 (BP Location: Left Arm)   Pulse 84   Temp 97.9 F (36.6 C)   Resp (!) 22   SpO2 99%  Gen:   Awake, no distress   Resp:  Normal effort  MSK:   Moves extremities without difficulty  Other:  Abdomen is soft, nontender no guarding or rebound.  Abdomen is notable for feeding tube in place.  There is no erythema surrounding this.  Medical Decision Making  Medically screening exam initiated at 12:51 PM.  Appropriate orders placed.  Dana Coleman was informed that the remainder of the evaluation will be completed by another provider, this initial triage assessment does not replace that evaluation, and the importance of remaining in the ED until their evaluation is complete.  Patient is 80 year old female history of esophageal perforation  Will obtain chest x-ray, abdominal exam chest labs and COVID/influenza PCR   Tedd Sias, PA 07/22/21 [place], DO 07/23/21 0720

## 2021-07-23 ENCOUNTER — Encounter (HOSPITAL_COMMUNITY): Payer: Self-pay | Admitting: Family Medicine

## 2021-07-23 DIAGNOSIS — R110 Nausea: Secondary | ICD-10-CM

## 2021-07-23 DIAGNOSIS — I5023 Acute on chronic systolic (congestive) heart failure: Secondary | ICD-10-CM

## 2021-07-23 DIAGNOSIS — E440 Moderate protein-calorie malnutrition: Secondary | ICD-10-CM | POA: Diagnosis not present

## 2021-07-23 DIAGNOSIS — R627 Adult failure to thrive: Secondary | ICD-10-CM

## 2021-07-23 DIAGNOSIS — Z931 Gastrostomy status: Secondary | ICD-10-CM

## 2021-07-23 DIAGNOSIS — R0600 Dyspnea, unspecified: Secondary | ICD-10-CM | POA: Diagnosis not present

## 2021-07-23 HISTORY — DX: Gastrostomy status: Z93.1

## 2021-07-23 LAB — COMPREHENSIVE METABOLIC PANEL
ALT: 11 U/L (ref 0–44)
AST: 21 U/L (ref 15–41)
Albumin: 2.6 g/dL — ABNORMAL LOW (ref 3.5–5.0)
Alkaline Phosphatase: 45 U/L (ref 38–126)
Anion gap: 9 (ref 5–15)
BUN: 6 mg/dL — ABNORMAL LOW (ref 8–23)
CO2: 22 mmol/L (ref 22–32)
Calcium: 8.9 mg/dL (ref 8.9–10.3)
Chloride: 109 mmol/L (ref 98–111)
Creatinine, Ser: 0.72 mg/dL (ref 0.44–1.00)
GFR, Estimated: >60 mL/min (ref >60)
Glucose, Bld: 117 mg/dL — ABNORMAL HIGH (ref 70–99)
Potassium: 3.6 mmol/L (ref 3.5–5.1)
Sodium: 140 mmol/L (ref 135–145)
Total Bilirubin: 1.1 mg/dL (ref 0.3–1.2)
Total Protein: 6.1 g/dL — ABNORMAL LOW (ref 6.5–8.1)

## 2021-07-23 LAB — CBC
HCT: 35.2 % — ABNORMAL LOW (ref 36.0–46.0)
Hemoglobin: 10.9 g/dL — ABNORMAL LOW (ref 12.0–15.0)
MCH: 27.1 pg (ref 26.0–34.0)
MCHC: 31 g/dL (ref 30.0–36.0)
MCV: 87.6 fL (ref 80.0–100.0)
Platelets: 160 10*3/uL (ref 150–400)
RBC: 4.02 MIL/uL (ref 3.87–5.11)
RDW: 17.5 % — ABNORMAL HIGH (ref 11.5–15.5)
WBC: 5.5 10*3/uL (ref 4.0–10.5)
nRBC: 0 % (ref 0.0–0.2)

## 2021-07-23 LAB — MAGNESIUM: Magnesium: 1.7 mg/dL (ref 1.7–2.4)

## 2021-07-23 LAB — PREALBUMIN: Prealbumin: 13.5 mg/dL — ABNORMAL LOW (ref 18–38)

## 2021-07-23 LAB — BRAIN NATRIURETIC PEPTIDE: B Natriuretic Peptide: 3647.8 pg/mL — ABNORMAL HIGH (ref 0.0–100.0)

## 2021-07-23 LAB — TROPONIN I (HIGH SENSITIVITY): Troponin I (High Sensitivity): 189 ng/L (ref <18)

## 2021-07-23 LAB — CBG MONITORING, ED
Glucose-Capillary: 99 mg/dL (ref 70–99)
Glucose-Capillary: 120 mg/dL — ABNORMAL HIGH (ref 70–99)
Glucose-Capillary: 156 mg/dL — ABNORMAL HIGH (ref 70–99)

## 2021-07-23 LAB — GLUCOSE, CAPILLARY: Glucose-Capillary: 151 mg/dL — ABNORMAL HIGH (ref 70–99)

## 2021-07-23 LAB — PHOSPHORUS: Phosphorus: 3.2 mg/dL (ref 2.5–4.6)

## 2021-07-23 LAB — CK: Total CK: 39 U/L (ref 38–234)

## 2021-07-23 MED ORDER — ACETAMINOPHEN 325 MG PO TABS
650.0000 mg | ORAL_TABLET | Freq: Four times a day (QID) | ORAL | Status: DC
  Administered 2021-07-23 – 2021-07-25 (×7): 650 mg via ORAL
  Filled 2021-07-23 (×7): qty 2

## 2021-07-23 MED ORDER — ZINC OXIDE 40 % EX OINT: 1.0000 "application " | TOPICAL_OINTMENT | Freq: Every day | CUTANEOUS | Status: DC | PRN

## 2021-07-23 MED ORDER — MAGNESIUM SULFATE 2 GM/50ML IV SOLN
2.0000 g | Freq: Once | INTRAVENOUS | Status: AC
  Administered 2021-07-23: 2 g via INTRAVENOUS
  Filled 2021-07-23: qty 50

## 2021-07-23 MED ORDER — FUROSEMIDE 10 MG/ML IJ SOLN
40.0000 mg | Freq: Once | INTRAMUSCULAR | Status: AC
  Administered 2021-07-23: 40 mg via INTRAVENOUS
  Filled 2021-07-23: qty 4

## 2021-07-23 MED ORDER — POTASSIUM CHLORIDE 20 MEQ PO PACK
40.0000 meq | PACK | Freq: Once | ORAL | Status: AC
  Administered 2021-07-23: 40 meq via ORAL
  Filled 2021-07-23: qty 2

## 2021-07-23 NOTE — Progress Notes (Signed)
Family Medicine Teaching Service Daily Progress Note Intern Pager: [phone]  Patient name: Dana Coleman Medical record number: [id number] Date of birth: January 16, 1941 Age: 80 y.o. Gender: female  Primary Care Provider: Gifford Shave, MD Consultants: None Code Status: Partial, no intubation  Pt Overview and Major Events to Date:  10/10- admitted  Assessment and Plan: [name] is a 80 y.o. female presenting with shortness of breath, vomiting and generalized weakness. PMH is significant for HFrEF, CAD, PE, GERD, T2DM, HLD, HTN, depression, empyema following cholecystectomy, esophageal perforation s/p repair, acute renal failure requiring CRRT, A. fib, G-tube, breast cancer history  Poor PO intake and severe malnutrition 2/2 nausea, vomiting This morning, the patient feels weak but denies any nausea or vomiting. Labs this morning largely unremarkable for any electrolyte abnormalities. Mg and Phos on lower end of normal values, 1.7 and 3.2, respectfully. Hypoalbuminemia to 2.6 with decreased total protein of 6.1 indicative of malnutrition state. CK 39. It is likely this is part of a chronic, ongoing severe malnutrition similar to prior hospitalization from 04/2021-06/2021 and patient not using her G-tube. She tells me that she did well at the SNF with eating and would have intermittent episodes of vomiting. At the SNF, she used the G-tube and ate orally. She says she did not like the food there. When she was discharged home to live with her brother and sister on 10/7, she stopped using the G-tube because she and her family did not know how to use it. This is when her vomiting ensued with taking oral medications and food. Imaging wise on CT abdomen/pelvis, there are no biomechanical or structural defects including SBO, contributing to her state. Infection is not likely given afebrile nature, normal WBC, lack of constitutional symptoms. The main goal at this time is to ensure she is able to  tolerate oral intake and to have a home medical SW follow up with a nurse educator. -Replete Mg with 2g -Replete K+ 56meq -Zofran 4mg  q6 h PRN -Calorie counting -Assistance with eating -RD consult -Home medical SW consult -Home RN for G tube education  Shortness of breath, hx PE CTA yesterday negative for PE, previously seen bilateral segmental to subsegmental embolus is resolved. Patient denies chest pain or pain with breathing. No tachycardia. SpO2 98% on room air. She appears comfortable without significant work of breathing. Troponins elevated, but trending downward and significantly less elevated than prior admission. - Monitor respiratory status - Provide supplemental oxygen as needed, SpO2 >90% goal  Acute on chronic HFrEF, EF 25-30% No fluid overload on exam- no lower extremity edema, no JVD, mild bibasilar crackles. BNP elevated, per usual, at 3600. May need increased Lasix at DC. -Strict I/Os -Daily weights  Afib with RVR Not currently in a fib. Rate controlled in 70s-90s. -Continue home medications -Cardiac monitoring  CAD Chronic, stable -Continue ASA 81 mg daily -Nitrostat 0.4mg  q5 minutes PRN for chest pain  Type 2 DM CBG well-controlled. -Metformin 500mg  twice daily  -Jardiance 10mg  daily -Very sensitive SSI  FEN/GI: Carb modified PPx: Eliquis Dispo:Pending PT recommendations  pending clinical improvement . Barriers include decreased PO intake, malnutrition.   Subjective:  [name] has no pain and only complains of shortness of breath.  She is speaking in full sentences, and is not requiring oxygen.  She denies any current nausea.  She has not had any vomiting.  She denies chest pain.  Objective: Temp:  [97.9 F (36.6 C)-98.4 F (36.9 C)] 98.4 F (36.9 C) (10/11 0308) Pulse  Rate:  [57-112] 93 (10/11 0300) Resp:  [12-28] 23 (10/11 0300) BP: (108-177)/(70-92) 128/73 (10/11 0300) SpO2:  [85 %-100 %] 94 % (10/11 0300) Physical Exam: General: Frail  female laying in bed, nontoxic-appearing Cardiovascular: Regular rate and rhythm, not in atrial fibrillation.  Rate 90s Respiratory: No increased respiratory effort.  Lungs clear in upper fields, with mild bibasilar crackles. Abdomen: Soft, nontender, nondistended.  G-tube in place with surrounding erythema and discharge.  Wound nurse was in the room cleaning the G-tube area. Extremities: Warm, dry, well perfused.  No lower extremity edema.  Laboratory: Recent Labs  Lab 07/22/21 1320 07/23/21 0500  WBC 6.2 5.5  HGB 12.0 10.9*  HCT 39.8 35.2*  PLT 201 160   Recent Labs  Lab 07/22/21 1320 07/23/21 0256  NA 141 140  K 4.2 3.6  CL 108 109  CO2 23 22  BUN 8 6*  CREATININE 0.83 0.72  CALCIUM 9.7 8.9  PROT 7.0 6.1*  BILITOT 0.7 1.1  ALKPHOS 52 45  ALT 11 11  AST 23 21  GLUCOSE 161* 117*     Imaging/Diagnostic Tests: DG Chest 2 View  Result Date: 07/22/2021 CLINICAL DATA:  Chest pain. EXAM: CHEST - 2 VIEW COMPARISON:  Prior chest radiographs 06/26/2021 and earlier. FINDINGS: Patient rotation to the right. Unchanged cardiomegaly. Aortic atherosclerosis. Small left pleural effusion with associated left basilar atelectasis. Mild right pleural thickening, unchanged. No evidence of pneumothorax. No acute bony abnormality identified. Surgical clips within the lower neck and upper abdomen. IMPRESSION: Unchanged cardiomegaly. Small left pleural effusion with associated left basilar atelectasis. Pneumonia at the left lung base cannot be excluded, and clinical correlation is recommended. Unchanged mild pleural thickening on the right. Aortic Atherosclerosis (ICD10-I70.0). Electronically Signed   By: Kellie Simmering D.O.   On: 07/22/2021 14:51   CT Angio Chest PE W and/or Wo Contrast  Result Date: 07/22/2021 CLINICAL DATA:  PE suspected, palpitations EXAM: CT ANGIOGRAPHY CHEST WITH CONTRAST TECHNIQUE: Multidetector CT imaging of the chest was performed using the standard protocol during bolus  administration of intravenous contrast. Multiplanar CT image reconstructions and MIPs were obtained to evaluate the vascular anatomy. CONTRAST:  169mL OMNIPAQUE IOHEXOL 350 MG/ML SOLN COMPARISON:  05/20/2021 FINDINGS: Cardiovascular: Satisfactory opacification of the pulmonary arteries to the segmental level. No evidence of pulmonary embolism. Previously seen bilateral segmental to subsegmental embolus is resolved. Cardiomegaly. Extensive 3 vessel coronary artery calcifications and/or stents. No pericardial effusion. Aortic atherosclerosis. Mediastinum/Nodes: No enlarged mediastinal, hilar, or axillary lymph nodes. Status post right lobe thyroidectomy. Trachea, and esophagus demonstrate no significant findings. Lungs/Pleura: Unchanged moderate left, small right loculated pleural effusions and associated atelectasis or consolidation Upper Abdomen: No acute abnormality. Musculoskeletal: Status post right lumpectomy no chest wall abnormality. No acute or significant osseous findings. Review of the MIP images confirms the above findings. IMPRESSION: 1. Negative examination for pulmonary embolism. Previously seen bilateral segmental to subsegmental embolus is resolved. 2. Unchanged moderate left, small right loculated pleural effusions and associated atelectasis or consolidation. 3. Cardiomegaly and coronary artery disease. Aortic Atherosclerosis (ICD10-I70.0). Electronically Signed   By: Delanna Ahmadi M.D.   On: 07/22/2021 17:29   CT ABDOMEN PELVIS W CONTRAST  Result Date: 07/22/2021 CLINICAL DATA:  Bowel obstruction suspected, feeding tube, vomiting EXAM: CT ABDOMEN AND PELVIS WITH CONTRAST TECHNIQUE: Multidetector CT imaging of the abdomen and pelvis was performed using the standard protocol following bolus administration of intravenous contrast. CONTRAST:  134mL OMNIPAQUE IOHEXOL 350 MG/ML SOLN COMPARISON:  06/05/2021 FINDINGS: Lower chest: Please see  separately reported examination of the chest. Hepatobiliary:  No focal liver abnormality is seen. Status post cholecystectomy. No biliary dilatation. Pancreas: Unremarkable. No pancreatic ductal dilatation or surrounding inflammatory changes. Spleen: Normal in size without significant abnormality. Adrenals/Urinary Tract: Adrenal glands are unremarkable. Benign bilateral renal cysts. Kidneys are otherwise normal, without renal calculi, solid lesion, or hydronephrosis. Bladder is unremarkable. Stomach/Bowel: Percutaneous gastrostomy tube. Stomach is otherwise within normal limits. Appendix appears normal. No evidence of bowel wall thickening, distention, or inflammatory changes. Vascular/Lymphatic: Aortic atherosclerosis. No enlarged abdominal or pelvic lymph nodes. Reproductive: No mass or other significant abnormality. Other: Anasarca.  No abdominopelvic ascites. Musculoskeletal: No acute or significant osseous findings. IMPRESSION: 1. No acute CT findings of the abdomen or pelvis. No evidence of bowel obstruction. 2. Percutaneous gastrostomy tube. Aortic Atherosclerosis (ICD10-I70.0). Electronically Signed   By: Delanna Ahmadi M.D.   On: 07/22/2021 17:34   CT US GUIDE VASC ACCESS RT NO REPORT  Result Date: 07/22/2021 There is no Radiologist interpretation  for this exam.    Orvis Brill, DO 07/23/2021, 7:04 AM PGY-1, [place] Intern pager: [phone], text pages welcome

## 2021-07-23 NOTE — Progress Notes (Deleted)
    SUBJECTIVE:   CHIEF COMPLAINT / HPI:   ***  PERTINENT  PMH / PSH: ***  OBJECTIVE:   There were no vitals taken for this visit. ***  General: NAD, pleasant, able to participate in exam Cardiac: RRR, no murmurs. Respiratory: CTAB, normal effort, No wheezes, rales or rhonchi Abdomen: Bowel sounds present, nontender, nondistended, no hepatosplenomegaly. Extremities: no edema or cyanosis. Skin: warm and dry, no rashes noted Neuro: alert, no obvious focal deficits Psych: Normal affect and mood  ASSESSMENT/PLAN:   No problem-specific Assessment & Plan notes found for this encounter.     Doranne Schmutz, DO [facility]    {    This will disappear when note is signed, click to select method of visit    :1} 

## 2021-07-23 NOTE — ED Notes (Signed)
ED TO INPATIENT HANDOFF REPORT  ED Nurse Name and Phone #: Baxter Flattery, RN [phone]  S Name/Age/Gender Dana Coleman 80 y.o. female Room/Bed: 040C/040C  Code Status   Code Status: Partial Code  Home/SNF/Other Home Patient oriented to: self, place, time, and situation Is this baseline? Yes   Triage Complete: Triage complete  Chief Complaint Failure to thrive in adult [R62.7]  Triage Note Patient complains of SOB and palpitations for the past month. Patient also reports feeding tube and states vomiting the meds back up. Patient alert and oriented, denies pain   Allergies No Known Allergies  Level of Care/Admitting Diagnosis ED Disposition     ED Disposition  Admit   Condition  --   [place]: [place] [[id number]]  Level of Care: Telemetry Medical [[id number]]  May place patient in observation at [facility] or [place] if equivalent level of care is available:: No  Covid Evaluation: Confirmed COVID Negative  Diagnosis: Failure to thrive in adult [[id number]]  Admitting Physician: Zenia Resides [[id number]]  Attending Physician: Zenia Resides [[id number]]          B Medical/Surgery History Past Medical History:  Diagnosis Date   Allergy    occ uses OTC allergy meds    Arthritis    fingers   Breast cancer ([place]) 04/2008   Right breast   Cataracts, bilateral    removed bilat    Cervical cancer ([place])    When the patient was in her 67s   Depression    Diabetes mellitus without complication ([place])    Heart attack ([place]) 2004   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 Truddie Coco   PEG (percutaneous endoscopic gastrostomy) status ([place]) 07/23/2021   Personal history of radiation therapy 2009   Respiratory failure ([place]) 05/04/2021   Past Surgical History:  Procedure Laterality Date   BREAST LUMPECTOMY Right 2009   BREAST SURGERY  2009   right lumpectomy   [place] N/A 05/02/2021   Procedure: LAPAROSCOPIC CHOLECYSTECTOMY;  Surgeon: Greer Pickerel, MD;  Location: [facility] ORS;  Service: General;  Laterality: N/A;   COLONOSCOPY     IR GASTROSTOMY TUBE MOD SED  06/03/2021   LEFT HEART CATHETERIZATION WITH CORONARY ANGIOGRAM N/A 04/20/2014   Procedure: LEFT HEART CATHETERIZATION WITH CORONARY ANGIOGRAM;  Surgeon: Jacolyn Reedy, MD;  Location: [facility] CATH LAB;  Service: Cardiovascular;  Laterality: N/A;   POLYPECTOMY     STENT PLACEMENT VASCULAR ([place] HX)     THORACOTOMY Right 05/05/2021   Procedure: THORACOTOMY MAJOR REPAIR PERFORATED ESOPHAGUS;  Surgeon: Gaye Pollack, MD;  Location: [facility] OR;  Service: Thoracic;  Laterality: Right;   thyroid     for nodule hemithyroidectomy, benign   THYROIDECTOMY Right 03/2003     A IV Location/Drains/Wounds Patient Lines/Drains/Airways Status     Active Line/Drains/Airways     Name Placement date Placement time Site Days   Peripheral IV 07/22/21 20 G  Right Antecubital 07/22/21  1645  Antecubital  1   Gastrostomy/Enterostomy Gastrostomy 22 Fr. LUQ 06/03/21  1345  LUQ  50   Incision (Closed) 05/02/21 Abdomen 05/02/21  0926  -- 82   Incision (Closed) 05/28/21 Back Right;Upper;Lateral 05/28/21  2000  -- 56   Pressure Injury 05/04/21 Sacrum Medial Stage 1 -  Intact skin with non-blanchable redness of a localized area usually over a  bony prominence. pink, non-blanchable 05/04/21  1830  -- 80   Pressure Injury 05/20/21 Buttocks Left Stage 2 -  Partial thickness loss of dermis presenting as a shallow open injury with a red, pink wound bed without slough. wound bed red 05/20/21  0600  -- 64   Pressure Injury 05/29/21 Buttocks Left;Lower Stage 2 -  Partial thickness loss of dermis presenting as a shallow open injury with a red, pink wound bed without slough. 2nd area of broken skin lower on buttock 05/29/21  1021  -- 55   Wound / Incision (Open or Dehisced) 05/21/21 Skin tear Back Lateral;Right small round area  of broken skin near her surgical incision on back 05/21/21  1354  Back  63            Intake/Output Last 24 hours  Intake/Output Summary (Last 24 hours) at 07/23/2021 1535 Last data filed at 07/23/2021 2633 Gross per 24 hour  Intake 1715.87 ml  Output --  Net 1715.87 ml    Labs/Imaging Results for orders placed or performed during the hospital encounter of 07/22/21 (from the past 48 hour(s))  Resp Panel by RT-PCR (Flu A&B, Covid) Nasopharyngeal Swab     Status: None   Collection Time: 07/22/21 12:51 PM   Specimen: Nasopharyngeal Swab; Nasopharyngeal(NP) swabs in vial transport medium  Result Value Ref Range   SARS Coronavirus 2 by RT PCR NEGATIVE NEGATIVE    Comment: (NOTE) SARS-CoV-2 target nucleic acids are NOT DETECTED.  The SARS-CoV-2 RNA is generally detectable in upper respiratory specimens during the acute phase of infection. The lowest concentration of SARS-CoV-2 viral copies this assay can detect is 138 copies/mL. A negative result does not preclude SARS-Cov-2 infection and should not be used as the sole basis for treatment or other patient management decisions. A negative result may occur with  improper specimen collection/handling, submission of specimen other than nasopharyngeal swab, presence of viral mutation(s) within the areas targeted by this assay, and inadequate number of viral copies(<138 copies/mL). A negative result must be combined with clinical observations, patient history, and epidemiological information. The expected result is Negative.  Fact Sheet for Patients:  EntrepreneurPulse.com.au  Fact Sheet for Healthcare Providers:  IncredibleEmployment.be  This test is no t yet approved or cleared by the [place] FDA and  has been authorized for detection and/or diagnosis of SARS-CoV-2 by FDA under an Emergency Use Authorization (EUA). This EUA will remain  in effect (meaning this test can be used) for the  duration of the COVID-19 declaration under Section 564(b)(1) of the Act, 21 U.S.C.section 360bbb-3(b)(1), unless the authorization is terminated  or revoked sooner.       Influenza A by PCR NEGATIVE NEGATIVE   Influenza B by PCR NEGATIVE NEGATIVE    Comment: (NOTE) The Xpert Xpress SARS-CoV-2/FLU/RSV plus assay is intended as an aid in the diagnosis of influenza from Nasopharyngeal swab specimens and should not be used as a sole basis for treatment. Nasal washings and aspirates are unacceptable for Xpert Xpress SARS-CoV-2/FLU/RSV testing.  Fact Sheet for Patients: EntrepreneurPulse.com.au  Fact Sheet for Healthcare Providers: IncredibleEmployment.be  This test is not yet approved or cleared by the [place] FDA and has been authorized for detection and/or diagnosis of SARS-CoV-2 by FDA under an Emergency Use Authorization (EUA). This EUA will remain in effect (meaning this test can be used) for the duration of the COVID-19 declaration under Section 564(b)(1) of the Act, 21 U.S.C. section 360bbb-3(b)(1), unless the authorization is terminated or revoked.  Performed  at [facility], [place] [address], [place], [place] [address]   CBC     Status: Abnormal   Collection Time: 07/22/21  1:20 PM  Result Value Ref Range   WBC 6.2 4.0 - 10.5 K/uL   RBC 4.44 3.87 - 5.11 MIL/uL   Hemoglobin 12.0 12.0 - 15.0 g/dL   HCT 39.8 36.0 - 46.0 %   MCV 89.6 80.0 - 100.0 fL   MCH 27.0 26.0 - 34.0 pg   MCHC 30.2 30.0 - 36.0 g/dL   RDW 17.6 (H) 11.5 - 15.5 %   Platelets 201 150 - 400 K/uL   nRBC 0.0 0.0 - 0.2 %    Comment: Performed at [facility] [address] [place], [place] [address]  Troponin I (High Sensitivity)     Status: Abnormal   Collection Time: 07/22/21  1:20 PM  Result Value Ref Range   Troponin I (High Sensitivity) 195 (HH) <18 ng/L    Comment: CRITICAL RESULT CALLED TO, READ BACK BY AND VERIFIED WITH: K PATE RN BY  SSTEPHENS [phone] (NOTE) Elevated high sensitivity troponin I (hsTnI) values and significant  changes across serial measurements may suggest ACS but many other  chronic and acute conditions are known to elevate hsTnI results.  Refer to the Links section for chest pain algorithms and additional  guidance. Performed at [facility], [place] [address] [place], [place] [address]   Comprehensive metabolic panel     Status: Abnormal   Collection Time: 07/22/21  1:20 PM  Result Value Ref Range   Sodium 141 135 - 145 mmol/L   Potassium 4.2 3.5 - 5.1 mmol/L   Chloride 108 98 - 111 mmol/L   CO2 23 22 - 32 mmol/L   Glucose, Bld 161 (H) 70 - 99 mg/dL    Comment: Glucose reference range applies only to samples taken after fasting for at least 8 hours.   BUN 8 8 - 23 mg/dL   Creatinine, Ser 0.83 0.44 - 1.00 mg/dL   Calcium 9.7 8.9 - 10.3 mg/dL   Total Protein 7.0 6.5 - 8.1 g/dL   Albumin 3.1 (L) 3.5 - 5.0 g/dL   AST 23 15 - 41 U/L   ALT 11 0 - 44 U/L   Alkaline Phosphatase 52 38 - 126 U/L   Total Bilirubin 0.7 0.3 - 1.2 mg/dL   GFR, Estimated >60 >60 mL/min    Comment: (NOTE) Calculated using the CKD-EPI Creatinine Equation (2021)    Anion gap 10 5 - 15    Comment: Performed at [facility] [address] [place], [place] [address]  Lipase, blood     Status: None   Collection Time: 07/22/21  1:20 PM  Result Value Ref Range   Lipase 22 11 - 51 U/L    Comment: Performed at [facility] [address], [place], [place] [address]  Troponin I (High Sensitivity)     Status: Abnormal   Collection Time: 07/22/21  4:05 PM  Result Value Ref Range   Troponin I (High Sensitivity) 218 (HH) <18 ng/L    Comment: CRITICAL VALUE NOTED.  VALUE IS CONSISTENT WITH PREVIOUSLY REPORTED AND CALLED VALUE. (NOTE) Elevated high sensitivity troponin I (hsTnI) values and significant  changes across serial measurements may suggest ACS but many other  chronic and acute conditions are known  to elevate hsTnI results.  Refer to the Links section for chest pain algorithms and additional  guidance. Performed  at [facility], [place] [address], [place], [place] [address]   Magnesium     Status: None   Collection Time: 07/23/21  2:56 AM  Result Value Ref Range   Magnesium 1.7 1.7 - 2.4 mg/dL    Comment: Performed at [facility] [address], [place], [place] [address]  Phosphorus     Status: None   Collection Time: 07/23/21  2:56 AM  Result Value Ref Range   Phosphorus 3.2 2.5 - 4.6 mg/dL    Comment: Performed at [facility] [address] [place], [place] [address]  Comprehensive metabolic panel     Status: Abnormal   Collection Time: 07/23/21  2:56 AM  Result Value Ref Range   Sodium 140 135 - 145 mmol/L   Potassium 3.6 3.5 - 5.1 mmol/L   Chloride 109 98 - 111 mmol/L   CO2 22 22 - 32 mmol/L   Glucose, Bld 117 (H) 70 - 99 mg/dL    Comment: Glucose reference range applies only to samples taken after fasting for at least 8 hours.   BUN 6 (L) 8 - 23 mg/dL   Creatinine, Ser 0.72 0.44 - 1.00 mg/dL   Calcium 8.9 8.9 - 10.3 mg/dL   Total Protein 6.1 (L) 6.5 - 8.1 g/dL   Albumin 2.6 (L) 3.5 - 5.0 g/dL   AST 21 15 - 41 U/L   ALT 11 0 - 44 U/L   Alkaline Phosphatase 45 38 - 126 U/L   Total Bilirubin 1.1 0.3 - 1.2 mg/dL   GFR, Estimated >60 >60 mL/min    Comment: (NOTE) Calculated using the CKD-EPI Creatinine Equation (2021)    Anion gap 9 5 - 15    Comment: Performed at [facility], [place] [address] [place], [place] [address]  CK     Status: None   Collection Time: 07/23/21  2:56 AM  Result Value Ref Range   Total CK 39 38 - 234 U/L    Comment: Performed at [facility], [place] [address] [place], [place] [address]  Troponin I (High Sensitivity)     Status: Abnormal   Collection Time: 07/23/21  2:56 AM  Result Value Ref Range   Troponin I (High Sensitivity) 189 (HH) <18 ng/L    Comment: CRITICAL VALUE NOTED.  VALUE IS CONSISTENT WITH  PREVIOUSLY REPORTED AND CALLED VALUE. (NOTE) Elevated high sensitivity troponin I (hsTnI) values and significant  changes across serial measurements may suggest ACS but many other  chronic and acute conditions are known to elevate hsTnI results.  Refer to the Links section for chest pain algorithms and additional  guidance. Performed at [facility], [place] [address], [place], [place] [address]   CBC     Status: Abnormal   Collection Time: 07/23/21  5:00 AM  Result Value Ref Range   WBC 5.5 4.0 - 10.5 K/uL   RBC 4.02 3.87 - 5.11 MIL/uL   Hemoglobin 10.9 (L) 12.0 - 15.0 g/dL   HCT 35.2 (L) 36.0 - 46.0 %   MCV 87.6 80.0 - 100.0 fL   MCH 27.1 26.0 - 34.0 pg   MCHC 31.0 30.0 - 36.0 g/dL   RDW 17.5 (H) 11.5 - 15.5 %   Platelets 160 150 - 400 K/uL   nRBC 0.0 0.0 - 0.2 %    Comment: Performed at [facility], [place] [address], [place], [place] [address]  Brain natriuretic peptide  Status: Abnormal   Collection Time: 07/23/21  5:00 AM  Result Value Ref Range   B Natriuretic Peptide 3,647.8 (H) 0.0 - 100.0 pg/mL    Comment: Performed at [facility] [address], [place], [place] [address]  CBG monitoring, ED     Status: None   Collection Time: 07/23/21  8:38 AM  Result Value Ref Range   Glucose-Capillary 99 70 - 99 mg/dL    Comment: Glucose reference range applies only to samples taken after fasting for at least 8 hours.  CBG monitoring, ED     Status: Abnormal   Collection Time: 07/23/21 12:13 PM  Result Value Ref Range   Glucose-Capillary 120 (H) 70 - 99 mg/dL    Comment: Glucose reference range applies only to samples taken after fasting for at least 8 hours.   DG Chest 2 View  Result Date: 07/22/2021 CLINICAL DATA:  Chest pain. EXAM: CHEST - 2 VIEW COMPARISON:  Prior chest radiographs 06/26/2021 and earlier. FINDINGS: Patient rotation to the right. Unchanged cardiomegaly. Aortic atherosclerosis. Small left pleural effusion with associated left basilar  atelectasis. Mild right pleural thickening, unchanged. No evidence of pneumothorax. No acute bony abnormality identified. Surgical clips within the lower neck and upper abdomen. IMPRESSION: Unchanged cardiomegaly. Small left pleural effusion with associated left basilar atelectasis. Pneumonia at the left lung base cannot be excluded, and clinical correlation is recommended. Unchanged mild pleural thickening on the right. Aortic Atherosclerosis (ICD10-I70.0). Electronically Signed   By: Kellie Simmering D.O.   On: 07/22/2021 14:51   CT Angio Chest PE W and/or Wo Contrast  Result Date: 07/22/2021 CLINICAL DATA:  PE suspected, palpitations EXAM: CT ANGIOGRAPHY CHEST WITH CONTRAST TECHNIQUE: Multidetector CT imaging of the chest was performed using the standard protocol during bolus administration of intravenous contrast. Multiplanar CT image reconstructions and MIPs were obtained to evaluate the vascular anatomy. CONTRAST:  152mL OMNIPAQUE IOHEXOL 350 MG/ML SOLN COMPARISON:  05/20/2021 FINDINGS: Cardiovascular: Satisfactory opacification of the pulmonary arteries to the segmental level. No evidence of pulmonary embolism. Previously seen bilateral segmental to subsegmental embolus is resolved. Cardiomegaly. Extensive 3 vessel coronary artery calcifications and/or stents. No pericardial effusion. Aortic atherosclerosis. Mediastinum/Nodes: No enlarged mediastinal, hilar, or axillary lymph nodes. Status post right lobe thyroidectomy. Trachea, and esophagus demonstrate no significant findings. Lungs/Pleura: Unchanged moderate left, small right loculated pleural effusions and associated atelectasis or consolidation Upper Abdomen: No acute abnormality. Musculoskeletal: Status post right lumpectomy no chest wall abnormality. No acute or significant osseous findings. Review of the MIP images confirms the above findings. IMPRESSION: 1. Negative examination for pulmonary embolism. Previously seen bilateral segmental to  subsegmental embolus is resolved. 2. Unchanged moderate left, small right loculated pleural effusions and associated atelectasis or consolidation. 3. Cardiomegaly and coronary artery disease. Aortic Atherosclerosis (ICD10-I70.0). Electronically Signed   By: Delanna Ahmadi M.D.   On: 07/22/2021 17:29   CT ABDOMEN PELVIS W CONTRAST  Result Date: 07/22/2021 CLINICAL DATA:  Bowel obstruction suspected, feeding tube, vomiting EXAM: CT ABDOMEN AND PELVIS WITH CONTRAST TECHNIQUE: Multidetector CT imaging of the abdomen and pelvis was performed using the standard protocol following bolus administration of intravenous contrast. CONTRAST:  154mL OMNIPAQUE IOHEXOL 350 MG/ML SOLN COMPARISON:  06/05/2021 FINDINGS: Lower chest: Please see separately reported examination of the chest. Hepatobiliary: No focal liver abnormality is seen. Status post cholecystectomy. No biliary dilatation. Pancreas: Unremarkable. No pancreatic ductal dilatation or surrounding inflammatory changes. Spleen: Normal in size without significant abnormality. Adrenals/Urinary Tract: Adrenal glands are unremarkable. Benign bilateral renal  cysts. Kidneys are otherwise normal, without renal calculi, solid lesion, or hydronephrosis. Bladder is unremarkable. Stomach/Bowel: Percutaneous gastrostomy tube. Stomach is otherwise within normal limits. Appendix appears normal. No evidence of bowel wall thickening, distention, or inflammatory changes. Vascular/Lymphatic: Aortic atherosclerosis. No enlarged abdominal or pelvic lymph nodes. Reproductive: No mass or other significant abnormality. Other: Anasarca.  No abdominopelvic ascites. Musculoskeletal: No acute or significant osseous findings. IMPRESSION: 1. No acute CT findings of the abdomen or pelvis. No evidence of bowel obstruction. 2. Percutaneous gastrostomy tube. Aortic Atherosclerosis (ICD10-I70.0). Electronically Signed   By: Delanna Ahmadi M.D.   On: 07/22/2021 17:34   CT US GUIDE VASC ACCESS RT NO  REPORT  Result Date: 07/22/2021 There is no Radiologist interpretation  for this exam.   Pending Labs Unresulted Labs (From admission, onward)     Start     Ordered   07/24/21 0500  Hemoglobin A1c  Tomorrow morning,   R        07/23/21 0941   07/24/21 2542  Basic metabolic panel  Tomorrow morning,   R        07/23/21 1402   07/24/21 0500  Magnesium  Tomorrow morning,   R        07/23/21 1402   07/23/21 0936  Prealbumin  Add-on,   AD        07/23/21 0935            Vitals/Pain Today's Vitals   07/23/21 0700 07/23/21 0949 07/23/21 1000 07/23/21 1400  BP: 137/88 121/85 132/81 123/80  Pulse: 91 90 90 75  Resp: 19  20 10   Temp:      TempSrc:      SpO2: 92%  93% 95%  PainSc:        Isolation Precautions No active isolations  Medications Medications  aspirin EC tablet 81 mg (81 mg Oral Given 07/23/21 0949)  amiodarone (PACERONE) tablet 200 mg (200 mg Oral Given 07/23/21 0951)  sacubitril-valsartan (ENTRESTO) 24-26 mg per tablet (1 tablet Oral Given 07/23/21 0950)  metoprolol tartrate (LOPRESSOR) tablet 25 mg (25 mg Oral Given 07/23/21 0949)  nitroGLYCERIN (NITROSTAT) SL tablet 0.4 mg (has no administration in time range)  rosuvastatin (CRESTOR) tablet 20 mg (20 mg Oral Given 07/23/21 0951)  traZODone (DESYREL) tablet 50 mg (has no administration in time range)  empagliflozin (JARDIANCE) tablet 10 mg (10 mg Oral Given 07/23/21 1420)  metFORMIN (GLUCOPHAGE) tablet 500 mg (has no administration in time range)  ondansetron (ZOFRAN) tablet 4 mg (has no administration in time range)  apixaban (ELIQUIS) tablet 5 mg (5 mg Oral Given 07/23/21 0950)  pantoprazole (PROTONIX) EC tablet 80 mg (80 mg Oral Given 07/23/21 0950)  insulin aspart (novoLOG) injection 0-6 Units (0 Units Subcutaneous Not Given 07/23/21 1216)  sodium chloride flush (NS) 0.9 % injection 3 mL (3 mLs Intravenous Not Given 07/23/21 1052)  liver oil-zinc oxide (DESITIN) 40 % ointment 1 application (has no  administration in time range)  acetaminophen (TYLENOL) tablet 650 mg (0 mg Oral Hold 07/23/21 1422)  sodium chloride 0.9 % bolus 1,000 mL (0 mLs Intravenous Stopped 07/22/21 1932)  ondansetron (ZOFRAN) injection 4 mg (4 mg Intravenous Given 07/22/21 1645)  iohexol (OMNIPAQUE) 350 MG/ML injection 100 mL (100 mLs Intravenous Contrast Given 07/22/21 1707)  furosemide (LASIX) injection 40 mg (40 mg Intravenous Given 07/23/21 0827)  magnesium sulfate IVPB 2 g 50 mL (0 g Intravenous Stopped 07/23/21 0921)  potassium chloride (KLOR-CON) packet 40 mEq (40 mEq Oral Given 07/23/21 0832)  Mobility manual wheelchair Low fall risk   Focused Assessments Cardiac Assessment Handoff:  Cardiac Rhythm: Normal sinus rhythm Lab Results  Component Value Date   CKTOTAL 39 07/23/2021   TROPONINI <0.03 12/13/2017   Lab Results  Component Value Date   DDIMER 19.54 (H) 05/20/2021   Does the Patient currently have chest pain? No    R Recommendations: See Admitting Provider Note  Report given to:   Additional Notes:

## 2021-07-23 NOTE — Evaluation (Signed)
Occupational Therapy Evaluation Patient Details Name: Dana Coleman MRN: [id number] DOB: [birth year] Today's Date: 07/23/2021   History of Present Illness Pt is a 80 yo presenting with shortness of breath, vomiting and generalized weakness.   Rrecently hospitalized from 05/04/2021 to 06/17/2021 and treated for an empyema, esophageal perforation, A. fib with RVR, PE, HFrEF, renal failure with metabolic acidosis, NSTEMI, acute blood loss anemia, severe malnutrition with G-tube placed, and sacral deep tissue pressure injury.  Patient discharged to SNF for rehabilitation.  She was discharged from SNF on 10//7/22 and has been staying with her sister. PMH is significant for HFrEF, CAD, PE, GERD, T2DM, HLD, HTN, depression, empyema following cholecystectomy, esophageal perforation s/p repair, acute renal failure requiring CRRT, A. fib, G-tube, breast cancer history.   Clinical Impression   Pt states that she did not receive any DME after DC from snf/rehab and that her sister has been helping her since DC. Feel pt is appropriate to DC home with [facility] and direct assistance for mobility and ADL tasks. Recommend HH Aide as well. Pt with DOE with minimal activity with SpO2 above 95 on RA. Recommend mobilize with nsg/mobility tech to reduce risk of decompensation. Will follow acutely.      Recommendations for follow up therapy are one component of a multi-disciplinary discharge planning process, led by the attending physician.  Recommendations may be updated based on patient status, additional functional criteria and insurance authorization.   Follow Up Recommendations  Home health OT (Direct S with mobility and ADL tasks; [place])    Equipment Recommendations  3 in 1 bedside commode;Wheelchair (measurements OT);Tub/shower bench (RW)    Recommendations for Other Services       Precautions / Restrictions Precautions Precautions: Fall Restrictions Weight Bearing Restrictions: No      Mobility Bed  Mobility Overal bed mobility: Needs Assistance Bed Mobility: Supine to Sit;Sit to Supine     Supine to sit: Mod assist Sit to supine: Mod assist        Transfers Overall transfer level: Needs assistance Equipment used: Rolling walker (2 wheeled) Transfers: Sit to/from Stand Sit to Stand: Min assist              Balance Overall balance assessment: Needs assistance   Sitting balance-Leahy Scale: Fair       Standing balance-Leahy Scale: Poor                             ADL either performed or assessed with clinical judgement   ADL Overall ADL's : Needs assistance/impaired     Grooming: Set up   Upper Body Bathing: Set up;Sitting   Lower Body Bathing: Moderate assistance;Sit to/from stand   Upper Body Dressing : Set up;Sitting   Lower Body Dressing: Moderate assistance;Sit to/from stand   Toilet Transfer: Minimal assistance;Ambulation;RW   Toileting- Clothing Manipulation and Hygiene: Minimal assistance       Functional mobility during ADLs: Minimal assistance;Rolling walker;Cueing for safety General ADL Comments: Easily fatigues     Vision   Vision Assessment?: No apparent visual deficits     Perception     Praxis      Pertinent Vitals/Pain Pain Assessment: No/denies pain     Hand Dominance Right   Extremity/Trunk Assessment Upper Extremity Assessment Upper Extremity Assessment: Generalized weakness (using functionally)   Lower Extremity Assessment Lower Extremity Assessment: Defer to PT evaluation   Cervical / Trunk Assessment Cervical / Trunk Assessment: Kyphotic   Communication  Communication Communication: No difficulties   Cognition Arousal/Alertness: Awake/alert Behavior During Therapy: WFL for tasks assessed/performed Overall Cognitive Status: No family/caregiver present to determine baseline cognitive functioning (most likely at baseline)                                     General Comments  VSS  however 2/4 DOE with minimal activity    Exercises Exercises: Other exercises Other Exercises Other Exercises: pursed lip breathing Other Exercises: encouraged general BU/LE AROM @ bed level   Shoulder Instructions      Home Living Family/patient expects to be discharged to:: Private residence Living Arrangements: Alone Available Help at Discharge: Family Type of Home: House Home Access: Stairs to enter Technical brewer of Steps: 3 Entrance Stairs-Rails: Left Home Layout: One level     Bathroom Shower/Tub: Teacher, early years/pre: Standard Bathroom Accessibility: No   Home Equipment: Wheelchair - manual          Prior Functioning/Environment Level of Independence: Needs assistance  Gait / Transfers Assistance Needed: has been using WC for mobility at home. Was walking with walker at SNF prior to d/c, but did not send walker home ADL's / Homemaking Assistance Needed: Sister helps with bathind/dressing            OT Problem List: Decreased strength;Decreased activity tolerance;Impaired balance (sitting and/or standing);Decreased safety awareness;Decreased knowledge of use of DME or AE;Cardiopulmonary status limiting activity      OT Treatment/Interventions: Self-care/ADL training;Therapeutic exercise;Energy conservation;DME and/or AE instruction;Therapeutic activities;Patient/family education;Balance training    OT Goals(Current goals can be found in the care plan section) Acute Rehab OT Goals Patient Stated Goal: to go home OT Goal Formulation: With patient Time For Goal Achievement: 08/06/21 Potential to Achieve Goals: Good  OT Frequency: Min 2X/week   Barriers to D/C:            Co-evaluation PT/OT/SLP Co-Evaluation/Treatment: Yes Reason for Co-Treatment: For patient/therapist safety   OT goals addressed during session: ADL's and self-care      AM-PAC OT "6 Clicks" Daily Activity     Outcome Measure Help from another person eating  meals?: None Help from another person taking care of personal grooming?: A Little Help from another person toileting, which includes using toliet, bedpan, or urinal?: A Little Help from another person bathing (including washing, rinsing, drying)?: A Lot Help from another person to put on and taking off regular upper body clothing?: A Little Help from another person to put on and taking off regular lower body clothing?: A Lot 6 Click Score: 17   End of Session Equipment Utilized During Treatment: Gait belt;Rolling walker Nurse Communication: Mobility status  Activity Tolerance: Patient tolerated treatment well Patient left: in bed;with call bell/phone within reach  OT Visit Diagnosis: Unsteadiness on feet (R26.81);Muscle weakness (generalized) (M62.81)                Time: 9735-3299 OT Time Calculation (min): 22 min Charges:  OT General Charges $OT Visit: 1 Visit OT Evaluation $OT Eval Moderate Complexity: [place], OT/L   Acute OT Clinical Specialist Acute Rehabilitation Services Pager [phone] Office [phone]   [facility] 07/23/2021, 12:44 PM

## 2021-07-23 NOTE — Evaluation (Signed)
Physical Therapy Evaluation Patient Details Name: Dana Coleman MRN: [id number] DOB: [birth year] Today's Date: 07/23/2021  History of Present Illness  Pt is a 80 yo presenting with shortness of breath, vomiting and generalized weakness on 10/10.   Rrecently hospitalized from 05/04/2021 to 06/17/2021 and treated for an empyema, esophageal perforation, A. fib with RVR, PE, HFrEF, renal failure with metabolic acidosis, NSTEMI, acute blood loss anemia, severe malnutrition with G-tube placed, and sacral deep tissue pressure injury.  PMH is significant for HFrEF, CAD, PE, GERD, T2DM, HLD, HTN, depression, empyema following cholecystectomy, esophageal perforation s/p repair, acute renal failure requiring CRRT, A. fib, G-tube, breast cancer history.  Clinical Impression  Pt admitted secondary to problem above with deficits below. Pt fatiguing very easily and becoming SOB with minimal activity. Requiring mod A for bed mobility and min to min guard for transfers and gait using RW. Feel pt would benefit from [facility] at d/c to help with ADL tasks and would benefit from [place] services. Will continue to follow acutely.        Recommendations for follow up therapy are one component of a multi-disciplinary discharge planning process, led by the attending physician.  Recommendations may be updated based on patient status, additional functional criteria and insurance authorization.  Follow Up Recommendations Home health PT;Supervision/Assistance - 24 hour [facility])    Equipment Recommendations  Rolling walker with 5" wheels;3in1 (PT);Wheelchair cushion (measurements PT);Wheelchair (measurements PT)    Recommendations for Other Services       Precautions / Restrictions Precautions Precautions: Fall Restrictions Weight Bearing Restrictions: No      Mobility  Bed Mobility Overal bed mobility: Needs Assistance Bed Mobility: Supine to Sit;Sit to Supine     Supine to sit: Mod assist;+2 for  safety/equipment Sit to supine: Mod assist;+2 for safety/equipment   General bed mobility comments: Required assist for trunk and LEs.    Transfers Overall transfer level: Needs assistance Equipment used: Rolling walker (2 wheeled) Transfers: Sit to/from Stand Sit to Stand: Min assist;+2 safety/equipment         General transfer comment: Min A for lift asist and steadying.  Ambulation/Gait Ambulation/Gait assistance: Min assist;Min guard Gait Distance (Feet): 5 Feet Assistive device: Rolling walker (2 wheeled) Gait Pattern/deviations: Step-through pattern;Decreased stride length Gait velocity: Decreased   General Gait Details: Ambulated at EOB X2 in ED room. Pt reporting increased fatigue which limited mobility tolerance. VSS throughout.  Stairs            Wheelchair Mobility    Modified Rankin (Stroke Patients Only)       Balance Overall balance assessment: Needs assistance Sitting-balance support: No upper extremity supported Sitting balance-Leahy Scale: Fair     Standing balance support: Bilateral upper extremity supported Standing balance-Leahy Scale: Poor Standing balance comment: Reliant on BUE support                             Pertinent Vitals/Pain Pain Assessment: No/denies pain    Home Living Family/patient expects to be discharged to:: Private residence Living Arrangements: Alone Available Help at Discharge: Family Type of Home: House Home Access: Stairs to enter Entrance Stairs-Rails: Left Entrance Stairs-Number of Steps: 3 Home Layout: One level Home Equipment: Wheelchair - manual Additional Comments: Was staying with sister prior to admission    Prior Function Level of Independence: Needs assistance   Gait / Transfers Assistance Needed: has been using WC for mobility at home. Was walking with walker at [facility]  prior to d/c, but did not send walker home from SNF.  ADL's / Homemaking Assistance Needed: Sister helps with  bathind/dressing        Hand Dominance   Dominant Hand: Right    Extremity/Trunk Assessment   Upper Extremity Assessment Upper Extremity Assessment: Defer to OT evaluation    Lower Extremity Assessment Lower Extremity Assessment: Generalized weakness    Cervical / Trunk Assessment Cervical / Trunk Assessment: Kyphotic  Communication   Communication: No difficulties  Cognition Arousal/Alertness: Awake/alert Behavior During Therapy: WFL for tasks assessed/performed Overall Cognitive Status: No family/caregiver present to determine baseline cognitive functioning (most likely at baseline)                                        General Comments General comments (skin integrity, edema, etc.): VSS however 2/4 DOE with minimal activity    Exercises Other Exercises Other Exercises: pursed lip breathing Other Exercises: encouraged general BU/LE AROM @ bed level   Assessment/Plan    PT Assessment Patient needs continued PT services  PT Problem List Decreased strength;Decreased balance;Decreased activity tolerance;Decreased mobility;Decreased knowledge of use of DME;Decreased knowledge of precautions;Cardiopulmonary status limiting activity       PT Treatment Interventions DME instruction;Gait training;Functional mobility training;Therapeutic exercise;Therapeutic activities;Balance training;Stair training;Patient/family education    PT Goals (Current goals can be found in the Care Plan section)  Acute Rehab PT Goals Patient Stated Goal: to go home PT Goal Formulation: With patient Time For Goal Achievement: 08/06/21 Potential to Achieve Goals: Good    Frequency Min 3X/week   Barriers to discharge        Co-evaluation PT/OT/SLP Co-Evaluation/Treatment: Yes Reason for Co-Treatment: For patient/therapist safety PT goals addressed during session: Mobility/safety with mobility;Balance OT goals addressed during session: ADL's and self-care        AM-PAC PT "6 Clicks" Mobility  Outcome Measure Help needed turning from your back to your side while in a flat bed without using bedrails?: A Little Help needed moving from lying on your back to sitting on the side of a flat bed without using bedrails?: A Lot Help needed moving to and from a bed to a chair (including a wheelchair)?: A Little Help needed standing up from a chair using your arms (e.g., wheelchair or bedside chair)?: A Little Help needed to walk in hospital room?: A Little Help needed climbing 3-5 steps with a railing? : A Lot 6 Click Score: 16    End of Session Equipment Utilized During Treatment: Gait belt Activity Tolerance: Patient limited by fatigue Patient left: in bed;with call bell/phone within reach (on stretcher in ED) Nurse Communication: Mobility status PT Visit Diagnosis: Unsteadiness on feet (R26.81)    Time: 5364-6803 PT Time Calculation (min) (ACUTE ONLY): 21 min   Charges:   PT Evaluation $PT Eval Moderate Complexity: 1 Mod          Lou Miner, DPT  Acute Rehabilitation Services  Pager: [phone] Office: [phone]   Rudean Hitt 07/23/2021, 3:32 PM

## 2021-07-23 NOTE — Evaluation (Signed)
Clinical/Bedside Swallow Evaluation Patient Details  Name: Dana Coleman MRN: [id number] Date of Birth: 1941/04/16  Today's Date: 07/23/2021 Time: SLP Start Time (ACUTE ONLY): 3382 SLP Stop Time (ACUTE ONLY): 1348 SLP Time Calculation (min) (ACUTE ONLY): 15 min  Past Medical History:  Past Medical History:  Diagnosis Date   Allergy    occ uses OTC allergy meds    Arthritis    fingers   Breast cancer ([place]) 04/2008   Right breast   Cataracts, bilateral    removed bilat    Cervical cancer ([place])    When the patient was in her 60s   Depression    Diabetes mellitus without complication ([place])    Heart attack ([place]) 2004   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 Truddie Coco   PEG (percutaneous endoscopic gastrostomy) status ([place]) 07/23/2021   Personal history of radiation therapy 2009   Respiratory failure ([place]) 05/04/2021   Past Surgical History:  Past Surgical History:  Procedure Laterality Date   BREAST LUMPECTOMY Right 2009   BREAST SURGERY  2009   right lumpectomy   [place] N/A 05/02/2021   Procedure: LAPAROSCOPIC CHOLECYSTECTOMY;  Surgeon: Greer Pickerel, MD;  Location: [facility] ORS;  Service: General;  Laterality: N/A;   COLONOSCOPY     IR GASTROSTOMY TUBE MOD SED  06/03/2021   LEFT HEART CATHETERIZATION WITH CORONARY ANGIOGRAM N/A 04/20/2014   Procedure: LEFT HEART CATHETERIZATION WITH CORONARY ANGIOGRAM;  Surgeon: Jacolyn Reedy, MD;  Location: [facility] CATH LAB;  Service: Cardiovascular;  Laterality: N/A;   POLYPECTOMY     STENT PLACEMENT VASCULAR ([place] HX)     THORACOTOMY Right 05/05/2021   Procedure: THORACOTOMY MAJOR REPAIR PERFORATED ESOPHAGUS;  Surgeon: Gaye Pollack, MD;  Location: [facility] OR;  Service: Thoracic;  Laterality: Right;   thyroid     for nodule hemithyroidectomy, benign   THYROIDECTOMY Right 03/2003   HPI:  Pt is a 80 yo presenting with shortness of breath,  vomiting and generalized weakness.   Rrecently hospitalized from 05/04/2021 to 06/17/2021 and treated for an empyema, esophageal perforation, A. fib with RVR, PE, HFrEF, renal failure with metabolic acidosis, NSTEMI, acute blood loss anemia, severe malnutrition with G-tube placed, and sacral deep tissue pressure injury.  Patient discharged to SNF for rehabilitation.  She was discharged from SNF on 10//7/22 and has been staying with her sister. PMH is significant for HFrEF, CAD, PE, GERD, T2DM, HLD, HTN, depression, empyema following cholecystectomy, esophageal perforation s/p repair, acute renal failure requiring CRRT, A. fib, G-tube, breast cancer history    Assessment / Plan / Recommendation  Clinical Impression  Pt presents with a functional swallow. No overt s/sx of aspiration noted with any PO during clinical interactions. Oral motor exam was unremarkable. 3 oz water challenge was unremarkable.  Pt with recent swallowing workup during prior admission including MBSS (intact pharyngeal function, mild oral deficits at that time). Pt did have PEG placed 05/2021 for nutritional support but states since [place] home, not utilizing much. She does note appetite has remained reduced overall and states some intermittent emesis with PO meds. Recommend continuing regular thin liquid diet, meds as tolerated. No further ST needs identified.  SLP Visit Diagnosis: Dysphagia, unspecified (R13.10)    Aspiration Risk  Mild aspiration risk    Diet Recommendation   Regular thin liquids  Medication Administration: Whole meds with liquid  Other  Recommendations Oral Care Recommendations: Oral care BID    Recommendations for follow up therapy are one component of a multi-disciplinary discharge planning process, led by the attending physician.  Recommendations may be updated based on patient status, additional functional criteria and insurance authorization.  Follow up Recommendations None      Frequency and  Duration     N/a       Prognosis        Swallow Study   General Date of Onset: 07/22/21 HPI: Pt is a 80 yo presenting with shortness of breath, vomiting and generalized weakness.   Rrecently hospitalized from 05/04/2021 to 06/17/2021 and treated for an empyema, esophageal perforation, A. fib with RVR, PE, HFrEF, renal failure with metabolic acidosis, NSTEMI, acute blood loss anemia, severe malnutrition with G-tube placed, and sacral deep tissue pressure injury.  Patient discharged to SNF for rehabilitation.  She was discharged from SNF on 10//7/22 and has been staying with her sister. PMH is significant for HFrEF, CAD, PE, GERD, T2DM, HLD, HTN, depression, empyema following cholecystectomy, esophageal perforation s/p repair, acute renal failure requiring CRRT, A. fib, G-tube, breast cancer history Type of Study: Bedside Swallow Evaluation Previous Swallow Assessment: prior swallow workup 05/2021 Diet Prior to this Study: Regular;Thin liquids Temperature Spikes Noted: No Respiratory Status: Room air History of Recent Intubation: No Behavior/Cognition: Alert;Cooperative;Pleasant mood Oral Cavity Assessment: Within Functional Limits Oral Care Completed by SLP: No Oral Cavity - Dentition: Dentures, top;Dentures, bottom Vision: Functional for self-feeding Self-Feeding Abilities: Able to feed self Patient Positioning: Upright in bed Baseline Vocal Quality: Normal Volitional Swallow: Able to elicit    Oral/Motor/Sensory Function Overall Oral Motor/Sensory Function: Within functional limits   Ice Chips Ice chips: Within functional limits Presentation: Spoon   Thin Liquid Thin Liquid: Within functional limits Presentation: Cup;Straw    Nectar Thick Nectar Thick Liquid: Not tested   Honey Thick Honey Thick Liquid: Not tested   Puree Puree: Within functional limits   Solid     Solid: Within functional limits      Marnae Madani H. MA, CCC-SLP Acute Rehabilitation Services   07/23/2021,1:56  PM

## 2021-07-23 NOTE — Consult Note (Addendum)
[facility] Nurse Consult Note: Patient receiving care in [place] Reason for Consult: Ostomy care. Wound type: Patient does not have an ostomy. I assessed the G-Tube site which is leaking and the skin is irritated. She states the facility only changed the dressing every other day.  Pressure Injury POA: NA Measurement: Wound bed: Denuded with no c/o pain associated with the irritation.  Drainage (amount, consistency, odor) Brown malodorous on the dressing removed.  Dressing procedure/placement/frequency: Clean under the G-tube site with NS. Use a 75M skin barrier wipe after the site has dried. Place a split gauze around the tube and secure with Medipore tape. Change daily.  Patient sacrum was also assessed and looks intact, however last admission she had some MASD, therefore I have ordered some Desitin to be used PRN.  ICD-10 CM Codes for Irritant Dermatitis L24A0 - Due to friction or contact with body fluids; unspecified  Monitor the wound area(s) for worsening of condition such as: Signs/symptoms of infection, increase in size, development of or worsening of odor, development of pain, or increased pain at the affected locations.   Notify the medical team if any of these develop.  Pressure Injury Prevention Bundle May use any that apply to this patient. Support surfaces (air mattress) chair cushion Kellie Simmering # [phone]) Heel offloading boots Kellie Simmering # [phone]) Turning and Positioning  Measures to reduce shear (draw sheet, knees up) Skin protection Products (Foam dressing) Moisture management products (Critic-Aid Barrier Cream (Purple top) Sween moisturizing lotion (Pink top in clean supply) Nutrition Management Protection for Medical Devices Routine Skin Assessment  Thank you for the consult. [place] nurse will not follow at this time.   Please re-consult the [facility] team if needed.  Cathlean Marseilles Tamala Julian, MSN, RN, [place], [place], [facility] Wound Treatment Associate Pager [phone]

## 2021-07-24 DIAGNOSIS — I5020 Unspecified systolic (congestive) heart failure: Secondary | ICD-10-CM

## 2021-07-24 DIAGNOSIS — Z789 Other specified health status: Secondary | ICD-10-CM | POA: Diagnosis not present

## 2021-07-24 DIAGNOSIS — R0600 Dyspnea, unspecified: Secondary | ICD-10-CM | POA: Diagnosis not present

## 2021-07-24 DIAGNOSIS — Z931 Gastrostomy status: Secondary | ICD-10-CM

## 2021-07-24 DIAGNOSIS — Z7409 Other reduced mobility: Secondary | ICD-10-CM

## 2021-07-24 LAB — GLUCOSE, CAPILLARY
Glucose-Capillary: 129 mg/dL — ABNORMAL HIGH (ref 70–99)
Glucose-Capillary: 157 mg/dL — ABNORMAL HIGH (ref 70–99)
Glucose-Capillary: 167 mg/dL — ABNORMAL HIGH (ref 70–99)
Glucose-Capillary: 147 mg/dL — ABNORMAL HIGH (ref 70–99)

## 2021-07-24 LAB — BASIC METABOLIC PANEL
Anion gap: 9 (ref 5–15)
BUN: 7 mg/dL — ABNORMAL LOW (ref 8–23)
CO2: 23 mmol/L (ref 22–32)
Calcium: 9 mg/dL (ref 8.9–10.3)
Chloride: 105 mmol/L (ref 98–111)
Creatinine, Ser: 0.93 mg/dL (ref 0.44–1.00)
GFR, Estimated: >60 mL/min (ref >60)
Glucose, Bld: 155 mg/dL — ABNORMAL HIGH (ref 70–99)
Potassium: 3.5 mmol/L (ref 3.5–5.1)
Sodium: 137 mmol/L (ref 135–145)

## 2021-07-24 LAB — HEMOGLOBIN A1C
Hgb A1c MFr Bld: 6.7 % — ABNORMAL HIGH (ref 4.8–5.6)
Mean Plasma Glucose: 145.59 mg/dL

## 2021-07-24 LAB — MAGNESIUM: Magnesium: 2.1 mg/dL (ref 1.7–2.4)

## 2021-07-24 MED ORDER — SPIRONOLACTONE 12.5 MG HALF TABLET
12.5000 mg | ORAL_TABLET | Freq: Every day | ORAL | Status: DC
  Administered 2021-07-24 – 2021-07-26 (×3): 12.5 mg via ORAL
  Filled 2021-07-24 (×4): qty 1

## 2021-07-24 MED ORDER — POTASSIUM CHLORIDE CRYS ER 20 MEQ PO TBCR
40.0000 meq | EXTENDED_RELEASE_TABLET | Freq: Once | ORAL | Status: AC
  Administered 2021-07-24: 40 meq via ORAL
  Filled 2021-07-24: qty 2

## 2021-07-24 MED ORDER — MELATONIN 3 MG PO TABS
3.0000 mg | ORAL_TABLET | Freq: Every day | ORAL | Status: DC
  Administered 2021-07-24 – 2021-07-25 (×3): 3 mg via ORAL
  Filled 2021-07-24 (×3): qty 1

## 2021-07-24 MED ORDER — FREE WATER
30.0000 mL | Status: DC
  Administered 2021-07-24 – 2021-07-26 (×12): 30 mL

## 2021-07-24 MED ORDER — FUROSEMIDE 10 MG/ML IJ SOLN
40.0000 mg | Freq: Once | INTRAMUSCULAR | Status: AC
  Administered 2021-07-24: 40 mg via INTRAVENOUS
  Filled 2021-07-24: qty 4

## 2021-07-24 MED ORDER — ENSURE ENLIVE PO LIQD
237.0000 mL | Freq: Three times a day (TID) | ORAL | Status: DC
  Administered 2021-07-24 – 2021-07-25 (×2): 237 mL via ORAL

## 2021-07-24 MED ORDER — ADULT MULTIVITAMIN LIQUID CH
15.0000 mL | Freq: Every day | ORAL | Status: DC
  Administered 2021-07-25 – 2021-07-26 (×2): 15 mL
  Filled 2021-07-24 (×2): qty 15

## 2021-07-24 NOTE — Progress Notes (Signed)
Family Medicine Teaching Service Daily Progress Note Intern Pager: [phone]  Patient name: Dana Coleman Medical record number: [id number] Date of birth: 1940-12-06 Age: 80 y.o. Gender: female  Primary Care Provider: Gifford Shave, MD Consultants: None Code Status: Partial, no intubation  Pt Overview and Major Events to Date:  10/10- admitted  Assessment and Plan: Dana Coleman is a 80 y.o. female presenting with shortness of breath, vomiting and generalized weakness. PMH is significant for HFrEF, CAD, PE, GERD, T2DM, HLD, HTN, depression, empyema following cholecystectomy, esophageal perforation s/p repair, acute renal failure requiring CRRT, A. fib, G-tube, breast cancer history.  Poor PO intake and severe malnutrition 2/2 nausea, vomiting Ms. Hoefling feels at her baseline this morning. She has not had any episodes of emesis since admission and has been able to tolerate some food and fluids. SLP evaluated the patient and she has a mild aspiration risk. Calorie counts have not been documented properly, but RN assured me that NT would do so starting today. The main goal at this time is to ensure she is able to tolerate oral intake and to have a home medical SW follow up with a nurse educator.  Regarding disposition, she says her sister gets nauseous at the thought of her G-tube and cannot assist her with it, and her brother whom she also lives with has health issues that prevent him from being able to assist her. -AM labs -Replete K+ with 40 mEq -Calorie counting -Assistance with eating -Home medical SW consult -Home RN for G tube education   Shortness of breath, hx PE Patient continues to have shortness of breath.  She has not required any supplemental oxygen since admission and has maintained oxygen saturations in the upper 90s.  She maintained SPO2 92 to 96% on room air while ambulating with PT today, and was able to tolerate treatment well with improved ambulation distances.   I think that her shortness of breath is likely secondary to a chronic, ongoing issue after her prolonged hospital stay in which she had multiple complications.  There is no concern at this time for any acute pathology causing her shortness of breath. - Monitor respiratory status - Provide supplemental oxygen as needed, SpO2 >90% goal   Acute on chronic HFrEF, EF 25-30% No significant volume overload on exam-but she does have mild bibasilar crackles.  I am curious if some of the fluid in her lungs is contributing to her shortness of breath, so we will provide 1 more dose of Lasix.   -IV Lasix 40 mg x 1 -Start spironolactone 12.5 mg twice daily -Strict I/Os -Daily weights   Afib with RVR Not currently in a fib. Rate controlled in 70s-90s. -Continue home medications -Cardiac monitoring   CAD Chronic, stable -Continue ASA 81 mg daily -Nitrostat 0.4mg  q5 minutes PRN for chest pain   Type 2 DM CBG well-controlled. -Metformin 500mg  twice daily  -Jardiance 10mg  daily -Very sensitive SSI  FEN/GI: Carb modified, thin PPx: Eliquis Dispo:Pending PT recommendations  pending clinical improvement . Barriers include decreased p.o. intake, malnutrition.   Subjective:  Dana Coleman complains of shortness of breath this morning stating "I do not know why I still feel this way."  She otherwise has no complaints including nausea, vomiting.  She denies pain anywhere.  Objective: Temp:  [97.5 F (36.4 C)-98.3 F (36.8 C)] 98.2 F (36.8 C) (10/12 0821) Pulse Rate:  [73-93] 73 (10/12 0821) Resp:  [16-19] 19 (10/12 0821) BP: (109-131)/(70-81) 122/70 (10/12 0821) SpO2:  [93 %-97 %]  97 % (10/12 1500) Weight:  [61.4 kg] 61.4 kg (10/12 0500) Physical Exam: General: Frail female laying in bed, nontoxic-appearing, in no distress Cardiovascular: Regular rate and rhythm not in atrial fibrillation Respiratory: Slightly increased respiratory rate, however no respiratory distress.  Lungs clear in upper fields  with mild bibasilar crackles  Abdomen: Soft, nontender nondistended.  G-tube in place with dry dressing intact. Extremities: Warm, dry, well perfused.  No lower extremity edema  Laboratory: Recent Labs  Lab 07/22/21 1320 07/23/21 0500  WBC 6.2 5.5  HGB 12.0 10.9*  HCT 39.8 35.2*  PLT 201 160   Recent Labs  Lab 07/22/21 1320 07/23/21 0256 07/24/21 0155  NA 141 140 137  K 4.2 3.6 3.5  CL 108 109 105  CO2 23 22 23   BUN 8 6* 7*  CREATININE 0.83 0.72 0.93  CALCIUM 9.7 8.9 9.0  PROT 7.0 6.1*  --   BILITOT 0.7 1.1  --   ALKPHOS 52 45  --   ALT 11 11  --   AST 23 21  --   GLUCOSE 161* 117* 155*    Imaging/Diagnostic Tests: No results found.    Orvis Brill, DO 07/24/2021, 3:33 PM PGY-1, [place] Intern pager: [phone], text pages welcome

## 2021-07-24 NOTE — Progress Notes (Signed)
FPTS Brief Progress Note  S: Patient states she cannot sleep and asked for something to help.  States she is still feeling short of breath like she has been since she was admitted.   O: BP 114/77 (BP Location: Left Arm)   Pulse 73   Temp (!) 97.5 F (36.4 C)   Resp 17   SpO2 94%   PE General: 80 year old female lying comfortably in bed, NAD Cardio: RRR, normal S1/S2 Respiratory: Inspiratory crackles at bilateral lung bases, good air movement, normal work of breathing Extremities: No edema in BLE's  A/P: -Melatonin 3 mg daily at bedtime -Continue to follow plan outlined in day teams progress note - Orders reviewed. Labs for AM ordered, which was adjusted as needed.    Precious Gilding, DO 07/24/2021, 2:06 AM PGY-1, [facility] Night Resident  Please page [phone] with questions.

## 2021-07-24 NOTE — Progress Notes (Addendum)
Family Medicine Teaching Service Daily Progress Note Intern Pager: [phone]  Patient name: Dana Coleman Medical record number: [id number] Date of birth: 1941/10/08 Age: 80 y.o. Gender: female  Primary Care Provider: Gifford Shave, MD Consultants: None Code Status: Partial, no intubation  Pt Overview and Major Events to Date:  10/10- admitted  Assessment and Plan: Donnalee W Limburg is a 80 y.o. female presenting with shortness of breath, vomiting and generalized weakness. PMH is significant for HFrEF, CAD, PE, GERD, T2DM, HLD, HTN, depression, empyema following cholecystectomy, esophageal perforation s/p repair, acute renal failure requiring CRRT, A. fib, G-tube, breast cancer history.  Poor PO intake and severe malnutrition 2/2 nausea, vomiting Patient tolerated meals yesterday and had no emesis. She consumed 75% of breakfast and lunch, although calorie counts were not completed. RD Myriam Jacobson met with patient and discovered that vomiting is mostly after medication, but sometimes with empty stomach.  This morning, RN paged me that she was complaining of nausea and could not tolerate swallowing Zofran tablet or metformin, aspirin, Protonix.  I think it is reasonable to crush medications that are approved to be crushed and administered use through the G-tube in addition to discontinuing non-essential medications such as Metformin (last A1c of 6.7%), scheduled tylenol. Ensure supplement was added yesterday by RD, which patient drank this morning and then had subsequent vomiting.  This was initiated prior hospitalization when she drank the Ensure she would have vomiting episodes.  Prealbumin levels were increasing with most recent 13.5, and 1 month ago 12.9 which indicates that her malnutrition is also improving. - Discontinue Ensure alive 3x daily - Patient to flush G-tube herself with free water 81m q4h daily even when not in use - Zofran sublingual dissolvable tablet [id number] PRN - Calorie  counts per nursing - Daily BMP, Mg to monitor for potential refeeding syndrome abnormalities - Home medical SW consult   Shortness of breath, hx PE Remains on room air with no requirement of oxygen supplementation this hospital stay. Received IV Lasix 40 mg x1 yesterday and she does not have any shortness of breath this morning. Bibasilar crackles are improved. She appears improved from a respiratory standpoint - Monitor respiratory status - Provide supplemental oxygen as needed, SPO2 greater than 90% goal -Consider increasing home Lasix dose from 20 mg daily to 40 mg daily   Acute on chronic HFrEF, EF 25-30% No significant volume overload on exam.  Bibasilar crackles are significantly improved today.  Weight yesterday was 61.4 kg, today she is 61.1 kg. -Continue spironolactone 12.5 mg twice daily -Strict I's/O's -Daily weights  Afib with RVR Not currently in atrial fibrillation.  Rate controlled. -Continue home medications -Cardiac monitoring  GERD Chronic, stable. -Decrease Protonix 80 mg to 4056maily.    Type 2 DM Most recent CBG 135.  Glucose well controlled. -Discontinue Metformin (glucose control does not need to be as tight given her age and last A1c) -Continue Jardiance -Very sensitive sliding scale insulin  FEN/GI: Carb modified, thin liquids PPx: Eliquis Dispo:Pending PT recommendations  pending clinical improvement . Barriers include decreased p.o. intake, nausea and vomiting.   Subjective:  [place] well this morning.  She appears to be breathing easier and does not have any shortness of breath.  She denies any nausea or vomiting, but however once I left the room I received a page that she did vomit after eating her entire tray of breakfast and an Ensure.  No chest pain, shortness of breath, lower extremity swelling.  Objective: Temp:  [  97.5 F (36.4 C)-98.3 F (36.8 C)] 97.7 F (36.5 C) (10/12 1553) Pulse Rate:  [71-93] 71 (10/12 1553) Resp:  [17-20]  20 (10/12 1553) BP: (109-131)/(70-83) 118/83 (10/12 1553) SpO2:  [93 %-99 %] 99 % (10/12 1553) Weight:  [61.4 kg] 61.4 kg (10/12 0500) Physical Exam: General: Frail female laying in bed, in no distress Cardiovascular: Regular rate and rhythm, not in atrial fibrillation.  Rate controlled.  No JVD Respiratory: Normal respiratory rate.  No increased work of breathing.  Lungs clear in all fields with improved bibasilar crackles Abdomen: Soft, nontender, nondistended.  G-tube in place with dry dressing and tract and tube patent. Extremities: Warm, dry, well perfused.  No lower extremity edema.  Laboratory: Recent Labs  Lab 07/22/21 1320 07/23/21 0500  WBC 6.2 5.5  HGB 12.0 10.9*  HCT 39.8 35.2*  PLT 201 160   Recent Labs  Lab 07/22/21 1320 07/23/21 0256 07/24/21 0155  NA 141 140 137  K 4.2 3.6 3.5  CL 108 109 105  CO2 _0 BUN 8 6* 7*  CREATININE 0.83 0.72 0.93  CALCIUM 9.7 8.9 9.0  PROT 7.0 6.1*  --   BILITOT 0.7 1.1  --   ALKPHOS 52 45  --   ALT 11 11  --   AST 23 21  --   GLUCOSE 161* 117* 155*    Imaging/Diagnostic Tests: No results found.   Orvis Brill, DO 07/24/2021, 7:44 PM PGY-1, [facility] Intern pager: [phone], text pages welcome

## 2021-07-24 NOTE — Progress Notes (Signed)
SATURATION QUALIFICATIONS: (This note is used to comply with regulatory documentation for home oxygen)  Patient Saturations on Room Air at Rest = 99%  Patient Saturations on Room Air while Ambulating = 92-96%  Oxygen saturation maintained when mobilizing on room air at this time despite patient reports of fatigue and increased work of breathing.

## 2021-07-24 NOTE — Progress Notes (Signed)
Physical Therapy Treatment Patient Details Name: Dana Coleman MRN: [id number] DOB: 04-11-1941 Today's Date: 07/24/2021   History of Present Illness Pt is a 80 yo presenting with shortness of breath, vomiting and generalized weakness on 10/10.   Rrecently hospitalized from 05/04/2021 to 06/17/2021 and treated for an empyema, esophageal perforation, A. fib with RVR, PE, HFrEF, renal failure with metabolic acidosis, NSTEMI, acute blood loss anemia, severe malnutrition with G-tube placed, and sacral deep tissue pressure injury.  PMH is significant for HFrEF, CAD, PE, GERD, T2DM, HLD, HTN, depression, empyema following cholecystectomy, esophageal perforation s/p repair, acute renal failure requiring CRRT, A. fib, G-tube, breast cancer history.    PT Comments    Pt tolerates treatment well with improved ambulation distances, however does demonstrate increased RR and reports SOB with activity. Pt's oxygen saturation remains stable with mobility when on room air. PT provides education on energy conservation methods to improve tolerance for household mobility and ADLs. Pt will benefit from continued aggressive mobilization to improve activity tolerance and to restore independence.   Recommendations for follow up therapy are one component of a multi-disciplinary discharge planning process, led by the attending physician.  Recommendations may be updated based on patient status, additional functional criteria and insurance authorization.  Follow Up Recommendations  Home health PT;Supervision/Assistance - 24 hour     Equipment Recommendations  Rolling walker with 5" wheels;3in1 (PT) (delivered to room during session)    Recommendations for Other Services       Precautions / Restrictions Precautions Precautions: Fall Restrictions Weight Bearing Restrictions: No     Mobility  Bed Mobility Overal bed mobility: Modified Independent Bed Mobility: Supine to Sit     Supine to sit: Modified  independent (Device/Increase time)     General bed mobility comments: increased time, HOB elevated    Transfers Overall transfer level: Needs assistance Equipment used: Rolling walker (2 wheeled) Transfers: Sit to/from Stand Sit to Stand: Supervision            Ambulation/Gait Ambulation/Gait assistance: Min guard Gait Distance (Feet): 30 Feet (30' x 2) Assistive device: Rolling walker (2 wheeled) Gait Pattern/deviations: Step-through pattern Gait velocity: reduced Gait velocity interpretation: <1.8 ft/sec, indicate of risk for recurrent falls General Gait Details: pt with slowed step-through gait, reduced gait speed, mild increase in lateral sway during turns   Chief Strategy Officer    Modified Rankin (Stroke Patients Only)       Balance Overall balance assessment: Needs assistance Sitting-balance support: No upper extremity supported;Feet supported Sitting balance-Leahy Scale: Good     Standing balance support: Bilateral upper extremity supported Standing balance-Leahy Scale: Poor Standing balance comment: reliant on UE support of RW                            Cognition Arousal/Alertness: Awake/alert Behavior During Therapy: WFL for tasks assessed/performed Overall Cognitive Status: Within Functional Limits for tasks assessed                                        Exercises      General Comments General comments (skin integrity, edema, etc.): VSS on RA, pt RR increases with mobility however pt sats remain 92-96% with activity.      Pertinent Vitals/Pain Pain Assessment: Faces Faces Pain Scale: Hurts little more  Pain Location: buttocks Pain Descriptors / Indicators: Sore Pain Intervention(s): Monitored during session    Home Living                      Prior Function            PT Goals (current goals can now be found in the care plan section) Acute Rehab PT Goals Patient Stated  Goal: to go home Progress towards PT goals: Progressing toward goals    Frequency    Min 3X/week      PT Plan Current plan remains appropriate    Co-evaluation              AM-PAC PT "6 Clicks" Mobility   Outcome Measure  Help needed turning from your back to your side while in a flat bed without using bedrails?: None Help needed moving from lying on your back to sitting on the side of a flat bed without using bedrails?: None Help needed moving to and from a bed to a chair (including a wheelchair)?: A Little Help needed standing up from a chair using your arms (e.g., wheelchair or bedside chair)?: A Little Help needed to walk in hospital room?: A Little Help needed climbing 3-5 steps with a railing? : A Lot 6 Click Score: 19    End of Session   Activity Tolerance: Patient limited by fatigue Patient left: in chair;with call bell/phone within reach Nurse Communication: Mobility status PT Visit Diagnosis: Unsteadiness on feet (R26.81)     Time: 5188-4166 PT Time Calculation (min) (ACUTE ONLY): 28 min  Charges:  $Gait Training: 8-22 mins $Therapeutic Activity: 8-22 mins                     Zenaida Niece, PT, DPT Acute Rehabilitation Pager: [phone]    Zenaida Niece 07/24/2021, 2:24 PM

## 2021-07-24 NOTE — Progress Notes (Signed)
Initial Nutrition Assessment  DOCUMENTATION CODES:   Not applicable  INTERVENTION:   Ensure Enlive po TID, each supplement provides 350 kcal and 20 grams of protein  Liquid MVI daily via tube   Free water flushes 33m q4 hours to maintain tube patency   Pt at moderate refeed risk; recommend monitor potassium, magnesium and phosphorus labs daily until stable  NUTRITION DIAGNOSIS:   Inadequate oral intake related to acute illness as evidenced by per patient/family report.  GOAL:   Patient will meet greater than or equal to 90% of their needs  MONITOR:   PO intake, Supplement acceptance, Labs, Weight trends, Skin, I & O's  REASON FOR ASSESSMENT:   Consult Assessment of nutrition requirement/status, Poor PO, Enteral/tube feeding initiation and management  ASSESSMENT:   80y/o female with h/o breast cancer s/p radiation and lumpectomy, depression, DM, HLD, HTN, GERD and cholelithiasis s/p lap cholecystectomy 74/08complicated by readmission two days post op for perforated esophagus, empyema (s/p R thoracotomy with repair of esophageal perforation and decortication of right lung 7/24), ARF requiring CRRT, new Afib with CHF, NSTEMI, TPN from 7/26-8/24 and evenutally requirung IR 35F G-tube placement 8/22 who is now admitted with nausea, vomtiing and FTT.  Pt s/p SLP eval 10/11; pt approved for a regular/thin liquid diet.   Met with pt in room today. Pt is well known to nutrition department from her previous admission. Pt reports receiving Glucerna 1.5 @50ml /hr + ProSource TF daily via G-tube at time of her discharge to SNF on 9/5. Pt reports that she was able to tolerate oral intake at SNF and that she was discontinued off of her tube feeds and tube was only being used for medications. Pt reports that her oral intake at the facility was fair as she did not like the food there and it was often cold on arrival. Pt reports that she was not drinking any supplements at the facility. Pt  reports discharging home to live with her sister on 10/7. Pt reports that she was not shown how to flush, care for or use her G-tube upon discharge so she has not been using the tube for anything at home and reports that the dressing around it has not been changed since she left SNF. Pt reports eating 2 pieces of toast and 2 pieces of bacon for breakfast this morning and reports eating 100% of a tuna sandwich on a bun for lunch. Pt reports intermittent nausea, vomiting and early satiety at home; pt reports that the majority of times when she vomits that its after medication but reports that she occasionally vomits on an empty stomach as well. Pt describes vomiting that comes from deep in her stomach and does not feel this is regurgitation. Per chart, pt is down 10lbs(7%) since discharge from the hospital ~1 month ago; this is significant.   Spoke with patient regarding the importance of adequate nutrition needed to preserve lean muscle. Pt reports that she is fearful of using the tube at home and that her sister get nauseas when even just looking at the tube. Pt does not feel as though tube feeds will be something that she will be able to do at home. Spoke with pt regarding her nutritional status and weight loss and recommend the use of supplements at home. Pt reports that she believes that she can drink three Ensure per day at home. Recommended three high calorie supplement drinks daily at home. Pt can put the supplements down the G-tube if she feels  as though she can not drink them. Educated pt that tube needed to be flushed at least 1-2 times daily even when not in use and that dressing needed to be changed daily. RD will add supplements and MVI to help pt meet her estimated needs. Spoke with pt regarding the importance of daily weights; if patient's weight continues to decrease, we may need to supplement with bolus feeds.    Spoke with Dr. Owens Shark via phone regarding pt's nutritional status. Recommended for  G-tube to remain in place until patient is able to maintain her weight and to meet her estimated needs via oral intake. Will try addition of supplements at home. Pt's sister buys groceries and will be able to provide supplements; sister also prepares meals at home but mainly sandwiches and quick fixes. There is some concerns for possible gastroparesis. Recommended consideration or reglan and possible outpatient GI work-up/gastric emptying study.   Medications reviewed and include: aspirin, insulin, melatonin, metformin, protonix, aldactone  Labs reviewed: K 3.5 wnl, BUN 7(L), Mg 2.1 wnl BNP- 3647.8(H)- 10/11 Hgb 10.9(L), Hct 35.2(L) Cbgs- 129, 157 x 24 hrs AIC 6.7(H)- 10/12  NUTRITION - FOCUSED PHYSICAL EXAM:  Flowsheet Row Most Recent Value  Orbital Region No depletion  Upper Arm Region Mild depletion  Thoracic and Lumbar Region No depletion  Buccal Region No depletion  Temple Region No depletion  Clavicle Bone Region Moderate depletion  Clavicle and Acromion Bone Region Moderate depletion  Scapular Bone Region No depletion  Dorsal Hand Mild depletion  Patellar Region Moderate depletion  Anterior Thigh Region Moderate depletion  Posterior Calf Region Moderate depletion  Edema (RD Assessment) None  Hair Reviewed  Eyes Reviewed  Mouth Reviewed  Skin Reviewed  Nails Reviewed   Diet Order:   Diet Order             Diet Carb Modified Fluid consistency: Thin; Room service appropriate? Yes  Diet effective now                  EDUCATION NEEDS:   Education needs have been addressed  Skin:  Skin Assessment: Reviewed RN Assessment (area of redness sacrum)  Last BM:  pta  Height:   Ht Readings from Last 1 Encounters:  07/24/21 5' 1"  (1.549 m)    Weight:   Wt Readings from Last 1 Encounters:  07/24/21 61.4 kg    Ideal Body Weight:  47.7 kg  BMI:  Body mass index is 25.58 kg/m.  Estimated Nutritional Needs:   Kcal:  1500-1700kcal/day  Protein:   75-85g/day  Fluid:  1.2-1.4L/day  Koleen Distance MS, RD, LDN Please refer to [facility] for RD and/or RD on-call/weekend/after hours pager

## 2021-07-24 NOTE — TOC Initial Note (Signed)
Transition of Care [facility]) - Initial/Assessment Note    Patient Details  Name: Dana Coleman MRN: [id number] Date of Birth: 1941-02-16  Transition of Care [facility]) CM/SW Contact:    Dana Favre, RN Phone Number: 07/24/2021, 10:28 AM  Clinical Narrative:                 Patient discharged from [facility] last Friday, is now staying with her sister Dana Coleman and brother in Sports coach. Updated face sheet information.  Patient was set up for home health with [facility]. NCM spoke with Dana Coleman with [facility], she is aware orders placed for RN,OT,OT,SP and aide.    Patient needs wheel chair , walker and 3 in1. Spoke with Dana Coleman with [facility], will need new orders for DME and MD signed progress note regarding DME.   Patient concerned , she was told when she was discahrged for SNF that she would have home oxygen and she never received. Currently patient at rest in bed with no oxygen on. NCM will ask for ambulation oxygen saturation note. Explained to patient.    Expected Discharge Plan: [place]     Patient Goals and CMS Choice Patient states their goals for this hospitalization and ongoing recovery are:: to return to her sister's home CMS Medicare.gov Compare Post Acute Care list provided to:: Patient Choice offered to / list presented to : Patient  Expected Discharge Plan and Services Expected Discharge Plan: [place]   Discharge Planning Services: CM Consult Post Acute Care Choice: Home Health, Durable Medical Equipment Living arrangements for the past 2 months: Single Family Home                 DME Arranged: Walker rolling, Wheelchair manual, 3-N-1 DME Agency: [employer] Date DME Agency Contacted: 07/24/21 Time DME Agency Contacted: 2426 Representative spoke with at DME Agency: Dana Coleman HH Arranged: PT, OT, RN, Speech Therapy, Nurse's Aide [place] Agency: [facility] ([facility]) Date [place]: 07/24/21 Time [place]: 1026 Representative spoke with at [place]: [place] Arrangements/Services Living arrangements for the past 2 months: [place] Lives with:: Siblings Patient language and need for interpreter reviewed:: Yes Do you feel safe going back to the place where you live?: Yes      Need for Family Participation in Patient Care: Yes (Comment) Care giver support system in place?: Yes (comment)   Criminal Activity/Legal Involvement Pertinent to Current Situation/Hospitalization: No - Comment as needed  Activities of Daily Living      Permission Sought/Granted   Permission granted to share information with : No              Emotional Assessment Appearance:: Appears stated age Attitude/Demeanor/Rapport: Engaged Affect (typically observed): Accepting Orientation: : Oriented to Self, Oriented to Place, Oriented to  Time, Oriented to Situation Alcohol / Substance Use: Not Applicable Psych Involvement: No (comment)  Admission diagnosis:  Elevated troponin [R77.8] Failure to thrive in adult [R62.7] Difficult intravenous access [Z78.9] Dyspnea, unspecified type [R06.00] Patient Active Problem List   Diagnosis Date Noted   PEG (percutaneous endoscopic gastrostomy) status ([place]) 07/23/2021   Failure to thrive in adult 07/22/2021   Abdominal pain    Dyspnea    On enteral nutrition 05/25/2021   Impaired functional mobility, balance, gait, and endurance 05/25/2021   S/P thoracotomy    NSTEMI (non-ST elevated myocardial infarction) ([place]) 05/23/2021   Heart failure with reduced ejection fraction ([place]) 05/23/2021  Empyema ([place])    Acute pulmonary embolism ([place])    Acute respiratory failure with hypoxemia (HCC)    Acute pulmonary edema (HCC)    Acute respiratory failure with hypoxia (HCC)    Acute renal failure (HCC)    Hydropneumothorax    Protein-calorie malnutrition, severe 05/15/2021   Pressure injury of skin 05/05/2021   Esophageal perforation  05/05/2021   GERD (gastroesophageal reflux disease) 12/19/2020   Breast lump 12/07/2018   Insulin dependent type 2 diabetes mellitus, uncontrolled ([place]) 08/14/2014   Debility 06/30/2011   History of thyroid nodule 01/16/2011   History of breast cancer (right)  stage 1 ERA positive    History of cervical cancer    Hyperlipidemia    Hypertension    CAD (coronary artery disease), native coronary artery    PCP:  Gifford Shave, MD Pharmacy:   [id number] - [place], [place] [address] [place] [address] Phone: [phone] Fax: [phone]     Social Determinants of Health (SDOH) Interventions    Readmission Risk Interventions No flowsheet data found.

## 2021-07-24 NOTE — Progress Notes (Signed)
Bedside commode and walker delivered to pts room

## 2021-07-24 NOTE — Care Management (Signed)
    Durable Medical Equipment  (From admission, onward)           Start     Ordered   07/24/21 1035  For home use only DME 3 n 1  Once        07/24/21 1035   07/24/21 1035  For home use only DME Walker rolling  Once       Question Answer Comment  Walker: With [place] Wheels   Patient needs a walker to treat with the following condition Weakness      07/24/21 1035   07/24/21 1034  For home use only DME standard manual wheelchair with seat cushion  Once       Comments: Patient suffers from  shortness of breath, vomiting and generalized weakness which impairs their ability to perform daily activities like ambulating  in the home.  A cane  will not resolve issue with performing activities of daily living. A wheelchair will allow patient to safely perform daily activities. Patient can safely propel the wheelchair in the home or has a caregiver who can provide assistance. Length of need life time . Accessories: elevating leg rests (ELRs), wheel locks, extensions and anti-tippers.  Seat and back cushions   07/24/21 1035

## 2021-07-24 NOTE — Progress Notes (Signed)
FPTS Brief Progress Note  S:Patient with no new complaints or concerns at this time. Sleeping initially in NAD.    O: BP 117/76 (BP Location: Left Arm)   Pulse 73   Temp 98.3 F (36.8 C) (Oral)   Resp 17   Ht 5\' 1"  (1.549 m)   Wt 61.4 kg   SpO2 94%   BMI 25.58 kg/m   Gen: female appearing stated age, lying in bed in NAD   A/P: Dana Coleman is a 80 y.o. female admitted with emesis and decreased oral intake in setting of SOB that has now resolved and is tolerating more PO and awaiting disposition planning with special regard for nutrition.  -no new additions to therapy at this time -patient has no new concerns  - Orders reviewed. Labs for AM ordered, which was adjusted as needed.    Eulis Foster, MD 07/24/2021, 11:37 PM PGY-3, [facility] Night Resident  Please page [phone] with questions.

## 2021-07-25 ENCOUNTER — Ambulatory Visit: Payer: Medicare Other

## 2021-07-25 DIAGNOSIS — Z809 Family history of malignant neoplasm, unspecified: Secondary | ICD-10-CM | POA: Diagnosis not present

## 2021-07-25 DIAGNOSIS — I5023 Acute on chronic systolic (congestive) heart failure: Secondary | ICD-10-CM | POA: Diagnosis present

## 2021-07-25 DIAGNOSIS — I509 Heart failure, unspecified: Secondary | ICD-10-CM | POA: Diagnosis not present

## 2021-07-25 DIAGNOSIS — I252 Old myocardial infarction: Secondary | ICD-10-CM | POA: Diagnosis not present

## 2021-07-25 DIAGNOSIS — Z7982 Long term (current) use of aspirin: Secondary | ICD-10-CM | POA: Diagnosis not present

## 2021-07-25 DIAGNOSIS — E119 Type 2 diabetes mellitus without complications: Secondary | ICD-10-CM | POA: Diagnosis present

## 2021-07-25 DIAGNOSIS — Z7984 Long term (current) use of oral hypoglycemic drugs: Secondary | ICD-10-CM | POA: Diagnosis not present

## 2021-07-25 DIAGNOSIS — I110 Hypertensive heart disease with heart failure: Secondary | ICD-10-CM | POA: Diagnosis present

## 2021-07-25 DIAGNOSIS — E890 Postprocedural hypothyroidism: Secondary | ICD-10-CM | POA: Diagnosis present

## 2021-07-25 DIAGNOSIS — Z8249 Family history of ischemic heart disease and other diseases of the circulatory system: Secondary | ICD-10-CM | POA: Diagnosis not present

## 2021-07-25 DIAGNOSIS — Z8674 Personal history of sudden cardiac arrest: Secondary | ICD-10-CM | POA: Diagnosis not present

## 2021-07-25 DIAGNOSIS — R778 Other specified abnormalities of plasma proteins: Secondary | ICD-10-CM | POA: Diagnosis present

## 2021-07-25 DIAGNOSIS — I480 Paroxysmal atrial fibrillation: Secondary | ICD-10-CM | POA: Diagnosis present

## 2021-07-25 DIAGNOSIS — K219 Gastro-esophageal reflux disease without esophagitis: Secondary | ICD-10-CM | POA: Diagnosis present

## 2021-07-25 DIAGNOSIS — Z20822 Contact with and (suspected) exposure to covid-19: Secondary | ICD-10-CM | POA: Diagnosis present

## 2021-07-25 DIAGNOSIS — Z794 Long term (current) use of insulin: Secondary | ICD-10-CM | POA: Diagnosis not present

## 2021-07-25 DIAGNOSIS — Z823 Family history of stroke: Secondary | ICD-10-CM | POA: Diagnosis not present

## 2021-07-25 DIAGNOSIS — E785 Hyperlipidemia, unspecified: Secondary | ICD-10-CM | POA: Diagnosis present

## 2021-07-25 DIAGNOSIS — M545 Low back pain, unspecified: Secondary | ICD-10-CM | POA: Diagnosis present

## 2021-07-25 DIAGNOSIS — M8580 Other specified disorders of bone density and structure, unspecified site: Secondary | ICD-10-CM | POA: Diagnosis present

## 2021-07-25 DIAGNOSIS — Z7901 Long term (current) use of anticoagulants: Secondary | ICD-10-CM | POA: Diagnosis not present

## 2021-07-25 DIAGNOSIS — G4700 Insomnia, unspecified: Secondary | ICD-10-CM | POA: Diagnosis present

## 2021-07-25 DIAGNOSIS — F32A Depression, unspecified: Secondary | ICD-10-CM | POA: Diagnosis present

## 2021-07-25 DIAGNOSIS — E43 Unspecified severe protein-calorie malnutrition: Secondary | ICD-10-CM | POA: Diagnosis present

## 2021-07-25 DIAGNOSIS — I2510 Atherosclerotic heart disease of native coronary artery without angina pectoris: Secondary | ICD-10-CM | POA: Diagnosis present

## 2021-07-25 DIAGNOSIS — Z79899 Other long term (current) drug therapy: Secondary | ICD-10-CM | POA: Diagnosis not present

## 2021-07-25 DIAGNOSIS — R0600 Dyspnea, unspecified: Secondary | ICD-10-CM | POA: Diagnosis not present

## 2021-07-25 LAB — BASIC METABOLIC PANEL
Anion gap: 10 (ref 5–15)
BUN: 11 mg/dL (ref 8–23)
CO2: 22 mmol/L (ref 22–32)
Calcium: 9.3 mg/dL (ref 8.9–10.3)
Chloride: 108 mmol/L (ref 98–111)
Creatinine, Ser: 0.98 mg/dL (ref 0.44–1.00)
GFR, Estimated: 59 mL/min — ABNORMAL LOW (ref >60)
Glucose, Bld: 190 mg/dL — ABNORMAL HIGH (ref 70–99)
Potassium: 4.2 mmol/L (ref 3.5–5.1)
Sodium: 140 mmol/L (ref 135–145)

## 2021-07-25 LAB — GLUCOSE, CAPILLARY
Glucose-Capillary: 135 mg/dL — ABNORMAL HIGH (ref 70–99)
Glucose-Capillary: 209 mg/dL — ABNORMAL HIGH (ref 70–99)
Glucose-Capillary: 131 mg/dL — ABNORMAL HIGH (ref 70–99)
Glucose-Capillary: 196 mg/dL — ABNORMAL HIGH (ref 70–99)

## 2021-07-25 LAB — MAGNESIUM: Magnesium: 1.9 mg/dL (ref 1.7–2.4)

## 2021-07-25 MED ORDER — ONDANSETRON 4 MG PO TBDP
4.0000 mg | ORAL_TABLET | Freq: Three times a day (TID) | ORAL | Status: DC | PRN
  Administered 2021-07-25: 4 mg via ORAL
  Filled 2021-07-25: qty 1

## 2021-07-25 MED ORDER — POTASSIUM CHLORIDE CRYS ER 20 MEQ PO TBCR
40.0000 meq | EXTENDED_RELEASE_TABLET | Freq: Once | ORAL | Status: AC
  Administered 2021-07-25: 40 meq via ORAL
  Filled 2021-07-25: qty 4

## 2021-07-25 MED ORDER — ENSURE ENLIVE PO LIQD
237.0000 mL | Freq: Two times a day (BID) | ORAL | Status: DC
  Administered 2021-07-25 – 2021-07-26 (×2): 237 mL via ORAL

## 2021-07-25 MED ORDER — PANTOPRAZOLE SODIUM 40 MG PO TBEC
40.0000 mg | DELAYED_RELEASE_TABLET | Freq: Every day | ORAL | Status: DC
  Administered 2021-07-26: 40 mg via ORAL
  Filled 2021-07-25: qty 1

## 2021-07-25 NOTE — Progress Notes (Signed)
Physical Therapy Treatment Patient Details Name: Dana Coleman MRN: [id number] DOB: [birth year] Today's Date: 07/25/2021   History of Present Illness Pt is a 80 y.o. female admitted 07/22/21 with SOB, vomiting, generalized weakness. Of note, recently hospitalized 05/04/21-06/17/21 with epyema, esophageal perforation, afib, PE, ARF, NSTEMI, deep tissue sacral injury with d/c to SNF. Other PMH includes HF, CAD, HF, DM2, HTN, breast CA.   PT Comments    Pt progressing with mobility. Today's session focused on gait training with RW and ambulation for increasing activity tolerance and strength; pt moving well with intermittent min guard for balance. Pt remains limited by generalized weakness and decreased activity tolerance. Continue to recommend HHPT services to maximize functional mobility and independence upon return home.  SpO2 >/93% on RA   Recommendations for follow up therapy are one component of a multi-disciplinary discharge planning process, led by the attending physician.  Recommendations may be updated based on patient status, additional functional criteria and insurance authorization.  Follow Up Recommendations  Home health PT;Supervision for mobility/OOB     Equipment Recommendations  Rolling walker with 5" wheels;3in1 (PT) (delivered)    Recommendations for Other Services       Precautions / Restrictions Precautions Precautions: Fall Restrictions Weight Bearing Restrictions: No     Mobility  Bed Mobility Overal bed mobility: Modified Independent Bed Mobility: Supine to Sit;Sit to Supine                Transfers Overall transfer level: Needs assistance Equipment used: Rolling walker (2 wheeled) Transfers: Sit to/from Stand Sit to Stand: Supervision         General transfer comment: Multiple sit<>stands from EOB and recliner, supervision for safety  Ambulation/Gait Ambulation/Gait assistance: Min guard Gait Distance (Feet): 160 Feet (2x seated rest  breaks) Assistive device: Rolling walker (2 wheeled) Gait Pattern/deviations: Step-through pattern;Decreased stride length;Trunk flexed Gait velocity: Decreased   General Gait Details: Slow, steady gait with RW and intermittent min guard for balance; 2x seated rest breaks secondary to fatigue and SOB; SpO2 >/93% on RA, HR up to 100s   Stairs             Wheelchair Mobility    Modified Rankin (Stroke Patients Only)       Balance Overall balance assessment: Needs assistance Sitting-balance support: No upper extremity supported;Feet supported Sitting balance-Leahy Scale: Good     Standing balance support: Bilateral upper extremity supported;During functional activity Standing balance-Leahy Scale: Poor Standing balance comment: reliant on UE support of RW                            Cognition Arousal/Alertness: Awake/alert Behavior During Therapy: WFL for tasks assessed/performed;Flat affect Overall Cognitive Status: Within Functional Limits for tasks assessed                                        Exercises      General Comments General comments (skin integrity, edema, etc.): SpO2 93-94% on RA      Pertinent Vitals/Pain Pain Assessment: Faces Faces Pain Scale: Hurts a little bit Pain Location: generalized Pain Descriptors / Indicators: Tiring Pain Intervention(s): Monitored during session    Home Living                      Prior Function  PT Goals (current goals can now be found in the care plan section) Progress towards PT goals: Progressing toward goals    Frequency    Min 3X/week      PT Plan Current plan remains appropriate    Co-evaluation              AM-PAC PT "6 Clicks" Mobility   Outcome Measure  Help needed turning from your back to your side while in a flat bed without using bedrails?: None Help needed moving from lying on your back to sitting on the side of a flat bed without  using bedrails?: None Help needed moving to and from a bed to a chair (including a wheelchair)?: A Little Help needed standing up from a chair using your arms (e.g., wheelchair or bedside chair)?: A Little Help needed to walk in hospital room?: A Little Help needed climbing 3-5 steps with a railing? : A Little 6 Click Score: 20    End of Session Equipment Utilized During Treatment: Gait belt Activity Tolerance: Patient tolerated treatment well Patient left: in bed;with call bell/phone within reach;with bed alarm set Nurse Communication: Mobility status PT Visit Diagnosis: Unsteadiness on feet (R26.81)     Time: 9381-8299 PT Time Calculation (min) (ACUTE ONLY): 30 min  Charges:  $Gait Training: 8-22 mins $Therapeutic Exercise: 8-22 mins                     Mabeline Caras, PT, DPT Acute Rehabilitation Services  Pager [phone] Office [place] 07/25/2021, 5:28 PM

## 2021-07-25 NOTE — Hospital Course (Addendum)
Dana Coleman is a 80-year-old female who was admitted for shortness of breath, generalized weakness and vomiting.PMH is significant for HFrEF, CAD, PE, GERD, T2DM, HLD, HTN, depression, empyema following cholecystectomy, esophageal perforation s/p repair, acute renal failure requiring CRRT, A. fib, G-tube, breast cancer history.  Hospital course as outlined below:  Shortness of breath Patient presented to the ED with shortness of breath and she was slightly tachpneic but not hypoxic maintaining SpO2 97-100% ORA. She did not require supplemental oxygen.  Chest x-ray cannot exclude pneumonia in the left lung base, however she was afebrile without leukocytosis. CTA negative for PE. She received diuresis with IV Lasix 40mg  x2 with improvement of shortness of breath. No hypoxia noted with ambulation. At time of discharge, she was hemodynamically stable without shortness of breath on room air and did not qualify for home oxygen.  Generalized weakness and poor p.o. intake Both are chronic longstanding issues.  Vomiting/nausea worsened when she was discharged from SNF on 10/4 and  stopped using her PEG at home.  Over the past month, she has had 10lb weight loss. While inpatient, she tolerated meals and only had 1 episode of emesis. CT abdomen showed no acute findings in abdomen or pelvis, and no evidence of obstruction. Emetic episodes mostly occur with taking medications, and medications were simplified while inpatient. See changes listed below. She was educated on G-tube use and will have further assistance with home aide.  Type 2 DM Glucose well-controlled while inpatient. Metformin d/c at discharge as she does not need strict glucose control and is well within goal glucose/Hgb A1c range.   Changes to medications this admission: Initiated Spironolactone 12.5mg  BID Discontinued Metformin and Klor-con Changed Amiodarone 200mg  to 100mg  once daily Increased Lasix 20mg  to 40mg  daily   Follow-up items for  PCP Reassess fluid status Obtain BMET given changes in diuretics Ensure follow-up with Cardiology and GI Repeat CT chest for lung nodules 09/2021

## 2021-07-25 NOTE — Discharge Instructions (Addendum)
Dear Dana Coleman,   Thank you for letting us participate in your care! In this section, you will find a brief hospital admission summary of why you were admitted to the hospital, what happened during your admission, your diagnosis/diagnoses, and recommended follow up.  You were admitted because you were experiencing shortness of breath, weakness.  You were diagnosed with dyspnea (difficulty breathing) and malnutrition. You were treated with anti-nausea medication, and a medication that helps remove excess fluid from the body (Lasix) Your symptoms improved and you were discharged from the hospital for meeting this goal.    POST-HOSPITAL & CARE INSTRUCTIONS Please follow up at your appointment at the [facility] at the appointment listed below.  STOP taking Metformin. STOP taking Klor-Con. START taking Spironolactone. Please let PCP/Specialists know of any changes in medications that were made.  Please see medications section of this packet for any medication changes.   DOCTOR'S APPOINTMENTS & FOLLOW UP Future Appointments  Date Time Provider [place]  07/31/2021  1:30 PM ACCESS TO CARE POOL [facility] [place]     Thank you for choosing [facility]! Take care and be well!  [facility]  [place], [place] [address] [phone]

## 2021-07-25 NOTE — Progress Notes (Signed)
OT Cancellation Note  Patient Details Name: SHIRELY TOREN MRN: [id number] DOB: 1941-02-27   Cancelled Treatment:    Reason Eval/Treat Not Completed: Other (comment). Pt currently eating lunch. Plan to reattempt at a later time.   Tyrone Schimke, OT Acute Rehabilitation Services Pager: [phone] Office: [phone]  07/25/2021, 1:14 PM

## 2021-07-26 LAB — BASIC METABOLIC PANEL
Anion gap: 6 (ref 5–15)
BUN: 10 mg/dL (ref 8–23)
CO2: 25 mmol/L (ref 22–32)
Calcium: 9 mg/dL (ref 8.9–10.3)
Chloride: 110 mmol/L (ref 98–111)
Creatinine, Ser: 0.89 mg/dL (ref 0.44–1.00)
GFR, Estimated: >60 mL/min (ref >60)
Glucose, Bld: 117 mg/dL — ABNORMAL HIGH (ref 70–99)
Potassium: 3.9 mmol/L (ref 3.5–5.1)
Sodium: 141 mmol/L (ref 135–145)

## 2021-07-26 LAB — GLUCOSE, CAPILLARY
Glucose-Capillary: 114 mg/dL — ABNORMAL HIGH (ref 70–99)
Glucose-Capillary: 137 mg/dL — ABNORMAL HIGH (ref 70–99)
Glucose-Capillary: 189 mg/dL — ABNORMAL HIGH (ref 70–99)

## 2021-07-26 LAB — MAGNESIUM: Magnesium: 2 mg/dL (ref 1.7–2.4)

## 2021-07-26 MED ORDER — APIXABAN 5 MG PO TABS: 5.0000 mg | ORAL_TABLET | Freq: Two times a day (BID) | ORAL | 0 refills | Status: DC

## 2021-07-26 MED ORDER — METOPROLOL TARTRATE 25 MG PO TABS: 25.0000 mg | ORAL_TABLET | Freq: Two times a day (BID) | ORAL | 1 refills | Status: DC

## 2021-07-26 MED ORDER — AMIODARONE HCL 100 MG PO TABS: 100.0000 mg | ORAL_TABLET | Freq: Every day | ORAL | 0 refills | Status: DC

## 2021-07-26 MED ORDER — SPIRONOLACTONE 25 MG PO TABS: 12.5000 mg | ORAL_TABLET | Freq: Every day | ORAL | 0 refills | Status: DC

## 2021-07-26 MED ORDER — FUROSEMIDE 40 MG PO TABS: 40.0000 mg | ORAL_TABLET | Freq: Every day | ORAL | 0 refills | Status: DC

## 2021-07-26 NOTE — Progress Notes (Signed)
Called family medicine to confirm that pt is OK to go home today. DME is in room, granddaughter is here now and can take her home. Pt OK to go home. Will review AVS and all DC instructions with pt and granddaughter.

## 2021-07-26 NOTE — Progress Notes (Signed)
Calorie Count Note  48 hour calorie count ordered.  Diet: Carb Modified Supplements: Ensure BID  Day 1 (10/12): Breakfast: 329 kcal, 18 grams of protein Lunch: 339 kcal, 16 grams of protein Dinner: 325 kcal, 15 grams of protein  Day 2 (10/13): Breakfast: 112 kcal, 3 grams of protein Lunch: 103 kcal, 3 grams of protein Dinner: 400 kcal, 19 grams of protein  Total intake: 1608 kcal (54% of minimum estimated needs)  74 protein (49% of minimum estimated needs)  Patient to discharge today. See interventions for recommendations.  Nutrition Dx: Inadequate oral intake related to acute illness as evidenced by per patient/family report. - ongoing  Goal: Patient will meet greater than or equal to 90% of their needs. - not meeting PO  Intervention:  Discontinue calorie count. Patient not meeting needs PO. Recommend patient to consider using PEG tube to meet needs. If not, patient should drink Ensure at least twice per day, 3 times per day on days with poor intake (<50% of any meal). Continue MVI with minerals even after discharge.  Derrel Nip, RD, LDN (she/her/hers) Registered Dietitian I After-Hours/Weekend Pager # in [place]

## 2021-07-26 NOTE — Discharge Summary (Addendum)
[facility] Discharge Summary  Patient name: Dana Coleman record number: [id number] Date of birth: 02/23/1941 Age: 80 y.o. Gender: female Date of Admission: 07/22/2021  Date of Discharge: 07/26/2021 Admitting Physician: Zenia Resides, MD  Primary Care Provider: Gifford Shave, MD Consultants: None  Indication for Hospitalization: Shortness of breath, weakness  Discharge Diagnoses/Problem List:  Active Problems:   Protein-calorie malnutrition, severe   Heart failure with reduced ejection fraction (HCC)   On enteral nutrition   Impaired functional mobility, balance, gait, and endurance   Dyspnea   Failure to thrive in adult   PEG (percutaneous endoscopic gastrostomy) status ([place])   Disposition: Home with Home Health  Discharge Condition: Stable  Discharge Exam:  General: Elderly female laying in bed eating breakfast tray, in no distress Cardiovascular: RRR. No JVD Respiratory: Normal respiratory rate. No increased work of breathing. No crackles, wheezing. Lungs with good air movement. Abdomen: Soft, nontender, nondistended. G tube in place with dry dressing and tube is patent. Extremities: Warm, dry, well-perfused. No lower extremity edema.  Brief Hospital Course:  Dana Coleman is a 57-year-old female who was admitted for shortness of breath, generalized weakness and vomiting.PMH is significant for HFrEF, CAD, PE, GERD, T2DM, HLD, HTN, depression, empyema following cholecystectomy, esophageal perforation s/p repair, acute renal failure requiring CRRT, A. fib, G-tube, breast cancer history.  Hospital course as outlined below:  Shortness of breath Patient presented to the ED with shortness of breath and she was slightly tachpneic but not hypoxic maintaining SpO2 97-100% ORA. She did not require supplemental oxygen.  Chest x-ray cannot exclude pneumonia in the left lung base, however she was afebrile without leukocytosis. CTA negative  for PE. She received diuresis with IV Lasix 40mg  x2 with improvement of shortness of breath. No hypoxia noted with ambulation. At time of discharge, she was hemodynamically stable without shortness of breath on room air and did not qualify for home oxygen.  Generalized weakness and poor p.o. intake Both are chronic longstanding issues.  Vomiting/nausea worsened when she was discharged from SNF on 10/4 and  stopped using her PEG at home.  Over the past month, she has had 10lb weight loss. While inpatient, she tolerated meals and only had 1 episode of emesis. CT abdomen showed no acute findings in abdomen or pelvis, and no evidence of obstruction. Emetic episodes mostly occur with taking medications, and medications were simplified while inpatient. See changes listed below. She was educated on G-tube use and will have further assistance with home aide.  Type 2 DM Glucose well-controlled while inpatient. Metformin d/c at discharge as she does not need strict glucose control and is well within goal glucose/Hgb A1c range.   Changes to medications this admission: Initiated Spironolactone 12.5mg  BID Discontinued Metformin and Klor-con Changed Amiodarone 200mg  to 100mg  once daily Increased Lasix 20mg  to 40mg  daily   Follow-up items for PCP Reassess fluid status Obtain BMET given changes in diuretics Ensure follow-up with Cardiology and GI Repeat CT chest for lung nodules 09/2021    Significant Labs and Imaging:  Recent Labs  Lab 07/22/21 1320 07/23/21 0500  WBC 6.2 5.5  HGB 12.0 10.9*  HCT 39.8 35.2*  PLT 201 160   Recent Labs  Lab 07/22/21 1320 07/23/21 0256 07/24/21 0155 07/25/21 0915 07/26/21 0157  NA 141 140 137 140 141  K 4.2 3.6 3.5 4.2 3.9  CL 108 109 105 108 110  CO2 23 22 23 22 25   GLUCOSE 161* 117* 155* 190* 117*  BUN 8 6* 7* 11 10  CREATININE 0.83 0.72 0.93 0.98 0.89  CALCIUM 9.7 8.9 9.0 9.3 9.0  MG  --  1.7 2.1 1.9 2.0  PHOS  --  3.2  --   --   --   ALKPHOS 52 45   --   --   --   AST 23 21  --   --   --   ALT 11 11  --   --   --   ALBUMIN 3.1* 2.6*  --   --   --     Discharge Medications:  Allergies as of 07/26/2021   No Known Allergies      Medication List     STOP taking these medications    cholecalciferol 25 MCG (1000 UNIT) tablet Commonly known as: VITAMIN D   metFORMIN 500 MG 24 hr tablet Commonly known as: GLUCOPHAGE-XR   metFORMIN 500 MG tablet Commonly known as: GLUCOPHAGE   oxyCODONE 5 MG immediate release tablet Commonly known as: Oxy IR/ROXICODONE   polycarbophil 625 MG tablet Commonly known as: FIBERCON   potassium chloride 10 MEQ tablet Commonly known as: KLOR-CON       TAKE these medications    amiodarone 100 MG tablet Commonly known as: PACERONE Take 1 tablet (100 mg total) by mouth daily. What changed:  medication strength how much to take how to take this   apixaban 5 MG Tabs tablet Commonly known as: ELIQUIS Take 1 tablet (5 mg total) by mouth 2 (two) times daily. What changed: how to take this   aspirin EC 81 MG tablet Take 81 mg by mouth in the morning. Swallow whole.   Blood Glucose Monitoring Suppl Misc One touch ultra glucose monitor. Check blood sugar twice a day.   Entresto 24-26 MG Generic drug: sacubitril-valsartan Take 1 tablet by mouth 2 (two) times daily.   furosemide 40 MG tablet Commonly known as: LASIX Take 1 tablet (40 mg total) by mouth daily. What changed:  medication strength how much to take   glucose blood test strip Commonly known as: ONE TOUCH ULTRA TEST CHECK BLOOD SUGAR TWICE DAILY   Insulin Pen Needle 31G X 5 MM Misc Commonly known as: B-D UF III MINI PEN NEEDLES INJECT INSULIN VIA PEN 6 TIMES DAILY   Jardiance 10 MG Tabs tablet Generic drug: empagliflozin TAKE 1 TABLET BY MOUTH EVERY DAY What changed: how much to take   Melatonin 3 MG Tbdp Take 6 mg by mouth at bedtime.   metoprolol tartrate 25 MG tablet Commonly known as: LOPRESSOR Take 1  tablet (25 mg total) by mouth 2 (two) times daily. What changed: how to take this   nitroGLYCERIN 0.4 MG SL tablet Commonly known as: NITROSTAT Place 1 tablet (0.4 mg total) under the tongue every 5 (five) minutes as needed for chest pain. What changed: when to take this   omeprazole 20 MG capsule Commonly known as: PRILOSEC Take 40 mg by mouth every morning.   ondansetron 4 MG tablet Commonly known as: Zofran Take 1 tablet (4 mg total) by mouth daily as needed for nausea or vomiting.   OneTouch Delica Lancets 97C Misc 1 Container by Does not apply route as needed.   rosuvastatin 20 MG tablet Commonly known as: CRESTOR TAKE 1 TABLET BY MOUTH EVERYDAY AT BEDTIME What changed: See the new instructions.   spironolactone 25 MG tablet Commonly known as: ALDACTONE Take 0.5 tablets (12.5 mg total) by mouth daily.   traZODone 50 MG tablet  Commonly known as: DESYREL Take 50 mg by mouth as needed for sleep.        Discharge Instructions: Please refer to Patient Instructions section of EMR for full details.  Patient was counseled important signs and symptoms that should prompt return to medical care, changes in medications, dietary instructions, activity restrictions, and follow up appointments.   Follow-Up Appointments:  Follow-up [place], [facility] Follow up.   Contact information: [place] [address] [place] Follow up on 07/31/2021.   Why: Appointment at 130 pm.  Please arrive 15 mins before secheduled time Contact information: [place]                Orvis Brill, DO 07/28/2021, 7:36 AM PGY-1, [place] Upper-Level Resident Addendum I have independently interviewed and examined the patient. I have discussed the above with Dr. Owens Shark and agree with the documented plan. My  edits for correction/addition/clarification are included above. Please see any attending notes.   Alcus Dad, MD PGY-2, [place] [facility] 07/29/2021 3:07 PM  FPTS Service pager: [phone] (text pages welcome through [place])

## 2021-07-26 NOTE — Progress Notes (Signed)
Family Medicine Teaching Service Daily Progress Note Intern Pager: [phone]  Patient name: Dana Coleman Medical record number: [id number] Date of birth: 10/24/1940 Age: 80 y.o. Gender: female  Primary Care Provider: Gifford Shave, MD Consultants: None Code Status: Partial, no intubation  Pt Overview and Major Events to Date:  10/10- Admitted  LOS: 4 days  Assessment and Plan: Keyia W Spanbauer is a 80 y.o. female presenting with shortness of breath, vomiting and generalized weakness. PMH is significant for HFrEF, CAD, PE, GERD, T2DM, HLD, HTN, depression, empyema following cholecystectomy, esophageal perforation s/p repair, acute renal failure requiring CRRT, A. fib, G-tube, breast cancer history.   Poor PO intake and malnutrition 2/2 nausea, vomiting; IMPROVING Tolerating meals well. No more episodes of emesis since yesterday morning. Denies nausea this morning. She has been flushing G tube herself and getting more comfortable with it. Agrees to going home today with [facility] PT/PT, aide, nurse to assist in further education regarding using G-tube for medications/nutritional supplement if needed. - Patient to flush G-tube herself with free water 46ml q4h daily even when not in use - Zofran sublingual dissolvable tablet 4mg  q8h PRN - Calorie counts per nursing - Home medical SW consult   Shortness of breath, hx PE, IMPROVED Remains ORA. No SOB or dyspnea this morning. Has not require oxygen supplementation this admission. - Monitor respiratory status - Provide supplemental oxygen as needed, SPO2 greater than 90% goal - Increase Lasix dose from 20 mg daily to 40 mg daily at discharge   Acute on chronic HFrEF, EF 25-30% No signs or symptoms of volume overload. Easy work of breathing. No lower extremity edema or pulmonary crackles. -Continue spironolactone 12.5 mg twice daily -Strict I's/O's -Daily weights   Afib with RVR Not currently in atrial fibrillation.  Rate  controlled. -Continue home medications -Cardiac monitoring   GERD Chronic, stable. -Decrease Protonix 80 mg to 40mg  daily.     Type 2 DM Most recent CBG 135.  Glucose well controlled. -Discontinue Metformin (glucose control does not need to be as tight given her age and last A1c) -Continue Jardiance -Very sensitive sliding scale insulin FEN/GI: Carb modified, thin liquids PPx: Eliquis Dispo:Home with home health  today. Barriers include transportation home.   Subjective:  [name] feels well this morning. She has no complaints. She was eating her breakfast while I was in her room without difficulty. Her son will be visiting her here at the hospital later today, and she is amenable to having him drive her home upon discharge today. When discussing prior SNF she was at, she says they did not clean her G-tube site daily. She seems more comfortable with it and is proud of herself for flushing it yesterday with free water.   Objective: Temp:  [97.7 F (36.5 C)-98.6 F (37 C)] 98.6 F (37 C) (10/14 0744) Pulse Rate:  [81-97] 92 (10/14 0744) Resp:  [16-21] 16 (10/14 0744) BP: (103-116)/(69-79) 103/77 (10/14 0744) SpO2:  [92 %-96 %] 92 % (10/14 0744) Weight:  [61.4 kg] 61.4 kg (10/14 0444) Physical Exam: General: Elderly female laying in bed eating breakfast tray, in no distress Cardiovascular: RRR. No JVD Respiratory: Normal respiratory rate. No increased work of breathing. No crackles, wheezing. Lungs with good air movement. Abdomen: Soft, nontender, nondistended. G tube in place with dry dressing and tube is patent. Extremities: Warm, dry, well-perfused. No lower extremity edema.  Laboratory: Recent Labs  Lab 07/22/21 1320 07/23/21 0500  WBC 6.2 5.5  HGB 12.0 10.9*  HCT 39.8 35.2*  PLT 201 160   Recent Labs  Lab 07/22/21 1320 07/23/21 0256 07/24/21 0155 07/25/21 0915 07/26/21 0157  NA 141 140 137 140 141  K 4.2 3.6 3.5 4.2 3.9  CL 108 109 105 108 110  CO2 23 22  23 22 25   BUN 8 6* 7* 11 10  CREATININE 0.83 0.72 0.93 0.98 0.89  CALCIUM 9.7 8.9 9.0 9.3 9.0  PROT 7.0 6.1*  --   --   --   BILITOT 0.7 1.1  --   --   --   ALKPHOS 52 45  --   --   --   ALT 11 11  --   --   --   AST 23 21  --   --   --   GLUCOSE 161* 117* 155* 190* 117*      Imaging/Diagnostic Tests: No results found.   Orvis Brill, DO 07/26/2021, 9:59 AM PGY-1, [place] Intern pager: [phone], text pages welcome

## 2021-07-26 NOTE — Progress Notes (Signed)
FPTS Brief Progress Note  S:Pt resting comfortable    O: BP 106/69 (BP Location: Left Arm)   Pulse 94   Temp 98.4 F (36.9 C) (Oral)   Resp 20   Ht 5\' 1"  (1.549 m)   Wt 61.4 kg   SpO2 96%   BMI 25.58 kg/m    RESP: equal chest rise and fall   A/P: VSS on room air.  - Orders reviewed. Labs for AM ordered, which was adjusted as needed.    Lyndee Hensen, DO 07/26/2021, 5:04 AM PGY-3, [facility] Night Resident  Please page [phone] with questions.

## 2021-07-26 NOTE — Progress Notes (Signed)
Pt ready for DC to home accompanied by family. All med changes and AVS reviewed one by one with family and pt. Wheelchair, 3N1 and walker sent home with pt. Home care agency for follow up with pt, # given and explained and pt is to follow up with Family med on 10/19.

## 2021-07-26 NOTE — Progress Notes (Signed)
Occupational Therapy Treatment Patient Details Name: Dana Coleman MRN: [id number] DOB: [birth year] Today's Date: 07/26/2021   History of present illness Pt is a 80 y.o. female admitted 07/22/21 with SOB, vomiting, generalized weakness. Of note, recently hospitalized 05/04/21-06/17/21 with epyema, esophageal perforation, afib, PE, ARF, NSTEMI, deep tissue sacral injury with d/c to SNF. Other PMH includes HF, CAD, HF, DM2, HTN, breast CA.   OT comments  Pt received in bed, agreeable to OT session. Pt currently requires supervision to minguard assistance for toilet transfers and hygiene. She requires minA for LB dressing. Pt demonstrates limitations with activity tolerance, endurance, stability and generalized weakness impacting independence with self-care. Pt will continue to benefit from skilled OT services to maximize safety and independence with ADL/IADL and functional mobility. Will continue to follow acutely and progress as tolerated.     Recommendations for follow up therapy are one component of a multi-disciplinary discharge planning process, led by the attending physician.  Recommendations may be updated based on patient status, additional functional criteria and insurance authorization.    Follow Up Recommendations  Home health OT (Direct S with mobility and ADL tasks; [place])    Equipment Recommendations  3 in 1 bedside commode;Wheelchair (measurements OT);Tub/shower bench (RW)    Recommendations for Other Services      Precautions / Restrictions Precautions Precautions: Fall Restrictions Weight Bearing Restrictions: No       Mobility Bed Mobility Overal bed mobility: Modified Independent Bed Mobility: Supine to Sit     Supine to sit: Modified independent (Device/Increase time)     General bed mobility comments: increased time and HOB elevated    Transfers Overall transfer level: Needs assistance Equipment used: Rolling walker (2 wheeled) Transfers: Sit to/from  [facility] Sit to Stand: Supervision Stand pivot transfers: Min guard       General transfer comment: minguard for safety    Balance Overall balance assessment: Needs assistance Sitting-balance support: No upper extremity supported;Feet supported Sitting balance-Leahy Scale: Good     Standing balance support: Bilateral upper extremity supported;During functional activity Standing balance-Leahy Scale: Poor Standing balance comment: reliant on UE support of RW                           ADL either performed or assessed with clinical judgement   ADL Overall ADL's : Needs assistance/impaired Eating/Feeding: Set up;Sitting Eating/Feeding Details (indicate cue type and reason): pt finished lunch upon OT arrival                 Lower Body Dressing: Minimal assistance;Sit to/from stand Lower Body Dressing Details (indicate cue type and reason): minA to don over foot Toilet Transfer: Min Designer, jewellery Details (indicate cue type and reason): from EOB to [place] and Hygiene: Min guard;Sit to/from stand       Functional mobility during ADLs: Min guard;Rolling walker General ADL Comments: fatigues easily     Vision   Vision Assessment?: No apparent visual deficits   Perception     Praxis      Cognition Arousal/Alertness: Awake/alert Behavior During Therapy: WFL for tasks assessed/performed;Flat affect Overall Cognitive Status: Within Functional Limits for tasks assessed                                          Exercises Other Exercises Other Exercises: educated pt on  fall prevention strategies with provided handout   Shoulder Instructions       General Comments VSS on RA    Pertinent Vitals/ Pain       Pain Assessment: Faces Faces Pain Scale: Hurts a little bit Pain Location: generalized Pain Descriptors / Indicators: Tiring Pain Intervention(s): Monitored during  session  Home Living                                          Prior Functioning/Environment              Frequency  Min 2X/week        Progress Toward Goals  OT Goals(current goals can now be found in the care plan section)  Progress towards OT goals: Progressing toward goals  Acute Rehab OT Goals Patient Stated Goal: to go home OT Goal Formulation: With patient Time For Goal Achievement: 08/06/21 Potential to Achieve Goals: Good ADL Goals Pt Will Perform Lower Body Bathing: with modified independence Pt Will Perform Lower Body Dressing: with modified independence;sit to/from stand Pt Will Transfer to Toilet: with supervision;ambulating Pt Will Perform Toileting - Clothing Manipulation and hygiene: with supervision;sit to/from stand Additional ADL Goal #1: Pt will independently verbalize 3 strategies to reduce risk of falls  Plan Discharge plan remains appropriate    Co-evaluation                 AM-PAC OT "6 Clicks" Daily Activity     Outcome Measure   Help from another person eating meals?: None Help from another person taking care of personal grooming?: A Little Help from another person toileting, which includes using toliet, bedpan, or urinal?: A Little Help from another person bathing (including washing, rinsing, drying)?: A Little Help from another person to put on and taking off regular upper body clothing?: A Little Help from another person to put on and taking off regular lower body clothing?: A Little 6 Click Score: 19    End of Session Equipment Utilized During Treatment: Gait belt;Rolling walker  OT Visit Diagnosis: Unsteadiness on feet (R26.81);Muscle weakness (generalized) (M62.81)   Activity Tolerance Patient tolerated treatment well   Patient Left with call bell/phone within reach;in chair;with chair alarm set   Nurse Communication Mobility status        Time: 3435-6861 OT Time Calculation (min): 24  min  Charges: OT General Charges $OT Visit: 1 Visit OT Treatments $Self Care/Home Management : 23-37 mins  Helene Kelp OTR/L Acute Rehabilitation Services Office: [place] 07/26/2021, 2:51 PM

## 2021-07-28 DIAGNOSIS — D649 Anemia, unspecified: Secondary | ICD-10-CM | POA: Diagnosis not present

## 2021-07-28 DIAGNOSIS — E1136 Type 2 diabetes mellitus with diabetic cataract: Secondary | ICD-10-CM | POA: Diagnosis not present

## 2021-07-28 DIAGNOSIS — Z9181 History of falling: Secondary | ICD-10-CM | POA: Diagnosis not present

## 2021-07-28 DIAGNOSIS — Z7901 Long term (current) use of anticoagulants: Secondary | ICD-10-CM | POA: Diagnosis not present

## 2021-07-28 DIAGNOSIS — I4891 Unspecified atrial fibrillation: Secondary | ICD-10-CM | POA: Diagnosis not present

## 2021-07-28 DIAGNOSIS — I2510 Atherosclerotic heart disease of native coronary artery without angina pectoris: Secondary | ICD-10-CM | POA: Diagnosis not present

## 2021-07-28 DIAGNOSIS — Z86711 Personal history of pulmonary embolism: Secondary | ICD-10-CM | POA: Diagnosis not present

## 2021-07-28 DIAGNOSIS — G4700 Insomnia, unspecified: Secondary | ICD-10-CM | POA: Diagnosis not present

## 2021-07-28 DIAGNOSIS — Z7982 Long term (current) use of aspirin: Secondary | ICD-10-CM | POA: Diagnosis not present

## 2021-07-28 DIAGNOSIS — Z9981 Dependence on supplemental oxygen: Secondary | ICD-10-CM | POA: Diagnosis not present

## 2021-07-28 DIAGNOSIS — Z7984 Long term (current) use of oral hypoglycemic drugs: Secondary | ICD-10-CM | POA: Diagnosis not present

## 2021-07-28 DIAGNOSIS — Z993 Dependence on wheelchair: Secondary | ICD-10-CM | POA: Diagnosis not present

## 2021-07-28 DIAGNOSIS — Z794 Long term (current) use of insulin: Secondary | ICD-10-CM | POA: Diagnosis not present

## 2021-07-28 DIAGNOSIS — J90 Pleural effusion, not elsewhere classified: Secondary | ICD-10-CM | POA: Diagnosis not present

## 2021-07-28 DIAGNOSIS — I5020 Unspecified systolic (congestive) heart failure: Secondary | ICD-10-CM | POA: Diagnosis not present

## 2021-07-28 DIAGNOSIS — M722 Plantar fascial fibromatosis: Secondary | ICD-10-CM | POA: Diagnosis not present

## 2021-07-28 DIAGNOSIS — K219 Gastro-esophageal reflux disease without esophagitis: Secondary | ICD-10-CM | POA: Diagnosis not present

## 2021-07-28 DIAGNOSIS — E43 Unspecified severe protein-calorie malnutrition: Secondary | ICD-10-CM | POA: Diagnosis not present

## 2021-07-28 DIAGNOSIS — I110 Hypertensive heart disease with heart failure: Secondary | ICD-10-CM | POA: Diagnosis not present

## 2021-07-28 DIAGNOSIS — I252 Old myocardial infarction: Secondary | ICD-10-CM | POA: Diagnosis not present

## 2021-07-28 DIAGNOSIS — M8580 Other specified disorders of bone density and structure, unspecified site: Secondary | ICD-10-CM | POA: Diagnosis not present

## 2021-07-28 DIAGNOSIS — Z931 Gastrostomy status: Secondary | ICD-10-CM | POA: Diagnosis not present

## 2021-07-28 DIAGNOSIS — E785 Hyperlipidemia, unspecified: Secondary | ICD-10-CM | POA: Diagnosis not present

## 2021-07-28 DIAGNOSIS — N179 Acute kidney failure, unspecified: Secondary | ICD-10-CM | POA: Diagnosis not present

## 2021-07-31 ENCOUNTER — Ambulatory Visit (INDEPENDENT_AMBULATORY_CARE_PROVIDER_SITE_OTHER): Payer: Medicare Other | Admitting: Family Medicine

## 2021-07-31 ENCOUNTER — Other Ambulatory Visit: Payer: Self-pay

## 2021-07-31 ENCOUNTER — Encounter: Payer: Self-pay | Admitting: Family Medicine

## 2021-07-31 VITALS — BP 111/75 | HR 75 | Ht 61.0 in | Wt 130.0 lb

## 2021-07-31 DIAGNOSIS — Z9181 History of falling: Secondary | ICD-10-CM | POA: Diagnosis not present

## 2021-07-31 DIAGNOSIS — E785 Hyperlipidemia, unspecified: Secondary | ICD-10-CM | POA: Diagnosis not present

## 2021-07-31 DIAGNOSIS — I252 Old myocardial infarction: Secondary | ICD-10-CM | POA: Diagnosis not present

## 2021-07-31 DIAGNOSIS — M722 Plantar fascial fibromatosis: Secondary | ICD-10-CM | POA: Diagnosis not present

## 2021-07-31 DIAGNOSIS — Z993 Dependence on wheelchair: Secondary | ICD-10-CM | POA: Diagnosis not present

## 2021-07-31 DIAGNOSIS — Z7984 Long term (current) use of oral hypoglycemic drugs: Secondary | ICD-10-CM | POA: Diagnosis not present

## 2021-07-31 DIAGNOSIS — Z7982 Long term (current) use of aspirin: Secondary | ICD-10-CM | POA: Diagnosis not present

## 2021-07-31 DIAGNOSIS — E1136 Type 2 diabetes mellitus with diabetic cataract: Secondary | ICD-10-CM | POA: Diagnosis not present

## 2021-07-31 DIAGNOSIS — K219 Gastro-esophageal reflux disease without esophagitis: Secondary | ICD-10-CM | POA: Diagnosis not present

## 2021-07-31 DIAGNOSIS — I10 Essential (primary) hypertension: Secondary | ICD-10-CM

## 2021-07-31 DIAGNOSIS — I4891 Unspecified atrial fibrillation: Secondary | ICD-10-CM | POA: Diagnosis not present

## 2021-07-31 DIAGNOSIS — E43 Unspecified severe protein-calorie malnutrition: Secondary | ICD-10-CM | POA: Diagnosis not present

## 2021-07-31 DIAGNOSIS — N179 Acute kidney failure, unspecified: Secondary | ICD-10-CM | POA: Diagnosis not present

## 2021-07-31 DIAGNOSIS — M8580 Other specified disorders of bone density and structure, unspecified site: Secondary | ICD-10-CM | POA: Diagnosis not present

## 2021-07-31 DIAGNOSIS — I5020 Unspecified systolic (congestive) heart failure: Secondary | ICD-10-CM | POA: Diagnosis not present

## 2021-07-31 DIAGNOSIS — Z794 Long term (current) use of insulin: Secondary | ICD-10-CM | POA: Diagnosis not present

## 2021-07-31 DIAGNOSIS — Z09 Encounter for follow-up examination after completed treatment for conditions other than malignant neoplasm: Secondary | ICD-10-CM

## 2021-07-31 DIAGNOSIS — Z931 Gastrostomy status: Secondary | ICD-10-CM | POA: Diagnosis not present

## 2021-07-31 DIAGNOSIS — J90 Pleural effusion, not elsewhere classified: Secondary | ICD-10-CM | POA: Diagnosis not present

## 2021-07-31 DIAGNOSIS — I110 Hypertensive heart disease with heart failure: Secondary | ICD-10-CM | POA: Diagnosis not present

## 2021-07-31 DIAGNOSIS — D649 Anemia, unspecified: Secondary | ICD-10-CM | POA: Diagnosis not present

## 2021-07-31 DIAGNOSIS — I2510 Atherosclerotic heart disease of native coronary artery without angina pectoris: Secondary | ICD-10-CM | POA: Diagnosis not present

## 2021-07-31 DIAGNOSIS — G4700 Insomnia, unspecified: Secondary | ICD-10-CM | POA: Diagnosis not present

## 2021-07-31 DIAGNOSIS — Z9981 Dependence on supplemental oxygen: Secondary | ICD-10-CM | POA: Diagnosis not present

## 2021-07-31 DIAGNOSIS — Z86711 Personal history of pulmonary embolism: Secondary | ICD-10-CM | POA: Diagnosis not present

## 2021-07-31 DIAGNOSIS — Z7901 Long term (current) use of anticoagulants: Secondary | ICD-10-CM | POA: Diagnosis not present

## 2021-07-31 NOTE — Progress Notes (Signed)
    SUBJECTIVE:   CHIEF COMPLAINT / HPI:   Dana Coleman is a 80 yo F who presents for hospital follow up. Admitted 10/11-10/14 for shortness of breath. She is no longer short of breath today. She has no concerns today but she wants to get her G-tube taken out. She has not scheduled Cardiology or GI follow up. She was told this was being done for her. States she is taking her increased dose of lasix without issue.   PERTINENT  PMH / PSH: HTN, HFrEF  OBJECTIVE:   BP 111/75   Pulse 75   Ht 5\' 1"  (1.549 m)   Wt 130 lb (59 kg)   SpO2 96%   BMI 24.56 kg/m   General: Appears well sitting in clinic wheel chair, no acute distress. Age appropriate. Cardiac: RRR, normal heart sounds, no murmurs Respiratory: CTAB, normal effort Abdomen: soft, nontender, nondistended Extremities: No edema or cyanosis. Skin: Warm and dry, no rashes noted Neuro: alert and oriented Psych: normal affect  ASSESSMENT/PLAN:   1. Hospital discharge follow-up  -Reassess fluid status- Lungs are clear and no LE edema -Obtained BMET given changes in diuretics- Increased lasix from 20 mg daily to 40 mg daily -Ensure follow-up with Cardiology and GI- Patient seen by Dr. Terrence Dupont and [facility] GI numbers given in AVS to both patient and daughter to call and make follow up appointments. Can talk to GI about Gtube and possible surgery team to remove.  -Repeat CT chest for lung nodules 09/2021 PCP is aware of this.   2. Hypertension Within goal today. On a host of medication. Needs cardiology follow up but also discussed and scheduled appt 10/20 with Dr. Georgina Peer for med rec. Patient has several bag of medication bottles. Concern for polypharmacy is high.   3. Heart failure with reduced ejection fraction (HCC) Increase in lasix will check kidney function - Basic Metabolic Panel  Dana Fee, DO [place]

## 2021-07-31 NOTE — Patient Instructions (Addendum)
Call the cardiology office to make an appointment [phone]  Call the gastroenterologist office to make an appointment [phone]. You can talk with them about G-tube and possible removal. I would talk with them first prior to contacting surgery for removal.   See your appoinment below with Dr. Georgina Peer to go over all of your medications.   Dr. Janus Molder

## 2021-08-01 ENCOUNTER — Telehealth: Payer: Self-pay

## 2021-08-01 ENCOUNTER — Ambulatory Visit: Payer: Medicare Other | Admitting: Pharmacist

## 2021-08-01 DIAGNOSIS — Z7984 Long term (current) use of oral hypoglycemic drugs: Secondary | ICD-10-CM | POA: Diagnosis not present

## 2021-08-01 DIAGNOSIS — I4891 Unspecified atrial fibrillation: Secondary | ICD-10-CM | POA: Diagnosis not present

## 2021-08-01 DIAGNOSIS — I110 Hypertensive heart disease with heart failure: Secondary | ICD-10-CM | POA: Diagnosis not present

## 2021-08-01 DIAGNOSIS — Z931 Gastrostomy status: Secondary | ICD-10-CM | POA: Diagnosis not present

## 2021-08-01 DIAGNOSIS — Z7982 Long term (current) use of aspirin: Secondary | ICD-10-CM | POA: Diagnosis not present

## 2021-08-01 DIAGNOSIS — G4700 Insomnia, unspecified: Secondary | ICD-10-CM | POA: Diagnosis not present

## 2021-08-01 DIAGNOSIS — E785 Hyperlipidemia, unspecified: Secondary | ICD-10-CM | POA: Diagnosis not present

## 2021-08-01 DIAGNOSIS — I5020 Unspecified systolic (congestive) heart failure: Secondary | ICD-10-CM | POA: Diagnosis not present

## 2021-08-01 DIAGNOSIS — E43 Unspecified severe protein-calorie malnutrition: Secondary | ICD-10-CM | POA: Diagnosis not present

## 2021-08-01 DIAGNOSIS — I2510 Atherosclerotic heart disease of native coronary artery without angina pectoris: Secondary | ICD-10-CM | POA: Diagnosis not present

## 2021-08-01 DIAGNOSIS — D649 Anemia, unspecified: Secondary | ICD-10-CM | POA: Diagnosis not present

## 2021-08-01 DIAGNOSIS — Z9981 Dependence on supplemental oxygen: Secondary | ICD-10-CM | POA: Diagnosis not present

## 2021-08-01 DIAGNOSIS — Z794 Long term (current) use of insulin: Secondary | ICD-10-CM | POA: Diagnosis not present

## 2021-08-01 DIAGNOSIS — Z86711 Personal history of pulmonary embolism: Secondary | ICD-10-CM | POA: Diagnosis not present

## 2021-08-01 DIAGNOSIS — Z9181 History of falling: Secondary | ICD-10-CM | POA: Diagnosis not present

## 2021-08-01 DIAGNOSIS — M722 Plantar fascial fibromatosis: Secondary | ICD-10-CM | POA: Diagnosis not present

## 2021-08-01 DIAGNOSIS — J90 Pleural effusion, not elsewhere classified: Secondary | ICD-10-CM | POA: Diagnosis not present

## 2021-08-01 DIAGNOSIS — I252 Old myocardial infarction: Secondary | ICD-10-CM | POA: Diagnosis not present

## 2021-08-01 DIAGNOSIS — Z993 Dependence on wheelchair: Secondary | ICD-10-CM | POA: Diagnosis not present

## 2021-08-01 DIAGNOSIS — Z7901 Long term (current) use of anticoagulants: Secondary | ICD-10-CM | POA: Diagnosis not present

## 2021-08-01 DIAGNOSIS — E1136 Type 2 diabetes mellitus with diabetic cataract: Secondary | ICD-10-CM | POA: Diagnosis not present

## 2021-08-01 DIAGNOSIS — K219 Gastro-esophageal reflux disease without esophagitis: Secondary | ICD-10-CM | POA: Diagnosis not present

## 2021-08-01 DIAGNOSIS — M8580 Other specified disorders of bone density and structure, unspecified site: Secondary | ICD-10-CM | POA: Diagnosis not present

## 2021-08-01 DIAGNOSIS — N179 Acute kidney failure, unspecified: Secondary | ICD-10-CM | POA: Diagnosis not present

## 2021-08-01 LAB — BASIC METABOLIC PANEL
BUN/Creatinine Ratio: 8 — ABNORMAL LOW (ref 12–28)
BUN: 7 mg/dL — ABNORMAL LOW (ref 8–27)
CO2: 18 mmol/L — ABNORMAL LOW (ref 20–29)
Calcium: 9.6 mg/dL (ref 8.7–10.3)
Chloride: 101 mmol/L (ref 96–106)
Creatinine, Ser: 0.88 mg/dL (ref 0.57–1.00)
Glucose: 164 mg/dL — ABNORMAL HIGH (ref 70–99)
Potassium: 3.6 mmol/L (ref 3.5–5.2)
Sodium: 143 mmol/L (ref 134–144)
eGFR: 67 mL/min/{1.73_m2} (ref >59)

## 2021-08-01 NOTE — Telephone Encounter (Signed)
[place] SLP calls nurse line requesting verbal orders for home health speech as follows.   1x a week for 4 weeks.   Verbal orders given.

## 2021-08-05 DIAGNOSIS — M8580 Other specified disorders of bone density and structure, unspecified site: Secondary | ICD-10-CM | POA: Diagnosis not present

## 2021-08-05 DIAGNOSIS — Z7984 Long term (current) use of oral hypoglycemic drugs: Secondary | ICD-10-CM | POA: Diagnosis not present

## 2021-08-05 DIAGNOSIS — J90 Pleural effusion, not elsewhere classified: Secondary | ICD-10-CM | POA: Diagnosis not present

## 2021-08-05 DIAGNOSIS — Z931 Gastrostomy status: Secondary | ICD-10-CM | POA: Diagnosis not present

## 2021-08-05 DIAGNOSIS — E43 Unspecified severe protein-calorie malnutrition: Secondary | ICD-10-CM | POA: Diagnosis not present

## 2021-08-05 DIAGNOSIS — Z9181 History of falling: Secondary | ICD-10-CM | POA: Diagnosis not present

## 2021-08-05 DIAGNOSIS — Z7901 Long term (current) use of anticoagulants: Secondary | ICD-10-CM | POA: Diagnosis not present

## 2021-08-05 DIAGNOSIS — Z794 Long term (current) use of insulin: Secondary | ICD-10-CM | POA: Diagnosis not present

## 2021-08-05 DIAGNOSIS — I5020 Unspecified systolic (congestive) heart failure: Secondary | ICD-10-CM | POA: Diagnosis not present

## 2021-08-05 DIAGNOSIS — K219 Gastro-esophageal reflux disease without esophagitis: Secondary | ICD-10-CM | POA: Diagnosis not present

## 2021-08-05 DIAGNOSIS — D649 Anemia, unspecified: Secondary | ICD-10-CM | POA: Diagnosis not present

## 2021-08-05 DIAGNOSIS — E1136 Type 2 diabetes mellitus with diabetic cataract: Secondary | ICD-10-CM | POA: Diagnosis not present

## 2021-08-05 DIAGNOSIS — M722 Plantar fascial fibromatosis: Secondary | ICD-10-CM | POA: Diagnosis not present

## 2021-08-05 DIAGNOSIS — I4891 Unspecified atrial fibrillation: Secondary | ICD-10-CM | POA: Diagnosis not present

## 2021-08-05 DIAGNOSIS — I252 Old myocardial infarction: Secondary | ICD-10-CM | POA: Diagnosis not present

## 2021-08-05 DIAGNOSIS — N179 Acute kidney failure, unspecified: Secondary | ICD-10-CM | POA: Diagnosis not present

## 2021-08-05 DIAGNOSIS — I2510 Atherosclerotic heart disease of native coronary artery without angina pectoris: Secondary | ICD-10-CM | POA: Diagnosis not present

## 2021-08-05 DIAGNOSIS — G4700 Insomnia, unspecified: Secondary | ICD-10-CM | POA: Diagnosis not present

## 2021-08-05 DIAGNOSIS — Z9981 Dependence on supplemental oxygen: Secondary | ICD-10-CM | POA: Diagnosis not present

## 2021-08-05 DIAGNOSIS — I110 Hypertensive heart disease with heart failure: Secondary | ICD-10-CM | POA: Diagnosis not present

## 2021-08-05 DIAGNOSIS — Z86711 Personal history of pulmonary embolism: Secondary | ICD-10-CM | POA: Diagnosis not present

## 2021-08-05 DIAGNOSIS — Z7982 Long term (current) use of aspirin: Secondary | ICD-10-CM | POA: Diagnosis not present

## 2021-08-05 DIAGNOSIS — E785 Hyperlipidemia, unspecified: Secondary | ICD-10-CM | POA: Diagnosis not present

## 2021-08-05 DIAGNOSIS — Z993 Dependence on wheelchair: Secondary | ICD-10-CM | POA: Diagnosis not present

## 2021-08-07 DIAGNOSIS — Z794 Long term (current) use of insulin: Secondary | ICD-10-CM | POA: Diagnosis not present

## 2021-08-07 DIAGNOSIS — Z7901 Long term (current) use of anticoagulants: Secondary | ICD-10-CM | POA: Diagnosis not present

## 2021-08-07 DIAGNOSIS — I2510 Atherosclerotic heart disease of native coronary artery without angina pectoris: Secondary | ICD-10-CM | POA: Diagnosis not present

## 2021-08-07 DIAGNOSIS — G4700 Insomnia, unspecified: Secondary | ICD-10-CM | POA: Diagnosis not present

## 2021-08-07 DIAGNOSIS — Z7982 Long term (current) use of aspirin: Secondary | ICD-10-CM | POA: Diagnosis not present

## 2021-08-07 DIAGNOSIS — I252 Old myocardial infarction: Secondary | ICD-10-CM | POA: Diagnosis not present

## 2021-08-07 DIAGNOSIS — E1136 Type 2 diabetes mellitus with diabetic cataract: Secondary | ICD-10-CM | POA: Diagnosis not present

## 2021-08-07 DIAGNOSIS — I4891 Unspecified atrial fibrillation: Secondary | ICD-10-CM | POA: Diagnosis not present

## 2021-08-07 DIAGNOSIS — I482 Chronic atrial fibrillation, unspecified: Secondary | ICD-10-CM | POA: Diagnosis not present

## 2021-08-07 DIAGNOSIS — M722 Plantar fascial fibromatosis: Secondary | ICD-10-CM | POA: Diagnosis not present

## 2021-08-07 DIAGNOSIS — Z7984 Long term (current) use of oral hypoglycemic drugs: Secondary | ICD-10-CM | POA: Diagnosis not present

## 2021-08-07 DIAGNOSIS — E785 Hyperlipidemia, unspecified: Secondary | ICD-10-CM | POA: Diagnosis not present

## 2021-08-07 DIAGNOSIS — Z86711 Personal history of pulmonary embolism: Secondary | ICD-10-CM | POA: Diagnosis not present

## 2021-08-07 DIAGNOSIS — E43 Unspecified severe protein-calorie malnutrition: Secondary | ICD-10-CM | POA: Diagnosis not present

## 2021-08-07 DIAGNOSIS — Z9981 Dependence on supplemental oxygen: Secondary | ICD-10-CM | POA: Diagnosis not present

## 2021-08-07 DIAGNOSIS — D649 Anemia, unspecified: Secondary | ICD-10-CM | POA: Diagnosis not present

## 2021-08-07 DIAGNOSIS — N179 Acute kidney failure, unspecified: Secondary | ICD-10-CM | POA: Diagnosis not present

## 2021-08-07 DIAGNOSIS — I5020 Unspecified systolic (congestive) heart failure: Secondary | ICD-10-CM | POA: Diagnosis not present

## 2021-08-07 DIAGNOSIS — Z9181 History of falling: Secondary | ICD-10-CM | POA: Diagnosis not present

## 2021-08-07 DIAGNOSIS — Z993 Dependence on wheelchair: Secondary | ICD-10-CM | POA: Diagnosis not present

## 2021-08-07 DIAGNOSIS — K219 Gastro-esophageal reflux disease without esophagitis: Secondary | ICD-10-CM | POA: Diagnosis not present

## 2021-08-07 DIAGNOSIS — M8580 Other specified disorders of bone density and structure, unspecified site: Secondary | ICD-10-CM | POA: Diagnosis not present

## 2021-08-07 DIAGNOSIS — I110 Hypertensive heart disease with heart failure: Secondary | ICD-10-CM | POA: Diagnosis not present

## 2021-08-07 DIAGNOSIS — J90 Pleural effusion, not elsewhere classified: Secondary | ICD-10-CM | POA: Diagnosis not present

## 2021-08-07 DIAGNOSIS — Z931 Gastrostomy status: Secondary | ICD-10-CM | POA: Diagnosis not present

## 2021-08-08 ENCOUNTER — Ambulatory Visit (INDEPENDENT_AMBULATORY_CARE_PROVIDER_SITE_OTHER): Payer: Medicare Other | Admitting: Pharmacist

## 2021-08-08 ENCOUNTER — Other Ambulatory Visit: Payer: Self-pay

## 2021-08-08 DIAGNOSIS — M722 Plantar fascial fibromatosis: Secondary | ICD-10-CM | POA: Diagnosis not present

## 2021-08-08 DIAGNOSIS — Z0000 Encounter for general adult medical examination without abnormal findings: Secondary | ICD-10-CM | POA: Diagnosis not present

## 2021-08-08 DIAGNOSIS — E43 Unspecified severe protein-calorie malnutrition: Secondary | ICD-10-CM | POA: Diagnosis not present

## 2021-08-08 DIAGNOSIS — Z7901 Long term (current) use of anticoagulants: Secondary | ICD-10-CM | POA: Diagnosis not present

## 2021-08-08 DIAGNOSIS — E1136 Type 2 diabetes mellitus with diabetic cataract: Secondary | ICD-10-CM | POA: Diagnosis not present

## 2021-08-08 DIAGNOSIS — Z86711 Personal history of pulmonary embolism: Secondary | ICD-10-CM | POA: Diagnosis not present

## 2021-08-08 DIAGNOSIS — Z993 Dependence on wheelchair: Secondary | ICD-10-CM | POA: Diagnosis not present

## 2021-08-08 DIAGNOSIS — Z9981 Dependence on supplemental oxygen: Secondary | ICD-10-CM | POA: Diagnosis not present

## 2021-08-08 DIAGNOSIS — Z794 Long term (current) use of insulin: Secondary | ICD-10-CM | POA: Diagnosis not present

## 2021-08-08 DIAGNOSIS — D649 Anemia, unspecified: Secondary | ICD-10-CM | POA: Diagnosis not present

## 2021-08-08 DIAGNOSIS — G4700 Insomnia, unspecified: Secondary | ICD-10-CM | POA: Diagnosis not present

## 2021-08-08 DIAGNOSIS — Z7984 Long term (current) use of oral hypoglycemic drugs: Secondary | ICD-10-CM | POA: Diagnosis not present

## 2021-08-08 DIAGNOSIS — I252 Old myocardial infarction: Secondary | ICD-10-CM | POA: Diagnosis not present

## 2021-08-08 DIAGNOSIS — Z9181 History of falling: Secondary | ICD-10-CM | POA: Diagnosis not present

## 2021-08-08 DIAGNOSIS — K219 Gastro-esophageal reflux disease without esophagitis: Secondary | ICD-10-CM | POA: Diagnosis not present

## 2021-08-08 DIAGNOSIS — Z23 Encounter for immunization: Secondary | ICD-10-CM | POA: Diagnosis not present

## 2021-08-08 DIAGNOSIS — Z931 Gastrostomy status: Secondary | ICD-10-CM | POA: Diagnosis not present

## 2021-08-08 DIAGNOSIS — Z7982 Long term (current) use of aspirin: Secondary | ICD-10-CM | POA: Diagnosis not present

## 2021-08-08 DIAGNOSIS — I4891 Unspecified atrial fibrillation: Secondary | ICD-10-CM | POA: Diagnosis not present

## 2021-08-08 DIAGNOSIS — I110 Hypertensive heart disease with heart failure: Secondary | ICD-10-CM | POA: Diagnosis not present

## 2021-08-08 DIAGNOSIS — I2510 Atherosclerotic heart disease of native coronary artery without angina pectoris: Secondary | ICD-10-CM | POA: Diagnosis not present

## 2021-08-08 DIAGNOSIS — I5020 Unspecified systolic (congestive) heart failure: Secondary | ICD-10-CM | POA: Diagnosis not present

## 2021-08-08 DIAGNOSIS — J90 Pleural effusion, not elsewhere classified: Secondary | ICD-10-CM | POA: Diagnosis not present

## 2021-08-08 DIAGNOSIS — M8580 Other specified disorders of bone density and structure, unspecified site: Secondary | ICD-10-CM | POA: Diagnosis not present

## 2021-08-08 DIAGNOSIS — N179 Acute kidney failure, unspecified: Secondary | ICD-10-CM | POA: Diagnosis not present

## 2021-08-08 DIAGNOSIS — E785 Hyperlipidemia, unspecified: Secondary | ICD-10-CM | POA: Diagnosis not present

## 2021-08-08 NOTE — Patient Instructions (Addendum)
Miss Hatton it was a pleasure seeing you today.   Today we reviewed all of the medications you are currently taking. Included is an updated medication list. Please continue taking all medications as prescribed on this list.  To help you remember to take your medications:  -Filled your pill organizer  You will need to add a 1/2 tablet of spironolactone to Tuesday and Wednesday morning for this week  If you have any questions please call the clinic and ask to speak with me.  Follow-up with Dr. Caron Presume and call to make an appointment

## 2021-08-08 NOTE — Progress Notes (Signed)
   Subjective/Objective:    Patient ID: Dana Coleman, female    DOB: [birth year], 80 y.o.   MRN: [id number]  HPI  Patient is a 80 y.o. female who presents for medication review and management. She is in good spirits and presents with assistance of wheelchair and daughter. Patient was referred and last seen by provider, Dr. Janus Molder on 07/31/21  Medication Adherence Questionnaire (A score of 2 or more points indicates risk for nonadherence)  Do you know what each of your medicines is for? 1 (1 point if no)  Do you ever have trouble remembering to take your medicine? 0 (2 points if yes)  Do you ever not take a medicine because you feel you do not need it?  0 (1 point if yes)  Do you think that any of your medicines is not helping you? 0 (1 point if yes)  Do you have any physical problems such as vision loss that keep you from taking your medicines as prescribed?  0 (2 points if yes)  Do you think any of your medicine is causing a side effect?  0 (1 point if yes)  Do you know the names of ALL of your medicines? 1 (1 point if no)  Do you think that you need ALL of your medicines? 1 (1 point if no)  In the past 6 months, have you missed getting a refill or a new prescription filled on time? 0 (1 point if yes)  How often do you miss taking a dose of medicine? 2 Never (0 points), 1 or 2 times a month (1 points), 1 time a week (2 points), 2 or more times a week (3 points).   TOTAL SCORE 5/14   Patient presented with all of the medications she had at home, including the medications that were discontinued post hospital discharge. Patient reports she has not been taking these medications. Verified this information with her pill organizer that she also brought with her. She states she relies on her home health nurse to fill the pill organizer for her.  Assessment/Plan:   Understanding of regimen: fair  Understanding of indications: poor  Potential of compliance: good  Patient has known  adherence challenges based on score of 5 for questionnaire. Barriers include: lack of knowledge, forgetfulness, and patient heavily relying on assistance to take medications. Of note patient did miss evening dose of medications this past week as pills were still in organizer despite day having passed by already Refilled patient's weekly organizer and moved Jardiance from nighttime slot to daytime slot. Medication list reviewed and updated. Patient was provided with a printed medication list.   Follow-up appointment with PCP in next few weeks. Written patient instructions provided.  This appointment required 50 minutes of direct patient care.  Thank you for involving pharmacy to assist in providing this patient's care.

## 2021-08-09 ENCOUNTER — Encounter: Payer: Self-pay | Admitting: Family Medicine

## 2021-08-10 DIAGNOSIS — D649 Anemia, unspecified: Secondary | ICD-10-CM | POA: Diagnosis not present

## 2021-08-10 DIAGNOSIS — I252 Old myocardial infarction: Secondary | ICD-10-CM | POA: Diagnosis not present

## 2021-08-10 DIAGNOSIS — Z931 Gastrostomy status: Secondary | ICD-10-CM | POA: Diagnosis not present

## 2021-08-10 DIAGNOSIS — K219 Gastro-esophageal reflux disease without esophagitis: Secondary | ICD-10-CM | POA: Diagnosis not present

## 2021-08-10 DIAGNOSIS — Z86711 Personal history of pulmonary embolism: Secondary | ICD-10-CM | POA: Diagnosis not present

## 2021-08-10 DIAGNOSIS — N179 Acute kidney failure, unspecified: Secondary | ICD-10-CM | POA: Diagnosis not present

## 2021-08-10 DIAGNOSIS — Z9981 Dependence on supplemental oxygen: Secondary | ICD-10-CM | POA: Diagnosis not present

## 2021-08-10 DIAGNOSIS — Z7984 Long term (current) use of oral hypoglycemic drugs: Secondary | ICD-10-CM | POA: Diagnosis not present

## 2021-08-10 DIAGNOSIS — I110 Hypertensive heart disease with heart failure: Secondary | ICD-10-CM | POA: Diagnosis not present

## 2021-08-10 DIAGNOSIS — I2510 Atherosclerotic heart disease of native coronary artery without angina pectoris: Secondary | ICD-10-CM | POA: Diagnosis not present

## 2021-08-10 DIAGNOSIS — E43 Unspecified severe protein-calorie malnutrition: Secondary | ICD-10-CM | POA: Diagnosis not present

## 2021-08-10 DIAGNOSIS — Z7982 Long term (current) use of aspirin: Secondary | ICD-10-CM | POA: Diagnosis not present

## 2021-08-10 DIAGNOSIS — Z993 Dependence on wheelchair: Secondary | ICD-10-CM | POA: Diagnosis not present

## 2021-08-10 DIAGNOSIS — E785 Hyperlipidemia, unspecified: Secondary | ICD-10-CM | POA: Diagnosis not present

## 2021-08-10 DIAGNOSIS — M722 Plantar fascial fibromatosis: Secondary | ICD-10-CM | POA: Diagnosis not present

## 2021-08-10 DIAGNOSIS — Z794 Long term (current) use of insulin: Secondary | ICD-10-CM | POA: Diagnosis not present

## 2021-08-10 DIAGNOSIS — G4700 Insomnia, unspecified: Secondary | ICD-10-CM | POA: Diagnosis not present

## 2021-08-10 DIAGNOSIS — J90 Pleural effusion, not elsewhere classified: Secondary | ICD-10-CM | POA: Diagnosis not present

## 2021-08-10 DIAGNOSIS — I4891 Unspecified atrial fibrillation: Secondary | ICD-10-CM | POA: Diagnosis not present

## 2021-08-10 DIAGNOSIS — E1136 Type 2 diabetes mellitus with diabetic cataract: Secondary | ICD-10-CM | POA: Diagnosis not present

## 2021-08-10 DIAGNOSIS — Z9181 History of falling: Secondary | ICD-10-CM | POA: Diagnosis not present

## 2021-08-10 DIAGNOSIS — M8580 Other specified disorders of bone density and structure, unspecified site: Secondary | ICD-10-CM | POA: Diagnosis not present

## 2021-08-10 DIAGNOSIS — I5020 Unspecified systolic (congestive) heart failure: Secondary | ICD-10-CM | POA: Diagnosis not present

## 2021-08-10 DIAGNOSIS — Z7901 Long term (current) use of anticoagulants: Secondary | ICD-10-CM | POA: Diagnosis not present

## 2021-08-12 ENCOUNTER — Telehealth: Payer: Self-pay | Admitting: Internal Medicine

## 2021-08-12 DIAGNOSIS — Z9181 History of falling: Secondary | ICD-10-CM | POA: Diagnosis not present

## 2021-08-12 DIAGNOSIS — M8580 Other specified disorders of bone density and structure, unspecified site: Secondary | ICD-10-CM | POA: Diagnosis not present

## 2021-08-12 DIAGNOSIS — I252 Old myocardial infarction: Secondary | ICD-10-CM | POA: Diagnosis not present

## 2021-08-12 DIAGNOSIS — Z993 Dependence on wheelchair: Secondary | ICD-10-CM | POA: Diagnosis not present

## 2021-08-12 DIAGNOSIS — E1136 Type 2 diabetes mellitus with diabetic cataract: Secondary | ICD-10-CM | POA: Diagnosis not present

## 2021-08-12 DIAGNOSIS — I4891 Unspecified atrial fibrillation: Secondary | ICD-10-CM | POA: Diagnosis not present

## 2021-08-12 DIAGNOSIS — Z7901 Long term (current) use of anticoagulants: Secondary | ICD-10-CM | POA: Diagnosis not present

## 2021-08-12 DIAGNOSIS — Z931 Gastrostomy status: Secondary | ICD-10-CM | POA: Diagnosis not present

## 2021-08-12 DIAGNOSIS — Z9981 Dependence on supplemental oxygen: Secondary | ICD-10-CM | POA: Diagnosis not present

## 2021-08-12 DIAGNOSIS — Z794 Long term (current) use of insulin: Secondary | ICD-10-CM | POA: Diagnosis not present

## 2021-08-12 DIAGNOSIS — J90 Pleural effusion, not elsewhere classified: Secondary | ICD-10-CM | POA: Diagnosis not present

## 2021-08-12 DIAGNOSIS — G4700 Insomnia, unspecified: Secondary | ICD-10-CM | POA: Diagnosis not present

## 2021-08-12 DIAGNOSIS — Z86711 Personal history of pulmonary embolism: Secondary | ICD-10-CM | POA: Diagnosis not present

## 2021-08-12 DIAGNOSIS — E43 Unspecified severe protein-calorie malnutrition: Secondary | ICD-10-CM | POA: Diagnosis not present

## 2021-08-12 DIAGNOSIS — M722 Plantar fascial fibromatosis: Secondary | ICD-10-CM | POA: Diagnosis not present

## 2021-08-12 DIAGNOSIS — E785 Hyperlipidemia, unspecified: Secondary | ICD-10-CM | POA: Diagnosis not present

## 2021-08-12 DIAGNOSIS — Z7982 Long term (current) use of aspirin: Secondary | ICD-10-CM | POA: Diagnosis not present

## 2021-08-12 DIAGNOSIS — I5020 Unspecified systolic (congestive) heart failure: Secondary | ICD-10-CM | POA: Diagnosis not present

## 2021-08-12 DIAGNOSIS — K219 Gastro-esophageal reflux disease without esophagitis: Secondary | ICD-10-CM | POA: Diagnosis not present

## 2021-08-12 DIAGNOSIS — D649 Anemia, unspecified: Secondary | ICD-10-CM | POA: Diagnosis not present

## 2021-08-12 DIAGNOSIS — I2510 Atherosclerotic heart disease of native coronary artery without angina pectoris: Secondary | ICD-10-CM | POA: Diagnosis not present

## 2021-08-12 DIAGNOSIS — I110 Hypertensive heart disease with heart failure: Secondary | ICD-10-CM | POA: Diagnosis not present

## 2021-08-12 DIAGNOSIS — Z7984 Long term (current) use of oral hypoglycemic drugs: Secondary | ICD-10-CM | POA: Diagnosis not present

## 2021-08-12 DIAGNOSIS — N179 Acute kidney failure, unspecified: Secondary | ICD-10-CM | POA: Diagnosis not present

## 2021-08-12 NOTE — Telephone Encounter (Signed)
Patient called from PCP office was calling to follow up on ER discharge referral to contact our office to discuss G tube removal prior to contacting surgeon.

## 2021-08-12 NOTE — Telephone Encounter (Signed)
The tube was placed by interventional radiology so they should be the ones to remove it not me (GI)  If the request is for an appointment to see if it should be removed I think Gifford Shave, MD - PCP should be able to make that decision.  If there is still a need for a GI opinion we can schedule her next available with me or an APP for f/u  I will cc PCP Gifford Shave, MD to get his input

## 2021-08-13 DIAGNOSIS — Z931 Gastrostomy status: Secondary | ICD-10-CM | POA: Diagnosis not present

## 2021-08-13 DIAGNOSIS — I4891 Unspecified atrial fibrillation: Secondary | ICD-10-CM | POA: Diagnosis not present

## 2021-08-13 DIAGNOSIS — E43 Unspecified severe protein-calorie malnutrition: Secondary | ICD-10-CM | POA: Diagnosis not present

## 2021-08-13 DIAGNOSIS — E785 Hyperlipidemia, unspecified: Secondary | ICD-10-CM | POA: Diagnosis not present

## 2021-08-13 DIAGNOSIS — M8580 Other specified disorders of bone density and structure, unspecified site: Secondary | ICD-10-CM | POA: Diagnosis not present

## 2021-08-13 DIAGNOSIS — M722 Plantar fascial fibromatosis: Secondary | ICD-10-CM | POA: Diagnosis not present

## 2021-08-13 DIAGNOSIS — Z7984 Long term (current) use of oral hypoglycemic drugs: Secondary | ICD-10-CM | POA: Diagnosis not present

## 2021-08-13 DIAGNOSIS — Z7982 Long term (current) use of aspirin: Secondary | ICD-10-CM | POA: Diagnosis not present

## 2021-08-13 DIAGNOSIS — D649 Anemia, unspecified: Secondary | ICD-10-CM | POA: Diagnosis not present

## 2021-08-13 DIAGNOSIS — I252 Old myocardial infarction: Secondary | ICD-10-CM | POA: Diagnosis not present

## 2021-08-13 DIAGNOSIS — I2510 Atherosclerotic heart disease of native coronary artery without angina pectoris: Secondary | ICD-10-CM | POA: Diagnosis not present

## 2021-08-13 DIAGNOSIS — Z7901 Long term (current) use of anticoagulants: Secondary | ICD-10-CM | POA: Diagnosis not present

## 2021-08-13 DIAGNOSIS — Z9181 History of falling: Secondary | ICD-10-CM | POA: Diagnosis not present

## 2021-08-13 DIAGNOSIS — I5020 Unspecified systolic (congestive) heart failure: Secondary | ICD-10-CM | POA: Diagnosis not present

## 2021-08-13 DIAGNOSIS — Z86711 Personal history of pulmonary embolism: Secondary | ICD-10-CM | POA: Diagnosis not present

## 2021-08-13 DIAGNOSIS — I110 Hypertensive heart disease with heart failure: Secondary | ICD-10-CM | POA: Diagnosis not present

## 2021-08-13 DIAGNOSIS — E1136 Type 2 diabetes mellitus with diabetic cataract: Secondary | ICD-10-CM | POA: Diagnosis not present

## 2021-08-13 DIAGNOSIS — N179 Acute kidney failure, unspecified: Secondary | ICD-10-CM | POA: Diagnosis not present

## 2021-08-13 DIAGNOSIS — J90 Pleural effusion, not elsewhere classified: Secondary | ICD-10-CM | POA: Diagnosis not present

## 2021-08-13 DIAGNOSIS — Z794 Long term (current) use of insulin: Secondary | ICD-10-CM | POA: Diagnosis not present

## 2021-08-13 DIAGNOSIS — Z9981 Dependence on supplemental oxygen: Secondary | ICD-10-CM | POA: Diagnosis not present

## 2021-08-13 DIAGNOSIS — Z993 Dependence on wheelchair: Secondary | ICD-10-CM | POA: Diagnosis not present

## 2021-08-13 DIAGNOSIS — G4700 Insomnia, unspecified: Secondary | ICD-10-CM | POA: Diagnosis not present

## 2021-08-13 DIAGNOSIS — K219 Gastro-esophageal reflux disease without esophagitis: Secondary | ICD-10-CM | POA: Diagnosis not present

## 2021-08-13 NOTE — Telephone Encounter (Signed)
Called patient to advise she expressed understanding and will contact her PCP.

## 2021-08-14 ENCOUNTER — Telehealth: Payer: Self-pay | Admitting: *Deleted

## 2021-08-14 NOTE — Telephone Encounter (Signed)
Pt calling in wanting pcp to call her. Jeancarlo Leffler Kennon Holter, CMA

## 2021-08-15 DIAGNOSIS — G4700 Insomnia, unspecified: Secondary | ICD-10-CM | POA: Diagnosis not present

## 2021-08-15 DIAGNOSIS — D649 Anemia, unspecified: Secondary | ICD-10-CM | POA: Diagnosis not present

## 2021-08-15 DIAGNOSIS — Z9181 History of falling: Secondary | ICD-10-CM | POA: Diagnosis not present

## 2021-08-15 DIAGNOSIS — Z7984 Long term (current) use of oral hypoglycemic drugs: Secondary | ICD-10-CM | POA: Diagnosis not present

## 2021-08-15 DIAGNOSIS — I4891 Unspecified atrial fibrillation: Secondary | ICD-10-CM | POA: Diagnosis not present

## 2021-08-15 DIAGNOSIS — J90 Pleural effusion, not elsewhere classified: Secondary | ICD-10-CM | POA: Diagnosis not present

## 2021-08-15 DIAGNOSIS — M722 Plantar fascial fibromatosis: Secondary | ICD-10-CM | POA: Diagnosis not present

## 2021-08-15 DIAGNOSIS — Z794 Long term (current) use of insulin: Secondary | ICD-10-CM | POA: Diagnosis not present

## 2021-08-15 DIAGNOSIS — Z86711 Personal history of pulmonary embolism: Secondary | ICD-10-CM | POA: Diagnosis not present

## 2021-08-15 DIAGNOSIS — Z9981 Dependence on supplemental oxygen: Secondary | ICD-10-CM | POA: Diagnosis not present

## 2021-08-15 DIAGNOSIS — Z7982 Long term (current) use of aspirin: Secondary | ICD-10-CM | POA: Diagnosis not present

## 2021-08-15 DIAGNOSIS — E43 Unspecified severe protein-calorie malnutrition: Secondary | ICD-10-CM | POA: Diagnosis not present

## 2021-08-15 DIAGNOSIS — K219 Gastro-esophageal reflux disease without esophagitis: Secondary | ICD-10-CM | POA: Diagnosis not present

## 2021-08-15 DIAGNOSIS — E785 Hyperlipidemia, unspecified: Secondary | ICD-10-CM | POA: Diagnosis not present

## 2021-08-15 DIAGNOSIS — I2510 Atherosclerotic heart disease of native coronary artery without angina pectoris: Secondary | ICD-10-CM | POA: Diagnosis not present

## 2021-08-15 DIAGNOSIS — I5020 Unspecified systolic (congestive) heart failure: Secondary | ICD-10-CM | POA: Diagnosis not present

## 2021-08-15 DIAGNOSIS — M8580 Other specified disorders of bone density and structure, unspecified site: Secondary | ICD-10-CM | POA: Diagnosis not present

## 2021-08-15 DIAGNOSIS — Z931 Gastrostomy status: Secondary | ICD-10-CM | POA: Diagnosis not present

## 2021-08-15 DIAGNOSIS — N179 Acute kidney failure, unspecified: Secondary | ICD-10-CM | POA: Diagnosis not present

## 2021-08-15 DIAGNOSIS — I252 Old myocardial infarction: Secondary | ICD-10-CM | POA: Diagnosis not present

## 2021-08-15 DIAGNOSIS — Z7901 Long term (current) use of anticoagulants: Secondary | ICD-10-CM | POA: Diagnosis not present

## 2021-08-15 DIAGNOSIS — Z993 Dependence on wheelchair: Secondary | ICD-10-CM | POA: Diagnosis not present

## 2021-08-15 DIAGNOSIS — I110 Hypertensive heart disease with heart failure: Secondary | ICD-10-CM | POA: Diagnosis not present

## 2021-08-15 DIAGNOSIS — E1136 Type 2 diabetes mellitus with diabetic cataract: Secondary | ICD-10-CM | POA: Diagnosis not present

## 2021-08-19 ENCOUNTER — Telehealth: Payer: Self-pay

## 2021-08-19 ENCOUNTER — Telehealth: Payer: Self-pay | Admitting: *Deleted

## 2021-08-19 DIAGNOSIS — E785 Hyperlipidemia, unspecified: Secondary | ICD-10-CM | POA: Diagnosis not present

## 2021-08-19 DIAGNOSIS — I5020 Unspecified systolic (congestive) heart failure: Secondary | ICD-10-CM | POA: Diagnosis not present

## 2021-08-19 DIAGNOSIS — Z9981 Dependence on supplemental oxygen: Secondary | ICD-10-CM | POA: Diagnosis not present

## 2021-08-19 DIAGNOSIS — Z9181 History of falling: Secondary | ICD-10-CM | POA: Diagnosis not present

## 2021-08-19 DIAGNOSIS — M722 Plantar fascial fibromatosis: Secondary | ICD-10-CM | POA: Diagnosis not present

## 2021-08-19 DIAGNOSIS — Z7984 Long term (current) use of oral hypoglycemic drugs: Secondary | ICD-10-CM | POA: Diagnosis not present

## 2021-08-19 DIAGNOSIS — Z931 Gastrostomy status: Secondary | ICD-10-CM | POA: Diagnosis not present

## 2021-08-19 DIAGNOSIS — Z7982 Long term (current) use of aspirin: Secondary | ICD-10-CM | POA: Diagnosis not present

## 2021-08-19 DIAGNOSIS — I2510 Atherosclerotic heart disease of native coronary artery without angina pectoris: Secondary | ICD-10-CM | POA: Diagnosis not present

## 2021-08-19 DIAGNOSIS — Z7901 Long term (current) use of anticoagulants: Secondary | ICD-10-CM | POA: Diagnosis not present

## 2021-08-19 DIAGNOSIS — G4700 Insomnia, unspecified: Secondary | ICD-10-CM | POA: Diagnosis not present

## 2021-08-19 DIAGNOSIS — Z794 Long term (current) use of insulin: Secondary | ICD-10-CM | POA: Diagnosis not present

## 2021-08-19 DIAGNOSIS — M8580 Other specified disorders of bone density and structure, unspecified site: Secondary | ICD-10-CM | POA: Diagnosis not present

## 2021-08-19 DIAGNOSIS — I110 Hypertensive heart disease with heart failure: Secondary | ICD-10-CM | POA: Diagnosis not present

## 2021-08-19 DIAGNOSIS — K219 Gastro-esophageal reflux disease without esophagitis: Secondary | ICD-10-CM | POA: Diagnosis not present

## 2021-08-19 DIAGNOSIS — I252 Old myocardial infarction: Secondary | ICD-10-CM | POA: Diagnosis not present

## 2021-08-19 DIAGNOSIS — E43 Unspecified severe protein-calorie malnutrition: Secondary | ICD-10-CM | POA: Diagnosis not present

## 2021-08-19 DIAGNOSIS — N179 Acute kidney failure, unspecified: Secondary | ICD-10-CM | POA: Diagnosis not present

## 2021-08-19 DIAGNOSIS — Z86711 Personal history of pulmonary embolism: Secondary | ICD-10-CM | POA: Diagnosis not present

## 2021-08-19 DIAGNOSIS — D649 Anemia, unspecified: Secondary | ICD-10-CM | POA: Diagnosis not present

## 2021-08-19 DIAGNOSIS — Z993 Dependence on wheelchair: Secondary | ICD-10-CM | POA: Diagnosis not present

## 2021-08-19 DIAGNOSIS — I4891 Unspecified atrial fibrillation: Secondary | ICD-10-CM | POA: Diagnosis not present

## 2021-08-19 DIAGNOSIS — E1136 Type 2 diabetes mellitus with diabetic cataract: Secondary | ICD-10-CM | POA: Diagnosis not present

## 2021-08-19 DIAGNOSIS — J90 Pleural effusion, not elsewhere classified: Secondary | ICD-10-CM | POA: Diagnosis not present

## 2021-08-19 NOTE — Telephone Encounter (Signed)
Patient's [place] Speech Pathologist, Shelda Pal, contacted the office requesting an extension of therapy. Advised Dr. Cyndia Bent signed off on patient in September and to contact patient's PCP who gave verbal orders in October. Phone number provided to providers office.

## 2021-08-19 NOTE — Telephone Encounter (Signed)
[place] SLP calls nurse line requesting verbal orders to extend speech therapy as follows.   1x a week for 3 weeks.   VO given.

## 2021-08-20 ENCOUNTER — Other Ambulatory Visit: Payer: Self-pay

## 2021-08-20 ENCOUNTER — Ambulatory Visit (INDEPENDENT_AMBULATORY_CARE_PROVIDER_SITE_OTHER): Payer: Medicare Other | Admitting: Family Medicine

## 2021-08-20 VITALS — BP 108/62 | HR 63 | Wt 124.6 lb

## 2021-08-20 DIAGNOSIS — I4891 Unspecified atrial fibrillation: Secondary | ICD-10-CM | POA: Diagnosis not present

## 2021-08-20 DIAGNOSIS — Z9981 Dependence on supplemental oxygen: Secondary | ICD-10-CM | POA: Diagnosis not present

## 2021-08-20 DIAGNOSIS — I110 Hypertensive heart disease with heart failure: Secondary | ICD-10-CM | POA: Diagnosis not present

## 2021-08-20 DIAGNOSIS — Z4659 Encounter for fitting and adjustment of other gastrointestinal appliance and device: Secondary | ICD-10-CM

## 2021-08-20 DIAGNOSIS — Z7984 Long term (current) use of oral hypoglycemic drugs: Secondary | ICD-10-CM | POA: Diagnosis not present

## 2021-08-20 DIAGNOSIS — Z931 Gastrostomy status: Secondary | ICD-10-CM | POA: Diagnosis not present

## 2021-08-20 DIAGNOSIS — E43 Unspecified severe protein-calorie malnutrition: Secondary | ICD-10-CM | POA: Diagnosis not present

## 2021-08-20 DIAGNOSIS — G4700 Insomnia, unspecified: Secondary | ICD-10-CM | POA: Diagnosis not present

## 2021-08-20 DIAGNOSIS — E1136 Type 2 diabetes mellitus with diabetic cataract: Secondary | ICD-10-CM | POA: Diagnosis not present

## 2021-08-20 DIAGNOSIS — K219 Gastro-esophageal reflux disease without esophagitis: Secondary | ICD-10-CM | POA: Diagnosis not present

## 2021-08-20 DIAGNOSIS — Z993 Dependence on wheelchair: Secondary | ICD-10-CM | POA: Diagnosis not present

## 2021-08-20 DIAGNOSIS — N179 Acute kidney failure, unspecified: Secondary | ICD-10-CM | POA: Diagnosis not present

## 2021-08-20 DIAGNOSIS — Z794 Long term (current) use of insulin: Secondary | ICD-10-CM | POA: Diagnosis not present

## 2021-08-20 DIAGNOSIS — D649 Anemia, unspecified: Secondary | ICD-10-CM | POA: Diagnosis not present

## 2021-08-20 DIAGNOSIS — M722 Plantar fascial fibromatosis: Secondary | ICD-10-CM | POA: Diagnosis not present

## 2021-08-20 DIAGNOSIS — I2510 Atherosclerotic heart disease of native coronary artery without angina pectoris: Secondary | ICD-10-CM | POA: Diagnosis not present

## 2021-08-20 DIAGNOSIS — Z7982 Long term (current) use of aspirin: Secondary | ICD-10-CM | POA: Diagnosis not present

## 2021-08-20 DIAGNOSIS — I5020 Unspecified systolic (congestive) heart failure: Secondary | ICD-10-CM | POA: Diagnosis not present

## 2021-08-20 DIAGNOSIS — E785 Hyperlipidemia, unspecified: Secondary | ICD-10-CM | POA: Diagnosis not present

## 2021-08-20 DIAGNOSIS — Z7901 Long term (current) use of anticoagulants: Secondary | ICD-10-CM | POA: Diagnosis not present

## 2021-08-20 DIAGNOSIS — I252 Old myocardial infarction: Secondary | ICD-10-CM | POA: Diagnosis not present

## 2021-08-20 DIAGNOSIS — M8580 Other specified disorders of bone density and structure, unspecified site: Secondary | ICD-10-CM | POA: Diagnosis not present

## 2021-08-20 DIAGNOSIS — Z86711 Personal history of pulmonary embolism: Secondary | ICD-10-CM | POA: Diagnosis not present

## 2021-08-20 DIAGNOSIS — J90 Pleural effusion, not elsewhere classified: Secondary | ICD-10-CM | POA: Diagnosis not present

## 2021-08-20 DIAGNOSIS — Z9181 History of falling: Secondary | ICD-10-CM | POA: Diagnosis not present

## 2021-08-20 NOTE — Patient Instructions (Addendum)
It was great to see you!  I'm glad you're doing so much better than when you were in the hospital. From my standpoint, it seems reasonable to remove the G-tube since you haven't needed it in over 1 month. However, we need to make sure you're maintaining your weight.  If you're able to maintain your weight over the next month and still haven't used the G-tube, we can schedule removal with IR (interventional radiology).  I will send a message to Dr. Caron Presume about our visit today. Ultimately, he will make the decision at your next appointment.  Take care and seek immediate care sooner if you develop any concerns.  Dr. [facility]

## 2021-08-20 NOTE — Progress Notes (Signed)
    SUBJECTIVE:   CHIEF COMPLAINT / HPI:   Discuss G-Tube Removal Patient would like her G-tube removed. States she is taking everything by mouth and last used the G-tube about 1 month ago. Reports taking all her medications by mouth without difficulty. No nausea/vomiting. States she has a good appetite- eats 3 meals/day and snacks. She is feeling much better since leaving the hospital. She is back to living alone, although her sister lives across the street and checks on her daily. Granddaughter also checks on her and changes her dressing/flushes G-tube daily or every other day.  PERTINENT  PMH / PSH: HFrEF, CAD, PE, GERD, T2DM, HLD, HTN, depression, empyema following cholecystectomy, esophageal perforation s/p repair, acute renal failure requiring CRRT, A. fib, G-tube, breast cancer history  OBJECTIVE:   BP 108/62   Pulse 63   Wt 124 lb 9.6 oz (56.5 kg)   SpO2 98%   BMI 23.54 kg/m   General: NAD, pleasant, able to participate in exam Cardiac: RRR, normal S1/S2 Respiratory: CTAB, normal effort Abdomen: Bowel sounds present, non-tender, G-tube in place with surrounding dressing c/d/i Extremities: trace pitting edema bilaterally up to mid shin Skin: warm and dry, no rashes noted Neuro: alert, no obvious focal deficits Psych: Normal affect and mood   ASSESSMENT/PLAN:   Gastrostomy tube in place [facility]) Placed 06/03/2021 by interventional radiology. Patient requesting removal. Based on history, it appears patient is doing quite well- has not used the G tube in over 1 month and is tolerating all medications and PO intake without difficulty. However, weight is down 6lbs from prior visit 1 month ago.  GI contacted previously and feels decision should be made by PCP (as GI was not involved in placement). -Ensure patient is able to maintain her weight -Follow-up with PCP in 1 month. If able to maintain weight would recommend removal at that time.   Alcus Dad, MD [place]

## 2021-08-21 DIAGNOSIS — I110 Hypertensive heart disease with heart failure: Secondary | ICD-10-CM | POA: Diagnosis not present

## 2021-08-21 DIAGNOSIS — I5020 Unspecified systolic (congestive) heart failure: Secondary | ICD-10-CM | POA: Diagnosis not present

## 2021-08-21 DIAGNOSIS — D649 Anemia, unspecified: Secondary | ICD-10-CM | POA: Diagnosis not present

## 2021-08-21 DIAGNOSIS — M722 Plantar fascial fibromatosis: Secondary | ICD-10-CM | POA: Diagnosis not present

## 2021-08-21 DIAGNOSIS — Z794 Long term (current) use of insulin: Secondary | ICD-10-CM | POA: Diagnosis not present

## 2021-08-21 DIAGNOSIS — M8580 Other specified disorders of bone density and structure, unspecified site: Secondary | ICD-10-CM | POA: Diagnosis not present

## 2021-08-21 DIAGNOSIS — Z9181 History of falling: Secondary | ICD-10-CM | POA: Diagnosis not present

## 2021-08-21 DIAGNOSIS — E1136 Type 2 diabetes mellitus with diabetic cataract: Secondary | ICD-10-CM | POA: Diagnosis not present

## 2021-08-21 DIAGNOSIS — I4891 Unspecified atrial fibrillation: Secondary | ICD-10-CM | POA: Diagnosis not present

## 2021-08-21 DIAGNOSIS — G4700 Insomnia, unspecified: Secondary | ICD-10-CM | POA: Diagnosis not present

## 2021-08-21 DIAGNOSIS — Z86711 Personal history of pulmonary embolism: Secondary | ICD-10-CM | POA: Diagnosis not present

## 2021-08-21 DIAGNOSIS — Z7901 Long term (current) use of anticoagulants: Secondary | ICD-10-CM | POA: Diagnosis not present

## 2021-08-21 DIAGNOSIS — I2510 Atherosclerotic heart disease of native coronary artery without angina pectoris: Secondary | ICD-10-CM | POA: Diagnosis not present

## 2021-08-21 DIAGNOSIS — J90 Pleural effusion, not elsewhere classified: Secondary | ICD-10-CM | POA: Diagnosis not present

## 2021-08-21 DIAGNOSIS — Z931 Gastrostomy status: Secondary | ICD-10-CM | POA: Diagnosis not present

## 2021-08-21 DIAGNOSIS — Z9981 Dependence on supplemental oxygen: Secondary | ICD-10-CM | POA: Diagnosis not present

## 2021-08-21 DIAGNOSIS — E785 Hyperlipidemia, unspecified: Secondary | ICD-10-CM | POA: Diagnosis not present

## 2021-08-21 DIAGNOSIS — Z993 Dependence on wheelchair: Secondary | ICD-10-CM | POA: Diagnosis not present

## 2021-08-21 DIAGNOSIS — E43 Unspecified severe protein-calorie malnutrition: Secondary | ICD-10-CM | POA: Diagnosis not present

## 2021-08-21 DIAGNOSIS — Z7982 Long term (current) use of aspirin: Secondary | ICD-10-CM | POA: Diagnosis not present

## 2021-08-21 DIAGNOSIS — I252 Old myocardial infarction: Secondary | ICD-10-CM | POA: Diagnosis not present

## 2021-08-21 DIAGNOSIS — K219 Gastro-esophageal reflux disease without esophagitis: Secondary | ICD-10-CM | POA: Diagnosis not present

## 2021-08-21 DIAGNOSIS — N179 Acute kidney failure, unspecified: Secondary | ICD-10-CM | POA: Diagnosis not present

## 2021-08-21 DIAGNOSIS — Z7984 Long term (current) use of oral hypoglycemic drugs: Secondary | ICD-10-CM | POA: Diagnosis not present

## 2021-08-21 HISTORY — DX: Gastrostomy status: Z93.1

## 2021-08-21 NOTE — Assessment & Plan Note (Addendum)
Placed 06/03/2021 by interventional radiology. Patient requesting removal. Based on history, it appears patient is doing quite well- has not used the G tube in over 1 month and is tolerating all medications and PO intake without difficulty. However, weight is down 6lbs from prior visit 1 month ago.  GI contacted previously and feels decision should be made by PCP (as GI was not involved in placement). -Ensure patient is able to maintain her weight -Follow-up with PCP in 1 month. If able to maintain weight would recommend removal at that time.

## 2021-08-23 DIAGNOSIS — E1136 Type 2 diabetes mellitus with diabetic cataract: Secondary | ICD-10-CM | POA: Diagnosis not present

## 2021-08-23 DIAGNOSIS — E43 Unspecified severe protein-calorie malnutrition: Secondary | ICD-10-CM | POA: Diagnosis not present

## 2021-08-23 DIAGNOSIS — Z7984 Long term (current) use of oral hypoglycemic drugs: Secondary | ICD-10-CM | POA: Diagnosis not present

## 2021-08-23 DIAGNOSIS — M722 Plantar fascial fibromatosis: Secondary | ICD-10-CM | POA: Diagnosis not present

## 2021-08-23 DIAGNOSIS — Z931 Gastrostomy status: Secondary | ICD-10-CM | POA: Diagnosis not present

## 2021-08-23 DIAGNOSIS — Z9981 Dependence on supplemental oxygen: Secondary | ICD-10-CM | POA: Diagnosis not present

## 2021-08-23 DIAGNOSIS — I4891 Unspecified atrial fibrillation: Secondary | ICD-10-CM | POA: Diagnosis not present

## 2021-08-23 DIAGNOSIS — D649 Anemia, unspecified: Secondary | ICD-10-CM | POA: Diagnosis not present

## 2021-08-23 DIAGNOSIS — Z86711 Personal history of pulmonary embolism: Secondary | ICD-10-CM | POA: Diagnosis not present

## 2021-08-23 DIAGNOSIS — Z7982 Long term (current) use of aspirin: Secondary | ICD-10-CM | POA: Diagnosis not present

## 2021-08-23 DIAGNOSIS — Z794 Long term (current) use of insulin: Secondary | ICD-10-CM | POA: Diagnosis not present

## 2021-08-23 DIAGNOSIS — I2510 Atherosclerotic heart disease of native coronary artery without angina pectoris: Secondary | ICD-10-CM | POA: Diagnosis not present

## 2021-08-23 DIAGNOSIS — G4700 Insomnia, unspecified: Secondary | ICD-10-CM | POA: Diagnosis not present

## 2021-08-23 DIAGNOSIS — I110 Hypertensive heart disease with heart failure: Secondary | ICD-10-CM | POA: Diagnosis not present

## 2021-08-23 DIAGNOSIS — Z7901 Long term (current) use of anticoagulants: Secondary | ICD-10-CM | POA: Diagnosis not present

## 2021-08-23 DIAGNOSIS — N179 Acute kidney failure, unspecified: Secondary | ICD-10-CM | POA: Diagnosis not present

## 2021-08-23 DIAGNOSIS — J90 Pleural effusion, not elsewhere classified: Secondary | ICD-10-CM | POA: Diagnosis not present

## 2021-08-23 DIAGNOSIS — I5020 Unspecified systolic (congestive) heart failure: Secondary | ICD-10-CM | POA: Diagnosis not present

## 2021-08-23 DIAGNOSIS — E785 Hyperlipidemia, unspecified: Secondary | ICD-10-CM | POA: Diagnosis not present

## 2021-08-23 DIAGNOSIS — Z993 Dependence on wheelchair: Secondary | ICD-10-CM | POA: Diagnosis not present

## 2021-08-23 DIAGNOSIS — I252 Old myocardial infarction: Secondary | ICD-10-CM | POA: Diagnosis not present

## 2021-08-23 DIAGNOSIS — K219 Gastro-esophageal reflux disease without esophagitis: Secondary | ICD-10-CM | POA: Diagnosis not present

## 2021-08-23 DIAGNOSIS — M8580 Other specified disorders of bone density and structure, unspecified site: Secondary | ICD-10-CM | POA: Diagnosis not present

## 2021-08-23 DIAGNOSIS — Z9181 History of falling: Secondary | ICD-10-CM | POA: Diagnosis not present

## 2021-08-26 DIAGNOSIS — E785 Hyperlipidemia, unspecified: Secondary | ICD-10-CM | POA: Diagnosis not present

## 2021-08-26 DIAGNOSIS — K219 Gastro-esophageal reflux disease without esophagitis: Secondary | ICD-10-CM | POA: Diagnosis not present

## 2021-08-26 DIAGNOSIS — I4891 Unspecified atrial fibrillation: Secondary | ICD-10-CM | POA: Diagnosis not present

## 2021-08-26 DIAGNOSIS — I509 Heart failure, unspecified: Secondary | ICD-10-CM | POA: Diagnosis not present

## 2021-08-26 DIAGNOSIS — J90 Pleural effusion, not elsewhere classified: Secondary | ICD-10-CM | POA: Diagnosis not present

## 2021-08-26 DIAGNOSIS — I110 Hypertensive heart disease with heart failure: Secondary | ICD-10-CM | POA: Diagnosis not present

## 2021-08-26 DIAGNOSIS — Z86711 Personal history of pulmonary embolism: Secondary | ICD-10-CM | POA: Diagnosis not present

## 2021-08-26 DIAGNOSIS — Z9981 Dependence on supplemental oxygen: Secondary | ICD-10-CM | POA: Diagnosis not present

## 2021-08-26 DIAGNOSIS — D649 Anemia, unspecified: Secondary | ICD-10-CM | POA: Diagnosis not present

## 2021-08-26 DIAGNOSIS — Z7901 Long term (current) use of anticoagulants: Secondary | ICD-10-CM | POA: Diagnosis not present

## 2021-08-26 DIAGNOSIS — Z7984 Long term (current) use of oral hypoglycemic drugs: Secondary | ICD-10-CM | POA: Diagnosis not present

## 2021-08-26 DIAGNOSIS — Z993 Dependence on wheelchair: Secondary | ICD-10-CM | POA: Diagnosis not present

## 2021-08-26 DIAGNOSIS — Z794 Long term (current) use of insulin: Secondary | ICD-10-CM | POA: Diagnosis not present

## 2021-08-26 DIAGNOSIS — N179 Acute kidney failure, unspecified: Secondary | ICD-10-CM | POA: Diagnosis not present

## 2021-08-26 DIAGNOSIS — G4700 Insomnia, unspecified: Secondary | ICD-10-CM | POA: Diagnosis not present

## 2021-08-26 DIAGNOSIS — I5020 Unspecified systolic (congestive) heart failure: Secondary | ICD-10-CM | POA: Diagnosis not present

## 2021-08-26 DIAGNOSIS — M8580 Other specified disorders of bone density and structure, unspecified site: Secondary | ICD-10-CM | POA: Diagnosis not present

## 2021-08-26 DIAGNOSIS — Z9181 History of falling: Secondary | ICD-10-CM | POA: Diagnosis not present

## 2021-08-26 DIAGNOSIS — M722 Plantar fascial fibromatosis: Secondary | ICD-10-CM | POA: Diagnosis not present

## 2021-08-26 DIAGNOSIS — E43 Unspecified severe protein-calorie malnutrition: Secondary | ICD-10-CM | POA: Diagnosis not present

## 2021-08-26 DIAGNOSIS — E1136 Type 2 diabetes mellitus with diabetic cataract: Secondary | ICD-10-CM | POA: Diagnosis not present

## 2021-08-26 DIAGNOSIS — Z931 Gastrostomy status: Secondary | ICD-10-CM | POA: Diagnosis not present

## 2021-08-26 DIAGNOSIS — I252 Old myocardial infarction: Secondary | ICD-10-CM | POA: Diagnosis not present

## 2021-08-26 DIAGNOSIS — Z7982 Long term (current) use of aspirin: Secondary | ICD-10-CM | POA: Diagnosis not present

## 2021-08-26 DIAGNOSIS — I2510 Atherosclerotic heart disease of native coronary artery without angina pectoris: Secondary | ICD-10-CM | POA: Diagnosis not present

## 2021-08-27 ENCOUNTER — Telehealth: Payer: Self-pay

## 2021-08-27 DIAGNOSIS — M8580 Other specified disorders of bone density and structure, unspecified site: Secondary | ICD-10-CM | POA: Diagnosis not present

## 2021-08-27 DIAGNOSIS — G4700 Insomnia, unspecified: Secondary | ICD-10-CM | POA: Diagnosis not present

## 2021-08-27 DIAGNOSIS — Z7901 Long term (current) use of anticoagulants: Secondary | ICD-10-CM | POA: Diagnosis not present

## 2021-08-27 DIAGNOSIS — Z993 Dependence on wheelchair: Secondary | ICD-10-CM | POA: Diagnosis not present

## 2021-08-27 DIAGNOSIS — K219 Gastro-esophageal reflux disease without esophagitis: Secondary | ICD-10-CM | POA: Diagnosis not present

## 2021-08-27 DIAGNOSIS — I252 Old myocardial infarction: Secondary | ICD-10-CM | POA: Diagnosis not present

## 2021-08-27 DIAGNOSIS — I2510 Atherosclerotic heart disease of native coronary artery without angina pectoris: Secondary | ICD-10-CM | POA: Diagnosis not present

## 2021-08-27 DIAGNOSIS — Z7982 Long term (current) use of aspirin: Secondary | ICD-10-CM | POA: Diagnosis not present

## 2021-08-27 DIAGNOSIS — M722 Plantar fascial fibromatosis: Secondary | ICD-10-CM | POA: Diagnosis not present

## 2021-08-27 DIAGNOSIS — Z794 Long term (current) use of insulin: Secondary | ICD-10-CM | POA: Diagnosis not present

## 2021-08-27 DIAGNOSIS — I110 Hypertensive heart disease with heart failure: Secondary | ICD-10-CM | POA: Diagnosis not present

## 2021-08-27 DIAGNOSIS — D649 Anemia, unspecified: Secondary | ICD-10-CM | POA: Diagnosis not present

## 2021-08-27 DIAGNOSIS — Z86711 Personal history of pulmonary embolism: Secondary | ICD-10-CM | POA: Diagnosis not present

## 2021-08-27 DIAGNOSIS — Z931 Gastrostomy status: Secondary | ICD-10-CM | POA: Diagnosis not present

## 2021-08-27 DIAGNOSIS — I5020 Unspecified systolic (congestive) heart failure: Secondary | ICD-10-CM | POA: Diagnosis not present

## 2021-08-27 DIAGNOSIS — N179 Acute kidney failure, unspecified: Secondary | ICD-10-CM | POA: Diagnosis not present

## 2021-08-27 DIAGNOSIS — J90 Pleural effusion, not elsewhere classified: Secondary | ICD-10-CM | POA: Diagnosis not present

## 2021-08-27 DIAGNOSIS — E785 Hyperlipidemia, unspecified: Secondary | ICD-10-CM | POA: Diagnosis not present

## 2021-08-27 DIAGNOSIS — Z7984 Long term (current) use of oral hypoglycemic drugs: Secondary | ICD-10-CM | POA: Diagnosis not present

## 2021-08-27 DIAGNOSIS — I4891 Unspecified atrial fibrillation: Secondary | ICD-10-CM | POA: Diagnosis not present

## 2021-08-27 DIAGNOSIS — Z9981 Dependence on supplemental oxygen: Secondary | ICD-10-CM | POA: Diagnosis not present

## 2021-08-27 DIAGNOSIS — Z9181 History of falling: Secondary | ICD-10-CM | POA: Diagnosis not present

## 2021-08-27 DIAGNOSIS — E43 Unspecified severe protein-calorie malnutrition: Secondary | ICD-10-CM | POA: Diagnosis not present

## 2021-08-27 DIAGNOSIS — E1136 Type 2 diabetes mellitus with diabetic cataract: Secondary | ICD-10-CM | POA: Diagnosis not present

## 2021-08-27 NOTE — Telephone Encounter (Signed)
Amy [facility] nurse calls nurse line requesting verbal orders for [facility] nursing as follows.   1x a week for 5 weeks.   Verbal orders given per [facility] protocol.

## 2021-08-29 DIAGNOSIS — Z9981 Dependence on supplemental oxygen: Secondary | ICD-10-CM | POA: Diagnosis not present

## 2021-08-29 DIAGNOSIS — E1136 Type 2 diabetes mellitus with diabetic cataract: Secondary | ICD-10-CM | POA: Diagnosis not present

## 2021-08-29 DIAGNOSIS — K219 Gastro-esophageal reflux disease without esophagitis: Secondary | ICD-10-CM | POA: Diagnosis not present

## 2021-08-29 DIAGNOSIS — Z7984 Long term (current) use of oral hypoglycemic drugs: Secondary | ICD-10-CM | POA: Diagnosis not present

## 2021-08-29 DIAGNOSIS — I110 Hypertensive heart disease with heart failure: Secondary | ICD-10-CM | POA: Diagnosis not present

## 2021-08-29 DIAGNOSIS — M8580 Other specified disorders of bone density and structure, unspecified site: Secondary | ICD-10-CM | POA: Diagnosis not present

## 2021-08-29 DIAGNOSIS — J90 Pleural effusion, not elsewhere classified: Secondary | ICD-10-CM | POA: Diagnosis not present

## 2021-08-29 DIAGNOSIS — E43 Unspecified severe protein-calorie malnutrition: Secondary | ICD-10-CM | POA: Diagnosis not present

## 2021-08-29 DIAGNOSIS — Z931 Gastrostomy status: Secondary | ICD-10-CM | POA: Diagnosis not present

## 2021-08-29 DIAGNOSIS — I252 Old myocardial infarction: Secondary | ICD-10-CM | POA: Diagnosis not present

## 2021-08-29 DIAGNOSIS — E785 Hyperlipidemia, unspecified: Secondary | ICD-10-CM | POA: Diagnosis not present

## 2021-08-29 DIAGNOSIS — Z794 Long term (current) use of insulin: Secondary | ICD-10-CM | POA: Diagnosis not present

## 2021-08-29 DIAGNOSIS — Z7982 Long term (current) use of aspirin: Secondary | ICD-10-CM | POA: Diagnosis not present

## 2021-08-29 DIAGNOSIS — I5020 Unspecified systolic (congestive) heart failure: Secondary | ICD-10-CM | POA: Diagnosis not present

## 2021-08-29 DIAGNOSIS — Z86711 Personal history of pulmonary embolism: Secondary | ICD-10-CM | POA: Diagnosis not present

## 2021-08-29 DIAGNOSIS — M722 Plantar fascial fibromatosis: Secondary | ICD-10-CM | POA: Diagnosis not present

## 2021-08-29 DIAGNOSIS — I4891 Unspecified atrial fibrillation: Secondary | ICD-10-CM | POA: Diagnosis not present

## 2021-08-29 DIAGNOSIS — I2510 Atherosclerotic heart disease of native coronary artery without angina pectoris: Secondary | ICD-10-CM | POA: Diagnosis not present

## 2021-08-29 DIAGNOSIS — Z993 Dependence on wheelchair: Secondary | ICD-10-CM | POA: Diagnosis not present

## 2021-08-29 DIAGNOSIS — Z7901 Long term (current) use of anticoagulants: Secondary | ICD-10-CM | POA: Diagnosis not present

## 2021-08-29 DIAGNOSIS — Z9181 History of falling: Secondary | ICD-10-CM | POA: Diagnosis not present

## 2021-08-29 DIAGNOSIS — G4700 Insomnia, unspecified: Secondary | ICD-10-CM | POA: Diagnosis not present

## 2021-08-29 DIAGNOSIS — N179 Acute kidney failure, unspecified: Secondary | ICD-10-CM | POA: Diagnosis not present

## 2021-08-29 DIAGNOSIS — D649 Anemia, unspecified: Secondary | ICD-10-CM | POA: Diagnosis not present

## 2021-09-02 DIAGNOSIS — I252 Old myocardial infarction: Secondary | ICD-10-CM | POA: Diagnosis not present

## 2021-09-02 DIAGNOSIS — D649 Anemia, unspecified: Secondary | ICD-10-CM | POA: Diagnosis not present

## 2021-09-02 DIAGNOSIS — Z7984 Long term (current) use of oral hypoglycemic drugs: Secondary | ICD-10-CM | POA: Diagnosis not present

## 2021-09-02 DIAGNOSIS — Z9181 History of falling: Secondary | ICD-10-CM | POA: Diagnosis not present

## 2021-09-02 DIAGNOSIS — M722 Plantar fascial fibromatosis: Secondary | ICD-10-CM | POA: Diagnosis not present

## 2021-09-02 DIAGNOSIS — E1136 Type 2 diabetes mellitus with diabetic cataract: Secondary | ICD-10-CM | POA: Diagnosis not present

## 2021-09-02 DIAGNOSIS — Z7982 Long term (current) use of aspirin: Secondary | ICD-10-CM | POA: Diagnosis not present

## 2021-09-02 DIAGNOSIS — I4891 Unspecified atrial fibrillation: Secondary | ICD-10-CM | POA: Diagnosis not present

## 2021-09-02 DIAGNOSIS — G4700 Insomnia, unspecified: Secondary | ICD-10-CM | POA: Diagnosis not present

## 2021-09-02 DIAGNOSIS — Z7901 Long term (current) use of anticoagulants: Secondary | ICD-10-CM | POA: Diagnosis not present

## 2021-09-02 DIAGNOSIS — Z9981 Dependence on supplemental oxygen: Secondary | ICD-10-CM | POA: Diagnosis not present

## 2021-09-02 DIAGNOSIS — M8580 Other specified disorders of bone density and structure, unspecified site: Secondary | ICD-10-CM | POA: Diagnosis not present

## 2021-09-02 DIAGNOSIS — I2510 Atherosclerotic heart disease of native coronary artery without angina pectoris: Secondary | ICD-10-CM | POA: Diagnosis not present

## 2021-09-02 DIAGNOSIS — Z993 Dependence on wheelchair: Secondary | ICD-10-CM | POA: Diagnosis not present

## 2021-09-02 DIAGNOSIS — N179 Acute kidney failure, unspecified: Secondary | ICD-10-CM | POA: Diagnosis not present

## 2021-09-02 DIAGNOSIS — Z86711 Personal history of pulmonary embolism: Secondary | ICD-10-CM | POA: Diagnosis not present

## 2021-09-02 DIAGNOSIS — K219 Gastro-esophageal reflux disease without esophagitis: Secondary | ICD-10-CM | POA: Diagnosis not present

## 2021-09-02 DIAGNOSIS — I110 Hypertensive heart disease with heart failure: Secondary | ICD-10-CM | POA: Diagnosis not present

## 2021-09-02 DIAGNOSIS — Z931 Gastrostomy status: Secondary | ICD-10-CM | POA: Diagnosis not present

## 2021-09-02 DIAGNOSIS — E43 Unspecified severe protein-calorie malnutrition: Secondary | ICD-10-CM | POA: Diagnosis not present

## 2021-09-02 DIAGNOSIS — Z794 Long term (current) use of insulin: Secondary | ICD-10-CM | POA: Diagnosis not present

## 2021-09-02 DIAGNOSIS — J90 Pleural effusion, not elsewhere classified: Secondary | ICD-10-CM | POA: Diagnosis not present

## 2021-09-02 DIAGNOSIS — E785 Hyperlipidemia, unspecified: Secondary | ICD-10-CM | POA: Diagnosis not present

## 2021-09-02 DIAGNOSIS — I5020 Unspecified systolic (congestive) heart failure: Secondary | ICD-10-CM | POA: Diagnosis not present

## 2021-09-03 DIAGNOSIS — I110 Hypertensive heart disease with heart failure: Secondary | ICD-10-CM | POA: Diagnosis not present

## 2021-09-03 DIAGNOSIS — I2510 Atherosclerotic heart disease of native coronary artery without angina pectoris: Secondary | ICD-10-CM | POA: Diagnosis not present

## 2021-09-03 DIAGNOSIS — M722 Plantar fascial fibromatosis: Secondary | ICD-10-CM | POA: Diagnosis not present

## 2021-09-03 DIAGNOSIS — M8580 Other specified disorders of bone density and structure, unspecified site: Secondary | ICD-10-CM | POA: Diagnosis not present

## 2021-09-03 DIAGNOSIS — Z993 Dependence on wheelchair: Secondary | ICD-10-CM | POA: Diagnosis not present

## 2021-09-03 DIAGNOSIS — Z9981 Dependence on supplemental oxygen: Secondary | ICD-10-CM | POA: Diagnosis not present

## 2021-09-03 DIAGNOSIS — I252 Old myocardial infarction: Secondary | ICD-10-CM | POA: Diagnosis not present

## 2021-09-03 DIAGNOSIS — Z7982 Long term (current) use of aspirin: Secondary | ICD-10-CM | POA: Diagnosis not present

## 2021-09-03 DIAGNOSIS — Z794 Long term (current) use of insulin: Secondary | ICD-10-CM | POA: Diagnosis not present

## 2021-09-03 DIAGNOSIS — Z931 Gastrostomy status: Secondary | ICD-10-CM | POA: Diagnosis not present

## 2021-09-03 DIAGNOSIS — I509 Heart failure, unspecified: Secondary | ICD-10-CM | POA: Diagnosis not present

## 2021-09-03 DIAGNOSIS — E785 Hyperlipidemia, unspecified: Secondary | ICD-10-CM | POA: Diagnosis not present

## 2021-09-03 DIAGNOSIS — E43 Unspecified severe protein-calorie malnutrition: Secondary | ICD-10-CM | POA: Diagnosis not present

## 2021-09-03 DIAGNOSIS — Z9181 History of falling: Secondary | ICD-10-CM | POA: Diagnosis not present

## 2021-09-03 DIAGNOSIS — Z7901 Long term (current) use of anticoagulants: Secondary | ICD-10-CM | POA: Diagnosis not present

## 2021-09-03 DIAGNOSIS — I4891 Unspecified atrial fibrillation: Secondary | ICD-10-CM | POA: Diagnosis not present

## 2021-09-03 DIAGNOSIS — G4700 Insomnia, unspecified: Secondary | ICD-10-CM | POA: Diagnosis not present

## 2021-09-03 DIAGNOSIS — D649 Anemia, unspecified: Secondary | ICD-10-CM | POA: Diagnosis not present

## 2021-09-03 DIAGNOSIS — K219 Gastro-esophageal reflux disease without esophagitis: Secondary | ICD-10-CM | POA: Diagnosis not present

## 2021-09-03 DIAGNOSIS — Z86711 Personal history of pulmonary embolism: Secondary | ICD-10-CM | POA: Diagnosis not present

## 2021-09-03 DIAGNOSIS — E1136 Type 2 diabetes mellitus with diabetic cataract: Secondary | ICD-10-CM | POA: Diagnosis not present

## 2021-09-03 DIAGNOSIS — E876 Hypokalemia: Secondary | ICD-10-CM | POA: Diagnosis not present

## 2021-09-03 DIAGNOSIS — I5020 Unspecified systolic (congestive) heart failure: Secondary | ICD-10-CM | POA: Diagnosis not present

## 2021-09-03 DIAGNOSIS — Z7984 Long term (current) use of oral hypoglycemic drugs: Secondary | ICD-10-CM | POA: Diagnosis not present

## 2021-09-03 DIAGNOSIS — N179 Acute kidney failure, unspecified: Secondary | ICD-10-CM | POA: Diagnosis not present

## 2021-09-03 DIAGNOSIS — J90 Pleural effusion, not elsewhere classified: Secondary | ICD-10-CM | POA: Diagnosis not present

## 2021-09-09 DIAGNOSIS — Z993 Dependence on wheelchair: Secondary | ICD-10-CM | POA: Diagnosis not present

## 2021-09-09 DIAGNOSIS — I4891 Unspecified atrial fibrillation: Secondary | ICD-10-CM | POA: Diagnosis not present

## 2021-09-09 DIAGNOSIS — G4700 Insomnia, unspecified: Secondary | ICD-10-CM | POA: Diagnosis not present

## 2021-09-09 DIAGNOSIS — E785 Hyperlipidemia, unspecified: Secondary | ICD-10-CM | POA: Diagnosis not present

## 2021-09-09 DIAGNOSIS — J90 Pleural effusion, not elsewhere classified: Secondary | ICD-10-CM | POA: Diagnosis not present

## 2021-09-09 DIAGNOSIS — I2510 Atherosclerotic heart disease of native coronary artery without angina pectoris: Secondary | ICD-10-CM | POA: Diagnosis not present

## 2021-09-09 DIAGNOSIS — N179 Acute kidney failure, unspecified: Secondary | ICD-10-CM | POA: Diagnosis not present

## 2021-09-09 DIAGNOSIS — M8580 Other specified disorders of bone density and structure, unspecified site: Secondary | ICD-10-CM | POA: Diagnosis not present

## 2021-09-09 DIAGNOSIS — Z931 Gastrostomy status: Secondary | ICD-10-CM | POA: Diagnosis not present

## 2021-09-09 DIAGNOSIS — I110 Hypertensive heart disease with heart failure: Secondary | ICD-10-CM | POA: Diagnosis not present

## 2021-09-09 DIAGNOSIS — Z794 Long term (current) use of insulin: Secondary | ICD-10-CM | POA: Diagnosis not present

## 2021-09-09 DIAGNOSIS — Z9981 Dependence on supplemental oxygen: Secondary | ICD-10-CM | POA: Diagnosis not present

## 2021-09-09 DIAGNOSIS — M722 Plantar fascial fibromatosis: Secondary | ICD-10-CM | POA: Diagnosis not present

## 2021-09-09 DIAGNOSIS — Z7901 Long term (current) use of anticoagulants: Secondary | ICD-10-CM | POA: Diagnosis not present

## 2021-09-09 DIAGNOSIS — I5020 Unspecified systolic (congestive) heart failure: Secondary | ICD-10-CM | POA: Diagnosis not present

## 2021-09-09 DIAGNOSIS — E1136 Type 2 diabetes mellitus with diabetic cataract: Secondary | ICD-10-CM | POA: Diagnosis not present

## 2021-09-09 DIAGNOSIS — D649 Anemia, unspecified: Secondary | ICD-10-CM | POA: Diagnosis not present

## 2021-09-09 DIAGNOSIS — Z86711 Personal history of pulmonary embolism: Secondary | ICD-10-CM | POA: Diagnosis not present

## 2021-09-09 DIAGNOSIS — I252 Old myocardial infarction: Secondary | ICD-10-CM | POA: Diagnosis not present

## 2021-09-09 DIAGNOSIS — Z7984 Long term (current) use of oral hypoglycemic drugs: Secondary | ICD-10-CM | POA: Diagnosis not present

## 2021-09-09 DIAGNOSIS — Z9181 History of falling: Secondary | ICD-10-CM | POA: Diagnosis not present

## 2021-09-09 DIAGNOSIS — Z7982 Long term (current) use of aspirin: Secondary | ICD-10-CM | POA: Diagnosis not present

## 2021-09-09 DIAGNOSIS — E43 Unspecified severe protein-calorie malnutrition: Secondary | ICD-10-CM | POA: Diagnosis not present

## 2021-09-09 DIAGNOSIS — K219 Gastro-esophageal reflux disease without esophagitis: Secondary | ICD-10-CM | POA: Diagnosis not present

## 2021-09-10 ENCOUNTER — Telehealth: Payer: Self-pay

## 2021-09-10 DIAGNOSIS — J90 Pleural effusion, not elsewhere classified: Secondary | ICD-10-CM | POA: Diagnosis not present

## 2021-09-10 DIAGNOSIS — Z9981 Dependence on supplemental oxygen: Secondary | ICD-10-CM | POA: Diagnosis not present

## 2021-09-10 DIAGNOSIS — K219 Gastro-esophageal reflux disease without esophagitis: Secondary | ICD-10-CM | POA: Diagnosis not present

## 2021-09-10 DIAGNOSIS — Z794 Long term (current) use of insulin: Secondary | ICD-10-CM | POA: Diagnosis not present

## 2021-09-10 DIAGNOSIS — I4891 Unspecified atrial fibrillation: Secondary | ICD-10-CM | POA: Diagnosis not present

## 2021-09-10 DIAGNOSIS — Z7901 Long term (current) use of anticoagulants: Secondary | ICD-10-CM | POA: Diagnosis not present

## 2021-09-10 DIAGNOSIS — Z86711 Personal history of pulmonary embolism: Secondary | ICD-10-CM | POA: Diagnosis not present

## 2021-09-10 DIAGNOSIS — E43 Unspecified severe protein-calorie malnutrition: Secondary | ICD-10-CM | POA: Diagnosis not present

## 2021-09-10 DIAGNOSIS — I5020 Unspecified systolic (congestive) heart failure: Secondary | ICD-10-CM | POA: Diagnosis not present

## 2021-09-10 DIAGNOSIS — I110 Hypertensive heart disease with heart failure: Secondary | ICD-10-CM | POA: Diagnosis not present

## 2021-09-10 DIAGNOSIS — D649 Anemia, unspecified: Secondary | ICD-10-CM | POA: Diagnosis not present

## 2021-09-10 DIAGNOSIS — Z7982 Long term (current) use of aspirin: Secondary | ICD-10-CM | POA: Diagnosis not present

## 2021-09-10 DIAGNOSIS — E785 Hyperlipidemia, unspecified: Secondary | ICD-10-CM | POA: Diagnosis not present

## 2021-09-10 DIAGNOSIS — G4700 Insomnia, unspecified: Secondary | ICD-10-CM | POA: Diagnosis not present

## 2021-09-10 DIAGNOSIS — E1136 Type 2 diabetes mellitus with diabetic cataract: Secondary | ICD-10-CM | POA: Diagnosis not present

## 2021-09-10 DIAGNOSIS — N179 Acute kidney failure, unspecified: Secondary | ICD-10-CM | POA: Diagnosis not present

## 2021-09-10 DIAGNOSIS — Z993 Dependence on wheelchair: Secondary | ICD-10-CM | POA: Diagnosis not present

## 2021-09-10 DIAGNOSIS — Z9181 History of falling: Secondary | ICD-10-CM | POA: Diagnosis not present

## 2021-09-10 DIAGNOSIS — I252 Old myocardial infarction: Secondary | ICD-10-CM | POA: Diagnosis not present

## 2021-09-10 DIAGNOSIS — M8580 Other specified disorders of bone density and structure, unspecified site: Secondary | ICD-10-CM | POA: Diagnosis not present

## 2021-09-10 DIAGNOSIS — M722 Plantar fascial fibromatosis: Secondary | ICD-10-CM | POA: Diagnosis not present

## 2021-09-10 DIAGNOSIS — I2510 Atherosclerotic heart disease of native coronary artery without angina pectoris: Secondary | ICD-10-CM | POA: Diagnosis not present

## 2021-09-10 DIAGNOSIS — Z931 Gastrostomy status: Secondary | ICD-10-CM | POA: Diagnosis not present

## 2021-09-10 DIAGNOSIS — Z7984 Long term (current) use of oral hypoglycemic drugs: Secondary | ICD-10-CM | POA: Diagnosis not present

## 2021-09-10 NOTE — Telephone Encounter (Signed)
Received phone call from Amy, RN with [facility] regarding patient. Amy reports that there has been some confusion on management of diabetes. Patient has been injecting Lantus, however, has been treating as sliding scale. Over the last week blood sugars have been ranging from 120's to 170's. Patient is only taking once daily, according to sliding scale.   Advised that patient had been discontinued from Humalog and Lantus, per chart review. Recommended that patient discuss diabetes medication management at upcoming appointment on 12/5.   [facility] RN also reports that she noticed a green/yellow drainage from G tube during today's dressing change. Denies pain, redness or fever. Patient is also wanting G tube removed as soon as possible. It appears that GI referred this decision to PCP.  Advised of return precautions and to follow up with this concern at [facility] visit.   ED precautions given. [place] nurse is also asking if provider would like for home health nursing to continue.   Please advise additional recommendations in the meantime.   Talbot Grumbling, RN

## 2021-09-10 NOTE — Telephone Encounter (Signed)
Scheduled patient with Dr. Valentina Lucks on 12/1.  Talbot Grumbling, RN

## 2021-09-11 DIAGNOSIS — Z993 Dependence on wheelchair: Secondary | ICD-10-CM | POA: Diagnosis not present

## 2021-09-11 DIAGNOSIS — M722 Plantar fascial fibromatosis: Secondary | ICD-10-CM | POA: Diagnosis not present

## 2021-09-11 DIAGNOSIS — J90 Pleural effusion, not elsewhere classified: Secondary | ICD-10-CM | POA: Diagnosis not present

## 2021-09-11 DIAGNOSIS — D649 Anemia, unspecified: Secondary | ICD-10-CM | POA: Diagnosis not present

## 2021-09-11 DIAGNOSIS — Z931 Gastrostomy status: Secondary | ICD-10-CM | POA: Diagnosis not present

## 2021-09-11 DIAGNOSIS — K219 Gastro-esophageal reflux disease without esophagitis: Secondary | ICD-10-CM | POA: Diagnosis not present

## 2021-09-11 DIAGNOSIS — I5020 Unspecified systolic (congestive) heart failure: Secondary | ICD-10-CM | POA: Diagnosis not present

## 2021-09-11 DIAGNOSIS — Z7982 Long term (current) use of aspirin: Secondary | ICD-10-CM | POA: Diagnosis not present

## 2021-09-11 DIAGNOSIS — E1136 Type 2 diabetes mellitus with diabetic cataract: Secondary | ICD-10-CM | POA: Diagnosis not present

## 2021-09-11 DIAGNOSIS — N179 Acute kidney failure, unspecified: Secondary | ICD-10-CM | POA: Diagnosis not present

## 2021-09-11 DIAGNOSIS — I110 Hypertensive heart disease with heart failure: Secondary | ICD-10-CM | POA: Diagnosis not present

## 2021-09-11 DIAGNOSIS — G4700 Insomnia, unspecified: Secondary | ICD-10-CM | POA: Diagnosis not present

## 2021-09-11 DIAGNOSIS — I252 Old myocardial infarction: Secondary | ICD-10-CM | POA: Diagnosis not present

## 2021-09-11 DIAGNOSIS — M8580 Other specified disorders of bone density and structure, unspecified site: Secondary | ICD-10-CM | POA: Diagnosis not present

## 2021-09-11 DIAGNOSIS — Z7984 Long term (current) use of oral hypoglycemic drugs: Secondary | ICD-10-CM | POA: Diagnosis not present

## 2021-09-11 DIAGNOSIS — Z9181 History of falling: Secondary | ICD-10-CM | POA: Diagnosis not present

## 2021-09-11 DIAGNOSIS — Z7901 Long term (current) use of anticoagulants: Secondary | ICD-10-CM | POA: Diagnosis not present

## 2021-09-11 DIAGNOSIS — E785 Hyperlipidemia, unspecified: Secondary | ICD-10-CM | POA: Diagnosis not present

## 2021-09-11 DIAGNOSIS — Z794 Long term (current) use of insulin: Secondary | ICD-10-CM | POA: Diagnosis not present

## 2021-09-11 DIAGNOSIS — I2510 Atherosclerotic heart disease of native coronary artery without angina pectoris: Secondary | ICD-10-CM | POA: Diagnosis not present

## 2021-09-11 DIAGNOSIS — Z86711 Personal history of pulmonary embolism: Secondary | ICD-10-CM | POA: Diagnosis not present

## 2021-09-11 DIAGNOSIS — E43 Unspecified severe protein-calorie malnutrition: Secondary | ICD-10-CM | POA: Diagnosis not present

## 2021-09-11 DIAGNOSIS — Z9981 Dependence on supplemental oxygen: Secondary | ICD-10-CM | POA: Diagnosis not present

## 2021-09-11 DIAGNOSIS — I4891 Unspecified atrial fibrillation: Secondary | ICD-10-CM | POA: Diagnosis not present

## 2021-09-12 ENCOUNTER — Encounter: Payer: Self-pay | Admitting: Pharmacist

## 2021-09-12 ENCOUNTER — Other Ambulatory Visit: Payer: Self-pay

## 2021-09-12 ENCOUNTER — Ambulatory Visit (INDEPENDENT_AMBULATORY_CARE_PROVIDER_SITE_OTHER): Payer: Medicare Other | Admitting: Pharmacist

## 2021-09-12 DIAGNOSIS — E1169 Type 2 diabetes mellitus with other specified complication: Secondary | ICD-10-CM

## 2021-09-12 DIAGNOSIS — Z794 Long term (current) use of insulin: Secondary | ICD-10-CM | POA: Diagnosis not present

## 2021-09-12 DIAGNOSIS — E108 Type 1 diabetes mellitus with unspecified complications: Secondary | ICD-10-CM

## 2021-09-12 MED ORDER — ONETOUCH DELICA LANCETS 33G MISC: 1.0000 | 99 refills | Status: DC | PRN

## 2021-09-12 NOTE — Assessment & Plan Note (Signed)
Diabetes longstanding currently at goal given most recent A1c 6.7 (07/24/21). Patient is able to verbalize appropriate hypoglycemia management plan. Medication adherence appears appropriate.  -Continue current medications: Lantus (insulin glargine) 6 units daily and Jardiance (empagliflozin) 10 mg daily

## 2021-09-12 NOTE — Progress Notes (Signed)
Reviewed: I agree with Dr. Koval's documentation and management. 

## 2021-09-12 NOTE — Patient Instructions (Addendum)
It was nice to see you today!  Your goal blood sugar is 80-130 before eating and less than 180 after eating.  Medication Changes:  Continue your current diabetes medications: Lantus 6 units daily and Jardiance 10 mg daily  Monitor blood sugars at home and keep a log (glucometer or piece of paper) to bring with you to your next visit.  Keep up the good work with diet and exercise. Aim for a diet full of vegetables, fruit and lean meats (chicken, [place], fish). Try to limit salt intake by eating fresh or frozen vegetables (instead of canned), rinse canned vegetables prior to cooking and do not add any additional salt to meals.   You see Dr. Caron Presume on Monday, 12/5. We are happy to see you again in January if we need to continue adjusting your insulin. If you have any symptoms of low blood sugars or readings less than 70, please give [place] a call.

## 2021-09-12 NOTE — Progress Notes (Signed)
S:     Chief Complaint  Patient presents with   Medication Management    Medication Review - Diabetes   Patient arrives in good spirits, ambulating with a walker and accompanied by her sister [name]. Presents for diabetes evaluation, education, and management. Patient was referred on 11/29 following a phone call that suggested confusion regarding her medication regimen.  Patient was last seen by Primary Care Provider on 12/18/20.   She reports continuing to get stronger and more healthy. She would like to continue to be more independent and be able to go out of the house on her own. She is hopeful to have g-tube removed soon. Her nurse comes to her house weekly to fill her pill box for her. She brings her medications and glucometer. Denies symptoms of hypoglycemia. She requests a refill of her lancets today.   Family/Social History:  -Tobacco: never smoker  Human resources officer affordability: Medicare + Medicaid  Medication adherence reported.   Current diabetes medications include: Lantus (insulin glargine) 6 units daily and Jardiance (empagliflozin) 10 mg daily Current hypertension medications include: metoprolol tartrate 25 mg BID, spironolactone 12.5 mg daily, Entresto 24-26 mg BID, furosemide 40 mg daily Current hyperlipidemia medications include: rosuvastatin 20 mg daily  Patient denies hypoglycemic events.  Patient reported dietary habits: Eats 3 meals/day Breakfast: cereal, grits, or oatmeal Lunch: egg or tuna salad sandwich Dinner: baked chicken (usually has a protein with dinner) Snacks: Ensure  Patient-reported exercise habits: limited by walker but is walking around house independently, starting to learn how to use cane too   O:  Physical Exam Vitals reviewed.  Constitutional:      Appearance: Normal appearance. She is normal weight.  Pulmonary:     Effort: Pulmonary effort is normal.  Musculoskeletal:        General: No swelling.  Neurological:      Mental Status: She is alert.  Psychiatric:        Mood and Affect: Mood normal.        Behavior: Behavior normal.        Thought Content: Thought content normal.        Judgment: Judgment normal.   Review of Systems  All other systems reviewed and are negative.  Lab Results  Component Value Date   HGBA1C 6.7 (H) 07/24/2021   Vitals:   09/12/21 1432  BP: 124/70  Pulse: (!) 57  SpO2: 99%   Lipid Panel     Component Value Date/Time   CHOL 135 10/28/2019 1558   TRIG 65 06/03/2021 0500   HDL 53 10/28/2019 1558   CHOLHDL 2.5 10/28/2019 1558   CHOLHDL 2.6 12/13/2017 0028   VLDL 15 12/13/2017 0028   LDLCALC 63 10/28/2019 1558   LDLDIRECT 58 07/30/2012 1438   Home fasting blood sugars:  -7 day average 162 (4 readings), 14 day average 155 (7 readings), 30 day average 152 (22 readings) -Readings from the last 30 days: 145, 177, 174, 150, 139, 125, 118, 128, 232, 122, 126, 142, 132, 161, 126, 121, 300, 124, 136, 166, 120, 145  Clinical Atherosclerotic Cardiovascular Disease (ASCVD): Yes  The ASCVD Risk score (Arnett DK, et al., 2019) failed to calculate for the following reasons:   The patient has a prior MI or stroke diagnosis    A/P: Diabetes longstanding currently at goal given most recent A1c 6.7 (07/24/21). Patient is able to verbalize appropriate hypoglycemia management plan. Medication adherence appears appropriate.  -Continue current medications: Lantus (insulin glargine) 6 units daily  and Jardiance (empagliflozin) 10 mg daily -Extensively discussed pathophysiology of diabetes, recommended lifestyle interventions, dietary effects on blood sugar control -Counseled on s/sx of and management of hypoglycemia -Next A1C anticipated January 2023.  -Provided refill of lancets for use with glucometer  Written patient instructions provided.  Total time in face to face counseling 30 minutes.    Follow up PCP Clinic Visit on 12/5. Follow up with pharmacy clinic. Patient seen with  Rebbeca Paul, PharmD - PGY2 Pharmacy Resident.

## 2021-09-13 DIAGNOSIS — Z7901 Long term (current) use of anticoagulants: Secondary | ICD-10-CM | POA: Diagnosis not present

## 2021-09-13 DIAGNOSIS — E43 Unspecified severe protein-calorie malnutrition: Secondary | ICD-10-CM | POA: Diagnosis not present

## 2021-09-13 DIAGNOSIS — I4891 Unspecified atrial fibrillation: Secondary | ICD-10-CM | POA: Diagnosis not present

## 2021-09-13 DIAGNOSIS — K219 Gastro-esophageal reflux disease without esophagitis: Secondary | ICD-10-CM | POA: Diagnosis not present

## 2021-09-13 DIAGNOSIS — Z9981 Dependence on supplemental oxygen: Secondary | ICD-10-CM | POA: Diagnosis not present

## 2021-09-13 DIAGNOSIS — M722 Plantar fascial fibromatosis: Secondary | ICD-10-CM | POA: Diagnosis not present

## 2021-09-13 DIAGNOSIS — Z794 Long term (current) use of insulin: Secondary | ICD-10-CM | POA: Diagnosis not present

## 2021-09-13 DIAGNOSIS — Z7982 Long term (current) use of aspirin: Secondary | ICD-10-CM | POA: Diagnosis not present

## 2021-09-13 DIAGNOSIS — Z931 Gastrostomy status: Secondary | ICD-10-CM | POA: Diagnosis not present

## 2021-09-13 DIAGNOSIS — G4700 Insomnia, unspecified: Secondary | ICD-10-CM | POA: Diagnosis not present

## 2021-09-13 DIAGNOSIS — I110 Hypertensive heart disease with heart failure: Secondary | ICD-10-CM | POA: Diagnosis not present

## 2021-09-13 DIAGNOSIS — I252 Old myocardial infarction: Secondary | ICD-10-CM | POA: Diagnosis not present

## 2021-09-13 DIAGNOSIS — N179 Acute kidney failure, unspecified: Secondary | ICD-10-CM | POA: Diagnosis not present

## 2021-09-13 DIAGNOSIS — E1136 Type 2 diabetes mellitus with diabetic cataract: Secondary | ICD-10-CM | POA: Diagnosis not present

## 2021-09-13 DIAGNOSIS — J90 Pleural effusion, not elsewhere classified: Secondary | ICD-10-CM | POA: Diagnosis not present

## 2021-09-13 DIAGNOSIS — Z993 Dependence on wheelchair: Secondary | ICD-10-CM | POA: Diagnosis not present

## 2021-09-13 DIAGNOSIS — Z9181 History of falling: Secondary | ICD-10-CM | POA: Diagnosis not present

## 2021-09-13 DIAGNOSIS — Z7984 Long term (current) use of oral hypoglycemic drugs: Secondary | ICD-10-CM | POA: Diagnosis not present

## 2021-09-13 DIAGNOSIS — I2510 Atherosclerotic heart disease of native coronary artery without angina pectoris: Secondary | ICD-10-CM | POA: Diagnosis not present

## 2021-09-13 DIAGNOSIS — Z86711 Personal history of pulmonary embolism: Secondary | ICD-10-CM | POA: Diagnosis not present

## 2021-09-13 DIAGNOSIS — M8580 Other specified disorders of bone density and structure, unspecified site: Secondary | ICD-10-CM | POA: Diagnosis not present

## 2021-09-13 DIAGNOSIS — D649 Anemia, unspecified: Secondary | ICD-10-CM | POA: Diagnosis not present

## 2021-09-13 DIAGNOSIS — I5020 Unspecified systolic (congestive) heart failure: Secondary | ICD-10-CM | POA: Diagnosis not present

## 2021-09-13 DIAGNOSIS — E785 Hyperlipidemia, unspecified: Secondary | ICD-10-CM | POA: Diagnosis not present

## 2021-09-16 ENCOUNTER — Other Ambulatory Visit: Payer: Self-pay

## 2021-09-16 ENCOUNTER — Other Ambulatory Visit: Payer: Self-pay | Admitting: Family Medicine

## 2021-09-16 ENCOUNTER — Encounter: Payer: Self-pay | Admitting: Family Medicine

## 2021-09-16 ENCOUNTER — Ambulatory Visit (INDEPENDENT_AMBULATORY_CARE_PROVIDER_SITE_OTHER): Payer: Medicare Other | Admitting: Family Medicine

## 2021-09-16 VITALS — BP 138/76 | HR 96 | Ht 61.0 in | Wt 125.1 lb

## 2021-09-16 DIAGNOSIS — Z931 Gastrostomy status: Secondary | ICD-10-CM

## 2021-09-16 DIAGNOSIS — Z794 Long term (current) use of insulin: Secondary | ICD-10-CM | POA: Diagnosis not present

## 2021-09-16 DIAGNOSIS — E1169 Type 2 diabetes mellitus with other specified complication: Secondary | ICD-10-CM | POA: Diagnosis not present

## 2021-09-16 NOTE — Progress Notes (Signed)
    SUBJECTIVE:   CHIEF COMPLAINT / HPI:   Diabetes checkup Patient was recently seen by pharmacy team regarding her diabetes management.  Most recent hemoglobin A1c was collected on 07/24/2021 and was 6.7.  Currently on Lantus 6 units daily and Jardiance 10 mg daily.  G tube concerns Patient with extensive hospital stay secondary to acute respiratory failure as well as an esophageal tear.  In the hospital stay she spent considerable amount of time in the ICU and was unable to tolerate p.o. foods.  She had G-tube placed.  Since discharge she has been doing well and feels like she wants it removed.  There was consideration of removing it 1 month ago but she had been losing weight so it was decided to leave it in case she needed further nutritional support.  Over the last month she has been doing well.  Patient's weights have been stable and she is eating normally without using the G-tube at all.  She does note that she has noticed more drainage from around the injury site from the G-tube.  She has a nurse that changes her dressing every few days but her daughter also helps with this.  PERTINENT  PMH / PSH: Esophageal tear, diabetes  OBJECTIVE:   BP 138/76   Pulse 96   Ht 5\' 1"  (1.549 m)   Wt 125 lb 2 oz (56.8 kg)   SpO2 100%   BMI 23.64 kg/m   General: Well-appearing 80 year old female, pleasant Cardiac: Regular rate and rhythm Respiratory: Normal work of breathing, lungs clear to auscultation bilaterally Abdomen: G-tube in place, mild amount of erythema noted around entry site, some discharge appreciated MSK: No gross new abnormalities   ASSESSMENT/PLAN:   Type 2 diabetes mellitus with other specified complication ([place]) Diabetes doing well at this time.  A1c 6.7 at previous check on 10/12.  Currently on Lantus 60 units daily and Jardiance 10 mg daily.  Continue this regimen at this time and repeat hemoglobin A1c in January.  Gastrostomy tube in place [facility]) Placed on 06/03/2021 by  interventional radiology.  Patient continues to request removal.  She is doing well and has not used the G-tube in 2 months.  She takes all of her medications and food p.o. without difficulty.  Her weight is stable from 1 month ago at 125 pounds and stable from last week at 125 pounds.  GI was previously consulted to discuss removal on felt that it was our decision to make but did recommend that she is maintaining her weight prior to removal.  Given that she is maintaining her weight and doing so well I have placed an order for interventional radiology to remove G-tube.     Gifford Shave, MD [place]

## 2021-09-16 NOTE — Patient Instructions (Signed)
It was wonderful seeing you today!  I am glad you are doing so well.  Your diabetes seems well controlled and I want you to continue on your current regimen.  Regarding your G-tube I have placed a referral for the people who placed your G-tube and someone should call you to make an appointment to have it removed.  If you have any questions or concerns please call the clinic.  I hope you have a great afternoon and a Merry Christmas!

## 2021-09-17 ENCOUNTER — Telehealth: Payer: Self-pay

## 2021-09-17 NOTE — Assessment & Plan Note (Signed)
Placed on 06/03/2021 by interventional radiology.  Patient continues to request removal.  She is doing well and has not used the G-tube in 2 months.  She takes all of her medications and food p.o. without difficulty.  Her weight is stable from 1 month ago at 125 pounds and stable from last week at 125 pounds.  GI was previously consulted to discuss removal on felt that it was our decision to make but did recommend that she is maintaining her weight prior to removal.  Given that she is maintaining her weight and doing so well I have placed an order for interventional radiology to remove G-tube.

## 2021-09-17 NOTE — Telephone Encounter (Signed)
Received phone call from [place] regarding patient complaining of burning with urination.   Received verbal order from Dr. Andria Frames for UA and culture.   RN is also requesting possible rx for silver nitrate for dressing changes and order for dressings to be changed daily. RN is going to contact office that will be removing g-tube regarding estimated time of removal. If removal is going to be soon, she states she will not need silver nitrate.   RN will call back to office with further clarification on dressing verbal orders.   Talbot Grumbling, RN

## 2021-09-17 NOTE — Assessment & Plan Note (Signed)
Diabetes doing well at this time.  A1c 6.7 at previous check on 10/12.  Currently on Lantus 60 units daily and Jardiance 10 mg daily.  Continue this regimen at this time and repeat hemoglobin A1c in January.

## 2021-09-30 ENCOUNTER — Telehealth: Payer: Self-pay

## 2021-09-30 NOTE — Telephone Encounter (Signed)
Amy with [employer] calls nurse line checking status of interventional radiology referral. Amy reports the patient is very eager to have G Tube removed. Advised Amy referral was placed on 12/5 and processed on 12/6. IR should call and schedule the patient. Amy reports she spoke with IR last week and they had not received anything yet. Amy reports they prefer faxes, however Amy could not provide me with the number they gave her as she has misplaced it.   Will forward to [place].

## 2021-10-01 ENCOUNTER — Other Ambulatory Visit (HOSPITAL_COMMUNITY): Payer: Self-pay | Admitting: Family Medicine

## 2021-10-01 DIAGNOSIS — Z931 Gastrostomy status: Secondary | ICD-10-CM

## 2021-10-01 NOTE — Telephone Encounter (Signed)
LM for Dana Coleman with IR asking her to call Dana Coleman to set up this appointment.  It was placed into their WQ but not sure if it gets managed by anyone.  I left my number for any questions.  Daily Crate,CMA

## 2021-10-08 ENCOUNTER — Ambulatory Visit (HOSPITAL_COMMUNITY)
Admission: RE | Admit: 2021-10-08 | Discharge: 2021-10-08 | Disposition: A | Discharge status: Home / Self Care | Payer: Medicare Other | Source: Ambulatory Visit | Attending: Interventional Radiology | Admitting: Interventional Radiology

## 2021-10-08 ENCOUNTER — Other Ambulatory Visit: Payer: Self-pay

## 2021-10-08 DIAGNOSIS — Z431 Encounter for attention to gastrostomy: Secondary | ICD-10-CM | POA: Insufficient documentation

## 2021-10-08 DIAGNOSIS — Z931 Gastrostomy status: Secondary | ICD-10-CM

## 2021-10-08 HISTORY — PX: IR GASTROSTOMY TUBE REMOVAL: IMG5492

## 2021-10-08 MED ORDER — LIDOCAINE VISCOUS HCL 2 % MT SOLN
OROMUCOSAL | Status: AC
  Filled 2021-10-08: qty 15

## 2021-10-11 ENCOUNTER — Telehealth: Payer: Self-pay

## 2021-10-11 NOTE — Telephone Encounter (Signed)
Amy from [place] calls nurse line requesting to extend home heath nursing 1:5. Verbal orders given per protocol.   Talbot Grumbling, RN

## 2021-10-15 DIAGNOSIS — K219 Gastro-esophageal reflux disease without esophagitis: Secondary | ICD-10-CM | POA: Diagnosis not present

## 2021-10-15 DIAGNOSIS — D649 Anemia, unspecified: Secondary | ICD-10-CM | POA: Diagnosis not present

## 2021-10-15 DIAGNOSIS — I5020 Unspecified systolic (congestive) heart failure: Secondary | ICD-10-CM | POA: Diagnosis not present

## 2021-10-15 DIAGNOSIS — Z7982 Long term (current) use of aspirin: Secondary | ICD-10-CM | POA: Diagnosis not present

## 2021-10-15 DIAGNOSIS — Z7901 Long term (current) use of anticoagulants: Secondary | ICD-10-CM | POA: Diagnosis not present

## 2021-10-15 DIAGNOSIS — M8580 Other specified disorders of bone density and structure, unspecified site: Secondary | ICD-10-CM | POA: Diagnosis not present

## 2021-10-15 DIAGNOSIS — G4700 Insomnia, unspecified: Secondary | ICD-10-CM | POA: Diagnosis not present

## 2021-10-15 DIAGNOSIS — I110 Hypertensive heart disease with heart failure: Secondary | ICD-10-CM | POA: Diagnosis not present

## 2021-10-15 DIAGNOSIS — Z86711 Personal history of pulmonary embolism: Secondary | ICD-10-CM | POA: Diagnosis not present

## 2021-10-15 DIAGNOSIS — E43 Unspecified severe protein-calorie malnutrition: Secondary | ICD-10-CM | POA: Diagnosis not present

## 2021-10-15 DIAGNOSIS — Z931 Gastrostomy status: Secondary | ICD-10-CM | POA: Diagnosis not present

## 2021-10-15 DIAGNOSIS — E1136 Type 2 diabetes mellitus with diabetic cataract: Secondary | ICD-10-CM | POA: Diagnosis not present

## 2021-10-15 DIAGNOSIS — Z794 Long term (current) use of insulin: Secondary | ICD-10-CM | POA: Diagnosis not present

## 2021-10-15 DIAGNOSIS — M722 Plantar fascial fibromatosis: Secondary | ICD-10-CM | POA: Diagnosis not present

## 2021-10-15 DIAGNOSIS — I2510 Atherosclerotic heart disease of native coronary artery without angina pectoris: Secondary | ICD-10-CM | POA: Diagnosis not present

## 2021-10-15 DIAGNOSIS — Z9181 History of falling: Secondary | ICD-10-CM | POA: Diagnosis not present

## 2021-10-15 DIAGNOSIS — E785 Hyperlipidemia, unspecified: Secondary | ICD-10-CM | POA: Diagnosis not present

## 2021-10-15 DIAGNOSIS — I4891 Unspecified atrial fibrillation: Secondary | ICD-10-CM | POA: Diagnosis not present

## 2021-10-15 DIAGNOSIS — Z7984 Long term (current) use of oral hypoglycemic drugs: Secondary | ICD-10-CM | POA: Diagnosis not present

## 2021-10-16 DIAGNOSIS — G4700 Insomnia, unspecified: Secondary | ICD-10-CM | POA: Diagnosis not present

## 2021-10-16 DIAGNOSIS — I5020 Unspecified systolic (congestive) heart failure: Secondary | ICD-10-CM | POA: Diagnosis not present

## 2021-10-16 DIAGNOSIS — I4891 Unspecified atrial fibrillation: Secondary | ICD-10-CM | POA: Diagnosis not present

## 2021-10-16 DIAGNOSIS — E1136 Type 2 diabetes mellitus with diabetic cataract: Secondary | ICD-10-CM | POA: Diagnosis not present

## 2021-10-16 DIAGNOSIS — Z931 Gastrostomy status: Secondary | ICD-10-CM | POA: Diagnosis not present

## 2021-10-16 DIAGNOSIS — Z7982 Long term (current) use of aspirin: Secondary | ICD-10-CM | POA: Diagnosis not present

## 2021-10-16 DIAGNOSIS — Z86711 Personal history of pulmonary embolism: Secondary | ICD-10-CM | POA: Diagnosis not present

## 2021-10-16 DIAGNOSIS — Z7984 Long term (current) use of oral hypoglycemic drugs: Secondary | ICD-10-CM | POA: Diagnosis not present

## 2021-10-16 DIAGNOSIS — E785 Hyperlipidemia, unspecified: Secondary | ICD-10-CM | POA: Diagnosis not present

## 2021-10-16 DIAGNOSIS — Z7901 Long term (current) use of anticoagulants: Secondary | ICD-10-CM | POA: Diagnosis not present

## 2021-10-16 DIAGNOSIS — I2510 Atherosclerotic heart disease of native coronary artery without angina pectoris: Secondary | ICD-10-CM | POA: Diagnosis not present

## 2021-10-16 DIAGNOSIS — Z9181 History of falling: Secondary | ICD-10-CM | POA: Diagnosis not present

## 2021-10-16 DIAGNOSIS — I110 Hypertensive heart disease with heart failure: Secondary | ICD-10-CM | POA: Diagnosis not present

## 2021-10-16 DIAGNOSIS — M8580 Other specified disorders of bone density and structure, unspecified site: Secondary | ICD-10-CM | POA: Diagnosis not present

## 2021-10-16 DIAGNOSIS — E43 Unspecified severe protein-calorie malnutrition: Secondary | ICD-10-CM | POA: Diagnosis not present

## 2021-10-16 DIAGNOSIS — D649 Anemia, unspecified: Secondary | ICD-10-CM | POA: Diagnosis not present

## 2021-10-16 DIAGNOSIS — M722 Plantar fascial fibromatosis: Secondary | ICD-10-CM | POA: Diagnosis not present

## 2021-10-16 DIAGNOSIS — Z794 Long term (current) use of insulin: Secondary | ICD-10-CM | POA: Diagnosis not present

## 2021-10-16 DIAGNOSIS — K219 Gastro-esophageal reflux disease without esophagitis: Secondary | ICD-10-CM | POA: Diagnosis not present

## 2021-10-17 ENCOUNTER — Ambulatory Visit (INDEPENDENT_AMBULATORY_CARE_PROVIDER_SITE_OTHER): Payer: Medicare Other

## 2021-10-17 VITALS — Ht 62.0 in | Wt 120.0 lb

## 2021-10-17 DIAGNOSIS — Z0000 Encounter for general adult medical examination without abnormal findings: Secondary | ICD-10-CM

## 2021-10-17 NOTE — Progress Notes (Signed)
Subjective:   Dana Coleman is a 81 y.o. female who presents for Medicare Annual (Subsequent) preventive examination.  Patient consented to have virtual visit and was identified by name and date of birth. Method of visit: Telephone  Encounter participants: Patient: [place] [facility] - located at Home Nurse/Provider: Dorna [facility] - located at [place] Others (if applicable): NA  Review of Systems: Defer to PCP.   Cardiac Risk Factors include: advanced age (>45men, >27 women);diabetes mellitus;hypertension;family history of premature cardiovascular disease  Objective:   Vitals: Ht 5\' 2"  (1.575 m)    Wt 120 lb (54.4 kg)    BMI 21.95 kg/m   Body mass index is 21.95 kg/m.  Advanced Directives 10/17/2021 08/20/2021 07/22/2021 05/05/2021 05/02/2021 04/26/2021 03/29/2021  Does Patient Have a Medical Advance Directive? No No No No No No No  Would patient like information on creating a medical advance directive? Yes (MAU/Ambulatory/Procedural Areas - Information given) No - Patient declined No - Patient declined No - Patient declined No - Patient declined - -   Tobacco Social History   Tobacco Use  Smoking Status Never  Smokeless Tobacco Never  Tobacco Comments   No plans to start     Clinical Intake:  Pre-visit preparation completed: Yes  How often do you need to have someone help you when you read instructions, pamphlets, [facility] other written materials from your doctor [facility] pharmacy?: 2 - Rarely What is the last grade level you completed in school?: 10th grade  Past Medical History:  Diagnosis Date   Allergy    occ uses OTC allergy meds    Arthritis    fingers   Breast cancer ([place]) 04/2008   Right breast   Cataracts, bilateral    removed bilat    Cervical cancer ([place])    When the patient was in her 69s   Depression    Diabetes mellitus without complication ([place])    Heart attack ([place]) 2004   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 Truddie Coco   PEG (percutaneous endoscopic gastrostomy) status ([place]) 07/23/2021   Personal history of radiation therapy 2009   Respiratory failure ([place]) 05/04/2021   Past Surgical History:  Procedure Laterality Date   BREAST LUMPECTOMY Right 2009   BREAST SURGERY  2009   right lumpectomy   [place] N/A 05/02/2021   Procedure: LAPAROSCOPIC CHOLECYSTECTOMY;  Surgeon: Greer Pickerel, MD;  Location: [place] [facility];  Service: General;  Laterality: N/A;   COLONOSCOPY     IR GASTROSTOMY TUBE MOD SED  06/03/2021   IR GASTROSTOMY TUBE REMOVAL  10/08/2021   LEFT HEART CATHETERIZATION WITH CORONARY ANGIOGRAM N/A 04/20/2014   Procedure: LEFT HEART CATHETERIZATION WITH CORONARY ANGIOGRAM;  Surgeon: Jacolyn Reedy, MD;  Location: [facility];  Service: Cardiovascular;  Laterality: N/A;   POLYPECTOMY     STENT PLACEMENT VASCULAR ([place] [facility] HX)     THORACOTOMY Right 05/05/2021   Procedure: THORACOTOMY MAJOR REPAIR PERFORATED ESOPHAGUS;  Surgeon: Gaye Pollack, MD;  Location: [place] [facility];  Service: Thoracic;  Laterality: Right;   thyroid     for nodule hemithyroidectomy, benign   THYROIDECTOMY Right 03/2003   Family History  Problem Relation Age of Onset   Heart disease Mother        age 53   Heart disease Father    Cancer Father  unknown   Heart disease Brother    Hypertension Brother    Cirrhosis Brother    Diabetes Daughter    Stroke Daughter    Kidney disease Daughter    Hypertension Son    Colon cancer Neg Hx    Colon polyps Neg Hx    Esophageal cancer Neg Hx    Stomach cancer Neg Hx    Rectal cancer Neg Hx    Social History   Socioeconomic History   Marital status: Divorced    Spouse name: Not on file   Number of children: 5   Years of education: 10   Highest education level: 10th grade  Occupational History   Occupation: Retired- IT trainer  Tobacco Use   Smoking status: Never   Smokeless tobacco: Never   Tobacco  comments:    No plans to start  Vaping Use   Vaping Use: Never used  Substance and Sexual Activity   Alcohol use: No   Drug use: No   Sexual activity: Not Currently    Birth control/protection: Condom  Other Topics Concern   Not on file  Social History Narrative   Patient lives alone in [place].   Patient is close with daughters [place] and [place]. One Son in [place]. 2 children have passed.    Patient depends on her sister and brother for support and any assistance needed.    Dana [facility] (sister) [phone].   Religious / Personal Beliefs: Baptist   Patient does not drive.   Diet: Pt has a varied diet of protein, starch, and vegetables and fruits.   Seatbelts: Pt reports wearing seatbelt when in vehicles.    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                                                              Social Determinants of Radio broadcast assistant Strain: Low Risk    Difficulty of Paying Living Expenses: Not hard at all  Food Insecurity: No Food Insecurity   Worried About Charity fundraiser in the Last Year: Never true   Arboriculturist in the Last Year: Never true  Transportation Needs: No Data processing manager (Medical): No   Lack of Transportation (Non-Medical): No  Physical Activity: Insufficiently Active   Days of Exercise per Week: 1 day   Minutes of Exercise per Session: 40 min  Stress: No Stress Concern Present   Feeling of Stress : Not at all  Social Connections: Moderately Isolated   Frequency of Communication with Friends and Family: More than three times a week   Frequency of Social Gatherings with Friends and Family: More than three times a week   Attends Religious Services: More than 4 times per year   Active Member of [employer] [place] Organizations: No   Attends Archivist Meetings: Never   Marital Status: Divorced   Outpatient Encounter Medications as of  10/17/2021  Medication Sig   amiodarone (PACERONE) 100 MG tablet Take 1 tablet (100 mg total) by mouth daily.   aspirin EC 81 MG tablet Take 81 mg by mouth in the morning. Swallow whole.   Blood Glucose Monitoring Suppl MISC One touch ultra glucose  monitor. Check blood sugar twice a day.   ENTRESTO 24-26 MG Take 1 tablet by mouth 2 (two) times daily.   furosemide (LASIX) 40 MG tablet Take 1 tablet (40 mg total) by mouth daily.   glucose blood (ONE TOUCH ULTRA TEST) test strip CHECK BLOOD SUGAR TWICE DAILY   insulin glargine (LANTUS SOLOSTAR) 100 UNIT/ML Solostar Pen Inject 6 Units into the skin daily.   Insulin Pen Needle (B-D UF III MINI PEN NEEDLES) 31G X 5 MM MISC INJECT INSULIN VIA PEN 6 TIMES DAILY   JARDIANCE 10 MG TABS tablet TAKE 1 TABLET BY MOUTH EVERY DAY (Patient taking differently: Take 10 mg by mouth daily.)   Melatonin 3 MG TBDP Take 6 mg by mouth at bedtime.   metoprolol tartrate (LOPRESSOR) 25 MG tablet Take 1 tablet (25 mg total) by mouth 2 (two) times daily.   nitroGLYCERIN (NITROSTAT) 0.4 MG SL tablet Place 1 tablet (0.4 mg total) under the tongue every 5 (five) minutes as needed for chest pain.   omeprazole (PRILOSEC) 20 MG capsule Take 40 mg by mouth every morning.   ondansetron (ZOFRAN) 4 MG tablet Take 1 tablet (4 mg total) by mouth daily as needed for nausea Coleman vomiting.   OneTouch Delica Lancets 18A MISC 1 Container by Does not apply route as needed.   rosuvastatin (CRESTOR) 20 MG tablet TAKE 1 TABLET BY MOUTH EVERYDAY AT BEDTIME (Patient taking differently: Take 20 mg by mouth daily.)   spironolactone (ALDACTONE) 25 MG tablet TAKE 1/2 TABLET BY MOUTH EVERY DAY   traZODone (DESYREL) 50 MG tablet Take 50 mg by mouth as needed for sleep.   apixaban (ELIQUIS) 5 MG TABS tablet Take 1 tablet (5 mg total) by mouth 2 (two) times daily.   No facility-administered encounter medications on file as of 10/17/2021.   Activities of Daily Living In your present state of health, do you  have any difficulty performing the following activities: 10/17/2021 05/05/2021  Hearing? N N  Vision? N N  Difficulty concentrating Coleman making decisions? N N  Walking Coleman climbing stairs? Y Y  Dressing Coleman bathing? N N  Doing errands, shopping? N N  Preparing Food and eating ? N -  Using the Toilet? N -  In the past six months, have you accidently leaked urine? N -  Do you have problems with loss of bowel control? N -  Managing your Medications? N -  Managing your Finances? N -  Housekeeping Coleman managing your Housekeeping? N -  Some recent data might be hidden   Patient Care Team: Gifford Shave, MD as PCP - General Luberta Mutter, MD (Ophthalmology) Jacolyn Reedy, MD (Cardiology)    Assessment:   This is a routine wellness examination for [place].  Exercise Activities and Dietary recommendations Current Exercise Habits: Home exercise routine, Type of exercise: Other - see comments (Physical Therapy), Time (Minutes): 45, Frequency (Times/Week): 1, Weekly Exercise (Minutes/Week): 45, Exercise limited by: cardiac condition(s)   Goals      HEMOGLOBIN A1C < 7     07/24/2021-6.7     Increase water intake     Infuse with fruit Coleman vegetable     Weight < 135 lb (61.236 kg)     Goal Weigh of 135 lbs        Fall Risk Fall Risk  10/17/2021 03/25/2021 12/18/2020 08/14/2020 05/15/2020  Falls in the past year? 0 0 0 0 0  Number falls in past yr: - 0 0 0 0  Injury  with Fall? - 0 0 - 0  Risk for fall due to : Impaired balance/gait - - - -  Follow up Falls prevention discussed - - Falls evaluation completed Falls evaluation completed   Patient reports abnormal gait and balance. Patient uses a cane and/Coleman walker to ambulate.   Is the patient's home free of loose throw rugs in walkways, pet beds, electrical cords, etc?   yes      Grab bars in the bathroom? yes      Handrails on the stairs?   yes      Adequate lighting?   yes  Patient rating of health (0-10) scale: 7   Depression  Screen PHQ 2/9 Scores 10/17/2021 09/16/2021 08/20/2021 03/25/2021  PHQ - 2 Score 0 0 0 0  PHQ- 9 Score 0 0 0 0    Cognitive Function MMSE - Mini Mental State Exam 07/15/2018 01/03/2014  Orientation to time 5 5  Orientation to Place 5 5  Registration 3 3  Attention/ Calculation 5 4  Recall 3 3  Language- name 2 objects 2 2  Language- repeat 1 1  Language- follow 3 step command 3 3  Language- read & follow direction 1 1  Write a sentence 1 1  Copy design 1 1  Total score 30 29   6CIT Screen 10/17/2021 07/15/2018  What Year? 0 points 0 points  What month? 0 points 0 points  What time? 0 points 0 points  Count back from 20 0 points 0 points  Months in reverse 0 points 0 points  Repeat phrase 0 points 0 points  Total Score 0 0   Immunization History  Administered Date(s) Administered   Fluad Quad(high Dose 65+) 08/08/2021   Influenza Split 06/30/2011   Influenza Whole 07/24/2008, 08/24/2009, 08/15/2010   Influenza,inj,Quad PF,6+ Mos 06/29/2013, 08/14/2014, 11/07/2014, 08/09/2015, 08/05/2016, 07/14/2017, 06/07/2019   Influenza-Unspecified 07/28/2020   PFIZER Comirnaty(Gray Top)Covid-19 Tri-Sucrose Vaccine 03/25/2021   PFIZER(Purple Top)SARS-COV-2 Vaccination 11/03/2019, 11/24/2019, 07/28/2020   Pneumococcal Conjugate-13 11/16/2015   Pneumococcal Polysaccharide-23 11/14/1999, 08/15/2010, 06/30/2011   Td 03/13/2006   Tdap 11/17/2017   Screening Tests Health Maintenance  Topic Date Due   Zoster Vaccines- Shingrix (1 of 2) Never done   FOOT EXAM  06/02/2020   COVID-19 Vaccine (5 - Booster for Pfizer series) 05/20/2021   HEMOGLOBIN A1C  01/22/2022   OPHTHALMOLOGY EXAM  04/23/2022   COLONOSCOPY (Pts 45-32yrs Insurance coverage will need to be confirmed)  12/05/2022   TETANUS/TDAP  11/18/2027   Pneumonia Vaccine 98+ Years old  Completed   INFLUENZA VACCINE  Completed   DEXA SCAN  Completed   Hepatitis C Screening  Completed   HPV VACCINES  Aged Out   Qualifies for Shingles  Vaccine? Yes   Shingrix Completed: No, Education has been provided regarding the importance of this vaccine. Advised may receive this vaccine at local pharmacy [facility] Aware to provide a copy of the vaccination record if obtained from local pharmacy [facility]. Verbalized acceptance and understanding.  Cancer Screenings: Lung: Low Dose CT Chest recommended if Age 76-80 years, 30 pack-year currently smoking Coleman have quit w/in 15years. Patient does not qualify. Breast:  Up to date on Mammogram? Yes (05/2020) needs FU this year.  Up to date of Bone Density/Dexa? Yes Colorectal: UTD- due 2024  Additional Screenings Hepatitis C Screening: Completed  HIV Screening: Completed   Plan:  PCP apt scheduled for 10/24/2021. Consider Shingles Vaccines.  Bivalent Booster at PCP apt.  Fill out advanced  directive.   I have personally reviewed and noted the following in the patients chart:   Medical and social history Use of alcohol, tobacco Coleman illicit drugs  Current medications and supplements Functional ability and status Nutritional status Physical activity Advanced directives List of other physicians Hospitalizations, surgeries, and ER visits in previous 12 months Vitals Screenings to include cognitive, depression, and falls Referrals and appointments  In addition, I have reviewed and discussed with patient certain preventive protocols, quality metrics, and best practice recommendations. A written personalized care plan for preventive services as well as general preventive health recommendations were provided to patient.  [place], [facility]  10/22/2021

## 2021-10-21 DIAGNOSIS — E43 Unspecified severe protein-calorie malnutrition: Secondary | ICD-10-CM | POA: Diagnosis not present

## 2021-10-21 DIAGNOSIS — I2510 Atherosclerotic heart disease of native coronary artery without angina pectoris: Secondary | ICD-10-CM | POA: Diagnosis not present

## 2021-10-21 DIAGNOSIS — Z7984 Long term (current) use of oral hypoglycemic drugs: Secondary | ICD-10-CM | POA: Diagnosis not present

## 2021-10-21 DIAGNOSIS — K219 Gastro-esophageal reflux disease without esophagitis: Secondary | ICD-10-CM | POA: Diagnosis not present

## 2021-10-21 DIAGNOSIS — Z7982 Long term (current) use of aspirin: Secondary | ICD-10-CM | POA: Diagnosis not present

## 2021-10-21 DIAGNOSIS — Z931 Gastrostomy status: Secondary | ICD-10-CM | POA: Diagnosis not present

## 2021-10-21 DIAGNOSIS — Z7901 Long term (current) use of anticoagulants: Secondary | ICD-10-CM | POA: Diagnosis not present

## 2021-10-21 DIAGNOSIS — E785 Hyperlipidemia, unspecified: Secondary | ICD-10-CM | POA: Diagnosis not present

## 2021-10-21 DIAGNOSIS — I4891 Unspecified atrial fibrillation: Secondary | ICD-10-CM | POA: Diagnosis not present

## 2021-10-21 DIAGNOSIS — E1136 Type 2 diabetes mellitus with diabetic cataract: Secondary | ICD-10-CM | POA: Diagnosis not present

## 2021-10-21 DIAGNOSIS — M722 Plantar fascial fibromatosis: Secondary | ICD-10-CM | POA: Diagnosis not present

## 2021-10-21 DIAGNOSIS — G4700 Insomnia, unspecified: Secondary | ICD-10-CM | POA: Diagnosis not present

## 2021-10-21 DIAGNOSIS — M8580 Other specified disorders of bone density and structure, unspecified site: Secondary | ICD-10-CM | POA: Diagnosis not present

## 2021-10-21 DIAGNOSIS — Z86711 Personal history of pulmonary embolism: Secondary | ICD-10-CM | POA: Diagnosis not present

## 2021-10-21 DIAGNOSIS — D649 Anemia, unspecified: Secondary | ICD-10-CM | POA: Diagnosis not present

## 2021-10-21 DIAGNOSIS — I110 Hypertensive heart disease with heart failure: Secondary | ICD-10-CM | POA: Diagnosis not present

## 2021-10-21 DIAGNOSIS — I5020 Unspecified systolic (congestive) heart failure: Secondary | ICD-10-CM | POA: Diagnosis not present

## 2021-10-21 DIAGNOSIS — Z794 Long term (current) use of insulin: Secondary | ICD-10-CM | POA: Diagnosis not present

## 2021-10-21 DIAGNOSIS — Z9181 History of falling: Secondary | ICD-10-CM | POA: Diagnosis not present

## 2021-10-22 DIAGNOSIS — Z794 Long term (current) use of insulin: Secondary | ICD-10-CM | POA: Diagnosis not present

## 2021-10-22 DIAGNOSIS — Z7982 Long term (current) use of aspirin: Secondary | ICD-10-CM | POA: Diagnosis not present

## 2021-10-22 DIAGNOSIS — M8580 Other specified disorders of bone density and structure, unspecified site: Secondary | ICD-10-CM | POA: Diagnosis not present

## 2021-10-22 DIAGNOSIS — Z9181 History of falling: Secondary | ICD-10-CM | POA: Diagnosis not present

## 2021-10-22 DIAGNOSIS — M722 Plantar fascial fibromatosis: Secondary | ICD-10-CM | POA: Diagnosis not present

## 2021-10-22 DIAGNOSIS — I110 Hypertensive heart disease with heart failure: Secondary | ICD-10-CM | POA: Diagnosis not present

## 2021-10-22 DIAGNOSIS — I4891 Unspecified atrial fibrillation: Secondary | ICD-10-CM | POA: Diagnosis not present

## 2021-10-22 DIAGNOSIS — K219 Gastro-esophageal reflux disease without esophagitis: Secondary | ICD-10-CM | POA: Diagnosis not present

## 2021-10-22 DIAGNOSIS — G4700 Insomnia, unspecified: Secondary | ICD-10-CM | POA: Diagnosis not present

## 2021-10-22 DIAGNOSIS — E43 Unspecified severe protein-calorie malnutrition: Secondary | ICD-10-CM | POA: Diagnosis not present

## 2021-10-22 DIAGNOSIS — Z86711 Personal history of pulmonary embolism: Secondary | ICD-10-CM | POA: Diagnosis not present

## 2021-10-22 DIAGNOSIS — Z931 Gastrostomy status: Secondary | ICD-10-CM | POA: Diagnosis not present

## 2021-10-22 DIAGNOSIS — Z7901 Long term (current) use of anticoagulants: Secondary | ICD-10-CM | POA: Diagnosis not present

## 2021-10-22 DIAGNOSIS — D649 Anemia, unspecified: Secondary | ICD-10-CM | POA: Diagnosis not present

## 2021-10-22 DIAGNOSIS — Z7984 Long term (current) use of oral hypoglycemic drugs: Secondary | ICD-10-CM | POA: Diagnosis not present

## 2021-10-22 DIAGNOSIS — E785 Hyperlipidemia, unspecified: Secondary | ICD-10-CM | POA: Diagnosis not present

## 2021-10-22 DIAGNOSIS — I2510 Atherosclerotic heart disease of native coronary artery without angina pectoris: Secondary | ICD-10-CM | POA: Diagnosis not present

## 2021-10-22 DIAGNOSIS — E1136 Type 2 diabetes mellitus with diabetic cataract: Secondary | ICD-10-CM | POA: Diagnosis not present

## 2021-10-22 DIAGNOSIS — I5020 Unspecified systolic (congestive) heart failure: Secondary | ICD-10-CM | POA: Diagnosis not present

## 2021-10-22 NOTE — Progress Notes (Signed)
I have reviewed this visit and agree with the documentation.   

## 2021-10-22 NOTE — Patient Instructions (Signed)
You spoke to [place], [place] over the phone for your annual wellness visit.  We discussed goals:   Goals      HEMOGLOBIN A1C < 7     07/24/2021-6.7     Increase water intake     Infuse with fruit or vegetable     Weight < 135 lb (61.236 kg)     Goal Weigh of 135 lbs        We also discussed recommended health maintenance. As discussed, you are due for:  Health Maintenance  Topic Date Due   Zoster Vaccines- Shingrix (1 of 2) Never done   FOOT EXAM  06/02/2020   COVID-19 Vaccine (5 - Booster for Pfizer series) 05/20/2021   HEMOGLOBIN A1C  01/22/2022   OPHTHALMOLOGY EXAM  04/23/2022   COLONOSCOPY (Pts 45-39yr Insurance coverage will need to be confirmed)  12/05/2022   TETANUS/TDAP  11/18/2027   Pneumonia Vaccine 80 Years old  Completed   INFLUENZA VACCINE  Completed   DEXA SCAN  Completed   Hepatitis C Screening  Completed   HPV VACCINES  Aged Out   PCP apt scheduled for 10/24/2021. Consider Shingles Vaccines.  Bivalent Booster at PCP apt.  Fill out advanced directive.   Preventive Care 656Years and Older, Female Preventive care refers to lifestyle choices and visits with your health care provider that can promote health and wellness. Preventive care visits are also called wellness exams. What can I expect for my preventive care visit? Counseling Your health care provider may ask you questions about your: Medical history, including: Past medical problems. Family medical history. Pregnancy and menstrual history. History of falls. Current health, including: Memory and ability to understand (cognition). Emotional well-being. Home life and relationship well-being. Sexual activity and sexual health. Lifestyle, including: Alcohol, nicotine or tobacco, and drug use. Access to firearms. Diet, exercise, and sleep habits. Work and work eStatistician Sunscreen use. Safety issues such as seatbelt and bike helmet use. Physical exam Your health care provider will  check your: Height and weight. These may be used to calculate your BMI (body mass index). BMI is a measurement that tells if you are at a healthy weight. Waist circumference. This measures the distance around your waistline. This measurement also tells if you are at a healthy weight and may help predict your risk of certain diseases, such as type 2 diabetes and high blood pressure. Heart rate and blood pressure. Body temperature. Skin for abnormal spots. What immunizations do I need? Vaccines are usually given at various ages, according to a schedule. Your health care provider will recommend vaccines for you based on your age, medical history, and lifestyle or other factors, such as travel or where you work. What tests do I need? Screening Your health care provider may recommend screening tests for certain conditions. This may include: Lipid and cholesterol levels. Hepatitis C test. Hepatitis B test. HIV (human immunodeficiency virus) test. STI (sexually transmitted infection) testing, if you are at risk. Lung cancer screening. Colorectal cancer screening. Diabetes screening. This is done by checking your blood sugar (glucose) after you have not eaten for a while (fasting). Mammogram. Talk with your health care provider about how often you should have regular mammograms. BRCA-related cancer screening. This may be done if you have a family history of breast, ovarian, tubal, or peritoneal cancers. Bone density scan. This is done to screen for osteoporosis. Talk with your health care provider about your test results, treatment options, and if necessary, the need for more  tests. Follow these instructions at home: Eating and drinking  Eat a diet that includes fresh fruits and vegetables, whole grains, lean protein, and low-fat dairy products. Limit your intake of foods with high amounts of sugar, saturated fats, and salt. Take vitamin and mineral supplements as recommended by your health care  provider. Do not drink alcohol if your health care provider tells you not to drink. If you drink alcohol: Limit how much you have to 0-1 drink a day. Know how much alcohol is in your drink. In the [place] one drink equals one 12 oz bottle of beer (355 mL), one 5 oz glass of wine (148 mL), or one 1 oz glass of hard liquor (44 mL). Lifestyle Brush your teeth every morning and night with fluoride toothpaste. Floss one time each day. Exercise for at least 30 minutes 5 or more days each week. Do not use any products that contain nicotine or tobacco. These products include cigarettes, chewing tobacco, and vaping devices, such as e-cigarettes. If you need help quitting, ask your health care provider. Do not use drugs. If you are sexually active, practice safe sex. Use a condom or other form of protection in order to prevent STIs. Take aspirin only as told by your health care provider. Make sure that you understand how much to take and what form to take. Work with your health care provider to find out whether it is safe and beneficial for you to take aspirin daily. Ask your health care provider if you need to take a cholesterol-lowering medicine (statin). Find healthy ways to manage stress, such as: Meditation, yoga, or listening to music. Journaling. Talking to a trusted person. Spending time with friends and family. Minimize exposure to UV radiation to reduce your risk of skin cancer. Safety Always wear your seat belt while driving or riding in a vehicle. Do not drive: If you have been drinking alcohol. Do not ride with someone who has been drinking. When you are tired or distracted. While texting. If you have been using any mind-altering substances or drugs. Wear a helmet and other protective equipment during sports activities. If you have firearms in your house, make sure you follow all gun safety procedures. What's next? Visit your health care provider once a year for an annual wellness  visit. Ask your health care provider how often you should have your eyes and teeth checked. Stay up to date on all vaccines. This information is not intended to replace advice given to you by your health care provider. Make sure you discuss any questions you have with your health care provider. Document Revised: 03/27/2021 Document Reviewed: 03/27/2021 Elsevier Patient Education  2022 [facility] number is [phone]. Please call with questions or concerns about what we discussed today.

## 2021-10-24 ENCOUNTER — Other Ambulatory Visit: Payer: Self-pay

## 2021-10-24 ENCOUNTER — Encounter: Payer: Self-pay | Admitting: Family Medicine

## 2021-10-24 ENCOUNTER — Ambulatory Visit (INDEPENDENT_AMBULATORY_CARE_PROVIDER_SITE_OTHER): Payer: Commercial Managed Care - HMO | Admitting: Family Medicine

## 2021-10-24 VITALS — BP 149/78 | HR 56 | Ht 62.0 in | Wt 118.8 lb

## 2021-10-24 DIAGNOSIS — R300 Dysuria: Secondary | ICD-10-CM

## 2021-10-24 DIAGNOSIS — Z1231 Encounter for screening mammogram for malignant neoplasm of breast: Secondary | ICD-10-CM

## 2021-10-24 DIAGNOSIS — E1169 Type 2 diabetes mellitus with other specified complication: Secondary | ICD-10-CM

## 2021-10-24 DIAGNOSIS — N898 Other specified noninflammatory disorders of vagina: Secondary | ICD-10-CM

## 2021-10-24 DIAGNOSIS — Z794 Long term (current) use of insulin: Secondary | ICD-10-CM

## 2021-10-24 LAB — POCT URINALYSIS DIP (MANUAL ENTRY)
Bilirubin, UA: NEGATIVE
Blood, UA: NEGATIVE
Glucose, UA: >=1000 mg/dL — AB
Ketones, POC UA: NEGATIVE mg/dL
Leukocytes, UA: NEGATIVE
Nitrite, UA: NEGATIVE
Protein Ur, POC: NEGATIVE mg/dL
Spec Grav, UA: 1.01 (ref 1.010–1.025)
Urobilinogen, UA: 0.2 E.U./dL
pH, UA: 5.5 (ref 5.0–8.0)

## 2021-10-24 LAB — POCT WET PREP (WET MOUNT)
Clue Cells Wet Prep Whiff POC: NEGATIVE
Trichomonas Wet Prep HPF POC: ABSENT

## 2021-10-24 LAB — POCT GLYCOSYLATED HEMOGLOBIN (HGB A1C): HbA1c, POC (controlled diabetic range): 7.8 % — AB (ref 0.0–7.0)

## 2021-10-24 MED ORDER — GLUCERNA SHAKE PO LIQD: 237.0000 mL | Freq: Three times a day (TID) | ORAL | 3 refills | Status: AC

## 2021-10-24 NOTE — Patient Instructions (Addendum)
It was wonderful seeing you today!  I have sent a prescription for Glucerna and to your pharmacy.  You can stop using the Ensure and please do the Glucerna 3 times a day.  I would like for you to follow-up in 1 month for a weight check.  We are going to continue on your current diabetes regimen.  I have ordered a mammogram and you will need to call to schedule that.  Below are the instructions to do this.  I recommend you undergo a mammogram.   You can call to schedule an appointment by calling [phone].   Directions [place], [place] [address]  Please let me know if you have questions. I will send you a letter or call you with results.

## 2021-10-24 NOTE — Progress Notes (Signed)
° ° °  SUBJECTIVE:   CHIEF COMPLAINT / HPI:   Diabetes Check up  Patient believes that her diabetes is doing good.  She reports compliance with her Jardiance as well as Lantus 6 units daily.  Denies any episodes of hypoglycemia.  Reports no changes in her diet.  Does acknowledge urinary frequency and burning with urination.  Burning with urination Patient reports several day history of burning with urination as well as urinary frequency.  Denies any blood in her urine.  Feels like she may have a UTI.  Reports that it burns on the outside as well after urination.   OBJECTIVE:   BP (!) 149/78    Pulse (!) 56    Ht 5\' 2"  (1.575 m)    Wt 118 lb 12.8 oz (53.9 kg)    SpO2 100%    BMI 21.73 kg/m   Pleasant General: Pleasant 81 year old female in no acute distress Cardiac: Regular rate and rhythm Respiratory: On her breathing, lungs clear to auscultation Abdomen: Soft, nontender, bandage over site where G-tube was placed.  Wound shows scant serosanguineous discharge with no signs of infection such as erythema or warmth around the incision.  Bandage replaced GU: Dry vaginal tissue, no abnormal vaginal discharge MSK: Ambulates with walker  ASSESSMENT/PLAN:   Type 2 diabetes mellitus with other specified complication (HCC) Currently doing well on Lantus 6 units daily and Jardiance 10 mg daily.  Hemoglobin A1c today of 7.8.  Discussed continuation of Lantus and Jardiance at prescribed dose.  Urinary symptoms may be related to Jardiance.  Of note patient has lost 8 pounds since the previous visit.  This weight loss is after removal of her PEG tube which she reports she was not using prior to its removal.  She reports that she has not been drinking ensures as prescribed.  Transitions to Glucerna and recommended 3 times a day.  Follow-up in 1 month for weight check.  If weight continues to drop consider discontinuation of Jardiance but will need to increase Lantus dose.  Dysuria Several days of dysuria.   Also reports increased frequency.  Urinalysis within normal limits other than increased glucose most likely due to Jardiance.  Wet prep negative for any abnormalities.  Physical exam concerning for vaginal atrophy.  Recommended moisturizing lotions.  Patient may require estrogen-based creams if symptoms continue.     Gifford Shave, MD [place]

## 2021-10-25 DIAGNOSIS — R300 Dysuria: Secondary | ICD-10-CM | POA: Insufficient documentation

## 2021-10-25 NOTE — Assessment & Plan Note (Signed)
Several days of dysuria.  Also reports increased frequency.  Urinalysis within normal limits other than increased glucose most likely due to Jardiance.  Wet prep negative for any abnormalities.  Physical exam concerning for vaginal atrophy.  Recommended moisturizing lotions.  Patient may require estrogen-based creams if symptoms continue.

## 2021-10-25 NOTE — Assessment & Plan Note (Signed)
Currently doing well on Lantus 6 units daily and Jardiance 10 mg daily.  Hemoglobin A1c today of 7.8.  Discussed continuation of Lantus and Jardiance at prescribed dose.  Urinary symptoms may be related to Jardiance.  Of note patient has lost 8 pounds since the previous visit.  This weight loss is after removal of her PEG tube which she reports she was not using prior to its removal.  She reports that she has not been drinking ensures as prescribed.  Transitions to Glucerna and recommended 3 times a day.  Follow-up in 1 month for weight check.  If weight continues to drop consider discontinuation of Jardiance but will need to increase Lantus dose.

## 2021-10-27 DIAGNOSIS — I5020 Unspecified systolic (congestive) heart failure: Secondary | ICD-10-CM | POA: Diagnosis not present

## 2021-10-27 DIAGNOSIS — Z7984 Long term (current) use of oral hypoglycemic drugs: Secondary | ICD-10-CM | POA: Diagnosis not present

## 2021-10-27 DIAGNOSIS — Z7982 Long term (current) use of aspirin: Secondary | ICD-10-CM | POA: Diagnosis not present

## 2021-10-27 DIAGNOSIS — Z931 Gastrostomy status: Secondary | ICD-10-CM | POA: Diagnosis not present

## 2021-10-27 DIAGNOSIS — E1136 Type 2 diabetes mellitus with diabetic cataract: Secondary | ICD-10-CM | POA: Diagnosis not present

## 2021-10-27 DIAGNOSIS — G4700 Insomnia, unspecified: Secondary | ICD-10-CM | POA: Diagnosis not present

## 2021-10-27 DIAGNOSIS — Z794 Long term (current) use of insulin: Secondary | ICD-10-CM | POA: Diagnosis not present

## 2021-10-27 DIAGNOSIS — D649 Anemia, unspecified: Secondary | ICD-10-CM | POA: Diagnosis not present

## 2021-10-27 DIAGNOSIS — I2510 Atherosclerotic heart disease of native coronary artery without angina pectoris: Secondary | ICD-10-CM | POA: Diagnosis not present

## 2021-10-27 DIAGNOSIS — I4891 Unspecified atrial fibrillation: Secondary | ICD-10-CM | POA: Diagnosis not present

## 2021-10-27 DIAGNOSIS — E785 Hyperlipidemia, unspecified: Secondary | ICD-10-CM | POA: Diagnosis not present

## 2021-10-27 DIAGNOSIS — I110 Hypertensive heart disease with heart failure: Secondary | ICD-10-CM | POA: Diagnosis not present

## 2021-10-27 DIAGNOSIS — Z7901 Long term (current) use of anticoagulants: Secondary | ICD-10-CM | POA: Diagnosis not present

## 2021-10-27 DIAGNOSIS — K219 Gastro-esophageal reflux disease without esophagitis: Secondary | ICD-10-CM | POA: Diagnosis not present

## 2021-10-27 DIAGNOSIS — M8580 Other specified disorders of bone density and structure, unspecified site: Secondary | ICD-10-CM | POA: Diagnosis not present

## 2021-10-27 DIAGNOSIS — M722 Plantar fascial fibromatosis: Secondary | ICD-10-CM | POA: Diagnosis not present

## 2021-10-27 DIAGNOSIS — E43 Unspecified severe protein-calorie malnutrition: Secondary | ICD-10-CM | POA: Diagnosis not present

## 2021-10-27 DIAGNOSIS — Z86711 Personal history of pulmonary embolism: Secondary | ICD-10-CM | POA: Diagnosis not present

## 2021-10-27 DIAGNOSIS — Z9181 History of falling: Secondary | ICD-10-CM | POA: Diagnosis not present

## 2021-10-29 DIAGNOSIS — Z9181 History of falling: Secondary | ICD-10-CM | POA: Diagnosis not present

## 2021-10-29 DIAGNOSIS — Z794 Long term (current) use of insulin: Secondary | ICD-10-CM | POA: Diagnosis not present

## 2021-10-29 DIAGNOSIS — I4891 Unspecified atrial fibrillation: Secondary | ICD-10-CM | POA: Diagnosis not present

## 2021-10-29 DIAGNOSIS — Z7984 Long term (current) use of oral hypoglycemic drugs: Secondary | ICD-10-CM | POA: Diagnosis not present

## 2021-10-29 DIAGNOSIS — M722 Plantar fascial fibromatosis: Secondary | ICD-10-CM | POA: Diagnosis not present

## 2021-10-29 DIAGNOSIS — Z86711 Personal history of pulmonary embolism: Secondary | ICD-10-CM | POA: Diagnosis not present

## 2021-10-29 DIAGNOSIS — E43 Unspecified severe protein-calorie malnutrition: Secondary | ICD-10-CM | POA: Diagnosis not present

## 2021-10-29 DIAGNOSIS — Z7982 Long term (current) use of aspirin: Secondary | ICD-10-CM | POA: Diagnosis not present

## 2021-10-29 DIAGNOSIS — Z7901 Long term (current) use of anticoagulants: Secondary | ICD-10-CM | POA: Diagnosis not present

## 2021-10-29 DIAGNOSIS — I5020 Unspecified systolic (congestive) heart failure: Secondary | ICD-10-CM | POA: Diagnosis not present

## 2021-10-29 DIAGNOSIS — Z931 Gastrostomy status: Secondary | ICD-10-CM | POA: Diagnosis not present

## 2021-10-29 DIAGNOSIS — K219 Gastro-esophageal reflux disease without esophagitis: Secondary | ICD-10-CM | POA: Diagnosis not present

## 2021-10-29 DIAGNOSIS — G4700 Insomnia, unspecified: Secondary | ICD-10-CM | POA: Diagnosis not present

## 2021-10-29 DIAGNOSIS — E785 Hyperlipidemia, unspecified: Secondary | ICD-10-CM | POA: Diagnosis not present

## 2021-10-29 DIAGNOSIS — M8580 Other specified disorders of bone density and structure, unspecified site: Secondary | ICD-10-CM | POA: Diagnosis not present

## 2021-10-29 DIAGNOSIS — I2510 Atherosclerotic heart disease of native coronary artery without angina pectoris: Secondary | ICD-10-CM | POA: Diagnosis not present

## 2021-10-29 DIAGNOSIS — D649 Anemia, unspecified: Secondary | ICD-10-CM | POA: Diagnosis not present

## 2021-10-29 DIAGNOSIS — I110 Hypertensive heart disease with heart failure: Secondary | ICD-10-CM | POA: Diagnosis not present

## 2021-10-29 DIAGNOSIS — E1136 Type 2 diabetes mellitus with diabetic cataract: Secondary | ICD-10-CM | POA: Diagnosis not present

## 2021-10-30 ENCOUNTER — Ambulatory Visit
Admission: RE | Admit: 2021-10-30 | Discharge: 2021-10-30 | Disposition: A | Discharge status: Home / Self Care | Payer: Commercial Managed Care - HMO | Source: Ambulatory Visit | Attending: Family Medicine | Admitting: Family Medicine

## 2021-10-30 ENCOUNTER — Other Ambulatory Visit: Payer: Self-pay

## 2021-10-30 DIAGNOSIS — Z1231 Encounter for screening mammogram for malignant neoplasm of breast: Secondary | ICD-10-CM

## 2021-11-03 DIAGNOSIS — M722 Plantar fascial fibromatosis: Secondary | ICD-10-CM | POA: Diagnosis not present

## 2021-11-03 DIAGNOSIS — I4891 Unspecified atrial fibrillation: Secondary | ICD-10-CM | POA: Diagnosis not present

## 2021-11-03 DIAGNOSIS — K219 Gastro-esophageal reflux disease without esophagitis: Secondary | ICD-10-CM | POA: Diagnosis not present

## 2021-11-03 DIAGNOSIS — I5020 Unspecified systolic (congestive) heart failure: Secondary | ICD-10-CM | POA: Diagnosis not present

## 2021-11-03 DIAGNOSIS — M8580 Other specified disorders of bone density and structure, unspecified site: Secondary | ICD-10-CM | POA: Diagnosis not present

## 2021-11-03 DIAGNOSIS — Z931 Gastrostomy status: Secondary | ICD-10-CM | POA: Diagnosis not present

## 2021-11-03 DIAGNOSIS — Z7984 Long term (current) use of oral hypoglycemic drugs: Secondary | ICD-10-CM | POA: Diagnosis not present

## 2021-11-03 DIAGNOSIS — Z794 Long term (current) use of insulin: Secondary | ICD-10-CM | POA: Diagnosis not present

## 2021-11-03 DIAGNOSIS — D649 Anemia, unspecified: Secondary | ICD-10-CM | POA: Diagnosis not present

## 2021-11-03 DIAGNOSIS — E785 Hyperlipidemia, unspecified: Secondary | ICD-10-CM | POA: Diagnosis not present

## 2021-11-03 DIAGNOSIS — Z7901 Long term (current) use of anticoagulants: Secondary | ICD-10-CM | POA: Diagnosis not present

## 2021-11-03 DIAGNOSIS — E1136 Type 2 diabetes mellitus with diabetic cataract: Secondary | ICD-10-CM | POA: Diagnosis not present

## 2021-11-03 DIAGNOSIS — Z7982 Long term (current) use of aspirin: Secondary | ICD-10-CM | POA: Diagnosis not present

## 2021-11-03 DIAGNOSIS — I2510 Atherosclerotic heart disease of native coronary artery without angina pectoris: Secondary | ICD-10-CM | POA: Diagnosis not present

## 2021-11-03 DIAGNOSIS — Z9181 History of falling: Secondary | ICD-10-CM | POA: Diagnosis not present

## 2021-11-03 DIAGNOSIS — G4700 Insomnia, unspecified: Secondary | ICD-10-CM | POA: Diagnosis not present

## 2021-11-03 DIAGNOSIS — Z86711 Personal history of pulmonary embolism: Secondary | ICD-10-CM | POA: Diagnosis not present

## 2021-11-03 DIAGNOSIS — E43 Unspecified severe protein-calorie malnutrition: Secondary | ICD-10-CM | POA: Diagnosis not present

## 2021-11-03 DIAGNOSIS — I110 Hypertensive heart disease with heart failure: Secondary | ICD-10-CM | POA: Diagnosis not present

## 2021-11-05 ENCOUNTER — Emergency Department (HOSPITAL_COMMUNITY): Payer: Medicare Other

## 2021-11-05 ENCOUNTER — Other Ambulatory Visit: Payer: Self-pay

## 2021-11-05 ENCOUNTER — Inpatient Hospital Stay (HOSPITAL_COMMUNITY)
Admission: EM | Admit: 2021-11-05 | Discharge: 2021-11-08 | DRG: 689 | Disposition: A | Discharge status: Home Health | Payer: Medicare Other | Attending: Family Medicine | Admitting: Family Medicine

## 2021-11-05 ENCOUNTER — Telehealth: Payer: Self-pay

## 2021-11-05 DIAGNOSIS — Z7982 Long term (current) use of aspirin: Secondary | ICD-10-CM

## 2021-11-05 DIAGNOSIS — I5020 Unspecified systolic (congestive) heart failure: Secondary | ICD-10-CM | POA: Diagnosis present

## 2021-11-05 DIAGNOSIS — E860 Dehydration: Secondary | ICD-10-CM | POA: Diagnosis present

## 2021-11-05 DIAGNOSIS — I4510 Unspecified right bundle-branch block: Secondary | ICD-10-CM | POA: Diagnosis not present

## 2021-11-05 DIAGNOSIS — M722 Plantar fascial fibromatosis: Secondary | ICD-10-CM | POA: Diagnosis not present

## 2021-11-05 DIAGNOSIS — E7800 Pure hypercholesterolemia, unspecified: Secondary | ICD-10-CM | POA: Diagnosis present

## 2021-11-05 DIAGNOSIS — Z8249 Family history of ischemic heart disease and other diseases of the circulatory system: Secondary | ICD-10-CM

## 2021-11-05 DIAGNOSIS — I2510 Atherosclerotic heart disease of native coronary artery without angina pectoris: Secondary | ICD-10-CM | POA: Diagnosis present

## 2021-11-05 DIAGNOSIS — E1169 Type 2 diabetes mellitus with other specified complication: Secondary | ICD-10-CM | POA: Diagnosis present

## 2021-11-05 DIAGNOSIS — E119 Type 2 diabetes mellitus without complications: Secondary | ICD-10-CM | POA: Diagnosis not present

## 2021-11-05 DIAGNOSIS — I5022 Chronic systolic (congestive) heart failure: Secondary | ICD-10-CM | POA: Diagnosis present

## 2021-11-05 DIAGNOSIS — I110 Hypertensive heart disease with heart failure: Secondary | ICD-10-CM | POA: Diagnosis present

## 2021-11-05 DIAGNOSIS — N12 Tubulo-interstitial nephritis, not specified as acute or chronic: Principal | ICD-10-CM | POA: Diagnosis present

## 2021-11-05 DIAGNOSIS — Z794 Long term (current) use of insulin: Secondary | ICD-10-CM

## 2021-11-05 DIAGNOSIS — R109 Unspecified abdominal pain: Secondary | ICD-10-CM

## 2021-11-05 DIAGNOSIS — N179 Acute kidney failure, unspecified: Secondary | ICD-10-CM | POA: Diagnosis not present

## 2021-11-05 DIAGNOSIS — Z20822 Contact with and (suspected) exposure to covid-19: Secondary | ICD-10-CM | POA: Diagnosis present

## 2021-11-05 DIAGNOSIS — N390 Urinary tract infection, site not specified: Secondary | ICD-10-CM

## 2021-11-05 DIAGNOSIS — R319 Hematuria, unspecified: Secondary | ICD-10-CM | POA: Diagnosis present

## 2021-11-05 DIAGNOSIS — Z86711 Personal history of pulmonary embolism: Secondary | ICD-10-CM | POA: Diagnosis not present

## 2021-11-05 DIAGNOSIS — Z809 Family history of malignant neoplasm, unspecified: Secondary | ICD-10-CM

## 2021-11-05 DIAGNOSIS — N281 Cyst of kidney, acquired: Secondary | ICD-10-CM | POA: Diagnosis present

## 2021-11-05 DIAGNOSIS — E876 Hypokalemia: Secondary | ICD-10-CM | POA: Diagnosis not present

## 2021-11-05 DIAGNOSIS — Z6821 Body mass index (BMI) 21.0-21.9, adult: Secondary | ICD-10-CM

## 2021-11-05 DIAGNOSIS — I4891 Unspecified atrial fibrillation: Secondary | ICD-10-CM | POA: Diagnosis present

## 2021-11-05 DIAGNOSIS — Z853 Personal history of malignant neoplasm of breast: Secondary | ICD-10-CM

## 2021-11-05 DIAGNOSIS — M47812 Spondylosis without myelopathy or radiculopathy, cervical region: Secondary | ICD-10-CM | POA: Diagnosis not present

## 2021-11-05 DIAGNOSIS — D61818 Other pancytopenia: Secondary | ICD-10-CM | POA: Diagnosis present

## 2021-11-05 DIAGNOSIS — Z79899 Other long term (current) drug therapy: Secondary | ICD-10-CM

## 2021-11-05 DIAGNOSIS — I252 Old myocardial infarction: Secondary | ICD-10-CM

## 2021-11-05 DIAGNOSIS — D649 Anemia, unspecified: Secondary | ICD-10-CM | POA: Diagnosis not present

## 2021-11-05 DIAGNOSIS — E43 Unspecified severe protein-calorie malnutrition: Secondary | ICD-10-CM | POA: Diagnosis present

## 2021-11-05 DIAGNOSIS — Z7984 Long term (current) use of oral hypoglycemic drugs: Secondary | ICD-10-CM | POA: Diagnosis not present

## 2021-11-05 DIAGNOSIS — Z9181 History of falling: Secondary | ICD-10-CM | POA: Diagnosis not present

## 2021-11-05 DIAGNOSIS — Z8541 Personal history of malignant neoplasm of cervix uteri: Secondary | ICD-10-CM

## 2021-11-05 DIAGNOSIS — E785 Hyperlipidemia, unspecified: Secondary | ICD-10-CM | POA: Diagnosis not present

## 2021-11-05 DIAGNOSIS — Z7901 Long term (current) use of anticoagulants: Secondary | ICD-10-CM | POA: Diagnosis not present

## 2021-11-05 DIAGNOSIS — M8580 Other specified disorders of bone density and structure, unspecified site: Secondary | ICD-10-CM | POA: Diagnosis not present

## 2021-11-05 DIAGNOSIS — E1136 Type 2 diabetes mellitus with diabetic cataract: Secondary | ICD-10-CM | POA: Diagnosis not present

## 2021-11-05 DIAGNOSIS — K219 Gastro-esophageal reflux disease without esophagitis: Secondary | ICD-10-CM | POA: Diagnosis not present

## 2021-11-05 DIAGNOSIS — I951 Orthostatic hypotension: Secondary | ICD-10-CM | POA: Diagnosis present

## 2021-11-05 DIAGNOSIS — B9620 Unspecified Escherichia coli [E. coli] as the cause of diseases classified elsewhere: Secondary | ICD-10-CM | POA: Diagnosis present

## 2021-11-05 DIAGNOSIS — G4700 Insomnia, unspecified: Secondary | ICD-10-CM | POA: Diagnosis not present

## 2021-11-05 DIAGNOSIS — Z931 Gastrostomy status: Secondary | ICD-10-CM | POA: Diagnosis not present

## 2021-11-05 DIAGNOSIS — S0990XA Unspecified injury of head, initial encounter: Secondary | ICD-10-CM | POA: Diagnosis not present

## 2021-11-05 DIAGNOSIS — Z823 Family history of stroke: Secondary | ICD-10-CM

## 2021-11-05 DIAGNOSIS — R0602 Shortness of breath: Secondary | ICD-10-CM | POA: Diagnosis not present

## 2021-11-05 DIAGNOSIS — Z833 Family history of diabetes mellitus: Secondary | ICD-10-CM

## 2021-11-05 HISTORY — DX: Urinary tract infection, site not specified: N39.0

## 2021-11-05 LAB — URINALYSIS, ROUTINE W REFLEX MICROSCOPIC
Bilirubin Urine: NEGATIVE
Glucose, UA: >=500 mg/dL — AB
Ketones, ur: 20 mg/dL — AB
Nitrite: NEGATIVE
Protein, ur: 100 mg/dL — AB
RBC / HPF: >50 RBC/hpf — ABNORMAL HIGH (ref 0–5)
Specific Gravity, Urine: 1.017 (ref 1.005–1.030)
WBC, UA: >50 WBC/hpf — ABNORMAL HIGH (ref 0–5)
pH: 5 (ref 5.0–8.0)

## 2021-11-05 LAB — CBC
HCT: 38.9 % (ref 36.0–46.0)
Hemoglobin: 12.5 g/dL (ref 12.0–15.0)
MCH: 28.5 pg (ref 26.0–34.0)
MCHC: 32.1 g/dL (ref 30.0–36.0)
MCV: 88.8 fL (ref 80.0–100.0)
Platelets: 137 10*3/uL — ABNORMAL LOW (ref 150–400)
RBC: 4.38 MIL/uL (ref 3.87–5.11)
RDW: 18.8 % — ABNORMAL HIGH (ref 11.5–15.5)
WBC: 7.7 10*3/uL (ref 4.0–10.5)
nRBC: 0 % (ref 0.0–0.2)

## 2021-11-05 LAB — HEPATIC FUNCTION PANEL
ALT: 16 U/L (ref 0–44)
AST: 27 U/L (ref 15–41)
Albumin: 3.1 g/dL — ABNORMAL LOW (ref 3.5–5.0)
Alkaline Phosphatase: 67 U/L (ref 38–126)
Bilirubin, Direct: 0.2 mg/dL (ref 0.0–0.2)
Indirect Bilirubin: 0.4 mg/dL (ref 0.3–0.9)
Total Bilirubin: 0.6 mg/dL (ref 0.3–1.2)
Total Protein: 7.4 g/dL (ref 6.5–8.1)

## 2021-11-05 LAB — BASIC METABOLIC PANEL
Anion gap: 15 (ref 5–15)
BUN: 19 mg/dL (ref 8–23)
CO2: 22 mmol/L (ref 22–32)
Calcium: 9 mg/dL (ref 8.9–10.3)
Chloride: 100 mmol/L (ref 98–111)
Creatinine, Ser: 1.7 mg/dL — ABNORMAL HIGH (ref 0.44–1.00)
GFR, Estimated: 30 mL/min — ABNORMAL LOW (ref >60)
Glucose, Bld: 156 mg/dL — ABNORMAL HIGH (ref 70–99)
Potassium: 3.9 mmol/L (ref 3.5–5.1)
Sodium: 137 mmol/L (ref 135–145)

## 2021-11-05 LAB — RESP PANEL BY RT-PCR (FLU A&B, COVID) ARPGX2
Influenza A by PCR: NEGATIVE
Influenza B by PCR: NEGATIVE
SARS Coronavirus 2 by RT PCR: NEGATIVE

## 2021-11-05 LAB — LIPASE, BLOOD: Lipase: 24 U/L (ref 11–51)

## 2021-11-05 LAB — LACTIC ACID, PLASMA: Lactic Acid, Venous: 1.4 mmol/L (ref 0.5–1.9)

## 2021-11-05 MED ORDER — APIXABAN 2.5 MG PO TABS
2.5000 mg | ORAL_TABLET | Freq: Two times a day (BID) | ORAL | Status: DC
  Administered 2021-11-05: 2.5 mg via ORAL
  Filled 2021-11-05: qty 1

## 2021-11-05 MED ORDER — METOPROLOL TARTRATE 25 MG PO TABS
25.0000 mg | ORAL_TABLET | Freq: Two times a day (BID) | ORAL | Status: DC
  Administered 2021-11-05 – 2021-11-08 (×6): 25 mg via ORAL
  Filled 2021-11-05 (×6): qty 1

## 2021-11-05 MED ORDER — MELATONIN 3 MG PO TABS
6.0000 mg | ORAL_TABLET | Freq: Every day | ORAL | Status: DC
  Administered 2021-11-05 – 2021-11-07 (×3): 6 mg via ORAL
  Filled 2021-11-05 (×3): qty 2

## 2021-11-05 MED ORDER — AMIODARONE HCL 200 MG PO TABS
100.0000 mg | ORAL_TABLET | Freq: Every day | ORAL | Status: DC
  Administered 2021-11-06 – 2021-11-08 (×3): 100 mg via ORAL
  Filled 2021-11-05 (×3): qty 1

## 2021-11-05 MED ORDER — INSULIN GLARGINE 100 UNIT/ML SOLOSTAR PEN
6.0000 [IU] | PEN_INJECTOR | Freq: Every day | SUBCUTANEOUS | Status: DC
Start: 2021-11-06 — End: 2021-11-05

## 2021-11-05 MED ORDER — ROSUVASTATIN CALCIUM 20 MG PO TABS
20.0000 mg | ORAL_TABLET | Freq: Every day | ORAL | Status: DC
  Administered 2021-11-06 – 2021-11-08 (×3): 20 mg via ORAL
  Filled 2021-11-05 (×3): qty 1

## 2021-11-05 MED ORDER — ASPIRIN EC 81 MG PO TBEC
81.0000 mg | DELAYED_RELEASE_TABLET | Freq: Every day | ORAL | Status: DC
  Administered 2021-11-06 – 2021-11-08 (×3): 81 mg via ORAL
  Filled 2021-11-05 (×3): qty 1

## 2021-11-05 MED ORDER — GLUCERNA SHAKE PO LIQD
237.0000 mL | Freq: Three times a day (TID) | ORAL | Status: DC
  Administered 2021-11-06 – 2021-11-08 (×6): 237 mL via ORAL
  Filled 2021-11-05 (×3): qty 237

## 2021-11-05 MED ORDER — NITROGLYCERIN 0.4 MG SL SUBL: 0.4000 mg | SUBLINGUAL_TABLET | SUBLINGUAL | Status: DC | PRN

## 2021-11-05 MED ORDER — AMIODARONE HCL 200 MG PO TABS: 100.0000 mg | ORAL_TABLET | Freq: Every day | ORAL | Status: DC

## 2021-11-05 MED ORDER — TRAZODONE HCL 50 MG PO TABS: 50.0000 mg | ORAL_TABLET | ORAL | Status: DC | PRN

## 2021-11-05 MED ORDER — PANTOPRAZOLE SODIUM 40 MG PO TBEC
80.0000 mg | DELAYED_RELEASE_TABLET | Freq: Every day | ORAL | Status: DC
  Administered 2021-11-06 – 2021-11-08 (×3): 80 mg via ORAL
  Filled 2021-11-05 (×3): qty 2

## 2021-11-05 MED ORDER — LACTATED RINGERS IV SOLN: INTRAVENOUS | Status: AC

## 2021-11-05 MED ORDER — ONDANSETRON HCL 4 MG PO TABS: 4.0000 mg | ORAL_TABLET | Freq: Every day | ORAL | Status: DC | PRN

## 2021-11-05 MED ORDER — INSULIN GLARGINE-YFGN 100 UNIT/ML ~~LOC~~ SOLN
6.0000 [IU] | Freq: Every day | SUBCUTANEOUS | Status: DC
  Administered 2021-11-06 – 2021-11-08 (×3): 6 [IU] via SUBCUTANEOUS
  Filled 2021-11-05 (×4): qty 0.06

## 2021-11-05 MED ORDER — LACTATED RINGERS IV BOLUS
1000.0000 mL | Freq: Once | INTRAVENOUS | Status: AC
  Administered 2021-11-05: 22:00:00 1000 mL via INTRAVENOUS

## 2021-11-05 MED ORDER — SODIUM CHLORIDE 0.9 % IV SOLN
1.0000 g | INTRAVENOUS | Status: DC
  Administered 2021-11-05 – 2021-11-07 (×3): 1 g via INTRAVENOUS
  Filled 2021-11-05 (×4): qty 10

## 2021-11-05 MED ORDER — ACETAMINOPHEN 325 MG PO TABS
650.0000 mg | ORAL_TABLET | Freq: Four times a day (QID) | ORAL | Status: DC | PRN
  Administered 2021-11-05 – 2021-11-07 (×2): 650 mg via ORAL
  Filled 2021-11-05 (×2): qty 2

## 2021-11-05 MED ORDER — APIXABAN 5 MG PO TABS
5.0000 mg | ORAL_TABLET | Freq: Two times a day (BID) | ORAL | Status: DC
Start: 2021-11-05 — End: 2021-11-05

## 2021-11-05 MED ORDER — ACETAMINOPHEN 650 MG RE SUPP: 650.0000 mg | Freq: Four times a day (QID) | RECTAL | Status: DC | PRN

## 2021-11-05 NOTE — Hospital Course (Addendum)
Hematuria with concern for Pyelonephritis/?Bladder Mass This morning patient's abdomen was soft and had right CVA tenderness that has remained afebrile.  UA was cloudy and positive for hematuria, anuria pyuria and glycosuria.  Renal ultrasound showed 5.6 cm complex multi septated cystic abnormality is noted in upper pole left kidney. Multiple other bilateral simple renal cysts are noted. Abnormal solid abnormality seen posteriorly in the urinary bladder which may represent layering debris, although possible mass cannot be excluded. Cystoscopy is recommended for further evaluation. Has had weight loss as well.  Urine culture grew greater than 100,000 colonies of E. coli and patient was transitioned from ceftriaxone (3 days) to Keflex on day of discharge (7 days) for a total of 10-day antibiotics course.  Urology appointment was made at discharge.  Urology appointment was made at discharge.  Fall with weakness Patient was dizzy and had a fall prior to admission.  Cervical and head CT were negative.  Patient did have orthostatic vitals performed which showed 20 point drop in systolic when standing.  Patient was also on multiple antihypertensives/agents that could drop blood pressure which was adjusted as described in follow-up recommendations below.  HFrEF Patient was given gentle IV fluids initially and remained euvolemic during admission without dyspnea.  AKI Creatinine on admission was 1.70 with baseline around 0.7. IVF were given due to dehydration and polyuria. Creatinine at end of hospitalization was 1.05.  Chronic conditions remained stable

## 2021-11-05 NOTE — Telephone Encounter (Signed)
Spoke with Amy [facility] nurse who states she went out for a home visit today and reports, "the patient did not look good." Amy reports the patient complained of dysuria and Amy noticed blood in the toilet. Amy reports the patient was unsure how long it had been there. Amy reports 99.1 temperature and BP was low for her.   Amy reports she advised the family to take her to the ED for evaluation.   Patient in ED now.

## 2021-11-05 NOTE — ED Triage Notes (Signed)
Nurse went visits patient once a week, today found patient with fever, chills, weak with low BP. Increased SOB and gross amount of blood in urine. Also fell this morning but denies any injury. Patient states she felt bad yesterday but worse today.

## 2021-11-05 NOTE — H&P (Addendum)
[facility] Admission History and Physical Service Pager: [phone]  Patient name: Dana Coleman Medical record number: [id number] Date of birth: March 14, 1941 Age: 81 y.o. Gender: female  Primary Care Provider: Gifford Shave, MD Consultants: None Code Status: Full  Preferred Emergency Contact:  Contact Information     Name Relation Home Work [place] Sister   [phone]        Chief Complaint: Hematuria  Assessment and Plan: KANYLA OMEARA is a 81 y.o. female presenting with hematuria. PMH is significant for HFrEF, T2DM, insomnia, A. fib with RVR, HLD, HTN, CAD and GERD  Pyelonephritis Patient presented to the ED for concerns for hematuria.  Prior to that patient endorses, polyuria with no dysuria, loss of appetite, subjective chills, generalized weakness with recent fall and no loss of consciousness. On physical exam patient has dry mucous membrane, positive right CVA tenderness and right upper quadrant abdominal tenderness.  She was afebrile on admission but mildly hypotensive.  Initial labs in the ED shows lipase, lactic acid, AST and ALT within normal limits.  UA was cloudy and positive for, hematuria, ketonuria, pyuria and glycosuria. Nitrite was negative but with many bacteria.  Patient is on Jardiance for her diabetes which is a risk factor for UTI.  Patient's overall presentations and lab findings is consistent with pyelonephritis likely secondary to UTI.  We will continue patient on maintenance IV fluid and ceftriaxone for abx coverage.  Other differentials on the list but less likely includes kidney stones (leukocytes and CVA tenderness) or appendicitis (no rebound tenderness and negative Murphy sign). - Admit to med surg with FPTS, attending Dr. Owens Shark - Continuous cardiac monitoring -PT/OT eval and treat -Fall precaution -Placed on NPO -Continue maintenance IV fluid -Consider ceftriaxone for antibiotic, consider  transition to oral antibiotics either today or tomorrow. - Follow-up on urine culture - Renal ultrasound - Plan for discontinuing Jardiance prior to discharge -Continue routine vitals -Eliquis  for VTE prophylaxis  Fall with weakness Patient presented after 1 episode of fall this morning.  She reported being dizzy prior to fall without loss of consciousness.  Denies any chest pains or palpitations.  Endorses polyuria in the last couple of days and decreased fluid and p.o. intake due to loss of appetite.  On admissions patient CMP was unremarkable with no electrolyte abnormalities.  Cervical and head CT were negative.  He has a history of A. fib which could be concerning for syncope.  However given patient's presentation and physical exam with dry mucous membrane, her fall is likely due to dizziness the setting of dehydration. -Continue to monitor cardiac rhythm -Continue maintenance IV fluid at 125 mil/hr -Fall precaution  AKI Patient's creatinine on admission was 1.70 with baseline around 0.7.GFR > 60.  Most likely prerenal AKI due to dehydration and polyuria.  On exam patient appears to have dry mucous membrane.  He is currently on maintenance IV fluids we will continue monitoring creatinine with daily labs. -Avoid nephrotoxic agent - Continue maintenance IV fluids -Replete electrolytes as needed -Hold Entresto -Obtain BMP lab in the AM  HFrEF Patient's echo in August 2022 showed EF of 25-30% with mild left ventricular hypertrophy and severe decreased function.  On exam patient appears to be dehydrated and has no sign of volume overload with no evidence of JVD, BLE edema or crackles on pulmonary exam.  Medication include Entresto and Lasix.  Holding home medications due to low blood pressure on admission and concerns for mild dehydration.  Will consider restarting medication once BP stable. -Continue to hold Lasix, Entresto -Monitor daily weight  Hx of A. fib with RVR Patient's rhythm on  admission was A.fib with right bundle branch block..  Home medication include amiodarone 100 mg daily, Eliquis 5 mg twice daily, and metoprolol 25 mg BID.  Patient's weight and renal clearance qualifies for reduced dose of Eliquis.  Start patient on Eliquis 2.5 mg bid and consider restarting home medication of those at discharge. -Continue home medication -On cardiac monitoring  T2DM Last A1c 3 months ago was 6.7 and blood glucose on admission was 156. Home medication include  Lantus 6 unit daily, Jardiance 10 mg daily - Moderate SSI -Hold home medication of Jardiance -Continue monitoring CBGs -Follow-up A1c   HLD Last lipid panel 2 years ago was cholesterol 135, triglyceride 104, HDL 3053 and LDL 63.  Will obtain lipid panel.  Home medication include rosuvastatin 20 mg at bedtime.  -Continue home medication -Follow-up with lipid panel  HTN Patient was mildly hypotensive on admission with BP of 101/66.  This is likely due to poor p.o. intake and polyuria.  She is currently on maintenance IV fluid with improved BP of 152/76.  Home medication include Entresto, Lasix 40 mg daily, metoprolol 25 mg twice daily, spironolactone 12.5 mg daily.  We will hold home medication due to soft BP and consider restarting with improved BP. -Hold BP home medications -Continue routine vitals   Insomnia Patient home medication includes trazodone 50 mg as needed. -Continue home medication  GERD Chronic, stable.  Home medications include omeprazole 40 mg in the morning. - Continue home medication  CAD History of NSTEMI back in 04/2021 that did not require intervention.  Home medication includes aspirin and Nitrostat. -Continue home medication     FEN/GI: Heart healthy/carb modified diet Prophylaxis: Eliquis 2.5 mg BID  Disposition: Admit to MedSurg  History of Present Illness:  Dana Coleman is a 81 y.o. female presenting with weakness, fall and hematuria.  Patient reports that she is came in  after her home nurse noticed blood in the toilet after she had used the bathroom.  Patient reports she has been feeling weak and had fallen earlier today due to weakness. She reports feeling dizzy before the fall with no loss of consciousness.  She has had increased voiding in the last couple of days and also has decreased appetite as well within the last week. No fever but express subjective chills, unsure if this is due to the weather or her feeling sick.  Patient has had poor p.o. intake during this time and says she has not been drinking as much water lately either.  She denies any pain or discomfort with urination.  She was recently given medication for yeast infection by daughter who said this was over-the-counter medication.  Patient does not remember the name of the medication but has completed 4 day course of the med Denies any headaches, fever or abdominal pain.  No blood in stool recently.  Review Of Systems: Per HPI with the following additions:   Review of Systems  Respiratory:  Negative for cough and chest tightness.   Cardiovascular:  Negative for chest pain, palpitations and leg swelling.  Gastrointestinal:  Negative for abdominal distention, blood in stool, diarrhea, nausea and vomiting.  Genitourinary:  Positive for flank pain, frequency, hematuria and pelvic pain. Negative for dysuria.  Neurological:  Positive for weakness and light-headedness.    Patient Active Problem List   Diagnosis Date Noted  Dysuria 10/25/2021   Gastrostomy tube in place [facility]) 08/21/2021   PEG (percutaneous endoscopic gastrostomy) status ([place]) 07/23/2021   Failure to thrive in adult 07/22/2021   Abdominal pain    Dyspnea    On enteral nutrition 05/25/2021   Impaired functional mobility, balance, gait, and endurance 05/25/2021   S/P thoracotomy    NSTEMI (non-ST elevated myocardial infarction) ([place]) 05/23/2021   Heart failure with reduced ejection fraction ([place]) 05/23/2021   Empyema ([place])    Acute  pulmonary embolism (HCC)    Acute respiratory failure with hypoxemia (HCC)    Acute pulmonary edema (HCC)    Acute respiratory failure with hypoxia (HCC)    Acute renal failure (HCC)    Hydropneumothorax    Protein-calorie malnutrition, severe 05/15/2021   Pressure injury of skin 05/05/2021   Esophageal perforation 05/05/2021   GERD (gastroesophageal reflux disease) 12/19/2020   Breast lump 12/07/2018   Type 2 diabetes mellitus with other specified complication ([place]) 27/25/3664   Debility 06/30/2011   History of thyroid nodule 01/16/2011   History of breast cancer (right)  stage 1 ERA positive    History of cervical cancer    Hyperlipidemia    Hypertension    CAD (coronary artery disease), native coronary artery     Past Medical History: Past Medical History:  Diagnosis Date   Allergy    occ uses OTC allergy meds    Arthritis    fingers   Breast cancer ([place]) 04/2008   Right breast   Cataracts, bilateral    removed bilat    Cervical cancer ([place])    When the patient was in her 49s   Depression    Diabetes mellitus without complication ([place])    Heart attack ([place]) 2004   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 Truddie Coco   PEG (percutaneous endoscopic gastrostomy) status ([place]) 07/23/2021   Personal history of radiation therapy 2009   Respiratory failure ([place]) 05/04/2021    Past Surgical History: Past Surgical History:  Procedure Laterality Date   BREAST LUMPECTOMY Right 2009   BREAST SURGERY  2009   right lumpectomy   [place] N/A 05/02/2021   Procedure: LAPAROSCOPIC CHOLECYSTECTOMY;  Surgeon: Greer Pickerel, MD;  Location: [facility] ORS;  Service: General;  Laterality: N/A;   COLONOSCOPY     IR GASTROSTOMY TUBE MOD SED  06/03/2021   IR GASTROSTOMY TUBE REMOVAL  10/08/2021   LEFT HEART CATHETERIZATION WITH CORONARY ANGIOGRAM N/A 04/20/2014   Procedure: LEFT HEART  CATHETERIZATION WITH CORONARY ANGIOGRAM;  Surgeon: Jacolyn Reedy, MD;  Location: [facility] CATH LAB;  Service: Cardiovascular;  Laterality: N/A;   POLYPECTOMY     STENT PLACEMENT VASCULAR ([place] HX)     THORACOTOMY Right 05/05/2021   Procedure: THORACOTOMY MAJOR REPAIR PERFORATED ESOPHAGUS;  Surgeon: Gaye Pollack, MD;  Location: [facility] OR;  Service: Thoracic;  Laterality: Right;   thyroid     for nodule hemithyroidectomy, benign   THYROIDECTOMY Right 03/2003    Social History: Social History   Tobacco Use   Smoking status: Never   Smokeless tobacco: Never   Tobacco comments:    No plans to start  Vaping Use   Vaping Use: Never used  Substance Use Topics   Alcohol use: No   Drug use: No   Additional social history:   Please also refer to relevant sections of EMR.  Family  History: Family History  Problem Relation Age of Onset   Heart disease Mother        age 19   Heart disease Father    Cancer Father        unknown   Heart disease Brother    Hypertension Brother    Cirrhosis Brother    Diabetes Daughter    Stroke Daughter    Kidney disease Daughter    Hypertension Son    Colon cancer Neg Hx    Colon polyps Neg Hx    Esophageal cancer Neg Hx    Stomach cancer Neg Hx    Rectal cancer Neg Hx     Allergies and Medications: No Known Allergies No current facility-administered medications on file prior to encounter.   Current Outpatient Medications on File Prior to Encounter  Medication Sig Dispense Refill   amiodarone (PACERONE) 100 MG tablet Take 1 tablet (100 mg total) by mouth daily. 30 tablet 0   apixaban (ELIQUIS) 5 MG TABS tablet Take 1 tablet (5 mg total) by mouth 2 (two) times daily. 60 tablet 0   aspirin EC 81 MG tablet Take 81 mg by mouth in the morning. Swallow whole.     Blood Glucose Monitoring Suppl MISC One touch ultra glucose monitor. Check blood sugar twice a day.     ENTRESTO 24-26 MG Take 1 tablet by mouth 2 (two) times daily.     feeding supplement,  GLUCERNA SHAKE, (GLUCERNA SHAKE) LIQD Take 237 mLs by mouth 3 (three) times daily between meals. 21330 mL 3   furosemide (LASIX) 40 MG tablet Take 1 tablet (40 mg total) by mouth daily. 30 tablet 0   glucose blood (ONE TOUCH ULTRA TEST) test strip CHECK BLOOD SUGAR TWICE DAILY 100 each 1   insulin glargine (LANTUS SOLOSTAR) 100 UNIT/ML Solostar Pen Inject 6 Units into the skin daily.     Insulin Pen Needle (B-D UF III MINI PEN NEEDLES) 31G X 5 MM MISC INJECT INSULIN VIA PEN 6 TIMES DAILY 200 each 3   JARDIANCE 10 MG TABS tablet TAKE 1 TABLET BY MOUTH EVERY DAY (Patient taking differently: Take 10 mg by mouth daily.) 90 tablet 3   Melatonin 3 MG TBDP Take 6 mg by mouth at bedtime.     metoprolol tartrate (LOPRESSOR) 25 MG tablet Take 1 tablet (25 mg total) by mouth 2 (two) times daily. 30 tablet 1   nitroGLYCERIN (NITROSTAT) 0.4 MG SL tablet Place 1 tablet (0.4 mg total) under the tongue every 5 (five) minutes as needed for chest pain. 25 tablet 1   omeprazole (PRILOSEC) 20 MG capsule Take 40 mg by mouth every morning.     ondansetron (ZOFRAN) 4 MG tablet Take 1 tablet (4 mg total) by mouth daily as needed for nausea or vomiting. 30 tablet 1   OneTouch Delica Lancets 53G MISC 1 Container by Does not apply route as needed. 100 each PRN   rosuvastatin (CRESTOR) 20 MG tablet TAKE 1 TABLET BY MOUTH EVERYDAY AT BEDTIME (Patient taking differently: Take 20 mg by mouth daily.) 90 tablet 1   spironolactone (ALDACTONE) 25 MG tablet TAKE 1/2 TABLET BY MOUTH EVERY DAY 30 tablet 0   traZODone (DESYREL) 50 MG tablet Take 50 mg by mouth as needed for sleep.      Objective: BP (!) 118/52 (BP Location: Left Arm)    Pulse (!) 50    Temp 98.7 F (37.1 C) (Oral)    Resp 18    SpO2 97%  Exam: General:Awake, well appearing, NAD HEENT: Atraumatic, dry mucous membrane CV: RRR, no murmurs, normal S1/S2 Pulm: CTAB, good WOB on RA, no crackles or wheezing Abd: Soft, no distension, RUQ tenderness Skin: dry, warm Ext:  No BLE edema,  MSK: Positive right CVA tenderness Neuro: Oriented x3, no focal neurologic deficit Psych: Pleasant affect  Labs and Imaging: CBC BMET  Recent Labs  Lab 11/05/21 1328  WBC 7.7  HGB 12.5  HCT 38.9  PLT 137*   Recent Labs  Lab 11/05/21 1328  NA 137  K 3.9  CL 100  CO2 22  BUN 19  CREATININE 1.70*  GLUCOSE 156*  CALCIUM 9.0     DG Chest 1 View  Result Date: 11/05/2021 CLINICAL DATA:  Shortness of breath EXAM: CHEST  1 VIEW COMPARISON:  07/22/2021 FINDINGS: The heart size and mediastinal contours are within normal limits. Both lungs are clear. The visualized skeletal structures are unremarkable. IMPRESSION: No active disease. Electronically Signed   By: Kathreen Devoid M.D.   On: 11/05/2021 13:59   CT Head Wo Contrast  Result Date: 11/05/2021 CLINICAL DATA:  Head trauma, minor (Age >= 65y); Neck trauma (Age >= 65y) EXAM: CT HEAD WITHOUT CONTRAST CT CERVICAL SPINE WITHOUT CONTRAST TECHNIQUE: Multidetector CT imaging of the head and cervical spine was performed following the standard protocol without intravenous contrast. Multiplanar CT image reconstructions of the cervical spine were also generated. RADIATION DOSE REDUCTION: This exam was performed according to the departmental dose-optimization program which includes automated exposure control, adjustment of the mA and/or kV according to patient size and/or use of iterative reconstruction technique. COMPARISON:  05/04/2021 FINDINGS: CT HEAD FINDINGS Brain: No evidence of acute infarction, hemorrhage, hydrocephalus, extra-axial collection or mass lesion/mass effect. Mild-moderate low-density changes within the periventricular and subcortical white matter are similar to prior and most compatible with chronic microvascular ischemic change. Mild diffuse cerebral volume loss. Vascular: Atherosclerotic calcifications involving the large vessels of the skull base. No unexpected hyperdense vessel. Skull: Normal. Negative for fracture  or focal lesion. Sinuses/Orbits: No acute finding. Other: None. CT CERVICAL SPINE FINDINGS Alignment: Facet joints are aligned without dislocation or traumatic listhesis. Dens and lateral masses are aligned. Skull base and vertebrae: No acute fracture. No primary bone lesion or focal pathologic process. Soft tissues and spinal canal: No prevertebral fluid or swelling. No visible canal hematoma. Disc levels: Mild degenerative disc disease of C3-4 through C5-6. Mild-to-moderate facet arthropathy is more pronounced on the left. Upper chest: Included lung apices are clear. Other: Postsurgical changes to the thyroid gland. IMPRESSION: 1. No acute intracranial abnormality. 2. No acute cervical spine fracture or subluxation. 3. Chronic microvascular ischemic change and cerebral volume loss. 4. Mild multilevel cervical spondylosis. Electronically Signed   By: Davina Poke D.O.   On: 11/05/2021 14:51   CT Cervical Spine Wo Contrast  Result Date: 11/05/2021 CLINICAL DATA:  Head trauma, minor (Age >= 65y); Neck trauma (Age >= 65y) EXAM: CT HEAD WITHOUT CONTRAST CT CERVICAL SPINE WITHOUT CONTRAST TECHNIQUE: Multidetector CT imaging of the head and cervical spine was performed following the standard protocol without intravenous contrast. Multiplanar CT image reconstructions of the cervical spine were also generated. RADIATION DOSE REDUCTION: This exam was performed according to the departmental dose-optimization program which includes automated exposure control, adjustment of the mA and/or kV according to patient size and/or use of iterative reconstruction technique. COMPARISON:  05/04/2021 FINDINGS: CT HEAD FINDINGS Brain: No evidence of acute infarction, hemorrhage, hydrocephalus, extra-axial collection or mass lesion/mass effect. Mild-moderate low-density  changes within the periventricular and subcortical white matter are similar to prior and most compatible with chronic microvascular ischemic change. Mild diffuse  cerebral volume loss. Vascular: Atherosclerotic calcifications involving the large vessels of the skull base. No unexpected hyperdense vessel. Skull: Normal. Negative for fracture or focal lesion. Sinuses/Orbits: No acute finding. Other: None. CT CERVICAL SPINE FINDINGS Alignment: Facet joints are aligned without dislocation or traumatic listhesis. Dens and lateral masses are aligned. Skull base and vertebrae: No acute fracture. No primary bone lesion or focal pathologic process. Soft tissues and spinal canal: No prevertebral fluid or swelling. No visible canal hematoma. Disc levels: Mild degenerative disc disease of C3-4 through C5-6. Mild-to-moderate facet arthropathy is more pronounced on the left. Upper chest: Included lung apices are clear. Other: Postsurgical changes to the thyroid gland. IMPRESSION: 1. No acute intracranial abnormality. 2. No acute cervical spine fracture or subluxation. 3. Chronic microvascular ischemic change and cerebral volume loss. 4. Mild multilevel cervical spondylosis. Electronically Signed   By: Davina Poke D.O.   On: 11/05/2021 14:51     EKG: A. fib with right bundle branch block similar to prior EKGs.   Alen Bleacher, MD 11/05/2021, 9:45 PM PGY-1, [place] Intern pager: [phone], text pages welcome   FPTS Upper-Level Resident Addendum   I have independently interviewed and examined the patient. I have discussed the above with the original author and agree with their documentation. Please see also any attending notes.   Gifford Shave, MD PGY-3, [facility] 11/06/2021 6:45 AM  FPTS Service pager: [phone] (text pages welcome through [facility])

## 2021-11-05 NOTE — Telephone Encounter (Signed)
Patient calls nurse line reporting continued urinary symptoms. Patient reports frequency, dysuria and weakness. Patient reports she has an apt next week with PCP, however does not want to wait that long. Patient denies body aches, fever/chills, hematuria or flank pain.   Patient scheduled for tomorrow for evaluation.   Red flags discussed in the meantime.

## 2021-11-05 NOTE — ED Provider Triage Note (Signed)
Emergency Medicine Provider Triage Evaluation Note  Dana Coleman , a 81 y.o. female  was evaluated in triage.  Pt complains of generalized weakness, chills, vaginal bleeding for the past few days. She states she has passed some clots. Notice vaginal bleeding today. Recently had her feeding tube removed. Hasn't been eating well over the past few days. Patient fell this morning due to weakness. Denies unilateral weakness, speech changes, or visual changes.   Review of Systems  Positive: Weakness, dysuria, SOB Negative: vomiting  Physical Exam  BP 101/66 (BP Location: Left Arm)    Pulse (!) 53    Temp 98.7 F (37.1 C) (Oral)    Resp 14    SpO2 100%  Gen:   Awake, no distress   Resp:  Normal effort  MSK:   Moves extremities without difficulty  Other:    Medical Decision Making  Medically screening exam initiated at 1:38 PM.  Appropriate orders placed.  Dana Coleman was informed that the remainder of the evaluation will be completed by another provider, this initial triage assessment does not replace that evaluation, and the importance of remaining in the ED until their evaluation is complete.  Labs CT head/cervical spine due to fall today   Suzy Bouchard, [name] 11/05/21 1340

## 2021-11-05 NOTE — ED Provider Notes (Signed)
[facility] EMERGENCY DEPARTMENT Provider Note   CSN: [id number] Arrival date & time: 11/05/21  1243     History  Chief Complaint  Patient presents with   Hematuria    Dana Coleman is a 81 y.o. female.  81 year old female presents with fall which occurred just prior to arrival.  Patient states that she does use a walker and got up to walk to the bathroom and got weak and fell onto her right side.  No head trauma.  No loss of consciousness.  Is also noted to have some hematuria.  Denies any blood per rectum or dark stools.  No chest pain or shortness of breath.  Patient does have a visiting nurse and patient states that she did have some fever and chills and low blood pressure.  Her blood pressure has been stable here.  Does note she has just been feeling diffusely weak.  No focality to it.  Called EMS and transported here      Home Medications Prior to Admission medications   Medication Sig Start Date End Date Taking? Authorizing Provider  amiodarone (PACERONE) 100 MG tablet Take 1 tablet (100 mg total) by mouth daily. 07/26/21   Carollee Leitz, MD  apixaban (ELIQUIS) 5 MG TABS tablet Take 1 tablet (5 mg total) by mouth 2 (two) times daily. 07/26/21 09/12/21  Carollee Leitz, MD  aspirin EC 81 MG tablet Take 81 mg by mouth in the morning. Swallow whole.    [provider]  Blood Glucose Monitoring Suppl MISC One touch ultra glucose monitor. Check blood sugar twice a day.    [provider]  ENTRESTO 24-26 MG Take 1 tablet by mouth 2 (two) times daily. 07/20/21   [provider]  feeding supplement, GLUCERNA SHAKE, (GLUCERNA SHAKE) LIQD Take 237 mLs by mouth 3 (three) times daily between meals. 10/24/21 11/23/21  Gifford Shave, MD  furosemide (LASIX) 40 MG tablet Take 1 tablet (40 mg total) by mouth daily. 07/26/21   Carollee Leitz, MD  glucose blood (ONE TOUCH ULTRA TEST) test strip CHECK BLOOD SUGAR TWICE DAILY 09/24/18   Everrett Coombe, MD   insulin glargine (LANTUS SOLOSTAR) 100 UNIT/ML Solostar Pen Inject 6 Units into the skin daily.    [provider]  Insulin Pen Needle (B-D UF III MINI PEN NEEDLES) 31G X 5 MM MISC INJECT INSULIN VIA PEN 6 TIMES DAILY 02/22/18   Everrett Coombe, MD  JARDIANCE 10 MG TABS tablet TAKE 1 TABLET BY MOUTH EVERY DAY Patient taking differently: Take 10 mg by mouth daily. 12/05/20   Gifford Shave, MD  Melatonin 3 MG TBDP Take 6 mg by mouth at bedtime. 07/20/21   [provider]  metoprolol tartrate (LOPRESSOR) 25 MG tablet Take 1 tablet (25 mg total) by mouth 2 (two) times daily. 07/26/21   Carollee Leitz, MD  nitroGLYCERIN (NITROSTAT) 0.4 MG SL tablet Place 1 tablet (0.4 mg total) under the tongue every 5 (five) minutes as needed for chest pain. 07/12/19   Zenia Resides, MD  omeprazole (PRILOSEC) 20 MG capsule Take 40 mg by mouth every morning. 12/12/20   [provider]  ondansetron (ZOFRAN) 4 MG tablet Take 1 tablet (4 mg total) by mouth daily as needed for nausea or vomiting. 06/17/21 06/17/22  Donney Dice, DO  OneTouch Delica Lancets 40J MISC 1 Container by Does not apply route as needed. 09/12/21   Zenia Resides, MD  rosuvastatin (CRESTOR) 20 MG tablet TAKE 1 TABLET BY MOUTH EVERYDAY  AT BEDTIME Patient taking differently: Take 20 mg by mouth daily. 06/07/21   Gifford Shave, MD  spironolactone (ALDACTONE) 25 MG tablet TAKE 1/2 TABLET BY MOUTH EVERY DAY 09/16/21   Gifford Shave, MD  traZODone (DESYREL) 50 MG tablet Take 50 mg by mouth as needed for sleep. 07/20/21   [provider]      Allergies    Patient has no known allergies.    Review of Systems   Review of Systems  All other systems reviewed and are negative.  Physical Exam Updated Vital Signs BP (!) 118/52 (BP Location: Left Arm)    Pulse (!) 50    Temp 98.7 F (37.1 C) (Oral)    Resp 18    SpO2 97%  Physical Exam Vitals and nursing note reviewed.  Constitutional:      General: She is not in acute  distress.    Appearance: Normal appearance. She is well-developed. She is not toxic-appearing.  HENT:     Head: Normocephalic and atraumatic.  Eyes:     General: Lids are normal.     Conjunctiva/sclera: Conjunctivae normal.     Pupils: Pupils are equal, round, and reactive to light.  Neck:     Thyroid: No thyroid mass.     Trachea: No tracheal deviation.  Cardiovascular:     Rate and Rhythm: Normal rate and regular rhythm.     Heart sounds: Normal heart sounds. No murmur heard.   No gallop.  Pulmonary:     Effort: Pulmonary effort is normal. No respiratory distress.     Breath sounds: Normal breath sounds. No stridor. No decreased breath sounds, wheezing, rhonchi or rales.  Abdominal:     General: There is no distension.     Palpations: Abdomen is soft.     Tenderness: There is no abdominal tenderness. There is no rebound.  Musculoskeletal:        General: No tenderness. Normal range of motion.     Cervical back: Normal range of motion and neck supple.  Skin:    General: Skin is warm and dry.     Findings: No abrasion or rash.  Neurological:     Mental Status: She is alert and oriented to person, place, and time. Mental status is at baseline.     GCS: GCS eye subscore is 4. GCS verbal subscore is 5. GCS motor subscore is 6.     Cranial Nerves: No cranial nerve deficit.     Sensory: No sensory deficit.     Motor: Motor function is intact.  Psychiatric:        Attention and Perception: Attention normal.        Speech: Speech normal.        Behavior: Behavior normal.    ED Results / Procedures / Treatments   Labs (all labs ordered are listed, but only abnormal results are displayed) Labs Reviewed  CBC - Abnormal; Notable for the following components:      Result Value   RDW 18.8 (*)    Platelets 137 (*)    All other components within normal limits  BASIC METABOLIC PANEL - Abnormal; Notable for the following components:   Glucose, Bld 156 (*)    Creatinine, Ser 1.70 (*)     GFR, Estimated 30 (*)    All other components within normal limits  HEPATIC FUNCTION PANEL - Abnormal; Notable for the following components:   Albumin 3.1 (*)    All other components within normal limits  RESP  PANEL BY RT-PCR (FLU A&B, COVID) ARPGX2  LIPASE, BLOOD  URINALYSIS, ROUTINE W REFLEX MICROSCOPIC  LACTIC ACID, PLASMA  LACTIC ACID, PLASMA    EKG   Radiology DG Chest 1 View  Result Date: 11/05/2021 CLINICAL DATA:  Shortness of breath EXAM: CHEST  1 VIEW COMPARISON:  07/22/2021 FINDINGS: The heart size and mediastinal contours are within normal limits. Both lungs are clear. The visualized skeletal structures are unremarkable. IMPRESSION: No active disease. Electronically Signed   By: Kathreen Devoid M.D.   On: 11/05/2021 13:59   CT Head Wo Contrast  Result Date: 11/05/2021 CLINICAL DATA:  Head trauma, minor (Age >= 65y); Neck trauma (Age >= 65y) EXAM: CT HEAD WITHOUT CONTRAST CT CERVICAL SPINE WITHOUT CONTRAST TECHNIQUE: Multidetector CT imaging of the head and cervical spine was performed following the standard protocol without intravenous contrast. Multiplanar CT image reconstructions of the cervical spine were also generated. RADIATION DOSE REDUCTION: This exam was performed according to the departmental dose-optimization program which includes automated exposure control, adjustment of the mA and/or kV according to patient size and/or use of iterative reconstruction technique. COMPARISON:  05/04/2021 FINDINGS: CT HEAD FINDINGS Brain: No evidence of acute infarction, hemorrhage, hydrocephalus, extra-axial collection or mass lesion/mass effect. Mild-moderate low-density changes within the periventricular and subcortical white matter are similar to prior and most compatible with chronic microvascular ischemic change. Mild diffuse cerebral volume loss. Vascular: Atherosclerotic calcifications involving the large vessels of the skull base. No unexpected hyperdense vessel. Skull: Normal.  Negative for fracture or focal lesion. Sinuses/Orbits: No acute finding. Other: None. CT CERVICAL SPINE FINDINGS Alignment: Facet joints are aligned without dislocation or traumatic listhesis. Dens and lateral masses are aligned. Skull base and vertebrae: No acute fracture. No primary bone lesion or focal pathologic process. Soft tissues and spinal canal: No prevertebral fluid or swelling. No visible canal hematoma. Disc levels: Mild degenerative disc disease of C3-4 through C5-6. Mild-to-moderate facet arthropathy is more pronounced on the left. Upper chest: Included lung apices are clear. Other: Postsurgical changes to the thyroid gland. IMPRESSION: 1. No acute intracranial abnormality. 2. No acute cervical spine fracture or subluxation. 3. Chronic microvascular ischemic change and cerebral volume loss. 4. Mild multilevel cervical spondylosis. Electronically Signed   By: Davina Poke D.O.   On: 11/05/2021 14:51   CT Cervical Spine Wo Contrast  Result Date: 11/05/2021 CLINICAL DATA:  Head trauma, minor (Age >= 65y); Neck trauma (Age >= 65y) EXAM: CT HEAD WITHOUT CONTRAST CT CERVICAL SPINE WITHOUT CONTRAST TECHNIQUE: Multidetector CT imaging of the head and cervical spine was performed following the standard protocol without intravenous contrast. Multiplanar CT image reconstructions of the cervical spine were also generated. RADIATION DOSE REDUCTION: This exam was performed according to the departmental dose-optimization program which includes automated exposure control, adjustment of the mA and/or kV according to patient size and/or use of iterative reconstruction technique. COMPARISON:  05/04/2021 FINDINGS: CT HEAD FINDINGS Brain: No evidence of acute infarction, hemorrhage, hydrocephalus, extra-axial collection or mass lesion/mass effect. Mild-moderate low-density changes within the periventricular and subcortical white matter are similar to prior and most compatible with chronic microvascular ischemic  change. Mild diffuse cerebral volume loss. Vascular: Atherosclerotic calcifications involving the large vessels of the skull base. No unexpected hyperdense vessel. Skull: Normal. Negative for fracture or focal lesion. Sinuses/Orbits: No acute finding. Other: None. CT CERVICAL SPINE FINDINGS Alignment: Facet joints are aligned without dislocation or traumatic listhesis. Dens and lateral masses are aligned. Skull base and vertebrae: No acute fracture. No primary bone lesion  or focal pathologic process. Soft tissues and spinal canal: No prevertebral fluid or swelling. No visible canal hematoma. Disc levels: Mild degenerative disc disease of C3-4 through C5-6. Mild-to-moderate facet arthropathy is more pronounced on the left. Upper chest: Included lung apices are clear. Other: Postsurgical changes to the thyroid gland. IMPRESSION: 1. No acute intracranial abnormality. 2. No acute cervical spine fracture or subluxation. 3. Chronic microvascular ischemic change and cerebral volume loss. 4. Mild multilevel cervical spondylosis. Electronically Signed   By: Davina Poke D.O.   On: 11/05/2021 14:51    Procedures Procedures    Medications Ordered in ED Medications  lactated ringers infusion (has no administration in time range)  lactated ringers bolus 1,000 mL (has no administration in time range)    ED Course/ Medical Decision Making/ A&P                           Medical Decision Making Amount and/or Complexity of Data Reviewed Labs: ordered.  Risk Prescription drug management.  Old records reviewed. Patient EKG is unchanged from prior.  Urinalysis does show infection as well as hematuria.  Also has evidence of acute kidney injury.  Given IV hydration here and started on IV Rocephin.  Due to patient's fall patient had a CT of the head and neck which was negative for fracture.  Chest x-ray from interpretation no evidence of acute process.  Due to her weakness as well as acute kidney injury and UTI  patient will require admission.  Will speak with family practice teaching service        Final Clinical Impression(s) / ED Diagnoses Final diagnoses:  None    Rx / DC Orders ED Discharge Orders     None         Lacretia Leigh, MD 11/05/21 2137

## 2021-11-06 ENCOUNTER — Ambulatory Visit: Payer: Commercial Managed Care - HMO

## 2021-11-06 ENCOUNTER — Observation Stay (HOSPITAL_COMMUNITY): Payer: Medicare Other

## 2021-11-06 ENCOUNTER — Other Ambulatory Visit: Payer: Self-pay | Admitting: Family Medicine

## 2021-11-06 ENCOUNTER — Encounter (HOSPITAL_COMMUNITY): Payer: Self-pay | Admitting: Student

## 2021-11-06 DIAGNOSIS — E119 Type 2 diabetes mellitus without complications: Secondary | ICD-10-CM | POA: Diagnosis present

## 2021-11-06 DIAGNOSIS — E1169 Type 2 diabetes mellitus with other specified complication: Secondary | ICD-10-CM

## 2021-11-06 DIAGNOSIS — N12 Tubulo-interstitial nephritis, not specified as acute or chronic: Secondary | ICD-10-CM | POA: Diagnosis not present

## 2021-11-06 DIAGNOSIS — Z7984 Long term (current) use of oral hypoglycemic drugs: Secondary | ICD-10-CM | POA: Diagnosis not present

## 2021-11-06 DIAGNOSIS — Z794 Long term (current) use of insulin: Secondary | ICD-10-CM | POA: Diagnosis not present

## 2021-11-06 DIAGNOSIS — E876 Hypokalemia: Secondary | ICD-10-CM | POA: Diagnosis present

## 2021-11-06 DIAGNOSIS — N179 Acute kidney failure, unspecified: Secondary | ICD-10-CM | POA: Diagnosis not present

## 2021-11-06 DIAGNOSIS — R109 Unspecified abdominal pain: Secondary | ICD-10-CM | POA: Diagnosis not present

## 2021-11-06 DIAGNOSIS — N390 Urinary tract infection, site not specified: Secondary | ICD-10-CM | POA: Diagnosis present

## 2021-11-06 DIAGNOSIS — Z7901 Long term (current) use of anticoagulants: Secondary | ICD-10-CM | POA: Diagnosis not present

## 2021-11-06 DIAGNOSIS — R319 Hematuria, unspecified: Secondary | ICD-10-CM | POA: Diagnosis present

## 2021-11-06 DIAGNOSIS — Z6821 Body mass index (BMI) 21.0-21.9, adult: Secondary | ICD-10-CM | POA: Diagnosis not present

## 2021-11-06 DIAGNOSIS — Z20822 Contact with and (suspected) exposure to covid-19: Secondary | ICD-10-CM | POA: Diagnosis present

## 2021-11-06 DIAGNOSIS — R300 Dysuria: Secondary | ICD-10-CM

## 2021-11-06 DIAGNOSIS — Z7982 Long term (current) use of aspirin: Secondary | ICD-10-CM | POA: Diagnosis not present

## 2021-11-06 DIAGNOSIS — I110 Hypertensive heart disease with heart failure: Secondary | ICD-10-CM | POA: Diagnosis present

## 2021-11-06 DIAGNOSIS — D61818 Other pancytopenia: Secondary | ICD-10-CM | POA: Diagnosis present

## 2021-11-06 DIAGNOSIS — I951 Orthostatic hypotension: Secondary | ICD-10-CM | POA: Diagnosis present

## 2021-11-06 DIAGNOSIS — E43 Unspecified severe protein-calorie malnutrition: Secondary | ICD-10-CM | POA: Diagnosis present

## 2021-11-06 DIAGNOSIS — E860 Dehydration: Secondary | ICD-10-CM | POA: Diagnosis present

## 2021-11-06 DIAGNOSIS — I2510 Atherosclerotic heart disease of native coronary artery without angina pectoris: Secondary | ICD-10-CM | POA: Diagnosis present

## 2021-11-06 DIAGNOSIS — I4891 Unspecified atrial fibrillation: Secondary | ICD-10-CM | POA: Diagnosis present

## 2021-11-06 DIAGNOSIS — G4700 Insomnia, unspecified: Secondary | ICD-10-CM | POA: Diagnosis present

## 2021-11-06 DIAGNOSIS — K219 Gastro-esophageal reflux disease without esophagitis: Secondary | ICD-10-CM | POA: Diagnosis present

## 2021-11-06 DIAGNOSIS — N281 Cyst of kidney, acquired: Secondary | ICD-10-CM | POA: Diagnosis not present

## 2021-11-06 DIAGNOSIS — I5022 Chronic systolic (congestive) heart failure: Secondary | ICD-10-CM | POA: Diagnosis present

## 2021-11-06 DIAGNOSIS — I4510 Unspecified right bundle-branch block: Secondary | ICD-10-CM | POA: Diagnosis present

## 2021-11-06 DIAGNOSIS — B9620 Unspecified Escherichia coli [E. coli] as the cause of diseases classified elsewhere: Secondary | ICD-10-CM | POA: Diagnosis present

## 2021-11-06 DIAGNOSIS — N329 Bladder disorder, unspecified: Secondary | ICD-10-CM

## 2021-11-06 DIAGNOSIS — E7800 Pure hypercholesterolemia, unspecified: Secondary | ICD-10-CM | POA: Diagnosis present

## 2021-11-06 HISTORY — DX: Tubulo-interstitial nephritis, not specified as acute or chronic: N12

## 2021-11-06 LAB — BASIC METABOLIC PANEL
Anion gap: 14 (ref 5–15)
BUN: 19 mg/dL (ref 8–23)
CO2: 22 mmol/L (ref 22–32)
Calcium: 8.5 mg/dL — ABNORMAL LOW (ref 8.9–10.3)
Chloride: 99 mmol/L (ref 98–111)
Creatinine, Ser: 1.43 mg/dL — ABNORMAL HIGH (ref 0.44–1.00)
GFR, Estimated: 37 mL/min — ABNORMAL LOW (ref >60)
Glucose, Bld: 126 mg/dL — ABNORMAL HIGH (ref 70–99)
Potassium: 3.3 mmol/L — ABNORMAL LOW (ref 3.5–5.1)
Sodium: 135 mmol/L (ref 135–145)

## 2021-11-06 LAB — LIPID PANEL
Cholesterol: 109 mg/dL (ref 0–200)
HDL: 25 mg/dL — ABNORMAL LOW (ref >40)
LDL Cholesterol: 58 mg/dL (ref 0–99)
Total CHOL/HDL Ratio: 4.4 RATIO
Triglycerides: 131 mg/dL (ref <150)
VLDL: 26 mg/dL (ref 0–40)

## 2021-11-06 LAB — CBG MONITORING, ED: Glucose-Capillary: 81 mg/dL (ref 70–99)

## 2021-11-06 LAB — GLUCOSE, CAPILLARY
Glucose-Capillary: 77 mg/dL (ref 70–99)
Glucose-Capillary: 162 mg/dL — ABNORMAL HIGH (ref 70–99)
Glucose-Capillary: 169 mg/dL — ABNORMAL HIGH (ref 70–99)

## 2021-11-06 MED ORDER — POTASSIUM CHLORIDE CRYS ER 20 MEQ PO TBCR
40.0000 meq | EXTENDED_RELEASE_TABLET | Freq: Once | ORAL | Status: AC
  Administered 2021-11-06: 10:00:00 40 meq via ORAL
  Filled 2021-11-06: qty 2

## 2021-11-06 MED ORDER — INSULIN ASPART 100 UNIT/ML IJ SOLN
0.0000 [IU] | Freq: Three times a day (TID) | INTRAMUSCULAR | Status: DC
  Administered 2021-11-06: 17:00:00 2 [IU] via SUBCUTANEOUS
  Administered 2021-11-07: 1 [IU] via SUBCUTANEOUS
  Administered 2021-11-07 – 2021-11-08 (×2): 2 [IU] via SUBCUTANEOUS
  Administered 2021-11-08: 1 [IU] via SUBCUTANEOUS

## 2021-11-06 MED ORDER — POTASSIUM CHLORIDE CRYS ER 20 MEQ PO TBCR: 40.0000 meq | EXTENDED_RELEASE_TABLET | Freq: Two times a day (BID) | ORAL | Status: DC

## 2021-11-06 MED ORDER — APIXABAN 5 MG PO TABS
5.0000 mg | ORAL_TABLET | Freq: Two times a day (BID) | ORAL | Status: DC
  Administered 2021-11-06 – 2021-11-08 (×5): 5 mg via ORAL
  Filled 2021-11-06 (×5): qty 1

## 2021-11-06 NOTE — Progress Notes (Signed)
FPTS Brief Progress Note  S: Sleeping comfortably when I go by her room.  Did not disturb.   O: BP (!) 121/58 (BP Location: Left Arm)    Pulse 68    Temp 97.9 F (36.6 C) (Oral)    Resp 17    Ht 5\' 2"  (1.575 m)    Wt 53.1 kg    SpO2 93%    BMI 21.40 kg/m     A/P: Pyelonephritis with AKI. Vital signs are stable.  Continuing to monitor urine output.  Morning BMP MP ordered.  We will replete electrolytes as needed. - Continue ceftriaxone 1 g every 24 hours - Follow-up urine culture - Patient should be discontinued off of Jardiance prior to discharge - Outpatient urology referral placed  - Orders reviewed. Labs for AM ordered, which was adjusted as needed.    Gifford Shave, MD 11/06/2021, 11:30 PM PGY-3, [place] Night Resident  Please page [phone] with questions.

## 2021-11-06 NOTE — Evaluation (Signed)
Physical Therapy Evaluation Patient Details Name: Dana Coleman MRN: [id number] DOB: April 10, 1941 Today's Date: 11/06/2021  History of Present Illness  Pt is a 81 y/o female admitted secondary to weakness, fall and fatigue. Thought to be secondary to pyelonephritis.  PMH is significant for HFrEF, CAD, PE, GERD, T2DM, HLD, HTN, depression, empyema following cholecystectomy, esophageal perforation s/p repair, acute renal failure requiring CRRT, A. fib, G-tube, breast cancer history.  Clinical Impression  Pt admitted secondary to problem above with deficits below. Pt requiring min guard A for transfers and taking side steps at EOB. Pt reports feeling weak and dizzy.  Pt's BP in sitting at 123/69 and BP in standing at 103/69. Anticipate pt will progress well as symptoms improve and will be able to d/c home with [facility] services with family support. However, if pt does not progress well and family not able to provide assist, may need to consider SNF level therapies. Will continue to follow acutely.      Recommendations for follow up therapy are one component of a multi-disciplinary discharge planning process, led by the attending physician.  Recommendations may be updated based on patient status, additional functional criteria and insurance authorization.  Follow Up Recommendations Home health PT (pending progression)    Assistance Recommended at Discharge Frequent or constant Supervision/Assistance (Initially)  Patient can return home with the following  A little help with walking and/or transfers;A little help with bathing/dressing/bathroom    Equipment Recommendations None recommended by PT  Recommendations for Other Services       Functional Status Assessment Patient has had a recent decline in their functional status and demonstrates the ability to make significant improvements in function in a reasonable and predictable amount of time.     Precautions / Restrictions Precautions Precautions:  Fall Precaution Comments: watch BP Restrictions Weight Bearing Restrictions: No      Mobility  Bed Mobility Overal bed mobility: Needs Assistance Bed Mobility: Supine to Sit, Sit to Supine     Supine to sit: Min assist Sit to supine: Supervision   General bed mobility comments: MinA for trunk assist to come to sitting.    Transfers Overall transfer level: Needs assistance Equipment used: 1 person hand held assist Transfers: Sit to/from Stand Sit to Stand: Min guard           General transfer comment: Min guard for safety. Pt reporting increased dizziness and weakness, but was able to stand to be changed and could take a few side steps for repositioning with min guard A.    Ambulation/Gait                  Stairs            Wheelchair Mobility    Modified Rankin (Stroke Patients Only)       Balance Overall balance assessment: Needs assistance Sitting-balance support: No upper extremity supported, Feet supported Sitting balance-Leahy Scale: Fair     Standing balance support: Single extremity supported Standing balance-Leahy Scale: Poor Standing balance comment: REliant on at least on UE support                             Pertinent Vitals/Pain Pain Assessment Pain Assessment: No/denies pain    Home Living Family/patient expects to be discharged to:: Private residence Living Arrangements: Alone Available Help at Discharge: Family Type of Home: House Home Access: Stairs to enter Entrance Stairs-Rails: Left Entrance Stairs-Number of Steps: 3  Home Layout: One level Home Equipment: Conservation officer, nature (2 wheels);Cane - single point;Shower seat;BSC/3in1 Additional Comments: has been taking basin baths    Prior Function Prior Level of Function : Independent/Modified Independent             Mobility Comments: using RW vs cane for mobility ADLs Comments: independent with ADLs, assist from family IADLs, assist with meds from  RN     Hand Dominance        Extremity/Trunk Assessment   Upper Extremity Assessment Upper Extremity Assessment: Defer to OT evaluation    Lower Extremity Assessment Lower Extremity Assessment: Generalized weakness    Cervical / Trunk Assessment Cervical / Trunk Assessment: Kyphotic  Communication   Communication: No difficulties  Cognition Arousal/Alertness: Awake/alert Behavior During Therapy: WFL for tasks assessed/performed Overall Cognitive Status: Within Functional Limits for tasks assessed                                          General Comments General comments (skin integrity, edema, etc.): Pt's BP in sitting at 123/69 and BP in standing at 103/69    Exercises     Assessment/Plan    PT Assessment Patient needs continued PT services  PT Problem List Decreased strength;Decreased activity tolerance;Decreased balance;Decreased mobility;Decreased knowledge of use of DME;Decreased knowledge of precautions       PT Treatment Interventions DME instruction;Gait training;Stair training;Functional mobility training;Therapeutic activities;Therapeutic exercise;Balance training;Patient/family education    PT Goals (Current goals can be found in the Care Plan section)  Acute Rehab PT Goals Patient Stated Goal: to feel better PT Goal Formulation: With patient Time For Goal Achievement: 11/20/21 Potential to Achieve Goals: Fair    Frequency Min 3X/week     Co-evaluation               AM-PAC PT "6 Clicks" Mobility  Outcome Measure Help needed turning from your back to your side while in a flat bed without using bedrails?: A Little Help needed moving from lying on your back to sitting on the side of a flat bed without using bedrails?: A Little Help needed moving to and from a bed to a chair (including a wheelchair)?: A Little Help needed standing up from a chair using your arms (e.g., wheelchair or bedside chair)?: A Little Help needed to  walk in hospital room?: A Little Help needed climbing 3-5 steps with a railing? : A Lot 6 Click Score: 17    End of Session Equipment Utilized During Treatment: Gait belt Activity Tolerance: Patient limited by fatigue Patient left: in bed;with call bell/phone within reach (on stretcher in ED) Nurse Communication: Mobility status PT Visit Diagnosis: Unsteadiness on feet (R26.81);Muscle weakness (generalized) (M62.81)    Time: 9702-6378 PT Time Calculation (min) (ACUTE ONLY): 12 min   Charges:   PT Evaluation $PT Eval Moderate Complexity: 1 Mod          Reuel Derby, PT, DPT  Acute Rehabilitation Services  Pager: [phone] Office: [phone]   Rudean Hitt 11/06/2021, 11:55 AM

## 2021-11-06 NOTE — Evaluation (Addendum)
Occupational Therapy Evaluation Patient Details Name: Dana Coleman MRN: [id number] DOB: [birth year] Today's Date: 11/06/2021   History of Present Illness Pt is a 81 y/o female admitted secondary to weakness, fall and fatigue. Thought to be secondary to pyelonephritis.  PMH is significant for HFrEF, CAD, PE, GERD, T2DM, HLD, HTN, depression, empyema following cholecystectomy, esophageal perforation s/p repair, acute renal failure requiring CRRT, A. fib, G-tube, breast cancer history.   Clinical Impression   PTA patient independent using RW/cane for mobility, ADLs and limited iADLs.  Admitted for above and limited by problem list below, including generalized weakness, impaired balance, decreased activity tolerance and incontinence.  Patient demonstrates ability to complete transfers with up to min assist, LB ADLs with mod assist and toileting with total assist.  Pt with dizziness standing at EOB, with notable BP drop from 123/69 to 103/69. She reports family can assist initially at dc as needed.  Believe she will benefit from continued OT services acutely and after dc at [facility] level to optimize independence and safety with ADLs, mobility.  Will follow.      Recommendations for follow up therapy are one component of a multi-disciplinary discharge planning process, led by the attending physician.  Recommendations may be updated based on patient status, additional functional criteria and insurance authorization.   Follow Up Recommendations  Home health OT    Assistance Recommended at Discharge Frequent or constant Supervision/Assistance  Patient can return home with the following A little help with walking and/or transfers;A little help with bathing/dressing/bathroom;Assistance with cooking/housework;Assist for transportation;Help with stairs or ramp for entrance    Functional Status Assessment  Patient has had a recent decline in their functional status and demonstrates the ability to make  significant improvements in function in a reasonable and predictable amount of time.  Equipment Recommendations  None recommended by OT    Recommendations for Other Services       Precautions / Restrictions Precautions Precautions: Fall Precaution Comments: watch BP Restrictions Weight Bearing Restrictions: No      Mobility Bed Mobility Overal bed mobility: Needs Assistance Bed Mobility: Supine to Sit, Sit to Supine     Supine to sit: Min assist Sit to supine: Supervision   General bed mobility comments: MinA for trunk assist to come to sitting.    Transfers Overall transfer level: Needs assistance Equipment used: 1 person hand held assist Transfers: Sit to/from Stand Sit to Stand: Min assist           General transfer comment: min assist sit to stand, dizziness upon standing      Balance Overall balance assessment: Needs assistance Sitting-balance support: No upper extremity supported, Feet supported Sitting balance-Leahy Scale: Fair     Standing balance support: Single extremity supported, During functional activity Standing balance-Leahy Scale: Poor Standing balance comment: REliant on at least on UE support                           ADL either performed or assessed with clinical judgement   ADL Overall ADL's : Needs assistance/impaired     Grooming: Set up;Sitting           Upper Body Dressing : Set up;Sitting   Lower Body Dressing: Moderate assistance;Sit to/from stand   Toilet Transfer: Minimal assistance;Ambulation Toilet Transfer Details (indicate cue type and reason): side stepping simulated in room Toileting- Clothing Manipulation and Hygiene: Total assistance;+2 for safety/equipment;Sit to/from stand       Functional mobility during  ADLs: Minimal assistance       Vision         Perception     Praxis      Pertinent Vitals/Pain Pain Assessment Pain Assessment: No/denies pain     Hand Dominance Right    Extremity/Trunk Assessment Upper Extremity Assessment Upper Extremity Assessment: Generalized weakness   Lower Extremity Assessment Lower Extremity Assessment: Defer to PT evaluation   Cervical / Trunk Assessment Cervical / Trunk Assessment: Kyphotic   Communication Communication Communication: No difficulties   Cognition Arousal/Alertness: Awake/alert Behavior During Therapy: WFL for tasks assessed/performed Overall Cognitive Status: Within Functional Limits for tasks assessed                                       General Comments  Pt's BP in sitting at 123/69 and BP in standing at 103/69    Exercises     Shoulder Instructions      Home Living Family/patient expects to be discharged to:: Private residence Living Arrangements: Alone Available Help at Discharge: Family Type of Home: House Home Access: Stairs to enter Technical brewer of Steps: 3 Entrance Stairs-Rails: Left Home Layout: One level     Bathroom Shower/Tub: Teacher, early years/pre: Standard Bathroom Accessibility: No   Home Equipment: Conservation officer, nature (2 wheels);Cane - single point;Shower seat;BSC/3in1   Additional Comments: has been taking basin baths      Prior Functioning/Environment Prior Level of Function : Independent/Modified Independent             Mobility Comments: using RW vs cane for mobility ADLs Comments: independent with ADLs, assist from family IADLs, assist with meds from RN        OT Problem List: Decreased strength;Decreased activity tolerance;Impaired balance (sitting and/or standing);Decreased safety awareness;Decreased knowledge of use of DME or AE;Decreased knowledge of precautions      OT Treatment/Interventions: Self-care/ADL training;Therapeutic exercise;DME and/or AE instruction;Therapeutic activities;Patient/family education;Balance training;Energy conservation    OT Goals(Current goals can be found in the care plan section) Acute  Rehab OT Goals Patient Stated Goal: feel better OT Goal Formulation: With patient Time For Goal Achievement: 11/20/21 Potential to Achieve Goals: Good  OT Frequency: Min 2X/week    Co-evaluation              AM-PAC OT "6 Clicks" Daily Activity     Outcome Measure Help from another person eating meals?: A Little Help from another person taking care of personal grooming?: A Little Help from another person toileting, which includes using toliet, bedpan, or urinal?: Total Help from another person bathing (including washing, rinsing, drying)?: A Little Help from another person to put on and taking off regular upper body clothing?: A Little Help from another person to put on and taking off regular lower body clothing?: A Little 6 Click Score: 16   End of Session Equipment Utilized During Treatment: Gait belt Nurse Communication: Mobility status  Activity Tolerance: Patient tolerated treatment well Patient left: in bed;with call bell/phone within reach  OT Visit Diagnosis: Other abnormalities of gait and mobility (R26.89);Muscle weakness (generalized) (M62.81)                Time: 5462-7035 OT Time Calculation (min): 22 min Charges:  OT General Charges $OT Visit: 1 Visit OT Evaluation $OT Eval Moderate Complexity: 1 Mod  [name], OT Acute Rehabilitation Services Pager [phone] Office [phone]   [name] 11/06/2021,  1:42 PM

## 2021-11-06 NOTE — Progress Notes (Signed)
Family Medicine Teaching Service Daily Progress Note Intern Pager: [phone]  Patient name: Dana Coleman Medical record number: [id number] Date of birth: [birth year] Age: 81 y.o. Gender: female  Primary Care Provider: Gifford Shave, MD Consultants: None Code Status: Full  Pt Overview and Major Events to Date:  1/24 admitted  Assessment and Plan:  [name] is a 81 year old female presenting with hematuria.  Past medical history significant for HFrEF, T2DM, insomnia, A. fib with RVR, HLD, HTN, CAD and GERD  Hematuria with concern for Pyelonephritis/?Bladder Mass This morning patient's abdomen was , continues to have positive right CVA tenderness, has remained afebrile.  Renal ultrasound showed 5.6 cm complex multi septated cystic abnormality is noted in upper pole left kidney. Follow-up ultrasound in 6-12 months is recommended to ensure stability and rule out neoplasm. Multiple other bilateral simple renal cysts are noted. Abnormal solid abnormality seen posteriorly in the urinary bladder which may represent layering debris, although possible mass cannot be excluded. Cystoscopy is recommended for further evaluation. Has had weight loss noted -PT/OT -MIVF -Urology outpatient referral -Ceftriaxone 1 g every 24 hours, transition to oral after urine culture -Follow-up urine culture -Discontinue Jardiance prior to discharge  Fall with weakness Had previously reported being dizzy prior to fall without loss of consciousness.  Negative imaging in the ED.  Does have a history of A. fib.  Could possibly be from dizziness from dehydration or antihypertensives at home. -Cardiac monitoring -Fall precautions -MIVF 125 mill LR for 12 more hours -PT/OT for orthostatics  AKI Potentially from dehydration or continued antihypertensive use.  Patient's creatinine on admission was 1.7, improved to 1.43 today with a baseline around 0.7.  On exam patient appears to have some dry mucous  membranes. -Monitor on BMP -Avoid nephrotoxic agents -Hold Entresto -MIVF for 12 more hours  HFrEF Last echo August 2022 with EF 25 to 30%.  On exam today patient appears fluid down. -Hold Lasix and Entresto -Daily weights -Strict I's and O's added this morning  History of A. fib with RVR Hx of PE  HR 50s to 60s. -Continue home medication of amiodarone 100 mg daily -Eliquis 5 mg twice daily -Metoprolol 25 mg twice daily -Cardiac monitoring -Monitor electrolytes  T2DM Blood glucose ranges have been 126-156.  -sSSI -CBG monitoring -f/u A1c -Discontinue Jardiance at discharge  HLD Lipid panel showed cholesterol 109.  Triglycerides 131. LDL of 58. -Continue home rosuvastatin 20 mg  HTN Blood pressures have been 97-152/51-76.  Has been softer this morning. -Hold home blood pressure medications -Monitor vitals  Hypokalemia Potassium this morning was 3.3 -Repleted 40 mEq packet -Monitor on BMP  Protein calorie malnutrition PEG tube removed in December, site looks appropriate and losing weight. -Consider RD consult  Chronic/stable Insomnia-trazodone 50 mg as needed GERD-omeprazole 40 mg CAD-aspirin and Nitrostat  FEN/GI: Heart healthy/carb modified PPx: Eliquis 5 mg twice daily Dispo: Home pending clinical condition  Subjective:  No acute concerns or issues overnight  Objective: Temp:  [97.6 F (36.4 C)-98.7 F (37.1 C)] 97.6 F (36.4 C) (01/25 0355) Pulse Rate:  [50-78] 60 (01/25 0600) Resp:  [10-22] 20 (01/25 0600) BP: (97-152)/(51-76) 97/60 (01/25 0600) SpO2:  [94 %-100 %] 98 % (01/25 0600) Weight:  [53.1 kg] 53.1 kg (01/25 0355) Physical Exam: General: NAD, laying in bed comfortable, responsive to questions Cardiovascular: no murmurs rubs or gallops Respiratory: CTAB no w/r/c, no iWOB Abdomen: soft, some right CVA tenderness mild, normoactive bowel sounds, no distension, no suprapubic tenderness Extremities: no LE edema  Laboratory: Recent Labs   Lab 11/05/21 1328  WBC 7.7  HGB 12.5  HCT 38.9  PLT 137*   Recent Labs  Lab 11/05/21 1328 11/06/21 0326  NA 137 135  K 3.9 3.3*  CL 100 99  CO2 22 22  BUN 19 19  CREATININE 1.70* 1.43*  CALCIUM 9.0 8.5*  PROT 7.4  --   BILITOT 0.6  --   ALKPHOS 67  --   ALT 16  --   AST 27  --   GLUCOSE 156* 126*      Imaging/Diagnostic Tests: DG Chest 1 View  Result Date: 11/05/2021 CLINICAL DATA:  Shortness of breath EXAM: CHEST  1 VIEW COMPARISON:  07/22/2021 FINDINGS: The heart size and mediastinal contours are within normal limits. Both lungs are clear. The visualized skeletal structures are unremarkable. IMPRESSION: No active disease. Electronically Signed   By: Kathreen Devoid M.D.   On: 11/05/2021 13:59   CT Head Wo Contrast  Result Date: 11/05/2021 CLINICAL DATA:  Head trauma, minor (Age >= 65y); Neck trauma (Age >= 65y) EXAM: CT HEAD WITHOUT CONTRAST CT CERVICAL SPINE WITHOUT CONTRAST TECHNIQUE: Multidetector CT imaging of the head and cervical spine was performed following the standard protocol without intravenous contrast. Multiplanar CT image reconstructions of the cervical spine were also generated. RADIATION DOSE REDUCTION: This exam was performed according to the departmental dose-optimization program which includes automated exposure control, adjustment of the mA and/or kV according to patient size and/or use of iterative reconstruction technique. COMPARISON:  05/04/2021 FINDINGS: CT HEAD FINDINGS Brain: No evidence of acute infarction, hemorrhage, hydrocephalus, extra-axial collection or mass lesion/mass effect. Mild-moderate low-density changes within the periventricular and subcortical white matter are similar to prior and most compatible with chronic microvascular ischemic change. Mild diffuse cerebral volume loss. Vascular: Atherosclerotic calcifications involving the large vessels of the skull base. No unexpected hyperdense vessel. Skull: Normal. Negative for fracture or focal  lesion. Sinuses/Orbits: No acute finding. Other: None. CT CERVICAL SPINE FINDINGS Alignment: Facet joints are aligned without dislocation or traumatic listhesis. Dens and lateral masses are aligned. Skull base and vertebrae: No acute fracture. No primary bone lesion or focal pathologic process. Soft tissues and spinal canal: No prevertebral fluid or swelling. No visible canal hematoma. Disc levels: Mild degenerative disc disease of C3-4 through C5-6. Mild-to-moderate facet arthropathy is more pronounced on the left. Upper chest: Included lung apices are clear. Other: Postsurgical changes to the thyroid gland. IMPRESSION: 1. No acute intracranial abnormality. 2. No acute cervical spine fracture or subluxation. 3. Chronic microvascular ischemic change and cerebral volume loss. 4. Mild multilevel cervical spondylosis. Electronically Signed   By: Davina Poke D.O.   On: 11/05/2021 14:51   CT Cervical Spine Wo Contrast  Result Date: 11/05/2021 CLINICAL DATA:  Head trauma, minor (Age >= 65y); Neck trauma (Age >= 65y) EXAM: CT HEAD WITHOUT CONTRAST CT CERVICAL SPINE WITHOUT CONTRAST TECHNIQUE: Multidetector CT imaging of the head and cervical spine was performed following the standard protocol without intravenous contrast. Multiplanar CT image reconstructions of the cervical spine were also generated. RADIATION DOSE REDUCTION: This exam was performed according to the departmental dose-optimization program which includes automated exposure control, adjustment of the mA and/or kV according to patient size and/or use of iterative reconstruction technique. COMPARISON:  05/04/2021 FINDINGS: CT HEAD FINDINGS Brain: No evidence of acute infarction, hemorrhage, hydrocephalus, extra-axial collection or mass lesion/mass effect. Mild-moderate low-density changes within the periventricular and subcortical white matter are similar to prior and most compatible with chronic microvascular  ischemic change. Mild diffuse cerebral  volume loss. Vascular: Atherosclerotic calcifications involving the large vessels of the skull base. No unexpected hyperdense vessel. Skull: Normal. Negative for fracture or focal lesion. Sinuses/Orbits: No acute finding. Other: None. CT CERVICAL SPINE FINDINGS Alignment: Facet joints are aligned without dislocation or traumatic listhesis. Dens and lateral masses are aligned. Skull base and vertebrae: No acute fracture. No primary bone lesion or focal pathologic process. Soft tissues and spinal canal: No prevertebral fluid or swelling. No visible canal hematoma. Disc levels: Mild degenerative disc disease of C3-4 through C5-6. Mild-to-moderate facet arthropathy is more pronounced on the left. Upper chest: Included lung apices are clear. Other: Postsurgical changes to the thyroid gland. IMPRESSION: 1. No acute intracranial abnormality. 2. No acute cervical spine fracture or subluxation. 3. Chronic microvascular ischemic change and cerebral volume loss. 4. Mild multilevel cervical spondylosis. Electronically Signed   By: Davina Poke D.O.   On: 11/05/2021 14:51   US RENAL  Result Date: 11/06/2021 CLINICAL DATA:  Bilateral flank pain for several months. EXAM: RENAL / URINARY TRACT ULTRASOUND COMPLETE COMPARISON:  July 22, 2021. FINDINGS: Right Kidney: Renal measurements: 12.0 x 6.0 x 5.6 cm = volume: 211 mL. Two simple cysts are noted, the largest measuring 6.6 cm medially. Echogenicity within normal limits. No mass or hydronephrosis visualized. Left Kidney: Renal measurements: 8.7 x 5.5 x 5.4 cm = volume: 134 mL. 2.5 cm simple cyst is noted laterally. Complex multi septated cyst measuring 5.6 cm is noted in the upper pole. Echogenicity within normal limits. No mass or hydronephrosis visualized. Bladder: There is abnormal solid abnormality posteriorly in the urinary bladder which may represent layering debris, although possible mass cannot be excluded. It measures 5.7 x 2.6 x 1.2 cm. Other: None. IMPRESSION:  5.6 cm complex multi septated cystic abnormality is noted in upper pole left kidney. Follow-up ultrasound in 6-12 months is recommended to ensure stability and rule out neoplasm. Multiple other bilateral simple renal cysts are noted. Abnormal solid abnormality seen posteriorly in the urinary bladder which may represent layering debris, although possible mass cannot be excluded. Cystoscopy is recommended for further evaluation. Electronically Signed   By: Marijo Conception M.D.   On: 11/06/2021 08:39     Gerrit Heck, MD 11/06/2021, 7:14 AM PGY-1, [place] Intern pager: [phone], text pages welcome

## 2021-11-06 NOTE — Progress Notes (Signed)
Ambulatory to urology placed due to findings while hospitalized.

## 2021-11-06 NOTE — ED Notes (Signed)
Pt provided with lunch bag and water

## 2021-11-07 LAB — BASIC METABOLIC PANEL
Anion gap: 11 (ref 5–15)
BUN: 14 mg/dL (ref 8–23)
CO2: 24 mmol/L (ref 22–32)
Calcium: 8.5 mg/dL — ABNORMAL LOW (ref 8.9–10.3)
Chloride: 104 mmol/L (ref 98–111)
Creatinine, Ser: 1.38 mg/dL — ABNORMAL HIGH (ref 0.44–1.00)
GFR, Estimated: 39 mL/min — ABNORMAL LOW (ref >60)
Glucose, Bld: 152 mg/dL — ABNORMAL HIGH (ref 70–99)
Potassium: 3.3 mmol/L — ABNORMAL LOW (ref 3.5–5.1)
Sodium: 139 mmol/L (ref 135–145)

## 2021-11-07 LAB — GLUCOSE, CAPILLARY
Glucose-Capillary: 114 mg/dL — ABNORMAL HIGH (ref 70–99)
Glucose-Capillary: 197 mg/dL — ABNORMAL HIGH (ref 70–99)
Glucose-Capillary: 125 mg/dL — ABNORMAL HIGH (ref 70–99)
Glucose-Capillary: 141 mg/dL — ABNORMAL HIGH (ref 70–99)

## 2021-11-07 LAB — CBC
HCT: 31.7 % — ABNORMAL LOW (ref 36.0–46.0)
Hemoglobin: 10.3 g/dL — ABNORMAL LOW (ref 12.0–15.0)
MCH: 28.5 pg (ref 26.0–34.0)
MCHC: 32.5 g/dL (ref 30.0–36.0)
MCV: 87.8 fL (ref 80.0–100.0)
Platelets: 116 10*3/uL — ABNORMAL LOW (ref 150–400)
RBC: 3.61 MIL/uL — ABNORMAL LOW (ref 3.87–5.11)
RDW: 18.8 % — ABNORMAL HIGH (ref 11.5–15.5)
WBC: 5 10*3/uL (ref 4.0–10.5)
nRBC: 0 % (ref 0.0–0.2)

## 2021-11-07 MED ORDER — POTASSIUM CHLORIDE CRYS ER 20 MEQ PO TBCR
40.0000 meq | EXTENDED_RELEASE_TABLET | Freq: Once | ORAL | Status: AC
  Administered 2021-11-07: 40 meq via ORAL
  Filled 2021-11-07: qty 2

## 2021-11-07 NOTE — Progress Notes (Signed)
°  Transition of Care [facility]) Screening Note   Patient Details  Name: [name] Date of Birth: June 10, 1941   Transition of Care [facility]) CM/SW Contact:    Cyndi Bender, RN Phone Number: 11/07/2021, 9:31 AM    Transition of Care Department [facility]) has reviewed patient and no TOC needs have been identified at this time. We will continue to monitor patient advancement through interdisciplinary progression rounds. If new patient transition needs arise, please place a TOC consult.

## 2021-11-07 NOTE — Progress Notes (Signed)
Physical Therapy Treatment Patient Details Name: Dana Coleman MRN: [id number] DOB: Nov 29, 1940 Today's Date: 11/07/2021   History of Present Illness Pt is a 81 y/o female admitted secondary to weakness, fall and fatigue. Thought to be secondary to pyelonephritis.  PMH is significant for HFrEF, CAD, PE, GERD, T2DM, HLD, HTN, depression, empyema following cholecystectomy, esophageal perforation s/p repair, acute renal failure requiring CRRT, A. fib, G-tube, breast cancer history.    PT Comments    Pt making good progress with mobility and able to ambulate in hallway today without dizziness. Continue to follow and recommend return home with HHPT.    Recommendations for follow up therapy are one component of a multi-disciplinary discharge planning process, led by the attending physician.  Recommendations may be updated based on patient status, additional functional criteria and insurance authorization.  Follow Up Recommendations  Home health PT     Assistance Recommended at Discharge Intermittent Supervision/Assistance  Patient can return home with the following A little help with walking and/or transfers;A little help with bathing/dressing/bathroom   Equipment Recommendations  None recommended by PT    Recommendations for Other Services       Precautions / Restrictions Precautions Precautions: Fall Precaution Comments: watch BP Restrictions Weight Bearing Restrictions: No     Mobility  Bed Mobility Overal bed mobility: Needs Assistance Bed Mobility: Supine to Sit     Supine to sit: Supervision, HOB elevated     General bed mobility comments: Incr time to perform    Transfers Overall transfer level: Needs assistance Equipment used: Rolling walker (2 wheels) Transfers: Sit to/from Stand Sit to Stand: Min guard           General transfer comment: Assist for safety and lines    Ambulation/Gait Ambulation/Gait assistance: Min guard Gait Distance (Feet): 120  Feet Assistive device: Rolling walker (2 wheels) Gait Pattern/deviations: Step-through pattern, Decreased stride length Gait velocity: decr Gait velocity interpretation: 1.31 - 2.62 ft/sec, indicative of limited community ambulator   General Gait Details: assist for safety and lines   Stairs             Wheelchair Mobility    Modified Rankin (Stroke Patients Only)       Balance Overall balance assessment: Needs assistance Sitting-balance support: No upper extremity supported, Feet supported Sitting balance-Leahy Scale: Fair     Standing balance support: Single extremity supported Standing balance-Leahy Scale: Poor Standing balance comment: walker and supervision for static standing                            Cognition Arousal/Alertness: Awake/alert Behavior During Therapy: WFL for tasks assessed/performed Overall Cognitive Status: Within Functional Limits for tasks assessed                                          Exercises      General Comments General comments (skin integrity, edema, etc.): VSS on RA. Only mild dizziness on initial sitting with BP 110's/60's.      Pertinent Vitals/Pain Pain Assessment Pain Assessment: No/denies pain    Home Living                          Prior Function            PT Goals (current goals can now be found in the  care plan section) Acute Rehab PT Goals Patient Stated Goal: to feel better Progress towards PT goals: Progressing toward goals    Frequency    Min 3X/week      PT Plan Current plan remains appropriate    Co-evaluation              AM-PAC PT "6 Clicks" Mobility   Outcome Measure  Help needed turning from your back to your side while in a flat bed without using bedrails?: A Little Help needed moving from lying on your back to sitting on the side of a flat bed without using bedrails?: A Little Help needed moving to and from a bed to a chair (including  a wheelchair)?: A Little Help needed standing up from a chair using your arms (e.g., wheelchair or bedside chair)?: A Little Help needed to walk in hospital room?: A Little Help needed climbing 3-5 steps with a railing? : A Little 6 Click Score: 18    End of Session Equipment Utilized During Treatment: Gait belt Activity Tolerance: Patient tolerated treatment well Patient left: with call bell/phone within reach;in chair;with chair alarm set;with nursing/sitter in room Nurse Communication: Mobility status PT Visit Diagnosis: Unsteadiness on feet (R26.81);Muscle weakness (generalized) (M62.81)     Time: 6004-5997 PT Time Calculation (min) (ACUTE ONLY): 16 min  Charges:  $Gait Training: 8-22 mins                     [place] Pager [phone] Office [place] 11/07/2021, 12:58 PM

## 2021-11-07 NOTE — Progress Notes (Signed)
Family Medicine Teaching Service Daily Progress Note Intern Pager: [phone]  Patient name: Dana Coleman Medical record number: [id number] Date of birth: 08-05-1941 Age: 81 y.o. Gender: female  Primary Care Provider: Gifford Shave, MD Consultants: None Code Status: Full  Pt Overview and Major Events to Date:  1/24 admitted 1/25 renal ultrasound showed renal cyst and possible mass behind urinary bladder  Assessment and Plan:  Dana Coleman is a 81 year old female presenting with hematuria.  Past medical history significant for HFrEF, T2DM, insomnia, A. fib with RVR, HLD, HTN, CAD and GERD.  Hematuria likely 2/2 to possible bladder mass vs. Pyelonephritis Patient's abdomen this morning was soft and nontender patient has remained afebrile.  PCP has put in referral for urology outpatient follow-up per patient yesterday given possible bladder mass.  WBC of 5.0.  Patient denies any dysuria. -PT/OT -Encourage p.o. -Urology outpatient referral -S/p 2 doses of ceftriaxone, continue  -Follow-up urine culture -Discontinue Jardiance prior to discharge  Fall with weakness Has reported being dizzy before fall without loss of consciousness.  Did have negative imaging in the ED.  Has history of A. fib but likely secondary to dehydration/antihypertensive use at home. -Orthostatic vitals -Fall precautions -PT/OT  AKI likely prerenal This morning was 1.38 from 1.43 yesterday. -Monitor BMP -Avoid nephrotoxic agents -Hold Entresto  HFrEF Last echo August 2022 with EF 25 to 30%.  UOP recorded for 450 mL (0.7 ml/kg/hr) in last 24 hours with 2 unmeasured urine output.  Up 2.6 L since admission.  On exam did not appear fluid overloaded.  -Monitor fluid status -Daily weights -Strict I's and O's  A. fib   history of PE Heart rates have been 60s to 80s. -Amiodarone 100 mg daily -Eliquis 5 mg twice daily -Metoprolol 25 mg twice daily -Monitor electrolytes  Hypokalemia  K this morning  was 3.3 -Replete 40 mEq K  HTN Blood pressures have been 107-151/52-71 -Hold home antihypertensives -Monitor vitals  Chronic/stable HLD-rosuvastatin 20 Protein calorie malnutrition-consider RD consult Insomnia- trazadone prn GERD-omeprazole CAD-aspirin and nitrostat  FEN/GI: Heart Healthy/Carb Modified 1200 ml fluid restriciton PPx: Eliquis Dispo:Home with HHPT pending urine culture   Subjective:  Patient denies any dysuria  Objective: Temp:  [97.9 F (36.6 C)-98.9 F (37.2 C)] 98.6 F (37 C) (01/26 0400) Pulse Rate:  [61-83] 83 (01/26 0400) Resp:  [11-20] 20 (01/26 0400) BP: (107-151)/(52-71) 107/53 (01/26 0400) SpO2:  [91 %-100 %] 91 % (01/26 0400) Weight:  [55.5 kg] 55.5 kg (01/26 0400) Physical Exam: General: NAD, laying in bed comfortably, non-toxic Cardiovascular: RRR no m/r/g 2+ pulses Respiratory: CTAB no w/r/c, no iWOB Abdomen: R CVA tenderness, soft, nontender to palpation, nondistender, no suprapubic tenderness Extremities: No LE edema  Laboratory: Recent Labs  Lab 11/05/21 1328 11/07/21 0050  WBC 7.7 5.0  HGB 12.5 10.3*  HCT 38.9 31.7*  PLT 137* 116*   Recent Labs  Lab 11/05/21 1328 11/06/21 0326 11/07/21 0050  NA 137 135 139  K 3.9 3.3* 3.3*  CL 100 99 104  CO2 22 22 24   BUN 19 19 14   CREATININE 1.70* 1.43* 1.38*  CALCIUM 9.0 8.5* 8.5*  PROT 7.4  --   --   BILITOT 0.6  --   --   ALKPHOS 67  --   --   ALT 16  --   --   AST 27  --   --   GLUCOSE 156* 126* 152*      Imaging/Diagnostic Tests:   Gerrit Heck, MD 11/07/2021, 8:04 AM  PGY-1, [place] Intern pager: [phone], text pages welcome

## 2021-11-07 NOTE — TOC Initial Note (Signed)
Transition of Care [facility]) - Initial/Assessment Note    Patient Details  Name: Dana Coleman MRN: [id number] Date of Birth: 12-Mar-1941  Transition of Care [facility]) CM/SW Contact:    Cyndi Bender, RN Phone Number: 11/07/2021, 4:19 PM  Clinical Narrative:          Spoke to patient regarding transition needs.        She lives alone and her sister and brother help take her to apts. She has a walker and 3&!. She is active with [facility] for HH-PT/OT/RN.  Notified MD that need resumption orders and to add SW. Corene Cornea with [facility] accepted referral.  TOC will continue to follow   Expected Discharge Plan: [place] Barriers to Discharge: Continued Medical Work up   Patient Goals and CMS Choice Patient states their goals for this hospitalization and ongoing recovery are:: return home CMS Medicare.gov Compare Post Acute Care list provided to:: Patient Choice offered to / list presented to : Patient  Expected Discharge Plan and Services Expected Discharge Plan: [place]   Discharge Planning Services: CM Consult Post Acute Care Choice: Durable Medical Equipment, Home Health Living arrangements for the past 2 months: Single Family Home                           HH Arranged: PT, OT, RN, Social Work [employer] Agency: [place]) Date [place]: 11/07/21 Time [place]: 1618 Representative spoke with at [place]: Corene Cornea  Prior Living Arrangements/Services Living arrangements for the past 2 months: [place] Lives with:: Self Patient language and need for interpreter reviewed:: Yes Do you feel safe going back to the place where you live?: Yes      Need for Family Participation in Patient Care: Yes (Comment) Care giver support system in place?: Yes (comment) Current home services: DME, Home PT, Home RN, Home OT (walker, BSC) Criminal Activity/Legal Involvement Pertinent to Current  Situation/Hospitalization: No - Comment as needed  Activities of Daily Living Home Assistive Devices/Equipment: None ADL Screening (condition at time of admission) Patient's cognitive ability adequate to safely complete daily activities?: Yes Is the patient deaf or have difficulty hearing?: No Does the patient have difficulty seeing, even when wearing glasses/contacts?: No Does the patient have difficulty concentrating, remembering, or making decisions?: No Patient able to express need for assistance with ADLs?: Yes Does the patient have difficulty dressing or bathing?: No Independently performs ADLs?: Yes (appropriate for developmental age) Does the patient have difficulty walking or climbing stairs?: No Weakness of Legs: None Weakness of Arms/Hands: None  Permission Sought/Granted Permission sought to share information with : Case Manager Permission granted to share information with : Yes, Verbal Permission Granted     Permission granted to share info w AGENCY: home health        Emotional Assessment Appearance:: Appears stated age Attitude/Demeanor/Rapport: Engaged Affect (typically observed): Accepting Orientation: : Oriented to Self, Oriented to Place, Oriented to  Time, Oriented to Situation Alcohol / Substance Use: Not Applicable Psych Involvement: No (comment)  Admission diagnosis:  UTI (urinary tract infection) [N39.0] Flank pain [R10.9] Hematuria, unspecified type [R31.9] Hematuria [R31.9] Patient Active Problem List   Diagnosis Date Noted   Pyelonephritis 11/06/2021   Hematuria 11/06/2021   UTI (urinary tract infection) 11/05/2021   Dysuria 10/25/2021   Gastrostomy tube in place ([place]) 08/21/2021   PEG (percutaneous endoscopic gastrostomy) status ([place]) 07/23/2021   Failure  to thrive in adult 07/22/2021   Abdominal pain    Dyspnea    On enteral nutrition 05/25/2021   Impaired functional mobility, balance, gait, and endurance 05/25/2021   S/P thoracotomy     NSTEMI (non-ST elevated myocardial infarction) ([place]) 05/23/2021   Heart failure with reduced ejection fraction ([place]) 05/23/2021   Empyema ([place])    Acute pulmonary embolism (HCC)    Acute respiratory failure with hypoxemia (HCC)    Acute pulmonary edema (HCC)    Acute respiratory failure with hypoxia (HCC)    Acute renal failure (HCC)    Hydropneumothorax    Protein-calorie malnutrition, severe 05/15/2021   Pressure injury of skin 05/05/2021   Esophageal perforation 05/05/2021   GERD (gastroesophageal reflux disease) 12/19/2020   Breast lump 12/07/2018   Type 2 diabetes mellitus with other specified complication ([place]) 16/07/9603   Debility 06/30/2011   History of thyroid nodule 01/16/2011   History of breast cancer (right)  stage 1 ERA positive    History of cervical cancer    Hyperlipidemia    Hypertension    CAD (coronary artery disease), native coronary artery    PCP:  Gifford Shave, MD Pharmacy:   [id number] - [place], [place] [address] [place] [address] Phone: [phone] Fax: [phone]     Social Determinants of Health (SDOH) Interventions    Readmission Risk Interventions No flowsheet data found.

## 2021-11-07 NOTE — Progress Notes (Signed)
FPTS Brief Progress Note  S: Patient is lying in bed comfortably when I entered the room.  She reports that she is concerned about the conversation that she had today with her providers.  She does report that she recently saw urology and saw Dr. Jeffie Pollock.  We cannot see these records because she was seen at [facility] urology.  O: BP 130/68 (BP Location: Left Arm)    Pulse (!) 55    Temp 98.2 F (36.8 C) (Oral)    Resp 20    Ht 5\' 2"  (1.575 m)    Wt 55.5 kg    SpO2 96%    BMI 22.38 kg/m   General: [place] 81 year old female Respiratory: Normal work of breathing Cardiac: Regular rate  A/P: Hematuria 2/2 possible bladder mass versus pyelonephritis Patient's urine culture growing E. coli.  Denies any symptoms at this time.  We will continue antibiotics and continue to monitor CBCs. - Continuing to hold Entresto, Lasix, spironolactone, Jardiance. - Will relate to day team that she is already established at urology clinic and see if they can get in touch with urology to schedule appointment after discharge  - Orders reviewed. Labs for AM ordered, which was adjusted as needed.    Gifford Shave, MD 11/07/2021, 8:05 PM PGY-3, [place] Family Medicine Night Resident  Please page [phone] with questions.

## 2021-11-08 LAB — BASIC METABOLIC PANEL
Anion gap: 7 (ref 5–15)
BUN: 12 mg/dL (ref 8–23)
CO2: 25 mmol/L (ref 22–32)
Calcium: 8.7 mg/dL — ABNORMAL LOW (ref 8.9–10.3)
Chloride: 108 mmol/L (ref 98–111)
Creatinine, Ser: 1.05 mg/dL — ABNORMAL HIGH (ref 0.44–1.00)
GFR, Estimated: 54 mL/min — ABNORMAL LOW (ref >60)
Glucose, Bld: 142 mg/dL — ABNORMAL HIGH (ref 70–99)
Potassium: 3.4 mmol/L — ABNORMAL LOW (ref 3.5–5.1)
Sodium: 140 mmol/L (ref 135–145)

## 2021-11-08 LAB — URINE CULTURE: Culture: >=100000 — AB

## 2021-11-08 LAB — GLUCOSE, CAPILLARY
Glucose-Capillary: 142 mg/dL — ABNORMAL HIGH (ref 70–99)
Glucose-Capillary: 157 mg/dL — ABNORMAL HIGH (ref 70–99)
Glucose-Capillary: 136 mg/dL — ABNORMAL HIGH (ref 70–99)

## 2021-11-08 LAB — CBC
HCT: 32 % — ABNORMAL LOW (ref 36.0–46.0)
Hemoglobin: 10.4 g/dL — ABNORMAL LOW (ref 12.0–15.0)
MCH: 28.7 pg (ref 26.0–34.0)
MCHC: 32.5 g/dL (ref 30.0–36.0)
MCV: 88.4 fL (ref 80.0–100.0)
Platelets: 125 10*3/uL — ABNORMAL LOW (ref 150–400)
RBC: 3.62 MIL/uL — ABNORMAL LOW (ref 3.87–5.11)
RDW: 18.9 % — ABNORMAL HIGH (ref 11.5–15.5)
WBC: 4.4 10*3/uL (ref 4.0–10.5)
nRBC: 0 % (ref 0.0–0.2)

## 2021-11-08 MED ORDER — METOPROLOL TARTRATE 12.5 MG HALF TABLET: 12.5000 mg | ORAL_TABLET | Freq: Two times a day (BID) | ORAL | Status: DC

## 2021-11-08 MED ORDER — METOPROLOL TARTRATE 25 MG PO TABS: 12.5000 mg | ORAL_TABLET | Freq: Two times a day (BID) | ORAL | 0 refills | Status: DC

## 2021-11-08 MED ORDER — SACUBITRIL-VALSARTAN 24-26 MG PO TABS: 0.5000 | ORAL_TABLET | Freq: Two times a day (BID) | ORAL | 0 refills | Status: DC

## 2021-11-08 MED ORDER — SACUBITRIL-VALSARTAN 24-26 MG PO TABS
0.5000 | ORAL_TABLET | Freq: Two times a day (BID) | ORAL | Status: DC
  Administered 2021-11-08: 0.5 via ORAL
  Filled 2021-11-08 (×2): qty 0.5

## 2021-11-08 MED ORDER — CEPHALEXIN 500 MG PO CAPS: 500.0000 mg | ORAL_CAPSULE | Freq: Two times a day (BID) | ORAL | Status: DC

## 2021-11-08 MED ORDER — POTASSIUM CHLORIDE CRYS ER 20 MEQ PO TBCR
40.0000 meq | EXTENDED_RELEASE_TABLET | Freq: Once | ORAL | Status: AC
  Administered 2021-11-08: 40 meq via ORAL
  Filled 2021-11-08: qty 2

## 2021-11-08 MED ORDER — CEPHALEXIN 500 MG PO CAPS: 500.0000 mg | ORAL_CAPSULE | Freq: Two times a day (BID) | ORAL | 0 refills | Status: AC

## 2021-11-08 NOTE — Discharge Summary (Addendum)
[facility] Discharge Summary  Patient name: Dana Coleman record number: [id number] Date of birth: [birth year] Age: 81 y.o. Gender: female Date of Admission: 11/05/2021  Date of Discharge: 11/08/2021 Admitting Physician: Kinnie Feil, MD  Primary Care Provider: Gifford Shave, MD Consultants: None  Indication for Hospitalization: Hematuria  Discharge Diagnoses/Problem List:  T2DM Protein calorie malnutrition, severe Acute renal failure Heart failure with reduced ejection fraction UTI Pyelonephritis Hematuria  Disposition: Home with home health  Discharge Condition: Stable  Discharge Exam:   General: NAD, laying in bed comfortably Cardiovascular: No murmurs rubs or gallops Respiratory: Clear to auscultation bilaterally no wheezes rales or crackles no increased work of breathing Abdomen: Soft, nontender to palpation, normoactive bowel sounds, no suprapubic tenderness right CVA tenderness present Extremities: No lower extremity edema    Brief Hospital Course:  Hematuria with concern for Pyelonephritis/?Bladder Mass This morning patient's abdomen was soft and had right CVA tenderness that has remained afebrile.  UA was cloudy and positive for hematuria, anuria pyuria and glycosuria.  Renal ultrasound showed 5.6 cm complex multi septated cystic abnormality is noted in upper pole left kidney. Multiple other bilateral simple renal cysts are noted. Abnormal solid abnormality seen posteriorly in the urinary bladder which may represent layering debris, although possible mass cannot be excluded. Cystoscopy is recommended for further evaluation. Has had weight loss as well.  Urine culture grew greater than 100,000 colonies of E. coli and patient was transitioned from ceftriaxone (3 days) to Keflex on day of discharge (7 days) for a total of 10-day antibiotics course.  Urology appointment was made at discharge.  Urology appointment was made at  discharge.  Fall with weakness Patient was dizzy and had a fall prior to admission.  Cervical and head CT were negative.  Patient did have orthostatic vitals performed which showed 20 point drop in systolic when standing.  Patient was also on multiple antihypertensives/agents that could drop blood pressure which was adjusted as described in follow-up recommendations below.  HFrEF Patient was given gentle IV fluids initially and remained euvolemic during admission without dyspnea.  AKI Creatinine on admission was 1.70 with baseline around 0.7. IVF were given due to dehydration and polyuria. Creatinine at end of hospitalization was 1.05.  Chronic conditions remained stable   Issues for Follow Up:  F/u US in 6-12 months for stability of cyst and r/o neoplasm Urology follow-up was made on 1/31 consider cystoscopy for hematuria and renal ultrasound findings Jardiance was discontinued at discharge due to UTI, restart as appropriate outpatient for heart failure Encourage hydration as well as appropriate counseling for orthostatic hypotension Metoprolol was decreased to 12.5 mg twice daily due to soft blood pressures and heart rates and restarted a small dose of Entresto 24-26 (0.5 tablet twice daily) escalate these medicines as tolerated Repeat BMP given patient had ypokalemia and AKI and spironolactone was held at discharge  Significant Procedures: None  Significant Labs and Imaging:  Recent Labs  Lab 11/05/21 1328 11/07/21 0050 11/08/21 0056  WBC 7.7 5.0 4.4  HGB 12.5 10.3* 10.4*  HCT 38.9 31.7* 32.0*  PLT 137* 116* 125*   Recent Labs  Lab 11/05/21 1328 11/06/21 0326 11/07/21 0050 11/08/21 0056  NA 137 135 139 140  K 3.9 3.3* 3.3* 3.4*  CL 100 99 104 108  CO2 22 22 24 25   GLUCOSE 156* 126* 152* 142*  BUN 19 19 14 12   CREATININE 1.70* 1.43* 1.38* 1.05*  CALCIUM 9.0 8.5* 8.5* 8.7*  ALKPHOS  67  --   --   --   AST 27  --   --   --   ALT 16  --   --   --   ALBUMIN 3.1*  --    --   --       Results/Tests Pending at Time of Discharge:   Discharge Medications:  Allergies as of 11/08/2021   No Known Allergies      Medication List     STOP taking these medications    furosemide 20 MG tablet Commonly known as: LASIX   furosemide 40 MG tablet Commonly known as: LASIX   Jardiance 10 MG Tabs tablet Generic drug: empagliflozin   spironolactone 25 MG tablet Commonly known as: ALDACTONE       TAKE these medications    acetaminophen 500 MG tablet Commonly known as: TYLENOL Take 500 mg by mouth every 6 (six) hours as needed for moderate pain or headache.   amiodarone 100 MG tablet Commonly known as: PACERONE Take 1 tablet (100 mg total) by mouth daily. What changed: Another medication with the same name was removed. Continue taking this medication, and follow the directions you see here.   apixaban 5 MG Tabs tablet Commonly known as: ELIQUIS Take 1 tablet (5 mg total) by mouth 2 (two) times daily.   aspirin EC 81 MG tablet Take 81 mg by mouth in the morning. Swallow whole.   Blood Glucose Monitoring Suppl Misc daily.   cephALEXin 500 MG capsule Commonly known as: KEFLEX Take 1 capsule (500 mg total) by mouth 2 (two) times daily for 7 days.   feeding supplement (GLUCERNA SHAKE) Liqd Take 237 mLs by mouth 3 (three) times daily between meals. What changed:  when to take this additional instructions   glucose blood test strip Commonly known as: ONE TOUCH ULTRA TEST CHECK BLOOD SUGAR TWICE DAILY What changed:  when to take this additional instructions   Insulin Pen Needle 31G X 5 MM Misc Commonly known as: B-D UF III MINI PEN NEEDLES INJECT INSULIN VIA PEN 6 TIMES DAILY   Lantus SoloStar 100 UNIT/ML Solostar Pen Generic drug: insulin glargine Inject 6 Units into the skin daily.   Melatonin 3 MG Tbdp Take 6 mg by mouth at bedtime.   metoprolol tartrate 25 MG tablet Commonly known as: LOPRESSOR Take 0.5 tablets (12.5 mg total)  by mouth 2 (two) times daily. What changed: how much to take   nitroGLYCERIN 0.4 MG SL tablet Commonly known as: NITROSTAT Place 1 tablet (0.4 mg total) under the tongue every 5 (five) minutes as needed for chest pain.   omeprazole 20 MG capsule Commonly known as: PRILOSEC Take 40 mg by mouth every morning.   ondansetron 4 MG tablet Commonly known as: Zofran Take 1 tablet (4 mg total) by mouth daily as needed for nausea or vomiting.   OneTouch Delica Lancets 06Y Misc 1 Container by Does not apply route as needed. What changed: when to take this   rosuvastatin 20 MG tablet Commonly known as: CRESTOR TAKE 1 TABLET BY MOUTH EVERYDAY AT BEDTIME What changed: See the new instructions.   sacubitril-valsartan 24-26 MG Commonly known as: ENTRESTO Take 0.5 tablets by mouth 2 (two) times daily. What changed: how much to take   traZODone 50 MG tablet Commonly known as: DESYREL Take 50 mg by mouth at bedtime as needed for sleep.        Discharge Instructions: Please refer to Patient Instructions section of EMR for full  details.  Patient was counseled important signs and symptoms that should prompt return to medical care, changes in medications, dietary instructions, activity restrictions, and follow up appointments.   Follow-Up Appointments:  Follow-up Information     [facility] .          [place], [place] Follow up.   Why: HH-PT/OT/RN/SW arranged- They will contact you to schedule an apt Contact information: [place] [address] [phone]         [facility]. Go on 11/11/2021.   Specialty: Family Medicine Why: Your appointment is scheduled with Dr. Owens Shark @2 :25PM Contact information: [address] [phone] [place]. Go on 11/12/2021.   Why: Please arrive at 1:30 p.m. for a 1:45 pm appointment  time Contact information: [place]                Gerrit Heck, MD 11/08/2021, 3:25 PM PGY-1, [place]

## 2021-11-08 NOTE — Progress Notes (Signed)
Family Medicine Teaching Service Daily Progress Note Intern Pager: [phone]  Patient name: Dana Coleman Medical record number: [id number] Date of birth: [birth year] Age: 81 y.o. Gender: female  Primary Care Provider: Gifford Shave, MD Consultants: None Code Status: Full  Pt Overview and Major Events to Date:  1/24 admitted 1/25 renal ultrasound showed renal cyst and possible bladder mass  Assessment and Plan:  [name] is a 81 year old female presenting with hematuria.  Past medical history significant for HFrEF, T2DM, insomnia, A. fib with RVR, HTN, CAD, GERD  Hematuria likely secondary to possible bladder mass versus pyelonephritis Patient's abdomen this morning was soft nontender to palpation but does have right CVA tenderness.  Urine culture yesterday grew over 100,000 colonies of E. Coli.  WBC today 1.5.  Patient denies any dysuria but continues to have hematuria. -PT/OT -Encourage p.o. -S/p 3 doses of ceftriaxone transition to keflex 500 BID for 7 day -Hold Jardiance at discharge  Fall with weakness likely secondary to orthostatic hypotension  Has been dizzy when standing up and orthostatic vitals showed drop of 20 and SBP when standing. -Monitor -decrease metoprolol 12.5 BID -Restart small dose of entresto  -Close PCP follow-up -Hold Lasix, spironolactone, Jardiance  Pancytopenia WBC 4.4, hemoglobin 10.4, RBC 3.6 platelets 125.  Could be secondary to possible bladder mass -Monitor  Hypokalemia Potassium 3.4 -Repleted 40 mg packet  AKI, resolved Creatinine 1.05 from 1.38 yesterday. -Monitor  HFrEF   Last echo with EF 25 to 30% in August 2022.Urine output in last 24 hours is 600 mL (0.4 ml/kg/hr) appears euvolemic on examination -Monitor fluid status -Daily weights -Strict I's and O's  A. fib   history of PE Heart rates have been 50s to 70s -Amiodarone 100 mg daily -Eliquis 5 mg twice daily -Metoprolol 25 mg twice daily -Monitor  electrolytes  HTN Blood pressures have been 119-130/68-77. -Hold home antihypertensives -Monitor vitals  Chronic/stable HLD-rosuvastatin 20 Protein calorie malnutrition-consider RD consult Insomnia- trazadone prn GERD-omeprazole CAD-aspirin and nitrostat  FEN/GI: Heart healthy carb modified 1200 mL fluid restriction PPx: Eliquis Dispo: Home with home health PT likely today or tomorrow  Subjective:  No acute concerns this morning denies any dysuria but continues to have hematuria  Objective: Temp:  [97.8 F (36.6 C)-98.9 F (37.2 C)] 98.2 F (36.8 C) (01/27 0316) Pulse Rate:  [55-68] 57 (01/27 0316) Resp:  [18-20] 20 (01/27 0316) BP: (119-130)/(68-77) 129/69 (01/27 0316) SpO2:  [95 %-99 %] 97 % (01/27 0316) Weight:  [55.6 kg] 55.6 kg (01/27 0500) Physical Exam: General: NAD, laying in bed comfortably Cardiovascular: No murmurs rubs or gallops Respiratory: Clear to auscultation bilaterally no wheezes rales or crackles no increased work of breathing Abdomen: Soft, nontender to palpation, right CVA tenderness present Extremities: No lower extremity edema  Laboratory: Recent Labs  Lab 11/05/21 1328 11/07/21 0050 11/08/21 0056  WBC 7.7 5.0 4.4  HGB 12.5 10.3* 10.4*  HCT 38.9 31.7* 32.0*  PLT 137* 116* 125*   Recent Labs  Lab 11/05/21 1328 11/06/21 0326 11/07/21 0050 11/08/21 0056  NA 137 135 139 140  K 3.9 3.3* 3.3* 3.4*  CL 100 99 104 108  CO2 22 22 24 25   BUN 19 19 14 12   CREATININE 1.70* 1.43* 1.38* 1.05*  CALCIUM 9.0 8.5* 8.5* 8.7*  PROT 7.4  --   --   --   BILITOT 0.6  --   --   --   ALKPHOS 67  --   --   --   ALT 16  --   --   --  AST 27  --   --   --   GLUCOSE 156* 126* 152* 142*      Imaging/Diagnostic Tests:   Gerrit Heck, MD 11/08/2021, 8:06 AM PGY-1, [place] Intern pager: [phone], text pages welcome

## 2021-11-08 NOTE — Discharge Instructions (Addendum)
Dear Dana Coleman,   Thank you so much for allowing [place] to be part of your care!  You were admitted to [facility] for blood in your urine and possible kidney infection.  Please follow-up with your urologist on Tuesday to figure out if there is anything going on with your bladder.   POST-HOSPITAL & CARE INSTRUCTIONS Please follow-up with urology on 1/31 at 1:45 PM (arrive at 1:30 pm) Please let PCP/Specialists know of any changes that were made.  Please see medications section of this packet for any medication changes.   DOCTOR'S APPOINTMENT & FOLLOW UP CARE INSTRUCTIONS  Future Appointments  Date Time Provider [place]  11/11/2021  2:25 PM Orvis Brill, DO [facility]  11/19/2021 10:30 AM Gifford Shave, MD [facility]    RETURN PRECAUTIONS:   Take care and be well!  [facility]  [place], [place] [address] [phone]   Medicare Outpatient Observation Notice   Patient name:  Dana Coleman Patient number:  [id number]                                                                                                                                                                       [facility] a hospital outpatient receiving observation services. You are not an inpatient because:    pyleonephritis   You require hospital care for evaluation and/or treatment.  It is expected you will need hospital care for less than a total of two days.                                                                                                                                                                         Being an outpatient may affect what you pay in a hospital:   When youre a hospital outpatient, your observation stay is covered under Medicare Part B.   For Part B services, you generally  pay:   A copayment for each outpatient hospital service you get. Part B  copayments may vary by type of service.   20% of the Medicare-approved amount for most doctor services, after the Part B deductible.   Observation services may affect coverage and payment of your care after you leave the hospital:     If you need skilled nursing facility (SNF) care after you leave the hospital, Medicare Part A will only cover SNF care if youve had a 3-day minimum, medically necessary, inpatient hospital stay for a related illness or injury. An inpatient hospital stay begins the day the hospital admits you as an inpatient based on a doctors order and doesnt include the day youre discharged.   If you have Medicaid, a Medicare Advantage plan or other health plan, Medicaid or the plan may have different rules for SNF coverage after you leave the hospital. Check with Medicaid or your plan.   NOTE: Medicare Part A generally doesnt cover outpatient hospital services, like an observation stay. However, Part A will generally cover medically necessary inpatient services if the hospital admits you as an inpatient based on a doctors order. In most cases, youll pay a one-time deductible for all of your inpatient hospital services for the first 60 days youre in a hospital.                                                                                                                                                                      If you have any questions about your observation services, ask the hospital staff member giving you this notice or the doctor providing your hospital care. You can also ask to speak with someone from the hospitals utilization or discharge planning department.   You can also call 1-800-MEDICARE ([phone] 602-5082).  TTY users should call 3325924102.   Form CMS 78242-PNTI   Expiration 10/12/2021 OMB APPROVAL [id number]          Your costs for medications:     Generally, prescription and over-the-counter drugs, including self-administered drugs,  you get in a hospital outpatient setting (like an emergency department) arent covered by Part B. Self- administered drugs are drugs youd normally take on your own. For safety reasons, many hospitals dont allow you to take medications brought from home. If you have a Medicare prescription drug plan (Part D), your plan may help you pay for these drugs. Youll likely need to pay out-of- pocket for these drugs and submit a claim to your drug plan for a refund. Contact your drug plan for more information.  If youre enrolled in a Medicare Advantage plan (like an HMO or PPO) or other Medicare health plan (Part C), your costs and coverage may be different. Check with your plan to find out about coverage for outpatient observation services.    If youre a Chief of Staff through your state Medicaid program, you cant be billed for Part A or Part B deductibles, coinsurance, and copayments.                                                                                                                                                                      Additional Information (Optional):                                                                                                                                                                                Please sign below to show you received and understand this notice.                                         Date: 11/08/21 / Time:9:44 AM   CMS does not discriminate in its programs and activities. To request this publication in alternative format, please call: 1-800-MEDICARE or email:[email or web address].   Form CMS 03559-RCBU   Expiration 10/12/2021 OMB APPROVAL [id number]

## 2021-11-08 NOTE — Care Management Obs Status (Signed)
[place] NOTIFICATION   Patient Details  Name: Dana Coleman MRN: [id number] Date of Birth: 1941/05/31   Medicare Observation Status Notification Given:  Yes  Notice given via phone, permission to sign  Verdell Carmine, RN 11/08/2021, 9:45 AM

## 2021-11-09 ENCOUNTER — Other Ambulatory Visit: Payer: Self-pay | Admitting: Family Medicine

## 2021-11-10 ENCOUNTER — Other Ambulatory Visit: Payer: Self-pay | Admitting: Family Medicine

## 2021-11-10 DIAGNOSIS — Z794 Long term (current) use of insulin: Secondary | ICD-10-CM | POA: Diagnosis not present

## 2021-11-10 DIAGNOSIS — E1136 Type 2 diabetes mellitus with diabetic cataract: Secondary | ICD-10-CM | POA: Diagnosis not present

## 2021-11-10 DIAGNOSIS — I110 Hypertensive heart disease with heart failure: Secondary | ICD-10-CM | POA: Diagnosis not present

## 2021-11-10 DIAGNOSIS — I4891 Unspecified atrial fibrillation: Secondary | ICD-10-CM | POA: Diagnosis not present

## 2021-11-10 DIAGNOSIS — Z9181 History of falling: Secondary | ICD-10-CM | POA: Diagnosis not present

## 2021-11-10 DIAGNOSIS — Z7984 Long term (current) use of oral hypoglycemic drugs: Secondary | ICD-10-CM | POA: Diagnosis not present

## 2021-11-10 DIAGNOSIS — Z7901 Long term (current) use of anticoagulants: Secondary | ICD-10-CM | POA: Diagnosis not present

## 2021-11-10 DIAGNOSIS — Z86711 Personal history of pulmonary embolism: Secondary | ICD-10-CM | POA: Diagnosis not present

## 2021-11-10 DIAGNOSIS — Z931 Gastrostomy status: Secondary | ICD-10-CM | POA: Diagnosis not present

## 2021-11-10 DIAGNOSIS — K219 Gastro-esophageal reflux disease without esophagitis: Secondary | ICD-10-CM | POA: Diagnosis not present

## 2021-11-10 DIAGNOSIS — I5020 Unspecified systolic (congestive) heart failure: Secondary | ICD-10-CM | POA: Diagnosis not present

## 2021-11-10 DIAGNOSIS — E43 Unspecified severe protein-calorie malnutrition: Secondary | ICD-10-CM | POA: Diagnosis not present

## 2021-11-10 DIAGNOSIS — M8580 Other specified disorders of bone density and structure, unspecified site: Secondary | ICD-10-CM | POA: Diagnosis not present

## 2021-11-10 DIAGNOSIS — Z7982 Long term (current) use of aspirin: Secondary | ICD-10-CM | POA: Diagnosis not present

## 2021-11-10 DIAGNOSIS — D649 Anemia, unspecified: Secondary | ICD-10-CM | POA: Diagnosis not present

## 2021-11-10 DIAGNOSIS — I2510 Atherosclerotic heart disease of native coronary artery without angina pectoris: Secondary | ICD-10-CM | POA: Diagnosis not present

## 2021-11-10 DIAGNOSIS — E785 Hyperlipidemia, unspecified: Secondary | ICD-10-CM | POA: Diagnosis not present

## 2021-11-10 DIAGNOSIS — M722 Plantar fascial fibromatosis: Secondary | ICD-10-CM | POA: Diagnosis not present

## 2021-11-10 DIAGNOSIS — G4700 Insomnia, unspecified: Secondary | ICD-10-CM | POA: Diagnosis not present

## 2021-11-11 ENCOUNTER — Ambulatory Visit (INDEPENDENT_AMBULATORY_CARE_PROVIDER_SITE_OTHER): Payer: Medicare Other | Admitting: Student

## 2021-11-11 ENCOUNTER — Encounter: Payer: Self-pay | Admitting: Student

## 2021-11-11 ENCOUNTER — Other Ambulatory Visit: Payer: Self-pay

## 2021-11-11 VITALS — BP 128/72 | HR 54 | Ht 62.0 in | Wt 123.0 lb

## 2021-11-11 DIAGNOSIS — R319 Hematuria, unspecified: Secondary | ICD-10-CM | POA: Diagnosis not present

## 2021-11-11 DIAGNOSIS — I5020 Unspecified systolic (congestive) heart failure: Secondary | ICD-10-CM

## 2021-11-11 DIAGNOSIS — I10 Essential (primary) hypertension: Secondary | ICD-10-CM | POA: Diagnosis not present

## 2021-11-11 DIAGNOSIS — E876 Hypokalemia: Secondary | ICD-10-CM

## 2021-11-11 NOTE — Assessment & Plan Note (Signed)
Urology appointment tomorrow. Discussed return precautions with patient regarding fever/chills, abdominal pain, worsening hematuria.

## 2021-11-11 NOTE — Assessment & Plan Note (Signed)
BP well-controlled in office today, 128/72. Will keep medications at current dosage. Can reassess at next visit if blood pressure rises. Will get BMP today to ensure resolution of AKI and hypokalemia. - Continue metoprolol tartrate 12.5 mg BID - Continue entresto 24-26 0.5 tablet daily

## 2021-11-11 NOTE — Progress Notes (Signed)
° ° °  SUBJECTIVE:   CHIEF COMPLAINT / HPI:   Hospital follow-up Recently admitted for hematuria with concern for pyelnonephritis/?bladder mass. Has an appointment tomorrow 1/31 with urology. Continuing to take Keflex as prescribed (Start 1/27- End 2/3). No more blood in urine.  Also had a fall prior to admission with + orthostatic vitals. Has not had dizziness since then. Metoprolol was decreased to 12.5mg  twice daily d/t low blood pressures. Entresto 24-26 was restarted. She was hypokalemic and had an AKI. Since discharge, has not had any recent lower extremity swelling.  Type 2 DM Home glucose readings in the 130s. Drinking Glucerna three times daily without difficulty. Increased weight today from 118 to 123lbs.   PERTINENT  PMH / PSH: Reviewed  OBJECTIVE:   BP 128/72    Pulse (!) 54    Ht 5\' 2"  (1.575 m)    Wt 123 lb (55.8 kg)    SpO2 98%    BMI 22.50 kg/m   General: Elderly female, in no distress, ambulates with walker Resp: Clear in all fields, no wheezing or crackles. Slightly diminished on right side. CV: Regular rate, rhythm Abdomen: Soft, non-distended Skin: Warm, dry    ASSESSMENT/PLAN:   Hematuria Urology appointment tomorrow. Discussed return precautions with patient regarding fever/chills, abdominal pain, worsening hematuria.   Hypertension BP well-controlled in office today, 128/72. Will keep medications at current dosage. Can reassess at next visit if blood pressure rises. Will get BMP today to ensure resolution of AKI and hypokalemia. - Continue metoprolol tartrate 12.5 mg BID - Continue entresto 24-26 0.5 tablet daily     Orvis Brill, DO [place]

## 2021-11-11 NOTE — Patient Instructions (Addendum)
It was great to see you, Ms. Ureta. I am so glad you are feeling better.  Continue taking your medications as prescribed and go to your Urology appointment tomorrow.   All the best, Dr. Orvis Brill

## 2021-11-12 DIAGNOSIS — D414 Neoplasm of uncertain behavior of bladder: Secondary | ICD-10-CM | POA: Diagnosis not present

## 2021-11-12 DIAGNOSIS — N10 Acute tubulo-interstitial nephritis: Secondary | ICD-10-CM | POA: Diagnosis not present

## 2021-11-12 DIAGNOSIS — N281 Cyst of kidney, acquired: Secondary | ICD-10-CM | POA: Diagnosis not present

## 2021-11-12 LAB — BASIC METABOLIC PANEL
BUN/Creatinine Ratio: 8 — ABNORMAL LOW (ref 12–28)
BUN: 8 mg/dL (ref 8–27)
CO2: 19 mmol/L — ABNORMAL LOW (ref 20–29)
Calcium: 8.8 mg/dL (ref 8.7–10.3)
Chloride: 109 mmol/L — ABNORMAL HIGH (ref 96–106)
Creatinine, Ser: 1.03 mg/dL — ABNORMAL HIGH (ref 0.57–1.00)
Glucose: 261 mg/dL — ABNORMAL HIGH (ref 70–99)
Potassium: 4.6 mmol/L (ref 3.5–5.2)
Sodium: 143 mmol/L (ref 134–144)
eGFR: 55 mL/min/{1.73_m2} — ABNORMAL LOW (ref >59)

## 2021-11-13 DIAGNOSIS — I509 Heart failure, unspecified: Secondary | ICD-10-CM | POA: Diagnosis not present

## 2021-11-15 ENCOUNTER — Telehealth: Payer: Self-pay

## 2021-11-15 DIAGNOSIS — I110 Hypertensive heart disease with heart failure: Secondary | ICD-10-CM | POA: Diagnosis not present

## 2021-11-15 DIAGNOSIS — M8580 Other specified disorders of bone density and structure, unspecified site: Secondary | ICD-10-CM | POA: Diagnosis not present

## 2021-11-15 DIAGNOSIS — D649 Anemia, unspecified: Secondary | ICD-10-CM | POA: Diagnosis not present

## 2021-11-15 DIAGNOSIS — Z794 Long term (current) use of insulin: Secondary | ICD-10-CM | POA: Diagnosis not present

## 2021-11-15 DIAGNOSIS — G4700 Insomnia, unspecified: Secondary | ICD-10-CM | POA: Diagnosis not present

## 2021-11-15 DIAGNOSIS — Z7984 Long term (current) use of oral hypoglycemic drugs: Secondary | ICD-10-CM | POA: Diagnosis not present

## 2021-11-15 DIAGNOSIS — Z7901 Long term (current) use of anticoagulants: Secondary | ICD-10-CM | POA: Diagnosis not present

## 2021-11-15 DIAGNOSIS — I4891 Unspecified atrial fibrillation: Secondary | ICD-10-CM | POA: Diagnosis not present

## 2021-11-15 DIAGNOSIS — Z931 Gastrostomy status: Secondary | ICD-10-CM | POA: Diagnosis not present

## 2021-11-15 DIAGNOSIS — Z7982 Long term (current) use of aspirin: Secondary | ICD-10-CM | POA: Diagnosis not present

## 2021-11-15 DIAGNOSIS — M722 Plantar fascial fibromatosis: Secondary | ICD-10-CM | POA: Diagnosis not present

## 2021-11-15 DIAGNOSIS — E785 Hyperlipidemia, unspecified: Secondary | ICD-10-CM | POA: Diagnosis not present

## 2021-11-15 DIAGNOSIS — I5020 Unspecified systolic (congestive) heart failure: Secondary | ICD-10-CM | POA: Diagnosis not present

## 2021-11-15 DIAGNOSIS — E1136 Type 2 diabetes mellitus with diabetic cataract: Secondary | ICD-10-CM | POA: Diagnosis not present

## 2021-11-15 DIAGNOSIS — K219 Gastro-esophageal reflux disease without esophagitis: Secondary | ICD-10-CM | POA: Diagnosis not present

## 2021-11-15 DIAGNOSIS — I2510 Atherosclerotic heart disease of native coronary artery without angina pectoris: Secondary | ICD-10-CM | POA: Diagnosis not present

## 2021-11-15 DIAGNOSIS — Z9181 History of falling: Secondary | ICD-10-CM | POA: Diagnosis not present

## 2021-11-15 DIAGNOSIS — Z86711 Personal history of pulmonary embolism: Secondary | ICD-10-CM | POA: Diagnosis not present

## 2021-11-15 DIAGNOSIS — E43 Unspecified severe protein-calorie malnutrition: Secondary | ICD-10-CM | POA: Diagnosis not present

## 2021-11-15 NOTE — Telephone Encounter (Signed)
Amy [facility] RN calls nurse line requesting verbal orders as follows.  1x week for 2 months   Verbal order given per [facility] protocol.

## 2021-11-15 NOTE — Telephone Encounter (Signed)
Ebony Hail from [facility] (Palliative) calling to request verbal orders to begin seeing patient. Home Health RN, Amy recommended this resource for patient.   Verbal orders given per [facility] protocol  Talbot Grumbling, RN

## 2021-11-19 ENCOUNTER — Other Ambulatory Visit: Payer: Self-pay

## 2021-11-19 ENCOUNTER — Encounter: Payer: Self-pay | Admitting: Family Medicine

## 2021-11-19 ENCOUNTER — Ambulatory Visit (INDEPENDENT_AMBULATORY_CARE_PROVIDER_SITE_OTHER): Payer: Medicare Other | Admitting: Family Medicine

## 2021-11-19 VITALS — BP 130/72 | HR 53 | Ht 62.0 in | Wt 124.2 lb

## 2021-11-19 DIAGNOSIS — E785 Hyperlipidemia, unspecified: Secondary | ICD-10-CM | POA: Diagnosis not present

## 2021-11-19 DIAGNOSIS — I10 Essential (primary) hypertension: Secondary | ICD-10-CM

## 2021-11-19 DIAGNOSIS — Z794 Long term (current) use of insulin: Secondary | ICD-10-CM

## 2021-11-19 DIAGNOSIS — I4891 Unspecified atrial fibrillation: Secondary | ICD-10-CM | POA: Diagnosis not present

## 2021-11-19 DIAGNOSIS — I5020 Unspecified systolic (congestive) heart failure: Secondary | ICD-10-CM | POA: Diagnosis not present

## 2021-11-19 DIAGNOSIS — Z7901 Long term (current) use of anticoagulants: Secondary | ICD-10-CM | POA: Diagnosis not present

## 2021-11-19 DIAGNOSIS — Z7984 Long term (current) use of oral hypoglycemic drugs: Secondary | ICD-10-CM | POA: Diagnosis not present

## 2021-11-19 DIAGNOSIS — E1169 Type 2 diabetes mellitus with other specified complication: Secondary | ICD-10-CM

## 2021-11-19 DIAGNOSIS — M722 Plantar fascial fibromatosis: Secondary | ICD-10-CM | POA: Diagnosis not present

## 2021-11-19 DIAGNOSIS — M8580 Other specified disorders of bone density and structure, unspecified site: Secondary | ICD-10-CM | POA: Diagnosis not present

## 2021-11-19 DIAGNOSIS — E876 Hypokalemia: Secondary | ICD-10-CM | POA: Diagnosis not present

## 2021-11-19 DIAGNOSIS — I110 Hypertensive heart disease with heart failure: Secondary | ICD-10-CM | POA: Diagnosis not present

## 2021-11-19 DIAGNOSIS — D649 Anemia, unspecified: Secondary | ICD-10-CM | POA: Diagnosis not present

## 2021-11-19 DIAGNOSIS — Z86711 Personal history of pulmonary embolism: Secondary | ICD-10-CM | POA: Diagnosis not present

## 2021-11-19 DIAGNOSIS — E43 Unspecified severe protein-calorie malnutrition: Secondary | ICD-10-CM | POA: Diagnosis not present

## 2021-11-19 DIAGNOSIS — I2510 Atherosclerotic heart disease of native coronary artery without angina pectoris: Secondary | ICD-10-CM | POA: Diagnosis not present

## 2021-11-19 DIAGNOSIS — Z7982 Long term (current) use of aspirin: Secondary | ICD-10-CM | POA: Diagnosis not present

## 2021-11-19 DIAGNOSIS — E1136 Type 2 diabetes mellitus with diabetic cataract: Secondary | ICD-10-CM | POA: Diagnosis not present

## 2021-11-19 DIAGNOSIS — Z9181 History of falling: Secondary | ICD-10-CM | POA: Diagnosis not present

## 2021-11-19 DIAGNOSIS — G4700 Insomnia, unspecified: Secondary | ICD-10-CM | POA: Diagnosis not present

## 2021-11-19 DIAGNOSIS — K219 Gastro-esophageal reflux disease without esophagitis: Secondary | ICD-10-CM | POA: Diagnosis not present

## 2021-11-19 MED ORDER — SACUBITRIL-VALSARTAN 24-26 MG PO TABS: 1.0000 | ORAL_TABLET | Freq: Two times a day (BID) | ORAL | 0 refills | Status: DC

## 2021-11-19 NOTE — Patient Instructions (Signed)
It was great seeing you today.  Your blood pressure was a little elevated on today's visit.  I do not want to restart all of your heart failure medications but I do want to increase your Entresto back to 1 tablet twice a day.  Please make an appointment with your heart failure doctor who can help determine which medications he would like to start back/increase back to the previous dose first.  If you have any questions or concerns please call the clinic.  Have A great day!

## 2021-11-19 NOTE — Progress Notes (Signed)
° ° °  SUBJECTIVE:   CHIEF COMPLAINT / HPI:   Blood pressure follow-up Patient reports that she has noticed increased blood pressures since being discharged from the hospital.  They decreased her blood pressure medications including cutting the metoprolol to 12.5 mg twice daily rather than 25 mg twice daily.  They also decreased her spironolactone to 0.5 tablets twice daily of the 24-26 days.  Patient reports that she has not had any signs or symptoms of hypertension but has noticed that her blood pressures have been elevated since being discharged and these medication changes were made.  She followed up in our clinic after her discharge and had repeat BMP collected to make sure her renal function had improved.  Renal function stable at 1.05.  OBJECTIVE:   BP 130/72    Pulse (!) 53    Ht 5\' 2"  (1.575 m)    Wt 124 lb 3.2 oz (56.3 kg)    SpO2 98%    BMI 22.72 kg/m   General: Pleasant 81 year old female in no acute distress Cardiac: Regular rate and rhythm, no murmurs appreciated Respiratory: Normal work of breathing, lungs are clear to auscultation bilaterally MSK: No gross abnormalities  ASSESSMENT/PLAN:   Hypertension Patient's blood pressure today stable at 130/72.  Initial evaluation showed a blood pressure of 170/90.  BMP stable with creatinine of 1.05.  We will continue her current dose of metoprolol tartrate 12.5 mg twice daily and increase her Entresto to her previous dose of 24-26 twice daily.  Instructed the patient to call cardiology to follow-up with her heart failure team given she was supposed to have an appointment with them while she was hospitalized but missed it due to the hospitalization.  She reports when she leaves the visit today she will schedule an appointment with them.     Gifford Shave, MD [place]

## 2021-11-20 DIAGNOSIS — I110 Hypertensive heart disease with heart failure: Secondary | ICD-10-CM | POA: Diagnosis not present

## 2021-11-20 DIAGNOSIS — E785 Hyperlipidemia, unspecified: Secondary | ICD-10-CM | POA: Diagnosis not present

## 2021-11-20 DIAGNOSIS — I4891 Unspecified atrial fibrillation: Secondary | ICD-10-CM | POA: Diagnosis not present

## 2021-11-20 DIAGNOSIS — M8580 Other specified disorders of bone density and structure, unspecified site: Secondary | ICD-10-CM | POA: Diagnosis not present

## 2021-11-20 DIAGNOSIS — M722 Plantar fascial fibromatosis: Secondary | ICD-10-CM | POA: Diagnosis not present

## 2021-11-20 DIAGNOSIS — Z794 Long term (current) use of insulin: Secondary | ICD-10-CM | POA: Diagnosis not present

## 2021-11-20 DIAGNOSIS — D649 Anemia, unspecified: Secondary | ICD-10-CM | POA: Diagnosis not present

## 2021-11-20 DIAGNOSIS — G4700 Insomnia, unspecified: Secondary | ICD-10-CM | POA: Diagnosis not present

## 2021-11-20 DIAGNOSIS — I5020 Unspecified systolic (congestive) heart failure: Secondary | ICD-10-CM | POA: Diagnosis not present

## 2021-11-20 DIAGNOSIS — E1136 Type 2 diabetes mellitus with diabetic cataract: Secondary | ICD-10-CM | POA: Diagnosis not present

## 2021-11-20 DIAGNOSIS — K219 Gastro-esophageal reflux disease without esophagitis: Secondary | ICD-10-CM | POA: Diagnosis not present

## 2021-11-20 DIAGNOSIS — N179 Acute kidney failure, unspecified: Secondary | ICD-10-CM | POA: Diagnosis not present

## 2021-11-20 DIAGNOSIS — N12 Tubulo-interstitial nephritis, not specified as acute or chronic: Secondary | ICD-10-CM | POA: Diagnosis not present

## 2021-11-20 DIAGNOSIS — N281 Cyst of kidney, acquired: Secondary | ICD-10-CM | POA: Diagnosis not present

## 2021-11-20 DIAGNOSIS — Z7984 Long term (current) use of oral hypoglycemic drugs: Secondary | ICD-10-CM | POA: Diagnosis not present

## 2021-11-20 DIAGNOSIS — Z9181 History of falling: Secondary | ICD-10-CM | POA: Diagnosis not present

## 2021-11-20 DIAGNOSIS — E43 Unspecified severe protein-calorie malnutrition: Secondary | ICD-10-CM | POA: Diagnosis not present

## 2021-11-20 DIAGNOSIS — Z7982 Long term (current) use of aspirin: Secondary | ICD-10-CM | POA: Diagnosis not present

## 2021-11-20 DIAGNOSIS — Z86711 Personal history of pulmonary embolism: Secondary | ICD-10-CM | POA: Diagnosis not present

## 2021-11-20 DIAGNOSIS — I2510 Atherosclerotic heart disease of native coronary artery without angina pectoris: Secondary | ICD-10-CM | POA: Diagnosis not present

## 2021-11-20 DIAGNOSIS — N3001 Acute cystitis with hematuria: Secondary | ICD-10-CM | POA: Diagnosis not present

## 2021-11-20 DIAGNOSIS — M47892 Other spondylosis, cervical region: Secondary | ICD-10-CM | POA: Diagnosis not present

## 2021-11-20 DIAGNOSIS — I951 Orthostatic hypotension: Secondary | ICD-10-CM | POA: Diagnosis not present

## 2021-11-20 DIAGNOSIS — Z7901 Long term (current) use of anticoagulants: Secondary | ICD-10-CM | POA: Diagnosis not present

## 2021-11-20 DIAGNOSIS — R319 Hematuria, unspecified: Secondary | ICD-10-CM | POA: Diagnosis not present

## 2021-11-20 NOTE — Assessment & Plan Note (Signed)
Patient's blood pressure today stable at 130/72.  Initial evaluation showed a blood pressure of 170/90.  BMP stable with creatinine of 1.05.  We will continue her current dose of metoprolol tartrate 12.5 mg twice daily and increase her Entresto to her previous dose of 24-26 twice daily.  Instructed the patient to call cardiology to follow-up with her heart failure team given she was supposed to have an appointment with them while she was hospitalized but missed it due to the hospitalization.  She reports when she leaves the visit today she will schedule an appointment with them.

## 2021-11-21 DIAGNOSIS — Z794 Long term (current) use of insulin: Secondary | ICD-10-CM | POA: Diagnosis not present

## 2021-11-21 DIAGNOSIS — I5020 Unspecified systolic (congestive) heart failure: Secondary | ICD-10-CM | POA: Diagnosis not present

## 2021-11-21 DIAGNOSIS — I4891 Unspecified atrial fibrillation: Secondary | ICD-10-CM | POA: Diagnosis not present

## 2021-11-21 DIAGNOSIS — K219 Gastro-esophageal reflux disease without esophagitis: Secondary | ICD-10-CM | POA: Diagnosis not present

## 2021-11-21 DIAGNOSIS — E43 Unspecified severe protein-calorie malnutrition: Secondary | ICD-10-CM | POA: Diagnosis not present

## 2021-11-21 DIAGNOSIS — Z7901 Long term (current) use of anticoagulants: Secondary | ICD-10-CM | POA: Diagnosis not present

## 2021-11-21 DIAGNOSIS — M722 Plantar fascial fibromatosis: Secondary | ICD-10-CM | POA: Diagnosis not present

## 2021-11-21 DIAGNOSIS — Z86711 Personal history of pulmonary embolism: Secondary | ICD-10-CM | POA: Diagnosis not present

## 2021-11-21 DIAGNOSIS — Z9181 History of falling: Secondary | ICD-10-CM | POA: Diagnosis not present

## 2021-11-21 DIAGNOSIS — Z7984 Long term (current) use of oral hypoglycemic drugs: Secondary | ICD-10-CM | POA: Diagnosis not present

## 2021-11-21 DIAGNOSIS — G4700 Insomnia, unspecified: Secondary | ICD-10-CM | POA: Diagnosis not present

## 2021-11-21 DIAGNOSIS — I2510 Atherosclerotic heart disease of native coronary artery without angina pectoris: Secondary | ICD-10-CM | POA: Diagnosis not present

## 2021-11-21 DIAGNOSIS — E785 Hyperlipidemia, unspecified: Secondary | ICD-10-CM | POA: Diagnosis not present

## 2021-11-21 DIAGNOSIS — M8580 Other specified disorders of bone density and structure, unspecified site: Secondary | ICD-10-CM | POA: Diagnosis not present

## 2021-11-21 DIAGNOSIS — Z7982 Long term (current) use of aspirin: Secondary | ICD-10-CM | POA: Diagnosis not present

## 2021-11-21 DIAGNOSIS — E1136 Type 2 diabetes mellitus with diabetic cataract: Secondary | ICD-10-CM | POA: Diagnosis not present

## 2021-11-21 DIAGNOSIS — D649 Anemia, unspecified: Secondary | ICD-10-CM | POA: Diagnosis not present

## 2021-11-21 DIAGNOSIS — I110 Hypertensive heart disease with heart failure: Secondary | ICD-10-CM | POA: Diagnosis not present

## 2021-11-22 DIAGNOSIS — E1136 Type 2 diabetes mellitus with diabetic cataract: Secondary | ICD-10-CM | POA: Diagnosis not present

## 2021-11-22 DIAGNOSIS — Z7982 Long term (current) use of aspirin: Secondary | ICD-10-CM | POA: Diagnosis not present

## 2021-11-22 DIAGNOSIS — G4700 Insomnia, unspecified: Secondary | ICD-10-CM | POA: Diagnosis not present

## 2021-11-22 DIAGNOSIS — Z9181 History of falling: Secondary | ICD-10-CM | POA: Diagnosis not present

## 2021-11-22 DIAGNOSIS — I110 Hypertensive heart disease with heart failure: Secondary | ICD-10-CM | POA: Diagnosis not present

## 2021-11-22 DIAGNOSIS — Z86711 Personal history of pulmonary embolism: Secondary | ICD-10-CM | POA: Diagnosis not present

## 2021-11-22 DIAGNOSIS — E785 Hyperlipidemia, unspecified: Secondary | ICD-10-CM | POA: Diagnosis not present

## 2021-11-22 DIAGNOSIS — Z7901 Long term (current) use of anticoagulants: Secondary | ICD-10-CM | POA: Diagnosis not present

## 2021-11-22 DIAGNOSIS — E43 Unspecified severe protein-calorie malnutrition: Secondary | ICD-10-CM | POA: Diagnosis not present

## 2021-11-22 DIAGNOSIS — Z7984 Long term (current) use of oral hypoglycemic drugs: Secondary | ICD-10-CM | POA: Diagnosis not present

## 2021-11-22 DIAGNOSIS — E1169 Type 2 diabetes mellitus with other specified complication: Secondary | ICD-10-CM | POA: Diagnosis not present

## 2021-11-22 DIAGNOSIS — Z794 Long term (current) use of insulin: Secondary | ICD-10-CM | POA: Diagnosis not present

## 2021-11-22 DIAGNOSIS — I10 Essential (primary) hypertension: Secondary | ICD-10-CM | POA: Diagnosis not present

## 2021-11-22 DIAGNOSIS — D649 Anemia, unspecified: Secondary | ICD-10-CM | POA: Diagnosis not present

## 2021-11-22 DIAGNOSIS — M8580 Other specified disorders of bone density and structure, unspecified site: Secondary | ICD-10-CM | POA: Diagnosis not present

## 2021-11-22 DIAGNOSIS — I4891 Unspecified atrial fibrillation: Secondary | ICD-10-CM | POA: Diagnosis not present

## 2021-11-22 DIAGNOSIS — I5020 Unspecified systolic (congestive) heart failure: Secondary | ICD-10-CM | POA: Diagnosis not present

## 2021-11-22 DIAGNOSIS — I2510 Atherosclerotic heart disease of native coronary artery without angina pectoris: Secondary | ICD-10-CM | POA: Diagnosis not present

## 2021-11-22 DIAGNOSIS — I482 Chronic atrial fibrillation, unspecified: Secondary | ICD-10-CM | POA: Diagnosis not present

## 2021-11-22 DIAGNOSIS — K219 Gastro-esophageal reflux disease without esophagitis: Secondary | ICD-10-CM | POA: Diagnosis not present

## 2021-11-22 DIAGNOSIS — M722 Plantar fascial fibromatosis: Secondary | ICD-10-CM | POA: Diagnosis not present

## 2021-11-25 DIAGNOSIS — I5020 Unspecified systolic (congestive) heart failure: Secondary | ICD-10-CM | POA: Diagnosis not present

## 2021-11-25 DIAGNOSIS — K219 Gastro-esophageal reflux disease without esophagitis: Secondary | ICD-10-CM | POA: Diagnosis not present

## 2021-11-25 DIAGNOSIS — N179 Acute kidney failure, unspecified: Secondary | ICD-10-CM | POA: Diagnosis not present

## 2021-11-25 DIAGNOSIS — G4700 Insomnia, unspecified: Secondary | ICD-10-CM | POA: Diagnosis not present

## 2021-11-25 DIAGNOSIS — Z86711 Personal history of pulmonary embolism: Secondary | ICD-10-CM | POA: Diagnosis not present

## 2021-11-25 DIAGNOSIS — I110 Hypertensive heart disease with heart failure: Secondary | ICD-10-CM | POA: Diagnosis not present

## 2021-11-25 DIAGNOSIS — Z9181 History of falling: Secondary | ICD-10-CM | POA: Diagnosis not present

## 2021-11-25 DIAGNOSIS — I4891 Unspecified atrial fibrillation: Secondary | ICD-10-CM | POA: Diagnosis not present

## 2021-11-25 DIAGNOSIS — Z794 Long term (current) use of insulin: Secondary | ICD-10-CM | POA: Diagnosis not present

## 2021-11-25 DIAGNOSIS — Z7982 Long term (current) use of aspirin: Secondary | ICD-10-CM | POA: Diagnosis not present

## 2021-11-25 DIAGNOSIS — R319 Hematuria, unspecified: Secondary | ICD-10-CM | POA: Diagnosis not present

## 2021-11-25 DIAGNOSIS — E785 Hyperlipidemia, unspecified: Secondary | ICD-10-CM | POA: Diagnosis not present

## 2021-11-25 DIAGNOSIS — M722 Plantar fascial fibromatosis: Secondary | ICD-10-CM | POA: Diagnosis not present

## 2021-11-25 DIAGNOSIS — N12 Tubulo-interstitial nephritis, not specified as acute or chronic: Secondary | ICD-10-CM | POA: Diagnosis not present

## 2021-11-25 DIAGNOSIS — M8580 Other specified disorders of bone density and structure, unspecified site: Secondary | ICD-10-CM | POA: Diagnosis not present

## 2021-11-25 DIAGNOSIS — N281 Cyst of kidney, acquired: Secondary | ICD-10-CM | POA: Diagnosis not present

## 2021-11-25 DIAGNOSIS — M47892 Other spondylosis, cervical region: Secondary | ICD-10-CM | POA: Diagnosis not present

## 2021-11-25 DIAGNOSIS — E43 Unspecified severe protein-calorie malnutrition: Secondary | ICD-10-CM | POA: Diagnosis not present

## 2021-11-25 DIAGNOSIS — Z7901 Long term (current) use of anticoagulants: Secondary | ICD-10-CM | POA: Diagnosis not present

## 2021-11-25 DIAGNOSIS — D649 Anemia, unspecified: Secondary | ICD-10-CM | POA: Diagnosis not present

## 2021-11-25 DIAGNOSIS — I2510 Atherosclerotic heart disease of native coronary artery without angina pectoris: Secondary | ICD-10-CM | POA: Diagnosis not present

## 2021-11-25 DIAGNOSIS — E1136 Type 2 diabetes mellitus with diabetic cataract: Secondary | ICD-10-CM | POA: Diagnosis not present

## 2021-11-25 DIAGNOSIS — Z7984 Long term (current) use of oral hypoglycemic drugs: Secondary | ICD-10-CM | POA: Diagnosis not present

## 2021-11-25 DIAGNOSIS — I951 Orthostatic hypotension: Secondary | ICD-10-CM | POA: Diagnosis not present

## 2021-11-27 DIAGNOSIS — I110 Hypertensive heart disease with heart failure: Secondary | ICD-10-CM | POA: Diagnosis not present

## 2021-11-27 DIAGNOSIS — I4891 Unspecified atrial fibrillation: Secondary | ICD-10-CM | POA: Diagnosis not present

## 2021-11-27 DIAGNOSIS — Z9181 History of falling: Secondary | ICD-10-CM | POA: Diagnosis not present

## 2021-11-27 DIAGNOSIS — I951 Orthostatic hypotension: Secondary | ICD-10-CM | POA: Diagnosis not present

## 2021-11-27 DIAGNOSIS — I5020 Unspecified systolic (congestive) heart failure: Secondary | ICD-10-CM | POA: Diagnosis not present

## 2021-11-27 DIAGNOSIS — N179 Acute kidney failure, unspecified: Secondary | ICD-10-CM | POA: Diagnosis not present

## 2021-11-27 DIAGNOSIS — Z794 Long term (current) use of insulin: Secondary | ICD-10-CM | POA: Diagnosis not present

## 2021-11-27 DIAGNOSIS — Z7901 Long term (current) use of anticoagulants: Secondary | ICD-10-CM | POA: Diagnosis not present

## 2021-11-27 DIAGNOSIS — N12 Tubulo-interstitial nephritis, not specified as acute or chronic: Secondary | ICD-10-CM | POA: Diagnosis not present

## 2021-11-27 DIAGNOSIS — I2510 Atherosclerotic heart disease of native coronary artery without angina pectoris: Secondary | ICD-10-CM | POA: Diagnosis not present

## 2021-11-27 DIAGNOSIS — D649 Anemia, unspecified: Secondary | ICD-10-CM | POA: Diagnosis not present

## 2021-11-27 DIAGNOSIS — N281 Cyst of kidney, acquired: Secondary | ICD-10-CM | POA: Diagnosis not present

## 2021-11-27 DIAGNOSIS — R319 Hematuria, unspecified: Secondary | ICD-10-CM | POA: Diagnosis not present

## 2021-11-27 DIAGNOSIS — E1136 Type 2 diabetes mellitus with diabetic cataract: Secondary | ICD-10-CM | POA: Diagnosis not present

## 2021-11-27 DIAGNOSIS — Z7982 Long term (current) use of aspirin: Secondary | ICD-10-CM | POA: Diagnosis not present

## 2021-11-27 DIAGNOSIS — K219 Gastro-esophageal reflux disease without esophagitis: Secondary | ICD-10-CM | POA: Diagnosis not present

## 2021-11-27 DIAGNOSIS — E785 Hyperlipidemia, unspecified: Secondary | ICD-10-CM | POA: Diagnosis not present

## 2021-11-27 DIAGNOSIS — M47892 Other spondylosis, cervical region: Secondary | ICD-10-CM | POA: Diagnosis not present

## 2021-11-27 DIAGNOSIS — Z86711 Personal history of pulmonary embolism: Secondary | ICD-10-CM | POA: Diagnosis not present

## 2021-11-27 DIAGNOSIS — M722 Plantar fascial fibromatosis: Secondary | ICD-10-CM | POA: Diagnosis not present

## 2021-11-27 DIAGNOSIS — Z7984 Long term (current) use of oral hypoglycemic drugs: Secondary | ICD-10-CM | POA: Diagnosis not present

## 2021-11-27 DIAGNOSIS — E43 Unspecified severe protein-calorie malnutrition: Secondary | ICD-10-CM | POA: Diagnosis not present

## 2021-11-27 DIAGNOSIS — G4700 Insomnia, unspecified: Secondary | ICD-10-CM | POA: Diagnosis not present

## 2021-11-27 DIAGNOSIS — M8580 Other specified disorders of bone density and structure, unspecified site: Secondary | ICD-10-CM | POA: Diagnosis not present

## 2021-11-28 DIAGNOSIS — G4700 Insomnia, unspecified: Secondary | ICD-10-CM | POA: Diagnosis not present

## 2021-11-28 DIAGNOSIS — Z86711 Personal history of pulmonary embolism: Secondary | ICD-10-CM | POA: Diagnosis not present

## 2021-11-28 DIAGNOSIS — N12 Tubulo-interstitial nephritis, not specified as acute or chronic: Secondary | ICD-10-CM | POA: Diagnosis not present

## 2021-11-28 DIAGNOSIS — N179 Acute kidney failure, unspecified: Secondary | ICD-10-CM | POA: Diagnosis not present

## 2021-11-28 DIAGNOSIS — R319 Hematuria, unspecified: Secondary | ICD-10-CM | POA: Diagnosis not present

## 2021-11-28 DIAGNOSIS — I951 Orthostatic hypotension: Secondary | ICD-10-CM | POA: Diagnosis not present

## 2021-11-28 DIAGNOSIS — I110 Hypertensive heart disease with heart failure: Secondary | ICD-10-CM | POA: Diagnosis not present

## 2021-11-28 DIAGNOSIS — M47892 Other spondylosis, cervical region: Secondary | ICD-10-CM | POA: Diagnosis not present

## 2021-11-28 DIAGNOSIS — M8580 Other specified disorders of bone density and structure, unspecified site: Secondary | ICD-10-CM | POA: Diagnosis not present

## 2021-11-28 DIAGNOSIS — I5020 Unspecified systolic (congestive) heart failure: Secondary | ICD-10-CM | POA: Diagnosis not present

## 2021-11-28 DIAGNOSIS — Z7901 Long term (current) use of anticoagulants: Secondary | ICD-10-CM | POA: Diagnosis not present

## 2021-11-28 DIAGNOSIS — M722 Plantar fascial fibromatosis: Secondary | ICD-10-CM | POA: Diagnosis not present

## 2021-11-28 DIAGNOSIS — K219 Gastro-esophageal reflux disease without esophagitis: Secondary | ICD-10-CM | POA: Diagnosis not present

## 2021-11-28 DIAGNOSIS — Z794 Long term (current) use of insulin: Secondary | ICD-10-CM | POA: Diagnosis not present

## 2021-11-28 DIAGNOSIS — I4891 Unspecified atrial fibrillation: Secondary | ICD-10-CM | POA: Diagnosis not present

## 2021-11-28 DIAGNOSIS — E43 Unspecified severe protein-calorie malnutrition: Secondary | ICD-10-CM | POA: Diagnosis not present

## 2021-11-28 DIAGNOSIS — I2510 Atherosclerotic heart disease of native coronary artery without angina pectoris: Secondary | ICD-10-CM | POA: Diagnosis not present

## 2021-11-28 DIAGNOSIS — E785 Hyperlipidemia, unspecified: Secondary | ICD-10-CM | POA: Diagnosis not present

## 2021-11-28 DIAGNOSIS — N281 Cyst of kidney, acquired: Secondary | ICD-10-CM | POA: Diagnosis not present

## 2021-11-28 DIAGNOSIS — E1136 Type 2 diabetes mellitus with diabetic cataract: Secondary | ICD-10-CM | POA: Diagnosis not present

## 2021-11-28 DIAGNOSIS — Z9181 History of falling: Secondary | ICD-10-CM | POA: Diagnosis not present

## 2021-11-28 DIAGNOSIS — D649 Anemia, unspecified: Secondary | ICD-10-CM | POA: Diagnosis not present

## 2021-11-28 DIAGNOSIS — Z7982 Long term (current) use of aspirin: Secondary | ICD-10-CM | POA: Diagnosis not present

## 2021-11-28 DIAGNOSIS — Z7984 Long term (current) use of oral hypoglycemic drugs: Secondary | ICD-10-CM | POA: Diagnosis not present

## 2021-11-30 DIAGNOSIS — G4700 Insomnia, unspecified: Secondary | ICD-10-CM | POA: Diagnosis not present

## 2021-11-30 DIAGNOSIS — R319 Hematuria, unspecified: Secondary | ICD-10-CM | POA: Diagnosis not present

## 2021-11-30 DIAGNOSIS — I5020 Unspecified systolic (congestive) heart failure: Secondary | ICD-10-CM | POA: Diagnosis not present

## 2021-11-30 DIAGNOSIS — N281 Cyst of kidney, acquired: Secondary | ICD-10-CM | POA: Diagnosis not present

## 2021-11-30 DIAGNOSIS — N12 Tubulo-interstitial nephritis, not specified as acute or chronic: Secondary | ICD-10-CM | POA: Diagnosis not present

## 2021-11-30 DIAGNOSIS — Z7982 Long term (current) use of aspirin: Secondary | ICD-10-CM | POA: Diagnosis not present

## 2021-11-30 DIAGNOSIS — M47892 Other spondylosis, cervical region: Secondary | ICD-10-CM | POA: Diagnosis not present

## 2021-11-30 DIAGNOSIS — Z7984 Long term (current) use of oral hypoglycemic drugs: Secondary | ICD-10-CM | POA: Diagnosis not present

## 2021-11-30 DIAGNOSIS — M8580 Other specified disorders of bone density and structure, unspecified site: Secondary | ICD-10-CM | POA: Diagnosis not present

## 2021-11-30 DIAGNOSIS — Z86711 Personal history of pulmonary embolism: Secondary | ICD-10-CM | POA: Diagnosis not present

## 2021-11-30 DIAGNOSIS — E1136 Type 2 diabetes mellitus with diabetic cataract: Secondary | ICD-10-CM | POA: Diagnosis not present

## 2021-11-30 DIAGNOSIS — E785 Hyperlipidemia, unspecified: Secondary | ICD-10-CM | POA: Diagnosis not present

## 2021-11-30 DIAGNOSIS — Z7901 Long term (current) use of anticoagulants: Secondary | ICD-10-CM | POA: Diagnosis not present

## 2021-11-30 DIAGNOSIS — Z794 Long term (current) use of insulin: Secondary | ICD-10-CM | POA: Diagnosis not present

## 2021-11-30 DIAGNOSIS — I951 Orthostatic hypotension: Secondary | ICD-10-CM | POA: Diagnosis not present

## 2021-11-30 DIAGNOSIS — K219 Gastro-esophageal reflux disease without esophagitis: Secondary | ICD-10-CM | POA: Diagnosis not present

## 2021-11-30 DIAGNOSIS — N179 Acute kidney failure, unspecified: Secondary | ICD-10-CM | POA: Diagnosis not present

## 2021-11-30 DIAGNOSIS — M722 Plantar fascial fibromatosis: Secondary | ICD-10-CM | POA: Diagnosis not present

## 2021-11-30 DIAGNOSIS — I110 Hypertensive heart disease with heart failure: Secondary | ICD-10-CM | POA: Diagnosis not present

## 2021-11-30 DIAGNOSIS — D649 Anemia, unspecified: Secondary | ICD-10-CM | POA: Diagnosis not present

## 2021-11-30 DIAGNOSIS — I2510 Atherosclerotic heart disease of native coronary artery without angina pectoris: Secondary | ICD-10-CM | POA: Diagnosis not present

## 2021-11-30 DIAGNOSIS — I4891 Unspecified atrial fibrillation: Secondary | ICD-10-CM | POA: Diagnosis not present

## 2021-11-30 DIAGNOSIS — E43 Unspecified severe protein-calorie malnutrition: Secondary | ICD-10-CM | POA: Diagnosis not present

## 2021-11-30 DIAGNOSIS — Z9181 History of falling: Secondary | ICD-10-CM | POA: Diagnosis not present

## 2021-12-01 ENCOUNTER — Other Ambulatory Visit: Payer: Self-pay | Admitting: Family Medicine

## 2021-12-02 DIAGNOSIS — K219 Gastro-esophageal reflux disease without esophagitis: Secondary | ICD-10-CM | POA: Diagnosis not present

## 2021-12-02 DIAGNOSIS — I110 Hypertensive heart disease with heart failure: Secondary | ICD-10-CM | POA: Diagnosis not present

## 2021-12-02 DIAGNOSIS — I951 Orthostatic hypotension: Secondary | ICD-10-CM | POA: Diagnosis not present

## 2021-12-02 DIAGNOSIS — I2510 Atherosclerotic heart disease of native coronary artery without angina pectoris: Secondary | ICD-10-CM | POA: Diagnosis not present

## 2021-12-02 DIAGNOSIS — D649 Anemia, unspecified: Secondary | ICD-10-CM | POA: Diagnosis not present

## 2021-12-02 DIAGNOSIS — Z7982 Long term (current) use of aspirin: Secondary | ICD-10-CM | POA: Diagnosis not present

## 2021-12-02 DIAGNOSIS — E785 Hyperlipidemia, unspecified: Secondary | ICD-10-CM | POA: Diagnosis not present

## 2021-12-02 DIAGNOSIS — M8580 Other specified disorders of bone density and structure, unspecified site: Secondary | ICD-10-CM | POA: Diagnosis not present

## 2021-12-02 DIAGNOSIS — R319 Hematuria, unspecified: Secondary | ICD-10-CM | POA: Diagnosis not present

## 2021-12-02 DIAGNOSIS — M722 Plantar fascial fibromatosis: Secondary | ICD-10-CM | POA: Diagnosis not present

## 2021-12-02 DIAGNOSIS — I4891 Unspecified atrial fibrillation: Secondary | ICD-10-CM | POA: Diagnosis not present

## 2021-12-02 DIAGNOSIS — G4700 Insomnia, unspecified: Secondary | ICD-10-CM | POA: Diagnosis not present

## 2021-12-02 DIAGNOSIS — N179 Acute kidney failure, unspecified: Secondary | ICD-10-CM | POA: Diagnosis not present

## 2021-12-02 DIAGNOSIS — Z9181 History of falling: Secondary | ICD-10-CM | POA: Diagnosis not present

## 2021-12-02 DIAGNOSIS — M47892 Other spondylosis, cervical region: Secondary | ICD-10-CM | POA: Diagnosis not present

## 2021-12-02 DIAGNOSIS — Z794 Long term (current) use of insulin: Secondary | ICD-10-CM | POA: Diagnosis not present

## 2021-12-02 DIAGNOSIS — E43 Unspecified severe protein-calorie malnutrition: Secondary | ICD-10-CM | POA: Diagnosis not present

## 2021-12-02 DIAGNOSIS — Z7901 Long term (current) use of anticoagulants: Secondary | ICD-10-CM | POA: Diagnosis not present

## 2021-12-02 DIAGNOSIS — Z86711 Personal history of pulmonary embolism: Secondary | ICD-10-CM | POA: Diagnosis not present

## 2021-12-02 DIAGNOSIS — I5020 Unspecified systolic (congestive) heart failure: Secondary | ICD-10-CM | POA: Diagnosis not present

## 2021-12-02 DIAGNOSIS — N281 Cyst of kidney, acquired: Secondary | ICD-10-CM | POA: Diagnosis not present

## 2021-12-02 DIAGNOSIS — Z7984 Long term (current) use of oral hypoglycemic drugs: Secondary | ICD-10-CM | POA: Diagnosis not present

## 2021-12-02 DIAGNOSIS — N12 Tubulo-interstitial nephritis, not specified as acute or chronic: Secondary | ICD-10-CM | POA: Diagnosis not present

## 2021-12-02 DIAGNOSIS — E1136 Type 2 diabetes mellitus with diabetic cataract: Secondary | ICD-10-CM | POA: Diagnosis not present

## 2021-12-04 DIAGNOSIS — E43 Unspecified severe protein-calorie malnutrition: Secondary | ICD-10-CM | POA: Diagnosis not present

## 2021-12-04 DIAGNOSIS — I951 Orthostatic hypotension: Secondary | ICD-10-CM | POA: Diagnosis not present

## 2021-12-04 DIAGNOSIS — R319 Hematuria, unspecified: Secondary | ICD-10-CM | POA: Diagnosis not present

## 2021-12-04 DIAGNOSIS — G4700 Insomnia, unspecified: Secondary | ICD-10-CM | POA: Diagnosis not present

## 2021-12-04 DIAGNOSIS — M47892 Other spondylosis, cervical region: Secondary | ICD-10-CM | POA: Diagnosis not present

## 2021-12-04 DIAGNOSIS — K219 Gastro-esophageal reflux disease without esophagitis: Secondary | ICD-10-CM | POA: Diagnosis not present

## 2021-12-04 DIAGNOSIS — M722 Plantar fascial fibromatosis: Secondary | ICD-10-CM | POA: Diagnosis not present

## 2021-12-04 DIAGNOSIS — Z7984 Long term (current) use of oral hypoglycemic drugs: Secondary | ICD-10-CM | POA: Diagnosis not present

## 2021-12-04 DIAGNOSIS — Z7982 Long term (current) use of aspirin: Secondary | ICD-10-CM | POA: Diagnosis not present

## 2021-12-04 DIAGNOSIS — I5020 Unspecified systolic (congestive) heart failure: Secondary | ICD-10-CM | POA: Diagnosis not present

## 2021-12-04 DIAGNOSIS — N179 Acute kidney failure, unspecified: Secondary | ICD-10-CM | POA: Diagnosis not present

## 2021-12-04 DIAGNOSIS — D649 Anemia, unspecified: Secondary | ICD-10-CM | POA: Diagnosis not present

## 2021-12-04 DIAGNOSIS — Z86711 Personal history of pulmonary embolism: Secondary | ICD-10-CM | POA: Diagnosis not present

## 2021-12-04 DIAGNOSIS — Z9181 History of falling: Secondary | ICD-10-CM | POA: Diagnosis not present

## 2021-12-04 DIAGNOSIS — I2510 Atherosclerotic heart disease of native coronary artery without angina pectoris: Secondary | ICD-10-CM | POA: Diagnosis not present

## 2021-12-04 DIAGNOSIS — E785 Hyperlipidemia, unspecified: Secondary | ICD-10-CM | POA: Diagnosis not present

## 2021-12-04 DIAGNOSIS — I4891 Unspecified atrial fibrillation: Secondary | ICD-10-CM | POA: Diagnosis not present

## 2021-12-04 DIAGNOSIS — N281 Cyst of kidney, acquired: Secondary | ICD-10-CM | POA: Diagnosis not present

## 2021-12-04 DIAGNOSIS — I110 Hypertensive heart disease with heart failure: Secondary | ICD-10-CM | POA: Diagnosis not present

## 2021-12-04 DIAGNOSIS — Z794 Long term (current) use of insulin: Secondary | ICD-10-CM | POA: Diagnosis not present

## 2021-12-04 DIAGNOSIS — E1136 Type 2 diabetes mellitus with diabetic cataract: Secondary | ICD-10-CM | POA: Diagnosis not present

## 2021-12-04 DIAGNOSIS — M8580 Other specified disorders of bone density and structure, unspecified site: Secondary | ICD-10-CM | POA: Diagnosis not present

## 2021-12-04 DIAGNOSIS — Z7901 Long term (current) use of anticoagulants: Secondary | ICD-10-CM | POA: Diagnosis not present

## 2021-12-04 DIAGNOSIS — N12 Tubulo-interstitial nephritis, not specified as acute or chronic: Secondary | ICD-10-CM | POA: Diagnosis not present

## 2021-12-05 DIAGNOSIS — Z7982 Long term (current) use of aspirin: Secondary | ICD-10-CM | POA: Diagnosis not present

## 2021-12-05 DIAGNOSIS — G4700 Insomnia, unspecified: Secondary | ICD-10-CM | POA: Diagnosis not present

## 2021-12-05 DIAGNOSIS — I951 Orthostatic hypotension: Secondary | ICD-10-CM | POA: Diagnosis not present

## 2021-12-05 DIAGNOSIS — M8580 Other specified disorders of bone density and structure, unspecified site: Secondary | ICD-10-CM | POA: Diagnosis not present

## 2021-12-05 DIAGNOSIS — E43 Unspecified severe protein-calorie malnutrition: Secondary | ICD-10-CM | POA: Diagnosis not present

## 2021-12-05 DIAGNOSIS — Z7984 Long term (current) use of oral hypoglycemic drugs: Secondary | ICD-10-CM | POA: Diagnosis not present

## 2021-12-05 DIAGNOSIS — Z9181 History of falling: Secondary | ICD-10-CM | POA: Diagnosis not present

## 2021-12-05 DIAGNOSIS — M47892 Other spondylosis, cervical region: Secondary | ICD-10-CM | POA: Diagnosis not present

## 2021-12-05 DIAGNOSIS — N12 Tubulo-interstitial nephritis, not specified as acute or chronic: Secondary | ICD-10-CM | POA: Diagnosis not present

## 2021-12-05 DIAGNOSIS — I5020 Unspecified systolic (congestive) heart failure: Secondary | ICD-10-CM | POA: Diagnosis not present

## 2021-12-05 DIAGNOSIS — D649 Anemia, unspecified: Secondary | ICD-10-CM | POA: Diagnosis not present

## 2021-12-05 DIAGNOSIS — R319 Hematuria, unspecified: Secondary | ICD-10-CM | POA: Diagnosis not present

## 2021-12-05 DIAGNOSIS — K219 Gastro-esophageal reflux disease without esophagitis: Secondary | ICD-10-CM | POA: Diagnosis not present

## 2021-12-05 DIAGNOSIS — I110 Hypertensive heart disease with heart failure: Secondary | ICD-10-CM | POA: Diagnosis not present

## 2021-12-05 DIAGNOSIS — Z794 Long term (current) use of insulin: Secondary | ICD-10-CM | POA: Diagnosis not present

## 2021-12-05 DIAGNOSIS — I2510 Atherosclerotic heart disease of native coronary artery without angina pectoris: Secondary | ICD-10-CM | POA: Diagnosis not present

## 2021-12-05 DIAGNOSIS — Z7901 Long term (current) use of anticoagulants: Secondary | ICD-10-CM | POA: Diagnosis not present

## 2021-12-05 DIAGNOSIS — N281 Cyst of kidney, acquired: Secondary | ICD-10-CM | POA: Diagnosis not present

## 2021-12-05 DIAGNOSIS — N179 Acute kidney failure, unspecified: Secondary | ICD-10-CM | POA: Diagnosis not present

## 2021-12-05 DIAGNOSIS — I4891 Unspecified atrial fibrillation: Secondary | ICD-10-CM | POA: Diagnosis not present

## 2021-12-05 DIAGNOSIS — M722 Plantar fascial fibromatosis: Secondary | ICD-10-CM | POA: Diagnosis not present

## 2021-12-05 DIAGNOSIS — Z86711 Personal history of pulmonary embolism: Secondary | ICD-10-CM | POA: Diagnosis not present

## 2021-12-05 DIAGNOSIS — E785 Hyperlipidemia, unspecified: Secondary | ICD-10-CM | POA: Diagnosis not present

## 2021-12-05 DIAGNOSIS — E1136 Type 2 diabetes mellitus with diabetic cataract: Secondary | ICD-10-CM | POA: Diagnosis not present

## 2021-12-10 DIAGNOSIS — N179 Acute kidney failure, unspecified: Secondary | ICD-10-CM | POA: Diagnosis not present

## 2021-12-10 DIAGNOSIS — M722 Plantar fascial fibromatosis: Secondary | ICD-10-CM | POA: Diagnosis not present

## 2021-12-10 DIAGNOSIS — I4891 Unspecified atrial fibrillation: Secondary | ICD-10-CM | POA: Diagnosis not present

## 2021-12-10 DIAGNOSIS — Z7982 Long term (current) use of aspirin: Secondary | ICD-10-CM | POA: Diagnosis not present

## 2021-12-10 DIAGNOSIS — R319 Hematuria, unspecified: Secondary | ICD-10-CM | POA: Diagnosis not present

## 2021-12-10 DIAGNOSIS — E1136 Type 2 diabetes mellitus with diabetic cataract: Secondary | ICD-10-CM | POA: Diagnosis not present

## 2021-12-10 DIAGNOSIS — N281 Cyst of kidney, acquired: Secondary | ICD-10-CM | POA: Diagnosis not present

## 2021-12-10 DIAGNOSIS — K219 Gastro-esophageal reflux disease without esophagitis: Secondary | ICD-10-CM | POA: Diagnosis not present

## 2021-12-10 DIAGNOSIS — I2510 Atherosclerotic heart disease of native coronary artery without angina pectoris: Secondary | ICD-10-CM | POA: Diagnosis not present

## 2021-12-10 DIAGNOSIS — G4700 Insomnia, unspecified: Secondary | ICD-10-CM | POA: Diagnosis not present

## 2021-12-10 DIAGNOSIS — I110 Hypertensive heart disease with heart failure: Secondary | ICD-10-CM | POA: Diagnosis not present

## 2021-12-10 DIAGNOSIS — E785 Hyperlipidemia, unspecified: Secondary | ICD-10-CM | POA: Diagnosis not present

## 2021-12-10 DIAGNOSIS — Z9181 History of falling: Secondary | ICD-10-CM | POA: Diagnosis not present

## 2021-12-10 DIAGNOSIS — I5020 Unspecified systolic (congestive) heart failure: Secondary | ICD-10-CM | POA: Diagnosis not present

## 2021-12-10 DIAGNOSIS — Z7901 Long term (current) use of anticoagulants: Secondary | ICD-10-CM | POA: Diagnosis not present

## 2021-12-10 DIAGNOSIS — M47892 Other spondylosis, cervical region: Secondary | ICD-10-CM | POA: Diagnosis not present

## 2021-12-10 DIAGNOSIS — I951 Orthostatic hypotension: Secondary | ICD-10-CM | POA: Diagnosis not present

## 2021-12-10 DIAGNOSIS — Z7984 Long term (current) use of oral hypoglycemic drugs: Secondary | ICD-10-CM | POA: Diagnosis not present

## 2021-12-10 DIAGNOSIS — Z794 Long term (current) use of insulin: Secondary | ICD-10-CM | POA: Diagnosis not present

## 2021-12-10 DIAGNOSIS — Z86711 Personal history of pulmonary embolism: Secondary | ICD-10-CM | POA: Diagnosis not present

## 2021-12-10 DIAGNOSIS — N12 Tubulo-interstitial nephritis, not specified as acute or chronic: Secondary | ICD-10-CM | POA: Diagnosis not present

## 2021-12-10 DIAGNOSIS — D649 Anemia, unspecified: Secondary | ICD-10-CM | POA: Diagnosis not present

## 2021-12-10 DIAGNOSIS — M8580 Other specified disorders of bone density and structure, unspecified site: Secondary | ICD-10-CM | POA: Diagnosis not present

## 2021-12-10 DIAGNOSIS — E43 Unspecified severe protein-calorie malnutrition: Secondary | ICD-10-CM | POA: Diagnosis not present

## 2021-12-11 ENCOUNTER — Other Ambulatory Visit: Payer: Self-pay | Admitting: Family Medicine

## 2021-12-11 DIAGNOSIS — I10 Essential (primary) hypertension: Secondary | ICD-10-CM

## 2021-12-11 DIAGNOSIS — E785 Hyperlipidemia, unspecified: Secondary | ICD-10-CM

## 2021-12-11 DIAGNOSIS — E1165 Type 2 diabetes mellitus with hyperglycemia: Secondary | ICD-10-CM

## 2021-12-12 DIAGNOSIS — E1136 Type 2 diabetes mellitus with diabetic cataract: Secondary | ICD-10-CM | POA: Diagnosis not present

## 2021-12-12 DIAGNOSIS — Z794 Long term (current) use of insulin: Secondary | ICD-10-CM | POA: Diagnosis not present

## 2021-12-12 DIAGNOSIS — G4700 Insomnia, unspecified: Secondary | ICD-10-CM | POA: Diagnosis not present

## 2021-12-12 DIAGNOSIS — M47892 Other spondylosis, cervical region: Secondary | ICD-10-CM | POA: Diagnosis not present

## 2021-12-12 DIAGNOSIS — R319 Hematuria, unspecified: Secondary | ICD-10-CM | POA: Diagnosis not present

## 2021-12-12 DIAGNOSIS — K219 Gastro-esophageal reflux disease without esophagitis: Secondary | ICD-10-CM | POA: Diagnosis not present

## 2021-12-12 DIAGNOSIS — N12 Tubulo-interstitial nephritis, not specified as acute or chronic: Secondary | ICD-10-CM | POA: Diagnosis not present

## 2021-12-12 DIAGNOSIS — Z86711 Personal history of pulmonary embolism: Secondary | ICD-10-CM | POA: Diagnosis not present

## 2021-12-12 DIAGNOSIS — I2510 Atherosclerotic heart disease of native coronary artery without angina pectoris: Secondary | ICD-10-CM | POA: Diagnosis not present

## 2021-12-12 DIAGNOSIS — I5020 Unspecified systolic (congestive) heart failure: Secondary | ICD-10-CM | POA: Diagnosis not present

## 2021-12-12 DIAGNOSIS — I4891 Unspecified atrial fibrillation: Secondary | ICD-10-CM | POA: Diagnosis not present

## 2021-12-12 DIAGNOSIS — Z7982 Long term (current) use of aspirin: Secondary | ICD-10-CM | POA: Diagnosis not present

## 2021-12-12 DIAGNOSIS — Z9181 History of falling: Secondary | ICD-10-CM | POA: Diagnosis not present

## 2021-12-12 DIAGNOSIS — I110 Hypertensive heart disease with heart failure: Secondary | ICD-10-CM | POA: Diagnosis not present

## 2021-12-12 DIAGNOSIS — Z7901 Long term (current) use of anticoagulants: Secondary | ICD-10-CM | POA: Diagnosis not present

## 2021-12-12 DIAGNOSIS — E43 Unspecified severe protein-calorie malnutrition: Secondary | ICD-10-CM | POA: Diagnosis not present

## 2021-12-12 DIAGNOSIS — D649 Anemia, unspecified: Secondary | ICD-10-CM | POA: Diagnosis not present

## 2021-12-12 DIAGNOSIS — N179 Acute kidney failure, unspecified: Secondary | ICD-10-CM | POA: Diagnosis not present

## 2021-12-12 DIAGNOSIS — N281 Cyst of kidney, acquired: Secondary | ICD-10-CM | POA: Diagnosis not present

## 2021-12-12 DIAGNOSIS — E785 Hyperlipidemia, unspecified: Secondary | ICD-10-CM | POA: Diagnosis not present

## 2021-12-12 DIAGNOSIS — Z7984 Long term (current) use of oral hypoglycemic drugs: Secondary | ICD-10-CM | POA: Diagnosis not present

## 2021-12-12 DIAGNOSIS — I951 Orthostatic hypotension: Secondary | ICD-10-CM | POA: Diagnosis not present

## 2021-12-12 DIAGNOSIS — M722 Plantar fascial fibromatosis: Secondary | ICD-10-CM | POA: Diagnosis not present

## 2021-12-12 DIAGNOSIS — M8580 Other specified disorders of bone density and structure, unspecified site: Secondary | ICD-10-CM | POA: Diagnosis not present

## 2021-12-19 DIAGNOSIS — I951 Orthostatic hypotension: Secondary | ICD-10-CM | POA: Diagnosis not present

## 2021-12-19 DIAGNOSIS — Z86711 Personal history of pulmonary embolism: Secondary | ICD-10-CM | POA: Diagnosis not present

## 2021-12-19 DIAGNOSIS — M47892 Other spondylosis, cervical region: Secondary | ICD-10-CM | POA: Diagnosis not present

## 2021-12-19 DIAGNOSIS — N12 Tubulo-interstitial nephritis, not specified as acute or chronic: Secondary | ICD-10-CM | POA: Diagnosis not present

## 2021-12-19 DIAGNOSIS — Z7982 Long term (current) use of aspirin: Secondary | ICD-10-CM | POA: Diagnosis not present

## 2021-12-19 DIAGNOSIS — Z794 Long term (current) use of insulin: Secondary | ICD-10-CM | POA: Diagnosis not present

## 2021-12-19 DIAGNOSIS — E43 Unspecified severe protein-calorie malnutrition: Secondary | ICD-10-CM | POA: Diagnosis not present

## 2021-12-19 DIAGNOSIS — Z7984 Long term (current) use of oral hypoglycemic drugs: Secondary | ICD-10-CM | POA: Diagnosis not present

## 2021-12-19 DIAGNOSIS — Z7901 Long term (current) use of anticoagulants: Secondary | ICD-10-CM | POA: Diagnosis not present

## 2021-12-19 DIAGNOSIS — R319 Hematuria, unspecified: Secondary | ICD-10-CM | POA: Diagnosis not present

## 2021-12-19 DIAGNOSIS — M8580 Other specified disorders of bone density and structure, unspecified site: Secondary | ICD-10-CM | POA: Diagnosis not present

## 2021-12-19 DIAGNOSIS — I2510 Atherosclerotic heart disease of native coronary artery without angina pectoris: Secondary | ICD-10-CM | POA: Diagnosis not present

## 2021-12-19 DIAGNOSIS — K219 Gastro-esophageal reflux disease without esophagitis: Secondary | ICD-10-CM | POA: Diagnosis not present

## 2021-12-19 DIAGNOSIS — G4700 Insomnia, unspecified: Secondary | ICD-10-CM | POA: Diagnosis not present

## 2021-12-19 DIAGNOSIS — I5020 Unspecified systolic (congestive) heart failure: Secondary | ICD-10-CM | POA: Diagnosis not present

## 2021-12-19 DIAGNOSIS — N281 Cyst of kidney, acquired: Secondary | ICD-10-CM | POA: Diagnosis not present

## 2021-12-19 DIAGNOSIS — D649 Anemia, unspecified: Secondary | ICD-10-CM | POA: Diagnosis not present

## 2021-12-19 DIAGNOSIS — M722 Plantar fascial fibromatosis: Secondary | ICD-10-CM | POA: Diagnosis not present

## 2021-12-19 DIAGNOSIS — I4891 Unspecified atrial fibrillation: Secondary | ICD-10-CM | POA: Diagnosis not present

## 2021-12-19 DIAGNOSIS — E785 Hyperlipidemia, unspecified: Secondary | ICD-10-CM | POA: Diagnosis not present

## 2021-12-19 DIAGNOSIS — E1136 Type 2 diabetes mellitus with diabetic cataract: Secondary | ICD-10-CM | POA: Diagnosis not present

## 2021-12-19 DIAGNOSIS — N179 Acute kidney failure, unspecified: Secondary | ICD-10-CM | POA: Diagnosis not present

## 2021-12-19 DIAGNOSIS — I110 Hypertensive heart disease with heart failure: Secondary | ICD-10-CM | POA: Diagnosis not present

## 2021-12-19 DIAGNOSIS — Z9181 History of falling: Secondary | ICD-10-CM | POA: Diagnosis not present

## 2021-12-24 DIAGNOSIS — I509 Heart failure, unspecified: Secondary | ICD-10-CM | POA: Diagnosis not present

## 2021-12-25 DIAGNOSIS — I110 Hypertensive heart disease with heart failure: Secondary | ICD-10-CM | POA: Diagnosis not present

## 2021-12-25 DIAGNOSIS — K219 Gastro-esophageal reflux disease without esophagitis: Secondary | ICD-10-CM | POA: Diagnosis not present

## 2021-12-25 DIAGNOSIS — M8580 Other specified disorders of bone density and structure, unspecified site: Secondary | ICD-10-CM | POA: Diagnosis not present

## 2021-12-25 DIAGNOSIS — Z7982 Long term (current) use of aspirin: Secondary | ICD-10-CM | POA: Diagnosis not present

## 2021-12-25 DIAGNOSIS — Z7984 Long term (current) use of oral hypoglycemic drugs: Secondary | ICD-10-CM | POA: Diagnosis not present

## 2021-12-25 DIAGNOSIS — N12 Tubulo-interstitial nephritis, not specified as acute or chronic: Secondary | ICD-10-CM | POA: Diagnosis not present

## 2021-12-25 DIAGNOSIS — I2510 Atherosclerotic heart disease of native coronary artery without angina pectoris: Secondary | ICD-10-CM | POA: Diagnosis not present

## 2021-12-25 DIAGNOSIS — Z794 Long term (current) use of insulin: Secondary | ICD-10-CM | POA: Diagnosis not present

## 2021-12-25 DIAGNOSIS — I4891 Unspecified atrial fibrillation: Secondary | ICD-10-CM | POA: Diagnosis not present

## 2021-12-25 DIAGNOSIS — M47892 Other spondylosis, cervical region: Secondary | ICD-10-CM | POA: Diagnosis not present

## 2021-12-25 DIAGNOSIS — I5020 Unspecified systolic (congestive) heart failure: Secondary | ICD-10-CM | POA: Diagnosis not present

## 2021-12-25 DIAGNOSIS — E43 Unspecified severe protein-calorie malnutrition: Secondary | ICD-10-CM | POA: Diagnosis not present

## 2021-12-25 DIAGNOSIS — Z7901 Long term (current) use of anticoagulants: Secondary | ICD-10-CM | POA: Diagnosis not present

## 2021-12-25 DIAGNOSIS — M722 Plantar fascial fibromatosis: Secondary | ICD-10-CM | POA: Diagnosis not present

## 2021-12-25 DIAGNOSIS — E1136 Type 2 diabetes mellitus with diabetic cataract: Secondary | ICD-10-CM | POA: Diagnosis not present

## 2021-12-25 DIAGNOSIS — R319 Hematuria, unspecified: Secondary | ICD-10-CM | POA: Diagnosis not present

## 2021-12-25 DIAGNOSIS — Z86711 Personal history of pulmonary embolism: Secondary | ICD-10-CM | POA: Diagnosis not present

## 2021-12-25 DIAGNOSIS — G4700 Insomnia, unspecified: Secondary | ICD-10-CM | POA: Diagnosis not present

## 2021-12-25 DIAGNOSIS — N281 Cyst of kidney, acquired: Secondary | ICD-10-CM | POA: Diagnosis not present

## 2021-12-25 DIAGNOSIS — I951 Orthostatic hypotension: Secondary | ICD-10-CM | POA: Diagnosis not present

## 2021-12-25 DIAGNOSIS — N179 Acute kidney failure, unspecified: Secondary | ICD-10-CM | POA: Diagnosis not present

## 2021-12-25 DIAGNOSIS — D649 Anemia, unspecified: Secondary | ICD-10-CM | POA: Diagnosis not present

## 2021-12-25 DIAGNOSIS — E785 Hyperlipidemia, unspecified: Secondary | ICD-10-CM | POA: Diagnosis not present

## 2021-12-25 DIAGNOSIS — Z9181 History of falling: Secondary | ICD-10-CM | POA: Diagnosis not present

## 2021-12-26 DIAGNOSIS — Z7982 Long term (current) use of aspirin: Secondary | ICD-10-CM | POA: Diagnosis not present

## 2021-12-26 DIAGNOSIS — Z7984 Long term (current) use of oral hypoglycemic drugs: Secondary | ICD-10-CM | POA: Diagnosis not present

## 2021-12-26 DIAGNOSIS — M722 Plantar fascial fibromatosis: Secondary | ICD-10-CM | POA: Diagnosis not present

## 2021-12-26 DIAGNOSIS — Z7901 Long term (current) use of anticoagulants: Secondary | ICD-10-CM | POA: Diagnosis not present

## 2021-12-26 DIAGNOSIS — M47892 Other spondylosis, cervical region: Secondary | ICD-10-CM | POA: Diagnosis not present

## 2021-12-26 DIAGNOSIS — I4891 Unspecified atrial fibrillation: Secondary | ICD-10-CM | POA: Diagnosis not present

## 2021-12-26 DIAGNOSIS — K219 Gastro-esophageal reflux disease without esophagitis: Secondary | ICD-10-CM | POA: Diagnosis not present

## 2021-12-26 DIAGNOSIS — E785 Hyperlipidemia, unspecified: Secondary | ICD-10-CM | POA: Diagnosis not present

## 2021-12-26 DIAGNOSIS — I2510 Atherosclerotic heart disease of native coronary artery without angina pectoris: Secondary | ICD-10-CM | POA: Diagnosis not present

## 2021-12-26 DIAGNOSIS — G4700 Insomnia, unspecified: Secondary | ICD-10-CM | POA: Diagnosis not present

## 2021-12-26 DIAGNOSIS — E1136 Type 2 diabetes mellitus with diabetic cataract: Secondary | ICD-10-CM | POA: Diagnosis not present

## 2021-12-26 DIAGNOSIS — Z794 Long term (current) use of insulin: Secondary | ICD-10-CM | POA: Diagnosis not present

## 2021-12-26 DIAGNOSIS — Z9181 History of falling: Secondary | ICD-10-CM | POA: Diagnosis not present

## 2021-12-26 DIAGNOSIS — N281 Cyst of kidney, acquired: Secondary | ICD-10-CM | POA: Diagnosis not present

## 2021-12-26 DIAGNOSIS — I5020 Unspecified systolic (congestive) heart failure: Secondary | ICD-10-CM | POA: Diagnosis not present

## 2021-12-26 DIAGNOSIS — I951 Orthostatic hypotension: Secondary | ICD-10-CM | POA: Diagnosis not present

## 2021-12-26 DIAGNOSIS — N179 Acute kidney failure, unspecified: Secondary | ICD-10-CM | POA: Diagnosis not present

## 2021-12-26 DIAGNOSIS — M8580 Other specified disorders of bone density and structure, unspecified site: Secondary | ICD-10-CM | POA: Diagnosis not present

## 2021-12-26 DIAGNOSIS — D649 Anemia, unspecified: Secondary | ICD-10-CM | POA: Diagnosis not present

## 2021-12-26 DIAGNOSIS — Z86711 Personal history of pulmonary embolism: Secondary | ICD-10-CM | POA: Diagnosis not present

## 2021-12-26 DIAGNOSIS — E43 Unspecified severe protein-calorie malnutrition: Secondary | ICD-10-CM | POA: Diagnosis not present

## 2021-12-26 DIAGNOSIS — N12 Tubulo-interstitial nephritis, not specified as acute or chronic: Secondary | ICD-10-CM | POA: Diagnosis not present

## 2021-12-26 DIAGNOSIS — I110 Hypertensive heart disease with heart failure: Secondary | ICD-10-CM | POA: Diagnosis not present

## 2021-12-26 DIAGNOSIS — R319 Hematuria, unspecified: Secondary | ICD-10-CM | POA: Diagnosis not present

## 2021-12-29 ENCOUNTER — Observation Stay (HOSPITAL_COMMUNITY)
Admission: EM | Admit: 2021-12-29 | Discharge: 2022-01-01 | Disposition: A | Discharge status: Home Health | Payer: Medicare Other | Attending: Family Medicine | Admitting: Family Medicine

## 2021-12-29 ENCOUNTER — Emergency Department (HOSPITAL_COMMUNITY): Payer: Medicare Other

## 2021-12-29 ENCOUNTER — Encounter (HOSPITAL_COMMUNITY): Payer: Self-pay | Admitting: Emergency Medicine

## 2021-12-29 ENCOUNTER — Other Ambulatory Visit: Payer: Self-pay

## 2021-12-29 DIAGNOSIS — Z86711 Personal history of pulmonary embolism: Secondary | ICD-10-CM | POA: Diagnosis not present

## 2021-12-29 DIAGNOSIS — R2689 Other abnormalities of gait and mobility: Secondary | ICD-10-CM | POA: Diagnosis not present

## 2021-12-29 DIAGNOSIS — Z7901 Long term (current) use of anticoagulants: Secondary | ICD-10-CM | POA: Insufficient documentation

## 2021-12-29 DIAGNOSIS — D649 Anemia, unspecified: Secondary | ICD-10-CM | POA: Diagnosis not present

## 2021-12-29 DIAGNOSIS — J90 Pleural effusion, not elsewhere classified: Secondary | ICD-10-CM | POA: Diagnosis not present

## 2021-12-29 DIAGNOSIS — Z853 Personal history of malignant neoplasm of breast: Secondary | ICD-10-CM | POA: Insufficient documentation

## 2021-12-29 DIAGNOSIS — Z79899 Other long term (current) drug therapy: Secondary | ICD-10-CM | POA: Diagnosis not present

## 2021-12-29 DIAGNOSIS — Z8541 Personal history of malignant neoplasm of cervix uteri: Secondary | ICD-10-CM | POA: Diagnosis not present

## 2021-12-29 DIAGNOSIS — Z20822 Contact with and (suspected) exposure to covid-19: Secondary | ICD-10-CM | POA: Insufficient documentation

## 2021-12-29 DIAGNOSIS — N644 Mastodynia: Secondary | ICD-10-CM | POA: Diagnosis not present

## 2021-12-29 DIAGNOSIS — I130 Hypertensive heart and chronic kidney disease with heart failure and stage 1 through stage 4 chronic kidney disease, or unspecified chronic kidney disease: Secondary | ICD-10-CM | POA: Diagnosis not present

## 2021-12-29 DIAGNOSIS — Z794 Long term (current) use of insulin: Secondary | ICD-10-CM | POA: Diagnosis not present

## 2021-12-29 DIAGNOSIS — I5023 Acute on chronic systolic (congestive) heart failure: Secondary | ICD-10-CM | POA: Insufficient documentation

## 2021-12-29 DIAGNOSIS — D72819 Decreased white blood cell count, unspecified: Secondary | ICD-10-CM | POA: Insufficient documentation

## 2021-12-29 DIAGNOSIS — E1122 Type 2 diabetes mellitus with diabetic chronic kidney disease: Secondary | ICD-10-CM | POA: Diagnosis not present

## 2021-12-29 DIAGNOSIS — R079 Chest pain, unspecified: Secondary | ICD-10-CM | POA: Diagnosis not present

## 2021-12-29 DIAGNOSIS — I2510 Atherosclerotic heart disease of native coronary artery without angina pectoris: Secondary | ICD-10-CM | POA: Diagnosis not present

## 2021-12-29 DIAGNOSIS — I509 Heart failure, unspecified: Secondary | ICD-10-CM

## 2021-12-29 DIAGNOSIS — I4891 Unspecified atrial fibrillation: Secondary | ICD-10-CM | POA: Insufficient documentation

## 2021-12-29 DIAGNOSIS — Z7982 Long term (current) use of aspirin: Secondary | ICD-10-CM | POA: Diagnosis not present

## 2021-12-29 DIAGNOSIS — R0602 Shortness of breath: Secondary | ICD-10-CM | POA: Diagnosis not present

## 2021-12-29 DIAGNOSIS — N1831 Chronic kidney disease, stage 3a: Secondary | ICD-10-CM | POA: Insufficient documentation

## 2021-12-29 LAB — DIFFERENTIAL
Abs Immature Granulocytes: 0 10*3/uL (ref 0.00–0.07)
Basophils Absolute: 0 10*3/uL (ref 0.0–0.1)
Basophils Relative: 1 %
Eosinophils Absolute: 0.1 10*3/uL (ref 0.0–0.5)
Eosinophils Relative: 3 %
Immature Granulocytes: 0 %
Lymphocytes Relative: 31 %
Lymphs Abs: 1.1 10*3/uL (ref 0.7–4.0)
Monocytes Absolute: 0.5 10*3/uL (ref 0.1–1.0)
Monocytes Relative: 12 %
Neutro Abs: 2 10*3/uL (ref 1.7–7.7)
Neutrophils Relative %: 53 %

## 2021-12-29 LAB — TROPONIN I (HIGH SENSITIVITY)
Troponin I (High Sensitivity): 29 ng/L — ABNORMAL HIGH (ref <18)
Troponin I (High Sensitivity): 23 ng/L — ABNORMAL HIGH (ref <18)

## 2021-12-29 LAB — RESP PANEL BY RT-PCR (FLU A&B, COVID) ARPGX2
Influenza A by PCR: NEGATIVE
Influenza B by PCR: NEGATIVE
SARS Coronavirus 2 by RT PCR: NEGATIVE

## 2021-12-29 LAB — HEPATIC FUNCTION PANEL
ALT: 15 U/L (ref 0–44)
AST: 23 U/L (ref 15–41)
Albumin: 3.3 g/dL — ABNORMAL LOW (ref 3.5–5.0)
Alkaline Phosphatase: 66 U/L (ref 38–126)
Bilirubin, Direct: 0.2 mg/dL (ref 0.0–0.2)
Indirect Bilirubin: 0.5 mg/dL (ref 0.3–0.9)
Total Bilirubin: 0.7 mg/dL (ref 0.3–1.2)
Total Protein: 6.8 g/dL (ref 6.5–8.1)

## 2021-12-29 LAB — BASIC METABOLIC PANEL
Anion gap: 10 (ref 5–15)
BUN: 11 mg/dL (ref 8–23)
CO2: 23 mmol/L (ref 22–32)
Calcium: 8.6 mg/dL — ABNORMAL LOW (ref 8.9–10.3)
Chloride: 106 mmol/L (ref 98–111)
Creatinine, Ser: 1.1 mg/dL — ABNORMAL HIGH (ref 0.44–1.00)
GFR, Estimated: 51 mL/min — ABNORMAL LOW (ref >60)
Glucose, Bld: 190 mg/dL — ABNORMAL HIGH (ref 70–99)
Potassium: 4.3 mmol/L (ref 3.5–5.1)
Sodium: 139 mmol/L (ref 135–145)

## 2021-12-29 LAB — CBC
HCT: 35.8 % — ABNORMAL LOW (ref 36.0–46.0)
Hemoglobin: 10.9 g/dL — ABNORMAL LOW (ref 12.0–15.0)
MCH: 29.4 pg (ref 26.0–34.0)
MCHC: 30.4 g/dL (ref 30.0–36.0)
MCV: 96.5 fL (ref 80.0–100.0)
Platelets: 161 10*3/uL (ref 150–400)
RBC: 3.71 MIL/uL — ABNORMAL LOW (ref 3.87–5.11)
RDW: 15.7 % — ABNORMAL HIGH (ref 11.5–15.5)
WBC: 3.8 10*3/uL — ABNORMAL LOW (ref 4.0–10.5)
nRBC: 0 % (ref 0.0–0.2)

## 2021-12-29 LAB — GLUCOSE, CAPILLARY: Glucose-Capillary: 162 mg/dL — ABNORMAL HIGH (ref 70–99)

## 2021-12-29 LAB — BRAIN NATRIURETIC PEPTIDE: B Natriuretic Peptide: >4500 pg/mL — ABNORMAL HIGH (ref 0.0–100.0)

## 2021-12-29 MED ORDER — PANTOPRAZOLE SODIUM 40 MG PO TBEC
40.0000 mg | DELAYED_RELEASE_TABLET | Freq: Every day | ORAL | Status: DC
  Administered 2021-12-30 – 2022-01-01 (×3): 40 mg via ORAL
  Filled 2021-12-29 (×3): qty 1

## 2021-12-29 MED ORDER — INSULIN ASPART 100 UNIT/ML IJ SOLN
0.0000 [IU] | Freq: Three times a day (TID) | INTRAMUSCULAR | Status: DC
  Administered 2021-12-30: 2 [IU] via SUBCUTANEOUS
  Administered 2021-12-30: 1 [IU] via SUBCUTANEOUS
  Administered 2021-12-31: 3 [IU] via SUBCUTANEOUS
  Administered 2022-01-01 (×2): 1 [IU] via SUBCUTANEOUS

## 2021-12-29 MED ORDER — APIXABAN 5 MG PO TABS
5.0000 mg | ORAL_TABLET | Freq: Two times a day (BID) | ORAL | Status: DC
  Administered 2021-12-29 – 2022-01-01 (×6): 5 mg via ORAL
  Filled 2021-12-29 (×6): qty 1

## 2021-12-29 MED ORDER — SACUBITRIL-VALSARTAN 49-51 MG PO TABS
1.0000 | ORAL_TABLET | Freq: Two times a day (BID) | ORAL | Status: DC
  Administered 2021-12-29 – 2022-01-01 (×6): 1 via ORAL
  Filled 2021-12-29 (×8): qty 1

## 2021-12-29 MED ORDER — FUROSEMIDE 10 MG/ML IJ SOLN
40.0000 mg | Freq: Once | INTRAMUSCULAR | Status: AC
Start: 2021-12-29 — End: 2021-12-29
  Administered 2021-12-29: 40 mg via INTRAVENOUS
  Filled 2021-12-29: qty 4

## 2021-12-29 MED ORDER — TRAZODONE HCL 50 MG PO TABS
50.0000 mg | ORAL_TABLET | Freq: Every evening | ORAL | Status: DC | PRN
  Administered 2021-12-29: 50 mg via ORAL
  Filled 2021-12-29: qty 1

## 2021-12-29 MED ORDER — ASPIRIN EC 81 MG PO TBEC
81.0000 mg | DELAYED_RELEASE_TABLET | Freq: Every morning | ORAL | Status: DC
  Administered 2021-12-30 – 2022-01-01 (×3): 81 mg via ORAL
  Filled 2021-12-29 (×4): qty 1

## 2021-12-29 MED ORDER — MELATONIN 3 MG PO TABS
6.0000 mg | ORAL_TABLET | Freq: Every day | ORAL | Status: DC
  Administered 2021-12-29 – 2021-12-31 (×3): 6 mg via ORAL
  Filled 2021-12-29 (×3): qty 2

## 2021-12-29 MED ORDER — ROSUVASTATIN CALCIUM 20 MG PO TABS
20.0000 mg | ORAL_TABLET | Freq: Every day | ORAL | Status: DC
  Administered 2021-12-29 – 2021-12-31 (×3): 20 mg via ORAL
  Filled 2021-12-29 (×3): qty 1

## 2021-12-29 MED ORDER — AMIODARONE HCL 200 MG PO TABS
100.0000 mg | ORAL_TABLET | Freq: Every day | ORAL | Status: DC
  Administered 2021-12-30 – 2022-01-01 (×3): 100 mg via ORAL
  Filled 2021-12-29 (×3): qty 1

## 2021-12-29 MED ORDER — METOPROLOL TARTRATE 12.5 MG HALF TABLET
12.5000 mg | ORAL_TABLET | Freq: Two times a day (BID) | ORAL | Status: DC
  Administered 2021-12-29 – 2021-12-30 (×2): 12.5 mg via ORAL
  Filled 2021-12-29 (×2): qty 1

## 2021-12-29 NOTE — ED Notes (Signed)
Pt family came up to me about pt  ?Pt is here  ?

## 2021-12-29 NOTE — ED Notes (Signed)
Called no answer and I do not see pt in lobby ?

## 2021-12-29 NOTE — ED Provider Notes (Signed)
Patient is a 81 year old female whose care was transferred me at shift change from [facility].  Please see her HPI below: ? ?81 year-old female with a history of breast cancer, hyperlipidemia, hypertension, CAD, DM type II, GERD, PE, NSTEMI, CHF, empyema s/p PEG, pyelonephritis presents to the ER with complaints of shortness of breath which has been ongoing for the last week.  Patient has noted bilateral lower extremity edema and swelling of her abdomen over the last week.  Reports shortness of breath on ambulation while lying back.  She has been followed with primary care for her heart failure and was recently started on 20 mg of Lasix.  Over the last 2 weeks her Lasix has been increased to 40 mg and has not been helping. ? ?Echocardiogram on May 21, 2021 shows a left ventricular ejection fraction of 25% to 30%. ?Physical Exam  ?BP (!) 143/85 (BP Location: Left Arm)   Pulse 61   Temp 97.9 ?F (36.6 ?C) (Oral)   Resp 20   Ht '5\' 2"'$  (1.575 m)   Wt 58.5 kg   SpO2 98%   BMI 23.59 kg/m?  ? ?Physical Exam ?Vitals and nursing note reviewed.  ?Constitutional:   ?   General: She is not in acute distress. ?   Appearance: She is well-developed and normal weight.  ?HENT:  ?   Head: Normocephalic and atraumatic.  ?   Right Ear: External ear normal.  ?   Left Ear: External ear normal.  ?Eyes:  ?   General: No scleral icterus.    ?   Right eye: No discharge.     ?   Left eye: No discharge.  ?   Conjunctiva/sclera: Conjunctivae normal.  ?Neck:  ?   Trachea: No tracheal deviation.  ?Cardiovascular:  ?   Rate and Rhythm: Normal rate. No extrasystoles are present. ?   Pulses: No decreased pulses.  ?   Heart sounds: No murmur heard. ?  No friction rub. No gallop.  ?Pulmonary:  ?   Effort: Pulmonary effort is normal. No tachypnea, bradypnea, accessory muscle usage or respiratory distress.  ?   Breath sounds: Normal breath sounds. No stridor. No decreased breath sounds, wheezing, rhonchi or rales.  ?Abdominal:  ?   General:  There is no distension.  ?Musculoskeletal:     ?   General: No swelling or deformity.  ?   Cervical back: Neck supple.  ?   Right lower leg: Edema present.  ?   Left lower leg: Edema present.  ?   Comments: Trace pedal edema.  ?Skin: ?   General: Skin is warm and dry.  ?   Findings: No rash.  ?Neurological:  ?   Mental Status: She is alert.  ?   Cranial Nerves: Cranial nerve deficit: no gross deficits.  ? ? ?Procedures  ?Procedures ? ?ED Course / MDM  ?  ?Medical Decision Making ?Amount and/or Complexity of Data Reviewed ?Labs: ordered. ?Radiology: ordered. ? ?Risk ?Prescription drug management. ? ?Patient is a 81 year old female whose care was transferred to me at shift change from [facility].  Please see her note for additional information.  In summary, patient has a history of CHF and presents to the emergency department due to shortness of breath.  She was found to have mild pitting edema in the lower extremities as well as some mild abdominal distention. ? ?At the time of shift change patient pending lab work as well as reassessment after receiving IV Lasix.  BC  has resulted with a white count of 3.8 and a hemoglobin of 10.9.  BMP with a creatinine of 1.1 and a GFR 51.  Appears similar to the patient's baseline.  BNP appears significantly elevated greater than 4500.  Patient's most recent value was 3647.8 which was 5 months ago.  BNP appears to be increasing over the past 7 months.  Troponin elevated at 29.  Appears lower when compared to patient's previous values.  Respiratory panel is negative. ? ?Patient ambulated with pulse ox and saturations remained around 93% to 95%.  She endorsed worsening shortness of breath.  Feel the patient would benefit from admission as well as further diuresis given her sx & worsening BNP.  This was discussed with the patient and she is agreeable.  We will discuss with the medicine team. ? ? ? ? ? ? ?  ?Rayna Sexton, PA-C ?12/29/21 1817 ? ?  ?Lacretia Leigh, MD ?01/01/22  [phone] ? ?

## 2021-12-29 NOTE — H&P (Addendum)
Family Medicine Teaching Service ?Hospital Admission History and Physical ?Service Pager: [phone] ? ?Patient name: Dana Coleman Medical record number: [id number] ?Date of birth: [birth year] Age: 81 y.o. Gender: female ? ?Primary Care Provider: Gifford Shave, MD ?Consultants: None ?Code Status: Full ?Preferred Emergency Contact: Dana Coleman (Sister) [phone] ? ?Chief Complaint: Shortness of Breath ? ?Assessment and Plan: ?Dana Coleman is a 81 y.o. female presenting with shortness of breath . PMH is significant for HFrEF, T2DM, insomnia, Afib with RVR on eliquis, HLD, HTN, CAD, empyema s/p PEG, pyelonephritis and GERD.  ? ?Shortness of Breath likely 2/2 acute CHF exacerbation ?HFrEF LVEF 25-30% with regional wall motion abnormality ?Patient presented to ED due to a couple weeks of progressive dyspnea. Patient was off lasix due to pyelonephritis but was resumed after noticing Patient had taken Lasix 20 mg qd but the increased to 40 mg qd but this has not helped shortness of breath. In the ED, BNP >4,500, VSS besides mild tachypnea but saturating well on room air. Troponin 29> 23. CXR shows new small left and trace right pleural effusions.  Patient was diuresed with IV Lasix 40 mg with measured UOP 1.45 L. Dry weight ~56 kg. Currently 58.5 kg. Last echo 05/21/21 LVEF 25-30%, RVSP 42.6 mmHg. Reports breathing has improved after IV lasix 40. Was able to ambulate in hallway with walker with O2 sat 93-95% on RA, HR 65, mild dyspnea. Given improvement with lasix and elevated BNP, likely CHF exacerbation. Unlikely infectious etiology given afebrile, VSS and leukopenia.  ?Home med: amiodarone 100 mg qd, metoprolol 12.5 mg qd, entresto 24-26 mg, spironolactone 12.5 mg qd, lasix po 40 mg qd ?-Admission to [place], attending Dr. Ardelia Mems, telemed ?-Vital signs per floor ?-Strict I's & O's  ?-Daily Weights ?-Resumed home meds: amiodarone, metoprolol, entresto ?-Holding home spironolactone, po lasix ?-Re-dose lasix in  the am ?-Echo ordered ?-PT/OT Eval and treat ? ?Hypertension ?SBP 120s-140s/70s-80s.  ?Home med: Entresto 24-'26mg'$ , metoprolol 12.5 mg qd, aldactone 12.5 mg qd ?-Holding aldactone ?-Resuming entresto, metoprolol ? ?Leukopenia ?Relatively stable. WBC 4.4 (1 month ago)>3.8. ?-AM CBC ?-Continue to monitor ? ?Hx of A fib with RVR  hx of PE ?HR has been stable in 50s-60s. Home med: Eliquis 5 mg bid ?-Resume home med Eliquis ?-Also on ASA. Can consider d/c.  ? ?CKD stage 3a, stable ?Chronic Normocytic Anemia ?Cr relatively stable at 1.10 (11/11/21 1.03). Hgb 10.9 (baseline ~10) likely 2/2 to CKD. ?-AM CBC ?-AM BMP  ? ?HLD ?Home med: crestor 20 mg ?-Resume home med ? ?GERD ?Home med: prilosec 20 mg ?-Start protonix 40 mg qd ? ?Hx of CAD ?Home med: ASA 81 mg ?-Resume home med  ? ?T2DM, stable ?Home med: 6 units lantus.  ?-Very sensitive SSI with CBG during meals, qhs, and ac ?-Holding home meds ? ?Insomnia ?Home med: melatonin, trazodone prn ?-Resume home meds ? ?FEN/GI: Regular ?Prophylaxis: Eliquis ? ?Disposition: Pending PT/OT eval.  ? ?History of Present Illness:  Dana Coleman is a 81 y.o. female presenting with exertional dyspnea for past 2 weeks and swelling and legs and feet.  Patient has also had worsening orthopnea (increase from 2 to 3 pillows). Patient was initially not taking Lasix due to recent hospitalization of pyelonephritis; however, after speaking with outpatient cardiologist, she was resumed on Lasix 20 mg and then increased to 40 mg which pt reports did not help with dyspnea. Cardiologist recommended going to ED if dyspnea and edema did not improve.  Patient denies chest pain, nausea, vomiting. Reports  regular compliance with home medications. Currently feels that dyspnea has improved and feels leg swelling has gone down. ? ?Patient denies alcohol, tobacco, illicit substance use.  ? ?Review Of Systems: Per HPI with the following additions:  ? ?Review of Systems  ?Constitutional:  Negative for fever.   ?HENT:  Negative for congestion and sore throat.   ?Respiratory:  Positive for shortness of breath. Negative for cough.   ?Gastrointestinal:  Negative for abdominal distention, nausea and vomiting.  ?Genitourinary:  Negative for difficulty urinating and dysuria.  ?Neurological:  Negative for weakness, numbness and headaches.   ? ?Patient Active Problem List  ? Diagnosis Date Noted  ? Pyelonephritis 11/06/2021  ? Hematuria 11/06/2021  ? UTI (urinary tract infection) 11/05/2021  ? Dysuria 10/25/2021  ? Gastrostomy tube in place [facility]) 08/21/2021  ? PEG (percutaneous endoscopic gastrostomy) status ([place]) 07/23/2021  ? Failure to thrive in adult 07/22/2021  ? Abdominal pain   ? Dyspnea   ? On enteral nutrition 05/25/2021  ? Impaired functional mobility, balance, gait, and endurance 05/25/2021  ? S/P thoracotomy   ? NSTEMI (non-ST elevated myocardial infarction) ([place]) 05/23/2021  ? Heart failure with reduced ejection fraction ([place]) 05/23/2021  ? Empyema ([place])   ? Acute pulmonary embolism ([place])   ? Acute respiratory failure with hypoxemia (H[facility])   ? Acute pulmonary edema (H[facility])   ? Acute respiratory failure with hypoxia ([place])   ? Acute renal failure ([place])   ? Hydropneumothorax   ? Protein-calorie malnutrition, severe 05/15/2021  ? Pressure injury of skin 05/05/2021  ? Esophageal perforation 05/05/2021  ? GERD (gastroesophageal reflux disease) 12/19/2020  ? Breast lump 12/07/2018  ? Type 2 diabetes mellitus with other specified complication ([place]) 29/79/8921  ? Debility 06/30/2011  ? History of thyroid nodule 01/16/2011  ? History of breast cancer (right)  stage 1 ERA positive   ? History of cervical cancer   ? Hyperlipidemia   ? Hypertension   ? CAD (coronary artery disease), native coronary artery   ? ? ?Past Medical History: ?Past Medical History:  ?Diagnosis Date  ? Allergy   ? occ uses OTC allergy meds   ? Arthritis   ? fingers  ? Breast cancer ([place]) 04/2008  ? Right breast  ? Cataracts, bilateral   ? removed bilat   ?  Cervical cancer ([place])   ? When the patient was in her 63s  ? Depression   ? Diabetes mellitus without complication ([place])   ? Heart attack [facility]) 2004  ? 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 Hyperlipidemia   ? Hypertension   ? Osteopenia 12/10/2006  ? On vitamin D per oncology Dr. Truddie Coco  ? PEG (percutaneous endoscopic gastrostomy) status ([place]) 07/23/2021  ? Personal history of radiation therapy 2009  ? Respiratory failure ([place]) 05/04/2021  ? ? ?Past Surgical History: ?Past Surgical History:  ?Procedure Laterality Date  ? BREAST LUMPECTOMY Right 2009  ? BREAST SURGERY  2009  ? right lumpectomy  ? CERVIX SURGERY  1980  ? CHOLECYSTECTOMY N/A 05/02/2021  ? Procedure: LAPAROSCOPIC CHOLECYSTECTOMY;  Surgeon: Greer Pickerel, MD;  Location: [facility] ORS;  Service: General;  Laterality: N/A;  ? COLONOSCOPY    ? IR GASTROSTOMY TUBE MOD SED  06/03/2021  ? IR GASTROSTOMY TUBE REMOVAL  10/08/2021  ? LEFT HEART CATHETERIZATION WITH CORONARY ANGIOGRAM N/A 04/20/2014  ? Procedure: LEFT HEART CATHETERIZATION WITH CORONARY ANGIOGRAM;  Surgeon: Jacolyn Reedy, MD;  Location: [facility] CATH LAB;  Service: Cardiovascular;  Laterality: N/A;  ? POLYPECTOMY    ?  STENT PLACEMENT VASCULAR ([facility] HX)    ? THORACOTOMY Right 05/05/2021  ? Procedure: THORACOTOMY MAJOR REPAIR PERFORATED ESOPHAGUS;  Surgeon: Gaye Pollack, MD;  Location: [facility] OR;  Service: Thoracic;  Laterality: Right;  ? thyroid    ? for nodule hemithyroidectomy, benign  ? THYROIDECTOMY Right 03/2003  ? ? ?Social History: ?Social History  ? ?Tobacco Use  ? Smoking status: Never  ? Smokeless tobacco: Never  ? Tobacco comments:  ?  No plans to start  ?Vaping Use  ? Vaping Use: Never used  ?Substance Use Topics  ? Alcohol use: No  ? Drug use: No  ? ?Additional social history:   ?Please also refer to relevant sections of EMR. ? ?Family History: ?Family History  ?Problem Relation Age of Onset  ? Heart disease Mother   ?     age 1  ? Heart disease Father   ? Cancer Father   ?     unknown  ? Heart disease  Brother   ? Hypertension Brother   ? Cirrhosis Brother   ? Diabetes Daughter   ? Stroke Daughter   ? Kidney disease Daughter   ? Hypertension Son   ? Colon cancer Neg Hx   ? Colon polyps Neg Hx   ? Esophageal

## 2021-12-29 NOTE — ED Provider Notes (Signed)
?Dana Coleman ?Provider Note ? ? ?CSN: [id number] ?Arrival date & time: 12/29/21  1200 ? ?  ? ?History ? ?Chief Complaint  ?Patient presents with  ? Shortness of Breath  ? ? ?Dana Coleman is a 81 y.o. female. ? ?HPI ?81 year-old female with a history of breast cancer, hyperlipidemia, hypertension, CAD, DM type II, GERD, PE, NSTEMI, CHF, empyema s/p PEG, pyelonephritis presents to the ER with complaints of shortness of breath which has been ongoing for the last week.  Patient has noted bilateral lower extremity edema and swelling of her abdomen over the last week.  Reports shortness of breath on ambulation while lying back.  She has been followed with primary care for her heart failure and was recently started on 20 mg of Lasix.  Over the last 2 weeks her Lasix has been increased to 40 mg and has not been helping. ?  ? ?Home Medications ?Prior to Admission medications   ?Medication Sig Start Date End Date Taking? Authorizing Provider  ?acetaminophen (TYLENOL) 500 MG tablet Take 500 mg by mouth every 6 (six) hours as needed for moderate pain or headache.    [provider]  ?amiodarone (PACERONE) 100 MG tablet Take 1 tablet (100 mg total) by mouth daily. 07/26/21   Carollee Leitz, MD  ?aspirin EC 81 MG tablet Take 81 mg by mouth in the morning. Swallow whole.    [provider]  ?Blood Glucose Monitoring Suppl MISC daily.    [provider]  ?ELIQUIS 5 MG TABS tablet TAKE 1 TABLET BY MOUTH TWICE A DAY 12/11/21   Gifford Shave, MD  ?glucose blood (ONE TOUCH ULTRA TEST) test strip CHECK BLOOD SUGAR TWICE DAILY ?Patient taking differently: daily. 09/24/18   Everrett Coombe, MD  ?insulin glargine (LANTUS SOLOSTAR) 100 UNIT/ML Solostar Pen Inject 6 Units into the skin daily.    [provider]  ?Insulin Pen Needle (B-D UF III MINI PEN NEEDLES) 31G X 5 MM MISC INJECT INSULIN VIA PEN 6 TIMES DAILY 02/22/18   Everrett Coombe, MD  ?Melatonin 3 MG TBDP Take 6  mg by mouth at bedtime. 07/20/21   [provider]  ?metoprolol tartrate (LOPRESSOR) 25 MG tablet Take 0.5 tablets (12.5 mg total) by mouth 2 (two) times daily. 11/08/21   Autry-Lott, Naaman Plummer, DO  ?nitroGLYCERIN (NITROSTAT) 0.4 MG SL tablet Place 1 tablet (0.4 mg total) under the tongue every 5 (five) minutes as needed for chest pain. 07/12/19   Zenia Resides, MD  ?omeprazole (PRILOSEC) 20 MG capsule Take 40 mg by mouth every morning. 12/12/20   [provider]  ?ondansetron (ZOFRAN) 4 MG tablet Take 1 tablet (4 mg total) by mouth daily as needed for nausea or vomiting. 06/17/21 06/17/22  Donney Dice, DO  ?OneTouch Delica Lancets 41D MISC 1 Container by Does not apply route as needed. ?Patient taking differently: 1 Container by Does not apply route daily. 09/12/21   Zenia Resides, MD  ?rosuvastatin (CRESTOR) 20 MG tablet TAKE 1 TABLET BY MOUTH EVERYDAY AT BEDTIME 12/11/21   Gifford Shave, MD  ?sacubitril-valsartan (ENTRESTO) 24-26 MG Take 1 tablet by mouth 2 (two) times daily. 12/02/21   Gifford Shave, MD  ?spironolactone (ALDACTONE) 25 MG tablet TAKE 1/2 TABLET BY MOUTH EVERY DAY 12/11/21   Gifford Shave, MD  ?traZODone (DESYREL) 50 MG tablet Take 50 mg by mouth at bedtime as needed for sleep. 07/20/21   [provider]  ?   ? ?Allergies    ?  Patient has no known allergies.   ? ?Review of Systems   ?Review of Systems ?Ten systems reviewed and are negative for acute change, except as noted in the HPI.  ? ?Physical Exam ?Updated Vital Signs ?BP (!) 143/85 (BP Location: Left Arm)   Pulse 61   Temp 97.9 ?F (36.6 ?C) (Oral)   Resp 20   Ht '5\' 2"'$  (1.575 m)   Wt 58.5 kg   SpO2 98%   BMI 23.59 kg/m?  ?Physical Exam ?Vitals and nursing note reviewed.  ?Constitutional:   ?   General: She is not in acute distress. ?   Appearance: She is well-developed.  ?HENT:  ?   Head: Normocephalic and atraumatic.  ?Eyes:  ?   Conjunctiva/sclera: Conjunctivae normal.  ?Cardiovascular:  ?   Rate and Rhythm:  Normal rate and regular rhythm.  ?   Heart sounds: No murmur heard. ?Pulmonary:  ?   Effort: Pulmonary effort is normal. No respiratory distress.  ?   Breath sounds: Normal breath sounds.  ?Abdominal:  ?   Palpations: Abdomen is soft.  ?   Tenderness: There is no abdominal tenderness.  ?   Comments: Mild abdominal distension, abdomen soft and nontender   ?Musculoskeletal:     ?   General: No swelling.  ?   Cervical back: Neck supple.  ?   Comments: 1+ LE edema bilaterally. No warmth, erythema, unilateral swelling ?  ?Skin: ?   General: Skin is warm and dry.  ?   Capillary Refill: Capillary refill takes less than 2 seconds.  ?Neurological:  ?   Mental Status: She is alert.  ?Psychiatric:     ?   Mood and Affect: Mood normal.  ? ? ?ED Results / Procedures / Treatments   ?Labs ?(all labs ordered are listed, but only abnormal results are displayed) ?Labs Reviewed  ?BASIC METABOLIC PANEL - Abnormal; Notable for the following components:  ?    Result Value  ? Glucose, Bld 190 (*)   ? Creatinine, Ser 1.10 (*)   ? Calcium 8.6 (*)   ? GFR, Estimated 51 (*)   ? All other components within normal limits  ?CBC - Abnormal; Notable for the following components:  ? WBC 3.8 (*)   ? RBC 3.71 (*)   ? Hemoglobin 10.9 (*)   ? HCT 35.8 (*)   ? RDW 15.7 (*)   ? All other components within normal limits  ?HEPATIC FUNCTION PANEL - Abnormal; Notable for the following components:  ? Albumin 3.3 (*)   ? All other components within normal limits  ?RESP PANEL BY RT-PCR (FLU A&B, COVID) ARPGX2  ?DIFFERENTIAL  ?CBC WITH DIFFERENTIAL/PLATELET  ?BRAIN NATRIURETIC PEPTIDE  ?TROPONIN I (HIGH SENSITIVITY)  ? ? ?EKG ?None ? ?Radiology ?DG Chest 2 View ? ?Result Date: 12/29/2021 ?CLINICAL DATA:  Shortness of breath.  Left breast pain. EXAM: CHEST - 2 VIEW COMPARISON:  AP chest 11/05/2021 FINDINGS: Cardiac silhouette is again moderately to markedly enlarged. Tortuous thoracic aorta is again noted. Mild calcification within aortic arch. New small left and  trace right pleural effusions with associated basilar linear opacities. No pneumothorax. Surgical clips overlie the superomedial right hemithorax/neck base. Moderate multilevel degenerative disc changes of the thoracic spine. IMPRESSION: New small left and trace right pleural effusions with associated bibasilar atelectasis versus infection. Electronically Signed   By: Yvonne Kendall M.D.   On: 12/29/2021 13:43   ? ?Procedures ?Procedures  ? ? ?Medications Ordered in ED ?Medications  ?furosemide (LASIX)  injection 40 mg (40 mg Intravenous Given 12/29/21 1505)  ? ? ?ED Course/ Medical Decision Making/ A&P ?  ?                        ?Medical Decision Making ?Amount and/or Complexity of Data Reviewed ?Labs: ordered. ?Radiology: ordered. ? ? ?81 year old female who presents with shortness of breath.  On arrival, she is well-appearing, no acute distress, speaking full sentences, on room air.  Physical exam with 1+ lower extremity edema with some mild abdominal distention, abdomen is soft and nontender.  Vitals overall reassuring, no evidence of hypoxia or tachycardia.  BMP with mildly elevated creatinine 1.10, glucose of 190.  No anion gap, normal CO2.  CBC with a leukopenia of 3.8.  Chest x-ray with right pleural effusion, question edema versus pneumonia.  Adding on BNP, differential, troponin.  Patient was given 40 mg of IV Lasix.  Will need ambulation/evaluation for further disposition.  Care signed out to [facility] who will oversee the rest of her care and dispo accordingly. ?Final Clinical Impression(s) / ED Diagnoses ?Final diagnoses:  ?None  ? ? ?Rx / DC Orders ?ED Discharge Orders   ? ? None  ? ?  ? ? ?  ?Garald Balding, PA-C ?12/29/21 1526 ? ?  ?Tegeler, Gwenyth Allegra, MD ?12/29/21 1539 ? ?

## 2021-12-29 NOTE — ED Notes (Signed)
Ambulated pt in hallway with walker. Pt has slow, steady and even gait. Pt states have slight shob. HR 65 o2 sat 93-95% on room air. Pt back in on stretcher with tele. ?

## 2021-12-29 NOTE — ED Triage Notes (Signed)
Patient complains of left breast pain that started a couple of days ago, also complains of shortness of breath and states she was recently started on a diuretic for CHF. Patient speaking in complete sentences and is in no apparent distress at this time. ?

## 2021-12-30 ENCOUNTER — Encounter (HOSPITAL_COMMUNITY): Payer: Self-pay | Admitting: Student

## 2021-12-30 ENCOUNTER — Observation Stay (HOSPITAL_BASED_OUTPATIENT_CLINIC_OR_DEPARTMENT_OTHER): Payer: Medicare Other

## 2021-12-30 ENCOUNTER — Other Ambulatory Visit: Payer: Self-pay

## 2021-12-30 DIAGNOSIS — I509 Heart failure, unspecified: Secondary | ICD-10-CM

## 2021-12-30 DIAGNOSIS — I340 Nonrheumatic mitral (valve) insufficiency: Secondary | ICD-10-CM | POA: Diagnosis not present

## 2021-12-30 DIAGNOSIS — I361 Nonrheumatic tricuspid (valve) insufficiency: Secondary | ICD-10-CM | POA: Diagnosis not present

## 2021-12-30 DIAGNOSIS — I130 Hypertensive heart and chronic kidney disease with heart failure and stage 1 through stage 4 chronic kidney disease, or unspecified chronic kidney disease: Secondary | ICD-10-CM | POA: Diagnosis not present

## 2021-12-30 LAB — ECHOCARDIOGRAM COMPLETE
AR max vel: 2.14 cm2
AV Area VTI: 2.17 cm2
AV Area mean vel: 2.3 cm2
AV Mean grad: 3 mmHg
AV Peak grad: 6.5 mmHg
Ao pk vel: 1.27 m/s
Area-P 1/2: 2.32 cm2
Height: 62 in
S' Lateral: 4.3 cm
Weight: 1996.49 oz

## 2021-12-30 LAB — CBC
HCT: 32.8 % — ABNORMAL LOW (ref 36.0–46.0)
Hemoglobin: 10.9 g/dL — ABNORMAL LOW (ref 12.0–15.0)
MCH: 30.2 pg (ref 26.0–34.0)
MCHC: 33.2 g/dL (ref 30.0–36.0)
MCV: 90.9 fL (ref 80.0–100.0)
Platelets: 153 10*3/uL (ref 150–400)
RBC: 3.61 MIL/uL — ABNORMAL LOW (ref 3.87–5.11)
RDW: 15.6 % — ABNORMAL HIGH (ref 11.5–15.5)
WBC: 4.2 10*3/uL (ref 4.0–10.5)
nRBC: 0 % (ref 0.0–0.2)

## 2021-12-30 LAB — GLUCOSE, CAPILLARY
Glucose-Capillary: 107 mg/dL — ABNORMAL HIGH (ref 70–99)
Glucose-Capillary: 177 mg/dL — ABNORMAL HIGH (ref 70–99)
Glucose-Capillary: 216 mg/dL — ABNORMAL HIGH (ref 70–99)
Glucose-Capillary: 184 mg/dL — ABNORMAL HIGH (ref 70–99)

## 2021-12-30 LAB — BASIC METABOLIC PANEL
Anion gap: 8 (ref 5–15)
BUN: 9 mg/dL (ref 8–23)
CO2: 27 mmol/L (ref 22–32)
Calcium: 8.7 mg/dL — ABNORMAL LOW (ref 8.9–10.3)
Chloride: 106 mmol/L (ref 98–111)
Creatinine, Ser: 1.11 mg/dL — ABNORMAL HIGH (ref 0.44–1.00)
GFR, Estimated: 51 mL/min — ABNORMAL LOW (ref >60)
Glucose, Bld: 146 mg/dL — ABNORMAL HIGH (ref 70–99)
Potassium: 3.3 mmol/L — ABNORMAL LOW (ref 3.5–5.1)
Sodium: 141 mmol/L (ref 135–145)

## 2021-12-30 MED ORDER — FUROSEMIDE 40 MG PO TABS
80.0000 mg | ORAL_TABLET | Freq: Once | ORAL | Status: AC
  Administered 2021-12-30: 80 mg via ORAL
  Filled 2021-12-30: qty 2

## 2021-12-30 MED ORDER — METOPROLOL TARTRATE 12.5 MG HALF TABLET
12.5000 mg | ORAL_TABLET | Freq: Once | ORAL | Status: AC
  Administered 2021-12-30: 12.5 mg via ORAL
  Filled 2021-12-30: qty 1

## 2021-12-30 MED ORDER — POTASSIUM CHLORIDE 20 MEQ PO PACK: 40.0000 meq | PACK | Freq: Two times a day (BID) | ORAL | Status: DC

## 2021-12-30 MED ORDER — METOPROLOL SUCCINATE ER 25 MG PO TB24
25.0000 mg | ORAL_TABLET | Freq: Every day | ORAL | Status: DC
  Administered 2021-12-31 – 2022-01-01 (×2): 25 mg via ORAL
  Filled 2021-12-30 (×2): qty 1

## 2021-12-30 MED ORDER — POTASSIUM CHLORIDE 20 MEQ PO PACK
40.0000 meq | PACK | Freq: Two times a day (BID) | ORAL | Status: AC
  Administered 2021-12-30 (×2): 40 meq via ORAL
  Filled 2021-12-30 (×2): qty 2

## 2021-12-30 MED ORDER — FUROSEMIDE 40 MG PO TABS
60.0000 mg | ORAL_TABLET | Freq: Once | ORAL | Status: AC
  Administered 2021-12-30: 60 mg via ORAL
  Filled 2021-12-30: qty 1

## 2021-12-30 NOTE — Progress Notes (Signed)
Family Medicine Teaching Service ?Daily Progress Note ?Intern Pager: [phone] ? ?Patient name: Dana Coleman Medical record number: [id number] ?Date of birth: 1941/06/06 Age: 81 y.o. Gender: female ? ?Primary Care Provider: Gifford Shave, MD ?Consultants: None ?Code Status: Full ? ?Pt Overview and Major Events to Date:  ?3/20: admitted ? ?Assessment and Plan: ?[name] is a 81 y.o. female presenting with shortness of breath suspected to be due to acute CHF exacerbation. PMH is significant for HFrEF, T2DM, insomnia, Afib with RVR on eliquis, HLD, HTN, CAD, empyema s/p PEG, pyelonephritis and GERD.  ? ?Acute CHF exacerbation  HFrEF EF 25-30% ?Admission weight of 58.5kg, now 56.6kg. Dry weight suspected to be 56kg. Total UOP of 1.45L after receiving IV lasix '40mg'$ . Remained hemodynamically stable, afebrile and on RA. Home medication of oral lasix '40mg'$  unlikely high enough dose. Suspect that dose response not high enough for home lasix. Will trial higher dosage and consider discharging patient on that if successful. ?-pending echo ?-PT/OT eval and treat ?-Lasix '60mg'$  oral ?-strict I/Os ? ?Hx of Afib w/ RVR  Hx of PE ?Home medications include amiodarone '100mg'$  daily, metoprolol 12.5 mg daily, Eliquis '5mg'$  BID. ?-continue amiodarone and eliquis ?-continue metoprolol tartrate tonight but transition to toprol XL '25mg'$  tomorrow ? ?FEN/GI: Regular ?PPx: Eliquis ?Dispo:Home with home health   pending response to lasix .  ? ?Subjective:  ?Patient states she feels better than yesterday and the fluid in her legs looks much better as well.  Denies any shortness of breath at this time and chest pain. ? ?Objective: ?Temp:  [97.9 ?F (36.6 ?C)-98.2 ?F (36.8 ?C)] 98.1 ?F (36.7 ?C) (03/20 0422) ?Pulse Rate:  [55-79] 55 (03/20 0422) ?Resp:  [10-20] 10 (03/19 2145) ?BP: (122-149)/(68-90) 122/68 (03/20 0422) ?SpO2:  [95 %-99 %] 96 % (03/20 0422) ?Weight:  [56.6 kg-58.5 kg] 56.6 kg (03/19 2314) ?Physical Exam: ?General: Awake,  alert, sitting at bedside ready for PT, NAD ?Cardiovascular: Bradycardic, normal rhythm, no murmurs auscultated ?Respiratory: CTAB, no increased work of breathing ?Abdomen: Soft, nontender, normoactive bowel sounds ?Extremities: No pitting edema appreciated, 2+ pedal pulses ? ?Laboratory: ?Recent Labs  ?Lab 12/29/21 ?1322 12/30/21 ?0119  ?WBC 3.8* 4.2  ?HGB 10.9* 10.9*  ?HCT 35.8* 32.8*  ?PLT 161 153  ? ?Recent Labs  ?Lab 12/29/21 ?1322 12/30/21 ?0119  ?NA 139 141  ?K 4.3 3.3*  ?CL 106 106  ?CO2 23 27  ?BUN 11 9  ?CREATININE 1.10* 1.11*  ?CALCIUM 8.6* 8.7*  ?PROT 6.8  --   ?BILITOT 0.7  --   ?ALKPHOS 66  --   ?ALT 15  --   ?AST 23  --   ?GLUCOSE 190* 146*  ? ? ?CBG (last 3)  ?Recent Labs  ?  12/29/21 ?2322 12/30/21 ?0741 12/30/21 ?1203  ?GLUCAP 162* 107* 177*  ? ? ? ?Imaging/Diagnostic Tests: ?ECHOCARDIOGRAM COMPLETE ? ?Result Date: 12/30/2021 ?   ECHOCARDIOGRAM REPORT   Patient Name:   Dana Coleman Date of Exam: 12/30/2021 Medical Rec #:  [id number]          Height:       62.0 in Accession #:    [id number]         Weight:       124.8 lb Date of Birth:  September 21, 1941           BSA:          1.564 m? Patient Age:    36 years  BP:           122/68 mmHg Patient Gender: F                  HR:           55 bpm. Exam Location:  Inpatient Procedure: 2D Echo Indications:    CHF  History:        Patient has prior history of Echocardiogram examinations, most                 recent 05/06/2021. Previous Myocardial Infarction; Risk                 Factors:Diabetes, Dyslipidemia and Hypertension.  Sonographer:    Arlyss Gandy Referring Phys: [place]  1. EF similar to TTE done 05/21/21 . Left ventricular ejection fraction, by estimation, is 25 to 30%. The left ventricle has severely decreased function. The left ventricle demonstrates global hypokinesis. The left ventricular internal cavity size was moderately dilated. Left ventricular diastolic parameters were normal.  2. Right ventricular systolic  function is moderately reduced. The right ventricular size is mildly enlarged. There is normal pulmonary artery systolic pressure.  3. The mitral valve is abnormal. Mild mitral valve regurgitation. No evidence of mitral stenosis.  4. The aortic valve is tricuspid. There is mild calcification of the aortic valve. Aortic valve regurgitation is trivial. Aortic valve sclerosis is present, with no evidence of aortic valve stenosis.  5. Aortic dilatation noted. There is mild dilatation of the ascending aorta, measuring 38 mm.  6. The inferior vena cava is normal in size with greater than 50% respiratory variability, suggesting right atrial pressure of 3 mmHg. FINDINGS  Left Ventricle: EF similar to TTE done 05/21/21. Left ventricular ejection fraction, by estimation, is 25 to 30%. The left ventricle has severely decreased function. The left ventricle demonstrates global hypokinesis. The left ventricular internal cavity size was moderately dilated. There is no left ventricular hypertrophy. Left ventricular diastolic parameters were normal. Right Ventricle: The right ventricular size is mildly enlarged. No increase in right ventricular wall thickness. Right ventricular systolic function is moderately reduced. There is normal pulmonary artery systolic pressure. The tricuspid regurgitant velocity is 2.68 m/s, and with an assumed right atrial pressure of 3 mmHg, the estimated right ventricular systolic pressure is 78.6 mmHg. Left Atrium: Left atrial size was normal in size. Right Atrium: Right atrial size was normal in size. Pericardium: There is no evidence of pericardial effusion. Mitral Valve: The mitral valve is abnormal. There is mild thickening of the mitral valve leaflet(s). There is mild calcification of the mitral valve leaflet(s). Mild mitral valve regurgitation. No evidence of mitral valve stenosis. Tricuspid Valve: The tricuspid valve is normal in structure. Tricuspid valve regurgitation is mild . No evidence of  tricuspid stenosis. Aortic Valve: The aortic valve is tricuspid. There is mild calcification of the aortic valve. Aortic valve regurgitation is trivial. Aortic valve sclerosis is present, with no evidence of aortic valve stenosis. Aortic valve mean gradient measures 3.0 mmHg. Aortic valve peak gradient measures 6.5 mmHg. Aortic valve area, by VTI measures 2.17 cm?. Pulmonic Valve: The pulmonic valve was normal in structure. Pulmonic valve regurgitation is not visualized. No evidence of pulmonic stenosis. Aorta: Aortic dilatation noted. There is mild dilatation of the ascending aorta, measuring 38 mm. Venous: The inferior vena cava is normal in size with greater than 50% respiratory variability, suggesting right atrial pressure of 3 mmHg. IAS/Shunts: No atrial level shunt detected by color flow  Doppler.  LEFT VENTRICLE PLAX 2D LVIDd:         5.00 cm   Diastology LVIDs:         4.30 cm   LV e' medial:    3.05 cm/s LV PW:         1.10 cm   LV E/e' medial:  26.3 LV IVS:        1.10 cm   LV e' lateral:   5.77 cm/s LVOT diam:     2.00 cm   LV E/e' lateral: 13.9 LV SV:         53 LV SV Index:   34 LVOT Area:     3.14 cm?  RIGHT VENTRICLE            IVC RV Basal diam:  3.30 cm    IVC diam: 1.80 cm RV Mid diam:    2.50 cm RV S prime:     6.85 cm/s TAPSE (M-mode): 1.3 cm LEFT ATRIUM             Index        RIGHT ATRIUM           Index LA diam:        2.80 cm 1.79 cm/m?   RA Area:     17.80 cm? LA Vol (A2C):   87.7 ml 56.06 ml/m?  RA Volume:   45.30 ml  28.96 ml/m? LA Vol (A4C):   54.3 ml 34.71 ml/m? LA Biplane Vol: 72.9 ml 46.60 ml/m?  AORTIC VALVE AV Area (Vmax):    2.14 cm? AV Area (Vmean):   2.30 cm? AV Area (VTI):     2.17 cm? AV Vmax:           127.00 cm/s AV Vmean:          77.000 cm/s AV VTI:            0.246 m AV Peak Grad:      6.5 mmHg AV Mean Grad:      3.0 mmHg LVOT Vmax:         86.70 cm/s LVOT Vmean:        56.400 cm/s LVOT VTI:          0.170 m LVOT/AV VTI ratio: 0.69  AORTA Ao Root diam: 3.70 cm Ao Asc diam:   3.80 cm MITRAL VALVE               TRICUSPID VALVE MV Area (PHT): 2.32 cm?    TR Peak grad:   28.7 mmHg MV Decel Time: 327 msec    TR Vmax:        268.00 cm/s MV E velocity: 80.10 cm/s MV A velocity: 81.80 cm/s

## 2021-12-30 NOTE — Progress Notes (Signed)
FPTS Brief Progress Note ? ?S: Patient seen and assessed at bedside.  Patient voiced no complaints at this time.  Patient did indeed report shortness of breath when ambulating which required oxygen.  Day team redosed Lasix to 80 mg p.o as she did not have much UOP.  Overnight nurse had no concerns at this time and was about to give Lasix 80. ? ? ?O: ?BP 128/68 (BP Location: Right Arm)   Pulse 64   Temp 98.9 ?F (37.2 ?C) (Oral)   Resp 16   Ht '5\' 2"'$  (1.575 m)   Wt 56.6 kg   SpO2 95%   BMI 22.82 kg/m?   ? ? ?A/P: ?- Plans per day team. Likely d/c tomorrow if tolerates Lasix 80 PO redosing.  ?- Orders reviewed. Labs for AM ordered, which was adjusted as needed.  ?- If condition changes, plan includes bedside eval.  ? ?France Ravens, MD ?12/30/2021, 9:47 PM ?PGY-1, [facility] Night Resident  ?Please page [phone] with questions.  ? ?

## 2021-12-30 NOTE — Evaluation (Signed)
Physical Therapy Evaluation ?Patient Details ?Name: [name] ?MRN: [id number] ?DOB: 01-28-1941 ?Today's Date: 12/30/2021 ? ?History of Present Illness ? 81 yo female admitted 3/19 with SOB and CHf exacerbation. PMhx: HFrEF, CAD, PE, GERD, T2DM, HLD, HTN, depression, empyema following cholecystectomy, esophageal perforation s/p repair, ARF, A. fib, PEG, breast CA, insomnia  ?Clinical Impression ? Pt pleasant and lives alone with siblings near by who assist with transportation and bathing. Pt reports limited ability to prepare meals and that she is active with HHPT and RN. Pt with decreased activity tolerance, balance and gait who will benefit from acute therapy to maximize mobility, safety and function to decrease burden of care.  ? ?HR 57-72 ?Sats 95% on RA   ?   ? ?Recommendations for follow up therapy are one component of a multi-disciplinary discharge planning process, led by the attending physician.  Recommendations may be updated based on patient status, additional functional criteria and insurance authorization. ? ?Follow Up Recommendations Home health PT (resume current HHPT) ? ?  ?Assistance Recommended at Discharge Intermittent Supervision/Assistance  ?Patient can return home with the following ? A little help with bathing/dressing/bathroom;Assistance with cooking/housework;Assist for transportation;Direct supervision/assist for financial management;Direct supervision/assist for medications management ? ?  ?Equipment Recommendations None recommended by PT  ?Recommendations for Other Services ?    ?  ?Functional Status Assessment Patient has had a recent decline in their functional status and demonstrates the ability to make significant improvements in function in a reasonable and predictable amount of time.  ? ?  ?Precautions / Restrictions Precautions ?Precautions: Fall  ? ?  ? ?Mobility ? Bed Mobility ?Overal bed mobility: Modified Independent ?  ?  ?  ?  ?  ?  ?General bed mobility comments: with  rail and HOB 20 degrees ?  ? ?Transfers ?Overall transfer level: Modified independent ?  ?  ?  ?  ?  ?  ?  ?  ?General transfer comment: good hand placement to stand from bed and toilet with RW present ?  ? ?Ambulation/Gait ?Ambulation/Gait assistance: Supervision ?Gait Distance (Feet): 140 Feet ?Assistive device: Rolling walker (2 wheels) ?Gait Pattern/deviations: Step-through pattern, Decreased stride length ?  ?Gait velocity interpretation: <1.8 ft/sec, indicate of risk for recurrent falls ?  ?General Gait Details: slow steady gait with slightly slower than normal speed and pt limited by fatigue ? ?Stairs ?  ?  ?  ?  ?  ? ?Wheelchair Mobility ?  ? ?Modified Rankin (Stroke Patients Only) ?  ? ?  ? ?Balance Overall balance assessment: Mild deficits observed, not formally tested ?  ?  ?  ?  ?  ?  ?  ?  ?  ?  ?  ?  ?  ?  ?  ?  ?  ?  ?   ? ? ? ?Pertinent Vitals/Pain Pain Assessment ?Pain Assessment: No/denies pain  ? ? ?Home Living Family/patient expects to be discharged to:: Private residence ?Living Arrangements: Alone ?Available Help at Discharge: Family ?Type of Home: House ?Home Access: Stairs to enter ?Entrance Stairs-Rails: Left ?Entrance Stairs-Number of Steps: 3 ?  ?Home Layout: One level ?Home Equipment: Conservation officer, nature (2 wheels);Cane - single point;Shower seat;BSC/3in1 ?   ?  ?Prior Function Prior Level of Function : Needs assist ?  ?  ?  ?  ?  ?  ?Mobility Comments: using RW ?ADLs Comments: sister assists with bathing and lives across the street, doesn't drive ?  ? ? ?Hand Dominance  ?   ? ?  ?  Extremity/Trunk Assessment  ? Upper Extremity Assessment ?Upper Extremity Assessment: Overall WFL for tasks assessed ?  ? ?Lower Extremity Assessment ?Lower Extremity Assessment: Generalized weakness ?  ? ?Cervical / Trunk Assessment ?Cervical / Trunk Assessment: Normal  ?Communication  ? Communication: No difficulties  ?Cognition Arousal/Alertness: Awake/alert ?Behavior During Therapy: Flat affect ?Overall Cognitive  Status: Impaired/Different from baseline ?Area of Impairment: Problem solving, Orientation ?  ?  ?  ?  ?  ?  ?  ?  ?Orientation Level: Disoriented to, Time ?  ?  ?  ?  ?  ?Problem Solving: Slow processing ?  ?  ?  ? ?  ?General Comments   ? ?  ?Exercises    ? ?Assessment/Plan  ?  ?PT Assessment Patient needs continued PT services  ?PT Problem List Decreased activity tolerance;Decreased knowledge of use of DME;Decreased mobility ? ?   ?  ?PT Treatment Interventions Gait training;Balance training;Stair training;Functional mobility training;Therapeutic activities;Patient/family education;DME instruction;Therapeutic exercise   ? ?PT Goals (Current goals can be found in the Care Plan section)  ?Acute Rehab PT Goals ?Patient Stated Goal: return home ?PT Goal Formulation: With patient ?Time For Goal Achievement: 01/13/22 ?Potential to Achieve Goals: Good ? ?  ?Frequency Min 3X/week ?  ? ? ?Co-evaluation   ?  ?  ?  ?  ? ? ?  ?AM-PAC PT "6 Clicks" Mobility  ?Outcome Measure Help needed turning from your back to your side while in a flat bed without using bedrails?: None ?Help needed moving from lying on your back to sitting on the side of a flat bed without using bedrails?: None ?Help needed moving to and from a bed to a chair (including a wheelchair)?: None ?Help needed standing up from a chair using your arms (e.g., wheelchair or bedside chair)?: A Little ?Help needed to walk in hospital room?: A Little ?Help needed climbing 3-5 steps with a railing? : A Little ?6 Click Score: 21 ? ?  ?End of Session Equipment Utilized During Treatment: Gait belt ?Activity Tolerance: Patient tolerated treatment well ?Patient left: in chair;with call bell/phone within reach;with nursing/sitter in room;with chair alarm set ?Nurse Communication: Mobility status ?PT Visit Diagnosis: Other abnormalities of gait and mobility (R26.89);Difficulty in walking, not elsewhere classified (R26.2) ?  ? ?Time: 8882-8003 ?PT Time Calculation (min) (ACUTE  ONLY): 31 min ? ? ?Charges:   PT Evaluation ?$PT Eval Moderate Complexity: 1 Mod ?PT Treatments ?$Therapeutic Activity: 8-22 mins ?  ?   ? ? ?Bennette Hasty P, PT ?Acute Rehabilitation Services ?Pager: [phone] ?Office: [phone] ? ? ?Fuad Forget B Tassie Pollett ?12/30/2021, 8:58 AM ? ?

## 2021-12-30 NOTE — Evaluation (Signed)
Occupational Therapy Evaluation ?Patient Details ?Name: [name] ?MRN: [id number] ?DOB: April 02, 1941 ?Today's Date: 12/30/2021 ? ? ?History of Present Illness 81 yo female admitted 3/19 with SOB and CHf exacerbation. PMhx: HFrEF, CAD, PE, GERD, T2DM, HLD, HTN, depression, empyema following cholecystectomy, esophageal perforation s/p repair, ARF, A. fib, PEG, breast CA, insomnia  ? ?Clinical Impression ?  ?Pt reports using RW for mobility at baseline, reports sister (who lives across the street) assists with bathing. Pt currently with flat affect during session, slow processing and increased time for task initiation. Pt min guard-min A for ADLs during session, mod I for bed mobility and transfers with RW. VSS on RA. Pt presenting with impairments listed below, will follow acutely. Recommend HHOT at d/c. ?   ? ?Recommendations for follow up therapy are one component of a multi-disciplinary discharge planning process, led by the attending physician.  Recommendations may be updated based on patient status, additional functional criteria and insurance authorization.  ? ?Follow Up Recommendations ? Home health OT  ?  ?Assistance Recommended at Discharge Set up Supervision/Assistance  ?Patient can return home with the following A little help with walking and/or transfers;A little help with bathing/dressing/bathroom;Assist for transportation;Help with stairs or ramp for entrance;Assistance with cooking/housework ? ?  ?Functional Status Assessment ? Patient has had a recent decline in their functional status and demonstrates the ability to make significant improvements in function in a reasonable and predictable amount of time.  ?Equipment Recommendations ? None recommended by OT (pt has all needed DME)  ?  ?Recommendations for Other Services   ? ? ?  ?Precautions / Restrictions Precautions ?Precautions: Fall ?Restrictions ?Weight Bearing Restrictions: No  ? ?  ? ?Mobility Bed Mobility ?Overal bed mobility: Modified  Independent ?  ?  ?  ?  ?  ?  ?General bed mobility comments: able move sit >supine with use of bedrails/HOB elevated. Back in bed per RN request for pt to receive ECG ?  ? ?Transfers ?Overall transfer level: Modified independent ?  ?  ?  ?  ?  ?  ?  ?  ?  ?  ? ?  ?Balance Overall balance assessment: Mild deficits observed, not formally tested ?  ?  ?  ?  ?  ?  ?  ?  ?  ?  ?  ?  ?  ?  ?  ?  ?  ?  ?   ? ?ADL either performed or assessed with clinical judgement  ? ?ADL Overall ADL's : Needs assistance/impaired ?Eating/Feeding: Set up;Sitting ?  ?Grooming: Set up;Sitting ?  ?Upper Body Bathing: Minimal assistance;Sitting ?  ?Lower Body Bathing: Minimal assistance;Sitting/lateral leans ?  ?Upper Body Dressing : Minimal assistance;Sitting ?  ?Lower Body Dressing: Minimal assistance;Sitting/lateral leans;Sit to/from stand ?  ?Toilet Transfer: Min guard;Rolling walker (2 wheels);Ambulation;Regular Toilet ?Toilet Transfer Details (indicate cue type and reason): simulated in room ?Toileting- Clothing Manipulation and Hygiene: Min guard;Sitting/lateral lean;Sit to/from stand ?  ?  ?  ?Functional mobility during ADLs: Min guard;Rolling walker (2 wheels) ?   ? ? ? ?Vision   ?Vision Assessment?: No apparent visual deficits  ?   ?Perception   ?  ?Praxis   ?  ? ?Pertinent Vitals/Pain Pain Assessment ?Pain Assessment: No/denies pain  ? ? ? ?Hand Dominance   ?  ?Extremity/Trunk Assessment Upper Extremity Assessment ?Upper Extremity Assessment: Overall WFL for tasks assessed ?  ?Lower Extremity Assessment ?Lower Extremity Assessment: Defer to PT evaluation ?  ?Cervical / Trunk Assessment ?Cervical /  Trunk Assessment: Normal ?  ?Communication Communication ?Communication: No difficulties ?  ?Cognition Arousal/Alertness: Awake/alert ?Behavior During Therapy: Flat affect ?Overall Cognitive Status: Impaired/Different from baseline ?Area of Impairment: Problem solving ?  ?  ?  ?  ?  ?  ?  ?  ?  ?  ?  ?  ?  ?  ?Problem Solving: Slow  processing ?General Comments: increased time needed for task initiation ?  ?  ?General Comments  VSS on RA ? ?  ?Exercises   ?  ?Shoulder Instructions    ? ? ?Home Living Family/patient expects to be discharged to:: Private residence ?Living Arrangements: Alone ?Available Help at Discharge: Family ?Type of Home: House ?Home Access: Stairs to enter ?Entrance Stairs-Number of Steps: 3 ?Entrance Stairs-Rails: Left ?Home Layout: One level ?  ?  ?Bathroom Shower/Tub: Tub/shower unit ?  ?Bathroom Toilet: Standard ?  ?  ?Home Equipment: Conservation officer, nature (2 wheels);Cane - single point;Shower seat;BSC/3in1 ?  ?  ?  ? ?  ?Prior Functioning/Environment Prior Level of Function : Needs assist ?  ?  ?  ?  ?  ?  ?Mobility Comments: using RW ?ADLs Comments: sister assists with bathing and lives across the street, doesn't drive ?  ? ?  ?  ?OT Problem List: Decreased strength;Decreased range of motion;Decreased activity tolerance;Impaired balance (sitting and/or standing) ?  ?   ?OT Treatment/Interventions: Therapeutic exercise;Self-care/ADL training;Energy conservation;DME and/or AE instruction;Therapeutic activities;Patient/family education;Balance training  ?  ?OT Goals(Current goals can be found in the care plan section) Acute Rehab OT Goals ?Patient Stated Goal: none stated ?OT Goal Formulation: With patient ?Time For Goal Achievement: 01/13/22 ?Potential to Achieve Goals: Good ?ADL Goals ?Pt Will Perform Upper Body Dressing: with supervision;sitting ?Pt Will Perform Lower Body Dressing: with supervision;sitting/lateral leans;sit to/from stand ?Pt Will Transfer to Toilet: with supervision;ambulating;regular height toilet ?Pt Will Perform Tub/Shower Transfer: with supervision;rolling walker;Tub transfer;Shower transfer;shower seat  ?OT Frequency: Min 2X/week ?  ? ?Co-evaluation   ?  ?  ?  ?  ? ?  ?AM-PAC OT "6 Clicks" Daily Activity     ?Outcome Measure Help from another person eating meals?: None ?Help from another person taking  care of personal grooming?: None ?Help from another person toileting, which includes using toliet, bedpan, or urinal?: A Little ?Help from another person bathing (including washing, rinsing, drying)?: A Little ?Help from another person to put on and taking off regular upper body clothing?: A Little ?Help from another person to put on and taking off regular lower body clothing?: A Little ?6 Click Score: 20 ?  ?End of Session Equipment Utilized During Treatment: Rolling walker (2 wheels) ?Nurse Communication: Mobility status ? ?Activity Tolerance: Patient tolerated treatment well ?Patient left: in bed;with call bell/phone within reach;with bed alarm set ? ?OT Visit Diagnosis: Unsteadiness on feet (R26.81);Other abnormalities of gait and mobility (R26.89)  ?              ?Time: 8115-7262 ?OT Time Calculation (min): 16 min ?Charges:  OT General Charges ?$OT Visit: 1 Visit ?OT Evaluation ?$OT Eval Low Complexity: 1 Low ? ?Lynnda Child, OTD, OTR/L ?Acute Rehab ?[phone] ? ?Kaylyn Lim ?12/30/2021, 12:31 PM ?

## 2021-12-30 NOTE — Hospital Course (Addendum)
Dana Coleman is a 81 y.o.female with a history of HFrEF, type 2 diabetes, insomnia, A-fib with RVR, hyperlipidemia, hypertension, CAD, empyema status post PEG, pyelonephritis, and GERD who was admitted to the [facility] Teaching Service at [facility] for acute on chronic . Her hospital course is detailed below: ? ?Acute on chronic CHF exacerbation ?Patient was admitted due to progressive exertional dyspnea.  Patient received one-time dose of IV Lasix 40 mg.  BNP was noted to be >4500 and troponins 29>23.  Chest x-ray showed new small left and trace right pleural effusions.  Patient's shortness of breath gradually improved throughout hospitalization. Echo was performed and showed EF 25-30%, global hypokinesis. Pt was trialed on Lasix '60mg'$  oral but was redosed to lasix 80 mg due to poor urine output.  Due to patient desaturating on ambulation, patient was given additional IV Lasix 40 mg to assess if that would help with ambulatory oxygen saturation. Net output of 2.8L.   ?Admission weight of 58.5kg and discharge weight of 54.2kg, considered to be her new dry weight. Pt was discharged on oral lasix '80mg'$  daily. ? ?Other chronic conditions were medically managed with home medications and formulary alternatives as necessary (CKD stage IIIa, T2DM, A-fib, hypertension, hyperlipidemia, CAD, GERD) ? ?PCP Follow-up Recommendations: ?Follow up left anterior rib pain. No fracture appreciated on CXR ?

## 2021-12-30 NOTE — Progress Notes (Signed)
? ?  This pt is currently enrolled with [facility] which is a home based Palliative Care program provided by [facility]. The pt is getting RN visit 1-3 times a month and SW visit with this program to help mange her chronic illnesses. She can continue to have [place] with our services in place. Please reach out if you have any questions. Webb Silversmith RN [phone]  ?

## 2021-12-30 NOTE — TOC Initial Note (Signed)
Transition of Care (TOC) - Initial/Assessment Note  ? ? ?Patient Details  ?Name: [name] ?MRN: [id number] ?Date of Birth: 1941/07/14 ? ?Transition of Care [facility]) CM/SW Contact:    ?Ninfa Meeker, RN ?Phone Number: ?12/30/2021, 1:21 PM ? ?Clinical Narrative:     Patient is a 81 yr old female admitted with SOB and COPR exacerbation.  Case manager spoke with patient concerning need for Home Health services at discharge. Patient states she is already receiving [facility] with [facility] Case manager confirmed with [facility] Liaison, that patient is active with them. Will ned resumption of care orders for HHRN/PT/OT . Patient has all necessary DME. TOC Team will continue to monitor.  ? ?Patient Goals and CMS Choice ?  ?  ?Choice offered to / list presented to : Patient ? ?Expected Discharge Plan and Services ?Expected Discharge Plan: [place] ?  ?Discharge Planning Services: CM Consult ?Post Acute Care Choice: Home Health, Resumption of Svcs/PTA Provider ?Living arrangements for the past 2 months: [place] ?                ?DME Arranged: N/A ?DME Agency: NA ?  ?  ?  ?HH Arranged: PT, OT, RN ?[place] Agency: [place] ([place]) ?Date HH Agency Contacted: 12/30/21 ?Time [place]: 0034 ?Representative spoke with at [place]: Corene Cornea ? ?Prior Living Arrangements/Services ?Living arrangements for the past 2 months: [place] ?Lives with:: Self ?Patient language and need for interpreter reviewed:: Yes ?Do you feel safe going back to the place where you live?: Yes      ?Need for Family Participation in Patient Care: Yes (Comment) ?Care giver support system in place?: Yes (comment) (sister lives acroos the street) ?Current home services: Home OT, Home PT, Home RN ?Criminal Activity/Legal Involvement Pertinent to Current Situation/Hospitalization: No - Comment as needed ? ?Activities of Daily Living ?Home Assistive Devices/Equipment: CBG Meter,  Shower chair with back, Walker (specify type) ?ADL Screening (condition at time of admission) ?Patient's cognitive ability adequate to safely complete daily activities?: Yes ?Is the patient deaf or have difficulty hearing?: No ?Does the patient have difficulty seeing, even when wearing glasses/contacts?: No ?Does the patient have difficulty concentrating, remembering, or making decisions?: No ?Patient able to express need for assistance with ADLs?: Yes ?Does the patient have difficulty dressing or bathing?: Yes ?Independently performs ADLs?: No ?Communication: Independent ?Dressing (OT): Independent ?Grooming: Independent ?Feeding: Independent ?Bathing: Needs assistance ?Toileting: Independent ?In/Out Bed: Independent ?Walks in Home: Independent with device (comment) ?Does the patient have difficulty walking or climbing stairs?: Yes ?Weakness of Legs: Both ?Weakness of Arms/Hands: None ? ?Permission Sought/Granted ?  ?  ?   ?   ?   ?   ? ?Emotional Assessment ?  ?  ?  ?Orientation: : Oriented to Self, Oriented to Place, Oriented to  Time, Oriented to Situation ?Alcohol / Substance Use: Not Applicable ?Psych Involvement: No (comment) ? ?Admission diagnosis:  Acute exacerbation of CHF (congestive heart failure) ([place]) [I50.9] ?Acute on chronic congestive heart failure, unspecified heart failure type ([place]) [I50.9] ?Patient Active Problem List  ? Diagnosis Date Noted  ? Acute exacerbation of CHF (congestive heart failure) ([place]) 12/29/2021  ? Pyelonephritis 11/06/2021  ? Hematuria 11/06/2021  ? UTI (urinary tract infection) 11/05/2021  ? Dysuria 10/25/2021  ? Gastrostomy tube in place [facility]) 08/21/2021  ? PEG (percutaneous endoscopic gastrostomy) status ([place]) 07/23/2021  ? Failure to thrive in adult 07/22/2021  ? Abdominal pain   ?  Dyspnea   ? On enteral nutrition 05/25/2021  ? Impaired functional mobility, balance, gait, and endurance 05/25/2021  ? S/P thoracotomy   ? NSTEMI (non-ST elevated myocardial infarction) ([place])  05/23/2021  ? Heart failure with reduced ejection fraction ([place]) 05/23/2021  ? Empyema ([place])   ? Acute pulmonary embolism ([place])   ? Acute respiratory failure with hypoxemia (HCC)   ? Acute pulmonary edema (HCC)   ? Acute respiratory failure with hypoxia ([place])   ? Acute renal failure ([place])   ? Hydropneumothorax   ? Protein-calorie malnutrition, severe 05/15/2021  ? Pressure injury of skin 05/05/2021  ? Esophageal perforation 05/05/2021  ? GERD (gastroesophageal reflux disease) 12/19/2020  ? Breast lump 12/07/2018  ? Type 2 diabetes mellitus with other specified complication ([place]) 03/83/3383  ? Debility 06/30/2011  ? History of thyroid nodule 01/16/2011  ? History of breast cancer (right)  stage 1 ERA positive   ? History of cervical cancer   ? Hyperlipidemia   ? Hypertension   ? CAD (coronary artery disease), native coronary artery   ? ?PCP:  Gifford Shave, MD ?Pharmacy:   ?[id number] , [place] - [address] AT [id number]?Phone: [phone]: [phone]? ? ? ? ?Social Determinants of Health (SDOH) Interventions ?  ? ?Readmission Risk Interventions ?No flowsheet data found. ? ? ?

## 2021-12-30 NOTE — Progress Notes (Signed)
Echocardiogram ?2D Echocardiogram has been performed. ? ?Dana Coleman ?12/30/2021, 10:07 AM ?

## 2021-12-30 NOTE — Progress Notes (Signed)
SATURATION QUALIFICATIONS: (This note is used to comply with regulatory documentation for home oxygen) ? ?Patient Saturations on Room Air at Rest = 97% ? ?Patient Saturations on Room Air while Ambulating = 80% ? ?Patient Saturations on 2 Liters of oxygen while Ambulating = 92% ? ?Please briefly explain why patient needs home oxygen: Pt SpO2 dropped to low 80s while ambulating. Pt felt tired and SOB. ?

## 2021-12-31 DIAGNOSIS — I509 Heart failure, unspecified: Secondary | ICD-10-CM | POA: Diagnosis not present

## 2021-12-31 DIAGNOSIS — I130 Hypertensive heart and chronic kidney disease with heart failure and stage 1 through stage 4 chronic kidney disease, or unspecified chronic kidney disease: Secondary | ICD-10-CM | POA: Diagnosis not present

## 2021-12-31 LAB — BASIC METABOLIC PANEL
Anion gap: 9 (ref 5–15)
BUN: 11 mg/dL (ref 8–23)
CO2: 26 mmol/L (ref 22–32)
Calcium: 8.7 mg/dL — ABNORMAL LOW (ref 8.9–10.3)
Chloride: 106 mmol/L (ref 98–111)
Creatinine, Ser: 1.15 mg/dL — ABNORMAL HIGH (ref 0.44–1.00)
GFR, Estimated: 48 mL/min — ABNORMAL LOW (ref >60)
Glucose, Bld: 124 mg/dL — ABNORMAL HIGH (ref 70–99)
Potassium: 4 mmol/L (ref 3.5–5.1)
Sodium: 141 mmol/L (ref 135–145)

## 2021-12-31 LAB — GLUCOSE, CAPILLARY
Glucose-Capillary: 142 mg/dL — ABNORMAL HIGH (ref 70–99)
Glucose-Capillary: 260 mg/dL — ABNORMAL HIGH (ref 70–99)
Glucose-Capillary: 117 mg/dL — ABNORMAL HIGH (ref 70–99)
Glucose-Capillary: 213 mg/dL — ABNORMAL HIGH (ref 70–99)

## 2021-12-31 MED ORDER — FUROSEMIDE 10 MG/ML IJ SOLN
40.0000 mg | Freq: Once | INTRAMUSCULAR | Status: AC
  Administered 2021-12-31: 40 mg via INTRAVENOUS
  Filled 2021-12-31: qty 4

## 2021-12-31 MED ORDER — ACETAMINOPHEN 325 MG PO TABS
650.0000 mg | ORAL_TABLET | Freq: Four times a day (QID) | ORAL | Status: DC | PRN
  Administered 2021-12-31: 650 mg via ORAL
  Filled 2021-12-31: qty 2

## 2021-12-31 NOTE — Progress Notes (Signed)
Physical Therapy Treatment ?Patient Details ?Name: [name] ?MRN: [id number] ?DOB: 13-Sep-1941 ?Today's Date: 12/31/2021 ? ? ?History of Present Illness 81 yo female admitted 3/19 with SOB and CHf exacerbation. PMhx: HFrEF, CAD, PE, GERD, T2DM, HLD, HTN, depression, empyema following cholecystectomy, esophageal perforation s/p repair, ARF, A. fib, PEG, breast CA, insomnia ? ?  ?PT Comments  ? ? Pt making good progress with mobility. SpO2 96-100% on RA at rest and with amb during session.    ?Recommendations for follow up therapy are one component of a multi-disciplinary discharge planning process, led by the attending physician.  Recommendations may be updated based on patient status, additional functional criteria and insurance authorization. ? ?Follow Up Recommendations ? Home health PT (resume) ?  ?  ?Assistance Recommended at Discharge Intermittent Supervision/Assistance  ?Patient can return home with the following A little help with bathing/dressing/bathroom;Assistance with cooking/housework;Assist for transportation;Direct supervision/assist for financial management;Direct supervision/assist for medications management ?  ?Equipment Recommendations ? None recommended by PT  ?  ?Recommendations for Other Services   ? ? ?  ?Precautions / Restrictions Precautions ?Precautions: Fall  ?  ? ?Mobility ? Bed Mobility ?Overal bed mobility: Independent ?  ?  ?  ?  ?  ?  ?  ?  ? ?Transfers ?Overall transfer level: Modified independent ?Equipment used: Rolling walker (2 wheels) ?Transfers: Sit to/from Stand ?Sit to Stand: Modified independent (Device/Increase time) ?  ?  ?  ?  ?  ?  ?  ? ?Ambulation/Gait ?Ambulation/Gait assistance: Supervision ?Gait Distance (Feet): 200 Feet ?Assistive device: Rolling walker (2 wheels) ?Gait Pattern/deviations: Step-through pattern, Decreased stride length ?Gait velocity: decr ?Gait velocity interpretation: 1.31 - 2.62 ft/sec, indicative of limited community ambulator ?  ?General  Gait Details: Supervision for safety/lines. Pt with one standing rest break. ? ? ?Stairs ?  ?  ?  ?  ?  ? ? ?Wheelchair Mobility ?  ? ?Modified Rankin (Stroke Patients Only) ?  ? ? ?  ?Balance Overall balance assessment: Mild deficits observed, not formally tested ?  ?  ?  ?  ?  ?  ?  ?  ?  ?  ?  ?  ?  ?  ?  ?  ?  ?  ?  ? ?  ?Cognition Arousal/Alertness: Awake/alert ?Behavior During Therapy: [facility] for tasks assessed/performed ?Overall Cognitive Status: Within Functional Limits for tasks assessed ?  ?  ?  ?  ?  ?  ?  ?  ?  ?  ?  ?  ?  ?  ?  ?  ?  ?  ?  ? ?  ?Exercises   ? ?  ?General Comments General comments (skin integrity, edema, etc.): VSS on RA. SpO2 96-100% on RA at rest and with activity. ?  ?  ? ?Pertinent Vitals/Pain Pain Assessment ?Pain Assessment: No/denies pain  ? ? ?Home Living   ?  ?  ?  ?  ?  ?  ?  ?  ?  ?   ?  ?Prior Function    ?  ?  ?   ? ?PT Goals (current goals can now be found in the care plan section) Progress towards PT goals: Progressing toward goals ? ?  ?Frequency ? ? ? Min 3X/week ? ? ? ?  ?PT Plan Current plan remains appropriate  ? ? ?Co-evaluation   ?  ?  ?  ?  ? ?  ?AM-PAC PT "6 Clicks" Mobility   ?Outcome Measure ? Help  needed turning from your back to your side while in a flat bed without using bedrails?: None ?Help needed moving from lying on your back to sitting on the side of a flat bed without using bedrails?: None ?Help needed moving to and from a bed to a chair (including a wheelchair)?: None ?Help needed standing up from a chair using your arms (e.g., wheelchair or bedside chair)?: A Little ?Help needed to walk in hospital room?: A Little ?Help needed climbing 3-5 steps with a railing? : A Little ?6 Click Score: 21 ? ?  ?End of Session   ?Activity Tolerance: Patient tolerated treatment well ?Patient left: in bed;with call bell/phone within reach;with bed alarm set ?  ?PT Visit Diagnosis: Other abnormalities of gait and mobility (R26.89);Difficulty in walking, not elsewhere  classified (R26.2) ?  ? ? ?Time: 8144-8185 ?PT Time Calculation (min) (ACUTE ONLY): 15 min ? ?Charges:  $Gait Training: 8-22 mins          ?          ? ?[facility] PT ?Acute Rehabilitation Services ?Pager [phone] ?Office [phone] ? ? ? ?[facility] ?12/31/2021, 4:46 PM ? ?

## 2021-12-31 NOTE — Progress Notes (Signed)
3Family Medicine Teaching Service ?Daily Progress Note ?Intern Pager: [phone] ? ?Patient name: Dana Coleman Medical record number: [id number] ?Date of birth: Apr 14, 1941 Age: 81 y.o. Gender: female ? ?Primary Care Provider: Gifford Shave, MD ?Consultants: None ?Code Status: Full ? ?Pt Overview and Major Events to Date:  ?/20: Admitted ? ?Assessment and Plan: ?Dana Coleman is a 81 y.o. female presenting with shortness of breath suspected to be due to acute CHF exacerbation. PMH is significant for HFrEF, T2DM, insomnia, Afib with RVR on eliquis, HLD, HTN, CAD, empyema s/p PEG, pyelonephritis and GERD.  ? ?Acute CHF exacerbation  HFrEF ?Admission weight of 58.5 kg, no recent weight.  Dry weight suspected be 56 kg.  Net output 2 L.  Patient still has 1 L oxygen requirement with ambulation.  Will attempt to further diurese prior to sending home. ?-PT/OT eval and treat ?-IV Lasix 40 mg ?-Strict I's and O's ?-Daily weights ? ?History of A-fib with RVR: Stable, rate controlled ?-Continue amiodarone and Eliquis ?-Continue Toprol-XL 25 mg daily ? ?Rib pain: Chronic ?Chronic rib pain due to prior hospitalization. Chest x-ray without notable acute findings. May be followed outpatient.  ? ?FEN/GI: Regular ?PPx: Eliquis ?Dispo:Home with home health  tomorrow. Barriers include diuresis.  ? ?Subjective:  ?Patient complaining of chronic rib pain that she has noticed for at least the last month.  Denies any shortness of breath and states she feels well. ? ?Objective: ?Temp:  [97.9 ?F (36.6 ?C)-98.9 ?F (37.2 ?C)] 98.1 ?F (36.7 ?C) (03/21 1143) ?Pulse Rate:  [55-66] 61 (03/21 1143) ?Resp:  [16] 16 (03/21 1143) ?BP: (111-128)/(68-85) 116/76 (03/21 1143) ?SpO2:  [93 %-97 %] 97 % (03/21 1143) ?Physical Exam: ?General: Awake, alert, NAD ?Cardiovascular: RRR, no murmurs auscultated ?Respiratory: CTA B, no increased work of breathing ?Abdomen: Soft, nontender, normoactive bowel sounds, less than 1 cm immovable bony nodule under  left breast on anterior portion of rib ?Extremities: No pitting edema appreciated ? ?Chaperone present during exam. ? ?Laboratory: ?Recent Labs  ?Lab 12/29/21 ?1322 12/30/21 ?0119  ?WBC 3.8* 4.2  ?HGB 10.9* 10.9*  ?HCT 35.8* 32.8*  ?PLT 161 153  ? ?Recent Labs  ?Lab 12/29/21 ?1322 12/30/21 ?0119 12/31/21 ?0308  ?NA 139 141 141  ?K 4.3 3.3* 4.0  ?CL 106 106 106  ?CO2 '23 27 26  '$ ?BUN '11 9 11  '$ ?CREATININE 1.10* 1.11* 1.15*  ?CALCIUM 8.6* 8.7* 8.7*  ?PROT 6.8  --   --   ?BILITOT 0.7  --   --   ?ALKPHOS 66  --   --   ?ALT 15  --   --   ?AST 23  --   --   ?GLUCOSE 190* 146* 124*  ? ?CBG (last 3)  ?Recent Labs  ?  12/31/21 ?0747 12/31/21 ?1141 12/31/21 ?1535  ?GLUCAP 142* 260* 117*  ? ?Imaging/Diagnostic Tests: ?No results found. ? ? ?Wells Guiles, DO ?12/31/2021, 3:33 PM ?PGY-1, [place] ?[place] Intern pager: [phone], text pages welcome ? ?

## 2021-12-31 NOTE — Progress Notes (Signed)
SATURATION QUALIFICATIONS: (This note is used to comply with regulatory documentation for home oxygen) ? ?Patient Saturations on Room Air at Rest =95% ? ?Patient Saturations on Room Air while Ambulating = 85% ? ?Patient Saturations on 1 Liters of oxygen while Ambulating = 92% ? ?Please briefly explain why patient needs home oxygen: Pt SpO2 dropped to 85% while ambulating at RA. Pt will require 1 L O2 to keep it >90% ?

## 2021-12-31 NOTE — Progress Notes (Signed)
FPTS Brief Progress Note ? ?S: Went to evaluate patient for night rounds.  Patient reports she is doing well and although she did get short of breath with ambulation today she is ready to go home tomorrow hopefully.  She does report left-sided muscular rib pain and would like some Tylenol for that. ? ? ?O: ?BP 117/75 (BP Location: Right Arm)   Pulse (!) 58   Temp 98.3 ?F (36.8 ?C) (Oral)   Resp 18   Ht '5\' 2"'$  (1.575 m)   Wt 56.6 kg   SpO2 97%   BMI 22.82 kg/m?   ?General: Pleasant 81 year old female ?Cardiac: Regular rate ?Respiratory: Normal breathing, speaking in full sentences on room air ? ?A/P: ?Tylenol has been ordered for rib pain.  No other changes to day team plans. ?- Orders reviewed. Labs for AM ordered, which was adjusted as needed.  ? ? ?Gifford Shave, MD ?12/31/2021, 9:35 PM ?PGY-3, [place] Night Resident  ?Please page [phone] with questions.  ? ? ?

## 2022-01-01 ENCOUNTER — Other Ambulatory Visit (HOSPITAL_COMMUNITY): Payer: Self-pay

## 2022-01-01 DIAGNOSIS — I509 Heart failure, unspecified: Secondary | ICD-10-CM | POA: Diagnosis not present

## 2022-01-01 DIAGNOSIS — I130 Hypertensive heart and chronic kidney disease with heart failure and stage 1 through stage 4 chronic kidney disease, or unspecified chronic kidney disease: Secondary | ICD-10-CM | POA: Diagnosis not present

## 2022-01-01 LAB — GLUCOSE, CAPILLARY
Glucose-Capillary: 152 mg/dL — ABNORMAL HIGH (ref 70–99)
Glucose-Capillary: 172 mg/dL — ABNORMAL HIGH (ref 70–99)

## 2022-01-01 MED ORDER — METOPROLOL SUCCINATE ER 25 MG PO TB24
25.0000 mg | ORAL_TABLET | Freq: Every day | ORAL | 0 refills | Status: DC
  Filled 2022-01-01: qty 30, 30d supply, fill #0

## 2022-01-01 MED ORDER — FUROSEMIDE 80 MG PO TABS
80.0000 mg | ORAL_TABLET | Freq: Every day | ORAL | 0 refills | Status: DC
  Filled 2022-01-01: qty 30, 30d supply, fill #0
  Filled 2022-01-01: qty 60, 30d supply, fill #0

## 2022-01-01 MED ORDER — FUROSEMIDE 40 MG PO TABS
60.0000 mg | ORAL_TABLET | Freq: Once | ORAL | Status: AC
  Administered 2022-01-01: 60 mg via ORAL
  Filled 2022-01-01: qty 1

## 2022-01-01 NOTE — Progress Notes (Signed)
Mobility Specialist Progress Note  ? ? 01/01/22 1138  ?Mobility  ?Activity Ambulated with assistance in hallway  ?Level of Assistance Standby assist, set-up cues, supervision of patient - no hands on  ?Assistive Device Front wheel walker  ?Distance Ambulated (ft) 220 ft  ?Activity Response Tolerated well  ?$Mobility charge 1 Mobility  ? ?Pre-Mobility: 98% SpO2 ?Post-Mobility: 98% SpO2 ? ?Pt received in bed and agreeable. No complaints. Ambulated on RA. Had void and BM in BR. Left in bed with call bell in reach.  ? ?Dana Coleman ?Mobility Specialist  ?  ?

## 2022-01-01 NOTE — Discharge Summary (Signed)
Family Medicine Teaching Service ?Hospital Discharge Summary ? ?Patient name: Dana Coleman Medical record number: [id number] ?Date of birth: 1941-01-16 Age: 81 y.o. Gender: female ?Date of Admission: 12/29/2021  Date of Discharge: 01/01/2022 ?Admitting Physician: France Ravens, MD ? ?Primary Care Provider: Gifford Shave, MD ?Consultants: None ? ?Indication for Hospitalization: acute CHF exacerbation ? ?Discharge Diagnoses/Problem List:  ?Principal Problem: ?  Acute exacerbation of CHF (congestive heart failure) ([place]) ?  ? ?Disposition: HHPT ? ?Discharge Condition: Stable ? ?Discharge Exam:  ?Blood pressure 120/61, pulse 61, temperature 98.5 ?F (36.9 ?C), temperature source Oral, resp. rate 16, height '5\' 2"'$  (1.575 m), weight 54.2 kg, SpO2 93 %.  ?General: awake, alert, NAD ?CV: RRR, no murmurs auscultated ?Pulm: CTAB, no increased WOB, no crackles or wheezing ?Extremities: no pitting edema appreciated ? ?Brief Hospital Course:  ?[name] is a 81 y.o.female with a history of HFrEF, type 2 diabetes, insomnia, A-fib with RVR, hyperlipidemia, hypertension, CAD, empyema status post PEG, pyelonephritis, and GERD who was admitted to the [facility] Teaching Service at [facility] for acute on chronic . Her hospital course is detailed below: ? ?Acute on chronic CHF exacerbation ?Patient was admitted due to progressive exertional dyspnea.  Patient received one-time dose of IV Lasix 40 mg.  BNP was noted to be >4500 and troponins 29>23.  Chest x-ray showed new small left and trace right pleural effusions.  Patient's shortness of breath gradually improved throughout hospitalization. Echo was performed and showed EF 25-30%, global hypokinesis. Pt was trialed on Lasix '60mg'$  oral but was redosed to lasix 80 mg due to poor urine output.  Due to patient desaturating on ambulation, patient was given additional IV Lasix 40 mg to assess if that would help with ambulatory oxygen saturation. Net output of 2.8L.   ?Admission weight  of 58.5kg and discharge weight of 54.2kg, considered to be her new dry weight. Pt was discharged on oral lasix '80mg'$  daily. ? ?Other chronic conditions were medically managed with home medications and formulary alternatives as necessary (CKD stage IIIa, T2DM, A-fib, hypertension, hyperlipidemia, CAD, GERD) ? ?PCP Follow-up Recommendations: ?Follow up left anterior rib pain. No fracture appreciated on CXR ? ? ?Significant Procedures: None ? ?Significant Labs and Imaging:  ?Recent Labs  ?Lab 12/29/21 ?1322 12/30/21 ?0119  ?WBC 3.8* 4.2  ?HGB 10.9* 10.9*  ?HCT 35.8* 32.8*  ?PLT 161 153  ? ?Recent Labs  ?Lab 12/29/21 ?1322 12/30/21 ?0119 12/31/21 ?0308  ?NA 139 141 141  ?K 4.3 3.3* 4.0  ?CL 106 106 106  ?CO2 '23 27 26  '$ ?GLUCOSE 190* 146* 124*  ?BUN '11 9 11  '$ ?CREATININE 1.10* 1.11* 1.15*  ?CALCIUM 8.6* 8.7* 8.7*  ?ALKPHOS 66  --   --   ?AST 23  --   --   ?ALT 15  --   --   ?ALBUMIN 3.3*  --   --   ? ?CBG (last 3)  ?Recent Labs  ?  12/31/21 ?2118 01/01/22 ?0728 01/01/22 ?1116  ?GLUCAP 213* 152* 172*  ? ? ?Results/Tests Pending at Time of Discharge: None ? ?Discharge Medications:  ?Allergies as of 01/01/2022   ?No Known Allergies ?  ? ?  ?Medication List  ?  ? ?STOP taking these medications   ? ?metoprolol tartrate 25 MG tablet ?Commonly known as: LOPRESSOR ?  ?spironolactone 25 MG tablet ?Commonly known as: ALDACTONE ?  ? ?  ? ?TAKE these medications   ? ?acetaminophen 500 MG tablet ?Commonly known as: TYLENOL ?Take 500 mg by  mouth every 6 (six) hours as needed for moderate pain. ?  ?amiodarone 200 MG tablet ?Commonly known as: PACERONE ?Take 100 mg by mouth daily. ?What changed: Another medication with the same name was removed. Continue taking this medication, and follow the directions you see here. ?  ?aspirin EC 81 MG tablet ?Take 81 mg by mouth in the morning. Swallow whole. ?  ?Blood Glucose Monitoring Suppl Misc ?daily. ?  ?Eliquis 5 MG Tabs tablet ?Generic drug: apixaban ?TAKE 1 TABLET BY MOUTH TWICE A DAY ?   ?Entresto 49-51 MG ?Generic drug: sacubitril-valsartan ?Take 1 tablet by mouth 2 (two) times daily. ?What changed: Another medication with the same name was removed. Continue taking this medication, and follow the directions you see here. ?  ?furosemide 40 MG tablet ?Commonly known as: LASIX ?Take 2 tablets (80 mg total) by mouth daily. ?What changed: how much to take ?  ?glucose blood test strip ?Commonly known as: ONE TOUCH ULTRA TEST ?CHECK BLOOD SUGAR TWICE DAILY ?What changed:  ?when to take this ?additional instructions ?  ?Insulin Pen Needle 31G X 5 MM Misc ?Commonly known as: B-D UF III MINI PEN NEEDLES ?INJECT INSULIN VIA PEN 6 TIMES DAILY ?  ?Lantus SoloStar 100 UNIT/ML Solostar Pen ?Generic drug: insulin glargine ?Inject 6 Units into the skin in the morning. ?  ?Melatonin 3 MG Tbdp ?Take 6 mg by mouth at bedtime as needed (sleep). ?  ?metoprolol succinate 25 MG 24 hr tablet ?Commonly known as: TOPROL-XL ?Take 1 tablet (25 mg total) by mouth daily. ?Start taking on: January 02, 2022 ?  ?omeprazole 20 MG capsule ?Commonly known as: PRILOSEC ?Take 40 mg by mouth every morning. ?  ?ondansetron 4 MG tablet ?Commonly known as: Zofran ?Take 1 tablet (4 mg total) by mouth daily as needed for nausea or vomiting. ?  ?OneTouch Delica Lancets 95K Misc ?1 Container by Does not apply route as needed. ?What changed: when to take this ?  ?rosuvastatin 20 MG tablet ?Commonly known as: CRESTOR ?TAKE 1 TABLET BY MOUTH EVERYDAY AT BEDTIME ?  ?traZODone 50 MG tablet ?Commonly known as: DESYREL ?Take 50 mg by mouth at bedtime as needed for sleep. ?  ? ?  ? ?  ?  ? ? ?  ?Durable Medical Equipment  ?(From admission, onward)  ?  ? ? ?  ? ?  Start     Ordered  ? 12/31/21 1506  For home use only DME oxygen  Once       ?Question Answer Comment  ?Length of Need Lifetime   ?Mode or (Route) Nasal cannula   ?Liters per Minute 1   ?Oxygen delivery system Gas   ?  ? 12/31/21 1506  ? ?  ?  ? ?  ? ? ?Discharge Instructions: Please refer to  Patient Instructions section of EMR for full details.  Patient was counseled important signs and symptoms that should prompt return to medical care, changes in medications, dietary instructions, activity restrictions, and follow up appointments.  ? ?Follow-Up Appointments: ?No future appointments. ? ?Wells Guiles, DO ?01/01/2022, 2:26 PM ?PGY-1, [place] ? ?

## 2022-01-01 NOTE — Progress Notes (Signed)
SATURATION QUALIFICATIONS: (This note is used to comply with regulatory documentation for home oxygen) ? ?Patient Saturations on Room Air at Rest = 97% ? ?Patient Saturations on Room Air while Ambulating = 98% ? ? ?

## 2022-01-02 ENCOUNTER — Telehealth: Payer: Self-pay

## 2022-01-02 DIAGNOSIS — N12 Tubulo-interstitial nephritis, not specified as acute or chronic: Secondary | ICD-10-CM | POA: Diagnosis not present

## 2022-01-02 DIAGNOSIS — I5020 Unspecified systolic (congestive) heart failure: Secondary | ICD-10-CM | POA: Diagnosis not present

## 2022-01-02 DIAGNOSIS — Z7901 Long term (current) use of anticoagulants: Secondary | ICD-10-CM | POA: Diagnosis not present

## 2022-01-02 DIAGNOSIS — I2510 Atherosclerotic heart disease of native coronary artery without angina pectoris: Secondary | ICD-10-CM | POA: Diagnosis not present

## 2022-01-02 DIAGNOSIS — I951 Orthostatic hypotension: Secondary | ICD-10-CM | POA: Diagnosis not present

## 2022-01-02 DIAGNOSIS — I110 Hypertensive heart disease with heart failure: Secondary | ICD-10-CM | POA: Diagnosis not present

## 2022-01-02 DIAGNOSIS — Z86711 Personal history of pulmonary embolism: Secondary | ICD-10-CM | POA: Diagnosis not present

## 2022-01-02 DIAGNOSIS — Z794 Long term (current) use of insulin: Secondary | ICD-10-CM | POA: Diagnosis not present

## 2022-01-02 DIAGNOSIS — G4700 Insomnia, unspecified: Secondary | ICD-10-CM | POA: Diagnosis not present

## 2022-01-02 DIAGNOSIS — E785 Hyperlipidemia, unspecified: Secondary | ICD-10-CM | POA: Diagnosis not present

## 2022-01-02 DIAGNOSIS — E1136 Type 2 diabetes mellitus with diabetic cataract: Secondary | ICD-10-CM | POA: Diagnosis not present

## 2022-01-02 DIAGNOSIS — N179 Acute kidney failure, unspecified: Secondary | ICD-10-CM | POA: Diagnosis not present

## 2022-01-02 DIAGNOSIS — E43 Unspecified severe protein-calorie malnutrition: Secondary | ICD-10-CM | POA: Diagnosis not present

## 2022-01-02 DIAGNOSIS — Z7984 Long term (current) use of oral hypoglycemic drugs: Secondary | ICD-10-CM | POA: Diagnosis not present

## 2022-01-02 DIAGNOSIS — K219 Gastro-esophageal reflux disease without esophagitis: Secondary | ICD-10-CM | POA: Diagnosis not present

## 2022-01-02 DIAGNOSIS — I4891 Unspecified atrial fibrillation: Secondary | ICD-10-CM | POA: Diagnosis not present

## 2022-01-02 DIAGNOSIS — M47892 Other spondylosis, cervical region: Secondary | ICD-10-CM | POA: Diagnosis not present

## 2022-01-02 DIAGNOSIS — N281 Cyst of kidney, acquired: Secondary | ICD-10-CM | POA: Diagnosis not present

## 2022-01-02 DIAGNOSIS — M8580 Other specified disorders of bone density and structure, unspecified site: Secondary | ICD-10-CM | POA: Diagnosis not present

## 2022-01-02 DIAGNOSIS — R319 Hematuria, unspecified: Secondary | ICD-10-CM | POA: Diagnosis not present

## 2022-01-02 DIAGNOSIS — Z7982 Long term (current) use of aspirin: Secondary | ICD-10-CM | POA: Diagnosis not present

## 2022-01-02 DIAGNOSIS — D649 Anemia, unspecified: Secondary | ICD-10-CM | POA: Diagnosis not present

## 2022-01-02 DIAGNOSIS — M722 Plantar fascial fibromatosis: Secondary | ICD-10-CM | POA: Diagnosis not present

## 2022-01-02 DIAGNOSIS — Z9181 History of falling: Secondary | ICD-10-CM | POA: Diagnosis not present

## 2022-01-02 NOTE — Telephone Encounter (Signed)
Spoke with patient regarding the referral and she is interested in seeing the heart failure team.  Ambulatory referral placed for our heart failure clinic. ?

## 2022-01-02 NOTE — Telephone Encounter (Signed)
Dana Coleman [facility] RN calls nurse line requesting a referral to [facility].  ? ?Dana Coleman reports she called the clinic and the patient would qualify.  ? ?Dana Coleman reports she feels she will get better care there.  ? ?Will forward to PCP.  ?

## 2022-01-06 ENCOUNTER — Other Ambulatory Visit: Payer: Self-pay

## 2022-01-06 ENCOUNTER — Encounter: Payer: Self-pay | Admitting: Family Medicine

## 2022-01-06 ENCOUNTER — Ambulatory Visit (INDEPENDENT_AMBULATORY_CARE_PROVIDER_SITE_OTHER): Payer: Medicare Other | Admitting: Family Medicine

## 2022-01-06 VITALS — BP 150/69 | HR 60 | Ht 62.0 in | Wt 119.4 lb

## 2022-01-06 DIAGNOSIS — I509 Heart failure, unspecified: Secondary | ICD-10-CM

## 2022-01-06 DIAGNOSIS — R0781 Pleurodynia: Secondary | ICD-10-CM

## 2022-01-06 NOTE — Assessment & Plan Note (Addendum)
The patient's weight and symptoms are reassuring following her recent hospitalization. It is likely that both her diet and grief over losses in the family have contributed to the exacerbation of her CHF. Discussed utilizing support structures, dietary modification, and weighing herself daily to monitor for fluid retention and avoid hospitalization. Counseled patient to make an appointment urgently if fluid retention is noted. Follow-up in 3 months to assess CHF status or sooner if needed. ?

## 2022-01-06 NOTE — Patient Instructions (Addendum)
I think you are doing well. ?I would like to recheck you in 3-4  weeks to be certain you are staying well. ?I hope you continue to get great support from you family.  You need family in times like this.   ?Please weigh yourself every day.  Gaining weight can be a sign of holding on to fluid.  I think you should weigh less than 120 lbs.   ?I hope you have the chance to take good care of yourself over the next several months.  You have certainly done more than your fair share of caring for others.   ?

## 2022-01-06 NOTE — Progress Notes (Signed)
? ? ?SUBJECTIVE:  ? ?CHIEF COMPLAINT / HPI:  ?Dana Coleman is a 81 y.o. female with a PMH of HFrEF presenting to the clinic for follow-up after hospitalization with CHF exacerbation. ? ?CHF Exacerbation Follow-up ?The patient was hospitalized on the 19th for acute CHF exacerbation. Since discharge, she has been maintaining her dry weight steadily, taking her medications, and feeling better. She denies extremity swelling, PNH, or orthopnea. She endorses exertional dyspnea if highly active around the house. ? ?She believes her diet may have contributed to her CHF exacerbation and will work on moderating her intake of salty foods. The patient recently lost 2 daughters and her sister in the last 6 months. The patient's other sister was present at this visit and expresses concern that the loss is weighing on the patient and has contributed to her medical condition. ? ?Rib pain on left side ?The patient notes persistent 2/10 rib soreness on the front left side that has been going on for months with no change and no instigating injury she can think of. The pain is not worse with movement, but the area is tender if touched. Taking Tylenol has helped make it feel better. ? ?PERTINENT  PMH / PSH: ?-HFrEF ?-GERD ?-T2DM ?-CAD ?-Hx of NSTEMI on 05/23/21 ? ?Outpatient Encounter Medications as of 01/06/2022  ?Medication Sig  ? acetaminophen (TYLENOL) 500 MG tablet Take 500 mg by mouth every 6 (six) hours as needed for moderate pain.  ? amiodarone (PACERONE) 200 MG tablet Take 100 mg by mouth daily.  ? aspirin EC 81 MG tablet Take 81 mg by mouth in the morning. Swallow whole.  ? Blood Glucose Monitoring Suppl MISC daily.  ? ELIQUIS 5 MG TABS tablet TAKE 1 TABLET BY MOUTH TWICE A DAY  ? ENTRESTO 49-51 MG Take 1 tablet by mouth 2 (two) times daily.  ? furosemide (LASIX) 80 MG tablet Take 1 tablet (80 mg total) by mouth daily.  ? glucose blood (ONE TOUCH ULTRA TEST) test strip CHECK BLOOD SUGAR TWICE DAILY (Patient taking  differently: daily.)  ? insulin glargine (LANTUS SOLOSTAR) 100 UNIT/ML Solostar Pen Inject 6 Units into the skin in the morning.  ? Insulin Pen Needle (B-D UF III MINI PEN NEEDLES) 31G X 5 MM MISC INJECT INSULIN VIA PEN 6 TIMES DAILY  ? Melatonin 3 MG TBDP Take 6 mg by mouth at bedtime as needed (sleep).  ? metoprolol succinate (TOPROL-XL) 25 MG 24 hr tablet Take 1 tablet (25 mg total) by mouth daily.  ? omeprazole (PRILOSEC) 20 MG capsule Take 40 mg by mouth every morning.  ? ondansetron (ZOFRAN) 4 MG tablet Take 1 tablet (4 mg total) by mouth daily as needed for nausea or vomiting.  ? OneTouch Delica Lancets 71I MISC 1 Container by Does not apply route as needed. (Patient taking differently: 1 Container by Does not apply route daily.)  ? rosuvastatin (CRESTOR) 20 MG tablet TAKE 1 TABLET BY MOUTH EVERYDAY AT BEDTIME  ? traZODone (DESYREL) 50 MG tablet Take 50 mg by mouth at bedtime as needed for sleep.  ? ?No facility-administered encounter medications on file as of 01/06/2022.  ? New meds post hospitalization are bolded. ? ?OBJECTIVE:  ? ?BP (!) 150/69   Pulse 60   Ht '5\' 2"'$  (1.575 m)   Wt 119 lb 6.4 oz (54.2 kg)   SpO2 100%   BMI 21.84 kg/m?   ?General: age-appropriate, resting comfortably in chair, NAD, WNWD, alert and at baseline ?HEENT: NCAT. PERRLA. Sclera without injection  or icterus. MMM. ?Cardiovascular: Regular rate and rhythm. Normal S1/S2. No murmurs, rubs, or gallops appreciated. 2+ radial pulses. ?Pulmonary: Clear bilaterally to ascultation. No increased WOB, no accessory muscle usage. No wheezes, rales, or crackles. ?MSK: Mild TTP over left anterior 9-10 ribs. Normal ROM, no pain with movement. ?Extremities: no edema ?Psych: Pleasant and appropriate ? ?ASSESSMENT/PLAN:  ? ?Dana Coleman is a 81 y.o. female with a PMH of HFrEF, CAD, and NSTEMI presenting to the clinic for follow-up after hospitalization d/t acute CHF exacerbation with no SOB, edema, or weight gain. ? ?Acute exacerbation of CHF  (congestive heart failure) ([place]) ?The patient's weight and symptoms are reassuring following her recent hospitalization. It is likely that both her diet and grief over losses in the family have contributed to the exacerbation of her CHF. Discussed utilizing support structures, dietary modification, and weighing herself daily to monitor for fluid retention and avoid hospitalization. Counseled patient to make an appointment urgently if fluid retention is noted. Follow-up in 3 months to assess CHF status or sooner if needed. ? ?Rib pain on left side ?A recent CXR showed no evidence of fracture or tissue damage. Discussed avoiding heavy lifting and continuing Tylenol for symptomatic relief. Further discussion deferred due to gravity of CHF conversation. Will continue to follow up at future visits to assess pain. ? ?Investment banker, corporate, Medical Student ?[place]   ?

## 2022-01-06 NOTE — Assessment & Plan Note (Addendum)
A recent CXR showed no evidence of fracture or tissue damage. Discussed avoiding heavy lifting and continuing Tylenol for symptomatic relief. Further discussion deferred due to gravity of CHF conversation. Will continue to follow up at future visits to assess pain. ?

## 2022-01-07 ENCOUNTER — Encounter: Payer: Self-pay | Admitting: Family Medicine

## 2022-01-07 DIAGNOSIS — M8580 Other specified disorders of bone density and structure, unspecified site: Secondary | ICD-10-CM | POA: Diagnosis not present

## 2022-01-07 DIAGNOSIS — I110 Hypertensive heart disease with heart failure: Secondary | ICD-10-CM | POA: Diagnosis not present

## 2022-01-07 DIAGNOSIS — I4891 Unspecified atrial fibrillation: Secondary | ICD-10-CM | POA: Diagnosis not present

## 2022-01-07 DIAGNOSIS — N281 Cyst of kidney, acquired: Secondary | ICD-10-CM | POA: Diagnosis not present

## 2022-01-07 DIAGNOSIS — N12 Tubulo-interstitial nephritis, not specified as acute or chronic: Secondary | ICD-10-CM | POA: Diagnosis not present

## 2022-01-07 DIAGNOSIS — R319 Hematuria, unspecified: Secondary | ICD-10-CM | POA: Diagnosis not present

## 2022-01-07 DIAGNOSIS — I2510 Atherosclerotic heart disease of native coronary artery without angina pectoris: Secondary | ICD-10-CM | POA: Diagnosis not present

## 2022-01-07 DIAGNOSIS — Z7901 Long term (current) use of anticoagulants: Secondary | ICD-10-CM | POA: Diagnosis not present

## 2022-01-07 DIAGNOSIS — N179 Acute kidney failure, unspecified: Secondary | ICD-10-CM | POA: Diagnosis not present

## 2022-01-07 DIAGNOSIS — K219 Gastro-esophageal reflux disease without esophagitis: Secondary | ICD-10-CM | POA: Diagnosis not present

## 2022-01-07 DIAGNOSIS — Z7984 Long term (current) use of oral hypoglycemic drugs: Secondary | ICD-10-CM | POA: Diagnosis not present

## 2022-01-07 DIAGNOSIS — I5020 Unspecified systolic (congestive) heart failure: Secondary | ICD-10-CM | POA: Diagnosis not present

## 2022-01-07 DIAGNOSIS — M47892 Other spondylosis, cervical region: Secondary | ICD-10-CM | POA: Diagnosis not present

## 2022-01-07 DIAGNOSIS — Z9181 History of falling: Secondary | ICD-10-CM | POA: Diagnosis not present

## 2022-01-07 DIAGNOSIS — E785 Hyperlipidemia, unspecified: Secondary | ICD-10-CM | POA: Diagnosis not present

## 2022-01-07 DIAGNOSIS — E43 Unspecified severe protein-calorie malnutrition: Secondary | ICD-10-CM | POA: Diagnosis not present

## 2022-01-07 DIAGNOSIS — M722 Plantar fascial fibromatosis: Secondary | ICD-10-CM | POA: Diagnosis not present

## 2022-01-07 DIAGNOSIS — G4700 Insomnia, unspecified: Secondary | ICD-10-CM | POA: Diagnosis not present

## 2022-01-07 DIAGNOSIS — Z86711 Personal history of pulmonary embolism: Secondary | ICD-10-CM | POA: Diagnosis not present

## 2022-01-07 DIAGNOSIS — E1136 Type 2 diabetes mellitus with diabetic cataract: Secondary | ICD-10-CM | POA: Diagnosis not present

## 2022-01-07 DIAGNOSIS — I951 Orthostatic hypotension: Secondary | ICD-10-CM | POA: Diagnosis not present

## 2022-01-07 DIAGNOSIS — D649 Anemia, unspecified: Secondary | ICD-10-CM | POA: Diagnosis not present

## 2022-01-07 DIAGNOSIS — Z7982 Long term (current) use of aspirin: Secondary | ICD-10-CM | POA: Diagnosis not present

## 2022-01-07 DIAGNOSIS — Z794 Long term (current) use of insulin: Secondary | ICD-10-CM | POA: Diagnosis not present

## 2022-01-07 NOTE — Progress Notes (Signed)
Hospital FU for Acute on chronic HFrEF.  She feels great today.  She was diuresed in the hospital > 10lbs as best I can estimate from weights.  She denies ankle swelling.  Now able to lay flat.  Meds were tweaked in the hospital and she is on core measure HFrEF meds. ?We discussed daily weights at home as an early warning for fluid reaccumulation. ? ?We explored why she might have had this exacerbation and the conversation started with dietary indiscretion.  Her sister brought great insight in that patient has experience the death of two daughters and a sister in the last six months.  She has spent more time caring for others than caring for herself.  Patient became tearful.  She is obviously grieving during this tough time. ? ?She has good family and church support.  She did not feel that she needed meds or counseling.  We agreed on FU in 3-4 weeks to recheck her CHF and grief.   ?

## 2022-01-08 ENCOUNTER — Telehealth: Payer: Self-pay

## 2022-01-08 ENCOUNTER — Telehealth (HOSPITAL_COMMUNITY): Payer: Self-pay | Admitting: Cardiology

## 2022-01-08 DIAGNOSIS — Z7982 Long term (current) use of aspirin: Secondary | ICD-10-CM | POA: Diagnosis not present

## 2022-01-08 DIAGNOSIS — Z794 Long term (current) use of insulin: Secondary | ICD-10-CM | POA: Diagnosis not present

## 2022-01-08 DIAGNOSIS — M8580 Other specified disorders of bone density and structure, unspecified site: Secondary | ICD-10-CM | POA: Diagnosis not present

## 2022-01-08 DIAGNOSIS — Z7984 Long term (current) use of oral hypoglycemic drugs: Secondary | ICD-10-CM | POA: Diagnosis not present

## 2022-01-08 DIAGNOSIS — I110 Hypertensive heart disease with heart failure: Secondary | ICD-10-CM | POA: Diagnosis not present

## 2022-01-08 DIAGNOSIS — I4891 Unspecified atrial fibrillation: Secondary | ICD-10-CM | POA: Diagnosis not present

## 2022-01-08 DIAGNOSIS — E1136 Type 2 diabetes mellitus with diabetic cataract: Secondary | ICD-10-CM | POA: Diagnosis not present

## 2022-01-08 DIAGNOSIS — M722 Plantar fascial fibromatosis: Secondary | ICD-10-CM | POA: Diagnosis not present

## 2022-01-08 DIAGNOSIS — N12 Tubulo-interstitial nephritis, not specified as acute or chronic: Secondary | ICD-10-CM | POA: Diagnosis not present

## 2022-01-08 DIAGNOSIS — M47892 Other spondylosis, cervical region: Secondary | ICD-10-CM | POA: Diagnosis not present

## 2022-01-08 DIAGNOSIS — N281 Cyst of kidney, acquired: Secondary | ICD-10-CM | POA: Diagnosis not present

## 2022-01-08 DIAGNOSIS — I5020 Unspecified systolic (congestive) heart failure: Secondary | ICD-10-CM | POA: Diagnosis not present

## 2022-01-08 DIAGNOSIS — E43 Unspecified severe protein-calorie malnutrition: Secondary | ICD-10-CM | POA: Diagnosis not present

## 2022-01-08 DIAGNOSIS — E785 Hyperlipidemia, unspecified: Secondary | ICD-10-CM | POA: Diagnosis not present

## 2022-01-08 DIAGNOSIS — Z7901 Long term (current) use of anticoagulants: Secondary | ICD-10-CM | POA: Diagnosis not present

## 2022-01-08 DIAGNOSIS — R319 Hematuria, unspecified: Secondary | ICD-10-CM | POA: Diagnosis not present

## 2022-01-08 DIAGNOSIS — K219 Gastro-esophageal reflux disease without esophagitis: Secondary | ICD-10-CM | POA: Diagnosis not present

## 2022-01-08 DIAGNOSIS — N179 Acute kidney failure, unspecified: Secondary | ICD-10-CM | POA: Diagnosis not present

## 2022-01-08 DIAGNOSIS — I2510 Atherosclerotic heart disease of native coronary artery without angina pectoris: Secondary | ICD-10-CM | POA: Diagnosis not present

## 2022-01-08 DIAGNOSIS — Z9181 History of falling: Secondary | ICD-10-CM | POA: Diagnosis not present

## 2022-01-08 DIAGNOSIS — G4700 Insomnia, unspecified: Secondary | ICD-10-CM | POA: Diagnosis not present

## 2022-01-08 DIAGNOSIS — D649 Anemia, unspecified: Secondary | ICD-10-CM | POA: Diagnosis not present

## 2022-01-08 DIAGNOSIS — Z86711 Personal history of pulmonary embolism: Secondary | ICD-10-CM | POA: Diagnosis not present

## 2022-01-08 DIAGNOSIS — I951 Orthostatic hypotension: Secondary | ICD-10-CM | POA: Diagnosis not present

## 2022-01-08 NOTE — Telephone Encounter (Signed)
Urgent referral, please advise ?

## 2022-01-08 NOTE — Telephone Encounter (Signed)
Pt hospitalized w/HFrEF and d/c'd on 3/22, not sure why H&V TOC team was not consulted, will sch w/TOC Clinic  ?

## 2022-01-08 NOTE — Telephone Encounter (Signed)
Called Dana Coleman.  She will draw BMP next week.  Also instructed to hold lasix if weight drops below 110. ?

## 2022-01-08 NOTE — Telephone Encounter (Signed)
Amy [facility] RN calls nurse line to discuss weight.  ? ?Amy reports since the patient started lasix '80mg'$  her weight has been "falling off." Amy reports she was ~120lbs last week and today 114lbs at home visit.  ? ?Amy reports she was well appearing and her heart and lungs sounded great.  ? ?Amy is requesting weight parameters for patient and lasix dosing if appropriate. ? ?Amy reports she plans to do another home visit tomorrow for weight check.  ? ?Patient has an apt on 4/5 with heart failure clinic.  ? ?Will forward to provider who saw patient for FU and PCP.  ?

## 2022-01-09 ENCOUNTER — Telehealth: Payer: Self-pay | Admitting: *Deleted

## 2022-01-09 NOTE — Telephone Encounter (Signed)
Amy from [facility] calling for Home health verbal orders as follows: ? ?1 time(s) weekly for 9 week(s) for Med management and CHF management ? ?Verbal orders given per [facility] protocol ? ?Christen Bame, CMA ? ?

## 2022-01-10 ENCOUNTER — Ambulatory Visit (INDEPENDENT_AMBULATORY_CARE_PROVIDER_SITE_OTHER): Payer: Medicare Other | Admitting: Family Medicine

## 2022-01-10 DIAGNOSIS — I5020 Unspecified systolic (congestive) heart failure: Secondary | ICD-10-CM

## 2022-01-10 DIAGNOSIS — E1136 Type 2 diabetes mellitus with diabetic cataract: Secondary | ICD-10-CM | POA: Diagnosis not present

## 2022-01-10 DIAGNOSIS — Z794 Long term (current) use of insulin: Secondary | ICD-10-CM | POA: Diagnosis not present

## 2022-01-10 DIAGNOSIS — I110 Hypertensive heart disease with heart failure: Secondary | ICD-10-CM | POA: Diagnosis not present

## 2022-01-10 DIAGNOSIS — D649 Anemia, unspecified: Secondary | ICD-10-CM | POA: Diagnosis not present

## 2022-01-10 DIAGNOSIS — I951 Orthostatic hypotension: Secondary | ICD-10-CM | POA: Diagnosis not present

## 2022-01-10 DIAGNOSIS — E43 Unspecified severe protein-calorie malnutrition: Secondary | ICD-10-CM | POA: Diagnosis not present

## 2022-01-10 DIAGNOSIS — N281 Cyst of kidney, acquired: Secondary | ICD-10-CM | POA: Diagnosis not present

## 2022-01-10 DIAGNOSIS — I2510 Atherosclerotic heart disease of native coronary artery without angina pectoris: Secondary | ICD-10-CM | POA: Diagnosis not present

## 2022-01-10 DIAGNOSIS — Z7984 Long term (current) use of oral hypoglycemic drugs: Secondary | ICD-10-CM | POA: Diagnosis not present

## 2022-01-10 DIAGNOSIS — Z7901 Long term (current) use of anticoagulants: Secondary | ICD-10-CM | POA: Diagnosis not present

## 2022-01-10 DIAGNOSIS — I4891 Unspecified atrial fibrillation: Secondary | ICD-10-CM | POA: Diagnosis not present

## 2022-01-10 DIAGNOSIS — E785 Hyperlipidemia, unspecified: Secondary | ICD-10-CM | POA: Diagnosis not present

## 2022-01-10 DIAGNOSIS — Z7982 Long term (current) use of aspirin: Secondary | ICD-10-CM | POA: Diagnosis not present

## 2022-01-10 DIAGNOSIS — K219 Gastro-esophageal reflux disease without esophagitis: Secondary | ICD-10-CM | POA: Diagnosis not present

## 2022-01-10 DIAGNOSIS — Z9181 History of falling: Secondary | ICD-10-CM | POA: Diagnosis not present

## 2022-01-10 DIAGNOSIS — N12 Tubulo-interstitial nephritis, not specified as acute or chronic: Secondary | ICD-10-CM | POA: Diagnosis not present

## 2022-01-10 DIAGNOSIS — R319 Hematuria, unspecified: Secondary | ICD-10-CM | POA: Diagnosis not present

## 2022-01-10 DIAGNOSIS — M8580 Other specified disorders of bone density and structure, unspecified site: Secondary | ICD-10-CM | POA: Diagnosis not present

## 2022-01-10 DIAGNOSIS — M722 Plantar fascial fibromatosis: Secondary | ICD-10-CM | POA: Diagnosis not present

## 2022-01-10 DIAGNOSIS — N179 Acute kidney failure, unspecified: Secondary | ICD-10-CM | POA: Diagnosis not present

## 2022-01-10 DIAGNOSIS — M47892 Other spondylosis, cervical region: Secondary | ICD-10-CM | POA: Diagnosis not present

## 2022-01-10 DIAGNOSIS — G4700 Insomnia, unspecified: Secondary | ICD-10-CM | POA: Diagnosis not present

## 2022-01-10 DIAGNOSIS — Z86711 Personal history of pulmonary embolism: Secondary | ICD-10-CM | POA: Diagnosis not present

## 2022-01-10 NOTE — Telephone Encounter (Signed)
Amy [facility] RN calls nurse line this morning in regards to patients weight and lasix.  ? ?Amy reported the patient called her yesterday stating she felt very "sluggish" and reported a weight of 112lbs. Amy advised her to hold lasix dose yesterday.  ? ?Amy reports today she called patient to check in. Patient reports she feels much better today and reports a weight of 113lbs.  ? ?Amy reports she does not feel comfortable "tweaking" her lasix dosage today or through the weekend. Amy is requesting additional parameters.  ? ?Advised Amy it would be best if patient came in this afternoon to discuss with a provider.  ? ?Patient contacted and scheduled for this afternoon.  ?

## 2022-01-10 NOTE — Patient Instructions (Signed)
It was a pleasure to see you today! ? ?You should keep your current dose of lasix of 80 mg because this is what your kidneys respond to, however, see the instructions below ?Dose your lasix based on two factors: weight and symptoms ?Weight: your "dry" weight (without excess fluid from heart failure) appears to be 115-118 lbs right now. If you are at or above this weight, then take your lasix that day ?Symptoms: if you have shortness of breath or swollen legs, take your lasix ?If you still have these symptoms and lasix is not helping, follow up at our office ? ?Be Well, ? ?Dr. Chauncey Reading ? ?

## 2022-01-10 NOTE — Progress Notes (Signed)
? ? ?  SUBJECTIVE:  ? ?CHIEF COMPLAINT / HPI:  ? ?HFrEF: patient has known HFrEF, dry weight estimated to be 115-118; recently admitted to hospital for CHF exacerbation. Current regimen is lasix 80 mg qd, entresto 49-51 BID. Metoprolol tartrate and spironolactone discontinued in hospital. Patient' returns for evaluation today because she felt very fatigued yesterday and her weight was down to 113 lbs. Her home nurse was concerned and unsure if she should give patient her diuretic; she did not take it yesterday. Today patient reports that she feels fine, her weight was 115 at home today, she has no dyspnea, and no peripheral edema. No fever, chills, cough, runny nose, sore throat, no n/v/d.  ? ?PERTINENT  PMH / PSH: HFrEF ? ?OBJECTIVE:  ? ?BP (!) 146/85   Pulse (!) 57   Wt 116 lb 12.8 oz (53 kg)   SpO2 98%   BMI 21.36 kg/m?   ?Nursing note and vitals reviewed ?GEN: elderly AAW, resting comfortably in chair, NAD, thin-appearing ?HEENT: NCAT. PERRLA. Sclera without injection or icterus. MMM.  ?Neck: Supple. No LAD, no JVD ?Cardiac: Regular rate and rhythm. Normal S1/S2. No murmurs, rubs, or gallops appreciated. 2+ radial pulses. ?Lungs: Clear bilaterally to ascultation. No increased WOB, no accessory muscle usage. No w/r/r. ?Neuro: AOx3  ?Ext: no edema ?Psych: Pleasant and appropriate  ?ASSESSMENT/PLAN:  ? ?Heart failure with reduced ejection fraction ([place]) ?Patient with recent titration of heart failure meds in the hospital, best lasix dose found to be 80 mg qd. Nurse concerned as she was unsure when to give diuretic or hold it based on patient's weight. Dry weight appears to be 115-118 lbs. It appears that patient was likely a little dry yesterday and possibly over-diuresed, which could account for fatigue. I discussed with patient and her family member with her and created HF treatment plan: ?- Green zone: if patient has no sx of dyspnea, no peripheral edema and her weight is under 115 lbs, OK to skip diuretic.  If she has no symptoms and weight is above 115 lbs, give diuretic.  ?- Yellow zone: some symptoms such as peripheral edema or mild dyspnea and weight is 115-118 lbs or greater, give diuretic  ?- Red zone: peripheral edema present, dyspnea constant, weight >118 lbs and taking medication, call our office or go to ED. ?  ? ? ?Gladys Damme, MD ?[place]  ? ?

## 2022-01-10 NOTE — Telephone Encounter (Signed)
Agree with being seen.  Dry weight estimated to be 115-118.  Likely will need weight parameters: perhaps hold lasix when weight is below 115? ? ?

## 2022-01-13 ENCOUNTER — Telehealth: Payer: Self-pay

## 2022-01-13 NOTE — Telephone Encounter (Signed)
[facility] calls nurse line requesting verbal orders for [facility] PT as follows.  ? ?1x a week for 4 weeks.  ? ?Verbal order given.  ?

## 2022-01-14 DIAGNOSIS — Z7982 Long term (current) use of aspirin: Secondary | ICD-10-CM | POA: Diagnosis not present

## 2022-01-14 DIAGNOSIS — Z794 Long term (current) use of insulin: Secondary | ICD-10-CM | POA: Diagnosis not present

## 2022-01-14 DIAGNOSIS — I110 Hypertensive heart disease with heart failure: Secondary | ICD-10-CM | POA: Diagnosis not present

## 2022-01-14 DIAGNOSIS — D649 Anemia, unspecified: Secondary | ICD-10-CM | POA: Diagnosis not present

## 2022-01-14 DIAGNOSIS — N12 Tubulo-interstitial nephritis, not specified as acute or chronic: Secondary | ICD-10-CM | POA: Diagnosis not present

## 2022-01-14 DIAGNOSIS — I5020 Unspecified systolic (congestive) heart failure: Secondary | ICD-10-CM | POA: Diagnosis not present

## 2022-01-14 DIAGNOSIS — Z7984 Long term (current) use of oral hypoglycemic drugs: Secondary | ICD-10-CM | POA: Diagnosis not present

## 2022-01-14 DIAGNOSIS — E785 Hyperlipidemia, unspecified: Secondary | ICD-10-CM | POA: Diagnosis not present

## 2022-01-14 DIAGNOSIS — Z9181 History of falling: Secondary | ICD-10-CM | POA: Diagnosis not present

## 2022-01-14 DIAGNOSIS — G4700 Insomnia, unspecified: Secondary | ICD-10-CM | POA: Diagnosis not present

## 2022-01-14 DIAGNOSIS — E43 Unspecified severe protein-calorie malnutrition: Secondary | ICD-10-CM | POA: Diagnosis not present

## 2022-01-14 DIAGNOSIS — K219 Gastro-esophageal reflux disease without esophagitis: Secondary | ICD-10-CM | POA: Diagnosis not present

## 2022-01-14 DIAGNOSIS — N281 Cyst of kidney, acquired: Secondary | ICD-10-CM | POA: Diagnosis not present

## 2022-01-14 DIAGNOSIS — Z86711 Personal history of pulmonary embolism: Secondary | ICD-10-CM | POA: Diagnosis not present

## 2022-01-14 DIAGNOSIS — I2510 Atherosclerotic heart disease of native coronary artery without angina pectoris: Secondary | ICD-10-CM | POA: Diagnosis not present

## 2022-01-14 DIAGNOSIS — M8580 Other specified disorders of bone density and structure, unspecified site: Secondary | ICD-10-CM | POA: Diagnosis not present

## 2022-01-14 DIAGNOSIS — E1136 Type 2 diabetes mellitus with diabetic cataract: Secondary | ICD-10-CM | POA: Diagnosis not present

## 2022-01-14 DIAGNOSIS — Z7901 Long term (current) use of anticoagulants: Secondary | ICD-10-CM | POA: Diagnosis not present

## 2022-01-14 DIAGNOSIS — M47892 Other spondylosis, cervical region: Secondary | ICD-10-CM | POA: Diagnosis not present

## 2022-01-14 DIAGNOSIS — R319 Hematuria, unspecified: Secondary | ICD-10-CM | POA: Diagnosis not present

## 2022-01-14 DIAGNOSIS — N179 Acute kidney failure, unspecified: Secondary | ICD-10-CM | POA: Diagnosis not present

## 2022-01-14 DIAGNOSIS — I4891 Unspecified atrial fibrillation: Secondary | ICD-10-CM | POA: Diagnosis not present

## 2022-01-14 DIAGNOSIS — M722 Plantar fascial fibromatosis: Secondary | ICD-10-CM | POA: Diagnosis not present

## 2022-01-14 DIAGNOSIS — I951 Orthostatic hypotension: Secondary | ICD-10-CM | POA: Diagnosis not present

## 2022-01-15 ENCOUNTER — Telehealth (HOSPITAL_COMMUNITY): Payer: Self-pay | Admitting: *Deleted

## 2022-01-15 ENCOUNTER — Ambulatory Visit (HOSPITAL_COMMUNITY)
Admission: RE | Admit: 2022-01-15 | Discharge: 2022-01-15 | Disposition: A | Discharge status: Home / Self Care | Payer: Medicare Other | Source: Ambulatory Visit | Attending: Cardiology | Admitting: Cardiology

## 2022-01-15 ENCOUNTER — Other Ambulatory Visit (HOSPITAL_COMMUNITY): Payer: Self-pay

## 2022-01-15 VITALS — BP 125/67 | HR 58 | Wt 119.6 lb

## 2022-01-15 DIAGNOSIS — I110 Hypertensive heart disease with heart failure: Secondary | ICD-10-CM | POA: Diagnosis not present

## 2022-01-15 DIAGNOSIS — Z794 Long term (current) use of insulin: Secondary | ICD-10-CM | POA: Diagnosis not present

## 2022-01-15 DIAGNOSIS — Z8249 Family history of ischemic heart disease and other diseases of the circulatory system: Secondary | ICD-10-CM | POA: Diagnosis not present

## 2022-01-15 DIAGNOSIS — Z923 Personal history of irradiation: Secondary | ICD-10-CM | POA: Insufficient documentation

## 2022-01-15 DIAGNOSIS — Z7901 Long term (current) use of anticoagulants: Secondary | ICD-10-CM | POA: Diagnosis not present

## 2022-01-15 DIAGNOSIS — I252 Old myocardial infarction: Secondary | ICD-10-CM | POA: Diagnosis not present

## 2022-01-15 DIAGNOSIS — Z9011 Acquired absence of right breast and nipple: Secondary | ICD-10-CM | POA: Insufficient documentation

## 2022-01-15 DIAGNOSIS — Z955 Presence of coronary angioplasty implant and graft: Secondary | ICD-10-CM | POA: Diagnosis not present

## 2022-01-15 DIAGNOSIS — I5082 Biventricular heart failure: Secondary | ICD-10-CM | POA: Diagnosis not present

## 2022-01-15 DIAGNOSIS — E785 Hyperlipidemia, unspecified: Secondary | ICD-10-CM | POA: Diagnosis not present

## 2022-01-15 DIAGNOSIS — I5022 Chronic systolic (congestive) heart failure: Secondary | ICD-10-CM | POA: Insufficient documentation

## 2022-01-15 DIAGNOSIS — Z87440 Personal history of urinary (tract) infections: Secondary | ICD-10-CM | POA: Insufficient documentation

## 2022-01-15 DIAGNOSIS — I2510 Atherosclerotic heart disease of native coronary artery without angina pectoris: Secondary | ICD-10-CM | POA: Insufficient documentation

## 2022-01-15 DIAGNOSIS — E119 Type 2 diabetes mellitus without complications: Secondary | ICD-10-CM | POA: Diagnosis not present

## 2022-01-15 DIAGNOSIS — Z79899 Other long term (current) drug therapy: Secondary | ICD-10-CM | POA: Diagnosis not present

## 2022-01-15 DIAGNOSIS — Z86711 Personal history of pulmonary embolism: Secondary | ICD-10-CM | POA: Insufficient documentation

## 2022-01-15 DIAGNOSIS — I480 Paroxysmal atrial fibrillation: Secondary | ICD-10-CM | POA: Diagnosis not present

## 2022-01-15 DIAGNOSIS — Z853 Personal history of malignant neoplasm of breast: Secondary | ICD-10-CM | POA: Diagnosis not present

## 2022-01-15 DIAGNOSIS — I4510 Unspecified right bundle-branch block: Secondary | ICD-10-CM | POA: Insufficient documentation

## 2022-01-15 DIAGNOSIS — R001 Bradycardia, unspecified: Secondary | ICD-10-CM | POA: Insufficient documentation

## 2022-01-15 LAB — BRAIN NATRIURETIC PEPTIDE: B Natriuretic Peptide: 2244.8 pg/mL — ABNORMAL HIGH (ref 0.0–100.0)

## 2022-01-15 LAB — BASIC METABOLIC PANEL
Anion gap: 8 (ref 5–15)
BUN: 17 mg/dL (ref 8–23)
CO2: 25 mmol/L (ref 22–32)
Calcium: 9.6 mg/dL (ref 8.9–10.3)
Chloride: 106 mmol/L (ref 98–111)
Creatinine, Ser: 1.15 mg/dL — ABNORMAL HIGH (ref 0.44–1.00)
GFR, Estimated: 48 mL/min — ABNORMAL LOW (ref >60)
Glucose, Bld: 130 mg/dL — ABNORMAL HIGH (ref 70–99)
Potassium: 4.9 mmol/L (ref 3.5–5.1)
Sodium: 139 mmol/L (ref 135–145)

## 2022-01-15 MED ORDER — FUROSEMIDE 80 MG PO TABS: 40.0000 mg | ORAL_TABLET | ORAL | 6 refills | Status: DC | PRN

## 2022-01-15 MED ORDER — ENTRESTO 97-103 MG PO TABS: 1.0000 | ORAL_TABLET | Freq: Two times a day (BID) | ORAL | 11 refills | Status: DC

## 2022-01-15 NOTE — Progress Notes (Signed)
? ? ?HEART & VASCULAR TRANSITION OF CARE CONSULT NOTE  ? ? ? ?Referring Physician: Dr. Madison Hickman, Family Medicine  ?Primary Care:Cresenzo, Christy Sartorius, MD  ?Primary Cardiologist: Dr. Lyla Son  ? ?HPI: ?Referred to clinic by Dr. Madison Hickman for heart failure consultation.  ? ?81 y/o female w/ h/o CAD, s/p remote inferior wall MI in 2000 treated w/ PCI to RCA, hypertension, type 2DM, HLD and h/o right sided breast cancer treated w/ radiation and mastectomy, no chemotherapy.  ? ?Followed by Dr. Terrence Dupont ?Echo 2019 EF 55-60%, G1D, RV normal  ?NST 2019 c/w prior inferior myocardial infarction. Small mild distal anterior/apical infarct with mild peri-infarct ischemia>> Low risk study  ? ?She was extremely sick 04/2021 w/ nearly 6 wk hospitalization. She had laparoscopic cholecystectomy on 05/02/21, discharged home, developed abdominal pain associated with sudden onset of altered mental status noted to be hypotensive, bradycardic and hypoxic by EMS requiring intubation and subsequently was noted to have a right empyema and perforation of the esophagus requiring emergent thoracotomy w/ repair of esophageal perforation with intercostal pedicled muscle flap, drainage of empyema and decortication of right lung on 7/24.  ? ?7/25, she developed EKG changes w/ minimal inferior ST elevation and troponin leak. Subsequent 2-D echo showed moderately reduced LVEF, 40%, and severely reduced RV systolic function. However given recent surgery, she was not a candidate for intervention. ? ?8/8 developed increased O2 requirements, subsequently had CT angiogram which showed bilateral pulmonary embolism.  Repeat limited echo showed Limited Echo 05/2021 EF 25-30%, RV normal. No evidence of significant strain>>>IV heparin>>Eliquis.  ? ?Hospitalization also notable for atrial flutter. Dr. Terrence Dupont followed during hospitalization.  ? ?She recovered and had been doing fairly well. Lives at home alone, but sister and brother live down the street and help when  needed. Ambulates w/ walker. Chronically NYHA Class II-early III. Depends on family for grocery shopping and picking up mail from [place].  ? ?Of note EKG 1/23 showed afib w/ CVR. F/u EKGs 3/23 showed NSR. ? ?Recent admit 3/23 for a/c CHF. Was in NSR during admission. Echo EF 25-30%, RV moderately reduced right ventricular systolic pressure 46.6 mmHg, mild MR. Diuresed 9 lb down to a dry wt of 119 lb. Discharged home on lasix, Entresto and Toprol XL.  ? ?Had post hospital f/u w/ PCP last week.. There was concern for over diuresis. Instructed to change lasix from daily to PRN based on if wt >119 lb. ? ?Presents to [facility] today for assessment. Here w/ her sister. Denies resting dyspnea. No orthopnea/PND. No LEE. Continues w/ exertional dyspnea, NYHA Class II-early III. Reports full med compliance. Tolerating well w/o side effects. BP 125/67. EKG shows SB w/ RBBB, 50 bpm. Denies dizziness. No recent CP. ReDs Clip 25%.  ? ? ?Cardiac Testing  ? ?2D Echo 12/2017 EF 55-60%, G1D, RV normal  ? ?NST 12/2017  ?There was no ST segment deviation noted during stress. ?T wave inversion was noted during stress in the III leads, beginning at 1 minutes of stress. T wave inversion persisted. ?Findings consistent with prior inferior myocardial infarction. Small mild distal anterior/apical infarct with mild peri-infarct ischemia. ?This is a low risk study. Mild area of current ischemia. ?The left ventricular ejection fraction is normal (55-65%). ? ? ?Echo 04/2021 EF 45%, RV severely reduced  ?Limited Echo 05/2021 EF 25-30%, RV normal  ?Echo 12/2021 EF 25-30%, RV moderately reduced right ventricular systolic pressure is 59.9 mmHg, mild MR  ? ?Review of Systems: [y] = yes, '[ ]'$  = no  ? ?  General: Weight gain '[ ]'$ ; Weight loss '[ ]'$ ; Anorexia '[ ]'$ ; Fatigue '[ ]'$ ; Fever '[ ]'$ ; Chills '[ ]'$ ; Weakness '[ ]'$   ?Cardiac: Chest pain/pressure '[ ]'$ ; Resting SOB '[ ]'$ ; Exertional SOB '[ ]'$ ; Orthopnea '[ ]'$ ; Pedal Edema '[ ]'$ ; Palpitations '[ ]'$ ; Syncope '[ ]'$ ; Presyncope '[ ]'$ ;  Paroxysmal nocturnal dyspnea'[ ]'$   ?Pulmonary: Cough '[ ]'$ ; Wheezing'[ ]'$ ; Hemoptysis'[ ]'$ ; Sputum '[ ]'$ ; Snoring '[ ]'$   ?GI: Vomiting'[ ]'$ ; Dysphagia'[ ]'$ ; Melena'[ ]'$ ; Hematochezia '[ ]'$ ; Heartburn'[ ]'$ ; Abdominal pain '[ ]'$ ; Constipation '[ ]'$ ; Diarrhea '[ ]'$ ; BRBPR '[ ]'$   ?GU: Hematuria'[ ]'$ ; Dysuria '[ ]'$ ; Nocturia'[ ]'$   ?Vascular: Pain in legs with walking '[ ]'$ ; Pain in feet with lying flat '[ ]'$ ; Non-healing sores '[ ]'$ ; Stroke '[ ]'$ ; TIA '[ ]'$ ; Slurred speech '[ ]'$ ;  ?Neuro: Headaches'[ ]'$ ; Vertigo'[ ]'$ ; Seizures'[ ]'$ ; Paresthesias'[ ]'$ ;Blurred vision '[ ]'$ ; Diplopia '[ ]'$ ; Vision changes '[ ]'$   ?Ortho/Skin: Arthritis '[ ]'$ ; Joint pain '[ ]'$ ; Muscle pain '[ ]'$ ; Joint swelling '[ ]'$ ; Back Pain '[ ]'$ ; Rash '[ ]'$   ?Psych: Depression'[ ]'$ ; Anxiety'[ ]'$   ?Heme: Bleeding problems '[ ]'$ ; Clotting disorders '[ ]'$ ; Anemia '[ ]'$   ?Endocrine: Diabetes '[ ]'$ ; Thyroid dysfunction'[ ]'$  ? ? ?Past Medical History:  ?Diagnosis Date  ? Allergy   ? occ uses OTC allergy meds   ? Arthritis   ? fingers  ? Breast cancer ([place]) 04/2008  ? Right breast  ? Cataracts, bilateral   ? removed bilat   ? Cervical cancer ([place])   ? When the patient was in her 48s  ? Depression   ? Diabetes mellitus without complication ([place])   ? Heart attack [facility]) 2004  ? 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 Hyperlipidemia   ? Hypertension   ? Osteopenia 12/10/2006  ? On vitamin D per oncology Dr. Truddie Coco  ? PEG (percutaneous endoscopic gastrostomy) status ([place]) 07/23/2021  ? Personal history of radiation therapy 2009  ? Respiratory failure ([place]) 05/04/2021  ? ? ?Current Outpatient Medications  ?Medication Sig Dispense Refill  ? acetaminophen (TYLENOL) 500 MG tablet Take 500 mg by mouth every 6 (six) hours as needed for moderate pain.    ? amiodarone (PACERONE) 200 MG tablet Take 100 mg by mouth daily.    ? aspirin EC 81 MG tablet Take 81 mg by mouth in the morning. Swallow whole.    ? Blood Glucose Monitoring Suppl MISC daily.    ? ELIQUIS 5 MG TABS tablet TAKE 1 TABLET BY MOUTH TWICE A DAY 60 tablet 0  ? glucose blood (ONE TOUCH ULTRA TEST) test strip  CHECK BLOOD SUGAR TWICE DAILY 100 each 1  ? insulin glargine (LANTUS SOLOSTAR) 100 UNIT/ML Solostar Pen Inject 6 Units into the skin in the morning.    ? Insulin Pen Needle (B-D UF III MINI PEN NEEDLES) 31G X 5 MM MISC INJECT INSULIN VIA PEN 6 TIMES DAILY 200 each 3  ? Melatonin 3 MG TBDP Take 6 mg by mouth at bedtime as needed (sleep).    ? metoprolol succinate (TOPROL-XL) 25 MG 24 hr tablet Take 1 tablet (25 mg total) by mouth daily. 30 tablet 0  ? omeprazole (PRILOSEC) 20 MG capsule Take 40 mg by mouth every morning.    ? ondansetron (ZOFRAN) 4 MG tablet Take 1 tablet (4 mg total) by mouth daily as needed for nausea or vomiting. 30 tablet 1  ? OneTouch Delica Lancets 19J MISC 1 Container by Does not apply route as needed.  100 each PRN  ? rosuvastatin (CRESTOR) 20 MG tablet TAKE 1 TABLET BY MOUTH EVERYDAY AT BEDTIME 90 tablet 1  ? sacubitril-valsartan (ENTRESTO) 97-103 MG Take 1 tablet by mouth 2 (two) times daily. 60 tablet 11  ? traZODone (DESYREL) 50 MG tablet Take 50 mg by mouth at bedtime as needed for sleep.    ? furosemide (LASIX) 80 MG tablet Take 0.5 tablets (40 mg total) by mouth as needed for fluid or edema. 30 tablet 6  ? ?No current facility-administered medications for this encounter.  ? ? ?No Known Allergies ? ?  ?Social History  ? ?Socioeconomic History  ? Marital status: Divorced  ?  Spouse name: Not on file  ? Number of children: 5  ? Years of education: 10  ? Highest education level: 10th grade  ?Occupational History  ? Occupation: Retired- IT trainer  ?Tobacco Use  ? Smoking status: Never  ? Smokeless tobacco: Never  ? Tobacco comments:  ?  No plans to start  ?Vaping Use  ? Vaping Use: Never used  ?Substance and Sexual Activity  ? Alcohol use: No  ? Drug use: No  ? Sexual activity: Not Currently  ?  Birth control/protection: Condom  ?Other Topics Concern  ? Not on file  ?Social History Narrative  ? Patient lives alone in [place].  ? Patient is close with daughters Solmon Ice and  [place]. One Son in [place]. 2 children have passed.   ? Patient depends on her sister and brother for support and any assistance needed.   ? [name] (sister) [phone].  ? Religious / Personal Beliefs: Bapt

## 2022-01-15 NOTE — Progress Notes (Signed)
ReDS Vest / Clip - 01/15/22 1500   ? ?  ? ReDS Vest / Clip  ? Station Marker A   ? Ruler Value 23   ? ReDS Value Range Low volume   ? ReDS Actual Value 25   ? ?  ?  ? ?  ? ? ?

## 2022-01-15 NOTE — Telephone Encounter (Signed)
Heart Failure Nurse Navigator Progress Note  ? ?Unable to confirm appointment with patient, no option for voicemail for [place] on 01/15/22 @ 3pm. ? ? ?Earnestine Leys, BSN, RN ?Heart Failure Nurse Navigator ?[phone]  ?

## 2022-01-15 NOTE — Patient Instructions (Signed)
Increase Entresto to 97/103 Twice daily ? ?Furosemide (Lasix) 40 mg As needed for swelling or weight gain of 3lb in 24 hours. ? ?Labs done today, your results will be available in MyChart, we will contact you for abnormal readings. ? ?Your physician recommends that you schedule a follow-up appointment in: 3 weeks ? ?Do the following things EVERYDAY: ?Weigh yourself in the morning before breakfast. Write it down and keep it in a log. ?Take your medicines as prescribed ?Eat low salt foods--Limit salt (sodium) to 2000 mg per day.  ?Stay as active as you can everyday ?Limit all fluids for the day to less than 2 liters ? ?If you have any questions, issues, or concerns before your next appointment please call our office at [phone], opt. 2 and leave a message for the triage nurse. ? ? ? ?

## 2022-01-17 ENCOUNTER — Telehealth (HOSPITAL_COMMUNITY): Payer: Self-pay | Admitting: Surgery

## 2022-01-17 ENCOUNTER — Other Ambulatory Visit (HOSPITAL_COMMUNITY): Payer: Self-pay | Admitting: Surgery

## 2022-01-17 DIAGNOSIS — I2510 Atherosclerotic heart disease of native coronary artery without angina pectoris: Secondary | ICD-10-CM | POA: Diagnosis not present

## 2022-01-17 DIAGNOSIS — E43 Unspecified severe protein-calorie malnutrition: Secondary | ICD-10-CM | POA: Diagnosis not present

## 2022-01-17 DIAGNOSIS — E1136 Type 2 diabetes mellitus with diabetic cataract: Secondary | ICD-10-CM | POA: Diagnosis not present

## 2022-01-17 DIAGNOSIS — D649 Anemia, unspecified: Secondary | ICD-10-CM | POA: Diagnosis not present

## 2022-01-17 DIAGNOSIS — G4700 Insomnia, unspecified: Secondary | ICD-10-CM | POA: Diagnosis not present

## 2022-01-17 DIAGNOSIS — Z794 Long term (current) use of insulin: Secondary | ICD-10-CM | POA: Diagnosis not present

## 2022-01-17 DIAGNOSIS — Z7982 Long term (current) use of aspirin: Secondary | ICD-10-CM | POA: Diagnosis not present

## 2022-01-17 DIAGNOSIS — M722 Plantar fascial fibromatosis: Secondary | ICD-10-CM | POA: Diagnosis not present

## 2022-01-17 DIAGNOSIS — Z86711 Personal history of pulmonary embolism: Secondary | ICD-10-CM | POA: Diagnosis not present

## 2022-01-17 DIAGNOSIS — Z9181 History of falling: Secondary | ICD-10-CM | POA: Diagnosis not present

## 2022-01-17 DIAGNOSIS — M8580 Other specified disorders of bone density and structure, unspecified site: Secondary | ICD-10-CM | POA: Diagnosis not present

## 2022-01-17 DIAGNOSIS — I5022 Chronic systolic (congestive) heart failure: Secondary | ICD-10-CM

## 2022-01-17 DIAGNOSIS — K219 Gastro-esophageal reflux disease without esophagitis: Secondary | ICD-10-CM | POA: Diagnosis not present

## 2022-01-17 DIAGNOSIS — I110 Hypertensive heart disease with heart failure: Secondary | ICD-10-CM | POA: Diagnosis not present

## 2022-01-17 DIAGNOSIS — E785 Hyperlipidemia, unspecified: Secondary | ICD-10-CM | POA: Diagnosis not present

## 2022-01-17 DIAGNOSIS — Z7901 Long term (current) use of anticoagulants: Secondary | ICD-10-CM | POA: Diagnosis not present

## 2022-01-17 DIAGNOSIS — I5020 Unspecified systolic (congestive) heart failure: Secondary | ICD-10-CM | POA: Diagnosis not present

## 2022-01-17 DIAGNOSIS — I4891 Unspecified atrial fibrillation: Secondary | ICD-10-CM | POA: Diagnosis not present

## 2022-01-17 NOTE — Telephone Encounter (Signed)
Patient called to review results and recommendations per Lyda Jester PA.  She will take 2 days of Lasix 80 mg however is currently taking 97/103 mg  of Entresto.  I advised that we would call back with further instruction if necessary.  She will return on Friday April 14th for repeat labwork. ?

## 2022-01-17 NOTE — Telephone Encounter (Signed)
-----   Message from Consuelo Pandy, [name] sent at 01/15/2022  5:12 PM EDT ----- ?BNP still elevated but trending down. ? ?Renal function stable. K 3.9 ? ?Recommend she take lasix 80 mg daily x the next 2 days, then back to PRN. ? ?Increase Entresto to 49-51.  ? ?Repeat BMP and BNP in 1 wk  ?

## 2022-01-17 NOTE — Progress Notes (Signed)
Added lab orders. 

## 2022-01-20 ENCOUNTER — Telehealth (HOSPITAL_COMMUNITY): Payer: Self-pay | Admitting: *Deleted

## 2022-01-20 DIAGNOSIS — Z9181 History of falling: Secondary | ICD-10-CM | POA: Diagnosis not present

## 2022-01-20 DIAGNOSIS — I110 Hypertensive heart disease with heart failure: Secondary | ICD-10-CM | POA: Diagnosis not present

## 2022-01-20 DIAGNOSIS — Z7982 Long term (current) use of aspirin: Secondary | ICD-10-CM | POA: Diagnosis not present

## 2022-01-20 DIAGNOSIS — M722 Plantar fascial fibromatosis: Secondary | ICD-10-CM | POA: Diagnosis not present

## 2022-01-20 DIAGNOSIS — I2510 Atherosclerotic heart disease of native coronary artery without angina pectoris: Secondary | ICD-10-CM | POA: Diagnosis not present

## 2022-01-20 DIAGNOSIS — Z7901 Long term (current) use of anticoagulants: Secondary | ICD-10-CM | POA: Diagnosis not present

## 2022-01-20 DIAGNOSIS — E785 Hyperlipidemia, unspecified: Secondary | ICD-10-CM | POA: Diagnosis not present

## 2022-01-20 DIAGNOSIS — Z794 Long term (current) use of insulin: Secondary | ICD-10-CM | POA: Diagnosis not present

## 2022-01-20 DIAGNOSIS — E1136 Type 2 diabetes mellitus with diabetic cataract: Secondary | ICD-10-CM | POA: Diagnosis not present

## 2022-01-20 DIAGNOSIS — D649 Anemia, unspecified: Secondary | ICD-10-CM | POA: Diagnosis not present

## 2022-01-20 DIAGNOSIS — E43 Unspecified severe protein-calorie malnutrition: Secondary | ICD-10-CM | POA: Diagnosis not present

## 2022-01-20 DIAGNOSIS — G4700 Insomnia, unspecified: Secondary | ICD-10-CM | POA: Diagnosis not present

## 2022-01-20 DIAGNOSIS — M8580 Other specified disorders of bone density and structure, unspecified site: Secondary | ICD-10-CM | POA: Diagnosis not present

## 2022-01-20 DIAGNOSIS — I5020 Unspecified systolic (congestive) heart failure: Secondary | ICD-10-CM | POA: Diagnosis not present

## 2022-01-20 DIAGNOSIS — I4891 Unspecified atrial fibrillation: Secondary | ICD-10-CM | POA: Diagnosis not present

## 2022-01-20 DIAGNOSIS — K219 Gastro-esophageal reflux disease without esophagitis: Secondary | ICD-10-CM | POA: Diagnosis not present

## 2022-01-20 DIAGNOSIS — Z86711 Personal history of pulmonary embolism: Secondary | ICD-10-CM | POA: Diagnosis not present

## 2022-01-20 NOTE — Telephone Encounter (Signed)
Pts home health RN Amy called to report pts weight is better. Pt is now 114lbs but pt is c/o low energy. Pts bp 150/70 heart rate 54bpm.  Amy asked if we wanted to decrease her metoprolol or make any other changes.  ? ?Routed to [employer] ? ?Call back # [phone] ?

## 2022-01-22 DIAGNOSIS — D649 Anemia, unspecified: Secondary | ICD-10-CM | POA: Diagnosis not present

## 2022-01-22 DIAGNOSIS — E1136 Type 2 diabetes mellitus with diabetic cataract: Secondary | ICD-10-CM | POA: Diagnosis not present

## 2022-01-22 DIAGNOSIS — I4891 Unspecified atrial fibrillation: Secondary | ICD-10-CM | POA: Diagnosis not present

## 2022-01-22 DIAGNOSIS — M722 Plantar fascial fibromatosis: Secondary | ICD-10-CM | POA: Diagnosis not present

## 2022-01-22 DIAGNOSIS — E785 Hyperlipidemia, unspecified: Secondary | ICD-10-CM | POA: Diagnosis not present

## 2022-01-22 DIAGNOSIS — I5020 Unspecified systolic (congestive) heart failure: Secondary | ICD-10-CM | POA: Diagnosis not present

## 2022-01-22 DIAGNOSIS — G4700 Insomnia, unspecified: Secondary | ICD-10-CM | POA: Diagnosis not present

## 2022-01-22 DIAGNOSIS — M8580 Other specified disorders of bone density and structure, unspecified site: Secondary | ICD-10-CM | POA: Diagnosis not present

## 2022-01-22 DIAGNOSIS — E43 Unspecified severe protein-calorie malnutrition: Secondary | ICD-10-CM | POA: Diagnosis not present

## 2022-01-22 DIAGNOSIS — I110 Hypertensive heart disease with heart failure: Secondary | ICD-10-CM | POA: Diagnosis not present

## 2022-01-22 DIAGNOSIS — Z7901 Long term (current) use of anticoagulants: Secondary | ICD-10-CM | POA: Diagnosis not present

## 2022-01-22 DIAGNOSIS — Z7982 Long term (current) use of aspirin: Secondary | ICD-10-CM | POA: Diagnosis not present

## 2022-01-22 DIAGNOSIS — Z9181 History of falling: Secondary | ICD-10-CM | POA: Diagnosis not present

## 2022-01-22 DIAGNOSIS — Z794 Long term (current) use of insulin: Secondary | ICD-10-CM | POA: Diagnosis not present

## 2022-01-22 DIAGNOSIS — Z86711 Personal history of pulmonary embolism: Secondary | ICD-10-CM | POA: Diagnosis not present

## 2022-01-22 DIAGNOSIS — K219 Gastro-esophageal reflux disease without esophagitis: Secondary | ICD-10-CM | POA: Diagnosis not present

## 2022-01-22 DIAGNOSIS — I2510 Atherosclerotic heart disease of native coronary artery without angina pectoris: Secondary | ICD-10-CM | POA: Diagnosis not present

## 2022-01-22 NOTE — Assessment & Plan Note (Signed)
Patient with recent titration of heart failure meds in the hospital, best lasix dose found to be 80 mg qd. Nurse concerned as she was unsure when to give diuretic or hold it based on patient's weight. Dry weight appears to be 115-118 lbs. It appears that patient was likely a little dry yesterday and possibly over-diuresed, which could account for fatigue. I discussed with patient and her family member with her and created HF treatment plan: ?- Green zone: if patient has no sx of dyspnea, no peripheral edema and her weight is under 115 lbs, OK to skip diuretic. If she has no symptoms and weight is above 115 lbs, give diuretic.  ?- Yellow zone: some symptoms such as peripheral edema or mild dyspnea and weight is 115-118 lbs or greater, give diuretic  ?- Red zone: peripheral edema present, dyspnea constant, weight >118 lbs and taking medication, call our office or go to ED. ?

## 2022-01-24 ENCOUNTER — Ambulatory Visit (HOSPITAL_COMMUNITY)
Admission: RE | Admit: 2022-01-24 | Discharge: 2022-01-24 | Disposition: A | Discharge status: Home / Self Care | Payer: Medicare Other | Source: Ambulatory Visit | Attending: Internal Medicine | Admitting: Internal Medicine

## 2022-01-24 DIAGNOSIS — I5022 Chronic systolic (congestive) heart failure: Secondary | ICD-10-CM | POA: Diagnosis not present

## 2022-01-24 DIAGNOSIS — I509 Heart failure, unspecified: Secondary | ICD-10-CM | POA: Diagnosis not present

## 2022-01-24 LAB — BASIC METABOLIC PANEL
Anion gap: 8 (ref 5–15)
BUN: 13 mg/dL (ref 8–23)
CO2: 27 mmol/L (ref 22–32)
Calcium: 9.1 mg/dL (ref 8.9–10.3)
Chloride: 104 mmol/L (ref 98–111)
Creatinine, Ser: 1.1 mg/dL — ABNORMAL HIGH (ref 0.44–1.00)
GFR, Estimated: 51 mL/min — ABNORMAL LOW (ref >60)
Glucose, Bld: 269 mg/dL — ABNORMAL HIGH (ref 70–99)
Potassium: 5 mmol/L (ref 3.5–5.1)
Sodium: 139 mmol/L (ref 135–145)

## 2022-01-24 LAB — BRAIN NATRIURETIC PEPTIDE: B Natriuretic Peptide: 1341.4 pg/mL — ABNORMAL HIGH (ref 0.0–100.0)

## 2022-01-28 ENCOUNTER — Other Ambulatory Visit (HOSPITAL_COMMUNITY): Payer: Self-pay

## 2022-01-28 ENCOUNTER — Other Ambulatory Visit (HOSPITAL_COMMUNITY): Payer: Self-pay | Admitting: *Deleted

## 2022-01-28 ENCOUNTER — Other Ambulatory Visit: Payer: Self-pay

## 2022-01-28 DIAGNOSIS — G4700 Insomnia, unspecified: Secondary | ICD-10-CM | POA: Diagnosis not present

## 2022-01-28 DIAGNOSIS — E43 Unspecified severe protein-calorie malnutrition: Secondary | ICD-10-CM | POA: Diagnosis not present

## 2022-01-28 DIAGNOSIS — Z7982 Long term (current) use of aspirin: Secondary | ICD-10-CM | POA: Diagnosis not present

## 2022-01-28 DIAGNOSIS — Z794 Long term (current) use of insulin: Secondary | ICD-10-CM | POA: Diagnosis not present

## 2022-01-28 DIAGNOSIS — M722 Plantar fascial fibromatosis: Secondary | ICD-10-CM | POA: Diagnosis not present

## 2022-01-28 DIAGNOSIS — E785 Hyperlipidemia, unspecified: Secondary | ICD-10-CM | POA: Diagnosis not present

## 2022-01-28 DIAGNOSIS — K219 Gastro-esophageal reflux disease without esophagitis: Secondary | ICD-10-CM | POA: Diagnosis not present

## 2022-01-28 DIAGNOSIS — Z86711 Personal history of pulmonary embolism: Secondary | ICD-10-CM | POA: Diagnosis not present

## 2022-01-28 DIAGNOSIS — D649 Anemia, unspecified: Secondary | ICD-10-CM | POA: Diagnosis not present

## 2022-01-28 DIAGNOSIS — Z9181 History of falling: Secondary | ICD-10-CM | POA: Diagnosis not present

## 2022-01-28 DIAGNOSIS — E1136 Type 2 diabetes mellitus with diabetic cataract: Secondary | ICD-10-CM | POA: Diagnosis not present

## 2022-01-28 DIAGNOSIS — I110 Hypertensive heart disease with heart failure: Secondary | ICD-10-CM | POA: Diagnosis not present

## 2022-01-28 DIAGNOSIS — I4891 Unspecified atrial fibrillation: Secondary | ICD-10-CM | POA: Diagnosis not present

## 2022-01-28 DIAGNOSIS — M8580 Other specified disorders of bone density and structure, unspecified site: Secondary | ICD-10-CM | POA: Diagnosis not present

## 2022-01-28 DIAGNOSIS — Z7901 Long term (current) use of anticoagulants: Secondary | ICD-10-CM | POA: Diagnosis not present

## 2022-01-28 DIAGNOSIS — I2510 Atherosclerotic heart disease of native coronary artery without angina pectoris: Secondary | ICD-10-CM | POA: Diagnosis not present

## 2022-01-28 DIAGNOSIS — I5020 Unspecified systolic (congestive) heart failure: Secondary | ICD-10-CM | POA: Diagnosis not present

## 2022-01-28 MED ORDER — METOPROLOL SUCCINATE ER 25 MG PO TB24: 25.0000 mg | ORAL_TABLET | Freq: Every day | ORAL | 6 refills | Status: DC

## 2022-01-28 MED ORDER — APIXABAN 5 MG PO TABS: 5.0000 mg | ORAL_TABLET | Freq: Two times a day (BID) | ORAL | 0 refills | Status: DC

## 2022-01-29 ENCOUNTER — Ambulatory Visit (HOSPITAL_COMMUNITY)
Admission: RE | Admit: 2022-01-29 | Discharge: 2022-01-29 | Disposition: A | Discharge status: Home / Self Care | Payer: Medicare Other | Source: Ambulatory Visit | Attending: Cardiology | Admitting: Cardiology

## 2022-01-29 ENCOUNTER — Encounter (HOSPITAL_COMMUNITY): Payer: Self-pay

## 2022-01-29 ENCOUNTER — Telehealth (HOSPITAL_COMMUNITY): Payer: Self-pay | Admitting: *Deleted

## 2022-01-29 VITALS — BP 136/92 | HR 67 | Ht 62.0 in | Wt 119.0 lb

## 2022-01-29 DIAGNOSIS — Z7901 Long term (current) use of anticoagulants: Secondary | ICD-10-CM | POA: Insufficient documentation

## 2022-01-29 DIAGNOSIS — Z853 Personal history of malignant neoplasm of breast: Secondary | ICD-10-CM | POA: Diagnosis not present

## 2022-01-29 DIAGNOSIS — I5082 Biventricular heart failure: Secondary | ICD-10-CM | POA: Insufficient documentation

## 2022-01-29 DIAGNOSIS — E785 Hyperlipidemia, unspecified: Secondary | ICD-10-CM | POA: Insufficient documentation

## 2022-01-29 DIAGNOSIS — Z79899 Other long term (current) drug therapy: Secondary | ICD-10-CM | POA: Diagnosis not present

## 2022-01-29 DIAGNOSIS — I252 Old myocardial infarction: Secondary | ICD-10-CM | POA: Diagnosis not present

## 2022-01-29 DIAGNOSIS — Z955 Presence of coronary angioplasty implant and graft: Secondary | ICD-10-CM | POA: Diagnosis not present

## 2022-01-29 DIAGNOSIS — Z923 Personal history of irradiation: Secondary | ICD-10-CM | POA: Insufficient documentation

## 2022-01-29 DIAGNOSIS — Z87440 Personal history of urinary (tract) infections: Secondary | ICD-10-CM | POA: Diagnosis not present

## 2022-01-29 DIAGNOSIS — I2510 Atherosclerotic heart disease of native coronary artery without angina pectoris: Secondary | ICD-10-CM | POA: Insufficient documentation

## 2022-01-29 DIAGNOSIS — E119 Type 2 diabetes mellitus without complications: Secondary | ICD-10-CM | POA: Insufficient documentation

## 2022-01-29 DIAGNOSIS — E43 Unspecified severe protein-calorie malnutrition: Secondary | ICD-10-CM | POA: Diagnosis not present

## 2022-01-29 DIAGNOSIS — Z794 Long term (current) use of insulin: Secondary | ICD-10-CM | POA: Insufficient documentation

## 2022-01-29 DIAGNOSIS — Z86711 Personal history of pulmonary embolism: Secondary | ICD-10-CM | POA: Insufficient documentation

## 2022-01-29 DIAGNOSIS — I4891 Unspecified atrial fibrillation: Secondary | ICD-10-CM | POA: Diagnosis not present

## 2022-01-29 DIAGNOSIS — M722 Plantar fascial fibromatosis: Secondary | ICD-10-CM | POA: Diagnosis not present

## 2022-01-29 DIAGNOSIS — I5020 Unspecified systolic (congestive) heart failure: Secondary | ICD-10-CM

## 2022-01-29 DIAGNOSIS — I480 Paroxysmal atrial fibrillation: Secondary | ICD-10-CM | POA: Diagnosis not present

## 2022-01-29 DIAGNOSIS — Z9011 Acquired absence of right breast and nipple: Secondary | ICD-10-CM | POA: Insufficient documentation

## 2022-01-29 DIAGNOSIS — E1136 Type 2 diabetes mellitus with diabetic cataract: Secondary | ICD-10-CM | POA: Diagnosis not present

## 2022-01-29 DIAGNOSIS — M8580 Other specified disorders of bone density and structure, unspecified site: Secondary | ICD-10-CM | POA: Diagnosis not present

## 2022-01-29 DIAGNOSIS — K219 Gastro-esophageal reflux disease without esophagitis: Secondary | ICD-10-CM | POA: Diagnosis not present

## 2022-01-29 DIAGNOSIS — I110 Hypertensive heart disease with heart failure: Secondary | ICD-10-CM | POA: Insufficient documentation

## 2022-01-29 DIAGNOSIS — D649 Anemia, unspecified: Secondary | ICD-10-CM | POA: Diagnosis not present

## 2022-01-29 DIAGNOSIS — Z7982 Long term (current) use of aspirin: Secondary | ICD-10-CM | POA: Diagnosis not present

## 2022-01-29 DIAGNOSIS — Z9181 History of falling: Secondary | ICD-10-CM | POA: Diagnosis not present

## 2022-01-29 DIAGNOSIS — G4700 Insomnia, unspecified: Secondary | ICD-10-CM | POA: Diagnosis not present

## 2022-01-29 LAB — COMPREHENSIVE METABOLIC PANEL
ALT: 17 U/L (ref 0–44)
AST: 25 U/L (ref 15–41)
Albumin: 3.5 g/dL (ref 3.5–5.0)
Alkaline Phosphatase: 67 U/L (ref 38–126)
Anion gap: 8 (ref 5–15)
BUN: 13 mg/dL (ref 8–23)
CO2: 26 mmol/L (ref 22–32)
Calcium: 9.1 mg/dL (ref 8.9–10.3)
Chloride: 104 mmol/L (ref 98–111)
Creatinine, Ser: 1.07 mg/dL — ABNORMAL HIGH (ref 0.44–1.00)
GFR, Estimated: 53 mL/min — ABNORMAL LOW (ref >60)
Glucose, Bld: 183 mg/dL — ABNORMAL HIGH (ref 70–99)
Potassium: 4 mmol/L (ref 3.5–5.1)
Sodium: 138 mmol/L (ref 135–145)
Total Bilirubin: 1 mg/dL (ref 0.3–1.2)
Total Protein: 6.8 g/dL (ref 6.5–8.1)

## 2022-01-29 LAB — BRAIN NATRIURETIC PEPTIDE: B Natriuretic Peptide: 1116.6 pg/mL — ABNORMAL HIGH (ref 0.0–100.0)

## 2022-01-29 MED ORDER — ISOSORBIDE MONONITRATE ER 30 MG PO TB24: 15.0000 mg | ORAL_TABLET | Freq: Every day | ORAL | 3 refills | Status: DC

## 2022-01-29 MED ORDER — HYDRALAZINE HCL 25 MG PO TABS
25.0000 mg | ORAL_TABLET | Freq: Three times a day (TID) | ORAL | 3 refills | Status: DC
Start: 2022-01-29 — End: 2022-03-20

## 2022-01-29 NOTE — Progress Notes (Signed)
? ? ?HEART & VASCULAR TRANSITION OF CARE PROGRESS NOTE  ? ? ? ?Referring Physician: Dr. Madison Hickman, Family Medicine  ?Primary Care:Cresenzo, Christy Sartorius, MD  ?Primary Cardiologist: Dr. Lyla Son  ? ?HPI: ?Referred to clinic by Dr. Madison Hickman for heart failure consultation.  ? ?81 y/o female w/ h/o CAD, s/p remote inferior wall MI in 2000 treated w/ PCI to RCA, hypertension, type 2DM, HLD and h/o right sided breast cancer treated w/ radiation and mastectomy, no chemotherapy.  ? ?Followed by Dr. Terrence Dupont ?Echo 2019 EF 55-60%, G1D, RV normal  ?NST 2019 c/w prior inferior myocardial infarction. Small mild distal anterior/apical infarct with mild peri-infarct ischemia>> Low risk study  ? ?She was extremely sick 04/2021 w/ nearly 6 wk hospitalization. She had laparoscopic cholecystectomy on 05/02/21, discharged home, developed abdominal pain associated with sudden onset of altered mental status noted to be hypotensive, bradycardic and hypoxic by EMS requiring intubation and subsequently was noted to have a right empyema and perforation of the esophagus requiring emergent thoracotomy w/ repair of esophageal perforation with intercostal pedicled muscle flap, drainage of empyema and decortication of right lung on 7/24.  ? ?7/25, she developed EKG changes w/ minimal inferior ST elevation and troponin leak. Subsequent 2-D echo showed moderately reduced LVEF, 40%, and severely reduced RV systolic function. However given recent surgery, she was not a candidate for intervention. ? ?8/8 developed increased O2 requirements, subsequently had CT angiogram which showed bilateral pulmonary embolism.  Repeat limited echo showed EF 25-30%, RV normal. No evidence of significant strain>>>IV heparin>>Eliquis.  ? ?Hospitalization also notable for atrial flutter. Dr. Terrence Dupont followed during hospitalization.  ? ?She recovered and had been doing fairly well. Lives at home alone, but sister and brother live down the street and help when needed. Ambulates w/  walker. Chronically NYHA Class II-early III. Depends on family for grocery shopping and picking up mail from [place].  ? ?Of note EKG 1/23 showed afib w/ CVR. F/u EKGs 3/23 showed NSR. ? ?Recent admit 3/23 for a/c CHF. Was in NSR during admission. Echo EF 25-30%, RV moderately reduced right ventricular systolic pressure 16.0 mmHg, mild MR. Diuresed 9 lb down to a dry wt of 119 lb. Discharged home on lasix, Entresto and Toprol XL. Referred to [facility] clinic.  ? ?Prior to her initial TOC visit, she had had post hospital f/u w/ PCP. There was concern for over diuresis and she was instructed to change lasix from daily to PRN if wt >119 lb. ? ?At [facility] f/u, she was still volume overloaded, BNP 2,244. She continued w/ exertional dyspnea. NYHA Class II-early III. SCr was ok. Entersto was further increased to 97-103 mg bid and lasix restarted. Instructed to take 80 mg daily x 2 days, then 40 mg daily thereafter.  ? ?She presents back for f/u. Here w/ her sister. Feeling better, NYHA Class II. Still w/ mild fluid overload, w/ predominant abdominal edema, though reports good urinary response to PO Lasix. Tolerating Entresto dose ok. No dizziness. BP 136/92. BNP trending down but remains elevated today at 1,116. SCr stable at 1.07. K 4.0. LFTs normal.  ? ? ? ?Cardiac Testing  ? ?2D Echo 12/2017 EF 55-60%, G1D, RV normal  ? ?NST 12/2017  ?There was no ST segment deviation noted during stress. ?T wave inversion was noted during stress in the III leads, beginning at 1 minutes of stress. T wave inversion persisted. ?Findings consistent with prior inferior myocardial infarction. Small mild distal anterior/apical infarct with mild peri-infarct ischemia. ?This is a low  risk study. Mild area of current ischemia. ?The left ventricular ejection fraction is normal (55-65%). ? ? ?Echo 04/2021 EF 45%, RV severely reduced  ?Limited Echo 05/2021 EF 25-30%, RV normal  ?Echo 12/2021 EF 25-30%, RV moderately reduced right ventricular systolic pressure is  38.9 mmHg, mild MR  ? ?Review of Systems: [y] = yes, '[ ]'$  = no  ? ?General: Weight gain '[ ]'$ ; Weight loss '[ ]'$ ; Anorexia '[ ]'$ ; Fatigue '[ ]'$ ; Fever '[ ]'$ ; Chills '[ ]'$ ; Weakness '[ ]'$   ?Cardiac: Chest pain/pressure '[ ]'$ ; Resting SOB '[ ]'$ ; Exertional SOB [Y ]; Orthopnea '[ ]'$ ; Pedal Edema '[ ]'$ ; Palpitations '[ ]'$ ; Syncope '[ ]'$ ; Presyncope '[ ]'$ ; Paroxysmal nocturnal dyspnea'[ ]'$   ?Pulmonary: Cough '[ ]'$ ; Wheezing'[ ]'$ ; Hemoptysis'[ ]'$ ; Sputum '[ ]'$ ; Snoring '[ ]'$   ?GI: Vomiting'[ ]'$ ; Dysphagia'[ ]'$ ; Melena'[ ]'$ ; Hematochezia '[ ]'$ ; Heartburn'[ ]'$ ; Abdominal pain '[ ]'$ ; Constipation '[ ]'$ ; Diarrhea '[ ]'$ ; BRBPR '[ ]'$   ?GU: Hematuria'[ ]'$ ; Dysuria '[ ]'$ ; Nocturia'[ ]'$   ?Vascular: Pain in legs with walking '[ ]'$ ; Pain in feet with lying flat '[ ]'$ ; Non-healing sores '[ ]'$ ; Stroke '[ ]'$ ; TIA '[ ]'$ ; Slurred speech '[ ]'$ ;  ?Neuro: Headaches'[ ]'$ ; Vertigo'[ ]'$ ; Seizures'[ ]'$ ; Paresthesias'[ ]'$ ;Blurred vision '[ ]'$ ; Diplopia '[ ]'$ ; Vision changes '[ ]'$   ?Ortho/Skin: Arthritis '[ ]'$ ; Joint pain '[ ]'$ ; Muscle pain '[ ]'$ ; Joint swelling '[ ]'$ ; Back Pain '[ ]'$ ; Rash '[ ]'$   ?Psych: Depression'[ ]'$ ; Anxiety'[ ]'$   ?Heme: Bleeding problems '[ ]'$ ; Clotting disorders '[ ]'$ ; Anemia '[ ]'$   ?Endocrine: Diabetes '[ ]'$ ; Thyroid dysfunction'[ ]'$  ? ? ?Past Medical History:  ?Diagnosis Date  ? Allergy   ? occ uses OTC allergy meds   ? Arthritis   ? fingers  ? Breast cancer ([place]) 04/2008  ? Right breast  ? Cataracts, bilateral   ? removed bilat   ? Cervical cancer ([place])   ? When the patient was in her 74s  ? Depression   ? Diabetes mellitus without complication ([place])   ? Heart attack [facility]) 2004  ? 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 Hyperlipidemia   ? Hypertension   ? Osteopenia 12/10/2006  ? On vitamin D per oncology Dr. Truddie Coco  ? PEG (percutaneous endoscopic gastrostomy) status ([place]) 07/23/2021  ? Personal history of radiation therapy 2009  ? Respiratory failure ([place]) 05/04/2021  ? ? ?Current Outpatient Medications  ?Medication Sig Dispense Refill  ? acetaminophen (TYLENOL) 500 MG tablet Take 500 mg by mouth every 6 (six) hours as needed for moderate pain.     ? amiodarone (PACERONE) 200 MG tablet Take 100 mg by mouth daily.    ? apixaban (ELIQUIS) 5 MG TABS tablet Take 1 tablet (5 mg total) by mouth 2 (two) times daily. 60 tablet 0  ? aspirin EC 81 MG tablet Take 81 mg by mouth in the morning. Swallow whole.    ? Blood Glucose Monitoring Suppl MISC daily.    ? furosemide (LASIX) 80 MG tablet Take 40 mg by mouth daily.    ? glucose blood (ONE TOUCH ULTRA TEST) test strip CHECK BLOOD SUGAR TWICE DAILY 100 each 1  ? insulin glargine (LANTUS SOLOSTAR) 100 UNIT/ML Solostar Pen Inject 6 Units into the skin in the morning.    ? Insulin Pen Needle (B-D UF III MINI PEN NEEDLES) 31G X 5 MM MISC INJECT INSULIN VIA PEN 6 TIMES DAILY 200 each 3  ? Melatonin 3 MG TBDP Take 6 mg by mouth at bedtime as  needed (sleep).    ? metoprolol succinate (TOPROL-XL) 25 MG 24 hr tablet Take 1 tablet (25 mg total) by mouth daily. 30 tablet 6  ? omeprazole (PRILOSEC) 20 MG capsule Take 40 mg by mouth every morning.    ? ondansetron (ZOFRAN) 4 MG tablet Take 1 tablet (4 mg total) by mouth daily as needed for nausea or vomiting. 30 tablet 1  ? OneTouch Delica Lancets 76E MISC 1 Container by Does not apply route as needed. 100 each PRN  ? rosuvastatin (CRESTOR) 20 MG tablet TAKE 1 TABLET BY MOUTH EVERYDAY AT BEDTIME 90 tablet 1  ? sacubitril-valsartan (ENTRESTO) 97-103 MG Take 1 tablet by mouth 2 (two) times daily. 60 tablet 11  ? traZODone (DESYREL) 50 MG tablet Take 50 mg by mouth at bedtime as needed for sleep.    ? ?No current facility-administered medications for this encounter.  ? ? ?No Known Allergies ? ?  ?Social History  ? ?Socioeconomic History  ? Marital status: Divorced  ?  Spouse name: Not on file  ? Number of children: 5  ? Years of education: 10  ? Highest education level: 10th grade  ?Occupational History  ? Occupation: Retired- IT trainer  ?Tobacco Use  ? Smoking status: Never  ? Smokeless tobacco: Never  ? Tobacco comments:  ?  No plans to start  ?Vaping Use  ? Vaping  Use: Never used  ?Substance and Sexual Activity  ? Alcohol use: No  ? Drug use: No  ? Sexual activity: Not Currently  ?  Birth control/protection: Condom  ?Other Topics Concern  ? Not on file  ?Social His

## 2022-01-29 NOTE — Telephone Encounter (Signed)
Called to confirm Heart & Vascular Transitions of Care appointment at 3 pm on 01/29/22. Patient reminded to bring all medications and pill box organizer with them. Confirmed patient has transportation. Gave directions, instructed to utilize [facility] parking. ? ?Confirmed appointment prior to ending call.   ? ?Earnestine Leys, BSN, RN ?Heart Failure Nurse Navigator ?Secure Chat Only  ?

## 2022-01-29 NOTE — Patient Instructions (Addendum)
START Hydralazine 25 mg, one tab three times per day ?START Isosorbide 15 mg, one tab daily ? ?Labs today ?We will only contact you if something comes back abnormal or we need to make some changes. ?Otherwise no news is good news! ? ?Your physician recommends that you schedule a follow-up appointment in: 3-4 weeks  in the Advanced Practitioners (PA/NP) Clinic  ? ? ? ? ?Do the following things EVERYDAY: ?Weigh yourself in the morning before breakfast. Write it down and keep it in a log. ?Take your medicines as prescribed ?Eat low salt foods--Limit salt (sodium) to 2000 mg per day.  ?Stay as active as you can everyday ?Limit all fluids for the day to less than 2 liters ? ? ?

## 2022-01-30 ENCOUNTER — Telehealth (HOSPITAL_COMMUNITY): Payer: Self-pay | Admitting: Cardiology

## 2022-01-30 MED ORDER — FUROSEMIDE 80 MG PO TABS: 40.0000 mg | ORAL_TABLET | Freq: Two times a day (BID) | ORAL | 3 refills | Status: DC

## 2022-01-30 NOTE — Telephone Encounter (Signed)
-----   Message from Consuelo Pandy, [name] sent at 01/29/2022  6:02 PM EDT ----- ?BNP (fluid marker) is still high. Increase Lasix to 40 mg bid. Continue w/ other meds as directed   ? ?Repeat BMP and BNP in 1 week  ?

## 2022-01-30 NOTE — Telephone Encounter (Signed)
Patient called.  Patient aware. ?Repeat labs 4/28  ?

## 2022-02-03 DIAGNOSIS — I4891 Unspecified atrial fibrillation: Secondary | ICD-10-CM | POA: Diagnosis not present

## 2022-02-03 DIAGNOSIS — D649 Anemia, unspecified: Secondary | ICD-10-CM | POA: Diagnosis not present

## 2022-02-03 DIAGNOSIS — Z7982 Long term (current) use of aspirin: Secondary | ICD-10-CM | POA: Diagnosis not present

## 2022-02-03 DIAGNOSIS — Z7901 Long term (current) use of anticoagulants: Secondary | ICD-10-CM | POA: Diagnosis not present

## 2022-02-03 DIAGNOSIS — E43 Unspecified severe protein-calorie malnutrition: Secondary | ICD-10-CM | POA: Diagnosis not present

## 2022-02-03 DIAGNOSIS — Z9181 History of falling: Secondary | ICD-10-CM | POA: Diagnosis not present

## 2022-02-03 DIAGNOSIS — Z794 Long term (current) use of insulin: Secondary | ICD-10-CM | POA: Diagnosis not present

## 2022-02-03 DIAGNOSIS — M8580 Other specified disorders of bone density and structure, unspecified site: Secondary | ICD-10-CM | POA: Diagnosis not present

## 2022-02-03 DIAGNOSIS — I2510 Atherosclerotic heart disease of native coronary artery without angina pectoris: Secondary | ICD-10-CM | POA: Diagnosis not present

## 2022-02-03 DIAGNOSIS — Z86711 Personal history of pulmonary embolism: Secondary | ICD-10-CM | POA: Diagnosis not present

## 2022-02-03 DIAGNOSIS — E785 Hyperlipidemia, unspecified: Secondary | ICD-10-CM | POA: Diagnosis not present

## 2022-02-03 DIAGNOSIS — G4700 Insomnia, unspecified: Secondary | ICD-10-CM | POA: Diagnosis not present

## 2022-02-03 DIAGNOSIS — E1136 Type 2 diabetes mellitus with diabetic cataract: Secondary | ICD-10-CM | POA: Diagnosis not present

## 2022-02-03 DIAGNOSIS — I110 Hypertensive heart disease with heart failure: Secondary | ICD-10-CM | POA: Diagnosis not present

## 2022-02-03 DIAGNOSIS — K219 Gastro-esophageal reflux disease without esophagitis: Secondary | ICD-10-CM | POA: Diagnosis not present

## 2022-02-03 DIAGNOSIS — M722 Plantar fascial fibromatosis: Secondary | ICD-10-CM | POA: Diagnosis not present

## 2022-02-03 DIAGNOSIS — I5020 Unspecified systolic (congestive) heart failure: Secondary | ICD-10-CM | POA: Diagnosis not present

## 2022-02-04 DIAGNOSIS — Z7901 Long term (current) use of anticoagulants: Secondary | ICD-10-CM | POA: Diagnosis not present

## 2022-02-04 DIAGNOSIS — I4891 Unspecified atrial fibrillation: Secondary | ICD-10-CM | POA: Diagnosis not present

## 2022-02-04 DIAGNOSIS — Z794 Long term (current) use of insulin: Secondary | ICD-10-CM | POA: Diagnosis not present

## 2022-02-04 DIAGNOSIS — E1136 Type 2 diabetes mellitus with diabetic cataract: Secondary | ICD-10-CM | POA: Diagnosis not present

## 2022-02-04 DIAGNOSIS — Z9181 History of falling: Secondary | ICD-10-CM | POA: Diagnosis not present

## 2022-02-04 DIAGNOSIS — E785 Hyperlipidemia, unspecified: Secondary | ICD-10-CM | POA: Diagnosis not present

## 2022-02-04 DIAGNOSIS — D649 Anemia, unspecified: Secondary | ICD-10-CM | POA: Diagnosis not present

## 2022-02-04 DIAGNOSIS — Z7982 Long term (current) use of aspirin: Secondary | ICD-10-CM | POA: Diagnosis not present

## 2022-02-04 DIAGNOSIS — M8580 Other specified disorders of bone density and structure, unspecified site: Secondary | ICD-10-CM | POA: Diagnosis not present

## 2022-02-04 DIAGNOSIS — G4700 Insomnia, unspecified: Secondary | ICD-10-CM | POA: Diagnosis not present

## 2022-02-04 DIAGNOSIS — E43 Unspecified severe protein-calorie malnutrition: Secondary | ICD-10-CM | POA: Diagnosis not present

## 2022-02-04 DIAGNOSIS — I2510 Atherosclerotic heart disease of native coronary artery without angina pectoris: Secondary | ICD-10-CM | POA: Diagnosis not present

## 2022-02-04 DIAGNOSIS — I110 Hypertensive heart disease with heart failure: Secondary | ICD-10-CM | POA: Diagnosis not present

## 2022-02-04 DIAGNOSIS — I5020 Unspecified systolic (congestive) heart failure: Secondary | ICD-10-CM | POA: Diagnosis not present

## 2022-02-04 DIAGNOSIS — K219 Gastro-esophageal reflux disease without esophagitis: Secondary | ICD-10-CM | POA: Diagnosis not present

## 2022-02-04 DIAGNOSIS — M722 Plantar fascial fibromatosis: Secondary | ICD-10-CM | POA: Diagnosis not present

## 2022-02-04 DIAGNOSIS — Z86711 Personal history of pulmonary embolism: Secondary | ICD-10-CM | POA: Diagnosis not present

## 2022-02-07 ENCOUNTER — Ambulatory Visit (HOSPITAL_COMMUNITY)
Admission: RE | Admit: 2022-02-07 | Discharge: 2022-02-07 | Disposition: A | Discharge status: Home / Self Care | Payer: Medicare Other | Source: Ambulatory Visit | Attending: Internal Medicine | Admitting: Internal Medicine

## 2022-02-07 DIAGNOSIS — I5020 Unspecified systolic (congestive) heart failure: Secondary | ICD-10-CM | POA: Insufficient documentation

## 2022-02-07 LAB — BASIC METABOLIC PANEL
Anion gap: 7 (ref 5–15)
BUN: 12 mg/dL (ref 8–23)
CO2: 29 mmol/L (ref 22–32)
Calcium: 9.2 mg/dL (ref 8.9–10.3)
Chloride: 104 mmol/L (ref 98–111)
Creatinine, Ser: 0.99 mg/dL (ref 0.44–1.00)
GFR, Estimated: 58 mL/min — ABNORMAL LOW (ref >60)
Glucose, Bld: 278 mg/dL — ABNORMAL HIGH (ref 70–99)
Potassium: 4.1 mmol/L (ref 3.5–5.1)
Sodium: 140 mmol/L (ref 135–145)

## 2022-02-07 LAB — BRAIN NATRIURETIC PEPTIDE: B Natriuretic Peptide: 1071.9 pg/mL — ABNORMAL HIGH (ref 0.0–100.0)

## 2022-02-09 DIAGNOSIS — I2510 Atherosclerotic heart disease of native coronary artery without angina pectoris: Secondary | ICD-10-CM | POA: Diagnosis not present

## 2022-02-11 DIAGNOSIS — D649 Anemia, unspecified: Secondary | ICD-10-CM | POA: Diagnosis not present

## 2022-02-11 DIAGNOSIS — E785 Hyperlipidemia, unspecified: Secondary | ICD-10-CM | POA: Diagnosis not present

## 2022-02-11 DIAGNOSIS — M8580 Other specified disorders of bone density and structure, unspecified site: Secondary | ICD-10-CM | POA: Diagnosis not present

## 2022-02-11 DIAGNOSIS — M722 Plantar fascial fibromatosis: Secondary | ICD-10-CM | POA: Diagnosis not present

## 2022-02-11 DIAGNOSIS — I4891 Unspecified atrial fibrillation: Secondary | ICD-10-CM | POA: Diagnosis not present

## 2022-02-11 DIAGNOSIS — Z794 Long term (current) use of insulin: Secondary | ICD-10-CM | POA: Diagnosis not present

## 2022-02-11 DIAGNOSIS — Z7982 Long term (current) use of aspirin: Secondary | ICD-10-CM | POA: Diagnosis not present

## 2022-02-11 DIAGNOSIS — E1136 Type 2 diabetes mellitus with diabetic cataract: Secondary | ICD-10-CM | POA: Diagnosis not present

## 2022-02-11 DIAGNOSIS — E43 Unspecified severe protein-calorie malnutrition: Secondary | ICD-10-CM | POA: Diagnosis not present

## 2022-02-11 DIAGNOSIS — K219 Gastro-esophageal reflux disease without esophagitis: Secondary | ICD-10-CM | POA: Diagnosis not present

## 2022-02-11 DIAGNOSIS — I110 Hypertensive heart disease with heart failure: Secondary | ICD-10-CM | POA: Diagnosis not present

## 2022-02-11 DIAGNOSIS — Z7901 Long term (current) use of anticoagulants: Secondary | ICD-10-CM | POA: Diagnosis not present

## 2022-02-11 DIAGNOSIS — Z86711 Personal history of pulmonary embolism: Secondary | ICD-10-CM | POA: Diagnosis not present

## 2022-02-11 DIAGNOSIS — I2510 Atherosclerotic heart disease of native coronary artery without angina pectoris: Secondary | ICD-10-CM | POA: Diagnosis not present

## 2022-02-11 DIAGNOSIS — Z9181 History of falling: Secondary | ICD-10-CM | POA: Diagnosis not present

## 2022-02-11 DIAGNOSIS — I5020 Unspecified systolic (congestive) heart failure: Secondary | ICD-10-CM | POA: Diagnosis not present

## 2022-02-11 DIAGNOSIS — G4700 Insomnia, unspecified: Secondary | ICD-10-CM | POA: Diagnosis not present

## 2022-02-14 ENCOUNTER — Ambulatory Visit: Payer: Medicare Other | Admitting: Family Medicine

## 2022-02-17 DIAGNOSIS — E1136 Type 2 diabetes mellitus with diabetic cataract: Secondary | ICD-10-CM | POA: Diagnosis not present

## 2022-02-17 DIAGNOSIS — Z9181 History of falling: Secondary | ICD-10-CM | POA: Diagnosis not present

## 2022-02-17 DIAGNOSIS — Z7901 Long term (current) use of anticoagulants: Secondary | ICD-10-CM | POA: Diagnosis not present

## 2022-02-17 DIAGNOSIS — I4891 Unspecified atrial fibrillation: Secondary | ICD-10-CM | POA: Diagnosis not present

## 2022-02-17 DIAGNOSIS — I110 Hypertensive heart disease with heart failure: Secondary | ICD-10-CM | POA: Diagnosis not present

## 2022-02-17 DIAGNOSIS — E785 Hyperlipidemia, unspecified: Secondary | ICD-10-CM | POA: Diagnosis not present

## 2022-02-17 DIAGNOSIS — I5020 Unspecified systolic (congestive) heart failure: Secondary | ICD-10-CM | POA: Diagnosis not present

## 2022-02-17 DIAGNOSIS — M722 Plantar fascial fibromatosis: Secondary | ICD-10-CM | POA: Diagnosis not present

## 2022-02-17 DIAGNOSIS — K219 Gastro-esophageal reflux disease without esophagitis: Secondary | ICD-10-CM | POA: Diagnosis not present

## 2022-02-17 DIAGNOSIS — G4700 Insomnia, unspecified: Secondary | ICD-10-CM | POA: Diagnosis not present

## 2022-02-17 DIAGNOSIS — I2510 Atherosclerotic heart disease of native coronary artery without angina pectoris: Secondary | ICD-10-CM | POA: Diagnosis not present

## 2022-02-17 DIAGNOSIS — Z86711 Personal history of pulmonary embolism: Secondary | ICD-10-CM | POA: Diagnosis not present

## 2022-02-17 DIAGNOSIS — M8580 Other specified disorders of bone density and structure, unspecified site: Secondary | ICD-10-CM | POA: Diagnosis not present

## 2022-02-17 DIAGNOSIS — D649 Anemia, unspecified: Secondary | ICD-10-CM | POA: Diagnosis not present

## 2022-02-17 DIAGNOSIS — Z7982 Long term (current) use of aspirin: Secondary | ICD-10-CM | POA: Diagnosis not present

## 2022-02-17 DIAGNOSIS — E43 Unspecified severe protein-calorie malnutrition: Secondary | ICD-10-CM | POA: Diagnosis not present

## 2022-02-17 DIAGNOSIS — Z794 Long term (current) use of insulin: Secondary | ICD-10-CM | POA: Diagnosis not present

## 2022-02-21 DIAGNOSIS — I10 Essential (primary) hypertension: Secondary | ICD-10-CM | POA: Diagnosis not present

## 2022-02-21 DIAGNOSIS — I480 Paroxysmal atrial fibrillation: Secondary | ICD-10-CM | POA: Diagnosis not present

## 2022-02-21 DIAGNOSIS — E119 Type 2 diabetes mellitus without complications: Secondary | ICD-10-CM | POA: Diagnosis not present

## 2022-02-21 DIAGNOSIS — I2699 Other pulmonary embolism without acute cor pulmonale: Secondary | ICD-10-CM | POA: Diagnosis not present

## 2022-02-21 DIAGNOSIS — I5020 Unspecified systolic (congestive) heart failure: Secondary | ICD-10-CM | POA: Diagnosis not present

## 2022-02-21 DIAGNOSIS — E785 Hyperlipidemia, unspecified: Secondary | ICD-10-CM | POA: Diagnosis not present

## 2022-02-21 DIAGNOSIS — I2510 Atherosclerotic heart disease of native coronary artery without angina pectoris: Secondary | ICD-10-CM | POA: Diagnosis not present

## 2022-02-23 DIAGNOSIS — I509 Heart failure, unspecified: Secondary | ICD-10-CM | POA: Diagnosis not present

## 2022-02-25 ENCOUNTER — Telehealth (HOSPITAL_COMMUNITY): Payer: Self-pay | Admitting: *Deleted

## 2022-02-25 ENCOUNTER — Other Ambulatory Visit: Payer: Self-pay

## 2022-02-25 DIAGNOSIS — E43 Unspecified severe protein-calorie malnutrition: Secondary | ICD-10-CM | POA: Diagnosis not present

## 2022-02-25 DIAGNOSIS — Z7982 Long term (current) use of aspirin: Secondary | ICD-10-CM | POA: Diagnosis not present

## 2022-02-25 DIAGNOSIS — Z794 Long term (current) use of insulin: Secondary | ICD-10-CM | POA: Diagnosis not present

## 2022-02-25 DIAGNOSIS — I5020 Unspecified systolic (congestive) heart failure: Secondary | ICD-10-CM | POA: Diagnosis not present

## 2022-02-25 DIAGNOSIS — M722 Plantar fascial fibromatosis: Secondary | ICD-10-CM | POA: Diagnosis not present

## 2022-02-25 DIAGNOSIS — Z86711 Personal history of pulmonary embolism: Secondary | ICD-10-CM | POA: Diagnosis not present

## 2022-02-25 DIAGNOSIS — M8580 Other specified disorders of bone density and structure, unspecified site: Secondary | ICD-10-CM | POA: Diagnosis not present

## 2022-02-25 DIAGNOSIS — D649 Anemia, unspecified: Secondary | ICD-10-CM | POA: Diagnosis not present

## 2022-02-25 DIAGNOSIS — I2510 Atherosclerotic heart disease of native coronary artery without angina pectoris: Secondary | ICD-10-CM | POA: Diagnosis not present

## 2022-02-25 DIAGNOSIS — I110 Hypertensive heart disease with heart failure: Secondary | ICD-10-CM | POA: Diagnosis not present

## 2022-02-25 DIAGNOSIS — K219 Gastro-esophageal reflux disease without esophagitis: Secondary | ICD-10-CM | POA: Diagnosis not present

## 2022-02-25 DIAGNOSIS — Z7901 Long term (current) use of anticoagulants: Secondary | ICD-10-CM | POA: Diagnosis not present

## 2022-02-25 DIAGNOSIS — I4891 Unspecified atrial fibrillation: Secondary | ICD-10-CM | POA: Diagnosis not present

## 2022-02-25 DIAGNOSIS — Z9181 History of falling: Secondary | ICD-10-CM | POA: Diagnosis not present

## 2022-02-25 DIAGNOSIS — E1136 Type 2 diabetes mellitus with diabetic cataract: Secondary | ICD-10-CM | POA: Diagnosis not present

## 2022-02-25 DIAGNOSIS — G4700 Insomnia, unspecified: Secondary | ICD-10-CM | POA: Diagnosis not present

## 2022-02-25 DIAGNOSIS — E785 Hyperlipidemia, unspecified: Secondary | ICD-10-CM | POA: Diagnosis not present

## 2022-02-25 MED ORDER — APIXABAN 5 MG PO TABS: 5.0000 mg | ORAL_TABLET | Freq: Two times a day (BID) | ORAL | 0 refills | Status: DC

## 2022-02-25 NOTE — Telephone Encounter (Signed)
Pts home  health RN Amy called to report pts heart rate was only 52. Pt took her meds about 63mn before Amy took vitals. Amy asked if we wanted to decrease her metoprolol. Pt c/o feeling more fatigued and sluggish.  ? ?[facility] ?

## 2022-02-25 NOTE — Telephone Encounter (Signed)
Amy aware.

## 2022-02-26 ENCOUNTER — Ambulatory Visit (HOSPITAL_COMMUNITY)
Admission: RE | Admit: 2022-02-26 | Discharge: 2022-02-26 | Disposition: A | Discharge status: Home / Self Care | Payer: Medicare Other | Source: Ambulatory Visit | Attending: Family Medicine | Admitting: Family Medicine

## 2022-02-26 ENCOUNTER — Encounter (HOSPITAL_COMMUNITY): Payer: Self-pay

## 2022-02-26 VITALS — BP 142/72 | HR 62 | Wt 117.6 lb

## 2022-02-26 DIAGNOSIS — E119 Type 2 diabetes mellitus without complications: Secondary | ICD-10-CM

## 2022-02-26 DIAGNOSIS — I5082 Biventricular heart failure: Secondary | ICD-10-CM | POA: Insufficient documentation

## 2022-02-26 DIAGNOSIS — E785 Hyperlipidemia, unspecified: Secondary | ICD-10-CM | POA: Diagnosis not present

## 2022-02-26 DIAGNOSIS — Z86711 Personal history of pulmonary embolism: Secondary | ICD-10-CM | POA: Diagnosis not present

## 2022-02-26 DIAGNOSIS — I252 Old myocardial infarction: Secondary | ICD-10-CM | POA: Diagnosis not present

## 2022-02-26 DIAGNOSIS — I2510 Atherosclerotic heart disease of native coronary artery without angina pectoris: Secondary | ICD-10-CM | POA: Diagnosis not present

## 2022-02-26 DIAGNOSIS — I110 Hypertensive heart disease with heart failure: Secondary | ICD-10-CM | POA: Diagnosis not present

## 2022-02-26 DIAGNOSIS — E1136 Type 2 diabetes mellitus with diabetic cataract: Secondary | ICD-10-CM | POA: Diagnosis not present

## 2022-02-26 DIAGNOSIS — R0602 Shortness of breath: Secondary | ICD-10-CM | POA: Diagnosis not present

## 2022-02-26 DIAGNOSIS — Z7982 Long term (current) use of aspirin: Secondary | ICD-10-CM | POA: Insufficient documentation

## 2022-02-26 DIAGNOSIS — Z955 Presence of coronary angioplasty implant and graft: Secondary | ICD-10-CM | POA: Diagnosis not present

## 2022-02-26 DIAGNOSIS — Z794 Long term (current) use of insulin: Secondary | ICD-10-CM

## 2022-02-26 DIAGNOSIS — I10 Essential (primary) hypertension: Secondary | ICD-10-CM | POA: Diagnosis not present

## 2022-02-26 DIAGNOSIS — I480 Paroxysmal atrial fibrillation: Secondary | ICD-10-CM

## 2022-02-26 DIAGNOSIS — I5022 Chronic systolic (congestive) heart failure: Secondary | ICD-10-CM | POA: Diagnosis not present

## 2022-02-26 DIAGNOSIS — Z7901 Long term (current) use of anticoagulants: Secondary | ICD-10-CM | POA: Diagnosis not present

## 2022-02-26 DIAGNOSIS — Z79899 Other long term (current) drug therapy: Secondary | ICD-10-CM | POA: Insufficient documentation

## 2022-02-26 DIAGNOSIS — Z87440 Personal history of urinary (tract) infections: Secondary | ICD-10-CM | POA: Insufficient documentation

## 2022-02-26 LAB — BASIC METABOLIC PANEL
Anion gap: 8 (ref 5–15)
BUN: 18 mg/dL (ref 8–23)
CO2: 27 mmol/L (ref 22–32)
Calcium: 9.4 mg/dL (ref 8.9–10.3)
Chloride: 104 mmol/L (ref 98–111)
Creatinine, Ser: 1.21 mg/dL — ABNORMAL HIGH (ref 0.44–1.00)
GFR, Estimated: 46 mL/min — ABNORMAL LOW (ref >60)
Glucose, Bld: 178 mg/dL — ABNORMAL HIGH (ref 70–99)
Potassium: 4.7 mmol/L (ref 3.5–5.1)
Sodium: 139 mmol/L (ref 135–145)

## 2022-02-26 LAB — BRAIN NATRIURETIC PEPTIDE: B Natriuretic Peptide: 324.6 pg/mL — ABNORMAL HIGH (ref 0.0–100.0)

## 2022-02-26 MED ORDER — METOPROLOL SUCCINATE ER 25 MG PO TB24: 12.5000 mg | ORAL_TABLET | Freq: Every day | ORAL | 6 refills | Status: DC

## 2022-02-26 MED ORDER — SPIRONOLACTONE 25 MG PO TABS: 12.5000 mg | ORAL_TABLET | Freq: Every day | ORAL | 1 refills | Status: DC

## 2022-02-26 NOTE — Progress Notes (Signed)
? ?ADVANCED HF CLINIC CONSULT NOTE ? ?Referring Physician: Referred from [place] ?Primary Care: Gifford Shave, MD ?Primary Cardiologist: Dr. Terrence Dupont ?HF Cardiologist: Dr. Haroldine Laws ? ?HPI: ?Dana Coleman is a 81 y.o. female w/ h/o CAD, s/p remote inferior wall MI in 2000 treated w/ PCI to RCA, hypertension, type 2DM, HLD and h/o right sided breast cancer treated w/ radiation and mastectomy, no chemotherapy.  ?  ?Followed by Dr. Terrence Dupont ?Echo 2019 EF 55-60%, G1D, RV normal  ?NST 2019 c/w prior inferior myocardial infarction. Small mild distal anterior/apical infarct with mild peri-infarct ischemia>> Low risk study  ?  ?She was extremely sick 04/2021 w/ nearly 6 wk hospitalization. She had laparoscopic cholecystectomy on 05/02/21, discharged home, developed abdominal pain associated with sudden onset of altered mental status noted to be hypotensive, bradycardic and hypoxic by EMS requiring intubation and subsequently was noted to have a right empyema and perforation of the esophagus requiring emergent thoracotomy w/ repair of esophageal perforation with intercostal pedicled muscle flap, drainage of empyema and decortication of right lung on 7/24.  ?  ?7/25, she developed EKG changes w/ minimal inferior ST elevation and troponin leak. Echo showed moderately reduced LVEF, 40%, and severely reduced RV systolic function. However given recent surgery, she was not a candidate for intervention. She later developed increased O2 requirements, subsequently had CT angiogram which showed bilateral pulmonary embolism.  Repeat limited echo showed EF 25-30%, RV normal. No evidence of significant strain>>>IV heparin>>Eliquis. Hospitalization also notable for atrial flutter. Dr. Terrence Dupont followed during hospitalization.  ?  ?Of note EKG 1/23 showed afib w/ CVR. F/u EKGs 3/23 showed NSR. ?  ?Admit 3/23 for a/c CHF. Remained in NSR during admission. Echo EF 25-30%, RV moderately reduced right ventricular systolic pressure 82.9 mmHg,  mild MR. Diuresed 9 lb down to a dry wt of 119 lb. Discharged home on lasix, Entresto and Toprol XL. Referred to [facility] clinic. Seen in [facility] 4/23, remained volume overloaded, Entresto increased to goal dose and Lasix started.  ? ?Saw Dr. Terrence Dupont last week and Lasix reduced to 40 mg daily. ? ?Today she presents from [facility] to establish with AHF clinic. Overall feeling fatigued. She is SOB walking to the mailbox, ambulates with walker. Denies abnormal bleeding, palpitations, CP, dizziness, edema, or PND/Orthopnea. Appetite ok. No fever or chills. Weight at home 114 pounds. Taking all medications. She lives alone but siblings live down the street and help out. ? ?Cardiac Testing  ?  ?- Echo (3/19): EF 55-60%, G1D, RV normal  ?  ?- NST 12/2017  ?There was no ST segment deviation noted during stress. ?T wave inversion was noted during stress in the III leads, beginning at 1 minutes of stress. T wave inversion persisted. ?Findings consistent with prior inferior myocardial infarction. Small mild distal anterior/apical infarct with mild peri-infarct ischemia. ?This is a low risk study. Mild area of current ischemia. ?The left ventricular ejection fraction is normal (55-65%). ?  ?  ?- Echo (7/22): EF 45%, RV severely reduced  ? ?- Ltd Echo (8/22): EF 25-30%, RV normal  ? ?- Echo (3/23): EF 25-30%, RV moderately reduced right ventricular systolic pressure is 93.7 mmHg, mild MR  ?  ?Review of Systems: [y] = yes, '[ ]'$  = no  ? ?General: Weight gain '[ ]'$ ; Weight loss '[ ]'$ ; Anorexia '[ ]'$ ; Fatigue Blue.Reese ]; Fever '[ ]'$ ; Chills '[ ]'$ ; Weakness '[ ]'$   ?Cardiac: Chest pain/pressure '[ ]'$ ; Resting SOB '[ ]'$ ; Exertional SOB [ y]; Orthopnea '[ ]'$ ; Pedal Edema '[ ]'$ ;  Palpitations '[ ]'$ ; Syncope '[ ]'$ ; Presyncope '[ ]'$ ; Paroxysmal nocturnal dyspnea'[ ]'$   ?Pulmonary: Cough '[ ]'$ ; Wheezing'[ ]'$ ; Hemoptysis'[ ]'$ ; Sputum '[ ]'$ ; Snoring '[ ]'$   ?GI: Vomiting'[ ]'$ ; Dysphagia'[ ]'$ ; Melena'[ ]'$ ; Hematochezia '[ ]'$ ; Heartburn'[ ]'$ ; Abdominal pain '[ ]'$ ; Constipation '[ ]'$ ; Diarrhea '[ ]'$ ; BRBPR '[ ]'$   ?GU:  Hematuria'[ ]'$ ; Dysuria '[ ]'$ ; Nocturia'[ ]'$   ?Vascular: Pain in legs with walking '[ ]'$ ; Pain in feet with lying flat '[ ]'$ ; Non-healing sores '[ ]'$ ; Stroke '[ ]'$ ; TIA '[ ]'$ ; Slurred speech '[ ]'$ ;  ?Neuro: Headaches'[ ]'$ ; Vertigo'[ ]'$ ; Seizures'[ ]'$ ; Paresthesias'[ ]'$ ;Blurred vision '[ ]'$ ; Diplopia '[ ]'$ ; Vision changes '[ ]'$   ?Ortho/Skin: Arthritis '[ ]'$ ; Joint pain '[ ]'$ ; Muscle pain '[ ]'$ ; Joint swelling '[ ]'$ ; Back Pain '[ ]'$ ; Rash '[ ]'$   ?Psych: Depression'[ ]'$ ; Anxiety'[ ]'$   ?Heme: Bleeding problems '[ ]'$ ; Clotting disorders '[ ]'$ ; Anemia '[ ]'$   ?Endocrine: Diabetes Blue.Reese ]; Thyroid dysfunction'[ ]'$  ? ?Past Medical History:  ?Diagnosis Date  ? Allergy   ? occ uses OTC allergy meds   ? Arthritis   ? fingers  ? Breast cancer ([place]) 04/2008  ? Right breast  ? Cataracts, bilateral   ? removed bilat   ? Cervical cancer ([place])   ? When the patient was in her 64s  ? Depression   ? Diabetes mellitus without complication ([place])   ? Heart attack [facility]) 2004  ? 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 Hyperlipidemia   ? Hypertension   ? Osteopenia 12/10/2006  ? On vitamin D per oncology Dr. Truddie Coco  ? PEG (percutaneous endoscopic gastrostomy) status ([place]) 07/23/2021  ? Personal history of radiation therapy 2009  ? Respiratory failure ([place]) 05/04/2021  ? ? ?Current Outpatient Medications  ?Medication Sig Dispense Refill  ? acetaminophen (TYLENOL) 500 MG tablet Take 500 mg by mouth every 6 (six) hours as needed for moderate pain.    ? amiodarone (PACERONE) 200 MG tablet Take 100 mg by mouth daily.    ? apixaban (ELIQUIS) 5 MG TABS tablet Take 1 tablet (5 mg total) by mouth 2 (two) times daily. 60 tablet 0  ? aspirin EC 81 MG tablet Take 81 mg by mouth in the morning. Swallow whole.    ? Blood Glucose Monitoring Suppl MISC daily.    ? furosemide (LASIX) 80 MG tablet Take 0.5 tablets (40 mg total) by mouth 2 (two) times daily. 30 tablet 3  ? glucose blood (ONE TOUCH ULTRA TEST) test strip CHECK BLOOD SUGAR TWICE DAILY 100 each 1  ? hydrALAZINE (APRESOLINE) 25 MG tablet Take 1 tablet (25 mg total) by  mouth 3 (three) times daily. 90 tablet 3  ? insulin glargine (LANTUS SOLOSTAR) 100 UNIT/ML Solostar Pen Inject 6 Units into the skin in the morning.    ? Insulin Pen Needle (B-D UF III MINI PEN NEEDLES) 31G X 5 MM MISC INJECT INSULIN VIA PEN 6 TIMES DAILY 200 each 3  ? isosorbide mononitrate (IMDUR) 30 MG 24 hr tablet Take 0.5 tablets (15 mg total) by mouth daily. 15 tablet 3  ? Melatonin 3 MG TBDP Take 6 mg by mouth at bedtime as needed (sleep).    ? metoprolol succinate (TOPROL-XL) 25 MG 24 hr tablet Take 0.5 tablets (12.5 mg total) by mouth daily. 30 tablet 6  ? omeprazole (PRILOSEC) 20 MG capsule Take 40 mg by mouth every morning.    ? ondansetron (ZOFRAN) 4 MG tablet Take 1 tablet (4 mg total) by mouth daily  as needed for nausea or vomiting. 30 tablet 1  ? OneTouch Delica Lancets 29M MISC 1 Container by Does not apply route as needed. 100 each PRN  ? rosuvastatin (CRESTOR) 20 MG tablet TAKE 1 TABLET BY MOUTH EVERYDAY AT BEDTIME 90 tablet 1  ? sacubitril-valsartan (ENTRESTO) 97-103 MG Take 1 tablet by mouth 2 (two) times daily. 60 tablet 11  ? traZODone (DESYREL) 50 MG tablet Take 50 mg by mouth at bedtime as needed for sleep.    ? ?No current facility-administered medications for this encounter.  ? ?No Known Allergies ? ?Social History  ? ?Socioeconomic History  ? Marital status: Divorced  ?  Spouse name: Not on file  ? Number of children: 5  ? Years of education: 10  ? Highest education level: 10th grade  ?Occupational History  ? Occupation: Retired- IT trainer  ?Tobacco Use  ? Smoking status: Never  ? Smokeless tobacco: Never  ? Tobacco comments:  ?  No plans to start  ?Vaping Use  ? Vaping Use: Never used  ?Substance and Sexual Activity  ? Alcohol use: No  ? Drug use: No  ? Sexual activity: Not Currently  ?  Birth control/protection: Condom  ?Other Topics Concern  ? Not on file  ?Social History Narrative  ? Patient lives alone in [place].  ? Patient is close with daughters Solmon Ice and [place].  One Son in [place]. 2 children have passed.   ? Patient depends on her sister and brother for support and any assistance needed.   ? [name] (sister) [phone].  ? Religious / Personal Beliefs: Baptis

## 2022-02-26 NOTE — Patient Instructions (Signed)
Thank you for coming in today ? ?Labs were done today, if any labs are abnormal the clinic will call you ?No news is good news ? ?START Spironolactone 12.5 mg 1/2 tablet daily  ? ?STOP Amiodarone  ? ?Your physician recommends that you return for lab work in:  ?1 week  ? ?Your physician recommends that you schedule a follow-up appointment in:  ?2-3 with Dr. Haroldine Laws with echocardiogram ?3 weeks with Pharmacy ? ?Your physician has requested that you have an echocardiogram. Echocardiography is a painless test that uses sound waves to create images of your heart. It provides your doctor with information about the size and shape of your heart and how well your heart?s chambers and valves are working. This procedure takes approximately one hour. There are no restrictions for this procedure. ?  ?At the [facility], you and your health needs are our priority. As part of our continuing mission to provide you with exceptional heart care, we have created designated Provider Care Teams. These Care Teams include your primary Cardiologist (physician) and Advanced Practice Providers (APPs- Physician Assistants and Nurse Practitioners) who all work together to provide you with the care you need, when you need it.  ? ?You may see any of the following providers on your designated Care Team at your next follow up: ?Dr Glori Bickers ?Dr Loralie Champagne ?Darrick Grinder, NP ?Lyda Jester, PA ?Jessica Milford,NP ?Marlyce Huge, PA ?Audry Riles, PharmD ? ? ?Please be sure to bring in all your medications bottles to every appointment.  ? ?If you have any questions or concerns before your next appointment please send [place] a message through [place] or call our office at [phone].   ? ?TO LEAVE A MESSAGE FOR THE NURSE SELECT OPTION 2, PLEASE LEAVE A MESSAGE INCLUDING: ?YOUR NAME ?DATE OF BIRTH ?CALL BACK NUMBER ?REASON FOR CALL**this is important as we prioritize the call backs ? ?YOU WILL RECEIVE A CALL BACK THE SAME DAY  AS LONG AS YOU CALL BEFORE 4:00 PM ? ?Marland Kitchen ?

## 2022-03-04 DIAGNOSIS — Z7982 Long term (current) use of aspirin: Secondary | ICD-10-CM | POA: Diagnosis not present

## 2022-03-04 DIAGNOSIS — E1136 Type 2 diabetes mellitus with diabetic cataract: Secondary | ICD-10-CM | POA: Diagnosis not present

## 2022-03-04 DIAGNOSIS — D649 Anemia, unspecified: Secondary | ICD-10-CM | POA: Diagnosis not present

## 2022-03-04 DIAGNOSIS — I4891 Unspecified atrial fibrillation: Secondary | ICD-10-CM | POA: Diagnosis not present

## 2022-03-04 DIAGNOSIS — G4700 Insomnia, unspecified: Secondary | ICD-10-CM | POA: Diagnosis not present

## 2022-03-04 DIAGNOSIS — E785 Hyperlipidemia, unspecified: Secondary | ICD-10-CM | POA: Diagnosis not present

## 2022-03-04 DIAGNOSIS — Z9181 History of falling: Secondary | ICD-10-CM | POA: Diagnosis not present

## 2022-03-04 DIAGNOSIS — Z86711 Personal history of pulmonary embolism: Secondary | ICD-10-CM | POA: Diagnosis not present

## 2022-03-04 DIAGNOSIS — M8580 Other specified disorders of bone density and structure, unspecified site: Secondary | ICD-10-CM | POA: Diagnosis not present

## 2022-03-04 DIAGNOSIS — Z7901 Long term (current) use of anticoagulants: Secondary | ICD-10-CM | POA: Diagnosis not present

## 2022-03-04 DIAGNOSIS — I5020 Unspecified systolic (congestive) heart failure: Secondary | ICD-10-CM | POA: Diagnosis not present

## 2022-03-04 DIAGNOSIS — K219 Gastro-esophageal reflux disease without esophagitis: Secondary | ICD-10-CM | POA: Diagnosis not present

## 2022-03-04 DIAGNOSIS — M722 Plantar fascial fibromatosis: Secondary | ICD-10-CM | POA: Diagnosis not present

## 2022-03-04 DIAGNOSIS — E43 Unspecified severe protein-calorie malnutrition: Secondary | ICD-10-CM | POA: Diagnosis not present

## 2022-03-04 DIAGNOSIS — Z794 Long term (current) use of insulin: Secondary | ICD-10-CM | POA: Diagnosis not present

## 2022-03-04 DIAGNOSIS — I2510 Atherosclerotic heart disease of native coronary artery without angina pectoris: Secondary | ICD-10-CM | POA: Diagnosis not present

## 2022-03-04 DIAGNOSIS — I110 Hypertensive heart disease with heart failure: Secondary | ICD-10-CM | POA: Diagnosis not present

## 2022-03-05 ENCOUNTER — Ambulatory Visit (HOSPITAL_COMMUNITY)
Admission: RE | Admit: 2022-03-05 | Discharge: 2022-03-05 | Disposition: A | Discharge status: Home / Self Care | Payer: Medicare Other | Source: Ambulatory Visit | Attending: Cardiology | Admitting: Cardiology

## 2022-03-05 DIAGNOSIS — I5022 Chronic systolic (congestive) heart failure: Secondary | ICD-10-CM | POA: Insufficient documentation

## 2022-03-05 LAB — BASIC METABOLIC PANEL
Anion gap: 8 (ref 5–15)
BUN: 20 mg/dL (ref 8–23)
CO2: 24 mmol/L (ref 22–32)
Calcium: 9.4 mg/dL (ref 8.9–10.3)
Chloride: 106 mmol/L (ref 98–111)
Creatinine, Ser: 1.12 mg/dL — ABNORMAL HIGH (ref 0.44–1.00)
GFR, Estimated: 50 mL/min — ABNORMAL LOW (ref >60)
Glucose, Bld: 281 mg/dL — ABNORMAL HIGH (ref 70–99)
Potassium: 4.7 mmol/L (ref 3.5–5.1)
Sodium: 138 mmol/L (ref 135–145)

## 2022-03-05 NOTE — Progress Notes (Signed)
Advanced Heart Failure Clinic Note   Referring Physician: Referred from [facility] Primary Care: Gifford Shave, MD Primary Cardiologist: Dr. Terrence Dupont HF Cardiologist: Dr. Haroldine Laws  HPI:  Dana Coleman is a 81 y.o. female w/ h/o CAD, s/p remote inferior wall MI in 2000 treated w/ PCI to RCA, hypertension, type 2DM, HLD and h/o right sided breast cancer treated w/ radiation and mastectomy, no chemotherapy.    Followed by Dr. Terrence Dupont Echo 2019 EF 55-60%, G1D, RV normal  NST 2019 c/w prior inferior myocardial infarction. Small mild distal anterior/apical infarct with mild peri-infarct ischemia>> Low risk study    She was extremely sick 04/2021 w/ nearly 6 wk hospitalization. She had laparoscopic cholecystectomy on 05/02/21, discharged home, developed abdominal pain associated with sudden onset of altered mental status noted to be hypotensive, bradycardic and hypoxic by EMS requiring intubation and subsequently was noted to have a right empyema and perforation of the esophagus requiring emergent thoracotomy w/ repair of esophageal perforation with intercostal pedicled muscle flap, drainage of empyema and decortication of right lung on 05/05/21.    05/06/21, she developed EKG changes w/ minimal inferior ST elevation and troponin leak. Echo showed moderately reduced LVEF, 40%, and severely reduced RV systolic function. However given recent surgery, she was not a candidate for intervention. She later developed increased O2 requirements, subsequently had CT angiogram which showed bilateral pulmonary embolism.  Repeat limited echo showed EF 25-30%, RV normal. No evidence of significant strain>>>IV heparin>>Eliquis. Hospitalization also notable for atrial flutter. Dr. Terrence Dupont followed during hospitalization.    Of note EKG 10/2021 showed afib w/ CVR. F/u EKGs 12/2021 showed NSR.   Admit 12/2021 for a/c CHF. Remained in NSR during admission. Echo EF 25-30%, RV moderately reduced right ventricular systolic  pressure 35.3 mmHg, mild MR. Diuresed 9 lbs down to a dry wt of 119 lbs. Discharged home on Lasix, Entresto and metoprolol XL. Referred to [facility] clinic. Seen in [facility] 01/2022, remained volume overloaded, Entresto increased to goal dose and Lasix started.      Presented to AHF Clinic 02/26/22 for follow up. Was referred from [facility]. Overall was feeling fatigued. She reported getting SOB walking to the mailbox, ambulates with walker. Denied abnormal bleeding, palpitations, CP, dizziness, edema, or PND/Orthopnea. Appetite was ok. No fever or chills. Weight at home was 114 pounds. Reported taking all medications. She lives alone but siblings live down the street and help out.  Today she returns to HF clinic for pharmacist medication titration with her daughter. At last visit with APP, spironolactone 12.5 mg daily was initiated. Amiodarone was discontinued. Overall she is feeling well today. Some dizziness when she first wakes up in the morning. No CP or palpitations. SOB/DOE has improved but she still gets short of breath when walking short distances (had to stop when walking from valet parking to the clinic doors). Weight at home has been stable. She takes Lasix 40 mg daily. Has not had to use extra Lasix in 2-3 weeks. No LEE, PND or orthopnea. Sleeps on 2 pillows. SBP at home typically 140-150. BP in clinic 148/66. She used to have a home health nurse but this was her last week. She would benefit from HF paramedicine as she gets confused with her medications easily.     HF Medications: Metoprolol succinate 12.5 mg daily Entresto 97/103 mg BID Spironolactone 12.5 mg daily Hydralazine 25 mg TID Imdur 15 mg daily Lasix 40 mg daily  Has the patient been experiencing any side effects to the  medications prescribed?  no  Does the patient have any problems obtaining medications due to transportation or finances?   No [facility] Medicare/Medicaid  Understanding of regimen: good Understanding of indications:  good Potential of compliance: good Patient understands to avoid NSAIDs. Patient understands to avoid decongestants.    Pertinent Lab Values: 03/05/22: Serum creatinine 1.12, BUN 20, Potassium 4.7, Sodium 138  Vital Signs: Weight: 118 lbs (last clinic weight: 117.6 lbs) Blood pressure: 148/66  Heart rate: 69   Assessment/Plan: Chronic Biventricular Heart Failure - Etiology uncertain, possible ischemic CM given known h/o CAD, though no recent CP. Remote inferior MI in 2000 w/ RCA PCI. Cath report unavailable, uncertain if any additional disease  - Echo (2019): showed normal LVEF, 55-60%, and normal RV - drop in EF 04/2021, down to 25-30% w/ mod-severely reduced RV, in the setting of severe illness from septic shock, hypoxic respiratory failure, in setting of rt lung empyema and esophageal perforation requiring emergency surgery, c/b perioperative PE and NSTEMI (medically managed in setting of severe illness) - Echo (12/2021): EF 25-30%, RV moderately reduced  - NYHA II-early III, functional class confounded by general physical deconditioning. Euvolemic on exam.  - Continue Lasix 40 mg daily - Continue metoprolol XL 12.5 mg daily.  - Continue Entresto 97-103 mg BID. - Continue spironolactone 12.5 mg daily. - Increase hydralazine to 50 mg TID + Imdur to 30 mg daily.  - Intolerant of SGLT2i due to UTIs (previously tried [place]). - Referred to HF paramedicine to help manage medications as she no longer gets Home Health.  - She denies CP, but should be considered for [facility] if EF does not improve w/ optimization of meds. Will determine after f/u echo in 05/2022   2. CAD  - h/o CAD, s/p remote inferior wall MI in 2000 treated w/ PCI to RCA - NST 2019 c/w prior inferior myocardial infarction. Small mild distal anterior/apical infarct with mild peri-infarct ischemia>> Low risk study  - denies recent CP but w/ drop in EF, may need R/LHC if EF does not improve w/ med optimization (followed by Dr.  Terrence Dupont)  - Continue ASA, statin and ? blocker.   3. PAF - SB on ECG 02/26/22. - amiodarone discontinued 02/26/22 - Continue metoprolol XL.  - Continue Eliquis, no bleeding issues.   4. H/o PE - On Eliquis    5. Hypertension  - BP elevated, 148/66 - GDMT titration per above.   6. Type 2DM  - Managed by PCP - On insulin - Failed SGLT2i due to frequent UTIs   Follow up 6 weeks with APP.   Audry Riles, PharmD, BCPS, BCCP, CPP Heart Failure Clinic Pharmacist [phone]

## 2022-03-11 DIAGNOSIS — K219 Gastro-esophageal reflux disease without esophagitis: Secondary | ICD-10-CM | POA: Diagnosis not present

## 2022-03-11 DIAGNOSIS — Z794 Long term (current) use of insulin: Secondary | ICD-10-CM | POA: Diagnosis not present

## 2022-03-11 DIAGNOSIS — M8580 Other specified disorders of bone density and structure, unspecified site: Secondary | ICD-10-CM | POA: Diagnosis not present

## 2022-03-11 DIAGNOSIS — Z9181 History of falling: Secondary | ICD-10-CM | POA: Diagnosis not present

## 2022-03-11 DIAGNOSIS — E785 Hyperlipidemia, unspecified: Secondary | ICD-10-CM | POA: Diagnosis not present

## 2022-03-11 DIAGNOSIS — Z7982 Long term (current) use of aspirin: Secondary | ICD-10-CM | POA: Diagnosis not present

## 2022-03-11 DIAGNOSIS — I5020 Unspecified systolic (congestive) heart failure: Secondary | ICD-10-CM | POA: Diagnosis not present

## 2022-03-11 DIAGNOSIS — D649 Anemia, unspecified: Secondary | ICD-10-CM | POA: Diagnosis not present

## 2022-03-11 DIAGNOSIS — Z86711 Personal history of pulmonary embolism: Secondary | ICD-10-CM | POA: Diagnosis not present

## 2022-03-11 DIAGNOSIS — G4700 Insomnia, unspecified: Secondary | ICD-10-CM | POA: Diagnosis not present

## 2022-03-11 DIAGNOSIS — I4891 Unspecified atrial fibrillation: Secondary | ICD-10-CM | POA: Diagnosis not present

## 2022-03-11 DIAGNOSIS — I110 Hypertensive heart disease with heart failure: Secondary | ICD-10-CM | POA: Diagnosis not present

## 2022-03-11 DIAGNOSIS — M722 Plantar fascial fibromatosis: Secondary | ICD-10-CM | POA: Diagnosis not present

## 2022-03-11 DIAGNOSIS — Z7901 Long term (current) use of anticoagulants: Secondary | ICD-10-CM | POA: Diagnosis not present

## 2022-03-11 DIAGNOSIS — E1136 Type 2 diabetes mellitus with diabetic cataract: Secondary | ICD-10-CM | POA: Diagnosis not present

## 2022-03-11 DIAGNOSIS — E43 Unspecified severe protein-calorie malnutrition: Secondary | ICD-10-CM | POA: Diagnosis not present

## 2022-03-11 DIAGNOSIS — I2510 Atherosclerotic heart disease of native coronary artery without angina pectoris: Secondary | ICD-10-CM | POA: Diagnosis not present

## 2022-03-12 DIAGNOSIS — I2510 Atherosclerotic heart disease of native coronary artery without angina pectoris: Secondary | ICD-10-CM | POA: Diagnosis not present

## 2022-03-12 DIAGNOSIS — I5022 Chronic systolic (congestive) heart failure: Secondary | ICD-10-CM | POA: Diagnosis not present

## 2022-03-20 ENCOUNTER — Ambulatory Visit (HOSPITAL_COMMUNITY)
Admission: RE | Admit: 2022-03-20 | Discharge: 2022-03-20 | Disposition: A | Discharge status: Home / Self Care | Payer: Medicare Other | Source: Ambulatory Visit | Attending: Cardiology | Admitting: Cardiology

## 2022-03-20 VITALS — BP 148/66 | HR 69 | Wt 118.0 lb

## 2022-03-20 DIAGNOSIS — I480 Paroxysmal atrial fibrillation: Secondary | ICD-10-CM | POA: Diagnosis not present

## 2022-03-20 DIAGNOSIS — I252 Old myocardial infarction: Secondary | ICD-10-CM | POA: Insufficient documentation

## 2022-03-20 DIAGNOSIS — E119 Type 2 diabetes mellitus without complications: Secondary | ICD-10-CM | POA: Diagnosis not present

## 2022-03-20 DIAGNOSIS — I110 Hypertensive heart disease with heart failure: Secondary | ICD-10-CM | POA: Insufficient documentation

## 2022-03-20 DIAGNOSIS — I5082 Biventricular heart failure: Secondary | ICD-10-CM | POA: Diagnosis not present

## 2022-03-20 DIAGNOSIS — Z79899 Other long term (current) drug therapy: Secondary | ICD-10-CM | POA: Diagnosis not present

## 2022-03-20 DIAGNOSIS — I5022 Chronic systolic (congestive) heart failure: Secondary | ICD-10-CM | POA: Diagnosis not present

## 2022-03-20 DIAGNOSIS — Z86711 Personal history of pulmonary embolism: Secondary | ICD-10-CM | POA: Insufficient documentation

## 2022-03-20 DIAGNOSIS — Z7901 Long term (current) use of anticoagulants: Secondary | ICD-10-CM | POA: Insufficient documentation

## 2022-03-20 DIAGNOSIS — Z955 Presence of coronary angioplasty implant and graft: Secondary | ICD-10-CM | POA: Diagnosis not present

## 2022-03-20 DIAGNOSIS — I2510 Atherosclerotic heart disease of native coronary artery without angina pectoris: Secondary | ICD-10-CM | POA: Diagnosis not present

## 2022-03-20 DIAGNOSIS — Z87440 Personal history of urinary (tract) infections: Secondary | ICD-10-CM | POA: Insufficient documentation

## 2022-03-20 DIAGNOSIS — Z794 Long term (current) use of insulin: Secondary | ICD-10-CM | POA: Insufficient documentation

## 2022-03-20 MED ORDER — FUROSEMIDE 80 MG PO TABS: 40.0000 mg | ORAL_TABLET | Freq: Every day | ORAL | 3 refills | Status: DC

## 2022-03-20 MED ORDER — HYDRALAZINE HCL 50 MG PO TABS: 50.0000 mg | ORAL_TABLET | Freq: Three times a day (TID) | ORAL | 5 refills | Status: DC

## 2022-03-20 MED ORDER — ISOSORBIDE MONONITRATE ER 30 MG PO TB24
30.0000 mg | ORAL_TABLET | Freq: Every day | ORAL | 5 refills | Status: DC
Start: 2022-03-20 — End: 2022-04-30

## 2022-03-20 NOTE — Patient Instructions (Signed)
It was a pleasure seeing you today!  MEDICATIONS: -We are changing your medications today -Increase hydralazine to 50 mg (1 tablet) three times daily. You may take 2 tablets of the 25 mg strength three times daily until you pick up the new strength.  -Increase Imdur (isosorbide mononitrate) to 30 mg (1 tablet) daily -Call if you have questions about your medications.   NEXT APPOINTMENT: Return to clinic in 6 weeks with APP clinic.  In general, to take care of your heart failure: -Limit your fluid intake to 2 Liters (half-gallon) per day.   -Limit your salt intake to ideally 2-3 grams (2000-3000 mg) per day. -Weigh yourself daily and record, and bring that "weight diary" to your next appointment.  (Weight gain of 2-3 pounds in 1 day typically means fluid weight.) -The medications for your heart are to help your heart and help you live longer.   -Please contact us before stopping any of your heart medications.  Call the clinic at [phone] with questions or to reschedule future appointments.

## 2022-03-23 ENCOUNTER — Emergency Department (HOSPITAL_COMMUNITY): Payer: Medicare Other

## 2022-03-23 ENCOUNTER — Inpatient Hospital Stay (HOSPITAL_COMMUNITY)
Admission: EM | Admit: 2022-03-23 | Discharge: 2022-03-27 | DRG: 303 | Disposition: A | Discharge status: Home / Self Care | Payer: Medicare Other | Attending: Family Medicine | Admitting: Family Medicine

## 2022-03-23 ENCOUNTER — Other Ambulatory Visit: Payer: Self-pay

## 2022-03-23 DIAGNOSIS — E785 Hyperlipidemia, unspecified: Secondary | ICD-10-CM | POA: Diagnosis not present

## 2022-03-23 DIAGNOSIS — I252 Old myocardial infarction: Secondary | ICD-10-CM | POA: Diagnosis not present

## 2022-03-23 DIAGNOSIS — R0789 Other chest pain: Secondary | ICD-10-CM | POA: Diagnosis not present

## 2022-03-23 DIAGNOSIS — Z7901 Long term (current) use of anticoagulants: Secondary | ICD-10-CM | POA: Diagnosis not present

## 2022-03-23 DIAGNOSIS — I208 Other forms of angina pectoris: Secondary | ICD-10-CM | POA: Diagnosis not present

## 2022-03-23 DIAGNOSIS — I130 Hypertensive heart and chronic kidney disease with heart failure and stage 1 through stage 4 chronic kidney disease, or unspecified chronic kidney disease: Secondary | ICD-10-CM | POA: Diagnosis not present

## 2022-03-23 DIAGNOSIS — E1122 Type 2 diabetes mellitus with diabetic chronic kidney disease: Secondary | ICD-10-CM | POA: Diagnosis not present

## 2022-03-23 DIAGNOSIS — Z955 Presence of coronary angioplasty implant and graft: Secondary | ICD-10-CM

## 2022-03-23 DIAGNOSIS — R0609 Other forms of dyspnea: Secondary | ICD-10-CM | POA: Diagnosis not present

## 2022-03-23 DIAGNOSIS — N1831 Chronic kidney disease, stage 3a: Secondary | ICD-10-CM | POA: Diagnosis present

## 2022-03-23 DIAGNOSIS — I255 Ischemic cardiomyopathy: Secondary | ICD-10-CM | POA: Diagnosis present

## 2022-03-23 DIAGNOSIS — E119 Type 2 diabetes mellitus without complications: Secondary | ICD-10-CM | POA: Diagnosis not present

## 2022-03-23 DIAGNOSIS — I5020 Unspecified systolic (congestive) heart failure: Secondary | ICD-10-CM | POA: Diagnosis not present

## 2022-03-23 DIAGNOSIS — I10 Essential (primary) hypertension: Secondary | ICD-10-CM | POA: Diagnosis present

## 2022-03-23 DIAGNOSIS — Z86711 Personal history of pulmonary embolism: Secondary | ICD-10-CM

## 2022-03-23 DIAGNOSIS — Z823 Family history of stroke: Secondary | ICD-10-CM | POA: Diagnosis not present

## 2022-03-23 DIAGNOSIS — E1169 Type 2 diabetes mellitus with other specified complication: Secondary | ICD-10-CM | POA: Diagnosis present

## 2022-03-23 DIAGNOSIS — Z7982 Long term (current) use of aspirin: Secondary | ICD-10-CM | POA: Diagnosis not present

## 2022-03-23 DIAGNOSIS — Z8249 Family history of ischemic heart disease and other diseases of the circulatory system: Secondary | ICD-10-CM | POA: Diagnosis not present

## 2022-03-23 DIAGNOSIS — K219 Gastro-esophageal reflux disease without esophagitis: Secondary | ICD-10-CM | POA: Diagnosis not present

## 2022-03-23 DIAGNOSIS — Z79899 Other long term (current) drug therapy: Secondary | ICD-10-CM | POA: Diagnosis not present

## 2022-03-23 DIAGNOSIS — I951 Orthostatic hypotension: Secondary | ICD-10-CM

## 2022-03-23 DIAGNOSIS — I25118 Atherosclerotic heart disease of native coronary artery with other forms of angina pectoris: Principal | ICD-10-CM | POA: Diagnosis present

## 2022-03-23 DIAGNOSIS — Z794 Long term (current) use of insulin: Secondary | ICD-10-CM | POA: Diagnosis not present

## 2022-03-23 DIAGNOSIS — R079 Chest pain, unspecified: Secondary | ICD-10-CM | POA: Diagnosis not present

## 2022-03-23 DIAGNOSIS — I480 Paroxysmal atrial fibrillation: Secondary | ICD-10-CM | POA: Diagnosis not present

## 2022-03-23 DIAGNOSIS — I5022 Chronic systolic (congestive) heart failure: Secondary | ICD-10-CM | POA: Diagnosis not present

## 2022-03-23 DIAGNOSIS — Z833 Family history of diabetes mellitus: Secondary | ICD-10-CM | POA: Diagnosis not present

## 2022-03-23 DIAGNOSIS — G4700 Insomnia, unspecified: Secondary | ICD-10-CM | POA: Diagnosis not present

## 2022-03-23 DIAGNOSIS — R0602 Shortness of breath: Secondary | ICD-10-CM | POA: Diagnosis not present

## 2022-03-23 DIAGNOSIS — I2510 Atherosclerotic heart disease of native coronary artery without angina pectoris: Secondary | ICD-10-CM | POA: Diagnosis not present

## 2022-03-23 DIAGNOSIS — Z853 Personal history of malignant neoplasm of breast: Secondary | ICD-10-CM

## 2022-03-23 DIAGNOSIS — Z8541 Personal history of malignant neoplasm of cervix uteri: Secondary | ICD-10-CM

## 2022-03-23 HISTORY — DX: Malignant neoplasm of unspecified site of unspecified female breast: C50.919

## 2022-03-23 HISTORY — DX: Pyothorax without fistula: J86.9

## 2022-03-23 HISTORY — DX: Other specified postprocedural states: Z98.890

## 2022-03-23 HISTORY — DX: Other specified pleural conditions: J94.8

## 2022-03-23 HISTORY — DX: Personal history of malignant neoplasm of cervix uteri: Z85.41

## 2022-03-23 LAB — CBC WITH DIFFERENTIAL/PLATELET
Abs Immature Granulocytes: 0.02 10*3/uL (ref 0.00–0.07)
Basophils Absolute: 0 10*3/uL (ref 0.0–0.1)
Basophils Relative: 0 %
Eosinophils Absolute: 0.1 10*3/uL (ref 0.0–0.5)
Eosinophils Relative: 1 %
HCT: 36.6 % (ref 36.0–46.0)
Hemoglobin: 12.1 g/dL (ref 12.0–15.0)
Immature Granulocytes: 0 %
Lymphocytes Relative: 25 %
Lymphs Abs: 1.5 10*3/uL (ref 0.7–4.0)
MCH: 30 pg (ref 26.0–34.0)
MCHC: 33.1 g/dL (ref 30.0–36.0)
MCV: 90.8 fL (ref 80.0–100.0)
Monocytes Absolute: 0.5 10*3/uL (ref 0.1–1.0)
Monocytes Relative: 9 %
Neutro Abs: 3.9 10*3/uL (ref 1.7–7.7)
Neutrophils Relative %: 65 %
Platelets: 131 10*3/uL — ABNORMAL LOW (ref 150–400)
RBC: 4.03 MIL/uL (ref 3.87–5.11)
RDW: 17.5 % — ABNORMAL HIGH (ref 11.5–15.5)
WBC: 6 10*3/uL (ref 4.0–10.5)
nRBC: 0 % (ref 0.0–0.2)

## 2022-03-23 LAB — BASIC METABOLIC PANEL
Anion gap: 13 (ref 5–15)
BUN: 19 mg/dL (ref 8–23)
CO2: 22 mmol/L (ref 22–32)
Calcium: 9.5 mg/dL (ref 8.9–10.3)
Chloride: 103 mmol/L (ref 98–111)
Creatinine, Ser: 1.12 mg/dL — ABNORMAL HIGH (ref 0.44–1.00)
GFR, Estimated: 50 mL/min — ABNORMAL LOW (ref >60)
Glucose, Bld: 203 mg/dL — ABNORMAL HIGH (ref 70–99)
Potassium: 4.2 mmol/L (ref 3.5–5.1)
Sodium: 138 mmol/L (ref 135–145)

## 2022-03-23 LAB — TROPONIN I (HIGH SENSITIVITY)
Troponin I (High Sensitivity): 21 ng/L — ABNORMAL HIGH (ref <18)
Troponin I (High Sensitivity): 20 ng/L — ABNORMAL HIGH (ref <18)

## 2022-03-23 LAB — BRAIN NATRIURETIC PEPTIDE: B Natriuretic Peptide: 178.6 pg/mL — ABNORMAL HIGH (ref 0.0–100.0)

## 2022-03-23 NOTE — ED Provider Triage Note (Signed)
Emergency Medicine Provider Triage Evaluation Note  Dana Coleman , a 81 y.o. female  was evaluated in triage.  Pt complains of chest pain severe in nature approximately 2 hours ago.  Did take some nitroglycerin which improved with the pain.  There is no alleviating or exacerbating factors.  Prior history of an MI per patient several years ago.  And does have a record of a left heart cath in 2015.  She continues to feel generalized weakness throughout.  She is also endorsing some epigastric abdominal pain worse with palpation.  Review of Systems  Positive: Shortness of breath, chest pain, abdominal pain Negative: Urinary symptoms, fever, cough  Physical Exam  BP 138/68 (BP Location: Left Arm)   Pulse 94   Temp 99 F (37.2 C) (Oral)   Resp (!) 22   SpO2 99%  Gen:   Awake, no distress   Resp:  Normal effort  MSK:   Moves extremities without difficulty  Other:  Frail-appearing, severe tenderness along the epigastric region.  Her lungs are somewhat diminished.  Medical Decision Making  Medically screening exam initiated at 2:11 PM.  Appropriate orders placed.  Dana Coleman was informed that the remainder of the evaluation will be completed by another provider, this initial triage assessment does not replace that evaluation, and the importance of remaining in the ED until their evaluation is complete.     Janeece Fitting, PA-C 03/23/22 1415

## 2022-03-23 NOTE — ED Provider Notes (Signed)
[place] EMERGENCY DEPARTMENT Provider Note   CSN: [id number] Arrival date & time: 03/23/22  1350     History  No chief complaint on file.   Dana Coleman is a 81 y.o. female.  81 year old female with prior medical history as detailed below presents for evaluation.  Patient complains of chest pain.  Chest pain became most concerning to the patient approximately 2 hours prior to arrival.  Patient did take nitroglycerin at home which apparently helped with her symptoms somewhat.  Patient with prior history of MI and heart failure.  She also complains of diffuse weakness.  The history is provided by the patient and medical records.  Chest Pain Pain location:  Substernal area and epigastric Pain quality: aching and pressure   Pain radiates to:  Does not radiate Pain severity:  Moderate Onset quality:  Sudden Duration:  3 hours Timing:  Rare Progression:  Waxing and waning Chronicity:  New      Home Medications Prior to Admission medications   Medication Sig Start Date End Date Taking? Authorizing Provider  acetaminophen (TYLENOL) 500 MG tablet Take 500 mg by mouth every 6 (six) hours as needed for moderate pain.    [provider]  apixaban (ELIQUIS) 5 MG TABS tablet Take 1 tablet (5 mg total) by mouth 2 (two) times daily. 02/25/22   Gifford Shave, MD  aspirin EC 81 MG tablet Take 81 mg by mouth in the morning. Swallow whole.    [provider]  Blood Glucose Monitoring Suppl MISC daily.    [provider]  furosemide (LASIX) 80 MG tablet Take 0.5 tablets (40 mg total) by mouth daily. 03/20/22   Bensimhon, Shaune Pascal, MD  glucose blood (ONE TOUCH ULTRA TEST) test strip CHECK BLOOD SUGAR TWICE DAILY 09/24/18   Everrett Coombe, MD  hydrALAZINE (APRESOLINE) 50 MG tablet Take 1 tablet (50 mg total) by mouth 3 (three) times daily. 03/20/22   Bensimhon, Shaune Pascal, MD  insulin glargine (LANTUS SOLOSTAR) 100 UNIT/ML Solostar Pen Inject 6  Units into the skin in the morning.    [provider]  Insulin Pen Needle (B-D UF III MINI PEN NEEDLES) 31G X 5 MM MISC INJECT INSULIN VIA PEN 6 TIMES DAILY 02/22/18   Everrett Coombe, MD  isosorbide mononitrate (IMDUR) 30 MG 24 hr tablet Take 1 tablet (30 mg total) by mouth daily. 03/20/22   Bensimhon, Shaune Pascal, MD  Melatonin 3 MG TBDP Take 6 mg by mouth at bedtime as needed (sleep). 07/20/21   [provider]  metoprolol succinate (TOPROL-XL) 25 MG 24 hr tablet Take 0.5 tablets (12.5 mg total) by mouth daily. 02/26/22   Milford, Maricela Bo, FNP  omeprazole (PRILOSEC) 20 MG capsule Take 40 mg by mouth every morning. 12/12/20   [provider]  ondansetron (ZOFRAN) 4 MG tablet Take 1 tablet (4 mg total) by mouth daily as needed for nausea or vomiting. 06/17/21 06/17/22  Donney Dice, DO  OneTouch Delica Lancets 74B MISC 1 Container by Does not apply route as needed. 09/12/21   Zenia Resides, MD  rosuvastatin (CRESTOR) 20 MG tablet TAKE 1 TABLET BY MOUTH EVERYDAY AT BEDTIME 12/11/21   Gifford Shave, MD  sacubitril-valsartan (ENTRESTO) 97-103 MG Take 1 tablet by mouth 2 (two) times daily. 01/15/22   Consuelo Pandy, PA-C  spironolactone (ALDACTONE) 25 MG tablet Take 0.5 tablets (12.5 mg total) by mouth daily. 02/26/22 02/21/23  Rafael Bihari, FNP  traZODone (DESYREL) 50 MG tablet Take  50 mg by mouth at bedtime as needed for sleep. 07/20/21   [provider]      Allergies    Patient has no known allergies.    Review of Systems   Review of Systems  Cardiovascular:  Positive for chest pain.  All other systems reviewed and are negative.   Physical Exam Updated Vital Signs BP 122/67   Pulse 67   Temp 99 F (37.2 C) (Oral)   Resp 11   SpO2 98%  Physical Exam Vitals and nursing note reviewed.  Constitutional:      General: She is not in acute distress.    Appearance: Normal appearance. She is well-developed.  HENT:     Head: Normocephalic and atraumatic.   Eyes:     Conjunctiva/sclera: Conjunctivae normal.     Pupils: Pupils are equal, round, and reactive to light.  Cardiovascular:     Rate and Rhythm: Normal rate and regular rhythm.     Heart sounds: Normal heart sounds.  Pulmonary:     Effort: Pulmonary effort is normal. No respiratory distress.     Breath sounds: Normal breath sounds.  Abdominal:     General: There is no distension.     Palpations: Abdomen is soft.     Tenderness: There is no abdominal tenderness.  Musculoskeletal:        General: No deformity. Normal range of motion.     Cervical back: Normal range of motion and neck supple.  Skin:    General: Skin is warm and dry.  Neurological:     General: No focal deficit present.     Mental Status: She is alert and oriented to person, place, and time.     ED Results / Procedures / Treatments   Labs (all labs ordered are listed, but only abnormal results are displayed) Labs Reviewed  BASIC METABOLIC PANEL - Abnormal; Notable for the following components:      Result Value   Glucose, Bld 203 (*)    Creatinine, Ser 1.12 (*)    GFR, Estimated 50 (*)    All other components within normal limits  CBC WITH DIFFERENTIAL/PLATELET - Abnormal; Notable for the following components:   RDW 17.5 (*)    Platelets 131 (*)    All other components within normal limits  BRAIN NATRIURETIC PEPTIDE - Abnormal; Notable for the following components:   B Natriuretic Peptide 178.6 (*)    All other components within normal limits  TROPONIN I (HIGH SENSITIVITY) - Abnormal; Notable for the following components:   Troponin I (High Sensitivity) 21 (*)    All other components within normal limits  TROPONIN I (HIGH SENSITIVITY) - Abnormal; Notable for the following components:   Troponin I (High Sensitivity) 20 (*)    All other components within normal limits    EKG  EKG Interpretation  Date/Time:  Sunday March 23 2022 13:54:54 EDT Ventricular Rate:  98 PR Interval:  194 QRS  Duration: 120 QT Interval:  374 QTC Calculation: 477 R Axis:   117 Text Interpretation: Normal sinus rhythm Right bundle branch block Left posterior fascicular block  Bifascicular block  Abnormal ECG When compared with ECG of 26-Feb-2022 13:53, PREVIOUS ECG IS PRESENT Confirmed by Dene Gentry [phone]) on 03/23/2022 4:30:28 PM  Radiology DG Chest 2 View  Result Date: 03/23/2022 CLINICAL DATA:  Short of breath.  Chest pain. EXAM: CHEST - 2 VIEW COMPARISON:  12/29/2021. FINDINGS: Cardiac silhouette is mildly enlarged. No mediastinal or hilar masses. No evidence of  adenopathy. Clear lungs.  No pleural effusion or pneumothorax. Previous thyroid surgery, stable. Skeletal structures are intact. IMPRESSION: No active cardiopulmonary disease. Electronically Signed   By: Lajean Manes M.D.   On: 03/23/2022 15:10    Procedures Procedures    Medications Ordered in ED Medications - No data to display  ED Course/ Medical Decision Making/ A&P                           Medical Decision Making   Medical Screen Complete  This patient presented to the ED with complaint of chest pain.  This complaint involves an extensive number of treatment options. The initial differential diagnosis includes, but is not limited to, ACS, angina, metabolic abnormality, intrapulmonary pathology, etc.  This presentation is: Acute, Self-Limited, Previously Undiagnosed, Uncertain Prognosis, Complicated, Systemic Symptoms, and Threat to Life/Bodily Function  Patient is presenting with complaint of chest pain.  Patient with history of CAD, status post remote inferior wall MI in 2000, status post PCI,, hypertension, type 2 diabetes, hyperlipidemia.  Patient's initial EKG is without evidence of acute ischemia.  Troponin x2 is 21 and then 20 on repeat.  BNP is 178.  Other screening labs are without significant acute abnormality.  Patient with known history of CAD and would benefit from admission and  observation.  Family medicine teaching team is aware of case and will evaluate for admission   Co morbidities that complicated the patient's evaluation  Known history of CAD, diabetes, hypertension, hyperlipidemia   Additional history obtained:  External records from outside sources obtained and reviewed including prior ED visits and prior Inpatient records.    Lab Tests:  I ordered and personally interpreted labs.  The pertinent results include: CBC, BMP, BNP, troponin   Imaging Studies ordered:  I ordered imaging studies including chest x-ray I independently visualized and interpreted obtained imaging which showed NAD I agree with the radiologist interpretation.   Cardiac Monitoring:  The patient was maintained on a cardiac monitor.  I personally viewed and interpreted the cardiac monitor which showed an underlying rhythm of: NSR   Problem List / ED Course:  Chest pain   Reevaluation:  After the interventions noted above, I reevaluated the patient and found that they have: improved   Disposition:  After consideration of the diagnostic results and the patients response to treatment, I feel that the patent would benefit from admission.          Final Clinical Impression(s) / ED Diagnoses Final diagnoses:  Chest pain, unspecified type    Rx / DC Orders ED Discharge Orders     None         Valarie Merino, MD 03/23/22 2234

## 2022-03-23 NOTE — H&P (Incomplete)
Hospital Admission History and Physical Service Pager: [phone]  Patient name: SEMONE Coleman Medical record number: [id number] Date of Birth: [birth year] Age: 81 y.o. Gender: female  Primary Care Provider: Gifford Shave, MD Consultants: None Code Status: Full Code   Preferred Emergency Contact: Dana Coleman [phone] or [phone] for home number if no answer on cell  Chief Complaint: Chest Pain  Assessment and Plan: Dana Coleman is a 81 y.o. female presenting with chest pain . Differential for this patient's presentation of this includes ACS, CHF, Pneumonia, PE. These are less likely causes of presenting symptoms due to benign troponin's and EKG, normal volume status on exam, and relief of chest pain with nitroglycerine, and lack of fever/elevated WBC.   PMH significant for HFrEF, HTN, HLD, CAD, DM2, GERD, Hx of breast cancer, hx of cervical cancer.  Chest pain Patient presents with chest that began this morning and improved with nitroglycerin from relative.  Patient has history of CAD with previous stent placed for for Inferior MI in 2000. In the ED troponins were 21 and 20.  BNP was 178.6, EKG demonstrated no ischemic changes.  EKG and troponins making ACS less likely.  BNP also makes heart failure exacerbation less likely as patient has previous history of BNPs ranging in the 1,000-2,000 back in admission for May, and was euvolemic on exam. Last echo 12/30/2021 with EF 25 to 30%. Patient is on eliquis for A. Fib, making PE less likely. Patient reports she has been feeling fatigued and has some shortness of breath for the last 2 days but has been afebrile with with normal white blood cell count on labs, making pneumonia less likely. Patient admitted for observation, will continue to monitor.  -Admit to Dana Coleman, attending Dr. McDiarmid -Continue to monitor -Cardiac monitoring -Vitals per unit -Diet: Heart Healthy/Carb Modified -DVT ppx: Eliquis 5 mg  BID -PT/OT  Insomnia Hx of insomnia, home meds: melatonin and trazodone -continue home meds  Atrial fibrillation with RVR ([place]) Patient with Hx of Afib RVR. HR 60-70's on admission. Home meds apixiban 5 mg BID, ASA 81 mg daily. -Continue home med  Stage 3a chronic kidney disease (CKD) (HCC) Stable, Cr on admission 1.12, baseline 1.05-1.10.  HFrEF (heart failure with reduced ejection fraction) ([place]) Last echo 12/30/2021 with EF 25 to 30%.,  Dry weight 56 kg, weight on admission 53.5 kg. -Continue to monitor -Strict I/O's -Lasix 80 mg daily, Entresto 97-103 mg, spironolactone 12.5 mg, metoprolol succinate 12.5 mg  GERD (gastroesophageal reflux disease) Home med: prilosec 20 mg -Start protonix 40 mg qd  Type 2 diabetes mellitus with other specified complication (Dana Coleman) Home med: 6 units lantus.  -Very sensitive SSI with CBG during meals, qhs, and ac -Holding home meds  CAD (coronary artery disease), native coronary artery Home med ASA 81 mg -Resume home med  Hypertension Patient with history of hypertension.  BP on admission 130/65, home meds include Lasix 80 mg daily, hydralazine 50 mg 3 times daily, Imdur 30 mg daily, metoprolol succinate 12.5, Entresto 97-103, spironolactone 12.5 daily. -Continue home meds   Hyperlipidemia Last lipid panel 11/06/2021, with cholesterol 109, HDL 25, LDL 58.  Patient at goal on therapy. Patient taking Crestor 20 mg daily.  -Continue Crestor  Malignant neoplasm of female breast [name] Patient with remote history of breat cancer, s/p lumpectomy 2009    FEN/GI: Heart Healthy/Carb Modified VTE Prophylaxis: Apixiban 5 mg BID  Disposition: Admit to med tele  History of Present Illness:  Dana Coleman is  a 81 y.o. female presenting with chest pain.  Patient notes that she felt tired yesterday and did not want to do anything, and later started having shortness of breath that did not go away.  This morning she woke up with some SOB, and  feeling weak, she ate her breakfast and took her morning meds.  After her morning meds she appreciated chest pain located in the center of her chest, with numbness radiating to her right arm.  She called her sister who gave her a pill to put on her tongue that helped with chest pain symptoms.  Patient reports remote history of inferior MI with stent placement.  Patient also notes she has history of CHF, and sleeps with 2 pillows for fear of shortness of breath while laying flat.  Patient also notes shortness of breath with increased activity.  Been having trouble in the abdomen. Feels like something in moving around in belly, like fluid, changes when she lays on different sides. Has had fluid on belly and in lungs before. And appreciates that it's sore on her left side, under the rib. Belly pain has been an issues since admission in July. Had talked to PCP about it, who recommended pain pills. Patient complaining of abdominal pain, but non TTP when palpated on exam.    Review Of Systems: Per HPI with the following additions:   Pertinent Past Medical History: CHF, HTN, Inferior MI, Hx of breast cancer, Hx of cervical cancer  Remainder reviewed in history tab.   Pertinent Past Surgical History: Stent placed around 2003, 20 years ago for inferior MI Lung surgery last July   Remainder reviewed in history tab.  Pertinent Social History: Tobacco use: No Alcohol use: Infrequent, over a year since last drink Other Substance use: None Lives Alone  Pertinent Family History:  Remainder reviewed in history tab.   Important Outpatient Medications:  Remainder reviewed in medication history.   Objective: BP 100/64   Pulse 72   Temp 99 F (37.2 C) (Oral)   Resp 13   SpO2 96%  Exam: General: Frail, elderly, NAD, conversant, polite, African American woman ENTM: MMM Neck: Soft, no lymphadenopathy Cardiovascular: RRR, NRMG Respiratory: CTABL, no wheezes or crackles Gastrointestinal: Soft,  slight TTP in LUQ MSK: No edema, pulses intact in all extremities Psych: Good mood and affect  Labs:  CBC BMET  Recent Labs  Lab 03/23/22 1433  WBC 6.0  HGB 12.1  HCT 36.6  PLT 131*   Recent Labs  Lab 03/24/22 0502  NA 141  K 4.1  CL 106  CO2 25  BUN 19  CREATININE 1.22*  GLUCOSE 111*  CALCIUM 9.1    Pertinent additional labs none.  EKG: My own interpretation (not copied from electronic read) Normal sinus, QTc 477, history of RBBB present on last EKG.    Imaging Studies Performed:    Holley Bouche, MD 03/24/2022, 6:35 AM PGY-1, [place] Intern pager: [phone], text pages welcome Secure chat group [place]

## 2022-03-23 NOTE — ED Triage Notes (Signed)
Patient reports that she developed cp this am and took 2 sl ntg with relief. On assessment patient also complains of epigastric pain. Alert and oriented, speaking clear sentences

## 2022-03-23 NOTE — ED Notes (Signed)
3 failed attempts at collecting trop

## 2022-03-24 ENCOUNTER — Encounter (HOSPITAL_COMMUNITY): Payer: Self-pay | Admitting: Student

## 2022-03-24 DIAGNOSIS — I480 Paroxysmal atrial fibrillation: Secondary | ICD-10-CM | POA: Diagnosis not present

## 2022-03-24 DIAGNOSIS — I5020 Unspecified systolic (congestive) heart failure: Secondary | ICD-10-CM | POA: Diagnosis not present

## 2022-03-24 DIAGNOSIS — R0609 Other forms of dyspnea: Secondary | ICD-10-CM

## 2022-03-24 DIAGNOSIS — N1831 Chronic kidney disease, stage 3a: Secondary | ICD-10-CM | POA: Insufficient documentation

## 2022-03-24 DIAGNOSIS — I10 Essential (primary) hypertension: Secondary | ICD-10-CM | POA: Diagnosis not present

## 2022-03-24 DIAGNOSIS — E1169 Type 2 diabetes mellitus with other specified complication: Secondary | ICD-10-CM | POA: Diagnosis not present

## 2022-03-24 DIAGNOSIS — R079 Chest pain, unspecified: Secondary | ICD-10-CM

## 2022-03-24 DIAGNOSIS — G4700 Insomnia, unspecified: Secondary | ICD-10-CM | POA: Insufficient documentation

## 2022-03-24 LAB — BASIC METABOLIC PANEL
Anion gap: 10 (ref 5–15)
BUN: 19 mg/dL (ref 8–23)
CO2: 25 mmol/L (ref 22–32)
Calcium: 9.1 mg/dL (ref 8.9–10.3)
Chloride: 106 mmol/L (ref 98–111)
Creatinine, Ser: 1.22 mg/dL — ABNORMAL HIGH (ref 0.44–1.00)
GFR, Estimated: 45 mL/min — ABNORMAL LOW (ref >60)
Glucose, Bld: 111 mg/dL — ABNORMAL HIGH (ref 70–99)
Potassium: 4.1 mmol/L (ref 3.5–5.1)
Sodium: 141 mmol/L (ref 135–145)

## 2022-03-24 LAB — CBG MONITORING, ED
Glucose-Capillary: 121 mg/dL — ABNORMAL HIGH (ref 70–99)
Glucose-Capillary: 143 mg/dL — ABNORMAL HIGH (ref 70–99)
Glucose-Capillary: 177 mg/dL — ABNORMAL HIGH (ref 70–99)

## 2022-03-24 LAB — GLUCOSE, CAPILLARY
Glucose-Capillary: 134 mg/dL — ABNORMAL HIGH (ref 70–99)
Glucose-Capillary: 128 mg/dL — ABNORMAL HIGH (ref 70–99)

## 2022-03-24 LAB — HEMOGLOBIN A1C
Hgb A1c MFr Bld: 8.1 % — ABNORMAL HIGH (ref 4.8–5.6)
Mean Plasma Glucose: 185.77 mg/dL

## 2022-03-24 MED ORDER — PANTOPRAZOLE SODIUM 40 MG PO TBEC
40.0000 mg | DELAYED_RELEASE_TABLET | Freq: Every day | ORAL | Status: DC
  Administered 2022-03-24 – 2022-03-27 (×4): 40 mg via ORAL
  Filled 2022-03-24 (×4): qty 1

## 2022-03-24 MED ORDER — INSULIN ASPART 100 UNIT/ML IJ SOLN: 0.0000 [IU] | Freq: Three times a day (TID) | INTRAMUSCULAR | Status: DC

## 2022-03-24 MED ORDER — ISOSORBIDE MONONITRATE ER 30 MG PO TB24
30.0000 mg | ORAL_TABLET | Freq: Every day | ORAL | Status: DC
  Administered 2022-03-24 – 2022-03-27 (×4): 30 mg via ORAL
  Filled 2022-03-24 (×4): qty 1

## 2022-03-24 MED ORDER — APIXABAN 5 MG PO TABS
5.0000 mg | ORAL_TABLET | Freq: Two times a day (BID) | ORAL | Status: DC
  Administered 2022-03-24 (×2): 5 mg via ORAL
  Filled 2022-03-24 (×2): qty 1

## 2022-03-24 MED ORDER — MELATONIN 3 MG PO TABS
6.0000 mg | ORAL_TABLET | Freq: Every evening | ORAL | Status: DC | PRN
  Administered 2022-03-24: 6 mg via ORAL
  Filled 2022-03-24: qty 2

## 2022-03-24 MED ORDER — ROSUVASTATIN CALCIUM 20 MG PO TABS
20.0000 mg | ORAL_TABLET | Freq: Every day | ORAL | Status: DC
  Administered 2022-03-24 – 2022-03-26 (×4): 20 mg via ORAL
  Filled 2022-03-24 (×4): qty 1

## 2022-03-24 MED ORDER — TRAZODONE HCL 50 MG PO TABS
50.0000 mg | ORAL_TABLET | Freq: Every evening | ORAL | Status: DC | PRN
Start: 2022-03-23 — End: 2022-03-27
  Administered 2022-03-24: 50 mg via ORAL
  Filled 2022-03-24: qty 1

## 2022-03-24 MED ORDER — HYDRALAZINE HCL 50 MG PO TABS
50.0000 mg | ORAL_TABLET | Freq: Three times a day (TID) | ORAL | Status: DC
  Administered 2022-03-24 (×2): 50 mg via ORAL
  Filled 2022-03-24 (×2): qty 2

## 2022-03-24 MED ORDER — INSULIN ASPART 100 UNIT/ML IJ SOLN
0.0000 [IU] | Freq: Three times a day (TID) | INTRAMUSCULAR | Status: DC
  Administered 2022-03-24: 1 [IU] via SUBCUTANEOUS

## 2022-03-24 MED ORDER — SPIRONOLACTONE 12.5 MG HALF TABLET
12.5000 mg | ORAL_TABLET | Freq: Every day | ORAL | Status: DC
  Administered 2022-03-25 – 2022-03-27 (×3): 12.5 mg via ORAL
  Filled 2022-03-24 (×4): qty 1

## 2022-03-24 MED ORDER — METOPROLOL SUCCINATE ER 25 MG PO TB24
12.5000 mg | ORAL_TABLET | Freq: Every day | ORAL | Status: DC
  Administered 2022-03-24 – 2022-03-27 (×4): 12.5 mg via ORAL
  Filled 2022-03-24 (×4): qty 1

## 2022-03-24 MED ORDER — HEPARIN (PORCINE) 25000 UT/250ML-% IV SOLN
800.0000 [IU]/h | INTRAVENOUS | Status: DC
  Administered 2022-03-24: 650 [IU]/h via INTRAVENOUS
  Filled 2022-03-24: qty 250

## 2022-03-24 MED ORDER — INSULIN ASPART 100 UNIT/ML IJ SOLN
0.0000 [IU] | Freq: Three times a day (TID) | INTRAMUSCULAR | Status: DC
  Administered 2022-03-25: 2 [IU] via SUBCUTANEOUS
  Administered 2022-03-25 – 2022-03-26 (×3): 1 [IU] via SUBCUTANEOUS

## 2022-03-24 MED ORDER — FUROSEMIDE 40 MG PO TABS
40.0000 mg | ORAL_TABLET | Freq: Every day | ORAL | Status: DC
  Administered 2022-03-24 – 2022-03-26 (×3): 40 mg via ORAL
  Filled 2022-03-24: qty 2
  Filled 2022-03-24 (×2): qty 1

## 2022-03-24 MED ORDER — ASPIRIN 81 MG PO TBEC
81.0000 mg | DELAYED_RELEASE_TABLET | Freq: Every morning | ORAL | Status: DC
  Administered 2022-03-24 – 2022-03-27 (×4): 81 mg via ORAL
  Filled 2022-03-24 (×4): qty 1

## 2022-03-24 MED ORDER — SACUBITRIL-VALSARTAN 97-103 MG PO TABS
1.0000 | ORAL_TABLET | Freq: Two times a day (BID) | ORAL | Status: DC
  Administered 2022-03-24 – 2022-03-25 (×3): 1 via ORAL
  Filled 2022-03-24 (×5): qty 1

## 2022-03-24 MED ORDER — HYDRALAZINE HCL 10 MG PO TABS
10.0000 mg | ORAL_TABLET | Freq: Three times a day (TID) | ORAL | Status: DC
  Administered 2022-03-24 – 2022-03-25 (×2): 10 mg via ORAL
  Filled 2022-03-24 (×2): qty 1

## 2022-03-24 NOTE — Assessment & Plan Note (Deleted)
Stable, Cr on admission 1.12, baseline 1.05-1.10.

## 2022-03-24 NOTE — Assessment & Plan Note (Deleted)
Patient with remote history of breat cancer, s/p lumpectomy 2009

## 2022-03-24 NOTE — Assessment & Plan Note (Deleted)
Home med ASA 81 mg -Resume home med

## 2022-03-24 NOTE — Evaluation (Signed)
Physical Therapy Evaluation Patient Details Name: Dana Coleman MRN: [id number] DOB: Nov 24, 1940 Today's Date: 03/24/2022  History of Present Illness  Pt is an 81 y/o female admitted secondary to chest pain and SOB. PMH includes PE, HTN, DM, a fib, breast cancer, CAD and MI.  Clinical Impression  Pt admitted secondary to problem above with deficits below. Pt limited this session secondary to hypotension and was only able to perform bed mobility tasks. Required min guard A for safety. Anticipate pt will progress well once symptoms improve. Will continue to follow acutely.    03/24/22 1528 03/24/22 1531  Orthostatic Lying   BP- Lying (!) 83/55 94/55  Pulse- Lying 82 81  Orthostatic Sitting  BP- Sitting (!) 78/53  --   Pulse- Sitting 88  --          Recommendations for follow up therapy are one component of a multi-disciplinary discharge planning process, led by the attending physician.  Recommendations may be updated based on patient status, additional functional criteria and insurance authorization.  Follow Up Recommendations Home health PT (pending progression)    Assistance Recommended at Discharge Intermittent Supervision/Assistance  Patient can return home with the following  Assistance with cooking/housework    Equipment Recommendations Other (comment) (TBD)  Recommendations for Other Services       Functional Status Assessment Patient has had a recent decline in their functional status and demonstrates the ability to make significant improvements in function in a reasonable and predictable amount of time.     Precautions / Restrictions Precautions Precautions: Fall Precaution Comments: watch BP Restrictions Weight Bearing Restrictions: No      Mobility  Bed Mobility Overal bed mobility: Needs Assistance Bed Mobility: Supine to Sit, Sit to Supine     Supine to sit: Min guard Sit to supine: Min guard   General bed mobility comments: Min guard for safety. Pt  reporting increased dizziness and BP dropping to 78/53 so further mobility deferred    Transfers                        Ambulation/Gait                  Stairs            Wheelchair Mobility    Modified Rankin (Stroke Patients Only)       Balance Overall balance assessment: Needs assistance Sitting-balance support: No upper extremity supported, Feet supported Sitting balance-Leahy Scale: Good                                       Pertinent Vitals/Pain Pain Assessment Pain Assessment: Faces Faces Pain Scale: Hurts little more Pain Location: abdomen Pain Descriptors / Indicators: Grimacing, Guarding Pain Intervention(s): Limited activity within patient's tolerance, Monitored during session, Repositioned    Home Living Family/patient expects to be discharged to:: Private residence Living Arrangements: Alone Available Help at Discharge: Family Type of Home: House Home Access: Stairs to enter Entrance Stairs-Rails: Left Entrance Stairs-Number of Steps: 3   Home Layout: One level Home Equipment: Conservation officer, nature (2 wheels);Cane - single point;Shower seat;BSC/3in1;Wheelchair - manual      Prior Function Prior Level of Function : Independent/Modified Independent             Mobility Comments: Uses cane for ambulation       Hand Dominance  Extremity/Trunk Assessment   Upper Extremity Assessment Upper Extremity Assessment: Defer to OT evaluation    Lower Extremity Assessment Lower Extremity Assessment: Generalized weakness    Cervical / Trunk Assessment Cervical / Trunk Assessment: Normal  Communication   Communication: No difficulties  Cognition Arousal/Alertness: Awake/alert Behavior During Therapy: WFL for tasks assessed/performed Overall Cognitive Status: No family/caregiver present to determine baseline cognitive functioning                                          General Comments       Exercises     Assessment/Plan    PT Assessment Patient needs continued PT services  PT Problem List Decreased strength;Decreased activity tolerance;Decreased balance;Decreased knowledge of use of DME;Decreased knowledge of precautions;Cardiopulmonary status limiting activity;Decreased mobility       PT Treatment Interventions DME instruction;Gait training;Stair training;Functional mobility training;Therapeutic activities;Therapeutic exercise;Balance training;Patient/family education    PT Goals (Current goals can be found in the Care Plan section)  Acute Rehab PT Goals Patient Stated Goal: to go home PT Goal Formulation: With patient Time For Goal Achievement: 04/07/22 Potential to Achieve Goals: Good    Frequency Min 3X/week     Co-evaluation               AM-PAC PT "6 Clicks" Mobility  Outcome Measure Help needed turning from your back to your side while in a flat bed without using bedrails?: None Help needed moving from lying on your back to sitting on the side of a flat bed without using bedrails?: A Little Help needed moving to and from a bed to a chair (including a wheelchair)?: A Little Help needed standing up from a chair using your arms (e.g., wheelchair or bedside chair)?: A Little Help needed to walk in hospital room?: A Little Help needed climbing 3-5 steps with a railing? : A Little 6 Click Score: 19    End of Session   Activity Tolerance: Treatment limited secondary to medical complications (Comment) (hypotension) Patient left: in bed;with call bell/phone within reach (on stretcher in ED) Nurse Communication: Mobility status PT Visit Diagnosis: Unsteadiness on feet (R26.81);Muscle weakness (generalized) (M62.81);Difficulty in walking, not elsewhere classified (R26.2)    Time: 5400-8676 PT Time Calculation (min) (ACUTE ONLY): 10 min   Charges:   PT Evaluation $PT Eval Moderate Complexity: 1 Mod          Reuel Derby, PT, DPT  Acute  Rehabilitation Services  Office: [phone]   Rudean Hitt 03/24/2022, 4:36 PM

## 2022-03-24 NOTE — ED Notes (Signed)
Pt assisted on bedside commode.

## 2022-03-24 NOTE — Progress Notes (Signed)
Spoke with patient's cardiologist Dr. Terrence Dupont regarding consult, he will see patient later today.

## 2022-03-24 NOTE — ED Notes (Signed)
Physical therapy at bedside

## 2022-03-24 NOTE — Assessment & Plan Note (Addendum)
No further cardiac-like events overnight. Cardiology (Dr. Terrence Dupont) discussed with patient and opted to proceed with nuclear stress testing. I discussed abdominal discomfort with patient and believe it to be related to musculoskeletal discomfort and scarring from prior procedure. The pain has remained consistent and relieves with tylenol and has continued to unaffect her eating/drinking. -Cardiology following, appreciate recommendations -PT/OT eval and treat -Nuclear stress testing today

## 2022-03-24 NOTE — Plan of Care (Signed)
  Problem: Education: Goal: Knowledge of General Education information will improve Description: Including pain rating scale, medication(s)/side effects and non-pharmacologic comfort measures Outcome: Progressing   Problem: Health Behavior/Discharge Planning: Goal: Ability to manage health-related needs will improve Outcome: Progressing   Problem: Clinical Measurements: Goal: Ability to maintain clinical measurements within normal limits will improve Outcome: Progressing   Problem: Clinical Measurements: Goal: Cardiovascular complication will be avoided Outcome: Progressing   Problem: Pain Managment: Goal: General experience of comfort will improve Outcome: Progressing   Problem: Safety: Goal: Ability to remain free from injury will improve Outcome: Progressing   Problem: Skin Integrity: Goal: Risk for impaired skin integrity will decrease Outcome: Progressing

## 2022-03-24 NOTE — Progress Notes (Signed)
     Daily Progress Note Intern Pager: [phone]  Patient name: Dana Coleman Medical record number: [id number] Date of birth: [birth year] Age: 81 y.o. Gender: female  Primary Care Provider: Gifford Shave, MD Consultants: Cardiology Code Status: Full  Pt Overview and Major Events to Date:  6/11: admitted  Assessment and Plan: Caeli W Blando is a 81 y.o. female who presented with chest pain but work-up unremarkable for ACS, CHF, pneumonia, PE. Pertinent PMH/PSH includes HFrEF, HTN, HLD, CAD, T2DM, GERD, history of breast cancer, history of cervical cancer.  Chest pain No further cardiac-like events overnight.  Prior cardiology note on 5/17 notes that patient should be considered for right/left heart cath if EF does not improve with optimization of medications, therefore, cardiology input is preferred for this current hospitalization. -Cardiology consult -Continuous cardiac monitoring -PT/OT eval and treat  Atrial fibrillation with RVR ([place]) HR 60-80's overnight. Home meds apixiban 5 mg BID, ASA 81 mg daily, Toprol-XL 12.5 mg daily. -Continue home med  HFrEF (heart failure with reduced ejection fraction) ([place]) Last echo 12/30/2021 with EF 25 to 30%. Dry weight 56 kg, weight on admission 53.5 kg. -Continue to monitor -Strict I/O's - Continue Lasix 40 mg daily, Entresto 97-103 mg, spironolactone 12.5 mg, metoprolol succinate 12.5 mg  Type 2 diabetes mellitus with other specified complication (HCC) CBGs well controlled currently ranging from 111-143.  -Very sensitive SSI with CBG during meals, qhs, and ac -Holding home meds of lantus 6u daily  CAD (coronary artery disease), native coronary artery Home med ASA 81 mg -Resume home med  Hypertension BP ranged from 80s-110s/50s-60s, home meds include hydralazine 50 mg 3 times daily, Imdur 30 mg daily, metoprolol succinate 12.5, Entresto 97-103, spironolactone 12.5 daily. -Continue home meds   FEN/GI: Heart healthy/carb  modified PPx: Eliquis 5 mg twice daily Dispo:Home  pending cardiology work-up .   Subjective:  States chest pain has relieved and did not have any further chest pain overnight although continues to have her general rib discomfort.  Objective: Temp:  [99 F (37.2 C)] 99 F (37.2 C) (06/11 1357) Pulse Rate:  [63-94] 71 (06/12 1145) Resp:  [10-23] 12 (06/12 1145) BP: (89-163)/(52-80) 101/59 (06/12 1145) SpO2:  [92 %-100 %] 99 % (06/12 1145) Physical Exam: General: Awake and alert, NAD, frail-appearing Cardiovascular: RRR, no murmurs appreciated Respiratory: CTA B, normal WOB Abdomen: Soft, tender to palpation at left upper quadrant, normoactive bowel sounds  Laboratory: Most recent CBC Lab Results  Component Value Date   WBC 6.0 03/23/2022   HGB 12.1 03/23/2022   HCT 36.6 03/23/2022   MCV 90.8 03/23/2022   PLT 131 (L) 03/23/2022   Most recent BMP    Latest Ref Rng & Units 03/24/2022    5:02 AM  BMP  Glucose 70 - 99 mg/dL 111   BUN 8 - 23 mg/dL 19   Creatinine 0.44 - 1.00 mg/dL 1.22   Sodium 135 - 145 mmol/L 141   Potassium 3.5 - 5.1 mmol/L 4.1   Chloride 98 - 111 mmol/L 106   CO2 22 - 32 mmol/L 25   Calcium 8.9 - 10.3 mg/dL 9.1    CBG (last 3)  Recent Labs    03/24/22 0733 03/24/22 1304  GLUCAP 121* 143*   Imaging/Diagnostic Tests: No new imaging results. Wells Guiles, DO 03/24/2022, 1:49 PM  PGY-1, [place] Intern pager: [phone], text pages welcome Secure chat group [place]

## 2022-03-24 NOTE — Assessment & Plan Note (Addendum)
HR 60-80's overnight. Home meds apixiban 5 mg BID, ASA 81 mg daily, Toprol-XL 12.5 mg daily. -Continue home med -Hold Eliquis for nuclear stress testing, per cardiology

## 2022-03-24 NOTE — Assessment & Plan Note (Deleted)
Home med: prilosec 20 mg -Start protonix 40 mg qd

## 2022-03-24 NOTE — Consult Note (Signed)
Reason for Consult chest pain Referring Physician: Teaching service  Dana Coleman is an 81 y.o. female.  HPI: Patient is 81 year old female with past medical history significant for coronary artery disease history of inferior wall MI in the past status post PCI to RCA status post peri-infarct non-Q wave MI, hypertension, diabetes mellitus, hyperlipidemia, history of bilateral pulmonary embolism in August 2022, history of paroxysmal atrial fibrillation on chronic anticoagulation, ischemic cardiomyopathy, history of congestive heart failure secondary to reduced systolic function, history of cholecystectomy complicated by esophageal perforation and empyema requiring thoracotomy and decortication in the past, was admitted yesterday because of retrosternal chest pain described as pressure associated with shortness of breath and feeling tired states took 1 sublingual nitro from her sister with relief of chest pain states feels weak and tired and something is wrong.  Also complains of vague abdominal pain.  Denies any nausea vomiting denies any fever or chills.  Denies PND orthopnea leg swelling.  Denies any recent cardiac work-up EKG done in the ED showed normal sinus rhythm with right bundle branch block pattern 2 sets of high-sensitivity troponin on are negative.  Patient presently denies any chest pains but complains of vague abdominal pain.  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 Truddie Coco   PEG (percutaneous endoscopic gastrostomy) status ([place]) 07/23/2021   Personal history of  radiation therapy 2009   Respiratory failure ([place]) 05/04/2021    Past Surgical History:  Procedure Laterality Date   BREAST LUMPECTOMY Right 2009   BREAST SURGERY  2009   right lumpectomy   [place] N/A 05/02/2021   Procedure: LAPAROSCOPIC CHOLECYSTECTOMY;  Surgeon: Greer Pickerel, MD;  Location: [facility];  Service: General;  Laterality: N/A;   COLONOSCOPY     IR GASTROSTOMY TUBE MOD SED  06/03/2021   IR GASTROSTOMY TUBE REMOVAL  10/08/2021   LEFT HEART CATHETERIZATION WITH CORONARY ANGIOGRAM N/A 04/20/2014   Procedure: LEFT HEART CATHETERIZATION WITH CORONARY ANGIOGRAM;  Surgeon: Jacolyn Reedy, MD;  Location: [facility] CATH LAB;  Service: Cardiovascular;  Laterality: N/A;   POLYPECTOMY     STENT PLACEMENT VASCULAR ([place] HX)     THORACOTOMY Right 05/05/2021   Procedure: THORACOTOMY MAJOR REPAIR PERFORATED ESOPHAGUS;  Surgeon: Gaye Pollack, MD;  Location: [facility] OR;  Service: Thoracic;  Laterality: Right;   thyroid     for nodule hemithyroidectomy, benign   THYROIDECTOMY Right 03/2003    Family History  Problem Relation Age of Onset   Heart disease Mother        age 24   Heart disease Father    Cancer Father        unknown   Heart disease Brother    Hypertension Brother    Cirrhosis Brother    Diabetes Daughter    Stroke Daughter    Kidney disease Daughter    Hypertension Son    Colon cancer Neg Hx    Colon  polyps Neg Hx    Esophageal cancer Neg Hx    Stomach cancer Neg Hx    Rectal cancer Neg Hx     Social History:  reports that she has never smoked. She has never used smokeless tobacco. She reports that she does not drink alcohol and does not use drugs.  Allergies: No Known Allergies  Medications: I have reviewed the patient's current medications.  Results for orders placed or performed during the hospital encounter of 03/23/22 (from the past 48 hour(s))  Basic metabolic panel     Status: Abnormal   Collection Time: 03/23/22  2:33 PM  Result  Value Ref Range   Sodium 138 135 - 145 mmol/L   Potassium 4.2 3.5 - 5.1 mmol/L   Chloride 103 98 - 111 mmol/L   CO2 22 22 - 32 mmol/L   Glucose, Bld 203 (H) 70 - 99 mg/dL    Comment: Glucose reference range applies only to samples taken after fasting for at least 8 hours.   BUN 19 8 - 23 mg/dL   Creatinine, Ser 1.12 (H) 0.44 - 1.00 mg/dL   Calcium 9.5 8.9 - 10.3 mg/dL   GFR, Estimated 50 (L) >60 mL/min    Comment: (NOTE) Calculated using the CKD-EPI Creatinine Equation (2021)    Anion gap 13 5 - 15    Comment: Performed at [facility] [address], [place], [place] [address]  CBC with Differential     Status: Abnormal   Collection Time: 03/23/22  2:33 PM  Result Value Ref Range   WBC 6.0 4.0 - 10.5 K/uL   RBC 4.03 3.87 - 5.11 MIL/uL   Hemoglobin 12.1 12.0 - 15.0 g/dL   HCT 36.6 36.0 - 46.0 %   MCV 90.8 80.0 - 100.0 fL   MCH 30.0 26.0 - 34.0 pg   MCHC 33.1 30.0 - 36.0 g/dL   RDW 17.5 (H) 11.5 - 15.5 %   Platelets 131 (L) 150 - 400 K/uL    Comment: REPEATED TO VERIFY   nRBC 0.0 0.0 - 0.2 %   Neutrophils Relative % 65 %   Neutro Abs 3.9 1.7 - 7.7 K/uL   Lymphocytes Relative 25 %   Lymphs Abs 1.5 0.7 - 4.0 K/uL   Monocytes Relative 9 %   Monocytes Absolute 0.5 0.1 - 1.0 K/uL   Eosinophils Relative 1 %   Eosinophils Absolute 0.1 0.0 - 0.5 K/uL   Basophils Relative 0 %   Basophils Absolute 0.0 0.0 - 0.1 K/uL   Immature Granulocytes 0 %   Abs Immature Granulocytes 0.02 0.00 - 0.07 K/uL    Comment: Performed at [facility] [address], [place], [place] [address]  Brain natriuretic peptide     Status: Abnormal   Collection Time: 03/23/22  2:33 PM  Result Value Ref Range   B Natriuretic Peptide 178.6 (H) 0.0 - 100.0 pg/mL    Comment: Performed at [facility] [address] [place], [place] [address]  Troponin I (High Sensitivity)     Status: Abnormal   Collection Time: 03/23/22  4:41 PM  Result Value Ref Range   Troponin I (High Sensitivity) 21  (H) <18 ng/L    Comment: (NOTE) Elevated high sensitivity troponin I (hsTnI) values and significant  changes across serial measurements may suggest ACS but many other  chronic and acute conditions are known to elevate hsTnI results.  Refer to the "Links" section for chest pain algorithms and additional  guidance. Performed at [facility], [place] [address] [place], [place] [address]   Troponin I (High Sensitivity)     Status: Abnormal   Collection Time: 03/23/22  6:41 PM  Result Value Ref Range   Troponin I (High Sensitivity) 20 (H) <18 ng/L    Comment: (NOTE) Elevated high sensitivity troponin I (hsTnI) values and significant  changes across serial measurements may suggest ACS but many other  chronic and acute conditions are known to elevate hsTnI results.  Refer to the "Links" section for chest pain algorithms and additional  guidance. Performed at [facility], [place] [address], [place], [place] [address]   Hemoglobin A1c     Status: Abnormal   Collection Time: 03/24/22  5:02 AM  Result Value Ref Range   Hgb A1c MFr Bld 8.1 (H) 4.8 - 5.6 %    Comment: (NOTE) Pre diabetes:          5.7%-6.4%  Diabetes:              >6.4%  Glycemic control for   <7.0% adults with diabetes    Mean Plasma Glucose 185.77 mg/dL    Comment: Performed at [facility] [address], [place], [place] [address]  Basic metabolic panel     Status: Abnormal   Collection Time: 03/24/22  5:02 AM  Result Value Ref Range   Sodium 141 135 - 145 mmol/L   Potassium 4.1 3.5 - 5.1 mmol/L   Chloride 106 98 - 111 mmol/L   CO2 25 22 - 32 mmol/L   Glucose, Bld 111 (H) 70 - 99 mg/dL    Comment: Glucose reference range applies only to samples taken after fasting for at least 8 hours.   BUN 19 8 - 23 mg/dL   Creatinine, Ser 1.22 (H) 0.44 - 1.00 mg/dL   Calcium 9.1 8.9 - 10.3 mg/dL   GFR, Estimated 45 (L) >60 mL/min    Comment: (NOTE) Calculated using the CKD-EPI Creatinine Equation (2021)     Anion gap 10 5 - 15    Comment: Performed at [facility] [address] [place], [place] [address]  CBG monitoring, ED     Status: Abnormal   Collection Time: 03/24/22  7:33 AM  Result Value Ref Range   Glucose-Capillary 121 (H) 70 - 99 mg/dL    Comment: Glucose reference range applies only to samples taken after fasting for at least 8 hours.    DG Chest 2 View  Result Date: 03/23/2022 CLINICAL DATA:  Short of breath.  Chest pain. EXAM: CHEST - 2 VIEW COMPARISON:  12/29/2021. FINDINGS: Cardiac silhouette is mildly enlarged. No mediastinal or hilar masses. No evidence of adenopathy. Clear lungs.  No pleural effusion or pneumothorax. Previous thyroid surgery, stable. Skeletal structures are intact. IMPRESSION: No active cardiopulmonary disease. Electronically Signed   By: Lajean Manes M.D.   On: 03/23/2022 15:10    Review of Systems  Constitutional:  Positive for fatigue. Negative for fever.  HENT:  Negative for sore throat.   Respiratory:  Positive for shortness of breath.   Cardiovascular:  Positive for chest pain. Negative for palpitations and leg swelling.  Gastrointestinal:  Positive for abdominal pain.  Genitourinary:  Negative for difficulty urinating.  Neurological:  Negative for dizziness and seizures.   Blood pressure 108/61, pulse 83, temperature 99 F (37.2 C), temperature source Oral, resp. rate 15, SpO2 98 %. Physical Exam HENT:     Head: Normocephalic and atraumatic.  Eyes:     Extraocular Movements: Extraocular movements intact.     Pupils: Pupils are equal, round, and reactive to light.  Neck:     Vascular: No JVD.  Cardiovascular:     Rate and Rhythm: Normal rate and regular rhythm.     Heart sounds: Murmur (Soft systolic murmur noted no S3 gallop) heard.  Pulmonary:     Effort: Pulmonary effort is normal.     Breath sounds: Normal breath sounds.  Abdominal:     General: Bowel sounds are normal.     Palpations: Abdomen is soft.     Tenderness:  There is abdominal tenderness (Epigastric tenderness noted no guarding). There is no guarding.  Musculoskeletal:     Cervical back: Normal range of motion and neck supple.     Right lower leg: No tenderness. No edema.     Left lower leg: No tenderness. No edema.  Skin:    General: Skin is warm and dry.  Neurological:     General: No focal deficit present.     Mental Status: She is alert and oriented to person, place, and time.     Assessment/Plan: New onset angina MI ruled out Coronary artery disease history of inferior wall MI in the past status post PCI to RCA and perioperative non-Q MI in the past Ischemic cardiomyopathy Compensated systolic congestive heart failure Hypertension Diabetes mellitus Hyperlipidemia History of paroxysmal atrial fibrillation on chronic anticoagulation History of bilateral PE Abdominal pain work-up per primary team Plan Discussed with patient various options of treatment i.e. medical versus noninvasive nuclear stress testing and agrees to proceed with nuclear stress testing. We will hold Eliquis for now and start on heparin per pharmacy protocol   Charolette Forward 03/24/2022, 11:37 AM

## 2022-03-24 NOTE — Assessment & Plan Note (Addendum)
CBGs well controlled currently ranging from 111-143.  -Very sensitive SSI with CBG during meals, qhs, and ac -Holding home meds of lantus 6u daily

## 2022-03-24 NOTE — Progress Notes (Signed)
ANTICOAGULATION CONSULT NOTE - Initial Consult  Pharmacy Consult for Heparin Indication: atrial fibrillation (Eliquis on hold)  No Known Allergies  Patient Measurements:   Heparin Dosing Weight: 53 kg  Vital Signs: BP: 108/61 (06/12 1030) Pulse Rate: 83 (06/12 1030)  Labs: Recent Labs    03/23/22 1433 03/23/22 1641 03/23/22 1841 03/24/22 0502  HGB 12.1  --   --   --   HCT 36.6  --   --   --   PLT 131*  --   --   --   CREATININE 1.12*  --   --  1.22*  TROPONINIHS  --  21* 20*  --     Estimated Creatinine Clearance: 29.1 mL/min (A) (by C-G formula based on SCr of 1.22 mg/dL (H)).   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 Truddie Coco   PEG (percutaneous endoscopic gastrostomy) status ([place]) 07/23/2021   Personal history of radiation therapy 2009   Respiratory failure ([place]) 05/04/2021    Medications:  See electronic med rec  Assessment: 81 y.o. F presents with CP. Pt on Eliquis '5mg'$  po BID for afib - last dose 6/12 at 0945. Currently holding Eliquis and to start heparin gtt for r/o MI as pt may need cath. H/H ok on admission. Plt slightly low at 131. Eliquis will be affecting heparin levels so will utilize aPTT for monitoring.   Goal of Therapy:  aPTT 66-102 seconds; heparin level 0.3-0.7 units/ml Monitor platelets by anticoagulation protocol: Yes   Plan:  At 2200, start heparin gtt at 650 units/hr Will f/u aPTT and heparin level 8 hours post start Daily heparin level, aPTT, and CBC  Sherlon Handing, PharmD, BCPS Please see amion for  complete clinical pharmacist phone list 03/24/2022,12:06 PM

## 2022-03-24 NOTE — Assessment & Plan Note (Addendum)
BP ranged from 90s-100s/60-90s. Blood pressure medications have been adjusted given hypotension. -Imdur 30 mg daily -Toprol-XL 12.5 mg daily -Spironolactone 12.5 mg daily -Entresto 49-51 twice daily -Hydralazine changed to '10mg'$  TID per cardiology

## 2022-03-24 NOTE — Assessment & Plan Note (Deleted)
Hx of insomnia, home meds: melatonin and trazodone -continue home meds

## 2022-03-24 NOTE — Assessment & Plan Note (Deleted)
Last lipid panel 11/06/2021, with cholesterol 109, HDL 25, LDL 58.  Patient at goal on therapy. Patient taking Crestor 20 mg daily.  -Continue Crestor

## 2022-03-24 NOTE — Assessment & Plan Note (Deleted)
Last echo 12/30/2021 with EF 25 to 30%. Dry weight 56 kg, weight on admission 53.5 kg. -Continue to monitor -Strict I/O's - Continue Lasix 40 mg daily, Entresto 97-103 mg BID, spironolactone 12.5 mg, metoprolol succinate 12.5 mg

## 2022-03-25 ENCOUNTER — Telehealth (HOSPITAL_COMMUNITY): Payer: Self-pay | Admitting: Licensed Clinical Social Worker

## 2022-03-25 ENCOUNTER — Observation Stay (HOSPITAL_COMMUNITY): Payer: Medicare Other

## 2022-03-25 DIAGNOSIS — R079 Chest pain, unspecified: Secondary | ICD-10-CM | POA: Diagnosis not present

## 2022-03-25 LAB — CBC
HCT: 34.2 % — ABNORMAL LOW (ref 36.0–46.0)
Hemoglobin: 10.7 g/dL — ABNORMAL LOW (ref 12.0–15.0)
MCH: 29.1 pg (ref 26.0–34.0)
MCHC: 31.3 g/dL (ref 30.0–36.0)
MCV: 92.9 fL (ref 80.0–100.0)
Platelets: 113 10*3/uL — ABNORMAL LOW (ref 150–400)
RBC: 3.68 MIL/uL — ABNORMAL LOW (ref 3.87–5.11)
RDW: 17.8 % — ABNORMAL HIGH (ref 11.5–15.5)
WBC: 4.9 10*3/uL (ref 4.0–10.5)
nRBC: 0 % (ref 0.0–0.2)

## 2022-03-25 LAB — GLUCOSE, CAPILLARY
Glucose-Capillary: 137 mg/dL — ABNORMAL HIGH (ref 70–99)
Glucose-Capillary: 165 mg/dL — ABNORMAL HIGH (ref 70–99)
Glucose-Capillary: 206 mg/dL — ABNORMAL HIGH (ref 70–99)
Glucose-Capillary: 228 mg/dL — ABNORMAL HIGH (ref 70–99)

## 2022-03-25 LAB — HEPARIN LEVEL (UNFRACTIONATED): Heparin Unfractionated: >1.1 IU/mL — ABNORMAL HIGH (ref 0.30–0.70)

## 2022-03-25 LAB — APTT
aPTT: 56 seconds — ABNORMAL HIGH (ref 24–36)
aPTT: 105 seconds — ABNORMAL HIGH (ref 24–36)

## 2022-03-25 MED ORDER — TECHNETIUM TC 99M TETROFOSMIN IV KIT
32.0000 | PACK | Freq: Once | INTRAVENOUS | Status: AC | PRN
  Administered 2022-03-25: 32 via INTRAVENOUS

## 2022-03-25 MED ORDER — SACUBITRIL-VALSARTAN 49-51 MG PO TABS
1.0000 | ORAL_TABLET | Freq: Two times a day (BID) | ORAL | Status: DC
  Administered 2022-03-25 – 2022-03-26 (×3): 1 via ORAL
  Filled 2022-03-25 (×4): qty 1

## 2022-03-25 MED ORDER — NITROGLYCERIN 0.4 MG SL SUBL: 0.4000 mg | SUBLINGUAL_TABLET | SUBLINGUAL | Status: DC | PRN

## 2022-03-25 MED ORDER — REGADENOSON 0.4 MG/5ML IV SOLN
INTRAVENOUS | Status: AC
  Filled 2022-03-25: qty 5

## 2022-03-25 MED ORDER — APIXABAN 2.5 MG PO TABS
2.5000 mg | ORAL_TABLET | Freq: Two times a day (BID) | ORAL | Status: DC
Start: 2022-03-25 — End: 2022-03-25
  Filled 2022-03-25: qty 1

## 2022-03-25 MED ORDER — REGADENOSON 0.4 MG/5ML IV SOLN
0.4000 mg | Freq: Once | INTRAVENOUS | Status: AC
Start: 2022-03-25 — End: 2022-03-25
  Administered 2022-03-25: 0.4 mg via INTRAVENOUS

## 2022-03-25 MED ORDER — SODIUM CHLORIDE 0.9 % IV BOLUS
250.0000 mL | Freq: Once | INTRAVENOUS | Status: AC
  Administered 2022-03-25: 250 mL via INTRAVENOUS

## 2022-03-25 MED ORDER — TECHNETIUM TC 99M TETROFOSMIN IV KIT
10.3000 | PACK | Freq: Once | INTRAVENOUS | Status: AC | PRN
  Administered 2022-03-25: 10.3 via INTRAVENOUS

## 2022-03-25 MED ORDER — APIXABAN 2.5 MG PO TABS
2.5000 mg | ORAL_TABLET | Freq: Two times a day (BID) | ORAL | Status: DC
  Administered 2022-03-25 – 2022-03-27 (×4): 2.5 mg via ORAL
  Filled 2022-03-25 (×4): qty 1

## 2022-03-25 NOTE — Progress Notes (Addendum)
Daily Progress Note Intern Pager: [phone]  Patient name: Dana Coleman Medical record number: [id number] Date of birth: 29-Oct-1940 Age: 81 y.o. Gender: female  Primary Care Provider: Gifford Shave, MD Consultants: Cardiology Code Status: Full  Pt Overview and Major Events to Date:  6/11: admitted 6/13: Nuclear stress testing  Assessment and Plan: [name] is a 80 y.o. female who presented with chest pain but work-up unremarkable for ACS, CHF, pneumonia, PE. Pertinent PMH/PSH includes HFrEF, HTN, HLD, CAD, T2DM, GERD, history of breast cancer, history of cervical cancer.   * Chest pain No further cardiac-like events overnight. Cardiology (Dr. Terrence Dupont) discussed with patient and opted to proceed with nuclear stress testing. -Cardiology following, appreciate recommendations -PT/OT eval and treat -Nuclear stress testing today  Paroxysmal atrial fibrillation ([place]) HR 60-80's overnight. Home meds apixiban 5 mg BID, ASA 81 mg daily, Toprol-XL 12.5 mg daily. -Continue home med  HFrEF (heart failure with reduced ejection fraction) ([place]) Last echo 12/30/2021 with EF 25 to 30%. Dry weight 56 kg, weight on admission 53.5 kg. -Continue to monitor -Strict I/O's - Continue Lasix 40 mg daily, Entresto 97-103 mg BID, spironolactone 12.5 mg, metoprolol succinate 12.5 mg  Type 2 diabetes mellitus with other specified complication (H[facility]) CBGs well controlled currently ranging from 111-143.  -Very sensitive SSI with CBG during meals, qhs, and ac -Holding home meds of lantus 6u daily  Hypertension BP ranged from 90s-100s/60-90s, home meds include hydralazine 50 mg 3 times daily, Imdur 30 mg daily, metoprolol succinate 12.'5mg'$  daily, Entresto 97-103 BID, spironolactone 12.5 daily. -Hydralazine changed to '10mg'$  TID per cardiology -Continue other home medications  FEN/GI: Heart healthy/carb modified PPx: Transition to heparin IV for nuclear stress testing; hold  Eliquis Dispo:Home today. Barriers include nuclear stress testing.   Subjective:  Patient states she is feeling well and is awaiting the test results.  She is amenable to going home today if able. She discussed her abdominal tenderness being the same as it has been in the past since her hospitalization when she required a PEG tube placement. It has remained consistent and improves with Tylenol. She has been able to eat and drink appropriately.  Objective: Temp:  [97.6 F (36.4 C)-98.4 F (36.9 C)] 98.4 F (36.9 C) (06/13 0421) Pulse Rate:  [68-86] 86 (06/13 0421) Resp:  [12-20] 18 (06/13 0421) BP: (95-121)/(54-90) 109/90 (06/13 0421) SpO2:  [97 %-99 %] 99 % (06/13 0421) Weight:  [52.6 kg-54.4 kg] 54.4 kg (06/13 0421) Physical Exam: General: Awake, alert, NAD Cardiovascular: RRR, no murmurs auscultated Respiratory: CTA B, normal WOB  Laboratory: Most recent CBC Lab Results  Component Value Date   WBC 6.0 03/23/2022   HGB 12.1 03/23/2022   HCT 36.6 03/23/2022   MCV 90.8 03/23/2022   PLT 131 (L) 03/23/2022   Most recent BMP    Latest Ref Rng & Units 03/24/2022    5:02 AM  BMP  Glucose 70 - 99 mg/dL 111   BUN 8 - 23 mg/dL 19   Creatinine 0.44 - 1.00 mg/dL 1.22   Sodium 135 - 145 mmol/L 141   Potassium 3.5 - 5.1 mmol/L 4.1   Chloride 98 - 111 mmol/L 106   CO2 22 - 32 mmol/L 25   Calcium 8.9 - 10.3 mg/dL 9.1    Imaging/Diagnostic Tests: No recent imaging. Wells Guiles, DO 03/25/2022, 7:50 AM  PGY-1, [facility] Intern pager: [phone], text pages welcome Secure chat group [place]

## 2022-03-25 NOTE — Progress Notes (Signed)
OT Cancellation Note  Patient Details Name: Dana Coleman MRN: [id number] DOB: October 11, 1941   Cancelled Treatment:    Reason Eval/Treat Not Completed: Patient at procedure or test/ unavailable  Kaily Wragg,HILLARY 03/25/2022, 9:00 AM Maurie Boettcher, OT/L   Acute OT Clinical Specialist Acute Rehabilitation Services Pager [phone] Office [phone]

## 2022-03-25 NOTE — Evaluation (Addendum)
Occupational Therapy Evaluation Patient Details Name: Dana Coleman MRN: [id number] DOB: [birth year] Today's Date: 03/25/2022   History of Present Illness Pt is an 81 y/o female admitted secondary to chest pain and SOB. PMH includes PE, HTN, DM, a fib, breast cancer, CAD, esophogeal perforation, HFrEF and MI.   Clinical Impression   PTA pt lives alone @ modified independent level with use of a cane/RW. Family/friends assist with transportation and IADL tasks if needed.  Pt only able to ambulate @ 20 ft before complaining of weakness, SOB and not feeling well. Also complaining of stomach pain. Pt returned to bed and cardiologist entered room to discuss results of stress test. Educated pt on the need for frequent S after DC. Pt states she can most likely stay with her sister. Recommend HHOT. Acute OT to follow.  Recommend mobilizing with mobility techs.     Recommendations for follow up therapy are one component of a multi-disciplinary discharge planning process, led by the attending physician.  Recommendations may be updated based on patient status, additional functional criteria and insurance authorization.   Follow Up Recommendations  Home health OT    Assistance Recommended at Discharge Frequent or constant Supervision/Assistance  Patient can return home with the following A little help with walking and/or transfers;A little help with bathing/dressing/bathroom;Assistance with cooking/housework;Direct supervision/assist for medications management;Assist for transportation;Help with stairs or ramp for entrance    Functional Status Assessment  Patient has had a recent decline in their functional status and demonstrates the ability to make significant improvements in function in a reasonable and predictable amount of time.  Equipment Recommendations  None recommended by OT    Recommendations for Other Services       Precautions / Restrictions Precautions Precautions: Fall Precaution  Comments: watch BP Restrictions Weight Bearing Restrictions: No      Mobility Bed Mobility Overal bed mobility: Needs Assistance Bed Mobility: Supine to Sit, Sit to Supine     Supine to sit: Min guard Sit to supine: Min guard   General bed mobility comments: Min guard for safety. Pt reporting increased dizziness and BP dropping to 78/53 so further mobility deferred    Transfers Overall transfer level: Needs assistance Equipment used: Rolling walker (2 wheels) Transfers: Sit to/from Stand Sit to Stand: Min guard                  Balance Overall balance assessment: Needs assistance Sitting-balance support: No upper extremity supported, Feet supported Sitting balance-Leahy Scale: Good       Standing balance-Leahy Scale: Poor                             ADL either performed or assessed with clinical judgement   ADL Overall ADL's : Needs assistance/impaired Eating/Feeding: Independent   Grooming: Set up;Supervision/safety   Upper Body Bathing: Set up;Supervision/ safety;Sitting   Lower Body Bathing: Minimal assistance   Upper Body Dressing : Set up;Supervision/safety   Lower Body Dressing: Minimal assistance;Sit to/from stand   Toilet Transfer: Minimal assistance;Ambulation;Rolling walker (2 wheels)   Toileting- Clothing Manipulation and Hygiene: Minimal assistance       Functional mobility during ADLs: Minimal assistance;Rolling walker (2 wheels) General ADL Comments: Easily fatigues Pt has had a fall in the past. Discussed possible consideration of fall alert system. Pt/family verbalized understanding.      Vision         Perception     Praxis  Pertinent Vitals/Pain Pain Assessment Faces Pain Scale: Hurts little more Pain Location: abdomen Pain Descriptors / Indicators: Grimacing, Guarding     Hand Dominance Right   Extremity/Trunk Assessment Upper Extremity Assessment Upper Extremity Assessment: Generalized weakness    Lower Extremity Assessment Lower Extremity Assessment: Defer to PT evaluation   Cervical / Trunk Assessment Cervical / Trunk Assessment: Normal   Communication Communication Communication: No difficulties   Cognition Arousal/Alertness: Awake/alert Behavior During Therapy: WFL for tasks assessed/performed Overall Cognitive Status: No family/caregiver present to determine baseline cognitive functioning                                       General Comments  stomach pain    Exercises     Shoulder Instructions      Home Living Family/patient expects to be discharged to:: Private residence Living Arrangements: Alone Available Help at Discharge: Family;Available PRN/intermittently Type of Home: House Home Access: Stairs to enter [employer] of Steps: 3 Entrance Stairs-Rails: Left Home Layout: One level     Bathroom Shower/Tub: Tub/shower unit;Curtain   Bathroom Toilet: Standard Bathroom Accessibility: Yes   Home Equipment: Conservation officer, nature (2 wheels);Cane - single point;Shower seat;BSC/3in1;Wheelchair - manual   Additional Comments: has been taking basin baths      Prior Functioning/Environment Prior Level of Function : Independent/Modified Independent             Mobility Comments: Uses cane for ambulation ADLs Comments: sister assists with bathing and lives across the street, doesn't drive        OT Problem List: Decreased strength;Impaired balance (sitting and/or standing);Decreased activity tolerance;Decreased safety awareness;Decreased knowledge of use of DME or AE;Cardiopulmonary status limiting activity;Pain      OT Treatment/Interventions: Self-care/ADL training;Therapeutic exercise;Energy conservation;DME and/or AE instruction;Therapeutic activities;Patient/family education;Balance training    OT Goals(Current goals can be found in the care plan section) Acute Rehab OT Goals Patient Stated Goal: to be independent OT Goal  Formulation: With patient Time For Goal Achievement: 04/08/22 Potential to Achieve Goals: Good  OT Frequency: Min 2X/week    Co-evaluation              AM-PAC OT "6 Clicks" Daily Activity     Outcome Measure Help from another person eating meals?: None Help from another person taking care of personal grooming?: A Little Help from another person toileting, which includes using toliet, bedpan, or urinal?: A Little Help from another person bathing (including washing, rinsing, drying)?: A Little Help from another person to put on and taking off regular upper body clothing?: A Little Help from another person to put on and taking off regular lower body clothing?: A Little 6 Click Score: 19   End of Session Equipment Utilized During Treatment: Gait belt;Rolling walker (2 wheels) Nurse Communication: Mobility status  Activity Tolerance: Patient tolerated treatment well Patient left: in bed;with call bell/phone within reach;with chair alarm set  OT Visit Diagnosis: Unsteadiness on feet (R26.81);Muscle weakness (generalized) (M62.81);History of falling (Z91.81);Pain Pain - part of body:  (stomach)                Time: 7341-9379 OT Time Calculation (min): 33 min Charges:  OT General Charges $OT Visit: 1 Visit OT Evaluation $OT Eval Moderate Complexity: 1 Mod OT Treatments $Self Care/Home Management : 8-22 mins  Maurie Boettcher, OT/L   Acute OT Clinical Specialist [place] Pager [phone] Office [phone]   [facility] 03/25/2022,  3:02 PM

## 2022-03-25 NOTE — Hospital Course (Addendum)
Dana Coleman is a 81 y.o.female with a history of HFrEF, HTN, HLD, CAD, T2DM, GERD, history of breast cancer and cervical cancer who was admitted to the [facility] Teaching Service at [facility] for chest pain. Her hospital course is detailed below:  Chest pain Presented with chest pain that improved with nitroglycerin.  EKG and troponins normal and BNP normal for patient.  BMP unremarkable, with patient at baseline creatinine value and normal electrolytes.  Clinical physical exam ruled out heart failure exacerbation. Cardiology (Dr. Terrence Dupont) was consulted.  Nuclear stress testing performed and showed LVEF 25%, large fixed defect involving apical and inferior myocardium consistent with old infarction.  Cardiac medications were changed to Toprol-XL 12.5 mg daily, Entresto 24-26 twice daily, spironolactone 12.5 mg daily, Imdur '30mg'$  daily. Hydralazine was discontinued. These changes were made due to patient experiencing dizziness and hypotension during hospitalization. She was discharged with [facility] PT/OT/RN and advised to follow-up with   Abdominal pain Patient continued to have generalized abdominal pain consistent with her prior abdominal pain which she is experienced since hospitalization in August in which she had PEG tube placed.  She remained stable during hospitalization and continues to eat and drink appropriately.  Additionally, abdominal pain has been unchanged and relieved with Tylenol.  Did not opt to pursue further GI work-up given clinical history.  Other chronic conditions were medically managed with home medications and formulary alternatives as necessary (insomnia, A-fib, CKD 3A, HFrEF, GERD, T2DM, CAD, HTN, HLD)  PCP Follow-up Recommendations: Ensure f/u w/ Cardiologist Dr. Terrence Dupont in 1-2 weeks.

## 2022-03-25 NOTE — Progress Notes (Signed)
FPTS Brief Progress Note  S:sleeping, no chest pain   O: BP 103/61 (BP Location: Right Arm)   Pulse 68   Temp 97.6 F (36.4 C) (Oral)   Resp 17   Ht '5\' 2"'$  (1.575 m)   Wt 52.6 kg   SpO2 97%   BMI 21.20 kg/m     A/P: - Will add prn sublingual nitro if needed - Orders reviewed. Labs for AM ordered, which was adjusted as needed.   Gladys Damme, MD 03/25/2022, 4:04 AM PGY-3, [facility] Night Resident  Please page [phone] with questions.

## 2022-03-25 NOTE — Telephone Encounter (Signed)
CSW consulted to speak with pt regarding Music therapist.  CSW attempted to call pt to discuss- went straight to VM- left message requesting return call  Jorge Ny, [facility] Desk#: [phone] Cell#: [phone]

## 2022-03-25 NOTE — Progress Notes (Signed)
ANTICOAGULATION CONSULT NOTE   Pharmacy Consult for Heparin Indication: atrial fibrillation (Eliquis on hold)  No Known Allergies  Patient Measurements: Height: '5\' 2"'$  (157.5 cm) Weight: 54.4 kg (119 lb 14.9 oz) IBW/kg (Calculated) : 50.1 Heparin Dosing Weight: 53 kg  Vital Signs: Temp: 98.4 F (36.9 C) (06/13 0421) Temp Source: Oral (06/13 0421) BP: 109/90 (06/13 0421) Pulse Rate: 86 (06/13 0421)  Labs: Recent Labs    03/23/22 1433 03/23/22 1641 03/23/22 1841 03/24/22 0502 03/25/22 0551  HGB 12.1  --   --   --   --   HCT 36.6  --   --   --   --   PLT 131*  --   --   --   --   APTT  --   --   --   --  56*  HEPARINUNFRC  --   --   --   --  >1.10*  CREATININE 1.12*  --   --  1.22*  --   TROPONINIHS  --  21* 20*  --   --      Estimated Creatinine Clearance: 29.1 mL/min (A) (by C-G formula based on SCr of 1.22 mg/dL (H)).   Medical History: Past Medical History:  Diagnosis Date   Allergy    occ uses OTC allergy meds    Arthritis    fingers   Breast cancer ([place]) 04/2008   Right breast   Cataracts, bilateral    removed bilat    Cervical cancer ([place])    When the patient was in her 18s   Depression    Diabetes mellitus without complication ([place])    Empyema ([place])    Esophageal perforation 05/05/2021   Failure to thrive in adult 07/22/2021   Wt Readings from Last 3 Encounters: 06/26/21 65.8 kg 06/17/21 64.8 kg 04/26/21 58.1 kg     Gastrostomy tube in place [facility]) 08/21/2021   Heart attack ([place]) 2004   History of cervical cancer    S/p radiation and surgical removal in 30's.  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 Truddie Coco   PEG (percutaneous endoscopic gastrostomy) status ([place]) 07/23/2021   Personal  history of radiation therapy 2009   Pressure injury of skin 05/05/2021   Protein-calorie malnutrition, severe 05/15/2021   Pyelonephritis 11/06/2021   Respiratory failure ([place]) 05/04/2021   S/P thoracotomy    UTI (urinary tract infection) 11/05/2021    Medications:  See electronic med rec  Assessment: 81 y.o. F presents with CP. Pt on Eliquis '5mg'$  po BID for afib - last dose 6/12 at 0945. Currently holding Eliquis and to start heparin gtt for r/o MI as pt may need cath. H/H ok on admission. Plt slightly low at 131.   aPTT 56 is below goal on 650 units/hr; no infusion issues or overt bleeding reported from RN    Goal of Therapy:  aPTT 66-102 seconds; heparin level 0.3-0.7 units/ml Monitor platelets by anticoagulation protocol: Yes   Plan:  Increase heparin gtt to 800 units/hr Will f/u aPTT and heparin level 8 hours post start Daily heparin level, aPTT, and CBC  Georga Bora, PharmD Clinical Pharmacist 03/25/2022 6:40 AM Please check AMION for all [place] numbers

## 2022-03-25 NOTE — Progress Notes (Signed)
Subjective:  Patient denies any chest pain or shortness of breath seen in nuclear medicine department tolerated stress portion of Lexiscan Myoview.  Complains of vague abdominal epigastric pain.  Objective:  Vital Signs in the last 24 hours: Temp:  [97.6 F (36.4 C)-98.4 F (36.9 C)] 98.4 F (36.9 C) (06/13 0421) Pulse Rate:  [68-86] 86 (06/13 0421) Resp:  [12-20] 18 (06/13 0421) BP: (70-131)/(49-90) 126/67 (06/13 0949) SpO2:  [97 %-99 %] 99 % (06/13 0421) Weight:  [52.6 kg-54.4 kg] 54.4 kg (06/13 0421)  Intake/Output from previous day: 06/12 0701 - 06/13 0700 In: 405.5 [P.O.:360; I.V.:45.5] Out: 550 [Urine:550] Intake/Output from this shift: No intake/output data recorded.  Physical Exam: Exam unchanged  Lab Results: Recent Labs    03/23/22 1433 03/25/22 0551  WBC 6.0 4.9  HGB 12.1 10.7*  PLT 131* 113*   Recent Labs    03/23/22 1433 03/24/22 0502  NA 138 141  K 4.2 4.1  CL 103 106  CO2 22 25  GLUCOSE 203* 111*  BUN 19 19  CREATININE 1.12* 1.22*   No results for input(s): "TROPONINI" in the last 72 hours.  Invalid input(s): "CK", "MB" Hepatic Function Panel No results for input(s): "PROT", "ALBUMIN", "AST", "ALT", "ALKPHOS", "BILITOT", "BILIDIR", "IBILI" in the last 72 hours. No results for input(s): "CHOL" in the last 72 hours. No results for input(s): "PROTIME" in the last 72 hours.  Imaging: Imaging results have been reviewed and DG Chest 2 View  Result Date: 03/23/2022 CLINICAL DATA:  Short of breath.  Chest pain. EXAM: CHEST - 2 VIEW COMPARISON:  12/29/2021. FINDINGS: Cardiac silhouette is mildly enlarged. No mediastinal or hilar masses. No evidence of adenopathy. Clear lungs.  No pleural effusion or pneumothorax. Previous thyroid surgery, stable. Skeletal structures are intact. IMPRESSION: No active cardiopulmonary disease. Electronically Signed   By: Lajean Manes M.D.   On: 03/23/2022 15:10    Cardiac Studies:  Assessment/Plan:  New onset angina  MI ruled out Coronary artery disease history of inferior wall MI in the past status post PCI to RCA and perioperative non-Q MI in the past Ischemic cardiomyopathy Compensated systolic congestive heart failure Hypertension Diabetes mellitus Hyperlipidemia History of paroxysmal atrial fibrillation on chronic anticoagulation History of bilateral PE Abdominal pain work-up per primary team Plan Continue present management Check Lexiscan Myoview results May need GI evaluation  LOS: 0 days    Charolette Forward 03/25/2022, 10:03 AM

## 2022-03-25 NOTE — Progress Notes (Incomplete)
[place] for heparin (holding PTA Eliquis) Indication: atrial fibrillation  No Known Allergies  Patient Measurements: Height: '5\' 2"'$  (157.5 cm) Weight: 54.4 kg (119 lb 14.9 oz) IBW/kg (Calculated) : 50.1 Heparin Dosing Weight: 53 kg  Vital Signs: Temp: 98.4 F (36.9 C) (06/13 0421) Temp Source: Oral (06/13 0421) BP: 109/90 (06/13 0421) Pulse Rate: 86 (06/13 0421)  Labs: Recent Labs    03/23/22 1433 03/23/22 1641 03/23/22 1841 03/24/22 0502 03/25/22 0551  HGB 12.1  --   --   --   --   HCT 36.6  --   --   --   --   PLT 131*  --   --   --   --   APTT  --   --   --   --  56*  HEPARINUNFRC  --   --   --   --  >1.10*  CREATININE 1.12*  --   --  1.22*  --   TROPONINIHS  --  21* 20*  --   --     Estimated Creatinine Clearance: 29.1 mL/min (A) (by C-G formula based on SCr of 1.22 mg/dL (H)).  Medical History: Past Medical History:  Diagnosis Date   Allergy    occ uses OTC allergy meds    Arthritis    fingers   Breast cancer ([place]) 04/2008   Right breast   Cataracts, bilateral    removed bilat    Cervical cancer ([place])    When the patient was in her 33s   Depression    Diabetes mellitus without complication ([employer])    Empyema ([place])    Esophageal perforation 05/05/2021   Failure to thrive in adult 07/22/2021   Wt Readings from Last 3 Encounters: 06/26/21 65.8 kg 06/17/21 64.8 kg 04/26/21 58.1 kg     Gastrostomy tube in place [facility]) 08/21/2021   Heart attack ([place]) 2004   History of cervical cancer    S/p radiation and surgical removal in 30's.  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 Truddie Coco   PEG (percutaneous endoscopic gastrostomy) status ([place]) 07/23/2021   Personal  history of radiation therapy 2009   Pressure injury of skin 05/05/2021   Protein-calorie malnutrition, severe 05/15/2021   Pyelonephritis 11/06/2021   Respiratory failure ([place]) 05/04/2021   S/P thoracotomy    UTI (urinary tract infection) 11/05/2021    Medications:  Infusions:   heparin 800 Units/hr (03/25/22 0657)    Assessment: 81 yo F presents with CP with PMH of Afib on PTA Eliquis (last dose 06/12 at 0945). Pharmacy consulted for heparin dosing.  CBC stable - Hgb ***, Plt ***.     Goal of Therapy:  Heparin level 0.3-0.7 units/ml Monitor platelets by anticoagulation protocol: Yes   Plan:  Give heparin bolus *** units x1 Start heparin infusion *** units/hr *** hour heparin level Daily CBC, heparin level Monitor for s/sx of bleeding  Laurey Arrow, PharmD PGY1 Pharmacy Resident 03/25/2022  7:05 AM  Please check [email or web address] for unit-specific pharmacy phone numbers.

## 2022-03-25 NOTE — Progress Notes (Addendum)
Patient was orthostatic while standing and fell back onto the bed, see chart for further details.  I went to bedside to see patient and her sister. Plan had been to discharge today; however, they do not feel comfortable with discharge given orthostatic event and that patient lives alone.  BP has remained fairly soft throughout admission.  Patient was on hydralazine previously which has already been discontinued.  Given continued orthostasis, I have reduced Entresto to 49-51 mg twice daily.  I have canceled discharge order, will continue to monitor BP and plan for potential discharge tomorrow if BP and orthostasis improved.

## 2022-03-25 NOTE — Progress Notes (Signed)
Mobility Specialist Progress Note    03/25/22 1618  Mobility  Activity Stood at bedside  Level of Assistance Minimal assist, patient does 75% or more  Assistive Device Front wheel walker  Activity Response Tolerated poorly  $Mobility charge 1 Mobility   Pt received in bed and agreeable to getting orthostatics. Upon sitting up pt c/o some dizziness. MinA to stand. Monitor did not read BP. Pt stood for ~5 minutes while attempting to get BP. Shortly before attempting 3rd BP, pts legs started to shake and she fell back onto the bed. RN notified. Pt stated she did not know what just happened. Once in bed, 99/62 BP, 71 HR, 98% SpO2.    Orthostatic BPs Supine 89/55, HR 80  Sitting 88/54, HR 72  Standing HR 80    Insurance account manager

## 2022-03-25 NOTE — TOC Initial Note (Signed)
Transition of Care [facility]) - Initial/Assessment Note    Patient Details  Name: Dana Coleman MRN: [id number] Date of Birth: 02-14-1941  Transition of Care [facility]) CM/SW Contact:    Bethena Roys, RN Phone Number: 03/25/2022, 4:19 PM  Clinical Narrative:  Patient presented with chest pain. Plan will be to return home. Patient is currently active with [facility] for RN Services- Case Manager asked for PT/OT orders to be added as well. [facility] will service for the above disciplines. Case Manager did speak with the CSW for HF Team and the patient will be followed post hospitalization for Paramedicine Team. No further needs identified at this time.   Expected Discharge Plan: [place] Barriers to Discharge: No Barriers Identified   Patient Goals and CMS Choice Patient states their goals for this hospitalization and ongoing recovery are:: to return home.   Choice offered to / list presented to : Patient  Expected Discharge Plan and Services Expected Discharge Plan: [place] In-house Referral: NA Discharge Planning Services: CM Consult Post Acute Care Choice: Resumption of Svcs/PTA Provider, Home Health   Expected Discharge Date: 03/25/22               DME Arranged: N/A DME Agency: NA       HH Arranged: RN, Disease Management, PT, OT HH Agency: [place] ([facility]) Date HH Agency Contacted: 03/25/22 Time [place]: 1618 Representative spoke with at [place]: [place] Arrangements/Services   Lives with:: Self   Do you feel safe going back to the place where you live?: Yes      Need for Family Participation in Patient Care: No (Comment) Care giver support system in place?: No (comment) Current home services: Home RN Criminal Activity/Legal Involvement Pertinent to Current Situation/Hospitalization: No - Comment as needed  Permission Sought/Granted Permission sought to share information with  : Facility Sport and exercise psychologist, Case Optician, dispensing granted to share information with : Yes, Verbal Permission Granted     Permission granted to share info w AGENCY: [facility]        Emotional Assessment Appearance:: Appears stated age Attitude/Demeanor/Rapport: Engaged Affect (typically observed): Appropriate Orientation: : Oriented to Situation, Oriented to  Time, Oriented to Place, Oriented to Self Alcohol / Substance Use: Not Applicable Psych Involvement: No (comment)  Admission diagnosis:  Chest pain [R07.9] Chest pain, unspecified type [R07.9] Patient Active Problem List   Diagnosis Date Noted   Stage 3a chronic kidney disease (CKD) ([place]) 03/24/2022   Paroxysmal atrial fibrillation ([place]) 03/24/2022   Insomnia 03/24/2022   Chest pain 03/23/2022   Rib pain on left side 01/06/2022   Acute exacerbation of CHF (congestive heart failure) ([place]) 12/29/2021   Hematuria 11/06/2021   Abdominal pain    Dyspnea on exertion    Impaired functional mobility, balance, gait, and endurance 05/25/2021   NSTEMI (non-ST elevated myocardial infarction) ([place]) 05/23/2021   HFrEF (heart failure with reduced ejection fraction) ([place]) 05/23/2021   Acute pulmonary embolism ([place])    Acute respiratory failure with hypoxemia ([place])    Acute pulmonary edema ([place])    Acute respiratory failure with hypoxia ([place])    Acute renal failure ([place])    GERD (gastroesophageal reflux disease) 12/19/2020   Breast lump 12/07/2018   Type 2 diabetes mellitus with other specified complication ([place]) 04/11/1600   Debility 06/30/2011   History of thyroid nodule 01/16/2011   Hyperlipidemia    Hypertension    CAD (coronary artery disease), native  coronary artery    PCP:  Concepcion Living, MD Pharmacy:   [id number] [place] [address] [place]Phone: [phone]: [phone] [facility] [address]Phone: [phone]: [phone] Readmission Risk Interventions     No data to display

## 2022-03-26 ENCOUNTER — Other Ambulatory Visit (HOSPITAL_COMMUNITY): Payer: Self-pay

## 2022-03-26 DIAGNOSIS — I5022 Chronic systolic (congestive) heart failure: Secondary | ICD-10-CM | POA: Diagnosis present

## 2022-03-26 DIAGNOSIS — E1169 Type 2 diabetes mellitus with other specified complication: Secondary | ICD-10-CM | POA: Diagnosis present

## 2022-03-26 DIAGNOSIS — R0789 Other chest pain: Secondary | ICD-10-CM | POA: Diagnosis not present

## 2022-03-26 DIAGNOSIS — Z8541 Personal history of malignant neoplasm of cervix uteri: Secondary | ICD-10-CM | POA: Diagnosis not present

## 2022-03-26 DIAGNOSIS — I25118 Atherosclerotic heart disease of native coronary artery with other forms of angina pectoris: Secondary | ICD-10-CM | POA: Diagnosis not present

## 2022-03-26 DIAGNOSIS — Z833 Family history of diabetes mellitus: Secondary | ICD-10-CM | POA: Diagnosis not present

## 2022-03-26 DIAGNOSIS — I130 Hypertensive heart and chronic kidney disease with heart failure and stage 1 through stage 4 chronic kidney disease, or unspecified chronic kidney disease: Secondary | ICD-10-CM | POA: Diagnosis present

## 2022-03-26 DIAGNOSIS — E1122 Type 2 diabetes mellitus with diabetic chronic kidney disease: Secondary | ICD-10-CM | POA: Diagnosis present

## 2022-03-26 DIAGNOSIS — I255 Ischemic cardiomyopathy: Secondary | ICD-10-CM | POA: Diagnosis present

## 2022-03-26 DIAGNOSIS — I208 Other forms of angina pectoris: Secondary | ICD-10-CM | POA: Diagnosis not present

## 2022-03-26 DIAGNOSIS — E785 Hyperlipidemia, unspecified: Secondary | ICD-10-CM | POA: Diagnosis present

## 2022-03-26 DIAGNOSIS — Z79899 Other long term (current) drug therapy: Secondary | ICD-10-CM | POA: Diagnosis not present

## 2022-03-26 DIAGNOSIS — I480 Paroxysmal atrial fibrillation: Secondary | ICD-10-CM | POA: Diagnosis present

## 2022-03-26 DIAGNOSIS — N1831 Chronic kidney disease, stage 3a: Secondary | ICD-10-CM | POA: Diagnosis present

## 2022-03-26 DIAGNOSIS — I509 Heart failure, unspecified: Secondary | ICD-10-CM | POA: Diagnosis not present

## 2022-03-26 DIAGNOSIS — I5020 Unspecified systolic (congestive) heart failure: Secondary | ICD-10-CM | POA: Diagnosis not present

## 2022-03-26 DIAGNOSIS — Z8249 Family history of ischemic heart disease and other diseases of the circulatory system: Secondary | ICD-10-CM | POA: Diagnosis not present

## 2022-03-26 DIAGNOSIS — Z86711 Personal history of pulmonary embolism: Secondary | ICD-10-CM | POA: Diagnosis not present

## 2022-03-26 DIAGNOSIS — K219 Gastro-esophageal reflux disease without esophagitis: Secondary | ICD-10-CM | POA: Diagnosis present

## 2022-03-26 DIAGNOSIS — R079 Chest pain, unspecified: Secondary | ICD-10-CM | POA: Diagnosis not present

## 2022-03-26 DIAGNOSIS — Z7982 Long term (current) use of aspirin: Secondary | ICD-10-CM | POA: Diagnosis not present

## 2022-03-26 DIAGNOSIS — I951 Orthostatic hypotension: Secondary | ICD-10-CM | POA: Diagnosis not present

## 2022-03-26 DIAGNOSIS — Z794 Long term (current) use of insulin: Secondary | ICD-10-CM | POA: Diagnosis not present

## 2022-03-26 DIAGNOSIS — G4700 Insomnia, unspecified: Secondary | ICD-10-CM | POA: Diagnosis present

## 2022-03-26 DIAGNOSIS — Z7901 Long term (current) use of anticoagulants: Secondary | ICD-10-CM | POA: Diagnosis not present

## 2022-03-26 DIAGNOSIS — Z853 Personal history of malignant neoplasm of breast: Secondary | ICD-10-CM | POA: Diagnosis not present

## 2022-03-26 DIAGNOSIS — E119 Type 2 diabetes mellitus without complications: Secondary | ICD-10-CM | POA: Diagnosis not present

## 2022-03-26 DIAGNOSIS — I252 Old myocardial infarction: Secondary | ICD-10-CM | POA: Diagnosis not present

## 2022-03-26 DIAGNOSIS — I10 Essential (primary) hypertension: Secondary | ICD-10-CM | POA: Diagnosis not present

## 2022-03-26 DIAGNOSIS — Z955 Presence of coronary angioplasty implant and graft: Secondary | ICD-10-CM | POA: Diagnosis not present

## 2022-03-26 DIAGNOSIS — Z823 Family history of stroke: Secondary | ICD-10-CM | POA: Diagnosis not present

## 2022-03-26 LAB — CBC
HCT: 31.5 % — ABNORMAL LOW (ref 36.0–46.0)
Hemoglobin: 10.3 g/dL — ABNORMAL LOW (ref 12.0–15.0)
MCH: 29.3 pg (ref 26.0–34.0)
MCHC: 32.7 g/dL (ref 30.0–36.0)
MCV: 89.5 fL (ref 80.0–100.0)
Platelets: 127 10*3/uL — ABNORMAL LOW (ref 150–400)
RBC: 3.52 MIL/uL — ABNORMAL LOW (ref 3.87–5.11)
RDW: 17.7 % — ABNORMAL HIGH (ref 11.5–15.5)
WBC: 5.4 10*3/uL (ref 4.0–10.5)
nRBC: 0 % (ref 0.0–0.2)

## 2022-03-26 LAB — BASIC METABOLIC PANEL
Anion gap: 6 (ref 5–15)
BUN: 26 mg/dL — ABNORMAL HIGH (ref 8–23)
CO2: 25 mmol/L (ref 22–32)
Calcium: 8.8 mg/dL — ABNORMAL LOW (ref 8.9–10.3)
Chloride: 109 mmol/L (ref 98–111)
Creatinine, Ser: 1.15 mg/dL — ABNORMAL HIGH (ref 0.44–1.00)
GFR, Estimated: 48 mL/min — ABNORMAL LOW (ref >60)
Glucose, Bld: 130 mg/dL — ABNORMAL HIGH (ref 70–99)
Potassium: 4.3 mmol/L (ref 3.5–5.1)
Sodium: 140 mmol/L (ref 135–145)

## 2022-03-26 LAB — GLUCOSE, CAPILLARY
Glucose-Capillary: 135 mg/dL — ABNORMAL HIGH (ref 70–99)
Glucose-Capillary: 194 mg/dL — ABNORMAL HIGH (ref 70–99)
Glucose-Capillary: 180 mg/dL — ABNORMAL HIGH (ref 70–99)
Glucose-Capillary: 205 mg/dL — ABNORMAL HIGH (ref 70–99)

## 2022-03-26 MED ORDER — APIXABAN 2.5 MG PO TABS
2.5000 mg | ORAL_TABLET | Freq: Two times a day (BID) | ORAL | 0 refills | Status: DC
  Filled 2022-03-26: qty 60, 30d supply, fill #0

## 2022-03-26 MED ORDER — SACUBITRIL-VALSARTAN 49-51 MG PO TABS
1.0000 | ORAL_TABLET | Freq: Two times a day (BID) | ORAL | 0 refills | Status: DC
  Filled 2022-03-26: qty 60, 30d supply, fill #0

## 2022-03-26 NOTE — Progress Notes (Signed)
FPTS Brief Progress Note  S: sleeping   O: BP (!) 94/48 (BP Location: Right Arm)   Pulse 79   Temp 98.8 F (37.1 C) (Oral)   Resp 17   Ht '5\' 2"'$  (1.575 m)   Wt 54.4 kg   SpO2 99%   BMI 21.94 kg/m     A/P: -Suspect low BP due to sleeping, will recheck - Orders reviewed. Labs for AM ordered, which was adjusted as needed.   Gladys Damme, MD 03/26/2022, 1:36 AM PGY-3, [facility] Night Resident  Please page [phone] with questions.

## 2022-03-26 NOTE — Progress Notes (Signed)
Daily Progress Note Intern Pager: [phone]  Patient name: Dana Coleman Medical record number: [id number] Date of birth: 1941-02-13 Age: 81 y.o. Gender: female  Primary Care Provider: Concepcion Living, MD Consultants: Cardiology Code Status: Full  Pt Overview and Major Events to Date:  6/11: admitted 6/13: Nuclear stress testing  Assessment and Plan: [name] is a 81 y.o. female who presented with chest pain but work-up unremarkable for ACS, CHF, pneumonia, PE. Pertinent PMH/PSH includes HFrEF, HTN, HLD, CAD, T2DM, GERD, history of breast cancer, history of cervical cancer.   * Chest pain No further cardiac-like events overnight. Cardiology (Dr. Terrence Dupont) discussed with patient and opted to proceed with nuclear stress testing. I again discussed abdominal discomfort with patient and believe it to be related to musculoskeletal discomfort and scarring from prior procedure. The pain has remained consistent and relieves with tylenol and has continued to unaffect her eating/drinking. -Cardiology signed off -PT/OT eval and treat  Paroxysmal atrial fibrillation (HCC) HR 60-80's overnight. Home meds apixiban 5 mg BID, ASA 81 mg daily, Toprol-XL 12.5 mg daily. -Continue home med  Dyspnea on exertion Yesterday became weak with working with physical therapy.  Believed to be related to symptomatic hypotension.  Adjusted blood pressure medications.  Patient continues to feel unstable with physical therapy.  Patient has home health therapy measures which will continue to benefit her. -Plan for discharge tomorrow  Type 2 diabetes mellitus with other specified complication ([place]) CBGs well controlled currently ranging from 111-143.  -Very sensitive SSI with CBG during meals, qhs, and ac -Holding home meds of lantus 6u daily  Hypertension BP ranged from 90s-100s/60-90s. Blood pressure medications have been adjusted given hypotension. -Imdur 30 mg daily -Toprol-XL 12.5 mg  daily -Spironolactone 12.5 mg daily -Entresto 49-51 twice daily -Hydralazine changed to '10mg'$  TID per cardiology  FEN/GI: Heart healthy/carb modified PPx: Eliquis Dispo:Home with home health tomorrow.   Subjective:  Patient states she is doing well today and recalls feeling weak yesterday.  She again discussed her abdominal discomfort which has been persistent unchanged for the last year.  She is amenable to going home today.  Objective: Temp:  [98 F (36.7 C)-98.8 F (37.1 C)] 98 F (36.7 C) (06/14 1500) Pulse Rate:  [71-79] 74 (06/14 1500) Resp:  [18] 18 (06/14 1500) BP: (94-109)/(48-66) 109/66 (06/14 1500) SpO2:  [97 %-100 %] 100 % (06/14 0430) Weight:  [53.9 kg] 53.9 kg (06/14 0430) Physical Exam: General: Awake, alert, NAD Cardiovascular: RRR, no murmurs auscultated Respiratory: CTA B, normal WOB Abdomen: Soft, nondistended, normoactive bowel sounds, scar from PEG tube, pain with palpation of anterior portion of left ribs 7 and PEG tube scar, otherwise unremarkable abdominal exam  Laboratory: Most recent CBC Lab Results  Component Value Date   WBC 5.4 03/26/2022   HGB 10.3 (L) 03/26/2022   HCT 31.5 (L) 03/26/2022   MCV 89.5 03/26/2022   PLT 127 (L) 03/26/2022   Most recent BMP    Latest Ref Rng & Units 03/26/2022    1:07 AM  BMP  Glucose 70 - 99 mg/dL 130   BUN 8 - 23 mg/dL 26   Creatinine 0.44 - 1.00 mg/dL 1.15   Sodium 135 - 145 mmol/L 140   Potassium 3.5 - 5.1 mmol/L 4.3   Chloride 98 - 111 mmol/L 109   CO2 22 - 32 mmol/L 25   Calcium 8.9 - 10.3 mg/dL 8.8    CBG (last 3)  Recent Labs    03/26/22 0751  03/26/22 1147 03/26/22 1631  GLUCAP 135* 194* 180*     Imaging/Diagnostic Tests: No recent imaging results Wells Guiles, DO 03/26/2022, 4:46 PM  PGY-1, [place] Intern pager: [phone], text pages welcome Secure chat group [place]

## 2022-03-26 NOTE — Progress Notes (Signed)
Physical Therapy Treatment Patient Details Name: Dana Coleman MRN: [id number] DOB: 1940/11/07 Today's Date: 03/26/2022   History of Present Illness Pt is an 81 y.o. female admitted 03/23/22 with chest pain, SOB. Workup for stable angina. Course complicated by symptomatic orthostatic hypotension. PMH includes PE, HTN, DM, afib, CAD, breast CA, HF, MI, esophogeal perforation.   PT Comments    Pt slowly progressing with mobility. Today's session focused on mobility progression, pt still with c/o lightheadedness and "feeling weak" with short ambulation distance with RW (see BP values below). Educ on strategies for safe mobility and fall risk reduction with soft BP; pt reports able to stay at sister's for increased assist at d/c if needed. Will continue to follow acutely to address established goals.  Orthostatic BPs Supine 109/61  Sitting 102/66  Standing 90/68  Standing after 2 min 91/65  Post-ambulation 14' (c/o dizzy requiring return to supine) 117/71  Standing 99/64  Post-ambulation 14' (c/o dizzy, but able to maintain standing) 97/69       Recommendations for follow up therapy are one component of a multi-disciplinary discharge planning process, led by the attending physician.  Recommendations may be updated based on patient status, additional functional criteria and insurance authorization.  Follow Up Recommendations  Home health PT     Assistance Recommended at Discharge Intermittent Supervision/Assistance  Patient can return home with the following A little help with walking and/or transfers;A little help with bathing/dressing/bathroom;Assistance with cooking/housework;Assist for transportation   Equipment Recommendations  None recommended by PT    Recommendations for Other Services       Precautions / Restrictions Precautions Precautions: Fall;Other (comment) Precaution Comments: Watch BP (orthostatic hypotension with syncopal episode 6/13) Restrictions Weight  Bearing Restrictions: No     Mobility  Bed Mobility Overal bed mobility: Independent             General bed mobility comments: multiple supine<>sit indep, HOB slightly elevated    Transfers Overall transfer level: Needs assistance Equipment used: Rolling walker (2 wheels) Transfers: Sit to/from Stand Sit to Stand: Supervision           General transfer comment: multiple sit<>stand from EOB and recliner to RW, supervision due to recent symptomatic soft BP    Ambulation/Gait Ambulation/Gait assistance: Min guard, Supervision Gait Distance (Feet): 14 Feet (+ 14')     Gait velocity: Decreased   Pre-gait activities: bout of ~30-sec marching in place before pt needing to have seated rest due to c/o fatigue and "feeling weak" General Gait Details: slow, steady ambulation with RW and min guard due to soft BP, pt c/o dizziness therefore cued to return to sitting; additional ambulation in room with less intense symptoms, though pt still c/o dizziness   Stairs             Wheelchair Mobility    Modified Rankin (Stroke Patients Only)       Balance Overall balance assessment: Needs assistance Sitting-balance support: No upper extremity supported, Feet supported Sitting balance-Leahy Scale: Good     Standing balance support: No upper extremity supported, During functional activity Standing balance-Leahy Scale: Fair Standing balance comment: can static stand without UE support                            Cognition Arousal/Alertness: Awake/alert Behavior During Therapy: WFL for tasks assessed/performed Overall Cognitive Status: Within Functional Limits for tasks assessed  General Comments: WFL for simple tasks, not formally assessed        Exercises      General Comments General comments (skin integrity, edema, etc.): pt still with symptomatic soft BP - educ on signs/symptoms of orthostatic  hypotension, strategies to overcome/reduce risk for falls      Pertinent Vitals/Pain Pain Assessment Pain Assessment: No/denies pain Pain Intervention(s): Monitored during session    Home Living                          Prior Function            PT Goals (current goals can now be found in the care plan section) Progress towards PT goals: Progressing toward goals    Frequency    Min 3X/week      PT Plan Current plan remains appropriate    Co-evaluation              AM-PAC PT "6 Clicks" Mobility   Outcome Measure  Help needed turning from your back to your side while in a flat bed without using bedrails?: None Help needed moving from lying on your back to sitting on the side of a flat bed without using bedrails?: None Help needed moving to and from a bed to a chair (including a wheelchair)?: A Little Help needed standing up from a chair using your arms (e.g., wheelchair or bedside chair)?: A Little Help needed to walk in hospital room?: A Little Help needed climbing 3-5 steps with a railing? : A Little 6 Click Score: 20    End of Session Equipment Utilized During Treatment: Gait belt Activity Tolerance: Patient tolerated treatment well;Treatment limited secondary to medical complications (Comment) Patient left: in chair;with call bell/phone within reach Nurse Communication: Mobility status PT Visit Diagnosis: Unsteadiness on feet (R26.81);Muscle weakness (generalized) (M62.81);Difficulty in walking, not elsewhere classified (R26.2)     Time: 4628-6381 PT Time Calculation (min) (ACUTE ONLY): 22 min  Charges:  $Therapeutic Activity: 8-22 mins                     Mabeline Caras, PT, DPT Acute Rehabilitation Services  Pager [phone] Office [phone]  Derry Lory 03/26/2022, 1:32 PM

## 2022-03-26 NOTE — Discharge Instructions (Addendum)
Dear Dana Coleman,   Thank you for letting us participate in your care! In this section, you will find a brief summary of why you were admitted to the hospital, what happened during your admission, your diagnosis/diagnoses, and recommended follow up.  You were admitted because you were experiencing chest pain  Your testing revealed an abnormal stress test You were seen by cardiology who would like you to follow up with them in 1-2 weeks Your blood pressure medications were adjusted due to lower end blood pressures and dizziness  POST-HOSPITAL & CARE INSTRUCTIONS Please see medications section of this packet for any medication changes (STOP hydralazine. DOSE CHANGED for Entresto. Use Lasix only AS NEEDED) Schedule an appointment with Dr Terrence Dupont in 1-2 weeks  DOCTOR'S APPOINTMENTS & FOLLOW UP Future Appointments  Date Time Provider [place]  04/03/2022  8:50 AM Concepcion Living, MD [facility]  04/30/2022  1:30 PM [facility] PA/NP [facility] None  06/02/2022  1:00 PM [place] [facility] OP 1 [facility]  06/02/2022  2:20 PM Bensimhon, Shaune Pascal, MD [facility] None    Thank you for choosing [facility]! Take care and be well!  [facility]  [place], [place] [address] [phone]

## 2022-03-26 NOTE — Assessment & Plan Note (Addendum)
Yesterday became weak with working with physical therapy.  Believed to be related to symptomatic hypotension.  Adjusted blood pressure medications.  Patient continues to feel unstable with physical therapy.  Patient has home health therapy measures which will continue to benefit her. -Plan for discharge tomorrow

## 2022-03-26 NOTE — Progress Notes (Signed)
Subjective:  Patient denies any chest pain or shortness of breath.  States overall feeling weak.  Spoke with her and her daughter at length yesterday regarding abnormal nuclear stress test wanted to be treated medically for now until GI issues are resolved.  Continues to have epigastric pain.  Patient also was noted to be orthostatic and her medications are being adjusted.  Objective:  Vital Signs in the last 24 hours: Temp:  [98 F (36.7 C)-98.8 F (37.1 C)] 98 F (36.7 C) (06/14 0430) Pulse Rate:  [63-79] 71 (06/14 0430) Resp:  [17] 17 (06/13 1117) BP: (70-131)/(48-98) 103/63 (06/14 0430) SpO2:  [91 %-100 %] 100 % (06/14 0430) Weight:  [53.9 kg] 53.9 kg (06/14 0430)  Intake/Output from previous day: 06/13 0701 - 06/14 0700 In: 351.3 [P.O.:240; I.V.:111.3] Out: 650 [Urine:650] Intake/Output from this shift: No intake/output data recorded.  Physical Exam: Neck: no adenopathy, no carotid bruit, no JVD, and supple, symmetrical, trachea midline Lungs: clear to auscultation bilaterally Heart: regular rate and rhythm, S1, S2 normal, and soft systolic murmur noted Abdomen: soft, bowel sounds present.  epigastric tenderness noted Extremities: extremities normal, atraumatic, no cyanosis or edema  Lab Results: Recent Labs    03/25/22 0551 03/26/22 0107  WBC 4.9 5.4  HGB 10.7* 10.3*  PLT 113* 127*   Recent Labs    03/24/22 0502 03/26/22 0107  NA 141 140  K 4.1 4.3  CL 106 109  CO2 25 25  GLUCOSE 111* 130*  BUN 19 26*  CREATININE 1.22* 1.15*   No results for input(s): "TROPONINI" in the last 72 hours.  Invalid input(s): "CK", "MB" Hepatic Function Panel No results for input(s): "PROT", "ALBUMIN", "AST", "ALT", "ALKPHOS", "BILITOT", "BILIDIR", "IBILI" in the last 72 hours. No results for input(s): "CHOL" in the last 72 hours. No results for input(s): "PROTIME" in the last 72 hours.  Imaging: NM Myocar Multi W/Spect W/Wall Motion / EF  Result Date: 03/25/2022 CLINICAL  DATA:  Chest pain. EXAM: MYOCARDIAL IMAGING WITH SPECT (REST AND PHARMACOLOGIC-STRESS) GATED LEFT VENTRICULAR WALL MOTION STUDY LEFT VENTRICULAR EJECTION FRACTION TECHNIQUE: Standard myocardial SPECT imaging was performed after resting intravenous injection of 10.3 mCi Tc-6mtetrofosmin. Subsequently, intravenous infusion of Lexiscan was performed under the supervision of the Cardiology staff. At peak effect of the drug, 32.0 mCi Tc-955metrofosmin was injected intravenously and standard myocardial SPECT imaging was performed. Quantitative gated imaging was also performed to evaluate left ventricular wall motion, and estimate left ventricular ejection fraction. COMPARISON:  None Available. FINDINGS: Perfusion: Large fixed apical and inferior defect is noted consistent with old infarction. Mild reversibility of anterior myocardium is noted suggesting acute ischemia. Wall Motion: Severe global hypomotility is noted. Left Ventricular Ejection Fraction: 25 % End diastolic volume 13035l End systolic volume 10009l IMPRESSION: 1. Large fixed defect is seen involving apical and inferior myocardium consistent with old infarction. Mild reversibility of anterior myocardium is noted suggesting ischemia. 2. Severe global hypomotility is noted. 3. Left ventricular ejection fraction 25% 4. Non invasive risk stratification*: High *2012 Appropriate Use Criteria for Coronary Revascularization Focused Update: J Am Coll Cardiol. 203818;29(9):371-696http://content.onairportbarriers.comspx?articleid=1201161 These results will be called to the ordering clinician or representative by the Radiologist Assistant, and communication documented in the PACS or zVision Dashboard. Electronically Signed   By: JaMarijo Conception.D.   On: 03/25/2022 12:59    Cardiac Studies:  Assessment/Plan:  Stable angina, abnormal nuclear stress test Coronary artery disease history of inferior wall MI in the past status post  PCI to RCA and perioperative  non-Q MI in the past Ischemic cardiomyopathy Compensated systolic congestive heart failure Hypertension Diabetes mellitus Hyperlipidemia History of paroxysmal atrial fibrillation on chronic anticoagulation History of bilateral PE Abdominal pain  Plan Continue present management. Follow-up with me in one to 2 weeks, will discuss with patient and family regarding cardiac catheterization.  If continues to have recurrent chest pain as outpatient. I will sign off.  Please call if needed  LOS: 0 days    Charolette Forward 03/26/2022, 8:58 AM

## 2022-03-26 NOTE — Progress Notes (Signed)
   This pt is active with [facility] a home based Palliative care program provided by [facility]. The pt has been getting routine visits from nursing and SW at home for any sx management needs. She is eligible for additional HH therapies if need at d/c if this is needed.   Webb Silversmith RN [phone]

## 2022-03-27 ENCOUNTER — Other Ambulatory Visit (HOSPITAL_COMMUNITY): Payer: Self-pay

## 2022-03-27 DIAGNOSIS — I951 Orthostatic hypotension: Secondary | ICD-10-CM

## 2022-03-27 LAB — CBC
HCT: 33.8 % — ABNORMAL LOW (ref 36.0–46.0)
Hemoglobin: 10.9 g/dL — ABNORMAL LOW (ref 12.0–15.0)
MCH: 29.3 pg (ref 26.0–34.0)
MCHC: 32.2 g/dL (ref 30.0–36.0)
MCV: 90.9 fL (ref 80.0–100.0)
Platelets: 138 10*3/uL — ABNORMAL LOW (ref 150–400)
RBC: 3.72 MIL/uL — ABNORMAL LOW (ref 3.87–5.11)
RDW: 17.3 % — ABNORMAL HIGH (ref 11.5–15.5)
WBC: 5.2 10*3/uL (ref 4.0–10.5)
nRBC: 0 % (ref 0.0–0.2)

## 2022-03-27 LAB — GLUCOSE, CAPILLARY: Glucose-Capillary: 138 mg/dL — ABNORMAL HIGH (ref 70–99)

## 2022-03-27 MED ORDER — FUROSEMIDE 80 MG PO TABS: 40.0000 mg | ORAL_TABLET | Freq: Every day | ORAL | 3 refills | Status: DC | PRN

## 2022-03-27 MED ORDER — SACUBITRIL-VALSARTAN 24-26 MG PO TABS
1.0000 | ORAL_TABLET | Freq: Two times a day (BID) | ORAL | Status: DC
  Administered 2022-03-27: 1 via ORAL
  Filled 2022-03-27: qty 1

## 2022-03-27 MED ORDER — SACUBITRIL-VALSARTAN 24-26 MG PO TABS
1.0000 | ORAL_TABLET | Freq: Two times a day (BID) | ORAL | 2 refills | Status: DC
  Filled 2022-03-27: qty 60, 30d supply, fill #0

## 2022-03-27 NOTE — Progress Notes (Signed)
Physical Therapy Treatment Patient Details Name: Dana Coleman MRN: [id number] DOB: [birth year] Today's Date: 03/27/2022   History of Present Illness Pt is an 81 y.o. female admitted 03/23/22 with chest pain, SOB. Workup for stable angina. Course complicated by symptomatic orthostatic hypotension. PMH includes PE, HTN, DM, afib, CAD, breast CA, HF, MI, esophogeal perforation.    PT Comments    Pt very pleasant with increased activity tolerance this session. Initial BP drop with standing but able to rise after 3 min and maintain with mobility. Pt educated for awareness of BP drop, slow transitions and sitting if feeling dizzy. Pt also educated for HEP and progressive mobility.   Orthostatic BPs  Supine 126/76  Sitting 125/71, HR 68     Standing 89/64, HR 75  Standing after 3 min  Standing after 15' of gait  Sitting after hall ambulation 104/73, HR 73 114/71   123/78, HR 67      Recommendations for follow up therapy are one component of a multi-disciplinary discharge planning process, led by the attending physician.  Recommendations may be updated based on patient status, additional functional criteria and insurance authorization.  Follow Up Recommendations  Home health PT     Assistance Recommended at Discharge Intermittent Supervision/Assistance  Patient can return home with the following A little help with walking and/or transfers;A little help with bathing/dressing/bathroom;Assistance with cooking/housework;Assist for transportation   Equipment Recommendations  None recommended by PT    Recommendations for Other Services       Precautions / Restrictions Precautions Precautions: Fall;Other (comment) Precaution Comments: Watch BP Restrictions Weight Bearing Restrictions: No     Mobility  Bed Mobility Overal bed mobility: Modified Independent             General bed mobility comments: with rail supine to sit    Transfers Overall transfer level:  Modified independent                 General transfer comment: from bed and toilet with rail    Ambulation/Gait Ambulation/Gait assistance: Min assist Gait Distance (Feet): 320 Feet Assistive device: 1 person hand held assist     Gait velocity interpretation: 1.31 - 2.62 ft/sec, indicative of limited community ambulator   General Gait Details: pt walked 15' without RW and initial 8' in hall without UE support but then pt reaching out for therapist with single HHA throughout the remainder of gait without dizziness or weakness but limited by fatigue   Stairs             Wheelchair Mobility    Modified Rankin (Stroke Patients Only)       Balance Overall balance assessment: Needs assistance Sitting-balance support: No upper extremity supported, Feet supported Sitting balance-Leahy Scale: Good     Standing balance support: No upper extremity supported, During functional activity Standing balance-Leahy Scale: Fair Standing balance comment: static standing and limited gait without UE support                            Cognition Arousal/Alertness: Awake/alert Behavior During Therapy: WFL for tasks assessed/performed Overall Cognitive Status: Within Functional Limits for tasks assessed                                          Exercises General Exercises - Lower Extremity Long Arc Quad: AROM, Both, Seated, 20 reps  Hip Flexion/Marching: AROM, Both, Seated, 20 reps    General Comments        Pertinent Vitals/Pain Pain Assessment Pain Assessment: No/denies pain    Home Living                          Prior Function            PT Goals (current goals can now be found in the care plan section) Progress towards PT goals: Progressing toward goals    Frequency    Min 3X/week      PT Plan Current plan remains appropriate    Co-evaluation              AM-PAC PT "6 Clicks" Mobility   Outcome Measure   Help needed turning from your back to your side while in a flat bed without using bedrails?: None Help needed moving from lying on your back to sitting on the side of a flat bed without using bedrails?: None Help needed moving to and from a bed to a chair (including a wheelchair)?: A Little Help needed standing up from a chair using your arms (e.g., wheelchair or bedside chair)?: A Little Help needed to walk in hospital room?: A Little Help needed climbing 3-5 steps with a railing? : A Little 6 Click Score: 20    End of Session Equipment Utilized During Treatment: Gait belt Activity Tolerance: Patient tolerated treatment well Patient left: in chair;with call bell/phone within reach Nurse Communication: Mobility status PT Visit Diagnosis: Unsteadiness on feet (R26.81);Muscle weakness (generalized) (M62.81);Difficulty in walking, not elsewhere classified (R26.2)     Time: 7672-0947 PT Time Calculation (min) (ACUTE ONLY): 27 min  Charges:  $Gait Training: 8-22 mins $Therapeutic Activity: 8-22 mins                     Bayard Males, PT Acute Rehabilitation Services Office: [place] 03/27/2022, 11:01 AM

## 2022-03-27 NOTE — Plan of Care (Signed)

## 2022-03-27 NOTE — Progress Notes (Signed)
FPTS Brief Progress Note  S: sleeping   O: BP 107/66 (BP Location: Right Arm)   Pulse 66   Temp 98.3 F (36.8 C) (Oral)   Resp 16   Ht '5\' 2"'$  (1.575 m)   Wt 53.9 kg   SpO2 100%   BMI 21.73 kg/m     A/P: Orthostatic BP - Orthostasis + with standing, recovers well at 3 minutes - BP appropriate overnight 100s/60s - Orders reviewed. Labs for AM ordered, which was adjusted as needed.    Gladys Damme, MD 03/27/2022, 4:06 AM PGY-3, [place] [facility] Night Resident  Please page [phone] with questions.

## 2022-03-27 NOTE — Discharge Summary (Signed)
[facility] Discharge Summary  Patient name: Gage record number: [id number] Date of birth: 09-Mar-1941 Age: 81 y.o. Gender: female Date of Admission: 03/23/2022  Date of Discharge: 03/27/2022 Admitting Physician: Holley Bouche, MD  Primary Care Provider: Concepcion Living, MD Consultants: Cardiology  Indication for Hospitalization: Chest pain  Discharge Diagnoses/Problem List:  Principal Problem:   Chest pain Active Problems:   Hypertension   Type 2 diabetes mellitus with other specified complication (HCC)   Dyspnea on exertion   Paroxysmal atrial fibrillation (HCC)    Disposition: Home with [facility] PT/OT/RN  Discharge Condition: Stable  Discharge Exam:  Blood pressure 111/60, pulse 89, temperature 98.1 F (36.7 C), temperature source Oral, resp. rate 16, height '5\' 2"'$  (1.575 m), weight 55.5 kg, SpO2 100 %.  General: Awake, alert, NAD Cardiovascular: RRR, no murmurs auscultated Respiratory: CTA B, normal WOB  Brief Hospital Course:  [name] is a 81 y.o.female with a history of HFrEF, HTN, HLD, CAD, T2DM, GERD, history of breast cancer and cervical cancer who was admitted to the [facility] Teaching Service at [facility] for chest pain. Her hospital course is detailed below:  Chest pain Presented with chest pain that improved with nitroglycerin.  EKG and troponins normal and BNP normal for patient.  BMP unremarkable, with patient at baseline creatinine value and normal electrolytes.  Clinical physical exam ruled out heart failure exacerbation. Cardiology (Dr. Terrence Dupont) was consulted.  Nuclear stress testing performed and showed LVEF 25%, large fixed defect involving apical and inferior myocardium consistent with old infarction.  Cardiac medications were changed to Toprol-XL 12.5 mg daily, Entresto 24-26 twice daily, spironolactone 12.5 mg daily, Imdur '30mg'$  daily. Hydralazine was discontinued. These changes were made due to patient  experiencing dizziness and hypotension during hospitalization. She was discharged with [facility] PT/OT/RN and advised to follow-up with   Abdominal pain Patient continued to have generalized abdominal pain consistent with her prior abdominal pain which she is experienced since hospitalization in August in which she had PEG tube placed.  She remained stable during hospitalization and continues to eat and drink appropriately.  Additionally, abdominal pain has been unchanged and relieved with Tylenol.  Did not opt to pursue further GI work-up given clinical history.  Other chronic conditions were medically managed with home medications and formulary alternatives as necessary (insomnia, A-fib, CKD 3A, HFrEF, GERD, T2DM, CAD, HTN, HLD)  PCP Follow-up Recommendations: Ensure f/u w/ Cardiologist Dr. Terrence Dupont in 1-2 weeks.  Significant Procedures: None  Significant Labs and Imaging:  Recent Labs  Lab 03/25/22 0551 03/26/22 0107 03/27/22 0408  WBC 4.9 5.4 5.2  HGB 10.7* 10.3* 10.9*  HCT 34.2* 31.5* 33.8*  PLT 113* 127* 138*   Recent Labs  Lab 03/23/22 1433 03/24/22 0502 03/26/22 0107  NA 138 141 140  K 4.2 4.1 4.3  CL 103 106 109  CO2 '22 25 25  '$ GLUCOSE 203* 111* 130*  BUN 19 19 26*  CREATININE 1.12* 1.22* 1.15*  CALCIUM 9.5 9.1 8.8*   Results/Tests Pending at Time of Discharge: None  Discharge Medications:  Allergies as of 03/27/2022   No Known Allergies      Medication List     STOP taking these medications    Entresto 97-103 MG Generic drug: sacubitril-valsartan Replaced by: [name] 24-26 MG   hydrALAZINE 50 MG tablet Commonly known as: APRESOLINE       TAKE these medications    acetaminophen 500 MG tablet Commonly known as: TYLENOL Take 500 mg  by mouth every 6 (six) hours as needed for moderate pain.   aspirin EC 81 MG tablet Take 81 mg by mouth in the morning. Swallow whole.   Blood Glucose Monitoring Suppl Misc daily.   Eliquis 2.5 MG Tabs tablet Generic  drug: apixaban Take 1 tablet (2.5 mg total) by mouth 2 (two) times daily. What changed:  medication strength how much to take   Entresto 24-26 MG Generic drug: sacubitril-valsartan Take 1 tablet by mouth 2 (two) times daily. Replaces: Entresto 97-103 MG   furosemide 80 MG tablet Commonly known as: LASIX Take 0.5 tablets (40 mg total) by mouth daily as needed. What changed:  when to take this reasons to take this   glucose blood test strip Commonly known as: ONE TOUCH ULTRA TEST CHECK BLOOD SUGAR TWICE DAILY   Insulin Pen Needle 31G X 5 MM Misc Commonly known as: B-D UF III MINI PEN NEEDLES INJECT INSULIN VIA PEN 6 TIMES DAILY   isosorbide mononitrate 30 MG 24 hr tablet Commonly known as: IMDUR Take 1 tablet (30 mg total) by mouth daily.   Lantus SoloStar 100 UNIT/ML Solostar Pen Generic drug: insulin glargine Inject 6 Units into the skin in the morning.   Melatonin 3 MG Tbdp Take 6 mg by mouth at bedtime as needed (sleep).   metoprolol succinate 25 MG 24 hr tablet Commonly known as: TOPROL-XL Take 0.5 tablets (12.5 mg total) by mouth daily.   omeprazole 20 MG capsule Commonly known as: PRILOSEC Take 40 mg by mouth every morning.   ondansetron 4 MG tablet Commonly known as: Zofran Take 1 tablet (4 mg total) by mouth daily as needed for nausea or vomiting.   OneTouch Delica Lancets 55H Misc 1 Container by Does not apply route as needed.   rosuvastatin 20 MG tablet Commonly known as: CRESTOR TAKE 1 TABLET BY MOUTH EVERYDAY AT BEDTIME What changed: See the new instructions.   spironolactone 25 MG tablet Commonly known as: ALDACTONE Take 0.5 tablets (12.5 mg total) by mouth daily.   traZODone 50 MG tablet Commonly known as: DESYREL Take 50 mg by mouth at bedtime as needed for sleep.        Discharge Instructions: Please refer to Patient Instructions section of EMR for full details.  Patient was counseled important signs and symptoms that should prompt  return to medical care, changes in medications, dietary instructions, activity restrictions, and follow up appointments.   Follow-Up Appointments: Future Appointments  Date Time Provider [place]  04/03/2022  8:50 AM Concepcion Living, MD [facility]  04/30/2022  1:30 PM [facility] PA/NP [facility] None  06/02/2022  1:00 PM [place] [facility] OP 1 [facility]  06/02/2022  2:20 PM Bensimhon, Shaune Pascal, MD [place] None    Wells Guiles, DO 03/27/2022, 1:54 PM PGY-1, [place]

## 2022-03-27 NOTE — Progress Notes (Signed)
Orthostatics taken before bed just after night med administration    03/26/22 2208  Orthostatic Lying   BP- Lying 118/75  Pulse- Lying 90  Orthostatic Sitting  BP- Sitting 111/72  Pulse- Sitting 91  Orthostatic Standing at 0 minutes  BP- Standing at 0 minutes 95/72  Pulse- Standing at 0 minutes 87  Orthostatic Standing at 3 minutes  BP- Standing at 3 minutes 123/76  Pulse- Standing at 3 minutes 83

## 2022-03-28 ENCOUNTER — Telehealth: Payer: Self-pay

## 2022-03-28 DIAGNOSIS — Z86711 Personal history of pulmonary embolism: Secondary | ICD-10-CM | POA: Diagnosis not present

## 2022-03-28 DIAGNOSIS — E1122 Type 2 diabetes mellitus with diabetic chronic kidney disease: Secondary | ICD-10-CM | POA: Diagnosis not present

## 2022-03-28 DIAGNOSIS — Z9181 History of falling: Secondary | ICD-10-CM | POA: Diagnosis not present

## 2022-03-28 DIAGNOSIS — Z853 Personal history of malignant neoplasm of breast: Secondary | ICD-10-CM | POA: Diagnosis not present

## 2022-03-28 DIAGNOSIS — F32A Depression, unspecified: Secondary | ICD-10-CM | POA: Diagnosis not present

## 2022-03-28 DIAGNOSIS — M8580 Other specified disorders of bone density and structure, unspecified site: Secondary | ICD-10-CM | POA: Diagnosis not present

## 2022-03-28 DIAGNOSIS — K219 Gastro-esophageal reflux disease without esophagitis: Secondary | ICD-10-CM | POA: Diagnosis not present

## 2022-03-28 DIAGNOSIS — I255 Ischemic cardiomyopathy: Secondary | ICD-10-CM | POA: Diagnosis not present

## 2022-03-28 DIAGNOSIS — I252 Old myocardial infarction: Secondary | ICD-10-CM | POA: Diagnosis not present

## 2022-03-28 DIAGNOSIS — I130 Hypertensive heart and chronic kidney disease with heart failure and stage 1 through stage 4 chronic kidney disease, or unspecified chronic kidney disease: Secondary | ICD-10-CM | POA: Diagnosis not present

## 2022-03-28 DIAGNOSIS — M19049 Primary osteoarthritis, unspecified hand: Secondary | ICD-10-CM | POA: Diagnosis not present

## 2022-03-28 DIAGNOSIS — E785 Hyperlipidemia, unspecified: Secondary | ICD-10-CM | POA: Diagnosis not present

## 2022-03-28 DIAGNOSIS — Z794 Long term (current) use of insulin: Secondary | ICD-10-CM | POA: Diagnosis not present

## 2022-03-28 DIAGNOSIS — G4700 Insomnia, unspecified: Secondary | ICD-10-CM | POA: Diagnosis not present

## 2022-03-28 DIAGNOSIS — I5020 Unspecified systolic (congestive) heart failure: Secondary | ICD-10-CM | POA: Diagnosis not present

## 2022-03-28 DIAGNOSIS — Z7901 Long term (current) use of anticoagulants: Secondary | ICD-10-CM | POA: Diagnosis not present

## 2022-03-28 DIAGNOSIS — N1831 Chronic kidney disease, stage 3a: Secondary | ICD-10-CM | POA: Diagnosis not present

## 2022-03-28 DIAGNOSIS — I25119 Atherosclerotic heart disease of native coronary artery with unspecified angina pectoris: Secondary | ICD-10-CM | POA: Diagnosis not present

## 2022-03-28 DIAGNOSIS — I480 Paroxysmal atrial fibrillation: Secondary | ICD-10-CM | POA: Diagnosis not present

## 2022-03-28 DIAGNOSIS — Z7982 Long term (current) use of aspirin: Secondary | ICD-10-CM | POA: Diagnosis not present

## 2022-03-28 NOTE — Telephone Encounter (Signed)
Amy from [facility] for nursing verbal orders as follows:  1 time(s) weekly for 1 week(s), then 2 time(s) weekly for 2 week(s), then  I time (s) weekly for 6 weeks.   Also requested verbal orders for a bath aide.   Verbal orders given per [facility] protocol  Talbot Grumbling, RN

## 2022-03-31 DIAGNOSIS — I255 Ischemic cardiomyopathy: Secondary | ICD-10-CM | POA: Diagnosis not present

## 2022-03-31 DIAGNOSIS — F32A Depression, unspecified: Secondary | ICD-10-CM | POA: Diagnosis not present

## 2022-03-31 DIAGNOSIS — I252 Old myocardial infarction: Secondary | ICD-10-CM | POA: Diagnosis not present

## 2022-03-31 DIAGNOSIS — Z9181 History of falling: Secondary | ICD-10-CM | POA: Diagnosis not present

## 2022-03-31 DIAGNOSIS — N1831 Chronic kidney disease, stage 3a: Secondary | ICD-10-CM | POA: Diagnosis not present

## 2022-03-31 DIAGNOSIS — Z7901 Long term (current) use of anticoagulants: Secondary | ICD-10-CM | POA: Diagnosis not present

## 2022-03-31 DIAGNOSIS — Z86711 Personal history of pulmonary embolism: Secondary | ICD-10-CM | POA: Diagnosis not present

## 2022-03-31 DIAGNOSIS — G4700 Insomnia, unspecified: Secondary | ICD-10-CM | POA: Diagnosis not present

## 2022-03-31 DIAGNOSIS — E785 Hyperlipidemia, unspecified: Secondary | ICD-10-CM | POA: Diagnosis not present

## 2022-03-31 DIAGNOSIS — Z794 Long term (current) use of insulin: Secondary | ICD-10-CM | POA: Diagnosis not present

## 2022-03-31 DIAGNOSIS — K219 Gastro-esophageal reflux disease without esophagitis: Secondary | ICD-10-CM | POA: Diagnosis not present

## 2022-03-31 DIAGNOSIS — I5020 Unspecified systolic (congestive) heart failure: Secondary | ICD-10-CM | POA: Diagnosis not present

## 2022-03-31 DIAGNOSIS — E1122 Type 2 diabetes mellitus with diabetic chronic kidney disease: Secondary | ICD-10-CM | POA: Diagnosis not present

## 2022-03-31 DIAGNOSIS — Z853 Personal history of malignant neoplasm of breast: Secondary | ICD-10-CM | POA: Diagnosis not present

## 2022-03-31 DIAGNOSIS — I480 Paroxysmal atrial fibrillation: Secondary | ICD-10-CM | POA: Diagnosis not present

## 2022-03-31 DIAGNOSIS — Z7982 Long term (current) use of aspirin: Secondary | ICD-10-CM | POA: Diagnosis not present

## 2022-03-31 DIAGNOSIS — I130 Hypertensive heart and chronic kidney disease with heart failure and stage 1 through stage 4 chronic kidney disease, or unspecified chronic kidney disease: Secondary | ICD-10-CM | POA: Diagnosis not present

## 2022-03-31 DIAGNOSIS — I25119 Atherosclerotic heart disease of native coronary artery with unspecified angina pectoris: Secondary | ICD-10-CM | POA: Diagnosis not present

## 2022-03-31 DIAGNOSIS — M19049 Primary osteoarthritis, unspecified hand: Secondary | ICD-10-CM | POA: Diagnosis not present

## 2022-03-31 DIAGNOSIS — M8580 Other specified disorders of bone density and structure, unspecified site: Secondary | ICD-10-CM | POA: Diagnosis not present

## 2022-04-01 ENCOUNTER — Telehealth: Payer: Self-pay

## 2022-04-01 DIAGNOSIS — I255 Ischemic cardiomyopathy: Secondary | ICD-10-CM | POA: Diagnosis not present

## 2022-04-01 DIAGNOSIS — N1831 Chronic kidney disease, stage 3a: Secondary | ICD-10-CM | POA: Diagnosis not present

## 2022-04-01 DIAGNOSIS — I25119 Atherosclerotic heart disease of native coronary artery with unspecified angina pectoris: Secondary | ICD-10-CM | POA: Diagnosis not present

## 2022-04-01 DIAGNOSIS — E1122 Type 2 diabetes mellitus with diabetic chronic kidney disease: Secondary | ICD-10-CM | POA: Diagnosis not present

## 2022-04-01 DIAGNOSIS — K219 Gastro-esophageal reflux disease without esophagitis: Secondary | ICD-10-CM | POA: Diagnosis not present

## 2022-04-01 DIAGNOSIS — I5020 Unspecified systolic (congestive) heart failure: Secondary | ICD-10-CM | POA: Diagnosis not present

## 2022-04-01 DIAGNOSIS — Z9181 History of falling: Secondary | ICD-10-CM | POA: Diagnosis not present

## 2022-04-01 DIAGNOSIS — M8580 Other specified disorders of bone density and structure, unspecified site: Secondary | ICD-10-CM | POA: Diagnosis not present

## 2022-04-01 DIAGNOSIS — Z794 Long term (current) use of insulin: Secondary | ICD-10-CM | POA: Diagnosis not present

## 2022-04-01 DIAGNOSIS — I480 Paroxysmal atrial fibrillation: Secondary | ICD-10-CM | POA: Diagnosis not present

## 2022-04-01 DIAGNOSIS — Z7901 Long term (current) use of anticoagulants: Secondary | ICD-10-CM | POA: Diagnosis not present

## 2022-04-01 DIAGNOSIS — Z86711 Personal history of pulmonary embolism: Secondary | ICD-10-CM | POA: Diagnosis not present

## 2022-04-01 DIAGNOSIS — Z7982 Long term (current) use of aspirin: Secondary | ICD-10-CM | POA: Diagnosis not present

## 2022-04-01 DIAGNOSIS — M19049 Primary osteoarthritis, unspecified hand: Secondary | ICD-10-CM | POA: Diagnosis not present

## 2022-04-01 DIAGNOSIS — G4700 Insomnia, unspecified: Secondary | ICD-10-CM | POA: Diagnosis not present

## 2022-04-01 DIAGNOSIS — Z853 Personal history of malignant neoplasm of breast: Secondary | ICD-10-CM | POA: Diagnosis not present

## 2022-04-01 DIAGNOSIS — I252 Old myocardial infarction: Secondary | ICD-10-CM | POA: Diagnosis not present

## 2022-04-01 DIAGNOSIS — F32A Depression, unspecified: Secondary | ICD-10-CM | POA: Diagnosis not present

## 2022-04-01 DIAGNOSIS — E785 Hyperlipidemia, unspecified: Secondary | ICD-10-CM | POA: Diagnosis not present

## 2022-04-01 DIAGNOSIS — I130 Hypertensive heart and chronic kidney disease with heart failure and stage 1 through stage 4 chronic kidney disease, or unspecified chronic kidney disease: Secondary | ICD-10-CM | POA: Diagnosis not present

## 2022-04-01 NOTE — Telephone Encounter (Signed)
Patient calls nurse line stating that daughter [place] Siren) needs a doctor's note to come and live with her for two months.  This documentation is needed for daughter's work. Asked patient if letter was sufficient or if FMLA paperwork needed to be completed. Patient reports that per daughter only letter is needed.   Patient has follow up visit with PCP on 04/03/22 and was wanting to pick up letter at this time.   Forwarding request to PCP.   Talbot Grumbling, RN

## 2022-04-02 ENCOUNTER — Telehealth (HOSPITAL_COMMUNITY): Payer: Self-pay | Admitting: Licensed Clinical Social Worker

## 2022-04-02 DIAGNOSIS — Z86711 Personal history of pulmonary embolism: Secondary | ICD-10-CM | POA: Diagnosis not present

## 2022-04-02 DIAGNOSIS — K219 Gastro-esophageal reflux disease without esophagitis: Secondary | ICD-10-CM | POA: Diagnosis not present

## 2022-04-02 DIAGNOSIS — M19049 Primary osteoarthritis, unspecified hand: Secondary | ICD-10-CM | POA: Diagnosis not present

## 2022-04-02 DIAGNOSIS — Z9181 History of falling: Secondary | ICD-10-CM | POA: Diagnosis not present

## 2022-04-02 DIAGNOSIS — G4700 Insomnia, unspecified: Secondary | ICD-10-CM | POA: Diagnosis not present

## 2022-04-02 DIAGNOSIS — I255 Ischemic cardiomyopathy: Secondary | ICD-10-CM | POA: Diagnosis not present

## 2022-04-02 DIAGNOSIS — I130 Hypertensive heart and chronic kidney disease with heart failure and stage 1 through stage 4 chronic kidney disease, or unspecified chronic kidney disease: Secondary | ICD-10-CM | POA: Diagnosis not present

## 2022-04-02 DIAGNOSIS — I252 Old myocardial infarction: Secondary | ICD-10-CM | POA: Diagnosis not present

## 2022-04-02 DIAGNOSIS — E785 Hyperlipidemia, unspecified: Secondary | ICD-10-CM | POA: Diagnosis not present

## 2022-04-02 DIAGNOSIS — N1831 Chronic kidney disease, stage 3a: Secondary | ICD-10-CM | POA: Diagnosis not present

## 2022-04-02 DIAGNOSIS — Z7901 Long term (current) use of anticoagulants: Secondary | ICD-10-CM | POA: Diagnosis not present

## 2022-04-02 DIAGNOSIS — I25119 Atherosclerotic heart disease of native coronary artery with unspecified angina pectoris: Secondary | ICD-10-CM | POA: Diagnosis not present

## 2022-04-02 DIAGNOSIS — F32A Depression, unspecified: Secondary | ICD-10-CM | POA: Diagnosis not present

## 2022-04-02 DIAGNOSIS — E1122 Type 2 diabetes mellitus with diabetic chronic kidney disease: Secondary | ICD-10-CM | POA: Diagnosis not present

## 2022-04-02 DIAGNOSIS — M8580 Other specified disorders of bone density and structure, unspecified site: Secondary | ICD-10-CM | POA: Diagnosis not present

## 2022-04-02 DIAGNOSIS — Z794 Long term (current) use of insulin: Secondary | ICD-10-CM | POA: Diagnosis not present

## 2022-04-02 DIAGNOSIS — Z7982 Long term (current) use of aspirin: Secondary | ICD-10-CM | POA: Diagnosis not present

## 2022-04-02 DIAGNOSIS — I5020 Unspecified systolic (congestive) heart failure: Secondary | ICD-10-CM | POA: Diagnosis not present

## 2022-04-02 DIAGNOSIS — I480 Paroxysmal atrial fibrillation: Secondary | ICD-10-CM | POA: Diagnosis not present

## 2022-04-02 DIAGNOSIS — Z853 Personal history of malignant neoplasm of breast: Secondary | ICD-10-CM | POA: Diagnosis not present

## 2022-04-02 NOTE — Telephone Encounter (Signed)
CSW called pt to discuss paramedicine referral.  Pt HH RN was originally supposed to be stopping but after recent hospital stay she is going to continue visiting pt for next couple of months.  [place] RN Amy [phone]- CSW spoke with RN and informed if at the end of her time with pt if she needs further support then she can reach out to [place] regarding enrollment in paramedicine program.  Will continue to follow and assist as needed  Jorge Ny, [facility] Desk#: [phone] Cell#: [phone]

## 2022-04-03 ENCOUNTER — Ambulatory Visit (INDEPENDENT_AMBULATORY_CARE_PROVIDER_SITE_OTHER): Payer: Medicare Other | Admitting: Family Medicine

## 2022-04-03 ENCOUNTER — Encounter: Payer: Self-pay | Admitting: Family Medicine

## 2022-04-03 DIAGNOSIS — I10 Essential (primary) hypertension: Secondary | ICD-10-CM | POA: Diagnosis not present

## 2022-04-03 DIAGNOSIS — I5020 Unspecified systolic (congestive) heart failure: Secondary | ICD-10-CM | POA: Diagnosis not present

## 2022-04-03 NOTE — Assessment & Plan Note (Signed)
Systolic blood pressure in the 130s today with diastolic in 76H.  Has not taken her antihypertensive medications today.  We will continue on current regiment and she has follow-up scheduled with cardiology tomorrow who can adjust medications as needed.  Recommended she take her medications tomorrow for the visit with heart failure team.

## 2022-04-03 NOTE — Progress Notes (Signed)
    SUBJECTIVE:   CHIEF COMPLAINT / HPI:   Hospital follow-up Patient presents today for hospital follow-up after being seen for chest pain.  She reports that since being admitted she has been doing well and has no chest pain or shortness of breath.  She is scheduled to see her cardiologist tomorrow.  Health care needs Patient reports that she was recommended to have help at home.  Her daughter is willing to do this but her daughter needs a letter stating that she may be appropriate for this role to help care for her mother.  She will help with her ADLs for the next 2 to 3 months.  OBJECTIVE:   BP 138/62   Pulse 60   Ht '5\' 2"'$  (1.575 m)   Wt 116 lb 12.8 oz (53 kg)   SpO2 99%   BMI 21.36 kg/m   General: Pleasant 81 year old female in no acute distress Cardiac: Regular rate and rhythm, no murmurs appreciated Respiratory: Normal work of breathing, lungs clear to auscultation bilaterally Abdomen: Soft, nontender MSK: Ambulates with walker  ASSESSMENT/PLAN:   Hypertension Systolic blood pressure in the 130s today with diastolic in 37S.  Has not taken her antihypertensive medications today.  We will continue on current regiment and she has follow-up scheduled with cardiology tomorrow who can adjust medications as needed.  Recommended she take her medications tomorrow for the visit with heart failure team.  HFrEF (heart failure with reduced ejection fraction) ([place]) Patient is doing well since discharge from the hospital.  Plans for follow-up with heart failure clinic tomorrow 6/23.  Will take blood pressure medications prior to heart failure visit.     Concepcion Living, MD [place]

## 2022-04-03 NOTE — Patient Instructions (Addendum)
It was great seeing you today.  You look wonderful.  Please be sure to go to your cardiologist appointment.  I have provided you a letter for your daughter's work.  It was a pleasure taking care of you throughout my residency.  I hope you have a wonderful day!

## 2022-04-03 NOTE — Assessment & Plan Note (Signed)
Patient is doing well since discharge from the hospital.  Plans for follow-up with heart failure clinic tomorrow 6/23.  Will take blood pressure medications prior to heart failure visit.

## 2022-04-04 DIAGNOSIS — E785 Hyperlipidemia, unspecified: Secondary | ICD-10-CM | POA: Diagnosis not present

## 2022-04-04 DIAGNOSIS — M8580 Other specified disorders of bone density and structure, unspecified site: Secondary | ICD-10-CM | POA: Diagnosis not present

## 2022-04-04 DIAGNOSIS — Z7901 Long term (current) use of anticoagulants: Secondary | ICD-10-CM | POA: Diagnosis not present

## 2022-04-04 DIAGNOSIS — I480 Paroxysmal atrial fibrillation: Secondary | ICD-10-CM | POA: Diagnosis not present

## 2022-04-04 DIAGNOSIS — K219 Gastro-esophageal reflux disease without esophagitis: Secondary | ICD-10-CM | POA: Diagnosis not present

## 2022-04-04 DIAGNOSIS — Z853 Personal history of malignant neoplasm of breast: Secondary | ICD-10-CM | POA: Diagnosis not present

## 2022-04-04 DIAGNOSIS — Z86711 Personal history of pulmonary embolism: Secondary | ICD-10-CM | POA: Diagnosis not present

## 2022-04-04 DIAGNOSIS — F32A Depression, unspecified: Secondary | ICD-10-CM | POA: Diagnosis not present

## 2022-04-04 DIAGNOSIS — I5020 Unspecified systolic (congestive) heart failure: Secondary | ICD-10-CM | POA: Diagnosis not present

## 2022-04-04 DIAGNOSIS — I130 Hypertensive heart and chronic kidney disease with heart failure and stage 1 through stage 4 chronic kidney disease, or unspecified chronic kidney disease: Secondary | ICD-10-CM | POA: Diagnosis not present

## 2022-04-04 DIAGNOSIS — I252 Old myocardial infarction: Secondary | ICD-10-CM | POA: Diagnosis not present

## 2022-04-04 DIAGNOSIS — I25118 Atherosclerotic heart disease of native coronary artery with other forms of angina pectoris: Secondary | ICD-10-CM | POA: Diagnosis not present

## 2022-04-04 DIAGNOSIS — G4700 Insomnia, unspecified: Secondary | ICD-10-CM | POA: Diagnosis not present

## 2022-04-04 DIAGNOSIS — Z9181 History of falling: Secondary | ICD-10-CM | POA: Diagnosis not present

## 2022-04-04 DIAGNOSIS — N1831 Chronic kidney disease, stage 3a: Secondary | ICD-10-CM | POA: Diagnosis not present

## 2022-04-04 DIAGNOSIS — I10 Essential (primary) hypertension: Secondary | ICD-10-CM | POA: Diagnosis not present

## 2022-04-04 DIAGNOSIS — M19049 Primary osteoarthritis, unspecified hand: Secondary | ICD-10-CM | POA: Diagnosis not present

## 2022-04-04 DIAGNOSIS — E1122 Type 2 diabetes mellitus with diabetic chronic kidney disease: Secondary | ICD-10-CM | POA: Diagnosis not present

## 2022-04-04 DIAGNOSIS — Z794 Long term (current) use of insulin: Secondary | ICD-10-CM | POA: Diagnosis not present

## 2022-04-04 DIAGNOSIS — I25119 Atherosclerotic heart disease of native coronary artery with unspecified angina pectoris: Secondary | ICD-10-CM | POA: Diagnosis not present

## 2022-04-04 DIAGNOSIS — I255 Ischemic cardiomyopathy: Secondary | ICD-10-CM | POA: Diagnosis not present

## 2022-04-04 DIAGNOSIS — Z7982 Long term (current) use of aspirin: Secondary | ICD-10-CM | POA: Diagnosis not present

## 2022-04-08 DIAGNOSIS — I480 Paroxysmal atrial fibrillation: Secondary | ICD-10-CM | POA: Diagnosis not present

## 2022-04-08 DIAGNOSIS — I25119 Atherosclerotic heart disease of native coronary artery with unspecified angina pectoris: Secondary | ICD-10-CM | POA: Diagnosis not present

## 2022-04-08 DIAGNOSIS — Z794 Long term (current) use of insulin: Secondary | ICD-10-CM | POA: Diagnosis not present

## 2022-04-08 DIAGNOSIS — Z853 Personal history of malignant neoplasm of breast: Secondary | ICD-10-CM | POA: Diagnosis not present

## 2022-04-08 DIAGNOSIS — Z7982 Long term (current) use of aspirin: Secondary | ICD-10-CM | POA: Diagnosis not present

## 2022-04-08 DIAGNOSIS — Z86711 Personal history of pulmonary embolism: Secondary | ICD-10-CM | POA: Diagnosis not present

## 2022-04-08 DIAGNOSIS — K219 Gastro-esophageal reflux disease without esophagitis: Secondary | ICD-10-CM | POA: Diagnosis not present

## 2022-04-08 DIAGNOSIS — Z9181 History of falling: Secondary | ICD-10-CM | POA: Diagnosis not present

## 2022-04-08 DIAGNOSIS — I130 Hypertensive heart and chronic kidney disease with heart failure and stage 1 through stage 4 chronic kidney disease, or unspecified chronic kidney disease: Secondary | ICD-10-CM | POA: Diagnosis not present

## 2022-04-08 DIAGNOSIS — G4700 Insomnia, unspecified: Secondary | ICD-10-CM | POA: Diagnosis not present

## 2022-04-08 DIAGNOSIS — I255 Ischemic cardiomyopathy: Secondary | ICD-10-CM | POA: Diagnosis not present

## 2022-04-08 DIAGNOSIS — F32A Depression, unspecified: Secondary | ICD-10-CM | POA: Diagnosis not present

## 2022-04-08 DIAGNOSIS — N1831 Chronic kidney disease, stage 3a: Secondary | ICD-10-CM | POA: Diagnosis not present

## 2022-04-08 DIAGNOSIS — M19049 Primary osteoarthritis, unspecified hand: Secondary | ICD-10-CM | POA: Diagnosis not present

## 2022-04-08 DIAGNOSIS — E785 Hyperlipidemia, unspecified: Secondary | ICD-10-CM | POA: Diagnosis not present

## 2022-04-08 DIAGNOSIS — E1122 Type 2 diabetes mellitus with diabetic chronic kidney disease: Secondary | ICD-10-CM | POA: Diagnosis not present

## 2022-04-08 DIAGNOSIS — I5020 Unspecified systolic (congestive) heart failure: Secondary | ICD-10-CM | POA: Diagnosis not present

## 2022-04-08 DIAGNOSIS — M8580 Other specified disorders of bone density and structure, unspecified site: Secondary | ICD-10-CM | POA: Diagnosis not present

## 2022-04-08 DIAGNOSIS — Z7901 Long term (current) use of anticoagulants: Secondary | ICD-10-CM | POA: Diagnosis not present

## 2022-04-08 DIAGNOSIS — I252 Old myocardial infarction: Secondary | ICD-10-CM | POA: Diagnosis not present

## 2022-04-09 DIAGNOSIS — Z7901 Long term (current) use of anticoagulants: Secondary | ICD-10-CM | POA: Diagnosis not present

## 2022-04-09 DIAGNOSIS — K219 Gastro-esophageal reflux disease without esophagitis: Secondary | ICD-10-CM | POA: Diagnosis not present

## 2022-04-09 DIAGNOSIS — F32A Depression, unspecified: Secondary | ICD-10-CM | POA: Diagnosis not present

## 2022-04-09 DIAGNOSIS — G4700 Insomnia, unspecified: Secondary | ICD-10-CM | POA: Diagnosis not present

## 2022-04-09 DIAGNOSIS — Z7982 Long term (current) use of aspirin: Secondary | ICD-10-CM | POA: Diagnosis not present

## 2022-04-09 DIAGNOSIS — Z853 Personal history of malignant neoplasm of breast: Secondary | ICD-10-CM | POA: Diagnosis not present

## 2022-04-09 DIAGNOSIS — N1831 Chronic kidney disease, stage 3a: Secondary | ICD-10-CM | POA: Diagnosis not present

## 2022-04-09 DIAGNOSIS — I5020 Unspecified systolic (congestive) heart failure: Secondary | ICD-10-CM | POA: Diagnosis not present

## 2022-04-09 DIAGNOSIS — M19049 Primary osteoarthritis, unspecified hand: Secondary | ICD-10-CM | POA: Diagnosis not present

## 2022-04-09 DIAGNOSIS — E785 Hyperlipidemia, unspecified: Secondary | ICD-10-CM | POA: Diagnosis not present

## 2022-04-09 DIAGNOSIS — Z9181 History of falling: Secondary | ICD-10-CM | POA: Diagnosis not present

## 2022-04-09 DIAGNOSIS — Z794 Long term (current) use of insulin: Secondary | ICD-10-CM | POA: Diagnosis not present

## 2022-04-09 DIAGNOSIS — I255 Ischemic cardiomyopathy: Secondary | ICD-10-CM | POA: Diagnosis not present

## 2022-04-09 DIAGNOSIS — Z86711 Personal history of pulmonary embolism: Secondary | ICD-10-CM | POA: Diagnosis not present

## 2022-04-09 DIAGNOSIS — I130 Hypertensive heart and chronic kidney disease with heart failure and stage 1 through stage 4 chronic kidney disease, or unspecified chronic kidney disease: Secondary | ICD-10-CM | POA: Diagnosis not present

## 2022-04-09 DIAGNOSIS — I252 Old myocardial infarction: Secondary | ICD-10-CM | POA: Diagnosis not present

## 2022-04-09 DIAGNOSIS — M8580 Other specified disorders of bone density and structure, unspecified site: Secondary | ICD-10-CM | POA: Diagnosis not present

## 2022-04-09 DIAGNOSIS — I480 Paroxysmal atrial fibrillation: Secondary | ICD-10-CM | POA: Diagnosis not present

## 2022-04-09 DIAGNOSIS — E1122 Type 2 diabetes mellitus with diabetic chronic kidney disease: Secondary | ICD-10-CM | POA: Diagnosis not present

## 2022-04-09 DIAGNOSIS — I25119 Atherosclerotic heart disease of native coronary artery with unspecified angina pectoris: Secondary | ICD-10-CM | POA: Diagnosis not present

## 2022-04-10 ENCOUNTER — Telehealth (HOSPITAL_BASED_OUTPATIENT_CLINIC_OR_DEPARTMENT_OTHER): Payer: Self-pay

## 2022-04-10 ENCOUNTER — Other Ambulatory Visit (HOSPITAL_COMMUNITY): Payer: Self-pay

## 2022-04-10 NOTE — Telephone Encounter (Signed)
Pharmacy Transitions of Care Follow-up Telephone Call  Date of discharge: 03/27/22  Discharge Diagnosis: Chest Pain  How have you been since you were released from the hospital? Patient is doing well since leaving the hospital, medications are working great and no CP or SOB. [facility]ills [facility] been sent to [facility].   Medication changes made at discharge: START taking these medications  START taking these medications  Entresto 24-26 MG Generic drug: sacubitril-valsartan Take 1 tablet by mouth 2 (two) times daily. Replaces: Entresto 97-103 MG   CHANGE how you take these medications  CHANGE how you take these medications  Eliquis 2.5 MG Tabs tablet Generic drug: apixaban Take 1 tablet (2.5 mg total) by mouth 2 (two) times daily. What changed:  medication strength how much to take  furosemide 80 MG tablet Commonly known as: LASIX Take 0.5 tablets (40 mg total) by mouth daily as needed. What changed:  when to take this reasons to take this  rosuvastatin 20 MG tablet Commonly known as: CRESTOR TAKE 1 TABLET BY MOUTH EVERYDAY AT BEDTIME What changed: See the new instructions.   CONTINUE taking these medications  CONTINUE taking these medications  acetaminophen 500 MG tablet Commonly known as: TYLENOL  aspirin EC 81 MG tablet  Blood Glucose Monitoring Suppl Misc  glucose blood test strip Commonly known as: ONE TOUCH ULTRA TEST CHECK BLOOD SUGAR TWICE DAILY  Insulin Pen Needle 31G X 5 MM Misc Commonly known as: B-D UF III MINI PEN NEEDLES INJECT INSULIN VIA PEN 6 TIMES DAILY  isosorbide mononitrate 30 MG 24 hr tablet Commonly known as: IMDUR Take 1 tablet (30 mg total) by mouth daily.  Lantus SoloStar 100 UNIT/ML Solostar Pen Generic drug: insulin glargine  Melatonin 3 MG Tbdp  metoprolol succinate 25 MG 24 hr tablet Commonly known as: TOPROL-XL Take 0.5 tablets (12.5 mg total) by mouth daily.  omeprazole 20 MG capsule Commonly known as: PRILOSEC  ondansetron 4 MG  tablet Commonly known as: Zofran Take 1 tablet (4 mg total) by mouth daily as needed for nausea or vomiting.  OneTouch Delica Lancets 10G Misc 1 Container by Does not apply route as needed.  spironolactone 25 MG tablet Commonly known as: ALDACTONE Take 0.5 tablets (12.5 mg total) by mouth daily.  traZODone 50 MG tablet Commonly known as: DESYREL   STOP taking these medications  STOP taking these medications  Entresto 97-103 MG Generic drug: sacubitril-valsartan Replaced by: [name] 24-26 MG  hydrALAZINE 50 MG tablet Commonly known as: APRESOLINE    Medication changes verified by the patient? yes    Medication Accessibility:  [facility] Pharmacy[facility]   Was the patient provided with refills on discharged medications? yes   Have all prescriptions been transferred from [facility] to home pharmacy? yes   Is the patient able to afford medications? insured Eligible patient assistance: no    Medication Review:  APIXABAN (ELIQUIS)  Apixaban 2.5 BID  - Discussed importance of taking medication around the same time everyday  - Reviewed potential DDIs with patient  - Advised patient of medications to avoid (NSAIDs, ASA)  - Educated that Tylenol (acetaminophen) will be the preferred analgesic to prevent risk of bleeding  - Emphasized importance of monitoring for signs and symptoms of bleeding (abnormal bruising, prolonged bleeding, nose bleeds, bleeding from gums, discolored urine, black tarry stools)  - Advised patient to alert all providers of anticoagulation therapy prior to starting a new medication or having a procedure   Follow-up Appointments:  Date Visit Type Length  Department    04/30/2022  1:30 PM EST CHF 30 30 min [facility] [[id number]]    If their condition worsens, is the pt aware to call PCP or go to the Emergency Dept.? yes  Final Patient Assessment: Patient is doing well since leaving the hospital, medications are  working great and no CP or SOB. Refills have been sent to [facility].   Darcus Austin, [facility] 04/10/2022 11:06 AM

## 2022-04-11 ENCOUNTER — Telehealth: Payer: Self-pay | Admitting: Family Medicine

## 2022-04-11 DIAGNOSIS — I5022 Chronic systolic (congestive) heart failure: Secondary | ICD-10-CM | POA: Diagnosis not present

## 2022-04-11 DIAGNOSIS — I25119 Atherosclerotic heart disease of native coronary artery with unspecified angina pectoris: Secondary | ICD-10-CM | POA: Diagnosis not present

## 2022-04-11 DIAGNOSIS — Z7982 Long term (current) use of aspirin: Secondary | ICD-10-CM | POA: Diagnosis not present

## 2022-04-11 DIAGNOSIS — M8580 Other specified disorders of bone density and structure, unspecified site: Secondary | ICD-10-CM | POA: Diagnosis not present

## 2022-04-11 DIAGNOSIS — M19049 Primary osteoarthritis, unspecified hand: Secondary | ICD-10-CM | POA: Diagnosis not present

## 2022-04-11 DIAGNOSIS — I130 Hypertensive heart and chronic kidney disease with heart failure and stage 1 through stage 4 chronic kidney disease, or unspecified chronic kidney disease: Secondary | ICD-10-CM | POA: Diagnosis not present

## 2022-04-11 DIAGNOSIS — N1831 Chronic kidney disease, stage 3a: Secondary | ICD-10-CM | POA: Diagnosis not present

## 2022-04-11 DIAGNOSIS — K219 Gastro-esophageal reflux disease without esophagitis: Secondary | ICD-10-CM | POA: Diagnosis not present

## 2022-04-11 DIAGNOSIS — E1122 Type 2 diabetes mellitus with diabetic chronic kidney disease: Secondary | ICD-10-CM | POA: Diagnosis not present

## 2022-04-11 DIAGNOSIS — Z7901 Long term (current) use of anticoagulants: Secondary | ICD-10-CM | POA: Diagnosis not present

## 2022-04-11 DIAGNOSIS — Z9181 History of falling: Secondary | ICD-10-CM | POA: Diagnosis not present

## 2022-04-11 DIAGNOSIS — I480 Paroxysmal atrial fibrillation: Secondary | ICD-10-CM | POA: Diagnosis not present

## 2022-04-11 DIAGNOSIS — I252 Old myocardial infarction: Secondary | ICD-10-CM | POA: Diagnosis not present

## 2022-04-11 DIAGNOSIS — I255 Ischemic cardiomyopathy: Secondary | ICD-10-CM | POA: Diagnosis not present

## 2022-04-11 DIAGNOSIS — I2510 Atherosclerotic heart disease of native coronary artery without angina pectoris: Secondary | ICD-10-CM | POA: Diagnosis not present

## 2022-04-11 DIAGNOSIS — Z794 Long term (current) use of insulin: Secondary | ICD-10-CM | POA: Diagnosis not present

## 2022-04-11 DIAGNOSIS — G4700 Insomnia, unspecified: Secondary | ICD-10-CM | POA: Diagnosis not present

## 2022-04-11 DIAGNOSIS — Z86711 Personal history of pulmonary embolism: Secondary | ICD-10-CM | POA: Diagnosis not present

## 2022-04-11 DIAGNOSIS — Z853 Personal history of malignant neoplasm of breast: Secondary | ICD-10-CM | POA: Diagnosis not present

## 2022-04-11 DIAGNOSIS — E785 Hyperlipidemia, unspecified: Secondary | ICD-10-CM | POA: Diagnosis not present

## 2022-04-11 DIAGNOSIS — F32A Depression, unspecified: Secondary | ICD-10-CM | POA: Diagnosis not present

## 2022-04-11 DIAGNOSIS — I5020 Unspecified systolic (congestive) heart failure: Secondary | ICD-10-CM | POA: Diagnosis not present

## 2022-04-11 NOTE — Telephone Encounter (Signed)
Patient's grand-daughter dropped off FMLA forms to be completed. Last DOS was 04/03/22. Placed in [employer]

## 2022-04-14 NOTE — Telephone Encounter (Signed)
Clinical info completed on FMLA form.  Placed form in Dr. Ammie Ferrier box for completion.    When form is completed, please route note to "RN Team" and place in wall pocket in front office.   Dana Coleman, CMA

## 2022-04-16 ENCOUNTER — Other Ambulatory Visit (HOSPITAL_COMMUNITY): Payer: Self-pay | Admitting: *Deleted

## 2022-04-16 DIAGNOSIS — I480 Paroxysmal atrial fibrillation: Secondary | ICD-10-CM | POA: Diagnosis not present

## 2022-04-16 DIAGNOSIS — G4700 Insomnia, unspecified: Secondary | ICD-10-CM | POA: Diagnosis not present

## 2022-04-16 DIAGNOSIS — Z9181 History of falling: Secondary | ICD-10-CM | POA: Diagnosis not present

## 2022-04-16 DIAGNOSIS — I25119 Atherosclerotic heart disease of native coronary artery with unspecified angina pectoris: Secondary | ICD-10-CM | POA: Diagnosis not present

## 2022-04-16 DIAGNOSIS — Z86711 Personal history of pulmonary embolism: Secondary | ICD-10-CM | POA: Diagnosis not present

## 2022-04-16 DIAGNOSIS — Z7901 Long term (current) use of anticoagulants: Secondary | ICD-10-CM | POA: Diagnosis not present

## 2022-04-16 DIAGNOSIS — N1831 Chronic kidney disease, stage 3a: Secondary | ICD-10-CM | POA: Diagnosis not present

## 2022-04-16 DIAGNOSIS — M8580 Other specified disorders of bone density and structure, unspecified site: Secondary | ICD-10-CM | POA: Diagnosis not present

## 2022-04-16 DIAGNOSIS — Z7982 Long term (current) use of aspirin: Secondary | ICD-10-CM | POA: Diagnosis not present

## 2022-04-16 DIAGNOSIS — I252 Old myocardial infarction: Secondary | ICD-10-CM | POA: Diagnosis not present

## 2022-04-16 DIAGNOSIS — E1122 Type 2 diabetes mellitus with diabetic chronic kidney disease: Secondary | ICD-10-CM | POA: Diagnosis not present

## 2022-04-16 DIAGNOSIS — Z853 Personal history of malignant neoplasm of breast: Secondary | ICD-10-CM | POA: Diagnosis not present

## 2022-04-16 DIAGNOSIS — Z794 Long term (current) use of insulin: Secondary | ICD-10-CM | POA: Diagnosis not present

## 2022-04-16 DIAGNOSIS — F32A Depression, unspecified: Secondary | ICD-10-CM | POA: Diagnosis not present

## 2022-04-16 DIAGNOSIS — I130 Hypertensive heart and chronic kidney disease with heart failure and stage 1 through stage 4 chronic kidney disease, or unspecified chronic kidney disease: Secondary | ICD-10-CM | POA: Diagnosis not present

## 2022-04-16 DIAGNOSIS — I255 Ischemic cardiomyopathy: Secondary | ICD-10-CM | POA: Diagnosis not present

## 2022-04-16 DIAGNOSIS — E785 Hyperlipidemia, unspecified: Secondary | ICD-10-CM | POA: Diagnosis not present

## 2022-04-16 DIAGNOSIS — K219 Gastro-esophageal reflux disease without esophagitis: Secondary | ICD-10-CM | POA: Diagnosis not present

## 2022-04-16 DIAGNOSIS — M19049 Primary osteoarthritis, unspecified hand: Secondary | ICD-10-CM | POA: Diagnosis not present

## 2022-04-16 DIAGNOSIS — I5020 Unspecified systolic (congestive) heart failure: Secondary | ICD-10-CM | POA: Diagnosis not present

## 2022-04-16 MED ORDER — APIXABAN 2.5 MG PO TABS: 2.5000 mg | ORAL_TABLET | Freq: Two times a day (BID) | ORAL | 4 refills | Status: DC

## 2022-04-17 NOTE — Telephone Encounter (Signed)
Patient called and informed that forms are ready for pick up. Copy made and placed in batch scanning. Original placed at front desk for pick up.   Jordie Skalsky C Anallely Rosell, RN  

## 2022-04-22 DIAGNOSIS — I25119 Atherosclerotic heart disease of native coronary artery with unspecified angina pectoris: Secondary | ICD-10-CM | POA: Diagnosis not present

## 2022-04-22 DIAGNOSIS — I5020 Unspecified systolic (congestive) heart failure: Secondary | ICD-10-CM | POA: Diagnosis not present

## 2022-04-22 DIAGNOSIS — I480 Paroxysmal atrial fibrillation: Secondary | ICD-10-CM | POA: Diagnosis not present

## 2022-04-22 DIAGNOSIS — G4700 Insomnia, unspecified: Secondary | ICD-10-CM | POA: Diagnosis not present

## 2022-04-22 DIAGNOSIS — K219 Gastro-esophageal reflux disease without esophagitis: Secondary | ICD-10-CM | POA: Diagnosis not present

## 2022-04-22 DIAGNOSIS — Z86711 Personal history of pulmonary embolism: Secondary | ICD-10-CM | POA: Diagnosis not present

## 2022-04-22 DIAGNOSIS — Z7901 Long term (current) use of anticoagulants: Secondary | ICD-10-CM | POA: Diagnosis not present

## 2022-04-22 DIAGNOSIS — N1831 Chronic kidney disease, stage 3a: Secondary | ICD-10-CM | POA: Diagnosis not present

## 2022-04-22 DIAGNOSIS — I130 Hypertensive heart and chronic kidney disease with heart failure and stage 1 through stage 4 chronic kidney disease, or unspecified chronic kidney disease: Secondary | ICD-10-CM | POA: Diagnosis not present

## 2022-04-22 DIAGNOSIS — E1122 Type 2 diabetes mellitus with diabetic chronic kidney disease: Secondary | ICD-10-CM | POA: Diagnosis not present

## 2022-04-22 DIAGNOSIS — F32A Depression, unspecified: Secondary | ICD-10-CM | POA: Diagnosis not present

## 2022-04-22 DIAGNOSIS — Z794 Long term (current) use of insulin: Secondary | ICD-10-CM | POA: Diagnosis not present

## 2022-04-22 DIAGNOSIS — I252 Old myocardial infarction: Secondary | ICD-10-CM | POA: Diagnosis not present

## 2022-04-22 DIAGNOSIS — M8580 Other specified disorders of bone density and structure, unspecified site: Secondary | ICD-10-CM | POA: Diagnosis not present

## 2022-04-22 DIAGNOSIS — I255 Ischemic cardiomyopathy: Secondary | ICD-10-CM | POA: Diagnosis not present

## 2022-04-22 DIAGNOSIS — Z9181 History of falling: Secondary | ICD-10-CM | POA: Diagnosis not present

## 2022-04-22 DIAGNOSIS — E785 Hyperlipidemia, unspecified: Secondary | ICD-10-CM | POA: Diagnosis not present

## 2022-04-22 DIAGNOSIS — Z853 Personal history of malignant neoplasm of breast: Secondary | ICD-10-CM | POA: Diagnosis not present

## 2022-04-22 DIAGNOSIS — M19049 Primary osteoarthritis, unspecified hand: Secondary | ICD-10-CM | POA: Diagnosis not present

## 2022-04-22 DIAGNOSIS — Z7982 Long term (current) use of aspirin: Secondary | ICD-10-CM | POA: Diagnosis not present

## 2022-04-23 DIAGNOSIS — I480 Paroxysmal atrial fibrillation: Secondary | ICD-10-CM | POA: Diagnosis not present

## 2022-04-23 DIAGNOSIS — M8580 Other specified disorders of bone density and structure, unspecified site: Secondary | ICD-10-CM | POA: Diagnosis not present

## 2022-04-23 DIAGNOSIS — I25119 Atherosclerotic heart disease of native coronary artery with unspecified angina pectoris: Secondary | ICD-10-CM | POA: Diagnosis not present

## 2022-04-23 DIAGNOSIS — Z9181 History of falling: Secondary | ICD-10-CM | POA: Diagnosis not present

## 2022-04-23 DIAGNOSIS — I130 Hypertensive heart and chronic kidney disease with heart failure and stage 1 through stage 4 chronic kidney disease, or unspecified chronic kidney disease: Secondary | ICD-10-CM | POA: Diagnosis not present

## 2022-04-23 DIAGNOSIS — Z794 Long term (current) use of insulin: Secondary | ICD-10-CM | POA: Diagnosis not present

## 2022-04-23 DIAGNOSIS — Z86711 Personal history of pulmonary embolism: Secondary | ICD-10-CM | POA: Diagnosis not present

## 2022-04-23 DIAGNOSIS — G4700 Insomnia, unspecified: Secondary | ICD-10-CM | POA: Diagnosis not present

## 2022-04-23 DIAGNOSIS — M19049 Primary osteoarthritis, unspecified hand: Secondary | ICD-10-CM | POA: Diagnosis not present

## 2022-04-23 DIAGNOSIS — I255 Ischemic cardiomyopathy: Secondary | ICD-10-CM | POA: Diagnosis not present

## 2022-04-23 DIAGNOSIS — E785 Hyperlipidemia, unspecified: Secondary | ICD-10-CM | POA: Diagnosis not present

## 2022-04-23 DIAGNOSIS — Z853 Personal history of malignant neoplasm of breast: Secondary | ICD-10-CM | POA: Diagnosis not present

## 2022-04-23 DIAGNOSIS — F32A Depression, unspecified: Secondary | ICD-10-CM | POA: Diagnosis not present

## 2022-04-23 DIAGNOSIS — E1122 Type 2 diabetes mellitus with diabetic chronic kidney disease: Secondary | ICD-10-CM | POA: Diagnosis not present

## 2022-04-23 DIAGNOSIS — K219 Gastro-esophageal reflux disease without esophagitis: Secondary | ICD-10-CM | POA: Diagnosis not present

## 2022-04-23 DIAGNOSIS — I5020 Unspecified systolic (congestive) heart failure: Secondary | ICD-10-CM | POA: Diagnosis not present

## 2022-04-23 DIAGNOSIS — N1831 Chronic kidney disease, stage 3a: Secondary | ICD-10-CM | POA: Diagnosis not present

## 2022-04-23 DIAGNOSIS — Z7901 Long term (current) use of anticoagulants: Secondary | ICD-10-CM | POA: Diagnosis not present

## 2022-04-23 DIAGNOSIS — Z7982 Long term (current) use of aspirin: Secondary | ICD-10-CM | POA: Diagnosis not present

## 2022-04-23 DIAGNOSIS — I252 Old myocardial infarction: Secondary | ICD-10-CM | POA: Diagnosis not present

## 2022-04-25 DIAGNOSIS — I509 Heart failure, unspecified: Secondary | ICD-10-CM | POA: Diagnosis not present

## 2022-04-28 ENCOUNTER — Encounter (HOSPITAL_COMMUNITY): Payer: Medicare Other

## 2022-04-29 DIAGNOSIS — E1122 Type 2 diabetes mellitus with diabetic chronic kidney disease: Secondary | ICD-10-CM | POA: Diagnosis not present

## 2022-04-29 DIAGNOSIS — I255 Ischemic cardiomyopathy: Secondary | ICD-10-CM | POA: Diagnosis not present

## 2022-04-29 DIAGNOSIS — Z7982 Long term (current) use of aspirin: Secondary | ICD-10-CM | POA: Diagnosis not present

## 2022-04-29 DIAGNOSIS — I480 Paroxysmal atrial fibrillation: Secondary | ICD-10-CM | POA: Diagnosis not present

## 2022-04-29 DIAGNOSIS — I130 Hypertensive heart and chronic kidney disease with heart failure and stage 1 through stage 4 chronic kidney disease, or unspecified chronic kidney disease: Secondary | ICD-10-CM | POA: Diagnosis not present

## 2022-04-29 DIAGNOSIS — G4700 Insomnia, unspecified: Secondary | ICD-10-CM | POA: Diagnosis not present

## 2022-04-29 DIAGNOSIS — I5020 Unspecified systolic (congestive) heart failure: Secondary | ICD-10-CM | POA: Diagnosis not present

## 2022-04-29 DIAGNOSIS — F32A Depression, unspecified: Secondary | ICD-10-CM | POA: Diagnosis not present

## 2022-04-29 DIAGNOSIS — Z9181 History of falling: Secondary | ICD-10-CM | POA: Diagnosis not present

## 2022-04-29 DIAGNOSIS — E785 Hyperlipidemia, unspecified: Secondary | ICD-10-CM | POA: Diagnosis not present

## 2022-04-29 DIAGNOSIS — K219 Gastro-esophageal reflux disease without esophagitis: Secondary | ICD-10-CM | POA: Diagnosis not present

## 2022-04-29 DIAGNOSIS — Z853 Personal history of malignant neoplasm of breast: Secondary | ICD-10-CM | POA: Diagnosis not present

## 2022-04-29 DIAGNOSIS — I25119 Atherosclerotic heart disease of native coronary artery with unspecified angina pectoris: Secondary | ICD-10-CM | POA: Diagnosis not present

## 2022-04-29 DIAGNOSIS — Z794 Long term (current) use of insulin: Secondary | ICD-10-CM | POA: Diagnosis not present

## 2022-04-29 DIAGNOSIS — N1831 Chronic kidney disease, stage 3a: Secondary | ICD-10-CM | POA: Diagnosis not present

## 2022-04-29 DIAGNOSIS — M8580 Other specified disorders of bone density and structure, unspecified site: Secondary | ICD-10-CM | POA: Diagnosis not present

## 2022-04-29 DIAGNOSIS — I252 Old myocardial infarction: Secondary | ICD-10-CM | POA: Diagnosis not present

## 2022-04-29 DIAGNOSIS — Z86711 Personal history of pulmonary embolism: Secondary | ICD-10-CM | POA: Diagnosis not present

## 2022-04-29 DIAGNOSIS — Z7901 Long term (current) use of anticoagulants: Secondary | ICD-10-CM | POA: Diagnosis not present

## 2022-04-29 DIAGNOSIS — M19049 Primary osteoarthritis, unspecified hand: Secondary | ICD-10-CM | POA: Diagnosis not present

## 2022-04-29 NOTE — Progress Notes (Signed)
ADVANCED HF CLINIC NOTE  Primary Care: Darci Current, DO Primary Cardiologist: Dr. Terrence Dupont HF Cardiologist: Dr. Haroldine Laws  HPI: Dana Coleman is a 81 y.o. female w/ h/o CAD, s/p remote inferior wall MI in 2000 treated w/ PCI to RCA, hypertension, type 2DM, HLD and h/o right sided breast cancer treated w/ radiation and mastectomy, no chemotherapy.    Followed by Dr. Terrence Dupont Echo 2019 EF 55-60%, G1D, RV normal  NST 2019 c/w prior inferior myocardial infarction. Small mild distal anterior/apical infarct with mild peri-infarct ischemia>> Low risk study    She was extremely sick 04/2021 w/ nearly 6 wk hospitalization. She had laparoscopic cholecystectomy on 05/02/21, discharged home, developed abdominal pain associated with sudden onset of altered mental status noted to be hypotensive, bradycardic and hypoxic by EMS requiring intubation and subsequently was noted to have a right empyema and perforation of the esophagus requiring emergent thoracotomy w/ repair of esophageal perforation with intercostal pedicled muscle flap, drainage of empyema and decortication of right lung on 7/24.    7/25, she developed EKG changes w/ minimal inferior ST elevation and troponin leak. Echo showed moderately reduced LVEF, 40%, and severely reduced RV systolic function. However given recent surgery, she was not a candidate for intervention. She later developed increased O2 requirements, subsequently had CT angiogram which showed bilateral pulmonary embolism.  Repeat limited echo showed EF 25-30%, RV normal. No evidence of significant strain>>>IV heparin>>Eliquis. Hospitalization also notable for atrial flutter. Dr. Terrence Dupont followed during hospitalization.    Of note EKG 1/23 showed afib w/ CVR. F/u EKGs 3/23 showed NSR.   Admit 3/23 for a/c CHF. Remained in NSR during admission. Echo EF 25-30%, RV moderately reduced right ventricular systolic pressure 92.3 mmHg, mild MR. Diuresed 9 lb down to a dry wt of 119 lb.  Discharged home on lasix, Entresto and Toprol XL. Referred to [facility] clinic. Seen in [facility] 4/23, remained volume overloaded, Entresto increased to goal dose and Lasix started.   Follow up 5/23, NYHA II-early III, volume stable.  Admitted 6/23 with CP, cardiology consulted. CHF compensated and underwent nuclear stress test which showed LVEF 25%, large fixed defect involving apical and inferior myocardium consistent with old infarction. Hospitalization c/b dizziness and low BP and hydralazine was stopped, Entresto reduced to 24/26. She was discharged home with [facility] PT/OT/RN, weight 118 lbs.  Today she returns for HF follow up with her family member. Overall feeling fine. No further dizziness, no recent falls. She is not SOB walking on flat ground, uses her cane for longer distances. Denies palpitations, abnormal bleeding, CP, edema, or PND/Orthopnea. Appetite ok. No fever or chills. Weight at home 114 pounds. Taking all medications. She lives alone but her siblings live down the street and help her out. She has HH PT/OT/RN.  Cardiac Testing    - Echo (3/19): EF 55-60%, G1D, RV normal    - NST 12/2017  There was no ST segment deviation noted during stress. T wave inversion was noted during stress in the III leads, beginning at 1 minutes of stress. T wave inversion persisted. Findings consistent with prior inferior myocardial infarction. Small mild distal anterior/apical infarct with mild peri-infarct ischemia. This is a low risk study. Mild area of current ischemia. The left ventricular ejection fraction is normal (55-65%).     - Echo (7/22): EF 45%, RV severely reduced   - Ltd Echo (8/22): EF 25-30%, RV normal   - Echo (3/23): EF 25-30%, RV moderately reduced right ventricular systolic pressure is 30.0 mmHg, mild  MR   - Lexiscan myoview (6/23): LVEF 25%, large fixed defect apical and inferior myocardium, consistent with old infarction.     Past Medical History:  Diagnosis Date   Allergy    occ  uses OTC allergy meds    Arthritis    fingers   Breast cancer ([place]) 04/2008   Right breast   Cataracts, bilateral    removed bilat    Cervical cancer ([place])    When the patient was in her 39s   Depression    Diabetes mellitus without complication ([place])    Empyema ([place])    Esophageal perforation 05/05/2021   Failure to thrive in adult 07/22/2021   Wt Readings from Last 3 Encounters: 06/26/21 65.8 kg 06/17/21 64.8 kg 04/26/21 58.1 kg     Gastrostomy tube in place [facility]) 08/21/2021   Heart attack ([place]) 2004   History of cervical cancer    S/p radiation and surgical removal in 30's.  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 Truddie Coco   PEG (percutaneous endoscopic gastrostomy) status ([place]) 07/23/2021   Personal history of radiation therapy 2009   Pressure injury of skin 05/05/2021   Protein-calorie malnutrition, severe 05/15/2021   Pyelonephritis 11/06/2021   Respiratory failure ([place]) 05/04/2021   S/P thoracotomy    UTI (urinary tract infection) 11/05/2021    Current Outpatient Medications  Medication Sig Dispense Refill   acetaminophen (TYLENOL) 500 MG tablet Take 500 mg by mouth every 6 (six) hours as needed for moderate pain.     apixaban (ELIQUIS) 2.5 MG TABS tablet Take 1 tablet (2.5 mg total) by mouth 2 (two) times daily. 60 tablet 4   aspirin EC 81 MG tablet Take 81 mg by mouth in the morning. Swallow whole.     Blood Glucose Monitoring Suppl MISC daily.     furosemide (LASIX) 80 MG tablet Take 0.5 tablets (40 mg total) by mouth daily as needed. 30 tablet 3   glucose blood (ONE TOUCH ULTRA TEST) test strip CHECK BLOOD SUGAR TWICE DAILY 100 each 1   insulin glargine (LANTUS SOLOSTAR) 100 UNIT/ML Solostar Pen Inject 6 Units into  the skin in the morning.     Insulin Pen Needle (B-D UF III MINI PEN NEEDLES) 31G X 5 MM MISC INJECT INSULIN VIA PEN 6 TIMES DAILY 200 each 3   isosorbide mononitrate (IMDUR) 30 MG 24 hr tablet TAKE 1/2 OF A TABLET (15 MG TOTAL) BY MOUTH DAILY 45 tablet 1   Melatonin 3 MG TBDP Take 6 mg by mouth at bedtime as needed (sleep).     metoprolol succinate (TOPROL-XL) 25 MG 24 hr tablet Take 0.5 tablets (12.5 mg total) by mouth daily. 30 tablet 6   omeprazole (PRILOSEC) 20 MG capsule Take 40 mg by mouth every morning.     ondansetron (ZOFRAN) 4 MG tablet Take 1 tablet (4 mg total) by mouth daily as needed for nausea or vomiting. 30 tablet 1   OneTouch Delica Lancets 44R MISC 1 Container by Does not apply route as needed. 100 each PRN   rosuvastatin (CRESTOR) 20 MG tablet TAKE 1 TABLET BY MOUTH EVERYDAY AT BEDTIME 90 tablet 1   sacubitril-valsartan (ENTRESTO) 24-26 MG Take 1  tablet by mouth 2 (two) times daily. 60 tablet 2   spironolactone (ALDACTONE) 25 MG tablet Take 0.5 tablets (12.5 mg total) by mouth daily. 90 tablet 1   traZODone (DESYREL) 50 MG tablet Take 50 mg by mouth at bedtime as needed for sleep.     No current facility-administered medications for this encounter.   No Known Allergies  Social History   Socioeconomic History   Marital status: Divorced    Spouse name: Not on file   Number of children: 5   Years of education: 10   Highest education level: 10th grade  Occupational History   Occupation: Retired- IT trainer  Tobacco Use   Smoking status: Never   Smokeless tobacco: Never   Tobacco comments:    No plans to start  Vaping Use   Vaping Use: Never used  Substance and Sexual Activity   Alcohol use: No   Drug use: No   Sexual activity: Not Currently    Birth control/protection: Condom  Other Topics Concern   Not on file  Social History Narrative   Patient lives alone in [place].   Patient is close with daughters Solmon Ice and [place]. One Son in [place]. 2 children have passed.    Patient depends on her sister and brother for support and any assistance needed.    [name] (sister) [phone].   Religious / Personal Beliefs: Baptist   Patient does not drive.   Diet: Pt has a varied diet of protein, starch, and vegetables and fruits.   Seatbelts: Pt reports wearing seatbelt when in vehicles.    Hobbies: shopping, visiting family, church   Education / Work:  10th grade/ [employer] Strain: [place]  (10/17/2021)   Overall Financial Resource Strain (CARDIA)    Difficulty of Paying Living Expenses: Not hard at all  Food Insecurity: No Food Insecurity (10/17/2021)   Hunger Vital Sign    Worried About Running Out of Food in the Last Year: Never true    [place] in the Last Year: Never true  Transportation Needs: No Transportation Needs (10/17/2021)   PRAPARE - Hydrologist (Medical): No    Lack of Transportation (Non-Medical): No  Physical Activity: Insufficiently Active (10/17/2021)   Exercise Vital Sign    Days of Exercise per Week: 1 day    Minutes of Exercise per Session: 40 min  Stress: No Stress Concern Present (10/17/2021)   [place]    Feeling of Stress : Not at all  Social Connections: Moderately Isolated (10/17/2021)   Social Connection and Isolation Panel [NHANES]    Frequency of Communication with Friends and Family: More than three times a week    Frequency of Social Gatherings with Friends and Family: More than three times a week    Attends Religious Services: More than 4 times per year    Active Member of [employer] or Organizations: No    Attends Archivist Meetings: Never    Marital Status: Divorced  Human resources officer Violence: Not At  Risk (10/17/2021)   Humiliation, Afraid, Rape, and Kick  questionnaire    Fear of Current or Ex-Partner: No    Emotionally Abused: No    Physically Abused: No    Sexually Abused: No   Family History  Problem Relation Age of Onset   Heart disease Mother        age 96   Heart disease Father    Cancer Father        unknown   Heart disease Brother    Hypertension Brother    Cirrhosis Brother    Diabetes Daughter    Stroke Daughter    Kidney disease Daughter    Hypertension Son    Colon cancer Neg Hx    Colon polyps Neg Hx    Esophageal cancer Neg Hx    Stomach cancer Neg Hx    Rectal cancer Neg Hx    BP 124/72   Pulse 66   Wt 53.5 kg (118 lb)   SpO2 97%   BMI 21.58 kg/m   Wt Readings from Last 3 Encounters:  04/30/22 53.5 kg (118 lb)  04/03/22 53 kg (116 lb 12.8 oz)  03/27/22 55.5 kg (122 lb 5.7 oz)   PHYSICAL EXAM: General:  NAD. No resp difficulty, thin, walked into clinic with cane. HEENT: Normal Neck: Supple. No JVD. Carotids 2+ bilat; no bruits. No lymphadenopathy or thryomegaly appreciated. Cor: PMI nondisplaced. Regular rate & rhythm. No rubs, gallops or murmurs. Lungs: Clear Abdomen: Soft, nontender, nondistended. No hepatosplenomegaly. No bruits or masses. Good bowel sounds. Extremities: No cyanosis, clubbing, rash, edema Neuro: Alert & oriented x 3, cranial nerves grossly intact. Moves all 4 extremities w/o difficulty. Affect pleasant.  ASSESSMENT & PLAN: Chronic Biventricular Heart Failure - Etiology uncertain, possible ischemic CM given known h/o CAD, though no recent CP. Remote inferior MI in 2000 w/ RCA PCI. Cath report unavailable, uncertain if any additional disease  - Echo (2019): showed normal LVEF, 55-60%, and normal RV - drop in EF 7/22, down to 25-30% w/ mod-severely reduced RV, in the setting of severe illness from septic shock, hypoxic respiratory failure, in setting of rt lung empyema and esophageal perforation requiring emergency surgery, c/b perioperative PE and NSTEMI (medically managed in setting  of severe illness) - Echo (3/23): EF 25-30%, RV moderately reduced  - NYHA II-early III, functional class confounded by general physical deconditioning. Volume looks OK today on exam. - Continue spiro 12.5 mg daily. - Continue Lasix 40 mg PRN. - Continue Entresto 24/26 mg bid. Recently reduced with low BP - Continue Toprol XL 12.5 mg daily. No titration w/ bradycardia. - Continue Imdur 15 mg daily. Off hydralazine with low BP. - Intolerant of SGLT2i due to UTIs (previously tried [place]). - No digoxin w/ bradycardia.  - Could consider R/LHC if EF does not improve w/ optimization of meds.  - BMET today.   2. CAD  - h/o CAD, s/p remote inferior wall MI in 2000 treated w/ PCI to RCA - NST 2019 c/w prior inferior myocardial infarction. Small mild distal anterior/apical infarct with mild peri-infarct ischemia>> Low risk study  - Recent admission for CP. Lexiscan (6/23) showed EF 25%, large fixed defect involving apical and inferior myocardium consistent with old infarction.  - Patient followed by Dr. Terrence Dupont, medical management for now. - Continue ASA, statin and ? blocker.   3. PAF - Continue Toprol XL.  - Continue Eliquis, no bleeding issues.   4. H/o PE - On Eliquis.    5. Hypertension  -  Improving. Meds recently cut back due to low BP. - GDMT as above.   6. Type 2DM  - Managed by PCP - On insulin - Failed SGLT2i due to frequent UTIs    Follow up in 2 months with Dr. Haroldine Laws + echo.  Allena Katz, FNP-BC 04/30/22

## 2022-04-30 ENCOUNTER — Other Ambulatory Visit (HOSPITAL_COMMUNITY): Payer: Self-pay | Admitting: Cardiology

## 2022-04-30 ENCOUNTER — Encounter (HOSPITAL_COMMUNITY): Payer: Self-pay

## 2022-04-30 ENCOUNTER — Ambulatory Visit (HOSPITAL_COMMUNITY)
Admission: RE | Admit: 2022-04-30 | Discharge: 2022-04-30 | Disposition: A | Discharge status: Home / Self Care | Payer: Medicare Other | Source: Ambulatory Visit | Attending: Family Medicine | Admitting: Family Medicine

## 2022-04-30 VITALS — BP 124/72 | HR 66 | Wt 118.0 lb

## 2022-04-30 DIAGNOSIS — I5082 Biventricular heart failure: Secondary | ICD-10-CM | POA: Diagnosis not present

## 2022-04-30 DIAGNOSIS — Z86711 Personal history of pulmonary embolism: Secondary | ICD-10-CM

## 2022-04-30 DIAGNOSIS — Z87440 Personal history of urinary (tract) infections: Secondary | ICD-10-CM | POA: Insufficient documentation

## 2022-04-30 DIAGNOSIS — Z955 Presence of coronary angioplasty implant and graft: Secondary | ICD-10-CM | POA: Diagnosis not present

## 2022-04-30 DIAGNOSIS — N1831 Chronic kidney disease, stage 3a: Secondary | ICD-10-CM | POA: Diagnosis not present

## 2022-04-30 DIAGNOSIS — I255 Ischemic cardiomyopathy: Secondary | ICD-10-CM | POA: Diagnosis not present

## 2022-04-30 DIAGNOSIS — I10 Essential (primary) hypertension: Secondary | ICD-10-CM

## 2022-04-30 DIAGNOSIS — I2510 Atherosclerotic heart disease of native coronary artery without angina pectoris: Secondary | ICD-10-CM | POA: Diagnosis not present

## 2022-04-30 DIAGNOSIS — I110 Hypertensive heart disease with heart failure: Secondary | ICD-10-CM | POA: Diagnosis not present

## 2022-04-30 DIAGNOSIS — Z8249 Family history of ischemic heart disease and other diseases of the circulatory system: Secondary | ICD-10-CM | POA: Insufficient documentation

## 2022-04-30 DIAGNOSIS — E119 Type 2 diabetes mellitus without complications: Secondary | ICD-10-CM | POA: Diagnosis not present

## 2022-04-30 DIAGNOSIS — Z9011 Acquired absence of right breast and nipple: Secondary | ICD-10-CM | POA: Diagnosis not present

## 2022-04-30 DIAGNOSIS — Z7902 Long term (current) use of antithrombotics/antiplatelets: Secondary | ICD-10-CM | POA: Insufficient documentation

## 2022-04-30 DIAGNOSIS — K219 Gastro-esophageal reflux disease without esophagitis: Secondary | ICD-10-CM | POA: Diagnosis not present

## 2022-04-30 DIAGNOSIS — Z923 Personal history of irradiation: Secondary | ICD-10-CM | POA: Insufficient documentation

## 2022-04-30 DIAGNOSIS — Z853 Personal history of malignant neoplasm of breast: Secondary | ICD-10-CM | POA: Insufficient documentation

## 2022-04-30 DIAGNOSIS — I25119 Atherosclerotic heart disease of native coronary artery with unspecified angina pectoris: Secondary | ICD-10-CM | POA: Diagnosis not present

## 2022-04-30 DIAGNOSIS — I480 Paroxysmal atrial fibrillation: Secondary | ICD-10-CM | POA: Diagnosis not present

## 2022-04-30 DIAGNOSIS — Z79899 Other long term (current) drug therapy: Secondary | ICD-10-CM | POA: Insufficient documentation

## 2022-04-30 DIAGNOSIS — Z9181 History of falling: Secondary | ICD-10-CM | POA: Diagnosis not present

## 2022-04-30 DIAGNOSIS — Z7901 Long term (current) use of anticoagulants: Secondary | ICD-10-CM | POA: Diagnosis not present

## 2022-04-30 DIAGNOSIS — Z7982 Long term (current) use of aspirin: Secondary | ICD-10-CM | POA: Diagnosis not present

## 2022-04-30 DIAGNOSIS — E785 Hyperlipidemia, unspecified: Secondary | ICD-10-CM | POA: Diagnosis not present

## 2022-04-30 DIAGNOSIS — I5020 Unspecified systolic (congestive) heart failure: Secondary | ICD-10-CM | POA: Diagnosis not present

## 2022-04-30 DIAGNOSIS — M19049 Primary osteoarthritis, unspecified hand: Secondary | ICD-10-CM | POA: Diagnosis not present

## 2022-04-30 DIAGNOSIS — E1122 Type 2 diabetes mellitus with diabetic chronic kidney disease: Secondary | ICD-10-CM | POA: Diagnosis not present

## 2022-04-30 DIAGNOSIS — I252 Old myocardial infarction: Secondary | ICD-10-CM | POA: Insufficient documentation

## 2022-04-30 DIAGNOSIS — I130 Hypertensive heart and chronic kidney disease with heart failure and stage 1 through stage 4 chronic kidney disease, or unspecified chronic kidney disease: Secondary | ICD-10-CM | POA: Diagnosis not present

## 2022-04-30 DIAGNOSIS — F32A Depression, unspecified: Secondary | ICD-10-CM | POA: Diagnosis not present

## 2022-04-30 DIAGNOSIS — Z794 Long term (current) use of insulin: Secondary | ICD-10-CM | POA: Insufficient documentation

## 2022-04-30 DIAGNOSIS — I5022 Chronic systolic (congestive) heart failure: Secondary | ICD-10-CM

## 2022-04-30 DIAGNOSIS — M8580 Other specified disorders of bone density and structure, unspecified site: Secondary | ICD-10-CM | POA: Diagnosis not present

## 2022-04-30 DIAGNOSIS — G4700 Insomnia, unspecified: Secondary | ICD-10-CM | POA: Diagnosis not present

## 2022-04-30 LAB — BASIC METABOLIC PANEL
Anion gap: 10 (ref 5–15)
BUN: 11 mg/dL (ref 8–23)
CO2: 27 mmol/L (ref 22–32)
Calcium: 9.6 mg/dL (ref 8.9–10.3)
Chloride: 102 mmol/L (ref 98–111)
Creatinine, Ser: 1.23 mg/dL — ABNORMAL HIGH (ref 0.44–1.00)
GFR, Estimated: 44 mL/min — ABNORMAL LOW (ref >60)
Glucose, Bld: 153 mg/dL — ABNORMAL HIGH (ref 70–99)
Potassium: 4.7 mmol/L (ref 3.5–5.1)
Sodium: 139 mmol/L (ref 135–145)

## 2022-04-30 NOTE — Patient Instructions (Addendum)
Thank you for coming in today  Labs were done today, if any labs are abnormal the clinic will call you No news is good news  Your physician recommends that you schedule a follow-up appointment in:  Keep follow up with Dr. Haroldine Laws    Do the following things EVERYDAY: Weigh yourself in the morning before breakfast. Write it down and keep it in a log. Take your medicines as prescribed Eat low salt foods--Limit salt (sodium) to 2000 mg per day.  Stay as active as you can everyday Limit all fluids for the day to less than 2 liters  At the [facility], you and your health needs are our priority. As part of our continuing mission to provide you with exceptional heart care, we have created designated Provider Care Teams. These Care Teams include your primary Cardiologist (physician) and Advanced Practice Providers (APPs- Physician Assistants and Nurse Practitioners) who all work together to provide you with the care you need, when you need it.   You may see any of the following providers on your designated Care Team at your next follow up: Dr Glori Bickers Dr Haynes Kerns, NP Lyda Jester, [place] [facility] [place], [place] Audry Riles, PharmD   Please be sure to bring in all your medications bottles to every appointment.   If you have any questions or concerns before your next appointment please send [place] a message through [place] or call our office at [phone].    TO LEAVE A MESSAGE FOR THE NURSE SELECT OPTION 2, PLEASE LEAVE A MESSAGE INCLUDING: YOUR NAME DATE OF BIRTH CALL BACK NUMBER REASON FOR CALL**this is important as we prioritize the call backs  YOU WILL RECEIVE A CALL BACK THE SAME DAY AS LONG AS YOU CALL BEFORE 4:00 PM

## 2022-05-01 DIAGNOSIS — I130 Hypertensive heart and chronic kidney disease with heart failure and stage 1 through stage 4 chronic kidney disease, or unspecified chronic kidney disease: Secondary | ICD-10-CM | POA: Diagnosis not present

## 2022-05-01 DIAGNOSIS — Z7982 Long term (current) use of aspirin: Secondary | ICD-10-CM | POA: Diagnosis not present

## 2022-05-01 DIAGNOSIS — I25119 Atherosclerotic heart disease of native coronary artery with unspecified angina pectoris: Secondary | ICD-10-CM | POA: Diagnosis not present

## 2022-05-01 DIAGNOSIS — E1122 Type 2 diabetes mellitus with diabetic chronic kidney disease: Secondary | ICD-10-CM | POA: Diagnosis not present

## 2022-05-01 DIAGNOSIS — N1831 Chronic kidney disease, stage 3a: Secondary | ICD-10-CM | POA: Diagnosis not present

## 2022-05-01 DIAGNOSIS — Z794 Long term (current) use of insulin: Secondary | ICD-10-CM | POA: Diagnosis not present

## 2022-05-01 DIAGNOSIS — I255 Ischemic cardiomyopathy: Secondary | ICD-10-CM | POA: Diagnosis not present

## 2022-05-01 DIAGNOSIS — I5020 Unspecified systolic (congestive) heart failure: Secondary | ICD-10-CM | POA: Diagnosis not present

## 2022-05-01 DIAGNOSIS — E785 Hyperlipidemia, unspecified: Secondary | ICD-10-CM | POA: Diagnosis not present

## 2022-05-01 DIAGNOSIS — Z853 Personal history of malignant neoplasm of breast: Secondary | ICD-10-CM | POA: Diagnosis not present

## 2022-05-01 DIAGNOSIS — Z7901 Long term (current) use of anticoagulants: Secondary | ICD-10-CM | POA: Diagnosis not present

## 2022-05-01 DIAGNOSIS — I252 Old myocardial infarction: Secondary | ICD-10-CM | POA: Diagnosis not present

## 2022-05-01 DIAGNOSIS — I480 Paroxysmal atrial fibrillation: Secondary | ICD-10-CM | POA: Diagnosis not present

## 2022-05-01 DIAGNOSIS — Z9181 History of falling: Secondary | ICD-10-CM | POA: Diagnosis not present

## 2022-05-01 DIAGNOSIS — G4700 Insomnia, unspecified: Secondary | ICD-10-CM | POA: Diagnosis not present

## 2022-05-01 DIAGNOSIS — Z86711 Personal history of pulmonary embolism: Secondary | ICD-10-CM | POA: Diagnosis not present

## 2022-05-01 DIAGNOSIS — M8580 Other specified disorders of bone density and structure, unspecified site: Secondary | ICD-10-CM | POA: Diagnosis not present

## 2022-05-01 DIAGNOSIS — K219 Gastro-esophageal reflux disease without esophagitis: Secondary | ICD-10-CM | POA: Diagnosis not present

## 2022-05-01 DIAGNOSIS — F32A Depression, unspecified: Secondary | ICD-10-CM | POA: Diagnosis not present

## 2022-05-01 DIAGNOSIS — M19049 Primary osteoarthritis, unspecified hand: Secondary | ICD-10-CM | POA: Diagnosis not present

## 2022-05-05 DIAGNOSIS — G4700 Insomnia, unspecified: Secondary | ICD-10-CM | POA: Diagnosis not present

## 2022-05-05 DIAGNOSIS — N1831 Chronic kidney disease, stage 3a: Secondary | ICD-10-CM | POA: Diagnosis not present

## 2022-05-05 DIAGNOSIS — I5020 Unspecified systolic (congestive) heart failure: Secondary | ICD-10-CM | POA: Diagnosis not present

## 2022-05-05 DIAGNOSIS — M19049 Primary osteoarthritis, unspecified hand: Secondary | ICD-10-CM | POA: Diagnosis not present

## 2022-05-05 DIAGNOSIS — Z86711 Personal history of pulmonary embolism: Secondary | ICD-10-CM | POA: Diagnosis not present

## 2022-05-05 DIAGNOSIS — I480 Paroxysmal atrial fibrillation: Secondary | ICD-10-CM | POA: Diagnosis not present

## 2022-05-05 DIAGNOSIS — E785 Hyperlipidemia, unspecified: Secondary | ICD-10-CM | POA: Diagnosis not present

## 2022-05-05 DIAGNOSIS — I252 Old myocardial infarction: Secondary | ICD-10-CM | POA: Diagnosis not present

## 2022-05-05 DIAGNOSIS — Z7901 Long term (current) use of anticoagulants: Secondary | ICD-10-CM | POA: Diagnosis not present

## 2022-05-05 DIAGNOSIS — I25119 Atherosclerotic heart disease of native coronary artery with unspecified angina pectoris: Secondary | ICD-10-CM | POA: Diagnosis not present

## 2022-05-05 DIAGNOSIS — I130 Hypertensive heart and chronic kidney disease with heart failure and stage 1 through stage 4 chronic kidney disease, or unspecified chronic kidney disease: Secondary | ICD-10-CM | POA: Diagnosis not present

## 2022-05-05 DIAGNOSIS — E1122 Type 2 diabetes mellitus with diabetic chronic kidney disease: Secondary | ICD-10-CM | POA: Diagnosis not present

## 2022-05-05 DIAGNOSIS — Z794 Long term (current) use of insulin: Secondary | ICD-10-CM | POA: Diagnosis not present

## 2022-05-05 DIAGNOSIS — Z853 Personal history of malignant neoplasm of breast: Secondary | ICD-10-CM | POA: Diagnosis not present

## 2022-05-05 DIAGNOSIS — Z9181 History of falling: Secondary | ICD-10-CM | POA: Diagnosis not present

## 2022-05-05 DIAGNOSIS — I255 Ischemic cardiomyopathy: Secondary | ICD-10-CM | POA: Diagnosis not present

## 2022-05-05 DIAGNOSIS — M8580 Other specified disorders of bone density and structure, unspecified site: Secondary | ICD-10-CM | POA: Diagnosis not present

## 2022-05-05 DIAGNOSIS — F32A Depression, unspecified: Secondary | ICD-10-CM | POA: Diagnosis not present

## 2022-05-05 DIAGNOSIS — K219 Gastro-esophageal reflux disease without esophagitis: Secondary | ICD-10-CM | POA: Diagnosis not present

## 2022-05-05 DIAGNOSIS — Z7982 Long term (current) use of aspirin: Secondary | ICD-10-CM | POA: Diagnosis not present

## 2022-05-06 DIAGNOSIS — M19049 Primary osteoarthritis, unspecified hand: Secondary | ICD-10-CM | POA: Diagnosis not present

## 2022-05-06 DIAGNOSIS — Z7901 Long term (current) use of anticoagulants: Secondary | ICD-10-CM | POA: Diagnosis not present

## 2022-05-06 DIAGNOSIS — I5020 Unspecified systolic (congestive) heart failure: Secondary | ICD-10-CM | POA: Diagnosis not present

## 2022-05-06 DIAGNOSIS — I480 Paroxysmal atrial fibrillation: Secondary | ICD-10-CM | POA: Diagnosis not present

## 2022-05-06 DIAGNOSIS — Z7982 Long term (current) use of aspirin: Secondary | ICD-10-CM | POA: Diagnosis not present

## 2022-05-06 DIAGNOSIS — K219 Gastro-esophageal reflux disease without esophagitis: Secondary | ICD-10-CM | POA: Diagnosis not present

## 2022-05-06 DIAGNOSIS — G4700 Insomnia, unspecified: Secondary | ICD-10-CM | POA: Diagnosis not present

## 2022-05-06 DIAGNOSIS — I130 Hypertensive heart and chronic kidney disease with heart failure and stage 1 through stage 4 chronic kidney disease, or unspecified chronic kidney disease: Secondary | ICD-10-CM | POA: Diagnosis not present

## 2022-05-06 DIAGNOSIS — M8580 Other specified disorders of bone density and structure, unspecified site: Secondary | ICD-10-CM | POA: Diagnosis not present

## 2022-05-06 DIAGNOSIS — Z86711 Personal history of pulmonary embolism: Secondary | ICD-10-CM | POA: Diagnosis not present

## 2022-05-06 DIAGNOSIS — F32A Depression, unspecified: Secondary | ICD-10-CM | POA: Diagnosis not present

## 2022-05-06 DIAGNOSIS — I25119 Atherosclerotic heart disease of native coronary artery with unspecified angina pectoris: Secondary | ICD-10-CM | POA: Diagnosis not present

## 2022-05-06 DIAGNOSIS — N1831 Chronic kidney disease, stage 3a: Secondary | ICD-10-CM | POA: Diagnosis not present

## 2022-05-06 DIAGNOSIS — Z794 Long term (current) use of insulin: Secondary | ICD-10-CM | POA: Diagnosis not present

## 2022-05-06 DIAGNOSIS — E785 Hyperlipidemia, unspecified: Secondary | ICD-10-CM | POA: Diagnosis not present

## 2022-05-06 DIAGNOSIS — I255 Ischemic cardiomyopathy: Secondary | ICD-10-CM | POA: Diagnosis not present

## 2022-05-06 DIAGNOSIS — E1122 Type 2 diabetes mellitus with diabetic chronic kidney disease: Secondary | ICD-10-CM | POA: Diagnosis not present

## 2022-05-06 DIAGNOSIS — Z853 Personal history of malignant neoplasm of breast: Secondary | ICD-10-CM | POA: Diagnosis not present

## 2022-05-06 DIAGNOSIS — I252 Old myocardial infarction: Secondary | ICD-10-CM | POA: Diagnosis not present

## 2022-05-06 DIAGNOSIS — Z9181 History of falling: Secondary | ICD-10-CM | POA: Diagnosis not present

## 2022-05-12 DIAGNOSIS — I5022 Chronic systolic (congestive) heart failure: Secondary | ICD-10-CM | POA: Diagnosis not present

## 2022-05-12 DIAGNOSIS — I2510 Atherosclerotic heart disease of native coronary artery without angina pectoris: Secondary | ICD-10-CM | POA: Diagnosis not present

## 2022-05-13 DIAGNOSIS — Z961 Presence of intraocular lens: Secondary | ICD-10-CM | POA: Diagnosis not present

## 2022-05-13 DIAGNOSIS — E119 Type 2 diabetes mellitus without complications: Secondary | ICD-10-CM | POA: Diagnosis not present

## 2022-05-13 DIAGNOSIS — H52203 Unspecified astigmatism, bilateral: Secondary | ICD-10-CM | POA: Diagnosis not present

## 2022-05-16 DIAGNOSIS — I255 Ischemic cardiomyopathy: Secondary | ICD-10-CM | POA: Diagnosis not present

## 2022-05-16 DIAGNOSIS — Z853 Personal history of malignant neoplasm of breast: Secondary | ICD-10-CM | POA: Diagnosis not present

## 2022-05-16 DIAGNOSIS — M19049 Primary osteoarthritis, unspecified hand: Secondary | ICD-10-CM | POA: Diagnosis not present

## 2022-05-16 DIAGNOSIS — Z794 Long term (current) use of insulin: Secondary | ICD-10-CM | POA: Diagnosis not present

## 2022-05-16 DIAGNOSIS — G4700 Insomnia, unspecified: Secondary | ICD-10-CM | POA: Diagnosis not present

## 2022-05-16 DIAGNOSIS — I252 Old myocardial infarction: Secondary | ICD-10-CM | POA: Diagnosis not present

## 2022-05-16 DIAGNOSIS — Z86711 Personal history of pulmonary embolism: Secondary | ICD-10-CM | POA: Diagnosis not present

## 2022-05-16 DIAGNOSIS — Z9181 History of falling: Secondary | ICD-10-CM | POA: Diagnosis not present

## 2022-05-16 DIAGNOSIS — I5020 Unspecified systolic (congestive) heart failure: Secondary | ICD-10-CM | POA: Diagnosis not present

## 2022-05-16 DIAGNOSIS — N1831 Chronic kidney disease, stage 3a: Secondary | ICD-10-CM | POA: Diagnosis not present

## 2022-05-16 DIAGNOSIS — I25119 Atherosclerotic heart disease of native coronary artery with unspecified angina pectoris: Secondary | ICD-10-CM | POA: Diagnosis not present

## 2022-05-16 DIAGNOSIS — Z7982 Long term (current) use of aspirin: Secondary | ICD-10-CM | POA: Diagnosis not present

## 2022-05-16 DIAGNOSIS — I480 Paroxysmal atrial fibrillation: Secondary | ICD-10-CM | POA: Diagnosis not present

## 2022-05-16 DIAGNOSIS — E785 Hyperlipidemia, unspecified: Secondary | ICD-10-CM | POA: Diagnosis not present

## 2022-05-16 DIAGNOSIS — K219 Gastro-esophageal reflux disease without esophagitis: Secondary | ICD-10-CM | POA: Diagnosis not present

## 2022-05-16 DIAGNOSIS — F32A Depression, unspecified: Secondary | ICD-10-CM | POA: Diagnosis not present

## 2022-05-16 DIAGNOSIS — E1122 Type 2 diabetes mellitus with diabetic chronic kidney disease: Secondary | ICD-10-CM | POA: Diagnosis not present

## 2022-05-16 DIAGNOSIS — M8580 Other specified disorders of bone density and structure, unspecified site: Secondary | ICD-10-CM | POA: Diagnosis not present

## 2022-05-16 DIAGNOSIS — I130 Hypertensive heart and chronic kidney disease with heart failure and stage 1 through stage 4 chronic kidney disease, or unspecified chronic kidney disease: Secondary | ICD-10-CM | POA: Diagnosis not present

## 2022-05-16 DIAGNOSIS — Z7901 Long term (current) use of anticoagulants: Secondary | ICD-10-CM | POA: Diagnosis not present

## 2022-05-20 DIAGNOSIS — Z794 Long term (current) use of insulin: Secondary | ICD-10-CM | POA: Diagnosis not present

## 2022-05-20 DIAGNOSIS — Z9181 History of falling: Secondary | ICD-10-CM | POA: Diagnosis not present

## 2022-05-20 DIAGNOSIS — I130 Hypertensive heart and chronic kidney disease with heart failure and stage 1 through stage 4 chronic kidney disease, or unspecified chronic kidney disease: Secondary | ICD-10-CM | POA: Diagnosis not present

## 2022-05-20 DIAGNOSIS — Z7901 Long term (current) use of anticoagulants: Secondary | ICD-10-CM | POA: Diagnosis not present

## 2022-05-20 DIAGNOSIS — K219 Gastro-esophageal reflux disease without esophagitis: Secondary | ICD-10-CM | POA: Diagnosis not present

## 2022-05-20 DIAGNOSIS — Z853 Personal history of malignant neoplasm of breast: Secondary | ICD-10-CM | POA: Diagnosis not present

## 2022-05-20 DIAGNOSIS — M19049 Primary osteoarthritis, unspecified hand: Secondary | ICD-10-CM | POA: Diagnosis not present

## 2022-05-20 DIAGNOSIS — Z86711 Personal history of pulmonary embolism: Secondary | ICD-10-CM | POA: Diagnosis not present

## 2022-05-20 DIAGNOSIS — I255 Ischemic cardiomyopathy: Secondary | ICD-10-CM | POA: Diagnosis not present

## 2022-05-20 DIAGNOSIS — Z7982 Long term (current) use of aspirin: Secondary | ICD-10-CM | POA: Diagnosis not present

## 2022-05-20 DIAGNOSIS — I5020 Unspecified systolic (congestive) heart failure: Secondary | ICD-10-CM | POA: Diagnosis not present

## 2022-05-20 DIAGNOSIS — I480 Paroxysmal atrial fibrillation: Secondary | ICD-10-CM | POA: Diagnosis not present

## 2022-05-20 DIAGNOSIS — E785 Hyperlipidemia, unspecified: Secondary | ICD-10-CM | POA: Diagnosis not present

## 2022-05-20 DIAGNOSIS — I25119 Atherosclerotic heart disease of native coronary artery with unspecified angina pectoris: Secondary | ICD-10-CM | POA: Diagnosis not present

## 2022-05-20 DIAGNOSIS — N1831 Chronic kidney disease, stage 3a: Secondary | ICD-10-CM | POA: Diagnosis not present

## 2022-05-20 DIAGNOSIS — M8580 Other specified disorders of bone density and structure, unspecified site: Secondary | ICD-10-CM | POA: Diagnosis not present

## 2022-05-20 DIAGNOSIS — I252 Old myocardial infarction: Secondary | ICD-10-CM | POA: Diagnosis not present

## 2022-05-20 DIAGNOSIS — F32A Depression, unspecified: Secondary | ICD-10-CM | POA: Diagnosis not present

## 2022-05-20 DIAGNOSIS — G4700 Insomnia, unspecified: Secondary | ICD-10-CM | POA: Diagnosis not present

## 2022-05-20 DIAGNOSIS — E1122 Type 2 diabetes mellitus with diabetic chronic kidney disease: Secondary | ICD-10-CM | POA: Diagnosis not present

## 2022-05-23 ENCOUNTER — Ambulatory Visit: Payer: Self-pay

## 2022-05-23 NOTE — Patient Outreach (Signed)
  Care Coordination   Initial Visit Note   05/23/2022 Name: Dana Coleman MRN: [id number] DOB: 17-Jun-1941  Dana Coleman is a 81 y.o. year old female who sees Darci Current, DO for primary care. I spoke with  [name] by phone today  What matters to the patients health and wellness today?  "I am feeling good, I need to make an appointment to come in for my A1C".  Patient to call and set up appointment.   Goals Addressed   None     SDOH assessments and interventions completed:  Yes     Care Coordination Interventions Activated:  No  Care Coordination Interventions:  No, not indicated   Follow up plan: No further intervention required.   Encounter Outcome:  Pt. Visit Completed

## 2022-05-26 DIAGNOSIS — I509 Heart failure, unspecified: Secondary | ICD-10-CM | POA: Diagnosis not present

## 2022-05-26 DIAGNOSIS — Z853 Personal history of malignant neoplasm of breast: Secondary | ICD-10-CM | POA: Diagnosis not present

## 2022-05-26 DIAGNOSIS — M19049 Primary osteoarthritis, unspecified hand: Secondary | ICD-10-CM | POA: Diagnosis not present

## 2022-05-26 DIAGNOSIS — Z86711 Personal history of pulmonary embolism: Secondary | ICD-10-CM | POA: Diagnosis not present

## 2022-05-26 DIAGNOSIS — Z9181 History of falling: Secondary | ICD-10-CM | POA: Diagnosis not present

## 2022-05-26 DIAGNOSIS — Z794 Long term (current) use of insulin: Secondary | ICD-10-CM | POA: Diagnosis not present

## 2022-05-26 DIAGNOSIS — N1831 Chronic kidney disease, stage 3a: Secondary | ICD-10-CM | POA: Diagnosis not present

## 2022-05-26 DIAGNOSIS — I255 Ischemic cardiomyopathy: Secondary | ICD-10-CM | POA: Diagnosis not present

## 2022-05-26 DIAGNOSIS — I5020 Unspecified systolic (congestive) heart failure: Secondary | ICD-10-CM | POA: Diagnosis not present

## 2022-05-26 DIAGNOSIS — Z7982 Long term (current) use of aspirin: Secondary | ICD-10-CM | POA: Diagnosis not present

## 2022-05-26 DIAGNOSIS — G4700 Insomnia, unspecified: Secondary | ICD-10-CM | POA: Diagnosis not present

## 2022-05-26 DIAGNOSIS — M8580 Other specified disorders of bone density and structure, unspecified site: Secondary | ICD-10-CM | POA: Diagnosis not present

## 2022-05-26 DIAGNOSIS — E1122 Type 2 diabetes mellitus with diabetic chronic kidney disease: Secondary | ICD-10-CM | POA: Diagnosis not present

## 2022-05-26 DIAGNOSIS — I480 Paroxysmal atrial fibrillation: Secondary | ICD-10-CM | POA: Diagnosis not present

## 2022-05-26 DIAGNOSIS — I25119 Atherosclerotic heart disease of native coronary artery with unspecified angina pectoris: Secondary | ICD-10-CM | POA: Diagnosis not present

## 2022-05-26 DIAGNOSIS — Z7901 Long term (current) use of anticoagulants: Secondary | ICD-10-CM | POA: Diagnosis not present

## 2022-05-26 DIAGNOSIS — I130 Hypertensive heart and chronic kidney disease with heart failure and stage 1 through stage 4 chronic kidney disease, or unspecified chronic kidney disease: Secondary | ICD-10-CM | POA: Diagnosis not present

## 2022-05-26 DIAGNOSIS — I252 Old myocardial infarction: Secondary | ICD-10-CM | POA: Diagnosis not present

## 2022-05-26 DIAGNOSIS — K219 Gastro-esophageal reflux disease without esophagitis: Secondary | ICD-10-CM | POA: Diagnosis not present

## 2022-05-26 DIAGNOSIS — F32A Depression, unspecified: Secondary | ICD-10-CM | POA: Diagnosis not present

## 2022-05-26 DIAGNOSIS — E785 Hyperlipidemia, unspecified: Secondary | ICD-10-CM | POA: Diagnosis not present

## 2022-05-28 ENCOUNTER — Telehealth: Payer: Self-pay

## 2022-05-28 NOTE — Telephone Encounter (Signed)
Amy from [place] calling for nursing verbal orders as follows:  1 time(s) weekly for 4 week(s)  Verbal orders given per [facility] protocol  Talbot Grumbling, RN

## 2022-05-30 LAB — HM DIABETES EYE EXAM

## 2022-06-02 ENCOUNTER — Ambulatory Visit (HOSPITAL_COMMUNITY)
Admission: RE | Admit: 2022-06-02 | Discharge: 2022-06-02 | Disposition: A | Discharge status: Home / Self Care | Payer: Medicare Other | Source: Ambulatory Visit | Attending: Internal Medicine | Admitting: Internal Medicine

## 2022-06-02 ENCOUNTER — Ambulatory Visit (HOSPITAL_BASED_OUTPATIENT_CLINIC_OR_DEPARTMENT_OTHER)
Admission: RE | Admit: 2022-06-02 | Discharge: 2022-06-02 | Disposition: A | Discharge status: Home / Self Care | Payer: Medicare Other | Source: Ambulatory Visit | Attending: Internal Medicine | Admitting: Internal Medicine

## 2022-06-02 ENCOUNTER — Encounter (HOSPITAL_COMMUNITY): Payer: Self-pay | Admitting: Internal Medicine

## 2022-06-02 VITALS — BP 124/70 | HR 64 | Wt 117.6 lb

## 2022-06-02 DIAGNOSIS — Z923 Personal history of irradiation: Secondary | ICD-10-CM | POA: Diagnosis not present

## 2022-06-02 DIAGNOSIS — I5020 Unspecified systolic (congestive) heart failure: Secondary | ICD-10-CM | POA: Diagnosis not present

## 2022-06-02 DIAGNOSIS — I5022 Chronic systolic (congestive) heart failure: Secondary | ICD-10-CM | POA: Insufficient documentation

## 2022-06-02 DIAGNOSIS — Z79899 Other long term (current) drug therapy: Secondary | ICD-10-CM | POA: Insufficient documentation

## 2022-06-02 DIAGNOSIS — Z955 Presence of coronary angioplasty implant and graft: Secondary | ICD-10-CM | POA: Insufficient documentation

## 2022-06-02 DIAGNOSIS — Z794 Long term (current) use of insulin: Secondary | ICD-10-CM | POA: Diagnosis not present

## 2022-06-02 DIAGNOSIS — I10 Essential (primary) hypertension: Secondary | ICD-10-CM | POA: Diagnosis not present

## 2022-06-02 DIAGNOSIS — I110 Hypertensive heart disease with heart failure: Secondary | ICD-10-CM | POA: Diagnosis not present

## 2022-06-02 DIAGNOSIS — I480 Paroxysmal atrial fibrillation: Secondary | ICD-10-CM | POA: Insufficient documentation

## 2022-06-02 DIAGNOSIS — E119 Type 2 diabetes mellitus without complications: Secondary | ICD-10-CM | POA: Diagnosis not present

## 2022-06-02 DIAGNOSIS — Z7982 Long term (current) use of aspirin: Secondary | ICD-10-CM | POA: Insufficient documentation

## 2022-06-02 DIAGNOSIS — I5082 Biventricular heart failure: Secondary | ICD-10-CM | POA: Diagnosis not present

## 2022-06-02 DIAGNOSIS — Z853 Personal history of malignant neoplasm of breast: Secondary | ICD-10-CM | POA: Diagnosis not present

## 2022-06-02 DIAGNOSIS — Z86711 Personal history of pulmonary embolism: Secondary | ICD-10-CM | POA: Insufficient documentation

## 2022-06-02 DIAGNOSIS — Z7901 Long term (current) use of anticoagulants: Secondary | ICD-10-CM | POA: Insufficient documentation

## 2022-06-02 DIAGNOSIS — Z9011 Acquired absence of right breast and nipple: Secondary | ICD-10-CM | POA: Insufficient documentation

## 2022-06-02 LAB — ECHOCARDIOGRAM COMPLETE
Area-P 1/2: 2.25 cm2
Calc EF: 36.8 %
S' Lateral: 4.1 cm
Single Plane A2C EF: 37.4 %
Single Plane A4C EF: 34.9 %

## 2022-06-02 LAB — BASIC METABOLIC PANEL
Anion gap: 8 (ref 5–15)
BUN: 16 mg/dL (ref 8–23)
CO2: 28 mmol/L (ref 22–32)
Calcium: 9.5 mg/dL (ref 8.9–10.3)
Chloride: 103 mmol/L (ref 98–111)
Creatinine, Ser: 1.27 mg/dL — ABNORMAL HIGH (ref 0.44–1.00)
GFR, Estimated: 43 mL/min — ABNORMAL LOW (ref >60)
Glucose, Bld: 114 mg/dL — ABNORMAL HIGH (ref 70–99)
Potassium: 4.6 mmol/L (ref 3.5–5.1)
Sodium: 139 mmol/L (ref 135–145)

## 2022-06-02 LAB — BRAIN NATRIURETIC PEPTIDE: B Natriuretic Peptide: 183.5 pg/mL — ABNORMAL HIGH (ref 0.0–100.0)

## 2022-06-02 NOTE — Progress Notes (Signed)
ADVANCED HF CLINIC NOTE  Primary Care: Darci Current, DO Primary Cardiologist: Dr. Terrence Dupont HF Cardiologist: Dr. Haroldine Laws  HPI: Dana Coleman is a 81 y.o. female w/ h/o CAD, s/p remote inferior wall MI in 2000 treated w/ PCI to RCA, hypertension, type 2DM, HLD and h/o right sided breast cancer treated w/ radiation and mastectomy, no chemotherapy.    Followed by Dr. Terrence Dupont Echo 2019 EF 55-60%, G1D, RV normal  NST 2019 c/w prior inferior myocardial infarction. Small mild distal anterior/apical infarct with mild peri-infarct ischemia>> Low risk study    She was extremely sick 04/2021 w/ nearly 6 wk hospitalization. She had laparoscopic cholecystectomy on 05/02/21, discharged home, developed abdominal pain associated with sudden onset of altered mental status noted to be hypotensive, bradycardic and hypoxic by EMS requiring intubation and subsequently was noted to have a right empyema and perforation of the esophagus requiring emergent thoracotomy w/ repair of esophageal perforation with intercostal pedicled muscle flap, drainage of empyema and decortication of right lung on 7/24.    7/25, she developed EKG changes w/ minimal inferior ST elevation and troponin leak. Echo showed moderately reduced LVEF, 40%, and severely reduced RV systolic function. However given recent surgery, she was not a candidate for intervention. She later developed increased O2 requirements, subsequently had CT angiogram which showed bilateral pulmonary embolism.  Repeat limited echo showed EF 25-30%, RV normal. No evidence of significant strain>>>IV heparin>>Eliquis. Hospitalization also notable for atrial flutter. Dr. Terrence Dupont followed during hospitalization.    Of note EKG 1/23 showed afib w/ CVR. F/u EKGs 3/23 showed NSR.   Admit 3/23 for a/c CHF. Remained in NSR during admission. Echo EF 25-30%, RV moderately reduced right ventricular systolic pressure 27.7 mmHg, mild MR. Diuresed 9 lb down to a dry wt of 119 lb.  Discharged home on lasix, Entresto and Toprol XL. Referred to [facility] clinic. Seen in [name] 4/23, remained volume overloaded, Entresto increased to goal dose and Lasix started.   Follow up 5/23, NYHA II-early III, volume stable.  Admitted 6/23 with CP, cardiology consulted. CHF compensated and underwent nuclear stress test which showed LVEF 25%, large fixed defect involving apical and inferior myocardium consistent with old infarction. Hospitalization c/b dizziness and low BP and hydralazine was stopped, Entresto reduced to 24/26. She was discharged home with [facility] PT/OT/RN, weight 118 lbs.  Echo 06/02/22 EF 30-35% previous inferior, inferolateral MI Personally reviewed.  Today she returns for HF follow up. Overall feeling fine. She has SOB walking further distances on flat ground w/ her cane, but does well walking to mailbox and with ADLs. Denies abnormal bleeding, palpitations, CP, dizziness, edema, or PND/Orthopnea. Appetite ok. No fever or chills. Weight at home 115 pounds. Taking all medications. She lives alone but her siblings live down the street and help her out. She has [place] [employer], sports.   Cardiac Testing    - Echo (3/19): EF 55-60%, G1D, RV normal    - NST 12/2017  There was no ST segment deviation noted during stress. T wave inversion was noted during stress in the III leads, beginning at 1 minutes of stress. T wave inversion persisted. Findings consistent with prior inferior myocardial infarction. Small mild distal anterior/apical infarct with mild peri-infarct ischemia. This is a low risk study. Mild area of current ischemia. The left ventricular ejection fraction is normal (55-65%).     - Echo (7/22): EF 45%, RV severely reduced   - Ltd Echo (8/22): EF 25-30%, RV normal   - Echo (3/23): EF 25-30%, RV  moderately reduced right ventricular systolic pressure is 96.2 mmHg, mild MR   - Lexiscan myoview (6/23): LVEF 25%, large fixed defect apical and inferior myocardium, consistent with old  infarction.   - Echo (8/23): EF 30-35%, previous inferior, inferolateral MI.   Past Medical History:  Diagnosis Date   Allergy    occ uses OTC allergy meds    Arthritis    fingers   Breast cancer ([place]) 04/2008   Right breast   Cataracts, bilateral    removed bilat    Cervical cancer ([place])    When the patient was in her 18s   Depression    Diabetes mellitus without complication ([place])    Empyema ([place])    Esophageal perforation 05/05/2021   Failure to thrive in adult 07/22/2021   Wt Readings from Last 3 Encounters: 06/26/21 65.8 kg 06/17/21 64.8 kg 04/26/21 58.1 kg     Gastrostomy tube in place [facility]) 08/21/2021   Heart attack ([place]) 2004   History of cervical cancer    S/p radiation and surgical removal in 30's.  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 Truddie Coco   PEG (percutaneous endoscopic gastrostomy) status ([place]) 07/23/2021   Personal history of radiation therapy 2009   Pressure injury of skin 05/05/2021   Protein-calorie malnutrition, severe 05/15/2021   Pyelonephritis 11/06/2021   Respiratory failure ([place]) 05/04/2021   S/P thoracotomy    UTI (urinary tract infection) 11/05/2021    Current Outpatient Medications  Medication Sig Dispense Refill   acetaminophen (TYLENOL) 500 MG tablet Take 500 mg by mouth every 6 (six) hours as needed for moderate pain.     apixaban (ELIQUIS) 2.5 MG TABS tablet Take 1 tablet (2.5 mg total) by mouth 2 (two) times daily. 60 tablet 4   aspirin EC 81 MG tablet Take 81 mg by mouth in the morning. Swallow whole.     Blood Glucose Monitoring Suppl MISC daily.     furosemide (LASIX) 80 MG tablet Take 0.5 tablets (40 mg total) by mouth daily as needed. 30 tablet 3   glucose blood (ONE TOUCH ULTRA  TEST) test strip CHECK BLOOD SUGAR TWICE DAILY 100 each 1   insulin glargine (LANTUS SOLOSTAR) 100 UNIT/ML Solostar Pen Inject 6 Units into the skin in the morning.     Insulin Pen Needle (B-D UF III MINI PEN NEEDLES) 31G X 5 MM MISC INJECT INSULIN VIA PEN 6 TIMES DAILY 200 each 3   isosorbide mononitrate (IMDUR) 30 MG 24 hr tablet Take 30 mg by mouth daily.     Melatonin 3 MG TBDP Take 6 mg by mouth at bedtime as needed (sleep).     metoprolol succinate (TOPROL-XL) 25 MG 24 hr tablet Take 0.5 tablets (12.5 mg total) by mouth daily. 30 tablet 6   nitroGLYCERIN (NITROSTAT) 0.4 MG SL tablet Place 0.4 mg under the tongue every 5 (five) minutes as needed for chest pain.     omeprazole (PRILOSEC) 20 MG capsule Take 40 mg by mouth every morning.     ondansetron (ZOFRAN) 4 MG tablet Take 1 tablet (4 mg total) by mouth daily as needed for nausea or vomiting. 30 tablet 1   OneTouch Delica Lancets 95M MISC  1 Container by Does not apply route as needed. 100 each PRN   rosuvastatin (CRESTOR) 20 MG tablet TAKE 1 TABLET BY MOUTH EVERYDAY AT BEDTIME 90 tablet 1   sacubitril-valsartan (ENTRESTO) 24-26 MG Take 1 tablet by mouth 2 (two) times daily. 60 tablet 2   spironolactone (ALDACTONE) 25 MG tablet Take 0.5 tablets (12.5 mg total) by mouth daily. 90 tablet 1   traZODone (DESYREL) 50 MG tablet Take 50 mg by mouth at bedtime as needed for sleep.     No current facility-administered medications for this encounter.   No Known Allergies  Social History   Socioeconomic History   Marital status: Divorced    Spouse name: Not on file   Number of children: 5   Years of education: 10   Highest education level: 10th grade  Occupational History   Occupation: Retired- IT trainer  Tobacco Use   Smoking status: Never   Smokeless tobacco: Never   Tobacco comments:    No plans to start  Vaping Use   Vaping Use: Never used  Substance and Sexual Activity   Alcohol use: No   Drug use: No   Sexual  activity: Not Currently    Birth control/protection: Condom  Other Topics Concern   Not on file  Social History Narrative   Patient lives alone in [place].   Patient is close with daughters Solmon Ice and [place]. One Son in [place]. 2 children have passed.    Patient depends on her sister and brother for support and any assistance needed.    [name] (sister) [phone].   Religious / Personal Beliefs: Baptist   Patient does not drive.   Diet: Pt has a varied diet of protein, starch, and vegetables and fruits.   Seatbelts: Pt reports wearing seatbelt when in vehicles.    Hobbies: shopping, visiting family, church   Education / Work:  10th grade/ [employer] Strain: [place]  (10/17/2021)   Overall Financial Resource Strain (CARDIA)    Difficulty of Paying Living Expenses: Not hard at all  Food Insecurity: No Food Insecurity (10/17/2021)   Hunger Vital Sign    Worried About Running Out of Food in the Last Year: Never true    [place] in the Last Year: Never true  Transportation Needs: No Transportation Needs (10/17/2021)   PRAPARE - Hydrologist (Medical): No    Lack of Transportation (Non-Medical): No  Physical Activity: Insufficiently Active (10/17/2021)   Exercise Vital Sign    Days of Exercise per Week: 1 day    Minutes of Exercise per Session: 40 min  Stress: No Stress Concern Present (10/17/2021)   [place]    Feeling of Stress : Not at all  Social Connections: Moderately Isolated (10/17/2021)   Social Connection and Isolation Panel [NHANES]    Frequency of Communication with Friends and Family: More than three times a week    Frequency of Social Gatherings with Friends and Family: More than three times a week    Attends Religious  Services: More than 4 times per  year    Active Member of Clubs or Organizations: No    Attends Archivist Meetings: Never    Marital Status: Divorced  Human resources officer Violence: Not At Risk (10/17/2021)   Humiliation, Afraid, Rape, and Kick questionnaire    Fear of Current or Ex-Partner: No    Emotionally Abused: No    Physically Abused: No    Sexually Abused: No   Family History  Problem Relation Age of Onset   Heart disease Mother        age 51   Heart disease Father    Cancer Father        unknown   Heart disease Brother    Hypertension Brother    Cirrhosis Brother    Diabetes Daughter    Stroke Daughter    Kidney disease Daughter    Hypertension Son    Colon cancer Neg Hx    Colon polyps Neg Hx    Esophageal cancer Neg Hx    Stomach cancer Neg Hx    Rectal cancer Neg Hx    BP 124/70   Pulse 64   Wt 53.3 kg (117 lb 9.6 oz)   SpO2 98%   BMI 21.51 kg/m   Wt Readings from Last 3 Encounters:  06/02/22 53.3 kg (117 lb 9.6 oz)  04/30/22 53.5 kg (118 lb)  04/03/22 53 kg (116 lb 12.8 oz)   PHYSICAL EXAM: General:  NAD. No resp difficulty HEENT: Normal Neck: Supple. No JVD. Carotids 2+ bilat; no bruits. No lymphadenopathy or thryomegaly appreciated. Cor: PMI nondisplaced. Regular rate & rhythm. No rubs, gallops or murmurs. Lungs: Clear Abdomen: Soft, nontender, nondistended. No hepatosplenomegaly. No bruits or masses. Good bowel sounds. Extremities: No cyanosis, clubbing, rash, edema Neuro: Alert & oriented x 3, cranial nerves grossly intact. Moves all 4 extremities w/o difficulty. Affect pleasant.  ASSESSMENT & PLAN: Chronic Biventricular Heart Failure - Etiology uncertain, possible ischemic CM given known h/o CAD, though no recent CP. Remote inferior MI in 2000 w/ RCA PCI. Cath report unavailable, uncertain if any additional disease  - Echo (2019): showed normal LVEF, 55-60%, and normal RV - drop in EF 7/22, down to 25-30% w/ mod-severely reduced RV, in the setting of severe illness from  septic shock, hypoxic respiratory failure, in setting of rt lung empyema and esophageal perforation requiring emergency surgery, c/b perioperative PE and NSTEMI (medically managed in setting of severe illness) - Echo (3/23): EF 25-30%, RV moderately reduced. - Echo 06/02/22 EF 30-35% previous inferior, inferolateral MI Personally reviewed.  - NYHA II-early III, functional class difficult due to generally physically inactive. Volume looks OK today on exam. - Continue spiro 12.5 mg daily. Will not increase today w/ borderline K. - Continue Lasix 40 mg daily. - Continue Entresto 24/26 mg bid. Recently reduced with low BP - Continue Toprol XL 12.5 mg daily. No titration w/ bradycardia. - Continue Imdur 30 mg daily. Off hydralazine with low BP. - Intolerant of SGLT2i due to UTIs (previously tried [place]). - No digoxin w/ bradycardia.  - BMET and BNP today. - Not ICD candidate due to advanced age.   2. CAD  - h/o CAD, s/p remote inferior wall MI in 2000 treated w/ PCI to RCA - NST 2019 c/w prior inferior myocardial infarction. Small mild distal anterior/apical infarct with mild peri-infarct ischemia>> Low risk study.  - Recent admission for CP. Lexiscan (6/23) showed EF 25%, large fixed defect involving apical and inferior myocardium consistent  with old infarction.  - Patient followed by Dr. Terrence Dupont, medical management for now. - Continue ASA, statin and ? blocker.   3. PAF - Continue Toprol XL.  - Continue Eliquis, no bleeding issues.   4. H/o PE - On Eliquis.    5. Hypertension  - Well-controlled. Meds recently cut back due to low BP. - GDMT as above.   6. Type 2DM  - Managed by PCP - On insulin. - Failed SGLT2i due to frequent UTIs    Follow up in 6 months.  Rafael Bihari, FNP  2:56 PM   Patient seen and examined with the above-signed Advanced Practice Provider and/or Housestaff. I personally reviewed laboratory data, imaging studies and relevant notes. I independently  examined the patient and formulated the important aspects of the plan. I have edited the note to reflect any of my changes or salient points. I have personally discussed the plan with the patient and/or family.  81 y/o female with h/o CAD, PAF, HTN and systolic HF.   Doing well. Continues to improve. NYHA II-early III. Volume status looks good. No CP.  Echo today EF 30-35% s/p previous inferior MI  General:  Well appearing. No resp difficulty HEENT: normal Neck: supple. no JVD. Carotids 2+ bilat; no bruits. No lymphadenopathy or thryomegaly appreciated. Cor: PMI nondisplaced. Regular rate & rhythm. No rubs, gallops or murmurs. Lungs: clear Abdomen: soft, nontender, nondistended. No hepatosplenomegaly. No bruits or masses. Good bowel sounds. Extremities: no cyanosis, clubbing, rash, edema Neuro: alert & orientedx3, cranial nerves grossly intact. moves all 4 extremities w/o difficulty. Affect pleasant  Doing very well. NYHA II-early III. Volume status ok. Echo reviewed personally. EF 30-35% with previous inferior MI.   On good GDMT. Will continue. Will defer ICD given age and overall situation.   Glori Bickers, MD  4:38 PM

## 2022-06-02 NOTE — Patient Instructions (Signed)
Labs done today, we will call you for abnormal results  Your physician recommends that you schedule a follow-up appointment in: 6 months (Feb 2024), **PLEASE CALL OUR OFFICE IN DECEMBER TO SCHEDULE  If you have any questions or concerns before your next appointment please send [place] a message through [place] or call our office at [phone].    TO LEAVE A MESSAGE FOR THE NURSE SELECT OPTION 2, PLEASE LEAVE A MESSAGE INCLUDING: YOUR NAME DATE OF BIRTH CALL BACK NUMBER REASON FOR CALL**this is important as we prioritize the call backs  YOU WILL RECEIVE A CALL BACK THE SAME DAY AS LONG AS YOU CALL BEFORE 4:00 PM   At the [facility], you and your health needs are our priority. As part of our continuing mission to provide you with exceptional heart care, we have created designated Provider Care Teams. These Care Teams include your primary Cardiologist (physician) and Advanced Practice Providers (APPs- Physician Assistants and Nurse Practitioners) who all work together to provide you with the care you need, when you need it.   You may see any of the following providers on your designated Care Team at your next follow up: Dr Glori Bickers Dr Haynes Kerns, NP Lyda Jester, [place] [facility] [place], [place] Audry Riles, PharmD   Please be sure to bring in all your medications bottles to every appointment.

## 2022-06-03 DIAGNOSIS — K219 Gastro-esophageal reflux disease without esophagitis: Secondary | ICD-10-CM | POA: Diagnosis not present

## 2022-06-03 DIAGNOSIS — N1831 Chronic kidney disease, stage 3a: Secondary | ICD-10-CM | POA: Diagnosis not present

## 2022-06-03 DIAGNOSIS — M8580 Other specified disorders of bone density and structure, unspecified site: Secondary | ICD-10-CM | POA: Diagnosis not present

## 2022-06-03 DIAGNOSIS — I130 Hypertensive heart and chronic kidney disease with heart failure and stage 1 through stage 4 chronic kidney disease, or unspecified chronic kidney disease: Secondary | ICD-10-CM | POA: Diagnosis not present

## 2022-06-03 DIAGNOSIS — Z9181 History of falling: Secondary | ICD-10-CM | POA: Diagnosis not present

## 2022-06-03 DIAGNOSIS — I480 Paroxysmal atrial fibrillation: Secondary | ICD-10-CM | POA: Diagnosis not present

## 2022-06-03 DIAGNOSIS — M19049 Primary osteoarthritis, unspecified hand: Secondary | ICD-10-CM | POA: Diagnosis not present

## 2022-06-03 DIAGNOSIS — F32A Depression, unspecified: Secondary | ICD-10-CM | POA: Diagnosis not present

## 2022-06-03 DIAGNOSIS — Z86711 Personal history of pulmonary embolism: Secondary | ICD-10-CM | POA: Diagnosis not present

## 2022-06-03 DIAGNOSIS — I255 Ischemic cardiomyopathy: Secondary | ICD-10-CM | POA: Diagnosis not present

## 2022-06-03 DIAGNOSIS — Z7982 Long term (current) use of aspirin: Secondary | ICD-10-CM | POA: Diagnosis not present

## 2022-06-03 DIAGNOSIS — E1122 Type 2 diabetes mellitus with diabetic chronic kidney disease: Secondary | ICD-10-CM | POA: Diagnosis not present

## 2022-06-03 DIAGNOSIS — Z7901 Long term (current) use of anticoagulants: Secondary | ICD-10-CM | POA: Diagnosis not present

## 2022-06-03 DIAGNOSIS — E785 Hyperlipidemia, unspecified: Secondary | ICD-10-CM | POA: Diagnosis not present

## 2022-06-03 DIAGNOSIS — Z794 Long term (current) use of insulin: Secondary | ICD-10-CM | POA: Diagnosis not present

## 2022-06-03 DIAGNOSIS — I252 Old myocardial infarction: Secondary | ICD-10-CM | POA: Diagnosis not present

## 2022-06-03 DIAGNOSIS — Z853 Personal history of malignant neoplasm of breast: Secondary | ICD-10-CM | POA: Diagnosis not present

## 2022-06-03 DIAGNOSIS — I5020 Unspecified systolic (congestive) heart failure: Secondary | ICD-10-CM | POA: Diagnosis not present

## 2022-06-03 DIAGNOSIS — G4700 Insomnia, unspecified: Secondary | ICD-10-CM | POA: Diagnosis not present

## 2022-06-03 DIAGNOSIS — I25119 Atherosclerotic heart disease of native coronary artery with unspecified angina pectoris: Secondary | ICD-10-CM | POA: Diagnosis not present

## 2022-06-10 DIAGNOSIS — M8580 Other specified disorders of bone density and structure, unspecified site: Secondary | ICD-10-CM | POA: Diagnosis not present

## 2022-06-10 DIAGNOSIS — I130 Hypertensive heart and chronic kidney disease with heart failure and stage 1 through stage 4 chronic kidney disease, or unspecified chronic kidney disease: Secondary | ICD-10-CM | POA: Diagnosis not present

## 2022-06-10 DIAGNOSIS — I480 Paroxysmal atrial fibrillation: Secondary | ICD-10-CM | POA: Diagnosis not present

## 2022-06-10 DIAGNOSIS — M19049 Primary osteoarthritis, unspecified hand: Secondary | ICD-10-CM | POA: Diagnosis not present

## 2022-06-10 DIAGNOSIS — Z9181 History of falling: Secondary | ICD-10-CM | POA: Diagnosis not present

## 2022-06-10 DIAGNOSIS — N1831 Chronic kidney disease, stage 3a: Secondary | ICD-10-CM | POA: Diagnosis not present

## 2022-06-10 DIAGNOSIS — Z7901 Long term (current) use of anticoagulants: Secondary | ICD-10-CM | POA: Diagnosis not present

## 2022-06-10 DIAGNOSIS — G4700 Insomnia, unspecified: Secondary | ICD-10-CM | POA: Diagnosis not present

## 2022-06-10 DIAGNOSIS — F32A Depression, unspecified: Secondary | ICD-10-CM | POA: Diagnosis not present

## 2022-06-10 DIAGNOSIS — I5020 Unspecified systolic (congestive) heart failure: Secondary | ICD-10-CM | POA: Diagnosis not present

## 2022-06-10 DIAGNOSIS — Z7982 Long term (current) use of aspirin: Secondary | ICD-10-CM | POA: Diagnosis not present

## 2022-06-10 DIAGNOSIS — E785 Hyperlipidemia, unspecified: Secondary | ICD-10-CM | POA: Diagnosis not present

## 2022-06-10 DIAGNOSIS — I255 Ischemic cardiomyopathy: Secondary | ICD-10-CM | POA: Diagnosis not present

## 2022-06-10 DIAGNOSIS — Z794 Long term (current) use of insulin: Secondary | ICD-10-CM | POA: Diagnosis not present

## 2022-06-10 DIAGNOSIS — K219 Gastro-esophageal reflux disease without esophagitis: Secondary | ICD-10-CM | POA: Diagnosis not present

## 2022-06-10 DIAGNOSIS — I252 Old myocardial infarction: Secondary | ICD-10-CM | POA: Diagnosis not present

## 2022-06-10 DIAGNOSIS — Z86711 Personal history of pulmonary embolism: Secondary | ICD-10-CM | POA: Diagnosis not present

## 2022-06-10 DIAGNOSIS — I25119 Atherosclerotic heart disease of native coronary artery with unspecified angina pectoris: Secondary | ICD-10-CM | POA: Diagnosis not present

## 2022-06-10 DIAGNOSIS — E1122 Type 2 diabetes mellitus with diabetic chronic kidney disease: Secondary | ICD-10-CM | POA: Diagnosis not present

## 2022-06-10 DIAGNOSIS — Z853 Personal history of malignant neoplasm of breast: Secondary | ICD-10-CM | POA: Diagnosis not present

## 2022-06-12 DIAGNOSIS — I2510 Atherosclerotic heart disease of native coronary artery without angina pectoris: Secondary | ICD-10-CM | POA: Diagnosis not present

## 2022-06-12 DIAGNOSIS — I5022 Chronic systolic (congestive) heart failure: Secondary | ICD-10-CM | POA: Diagnosis not present

## 2022-06-13 ENCOUNTER — Other Ambulatory Visit (HOSPITAL_COMMUNITY): Payer: Medicare Other

## 2022-06-13 ENCOUNTER — Encounter (HOSPITAL_COMMUNITY): Payer: Medicare Other | Admitting: Cardiology

## 2022-06-17 DIAGNOSIS — N1831 Chronic kidney disease, stage 3a: Secondary | ICD-10-CM | POA: Diagnosis not present

## 2022-06-17 DIAGNOSIS — I130 Hypertensive heart and chronic kidney disease with heart failure and stage 1 through stage 4 chronic kidney disease, or unspecified chronic kidney disease: Secondary | ICD-10-CM | POA: Diagnosis not present

## 2022-06-17 DIAGNOSIS — E1122 Type 2 diabetes mellitus with diabetic chronic kidney disease: Secondary | ICD-10-CM | POA: Diagnosis not present

## 2022-06-17 DIAGNOSIS — I480 Paroxysmal atrial fibrillation: Secondary | ICD-10-CM | POA: Diagnosis not present

## 2022-06-17 DIAGNOSIS — G4700 Insomnia, unspecified: Secondary | ICD-10-CM | POA: Diagnosis not present

## 2022-06-17 DIAGNOSIS — I5020 Unspecified systolic (congestive) heart failure: Secondary | ICD-10-CM | POA: Diagnosis not present

## 2022-06-17 DIAGNOSIS — Z7982 Long term (current) use of aspirin: Secondary | ICD-10-CM | POA: Diagnosis not present

## 2022-06-17 DIAGNOSIS — K219 Gastro-esophageal reflux disease without esophagitis: Secondary | ICD-10-CM | POA: Diagnosis not present

## 2022-06-17 DIAGNOSIS — Z794 Long term (current) use of insulin: Secondary | ICD-10-CM | POA: Diagnosis not present

## 2022-06-17 DIAGNOSIS — I25119 Atherosclerotic heart disease of native coronary artery with unspecified angina pectoris: Secondary | ICD-10-CM | POA: Diagnosis not present

## 2022-06-17 DIAGNOSIS — I252 Old myocardial infarction: Secondary | ICD-10-CM | POA: Diagnosis not present

## 2022-06-17 DIAGNOSIS — Z7901 Long term (current) use of anticoagulants: Secondary | ICD-10-CM | POA: Diagnosis not present

## 2022-06-17 DIAGNOSIS — Z86711 Personal history of pulmonary embolism: Secondary | ICD-10-CM | POA: Diagnosis not present

## 2022-06-17 DIAGNOSIS — I255 Ischemic cardiomyopathy: Secondary | ICD-10-CM | POA: Diagnosis not present

## 2022-06-17 DIAGNOSIS — E785 Hyperlipidemia, unspecified: Secondary | ICD-10-CM | POA: Diagnosis not present

## 2022-06-17 DIAGNOSIS — F32A Depression, unspecified: Secondary | ICD-10-CM | POA: Diagnosis not present

## 2022-06-17 DIAGNOSIS — Z853 Personal history of malignant neoplasm of breast: Secondary | ICD-10-CM | POA: Diagnosis not present

## 2022-06-17 DIAGNOSIS — Z9181 History of falling: Secondary | ICD-10-CM | POA: Diagnosis not present

## 2022-06-17 DIAGNOSIS — M8580 Other specified disorders of bone density and structure, unspecified site: Secondary | ICD-10-CM | POA: Diagnosis not present

## 2022-06-17 DIAGNOSIS — M19049 Primary osteoarthritis, unspecified hand: Secondary | ICD-10-CM | POA: Diagnosis not present

## 2022-06-17 IMAGING — US US ABDOMEN LIMITED RUQ/ASCITES
1 series · 14 of 25 positions shown · non-contrast
Comparison: CT 07/28/2008

CLINICAL DATA: Abdomen pain for 1 week

EXAM:
ULTRASOUND ABDOMEN LIMITED RIGHT UPPER QUADRANT

[Series 1: us abdomen limited ruq (liver/gb) · 14 of 67 slices shown]
[im 1/67]
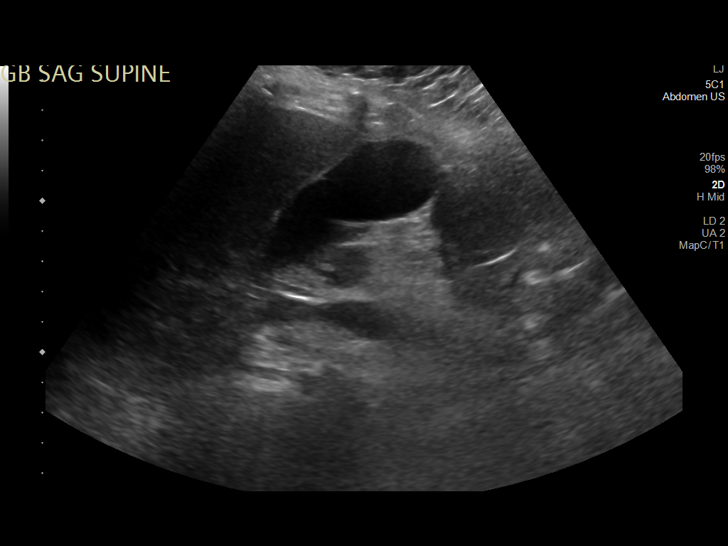
[im 6/67]
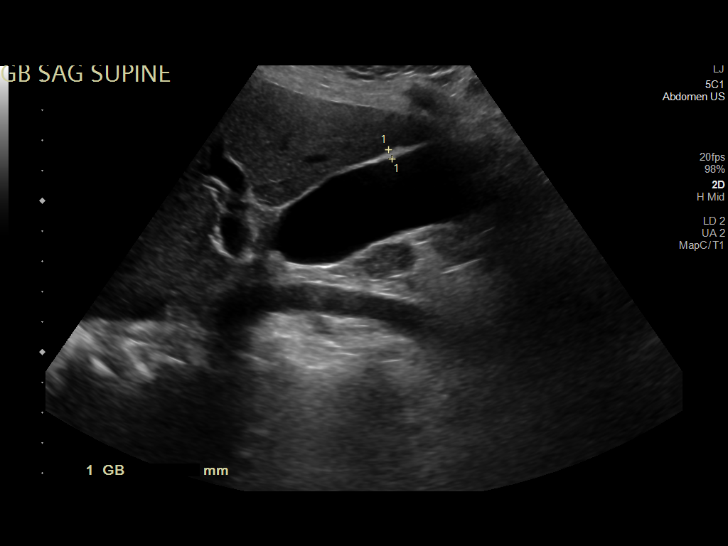
[im 12/67]
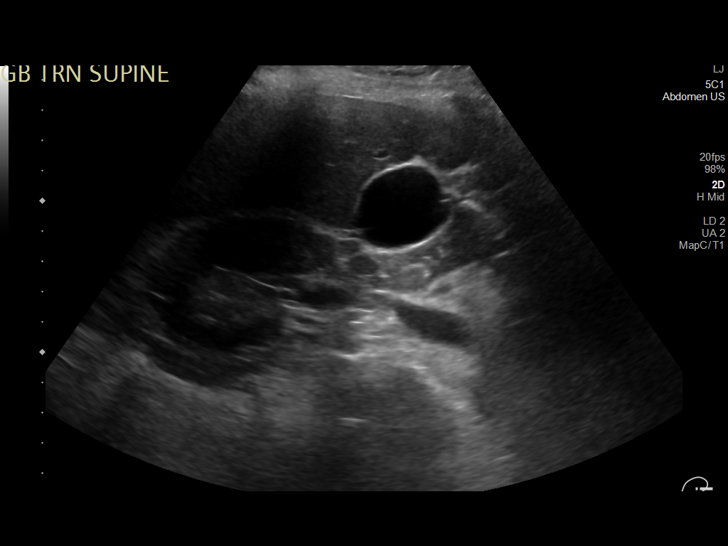
[im 17/67]
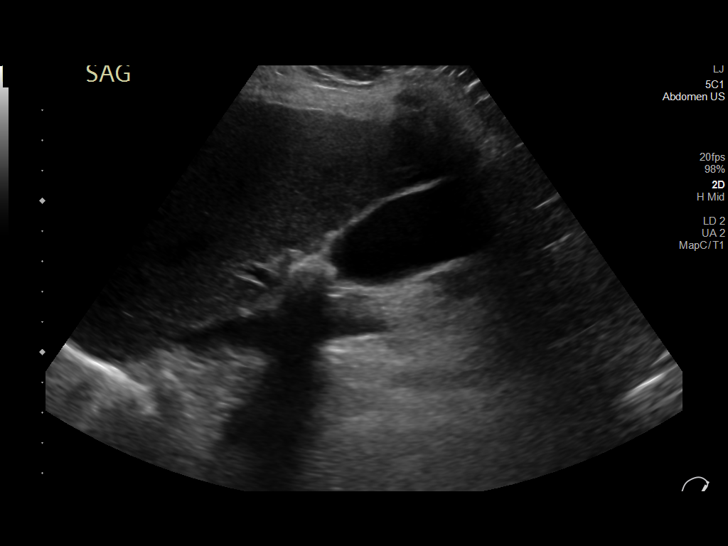
[im 23/67]
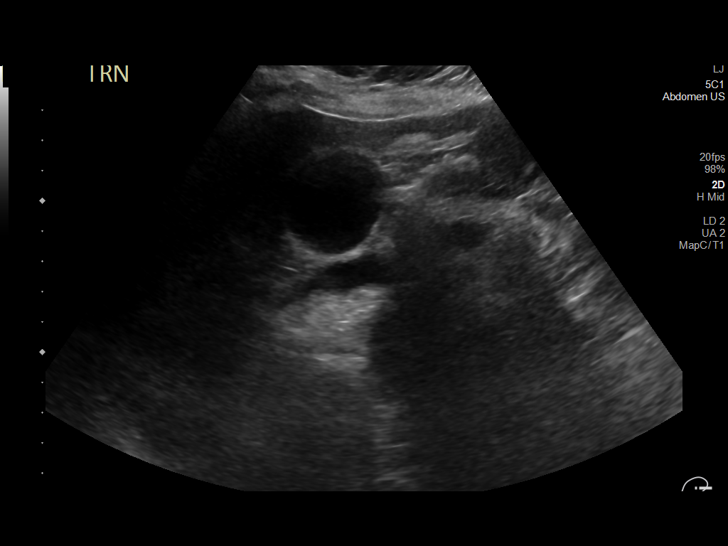
[im 25/67]
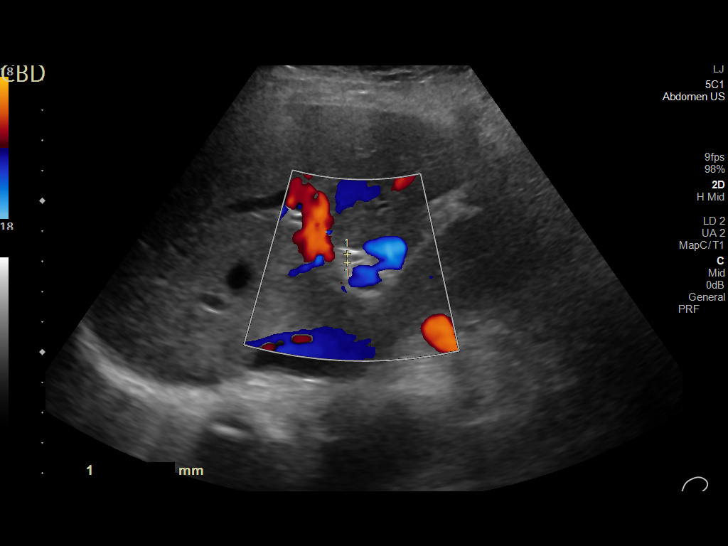
[im 31/67]
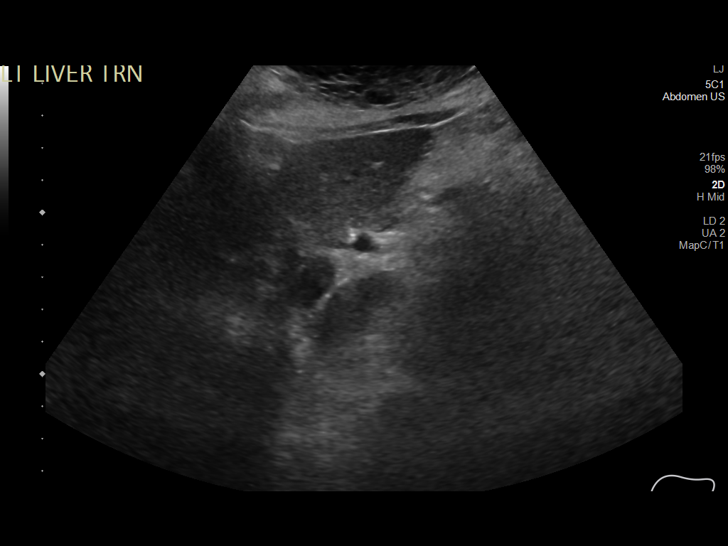
[im 36/67]
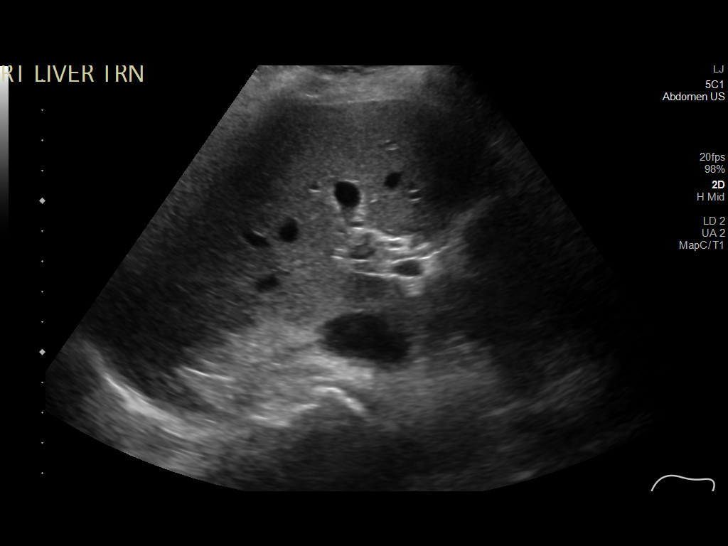
[im 42/67]
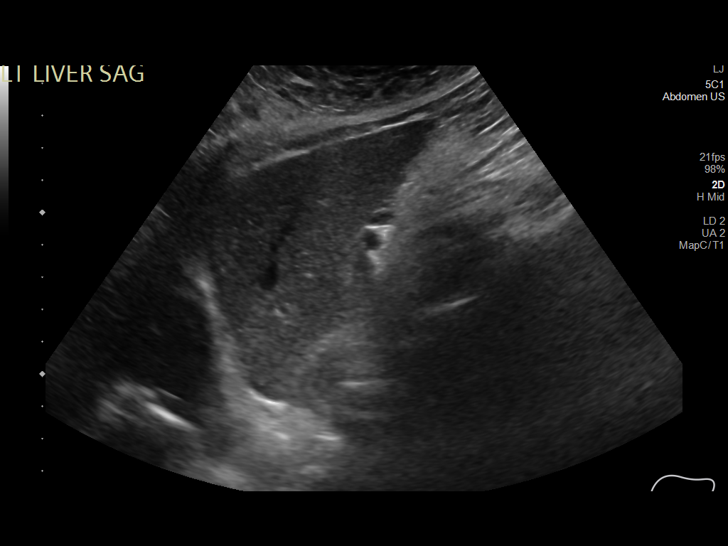
[im 45/67]
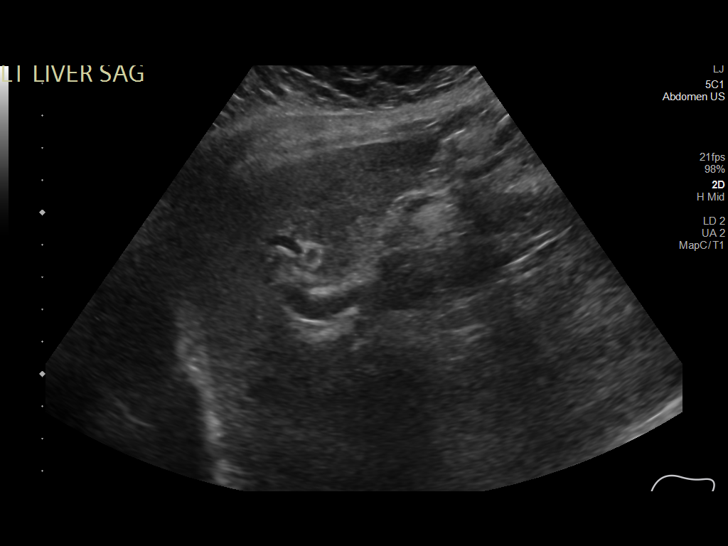
[im 50/67]
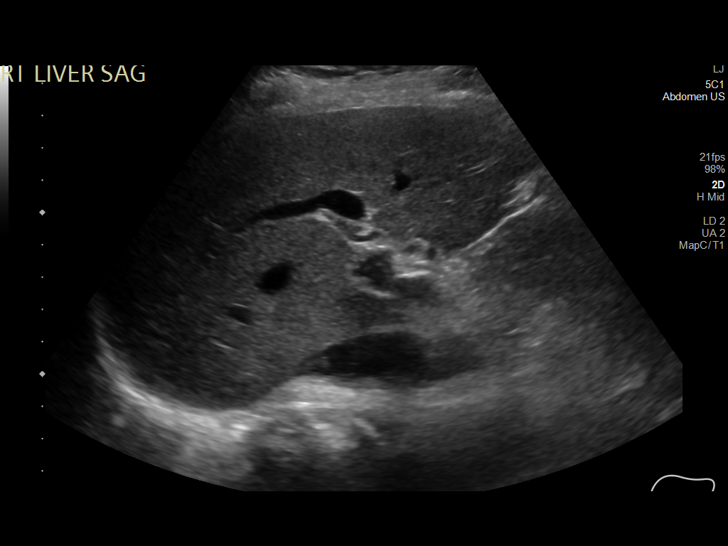
[im 56/67]
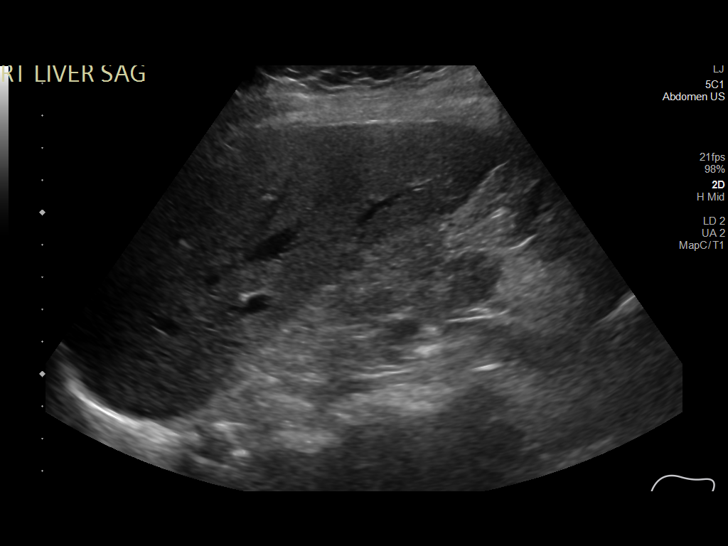
[im 61/67]
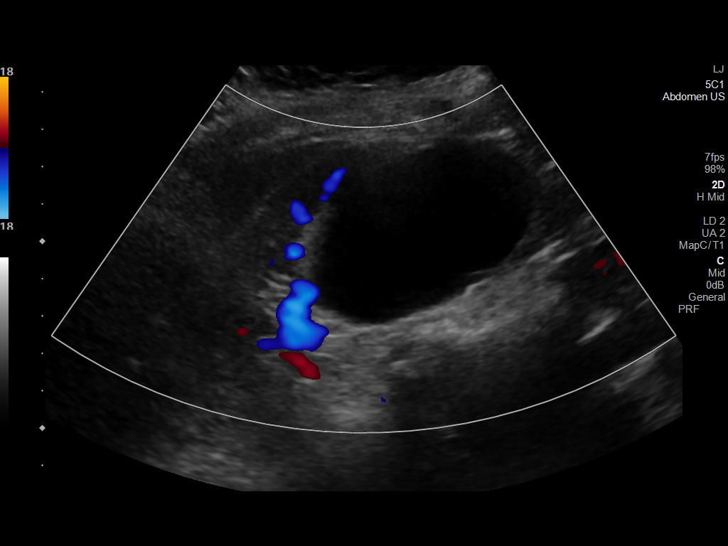
[im 67/67]
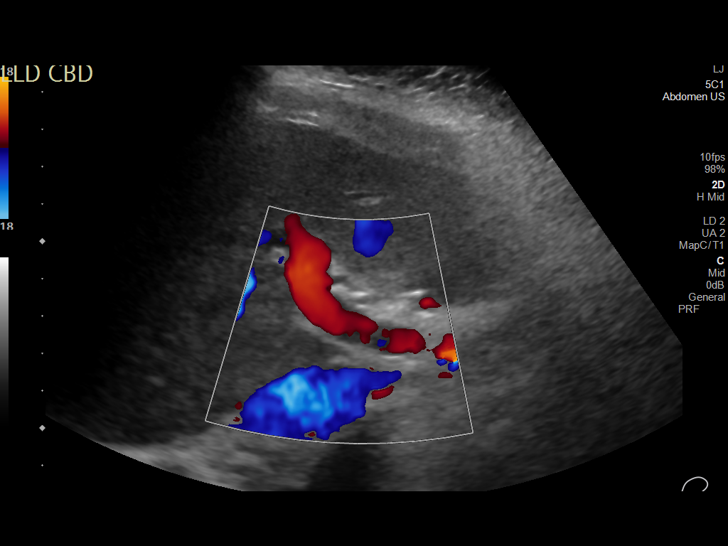

[14 of 25 positions shown; findings below may reference images not displayed]

FINDINGS: Gallbladder:

1.6 cm shadowing stone within the gallbladder. Negative sonographic
Murphy. Upper normal gallbladder wall thickness. No pericholecystic
fluid.

Common bile duct:

Diameter: 2.8 mm

Liver:

No focal lesion identified. Within normal limits in parenchymal
echogenicity. Portal vein is patent on color Doppler imaging with
normal direction of blood flow towards the liver.

Other: Large renal cyst in the right upper quadrant measuring 7 x
4.6 x 7.3 cm
IMPRESSION: 1. Cholelithiasis without definitive sonographic evidence for acute
cholecystitis.
2. 7.3 cm right upper quadrant renal cyst

## 2022-06-23 ENCOUNTER — Other Ambulatory Visit (HOSPITAL_COMMUNITY): Payer: Self-pay | Admitting: Internal Medicine

## 2022-06-24 DIAGNOSIS — K219 Gastro-esophageal reflux disease without esophagitis: Secondary | ICD-10-CM | POA: Diagnosis not present

## 2022-06-24 DIAGNOSIS — I130 Hypertensive heart and chronic kidney disease with heart failure and stage 1 through stage 4 chronic kidney disease, or unspecified chronic kidney disease: Secondary | ICD-10-CM | POA: Diagnosis not present

## 2022-06-24 DIAGNOSIS — E785 Hyperlipidemia, unspecified: Secondary | ICD-10-CM | POA: Diagnosis not present

## 2022-06-24 DIAGNOSIS — I480 Paroxysmal atrial fibrillation: Secondary | ICD-10-CM | POA: Diagnosis not present

## 2022-06-24 DIAGNOSIS — I5020 Unspecified systolic (congestive) heart failure: Secondary | ICD-10-CM | POA: Diagnosis not present

## 2022-06-24 DIAGNOSIS — Z794 Long term (current) use of insulin: Secondary | ICD-10-CM | POA: Diagnosis not present

## 2022-06-24 DIAGNOSIS — F32A Depression, unspecified: Secondary | ICD-10-CM | POA: Diagnosis not present

## 2022-06-24 DIAGNOSIS — M8580 Other specified disorders of bone density and structure, unspecified site: Secondary | ICD-10-CM | POA: Diagnosis not present

## 2022-06-24 DIAGNOSIS — Z9181 History of falling: Secondary | ICD-10-CM | POA: Diagnosis not present

## 2022-06-24 DIAGNOSIS — I252 Old myocardial infarction: Secondary | ICD-10-CM | POA: Diagnosis not present

## 2022-06-24 DIAGNOSIS — E1122 Type 2 diabetes mellitus with diabetic chronic kidney disease: Secondary | ICD-10-CM | POA: Diagnosis not present

## 2022-06-24 DIAGNOSIS — Z853 Personal history of malignant neoplasm of breast: Secondary | ICD-10-CM | POA: Diagnosis not present

## 2022-06-24 DIAGNOSIS — M19049 Primary osteoarthritis, unspecified hand: Secondary | ICD-10-CM | POA: Diagnosis not present

## 2022-06-24 DIAGNOSIS — Z86711 Personal history of pulmonary embolism: Secondary | ICD-10-CM | POA: Diagnosis not present

## 2022-06-24 DIAGNOSIS — Z7901 Long term (current) use of anticoagulants: Secondary | ICD-10-CM | POA: Diagnosis not present

## 2022-06-24 DIAGNOSIS — G4700 Insomnia, unspecified: Secondary | ICD-10-CM | POA: Diagnosis not present

## 2022-06-24 DIAGNOSIS — Z7982 Long term (current) use of aspirin: Secondary | ICD-10-CM | POA: Diagnosis not present

## 2022-06-24 DIAGNOSIS — I255 Ischemic cardiomyopathy: Secondary | ICD-10-CM | POA: Diagnosis not present

## 2022-06-24 DIAGNOSIS — I25119 Atherosclerotic heart disease of native coronary artery with unspecified angina pectoris: Secondary | ICD-10-CM | POA: Diagnosis not present

## 2022-06-24 DIAGNOSIS — N1831 Chronic kidney disease, stage 3a: Secondary | ICD-10-CM | POA: Diagnosis not present

## 2022-07-02 ENCOUNTER — Other Ambulatory Visit (HOSPITAL_COMMUNITY): Payer: Self-pay | Admitting: Cardiology

## 2022-07-02 NOTE — Telephone Encounter (Signed)
Medication is prescribed and managed byPCP.

## 2022-07-03 ENCOUNTER — Ambulatory Visit (INDEPENDENT_AMBULATORY_CARE_PROVIDER_SITE_OTHER): Payer: Medicare Other | Admitting: Student

## 2022-07-03 ENCOUNTER — Encounter: Payer: Self-pay | Admitting: Student

## 2022-07-03 VITALS — BP 112/60 | HR 64 | Ht 62.0 in | Wt 118.0 lb

## 2022-07-03 DIAGNOSIS — N6311 Unspecified lump in the right breast, upper outer quadrant: Secondary | ICD-10-CM

## 2022-07-03 DIAGNOSIS — I5020 Unspecified systolic (congestive) heart failure: Secondary | ICD-10-CM | POA: Diagnosis not present

## 2022-07-03 DIAGNOSIS — I10 Essential (primary) hypertension: Secondary | ICD-10-CM

## 2022-07-03 DIAGNOSIS — E1169 Type 2 diabetes mellitus with other specified complication: Secondary | ICD-10-CM

## 2022-07-03 LAB — POCT GLYCOSYLATED HEMOGLOBIN (HGB A1C): HbA1c, POC (controlled diabetic range): 7.4 % — AB (ref 0.0–7.0)

## 2022-07-03 NOTE — Assessment & Plan Note (Signed)
A1c 7.4.  She is stable on 6 units of Lantus. - No changes in insulin - Recheck A1c in 3 months

## 2022-07-03 NOTE — Patient Instructions (Addendum)
It was great to see you today! Thank you for choosing [facility] for your primary care.   Today we addressed: Your mammogram, I have sent in the test. You received paperwork detailing where to go. IF you get there and have problems, give my office a call and I will work to make sure you get the scan.  Continue all you home medications, your A1C is great at 7.4.  Consider getting your flu shot and second shingles vaccine in October at your pharmacy.   If you haven't already, sign up for My Chart to have easy access to your labs results, and communication with your primary care physician.  We are checking some labs today. If they are abnormal, I will call you. If they are normal, I will send you a MyChart message (if it is active) or a letter in the mail. If you do not hear about your labs in the next 2 weeks, please call the office.   You should return to our clinic Return in about 6 months (around 01/01/2023).  I recommend that you always bring your medications to each appointment as this makes it easy to ensure you are on the correct medications and helps [place] not miss refills when you need them.  Please arrive 15 minutes before your appointment to ensure smooth check in process.  We appreciate your efforts in making this happen.  Please call the clinic at [phone] if your symptoms worsen or you have any concerns.  Thank you for allowing me to participate in your care, Dr. Sabra Heck

## 2022-07-03 NOTE — Assessment & Plan Note (Addendum)
BP 112/60.  Well-controlled with her beta-blocker, Entresto, Aldactone.  Patient denies lightheadedness, dizziness, or problems with position changes.  BMP last month WNL. - Continue all medications - Recheck BMP in 6 months

## 2022-07-03 NOTE — Assessment & Plan Note (Signed)
Well-controlled on heart failure medications. - Continue following with cardiology - Continue all medications: Aldactone, Entresto, beta-blocker, Imdur - Patient did not tolerate SGLT2 due to UTIs

## 2022-07-03 NOTE — Assessment & Plan Note (Signed)
Previous history of breast cancer.  Patient would like to continue getting mammograms annually.  Shared decision making for utility of exam.  If we were to find malignancy patient would still like to treat. - Ordered diagnostic mammogram - Continue counseling for endpoint of screening

## 2022-07-03 NOTE — Progress Notes (Signed)
    SUBJECTIVE:   CHIEF COMPLAINT / HPI:   Dana Coleman is a 81 y.o. female presenting for annual check up for her diabetes.   She is currently well controlled on 6 units of lantus daily.  Eye exam is up to date and due 05/2023  PERTINENT  PMH / PSH:  HTN A fib: Eliquis 25 mg BID, Toprol 25 mg daily  HFrEF: Aldactone, Entresto, BB, Imdur  CAD: ASA, statin, BB Hx NSTEMI  CKD3: Creatinine 1.27, GFR 43 (06/02/2022)   OBJECTIVE:   BP 112/60   Pulse 64   Ht '5\' 2"'$  (1.575 m)   Wt 118 lb (53.5 kg)   SpO2 98%   BMI 21.58 kg/m   Well-appearing, no acute distress Cardio: Regular rate, regular rhythm, no murmurs on exam. Pulm: Clear, no wheezing, no crackles. No increased work of breathing Abdominal: bowel sounds present, soft, non-tender, non-distended Extremities: no peripheral edema, no sores on her feet, intact sensation on bilateral LE   ASSESSMENT/PLAN:   Hypertension BP 112/60.  Well-controlled with her beta-blocker, Entresto, Aldactone.  Patient denies lightheadedness, dizziness, or problems with position changes.  BMP last month WNL. - Continue all medications - Recheck BMP in 6 months  HFrEF (heart failure with reduced ejection fraction) ([place]) Well-controlled on heart failure medications. - Continue following with cardiology - Continue all medications: Aldactone, Entresto, beta-blocker, Imdur - Patient did not tolerate SGLT2 due to UTIs  Type 2 diabetes mellitus with other specified complication (HCC) L4J 7.4.  She is stable on 6 units of Lantus. - No changes in insulin - Recheck A1c in 3 months  Breast lump Previous history of breast cancer.  Patient would like to continue getting mammograms annually.  Shared decision making for utility of exam.  If we were to find malignancy patient would still like to treat. - Ordered diagnostic mammogram - Continue counseling for endpoint of screening    Dana Coleman, [place]

## 2022-07-04 DIAGNOSIS — I2699 Other pulmonary embolism without acute cor pulmonale: Secondary | ICD-10-CM | POA: Diagnosis not present

## 2022-07-04 DIAGNOSIS — I25118 Atherosclerotic heart disease of native coronary artery with other forms of angina pectoris: Secondary | ICD-10-CM | POA: Diagnosis not present

## 2022-07-04 DIAGNOSIS — I5020 Unspecified systolic (congestive) heart failure: Secondary | ICD-10-CM | POA: Diagnosis not present

## 2022-07-04 DIAGNOSIS — I255 Ischemic cardiomyopathy: Secondary | ICD-10-CM | POA: Diagnosis not present

## 2022-07-07 ENCOUNTER — Other Ambulatory Visit: Payer: Self-pay | Admitting: Student

## 2022-07-07 DIAGNOSIS — N6311 Unspecified lump in the right breast, upper outer quadrant: Secondary | ICD-10-CM

## 2022-07-12 DIAGNOSIS — I4819 Other persistent atrial fibrillation: Secondary | ICD-10-CM | POA: Diagnosis not present

## 2022-07-14 ENCOUNTER — Ambulatory Visit
Admission: RE | Admit: 2022-07-14 | Discharge: 2022-07-14 | Disposition: A | Discharge status: Home / Self Care | Payer: Medicare Other | Source: Ambulatory Visit | Attending: Family Medicine | Admitting: Family Medicine

## 2022-07-14 DIAGNOSIS — N6489 Other specified disorders of breast: Secondary | ICD-10-CM | POA: Diagnosis not present

## 2022-07-14 DIAGNOSIS — N6311 Unspecified lump in the right breast, upper outer quadrant: Secondary | ICD-10-CM

## 2022-07-14 DIAGNOSIS — R928 Other abnormal and inconclusive findings on diagnostic imaging of breast: Secondary | ICD-10-CM | POA: Diagnosis not present

## 2022-07-15 ENCOUNTER — Telehealth: Payer: Self-pay | Admitting: Student

## 2022-07-15 NOTE — Telephone Encounter (Signed)
Call patient about normal mammogram results.  We will follow-up in 1 year to determine if we need to continue screening.  Darci Current, DO 07/15/22 11:09 AM

## 2022-07-23 ENCOUNTER — Other Ambulatory Visit (HOSPITAL_COMMUNITY): Payer: Self-pay | Admitting: Cardiology

## 2022-07-24 ENCOUNTER — Other Ambulatory Visit: Payer: Self-pay | Admitting: Family Medicine

## 2022-07-24 DIAGNOSIS — E785 Hyperlipidemia, unspecified: Secondary | ICD-10-CM

## 2022-08-12 DIAGNOSIS — I5022 Chronic systolic (congestive) heart failure: Secondary | ICD-10-CM | POA: Diagnosis not present

## 2022-08-23 IMAGING — DX DG CHEST 2V
2 series · 2 of 2 positions shown · non-contrast
Comparison: 12/12/2017

CLINICAL DATA: Chest pain on the right for 2 days, initial
encounter

EXAM:
CHEST - 2 VIEW

[w chest pa]
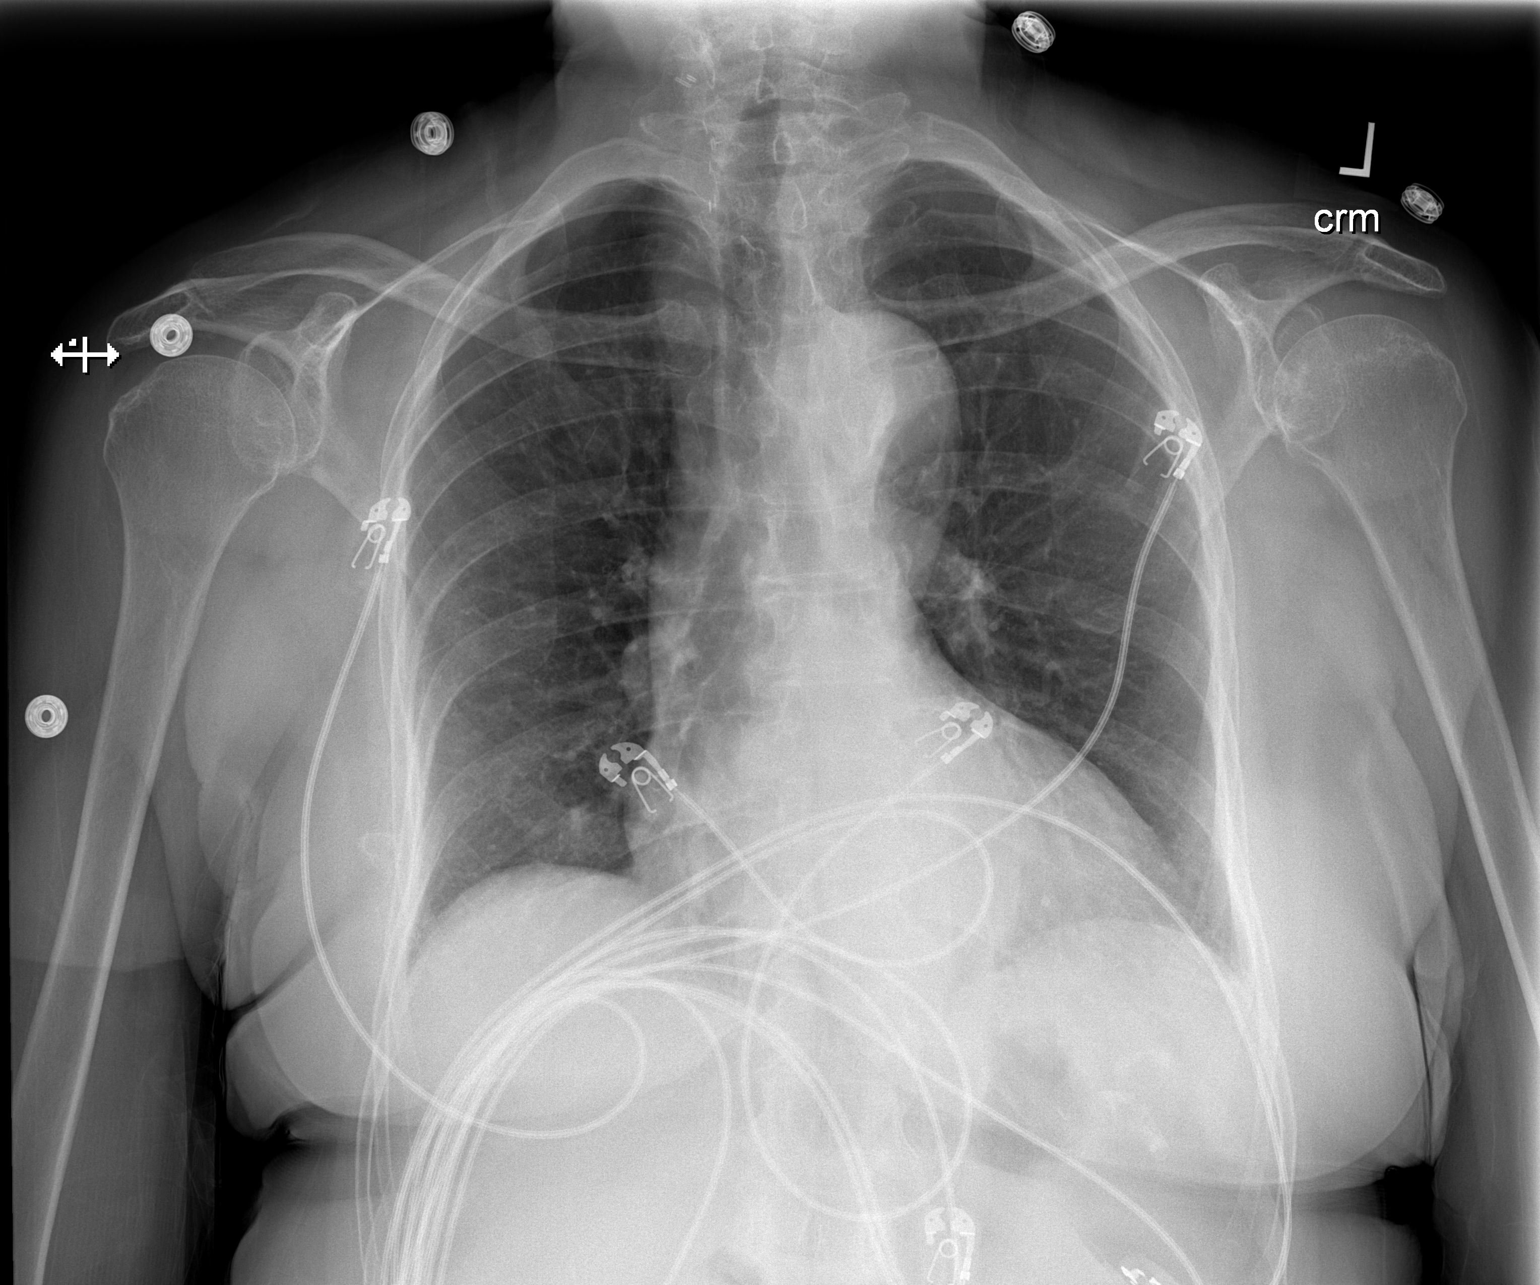

[w chest lat]
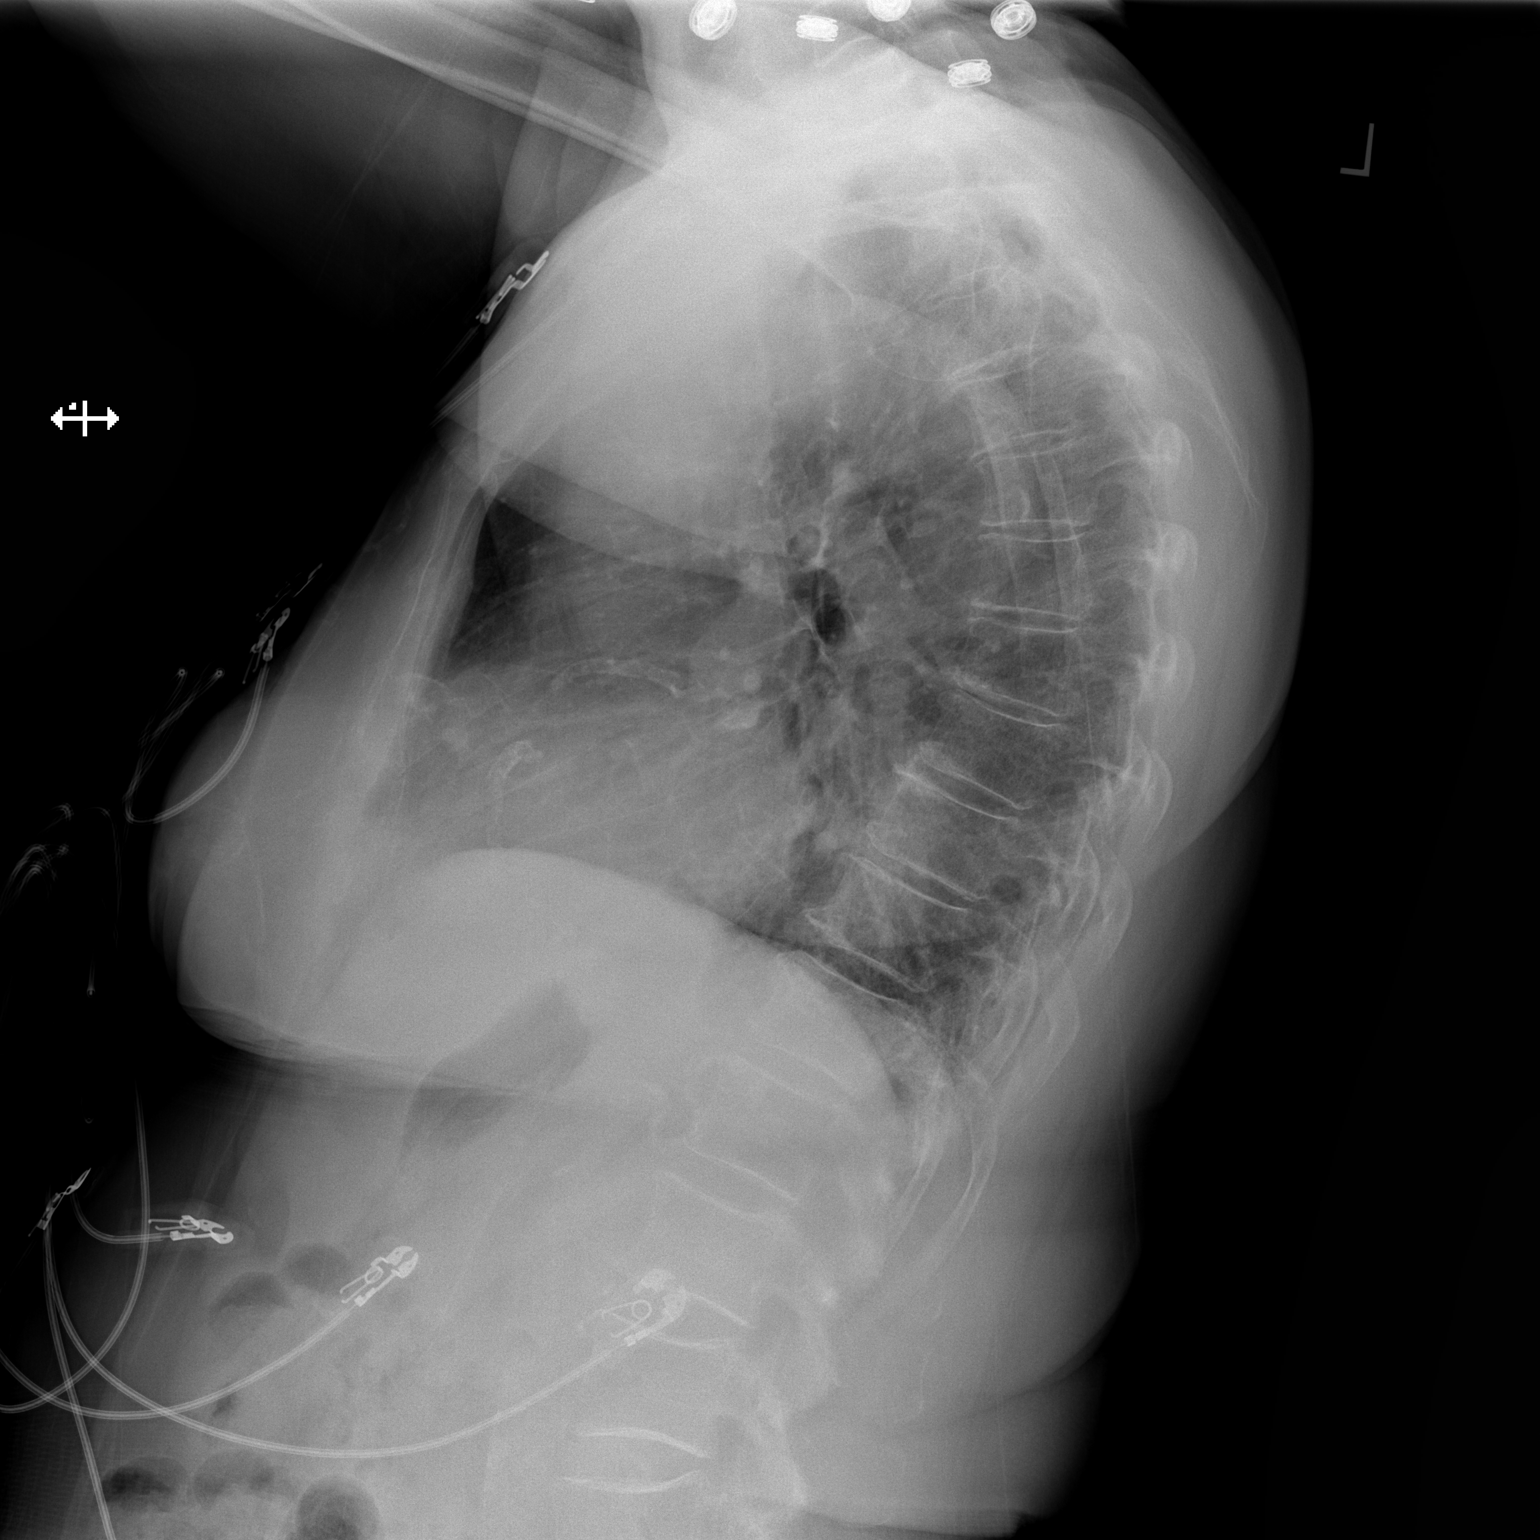

[2 of 2 positions shown; findings below may reference images not displayed]

FINDINGS: Cardiac shadow is enlarged but stable. Tortuous thoracic aorta with
calcifications is again identified. Lungs are well aerated
bilaterally and no focal infiltrate or effusion is seen.
Degenerative changes of the thoracic spine are noted.
IMPRESSION: No acute abnormality noted.

## 2022-08-27 ENCOUNTER — Other Ambulatory Visit: Payer: Self-pay | Admitting: Family Medicine

## 2022-09-09 ENCOUNTER — Encounter: Payer: Self-pay | Admitting: Family Medicine

## 2022-09-09 ENCOUNTER — Ambulatory Visit (INDEPENDENT_AMBULATORY_CARE_PROVIDER_SITE_OTHER): Payer: Medicare Other | Admitting: Family Medicine

## 2022-09-09 ENCOUNTER — Telehealth: Payer: Self-pay | Admitting: Family Medicine

## 2022-09-09 VITALS — BP 151/72 | HR 78 | Temp 99.0°F | Wt 126.0 lb

## 2022-09-09 DIAGNOSIS — J019 Acute sinusitis, unspecified: Secondary | ICD-10-CM

## 2022-09-09 DIAGNOSIS — R0981 Nasal congestion: Secondary | ICD-10-CM | POA: Diagnosis not present

## 2022-09-09 LAB — POCT INFLUENZA A/B
Influenza A, POC: NEGATIVE
Influenza B, POC: NEGATIVE

## 2022-09-09 MED ORDER — FLUTICASONE PROPIONATE 50 MCG/ACT NA SUSP: 1.0000 | Freq: Every day | NASAL | 12 refills | Status: DC | PRN

## 2022-09-09 NOTE — Telephone Encounter (Signed)
Attempt to call patient with flu test results which were negative.  No answer, left voicemail to have her call the clinic to go over results.  If patient calls back, you can let her know that her flu test was negative.

## 2022-09-09 NOTE — Patient Instructions (Addendum)
It was nice seeing you today!  For your congestion, I recommend that you try a nasal spray called Flonase.  If your symptoms are not getting better in 1 week, please give me a call and we can talk about possibly treating you with antibiotics.  You are due to see your primary care provider in 1 month for diabetes check.  Stay well, Zola Button, MD [place] [phone]  --  Make sure to check out at the front desk before you leave today.  Please arrive at least 15 minutes prior to your scheduled appointments.  If you had blood work today, I will send you a MyChart message or a letter if results are normal. Otherwise, I will give you a call.  If you had a referral placed, they will call you to set up an appointment. Please give [place] a call if you don't hear back in the next 2 weeks.  If you need additional refills before your next appointment, please call your pharmacy first.

## 2022-09-09 NOTE — Progress Notes (Signed)
    SUBJECTIVE:   CHIEF COMPLAINT / HPI:  Chief Complaint  Patient presents with   Sinus Problem    Patient started feeling ill 3 days ago with congestion, sinus pressure, headache, cough, sore throat. She has been taking Tylenol sometimes. Denies fever, chills, body aches, chest pain, SOB. She has been around some sick family members over Thanksgiving.   PERTINENT  PMH / PSH: CHF, CAD, HTN, HLD, paroxysmal A-fib, CKD 3, T2DM  Patient Care Team: Darci Current, DO as PCP - General (Family Medicine) Luberta Mutter, MD (Ophthalmology) Jacolyn Reedy, MD (Cardiology)   OBJECTIVE:   BP (!) 151/72   Pulse 78   Temp 99 F (37.2 C) (Oral)   Wt 126 lb (57.2 kg)   SpO2 100%   BMI 23.05 kg/m   Physical Exam Constitutional:      General: She is not in acute distress. HENT:     Head: Normocephalic and atraumatic.     Comments: Left-sided frontal and maxillary sinus tenderness    Nose: Congestion present.     Mouth/Throat:     Mouth: Mucous membranes are moist.     Pharynx: Oropharynx is clear. No oropharyngeal exudate or posterior oropharyngeal erythema.  Cardiovascular:     Rate and Rhythm: Normal rate and regular rhythm.  Pulmonary:     Effort: Pulmonary effort is normal. No respiratory distress.     Breath sounds: Normal breath sounds.  Musculoskeletal:     Cervical back: Neck supple.     Right lower leg: No edema.     Left lower leg: No edema.  Lymphadenopathy:     Cervical: No cervical adenopathy.  Neurological:     Mental Status: She is alert.        09/09/2022    2:05 PM  Depression screen PHQ 2/9  Decreased Interest 0  Down, Depressed, Hopeless 0  PHQ - 2 Score 0  Tired, decreased energy 1  Change in appetite 0  Feeling bad or failure about yourself  0  Trouble concentrating 0  Moving slowly or fidgety/restless 0  Suicidal thoughts 0     {Show previous vital signs (optional):23777}    ASSESSMENT/PLAN:   Acute non-recurrent sinusitis Viral  prodrome of symptoms ongoing for 3 days.  Afebrile, doubt bacterial etiology.  Most likely viral. - Covid test - influenza A/B negative - supportive care, can try Flonase for congestion - advised to call in 1 week if not improving, could consider antibiotic treatment for bacterial sinusitis  Return if symptoms worsen or fail to improve.   Zola Button, MD [place]

## 2022-09-10 LAB — SPECIMEN STATUS REPORT

## 2022-09-10 LAB — NOVEL CORONAVIRUS, NAA: SARS-CoV-2, NAA: NOT DETECTED

## 2022-09-11 DIAGNOSIS — I2510 Atherosclerotic heart disease of native coronary artery without angina pectoris: Secondary | ICD-10-CM | POA: Diagnosis not present

## 2022-09-11 DIAGNOSIS — I5022 Chronic systolic (congestive) heart failure: Secondary | ICD-10-CM | POA: Diagnosis not present

## 2022-09-11 NOTE — Progress Notes (Signed)
Contacted the pt to let her know that her flu and covid both came back negative. Pt understood

## 2022-09-17 ENCOUNTER — Other Ambulatory Visit (HOSPITAL_COMMUNITY): Payer: Self-pay | Admitting: Family Medicine

## 2022-10-03 ENCOUNTER — Other Ambulatory Visit: Payer: Self-pay | Admitting: Family Medicine

## 2022-10-03 DIAGNOSIS — I10 Essential (primary) hypertension: Secondary | ICD-10-CM | POA: Diagnosis not present

## 2022-10-03 DIAGNOSIS — I255 Ischemic cardiomyopathy: Secondary | ICD-10-CM | POA: Diagnosis not present

## 2022-10-03 DIAGNOSIS — I5022 Chronic systolic (congestive) heart failure: Secondary | ICD-10-CM | POA: Diagnosis not present

## 2022-10-03 DIAGNOSIS — I25118 Atherosclerotic heart disease of native coronary artery with other forms of angina pectoris: Secondary | ICD-10-CM | POA: Diagnosis not present

## 2022-10-03 DIAGNOSIS — E108 Type 1 diabetes mellitus with unspecified complications: Secondary | ICD-10-CM

## 2022-10-07 IMAGING — DX DG CHEST 1V PORT
1 series · 1 of 1 positions shown · non-contrast
Comparison: 05/12/2021

CLINICAL DATA: Chest tube.  Status post esophageal perforation.

EXAM:
PORTABLE CHEST 1 VIEW

[chest ap]
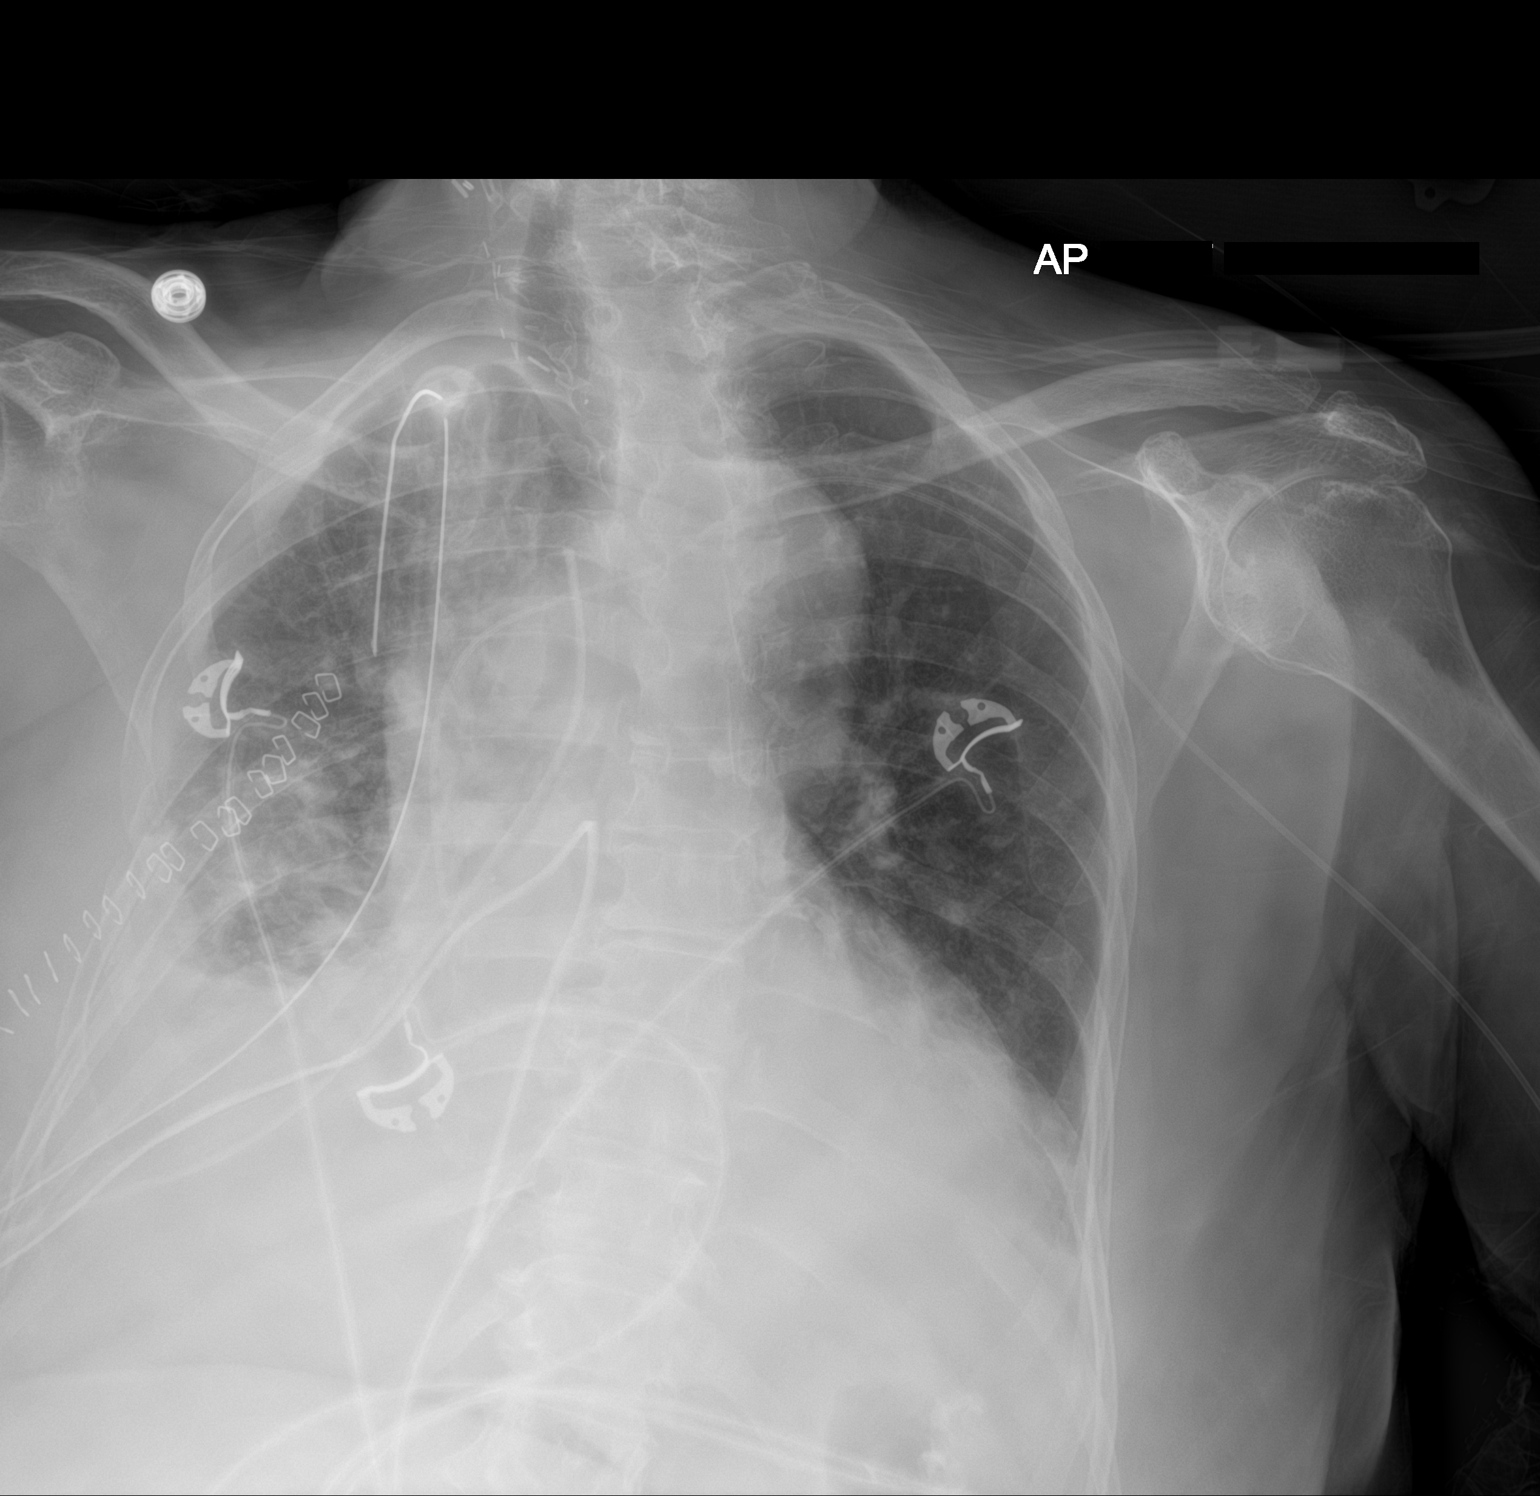

[1 of 1 positions shown; findings below may reference images not displayed]

FINDINGS: Right chest tubes remain in place, unchanged. No pneumothorax.
Volume loss on the right with airspace disease in the right mid and
lower lung. Possible small right effusion. Left basilar atelectasis
or infiltrate. No significant change since prior study.
IMPRESSION: No significant change.  No pneumothorax.

## 2022-10-09 DIAGNOSIS — E119 Type 2 diabetes mellitus without complications: Secondary | ICD-10-CM | POA: Diagnosis not present

## 2022-10-09 DIAGNOSIS — I10 Essential (primary) hypertension: Secondary | ICD-10-CM | POA: Diagnosis not present

## 2022-10-09 DIAGNOSIS — I2510 Atherosclerotic heart disease of native coronary artery without angina pectoris: Secondary | ICD-10-CM | POA: Diagnosis not present

## 2022-10-09 DIAGNOSIS — E785 Hyperlipidemia, unspecified: Secondary | ICD-10-CM | POA: Diagnosis not present

## 2022-10-10 IMAGING — DX DG CHEST 1V PORT
1 series · 1 of 1 positions shown · non-contrast
Comparison: 05/15/2021.

CLINICAL DATA: History of esophageal perforation.

EXAM:
PORTABLE CHEST 1 VIEW

[chest]
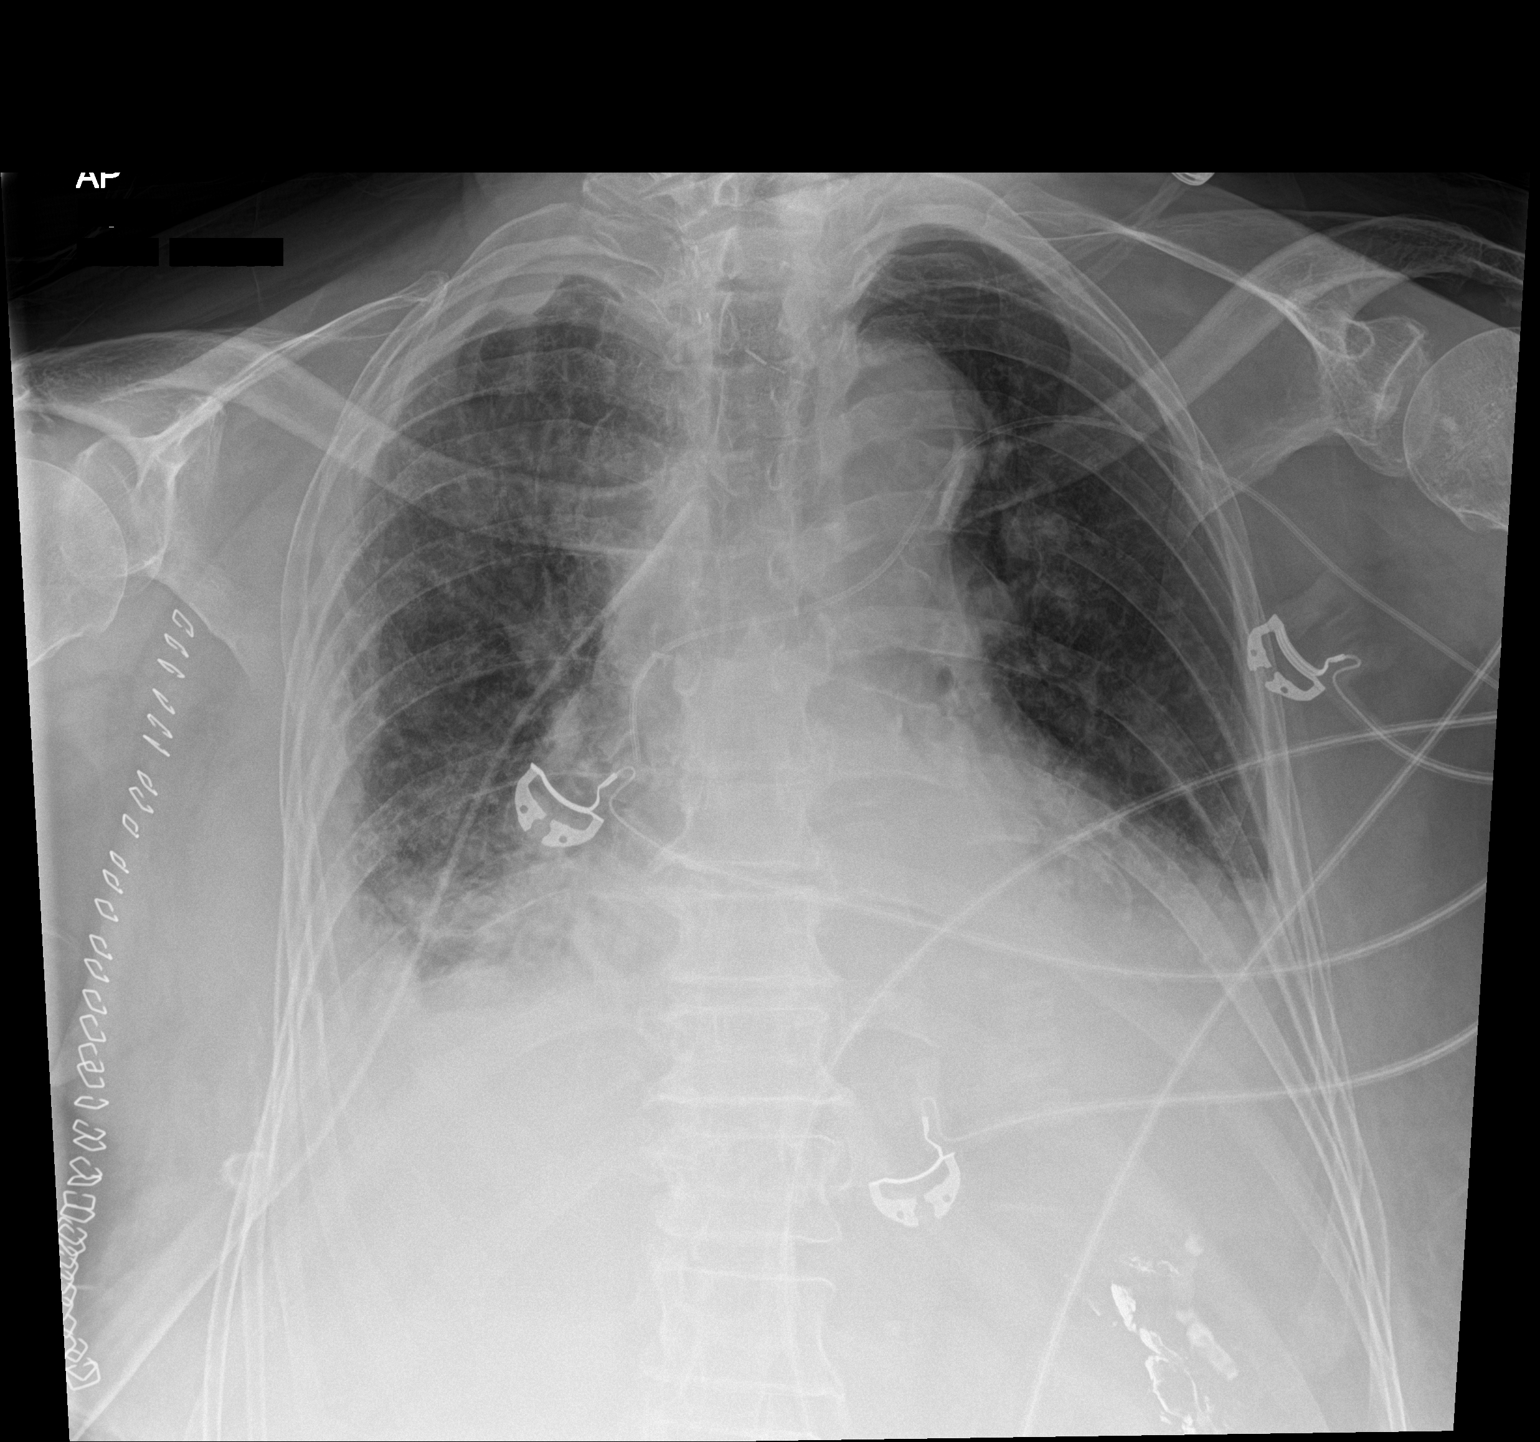

[1 of 1 positions shown; findings below may reference images not displayed]

FINDINGS: Interval removal of right lower chest tube. Right upper chest tube
remains. No pneumothorax. Left PICC line stable position.
Cardiomegaly again noted. Mild bilateral interstitial prominence and
bilateral pleural effusions noted. Mild CHF cannot be excluded.
Surgical staples right chest. Surgical clips noted over the upper
mid chest. Contrast in the stomach. Degenerative change thoracic
spine.
IMPRESSION: 1. Interim removal of right lower chest tube. Right upper chest tube
in stable position. No pneumothorax. Left PICC line stable position.

2. Cardiomegaly with mild bilateral interstitial prominence and
bilateral pleural effusions suggesting mild CHF.

## 2022-10-11 DIAGNOSIS — I5022 Chronic systolic (congestive) heart failure: Secondary | ICD-10-CM | POA: Diagnosis not present

## 2022-10-11 DIAGNOSIS — I2510 Atherosclerotic heart disease of native coronary artery without angina pectoris: Secondary | ICD-10-CM | POA: Diagnosis not present

## 2022-10-18 ENCOUNTER — Other Ambulatory Visit (HOSPITAL_COMMUNITY): Payer: Self-pay | Admitting: Internal Medicine

## 2022-11-03 ENCOUNTER — Other Ambulatory Visit (HOSPITAL_COMMUNITY): Payer: Self-pay | Admitting: Cardiology

## 2022-11-03 ENCOUNTER — Other Ambulatory Visit (HOSPITAL_COMMUNITY): Payer: Self-pay | Admitting: Internal Medicine

## 2022-11-03 ENCOUNTER — Other Ambulatory Visit: Payer: Self-pay | Admitting: Family Medicine

## 2022-11-03 DIAGNOSIS — E785 Hyperlipidemia, unspecified: Secondary | ICD-10-CM

## 2022-11-10 DIAGNOSIS — I5022 Chronic systolic (congestive) heart failure: Secondary | ICD-10-CM | POA: Diagnosis not present

## 2022-11-10 DIAGNOSIS — I2510 Atherosclerotic heart disease of native coronary artery without angina pectoris: Secondary | ICD-10-CM | POA: Diagnosis not present

## 2022-11-12 DIAGNOSIS — I5022 Chronic systolic (congestive) heart failure: Secondary | ICD-10-CM | POA: Diagnosis not present

## 2022-11-12 DIAGNOSIS — I2510 Atherosclerotic heart disease of native coronary artery without angina pectoris: Secondary | ICD-10-CM | POA: Diagnosis not present

## 2022-11-13 DIAGNOSIS — R350 Frequency of micturition: Secondary | ICD-10-CM | POA: Diagnosis not present

## 2022-11-13 DIAGNOSIS — N281 Cyst of kidney, acquired: Secondary | ICD-10-CM | POA: Diagnosis not present

## 2022-11-27 DIAGNOSIS — N281 Cyst of kidney, acquired: Secondary | ICD-10-CM | POA: Diagnosis not present

## 2022-12-01 ENCOUNTER — Other Ambulatory Visit: Payer: Self-pay | Admitting: Family Medicine

## 2022-12-01 ENCOUNTER — Other Ambulatory Visit (HOSPITAL_COMMUNITY): Payer: Self-pay | Admitting: Internal Medicine

## 2022-12-01 ENCOUNTER — Ambulatory Visit (HOSPITAL_COMMUNITY)
Admission: RE | Admit: 2022-12-01 | Discharge: 2022-12-01 | Disposition: A | Discharge status: Home / Self Care | Payer: 59 | Source: Ambulatory Visit | Attending: Family Medicine | Admitting: Family Medicine

## 2022-12-01 ENCOUNTER — Other Ambulatory Visit (HOSPITAL_COMMUNITY): Payer: Self-pay | Admitting: Cardiology

## 2022-12-01 ENCOUNTER — Encounter (HOSPITAL_COMMUNITY): Payer: Self-pay

## 2022-12-01 ENCOUNTER — Other Ambulatory Visit (HOSPITAL_COMMUNITY): Payer: Self-pay | Admitting: Family Medicine

## 2022-12-01 ENCOUNTER — Other Ambulatory Visit: Payer: Self-pay

## 2022-12-01 VITALS — BP 110/66 | HR 65 | Wt 128.2 lb

## 2022-12-01 DIAGNOSIS — I10 Essential (primary) hypertension: Secondary | ICD-10-CM | POA: Diagnosis not present

## 2022-12-01 DIAGNOSIS — Z86711 Personal history of pulmonary embolism: Secondary | ICD-10-CM | POA: Diagnosis not present

## 2022-12-01 DIAGNOSIS — Z955 Presence of coronary angioplasty implant and graft: Secondary | ICD-10-CM | POA: Diagnosis not present

## 2022-12-01 DIAGNOSIS — Z7901 Long term (current) use of anticoagulants: Secondary | ICD-10-CM | POA: Diagnosis not present

## 2022-12-01 DIAGNOSIS — R6521 Severe sepsis with septic shock: Secondary | ICD-10-CM | POA: Diagnosis not present

## 2022-12-01 DIAGNOSIS — E785 Hyperlipidemia, unspecified: Secondary | ICD-10-CM | POA: Insufficient documentation

## 2022-12-01 DIAGNOSIS — Z794 Long term (current) use of insulin: Secondary | ICD-10-CM | POA: Diagnosis not present

## 2022-12-01 DIAGNOSIS — I2510 Atherosclerotic heart disease of native coronary artery without angina pectoris: Secondary | ICD-10-CM | POA: Insufficient documentation

## 2022-12-01 DIAGNOSIS — I5022 Chronic systolic (congestive) heart failure: Secondary | ICD-10-CM

## 2022-12-01 DIAGNOSIS — Z9011 Acquired absence of right breast and nipple: Secondary | ICD-10-CM | POA: Diagnosis not present

## 2022-12-01 DIAGNOSIS — I252 Old myocardial infarction: Secondary | ICD-10-CM | POA: Diagnosis not present

## 2022-12-01 DIAGNOSIS — I5082 Biventricular heart failure: Secondary | ICD-10-CM | POA: Diagnosis not present

## 2022-12-01 DIAGNOSIS — I480 Paroxysmal atrial fibrillation: Secondary | ICD-10-CM | POA: Insufficient documentation

## 2022-12-01 DIAGNOSIS — Z853 Personal history of malignant neoplasm of breast: Secondary | ICD-10-CM | POA: Insufficient documentation

## 2022-12-01 DIAGNOSIS — Z79899 Other long term (current) drug therapy: Secondary | ICD-10-CM | POA: Insufficient documentation

## 2022-12-01 DIAGNOSIS — E119 Type 2 diabetes mellitus without complications: Secondary | ICD-10-CM | POA: Diagnosis not present

## 2022-12-01 DIAGNOSIS — Z7982 Long term (current) use of aspirin: Secondary | ICD-10-CM | POA: Diagnosis not present

## 2022-12-01 DIAGNOSIS — Z923 Personal history of irradiation: Secondary | ICD-10-CM | POA: Insufficient documentation

## 2022-12-01 DIAGNOSIS — Z87440 Personal history of urinary (tract) infections: Secondary | ICD-10-CM | POA: Insufficient documentation

## 2022-12-01 DIAGNOSIS — I110 Hypertensive heart disease with heart failure: Secondary | ICD-10-CM | POA: Insufficient documentation

## 2022-12-01 LAB — CBC
HCT: 35.2 % — ABNORMAL LOW (ref 36.0–46.0)
Hemoglobin: 11.1 g/dL — ABNORMAL LOW (ref 12.0–15.0)
MCH: 30.7 pg (ref 26.0–34.0)
MCHC: 31.5 g/dL (ref 30.0–36.0)
MCV: 97.5 fL (ref 80.0–100.0)
Platelets: 139 10*3/uL — ABNORMAL LOW (ref 150–400)
RBC: 3.61 MIL/uL — ABNORMAL LOW (ref 3.87–5.11)
RDW: 13.2 % (ref 11.5–15.5)
WBC: 6.3 10*3/uL (ref 4.0–10.5)
nRBC: 0 % (ref 0.0–0.2)

## 2022-12-01 LAB — BASIC METABOLIC PANEL
Anion gap: 8 (ref 5–15)
BUN: 17 mg/dL (ref 8–23)
CO2: 27 mmol/L (ref 22–32)
Calcium: 8.9 mg/dL (ref 8.9–10.3)
Chloride: 103 mmol/L (ref 98–111)
Creatinine, Ser: 1.17 mg/dL — ABNORMAL HIGH (ref 0.44–1.00)
GFR, Estimated: 47 mL/min — ABNORMAL LOW (ref >60)
Glucose, Bld: 245 mg/dL — ABNORMAL HIGH (ref 70–99)
Potassium: 4.7 mmol/L (ref 3.5–5.1)
Sodium: 138 mmol/L (ref 135–145)

## 2022-12-01 LAB — BRAIN NATRIURETIC PEPTIDE: B Natriuretic Peptide: 267.4 pg/mL — ABNORMAL HIGH (ref 0.0–100.0)

## 2022-12-01 MED ORDER — APIXABAN 2.5 MG PO TABS: 2.5000 mg | ORAL_TABLET | Freq: Two times a day (BID) | ORAL | 8 refills | Status: DC

## 2022-12-01 NOTE — Progress Notes (Signed)
ADVANCED HF CLINIC NOTE  Primary Care: Darci Current, DO Primary Cardiologist: Dr. Terrence Dupont HF Cardiologist: Dr. Haroldine Laws  HPI: Dana Coleman is a 82 y.o. female w/ h/o CAD, s/p remote inferior wall MI in 2000 treated w/ PCI to RCA, hypertension, type 2DM, HLD and h/o right sided breast cancer treated w/ radiation and mastectomy, no chemotherapy.    Followed by Dr. Terrence Dupont Echo 2019 EF 55-60%, G1D, RV normal  NST 2019 c/w prior inferior myocardial infarction. Small mild distal anterior/apical infarct with mild peri-infarct ischemia>> Low risk study    She was extremely sick 04/2021 w/ nearly 6 wk hospitalization. She had laparoscopic cholecystectomy on 05/02/21, discharged home, developed abdominal pain associated with sudden onset of altered mental status noted to be hypotensive, bradycardic and hypoxic by EMS requiring intubation and subsequently was noted to have a right empyema and perforation of the esophagus requiring emergent thoracotomy w/ repair of esophageal perforation with intercostal pedicled muscle flap, drainage of empyema and decortication of right lung on 7/24.    7/25, she developed EKG changes w/ minimal inferior ST elevation and troponin leak. Echo showed moderately reduced LVEF, 40%, and severely reduced RV systolic function. However given recent surgery, she was not a candidate for intervention. She later developed increased O2 requirements, subsequently had CT angiogram which showed bilateral pulmonary embolism.  Repeat limited echo showed EF 25-30%, RV normal. No evidence of significant strain>>>IV heparin>>Eliquis. Hospitalization also notable for atrial flutter. Dr. Terrence Dupont followed during hospitalization.    Of note EKG 1/23 showed afib w/ CVR. F/u EKGs 3/23 showed NSR.   Admit 3/23 for a/c CHF. Remained in NSR during admission. Echo EF 25-30%, RV moderately reduced right ventricular systolic pressure [id number], mild MR. Diuresed 9 lb down to a dry wt of 119 lb.  Discharged home on lasix, Entresto and Toprol XL. Referred to [facility] clinic. Seen in [facility] 4/23, remained volume overloaded, Entresto increased to goal dose and Lasix started.   Follow up 5/23, NYHA II-early III, volume stable.  Admitted 6/23 with CP, cardiology consulted. CHF compensated and underwent nuclear stress test which showed LVEF 25%, large fixed defect involving apical and inferior myocardium consistent with old infarction. Hospitalization c/b dizziness and low BP and hydralazine was stopped, Entresto reduced to 24/26. She was discharged home with [facility] PT/OT/RN, weight 118 lbs.  Echo 06/02/22 EF 30-35% previous inferior, inferolateral MI   Follow up 8/23, NYHA II early III, volume stable. ICD deferred given age and overall situation.  Today she returns for HF follow up. Overall feeling fine. Feels like she has fluid in her abdomen. She has SOB walking longer distances on flat ground, she walks with a cane. No falls. Denies palpitations,  CP, dizziness, edema, or PND/Orthopnea. Appetite ok. No fever or chills. Weight at home 122-123 pounds. Taking all medications. She lives alone but her siblings live down the street and help her out. Saw Dr. Terrence Dupont 10/2022 and Lasix increased to 80 mg daily.  Cardiac Testing   - Echo (3/19): EF 55-60%, G1D, RV normal    - NST 12/2017  There was no ST segment deviation noted during stress. T wave inversion was noted during stress in the III leads, beginning at 1 minutes of stress. T wave inversion persisted. Findings consistent with prior inferior myocardial infarction. Small mild distal anterior/apical infarct with mild peri-infarct ischemia. This is a low risk study. Mild area of current ischemia. The left ventricular ejection fraction is normal (55-65%).     - Echo (7/22):  EF 45%, RV severely reduced   - Ltd Echo (8/22): EF 25-30%, RV normal   - Echo (3/23): EF 25-30%, RV moderately reduced right ventricular systolic pressure is [id number] mmHg, mild MR    - Lexiscan myoview (6/23): LVEF 25%, large fixed defect apical and inferior myocardium, consistent with old infarction.   - Echo (8/23): EF 30-35%, previous inferior, inferolateral MI.   Past Medical History:  Diagnosis Date   Acute pulmonary edema (HCC)    Acute respiratory failure with hypoxemia (HCC)    Acute respiratory failure with hypoxia (HCC)    Allergy    occ uses OTC allergy meds    Arthritis    fingers   Breast cancer ([place]) 04/2008   Right breast   Cataracts, bilateral    removed bilat    Cervical cancer ([place])    When the patient was in her 78s   Depression    Diabetes mellitus without complication ([place])    Empyema ([place])    Esophageal perforation 05/05/2021   Failure to thrive in adult 07/22/2021   Wt Readings from Last 3 Encounters: 06/26/21 65.8 kg 06/17/21 64.8 kg 04/26/21 58.1 kg     Gastrostomy tube in place [facility]) 08/21/2021   Heart attack ([place]) 2004   History of cervical cancer    S/p radiation and surgical removal in 30's.  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 Truddie Coco   PEG (percutaneous endoscopic gastrostomy) status ([place]) 07/23/2021   Personal history of radiation therapy 2009   Pressure injury of skin 05/05/2021   Protein-calorie malnutrition, severe 05/15/2021   Pyelonephritis 11/06/2021   Respiratory failure ([place]) 05/04/2021   S/P thoracotomy    UTI (urinary tract infection) 11/05/2021    Current Outpatient Medications  Medication Sig Dispense Refill   acetaminophen (TYLENOL) 500 MG tablet Take 500 mg by mouth every 6 (six) hours as needed for moderate pain.     ascorbic acid (VITAMIN C) 500 MG tablet Take 500 mg by mouth daily.     aspirin EC 81 MG tablet Take 81 mg by mouth in the morning.  Swallow whole.     Blood Glucose Monitoring Suppl MISC daily.     ELIQUIS 2.5 MG TABS tablet TAKE 1 TABLET BY MOUTH TWICE A DAY 60 tablet 4   fluticasone (FLONASE) 50 MCG/ACT nasal spray Place 1 spray into both nostrils daily as needed for allergies or rhinitis. 1 spray in each nostril every day 16 g 12   furosemide (LASIX) 80 MG tablet Take 80 mg by mouth daily.     glucose blood (ONE TOUCH ULTRA TEST) test strip CHECK BLOOD SUGAR TWICE DAILY 100 each 1   insulin glargine (LANTUS SOLOSTAR) 100 UNIT/ML Solostar Pen INJECT 24 UNITS INTO THE SKIN EVERY MORNING. 15 mL 9   Insulin Pen Needle (B-D UF III MINI PEN NEEDLES) 31G X 5 MM MISC INJECT INSULIN VIA PEN 6 TIMES DAILY 200 each 3   isosorbide mononitrate (IMDUR) 30 MG 24 hr tablet TAKE 1 TABLET BY MOUTH EVERY DAY 90 tablet 3   Lancets (ONETOUCH DELICA PLUS 123XX123) MISC APPLY ROUTE AS NEEDED. 100 each PRN   Melatonin 3 MG TBDP Take 6 mg by mouth  at bedtime as needed (sleep).     metoprolol succinate (TOPROL-XL) 25 MG 24 hr tablet TAKE 1/2 TABLET BY MOUTH EVERY DAY 45 tablet 1   nitroGLYCERIN (NITROSTAT) 0.4 MG SL tablet Place 0.4 mg under the tongue every 5 (five) minutes as needed for chest pain.     omeprazole (PRILOSEC) 20 MG capsule Take 40 mg by mouth every morning.     rosuvastatin (CRESTOR) 20 MG tablet TAKE 1 TABLET BY MOUTH EVERYDAY AT BEDTIME 90 tablet 1   sacubitril-valsartan (ENTRESTO) 24-26 MG Take 1 tablet by mouth 2 (two) times daily. 60 tablet 2   spironolactone (ALDACTONE) 25 MG tablet Take 0.5 tablets (12.5 mg total) by mouth daily. 90 tablet 1   traZODone (DESYREL) 50 MG tablet Take 50 mg by mouth at bedtime as needed for sleep.     No current facility-administered medications for this encounter.   No Known Allergies  Social History   Socioeconomic History   Marital status: Divorced    Spouse name: Not on file   Number of children: 5   Years of education: 10   Highest education level: 10th grade  Occupational  History   Occupation: Retired- IT trainer  Tobacco Use   Smoking status: Never   Smokeless tobacco: Never   Tobacco comments:    No plans to start  Vaping Use   Vaping Use: Never used  Substance and Sexual Activity   Alcohol use: No   Drug use: No   Sexual activity: Not Currently    Birth control/protection: Condom  Other Topics Concern   Not on file  Social History Narrative   Patient lives alone in [place].   Patient is close with daughters Solmon Ice and [place]. One Son in [place]. 2 children have passed.    Patient depends on her sister and brother for support and any assistance needed.    [name] (sister) [phone].   Religious / Personal Beliefs: Baptist   Patient does not drive.   Diet: Pt has a varied diet of protein, starch, and vegetables and fruits.   Seatbelts: Pt reports wearing seatbelt when in vehicles.    Hobbies: shopping, visiting family, church   Education / Work:  10th grade/ [employer] [place] Strain: [place]  (10/17/2021)   Overall Financial Resource Strain (CARDIA)    Difficulty of Paying Living Expenses: Not hard at all  Food Insecurity: No Food Insecurity (10/17/2021)   Hunger Vital Sign    Worried About Running Out of Food in the Last Year: Never true    [place] in the Last Year: Never true  Transportation Needs: No Transportation Needs (10/17/2021)   PRAPARE - Hydrologist (Medical): No    Lack of Transportation (Non-Medical): No  Physical Activity: Insufficiently Active (10/17/2021)   Exercise Vital Sign    Days of Exercise per Week: 1 day    Minutes of Exercise per Session: 40 min  Stress: No Stress Concern Present (10/17/2021)   [place]    Feeling of Stress :  Not at all  Social Connections: Moderately Isolated  (10/17/2021)   Social Connection and Isolation Panel [NHANES]    Frequency of Communication with Friends and Family: More than three times a week    Frequency of Social Gatherings with Friends and Family: More than three times a week    Attends Religious Services: More than 4 times per year    Active Member of [employer] or Organizations: No    Attends Archivist Meetings: Never    Marital Status: Divorced  Human resources officer Violence: Not At Risk (10/17/2021)   Humiliation, Afraid, Rape, and Kick questionnaire    Fear of Current or Ex-Partner: No    Emotionally Abused: No    Physically Abused: No    Sexually Abused: No   Family History  Problem Relation Age of Onset   Heart disease Mother        age 51   Heart disease Father    Cancer Father        unknown   Heart disease Brother    Hypertension Brother    Cirrhosis Brother    Diabetes Daughter    Stroke Daughter    Kidney disease Daughter    Hypertension Son    Colon cancer Neg Hx    Colon polyps Neg Hx    Esophageal cancer Neg Hx    Stomach cancer Neg Hx    Rectal cancer Neg Hx    BP 110/66   Pulse 65   Wt 58.2 kg (128 lb 3.2 oz)   SpO2 99%   BMI 23.45 kg/m   Wt Readings from Last 3 Encounters:  12/01/22 58.2 kg (128 lb 3.2 oz)  09/09/22 57.2 kg (126 lb)  07/03/22 53.5 kg (118 lb)   PHYSICAL EXAM: General:  NAD. No resp difficulty HEENT: Normal Neck: Supple. No JVD. Carotids 2+ bilat; no bruits. No lymphadenopathy or thryomegaly appreciated. Cor: PMI nondisplaced. Regular rate & rhythm. No rubs, gallops or murmurs. Lungs: Clear Abdomen: Soft, nontender, nondistended. No hepatosplenomegaly. No bruits or masses. Good bowel sounds. Extremities: No cyanosis, clubbing, rash, edema Neuro: Alert & oriented x 3, cranial nerves grossly intact. Moves all 4 extremities w/o difficulty. Affect pleasant.  ECG (personally reviewed): NSR with rBBB 61 bpm  REDs: unable to obtain  ASSESSMENT & PLAN: Chronic Biventricular  Heart Failure - Etiology uncertain, possible ischemic CM given known h/o CAD, though no recent CP. Remote inferior MI in 2000 w/ RCA PCI. Cath report unavailable, uncertain if any additional disease  - Echo (2019): showed normal LVEF, 55-60%, and normal RV - drop in EF 7/22, down to 25-30% w/ mod-severely reduced RV, in the setting of severe illness from septic shock, hypoxic respiratory failure, in setting of rt lung empyema and esophageal perforation requiring emergency surgery, c/b perioperative PE and NSTEMI (medically managed in setting of severe illness) - Echo (3/23): EF 25-30%, RV moderately reduced. - Echo 06/02/22 EF 30-35% previous inferior, inferolateral MI - NYHA II-early III, functional class difficult due to deconditioning. Volume looks OK today on exam. GDMT has been limited by low BP. - Continue spiro 12.5 mg daily. Will not increase today w/ borderline K. - Continue Lasix 80 mg daily. - Continue Entresto 24/26 mg bid. - Continue Toprol XL 12.5 mg daily. No titration w/HR 61 today. - Continue Imdur 30 mg daily.  - Intolerant of SGLT2i due to UTIs (previously tried [place]). - No digoxin w/ bradycardia.  - Not ICD candidate due to advanced age. - Labs today.  2. CAD  - h/o CAD, s/p remote inferior wall MI in 2000 treated w/ PCI to RCA - NST 2019 c/w prior inferior myocardial infarction. Small mild distal anterior/apical infarct with mild peri-infarct ischemia>> Low risk study.  - Admit 6/23 with CP. Lexiscan (6/23) showed EF 25%, large fixed defect involving apical and inferior myocardium consistent with old infarction.  - Patient followed by Dr. Terrence Dupont, medical management for now. - Continue ASA, statin and ? blocker.   3. PAF - NSR on ECG today. - Continue Toprol XL.  - Continue Eliquis, no bleeding issues.   4. H/o PE - On Eliquis.  No bleeding issues - CBC today.   5. Hypertension  - Well-controlled. Meds recently cut back due to low BP. - GDMT as above.    6. Type 2DM  - Managed by PCP - On insulin. - Failed SGLT2i due to frequent UTIs    Follow up in 6 months with Dr. Haroldine Laws  Rafael Bihari, FNP  2:52 PM

## 2022-12-01 NOTE — Patient Instructions (Addendum)
Thank you for coming in today  Labs were done today, if any labs are abnormal the clinic will call you No news is good news  Your physician recommends that you schedule a follow-up appointment in:  6 months with Dr. Haroldine Laws You will receive a reminder letter in the mail a few months in advance. If you don't receive a letter, please call our office to schedule the follow-up appointment.     Do the following things EVERYDAY: Weigh yourself in the morning before breakfast. Write it down and keep it in a log. Take your medicines as prescribed Eat low salt foods--Limit salt (sodium) to 2000 mg per day.  Stay as active as you can everyday Limit all fluids for the day to less than 2 liters  At the [facility], you and your health needs are our priority. As part of our continuing mission to provide you with exceptional heart care, we have created designated Provider Care Teams. These Care Teams include your primary Cardiologist (physician) and Advanced Practice Providers (APPs- Physician Assistants and Nurse Practitioners) who all work together to provide you with the care you need, when you need it.   You may see any of the following providers on your designated Care Team at your next follow up: Dr Glori Bickers Dr Loralie Champagne Dr. Roxana Hires, NP Lyda Jester, [place] [facility] [place], [place] Forestine Na, NP Audry Riles, PharmD   Please be sure to bring in all your medications bottles to every appointment.    Thank you for choosing [facility]   If you have any questions or concerns before your next appointment please send [place] a message through [place] or call our office at [phone].    TO LEAVE A MESSAGE FOR THE NURSE SELECT OPTION 2, PLEASE LEAVE A MESSAGE INCLUDING: YOUR NAME DATE OF BIRTH CALL BACK NUMBER REASON FOR CALL**this is important as we prioritize the call backs  YOU WILL  RECEIVE A CALL BACK THE SAME DAY AS LONG AS YOU CALL BEFORE 4:00 PM

## 2022-12-02 ENCOUNTER — Telehealth (HOSPITAL_COMMUNITY): Payer: Self-pay

## 2022-12-02 ENCOUNTER — Other Ambulatory Visit (HOSPITAL_COMMUNITY): Payer: Self-pay

## 2022-12-02 DIAGNOSIS — I5022 Chronic systolic (congestive) heart failure: Secondary | ICD-10-CM

## 2022-12-02 NOTE — Telephone Encounter (Signed)
Patient aware of labs and verbalized understanding of medication changes. Labs ordered and scheduled.

## 2022-12-11 DIAGNOSIS — I2510 Atherosclerotic heart disease of native coronary artery without angina pectoris: Secondary | ICD-10-CM | POA: Diagnosis not present

## 2022-12-11 DIAGNOSIS — I5022 Chronic systolic (congestive) heart failure: Secondary | ICD-10-CM | POA: Diagnosis not present

## 2022-12-15 ENCOUNTER — Ambulatory Visit (HOSPITAL_COMMUNITY)
Admission: RE | Admit: 2022-12-15 | Discharge: 2022-12-15 | Disposition: A | Discharge status: Home / Self Care | Payer: 59 | Source: Ambulatory Visit | Attending: Cardiology | Admitting: Cardiology

## 2022-12-15 DIAGNOSIS — I5022 Chronic systolic (congestive) heart failure: Secondary | ICD-10-CM | POA: Insufficient documentation

## 2022-12-15 LAB — BRAIN NATRIURETIC PEPTIDE: B Natriuretic Peptide: 121.3 pg/mL — ABNORMAL HIGH (ref 0.0–100.0)

## 2022-12-15 LAB — BASIC METABOLIC PANEL
Anion gap: 10 (ref 5–15)
BUN: 20 mg/dL (ref 8–23)
CO2: 28 mmol/L (ref 22–32)
Calcium: 9.3 mg/dL (ref 8.9–10.3)
Chloride: 102 mmol/L (ref 98–111)
Creatinine, Ser: 1.42 mg/dL — ABNORMAL HIGH (ref 0.44–1.00)
GFR, Estimated: 37 mL/min — ABNORMAL LOW (ref >60)
Glucose, Bld: 152 mg/dL — ABNORMAL HIGH (ref 70–99)
Potassium: 4.4 mmol/L (ref 3.5–5.1)
Sodium: 140 mmol/L (ref 135–145)

## 2022-12-15 NOTE — Addendum Note (Signed)
Encounter addended by: Shonna Chock, CMA on: 0000000 3:19 PM  Actions taken: Letter saved

## 2022-12-29 ENCOUNTER — Other Ambulatory Visit (HOSPITAL_COMMUNITY): Payer: Self-pay | Admitting: Internal Medicine

## 2023-01-05 ENCOUNTER — Telehealth (HOSPITAL_COMMUNITY): Payer: Self-pay | Admitting: Cardiology

## 2023-01-05 MED ORDER — FUROSEMIDE 80 MG PO TABS: 80.0000 mg | ORAL_TABLET | Freq: Every day | ORAL | 11 refills | Status: DC

## 2023-01-05 NOTE — Telephone Encounter (Signed)
Patient called check is she could decrease lasix back to previous dose of 80 mg daily Was increased to 100 mg daily with recent elevated bnp Reports since increase in dosage has no energy feels like she can't get up and go in the morning   Please advise

## 2023-01-05 NOTE — Addendum Note (Signed)
Addended by: Kerry Dory on: 01/05/2023 02:36 PM   Modules accepted: Orders

## 2023-01-05 NOTE — Telephone Encounter (Signed)
Pt aware and voiced understanding °Med list updated   °

## 2023-01-05 NOTE — Telephone Encounter (Signed)
Please call and cut back lasix to 80 mg daily.    Dana Didonato NP-C  2:31 PM

## 2023-01-13 ENCOUNTER — Other Ambulatory Visit (HOSPITAL_COMMUNITY): Payer: Self-pay | Admitting: Internal Medicine

## 2023-01-15 ENCOUNTER — Other Ambulatory Visit: Payer: Self-pay

## 2023-01-15 ENCOUNTER — Encounter: Payer: Self-pay | Admitting: Student

## 2023-01-15 ENCOUNTER — Ambulatory Visit (INDEPENDENT_AMBULATORY_CARE_PROVIDER_SITE_OTHER): Payer: 59 | Admitting: Student

## 2023-01-15 ENCOUNTER — Other Ambulatory Visit (HOSPITAL_COMMUNITY): Payer: Self-pay

## 2023-01-15 VITALS — BP 147/71 | HR 69 | Ht 62.0 in | Wt 129.2 lb

## 2023-01-15 DIAGNOSIS — E1169 Type 2 diabetes mellitus with other specified complication: Secondary | ICD-10-CM | POA: Diagnosis not present

## 2023-01-15 DIAGNOSIS — F4321 Adjustment disorder with depressed mood: Secondary | ICD-10-CM

## 2023-01-15 LAB — POCT GLYCOSYLATED HEMOGLOBIN (HGB A1C): HbA1c, POC (controlled diabetic range): 7.9 % — AB (ref 0.0–7.0)

## 2023-01-15 MED ORDER — LANTUS SOLOSTAR 100 UNIT/ML ~~LOC~~ SOPN
18.0000 [IU] | PEN_INJECTOR | Freq: Every day | SUBCUTANEOUS | 9 refills | Status: DC
Start: 2023-01-15 — End: 2023-02-12
  Filled 2023-01-15: qty 15, 83d supply, fill #0

## 2023-01-15 MED ORDER — TRULICITY 0.75 MG/0.5ML ~~LOC~~ SOAJ
0.7500 mg | SUBCUTANEOUS | 3 refills | Status: DC
Start: 2023-01-15 — End: 2023-03-18
  Filled 2023-01-15: qty 2, 28d supply, fill #0
  Filled 2023-02-12: qty 2, 28d supply, fill #1
  Filled 2023-03-16: qty 2, 28d supply, fill #2

## 2023-01-15 MED ORDER — TRAZODONE HCL 50 MG PO TABS
50.0000 mg | ORAL_TABLET | Freq: Every evening | ORAL | 0 refills | Status: DC | PRN
Start: 2023-01-15 — End: 2023-02-06

## 2023-01-15 MED ORDER — TRAZODONE HCL 50 MG PO TABS
50.0000 mg | ORAL_TABLET | Freq: Every evening | ORAL | 0 refills | Status: DC | PRN
Start: 2023-01-15 — End: 2023-01-15
  Filled 2023-01-15: qty 30, 30d supply, fill #0

## 2023-01-15 NOTE — Patient Instructions (Addendum)
It was great to see you today!   Today we addressed: Please decrease your insulin to 18 units daily, if your sugars are low (less than 85 in the morning, keep going down on your insulin at home) I am prescribing a new medicine called Trulicity, a once weekly injection. Come see me in 4-6 weeks so I can see how you are doing.  I prescribed trazodone for sleep.   You should return to our clinic Return in about 6 months (around 07/17/2023).  Please arrive 15 minutes before your appointment to ensure smooth check in process.    Please call the clinic at [phone] if your symptoms worsen or you have any concerns.  Thank you for allowing me to participate in your care, Dr. Darci Current [facility]     Therapy and Counseling Resources Most providers on this list will take Medicaid. Patients with commercial insurance or Medicare should contact their insurance company to get a list of in network providers.  [employer] (takes children) Location 1: [address], [place], [place] [address] Location 2: [place], [place] [address] [place] ([place] speaking therapist available)(habla espanol)(take medicare and medicaid)  [place], [place], [place] [address], [place] [email or web address] [phone]  [facility]:Psychiatry and Counseling [address] [place], [place] [address] [place], [place], [place] [address]      [phone]  [place] (spanish available) [place],  [address] [place] (take [facility] and medicare) [address], [place],  [address]       [phone]     [place] (virtual only) [phone]  Jinny Blossom [facility] [address], [place], [place]  [facility]:  [place]. [place] [phone]  [facility]:  [place] [address] [phone]  [facility] (under & uninsured) [address] [place] [phone]    [email or web address]    [place] [address]  [place]    [phone]  [facility] [place]     Phone:  [phone]     [place]  [place] [address]      [place], [place] ext [place]: [place], [place], [place]   [place] ([place] therapist) [email or web address]  [place] [address]   [place] [address]    [phone]    [facility]   [address],  [place], [place]   [place]  [place]  [facility]  [address] [place], [place]        [phone]  Open Access/Walk In Clinic under & uninsured  [facility]  [address] [place], [place] [facility] [phone]  [facility] [place],  ([place])   [place], [place]: [phone]) 8:30 - 12; 1 - 2:30  [facility] the [place],  [place], [place]    [phone]):8:30 - 12; 2 - 3PM  [facility] [employer],  [place],  [place]  [place]; [phone]):   Mon - Fri 8 AM - 5 PM  [facility] [place]  MWF 12:30 to 3:00 or call to schedule an appointment  [phone]  Specific Provider options Psychology Today  [email or web address] click on find a therapist  enter your zip code left side and select or tailor a therapist for your specific need.   [facility] Provider Directory [email or web address]  (Medicaid)    Follow all drop down to find a provider  [place] or [email or web address] [address], [place], [place] Recovery support and educational   24- Hour Availability:   [facility]  [address] [place], [place] Crisis [phone]  [facility] [employer] [phone]  [place]  [phone]   [place]  [phone] (after hours)  Therapeutic Alternative/Mobile Crisis   [phone]  [place] National Suicide Hotline  [phone] Diamantina Monks)  Call 911 or go to emergency room  [facility]  [phone]);  [place] and [employer]  [phone]); [place], [place], [place], [place], [place], [place], [place]

## 2023-01-15 NOTE — Assessment & Plan Note (Addendum)
Suspect insomnia related to grief and history of depression. She is not wanting to start medication at this time but is interested in seeing a therapist. Provided information on therapy resources and placed CCM referral to help coordinate care.  - refilled trazodone

## 2023-01-15 NOTE — Assessment & Plan Note (Signed)
With low fasting sugars and A1c of 7.9, will add Trulicity and decrease Lantus to 18 units daily with the goal of reducing further once she gets effect from GLP-1.  - ordered urine microalbumin/Cr ratio  - recheck A1c in 3 months  - follow up in 4-6 weeks to titrate Trulicity

## 2023-01-15 NOTE — Progress Notes (Signed)
    SUBJECTIVE:   CHIEF COMPLAINT / HPI:   Dana Coleman is a 82 y.o. female  presenting for follow up for her T2DM.   T2DM: 24 units Lantus daily in the morning, appears to be doing well no side effects.  Morning fasting: 98-124  Grief/Sleep: she reports having a lot of deaths in the family recently and has had trouble with motivation and grief. She is interested in talking to someone. She has difficulty sleeping at night and has tried melatonin. Would like a refill on trazodone.   PERTINENT  PMH / PSH: Reviewed   OBJECTIVE:   BP (!) 147/71   Pulse 69   Ht 5\' 2"  (1.575 m)   Wt 129 lb 3.2 oz (58.6 kg)   SpO2 100%   BMI 23.63 kg/m   Chronically ill-appearing, no acute distress, walks with cane  Cardio: Regular rate, regular rhythm, no murmurs on exam. Pulm: Clear, no wheezing, no crackles. No increased work of breathing Abdominal: bowel sounds present, soft, non-tender, non-distended Extremities: no peripheral edema    ASSESSMENT/PLAN:   Type 2 diabetes mellitus with other specified complication (HCC) With low fasting sugars and A1c of 7.9, will add Trulicity and decrease Lantus to 18 units daily with the goal of reducing further once she gets effect from GLP-1.  - ordered urine microalbumin/Cr ratio  - recheck A1c in 3 months  - follow up in 4-6 weeks to titrate Trulicity   Unresolved grief Suspect insomnia related to grief and history of depression. She is not wanting to start medication at this time but is interested in seeing a therapist. Provided information on therapy resources and placed CCM referral to help coordinate care.  - refilled trazodone      Darci Current, [place]

## 2023-01-16 ENCOUNTER — Encounter: Payer: Self-pay | Admitting: Student

## 2023-01-16 ENCOUNTER — Telehealth: Payer: Self-pay | Admitting: *Deleted

## 2023-01-16 LAB — MICROALBUMIN / CREATININE URINE RATIO
Creatinine, Urine: 67.9 mg/dL
Microalb/Creat Ratio: 15 mg/g creat (ref 0–29)
Microalbumin, Urine: 10.5 ug/mL

## 2023-01-16 NOTE — Progress Notes (Signed)
Routed letter to Admin team to send to patient.   Glendale Chard, DO [facility], PGY-1 01/16/23 3:49 PM

## 2023-01-16 NOTE — Progress Notes (Unsigned)
  Care Coordination  Outreach Note  01/16/2023 Name: Dana Coleman MRN: [id number] DOB: Jan 17, 1941   Care Coordination Outreach Attempts: An unsuccessful telephone outreach was attempted today to offer the patient information about available care coordination services as a benefit of their health plan.   Follow Up Plan:  Additional outreach attempts will be made to offer the patient care coordination information and services.   Encounter Outcome:  No Answer  Christie Nottingham  Care Coordination Care Guide  Direct Dial: [phone]

## 2023-01-19 NOTE — Progress Notes (Signed)
  Care Coordination   Note   01/19/2023 Name: Dana Coleman MRN: [id number] DOB: 05/11/1941  Dana Coleman is a 82 y.o. year old female who sees Glendale Chard, DO for primary care. I reached out to [name] by phone today to offer care coordination services.  Ms. Seele was given information about Care Coordination services today including:   The Care Coordination services include support from the care team which includes your Nurse Coordinator, Clinical Social Worker, or Pharmacist.  The Care Coordination team is here to help remove barriers to the health concerns and goals most important to you. Care Coordination services are voluntary, and the patient may decline or stop services at any time by request to their care team member.   Care Coordination Consent Status: Patient agreed to services and verbal consent obtained.   Follow up plan:  Telephone appointment with care coordination team member scheduled for:  01/20/23  Encounter Outcome:  Pt. Scheduled  [facility] Coordination Care Guide  Direct Dial: [phone]

## 2023-01-20 ENCOUNTER — Ambulatory Visit: Payer: Self-pay | Admitting: *Deleted

## 2023-01-20 NOTE — Patient Outreach (Addendum)
  Care Coordination   Initial Visit Note   01/20/2023 Name: Dana Coleman MRN: [id number] DOB: [birth year]  Dana Coleman is a 82 y.o. year old female who sees Glendale Chard, DO for primary care. I spoke with  [name] by phone today.  What matters to the patients health and wellness today?  "I want to sleep better"    Goals Addressed             This Visit's Progress    Provide support, resources and guidance in reducing symptoms related to grief/loss/depression       Activities and task to complete in order to accomplish goals.   Review and consider Grief Counseling as discussed today- will mail you info on free services offered by [facility] and other stimulating activities before bed Continue to take the medicine prescribed by PCP last week to help with sleep- glad to hear it is helping! Start / continue relaxed breathing 3 times daily Keep all upcoming appointment discussed today Continue with compliance of taking medication prescribed by Doctor Self Support options  (find ways to relax, calm your body and mind prior to going to bed to help with getting to sleep Review your EMMI educational information- Look for an e-mail from [facility].         SDOH assessments and interventions completed:  Yes  SDOH Interventions Today    Flowsheet Row Most Recent Value  SDOH Interventions   Food Insecurity Interventions Intervention Not Indicated  Housing Interventions Intervention Not Indicated  Transportation Interventions Intervention Not Indicated  Stress Interventions Provide Counseling, Other (Comment)  [pt reports having trouble with sleep- with loss of 2 daughters and a sister, PCP added RXlast week which is helping with sleep]        Care Coordination Interventions:  Yes, provided  Interventions Today    Flowsheet Row Most Recent Value  Chronic Disease   Chronic disease during today's visit Diabetes  General  Interventions   General Interventions Discussed/Reviewed General Interventions Discussed, General Interventions Reviewed, Community Resources  Mental Health Interventions   Mental Health Discussed/Reviewed Mental Health Discussed, Mental Health Reviewed, Coping Strategies, Depression, Grief and Loss  [Pt has last 2 daughters in the recent past (2023) as well as a sister who she was very close to]  Pharmacy Interventions   Pharmacy Dicussed/Reviewed Medication Adherence  Safety Interventions   Safety Discussed/Reviewed Safety Discussed       Follow up plan: Follow up call scheduled for 02/13/23    Encounter Outcome:  Pt. Visit Completed

## 2023-01-20 NOTE — Patient Instructions (Signed)
Visit Information  Thank you for taking time to visit with me today. Please don't hesitate to contact me if I can be of assistance to you.   Following are the goals we discussed today:   Goals Addressed             This Visit's Progress    Provide support, resources and guidance in reducing symptoms related to grief/loss/depression       Activities and task to complete in order to accomplish goals.   Review and consider Grief Counseling as discussed today- will mail you info on free services offered by [facility] and other stimulating activities before bed Continue to take the medicine prescribed by PCP last week to help with sleep- glad to hear it is helping! Start / continue relaxed breathing 3 times daily Keep all upcoming appointment discussed today Continue with compliance of taking medication prescribed by Doctor Self Support options  (find ways to relax, calm your body and mind prior to going to bed to help with getting to sleep Review your EMMI educational information- Look for an e-mail from [facility].         Our next appointment is by telephone on 02/13/23   Please call the care guide team at [phone] if you need to cancel or reschedule your appointment.   If you are experiencing a Mental Health or Behavioral Health Crisis or need someone to talk to, please call the Suicide and Crisis Lifeline: 988 call 911   The patient verbalized understanding of instructions, educational materials, and care plan provided today and DECLINED offer to receive copy of patient instructions, educational materials, and care plan.   Telephone follow up appointment with care management team member scheduled for:02/13/23  Reece Levy, MSW, LCSW Clinical Social Worker [facility] [employer] [phone]

## 2023-01-21 ENCOUNTER — Encounter: Payer: Self-pay | Admitting: Gastroenterology

## 2023-01-27 ENCOUNTER — Other Ambulatory Visit: Payer: Self-pay | Admitting: Pharmacist

## 2023-01-27 NOTE — Progress Notes (Signed)
Patient outreached by Michiel Cowboy, PharmD Candidate on 01/27/2023 to discuss hypertension.   Patient does not have an automated home blood pressure machine.   Medication review was performed. They are taking medications as prescribed.   The following barriers to adherence were noted:  - They do not have cost concerns.  - They do not have transportation concerns.  - They do not need assistance obtaining refills.  - They do not occasionally forget to take some of their prescribed medications.  - They do not feel like one/some of their medications make them feel poorly.  - They do not have questions or concerns about their medications.  - They do not have follow up scheduled with their primary care provider/cardiologist.   The following interventions were completed:  - Medications were reviewed  - Patient was educated on goal blood pressures and long term health implications of elevated blood pressure  - Patient was educated on how to access home blood pressure machine  - Patient was counseled on lifestyle modifications to improve blood pressure, including dietary improvements (limiting salt, caffeine, processed foods) and increased physical activity.   The patient does not have follow up scheduled:  - Last note from PCP calls for 4-6 week follow-up. However, not scheduled at this time. Advised patient to call PCP's office to ensure visit gets scheduled.    Michiel Cowboy, PharmD Candidate   Catie Eppie Gibson, PharmD, BCACP, CPP [facility] [phone]

## 2023-02-06 ENCOUNTER — Other Ambulatory Visit: Payer: Self-pay | Admitting: Student

## 2023-02-06 DIAGNOSIS — E1169 Type 2 diabetes mellitus with other specified complication: Secondary | ICD-10-CM

## 2023-02-12 ENCOUNTER — Other Ambulatory Visit (HOSPITAL_COMMUNITY): Payer: Self-pay

## 2023-02-12 ENCOUNTER — Other Ambulatory Visit: Payer: Self-pay | Admitting: Student

## 2023-02-12 ENCOUNTER — Ambulatory Visit (INDEPENDENT_AMBULATORY_CARE_PROVIDER_SITE_OTHER): Payer: 59 | Admitting: Student

## 2023-02-12 ENCOUNTER — Encounter: Payer: Self-pay | Admitting: Student

## 2023-02-12 DIAGNOSIS — E1169 Type 2 diabetes mellitus with other specified complication: Secondary | ICD-10-CM

## 2023-02-12 MED ORDER — LANTUS SOLOSTAR 100 UNIT/ML ~~LOC~~ SOPN
10.0000 [IU] | PEN_INJECTOR | Freq: Every day | SUBCUTANEOUS | 9 refills | Status: DC
Start: 2023-02-12 — End: 2023-02-13

## 2023-02-12 NOTE — Patient Instructions (Addendum)
It was great to see you today!   Diabetes:  Decrease Insulin to 10 units daily  Continue taking Trulicity 0.75 mg weekly, when you run out of the medication I will send in a refill for a higher dose.    You have an appointment scheduled with me on June 10th at 2:15 PM  Please arrive 15 minutes before your appointment to ensure smooth check in process.    Please call the clinic at [phone] if your symptoms worsen or you have any concerns.  Thank you for allowing me to participate in your care, Dr. Glendale Chard [facility]

## 2023-02-12 NOTE — Progress Notes (Signed)
    SUBJECTIVE:   CHIEF COMPLAINT / HPI:   Dana Coleman is a 82 y.o. female  presenting for f/u of T2DM. At last visit on 01/15/23 she was started on Trulicity 0.75 mg weekly and her insulin was decreased to 18 units daily.   Fasting morning sugars high 90s-low 100s. She has a nurse that comes to her home to help check her sugars.  PERTINENT  PMH / PSH: Reviewed   OBJECTIVE:   BP 129/75   Pulse 89   Ht 5\' 2"  (1.575 m)   Wt 127 lb 6.4 oz (57.8 kg)   SpO2 98%   BMI 23.30 kg/m   Well-appearing, no acute distress Cardio: Regular rate, regular rhythm, no murmurs on exam. Pulm: Clear, no wheezing, no crackles. No increased work of breathing Abdominal: bowel sounds present, soft, non-tender, non-distended Extremities: no peripheral edema    ASSESSMENT/PLAN:   Type 2 diabetes mellitus with other specified complication (HCC) With low fasting sugars will decrease insulin to 10 units daily and continue Trulicity at 0.75 mg weekly. Patient just picked up another 4 week refill, will increase Trulicity dose to 1.5 mg at next refill.  - f/u June 10th for sugar recheck and A1c      Glendale Chard, DO [facility]

## 2023-02-12 NOTE — Assessment & Plan Note (Signed)
With low fasting sugars will decrease insulin to 10 units daily and continue Trulicity at 0.75 mg weekly. Patient just picked up another 4 week refill, will increase Trulicity dose to 1.5 mg at next refill.  - f/u June 10th for sugar recheck and A1c

## 2023-02-13 ENCOUNTER — Ambulatory Visit: Payer: Self-pay | Admitting: *Deleted

## 2023-02-13 ENCOUNTER — Other Ambulatory Visit: Payer: Self-pay | Admitting: Student

## 2023-02-13 DIAGNOSIS — E1169 Type 2 diabetes mellitus with other specified complication: Secondary | ICD-10-CM

## 2023-02-13 NOTE — Patient Instructions (Signed)
Visit Information  Thank you for taking time to visit with me today. Please don't hesitate to contact me if I can be of assistance to you.   Following are the goals we discussed today:   Goals Addressed             This Visit's Progress    Provide support, resources and guidance in reducing symptoms related to grief/loss/depression       Activities and task to complete in order to accomplish goals.   Review and consider Grief Counseling as discussed today- review material sent by mail to you on free services offered by [facility] and other stimulating activities before bed Continue to take the medicine prescribed by PCP to help with sleep- glad to hear it is helping! Start / continue relaxed breathing 3 times daily Keep all upcoming appointment discussed today Continue with compliance of taking medication prescribed by Doctor Self Support options  (find ways to relax, calm your body and mind prior to going to bed to help with getting to sleep Review your EMMI educational information- Look for an e-mail from [facility]. Allow yourself to remember, grieve and honor your mother and yourself on Mother's Day         Our next appointment is by telephone on 03/16/23  Please call the care guide team at [phone] if you need to cancel or reschedule your appointment.   If you are experiencing a Mental Health or Behavioral Health Crisis or need someone to talk to, please call the Suicide and Crisis Lifeline: 988 call 911   The patient verbalized understanding of instructions, educational materials, and care plan provided today and DECLINED offer to receive copy of patient instructions, educational materials, and care plan.   Telephone follow up appointment with care management team member scheduled for:03/16/23  Reece Levy, MSW, LCSW Clinical Social Worker [facility] [employer] [phone]

## 2023-02-13 NOTE — Patient Outreach (Signed)
  Care Coordination   Follow Up Visit Note   02/13/2023 Name: Dana Coleman MRN: [id number] DOB: 01/04/1941  Dana Coleman is a 82 y.o. year old female who sees Glendale Chard, DO for primary care. I spoke with  [name] by phone today.  What matters to the patients health and wellness today?  Sleeping much better which leads to overall feeling better.     Goals Addressed             This Visit's Progress    Provide support, resources and guidance in reducing symptoms related to grief/loss/depression       Activities and task to complete in order to accomplish goals.   Review and consider Grief Counseling as discussed today- review material sent by mail to you on free services offered by [facility] and other stimulating activities before bed Continue to take the medicine prescribed by PCP to help with sleep- glad to hear it is helping! Start / continue relaxed breathing 3 times daily Keep all upcoming appointment discussed today Continue with compliance of taking medication prescribed by Doctor Self Support options  (find ways to relax, calm your body and mind prior to going to bed to help with getting to sleep Review your EMMI educational information- Look for an e-mail from [facility]. Allow yourself to remember, grieve and honor your mother and yourself on Mother's Day         SDOH assessments and interventions completed:  Yes     Care Coordination Interventions:  Yes, provided  Interventions Today    Flowsheet Row Most Recent Value  Chronic Disease   Chronic disease during today's visit Other  [grief/loss]  General Interventions   General Interventions Discussed/Reviewed [employer] received info about grief counseling and support- will review]  Mental Health Interventions   Mental Health Discussed/Reviewed Mental Health Discussed, Mental Health Reviewed, Coping Strategies, Grief and Loss, Other  [Pt  reports sleeping much better since getting RX Trazadone]       Follow up plan: Follow up call scheduled for 03/16/23    Encounter Outcome:  Pt. Visit Completed

## 2023-03-16 ENCOUNTER — Other Ambulatory Visit: Payer: Self-pay | Admitting: Student

## 2023-03-16 ENCOUNTER — Other Ambulatory Visit (HOSPITAL_COMMUNITY): Payer: Self-pay | Admitting: Internal Medicine

## 2023-03-16 ENCOUNTER — Ambulatory Visit: Payer: Self-pay | Admitting: *Deleted

## 2023-03-16 NOTE — Patient Outreach (Signed)
  Care Coordination   Follow Up Visit Note   03/16/2023 Name: Dana Coleman MRN: [id number] DOB: July 25, 1941  Dana Coleman is a 82 y.o. year old female who sees Glendale Chard, DO for primary care. I spoke with  [name] by phone today.  What matters to the patients health and wellness today?  Pt reports doing better- celebrated her upcoming birthday this past weekend.    Goals Addressed             This Visit's Progress    Provide support, resources and guidance in reducing symptoms related to grief/loss/depression       Activities and task to complete in order to accomplish goals.   Review and consider Grief Counseling material mailed to you- consider participation if needs arise through this free service offered by [facility] and other stimulating activities before bed Continue to take the medicine prescribed by PCP to help with sleep- glad to hear it is helping! Start / continue relaxed breathing 3 times daily Keep all upcoming appointment discussed today Continue with compliance of taking medication prescribed by Doctor Self Support options  (find ways to relax, calm your body and mind prior to going to bed to help with getting to sleep Review your EMMI educational information- Look for an e-mail from [facility]. Allow yourself to remember, grieve and honor your mother and yourself on Mother's Day         SDOH assessments and interventions completed:  Yes     Care Coordination Interventions:  Yes, provided  Interventions Today    Flowsheet Row Most Recent Value  Chronic Disease   Chronic disease during today's visit Other  [grief/loss with depression]  General Interventions   General Interventions Discussed/Reviewed [employer] indicates she has received the material mailed to her regarding [facility] bereavement programs- she feels she is managing ok right now and not interested but will consider in  the future]  Mental Health Interventions   Mental Health Discussed/Reviewed Mental Health Discussed, Coping Strategies, Depression, Grief and Loss, Other  [Pt reports the Trazadone that was prescribed is continuing to work well and she reports "doing better"]       Follow up plan: Follow up call scheduled for 04/14/23    Encounter Outcome:  Pt. Visit Completed

## 2023-03-16 NOTE — Patient Instructions (Addendum)
Visit Information  Thank you for taking time to visit with me today. Please don't hesitate to contact me if I can be of assistance to you.   HAPPY BIRTHDAY!  Following are the goals we discussed today:   Goals Addressed             This Visit's Progress    Provide support, resources and guidance in reducing symptoms related to grief/loss/depression       Activities and task to complete in order to accomplish goals.   Review and consider Grief Counseling material mailed to you- consider participation if needs arise through this free service offered by [employer] and other stimulating activities before bed Continue to take the medicine prescribed by PCP to help with sleep- glad to hear it is helping! Start / continue relaxed breathing 3 times daily Keep all upcoming appointment discussed today Continue with compliance of taking medication prescribed by Doctor Self Support options  (find ways to relax, calm your body and mind prior to going to bed to help with getting to sleep Review your EMMI educational information- Look for an e-mail from [facility]. Allow yourself to remember, grieve and honor your mother and yourself on Mother's Day         Our next appointment is by telephone on 04/14/23  Please call the care guide team at [phone] if you need to cancel or reschedule your appointment.   If you are experiencing a Mental Health or Behavioral Health Crisis or need someone to talk to, please call the Suicide and Crisis Lifeline: 988 call 911   The patient verbalized understanding of instructions, educational materials, and care plan provided today and DECLINED offer to receive copy of patient instructions, educational materials, and care plan.   Telephone follow up appointment with care management team member scheduled for:04/14/23 Reece Levy, MSW, LCSW Clinical Social Worker [facility] [phone]

## 2023-03-17 ENCOUNTER — Other Ambulatory Visit (HOSPITAL_COMMUNITY): Payer: Self-pay

## 2023-03-18 ENCOUNTER — Telehealth: Payer: Self-pay | Admitting: *Deleted

## 2023-03-18 DIAGNOSIS — E1169 Type 2 diabetes mellitus with other specified complication: Secondary | ICD-10-CM

## 2023-03-18 MED ORDER — TRULICITY 0.75 MG/0.5ML ~~LOC~~ SOAJ: 1.5000 mg | SUBCUTANEOUS | 3 refills | Status: DC

## 2023-03-18 NOTE — Telephone Encounter (Signed)
Patient called stating that she is needing the next dose of her trulicity sent to the pharmacy.  Will forward to MD.  Burnard Hawthorne

## 2023-03-23 ENCOUNTER — Encounter: Payer: Self-pay | Admitting: Student

## 2023-03-23 ENCOUNTER — Ambulatory Visit (INDEPENDENT_AMBULATORY_CARE_PROVIDER_SITE_OTHER): Payer: 59 | Admitting: Student

## 2023-03-23 ENCOUNTER — Other Ambulatory Visit (HOSPITAL_COMMUNITY): Payer: Self-pay

## 2023-03-23 VITALS — BP 132/71 | HR 67 | Ht 62.0 in | Wt 129.2 lb

## 2023-03-23 DIAGNOSIS — N1831 Chronic kidney disease, stage 3a: Secondary | ICD-10-CM | POA: Diagnosis not present

## 2023-03-23 DIAGNOSIS — E1169 Type 2 diabetes mellitus with other specified complication: Secondary | ICD-10-CM

## 2023-03-23 MED ORDER — TRULICITY 0.75 MG/0.5ML ~~LOC~~ SOAJ: 1.5000 mg | SUBCUTANEOUS | 3 refills | Status: DC

## 2023-03-23 MED ORDER — TRULICITY 0.75 MG/0.5ML ~~LOC~~ SOAJ
1.5000 mg | SUBCUTANEOUS | 3 refills | Status: DC
Start: 2023-03-23 — End: 2023-03-23
  Filled 2023-03-23: qty 2, 14d supply, fill #0

## 2023-03-23 NOTE — Progress Notes (Signed)
    SUBJECTIVE:   CHIEF COMPLAINT / HPI:   Dana Coleman is a 82 y.o. female  presenting for diabetes follow up.   T2DM:  Last A1c: 7.9, two months ago Currently on Trulicity 0.75 mg weekly and Insulin 10 units daily.  Morning fasting blood sugar is <120.   CKD3: would benefit from SGLT2i, however patient had tried the medicine in the past and had severe UTIs.   PERTINENT  PMH / PSH: Reviewed and updated   OBJECTIVE:   BP 132/71   Pulse 67   Ht 5\' 2"  (1.575 m)   Wt 129 lb 3.2 oz (58.6 kg)   SpO2 98%   BMI 23.63 kg/m   Well-appearing, no acute distress Cardio: Regular rate, regular rhythm, no murmurs on exam. Pulm: Clear, no wheezing, no crackles. No increased work of breathing Abdominal: bowel sounds present, soft, non-tender, non-distended Extremities: no peripheral edema  Neuro: alert and oriented x3, speech normal in content, no facial asymmetry, strength intact and equal bilaterally in UE and LE, pupils equal and reactive to light.  Psych:  Cognition and judgment appear intact. Alert, communicative  and cooperative with normal attention span and concentration. No apparent delusions, illusions, hallucinations      02/12/2023   10:50 AM 01/20/2023    2:17 PM 01/15/2023    1:55 PM  PHQ9 SCORE ONLY  PHQ-9 Total Score 3 5 5       ASSESSMENT/PLAN:   Type 2 diabetes mellitus with other specified complication (HCC) Up Trulicity to 1.5 mg weekly, cautioned patient about GI side effects.  Will continue current dose of insulin at 10 units, instructed patient to call the office if her morning fasting sugars are <100 and we will adjust her insulin.  Due for A1c at next visit  Stage 3a chronic kidney disease (CKD) (HCC) Discussed SGLT2 but patient has a known intolerance. Will recheck BMP with PTH and Phos.      Glendale Chard, DO [facility]

## 2023-03-23 NOTE — Assessment & Plan Note (Addendum)
Up Trulicity to 1.5 mg weekly, cautioned patient about GI side effects.  Will continue current dose of insulin at 10 units, instructed patient to call the office if her morning fasting sugars are <100 and we will adjust her insulin.  Due for A1c at next visit

## 2023-03-23 NOTE — Patient Instructions (Signed)
It was great to see you today!   Today we addressed: Diabetes: I sent in your prescription to [facility]. Start taking 1.5 mg weekly when you get the new dose.  If your morning sugars get below 100, call the office and let me know.   Future Appointments  Date Time Provider Department Center  03/23/2023  2:15 PM Glendale Chard, DO [facility]  04/14/2023  9:30 AM Buck Mam, LCSW [facility] None  05/19/2023 10:10 AM Mansouraty, Netty Starring., MD [facility]    Please arrive 15 minutes before your appointment to ensure smooth check in process.    Please call the clinic at [phone] if your symptoms worsen or you have any concerns.  Thank you for allowing me to participate in your care, Dr. Glendale Chard [facility]

## 2023-03-23 NOTE — Assessment & Plan Note (Signed)
Discussed SGLT2 but patient has a known intolerance. Will recheck BMP with PTH and Phos.

## 2023-03-24 ENCOUNTER — Telehealth: Payer: Self-pay | Admitting: Student

## 2023-03-24 LAB — BASIC METABOLIC PANEL
BUN/Creatinine Ratio: 15 (ref 12–28)
BUN: 15 mg/dL (ref 8–27)
CO2: 26 mmol/L (ref 20–29)
Calcium: 9.3 mg/dL (ref 8.7–10.3)
Chloride: 102 mmol/L (ref 96–106)
Creatinine, Ser: 1.02 mg/dL — ABNORMAL HIGH (ref 0.57–1.00)
Glucose: 102 mg/dL — ABNORMAL HIGH (ref 70–99)
Potassium: 4.7 mmol/L (ref 3.5–5.2)
Sodium: 141 mmol/L (ref 134–144)
eGFR: 55 mL/min/{1.73_m2} — ABNORMAL LOW (ref >59)

## 2023-03-24 LAB — PARATHYROID HORMONE, INTACT (NO CA): PTH: 27 pg/mL (ref 15–65)

## 2023-03-24 LAB — PHOSPHORUS: Phosphorus: 3 mg/dL (ref 3.0–4.3)

## 2023-03-24 NOTE — Telephone Encounter (Signed)
Called and discussed lab results with patient. She is appreciative of care. Will need to recheck BMP every 3-6 months to monitor CKD3a.   Glendale Chard, DO [facility], PGY-1 03/24/23 4:48 PM

## 2023-03-27 ENCOUNTER — Other Ambulatory Visit (HOSPITAL_COMMUNITY): Payer: Self-pay | Admitting: Internal Medicine

## 2023-04-03 DIAGNOSIS — I25118 Atherosclerotic heart disease of native coronary artery with other forms of angina pectoris: Secondary | ICD-10-CM | POA: Diagnosis not present

## 2023-04-03 DIAGNOSIS — I420 Dilated cardiomyopathy: Secondary | ICD-10-CM | POA: Diagnosis not present

## 2023-04-03 DIAGNOSIS — I5022 Chronic systolic (congestive) heart failure: Secondary | ICD-10-CM | POA: Diagnosis not present

## 2023-04-03 DIAGNOSIS — I2699 Other pulmonary embolism without acute cor pulmonale: Secondary | ICD-10-CM | POA: Diagnosis not present

## 2023-04-14 ENCOUNTER — Other Ambulatory Visit (HOSPITAL_COMMUNITY): Payer: Self-pay | Admitting: Internal Medicine

## 2023-04-14 ENCOUNTER — Other Ambulatory Visit: Payer: Self-pay

## 2023-04-14 ENCOUNTER — Ambulatory Visit: Payer: Self-pay | Admitting: *Deleted

## 2023-04-14 ENCOUNTER — Other Ambulatory Visit: Payer: Self-pay | Admitting: Student

## 2023-04-14 DIAGNOSIS — E785 Hyperlipidemia, unspecified: Secondary | ICD-10-CM

## 2023-04-14 MED ORDER — ONETOUCH ULTRA BLUE VI STRP: ORAL_STRIP | 1 refills | Status: DC

## 2023-04-14 NOTE — Patient Outreach (Signed)
  Care Coordination   Follow Up Visit Note   04/14/2023 Name: CADEN LEVERONE MRN: [id number] DOB: May 24, 1941  Josue Sanfratello Behanna is a 82 y.o. year old female who sees Glendale Chard, DO for primary care. I spoke with  [name] by phone today.  What matters to the patients health and wellness today?  Patient reports "doing fine" and looking forward to beach trip with family.     Goals Addressed             This Visit's Progress    COMPLETED: Provide support, resources and guidance in reducing symptoms related to grief/loss/depression       Activities and task to complete in order to accomplish goals.   Review and consider Grief Counseling material mailed to you- consider participation if needs arise through this free service offered by [employer] and other stimulating activities before bed Continue to take the medicine prescribed by PCP to help with sleep- glad to hear it is helping! Start / continue relaxed breathing 3 times daily Keep all upcoming appointment discussed today Continue with compliance of taking medication prescribed by Doctor Self Support options  (find ways to relax, calm your body and mind prior to going to bed to help with getting to sleep Review your EMMI educational information- Look for an e-mail from [facility]. Allow yourself to remember, grieve and honor your mother and yourself on Mother's Day         SDOH assessments and interventions completed:  Yes     Care Coordination Interventions:  Yes, provided  Interventions Today    Flowsheet Row Most Recent Value  Mental Health Interventions   Mental Health Discussed/Reviewed Coping Strategies, Grief and Loss, Depression  [Pt denies depression, PHQ2 score =0. Declines need for support/interventions. Does have grief counseling info provided]       Follow up plan: No further intervention required.   Encounter Outcome:  Pt. Visit Completed

## 2023-04-14 NOTE — Patient Instructions (Signed)
Visit Information  Thank you for taking time to visit with me today. Please don't hesitate to contact me if I can be of assistance to you.   Following are the goals we discussed today:   Goals Addressed             This Visit's Progress    COMPLETED: Provide support, resources and guidance in reducing symptoms related to grief/loss/depression       Activities and task to complete in order to accomplish goals.   Review and consider Grief Counseling material mailed to you- consider participation if needs arise through this free service offered by [employer] and other stimulating activities before bed Continue to take the medicine prescribed by PCP to help with sleep- glad to hear it is helping! Start / continue relaxed breathing 3 times daily Keep all upcoming appointment discussed today Continue with compliance of taking medication prescribed by Doctor Self Support options  (find ways to relax, calm your body and mind prior to going to bed to help with getting to sleep Review your EMMI educational information- Look for an e-mail from [facility]. Allow yourself to remember, grieve and honor your mother and yourself on Mother's Day         If you are experiencing a Mental Health or Behavioral Health Crisis or need someone to talk to, please call the Suicide and Crisis Lifeline: 988 call 911   The patient verbalized understanding of instructions, educational materials, and care plan provided today and DECLINED offer to receive copy of patient instructions, educational materials, and care plan.   No further follow up required:    Reece Levy, MSW, LCSW Clinical Social Worker [facility] [phone]

## 2023-04-16 NOTE — Patient Instructions (Addendum)
Dana Coleman , Thank you for taking time to come for your Medicare Wellness Visit. I appreciate your ongoing commitment to your health goals. Please review the following plan we discussed and let me know if I can assist you in the future.   These are the goals we discussed:  Goals      HEMOGLOBIN A1C < 7     07/24/2021-6.7     Increase water intake     Infuse with fruit or vegetable     Remain active and independent     Weight < 135 lb (61.236 kg)     Goal Weigh of 135 lbs         This is a list of the screening recommended for you and due dates:  Health Maintenance  Topic Date Due   COVID-19 Vaccine (5 - 2023-24 season) 05/03/2023*   Zoster (Shingles) Vaccine (1 of 2) 07/18/2023*   Flu Shot  05/14/2023   Eye exam for diabetics  05/31/2023   Hemoglobin A1C  07/17/2023   Yearly kidney health urinalysis for diabetes  01/15/2024   Complete foot exam   02/12/2024   Yearly kidney function blood test for diabetes  03/22/2024   Medicare Annual Wellness Visit  04/16/2024   DTaP/Tdap/Td vaccine (3 - Td or Tdap) 11/18/2027   Pneumonia Vaccine  Completed   DEXA scan (bone density measurement)  Completed   HPV Vaccine  Aged Out   Colon Cancer Screening  Discontinued   Hepatitis C Screening  Discontinued  *Topic was postponed. The date shown is not the original due date.    Advanced directives: Information on Advanced Care Planning can be found at [facility] Advance Health Care Directives Advance Health Care Directives ([email or web address] Please bring a copy of your health care power of attorney and living will to the office to be added to your chart at your convenience.   Conditions/risks identified: Aim for 30 minutes of exercise or brisk walking, 6-8 glasses of water, and 5 servings of fruits and vegetables each day.  Next appointment: Follow up in one year for your annual wellness visit    Preventive Care 65 Years and Older, Female Preventive care refers to  lifestyle choices and visits with your health care provider that can promote health and wellness. What does preventive care include? A yearly physical exam. This is also called an annual well check. Dental exams once or twice a year. Routine eye exams. Ask your health care provider how often you should have your eyes checked. Personal lifestyle choices, including: Daily care of your teeth and gums. Regular physical activity. Eating a healthy diet. Avoiding tobacco and drug use. Limiting alcohol use. Practicing safe sex. Taking low-dose aspirin every day. Taking vitamin and mineral supplements as recommended by your health care provider. What happens during an annual well check? The services and screenings done by your health care provider during your annual well check will depend on your age, overall health, lifestyle risk factors, and family history of disease. Counseling  Your health care provider may ask you questions about your: Alcohol use. Tobacco use. Drug use. Emotional well-being. Home and relationship well-being. Sexual activity. Eating habits. History of falls. Memory and ability to understand (cognition). Work and work Astronomer. Reproductive health. Screening  You may have the following tests or measurements: Height, weight, and BMI. Blood pressure. Lipid and cholesterol levels. These may be checked every 5 years, or more frequently if you are over 65 years old. Skin check.  Lung cancer screening. You may have this screening every year starting at age 106 if you have a 30-pack-year history of smoking and currently smoke or have quit within the past 15 years. Fecal occult blood test (FOBT) of the stool. You may have this test every year starting at age 69. Flexible sigmoidoscopy or colonoscopy. You may have a sigmoidoscopy every 5 years or a colonoscopy every 10 years starting at age 25. Hepatitis C blood test. Hepatitis B blood test. Sexually transmitted disease  (STD) testing. Diabetes screening. This is done by checking your blood sugar (glucose) after you have not eaten for a while (fasting). You may have this done every 1-3 years. Bone density scan. This is done to screen for osteoporosis. You may have this done starting at age 48. Mammogram. This may be done every 1-2 years. Talk to your health care provider about how often you should have regular mammograms. Talk with your health care provider about your test results, treatment options, and if necessary, the need for more tests. Vaccines  Your health care provider may recommend certain vaccines, such as: Influenza vaccine. This is recommended every year. Tetanus, diphtheria, and acellular pertussis (Tdap, Td) vaccine. You may need a Td booster every 10 years. Zoster vaccine. You may need this after age 13. Pneumococcal 13-valent conjugate (PCV13) vaccine. One dose is recommended after age 67. Pneumococcal polysaccharide (PPSV23) vaccine. One dose is recommended after age 67. Talk to your health care provider about which screenings and vaccines you need and how often you need them. This information is not intended to replace advice given to you by your health care provider. Make sure you discuss any questions you have with your health care provider. Document Released: 10/26/2015 Document Revised: 06/18/2016 Document Reviewed: 07/31/2015 Elsevier Interactive Patient Education  2017 [employer].  Fall Prevention in the Home Falls can cause injuries. They can happen to people of all ages. There are many things you can do to make your home safe and to help prevent falls. What can I do on the outside of my home? Regularly fix the edges of walkways and driveways and fix any cracks. Remove anything that might make you trip as you walk through a door, such as a raised step or threshold. Trim any bushes or trees on the path to your home. Use bright outdoor lighting. Clear any walking paths of anything  that might make someone trip, such as rocks or tools. Regularly check to see if handrails are loose or broken. Make sure that both sides of any steps have handrails. Any raised decks and porches should have guardrails on the edges. Have any leaves, snow, or ice cleared regularly. Use sand or salt on walking paths during winter. Clean up any spills in your garage right away. This includes oil or grease spills. What can I do in the bathroom? Use night lights. Install grab bars by the toilet and in the tub and shower. Do not use towel bars as grab bars. Use non-skid mats or decals in the tub or shower. If you need to sit down in the shower, use a plastic, non-slip stool. Keep the floor dry. Clean up any water that spills on the floor as soon as it happens. Remove soap buildup in the tub or shower regularly. Attach bath mats securely with double-sided non-slip rug tape. Do not have throw rugs and other things on the floor that can make you trip. What can I do in the bedroom? Use night lights. Make sure  that you have a light by your bed that is easy to reach. Do not use any sheets or blankets that are too big for your bed. They should not hang down onto the floor. Have a firm chair that has side arms. You can use this for support while you get dressed. Do not have throw rugs and other things on the floor that can make you trip. What can I do in the kitchen? Clean up any spills right away. Avoid walking on wet floors. Keep items that you use a lot in easy-to-reach places. If you need to reach something above you, use a strong step stool that has a grab bar. Keep electrical cords out of the way. Do not use floor polish or wax that makes floors slippery. If you must use wax, use non-skid floor wax. Do not have throw rugs and other things on the floor that can make you trip. What can I do with my stairs? Do not leave any items on the stairs. Make sure that there are handrails on both sides of  the stairs and use them. Fix handrails that are broken or loose. Make sure that handrails are as long as the stairways. Check any carpeting to make sure that it is firmly attached to the stairs. Fix any carpet that is loose or worn. Avoid having throw rugs at the top or bottom of the stairs. If you do have throw rugs, attach them to the floor with carpet tape. Make sure that you have a light switch at the top of the stairs and the bottom of the stairs. If you do not have them, ask someone to add them for you. What else can I do to help prevent falls? Wear shoes that: Do not have high heels. Have rubber bottoms. Are comfortable and fit you well. Are closed at the toe. Do not wear sandals. If you use a stepladder: Make sure that it is fully opened. Do not climb a closed stepladder. Make sure that both sides of the stepladder are locked into place. Ask someone to hold it for you, if possible. Clearly mark and make sure that you can see: Any grab bars or handrails. First and last steps. Where the edge of each step is. Use tools that help you move around (mobility aids) if they are needed. These include: Canes. Walkers. Scooters. Crutches. Turn on the lights when you go into a dark area. Replace any light bulbs as soon as they burn out. Set up your furniture so you have a clear path. Avoid moving your furniture around. If any of your floors are uneven, fix them. If there are any pets around you, be aware of where they are. Review your medicines with your doctor. Some medicines can make you feel dizzy. This can increase your chance of falling. Ask your doctor what other things that you can do to help prevent falls. This information is not intended to replace advice given to you by your health care provider. Make sure you discuss any questions you have with your health care provider. Document Released: 07/26/2009 Document Revised: 03/06/2016 Document Reviewed: 11/03/2014 Elsevier Interactive  Patient Education  2017 [employer].

## 2023-04-16 NOTE — Progress Notes (Signed)
Subjective:   Dana Coleman is a 82 y.o. female who presents for Medicare Annual (Subsequent) preventive examination.  Visit Complete: Virtual  I connected with  [name] on 04/17/23 by a audio enabled telemedicine application and verified that I am speaking with the correct person using two identifiers.  Patient Location: Home  Provider Location: Home Office  I discussed the limitations of evaluation and management by telemedicine. The patient expressed understanding and agreed to proceed.  Review of Systems     Cardiac Risk Factors include: advanced age (>23men, >76 women);diabetes mellitus;dyslipidemia;hypertension     Objective:    Today's Vitals   04/17/23 1202  Weight: 129 lb (58.5 kg)  Height: 5\' 2"  (1.575 m)   Body mass index is 23.59 kg/m.     04/17/2023   12:07 PM 03/23/2023    1:48 PM 02/12/2023   10:39 AM 01/15/2023    1:55 PM 09/09/2022    2:03 PM 07/03/2022    1:50 PM 03/23/2022    2:15 PM  Advanced Directives  Does Patient Have a Medical Advance Directive? No No No No No No No  Would patient like information on creating a medical advance directive? Yes (MAU/Ambulatory/Procedural Areas - Information given) No - Patient declined No - Patient declined No - Patient declined No - Patient declined No - Patient declined No - Patient declined    Current Medications (verified) Outpatient Encounter Medications as of 04/17/2023  Medication Sig   acetaminophen (TYLENOL) 500 MG tablet Take 500 mg by mouth every 6 (six) hours as needed for moderate pain.   apixaban (ELIQUIS) 2.5 MG TABS tablet Take 1 tablet (2.5 mg total) by mouth 2 (two) times daily.   ascorbic acid (VITAMIN C) 500 MG tablet Take 500 mg by mouth daily.   aspirin EC 81 MG tablet Take 81 mg by mouth in the morning. Swallow whole.   Blood Glucose Monitoring Suppl MISC daily.   Dulaglutide (TRULICITY) 0.75 MG/0.5ML SOPN Inject 1.5 mg into the skin once a week.   ENTRESTO 24-26 MG TAKE 1 TABLET  BY MOUTH TWICE A DAY   fluticasone (FLONASE) 50 MCG/ACT nasal spray Place 1 spray into both nostrils daily as needed for allergies or rhinitis. 1 spray in each nostril every day   furosemide (LASIX) 80 MG tablet Take 1 tablet (80 mg total) by mouth daily.   glucose blood (ONE TOUCH ULTRA TEST) test strip CHECK BLOOD SUGAR TWICE DAILY   insulin glargine (LANTUS SOLOSTAR) 100 UNIT/ML Solostar Pen Inject 10 Units into the skin daily.   Insulin Pen Needle (B-D UF III MINI PEN NEEDLES) 31G X 5 MM MISC INJECT INSULIN VIA PEN 6 TIMES DAILY   isosorbide mononitrate (IMDUR) 30 MG 24 hr tablet TAKE 1/2 OF A TABLET (15 MG TOTAL) BY MOUTH DAILY   Lancets (ONETOUCH DELICA PLUS LANCET33G) MISC APPLY ROUTE AS NEEDED.   Melatonin 3 MG TBDP Take 6 mg by mouth at bedtime as needed (sleep).   metoprolol succinate (TOPROL-XL) 25 MG 24 hr tablet TAKE 1/2 TABLET BY MOUTH DAILY   nitroGLYCERIN (NITROSTAT) 0.4 MG SL tablet Place 0.4 mg under the tongue every 5 (five) minutes as needed for chest pain.   omeprazole (PRILOSEC) 20 MG capsule Take 40 mg by mouth every morning.   rosuvastatin (CRESTOR) 20 MG tablet TAKE 1 TABLET BY MOUTH EVERYDAY AT BEDTIME   spironolactone (ALDACTONE) 25 MG tablet TAKE 1/2 TABLET BY MOUTH EVERY DAY   traZODone (DESYREL) 50 MG tablet TAKE  1 TABLET BY MOUTH AT BEDTIME AS NEEDED FOR SLEEP.   No facility-administered encounter medications on file as of 04/17/2023.    Allergies (verified) Patient has no known allergies.   History: Past Medical History:  Diagnosis Date   Acute pulmonary edema (HCC)    Acute respiratory failure with hypoxemia (HCC)    Acute respiratory failure with hypoxia (HCC)    Allergy    occ uses OTC allergy meds    Breast cancer (HCC) 04/2008   Right breast   Cataracts, bilateral    removed bilat    Cervical cancer (HCC)    When the patient was in her 30s   Depression    Empyema (HCC)    Esophageal perforation 05/05/2021   Failure to thrive in adult  07/22/2021   Wt Readings from Last 3 Encounters: 06/26/21 65.8 kg 06/17/21 64.8 kg 04/26/21 58.1 kg     Gastrostomy tube in place [facility]) 08/21/2021   Heart attack (HCC) 2004   History of cervical cancer    S/p radiation and surgical removal in 30's.  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 Donnie Coffin   PEG (percutaneous endoscopic gastrostomy) status (HCC) 07/23/2021   Personal history of radiation therapy 2009   Pressure injury of skin 05/05/2021   Protein-calorie malnutrition, severe (HCC) 05/15/2021   Pyelonephritis 11/06/2021   S/P thoracotomy    Past Surgical History:  Procedure Laterality Date   BREAST LUMPECTOMY Right 2009   BREAST SURGERY  2009   right lumpectomy   CERVIX SURGERY  1980   CHOLECYSTECTOMY N/A 05/02/2021   Procedure: LAPAROSCOPIC CHOLECYSTECTOMY;  Surgeon: Gaynelle Adu, MD;  Location: [facility] ORS;  Service: General;  Laterality: N/A;   COLONOSCOPY     IR GASTROSTOMY TUBE MOD SED  06/03/2021   IR GASTROSTOMY TUBE REMOVAL  10/08/2021   LEFT HEART CATHETERIZATION WITH CORONARY ANGIOGRAM N/A 04/20/2014   Procedure: LEFT HEART CATHETERIZATION WITH CORONARY ANGIOGRAM;  Surgeon: Othella Boyer, MD;  Location: [facility] CATH LAB;  Service: Cardiovascular;  Laterality: N/A;   POLYPECTOMY     STENT PLACEMENT VASCULAR ([facility] HX)     THORACOTOMY Right 05/05/2021   Procedure: THORACOTOMY MAJOR REPAIR PERFORATED ESOPHAGUS;  Surgeon: Alleen Borne, MD;  Location: [facility] OR;  Service: Thoracic;  Laterality: Right;   thyroid     for nodule hemithyroidectomy, benign   THYROIDECTOMY Right 03/2003   Family History  Problem Relation Age of Onset   Heart disease Mother        age 63   Heart disease Father    Cancer Father        unknown   Heart  disease Brother    Hypertension Brother    Cirrhosis Brother    Diabetes Daughter    Stroke Daughter    Kidney disease Daughter    Hypertension Son    Colon cancer Neg Hx    Colon polyps Neg Hx    Esophageal cancer Neg Hx    Stomach cancer Neg Hx    Rectal cancer Neg Hx    Social History   Socioeconomic History   Marital status: Divorced    Spouse name: Not on file   Number of children: 5   Years of  education: 10   Highest education level: 10th grade  Occupational History   Occupation: RetiredMaterials engineer  Tobacco Use   Smoking status: Never   Smokeless tobacco: Never   Tobacco comments:    No plans to start  Vaping Use   Vaping Use: Never used  Substance and Sexual Activity   Alcohol use: No   Drug use: No   Sexual activity: Not Currently    Birth control/protection: Condom  Other Topics Concern   Not on file  Social History Narrative   Patient lives alone in [place].   Patient is close with daughters Sunny [place] and [place]. One Son in [place]. 2 children have passed.    Patient depends on her sister and brother for support and any assistance needed.    Talbert Forest (sister) [phone].   Religious / Personal Beliefs: Baptist   Patient does not drive.   Diet: Pt has a varied diet of protein, starch, and vegetables and fruits.   Seatbelts: Pt reports wearing seatbelt when in vehicles.    Hobbies: shopping, visiting family, church   Education / Work:  [employer]                                                              Social Determinants of Health   Financial Resource Strain: Low Risk  (04/17/2023)   Overall Financial Resource Strain (CARDIA)    Difficulty of Paying Living Expenses: Not hard at all  Food Insecurity: No Food Insecurity (04/17/2023)   Hunger Vital Sign    Worried About Running Out of Food in the Last Year: Never true    Ran Out of Food in the Last Year: Never true  Transportation Needs: No Transportation Needs  (04/17/2023)   PRAPARE - Administrator, Civil Service (Medical): No    Lack of Transportation (Non-Medical): No  Physical Activity: Insufficiently Active (04/17/2023)   Exercise Vital Sign    Days of Exercise per Week: 3 days    Minutes of Exercise per Session: 30 min  Stress: No Stress Concern Present (04/17/2023)   [employer] of Occupational Health - Occupational Stress Questionnaire    Feeling of Stress : Only a little  Recent Concern: Stress - Stress Concern Present (01/20/2023)   [employer] of Occupational Health - Occupational Stress Questionnaire    Feeling of Stress : To some extent  Social Connections: Moderately Isolated (04/17/2023)   Social Connection and Isolation Panel [NHANES]    Frequency of Communication with Friends and Family: More than three times a week    Frequency of Social Gatherings with Friends and Family: Three times a week    Attends Religious Services: More than 4 times per year    Active Member of Clubs or Organizations: No    Attends Banker Meetings: Never    Marital Status: Divorced    Tobacco Counseling Counseling given: Not Answered Tobacco comments: No plans to start   Clinical Intake:  Pre-visit preparation completed: Yes  Pain : No/denies pain     Diabetes: Yes CBG done?: No Did pt. bring in CBG monitor from home?: No  How often do you need to have someone help you when you read instructions, pamphlets, or other written materials from your doctor or  pharmacy?: 1 - Never  Interpreter Needed?: No  Information entered by :: Kandis Fantasia LPN   Activities of Daily Living    04/17/2023   12:07 PM  In your present state of health, do you have any difficulty performing the following activities:  Hearing? 0  Vision? 0  Difficulty concentrating or making decisions? 0  Walking or climbing stairs? 0  Dressing or bathing? 0  Doing errands, shopping? 0  Preparing Food and eating ? N  Using the Toilet? N   In the past six months, have you accidently leaked urine? N  Do you have problems with loss of bowel control? N  Managing your Medications? N  Managing your Finances? N  Housekeeping or managing your Housekeeping? N    Patient Care Team: Glendale Chard, DO as PCP - General (Family Medicine) Maris Berger, MD (Ophthalmology) Othella Boyer, MD (Cardiology)  Indicate any recent Medical Services you may have received from other than [facility] providers in the past year (date may be approximate).     Assessment:   This is a routine wellness examination for [place].  Hearing/Vision screen Hearing Screening - Comments:: Denies hearing difficulties   Vision Screening - Comments:: Wears rx glasses - up to date with routine eye exams with Dr. Charlotte Sanes    Dietary issues and exercise activities discussed:     Goals Addressed             This Visit's Progress    Remain active and independent         Depression Screen    04/17/2023   12:06 PM 04/14/2023    9:33 AM 03/23/2023    2:22 PM 02/12/2023   10:50 AM 01/20/2023    2:17 PM 01/15/2023    1:55 PM 09/09/2022    2:05 PM  PHQ 2/9 Scores  PHQ - 2 Score 0 0 0 1 0 0 0  PHQ- 9 Score    3 5 5      Fall Risk    04/17/2023   12:07 PM 03/23/2023    1:47 PM 02/12/2023   10:38 AM 01/15/2023    1:55 PM 09/09/2022    2:04 PM  Fall Risk   Falls in the past year? 0 0 0 0 0  Number falls in past yr: 0 0 0 0 0  Injury with Fall? 0 0 0 0 0  Risk for fall due to : No Fall Risks      Follow up Falls prevention discussed;Education provided;Falls evaluation completed        MEDICARE RISK AT HOME:  Medicare Risk at Home - 04/17/23 1207     Any stairs in or around the home? No    If so, are there any without handrails? No    Home free of loose throw rugs in walkways, pet beds, electrical cords, etc? Yes    Adequate lighting in your home to reduce risk of falls? Yes    Life alert? No    Use of a cane, walker or w/c? No    Grab bars in the  bathroom? Yes    Shower chair or bench in shower? No    Elevated toilet seat or a handicapped toilet? Yes             TIMED UP AND GO:  Was the test performed?  No    Cognitive Function:    07/15/2018    2:15 PM 01/03/2014    2:00 PM  MMSE - Mini Mental  State Exam  Orientation to time 5 5  Orientation to Place 5 5  Registration 3 3  Attention/ Calculation 5 4  Recall 3 3  Language- name 2 objects 2 2  Language- repeat 1 1  Language- follow 3 step command 3 3  Language- read & follow direction 1 1  Write a sentence 1 1  Copy design 1 1  Total score 30 29        04/17/2023   12:07 PM 10/17/2021    2:00 PM 07/15/2018    2:15 PM  6CIT Screen  What Year? 0 points 0 points 0 points  What month? 0 points 0 points 0 points  What time? 0 points 0 points 0 points  Count back from 20 0 points 0 points 0 points  Months in reverse 0 points 0 points 0 points  Repeat phrase 0 points 0 points 0 points  Total Score 0 points 0 points 0 points    Immunizations Immunization History  Administered Date(s) Administered   Fluad Quad(high Dose 65+) 08/08/2021, 07/13/2022   Influenza Split 06/30/2011   Influenza Whole 07/24/2008, 08/24/2009, 08/15/2010   Influenza,inj,Quad PF,6+ Mos 06/29/2013, 08/14/2014, 11/07/2014, 08/09/2015, 08/05/2016, 07/14/2017, 06/07/2019   Influenza-Unspecified 07/28/2020   PFIZER Comirnaty(Gray Top)Covid-19 Tri-Sucrose Vaccine 03/25/2021   PFIZER(Purple Top)SARS-COV-2 Vaccination 11/03/2019, 11/24/2019, 07/28/2020   Pneumococcal Conjugate-13 11/16/2015   Pneumococcal Polysaccharide-23 11/14/1999, 08/15/2010, 06/30/2011   Td 03/13/2006   Tdap 11/17/2017    TDAP status: Up to date  Pneumococcal vaccine status: Up to date  Covid-19 vaccine status: Information provided on how to obtain vaccines.   Qualifies for Shingles Vaccine? Yes   Zostavax completed No   Shingrix Completed?: No.    Education has been provided regarding the importance of this  vaccine. Patient has been advised to call insurance company to determine out of pocket expense if they have not yet received this vaccine. Advised may also receive vaccine at local pharmacy or Health Dept. Verbalized acceptance and understanding.  Screening Tests Health Maintenance  Topic Date Due   COVID-19 Vaccine (5 - 2023-24 season) 05/03/2023 (Originally 06/13/2022)   Zoster Vaccines- Shingrix (1 of 2) 07/18/2023 (Originally [birth year]/1992)   INFLUENZA VACCINE  05/14/2023   OPHTHALMOLOGY EXAM  05/31/2023   HEMOGLOBIN A1C  07/17/2023   Diabetic kidney evaluation - Urine ACR  01/15/2024   FOOT EXAM  02/12/2024   Diabetic kidney evaluation - eGFR measurement  03/22/2024   Medicare Annual Wellness (AWV)  04/16/2024   DTaP/Tdap/Td (3 - Td or Tdap) 11/18/2027   Pneumonia Vaccine 37+ Years old  Completed   DEXA SCAN  Completed   HPV VACCINES  Aged Out   Colonoscopy  Discontinued   Hepatitis C Screening  Discontinued    Health Maintenance  There are no preventive care reminders to display for this patient.   Colorectal cancer screening: No longer required.   Mammogram status: No longer required due to age and preference.  Bone Density status: Completed 05/10/12. Results reflect: Bone density results: NORMAL. Repeat every 5 years.  Lung Cancer Screening: (Low Dose CT Chest recommended if Age 73-80 years, 20 pack-year currently smoking OR have quit w/in 15years.) does not qualify.   Lung Cancer Screening Referral: n/a  Additional Screening:  Hepatitis C Screening: does not qualify  Vision Screening: Recommended annual ophthalmology exams for early detection of glaucoma and other disorders of the eye. Is the patient up to date with their annual eye exam?  Yes  Who is the provider or what is  the name of the office in which the patient attends annual eye exams? Dr. Charlotte Sanes  If pt is not established with a provider, would they like to be referred to a provider to establish care? No .    Dental Screening: Recommended annual dental exams for proper oral hygiene  Diabetic Foot Exam: Diabetic Foot Exam: Completed 02/12/23  Community Resource Referral / Chronic Care Management: CRR required this visit?  No   CCM required this visit?  No     Plan:     I have personally reviewed and noted the following in the patient's chart:   Medical and social history Use of alcohol, tobacco or illicit drugs  Current medications and supplements including opioid prescriptions. Patient is not currently taking opioid prescriptions. Functional ability and status Nutritional status Physical activity Advanced directives List of other physicians Hospitalizations, surgeries, and ER visits in previous 12 months Vitals Screenings to include cognitive, depression, and falls Referrals and appointments  In addition, I have reviewed and discussed with patient certain preventive protocols, quality metrics, and best practice recommendations. A written personalized care plan for preventive services as well as general preventive health recommendations were provided to patient.     Kandis Fantasia [place], [place]   10/18/1094   After Visit Summary: (Mail) Due to this being a telephonic visit, the after visit summary with patients personalized plan was offered to patient via mail   Nurse Notes: No concerns

## 2023-04-17 ENCOUNTER — Ambulatory Visit (INDEPENDENT_AMBULATORY_CARE_PROVIDER_SITE_OTHER): Payer: 59

## 2023-04-17 VITALS — Ht 62.0 in | Wt 129.0 lb

## 2023-04-17 DIAGNOSIS — Z0000 Encounter for general adult medical examination without abnormal findings: Secondary | ICD-10-CM | POA: Diagnosis not present

## 2023-04-27 ENCOUNTER — Other Ambulatory Visit (HOSPITAL_COMMUNITY): Payer: Self-pay

## 2023-04-27 ENCOUNTER — Telehealth: Payer: Self-pay

## 2023-04-27 DIAGNOSIS — E1169 Type 2 diabetes mellitus with other specified complication: Secondary | ICD-10-CM

## 2023-04-27 MED ORDER — TRULICITY 1.5 MG/0.5ML ~~LOC~~ SOAJ
1.5000 mg | SUBCUTANEOUS | 5 refills | Status: DC
Start: 2023-04-27 — End: 2023-10-29
  Filled 2023-04-27: qty 2, 28d supply, fill #0
  Filled 2023-05-29: qty 2, 28d supply, fill #1
  Filled 2023-07-01: qty 2, 28d supply, fill #2
  Filled 2023-07-29: qty 2, 28d supply, fill #3
  Filled 2023-09-03: qty 2, 28d supply, fill #4
  Filled 2023-09-30: qty 2, 28d supply, fill #5

## 2023-04-27 NOTE — Telephone Encounter (Signed)
Called [facility] on [address] to confirm they had the correct dose of trulicity in stock.   Sent updated prescription of 1.5 mg weekly. Patient can resume taking the same dose when she picks up the prescription.   Glendale Chard, DO [facility], PGY-2 04/27/23 12:22 PM

## 2023-04-27 NOTE — Telephone Encounter (Signed)
Patient calls nurse line regarding Trulicity prescription.   She states that pharmacy does not have updated prescription for Trulicity.   [facility] does not have medication in stock. Also, trulicity dosage will need to be updated to reflect the increase to 1.5 mg. Patient requests that increased dosage of Trulicity be sent to the [facility] pharmacy.  Patient has not taken medication in two weeks. She is asking if she needs to increase the dosage of her Lantus, due to not taking Trulicity. She reports blood sugar readings between 140's-150's. She is asymptomatic.    Please advise.   Veronda Prude, RN

## 2023-04-28 NOTE — Telephone Encounter (Signed)
Called patient and provided with update.   Hannah C Pipkin, RN  

## 2023-04-30 ENCOUNTER — Other Ambulatory Visit (HOSPITAL_COMMUNITY): Payer: Self-pay

## 2023-05-19 ENCOUNTER — Encounter: Payer: Self-pay | Admitting: Gastroenterology

## 2023-05-19 ENCOUNTER — Ambulatory Visit (INDEPENDENT_AMBULATORY_CARE_PROVIDER_SITE_OTHER): Payer: 59 | Admitting: Gastroenterology

## 2023-05-19 ENCOUNTER — Other Ambulatory Visit (INDEPENDENT_AMBULATORY_CARE_PROVIDER_SITE_OTHER): Payer: 59

## 2023-05-19 VITALS — BP 120/66 | HR 75 | Ht 62.0 in | Wt 125.0 lb

## 2023-05-19 DIAGNOSIS — Z8719 Personal history of other diseases of the digestive system: Secondary | ICD-10-CM | POA: Diagnosis not present

## 2023-05-19 DIAGNOSIS — R194 Change in bowel habit: Secondary | ICD-10-CM

## 2023-05-19 DIAGNOSIS — Z86010 Personal history of colonic polyps: Secondary | ICD-10-CM | POA: Diagnosis not present

## 2023-05-19 DIAGNOSIS — K223 Perforation of esophagus: Secondary | ICD-10-CM

## 2023-05-19 DIAGNOSIS — R1310 Dysphagia, unspecified: Secondary | ICD-10-CM | POA: Diagnosis not present

## 2023-05-19 DIAGNOSIS — R12 Heartburn: Secondary | ICD-10-CM

## 2023-05-19 DIAGNOSIS — K5900 Constipation, unspecified: Secondary | ICD-10-CM

## 2023-05-19 LAB — TSH: TSH: 0.76 u[IU]/mL (ref 0.35–5.50)

## 2023-05-19 NOTE — Progress Notes (Signed)
GASTROENTEROLOGY OUTPATIENT CLINIC VISIT   Primary Care Provider Glendale Chard, DO [address] [place] [address] [phone]  Patient Profile: Dana Coleman is a 82 y.o. female with a pmh significant for CHF (EF 30-35%), atrial fibrillation, hypertension, hyperlipidemia, diabetes, prior breast cancer, prior cervical cancer, status postcholecystectomy, previous esophageal perforation status post surgical treatment, GERD, colon polyps (TA's).  The patient presents to the [facility] for an evaluation and management of problem(s) noted below:  Problem List 1. Hx of adenomatous colonic polyps   2. Change in bowel habits   3. Constipation, unspecified constipation type   4. History of esophageal perforation   5. Dysphagia, unspecified type   6. Pyrosis     History of Present Illness This is the patient's first visit to the outpatient [facility] since her colonoscopy back in 2021.  In 2022 she underwent a cholecystectomy but later was found to have esophageal complication leading to surgical retreatment.  She eventually required a G-tube for further nutrition and over time improved to the point where her G-tube was removed.  She presents today to discuss whether she would like to continue colon polyp surveillance and this for [place] other symptoms that she is experiencing.  She has dealt with constipation for years.  She will go days at a time without a bowel movement she will occasionally take a laxative therapy to help her move her bowels.  She does at times have more gas and bloating and after relieving herself of a bowel movement she will have improvement in her symptoms.  In the evening the patient wakes up with a "bubbling sensation" in her upper abdomen.  This lasted for a few minutes but will relieve itself time.  She does have acid reflux but feels that her symptoms are relatively controlled.  Since her esophageal perforation and surgery after, she has  improved in regard to symptoms of dysphagia that were occurring around that time.  She does not feel she has a severe dysphagia symptoms.  At times she will have some mild regurgitation but she does not vomit regularly.  She is not experiencing significant abdominal pain currently.   GI Review of Systems Positive as above Negative for odynophagia, melena, hematochezia  Review of Systems General: Denies fevers/chills/weight loss unintentionally Cardiovascular: Denies chest pain Pulmonary: Denies shortness of breath Gastroenterological: See HPI Genitourinary: Denies darkened urine Hematological: Denies easy bruising/bleeding Endocrine: Denies temperature intolerance Dermatological: Denies jaundice Psychological: Mood is stable   Medications Current Outpatient Medications  Medication Sig Dispense Refill   acetaminophen (TYLENOL) 500 MG tablet Take 500 mg by mouth every 6 (six) hours as needed for moderate pain.     apixaban (ELIQUIS) 2.5 MG TABS tablet Take 1 tablet (2.5 mg total) by mouth 2 (two) times daily. 60 tablet 8   ascorbic acid (VITAMIN C) 500 MG tablet Take 500 mg by mouth daily.     aspirin EC 81 MG tablet Take 81 mg by mouth in the morning. Swallow whole.     Blood Glucose Monitoring Suppl MISC daily.     docusate (COLACE) 60 MG/15ML syrup Take 60 mg by mouth as needed.     Dulaglutide (TRULICITY) 1.5 MG/0.5ML SOPN Inject 1.5 mg into the skin once a week. 2 mL 5   ENTRESTO 24-26 MG TAKE 1 TABLET BY MOUTH TWICE A DAY 60 tablet 2   fluticasone (FLONASE) 50 MCG/ACT nasal spray Place 1 spray into both nostrils daily as needed for allergies or  rhinitis. 1 spray in each nostril every day 16 g 12   furosemide (LASIX) 80 MG tablet Take 1 tablet (80 mg total) by mouth daily. 30 tablet 11   glucose blood (ONE TOUCH ULTRA TEST) test strip CHECK BLOOD SUGAR TWICE DAILY 100 each 1   insulin glargine (LANTUS SOLOSTAR) 100 UNIT/ML Solostar Pen Inject 10 Units into the skin daily. 3 mL 0    Insulin Pen Needle (B-D UF III MINI PEN NEEDLES) 31G X 5 MM MISC INJECT INSULIN VIA PEN 6 TIMES DAILY 200 each 3   isosorbide mononitrate (IMDUR) 30 MG 24 hr tablet TAKE 1/2 OF A TABLET (15 MG TOTAL) BY MOUTH DAILY 45 tablet 1   Lancets (ONETOUCH DELICA PLUS LANCET33G) MISC APPLY ROUTE AS NEEDED. 100 each PRN   Melatonin 3 MG TBDP Take 6 mg by mouth at bedtime as needed (sleep).     metoprolol succinate (TOPROL-XL) 25 MG 24 hr tablet TAKE 1/2 TABLET BY MOUTH DAILY 45 tablet 3   nitroGLYCERIN (NITROSTAT) 0.4 MG SL tablet Place 0.4 mg under the tongue every 5 (five) minutes as needed for chest pain.     omeprazole (PRILOSEC) 20 MG capsule Take 40 mg by mouth every morning.     rosuvastatin (CRESTOR) 20 MG tablet TAKE 1 TABLET BY MOUTH EVERYDAY AT BEDTIME 90 tablet 1   spironolactone (ALDACTONE) 25 MG tablet TAKE 1/2 TABLET BY MOUTH EVERY DAY 45 tablet 3   traZODone (DESYREL) 50 MG tablet TAKE 1 TABLET BY MOUTH AT BEDTIME AS NEEDED FOR SLEEP. 90 tablet 1   No current facility-administered medications for this visit.    Allergies No Known Allergies  Histories Past Medical History:  Diagnosis Date   Acute pulmonary edema (HCC)    Acute respiratory failure with hypoxemia (HCC)    Acute respiratory failure with hypoxia (HCC)    Allergy    occ uses OTC allergy meds    Breast cancer (HCC) 04/2008   Right breast   Cataracts, bilateral    removed bilat    Cervical cancer (HCC)    When the patient was in her 30s   Depression    Empyema (HCC)    Esophageal perforation 05/05/2021   Failure to thrive in adult 07/22/2021   Wt Readings from Last 3 Encounters: 06/26/21 65.8 kg 06/17/21 64.8 kg 04/26/21 58.1 kg     Gastrostomy tube in place [facility]) 08/21/2021   Heart attack (HCC) 2004   History of cervical cancer    S/p radiation and surgical removal in 30's.  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 Donnie Coffin   PEG (percutaneous endoscopic gastrostomy) status (HCC) 07/23/2021   Personal history of radiation therapy 2009   Pressure injury of skin 05/05/2021   Protein-calorie malnutrition, severe (HCC) 05/15/2021   Pyelonephritis 11/06/2021   S/P thoracotomy    Past Surgical History:  Procedure Laterality Date   BREAST LUMPECTOMY Right 2009   BREAST SURGERY  2009   right lumpectomy   CERVIX SURGERY  1980   CHOLECYSTECTOMY N/A 05/02/2021   Procedure: LAPAROSCOPIC CHOLECYSTECTOMY;  Surgeon: Gaynelle Adu, MD;  Location: [facility] ORS;  Service: General;  Laterality: N/A;   COLONOSCOPY     IR GASTROSTOMY TUBE MOD  SED  06/03/2021   IR GASTROSTOMY TUBE REMOVAL  10/08/2021   LEFT HEART CATHETERIZATION WITH CORONARY ANGIOGRAM N/A 04/20/2014   Procedure: LEFT HEART CATHETERIZATION WITH CORONARY ANGIOGRAM;  Surgeon: Othella Boyer, MD;  Location: [facility] CATH LAB;  Service: Cardiovascular;  Laterality: N/A;   POLYPECTOMY     STENT PLACEMENT VASCULAR ([facility] HX)     THORACOTOMY Right 05/05/2021   Procedure: THORACOTOMY MAJOR REPAIR PERFORATED ESOPHAGUS;  Surgeon: Alleen Borne, MD;  Location: [facility] OR;  Service: Thoracic;  Laterality: Right;   thyroid     for nodule hemithyroidectomy, benign   THYROIDECTOMY Right 03/2003   Social History   Socioeconomic History   Marital status: Divorced    Spouse name: Not on file   Number of children: 5   Years of education: 10   Highest education level: 10th grade  Occupational History   Occupation: Retired- Metallurgist  Tobacco Use   Smoking status: Never   Smokeless tobacco: Never   Tobacco comments:    No plans to start  Vaping Use   Vaping status: Never Used  Substance and Sexual Activity   Alcohol use: No   Drug use: No   Sexual activity: Not Currently    Birth control/protection: Condom   Other Topics Concern   Not on file  Social History Narrative   Patient lives alone in [place].   Patient is close with daughters Sunny Schlein and [place]. One Son in [place]. 2 children have passed.    Patient depends on her sister and brother for support and any assistance needed.    Talbert Forest (sister) [phone].   Religious / Personal Beliefs: Baptist   Patient does not drive.   Diet: Pt has a varied diet of protein, starch, and vegetables and fruits.   Seatbelts: Pt reports wearing seatbelt when in vehicles.    Hobbies: shopping, visiting family, church   Education / Work:  [employer]                                                              Social Determinants of Health   Financial Resource Strain: Low Risk  (04/17/2023)   Overall Financial Resource Strain (CARDIA)    Difficulty of Paying Living Expenses: Not hard at all  Food Insecurity: No Food Insecurity (04/17/2023)   Hunger Vital Sign    Worried About Running Out of Food in the Last Year: Never true    Ran Out of Food in the Last Year: Never true  Transportation Needs: No Transportation Needs (04/17/2023)   PRAPARE - Administrator, Civil Service (Medical): No    Lack of Transportation (Non-Medical): No  Physical Activity: Insufficiently Active (04/17/2023)   Exercise Vital Sign    Days of Exercise per Week: 3 days    Minutes of Exercise per Session: 30 min  Stress: No Stress Concern Present (04/17/2023)   [employer] of Occupational Health - Occupational Stress Questionnaire    Feeling of Stress : Only a little  Recent Concern: Stress - Stress Concern Present (01/20/2023)   [employer] of Occupational Health - Occupational Stress Questionnaire    Feeling of Stress : To some extent  Social Connections: Moderately Isolated (04/17/2023)   Social Connection and Isolation Panel [NHANES]  Frequency of Communication with Friends and Family: More than three times a week    Frequency of  Social Gatherings with Friends and Family: Three times a week    Attends Religious Services: More than 4 times per year    Active Member of Clubs or Organizations: No    Attends Banker Meetings: Never    Marital Status: Divorced  Catering manager Violence: Not At Risk (04/17/2023)   Humiliation, Afraid, Rape, and Kick questionnaire    Fear of Current or Ex-Partner: No    Emotionally Abused: No    Physically Abused: No    Sexually Abused: No   Family History  Problem Relation Age of Onset   Heart disease Mother        age 77   Heart disease Father    Cancer Father        unknown   Heart disease Brother    Hypertension Brother    Cirrhosis Brother    Diabetes Daughter    Stroke Daughter    Kidney disease Daughter    Hypertension Son    Colon cancer Neg Hx    Colon polyps Neg Hx    Esophageal cancer Neg Hx    Stomach cancer Neg Hx    Rectal cancer Neg Hx    Inflammatory bowel disease Neg Hx    Liver disease Neg Hx    Pancreatic cancer Neg Hx    I have reviewed her medical, social, and family history in detail and updated the electronic medical record as necessary.    PHYSICAL EXAMINATION  BP 120/66   Pulse 75   Ht 5\' 2"  (1.575 m)   Wt 125 lb (56.7 kg)   SpO2 98%   BMI 22.86 kg/m  Wt Readings from Last 3 Encounters:  05/19/23 125 lb (56.7 kg)  04/17/23 129 lb (58.5 kg)  03/23/23 129 lb 3.2 oz (58.6 kg)  GEN: NAD, appears stated age, doesn't appear chronically ill PSYCH: Cooperative, without pressured speech EYE: Conjunctivae pink, sclerae anicteric ENT: MMM CV: Nontachycardic RESP: No audible wheezing GI: NABS, soft, NT/ND, surgical scars present, without rebound or guarding MSK/EXT: No significant lower extremity edema SKIN: No jaundice NEURO:  Alert & Oriented x 3, no focal deficits   REVIEW OF DATA  I reviewed the following data at the time of this encounter:  GI Procedures and Studies  2021 colonoscopy - Perianal skin tags found on  perianal exam. - Hemorrhoids found on digital rectal exam. - Three 2 to 4 mm polyps at the recto-sigmoid colon and in the cecum, removed with a cold snare. Resected and retrieved. - Normal mucosa in the entire examined colon. - Diverticulosis in the recto-sigmoid colon, in the sigmoid colon, in the descending colon, in the transverse colon and in the ascending colon. - Non-bleeding non-thrombosed external and internal hemorrhoids.  Pathology Diagnosis Surgical [P], colon, cecum and rectosigmoid, polyp (3) - TUBULAR ADENOMA(S) - NEGATIVE FOR HIGH-GRADE DYSPLASIA OR MALIGNANCY  Laboratory Studies  Reviewed those in epic  Imaging Studies  October 2022 CT abdomen pelvis with contrast IMPRESSION: 1. No acute CT findings of the abdomen or pelvis. No evidence of bowel obstruction. 2. Percutaneous gastrostomy tube.   ASSESSMENT  Ms. Job is a 82 y.o. female with a pmh significant for CHF (EF 30-35%), atrial fibrillation, hypertension, hyperlipidemia, diabetes, prior breast cancer, prior cervical cancer, status postcholecystectomy, previous esophageal perforation status post surgical treatment, GERD, colon polyps (TA's).  The patient is seen today for evaluation and  management of:  1. Hx of adenomatous colonic polyps   2. Change in bowel habits   3. Constipation, unspecified constipation type   4. History of esophageal perforation   5. Dysphagia, unspecified type   6. Pyrosis    The patient is hemodynamically and clinically stable at this time.  Patient's overall improved from where she was in 2022 in 2023 after a pretty significant cholecystectomy and esophageal perforation.  It seems like she has not regained significant improvement in regards to her swallowing though at times will still experience pyrosis and some very mild regurgitation.  Will going to begin by pursuing a barium swallow to get a better sense of how the esophagus looks.  Based on how the barium swallow looks we will  consider the role of an upper endoscopy.  Based on the patient's low EF, I think we want to be mindful about considering repeat colonoscopy.  She does look well and I think she probably would tolerate preparation and anesthesia, but she is not sure if she wants to move forward with that and I am okay with holding on scheduling the colonoscopy.  Will see her back a couple of months and make a final decision as to whether we will stop colon polyp surveillance.  Will check her thyroid function regards to her chronic constipation and give her some recommendations in regards to a bowel regimen and fiber supplementation.  All patient questions were answered to the best of my ability, and the patient agrees to the aforementioned plan of action with follow-up as indicated.   PLAN  Proceed with barium swallow Continue PPI 40 mg daily (take before a meal) Toileting instructions discussed with patient Initiate FiberCon once to twice daily Initiate MiraLAX daily to every other day use Dulcolax every other day as needed in an effort of having at least 3 bowel movements per week Samples of Linzess 72 mcg have been given to patient trial if a 2 to 3-week course of MiraLAX +/- Dulcolax is not helpful We will follow-up in clinic in a few months to decide upon any further endoscopic evaluation being pursued from above or below based on patient's other medical comorbidities   Orders Placed This Encounter  Procedures   DG ESOPHAGUS W SINGLE CM (SOL OR THIN BA)   TSH    New Prescriptions   No medications on file   Modified Medications   No medications on file    Planned Follow Up No follow-ups on file.   Total Time in Face-to-Face and in Coordination of Care for patient including independent/personal interpretation/review of prior testing, medical history, examination, medication adjustment, communicating results with the patient directly, and documentation within the EHR is 25 minutes.   Corliss Parish, MD [facility] Advanced [facility]oscopy Office # [phone]

## 2023-05-19 NOTE — Patient Instructions (Addendum)
Your provider has requested that you go to the basement level for lab work before leaving today. Press "B" on the elevator. The lab is located at the first door on the left as you exit the elevator.  Toileting tips to help with your constipation - Drink at least 32 ounces of water/liquid per day. - Establish a time to try to move your bowels every day.  For many people, this is after a cup of coffee or after a meal such as breakfast. - Sit all of the way back on the toilet keeping your back fairly straight and while sitting up, try to rest the tops of your forearms on your upper thighs.   - Raising your feet with a step stool/squatty potty can be helpful to improve the angle that allows your stool to pass through the rectum. - Relax the rectum feeling it bulge toward the toilet water.  If you feel your rectum raising toward your body, you are contracting rather than relaxing. - Breathe in and slowly exhale. "Belly breath" by expanding your belly towards your belly button. Keep belly expanded as you gently direct pressure down and back to the anus.  A low pitched GRRR sound can assist with increasing intra-abdominal pressure.  - Repeat 3-4 times. If unsuccessful, contract the pelvic floor to restore normal tone and get off the toilet.  Avoid excessive straining. - To reduce excessive wiping by teaching your anus to normally contract, place hands on outer aspect of knees and resist knee movement outward.  Hold 5-10 second then place hands just inside of knees and resist inward movement of knees.  Hold 5 seconds.  Repeat a few times each way.   Try to increase fluid to 32 ounces daily.   Start Fiber-Con once daily   Use Miralax 1 capful daily in at least 8 ounces of water every other day OR Use Dulcolax 10 mg -take 1 pill by mouth every other day.   If above regimen does not work, then try Linzess 72 mcg - 1 pill by mouth once daily. (Samples provided today. )  You have been scheduled for a Barium  Esophogram at [facility] (1st floor of the hospital) on 06/09/23 at 11:00 am. Please arrive 30 minutes prior to your appointment for registration. Make certain not to have anything to eat or drink 3 hours prior to your test. If you need to reschedule for any reason, please contact radiology at [phone] to do so. __________________________________________________________________ A barium swallow is an examination that concentrates on views of the esophagus. This tends to be a double contrast exam (barium and two liquids which, when combined, create a gas to distend the wall of the oesophagus) or single contrast (non-ionic iodine based). The study is usually tailored to your symptoms so a good history is essential. Attention is paid during the study to the form, structure and configuration of the esophagus, looking for functional disorders (such as aspiration, dysphagia, achalasia, motility and reflux) EXAMINATION You may be asked to change into a gown, depending on the type of swallow being performed. A radiologist and radiographer will perform the procedure. The radiologist will advise you of the type of contrast selected for your procedure and direct you during the exam. You will be asked to stand, sit or lie in several different positions and to hold a small amount of fluid in your mouth before being asked to swallow while the imaging is performed .In some instances you may be asked to swallow  barium coated marshmallows to assess the motility of a solid food bolus. The exam can be recorded as a digital or video fluoroscopy procedure. POST PROCEDURE It will take 1-2 days for the barium to pass through your system. To facilitate this, it is important, unless otherwise directed, to increase your fluids for the next 24-48hrs and to resume your normal diet.  This test typically takes about 30 minutes to  perform. __________________________________________________________________________________  Follow -up on : 07/15/23 at 10:30 am with Dr Meridee Score   Due to recent changes in healthcare laws, you may see the results of your imaging and laboratory studies on MyChart before your provider has had a chance to review them.  We understand that in some cases there may be results that are confusing or concerning to you. Not all laboratory results come back in the same time frame and the provider may be waiting for multiple results in order to interpret others.  Please give [place] 48 hours in order for your provider to thoroughly review all the results before contacting the office for clarification of your results.   Thank you for choosing me and [facility].  Dr. Meridee Score

## 2023-05-23 ENCOUNTER — Encounter: Payer: Self-pay | Admitting: Gastroenterology

## 2023-05-23 DIAGNOSIS — R1310 Dysphagia, unspecified: Secondary | ICD-10-CM | POA: Insufficient documentation

## 2023-05-23 DIAGNOSIS — R194 Change in bowel habit: Secondary | ICD-10-CM | POA: Insufficient documentation

## 2023-05-23 DIAGNOSIS — R12 Heartburn: Secondary | ICD-10-CM | POA: Insufficient documentation

## 2023-05-23 DIAGNOSIS — K5900 Constipation, unspecified: Secondary | ICD-10-CM | POA: Insufficient documentation

## 2023-05-23 DIAGNOSIS — Z86010 Personal history of colonic polyps: Secondary | ICD-10-CM | POA: Insufficient documentation

## 2023-05-28 ENCOUNTER — Other Ambulatory Visit (HOSPITAL_COMMUNITY): Payer: Self-pay | Admitting: Internal Medicine

## 2023-05-28 ENCOUNTER — Other Ambulatory Visit: Payer: Self-pay | Admitting: Student

## 2023-05-29 ENCOUNTER — Other Ambulatory Visit (HOSPITAL_COMMUNITY): Payer: Self-pay

## 2023-06-04 ENCOUNTER — Ambulatory Visit (HOSPITAL_COMMUNITY)
Admission: RE | Admit: 2023-06-04 | Discharge: 2023-06-04 | Disposition: A | Discharge status: Home / Self Care | Payer: 59 | Source: Ambulatory Visit | Attending: Physician Assistant | Admitting: Physician Assistant

## 2023-06-04 ENCOUNTER — Encounter (HOSPITAL_COMMUNITY): Payer: Self-pay

## 2023-06-04 VITALS — BP 138/80 | HR 74 | Ht 62.0 in | Wt 125.8 lb

## 2023-06-04 DIAGNOSIS — Z955 Presence of coronary angioplasty implant and graft: Secondary | ICD-10-CM | POA: Diagnosis not present

## 2023-06-04 DIAGNOSIS — Z853 Personal history of malignant neoplasm of breast: Secondary | ICD-10-CM | POA: Insufficient documentation

## 2023-06-04 DIAGNOSIS — Z794 Long term (current) use of insulin: Secondary | ICD-10-CM | POA: Diagnosis not present

## 2023-06-04 DIAGNOSIS — Z9889 Other specified postprocedural states: Secondary | ICD-10-CM | POA: Diagnosis not present

## 2023-06-04 DIAGNOSIS — I5020 Unspecified systolic (congestive) heart failure: Secondary | ICD-10-CM | POA: Diagnosis not present

## 2023-06-04 DIAGNOSIS — R5383 Other fatigue: Secondary | ICD-10-CM | POA: Insufficient documentation

## 2023-06-04 DIAGNOSIS — Z79899 Other long term (current) drug therapy: Secondary | ICD-10-CM | POA: Insufficient documentation

## 2023-06-04 DIAGNOSIS — I480 Paroxysmal atrial fibrillation: Secondary | ICD-10-CM | POA: Diagnosis not present

## 2023-06-04 DIAGNOSIS — Z9011 Acquired absence of right breast and nipple: Secondary | ICD-10-CM | POA: Insufficient documentation

## 2023-06-04 DIAGNOSIS — R079 Chest pain, unspecified: Secondary | ICD-10-CM | POA: Insufficient documentation

## 2023-06-04 DIAGNOSIS — Z7901 Long term (current) use of anticoagulants: Secondary | ICD-10-CM | POA: Diagnosis not present

## 2023-06-04 DIAGNOSIS — E785 Hyperlipidemia, unspecified: Secondary | ICD-10-CM | POA: Diagnosis not present

## 2023-06-04 DIAGNOSIS — Z7982 Long term (current) use of aspirin: Secondary | ICD-10-CM | POA: Diagnosis not present

## 2023-06-04 DIAGNOSIS — I110 Hypertensive heart disease with heart failure: Secondary | ICD-10-CM | POA: Diagnosis not present

## 2023-06-04 DIAGNOSIS — I2510 Atherosclerotic heart disease of native coronary artery without angina pectoris: Secondary | ICD-10-CM | POA: Diagnosis not present

## 2023-06-04 DIAGNOSIS — I5082 Biventricular heart failure: Secondary | ICD-10-CM | POA: Diagnosis not present

## 2023-06-04 DIAGNOSIS — Z87440 Personal history of urinary (tract) infections: Secondary | ICD-10-CM | POA: Diagnosis not present

## 2023-06-04 DIAGNOSIS — Z923 Personal history of irradiation: Secondary | ICD-10-CM | POA: Diagnosis not present

## 2023-06-04 DIAGNOSIS — R6521 Severe sepsis with septic shock: Secondary | ICD-10-CM | POA: Insufficient documentation

## 2023-06-04 DIAGNOSIS — I10 Essential (primary) hypertension: Secondary | ICD-10-CM

## 2023-06-04 DIAGNOSIS — Z86711 Personal history of pulmonary embolism: Secondary | ICD-10-CM | POA: Diagnosis not present

## 2023-06-04 DIAGNOSIS — E1136 Type 2 diabetes mellitus with diabetic cataract: Secondary | ICD-10-CM | POA: Diagnosis not present

## 2023-06-04 LAB — BRAIN NATRIURETIC PEPTIDE: B Natriuretic Peptide: 98.3 pg/mL (ref 0.0–100.0)

## 2023-06-04 LAB — BASIC METABOLIC PANEL
Anion gap: 10 (ref 5–15)
BUN: 16 mg/dL (ref 8–23)
CO2: 24 mmol/L (ref 22–32)
Calcium: 9.2 mg/dL (ref 8.9–10.3)
Chloride: 104 mmol/L (ref 98–111)
Creatinine, Ser: 1.23 mg/dL — ABNORMAL HIGH (ref 0.44–1.00)
GFR, Estimated: 44 mL/min — ABNORMAL LOW (ref >60)
Glucose, Bld: 140 mg/dL — ABNORMAL HIGH (ref 70–99)
Potassium: 4.7 mmol/L (ref 3.5–5.1)
Sodium: 138 mmol/L (ref 135–145)

## 2023-06-04 NOTE — Patient Instructions (Signed)
Medication Changes:  No Changes In Medications at this time.   Lab Work:  Labs done today, your results will be available in MyChart, we will contact you for abnormal readings.   Testing/Procedures:  Your physician has requested that you have an echocardiogram in 4 months. Echocardiography is a painless test that uses sound waves to create images of your heart. It provides your doctor with information about the size and shape of your heart and how well your heart's chambers and valves are working. This procedure takes approximately one hour. There are no restrictions for this procedure. Please do NOT wear cologne, perfume, aftershave, or lotions (deodorant is allowed). Please arrive 15 minutes prior to your appointment time.   Follow-Up in: 4 months as scheduled with APP  At the [facility], you and your health needs are our priority. We have a designated team specialized in the treatment of Heart Failure. This Care Team includes your primary Heart Failure Specialized Cardiologist (physician), Advanced Practice Providers (APPs- Physician Assistants and Nurse Practitioners), and Pharmacist who all work together to provide you with the care you need, when you need it.   You may see any of the following providers on your designated Care Team at your next follow up:  Dr. Arvilla Meres Dr. Marca Ancona Dr. Marcos Eke, NP Robbie Lis, [place] [facility] [place], [place] Brynda Peon, NP Karle Plumber, PharmD   Please be sure to bring in all your medications bottles to every appointment.   Need to Contact us:  If you have any questions or concerns before your next appointment please send [place] a message through [place] or call our office at [phone].    TO LEAVE A MESSAGE FOR THE NURSE SELECT OPTION 2, PLEASE LEAVE A MESSAGE INCLUDING: YOUR NAME DATE OF BIRTH CALL BACK NUMBER REASON FOR CALL**this is important as we prioritize the call  backs  YOU WILL RECEIVE A CALL BACK THE SAME DAY AS LONG AS YOU CALL BEFORE 4:00 PM

## 2023-06-04 NOTE — Progress Notes (Signed)
ReDS Vest / Clip - 06/04/23 1459       ReDS Vest / Clip   Station Marker A    Ruler Value 22    ReDS Value Range Low volume    ReDS Actual Value 32

## 2023-06-04 NOTE — Progress Notes (Addendum)
ADVANCED HF CLINIC NOTE  Primary Care: Glendale Chard, DO Primary Cardiologist: Dr. Sharyn Lull HF Cardiologist: Dr. Gala Romney  HPI: Dana Coleman is a 82 y.o. female w/ h/o CAD, s/p remote inferior wall MI in 2000 treated w/ PCI to RCA, hypertension, type 2DM, HLD and h/o right sided breast cancer treated w/ radiation and mastectomy, no chemotherapy.    Followed by Dr. Sharyn Lull Echo 2019 EF 55-60%, G1D, RV normal  NST 2019 c/w prior inferior myocardial infarction. Small mild distal anterior/apical infarct with mild peri-infarct ischemia>> Low risk study    She was extremely sick 04/2021 w/ nearly 6 wk hospitalization. She had laparoscopic cholecystectomy on 05/02/21, discharged home, developed abdominal pain associated with sudden onset of altered mental status noted to be hypotensive, bradycardic and hypoxic by EMS requiring intubation and subsequently was noted to have a right empyema and perforation of the esophagus requiring emergent thoracotomy w/ repair of esophageal perforation with intercostal pedicled muscle flap, drainage of empyema and decortication of right lung.    Post-op she developed EKG changes w/ minimal inferior ST elevation and troponin leak. Echo showed moderately reduced LVEF, 40%, and severely reduced RV systolic function. However given recent surgery, she was not a candidate for intervention. She later developed increased O2 requirements, subsequently had CT angiogram which showed bilateral pulmonary embolism.  Repeat limited echo showed EF 25-30%, RV normal. No evidence of significant strain>>>IV heparin>>Eliquis. Hospitalization also notable for atrial flutter. Dr. Sharyn Lull followed during hospitalization.    Of note EKG 1/23 showed afib w/ CVR. F/u EKGs 3/23 showed NSR.   Admit 3/23 for a/c CHF. Remained in NSR during admission. Echo EF 25-30%, RV moderately reduced right ventricular systolic pressure 31.7 mmHg, mild MR. Diuresed 9 lb down to a dry wt of 119  lb.  Admitted 6/23 with CP, cardiology consulted. CHF compensated and underwent nuclear stress test which showed LVEF 25%, large fixed defect involving apical and inferior myocardium consistent with old infarction. Hospitalization c/b dizziness and low BP. GDMT scaled back.  Echo 06/02/22 EF 30-35% previous inferior, inferolateral MI   ICD deferred given age and overall situation.  Here today for 6 month follow-up. She is accompanied by her sister who assists with providing the history. Has been feeling okay. Gets fatigued easily with activity. Ambulates with a cane. Notes chronic dyspnea with exertion. No lower extremity edema, + abdominal bloating which is an ongoing issue. Sleeps with 2 pillows but does not report orthopnea. Home weight stable around 125 lb. Does get dizzy at times with position changes. No syncope. Does not monitor BP at home. Taking all medications as prescribed.   ReDS 32%   Cardiac Testing   - Echo (3/19): EF 55-60%, G1D, RV normal    - NST 12/2017  There was no ST segment deviation noted during stress. T wave inversion was noted during stress in the III leads, beginning at 1 minutes of stress. T wave inversion persisted. Findings consistent with prior inferior myocardial infarction. Small mild distal anterior/apical infarct with mild peri-infarct ischemia. This is a low risk study. Mild area of current ischemia. The left ventricular ejection fraction is normal (55-65%).     - Echo (7/22): EF 45%, RV severely reduced   - Ltd Echo (8/22): EF 25-30%, RV normal   - Echo (3/23): EF 25-30%, RV moderately reduced right ventricular systolic pressure is 31.7 mmHg, mild MR   - Lexiscan myoview (6/23): LVEF 25%, large fixed defect apical and inferior myocardium, consistent with old infarction.   -  Echo (8/23): EF 30-35%, previous inferior, inferolateral MI.   Past Medical History:  Diagnosis Date   Acute pulmonary edema (HCC)    Acute respiratory failure with  hypoxemia (HCC)    Acute respiratory failure with hypoxia (HCC)    Allergy    occ uses OTC allergy meds    Breast cancer (HCC) 04/2008   Right breast   Cataracts, bilateral    removed bilat    Cervical cancer (HCC)    When the patient was in her 30s   Depression    Empyema (HCC)    Esophageal perforation 05/05/2021   Failure to thrive in adult 07/22/2021   Wt Readings from Last 3 Encounters: 06/26/21 65.8 kg 06/17/21 64.8 kg 04/26/21 58.1 kg     Gastrostomy tube in place [facility]) 08/21/2021   Heart attack (HCC) 2004   History of cervical cancer    S/p radiation and surgical removal in 30's.  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 Donnie Coffin   PEG (percutaneous endoscopic gastrostomy) status (HCC) 07/23/2021   Personal history of radiation therapy 2009   Pressure injury of skin 05/05/2021   Protein-calorie malnutrition, severe (HCC) 05/15/2021   Pyelonephritis 11/06/2021   S/P thoracotomy     Current Outpatient Medications  Medication Sig Dispense Refill   acetaminophen (TYLENOL) 500 MG tablet Take 500 mg by mouth every 6 (six) hours as needed for moderate pain.     apixaban (ELIQUIS) 2.5 MG TABS tablet Take 1 tablet (2.5 mg total) by mouth 2 (two) times daily. 60 tablet 8   ascorbic acid (VITAMIN C) 500 MG tablet Take 500 mg by mouth daily.     aspirin EC 81 MG tablet Take 81 mg by mouth in the morning. Swallow whole.     Blood Glucose Monitoring Suppl MISC daily.     docusate (COLACE) 60 MG/15ML syrup Take 60 mg by mouth as needed.     Dulaglutide (TRULICITY) 1.5 MG/0.5ML SOPN Inject 1.5 mg into the skin once a week. 2 mL 5   ENTRESTO 24-26 MG TAKE 1 TABLET BY MOUTH TWICE A DAY 60 tablet 2   fluticasone (FLONASE) 50 MCG/ACT nasal spray  Place 1 spray into both nostrils daily as needed for allergies or rhinitis. 1 spray in each nostril every day 16 g 12   furosemide (LASIX) 80 MG tablet Take 1 tablet (80 mg total) by mouth daily. 30 tablet 11   glucose blood (ONE TOUCH ULTRA TEST) test strip CHECK BLOOD SUGAR TWICE DAILY 100 each 1   insulin glargine (LANTUS SOLOSTAR) 100 UNIT/ML Solostar Pen Inject 10 Units into the skin daily. 3 mL 0   Insulin Pen Needle (B-D UF III MINI PEN NEEDLES) 31G X 5 MM MISC INJECT INSULIN VIA PEN 6 TIMES DAILY 200 each 3   isosorbide mononitrate (IMDUR) 30 MG 24 hr tablet TAKE 1/2 OF A TABLET (15 MG TOTAL) BY MOUTH DAILY 45 tablet 1   Lancets (ONETOUCH DELICA PLUS LANCET33G) MISC APPLY ROUTE AS NEEDED. 100 each PRN   Melatonin 3 MG TBDP Take 6 mg by mouth at bedtime as needed (sleep).     metoprolol succinate (TOPROL-XL) 25 MG 24 hr tablet TAKE 1/2 TABLET BY MOUTH DAILY 45  tablet 3   omeprazole (PRILOSEC) 20 MG capsule Take 40 mg by mouth every morning.     rosuvastatin (CRESTOR) 20 MG tablet TAKE 1 TABLET BY MOUTH EVERYDAY AT BEDTIME 90 tablet 1   spironolactone (ALDACTONE) 25 MG tablet TAKE 1/2 TABLET BY MOUTH EVERY DAY 45 tablet 3   traZODone (DESYREL) 50 MG tablet TAKE 1 TABLET BY MOUTH AT BEDTIME AS NEEDED FOR SLEEP. 90 tablet 1   nitroGLYCERIN (NITROSTAT) 0.4 MG SL tablet Place 0.4 mg under the tongue every 5 (five) minutes as needed for chest pain. (Patient not taking: Reported on 06/04/2023)     No current facility-administered medications for this encounter.   No Known Allergies  Social History   Socioeconomic History   Marital status: Divorced    Spouse name: Not on file   Number of children: 5   Years of education: 10   Highest education level: 10th grade  Occupational History   Occupation: Retired- Metallurgist  Tobacco Use   Smoking status: Never   Smokeless tobacco: Never   Tobacco comments:    No plans to start  Vaping Use   Vaping status: Never Used  Substance  and Sexual Activity   Alcohol use: No   Drug use: No   Sexual activity: Not Currently    Birth control/protection: Condom  Other Topics Concern   Not on file  Social History Narrative   Patient lives alone in [place].   Patient is close with daughters Sunny Sch[place] and [place]. One Son in [place]. 2 children have passed.    Patient depends on her sister and brother for support and any assistance needed.    Talbert Forest (sister) [phone].   Religious / Personal Beliefs: Baptist   Patient does not drive.   Diet: Pt has a varied diet of protein, starch, and vegetables and fruits.   Seatbelts: Pt reports wearing seatbelt when in vehicles.    Hobbies: shopping, visiting family, church   Education / Work:  [employer]                                                              Social Determinants of Health   Financial Resource Strain: Low Risk  (04/17/2023)   Overall Financial Resource Strain (CARDIA)    Difficulty of Paying Living Expenses: Not hard at all  Food Insecurity: No Food Insecurity (04/17/2023)   Hunger Vital Sign    Worried About Running Out of Food in the Last Year: Never true    Ran Out of Food in the Last Year: Never true  Transportation Needs: No Transportation Needs (04/17/2023)   PRAPARE - Administrator, Civil Service (Medical): No    Lack of Transportation (Non-Medical): No  Physical Activity: Insufficiently Active (04/17/2023)   Exercise Vital Sign    Days of Exercise per Week: 3 days    Minutes of Exercise per Session: 30 min  Stress: No Stress Concern Present (04/17/2023)   [employer] of Occupational Health - Occupational Stress Questionnaire    Feeling of Stress : Only a little  Recent Concern: Stress - Stress Concern Present (01/20/2023)   [employer] of Occupational Health - Occupational Stress Questionnaire    Feeling of Stress : To some extent  Social Connections: Moderately  Isolated (04/17/2023)   Social Connection and  Isolation Panel [NHANES]    Frequency of Communication with Friends and Family: More than three times a week    Frequency of Social Gatherings with Friends and Family: Three times a week    Attends Religious Services: More than 4 times per year    Active Member of Clubs or Organizations: No    Attends Banker Meetings: Never    Marital Status: Divorced  Catering manager Violence: Not At Risk (04/17/2023)   Humiliation, Afraid, Rape, and Kick questionnaire    Fear of Current or Ex-Partner: No    Emotionally Abused: No    Physically Abused: No    Sexually Abused: No   Family History  Problem Relation Age of Onset   Heart disease Mother        age 61   Heart disease Father    Cancer Father        unknown   Heart disease Brother    Hypertension Brother    Cirrhosis Brother    Diabetes Daughter    Stroke Daughter    Kidney disease Daughter    Hypertension Son    Colon cancer Neg Hx    Colon polyps Neg Hx    Esophageal cancer Neg Hx    Stomach cancer Neg Hx    Rectal cancer Neg Hx    Inflammatory bowel disease Neg Hx    Liver disease Neg Hx    Pancreatic cancer Neg Hx    BP 138/80   Pulse 74   Ht 5\' 2"  (1.575 m)   Wt 57.1 kg (125 lb 12.8 oz)   SpO2 97%   BMI 23.01 kg/m   Wt Readings from Last 3 Encounters:  06/04/23 57.1 kg (125 lb 12.8 oz)  05/19/23 56.7 kg (125 lb)  04/17/23 58.5 kg (129 lb)   PHYSICAL EXAM: General:  Well appearing elderly female HEENT: normal Neck: supple. JVP not elevated. Carotids 2+ bilat; no bruits.  Cor: PMI nondisplaced. Regular rate & rhythm. No rubs, gallops or murmurs. Lungs: clear Abdomen: soft, nontender, nondistended.  Extremities: no cyanosis, clubbing, rash, edema Neuro: alert & oriented x 3. Affect pleasant    REDs: 32%  ASSESSMENT & PLAN: Chronic Biventricular Heart Failure - Etiology uncertain, possible ischemic CM given known h/o CAD, though no recent CP. Remote inferior MI in 2000 w/ RCA PCI. Cath report  unavailable, uncertain if any additional disease  - Echo (2019): showed normal LVEF, 55-60%, and normal RV - drop in EF 7/22, down to 25-30% w/ mod-severely reduced RV, in the setting of severe illness from septic shock, hypoxic respiratory failure, in setting of rt lung empyema and esophageal perforation requiring emergency surgery, c/b perioperative PE and NSTEMI (medically managed in setting of severe illness) - Echo (3/23): EF 25-30%, RV moderately reduced. - Echo 06/02/22 EF 30-35% previous inferior, inferolateral MI - NYHA II-early III, functional class difficult due to deconditioning. Volume okay on exam, ReDS 32%. Continue 80 mg lasix daily.  - Fatigue likely d/t combination of heart failure and deconditioning. Will hold off on CPX as she would not be a candidate for advanced therapies. - Continue spiro 12.5 mg daily. - Continue Entresto 24/26 mg bid. - Continue Toprol XL 12.5 mg daily.  - Intolerant of SGLT2i due to UTIs (previously tried [place]). - Did not titrate meds further d/t orthostatic dizziness + advanced age  - Not ICD candidate due to advanced age. - Labs today. - Echo with follow-up  in 4 months   2. CAD  - h/o CAD, s/p remote inferior wall MI in 2000 treated w/ PCI to RCA - NST 2019 c/w prior inferior myocardial infarction. Small mild distal anterior/apical infarct with mild peri-infarct ischemia>> Low risk study.  - Admit 6/23 with CP. Lexiscan (6/23) showed EF 25%, large fixed defect involving apical and inferior myocardium consistent with old infarction.  - Patient followed by Dr. Sharyn Lull, medical management for now. - Continue ASA, statin, imdur and ? blocker.   3. PAF - Regular on exam - Continue Toprol XL.  - Continue Eliquis 2.5 mg BID (appropriate dose for age and weight), no bleeding issues.   4. H/o PE - On Eliquis.  No bleeding issues - CBC today.   5. Hypertension  - BP reasonably controlled for her age. Would not be more aggressive with management  d/t orthostatic dizziness - GDMT as above.   6. Type 2DM  - A1c 7.9 in 04/24 - Managed by PCP - On insulin. - Failed SGLT2i due to frequent UTIs    Follow up 4 months with APP with echo  Aalani Aikens N, PA-C  3:32 PM

## 2023-06-08 ENCOUNTER — Encounter: Payer: Self-pay | Admitting: Student

## 2023-06-08 ENCOUNTER — Other Ambulatory Visit: Payer: Self-pay | Admitting: Student

## 2023-06-08 DIAGNOSIS — Z1231 Encounter for screening mammogram for malignant neoplasm of breast: Secondary | ICD-10-CM

## 2023-06-08 DIAGNOSIS — E1169 Type 2 diabetes mellitus with other specified complication: Secondary | ICD-10-CM

## 2023-06-09 ENCOUNTER — Other Ambulatory Visit: Payer: Self-pay | Admitting: Gastroenterology

## 2023-06-09 ENCOUNTER — Ambulatory Visit (HOSPITAL_COMMUNITY)
Admission: RE | Admit: 2023-06-09 | Discharge: 2023-06-09 | Disposition: A | Discharge status: Home / Self Care | Payer: 59 | Source: Ambulatory Visit | Attending: Gastroenterology | Admitting: Gastroenterology

## 2023-06-09 DIAGNOSIS — K449 Diaphragmatic hernia without obstruction or gangrene: Secondary | ICD-10-CM | POA: Diagnosis not present

## 2023-06-09 DIAGNOSIS — R12 Heartburn: Secondary | ICD-10-CM

## 2023-06-09 DIAGNOSIS — R140 Abdominal distension (gaseous): Secondary | ICD-10-CM | POA: Diagnosis not present

## 2023-06-09 DIAGNOSIS — Z86010 Personal history of colonic polyps: Secondary | ICD-10-CM

## 2023-06-09 DIAGNOSIS — R1310 Dysphagia, unspecified: Secondary | ICD-10-CM

## 2023-06-09 DIAGNOSIS — K5900 Constipation, unspecified: Secondary | ICD-10-CM | POA: Diagnosis not present

## 2023-06-09 DIAGNOSIS — K223 Perforation of esophagus: Secondary | ICD-10-CM

## 2023-06-09 DIAGNOSIS — K224 Dyskinesia of esophagus: Secondary | ICD-10-CM | POA: Diagnosis not present

## 2023-06-09 DIAGNOSIS — Z860101 Personal history of adenomatous and serrated colon polyps: Secondary | ICD-10-CM

## 2023-06-09 DIAGNOSIS — R194 Change in bowel habit: Secondary | ICD-10-CM | POA: Insufficient documentation

## 2023-06-11 ENCOUNTER — Other Ambulatory Visit: Payer: Self-pay | Admitting: Student

## 2023-06-11 DIAGNOSIS — Z1231 Encounter for screening mammogram for malignant neoplasm of breast: Secondary | ICD-10-CM

## 2023-06-26 DIAGNOSIS — H52203 Unspecified astigmatism, bilateral: Secondary | ICD-10-CM | POA: Diagnosis not present

## 2023-06-26 DIAGNOSIS — Z961 Presence of intraocular lens: Secondary | ICD-10-CM | POA: Diagnosis not present

## 2023-06-26 DIAGNOSIS — H3500 Unspecified background retinopathy: Secondary | ICD-10-CM | POA: Diagnosis not present

## 2023-06-28 ENCOUNTER — Other Ambulatory Visit (HOSPITAL_COMMUNITY): Payer: Self-pay | Admitting: Internal Medicine

## 2023-07-03 DIAGNOSIS — I25118 Atherosclerotic heart disease of native coronary artery with other forms of angina pectoris: Secondary | ICD-10-CM | POA: Diagnosis not present

## 2023-07-03 DIAGNOSIS — I10 Essential (primary) hypertension: Secondary | ICD-10-CM | POA: Diagnosis not present

## 2023-07-03 DIAGNOSIS — E119 Type 2 diabetes mellitus without complications: Secondary | ICD-10-CM | POA: Diagnosis not present

## 2023-07-03 DIAGNOSIS — I5022 Chronic systolic (congestive) heart failure: Secondary | ICD-10-CM | POA: Diagnosis not present

## 2023-07-04 ENCOUNTER — Other Ambulatory Visit: Payer: Self-pay | Admitting: Student

## 2023-07-04 DIAGNOSIS — E1169 Type 2 diabetes mellitus with other specified complication: Secondary | ICD-10-CM

## 2023-07-06 DIAGNOSIS — D23122 Other benign neoplasm of skin of left lower eyelid, including canthus: Secondary | ICD-10-CM | POA: Diagnosis not present

## 2023-07-06 DIAGNOSIS — D23111 Other benign neoplasm of skin of right upper eyelid, including canthus: Secondary | ICD-10-CM | POA: Diagnosis not present

## 2023-07-06 DIAGNOSIS — D23112 Other benign neoplasm of skin of right lower eyelid, including canthus: Secondary | ICD-10-CM | POA: Diagnosis not present

## 2023-07-08 DIAGNOSIS — H5213 Myopia, bilateral: Secondary | ICD-10-CM | POA: Diagnosis not present

## 2023-07-15 ENCOUNTER — Ambulatory Visit (INDEPENDENT_AMBULATORY_CARE_PROVIDER_SITE_OTHER): Payer: 59 | Admitting: Gastroenterology

## 2023-07-15 ENCOUNTER — Encounter: Payer: Self-pay | Admitting: Gastroenterology

## 2023-07-15 VITALS — BP 130/70 | HR 64 | Ht 60.75 in | Wt 125.0 lb

## 2023-07-15 DIAGNOSIS — R933 Abnormal findings on diagnostic imaging of other parts of digestive tract: Secondary | ICD-10-CM | POA: Diagnosis not present

## 2023-07-15 DIAGNOSIS — R1319 Other dysphagia: Secondary | ICD-10-CM

## 2023-07-15 DIAGNOSIS — R194 Change in bowel habit: Secondary | ICD-10-CM | POA: Diagnosis not present

## 2023-07-15 DIAGNOSIS — R1310 Dysphagia, unspecified: Secondary | ICD-10-CM

## 2023-07-15 NOTE — Progress Notes (Unsigned)
GASTROENTEROLOGY OUTPATIENT CLINIC VISIT   Primary Care Provider Glendale Chard, DO [address] [place] [address] [phone]  Patient Profile: Dana Coleman is a 82 y.o. female with a pmh significant for CHF (EF 30-35%), atrial fibrillation, hypertension, hyperlipidemia, diabetes, prior breast cancer, prior cervical cancer, status postcholecystectomy, previous esophageal perforation status post surgical treatment, GERD, colon polyps (TA's).  The patient presents to the [facility] for an evaluation and management of problem(s) noted below:  Problem List No diagnosis found.   History of Present Illness This is the patient's first visit to the outpatient  GI clinic since her colonoscopy back in 2021.  In 2022 she underwent a cholecystectomy but later was found to have esophageal complication leading to surgical retreatment.  She eventually required a G-tube for further nutrition and over time improved to the point where her G-tube was removed.  She presents today to discuss whether she would like to continue colon polyp surveillance and this for [place] other symptoms that she is experiencing.  She has dealt with constipation for years.  She will go days at a time without a bowel movement she will occasionally take a laxative therapy to help her move her bowels.  She does at times have more gas and bloating and after relieving herself of a bowel movement she will have improvement in her symptoms.  In the evening the patient wakes up with a "bubbling sensation" in her upper abdomen.  This lasted for a few minutes but will relieve itself time.  She does have acid reflux but feels that her symptoms are relatively controlled.  Since her esophageal perforation and surgery after, she has improved in regard to symptoms of dysphagia that were occurring around that time.  She does not feel she has a severe dysphagia symptoms.  At times she will have some mild regurgitation  but she does not vomit regularly.  She is not experiencing significant abdominal pain currently.   GI Review of Systems Positive as above Negative for odynophagia, melena, hematochezia  Review of Systems General: Denies fevers/chills/weight loss unintentionally Cardiovascular: Denies chest pain Pulmonary: Denies shortness of breath Gastroenterological: See HPI Genitourinary: Denies darkened urine Hematological: Denies easy bruising/bleeding Endocrine: Denies temperature intolerance Dermatological: Denies jaundice Psychological: Mood is stable   Medications Current Outpatient Medications  Medication Sig Dispense Refill   acetaminophen (TYLENOL) 500 MG tablet Take 500 mg by mouth every 6 (six) hours as needed for moderate pain.     apixaban (ELIQUIS) 2.5 MG TABS tablet Take 1 tablet (2.5 mg total) by mouth 2 (two) times daily. 60 tablet 8   ascorbic acid (VITAMIN C) 500 MG tablet Take 500 mg by mouth daily.     aspirin EC 81 MG tablet Take 81 mg by mouth in the morning. Swallow whole.     bisacodyl (DULCOLAX) 5 MG EC tablet Take 5 mg by mouth every other day.     Blood Glucose Monitoring Suppl MISC daily.     Dulaglutide (TRULICITY) 1.5 MG/0.5ML SOPN Inject 1.5 mg into the skin once a week. 2 mL 5   ENTRESTO 24-26 MG TAKE 1 TABLET BY MOUTH TWICE A DAY 60 tablet 2   erythromycin ophthalmic ointment Place 1 Application into both eyes 4 (four) times daily.     fluticasone (FLONASE) 50 MCG/ACT nasal spray Place 1 spray into both nostrils daily as needed for allergies or rhinitis. 1 spray in each nostril every day 16 g 12   furosemide (LASIX) 80 MG  tablet Take 1 tablet (80 mg total) by mouth daily. 30 tablet 11   glucose blood (ONE TOUCH ULTRA TEST) test strip CHECK BLOOD SUGAR TWICE DAILY 100 each 1   insulin glargine (LANTUS SOLOSTAR) 100 UNIT/ML Solostar Pen INJECT 10 UNITS INTO THE SKIN DAILY 15 mL 3   Insulin Pen Needle (B-D UF III MINI PEN NEEDLES) 31G X 5 MM MISC INJECT INSULIN VIA  PEN 6 TIMES DAILY 200 each 3   isosorbide mononitrate (IMDUR) 30 MG 24 hr tablet TAKE 1 TABLET BY MOUTH EVERY DAY 90 tablet 3   Lancets (ONETOUCH DELICA PLUS LANCET33G) MISC APPLY ROUTE AS NEEDED. 100 each PRN   linaclotide (LINZESS) 72 MCG capsule Take 72 mcg by mouth daily before breakfast.     Melatonin 3 MG TBDP Take 6 mg by mouth at bedtime as needed (sleep).     metoprolol succinate (TOPROL-XL) 25 MG 24 hr tablet TAKE 1/2 TABLET BY MOUTH DAILY 45 tablet 3   omeprazole (PRILOSEC) 20 MG capsule Take 40 mg by mouth every morning.     polycarbophil (FIBERCON) 625 MG tablet Take 625 mg by mouth daily.     rosuvastatin (CRESTOR) 20 MG tablet TAKE 1 TABLET BY MOUTH EVERYDAY AT BEDTIME 90 tablet 1   spironolactone (ALDACTONE) 25 MG tablet TAKE 1/2 TABLET BY MOUTH EVERY DAY 45 tablet 3   traZODone (DESYREL) 50 MG tablet TAKE 1 TABLET BY MOUTH EVERY DAY AT BEDTIME AS NEEDED FOR SLEEP 90 tablet 1   nitroGLYCERIN (NITROSTAT) 0.4 MG SL tablet Place 0.4 mg under the tongue every 5 (five) minutes as needed for chest pain. (Patient not taking: Reported on 06/04/2023)     No current facility-administered medications for this visit.    Allergies No Known Allergies  Histories Past Medical History:  Diagnosis Date   Acute pulmonary edema (HCC)    Acute respiratory failure with hypoxemia (HCC)    Acute respiratory failure with hypoxia (HCC)    Allergy    occ uses OTC allergy meds    Breast cancer (HCC) 04/2008   Right breast   Cataracts, bilateral    removed bilat    Cervical cancer (HCC)    When the patient was in her 30s   Depression    Empyema (HCC)    Esophageal perforation 05/05/2021   Failure to thrive in adult 07/22/2021   Wt Readings from Last 3 Encounters: 06/26/21 65.8 kg 06/17/21 64.8 kg 04/26/21 58.1 kg     Gastrostomy tube in place [facility]) 08/21/2021   Heart attack (HCC) 2004   History of cervical cancer    S/p radiation and surgical removal in 30's.  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 Donnie Coffin   PEG (percutaneous endoscopic gastrostomy) status (HCC) 07/23/2021   Personal history of radiation therapy 2009   Pressure injury of skin 05/05/2021   Protein-calorie malnutrition, severe (HCC) 05/15/2021   Pyelonephritis 11/06/2021   S/P thoracotomy    Past Surgical History:  Procedure Laterality Date   BREAST LUMPECTOMY Right 2009   BREAST SURGERY  2009   right lumpectomy   CERVIX SURGERY  1980   CHOLECYSTECTOMY N/A 05/02/2021   Procedure: LAPAROSCOPIC CHOLECYSTECTOMY;  Surgeon: Gaynelle Adu, MD;  Location:  [facility] ORS;  Service: General;  Laterality: N/A;   COLONOSCOPY     IR GASTROSTOMY TUBE MOD SED  06/03/2021   IR GASTROSTOMY TUBE REMOVAL  10/08/2021   LEFT HEART CATHETERIZATION WITH CORONARY ANGIOGRAM N/A 04/20/2014   Procedure: LEFT HEART CATHETERIZATION WITH CORONARY ANGIOGRAM;  Surgeon: Othella Boyer, MD;  Location[facility] CATH LAB;  Service: Cardiovascular;  Laterality: N/A;   POLYPECTOMY     STENT PLACEMENT VASCULAR ([facility] HX)     THORACOTOMY Right 05/05/2021   Procedure: THORACOTOMY MAJOR REPAIR PERFORATED ESOPHAGUS;  Surgeon: Alleen Borne, MD;  Location: [facility] OR;  Service: Thoracic;  Laterality: Right;   thyroid     for nodule hemithyroidectomy, benign   THYROIDECTOMY Right 03/2003   Social History   Socioeconomic History   Marital status: Divorced    Spouse name: Not on file   Number of children: 5   Years of education: 10   Highest education level: 10th grade  Occupational History   Occupation: Retired- Metallurgist  Tobacco Use   Smoking status: Never   Smokeless tobacco: Never   Tobacco comments:    No plans to start  Vaping Use   Vaping status: Never Used  Substance and Sexual  Activity   Alcohol use: No   Drug use: No   Sexual activity: Not Currently    Birth control/protection: Condom  Other Topics Concern   Not on file  Social History Narrative   Patient lives alone in [place].   Patient is close with daughters Sunny Schlein and [place]. One Son in [place]. 2 children have passed.    Patient depends on her sister and brother for support and any assistance needed.    Talbert Forest (sister) [phone].   Religious / Personal Beliefs: Baptist   Patient does not drive.   Diet: Pt has a varied diet of protein, starch, and vegetables and fruits.   Seatbelts: Pt reports wearing seatbelt when in vehicles.    Hobbies: shopping, visiting family, church   Education / Work:  [employer]                                                              Social Determinants of Health   Financial Resource Strain: Low Risk  (04/17/2023)   Overall Financial Resource Strain (CARDIA)    Difficulty of Paying Living Expenses: Not hard at all  Food Insecurity: No Food Insecurity (04/17/2023)   Hunger Vital Sign    Worried About Running Out of Food in the Last Year: Never true    Ran Out of Food in the Last Year: Never true  Transportation Needs: No Transportation Needs (04/17/2023)   PRAPARE - Administrator, Civil Service (Medical): No    Lack of Transportation (Non-Medical): No  Physical Activity: Insufficiently Active (04/17/2023)   Exercise Vital Sign    Days of Exercise per Week: 3 days    Minutes of Exercise per Session: 30 min  Stress: No Stress Concern Present (04/17/2023)   [employer] of Occupational Health - Occupational Stress Questionnaire    Feeling of Stress : Only a little  Recent Concern: Stress - Stress Concern Present (01/20/2023)   [employer] of Occupational Health - Occupational Stress Questionnaire    Feeling of Stress :  To some extent  Social Connections: Moderately Isolated (04/17/2023)   Social Connection and Isolation Panel  [NHANES]    Frequency of Communication with Friends and Family: More than three times a week    Frequency of Social Gatherings with Friends and Family: Three times a week    Attends Religious Services: More than 4 times per year    Active Member of Clubs or Organizations: No    Attends Banker Meetings: Never    Marital Status: Divorced  Catering manager Violence: Not At Risk (04/17/2023)   Humiliation, Afraid, Rape, and Kick questionnaire    Fear of Current or Ex-Partner: No    Emotionally Abused: No    Physically Abused: No    Sexually Abused: No   Family History  Problem Relation Age of Onset   Heart disease Mother        age 56   Heart disease Father    Cancer Father        unknown   Heart disease Brother    Hypertension Brother    Cirrhosis Brother    Diabetes Daughter    Stroke Daughter    Kidney disease Daughter    Hypertension Son    Colon cancer Neg Hx    Colon polyps Neg Hx    Esophageal cancer Neg Hx    Stomach cancer Neg Hx    Rectal cancer Neg Hx    Inflammatory bowel disease Neg Hx    Liver disease Neg Hx    Pancreatic cancer Neg Hx    I have reviewed her medical, social, and family history in detail and updated the electronic medical record as necessary.    PHYSICAL EXAMINATION  BP 130/70 (BP Location: Left Arm, Patient Position: Sitting, Cuff Size: Normal)   Pulse 64 Comment: irregular  Ht 5' 0.75" (1.543 m) Comment: height measured without shoes  Wt 125 lb (56.7 kg)   BMI 23.81 kg/m  Wt Readings from Last 3 Encounters:  07/15/23 125 lb (56.7 kg)  06/04/23 125 lb 12.8 oz (57.1 kg)  05/19/23 125 lb (56.7 kg)  GEN: NAD, appears stated age, doesn't appear chronically ill PSYCH: Cooperative, without pressured speech EYE: Conjunctivae pink, sclerae anicteric ENT: MMM CV: Nontachycardic RESP: No audible wheezing GI: NABS, soft, NT/ND, surgical scars present, without rebound or guarding MSK/EXT: No significant lower extremity edema SKIN:  No jaundice NEURO:  Alert & Oriented x 3, no focal deficits   REVIEW OF DATA  I reviewed the following data at the time of this encounter:  GI Procedures and Studies  2021 colonoscopy - Perianal skin tags found on perianal exam. - Hemorrhoids found on digital rectal exam. - Three 2 to 4 mm polyps at the recto-sigmoid colon and in the cecum, removed with a cold snare. Resected and retrieved. - Normal mucosa in the entire examined colon. - Diverticulosis in the recto-sigmoid colon, in the sigmoid colon, in the descending colon, in the transverse colon and in the ascending colon. - Non-bleeding non-thrombosed external and internal hemorrhoids.  Pathology Diagnosis Surgical [P], colon, cecum and rectosigmoid, polyp (3) - TUBULAR ADENOMA(S) - NEGATIVE FOR HIGH-GRADE DYSPLASIA OR MALIGNANCY  Laboratory Studies  Reviewed those in epic  Imaging Studies  October 2022 CT abdomen pelvis with contrast IMPRESSION: 1. No acute CT findings of the abdomen or pelvis. No evidence of bowel obstruction. 2. Percutaneous gastrostomy tube.   ASSESSMENT  Ms. Spoon is a 82 y.o. female with a pmh significant for CHF (EF 30-35%), atrial fibrillation,  hypertension, hyperlipidemia, diabetes, prior breast cancer, prior cervical cancer, status postcholecystectomy, previous esophageal perforation status post surgical treatment, GERD, colon polyps (TA's).  The patient is seen today for evaluation and management of:  No diagnosis found.  The patient is hemodynamically and clinically stable at this time.  Patient's overall improved from where she was in 2022 in 2023 after a pretty significant cholecystectomy and esophageal perforation.  It seems like she has not regained significant improvement in regards to her swallowing though at times will still experience pyrosis and some very mild regurgitation.  Will going to begin by pursuing a barium swallow to get a better sense of how the esophagus looks.  Based on how  the barium swallow looks we will consider the role of an upper endoscopy.  Based on the patient's low EF, I think we want to be mindful about considering repeat colonoscopy.  She does look well and I think she probably would tolerate preparation and anesthesia, but she is not sure if she wants to move forward with that and I am okay with holding on scheduling the colonoscopy.  Will see her back a couple of months and make a final decision as to whether we will stop colon polyp surveillance.  Will check her thyroid function regards to her chronic constipation and give her some recommendations in regards to a bowel regimen and fiber supplementation.  All patient questions were answered to the best of my ability, and the patient agrees to the aforementioned plan of action with follow-up as indicated.   PLAN  Proceed with barium swallow Continue PPI 40 mg daily (take before a meal) Toileting instructions discussed with patient Initiate FiberCon once to twice daily Initiate MiraLAX daily to every other day use Dulcolax every other day as needed in an effort of having at least 3 bowel movements per week Samples of Linzess 72 mcg have been given to patient trial if a 2 to 3-week course of MiraLAX +/- Dulcolax is not helpful We will follow-up in clinic in a few months to decide upon any further endoscopic evaluation being pursued from above or below based on patient's other medical comorbidities   No orders of the defined types were placed in this encounter.   New Prescriptions   No medications on file   Modified Medications   No medications on file    Planned Follow Up No follow-ups on file.   Total Time in Face-to-Face and in Coordination of Care for patient including independent/personal interpretation/review of prior testing, medical history, examination, medication adjustment, communicating results with the patient directly, and documentation within the EHR is 25 minutes.   Corliss Parish, MD [facility] Office # [phone]

## 2023-07-15 NOTE — Patient Instructions (Signed)
You have been scheduled for an endoscopy. Please follow written instructions given to you at your visit today.  If you use inhalers (even only as needed), please bring them with you on the day of your procedure.  If you take any of the following medications, they will need to be adjusted prior to your procedure:   DO NOT TAKE 7 DAYS PRIOR TO TEST- Trulicity (dulaglutide) Ozempic, Wegovy (semaglutide) Mounjaro (tirzepatide) Bydureon Bcise (exanatide extended release)  DO NOT TAKE 1 DAY PRIOR TO YOUR TEST Rybelsus (semaglutide) Adlyxin (lixisenatide) Victoza (liraglutide) Byetta (exanatide) ___________________________________________________________________________    Due to recent changes in healthcare laws, you may see the results of your imaging and laboratory studies on MyChart before your provider has had a chance to review them.  We understand that in some cases there may be results that are confusing or concerning to you. Not all laboratory results come back in the same time frame and the provider may be waiting for multiple results in order to interpret others.  Please give [place] 48 hours in order for your provider to thoroughly review all the results before contacting the office for clarification of your results.   _______________________________________________________  If your blood pressure at your visit was 140/90 or greater, please contact your primary care physician to follow up on this.  _______________________________________________________  If you are age 6 or older, your body mass index should be between 23-30. Your Body mass index is 23.81 kg/m. If this is out of the aforementioned range listed, please consider follow up with your Primary Care Provider.  If you are age 11 or younger, your body mass index should be between 19-25. Your Body mass index is 23.81 kg/m. If this is out of the aformentioned range listed, please consider follow up with your Primary Care  Provider.   ________________________________________________________  The [facility] providers would like to encourage you to use [facility] to communicate with providers for non-urgent requests or questions.  Due to long hold times on the telephone, sending your provider a message by [facility] may be a faster and more efficient way to get a response.  Please allow 48 business hours for a response.  Please remember that this is for non-urgent requests.  _______________________________________________________  Thank you for choosing me and [facility].  Dr. Meridee Score

## 2023-07-16 ENCOUNTER — Ambulatory Visit
Admission: RE | Admit: 2023-07-16 | Discharge: 2023-07-16 | Disposition: A | Discharge status: Home / Self Care | Payer: 59 | Source: Ambulatory Visit | Attending: Family Medicine | Admitting: Family Medicine

## 2023-07-16 ENCOUNTER — Encounter: Payer: Self-pay | Admitting: Gastroenterology

## 2023-07-16 ENCOUNTER — Telehealth: Payer: Self-pay

## 2023-07-16 DIAGNOSIS — Z1231 Encounter for screening mammogram for malignant neoplasm of breast: Secondary | ICD-10-CM

## 2023-07-16 DIAGNOSIS — R933 Abnormal findings on diagnostic imaging of other parts of digestive tract: Secondary | ICD-10-CM | POA: Insufficient documentation

## 2023-07-16 NOTE — Telephone Encounter (Signed)
Request for surgical clearance:     Endoscopy Procedure  What type of surgery is being performed?     EGD   When is this surgery scheduled?     09/21/23  What type of clearance is required ?   Pharmacy  Are there any medications that need to be held prior to surgery and how long? Eliquis x2 days   Practice name and name of physician performing surgery?      [facility]  What is your office phone and fax number?      Phone- [phone]  Fax- [phone]  Anesthesia type (None, local, MAC, general) ?       MAC

## 2023-07-20 ENCOUNTER — Other Ambulatory Visit: Payer: Self-pay | Admitting: Student

## 2023-07-20 ENCOUNTER — Telehealth: Payer: Self-pay

## 2023-07-20 DIAGNOSIS — N6310 Unspecified lump in the right breast, unspecified quadrant: Secondary | ICD-10-CM

## 2023-07-20 NOTE — Telephone Encounter (Signed)
Request for surgical clearance:     Endoscopy Procedure  What type of surgery is being performed?     EGD   When is this surgery scheduled?     09/21/23  What type of clearance is required ?   Pharmacy  Are there any medications that need to be held prior to surgery and how long? Eliquis x2 days   Practice name and name of physician performing surgery?      [facility]  What is your office phone and fax number?      Phone- [phone]  Fax- [phone]  Anesthesia type (None, local, MAC, general) ?       MAC

## 2023-07-20 NOTE — Telephone Encounter (Signed)
Pharmacy please advise on holding Eliquis (dx of PE) prior to EGD scheduled for 09/21/2023. Thank you.

## 2023-07-20 NOTE — Telephone Encounter (Signed)
Patient with diagnosis of afib and PE 05/2021 on Eliquis for anticoagulation.    Procedure: EGD Date of procedure: 09/21/23  CHA2DS2-VASc Score = 7  This indicates a 11.2% annual risk of stroke. The patient's score is based upon: CHF History: 1 HTN History: 1 Diabetes History: 1 Stroke History: 0 Vascular Disease History: 1 Age Score: 2 Gender Score: 1   CrCl 69mL/min Platelet count 139K  Per office protocol, patient can hold Eliquis for 2 days prior to procedure as requested.    **This guidance is not considered finalized until pre-operative APP has relayed final recommendations.**

## 2023-07-20 NOTE — Telephone Encounter (Signed)
Name: Dana Coleman  DOB: 11/11/1940  MRN: [id number]  Primary Cardiologist: Dr.Bensimhon    Preoperative team, please contact this patient and set up a phone call appointment for further preoperative risk assessment. Please obtain consent and complete medication review. Thank you for your help.Last seen by Prince Rome on 12/01/2022.   I confirm that guidance regarding antiplatelet and oral anticoagulation therapy has been completed and, if necessary, noted below.  Per office protocol, patient can hold Eliquis for 2 days prior to procedure as requested.    I also confirmed the patient resides in the state of [place]. As per [facility] telemedicine laws, the patient must reside in the state in which the provider is licensed.   Joni Reining, NP 07/20/2023, 11:40 AM [facility]

## 2023-07-22 ENCOUNTER — Telehealth: Payer: Self-pay

## 2023-07-22 NOTE — Telephone Encounter (Signed)
Preopp televisit has now been scheduled and consent provided from patient. Patient agrees to appt date and time.

## 2023-07-22 NOTE — Telephone Encounter (Signed)
  Patient Consent for Virtual Visit        Dana Coleman has provided verbal consent on 07/22/2023 for a virtual visit (video or telephone).   CONSENT FOR VIRTUAL VISIT FOR:  Dana Coleman  By participating in this virtual visit I agree to the following:  I hereby voluntarily request, consent and authorize [facility] and its employed or contracted physicians, physician assistants, nurse practitioners or other licensed health care professionals (the Practitioner), to provide me with telemedicine health care services (the "Services") as deemed necessary by the treating Practitioner. I acknowledge and consent to receive the Services by the Practitioner via telemedicine. I understand that the telemedicine visit will involve communicating with the Practitioner through live audiovisual communication technology and the disclosure of certain medical information by electronic transmission. I acknowledge that I have been given the opportunity to request an in-person assessment or other available alternative prior to the telemedicine visit and am voluntarily participating in the telemedicine visit.  I understand that I have the right to withhold or withdraw my consent to the use of telemedicine in the course of my care at any time, without affecting my right to future care or treatment, and that the Practitioner or I may terminate the telemedicine visit at any time. I understand that I have the right to inspect all information obtained and/or recorded in the course of the telemedicine visit and may receive copies of available information for a reasonable fee.  I understand that some of the potential risks of receiving the Services via telemedicine include:  Delay or interruption in medical evaluation due to technological equipment failure or disruption; Information transmitted may not be sufficient (e.g. poor resolution of images) to allow for appropriate medical decision making by the  Practitioner; and/or  In rare instances, security protocols could fail, causing a breach of personal health information.  Furthermore, I acknowledge that it is my responsibility to provide information about my medical history, conditions and care that is complete and accurate to the best of my ability. I acknowledge that Practitioner's advice, recommendations, and/or decision may be based on factors not within their control, such as incomplete or inaccurate data provided by me or distortions of diagnostic images or specimens that may result from electronic transmissions. I understand that the practice of medicine is not an exact science and that Practitioner makes no warranties or guarantees regarding treatment outcomes. I acknowledge that a copy of this consent can be made available to me via my patient portal [facility] MyChart), or I can request a printed copy by calling the office of [facility].    I understand that my insurance will be billed for this visit.   I have read or had this consent read to me. I understand the contents of this consent, which adequately explains the benefits and risks of the Services being provided via telemedicine.  I have been provided ample opportunity to ask questions regarding this consent and the Services and have had my questions answered to my satisfaction. I give my informed consent for the services to be provided through the use of telemedicine in my medical care

## 2023-07-29 LAB — HM DIABETES EYE EXAM

## 2023-07-30 ENCOUNTER — Ambulatory Visit
Admission: RE | Admit: 2023-07-30 | Discharge: 2023-07-30 | Disposition: A | Discharge status: Home / Self Care | Payer: 59 | Source: Ambulatory Visit | Attending: Family Medicine | Admitting: Family Medicine

## 2023-07-30 ENCOUNTER — Encounter (HOSPITAL_COMMUNITY): Payer: Self-pay | Admitting: Student

## 2023-07-30 DIAGNOSIS — N644 Mastodynia: Secondary | ICD-10-CM | POA: Diagnosis not present

## 2023-07-30 DIAGNOSIS — N6310 Unspecified lump in the right breast, unspecified quadrant: Secondary | ICD-10-CM

## 2023-08-05 ENCOUNTER — Other Ambulatory Visit: Payer: Self-pay | Admitting: Student

## 2023-08-06 DIAGNOSIS — H524 Presbyopia: Secondary | ICD-10-CM | POA: Diagnosis not present

## 2023-08-11 ENCOUNTER — Ambulatory Visit: Payer: 59 | Attending: Nurse Practitioner

## 2023-08-11 DIAGNOSIS — Z01810 Encounter for preprocedural cardiovascular examination: Secondary | ICD-10-CM | POA: Diagnosis not present

## 2023-08-11 NOTE — Progress Notes (Signed)
Virtual Visit via Telephone Note   Because of Dana Coleman's co-morbid illnesses, she is at least at moderate risk for complications without adequate follow up.  This format is felt to be most appropriate for this patient at this time.  The patient did not have access to video technology/had technical difficulties with video requiring transitioning to audio format only (telephone).  All issues noted in this document were discussed and addressed.  No physical exam could be performed with this format.  Please refer to the patient's chart for her consent to telehealth for [facility].  Evaluation Performed:  Preoperative cardiovascular risk assessment _____________   Date:  08/11/2023   Patient ID:  Dana Coleman, Dana Coleman 1940/12/20, MRN [id number] Patient Location:  Home Provider location:   Office  Primary Care Provider:  Glendale Chard, DO Primary Cardiologist:  None  Chief Complaint / Patient Profile   82 y.o. y/o female with a h/o CAD, s/p remote inferior wall MI in 2000 treated w/ PCI to RCA, chronic biventricular CHF, PAF, hypertension, type 2DM, HLD and h/o right sided breast cancer treated w/ radiation and mastectomy, no chemotherapy  who is pending endoscopy and presents today for telephonic preoperative cardiovascular risk assessment.  History of Present Illness    Dana Coleman is a 82 y.o. female who presents via audio/video conferencing for a telehealth visit today.  Pt was last seen in cardiology clinic on 12/01/2022 by Prince Rome, FNP.  At that time Dana Melito was doing well and there was shortness of breath with walking long distances.Endorses no chest pain or angina during follow-up.  The patient is now pending procedure as outlined above. Since her last visit, she has been doing well with no new cardiac complaints.  She still endorses shortness of breath with activity but denies any chest pain.  She denies chest pain, shortness of breath,  lower extremity edema, fatigue, palpitations, melena, hematuria, hemoptysis, diaphoresis, weakness, presyncope, syncope, orthopnea, and PND.   Past Medical History    Past Medical History:  Diagnosis Date   Acute pulmonary edema (HCC)    Acute respiratory failure with hypoxemia (HCC)    Acute respiratory failure with hypoxia (HCC)    Allergy    occ uses OTC allergy meds    Breast cancer (HCC) 04/2008   Right breast   Cataracts, bilateral    removed bilat    Cervical cancer (HCC)    When the patient was in her 30s   Depression    Empyema (HCC)    Esophageal perforation 05/05/2021   Failure to thrive in adult 07/22/2021   Wt Readings from Last 3 Encounters: 06/26/21 65.8 kg 06/17/21 64.8 kg 04/26/21 58.1 kg     Gastrostomy tube in place [facility]) 08/21/2021   Heart attack (HCC) 2004   History of cervical cancer    S/p radiation and surgical removal in 30's.  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 Donnie Coffin   PEG (percutaneous endoscopic gastrostomy) status (HCC) 07/23/2021   Personal history of radiation therapy 2009   Pressure injury of skin 05/05/2021   Protein-calorie malnutrition, severe (HCC) 05/15/2021   Pyelonephritis 11/06/2021   S/P thoracotomy    Past Surgical History:  Procedure Laterality Date   BREAST LUMPECTOMY Right 2009   BREAST SURGERY  2009   right lumpectomy   CERVIX SURGERY  1980   CHOLECYSTECTOMY N/A 05/02/2021   Procedure: LAPAROSCOPIC CHOLECYSTECTOMY;  Surgeon: Gaynelle Adu, MD;  Location: [facility] ORS;  Service: General;  Laterality: N/A;   COLONOSCOPY     IR GASTROSTOMY TUBE MOD SED  06/03/2021   IR GASTROSTOMY TUBE REMOVAL  10/08/2021   LEFT HEART CATHETERIZATION WITH CORONARY ANGIOGRAM N/A 04/20/2014   Procedure:  LEFT HEART CATHETERIZATION WITH CORONARY ANGIOGRAM;  Surgeon: Othella Boyer, MD;  Location: [facility] CATH LAB;  Service: Cardiovascular;  Laterality: N/A;   POLYPECTOMY     STENT PLACEMENT VASCULAR ([facility] HX)     THORACOTOMY Right 05/05/2021   Procedure: THORACOTOMY MAJOR REPAIR PERFORATED ESOPHAGUS;  Surgeon: Alleen Borne, MD;  Location: [facility] OR;  Service: Thoracic;  Laterality: Right;   thyroid     for nodule hemithyroidectomy, benign   THYROIDECTOMY Right 03/2003    Allergies  No Known Allergies  Home Medications    Prior to Admission medications   Medication Sig Start Date End Date Taking? Authorizing Provider  acetaminophen (TYLENOL) 500 MG tablet Take 500 mg by mouth every 6 (six) hours as needed for moderate pain.    [provider]  apixaban (ELIQUIS) 2.5 MG TABS tablet Take 1 tablet (2.5 mg total) by mouth 2 (two) times daily. 12/01/22   Milford, [name], FNP  ascorbic acid (VITAMIN C) 500 MG tablet Take 500 mg by mouth daily.    [provider]  aspirin EC 81 MG tablet Take 81 mg by mouth in the morning. Swallow whole.    [provider]  bisacodyl (DULCOLAX) 5 MG EC tablet Take 5 mg by mouth every other day.    [provider]  Blood Glucose Monitoring Suppl MISC daily.    [provider]  Dulaglutide (TRULICITY) 1.5 MG/0.5ML SOPN Inject 1.5 mg into the skin once a week. 04/27/23   Glendale Chard, DO  ENTRESTO 24-26 MG TAKE 1 TABLET BY MOUTH TWICE A DAY 08/06/23   Glendale Chard, DO  erythromycin ophthalmic ointment Place 1 Application into both eyes 4 (four) times daily. 07/06/23   [provider]  fluticasone (FLONASE) 50 MCG/ACT nasal spray Place 1 spray into both nostrils daily as needed for allergies or rhinitis. 1 spray in each nostril every day 09/09/22   Littie Deeds, MD  furosemide (LASIX) 80 MG tablet Take 1 tablet (80 mg total) by mouth daily. 01/05/23   Clegg, Amy D, NP  insulin glargine (LANTUS SOLOSTAR) 100 UNIT/ML  Solostar Pen INJECT 10 UNITS INTO THE SKIN DAILY 06/08/23   Glendale Chard, DO  Insulin Pen Needle (B-D UF III MINI PEN NEEDLES) 31G X 5 MM MISC INJECT INSULIN VIA PEN 6 TIMES DAILY 02/22/18   Howard Pouch, MD  isosorbide mononitrate (IMDUR) 30 MG 24 hr tablet TAKE 1 TABLET BY MOUTH EVERY DAY 06/29/23   Bensimhon, Bevelyn Buckles, MD  Lancets [facility] DELICA PLUS Pope) MISC APPLY ROUTE AS NEEDED. 10/03/22   Glendale Chard, DO  linaclotide (LINZESS) 72 MCG capsule Take 72 mcg by mouth daily before breakfast.    [provider]  Melatonin 3 MG TBDP Take 6 mg by mouth at bedtime as needed (sleep). 07/20/21   [provider]  metoprolol succinate (TOPROL-XL) 25 MG 24 hr tablet TAKE 1/2 TABLET BY MOUTH DAILY 03/16/23   Bensimhon, Bevelyn Buckles, MD  nitroGLYCERIN (NITROSTAT) 0.4 MG SL tablet  Place 0.4 mg under the tongue every 5 (five) minutes as needed for chest pain.    [provider]  omeprazole (PRILOSEC) 20 MG capsule Take 40 mg by mouth every morning. 12/12/20   [provider]  ONETOUCH ULTRA test strip CHECK BLOOD SUGAR TWICE DAILY 08/06/23   Glendale Chard, DO  polycarbophil (FIBERCON) 625 MG tablet Take 625 mg by mouth daily.    [provider]  rosuvastatin (CRESTOR) 20 MG tablet TAKE 1 TABLET BY MOUTH EVERYDAY AT BEDTIME 04/14/23   Glendale Chard, DO  spironolactone (ALDACTONE) 25 MG tablet TAKE 1/2 TABLET BY MOUTH EVERY DAY 12/02/22   Jacklynn Ganong, FNP  traZODone (DESYREL) 50 MG tablet TAKE 1 TABLET BY MOUTH EVERY DAY AT BEDTIME AS NEEDED FOR SLEEP 07/06/23   Glendale Chard, DO    Physical Exam    Vital Signs:  Mollye W Conyer does not have vital signs available for review today.  Given telephonic nature of communication, physical exam is limited. AAOx3. NAD. Normal affect.  Speech and respirations are unlabored.  Accessory Clinical Findings    None  Assessment & Plan    1.  Preoperative Cardiovascular Risk Assessment: -Patient's RCRI score is  11%  The patient affirms she has been doing well without any new cardiac symptoms. They are able to achieve 5 METS without cardiac limitations. Therefore, based on ACC/AHA guidelines, the patient would be at acceptable risk for the planned procedure without further cardiovascular testing. The patient was advised that if she develops new symptoms prior to surgery to contact our office to arrange for a follow-up visit, and she verbalized understanding.   The patient was advised that if she develops new symptoms prior to surgery to contact our office to arrange for a follow-up visit, and she verbalized understanding.  Per office protocol, patient can hold Eliquis for 2 days prior to procedure as requested.   A copy of this note will be routed to requesting surgeon.  Time:   Today, I have spent 7 minutes with the patient with telehealth technology discussing medical history, symptoms, and management plan.     Napoleon Form, Leodis Rains, NP  08/11/2023, 7:30 AM

## 2023-08-26 ENCOUNTER — Ambulatory Visit (INDEPENDENT_AMBULATORY_CARE_PROVIDER_SITE_OTHER): Payer: 59 | Admitting: Student

## 2023-08-26 ENCOUNTER — Encounter: Payer: Self-pay | Admitting: Student

## 2023-08-26 VITALS — BP 146/71 | HR 71 | Ht 60.0 in | Wt 125.2 lb

## 2023-08-26 DIAGNOSIS — E1169 Type 2 diabetes mellitus with other specified complication: Secondary | ICD-10-CM | POA: Diagnosis not present

## 2023-08-26 LAB — POCT GLYCOSYLATED HEMOGLOBIN (HGB A1C): HbA1c, POC (controlled diabetic range): 6.4 % (ref 0.0–7.0)

## 2023-08-26 NOTE — Assessment & Plan Note (Addendum)
Patient comes in for follow-up of her diabetes.  Diabetes well-controlled, with A1c of 6.3.  Patient reports good compliance with medications.  Patient's morning CBG checks, usually around 110, couple sugars in the 80s to 90s range, will recommend decreasing insulin dose, as A1c is almost too well-controlled, with goal less than 8. - Lantus 5 units daily - Trulicity 1.5 mg weekly - Follow-up 3 months

## 2023-08-26 NOTE — Progress Notes (Signed)
  SUBJECTIVE:   CHIEF COMPLAINT / HPI:   DM2 Meds: 10 units Lantus, Trulicity 1.5 mg qwk  Today:  CBG range 87-120, mostly around 110. A1c today 6.4 Taking meds regularly, no complaints, no hypoglycemia.   PERTINENT  PMH / PSH:    OBJECTIVE:  There were no vitals taken for this visit. Physical Exam Constitutional:      Appearance: Normal appearance.  HENT:     Head: Normocephalic.  Cardiovascular:     Rate and Rhythm: Normal rate and regular rhythm.     Pulses: Normal pulses.     Heart sounds: Normal heart sounds. No murmur heard.    No friction rub. No gallop.  Pulmonary:     Effort: Pulmonary effort is normal.  Abdominal:     General: Abdomen is flat. There is no distension.     Palpations: Abdomen is soft. There is no mass.     Tenderness: There is no abdominal tenderness. There is no guarding.     Hernia: No hernia is present.  Neurological:     Mental Status: She is alert.      ASSESSMENT/PLAN:   Assessment & Plan Type 2 diabetes mellitus with other specified complication, unspecified whether long term insulin use (HCC) Patient comes in for follow-up of her diabetes.  Diabetes well-controlled, with A1c of 6.3.  Patient reports good compliance with medications.  Patient's morning CBG checks, usually around 110, couple sugars in the 80s to 90s range, will recommend decreasing insulin dose, as A1c is almost too well-controlled, with goal less than 8. - Lantus 5 units daily - Trulicity 1.5 mg weekly - Follow-up 3 months No follow-ups on file. Bess Kinds, MD 08/26/2023, 7:39 AM PGY-3, [facility]

## 2023-08-26 NOTE — Patient Instructions (Signed)
It was great to see you! Thank you for allowing me to participate in your care!  I recommend that you always bring your medications to each appointment as this makes it easy to ensure we are on the correct medications and helps [place] not miss when refills are needed.  Our plans for today:  - Diabetes  Your diabetes is doing excellent! Keep up the good work!  WE are going to decrease your dose on isnulin by 5 units   Take 5 units of Lantus daily   Continue 1.5 mg of Trulicity a week   Follow up in 3 months to recheck diabetes  Take care and seek immediate care sooner if you develop any concerns.   Dr. Bess Kinds, MD [facility]

## 2023-09-01 ENCOUNTER — Other Ambulatory Visit (HOSPITAL_COMMUNITY): Payer: Self-pay | Admitting: Family Medicine

## 2023-09-03 NOTE — Telephone Encounter (Signed)
Patient has been informed ,okay to hold Eliquis x2 days prior to procedure per Cardiology. Patient voiced understanding.

## 2023-09-14 ENCOUNTER — Encounter (HOSPITAL_COMMUNITY): Payer: Self-pay | Admitting: Gastroenterology

## 2023-09-21 ENCOUNTER — Ambulatory Visit (HOSPITAL_COMMUNITY): Payer: 59 | Admitting: Anesthesiology

## 2023-09-21 ENCOUNTER — Ambulatory Visit (HOSPITAL_COMMUNITY)
Admission: RE | Admit: 2023-09-21 | Discharge: 2023-09-21 | Disposition: A | Discharge status: Home / Self Care | Payer: 59 | Attending: Gastroenterology | Admitting: Gastroenterology

## 2023-09-21 ENCOUNTER — Encounter (HOSPITAL_COMMUNITY): Payer: Self-pay | Admitting: Gastroenterology

## 2023-09-21 ENCOUNTER — Encounter (HOSPITAL_COMMUNITY)
Admission: RE | Disposition: A | Discharge status: Home / Self Care | Payer: Self-pay | Source: Home / Self Care | Attending: Gastroenterology

## 2023-09-21 ENCOUNTER — Other Ambulatory Visit: Payer: Self-pay

## 2023-09-21 DIAGNOSIS — K314 Gastric diverticulum: Secondary | ICD-10-CM | POA: Diagnosis not present

## 2023-09-21 DIAGNOSIS — Z79899 Other long term (current) drug therapy: Secondary | ICD-10-CM | POA: Diagnosis not present

## 2023-09-21 DIAGNOSIS — K31A19 Gastric intestinal metaplasia without dysplasia, unspecified site: Secondary | ICD-10-CM | POA: Diagnosis not present

## 2023-09-21 DIAGNOSIS — K449 Diaphragmatic hernia without obstruction or gangrene: Secondary | ICD-10-CM | POA: Diagnosis not present

## 2023-09-21 DIAGNOSIS — K225 Diverticulum of esophagus, acquired: Secondary | ICD-10-CM | POA: Diagnosis not present

## 2023-09-21 DIAGNOSIS — E119 Type 2 diabetes mellitus without complications: Secondary | ICD-10-CM | POA: Insufficient documentation

## 2023-09-21 DIAGNOSIS — K2940 Chronic atrophic gastritis without bleeding: Secondary | ICD-10-CM | POA: Insufficient documentation

## 2023-09-21 DIAGNOSIS — K2100 Gastro-esophageal reflux disease with esophagitis, without bleeding: Secondary | ICD-10-CM

## 2023-09-21 DIAGNOSIS — Q399 Congenital malformation of esophagus, unspecified: Secondary | ICD-10-CM | POA: Diagnosis not present

## 2023-09-21 DIAGNOSIS — K31A11 Gastric intestinal metaplasia without dysplasia, involving the antrum: Secondary | ICD-10-CM | POA: Insufficient documentation

## 2023-09-21 DIAGNOSIS — I5020 Unspecified systolic (congestive) heart failure: Secondary | ICD-10-CM | POA: Diagnosis not present

## 2023-09-21 DIAGNOSIS — Q396 Congenital diverticulum of esophagus: Secondary | ICD-10-CM

## 2023-09-21 DIAGNOSIS — R933 Abnormal findings on diagnostic imaging of other parts of digestive tract: Secondary | ICD-10-CM | POA: Insufficient documentation

## 2023-09-21 DIAGNOSIS — Z931 Gastrostomy status: Secondary | ICD-10-CM | POA: Insufficient documentation

## 2023-09-21 DIAGNOSIS — K2289 Other specified disease of esophagus: Secondary | ICD-10-CM | POA: Diagnosis not present

## 2023-09-21 DIAGNOSIS — R1310 Dysphagia, unspecified: Secondary | ICD-10-CM

## 2023-09-21 DIAGNOSIS — K219 Gastro-esophageal reflux disease without esophagitis: Secondary | ICD-10-CM | POA: Diagnosis not present

## 2023-09-21 DIAGNOSIS — K2970 Gastritis, unspecified, without bleeding: Secondary | ICD-10-CM

## 2023-09-21 DIAGNOSIS — R194 Change in bowel habit: Secondary | ICD-10-CM

## 2023-09-21 DIAGNOSIS — K2950 Unspecified chronic gastritis without bleeding: Secondary | ICD-10-CM | POA: Insufficient documentation

## 2023-09-21 DIAGNOSIS — I509 Heart failure, unspecified: Secondary | ICD-10-CM | POA: Insufficient documentation

## 2023-09-21 DIAGNOSIS — K222 Esophageal obstruction: Secondary | ICD-10-CM | POA: Diagnosis not present

## 2023-09-21 DIAGNOSIS — E1122 Type 2 diabetes mellitus with diabetic chronic kidney disease: Secondary | ICD-10-CM | POA: Diagnosis not present

## 2023-09-21 DIAGNOSIS — I110 Hypertensive heart disease with heart failure: Secondary | ICD-10-CM | POA: Diagnosis not present

## 2023-09-21 DIAGNOSIS — N1831 Chronic kidney disease, stage 3a: Secondary | ICD-10-CM | POA: Diagnosis not present

## 2023-09-21 DIAGNOSIS — K317 Polyp of stomach and duodenum: Secondary | ICD-10-CM

## 2023-09-21 DIAGNOSIS — R1319 Other dysphagia: Secondary | ICD-10-CM

## 2023-09-21 DIAGNOSIS — K3189 Other diseases of stomach and duodenum: Secondary | ICD-10-CM

## 2023-09-21 DIAGNOSIS — I130 Hypertensive heart and chronic kidney disease with heart failure and stage 1 through stage 4 chronic kidney disease, or unspecified chronic kidney disease: Secondary | ICD-10-CM | POA: Diagnosis not present

## 2023-09-21 HISTORY — PX: ESOPHAGOGASTRODUODENOSCOPY (EGD) WITH PROPOFOL: SHX5813

## 2023-09-21 HISTORY — PX: SAVORY DILATION: SHX5439

## 2023-09-21 HISTORY — PX: BIOPSY: SHX5522

## 2023-09-21 HISTORY — PX: POLYPECTOMY: SHX5525

## 2023-09-21 LAB — GLUCOSE, CAPILLARY: Glucose-Capillary: 111 mg/dL — ABNORMAL HIGH (ref 70–99)

## 2023-09-21 SURGERY — ESOPHAGOGASTRODUODENOSCOPY (EGD) WITH PROPOFOL
Anesthesia: Monitor Anesthesia Care

## 2023-09-21 MED ORDER — SODIUM CHLORIDE 0.9 % IV SOLN: INTRAVENOUS | Status: DC | PRN

## 2023-09-21 MED ORDER — APIXABAN 2.5 MG PO TABS: 2.5000 mg | ORAL_TABLET | Freq: Two times a day (BID) | ORAL | Status: DC

## 2023-09-21 MED ORDER — PROPOFOL 10 MG/ML IV BOLUS
INTRAVENOUS | Status: DC | PRN
  Administered 2023-09-21 (×6): 10 mg via INTRAVENOUS

## 2023-09-21 MED ORDER — LIDOCAINE HCL (PF) 2 % IJ SOLN
INTRAMUSCULAR | Status: DC | PRN
  Administered 2023-09-21: 60 mg via INTRADERMAL
  Administered 2023-09-21: 40 mg via INTRADERMAL

## 2023-09-21 MED ORDER — SODIUM CHLORIDE 0.9 % IV SOLN: INTRAVENOUS | Status: DC

## 2023-09-21 MED ORDER — PROPOFOL 500 MG/50ML IV EMUL
INTRAVENOUS | Status: DC | PRN
  Administered 2023-09-21: 110 ug/kg/min via INTRAVENOUS

## 2023-09-21 MED ORDER — PROPOFOL 1000 MG/100ML IV EMUL
INTRAVENOUS | Status: AC
  Filled 2023-09-21: qty 100

## 2023-09-21 MED ORDER — OMEPRAZOLE 20 MG PO CPDR: 40.0000 mg | DELAYED_RELEASE_CAPSULE | Freq: Every morning | ORAL | 12 refills | Status: DC

## 2023-09-21 MED ORDER — OMEPRAZOLE 40 MG PO CPDR: 40.0000 mg | DELAYED_RELEASE_CAPSULE | Freq: Two times a day (BID) | ORAL | 0 refills | Status: DC

## 2023-09-21 MED ORDER — SUCRALFATE 1 GM/10ML PO SUSP: 1.0000 g | Freq: Two times a day (BID) | ORAL | 0 refills | Status: DC

## 2023-09-21 SURGICAL SUPPLY — 14 items

## 2023-09-21 NOTE — Discharge Instructions (Signed)

## 2023-09-21 NOTE — Anesthesia Preprocedure Evaluation (Addendum)
Anesthesia Evaluation  Patient identified by MRN, date of birth, ID band Patient awake    Reviewed: Allergy & Precautions, NPO status , Patient's Chart, lab work & pertinent test results  Airway Mallampati: II  TM Distance: >3 FB Neck ROM: Full    Dental no notable dental hx. (+) Edentulous Upper, Edentulous Lower   Pulmonary neg pulmonary ROS   Pulmonary exam normal        Cardiovascular hypertension, Pt. on medications and Pt. on home beta blockers + CAD, + Past MI and +CHF   Rhythm:Regular Rate:Normal     Neuro/Psych    Depression    negative neurological ROS     GI/Hepatic Neg liver ROS,GERD  Medicated,,dysphagia   Endo/Other  diabetes    Renal/GU   negative genitourinary   Musculoskeletal   Abdominal Normal abdominal exam  (+)   Peds  Hematology Lab Results      Component                Value               Date                      WBC                      6.3                 12/01/2022                HGB                      11.1 (L)            12/01/2022                HCT                      35.2 (L)            12/01/2022                MCV                      97.5                12/01/2022                PLT                      139 (L)             12/01/2022             Lab Results      Component                Value               Date                      NA                       138                 06/04/2023                K  4.7                 06/04/2023                CO2                      24                  06/04/2023                GLUCOSE                  140 (H)             06/04/2023                BUN                      16                  06/04/2023                CREATININE               1.23 (H)            06/04/2023                CALCIUM                  9.2                 06/04/2023                EGFR                     55 (L)              03/23/2023                 GFRNONAA                 44 (L)              06/04/2023              Anesthesia Other Findings   Reproductive/Obstetrics                             Anesthesia Physical Anesthesia Plan  ASA: 3  Anesthesia Plan: MAC   Post-op Pain Management:    Induction: Intravenous  PONV Risk Score and Plan: 2 and Propofol infusion and Treatment may vary due to age or medical condition  Airway Management Planned: Simple Face Mask and Nasal Cannula  Additional Equipment: None  Intra-op Plan:   Post-operative Plan:   Informed Consent: I have reviewed the patients History and Physical, chart, labs and discussed the procedure including the risks, benefits and alternatives for the proposed anesthesia with the patient or authorized representative who has indicated his/her understanding and acceptance.     Dental advisory given  Plan Discussed with: CRNA  Anesthesia Plan Comments:        Anesthesia Quick Evaluation

## 2023-09-21 NOTE — Anesthesia Postprocedure Evaluation (Signed)
Anesthesia Post Note  Patient: Dana Coleman  Procedure(s) Performed: ESOPHAGOGASTRODUODENOSCOPY (EGD) WITH PROPOFOL SAVORY DILATION BIOPSY POLYPECTOMY     Patient location during evaluation: Endoscopy Anesthesia Type: MAC Level of consciousness: awake and alert Pain management: pain level controlled Vital Signs Assessment: post-procedure vital signs reviewed and stable Respiratory status: spontaneous breathing, nonlabored ventilation, respiratory function stable and patient connected to nasal cannula oxygen Cardiovascular status: stable and blood pressure returned to baseline Postop Assessment: no apparent nausea or vomiting Anesthetic complications: no   No notable events documented.  Last Vitals:  Vitals:   09/21/23 1140 09/21/23 1151  BP: (!) 157/70 (!) 162/66  Pulse: 63 (!) 57  Resp: 11 10  Temp:    SpO2: 99% 98%    Last Pain:  Vitals:   09/21/23 1151  TempSrc:   PainSc: 0-No pain                 Earl Lites P Makaela Cando

## 2023-09-21 NOTE — H&P (Signed)
GASTROENTEROLOGY PROCEDURE H&P NOTE   Primary Care Physician: Glendale Chard, DO  HPI: Dana Coleman ([place]) is a 82 y.o. female who presents for EGD for followup dysphagia and abnormal barium swallow.  Past Medical History:  Diagnosis Date   Acute pulmonary edema (HCC)    Acute respiratory failure with hypoxemia (HCC)    Acute respiratory failure with hypoxia (HCC)    Allergy    occ uses OTC allergy meds    Breast cancer (HCC) 04/2008   Right breast   Cataracts, bilateral    removed bilat    Cervical cancer (HCC)    When the patient was in her 30s   Depression    Empyema (HCC)    Esophageal perforation 05/05/2021   Failure to thrive in adult 07/22/2021   Wt Readings from Last 3 Encounters: 06/26/21 65.8 kg 06/17/21 64.8 kg 04/26/21 58.1 kg     Gastrostomy tube in place [facility]) 08/21/2021   Heart attack (HCC) 2004   History of cervical cancer    S/p radiation and surgical removal in 30's.  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 Donnie Coffin   PEG (percutaneous endoscopic gastrostomy) status (HCC) 07/23/2021   Personal history of radiation therapy 2009   Pressure injury of skin 05/05/2021   Protein-calorie malnutrition, severe (HCC) 05/15/2021   Pyelonephritis 11/06/2021   S/P thoracotomy    Past Surgical History:  Procedure Laterality Date   BREAST LUMPECTOMY Right 2009   BREAST SURGERY  2009   right lumpectomy   CERVIX SURGERY  1980   CHOLECYSTECTOMY N/A 05/02/2021   Procedure: LAPAROSCOPIC CHOLECYSTECTOMY;  Surgeon: Gaynelle Adu, MD;  Location: [facility] ORS;  Service: General;  Laterality: N/A;   COLONOSCOPY     IR GASTROSTOMY TUBE MOD SED  06/03/2021   IR GASTROSTOMY TUBE REMOVAL  10/08/2021   LEFT HEART CATHETERIZATION WITH  CORONARY ANGIOGRAM N/A 04/20/2014   Procedure: LEFT HEART CATHETERIZATION WITH CORONARY ANGIOGRAM;  Surgeon: Othella Boyer, MD;  Location: [facility] CATH LAB;  Service: Cardiovascular;  Laterality: N/A;   POLYPECTOMY     STENT PLACEMENT VASCULAR ([facility] HX)     THORACOTOMY Right 05/05/2021   Procedure: THORACOTOMY MAJOR REPAIR PERFORATED ESOPHAGUS;  Surgeon: Alleen Borne, MD;  Location: [facility] OR;  Service: Thoracic;  Laterality: Right;   thyroid     for nodule hemithyroidectomy, benign   THYROIDECTOMY Right 03/2003   Current Facility-Administered Medications  Medication Dose Route Frequency Provider Last Rate Last Admin   0.9 %  sodium chloride infusion   Intravenous Continuous Mansouraty, Netty Starring., MD        Current Facility-Administered Medications:    0.9 %  sodium chloride infusion, , Intravenous, Continuous, Mansouraty, Netty Starring., MD No Known Allergies Family History  Problem Relation Age of Onset   Heart disease Mother        age 36   Heart disease Father    Cancer Father        unknown   Heart disease Brother    Hypertension Brother    Cirrhosis Brother    Diabetes Daughter    Stroke Daughter    Kidney disease Daughter    Hypertension Son    Colon cancer Neg Hx  Colon polyps Neg Hx    Esophageal cancer Neg Hx    Stomach cancer Neg Hx    Rectal cancer Neg Hx    Inflammatory bowel disease Neg Hx    Liver disease Neg Hx    Pancreatic cancer Neg Hx    Social History   Socioeconomic History   Marital status: Divorced    Spouse name: Not on file   Number of children: 5   Years of education: 10   Highest education level: 10th grade  Occupational History   Occupation: Retired- Metallurgist  Tobacco Use   Smoking status: Never   Smokeless tobacco: Never   Tobacco comments:    No plans to start  Vaping Use   Vaping status: Never Used  Substance and Sexual Activity   Alcohol use: No   Drug use: No   Sexual activity: Not Currently    Birth  control/protection: Condom  Other Topics Concern   Not on file  Social History Narrative   Patient lives alone in [place].   Patient is close with daughters Sunny [place] and [place]. One Son in [place]. 2 children have passed.    Patient depends on her sister and brother for support and any assistance needed.    Talbert Forest (sister) [phone].   Religious / Personal Beliefs: Baptist   Patient does not drive.   Diet: Pt has a varied diet of protein, starch, and vegetables and fruits.   Seatbelts: Pt reports wearing seatbelt when in vehicles.    Hobbies: shopping, visiting family, church   Education / Work:  [employer]                                                              Social Determinants of Health   Financial Resource Strain: Low Risk  (04/17/2023)   Overall Financial Resource Strain (CARDIA)    Difficulty of Paying Living Expenses: Not hard at all  Food Insecurity: No Food Insecurity (04/17/2023)   Hunger Vital Sign    Worried About Running Out of Food in the Last Year: Never true    Ran Out of Food in the Last Year: Never true  Transportation Needs: No Transportation Needs (04/17/2023)   PRAPARE - Administrator, Civil Service (Medical): No    Lack of Transportation (Non-Medical): No  Physical Activity: Insufficiently Active (04/17/2023)   Exercise Vital Sign    Days of Exercise per Week: 3 days    Minutes of Exercise per Session: 30 min  Stress: No Stress Concern Present (04/17/2023)   [employer] of Occupational Health - Occupational Stress Questionnaire    Feeling of Stress : Only a little  Recent Concern: Stress - Stress Concern Present (01/20/2023)   [employer] of Occupational Health - Occupational Stress Questionnaire    Feeling of Stress : To some extent  Social Connections: Moderately Isolated (04/17/2023)   Social Connection and Isolation Panel [NHANES]    Frequency of Communication with Friends and Family: More than three times  a week    Frequency of Social Gatherings with Friends and Family: Three times a week    Attends Religious Services: More than 4 times per year    Active Member of Clubs or Organizations: No    Attends Club or  Organization Meetings: Never    Marital Status: Divorced  Catering manager Violence: Not At Risk (04/17/2023)   Humiliation, Afraid, Rape, and Kick questionnaire    Fear of Current or Ex-Partner: No    Emotionally Abused: No    Physically Abused: No    Sexually Abused: No    Physical Exam: Today's Vitals   09/14/23 1303 09/21/23 1016  BP:  (!) 150/73  Pulse:  62  Resp:  14  Temp:  98 F (36.7 C)  TempSrc:  Temporal  SpO2:  100%  Weight: 56.8 kg 56.7 kg  Height:  5\' 2"  (1.575 m)  PainSc:  0-No pain   Body mass index is 22.86 kg/m. GEN: NAD EYE: Sclerae anicteric ENT: MMM CV: Non-tachycardic GI: Soft, NT/ND NEURO:  Alert & Oriented x 3  Lab Results: No results for input(s): "WBC", "HGB", "HCT", "PLT" in the last 72 hours. BMET No results for input(s): "NA", "K", "CL", "CO2", "GLUCOSE", "BUN", "CREATININE", "CALCIUM" in the last 72 hours. LFT No results for input(s): "PROT", "ALBUMIN", "AST", "ALT", "ALKPHOS", "BILITOT", "BILIDIR", "IBILI" in the last 72 hours. PT/INR No results for input(s): "LABPROT", "INR" in the last 72 hours.   Impression / Plan: This is a 82 y.o.female who presents for EGD for followup dysphagia and abnormal barium swallow.  The risks and benefits of endoscopic evaluation/treatment were discussed with the patient and/or family; these include but are not limited to the risk of perforation, infection, bleeding, missed lesions, lack of diagnosis, severe illness requiring hospitalization, as well as anesthesia and sedation related illnesses.  The patient's history has been reviewed, patient examined, no change in status, and deemed stable for procedure.  The patient and/or family is agreeable to proceed.    Corliss Parish, MD [place]  Gastroenterology Advanced Endoscopy Office # [phone]

## 2023-09-21 NOTE — Op Note (Signed)
[facility] Patient Name: [place] Procedure Date: 09/21/2023 MRN: [id number] Attending MD: Corliss Parish , MD, [id number] Date of Birth: 09-Apr-1941 CSN: [id number] Age: 82 Admit Type: Outpatient Procedure:                Upper GI endoscopy Indications:              Dysphagia, Abnormal cine-esophagram, Endoscopy to                            confirm esophageal stricture that was demonstrated                            on previous imaging study Providers:                Corliss Parish, MD, Doristine Mango, RN,                            Kandice Robinsons, Technician Referring MD:              Medicines:                Monitored Anesthesia Care Complications:            No immediate complications. Estimated Blood Loss:     Estimated blood loss was minimal. Procedure:                Pre-Anesthesia Assessment:                           - Prior to the procedure, a History and Physical                            was performed, and patient medications and                            allergies were reviewed. The patient's tolerance of                            previous anesthesia was also reviewed. The risks                            and benefits of the procedure and the sedation                            options and risks were discussed with the patient.                            All questions were answered, and informed consent                            was obtained. Prior Anticoagulants: The patient has                            taken Eliquis (apixaban), last dose was 2 days  prior to procedure. ASA Grade Assessment: III - A                            patient with severe systemic disease. After                            reviewing the risks and benefits, the patient was                            deemed in satisfactory condition to undergo the                            procedure.                           After obtaining  informed consent, the endoscope was                            passed under direct vision. Throughout the                            procedure, the patient's blood pressure, pulse, and                            oxygen saturations were monitored continuously. The                            GIF-H190 ([id number] Olympus endoscope was introduced                            through the mouth, and advanced to the second part                            of duodenum. The GIF-H190 ([id number]) Olympus                            endoscope was introduced through the mouth, and                            advanced to the second part of duodenum. The upper                            GI endoscopy was accomplished without difficulty.                            The patient tolerated the procedure. Scope In: Scope Out: Findings:      The examined esophagus was moderately tortuous.      At least 3 non-bleeding diverticuluae with a medium to large large       opening and no stigmata of recent bleeding were found in the mid       esophagus and in the distal esophagus.      A low-grade of narrowing Schatzki ring was found at the lower esophageal       sphincter. Disruption biopsies performed circumferentially.  No other gross mucosal lesions were noted in the entire esophagus.       Biopsies were taken with a cold forceps for histology to rule out EOE.      The Z-line was irregular and was found 35 cm from the incisors.      After the rest of the procedure was completed, a guidewire was placed       and the scope was withdrawn. Dilation was performed in the esophagus       with a Savary dilator with mild resistance at 14 mm. The dilation site       was examined following endoscope reinsertion and showed just below the       UES, a moderate mucosal disruption, moderate improvement in luminal       narrowing and no perforation.      A 3 cm hiatal hernia was present.      A small diverticulum was found on the  anterior wall of the stomach.      Two 6 to 9 mm sessile polyps with no bleeding and no stigmata of recent       bleeding were found in the gastric body. These polyps were removed with       a cold snare. Resection and retrieval were complete.      Patchy mildly erythematous mucosa without bleeding was found in the       entire examined stomach. Biopsies were taken with a cold forceps for       histology and Helicobacter pylori testing.      No gross lesions were noted in the duodenal bulb, in the first portion       of the duodenum and in the second portion of the duodenum. Impression:               - Moderately tortuous esophagus.                           - Multiple diverticuli were found in the mid                            esophagus and in the distal esophagus.                           - Low-grade of narrowing Schatzki ring. Disrupted.                           - No other gross mucosal lesions in the entire                            esophagus. Biopsied.                           - Z-line irregular, 35 cm from the incisors.                           - Dilation performed in the esophagus up to 14 mm                            savory with mucosal wrent noted just below the UES.                           -  3 cm hiatal hernia.                           - Gastric diverticulum noted in anterior stomach,                            most likely previous PEG tube sites.                           - Two gastric polyps. Resected and retrieved.                           - Erythematous mucosa in the stomach. Biopsied.                           - No gross lesions in the duodenal bulb, in the                            first portion of the duodenum and in the second                            portion of the duodenum. Moderate Sedation:      Not Applicable - Patient had care per Anesthesia. Recommendation:           - The patient will be observed post-procedure,                            until all  discharge criteria are met.                           - Discharge patient to home.                           - Patient has a contact number available for                            emergencies. The signs and symptoms of potential                            delayed complications were discussed with the                            patient. Return to normal activities tomorrow.                            Written discharge instructions were provided to the                            patient.                           - Dilation diet as per protocol. Liquids until 1 PM                            as long as she  is tolerating that then she can do                            full liquids and advance to soft diet to maintain                            for next few days.                           - For 1 month increase omeprazole to 40 mg twice                            daily (new prescription sent for this). After 1                            month may go back to omeprazole 40 mg once daily                            (patient taking 20 mg tablets x 2).                           - Carafate 1 g twice daily liquid slurry for 2                            weeks. Then may stop.                           - May restart aspirin tomorrow.                           - May restart Eliquis on 12/11 to decrease risk of                            post interventional bleeding.                           - Please use Cepacol or Halls Lozenges +/-                            Chloraseptic spray for next 72-96 hours to aid in                            sore thoat should you experience this.                           - Observe patient's clinical course.                           - Await pathology results.                           - I suspect that the patient's dysphagia symptoms  are multifactorial. This is most likely a result of                            the cervical stricture/narrowing that  we opened up                            with 14 mm savory dilation, as well as multiple                            esophageal diverticulum (medium to large in size)                            noted as well as tortuosity/presbyesophagus as well                            as the distal esophageal Schatzki ring (disrupted                            today). I am not sure that she will ever be                            dysphagia free. Will see how she does. Could                            consider further dilation in future. I am not sure                            I would pursue manometry at this time.                           - If gastric polyps are adenomatous, repeat upper                            endoscopy in 1 year to reevaluate site would be                            recommended.                           - The findings and recommendations were discussed                            with the patient.                           - The findings and recommendations were discussed                            with the patient's family. Procedure Code(s):        --- Professional ---                           [phone], Esophagogastroduodenoscopy, flexible,  transoral; with removal of tumor(s), polyp(s), or                            other lesion(s) by snare technique                           43248, Esophagogastroduodenoscopy, flexible,                            transoral; with insertion of guide wire followed by                            passage of dilator(s) through esophagus over guide                            wire                           43239, 59, Esophagogastroduodenoscopy, flexible,                            transoral; with biopsy, single or multiple Diagnosis Code(s):        --- Professional ---                           Q39.9, Congenital malformation of esophagus,                            unspecified                           Q39.6, Congenital  diverticulum of esophagus                           K22.2, Esophageal obstruction                           K44.9, Diaphragmatic hernia without obstruction or                            gangrene                           K31.4, Gastric diverticulum                           K31.7, Polyp of stomach and duodenum                           K31.89, Other diseases of stomach and duodenum                           R13.10, Dysphagia, unspecified                           R93.3, Abnormal findings on diagnostic imaging of  other parts of digestive tract CPT copyright 2022 [employer]. All rights reserved. The codes documented in this report are preliminary and upon coder review may  be revised to meet current compliance requirements. Corliss Parish, MD 09/21/2023 11:39:21 AM Number of Addenda: 0

## 2023-09-21 NOTE — Transfer of Care (Signed)
Immediate Anesthesia Transfer of Care Note  Patient: Dana Coleman  Procedure(s) Performed: ESOPHAGOGASTRODUODENOSCOPY (EGD) WITH PROPOFOL SAVORY DILATION BIOPSY POLYPECTOMY  Patient Location: PACU  Anesthesia Type:MAC  Level of Consciousness: awake and alert   Airway & Oxygen Therapy: Patient Spontanous Breathing  Post-op Assessment: Report given to RN  Post vital signs: Reviewed and stable  Last Vitals:  Vitals Value Taken Time  BP 148/71 09/21/23 1132  Temp 97.4   Pulse 68 09/21/23 1133  Resp 16 09/21/23 1133  SpO2 100 % 09/21/23 1133  Vitals shown include unfiled device data.  Last Pain:  Vitals:   09/21/23 1016  TempSrc: Temporal  PainSc: 0-No pain         Complications: No notable events documented.

## 2023-09-23 ENCOUNTER — Encounter (HOSPITAL_COMMUNITY): Payer: Self-pay | Admitting: Gastroenterology

## 2023-09-23 LAB — SURGICAL PATHOLOGY

## 2023-09-24 ENCOUNTER — Encounter: Payer: Self-pay | Admitting: Gastroenterology

## 2023-10-02 DIAGNOSIS — I2699 Other pulmonary embolism without acute cor pulmonale: Secondary | ICD-10-CM | POA: Diagnosis not present

## 2023-10-02 DIAGNOSIS — I25118 Atherosclerotic heart disease of native coronary artery with other forms of angina pectoris: Secondary | ICD-10-CM | POA: Diagnosis not present

## 2023-10-02 DIAGNOSIS — I5022 Chronic systolic (congestive) heart failure: Secondary | ICD-10-CM | POA: Diagnosis not present

## 2023-10-02 DIAGNOSIS — I420 Dilated cardiomyopathy: Secondary | ICD-10-CM | POA: Diagnosis not present

## 2023-10-13 ENCOUNTER — Other Ambulatory Visit: Payer: Self-pay | Admitting: Gastroenterology

## 2023-10-15 ENCOUNTER — Ambulatory Visit (HOSPITAL_COMMUNITY)
Admission: RE | Admit: 2023-10-15 | Discharge: 2023-10-15 | Disposition: A | Discharge status: Home / Self Care | Payer: 59 | Source: Ambulatory Visit | Attending: Family Medicine | Admitting: Family Medicine

## 2023-10-15 ENCOUNTER — Encounter (HOSPITAL_COMMUNITY): Payer: Self-pay

## 2023-10-15 VITALS — BP 122/80 | HR 76 | Ht 62.0 in | Wt 123.4 lb

## 2023-10-15 DIAGNOSIS — Z955 Presence of coronary angioplasty implant and graft: Secondary | ICD-10-CM | POA: Diagnosis not present

## 2023-10-15 DIAGNOSIS — Z9011 Acquired absence of right breast and nipple: Secondary | ICD-10-CM | POA: Diagnosis not present

## 2023-10-15 DIAGNOSIS — E785 Hyperlipidemia, unspecified: Secondary | ICD-10-CM | POA: Diagnosis not present

## 2023-10-15 DIAGNOSIS — Z7985 Long-term (current) use of injectable non-insulin antidiabetic drugs: Secondary | ICD-10-CM | POA: Insufficient documentation

## 2023-10-15 DIAGNOSIS — Z923 Personal history of irradiation: Secondary | ICD-10-CM | POA: Diagnosis not present

## 2023-10-15 DIAGNOSIS — I110 Hypertensive heart disease with heart failure: Secondary | ICD-10-CM | POA: Insufficient documentation

## 2023-10-15 DIAGNOSIS — I480 Paroxysmal atrial fibrillation: Secondary | ICD-10-CM | POA: Diagnosis not present

## 2023-10-15 DIAGNOSIS — I5082 Biventricular heart failure: Secondary | ICD-10-CM | POA: Insufficient documentation

## 2023-10-15 DIAGNOSIS — I252 Old myocardial infarction: Secondary | ICD-10-CM | POA: Insufficient documentation

## 2023-10-15 DIAGNOSIS — Z853 Personal history of malignant neoplasm of breast: Secondary | ICD-10-CM | POA: Insufficient documentation

## 2023-10-15 DIAGNOSIS — Z79899 Other long term (current) drug therapy: Secondary | ICD-10-CM | POA: Diagnosis not present

## 2023-10-15 DIAGNOSIS — Z87440 Personal history of urinary (tract) infections: Secondary | ICD-10-CM | POA: Insufficient documentation

## 2023-10-15 DIAGNOSIS — I10 Essential (primary) hypertension: Secondary | ICD-10-CM

## 2023-10-15 DIAGNOSIS — Z794 Long term (current) use of insulin: Secondary | ICD-10-CM | POA: Insufficient documentation

## 2023-10-15 DIAGNOSIS — Z7901 Long term (current) use of anticoagulants: Secondary | ICD-10-CM | POA: Insufficient documentation

## 2023-10-15 DIAGNOSIS — I5020 Unspecified systolic (congestive) heart failure: Secondary | ICD-10-CM

## 2023-10-15 DIAGNOSIS — I5022 Chronic systolic (congestive) heart failure: Secondary | ICD-10-CM | POA: Diagnosis not present

## 2023-10-15 DIAGNOSIS — R5383 Other fatigue: Secondary | ICD-10-CM | POA: Diagnosis not present

## 2023-10-15 DIAGNOSIS — I2510 Atherosclerotic heart disease of native coronary artery without angina pectoris: Secondary | ICD-10-CM | POA: Insufficient documentation

## 2023-10-15 DIAGNOSIS — Z86711 Personal history of pulmonary embolism: Secondary | ICD-10-CM | POA: Diagnosis not present

## 2023-10-15 DIAGNOSIS — E119 Type 2 diabetes mellitus without complications: Secondary | ICD-10-CM | POA: Insufficient documentation

## 2023-10-15 LAB — CBC
HCT: 35.5 % — ABNORMAL LOW (ref 36.0–46.0)
Hemoglobin: 11.3 g/dL — ABNORMAL LOW (ref 12.0–15.0)
MCH: 31.2 pg (ref 26.0–34.0)
MCHC: 31.8 g/dL (ref 30.0–36.0)
MCV: 98.1 fL (ref 80.0–100.0)
Platelets: 111 10*3/uL — ABNORMAL LOW (ref 150–400)
RBC: 3.62 MIL/uL — ABNORMAL LOW (ref 3.87–5.11)
RDW: 13.6 % (ref 11.5–15.5)
WBC: 4.5 10*3/uL (ref 4.0–10.5)
nRBC: 0 % (ref 0.0–0.2)

## 2023-10-15 LAB — BRAIN NATRIURETIC PEPTIDE: B Natriuretic Peptide: 119.4 pg/mL — ABNORMAL HIGH (ref 0.0–100.0)

## 2023-10-15 LAB — IRON AND TIBC
Iron: 23 ug/dL — ABNORMAL LOW (ref 28–170)
Saturation Ratios: 8 % — ABNORMAL LOW (ref 10.4–31.8)
TIBC: 304 ug/dL (ref 250–450)
UIBC: 281 ug/dL

## 2023-10-15 LAB — BASIC METABOLIC PANEL
Anion gap: 9 (ref 5–15)
BUN: 12 mg/dL (ref 8–23)
CO2: 24 mmol/L (ref 22–32)
Calcium: 8.7 mg/dL — ABNORMAL LOW (ref 8.9–10.3)
Chloride: 104 mmol/L (ref 98–111)
Creatinine, Ser: 1.18 mg/dL — ABNORMAL HIGH (ref 0.44–1.00)
GFR, Estimated: 46 mL/min — ABNORMAL LOW (ref >60)
Glucose, Bld: 174 mg/dL — ABNORMAL HIGH (ref 70–99)
Potassium: 4 mmol/L (ref 3.5–5.1)
Sodium: 137 mmol/L (ref 135–145)

## 2023-10-15 LAB — FERRITIN: Ferritin: 79 ng/mL (ref 11–307)

## 2023-10-15 NOTE — Progress Notes (Signed)
 ADVANCED HF CLINIC NOTE  Primary Care: Cleotilde Perkins, DO Primary Cardiologist: Dr. Levern HF Cardiologist: Dr. Cherrie  CC: HF follow up  HPI: Dana  Coleman is a 83 y.o. female w/ h/o CAD, s/p remote inferior wall MI in 2000 treated w/ PCI to RCA, hypertension, type 2DM, HLD and h/o right sided breast cancer treated w/ radiation and mastectomy, no chemotherapy.    Followed by Dr. Levern Echo 2019 EF 55-60%, G1D, RV normal  NST 2019 c/w prior inferior myocardial infarction. Small mild distal anterior/apical infarct with mild peri-infarct ischemia>> Low risk study    Admitted 04/2021 for nearly 6 wks. She had laparoscopic cholecystectomy on 05/02/21, discharged home, developed abdominal pain associated with sudden onset of altered mental status noted to be hypotensive, bradycardic and hypoxic by EMS requiring intubation and subsequently was noted to have a right empyema and perforation of the esophagus requiring emergent thoracotomy w/ repair of esophageal perforation with intercostal pedicled muscle flap, drainage of empyema and decortication of right lung.    Post-op she developed EKG changes w/ minimal inferior ST elevation and troponin leak. Echo showed moderately reduced LVEF, 40%, and severely reduced RV systolic function. However given recent surgery, she was not a candidate for intervention. She later developed increased O2 requirements, subsequently had CT angiogram which showed bilateral pulmonary embolism.  Repeat limited echo showed EF 25-30%, RV normal. No evidence of significant strain>>>IV heparin >>Eliquis . Hospitalization also notable for atrial flutter. Dr. Levern followed during hospitalization.    Of note EKG 1/23 showed afib w/ CVR. F/u EKGs 3/23 showed NSR.   Admit 3/23 for a/c CHF. Remained in NSR during admission. Echo EF 25-30%, RV moderately reduced right ventricular systolic pressure 31.7 mmHg, mild MR. Diuresed 9 lb down to a dry wt of 119 lb.  Admitted 6/23  with CP, cardiology consulted. CHF compensated and underwent nuclear stress test which showed LVEF 25%, large fixed defect involving apical and inferior myocardium consistent with old infarction. Hospitalization c/b dizziness and low BP. GDMT scaled back.  Echo 06/02/22 EF 30-35% previous inferior, inferolateral MI   ICD deferred given age and overall situation.  Today she returns for HF follow up. Overall feeling fine. Main complaint is fatigue. She has SOB making her bed, and when walking further distances on flat ground, uses a cane when ambulating. She lives alone and manages her medications. She has positional dizziness, no recent falls. Denies palpitations, CP, edema, or PND/Orthopnea. Appetite ok. No fever or chills. Not weighing at home.Taking all medications.   Cardiac Testing  - Echo (8/23): EF 30-35%, previous inferior, inferolateral MI.  - Lexiscan  myoview  (6/23): LVEF 25%, large fixed defect apical and inferior myocardium, consistent with old infarction.   - Echo (3/23): EF 25-30%, RV moderately reduced right ventricular systolic pressure is 31.7 mmHg, mild MR   - Ltd Echo (8/22): EF 25-30%, RV normal   - Echo (7/22): EF 45%, RV severely reduced   - NST 12/2017  There was no ST segment deviation noted during stress. T wave inversion was noted during stress in the III leads, beginning at 1 minutes of stress. T wave inversion persisted. Findings consistent with prior inferior myocardial infarction. Small mild distal anterior/apical infarct with mild peri-infarct ischemia. This is a low risk study. Mild area of current ischemia. The left ventricular ejection fraction is normal (55-65%).     - Echo (3/19): EF 55-60%, G1D, RV normal   Past Medical History:  Diagnosis Date   Acute pulmonary edema (HCC)  Acute respiratory failure with hypoxemia (HCC)    Acute respiratory failure with hypoxia (HCC)    Allergy    occ uses OTC allergy meds    Breast cancer (HCC) 04/2008    Right breast   Cataracts, bilateral    removed bilat    Cervical cancer (HCC)    When the patient was in her 30s   Depression    Empyema (HCC)    Esophageal perforation 05/05/2021   Failure to thrive in adult 07/22/2021   Wt Readings from Last 3 Encounters: 06/26/21 65.8 kg 06/17/21 64.8 kg 04/26/21 58.1 kg     Gastrostomy tube in place [facility]) 08/21/2021   Heart attack (HCC) 2004   History of cervical cancer    S/p radiation and surgical removal in 30's.  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 Melodye   PEG (percutaneous endoscopic gastrostomy) status (HCC) 07/23/2021   Personal history of radiation therapy 2009   Pressure injury of skin 05/05/2021   Protein-calorie malnutrition, severe (HCC) 05/15/2021   Pyelonephritis 11/06/2021   S/P thoracotomy    Current Outpatient Medications  Medication Sig Dispense Refill   acetaminophen  (TYLENOL ) 500 MG tablet Take 500 mg by mouth every 6 (six) hours as needed for moderate pain.     apixaban  (ELIQUIS ) 2.5 MG TABS tablet Take 1 tablet (2.5 mg total) by mouth 2 (two) times daily.     ascorbic acid (VITAMIN C) 500 MG tablet Take 500 mg by mouth daily.     aspirin  EC 81 MG tablet Take 81 mg by mouth in the morning. Swallow whole.     bisacodyl (DULCOLAX) 5 MG EC tablet Take 5 mg by mouth every other day.     Blood Glucose Monitoring Suppl MISC daily.     Dulaglutide  (TRULICITY ) 1.5 MG/0.5ML SOPN Inject 1.5 mg into the skin once a week. 2 mL 5   ENTRESTO  24-26 MG TAKE 1 TABLET BY MOUTH TWICE A DAY 60 tablet 2   erythromycin ophthalmic ointment Place 1 Application into both eyes 4 (four) times daily.     fluticasone  (FLONASE ) 50 MCG/ACT nasal spray Place 1 spray into both nostrils daily as needed for  allergies or rhinitis. 1 spray in each nostril every day 16 g 12   furosemide  (LASIX ) 80 MG tablet Take 1 tablet (80 mg total) by mouth daily. 30 tablet 11   insulin  glargine (LANTUS  SOLOSTAR) 100 UNIT/ML Solostar Pen INJECT 10 UNITS INTO THE SKIN DAILY 15 mL 3   Insulin  Pen Needle (B-D UF III MINI PEN NEEDLES) 31G X 5 MM MISC INJECT INSULIN  VIA PEN 6 TIMES DAILY 200 each 3   isosorbide  mononitrate (IMDUR ) 30 MG 24 hr tablet TAKE 1 TABLET BY MOUTH EVERY DAY 90 tablet 3   Lancets (ONETOUCH DELICA PLUS LANCET33G) MISC APPLY ROUTE AS NEEDED. 100 each PRN   linaclotide (LINZESS) 72 MCG capsule Take 72 mcg by mouth daily before breakfast.     Melatonin 3 MG TBDP Take 6 mg by mouth at bedtime as needed (sleep).     metoprolol  succinate (TOPROL -XL) 25 MG 24 hr tablet TAKE 1/2 TABLET BY MOUTH DAILY 45 tablet 3   nitroGLYCERIN  (NITROSTAT ) 0.4 MG SL tablet Place 0.4 mg  under the tongue every 5 (five) minutes as needed for chest pain.     omeprazole  (PRILOSEC) 40 MG capsule TAKE 1 CAPSULE (40 MG TOTAL) BY MOUTH 2 (TWO) TIMES DAILY BEFORE A MEAL. 180 capsule 1   ONETOUCH ULTRA test strip CHECK BLOOD SUGAR TWICE DAILY 100 strip 1   polycarbophil (FIBERCON) 625 MG tablet Take 625 mg by mouth daily.     rosuvastatin  (CRESTOR ) 20 MG tablet TAKE 1 TABLET BY MOUTH EVERYDAY AT BEDTIME 90 tablet 1   spironolactone  (ALDACTONE ) 25 MG tablet TAKE 1/2 TABLET BY MOUTH EVERY DAY 45 tablet 3   traZODone  (DESYREL ) 50 MG tablet TAKE 1 TABLET BY MOUTH EVERY DAY AT BEDTIME AS NEEDED FOR SLEEP 90 tablet 1   [START ON 10/22/2023] omeprazole  (PRILOSEC) 20 MG capsule Take 2 capsules (40 mg total) by mouth every morning. (Patient not taking: Reported on 10/15/2023) 60 capsule 12   No current facility-administered medications for this encounter.   No Known Allergies  Social History   Socioeconomic History   Marital status: Divorced    Spouse name: Not on file   Number of children: 5   Years of education: 10   Highest education  level: 10th grade  Occupational History   Occupation: Retired- Metallurgist  Tobacco Use   Smoking status: Never   Smokeless tobacco: Never   Tobacco comments:    No plans to start  Vaping Use   Vaping status: Never Used  Substance and Sexual Activity   Alcohol use: No   Drug use: No   Sexual activity: Not Currently    Birth control/protection: Condom  Other Topics Concern   Not on file  Social History Narrative   Patient lives alone in [place].   Patient is close with daughters Bobbette and Myra. One Son in [place] . 2 children have passed.    Patient depends on her sister and brother for support and any assistance needed.    Orlean (sister) [phone].   Religious / Personal Beliefs: Baptist   Patient does not drive.   Diet: Pt has a varied diet of protein, starch, and vegetables and fruits.   Seatbelts: Pt reports wearing seatbelt when in vehicles.    Hobbies: shopping, visiting family, church   Education / Work:  [employer]                                                              Social Drivers of Corporate Investment Banker Strain: Low Risk  (04/17/2023)   Overall Financial Resource Strain (CARDIA)    Difficulty of Paying Living Expenses: Not hard at all  Food Insecurity: No Food Insecurity (04/17/2023)   Hunger Vital Sign    Worried About Running Out of Food in the Last Year: Never true    Ran Out of Food in the Last Year: Never true  Transportation Needs: No Transportation Needs (04/17/2023)   PRAPARE - Administrator, Civil Service (Medical): No    Lack of Transportation (Non-Medical): No  Physical Activity: Insufficiently Active (04/17/2023)   Exercise Vital Sign    Days of Exercise per Week: 3 days    Minutes of Exercise per Session: 30 min  Stress: No Stress Concern Present (04/17/2023)   [employer] of  Occupational Health - Occupational Stress Questionnaire    Feeling of Stress : Only a little  Recent  Concern: Stress - Stress Concern Present (01/20/2023)   [employer] of Occupational Health - Occupational Stress Questionnaire    Feeling of Stress : To some extent  Social Connections: Moderately Isolated (04/17/2023)   Social Connection and Isolation Panel [NHANES]    Frequency of Communication with Friends and Family: More than three times a week    Frequency of Social Gatherings with Friends and Family: Three times a week    Attends Religious Services: More than 4 times per year    Active Member of Clubs or Organizations: No    Attends Banker Meetings: Never    Marital Status: Divorced  Catering Manager Violence: Not At Risk (04/17/2023)   Humiliation, Afraid, Rape, and Kick questionnaire    Fear of Current or Ex-Partner: No    Emotionally Abused: No    Physically Abused: No    Sexually Abused: No   Family History  Problem Relation Age of Onset   Heart disease Mother        age 24   Heart disease Father    Cancer Father        unknown   Heart disease Brother    Hypertension Brother    Cirrhosis Brother    Diabetes Daughter    Stroke Daughter    Kidney disease Daughter    Hypertension Son    Colon cancer Neg Hx    Colon polyps Neg Hx    Esophageal cancer Neg Hx    Stomach cancer Neg Hx    Rectal cancer Neg Hx    Inflammatory bowel disease Neg Hx    Liver disease Neg Hx    Pancreatic cancer Neg Hx    BP 122/80 (BP Location: Left Arm, Patient Position: Sitting, Cuff Size: Normal)   Pulse 76   Ht 5' 2 (1.575 m)   Wt 56 kg (123 lb 6.4 oz)   SpO2 96%   BMI 22.57 kg/m   Wt Readings from Last 3 Encounters:  10/15/23 56 kg (123 lb 6.4 oz)  09/21/23 56.7 kg (125 lb)  08/26/23 56.8 kg (125 lb 3.2 oz)   PHYSICAL EXAM: General:  NAD. No resp difficulty, walked into clinic, elderly HEENT: Normal Neck: Supple. No JVD. Carotids 2+ bilat; no bruits. No lymphadenopathy or thryomegaly appreciated. Cor: PMI nondisplaced. Regular rate & rhythm. No rubs, gallops  or murmurs. Lungs: Clear Abdomen: Soft, nontender, nondistended. No hepatosplenomegaly. No bruits or masses. Good bowel sounds. Extremities: No cyanosis, clubbing, rash, edema Neuro: Alert & oriented x 3, cranial nerves grossly intact. Moves all 4 extremities w/o difficulty. Affect pleasant.  ECG (personally reviewed): appears NSR, rBBB 75 bpm  ASSESSMENT & PLAN: Chronic Biventricular Heart Failure - Etiology uncertain, possible ischemic CM given known h/o CAD, though no recent CP. Remote inferior MI in 2000 w/ RCA PCI. Cath report unavailable, uncertain if any additional disease  - Echo (2019): showed normal LVEF, 55-60%, and normal RV - drop in EF 7/22, down to 25-30% w/ mod-severely reduced RV, in the setting of severe illness from septic shock, hypoxic respiratory failure, in setting of rt lung empyema and esophageal perforation requiring emergency surgery, c/b perioperative PE and NSTEMI (medically managed in setting of severe illness) - Echo (3/23): EF 25-30%, RV moderately reduced. - Echo 06/02/22 EF 30-35% previous inferior, inferolateral MI - NYHA II-early III, functional class difficult due to deconditioning. Volume okay on exam  -  Fatigue likely d/t combination of heart failure and deconditioning. Will hold off on CPX as she would not be a candidate for advanced therapies. - Continue Lasix  40 mg daily. - Continue spiro 12.5 mg daily. - Continue Entresto  24/26 mg bid. - Continue Toprol  XL 12.5 mg daily.  - Continue Imdur  30 mg daily. - Intolerant of SGLT2i due to UTIs (previously tried Jardiance ). - Did not titrate meds further d/t orthostatic dizziness + advanced age  - Not ICD candidate due to advanced age. - Labs today. - Update echo   2. CAD  - h/o CAD, s/p remote inferior wall MI in 2000 treated w/ PCI to RCA - NST 2019 c/w prior inferior myocardial infarction. Small mild distal anterior/apical infarct with mild peri-infarct ischemia>> Low risk study.  - Admit 6/23 with  CP. Lexiscan  (6/23) showed EF 25%, large fixed defect involving apical and inferior myocardium consistent with old infarction.  - Patient followed by Dr. Levern, medical management for now. - Stop ASA with need for [facility] - Continue statin, Imdur  and ? blocker.   3. PAF - NSR on ECG today - Continue Toprol  XL.  - Continue Eliquis  2.5 mg bid (appropriate dose for age and weight), no bleeding issues.   4. H/o PE - On Eliquis .  No bleeding issues - CBC today.   5. Hypertension  - BP well controlled Would not be more aggressive with management d/t orthostatic dizziness - GDMT as above.   6. Type 2DM  - A1c 6.4 912/24) - Managed by PCP - On insulin . - Failed SGLT2i due to frequent UTIs   7. Fatigue - Likely multifactorial - Check CBC and iron  panel   Follow up 2-3 months with Dr. Cherrie  Harlene CHRISTELLA Gainer, FNP  2:32 PM

## 2023-10-15 NOTE — Patient Instructions (Addendum)
 Thank you for coming in today  If you had labs drawn today, any labs that are abnormal the clinic will call you No news is good news  Medications: Stop Aspirin    Follow up appointments:  Your physician recommends that you schedule a follow-up appointment in:  2-3 months With Dr. Cherrie  Your physician has requested that you have an echocardiogram. Echocardiography is a painless test that uses sound waves to create images of your heart. It provides your doctor with information about the size and shape of your heart and how well your heart's chambers and valves are working. This procedure takes approximately one hour. There are no restrictions for this procedure.      Do the following things EVERYDAY: Weigh yourself in the morning before breakfast. Write it down and keep it in a log. Take your medicines as prescribed Eat low salt foods--Limit salt (sodium) to 2000 mg per day.  Stay as active as you can everyday Limit all fluids for the day to less than 2 liters   At the [facility], you and your health needs are our priority. As part of our continuing mission to provide you with exceptional heart care, we have created designated Provider Care Teams. These Care Teams include your primary Cardiologist (physician) and Advanced Practice Providers (APPs- Physician Assistants and Nurse Practitioners) who all work together to provide you with the care you need, when you need it.   You may see any of the following providers on your designated Care Team at your next follow up: Dr Toribio Cherrie Dr Ezra Shuck Dr. Ria Gardenia Greig Lenetta, NP Caffie Shed, [place] [facility], [place] Beckey Coe, NP Tinnie Redman, PharmD   Please be sure to bring in all your medications bottles to every appointment.    Thank you for choosing [facility]  If you have any questions or concerns before your next appointment  please send us  a message through [place] or call our office at [phone].    TO LEAVE A MESSAGE FOR THE NURSE SELECT OPTION 2, PLEASE LEAVE A MESSAGE INCLUDING: YOUR NAME DATE OF BIRTH CALL BACK NUMBER REASON FOR CALL**this is important as we prioritize the call backs  YOU WILL RECEIVE A CALL BACK THE SAME DAY AS LONG AS YOU CALL BEFORE 4:00 PM

## 2023-10-19 ENCOUNTER — Other Ambulatory Visit (HOSPITAL_COMMUNITY): Payer: Self-pay

## 2023-10-19 ENCOUNTER — Telehealth (HOSPITAL_COMMUNITY): Payer: Self-pay

## 2023-10-19 DIAGNOSIS — E611 Iron deficiency: Secondary | ICD-10-CM | POA: Insufficient documentation

## 2023-10-19 NOTE — Telephone Encounter (Signed)
-----   Message from [place] sent at 10/15/2023  4:28 PM EST ----- Iron and Tsats are low, this could be contributing to her fatigue. Please arrange iron infusion, if she is agreeable.

## 2023-10-19 NOTE — Telephone Encounter (Signed)
 Spoke with patient regarding the following results. Patient made aware and patient verbalized understanding.   Order for Iron infusion placed- forwarded to be scheduled/pre certed with insurance

## 2023-10-20 ENCOUNTER — Other Ambulatory Visit: Payer: Self-pay | Admitting: Student

## 2023-10-20 ENCOUNTER — Other Ambulatory Visit: Payer: Self-pay | Admitting: Gastroenterology

## 2023-10-20 DIAGNOSIS — E785 Hyperlipidemia, unspecified: Secondary | ICD-10-CM

## 2023-10-21 ENCOUNTER — Ambulatory Visit (HOSPITAL_COMMUNITY)
Admission: EM | Admit: 2023-10-21 | Discharge: 2023-10-21 | Disposition: A | Discharge status: Home / Self Care | Payer: 59 | Attending: Family Medicine | Admitting: Family Medicine

## 2023-10-21 ENCOUNTER — Encounter (HOSPITAL_COMMUNITY): Payer: Self-pay

## 2023-10-21 DIAGNOSIS — R531 Weakness: Secondary | ICD-10-CM | POA: Diagnosis not present

## 2023-10-21 DIAGNOSIS — R5383 Other fatigue: Secondary | ICD-10-CM | POA: Insufficient documentation

## 2023-10-21 DIAGNOSIS — D509 Iron deficiency anemia, unspecified: Secondary | ICD-10-CM | POA: Diagnosis not present

## 2023-10-21 LAB — POCT URINALYSIS DIP (MANUAL ENTRY)
Bilirubin, UA: NEGATIVE
Blood, UA: NEGATIVE
Glucose, UA: NEGATIVE mg/dL
Ketones, POC UA: NEGATIVE mg/dL
Nitrite, UA: NEGATIVE
Protein Ur, POC: NEGATIVE mg/dL
Spec Grav, UA: 1.01 (ref 1.010–1.025)
Urobilinogen, UA: 0.2 U/dL
pH, UA: 7 (ref 5.0–8.0)

## 2023-10-21 LAB — IRON AND TIBC
Iron: 65 ug/dL (ref 28–170)
Saturation Ratios: 18 % (ref 10.4–31.8)
TIBC: 371 ug/dL (ref 250–450)
UIBC: 306 ug/dL

## 2023-10-21 LAB — COMPREHENSIVE METABOLIC PANEL
ALT: 27 U/L (ref 0–44)
AST: 36 U/L (ref 15–41)
Albumin: 3.9 g/dL (ref 3.5–5.0)
Alkaline Phosphatase: 59 U/L (ref 38–126)
Anion gap: 10 (ref 5–15)
BUN: 13 mg/dL (ref 8–23)
CO2: 25 mmol/L (ref 22–32)
Calcium: 9.6 mg/dL (ref 8.9–10.3)
Chloride: 105 mmol/L (ref 98–111)
Creatinine, Ser: 1.01 mg/dL — ABNORMAL HIGH (ref 0.44–1.00)
GFR, Estimated: 56 mL/min — ABNORMAL LOW (ref >60)
Glucose, Bld: 134 mg/dL — ABNORMAL HIGH (ref 70–99)
Potassium: 4.1 mmol/L (ref 3.5–5.1)
Sodium: 140 mmol/L (ref 135–145)
Total Bilirubin: 0.5 mg/dL (ref 0.0–1.2)
Total Protein: 7.6 g/dL (ref 6.5–8.1)

## 2023-10-21 LAB — CBC WITH DIFFERENTIAL/PLATELET
Abs Immature Granulocytes: 0.01 10*3/uL (ref 0.00–0.07)
Basophils Absolute: 0 10*3/uL (ref 0.0–0.1)
Basophils Relative: 0 %
Eosinophils Absolute: 0 10*3/uL (ref 0.0–0.5)
Eosinophils Relative: 0 %
HCT: 37.2 % (ref 36.0–46.0)
Hemoglobin: 11.9 g/dL — ABNORMAL LOW (ref 12.0–15.0)
Immature Granulocytes: 0 %
Lymphocytes Relative: 32 %
Lymphs Abs: 1.7 10*3/uL (ref 0.7–4.0)
MCH: 30.7 pg (ref 26.0–34.0)
MCHC: 32 g/dL (ref 30.0–36.0)
MCV: 96.1 fL (ref 80.0–100.0)
Monocytes Absolute: 0.5 10*3/uL (ref 0.1–1.0)
Monocytes Relative: 10 %
Neutro Abs: 3 10*3/uL (ref 1.7–7.7)
Neutrophils Relative %: 58 %
Platelets: 135 10*3/uL — ABNORMAL LOW (ref 150–400)
RBC: 3.87 MIL/uL (ref 3.87–5.11)
RDW: 13.3 % (ref 11.5–15.5)
WBC: 5.2 10*3/uL (ref 4.0–10.5)
nRBC: 0 % (ref 0.0–0.2)

## 2023-10-21 LAB — MAGNESIUM: Magnesium: 2.3 mg/dL (ref 1.7–2.4)

## 2023-10-21 LAB — POCT FASTING CBG KUC MANUAL ENTRY: POCT Glucose (KUC): 154 mg/dL — AB (ref 70–99)

## 2023-10-21 LAB — VITAMIN B12: Vitamin B-12: <50 pg/mL — ABNORMAL LOW (ref 180–914)

## 2023-10-21 MED ORDER — SLOW IRON 160 (50 FE) MG PO TBCR: 1.0000 | EXTENDED_RELEASE_TABLET | Freq: Every day | ORAL | 0 refills | Status: AC

## 2023-10-21 NOTE — Discharge Instructions (Addendum)
 You were seen today for continued weakness and fatigue.  Your urine and blood sugar looked normal today.  I have ordered blood work for further monitoring of your iron . These labs will be resulted tomorrow and if there is anything more concerning we will notify you.   I have sent out a script for iron  in the mean time, until you can get your iron  infusion.  IF this is not covered under insurance you may need to buy this over the counter.  If you have worsening symptoms of weakness/fatigue then please go to the ER for further evaluation.

## 2023-10-21 NOTE — ED Triage Notes (Signed)
 Pt reports fatigue and weakness that started 3 days. Patient was seen by PCP.According to her records she needs an iron infusion.

## 2023-10-21 NOTE — ED Provider Notes (Signed)
 [facility]    CSN: [id number] Arrival date & time: 10/21/23  1313      History   Chief Complaint Chief Complaint  Patient presents with   Fatigue   Weakness    HPI Dana  LELON Coleman is a 83 y.o. female.    Weakness Associated symptoms: no fever    Patient is here for weakness/fatigue.  She has been feeling poorly for a while, but worsening.  Saw cardiology on 10/15/23, lab work done showing low iron .  Recommended iron  infusions, and she is waiting on insurance to approve.  She is here today b/c she states she feels worse overall.  She does not currently take iron  supplements.   Also dizziness and sob, and feels this has been worsening.  She is eating/drinking okay, although this morning she only ate cereal and a banana.  No lunch today.  Slightly decreased appetite today.  No urinary symptoms.  No diarrhea, no blood in her stool.  She recently had an EGD, and was normal.  A few day ago she had chills, but no fever.  She had a cough last week, but she is over that.        Past Medical History:  Diagnosis Date   Acute pulmonary edema (HCC)    Acute respiratory failure with hypoxemia (HCC)    Acute respiratory failure with hypoxia (HCC)    Allergy    occ uses OTC allergy meds    Breast cancer (HCC) 04/2008   Right breast   Cataracts, bilateral    removed bilat    Cervical cancer (HCC)    When the patient was in her 30s   Depression    Empyema (HCC)    Esophageal perforation 05/05/2021   Failure to thrive in adult 07/22/2021   Wt Readings from Last 3 Encounters: 06/26/21 65.8 kg 06/17/21 64.8 kg 04/26/21 58.1 kg     Gastrostomy tube in place [facility]) 08/21/2021   Heart attack (HCC) 2004   History of cervical cancer    S/p radiation and surgical removal in 30's.  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 Melodye   PEG (percutaneous endoscopic gastrostomy) status (HCC) 07/23/2021   Personal history of radiation therapy 2009   Pressure injury of skin 05/05/2021   Protein-calorie malnutrition, severe (HCC) 05/15/2021   Pyelonephritis 11/06/2021   S/P thoracotomy     Patient Active Problem List   Diagnosis Date Noted   Iron  deficiency 10/19/2023   Gastritis and gastroduodenitis 09/21/2023   Gastric polyps 09/21/2023   Abnormal barium swallow 07/16/2023   Pyrosis 05/23/2023   Dysphagia 05/23/2023   Constipation 05/23/2023   Change in bowel habits 05/23/2023   Hx of adenomatous colonic polyps 05/23/2023   Unresolved grief 01/15/2023   Orthostatic hypotension    Stage 3a chronic kidney disease (CKD) (HCC) 03/24/2022   Paroxysmal atrial fibrillation (HCC) 03/24/2022   Insomnia 03/24/2022   Chest pain 03/23/2022   Rib pain on left side 01/06/2022   Acute exacerbation of CHF (congestive heart failure) (HCC) 12/29/2021   Hematuria 11/06/2021   Abdominal pain    Dyspnea on exertion    Impaired functional mobility, balance, gait, and endurance 05/25/2021   NSTEMI (non-ST elevated myocardial  infarction) (HCC) 05/23/2021   HFrEF (heart failure with reduced ejection fraction) (HCC) 05/23/2021   Acute pulmonary embolism (HCC)    Esophageal perforation 05/05/2021   GERD (gastroesophageal reflux disease) 12/19/2020   Breast lump 12/07/2018   Type 2 diabetes mellitus with other specified complication (HCC) 08/14/2014   Debility 06/30/2011   History of thyroid  nodule 01/16/2011   Hyperlipidemia    Hypertension    CAD (coronary artery disease), native coronary artery     Past Surgical History:  Procedure Laterality Date   BIOPSY  09/21/2023   Procedure: BIOPSY;  Surgeon: Wilhelmenia Aloha Raddle., MD;  Location: [facility];  Service: Gastroenterology;;   BREAST LUMPECTOMY Right 2009   BREAST  SURGERY  2009   right lumpectomy   CERVIX SURGERY  1980   CHOLECYSTECTOMY N/A 05/02/2021   Procedure: LAPAROSCOPIC CHOLECYSTECTOMY;  Surgeon: Tanda Locus, MD;  Location: [facility];  Service: General;  Laterality: N/A;   COLONOSCOPY     ESOPHAGOGASTRODUODENOSCOPY (EGD) WITH PROPOFOL  N/A 09/21/2023   Procedure: ESOPHAGOGASTRODUODENOSCOPY (EGD) WITH PROPOFOL ;  Surgeon: Wilhelmenia Aloha Raddle., MD;  Location: [facility];  Service: Gastroenterology;  Laterality: N/A;   IR GASTROSTOMY TUBE MOD SED  06/03/2021   IR GASTROSTOMY TUBE REMOVAL  10/08/2021   LEFT HEART CATHETERIZATION WITH CORONARY ANGIOGRAM N/A 04/20/2014   Procedure: LEFT HEART CATHETERIZATION WITH CORONARY ANGIOGRAM;  Surgeon: Elsie GORMAN Somerset, MD;  Location: [facility] CATH LAB;  Service: Cardiovascular;  Laterality: N/A;   POLYPECTOMY     POLYPECTOMY  09/21/2023   Procedure: POLYPECTOMY;  Surgeon: Wilhelmenia Aloha Raddle., MD;  Location: [facility];  Service: Gastroenterology;;   HARLEY DILATION N/A 09/21/2023   Procedure: HARLEY HODGKIN;  Surgeon: Wilhelmenia Aloha Raddle., MD;  Location: [facility];  Service: Gastroenterology;  Laterality: N/A;   STENT PLACEMENT VASCULAR ([facility] HX)     THORACOTOMY Right 05/05/2021   Procedure: THORACOTOMY MAJOR REPAIR PERFORATED ESOPHAGUS;  Surgeon: Lucas Dorise POUR, MD;  Location: [facility];  Service: Thoracic;  Laterality: Right;   thyroid      for nodule hemithyroidectomy, benign   THYROIDECTOMY Right 03/2003    OB History   No obstetric history on file.      Home Medications    Prior to Admission medications   Medication Sig Start Date End Date Taking? Authorizing Provider  apixaban  (ELIQUIS ) 2.5 MG TABS tablet Take 1 tablet (2.5 mg total) by mouth 2 (two) times daily. 09/23/23  Yes Mansouraty, Aloha Raddle., MD  Dulaglutide  (TRULICITY ) 1.5 MG/0.5ML SOPN Inject 1.5 mg into the skin once a week. 04/27/23  Yes Cleotilde Perkins, DO  ENTRESTO  24-26 MG TAKE 1 TABLET BY MOUTH TWICE A DAY 08/06/23  Yes Cleotilde Perkins, DO  fluticasone  (FLONASE ) 50 MCG/ACT nasal spray Place 1 spray into both nostrils daily as needed for allergies or rhinitis. 1 spray in each nostril every day 09/09/22  Yes Austin Ade, MD  insulin  glargine (LANTUS  SOLOSTAR) 100 UNIT/ML Solostar Pen INJECT 10 UNITS INTO THE SKIN DAILY 06/08/23  Yes Cleotilde Perkins, DO  Melatonin 3 MG TBDP Take 6 mg by mouth at bedtime as needed (sleep). 07/20/21  Yes [provider]  metoprolol  succinate (TOPROL -XL) 25 MG 24 hr tablet TAKE 1/2 TABLET BY MOUTH DAILY 03/16/23  Yes Bensimhon, Toribio SAUNDERS, MD  nitroGLYCERIN  (NITROSTAT ) 0.4 MG SL tablet Place 0.4 mg under the tongue every 5 (five) minutes as needed for chest pain.   Yes [provider]  rosuvastatin  (CRESTOR ) 20 MG tablet TAKE 1 TABLET BY MOUTH EVERYDAY AT BEDTIME 10/20/23  Yes Cleotilde Perkins, DO  traZODone  (DESYREL ) 50 MG tablet TAKE 1 TABLET BY MOUTH EVERY DAY AT BEDTIME AS NEEDED FOR SLEEP 07/06/23  Yes Cleotilde Perkins, DO  acetaminophen  (TYLENOL ) 500 MG tablet Take 500 mg by mouth every 6 (six) hours as needed for moderate pain.    [provider]  ascorbic acid (VITAMIN C) 500 MG tablet Take 500 mg by mouth daily.    [provider]  bisacodyl (DULCOLAX) 5 MG EC tablet Take 5 mg by mouth every other day.    [provider]  Blood Glucose Monitoring Suppl MISC daily.    [provider]  erythromycin ophthalmic ointment Place 1 Application into both eyes 4 (four) times daily. 07/06/23   [provider]  furosemide  (LASIX ) 80 MG tablet Take 1 tablet (80 mg total) by mouth daily. 01/05/23   Clegg, Amy D, NP  Insulin  Pen Needle (B-D UF III MINI PEN NEEDLES) 31G X 5 MM MISC INJECT INSULIN  VIA PEN 6 TIMES DAILY 02/22/18   Lanny Maxwell, MD  isosorbide  mononitrate (IMDUR ) 30 MG 24 hr tablet TAKE 1 TABLET BY MOUTH EVERY DAY 06/29/23   Bensimhon, Toribio SAUNDERS, MD  Lancets [facility] DELICA PLUS [place]) MISC APPLY ROUTE AS NEEDED. 10/03/22   Cleotilde Perkins, DO   linaclotide (LINZESS) 72 MCG capsule Take 72 mcg by mouth daily before breakfast.    [provider]  omeprazole  (PRILOSEC) 20 MG capsule Take 2 capsules (40 mg total) by mouth every morning. Patient not taking: Reported on 10/15/2023 10/22/23   Mansouraty, Aloha Raddle., MD  omeprazole  (PRILOSEC) 40 MG capsule TAKE 1 CAPSULE (40 MG TOTAL) BY MOUTH 2 (TWO) TIMES DAILY BEFORE A MEAL. 10/13/23   Mansouraty, Aloha Raddle., MD  [facility] ULTRA test strip CHECK BLOOD SUGAR TWICE DAILY 08/06/23   Cleotilde Perkins, DO  polycarbophil (FIBERCON) 625 MG tablet Take 625 mg by mouth daily.    [provider]  spironolactone  (ALDACTONE ) 25 MG tablet TAKE 1/2 TABLET BY MOUTH EVERY DAY 12/02/22   [place], Harlene HERO, FNP    Family History Family History  Problem Relation Age of Onset   Heart disease Mother        age 63   Heart disease Father    Cancer Father        unknown   Heart disease Brother    Hypertension Brother    Cirrhosis Brother    Diabetes Daughter    Stroke Daughter    Kidney disease Daughter    Hypertension Son    Colon cancer Neg Hx    Colon polyps Neg Hx    Esophageal cancer Neg Hx    Stomach cancer Neg Hx    Rectal cancer Neg Hx    Inflammatory bowel disease Neg Hx    Liver disease Neg Hx    Pancreatic cancer Neg Hx     Social History Social History   Tobacco Use   Smoking status: Never   Smokeless tobacco: Never   Tobacco comments:    No plans to start  Vaping Use   Vaping status: Never Used  Substance Use Topics   Alcohol use: No   Drug use: No     Allergies   Patient has no known allergies.   Review of Systems Review of Systems  Constitutional:  Positive for appetite change and fatigue. Negative for chills and fever.  HENT: Negative.    Respiratory: Negative.    Cardiovascular: Negative.   Gastrointestinal: Negative.   Genitourinary: Negative.  Musculoskeletal: Negative.   Neurological:  Positive for weakness.  Psychiatric/Behavioral:  Negative.       Physical Exam Triage Vital Signs ED Triage Vitals [10/21/23 1439]  Encounter Vitals Group     BP 121/72     Systolic BP Percentile      Diastolic BP Percentile      Pulse Rate 94     Resp 16     Temp 99 F (37.2 C)     Temp Source Oral     SpO2 94 %     Weight      Height      Head Circumference      Peak Flow      Pain Score      Pain Loc      Pain Education      Exclude from Growth Chart    No data found.  Updated Vital Signs BP 121/72 (BP Location: Left Arm)   Pulse 94   Temp 99 F (37.2 C) (Oral)   Resp 16   SpO2 94%   Visual Acuity Right Eye Distance:   Left Eye Distance:   Bilateral Distance:    Right Eye Near:   Left Eye Near:    Bilateral Near:     Physical Exam Constitutional:      General: She is not in acute distress.    Appearance: Normal appearance. She is obese. She is not ill-appearing.     Comments: Appears tired  HENT:     Nose: Nose normal.     Mouth/Throat:     Mouth: Mucous membranes are dry.  Cardiovascular:     Rate and Rhythm: Normal rate and regular rhythm.  Pulmonary:     Effort: Pulmonary effort is normal.     Breath sounds: Normal breath sounds.  Abdominal:     Palpations: Abdomen is soft.     Tenderness: There is abdominal tenderness. There is rebound. There is no guarding.  Musculoskeletal:     Cervical back: Normal range of motion and neck supple. No tenderness.  Skin:    General: Skin is warm.  Neurological:     General: No focal deficit present.     Mental Status: She is alert and oriented to person, place, and time.  Psychiatric:        Mood and Affect: Mood normal.        Behavior: Behavior normal.      UC Treatments / Results  Labs (all labs ordered are listed, but only abnormal results are displayed) Labs Reviewed  POCT URINALYSIS DIP (MANUAL ENTRY) - Abnormal; Notable for the following components:      Result Value   Leukocytes, UA Trace (*)    All other components within normal  limits  POCT FASTING CBG KUC MANUAL ENTRY    EKG   Radiology No results found.  Procedures Procedures (including critical care time)  Medications Ordered in UC Medications - No data to display  Initial Impression / Assessment and Plan / UC Course  I have reviewed the triage vital signs and the nursing notes.  Pertinent labs & imaging results that were available during my care of the patient were reviewed by me and considered in my medical decision making (see chart for details).   Final Clinical Impressions(s) / UC Diagnoses   Final diagnoses:  Weakness  Other fatigue  Iron  deficiency anemia, unspecified iron  deficiency anemia type     Discharge Instructions      You were seen  today for continued weakness and fatigue.  Your urine and blood sugar looked normal today.  I have ordered blood work for further monitoring of your iron . These labs will be resulted tomorrow and if there is anything more concerning we will notify you.   I have sent out a script for iron  in the mean time, until you can get your iron  infusion.  If you have worsening symptoms of weakness/fatigue then please go to the ER for further evaluation.     ED Prescriptions     Medication Sig Dispense Auth. Provider   ferrous sulfate (SLOW IRON ) 160 (50 Fe) MG TBCR SR tablet Take 1 tablet (160 mg total) by mouth daily. 30 tablet Darral Longs, MD      PDMP not reviewed this encounter.   Darral Longs, MD 10/21/23 1517

## 2023-10-22 ENCOUNTER — Telehealth: Payer: Self-pay | Admitting: Internal Medicine

## 2023-10-22 LAB — URINE CULTURE: Culture: NO GROWTH

## 2023-10-22 MED ORDER — VITAMIN B-12 1000 MCG PO TABS: 1000.0000 ug | ORAL_TABLET | Freq: Every day | ORAL | 0 refills | Status: AC

## 2023-10-22 NOTE — Telephone Encounter (Signed)
 Vitamin b12 <50. Given history of anemia requiring iron fusion and fatigue/weakness, RX was sent.

## 2023-10-27 NOTE — Telephone Encounter (Signed)
Scheduled, Patient advised and verbalized understanding.

## 2023-10-29 ENCOUNTER — Ambulatory Visit (HOSPITAL_COMMUNITY)
Admission: RE | Admit: 2023-10-29 | Discharge: 2023-10-29 | Disposition: A | Discharge status: Home / Self Care | Payer: 59 | Source: Ambulatory Visit | Attending: Internal Medicine | Admitting: Internal Medicine

## 2023-10-29 ENCOUNTER — Encounter: Payer: Self-pay | Admitting: *Deleted

## 2023-10-29 ENCOUNTER — Other Ambulatory Visit: Payer: Self-pay | Admitting: Student

## 2023-10-29 DIAGNOSIS — E611 Iron deficiency: Secondary | ICD-10-CM

## 2023-10-29 DIAGNOSIS — I4891 Unspecified atrial fibrillation: Secondary | ICD-10-CM | POA: Diagnosis not present

## 2023-10-29 DIAGNOSIS — E108 Type 1 diabetes mellitus with unspecified complications: Secondary | ICD-10-CM

## 2023-10-29 DIAGNOSIS — E1122 Type 2 diabetes mellitus with diabetic chronic kidney disease: Secondary | ICD-10-CM | POA: Diagnosis not present

## 2023-10-29 DIAGNOSIS — I130 Hypertensive heart and chronic kidney disease with heart failure and stage 1 through stage 4 chronic kidney disease, or unspecified chronic kidney disease: Secondary | ICD-10-CM | POA: Insufficient documentation

## 2023-10-29 DIAGNOSIS — N1831 Chronic kidney disease, stage 3a: Secondary | ICD-10-CM | POA: Diagnosis not present

## 2023-10-29 DIAGNOSIS — I252 Old myocardial infarction: Secondary | ICD-10-CM | POA: Diagnosis not present

## 2023-10-29 DIAGNOSIS — I5022 Chronic systolic (congestive) heart failure: Secondary | ICD-10-CM

## 2023-10-29 DIAGNOSIS — I5020 Unspecified systolic (congestive) heart failure: Secondary | ICD-10-CM | POA: Insufficient documentation

## 2023-10-29 DIAGNOSIS — I2510 Atherosclerotic heart disease of native coronary artery without angina pectoris: Secondary | ICD-10-CM | POA: Diagnosis not present

## 2023-10-29 DIAGNOSIS — E1169 Type 2 diabetes mellitus with other specified complication: Secondary | ICD-10-CM

## 2023-10-29 DIAGNOSIS — Z006 Encounter for examination for normal comparison and control in clinical research program: Secondary | ICD-10-CM

## 2023-10-29 LAB — ECHOCARDIOGRAM COMPLETE
AR max vel: 2.55 cm2
AV Area VTI: 2.3 cm2
AV Area mean vel: 2.56 cm2
AV Mean grad: 2 mm[Hg]
AV Peak grad: 3.2 mm[Hg]
Ao pk vel: 0.9 m/s
Area-P 1/2: 4.1 cm2
Calc EF: 46.9 %
S' Lateral: 4 cm
Single Plane A2C EF: 46.1 %
Single Plane A4C EF: 44.2 %
Weight: 1920 [oz_av]

## 2023-10-29 MED ORDER — SODIUM CHLORIDE 0.9 % IV SOLN
510.0000 mg | Freq: Once | INTRAVENOUS | Status: AC
Start: 2023-10-29 — End: 2023-10-29
  Administered 2023-10-29: 510 mg via INTRAVENOUS
  Filled 2023-10-29: qty 510

## 2023-10-29 MED ORDER — FERUMOXYTOL INJECTION 510 MG/17 ML
510.0000 mg | Freq: Once | INTRAVENOUS | Status: DC
Start: 2023-10-29 — End: 2023-10-29

## 2023-10-29 NOTE — Research (Signed)
SITE: [id number]     Subject #[id number]   Subprotocol: A  Inclusion Criteria  Patients who meet all of the following criteria are eligible for enrollment as study participants:  Yes No  Age > 82 years old X   Eligible to wear Holter Study X    Exclusion Criteria  Patients who meet any of these criteria are not eligible for enrollment as study participants: Yes No  1. Receiving any mechanical (respiratory or circulatory) or renal support therapy at Screening or during Visit #1.  X  2.  Any other conditions that in the opinion of the investigators are likely to prevent compliance with the study protocol or pose a safety concern if the subject participates in the study.  X  3. Poor tolerance, namely susceptible to severe skin allergies from ECG adhesive patch application.  X   Protocol: REV H                                     Residential Zip code*  [address] (First 3 digits ONLY)                                             [employer] Informed Consent   Subject Name: [name]  Subject met inclusion and exclusion criteria.  The informed consent form, study requirements and expectations were reviewed with the subject. Subject had opportunity to read consent and questions and concerns were addressed prior to the signing of the consent form.  The subject verbalized understanding of the trial requirements.  The subject agreed to participate in the EF ACT trial and signed the informed consent at 320 on 10/29/23.  The informed consent was obtained prior to performance of any protocol-specific procedures for the subject.  A copy of the signed informed consent was given to the subject and a copy was placed in the subject's medical record.   Dana Coleman          Current Outpatient Medications:    apixaban (ELIQUIS) 2.5 MG TABS tablet, Take 1 tablet (2.5 mg total) by mouth 2 (two) times daily., Disp: , Rfl:    ascorbic acid (VITAMIN C) 500 MG tablet, Take 500 mg by mouth daily., Disp: , Rfl:     bisacodyl (DULCOLAX) 5 MG EC tablet, Take 5 mg by mouth every other day., Disp: , Rfl:    Blood Glucose Monitoring Suppl MISC, daily., Disp: , Rfl:    cyanocobalamin (VITAMIN B12) 1000 MCG tablet, Take 1 tablet (1,000 mcg total) by mouth daily., Disp: 30 tablet, Rfl: 0   Dulaglutide (TRULICITY) 1.5 MG/0.5ML SOPN, Inject 1.5 mg into the skin once a week., Disp: 2 mL, Rfl: 5   ENTRESTO 24-26 MG, TAKE 1 TABLET BY MOUTH TWICE A DAY, Disp: 60 tablet, Rfl: 2   erythromycin ophthalmic ointment, Place 1 Application into both eyes 4 (four) times daily., Disp: , Rfl:    ferrous sulfate (SLOW IRON) 160 (50 Fe) MG TBCR SR tablet, Take 1 tablet (160 mg total) by mouth daily., Disp: 30 tablet, Rfl: 0   fluticasone (FLONASE) 50 MCG/ACT nasal spray, Place 1 spray into both nostrils daily as needed for allergies or rhinitis. 1 spray in each nostril every day, Disp: 16 g, Rfl: 12   furosemide (LASIX) 80 MG tablet, Take 1  tablet (80 mg total) by mouth daily., Disp: 30 tablet, Rfl: 11   insulin glargine (LANTUS SOLOSTAR) 100 UNIT/ML Solostar Pen, INJECT 10 UNITS INTO THE SKIN DAILY, Disp: 15 mL, Rfl: 3   Insulin Pen Needle (B-D UF III MINI PEN NEEDLES) 31G X 5 MM MISC, INJECT INSULIN VIA PEN 6 TIMES DAILY, Disp: 200 each, Rfl: 3   Lancets (ONETOUCH DELICA PLUS LANCET33G) MISC, APPLY ROUTE AS NEEDED., Disp: 100 each, Rfl: PRN   linaclotide (LINZESS) 72 MCG capsule, Take 72 mcg by mouth daily before breakfast., Disp: , Rfl:    Melatonin 3 MG TBDP, Take 6 mg by mouth at bedtime as needed (sleep)., Disp: , Rfl:    metoprolol succinate (TOPROL-XL) 25 MG 24 hr tablet, TAKE 1/2 TABLET BY MOUTH DAILY, Disp: 45 tablet, Rfl: 3   nitroGLYCERIN (NITROSTAT) 0.4 MG SL tablet, Place 0.4 mg under the tongue every 5 (five) minutes as needed for chest pain., Disp: , Rfl:    omeprazole (PRILOSEC) 40 MG capsule, TAKE 1 CAPSULE (40 MG TOTAL) BY MOUTH 2 (TWO) TIMES DAILY BEFORE A MEAL., Disp: 180 capsule, Rfl: 1   ONETOUCH ULTRA test strip,  CHECK BLOOD SUGAR TWICE DAILY, Disp: 100 strip, Rfl: 1   rosuvastatin (CRESTOR) 20 MG tablet, TAKE 1 TABLET BY MOUTH EVERYDAY AT BEDTIME, Disp: 90 tablet, Rfl: 1   traZODone (DESYREL) 50 MG tablet, TAKE 1 TABLET BY MOUTH EVERY DAY AT BEDTIME AS NEEDED FOR SLEEP, Disp: 90 tablet, Rfl: 1

## 2023-10-29 NOTE — Progress Notes (Signed)
*  PRELIMINARY RESULTS* Echocardiogram 2D Echocardiogram has been performed.  Dana Coleman 10/29/2023, 2:58 PM

## 2023-10-30 ENCOUNTER — Other Ambulatory Visit (HOSPITAL_COMMUNITY): Payer: Self-pay

## 2023-10-30 MED ORDER — ONETOUCH DELICA PLUS LANCET33G MISC
3 refills | Status: DC
  Filled 2023-10-30: qty 100, 50d supply, fill #0
  Filled 2024-01-05: qty 100, 50d supply, fill #1
  Filled 2024-02-17 – 2024-03-15 (×3): qty 100, 50d supply, fill #2

## 2023-10-30 MED ORDER — TRULICITY 1.5 MG/0.5ML ~~LOC~~ SOAJ
1.5000 mg | SUBCUTANEOUS | 5 refills | Status: DC
  Filled 2023-10-30: qty 2, 28d supply, fill #0
  Filled 2023-12-04: qty 2, 28d supply, fill #1
  Filled 2024-01-04: qty 2, 28d supply, fill #2
  Filled 2024-01-26: qty 2, 28d supply, fill #3
  Filled 2024-02-26 – 2024-03-10 (×2): qty 2, 28d supply, fill #4
  Filled 2024-04-08 – 2024-04-20 (×2): qty 2, 28d supply, fill #5

## 2023-11-02 ENCOUNTER — Telehealth (HOSPITAL_COMMUNITY): Payer: Self-pay

## 2023-11-02 NOTE — Telephone Encounter (Addendum)
Pt aware, agreeable, and verbalized understanding   ----- Message from Arvilla Meres sent at 11/01/2023 11:31 AM EST ----- EF improved

## 2023-11-03 ENCOUNTER — Other Ambulatory Visit: Payer: Self-pay | Admitting: Student

## 2023-11-03 ENCOUNTER — Other Ambulatory Visit: Payer: Self-pay | Admitting: Gastroenterology

## 2023-11-03 DIAGNOSIS — E1169 Type 2 diabetes mellitus with other specified complication: Secondary | ICD-10-CM

## 2023-11-09 ENCOUNTER — Encounter: Payer: Self-pay | Admitting: Gastroenterology

## 2023-11-09 ENCOUNTER — Telehealth: Payer: Self-pay

## 2023-11-09 NOTE — Telephone Encounter (Signed)
Received call from Herbert Seta, NP with [facility] regarding patient and abnormal screening.   Quantiflow- PVD screening abnormal.   LLE- abnormal 0.53 Asymptomatic, decreased pedal pulses on left  Normal right leg screening.   Scheduled patient for appointment to discuss this further on Wednesday, 11/11/23.  Veronda Prude, RN

## 2023-11-11 ENCOUNTER — Ambulatory Visit: Payer: 59 | Admitting: Student

## 2023-11-11 ENCOUNTER — Encounter: Payer: Self-pay | Admitting: Student

## 2023-11-11 VITALS — BP 130/70 | HR 63 | Ht 62.0 in | Wt 119.0 lb

## 2023-11-11 DIAGNOSIS — E1169 Type 2 diabetes mellitus with other specified complication: Secondary | ICD-10-CM | POA: Diagnosis not present

## 2023-11-11 DIAGNOSIS — I739 Peripheral vascular disease, unspecified: Secondary | ICD-10-CM | POA: Diagnosis not present

## 2023-11-11 NOTE — Progress Notes (Signed)
    SUBJECTIVE:   CHIEF COMPLAINT / HPI:   Dana Coleman is a 83 y.o. female  presenting for follow-up for neck abnormal peripheral vascular disease screening and diabetes.  She underwent a peripheral vascular disease screening through home health agency which was abnormal.  Quantiflow- PVD screening abnormal.    LLE- abnormal 0.53 Asymptomatic, decreased pedal pulses on left   Normal right leg screening.    She denies having pain, burning sensation, cramping in her lower extremities.  She does endorse weakness which has been progressive in nature.  She denies recent falls.  Type 2 Diabetes: Home medications include: Trulicity 1.5 mg weekly, Lantus 5 units daily. Does endorse compliance. Home glucose monitoring is performed regularly.   Most recent A1Cs:  Lab Results  Component Value Date   HGBA1C 6.4 08/26/2023   HGBA1C 7.9 (A) 01/15/2023   Last Microalbumin, LDL, Creatinine: Lab Results  Component Value Date   MICROALBUR 10 01/20/2012   LDLCALC 58 11/06/2021   CREATININE 1.01 (H) 10/21/2023    Patient is up to date on diabetic eye. Patient is up to date on diabetic foot exam.  PERTINENT  PMH / PSH: Reviewed and updated   OBJECTIVE:   BP 130/70   Pulse 63   Ht 5\' 2"  (1.575 m)   Wt 119 lb (54 kg)   SpO2 98%   BMI 21.77 kg/m   Well-appearing, no acute distress Cardio: Regular rate, regular rhythm, no murmurs on exam. Pulm: Clear, no wheezing, no crackles. No increased work of breathing Abdominal: bowel sounds present, soft, non-tender, non-distended Extremities: no peripheral edema, +2 DP bilaterally, well-perfused bilaterally Neuro: alert and oriented x3, speech normal in content, no facial asymmetry, strength intact and equal bilaterally in UE and LE, pupils equal and reactive to light.  Psych:  Cognition and judgment appear intact. Alert, communicative  and cooperative with normal attention span and concentration. No apparent delusions, illusions,  hallucinations      11/11/2023    9:16 AM 08/26/2023    9:54 AM 04/17/2023   12:06 PM  PHQ9 SCORE ONLY  PHQ-9 Total Score 3 2 0      ASSESSMENT/PLAN:   Peripheral artery disease (HCC) Per screening possible element of peripheral artery disease.  Will order a bilateral vascular duplex to assess.  Reassuringly patient has good pedal pulses and perfusion on exam.  Currently on 20 mg Crestor and anticoagulated with 2.5 mg Eliquis twice daily  Type 2 diabetes mellitus with other specified complication (HCC) Blood sugars have been within a good range at home when she checks them.  Continue current regimen of 1.5 mg Trulicity weekly and 5 units Lantus daily.  Instructed patient that if her morning fasting sugars get below 100 to call the office and let us know we can most likely discontinue her Lantus.     Glendale Chard, DO [facility]

## 2023-11-11 NOTE — Assessment & Plan Note (Signed)
Blood sugars have been within a good range at home when she checks them.  Continue current regimen of 1.5 mg Trulicity weekly and 5 units Lantus daily.  Instructed patient that if her morning fasting sugars get below 100 to call the office and let us know we can most likely discontinue her Lantus.

## 2023-11-11 NOTE — Assessment & Plan Note (Signed)
Per screening possible element of peripheral artery disease.  Will order a bilateral vascular duplex to assess.  Reassuringly patient has good pedal pulses and perfusion on exam.  Currently on 20 mg Crestor and anticoagulated with 2.5 mg Eliquis twice daily

## 2023-11-11 NOTE — Patient Instructions (Signed)
It was great to see you today!   I am ordering an ultrasound of your legs to check your blood flow. You will get a call to schedule this.   Future Appointments  Date Time Provider Department Center  12/15/2023  2:20 PM Bensimhon, Bevelyn Buckles, MD [facility] None  04/22/2024 11:15 AM [facility] ANNUAL WELLNESS VISIT [facility]    Please arrive 15 minutes before your appointment to ensure smooth check in process.    Please call the clinic at [phone] if your symptoms worsen or you have any concerns.  Thank you for allowing me to participate in your care, Dr. Glendale Chard [facility]

## 2023-11-22 ENCOUNTER — Other Ambulatory Visit (HOSPITAL_COMMUNITY): Payer: Self-pay | Admitting: Family Medicine

## 2023-12-04 ENCOUNTER — Other Ambulatory Visit (HOSPITAL_COMMUNITY): Payer: Self-pay

## 2023-12-07 ENCOUNTER — Other Ambulatory Visit: Payer: Self-pay | Admitting: Gastroenterology

## 2023-12-07 ENCOUNTER — Other Ambulatory Visit (HOSPITAL_COMMUNITY): Payer: Self-pay | Admitting: Adult Health

## 2023-12-07 DIAGNOSIS — N281 Cyst of kidney, acquired: Secondary | ICD-10-CM | POA: Diagnosis not present

## 2023-12-15 ENCOUNTER — Ambulatory Visit (HOSPITAL_COMMUNITY)
Admission: RE | Admit: 2023-12-15 | Discharge: 2023-12-15 | Disposition: A | Discharge status: Home / Self Care | Payer: 59 | Source: Ambulatory Visit | Attending: Internal Medicine | Admitting: Internal Medicine

## 2023-12-15 ENCOUNTER — Encounter (HOSPITAL_COMMUNITY): Payer: Self-pay | Admitting: Internal Medicine

## 2023-12-15 VITALS — BP 110/70 | HR 78 | Wt 119.0 lb

## 2023-12-15 DIAGNOSIS — Z853 Personal history of malignant neoplasm of breast: Secondary | ICD-10-CM | POA: Diagnosis not present

## 2023-12-15 DIAGNOSIS — Z9011 Acquired absence of right breast and nipple: Secondary | ICD-10-CM | POA: Diagnosis not present

## 2023-12-15 DIAGNOSIS — I480 Paroxysmal atrial fibrillation: Secondary | ICD-10-CM | POA: Diagnosis not present

## 2023-12-15 DIAGNOSIS — Z7901 Long term (current) use of anticoagulants: Secondary | ICD-10-CM | POA: Diagnosis not present

## 2023-12-15 DIAGNOSIS — I10 Essential (primary) hypertension: Secondary | ICD-10-CM

## 2023-12-15 DIAGNOSIS — Z955 Presence of coronary angioplasty implant and graft: Secondary | ICD-10-CM | POA: Insufficient documentation

## 2023-12-15 DIAGNOSIS — I2510 Atherosclerotic heart disease of native coronary artery without angina pectoris: Secondary | ICD-10-CM

## 2023-12-15 DIAGNOSIS — Z86711 Personal history of pulmonary embolism: Secondary | ICD-10-CM | POA: Diagnosis not present

## 2023-12-15 DIAGNOSIS — Z87440 Personal history of urinary (tract) infections: Secondary | ICD-10-CM | POA: Diagnosis not present

## 2023-12-15 DIAGNOSIS — Z79899 Other long term (current) drug therapy: Secondary | ICD-10-CM | POA: Diagnosis not present

## 2023-12-15 DIAGNOSIS — I5082 Biventricular heart failure: Secondary | ICD-10-CM | POA: Insufficient documentation

## 2023-12-15 DIAGNOSIS — Z7982 Long term (current) use of aspirin: Secondary | ICD-10-CM | POA: Diagnosis not present

## 2023-12-15 DIAGNOSIS — Z923 Personal history of irradiation: Secondary | ICD-10-CM | POA: Diagnosis not present

## 2023-12-15 DIAGNOSIS — E785 Hyperlipidemia, unspecified: Secondary | ICD-10-CM | POA: Insufficient documentation

## 2023-12-15 DIAGNOSIS — I110 Hypertensive heart disease with heart failure: Secondary | ICD-10-CM | POA: Insufficient documentation

## 2023-12-15 DIAGNOSIS — I252 Old myocardial infarction: Secondary | ICD-10-CM | POA: Diagnosis not present

## 2023-12-15 DIAGNOSIS — E119 Type 2 diabetes mellitus without complications: Secondary | ICD-10-CM | POA: Diagnosis not present

## 2023-12-15 DIAGNOSIS — Z794 Long term (current) use of insulin: Secondary | ICD-10-CM | POA: Diagnosis not present

## 2023-12-15 DIAGNOSIS — I5022 Chronic systolic (congestive) heart failure: Secondary | ICD-10-CM | POA: Diagnosis not present

## 2023-12-15 NOTE — Patient Instructions (Signed)
 Great to see you today  Your physician recommends that you schedule a follow-up appointment in: 1 year (March 2026), **PLEASE CALL OUR OFFICE IN [place] TO SCHEDULE THIS APPOINTMENT  If you have any questions or concerns before your next appointment please send [place] a message through [place] or call our office at [phone].    TO LEAVE A MESSAGE FOR THE NURSE SELECT OPTION 2, PLEASE LEAVE A MESSAGE INCLUDING: YOUR NAME DATE OF BIRTH CALL BACK NUMBER REASON FOR CALL**this is important as we prioritize the call backs  YOU WILL RECEIVE A CALL BACK THE SAME DAY AS LONG AS YOU CALL BEFORE 4:00 PM   At the [facility], you and your health needs are our priority. As part of our continuing mission to provide you with exceptional heart care, we have created designated Provider Care Teams. These Care Teams include your primary Cardiologist (physician) and Advanced Practice Providers (APPs- Physician Assistants and Nurse Practitioners) who all work together to provide you with the care you need, when you need it.   You may see any of the following providers on your designated Care Team at your next follow up: Dr Arvilla Meres Dr Marca Ancona Dr. Dorthula Nettles Dr. Clearnce Hasten Amy Filbert Schilder, NP Robbie Lis, [place] [facility] [place], [place] Brynda Peon, NP [place] Lee, NP Clarisa Kindred, NP Karle Plumber, PharmD Enos Fling, PharmD   Please be sure to bring in all your medications bottles to every appointment.    Thank you for choosing [facility]

## 2023-12-15 NOTE — Progress Notes (Signed)
 ADVANCED HF CLINIC NOTE  Primary Care: Glendale Chard, DO Primary Cardiologist: Dr. Sharyn Lull HF Cardiologist: Dr. Gala Romney  CC: Heart failure  HPI: Dana Coleman is a 83 y.o. female w/ h/o CAD, s/p remote inferior wall MI in 2000 treated w/ PCI to RCA, hypertension, type 2DM, HLD and h/o right sided breast cancer treated w/ radiation and mastectomy, no chemotherapy.    Followed by Dr. Sharyn Lull Echo 2019 EF 55-60%, G1D, RV normal  NST 2019 c/w prior inferior myocardial infarction. Small mild distal anterior/apical infarct with mild peri-infarct ischemia>> Low risk study    Admitted 04/2021 for nearly 6 wks. She had laparoscopic cholecystectomy on 05/02/21, discharged home, developed abdominal pain associated with sudden onset of altered mental status noted to be hypotensive, bradycardic and hypoxic by EMS requiring intubation and subsequently was noted to have a right empyema and perforation of the esophagus requiring emergent thoracotomy w/ repair of esophageal perforation with intercostal pedicled muscle flap, drainage of empyema and decortication of right lung.    Post-op she developed EKG changes w/ minimal inferior ST elevation and troponin leak. Echo showed moderately reduced LVEF, 40%, and severely reduced RV systolic function. However given recent surgery, she was not a candidate for intervention. She later developed increased O2 requirements, subsequently had CT angiogram which showed bilateral pulmonary embolism.  Repeat limited echo showed EF 25-30%, RV normal. No evidence of significant strain>>>IV heparin>>Eliquis. Hospitalization also notable for atrial flutter. Dr. Sharyn Lull followed during hospitalization.    Of note EKG 1/23 showed afib w/ CVR. F/u EKGs 3/23 showed NSR.   Admit 3/23 for a/c CHF. Remained in NSR during admission. Echo EF 25-30%, RV moderately reduced right ventricular systolic pressure 31.7 mmHg, mild MR. Diuresed 9 lb down to a dry wt of 119 lb.  Admitted 6/23  with CP, cardiology consulted. CHF compensated and underwent nuclear stress test which showed LVEF 25%, large fixed defect involving apical and inferior myocardium consistent with old infarction.   Echo 06/02/22 EF 30-35% previous inferior, inferolateral MI   ICD deferred given age and overall situation.  Echo 10/29/23 EF 40-45% Personally reviewed  Today she returns for HF follow up. Doing well. Gets tired at times but able to do all ADLs with CP or undue SOB. No edema,, orthopnea or PND. Compliant with meds.   Cardiac Testing  - Echo (8/23): EF 30-35%, previous inferior, inferolateral MI.  - Lexiscan myoview (6/23): LVEF 25%, large fixed defect apical and inferior myocardium, consistent with old infarction.   - Echo (3/23): EF 25-30%, RV moderately reduced right ventricular systolic pressure is 31.7 mmHg, mild MR   - Ltd Echo (8/22): EF 25-30%, RV normal   - Echo (7/22): EF 45%, RV severely reduced   - NST 12/2017  There was no ST segment deviation noted during stress. T wave inversion was noted during stress in the III leads, beginning at 1 minutes of stress. T wave inversion persisted. Findings consistent with prior inferior myocardial infarction. Small mild distal anterior/apical infarct with mild peri-infarct ischemia. This is a low risk study. Mild area of current ischemia. The left ventricular ejection fraction is normal (55-65%).     - Echo (3/19): EF 55-60%, G1D, RV normal   Past Medical History:  Diagnosis Date   Acute pulmonary edema (HCC)    Acute respiratory failure with hypoxemia (HCC)    Acute respiratory failure with hypoxia (HCC)    Allergy    occ uses OTC allergy meds    Breast cancer (HCC) 04/2008  Right breast   Cataracts, bilateral    removed bilat    Cervical cancer (HCC)    When the patient was in her 30s   Depression    Empyema (HCC)    Esophageal perforation 05/05/2021   Failure to thrive in adult 07/22/2021   Wt Readings from Last 3 Encounters:  06/26/21 65.8 kg 06/17/21 64.8 kg 04/26/21 58.1 kg     Gastrostomy tube in place [facility]) 08/21/2021   Heart attack (HCC) 2004   History of cervical cancer    S/p radiation and surgical removal in 30's.  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 Donnie Coffin   PEG (percutaneous endoscopic gastrostomy) status (HCC) 07/23/2021   Personal history of radiation therapy 2009   Pressure injury of skin 05/05/2021   Protein-calorie malnutrition, severe (HCC) 05/15/2021   Pyelonephritis 11/06/2021   S/P thoracotomy    Current Outpatient Medications  Medication Sig Dispense Refill   apixaban (ELIQUIS) 2.5 MG TABS tablet Take 1 tablet (2.5 mg total) by mouth 2 (two) times daily.     ascorbic acid (VITAMIN C) 500 MG tablet Take 500 mg by mouth daily.     bisacodyl (DULCOLAX) 5 MG EC tablet Take 5 mg by mouth every other day.     Blood Glucose Monitoring Suppl MISC daily.     cyanocobalamin (VITAMIN B12) 1000 MCG tablet Take 1 tablet (1,000 mcg total) by mouth daily. 30 tablet 0   Dulaglutide (TRULICITY) 1.5 MG/0.5ML SOAJ Inject 1.5 mg into the skin once a week. 2 mL 5   ENTRESTO 24-26 MG TAKE 1 TABLET BY MOUTH TWICE A DAY 60 tablet 2   ferrous sulfate (SLOW IRON) 160 (50 Fe) MG TBCR SR tablet Take 1 tablet (160 mg total) by mouth daily. 30 tablet 0   fluticasone (FLONASE) 50 MCG/ACT nasal spray Place 1 spray into both nostrils daily as needed for allergies or rhinitis. 1 spray in each nostril every day 16 g 12   furosemide (LASIX) 80 MG tablet Take 40 mg by mouth daily.     Insulin Glargine (LANTUS [place]) Inject 5 Units into the skin daily.     Insulin Pen Needle (B-D UF III MINI PEN NEEDLES) 31G X 5 MM MISC INJECT INSULIN VIA PEN 6 TIMES DAILY 200 each 3   Lancets  (ONETOUCH DELICA PLUS LANCET33G) MISC Use to check blood sugar twice daily. 100 each 3   Melatonin 3 MG TBDP Take 6 mg by mouth at bedtime as needed (sleep).     metoprolol succinate (TOPROL-XL) 25 MG 24 hr tablet TAKE 1/2 TABLET BY MOUTH DAILY 45 tablet 3   nitroGLYCERIN (NITROSTAT) 0.4 MG SL tablet Place 0.4 mg under the tongue every 5 (five) minutes as needed for chest pain.     omeprazole (PRILOSEC) 40 MG capsule TAKE 1 CAPSULE (40 MG TOTAL) BY MOUTH 2 (TWO) TIMES DAILY BEFORE A MEAL. 180 capsule 1   ONETOUCH ULTRA test strip CHECK BLOOD SUGAR TWICE DAILY 100 strip 1   rosuvastatin (CRESTOR) 20 MG tablet TAKE 1 TABLET BY MOUTH EVERYDAY AT BEDTIME 90 tablet 1   spironolactone (ALDACTONE) 25 MG tablet TAKE 1/2 TABLET BY MOUTH DAILY 45 tablet 3   traZODone (DESYREL) 50 MG  tablet TAKE 1 TABLET BY MOUTH EVERY DAY AT BEDTIME AS NEEDED FOR SLEEP 90 tablet 1   No current facility-administered medications for this encounter.   No Known Allergies  Social History   Socioeconomic History   Marital status: Divorced    Spouse name: Not on file   Number of children: 5   Years of education: 10   Highest education level: 10th grade  Occupational History   Occupation: Retired- Metallurgist  Tobacco Use   Smoking status: Never   Smokeless tobacco: Never   Tobacco comments:    No plans to start  Vaping Use   Vaping status: Never Used  Substance and Sexual Activity   Alcohol use: No   Drug use: No   Sexual activity: Not Currently    Birth control/protection: Condom  Other Topics Concern   Not on file  Social History Narrative   Patient lives alone in [place].   Patient is close with daughters Sunny Schlein and [place]. One Son in [place]. 2 children have passed.    Patient depends on her sister and brother for support and any assistance needed.    Talbert Forest (sister) [phone].   Religious / Personal Beliefs: Baptist   Patient does not drive.   Diet: Pt has a varied diet of  protein, starch, and vegetables and fruits.   Seatbelts: Pt reports wearing seatbelt when in vehicles.    Hobbies: shopping, visiting family, church   Education / Work:  [employer]                                                              Social Drivers of Corporate investment banker Strain: Low Risk  (04/17/2023)   Overall Financial Resource Strain (CARDIA)    Difficulty of Paying Living Expenses: Not hard at all  Food Insecurity: No Food Insecurity (04/17/2023)   Hunger Vital Sign    Worried About Running Out of Food in the Last Year: Never true    Ran Out of Food in the Last Year: Never true  Transportation Needs: No Transportation Needs (04/17/2023)   PRAPARE - Administrator, Civil Service (Medical): No    Lack of Transportation (Non-Medical): No  Physical Activity: Insufficiently Active (04/17/2023)   Exercise Vital Sign    Days of Exercise per Week: 3 days    Minutes of Exercise per Session: 30 min  Stress: No Stress Concern Present (04/17/2023)   [employer] of Occupational Health - Occupational Stress Questionnaire    Feeling of Stress : Only a little  Recent Concern: Stress - Stress Concern Present (01/20/2023)   [employer] of Occupational Health - Occupational Stress Questionnaire    Feeling of Stress : To some extent  Social Connections: Moderately Isolated (04/17/2023)   Social Connection and Isolation Panel [NHANES]    Frequency of Communication with Friends and Family: More than three times a week    Frequency of Social Gatherings with Friends and Family: Three times a week    Attends Religious Services: More than 4 times per year    Active Member of Clubs or Organizations: No    Attends Banker Meetings: Never    Marital Status: Divorced  Catering manager Violence: Not At Risk (04/17/2023)  Humiliation, Afraid, Rape, and Kick questionnaire    Fear of Current or Ex-Partner: No    Emotionally Abused: No    Physically  Abused: No    Sexually Abused: No   Family History  Problem Relation Age of Onset   Heart disease Mother        age 63   Heart disease Father    Cancer Father        unknown   Heart disease Brother    Hypertension Brother    Cirrhosis Brother    Diabetes Daughter    Stroke Daughter    Kidney disease Daughter    Hypertension Son    Colon cancer Neg Hx    Colon polyps Neg Hx    Esophageal cancer Neg Hx    Stomach cancer Neg Hx    Rectal cancer Neg Hx    Inflammatory bowel disease Neg Hx    Liver disease Neg Hx    Pancreatic cancer Neg Hx    BP 110/70   Pulse 78   Wt 54 kg (119 lb)   SpO2 95%   BMI 21.77 kg/m   Wt Readings from Last 3 Encounters:  12/15/23 54 kg (119 lb)  11/11/23 54 kg (119 lb)  10/29/23 54.4 kg (120 lb)   PHYSICAL EXAM: General:  Elderly No resp difficulty HEENT: normal Neck: supple. no JVD. Carotids 2+ bilat; no bruits. No lymphadenopathy or thryomegaly appreciated. Cor: PMI nondisplaced. Regular rate & rhythm. No rubs, gallops or murmurs. Lungs: clear Abdomen: soft, nontender, nondistended. No hepatosplenomegaly. No bruits or masses. Good bowel sounds. Extremities: no cyanosis, clubbing, rash, edema Neuro: alert & orientedx3, cranial nerves grossly intact. moves all 4 extremities w/o difficulty. Affect pleasant   ASSESSMENT & PLAN:  Chronic Biventricular Heart Failure - Etiology uncertain, possible ischemic CM given known h/o CAD, though no recent CP. Remote inferior MI in 2000 w/ RCA PCI. Cath report unavailable, uncertain if any additional disease  - Echo (2019): showed normal LVEF, 55-60%, and normal RV - drop in EF 7/22, down to 25-30% w/ mod-severely reduced RV, in the setting of severe illness from septic shock, hypoxic respiratory failure, in setting of rt lung empyema and esophageal perforation requiring emergency surgery, c/b perioperative PE and NSTEMI (medically managed in setting of severe illness) - Echo (3/23): EF 25-30%, RV  moderately reduced. - Echo 06/02/22 EF 30-35% previous inferior, inferolateral MI - Echo 10/29/23 EF 40-45% Personally reviewed - Stable NYHA II-III - Volume status ok - Continue Lasix 40 mg daily. - Continue spiro 12.5 mg daily. - Continue Entresto 24/26 mg bid. - Continue Toprol XL 12.5 mg daily.  - Continue Imdur 30 mg daily. - Intolerant of SGLT2i due to UTIs (Failed Jardiance in past. Refuses rechallenge) - have not titrated GDMT due to orthostatic hypotension and fraility    2. CAD  - h/o CAD, s/p remote inferior wall MI in 2000 treated w/ PCI to RCA - NST 2019 c/w prior inferior myocardial infarction. Small mild distal anterior/apical infarct with mild peri-infarct ischemia>> Low risk study.  - Admit 6/23 with CP. Lexiscan (6/23) showed EF 25%, large fixed defect involving apical and inferior myocardium consistent with old infarction.  - Patient no longer followed by Dr. Sharyn Lull, medical management for now. - No s/s angina - Continue statin/ASA - Lipids per PCP   3. PAF - Remains in NSR  - Continue Toprol XL.  - Continue Eliquis 2.5 mg bid  (appropriate dose for age and weight) - No  bleeding  4. H/o PE - On Eliquis as above - No bleeding   5. Hypertension  - Blood pressure well controlled. Continue current regimen.  6. Type 2DM  - A1c 6.4 912/24) - Managed by PCP - On insulin. - Failed SGLT2i due to frequent UTIs  - No change   Arvilla Meres, MD  2:38 PM

## 2023-12-17 ENCOUNTER — Other Ambulatory Visit: Payer: Self-pay | Admitting: Gastroenterology

## 2023-12-17 ENCOUNTER — Other Ambulatory Visit: Payer: Self-pay

## 2023-12-17 DIAGNOSIS — I70219 Atherosclerosis of native arteries of extremities with intermittent claudication, unspecified extremity: Secondary | ICD-10-CM

## 2023-12-23 ENCOUNTER — Ambulatory Visit (HOSPITAL_COMMUNITY)
Admission: RE | Admit: 2023-12-23 | Discharge: 2023-12-23 | Disposition: A | Discharge status: Home / Self Care | Payer: 59 | Source: Ambulatory Visit | Attending: Vascular Surgery | Admitting: Vascular Surgery

## 2023-12-23 ENCOUNTER — Encounter: Payer: Self-pay | Admitting: Vascular Surgery

## 2023-12-23 ENCOUNTER — Ambulatory Visit (INDEPENDENT_AMBULATORY_CARE_PROVIDER_SITE_OTHER): Payer: 59 | Admitting: Vascular Surgery

## 2023-12-23 VITALS — BP 133/73 | HR 79 | Temp 98.1°F | Ht 62.0 in | Wt 116.0 lb

## 2023-12-23 DIAGNOSIS — I739 Peripheral vascular disease, unspecified: Secondary | ICD-10-CM

## 2023-12-23 DIAGNOSIS — I70219 Atherosclerosis of native arteries of extremities with intermittent claudication, unspecified extremity: Secondary | ICD-10-CM

## 2023-12-23 LAB — VAS US ABI WITH/WO TBI
Left ABI: 0.69
Right ABI: 1.15

## 2023-12-23 NOTE — Progress Notes (Signed)
 Patient ID: Dana Coleman, female   DOB: [birth year], 83 y.o.   MRN: [id number]  Reason for Consult: New Patient (Initial Visit)   Referred by Glendale Chard, DO  Subjective:     HPI:  Dana Coleman is a 83 y.o. female history of coronary artery disease but with no history of vascular disease.  She is a lifelong non-smoker.  She does have diabetes and hyperlipidemia.  She is on Eliquis she is unsure why.  She does not take any antiplatelet medications.  She has been having pain in the left lower extremity for about the past 6 months difficulty with ambulation.  She states that she cannot walk 1 city block.  She also has some pain in the left thigh.  She denies any right lower extremity symptoms.  She denies tissue loss or ulceration\and has never had previous vascular intervention.  Past Medical History:  Diagnosis Date   Acute pulmonary edema (HCC)    Acute respiratory failure with hypoxemia (HCC)    Acute respiratory failure with hypoxia (HCC)    Allergy    occ uses OTC allergy meds    Breast cancer (HCC) 04/2008   Right breast   Cataracts, bilateral    removed bilat    Cervical cancer (HCC)    When the patient was in her 30s   Depression    Empyema (HCC)    Esophageal perforation 05/05/2021   Failure to thrive in adult 07/22/2021   Wt Readings from Last 3 Encounters: 06/26/21 65.8 kg 06/17/21 64.8 kg 04/26/21 58.1 kg     Gastrostomy tube in place [facility]) 08/21/2021   Heart attack (HCC) 2004   History of cervical cancer    S/p radiation and surgical removal in 30's.  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 Donnie Coffin   PEG (percutaneous endoscopic gastrostomy) status (HCC) 07/23/2021   Personal history of radiation therapy 2009    Pressure injury of skin 05/05/2021   Protein-calorie malnutrition, severe (HCC) 05/15/2021   Pyelonephritis 11/06/2021   S/P thoracotomy    Family History  Problem Relation Age of Onset   Heart disease Mother        age 84   Heart disease Father    Cancer Father        unknown   Heart disease Brother    Hypertension Brother    Cirrhosis Brother    Diabetes Daughter    Stroke Daughter    Kidney disease Daughter    Hypertension Son    Colon cancer Neg Hx    Colon polyps Neg Hx    Esophageal cancer Neg Hx    Stomach cancer Neg Hx    Rectal cancer Neg Hx    Inflammatory bowel disease Neg Hx    Liver disease Neg Hx    Pancreatic cancer Neg Hx    Past Surgical History:  Procedure Laterality Date   BIOPSY  09/21/2023   Procedure: BIOPSY;  Surgeon: Lemar Lofty., MD;  Location: [facility] ENDOSCOPY;  Service: Gastroenterology;;   BREAST LUMPECTOMY Right 2009   BREAST SURGERY  2009   right lumpectomy   CERVIX SURGERY  1980   CHOLECYSTECTOMY N/A 05/02/2021   Procedure: LAPAROSCOPIC CHOLECYSTECTOMY;  Surgeon:  Gaynelle Adu, MD;  Location: [facility] ORS;  Service: General;  Laterality: N/A;   COLONOSCOPY     ESOPHAGOGASTRODUODENOSCOPY (EGD) WITH PROPOFOL N/A 09/21/2023   Procedure: ESOPHAGOGASTRODUODENOSCOPY (EGD) WITH PROPOFOL;  Surgeon: Meridee Score Netty Starring., MD;  Location: [facility] ENDOSCOPY;  Service: Gastroenterology;  Laterality: N/A;   IR GASTROSTOMY TUBE MOD SED  06/03/2021   IR GASTROSTOMY TUBE REMOVAL  10/08/2021   LEFT HEART CATHETERIZATION WITH CORONARY ANGIOGRAM N/A 04/20/2014   Procedure: LEFT HEART CATHETERIZATION WITH CORONARY ANGIOGRAM;  Surgeon: Othella Boyer, MD;  Location: [facility] CATH LAB;  Service: Cardiovascular;  Laterality: N/A;   POLYPECTOMY     POLYPECTOMY  09/21/2023   Procedure: POLYPECTOMY;  Surgeon: Meridee Score Netty Starring., MD;  Location: [facility];  Service: Gastroenterology;;   Gaspar Bidding DILATION N/A 09/21/2023   Procedure: Jacklyn Shell;  Surgeon: Lemar Lofty., MD;  Location: [facility] ENDOSCOPY;  Service: Gastroenterology;  Laterality: N/A;   STENT PLACEMENT VASCULAR ([facility] HX)     THORACOTOMY Right 05/05/2021   Procedure: THORACOTOMY MAJOR REPAIR PERFORATED ESOPHAGUS;  Surgeon: Alleen Borne, MD;  Location: [facility] OR;  Service: Thoracic;  Laterality: Right;   thyroid     for nodule hemithyroidectomy, benign   THYROIDECTOMY Right 03/2003    Short Social History:  Social History   Tobacco Use   Smoking status: Never   Smokeless tobacco: Never   Tobacco comments:    No plans to start  Substance Use Topics   Alcohol use: No    No Known Allergies  Current Outpatient Medications  Medication Sig Dispense Refill   apixaban (ELIQUIS) 2.5 MG TABS tablet Take 1 tablet (2.5 mg total) by mouth 2 (two) times daily.     ascorbic acid (VITAMIN C) 500 MG tablet Take 500 mg by mouth daily.     bisacodyl (DULCOLAX) 5 MG EC tablet Take 5 mg by mouth every other day.     Blood Glucose Monitoring Suppl MISC daily.     cyanocobalamin (VITAMIN B12) 1000 MCG tablet Take 1 tablet (1,000 mcg total) by mouth daily. 30 tablet 0   Dulaglutide (TRULICITY) 1.5 MG/0.5ML SOAJ Inject 1.5 mg into the skin once a week. 2 mL 5   ENTRESTO 24-26 MG TAKE 1 TABLET BY MOUTH TWICE A DAY 60 tablet 2   ferrous sulfate (SLOW IRON) 160 (50 Fe) MG TBCR SR tablet Take 1 tablet (160 mg total) by mouth daily. 30 tablet 0   fluticasone (FLONASE) 50 MCG/ACT nasal spray Place 1 spray into both nostrils daily as needed for allergies or rhinitis. 1 spray in each nostril every day 16 g 12   furosemide (LASIX) 80 MG tablet Take 40 mg by mouth daily.     Insulin Glargine (LANTUS [place]) Inject 5 Units into the skin daily.     Insulin Pen Needle (B-D UF III MINI PEN NEEDLES) 31G X 5 MM MISC INJECT INSULIN VIA PEN 6 TIMES DAILY 200 each 3   Lancets (ONETOUCH DELICA PLUS LANCET33G) MISC Use to check blood sugar twice daily. 100 each 3   Melatonin 3 MG TBDP Take 6 mg by mouth at bedtime as needed  (sleep).     metoprolol succinate (TOPROL-XL) 25 MG 24 hr tablet TAKE 1/2 TABLET BY MOUTH DAILY 45 tablet 3   nitroGLYCERIN (NITROSTAT) 0.4 MG SL tablet Place 0.4 mg under the tongue every 5 (five) minutes as needed for chest pain.     omeprazole (PRILOSEC) 40 MG capsule TAKE 1 CAPSULE (40 MG TOTAL) BY MOUTH 2 (  TWO) TIMES DAILY BEFORE A MEAL. 180 capsule 1   ONETOUCH ULTRA test strip CHECK BLOOD SUGAR TWICE DAILY 100 strip 1   rosuvastatin (CRESTOR) 20 MG tablet TAKE 1 TABLET BY MOUTH EVERYDAY AT BEDTIME 90 tablet 1   spironolactone (ALDACTONE) 25 MG tablet TAKE 1/2 TABLET BY MOUTH DAILY 45 tablet 3   traZODone (DESYREL) 50 MG tablet TAKE 1 TABLET BY MOUTH EVERY DAY AT BEDTIME AS NEEDED FOR SLEEP 90 tablet 1   No current facility-administered medications for this visit.    Review of Systems  Constitutional:  Constitutional negative. HENT: HENT negative.  Eyes: Eyes negative.  Respiratory: Respiratory negative.  Cardiovascular: Positive for claudication.  GI: Gastrointestinal negative.  Musculoskeletal: Musculoskeletal negative.  Skin: Skin negative.  Neurological: Neurological negative. Hematologic: Hematologic/lymphatic negative.  Psychiatric: Psychiatric negative.        Objective:  Objective   Vitals:   12/23/23 1007  BP: 133/73  Pulse: 79  Temp: 98.1 F (36.7 C)  SpO2: 93%  Weight: 116 lb (52.6 kg)  Height: 5\' 2"  (1.575 m)   Body mass index is 21.22 kg/m.  Physical Exam HENT:     Head: Normocephalic.     Nose: Nose normal.  Eyes:     Pupils: Pupils are equal, round, and reactive to light.  Cardiovascular:     Rate and Rhythm: Normal rate.     Pulses:          Femoral pulses are 2+ on the right side and 0 on the left side.      Popliteal pulses are 2+ on the right side and 0 on the left side.       Dorsalis pedis pulses are 2+ on the right side and 0 on the left side.  Pulmonary:     Effort: Pulmonary effort is normal.  Abdominal:     General: Abdomen is  flat.  Musculoskeletal:        General: Normal range of motion.     Right lower leg: No edema.     Left lower leg: No edema.  Skin:    General: Skin is warm.     Capillary Refill: Capillary refill takes more than 3 seconds.  Neurological:     General: No focal deficit present.     Mental Status: She is alert.  Psychiatric:        Mood and Affect: Mood normal.        Behavior: Behavior normal.        Thought Content: Thought content normal.        Judgment: Judgment normal.     Data: ABI Findings:  +---------+------------------+-----+-----------+  Right   Rt Pressure (mmHg)IndexWaveform     +---------+------------------+-----+-----------+  Brachial 131                                 +---------+------------------+-----+-----------+  PTA     155               1.15 multiphasic  +---------+------------------+-----+-----------+  DP      147               1.09 multiphasic  +---------+------------------+-----+-----------+  Great Toe96                0.71 Normal       +---------+------------------+-----+-----------+   +---------+------------------+-----+----------+  Left    Lt Pressure (mmHg)IndexWaveform    +---------+------------------+-----+----------+  Brachial 135                                +---------+------------------+-----+----------+  PTA     93                0.69 monophasic  +---------+------------------+-----+----------+  DP      87                0.64 monophasic  +---------+------------------+-----+----------+  Great Toe33                0.24 Abnormal    +---------+------------------+-----+----------+   +-------+-----------+-----------+------------+------------+  ABI/TBIToday's ABIToday's TBIPrevious ABIPrevious TBI  +-------+-----------+-----------+------------+------------+  Right 1.15       0.71       1.07        0.84          +-------+-----------+-----------+------------+------------+  Left   0.69       0.24       0.71        0.40          +-------+-----------+-----------+------------+------------+     Summary:  Right: Resting right ankle-brachial index is within normal range. The  right toe-brachial index is normal.   Left: Resting left ankle-brachial index indicates moderate left lower  extremity arterial disease. The left toe-brachial index is abnormal.    Bilateral ABIs appear essentially unchanged compared to prior study on  04/22/21. Left TBIs appear decreased compared to prior study on 04/22/21.         Assessment/Plan:    83 year old female with short distance life limiting left lower extremity claudication with ABIs to support her symptoms.  She thankfully has no tissue loss or ulceration.  She does not have a left common femoral pulse but on the right she does have a left femoral pulse and even has dorsalis pedis pulses palpable on the right.  I discussed the need to continue walking I will follow her up in 6 months with repeat noninvasive studies.  If her symptoms worsen she can call to be seen sooner and we could consider angiography.  All questions answered in the presence of her granddaughter and they demonstrate good understanding.     Maeola Harman MD [facility]

## 2023-12-25 ENCOUNTER — Other Ambulatory Visit: Payer: Self-pay | Admitting: *Deleted

## 2023-12-25 DIAGNOSIS — I739 Peripheral vascular disease, unspecified: Secondary | ICD-10-CM

## 2023-12-25 DIAGNOSIS — I70219 Atherosclerosis of native arteries of extremities with intermittent claudication, unspecified extremity: Secondary | ICD-10-CM

## 2023-12-25 NOTE — Addendum Note (Signed)
 Encounter addended by: Howell Rucks, RDCS on: 12/25/2023 2:34 PM  Actions taken: Imaging Exam ended

## 2023-12-31 ENCOUNTER — Other Ambulatory Visit (HOSPITAL_COMMUNITY): Payer: Self-pay

## 2023-12-31 MED ORDER — NITROGLYCERIN 0.4 MG SL SUBL: 0.4000 mg | SUBLINGUAL_TABLET | SUBLINGUAL | 2 refills | Status: AC | PRN

## 2023-12-31 NOTE — Telephone Encounter (Signed)
 Meds ordered this encounter  Medications   nitroGLYCERIN (NITROSTAT) 0.4 MG SL tablet    Sig: Place 1 tablet (0.4 mg total) under the tongue every 5 (five) minutes as needed for chest pain.    Dispense:  30 tablet    Refill:  2

## 2024-01-01 DIAGNOSIS — I2699 Other pulmonary embolism without acute cor pulmonale: Secondary | ICD-10-CM | POA: Diagnosis not present

## 2024-01-01 DIAGNOSIS — I25118 Atherosclerotic heart disease of native coronary artery with other forms of angina pectoris: Secondary | ICD-10-CM | POA: Diagnosis not present

## 2024-01-01 DIAGNOSIS — I420 Dilated cardiomyopathy: Secondary | ICD-10-CM | POA: Diagnosis not present

## 2024-01-01 DIAGNOSIS — I5022 Chronic systolic (congestive) heart failure: Secondary | ICD-10-CM | POA: Diagnosis not present

## 2024-01-04 ENCOUNTER — Other Ambulatory Visit (HOSPITAL_COMMUNITY): Payer: Self-pay

## 2024-01-05 ENCOUNTER — Other Ambulatory Visit: Payer: Self-pay | Admitting: Gastroenterology

## 2024-01-08 ENCOUNTER — Other Ambulatory Visit (HOSPITAL_COMMUNITY): Payer: Self-pay | Admitting: Internal Medicine

## 2024-02-05 ENCOUNTER — Encounter: Payer: Self-pay | Admitting: Gastroenterology

## 2024-02-05 ENCOUNTER — Ambulatory Visit (INDEPENDENT_AMBULATORY_CARE_PROVIDER_SITE_OTHER): Payer: 59 | Admitting: Gastroenterology

## 2024-02-05 VITALS — BP 130/62 | HR 71 | Ht 62.0 in | Wt 117.0 lb

## 2024-02-05 DIAGNOSIS — K222 Esophageal obstruction: Secondary | ICD-10-CM

## 2024-02-05 DIAGNOSIS — R630 Anorexia: Secondary | ICD-10-CM

## 2024-02-05 DIAGNOSIS — K31A0 Gastric intestinal metaplasia, unspecified: Secondary | ICD-10-CM

## 2024-02-05 DIAGNOSIS — E538 Deficiency of other specified B group vitamins: Secondary | ICD-10-CM | POA: Diagnosis not present

## 2024-02-05 DIAGNOSIS — R634 Abnormal weight loss: Secondary | ICD-10-CM

## 2024-02-05 DIAGNOSIS — Z862 Personal history of diseases of the blood and blood-forming organs and certain disorders involving the immune mechanism: Secondary | ICD-10-CM

## 2024-02-05 DIAGNOSIS — R1310 Dysphagia, unspecified: Secondary | ICD-10-CM | POA: Diagnosis not present

## 2024-02-05 DIAGNOSIS — K5909 Other constipation: Secondary | ICD-10-CM

## 2024-02-05 NOTE — Patient Instructions (Signed)
 Please purchase the following medications over the counter and take as directed: Trial Miralax  1 capful dissolved in at least 8 ounces of water  1-2 times daily. If Miralax  does not help then you may resume Dulcolax.   Follow-up in 6 month- Office will contact you schedule. Call if you need sooner appointment.   _______________________________________________________  If your blood pressure at your visit was 140/90 or greater, please contact your primary care physician to follow up on this.  _______________________________________________________  If you are age 62 or older, your body mass index should be between 23-30. Your Body mass index is 21.4 kg/m. If this is out of the aforementioned range listed, please consider follow up with your Primary Care Provider.  If you are age 47 or younger, your body mass index should be between 19-25. Your Body mass index is 21.4 kg/m. If this is out of the aformentioned range listed, please consider follow up with your Primary Care Provider.   ________________________________________________________  The [facility] providers would like to encourage you to use MYCHART to communicate with providers for non-urgent requests or questions.  Due to long hold times on the telephone, sending your provider a message by [facility] may be a faster and more efficient way to get a response.  Please allow 48 business hours for a response.  Please remember that this is for non-urgent requests.  _______________________________________________________  Thank you for choosing me and [facility].  Dr. Brice Campi

## 2024-02-05 NOTE — Progress Notes (Signed)
 GASTROENTEROLOGY OUTPATIENT CLINIC VISIT   Primary Care Provider Clem Currier, DO [address] [place] [address] [phone]  Patient Profile: Dana Coleman  ELLIS WIEBKE is a 83 y.o. female with a pmh significant for CHF (EF 30-35%), atrial fibrillation, hypertension, hyperlipidemia, diabetes, prior breast cancer, prior cervical cancer, status postcholecystectomy, previous esophageal perforation status post surgical treatment, GERD, esophageal stricture post dilation), esophageal diverticulum, GIM, colon polyps (TA's).  The patient presents to the [facility] for an evaluation and management of problem(s) noted below:  Problem List 1. Dysphagia, unspecified type   2. Esophageal stricture   3. Gastric intestinal metaplasia   4. Unintentional weight loss   5. History of anemia   6. B12 deficiency   7. Other constipation    Discussed the use of AI scribe software for clinical note transcription with the patient, who gave verbal consent to proceed.  History of Present Illness Please see prior GI notes for full details of HPI.  Interval History The patient returns for follow-up and is accompanied by her granddaughter.  She has a history of dysphagia that is multifactorial (tortuosity/diverticula of the esophagus/stenosis post dilation) as well as gastric intestinal metaplasia.  I last saw her in December for her EGD.   We dilated her up to 14 mm and disrupted a Schatzki ring.  This has led to significant improvement in her symptoms of swallowing with easier passage of food and no sensation of food impaction.  She has not had progressive regurgitation or pyrosis symptoms on her medications at this time.  She has noted over the last few months some decreased appetite which she is not why it is occurring.  Over the last 6 months, she has had an unintentional weight loss of 8 pounds.  She denies any abdominal pain or dyspepsia symptoms.  She continues to experience  constipation, necessitating the use of laxatives.  She denies any blood in her stool.  She currently uses Dulcolax tablets as needed but experiences cramping with its use.  She has not been using MiraLAX , which was previously considered as an alternative.  She is hesitant for another procedure for GIM follow-up but will think further on this.  She is having fatigue and is not completely clear on her B12 levels (when last checked she was very low).  She is on Trulicity , which we discussed could be contributing to her decreased appetite and weight loss.   GI Review of Systems Positive as above Negative for odynophagia, melena, hematochezia  Review of Systems General: Denies fevers/chills/weight loss unintentionally Cardiovascular: Denies chest pain Pulmonary: Denies shortness of breath Gastroenterological: See HPI Genitourinary: Denies darkened urine Hematological: Denies easy bruising/bleeding Dermatological: Denies jaundice Psychological: Mood is stable   Medications Current Outpatient Medications  Medication Sig Dispense Refill   apixaban  (ELIQUIS ) 2.5 MG TABS tablet Take 1 tablet (2.5 mg total) by mouth 2 (two) times daily.     ascorbic acid (VITAMIN C) 500 MG tablet Take 500 mg by mouth daily.     bisacodyl (DULCOLAX) 5 MG EC tablet Take 5 mg by mouth every other day.     Blood Glucose Monitoring Suppl MISC daily.     cyanocobalamin  (VITAMIN B12) 1000 MCG tablet Take 1 tablet (1,000 mcg total) by mouth daily. 30 tablet 0   Dulaglutide  (TRULICITY ) 1.5 MG/0.5ML SOAJ Inject 1.5 mg into the skin once a week. 2 mL 5   ENTRESTO  24-26 MG TAKE 1 TABLET BY MOUTH TWICE A DAY 60 tablet 2   ferrous  sulfate (SLOW IRON ) 160 (50 Fe) MG TBCR SR tablet Take 1 tablet (160 mg total) by mouth daily. 30 tablet 0   fluticasone  (FLONASE ) 50 MCG/ACT nasal spray Place 1 spray into both nostrils daily as needed for allergies or rhinitis. 1 spray in each nostril every day 16 g 12   furosemide  (LASIX ) 80 MG  tablet Take 40 mg by mouth daily.     Insulin  Glargine (LANTUS  [place]) Inject 5 Units into the skin daily.     Insulin  Pen Needle (B-D UF III MINI PEN NEEDLES) 31G X 5 MM MISC INJECT INSULIN  VIA PEN 6 TIMES DAILY 200 each 3   Lancets (ONETOUCH DELICA PLUS LANCET33G) MISC Use to check blood sugar twice daily. 100 each 3   Melatonin 3 MG TBDP Take 6 mg by mouth at bedtime as needed (sleep).     metoprolol  succinate (TOPROL -XL) 25 MG 24 hr tablet TAKE 1/2 TABLET BY MOUTH DAILY 45 tablet 3   nitroGLYCERIN  (NITROSTAT ) 0.4 MG SL tablet Place 1 tablet (0.4 mg total) under the tongue every 5 (five) minutes as needed for chest pain. 30 tablet 2   omeprazole  (PRILOSEC) 40 MG capsule TAKE 1 CAPSULE (40 MG TOTAL) BY MOUTH 2 (TWO) TIMES DAILY BEFORE A MEAL. 180 capsule 1   ONETOUCH ULTRA test strip CHECK BLOOD SUGAR TWICE DAILY 100 strip 1   rosuvastatin  (CRESTOR ) 20 MG tablet TAKE 1 TABLET BY MOUTH EVERYDAY AT BEDTIME 90 tablet 1   spironolactone  (ALDACTONE ) 25 MG tablet TAKE 1/2 TABLET BY MOUTH DAILY 45 tablet 3   traZODone  (DESYREL ) 50 MG tablet TAKE 1 TABLET BY MOUTH EVERY DAY AT BEDTIME AS NEEDED FOR SLEEP 90 tablet 1   No current facility-administered medications for this visit.    Allergies No Known Allergies  Histories Past Medical History:  Diagnosis Date   Acute pulmonary edema (HCC)    Acute respiratory failure with hypoxemia (HCC)    Acute respiratory failure with hypoxia (HCC)    Allergy    occ uses OTC allergy meds    Breast cancer (HCC) 04/2008   Right breast   Cataracts, bilateral    removed bilat    Cervical cancer (HCC)    When the patient was in her 30s   Depression    Empyema (HCC)    Esophageal perforation 05/05/2021   Failure to thrive in adult 07/22/2021   Wt Readings from Last 3 Encounters: 06/26/21 65.8 kg 06/17/21 64.8 kg 04/26/21 58.1 kg     Gastrostomy tube in place [facility]) 08/21/2021   Heart attack (HCC) 2004   History of cervical cancer    S/p radiation and  surgical removal in 30's.  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 Kendra Pavy   PEG (percutaneous endoscopic gastrostomy) status (HCC) 07/23/2021   Personal history of radiation therapy 2009   Pressure injury of skin 05/05/2021   Protein-calorie malnutrition, severe (HCC) 05/15/2021   Pyelonephritis 11/06/2021   S/P thoracotomy    Past Surgical History:  Procedure Laterality Date   BIOPSY  09/21/2023   Procedure: BIOPSY;  Surgeon: Normie Becton., MD;  Location: [facility];  Service: Gastroenterology;;   BREAST LUMPECTOMY Right 2009   BREAST SURGERY  2009  right lumpectomy   CERVIX SURGERY  1980   CHOLECYSTECTOMY N/A 05/02/2021   Procedure: LAPAROSCOPIC CHOLECYSTECTOMY;  Surgeon: Aldean Hummingbird, MD;  Location: [facility] ORS;  Service: General;  Laterality: N/A;   COLONOSCOPY     ESOPHAGOGASTRODUODENOSCOPY (EGD) WITH PROPOFOL  N/A 09/21/2023   Procedure: ESOPHAGOGASTRODUODENOSCOPY (EGD) WITH PROPOFOL ;  Surgeon: Brice Campi Albino Alu., MD;  Location: [facility];  Service: Gastroenterology;  Laterality: N/A;   IR GASTROSTOMY TUBE MOD SED  06/03/2021   IR GASTROSTOMY TUBE REMOVAL  10/08/2021   LEFT HEART CATHETERIZATION WITH CORONARY ANGIOGRAM N/A 04/20/2014   Procedure: LEFT HEART CATHETERIZATION WITH CORONARY ANGIOGRAM;  Surgeon: Dorsey Gault, MD;  Location: [facility] CATH LAB;  Service: Cardiovascular;  Laterality: N/A;   POLYPECTOMY     POLYPECTOMY  09/21/2023   Procedure: POLYPECTOMY;  Surgeon: Brice Campi Albino Alu., MD;  Location: [facility];  Service: Gastroenterology;;   Dixie Frederickson DILATION N/A 09/21/2023   Procedure: Thurman Flores;  Surgeon: Normie Becton., MD;  Location: [facility];  Service: Gastroenterology;  Laterality: N/A;    STENT PLACEMENT VASCULAR ([facility] HX)     THORACOTOMY Right 05/05/2021   Procedure: THORACOTOMY MAJOR REPAIR PERFORATED ESOPHAGUS;  Surgeon: Bartley Lightning, MD;  Location: [facility] OR;  Service: Thoracic;  Laterality: Right;   thyroid      for nodule hemithyroidectomy, benign   THYROIDECTOMY Right 03/2003   Social History   Socioeconomic History   Marital status: Divorced    Spouse name: Not on file   Number of children: 5   Years of education: 10   Highest education level: 10th grade  Occupational History   Occupation: Retired- Metallurgist  Tobacco Use   Smoking status: Never   Smokeless tobacco: Never   Tobacco comments:    No plans to start  Vaping Use   Vaping status: Never Used  Substance and Sexual Activity   Alcohol use: No   Drug use: No   Sexual activity: Not Currently    Birth control/protection: Condom  Other Topics Concern   Not on file  Social History Narrative   Patient lives alone in [place].   Patient is close with daughters Marlis Simper and [place]. One Son in [place] . 2 children have passed.    Patient depends on her sister and brother for support and any assistance needed.    [place] (sister) [phone].   Religious / Personal Beliefs: Baptist   Patient does not drive.   Diet: Pt has a varied diet of protein, starch, and vegetables and fruits.   Seatbelts: Pt reports wearing seatbelt when in vehicles.    Hobbies: shopping, visiting family, church   Education / Work:  [employer]                                                              Social Drivers of Corporate investment banker Strain: Low Risk  (04/17/2023)   Overall Financial Resource Strain (CARDIA)    Difficulty of Paying Living Expenses: Not hard at all  Food Insecurity: No Food Insecurity (04/17/2023)   Hunger Vital Sign    Worried About Running Out of Food in the Last Year: Never true    Ran Out of Food in the Last Year: Never true  Transportation Needs: No  Transportation Needs (04/17/2023)  PRAPARE - Administrator, Civil Service (Medical): No    Lack of Transportation (Non-Medical): No  Physical Activity: Insufficiently Active (04/17/2023)   Exercise Vital Sign    Days of Exercise per Week: 3 days    Minutes of Exercise per Session: 30 min  Stress: No Stress Concern Present (04/17/2023)   [employer] of Occupational Health - Occupational Stress Questionnaire    Feeling of Stress : Only a little  Recent Concern: Stress - Stress Concern Present (01/20/2023)   [employer] of Occupational Health - Occupational Stress Questionnaire    Feeling of Stress : To some extent  Social Connections: Moderately Isolated (04/17/2023)   Social Connection and Isolation Panel [NHANES]    Frequency of Communication with Friends and Family: More than three times a week    Frequency of Social Gatherings with Friends and Family: Three times a week    Attends Religious Services: More than 4 times per year    Active Member of Clubs or Organizations: No    Attends Banker Meetings: Never    Marital Status: Divorced  Catering manager Violence: Not At Risk (04/17/2023)   Humiliation, Afraid, Rape, and Kick questionnaire    Fear of Current or Ex-Partner: No    Emotionally Abused: No    Physically Abused: No    Sexually Abused: No   Family History  Problem Relation Age of Onset   Heart disease Mother        age 70   Heart disease Father    Cancer Father        unknown   Heart disease Brother    Hypertension Brother    Cirrhosis Brother    Diabetes Daughter    Stroke Daughter    Kidney disease Daughter    Hypertension Son    Colon cancer Neg Hx    Colon polyps Neg Hx    Esophageal cancer Neg Hx    Stomach cancer Neg Hx    Rectal cancer Neg Hx    Inflammatory bowel disease Neg Hx    Liver disease Neg Hx    Pancreatic cancer Neg Hx    I have reviewed her medical, social, and family history in detail and updated the  electronic medical record as necessary.    PHYSICAL EXAMINATION  BP 130/62   Pulse 71   Ht 5\' 2"  (1.575 m)   Wt 117 lb (53.1 kg)   BMI 21.40 kg/m  Wt Readings from Last 3 Encounters:  02/05/24 117 lb (53.1 kg)  12/23/23 116 lb (52.6 kg)  12/15/23 119 lb (54 kg)  GEN: NAD, appears stated age, doesn't appear chronically ill, granddaughter with her PSYCH: Cooperative, without pressured speech EYE: Conjunctivae pink, sclerae anicteric ENT: MMM CV: Nontachycardic RESP: No audible wheezing GI: NABS, soft, NT/ND, without rebound MSK/EXT: No significant lower extremity edema SKIN: No jaundice NEURO:  Alert & Oriented x 3, no focal deficits   REVIEW OF DATA  I reviewed the following data at the time of this encounter:  GI Procedures and Studies  12/24 EGD - Moderately tortuous esophagus. - Multiple diverticuli were found in the mid esophagus and in the distal esophagus. - Low-grade of narrowing Schatzki ring. Disrupted. - No other gross mucosal lesions in the entire esophagus. Biopsied. - Z-line irregular, 35 cm from the incisors. - Dilation performed in the esophagus up to 14 mm savory with mucosal wrent noted just below the UES. - 3 cm hiatal hernia. - Gastric diverticulum noted  in anterior stomach, most likely previous PEG tube sites. - Two gastric polyps. Resected and retrieved. - Erythematous mucosa in the stomach. Biopsied. - No gross lesions in the duodenal bulb, in the first portion of the duodenum and in the second portion of the duodenum.  Laboratory Studies  Reviewed those in epic  Imaging Studies  No new imaging studies to review   ASSESSMENT  Dana Coleman is a 82 y.o. female with a pmh significant for CHF (EF 30-35%), atrial fibrillation, hypertension, hyperlipidemia, diabetes, prior breast cancer, prior cervical cancer, status postcholecystectomy, previous esophageal perforation status post surgical treatment, GERD, esophageal stricture post dilation), esophageal  diverticulum, GIM, colon polyps (TA's).  The patient is seen today for evaluation and management of:  1. Dysphagia, unspecified type   2. Esophageal stricture   3. Gastric intestinal metaplasia   4. Unintentional weight loss   5. History of anemia   6. B12 deficiency   7. Other constipation    The patient is clinically and hemodynamically stable.  She is much improved in regards to her upper GI dysphagia symptoms post dilation.  She is not having any symptomatology currently.  Recommended we consider further dilation but she is a little hesitant since she is doing so well.  I think her esophagus dysphagia is multifactorial however in the setting of dysmotility and as well as the esophageal diverticulum.  I also recommend upper endoscopy due to her history of finding of focal intestinal metaplasia of the stomach entheses GIM).  At this time she is not interested surveillance, even hearing the concern for which and why we performed gastric mapping biopsies for intestinal metaplasia.  She will consider this further with her family and update us  if something else changes with regards to plan for repeat endoscopy.  I recommended that she initiate MiraLAX  on a daily basis for her constipation but she can use Dulcolax as needed.  I suspect some of her unintentional weight loss and decreased appetite is a result of her GLP treatment.  If she continues to have further weight loss and decreased appetite at her follow-up in 6 months, we may need to consider stopping that medication.  We can recheck her anemia labs, and she has an upcoming appointment with her PCP next week so we will put these in his future labs.  She is hesitant about another colonoscopy just before.  All patient questions were answered to the best of my ability, and the patient agrees to the aforementioned plan of action with follow-up as indicated.   PLAN  Continue PPI 40 mg twice daily - Could consider decreasing to once daily if she is up  for Recommended repeat EGD but deferred by patient for now - This was for further dilation consideration - This was for GIM gastric mapping Continue fiber supplementation once to twice daily Recommend MiraLAX  daily May use Dulcolax as needed  Can consider Linzess prescription in future (was helpful in the past but she did not want to continue this previously) Laboratories as outlined below to be drawn at PCP office - Follow-up B12 labs - If still low, consider antiparietal cell and intrinsic factor antibodies   Orders Placed This Encounter  Procedures   CBC   Comp Met (CMET)   TSH   Cortisol   B12   Folate    New Prescriptions   No medications on file   Modified Medications   No medications on file    Planned Follow Up Return in about 6 months (  around 08/06/2024).   Total Time in Face-to-Face and in Coordination of Care for patient including independent/personal interpretation/review of prior testing, medical history, examination, medication adjustment, communicating results with the patient directly, and documentation within the EHR is 25 minutes.   Yong Henle, MD [facility] Advanced [facility]oscopy Office # [phone]

## 2024-02-06 ENCOUNTER — Encounter: Payer: Self-pay | Admitting: Gastroenterology

## 2024-02-06 DIAGNOSIS — R630 Anorexia: Secondary | ICD-10-CM | POA: Insufficient documentation

## 2024-02-06 DIAGNOSIS — K31A0 Gastric intestinal metaplasia, unspecified: Secondary | ICD-10-CM | POA: Insufficient documentation

## 2024-02-06 DIAGNOSIS — R634 Abnormal weight loss: Secondary | ICD-10-CM | POA: Insufficient documentation

## 2024-02-06 DIAGNOSIS — Z862 Personal history of diseases of the blood and blood-forming organs and certain disorders involving the immune mechanism: Secondary | ICD-10-CM | POA: Insufficient documentation

## 2024-02-06 DIAGNOSIS — E538 Deficiency of other specified B group vitamins: Secondary | ICD-10-CM | POA: Insufficient documentation

## 2024-02-06 DIAGNOSIS — K222 Esophageal obstruction: Secondary | ICD-10-CM | POA: Insufficient documentation

## 2024-02-09 ENCOUNTER — Ambulatory Visit: Admitting: Student

## 2024-02-09 ENCOUNTER — Encounter: Payer: Self-pay | Admitting: Student

## 2024-02-09 ENCOUNTER — Other Ambulatory Visit: Payer: Self-pay | Admitting: Student

## 2024-02-09 VITALS — BP 117/70 | HR 84 | Ht 62.0 in | Wt 117.6 lb

## 2024-02-09 DIAGNOSIS — Z7985 Long-term (current) use of injectable non-insulin antidiabetic drugs: Secondary | ICD-10-CM | POA: Diagnosis not present

## 2024-02-09 DIAGNOSIS — E1169 Type 2 diabetes mellitus with other specified complication: Secondary | ICD-10-CM | POA: Diagnosis not present

## 2024-02-09 LAB — POCT GLYCOSYLATED HEMOGLOBIN (HGB A1C): HbA1c, POC (controlled diabetic range): 6.9 % (ref 0.0–7.0)

## 2024-02-09 NOTE — Patient Instructions (Signed)
 It was great to see you today!   Today we addressed: Keep taking all of your medications. I have made no changes today.  Follow up in 3 months   Future Appointments  Date Time Provider Department Center  04/07/2024  1:30 PM [facility] ANNUAL WELLNESS VISIT [facility]  06/22/2024 11:00 AM [facility]  06/22/2024 11:30 AM [facility]  06/22/2024 12:20 PM Adine Hoof, MD [facility]    Please arrive 15 minutes before your appointment to ensure smooth check in process.    Please call the clinic at [phone] if your symptoms worsen or you have any concerns.  Thank you for allowing me to participate in your care, Dr. Clem Currier [facility]

## 2024-02-09 NOTE — Assessment & Plan Note (Signed)
 A1c today 6.9.  Congratulated patient on continuing in her great diabetic control.  Continue Trulicity  2.5 mg weekly and glargine 5 units daily.  Patient to follow-up in 3 months.  If A1c is still well-controlled can space visits out to 6 months.

## 2024-02-09 NOTE — Progress Notes (Signed)
    SUBJECTIVE:   CHIEF COMPLAINT / HPI:   Dana Coleman is a 83 y.o. female presenting for follow up.   Type 2 Diabetes: Home medications include: 5 units glargine, Trulicity  1.5 mg weekly. Does endorse compliance. Home glucose monitoring is performed regularly.   Most recent A1Cs:  Lab Results  Component Value Date   HGBA1C 6.9 02/09/2024   HGBA1C 6.4 08/26/2023   Last Microalbumin, LDL, Creatinine: Lab Results  Component Value Date   MICROALBUR 10 01/20/2012   LDLCALC 58 11/06/2021   CREATININE 1.01 (H) 10/21/2023    Patient is up to date on diabetic eye. Patient is up to date on diabetic foot exam.  PERTINENT  PMH / PSH: reviewed and updated.  OBJECTIVE:   BP 117/70   Pulse 84   Ht 5\' 2"  (1.575 m)   Wt 117 lb 9.6 oz (53.3 kg)   SpO2 100%   BMI 21.51 kg/m   Well-appearing, no acute distress Cardio: Regular rate, regular rhythm, no murmurs on exam. Pulm: Clear, no wheezing, no crackles. No increased work of breathing Abdominal: bowel sounds present, soft, non-tender, non-distended Extremities: no peripheral edema  Neuro: alert and oriented x3, speech normal in content, no facial asymmetry, strength intact and equal bilaterally in UE and LE, pupils equal and reactive to light.  Psych:  Cognition and judgment appear intact. Alert, communicative  and cooperative with normal attention span and concentration. No apparent delusions, illusions, hallucinations    ASSESSMENT/PLAN:   Assessment & Plan Type 2 diabetes mellitus with other specified complication, unspecified whether long term insulin  use (HCC) A1c today 6.9.  Congratulated patient on continuing in her great diabetic control.  Continue Trulicity  2.5 mg weekly and glargine 5 units daily.  Patient to follow-up in 3 months.  If A1c is still well-controlled can space visits out to 6 months.   Next visit: Recheck BMP and obtain urine microalbumin  Clem Currier, DO [facility]

## 2024-03-01 ENCOUNTER — Other Ambulatory Visit (HOSPITAL_COMMUNITY): Payer: Self-pay

## 2024-03-09 ENCOUNTER — Encounter: Payer: Self-pay | Admitting: Student

## 2024-03-09 ENCOUNTER — Ambulatory Visit (INDEPENDENT_AMBULATORY_CARE_PROVIDER_SITE_OTHER): Admitting: Student

## 2024-03-09 ENCOUNTER — Other Ambulatory Visit (HOSPITAL_COMMUNITY): Payer: Self-pay

## 2024-03-09 VITALS — BP 167/85 | HR 70 | Temp 98.8°F | Ht 62.0 in | Wt 117.4 lb

## 2024-03-09 DIAGNOSIS — R051 Acute cough: Secondary | ICD-10-CM | POA: Diagnosis not present

## 2024-03-09 MED ORDER — AZELASTINE HCL 0.1 % NA SOLN: 2.0000 | Freq: Two times a day (BID) | NASAL | 12 refills | Status: AC

## 2024-03-09 MED ORDER — PSEUDOEPHEDRINE HCL 30 MG PO TABS: 30.0000 mg | ORAL_TABLET | Freq: Four times a day (QID) | ORAL | 0 refills | Status: DC | PRN

## 2024-03-09 MED ORDER — FLUTICASONE PROPIONATE 50 MCG/ACT NA SUSP: 1.0000 | Freq: Every day | NASAL | 12 refills | Status: AC | PRN

## 2024-03-09 NOTE — Patient Instructions (Addendum)
 Ms. Meuth,  It sounds like this is a viral illness based on the symptoms you are having. I'm sending in two nasal sprays (Azelastine  and Flonase ) and some sudafed for you. Do not use the sudafed for more than three days.   I've made you an appointment to come back and see us  on Monday afternoon to check back in.   If you start having chest pain, worsening shortness of breath, please go to the ER. If you develop a new fever, please call us  right away.   Please go to [facility] at [employer] to get your X-Ray done. They are open 7:30a-5p Monday-Friday. You do not need an appointment to get this done. Alexa Andrews, MD

## 2024-03-09 NOTE — Progress Notes (Unsigned)
    SUBJECTIVE:   CHIEF COMPLAINT / HPI:   Dana  W Coleman is an 83 year old female with heart disease who presents with congestion, cough, and headaches for two weeks. She is accompanied by her granddaughter.  She has been experiencing congestion, cough, and headaches for the past two weeks. The cough is productive with yellow sputum. She has been using over-the-counter medications, including Theraflu and Tylenol , with minimal relief. No fever is reported, and her temperature has been around 97-98 degrees. She also notes redness in her eyes, which is concerning to her.  She denies any history of lung disease such as COPD and has never been a smoker. She lives alone and has not been around anyone who is sick recently. She reports chronic shortness of breath, not any worse with present illness.   She denies any recent swelling in her legs or weight changes. She is currently taking Entresto  and Eliquis . In terms of her social history, she lives alone and her granddaughter is involved in her care.  PERTINENT  PMH / PSH: Hx PE on Eliquis , Paroxysmal A fib, CAD, HFrEF, prior NSTEMI, orthostatic hypotension, PAD, Hx esophageal perforation, T2DM  OBJECTIVE:   BP (!) 167/85   Pulse 70   Temp 98.8 F (37.1 C) (Oral)   Ht 5\' 2"  (1.575 m)   Wt 117 lb 6.4 oz (53.3 kg)   SpO2 99%   BMI 21.47 kg/m   Gen: Age appropriate, elderly but NAD  HENT: MMM, nasal congestion is present, clear rhinorrhea, oropharynx is clear  Neck: Without lymphadenopathy, no JVD Pulm: Normal WOB on RA, lungs are clear to auscultation throughout  Ext: Without peripheral edema   ASSESSMENT/PLAN:   Assessment & Plan Acute cough Symptoms suggest viral etiology with URI symptoms. Nothing on history or exam to suggest pneumonia. - Prescribed azelastine and Flonase  nasal sprays - Recommended over-the-counter Sudafed for 2-3 days for decongestion. - Advised against ibuprofen due to Eliquis  use; recommended Tylenol  for  headache. - Advised against Theraflu with current medications. - Ordered chest x-ray per patient request given complex history and duration of symptoms      J Lark Plum, MD [facility]

## 2024-03-10 ENCOUNTER — Other Ambulatory Visit (HOSPITAL_COMMUNITY): Payer: Self-pay | Admitting: Internal Medicine

## 2024-03-14 ENCOUNTER — Other Ambulatory Visit (HOSPITAL_COMMUNITY): Payer: Self-pay

## 2024-03-14 ENCOUNTER — Ambulatory Visit: Payer: Self-pay | Admitting: Family Medicine

## 2024-04-01 DIAGNOSIS — I2699 Other pulmonary embolism without acute cor pulmonale: Secondary | ICD-10-CM | POA: Diagnosis not present

## 2024-04-01 DIAGNOSIS — I420 Dilated cardiomyopathy: Secondary | ICD-10-CM | POA: Diagnosis not present

## 2024-04-01 DIAGNOSIS — I5022 Chronic systolic (congestive) heart failure: Secondary | ICD-10-CM | POA: Diagnosis not present

## 2024-04-01 DIAGNOSIS — I25118 Atherosclerotic heart disease of native coronary artery with other forms of angina pectoris: Secondary | ICD-10-CM | POA: Diagnosis not present

## 2024-04-02 ENCOUNTER — Other Ambulatory Visit: Payer: Self-pay | Admitting: Gastroenterology

## 2024-04-07 ENCOUNTER — Ambulatory Visit: Payer: 59

## 2024-04-07 VITALS — Ht 62.0 in | Wt 117.0 lb

## 2024-04-07 DIAGNOSIS — Z0000 Encounter for general adult medical examination without abnormal findings: Secondary | ICD-10-CM

## 2024-04-07 NOTE — Progress Notes (Signed)
 Because this visit was a virtual/telehealth visit,  certain criteria was not obtained, such a blood pressure, CBG if applicable, and timed get up and go. Any medications not marked as taking were not mentioned during the medication reconciliation part of the visit. Any vitals not documented were not able to be obtained due to this being a telehealth visit or patient was unable to self-report a recent blood pressure reading due to a lack of equipment at home via telehealth. Vitals that have been documented are verbally provided by the patient.   Subjective:   Dana  LELON Coleman is a 83 y.o. who presents for a Medicare Wellness preventive visit.  As a reminder, Annual Wellness Visits don't include a physical exam, and some assessments may be limited, especially if this visit is performed virtually. We may recommend an in-person follow-up visit with your provider if needed.  Visit Complete: Virtual I connected with  Dana  W Coleman on 04/07/24 by a audio enabled telemedicine application and verified that I am speaking with the correct person using two identifiers.  Patient Location: Home  Provider Location: Office/Clinic  I discussed the limitations of evaluation and management by telemedicine. The patient expressed understanding and agreed to proceed.  Vital Signs: Because this visit was a virtual/telehealth visit, some criteria may be missing or patient reported. Any vitals not documented were not able to be obtained and vitals that have been documented are patient reported.  VideoDeclined- This patient declined Librarian, academic. Therefore the visit was completed with audio only.  Persons Participating in Visit: Patient.  AWV Questionnaire: No: Patient Medicare AWV questionnaire was not completed prior to this visit.  Cardiac Risk Factors include: advanced age (>39men, >26 women);diabetes mellitus;dyslipidemia;family history of premature cardiovascular  disease;hypertension;sedentary lifestyle     Objective:    Today's Vitals   04/07/24 1349  Weight: 117 lb (53.1 kg)  Height: 5' 2 (1.575 m)  PainSc: 0-No pain   Body mass index is 21.4 kg/m.     04/07/2024    1:52 PM 11/11/2023    9:16 AM 09/21/2023   10:14 AM 08/26/2023    9:54 AM 04/17/2023   12:07 PM 03/23/2023    1:48 PM 02/12/2023   10:39 AM  Advanced Directives  Does Patient Have a Medical Advance Directive? No No No No No No No  Would patient like information on creating a medical advance directive? No - Patient declined No - Patient declined No - Patient declined No - Patient declined Yes (MAU/Ambulatory/Procedural Areas - Information given) No - Patient declined No - Patient declined    Current Medications (verified) Outpatient Encounter Medications as of 04/07/2024  Medication Sig   apixaban  (ELIQUIS ) 2.5 MG TABS tablet Take 1 tablet (2.5 mg total) by mouth 2 (two) times daily.   ascorbic acid (VITAMIN C) 500 MG tablet Take 500 mg by mouth daily.   azelastine  (ASTELIN ) 0.1 % nasal spray Place 2 sprays into both nostrils 2 (two) times daily. Use in each nostril as directed   bisacodyl (DULCOLAX) 5 MG EC tablet Take 5 mg by mouth every other day.   Blood Glucose Monitoring Suppl MISC daily.   cyanocobalamin  (VITAMIN B12) 1000 MCG tablet Take 1 tablet (1,000 mcg total) by mouth daily.   Dulaglutide  (TRULICITY ) 1.5 MG/0.5ML SOAJ Inject 1.5 mg into the skin once a week.   ENTRESTO  24-26 MG TAKE 1 TABLET BY MOUTH TWICE A DAY   ferrous sulfate (SLOW IRON ) 160 (50 Fe) MG TBCR SR tablet  Take 1 tablet (160 mg total) by mouth daily.   fluticasone  (FLONASE ) 50 MCG/ACT nasal spray Place 1 spray into both nostrils daily as needed for allergies or rhinitis. 1 spray in each nostril every day   furosemide  (LASIX ) 80 MG tablet Take 40 mg by mouth daily.   Insulin  Glargine (LANTUS  [place]) Inject 5 Units into the skin daily.   Insulin  Pen Needle (B-D UF III MINI PEN NEEDLES) 31G X 5 MM MISC  INJECT INSULIN  VIA PEN 6 TIMES DAILY   Lancets (ONETOUCH DELICA PLUS LANCET33G) MISC Use to check blood sugar twice daily.   Melatonin 3 MG TBDP Take 6 mg by mouth at bedtime as needed (sleep).   metoprolol  succinate (TOPROL -XL) 25 MG 24 hr tablet TAKE 1/2 TABLET BY MOUTH DAILY   nitroGLYCERIN  (NITROSTAT ) 0.4 MG SL tablet Place 1 tablet (0.4 mg total) under the tongue every 5 (five) minutes as needed for chest pain.   omeprazole  (PRILOSEC) 40 MG capsule TAKE 1 CAPSULE BY MOUTH 2 TIMES DAILY BEFORE A MEAL.   ONETOUCH ULTRA test strip CHECK BLOOD SUGAR TWICE DAILY   pseudoephedrine  (SUDAFED) 30 MG tablet Take 1 tablet (30 mg total) by mouth every 6 (six) hours as needed for congestion. Do not use for more than 3 days   rosuvastatin  (CRESTOR ) 20 MG tablet TAKE 1 TABLET BY MOUTH EVERYDAY AT BEDTIME   spironolactone  (ALDACTONE ) 25 MG tablet TAKE 1/2 TABLET BY MOUTH DAILY   traZODone  (DESYREL ) 50 MG tablet TAKE 1 TABLET BY MOUTH EVERY DAY AT BEDTIME AS NEEDED FOR SLEEP   No facility-administered encounter medications on file as of 04/07/2024.    Allergies (verified) Patient has no known allergies.   History: Past Medical History:  Diagnosis Date   Acute pulmonary edema (HCC)    Acute respiratory failure with hypoxemia (HCC)    Acute respiratory failure with hypoxia (HCC)    Allergy    occ uses OTC allergy meds    Breast cancer (HCC) 04/2008   Right breast   Cataracts, bilateral    removed bilat    Cervical cancer (HCC)    When the patient was in her 30s   Depression    Empyema (HCC)    Esophageal perforation 05/05/2021   Failure to thrive in adult 07/22/2021   Wt Readings from Last 3 Encounters: 06/26/21 65.8 kg 06/17/21 64.8 kg 04/26/21 58.1 kg     Gastrostomy tube in place [facility]) 08/21/2021   Heart attack (HCC) 2004   History of cervical cancer    S/p radiation and surgical removal in 30's.  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 Melodye   PEG (percutaneous endoscopic gastrostomy) status (HCC) 07/23/2021   Personal history of radiation therapy 2009   Pressure injury of skin 05/05/2021   Protein-calorie malnutrition, severe (HCC) 05/15/2021   Pyelonephritis 11/06/2021   S/P thoracotomy    Past Surgical History:  Procedure Laterality Date   BIOPSY  09/21/2023   Procedure: BIOPSY;  Surgeon: Wilhelmenia Aloha Raddle., MD;  Location: [facility];  Service: Gastroenterology;;   BREAST LUMPECTOMY Right 2009   BREAST SURGERY  2009   right lumpectomy   CERVIX SURGERY  1980   CHOLECYSTECTOMY N/A 05/02/2021   Procedure:  LAPAROSCOPIC CHOLECYSTECTOMY;  Surgeon: Tanda Locus, MD;  Location: [facility];  Service: General;  Laterality: N/A;   COLONOSCOPY     ESOPHAGOGASTRODUODENOSCOPY (EGD) WITH PROPOFOL  N/A 09/21/2023   Procedure: ESOPHAGOGASTRODUODENOSCOPY (EGD) WITH PROPOFOL ;  Surgeon: Wilhelmenia Aloha Raddle., MD;  Location: [facility];  Service: Gastroenterology;  Laterality: N/A;   IR GASTROSTOMY TUBE MOD SED  06/03/2021   IR GASTROSTOMY TUBE REMOVAL  10/08/2021   LEFT HEART CATHETERIZATION WITH CORONARY ANGIOGRAM N/A 04/20/2014   Procedure: LEFT HEART CATHETERIZATION WITH CORONARY ANGIOGRAM;  Surgeon: Elsie GORMAN Somerset, MD;  Location: [facility] LAB;  Service: Cardiovascular;  Laterality: N/A;   POLYPECTOMY     POLYPECTOMY  09/21/2023   Procedure: POLYPECTOMY;  Surgeon: Wilhelmenia Aloha Raddle., MD;  Location: [facility];  Service: Gastroenterology;;   HARLEY DILATION N/A 09/21/2023   Procedure: HARLEY HODGKIN;  Surgeon: Wilhelmenia Aloha Raddle., MD;  Location: [facility];  Service: Gastroenterology;  Laterality: N/A;   STENT PLACEMENT VASCULAR ([facility] HX)     THORACOTOMY Right 05/05/2021   Procedure: THORACOTOMY MAJOR  REPAIR PERFORATED ESOPHAGUS;  Surgeon: Lucas Dorise POUR, MD;  Location: [facility];  Service: Thoracic;  Laterality: Right;   thyroid      for nodule hemithyroidectomy, benign   THYROIDECTOMY Right 03/2003   Family History  Problem Relation Age of Onset   Heart disease Mother        age 67   Heart disease Father    Cancer Father        unknown   Heart disease Brother    Hypertension Brother    Cirrhosis Brother    Diabetes Daughter    Stroke Daughter    Kidney disease Daughter    Hypertension Son    Colon cancer Neg Hx    Colon polyps Neg Hx    Esophageal cancer Neg Hx    Stomach cancer Neg Hx    Rectal cancer Neg Hx    Inflammatory bowel disease Neg Hx    Liver disease Neg Hx    Pancreatic cancer Neg Hx    Social History   Socioeconomic History   Marital status: Divorced    Spouse name: Not on file   Number of children: 5   Years of education: 10   Highest education level: 10th grade  Occupational History   Occupation: Retired- Metallurgist  Tobacco Use   Smoking status: Never   Smokeless tobacco: Never   Tobacco comments:    No plans to start  Vaping Use   Vaping status: Never Used  Substance and Sexual Activity   Alcohol use: No   Drug use: No   Sexual activity: Not Currently    Birth control/protection: Condom  Other Topics Concern   Not on file  Social History Narrative   Patient lives alone in [place].   Patient is close with daughters [place] and [place]. One Son in [place] . 2 children have passed.    Patient depends on her sister and brother for support and any assistance needed.    Orlean (sister) [phone].   Religious / Personal Beliefs: Baptist   Patient does not drive.   Diet: Pt has a varied diet of protein, starch, and vegetables and fruits.   Seatbelts: Pt reports wearing seatbelt when in vehicles.    Hobbies: shopping, visiting family, church   Education / Work:  [employer]  Social Drivers of Corporate investment banker Strain: Low Risk  (04/07/2024)   Overall Financial Resource Strain (CARDIA)    Difficulty of Paying Living Expenses: Not hard at all  Food Insecurity: No Food Insecurity (04/07/2024)   Hunger Vital Sign    Worried About Running Out of Food in the Last Year: Never true    Ran Out of Food in the Last Year: Never true  Transportation Needs: No Transportation Needs (04/07/2024)   PRAPARE - Administrator, Civil Service (Medical): No    Lack of Transportation (Non-Medical): No  Physical Activity: Insufficiently Active (04/07/2024)   Exercise Vital Sign    Days of Exercise per Week: 3 days    Minutes of Exercise per Session: 30 min  Stress: No Stress Concern Present (04/07/2024)   [employer] of Occupational Health - Occupational Stress Questionnaire    Feeling of Stress: Not at all  Social Connections: Moderately Isolated (04/07/2024)   Social Connection and Isolation Panel    Frequency of Communication with Friends and Family: More than three times a week    Frequency of Social Gatherings with Friends and Family: Three times a week    Attends Religious Services: More than 4 times per year    Active Member of Clubs or Organizations: No    Attends Banker Meetings: Never    Marital Status: Divorced    Tobacco Counseling Counseling given: Not Answered Tobacco comments: No plans to start    Clinical Intake:  Pre-visit preparation completed: Yes  Pain : No/denies pain Pain Score: 0-No pain     BMI - recorded: 21.4 Nutritional Status: BMI of 19-24  Normal Nutritional Risks: None Diabetes: Yes CBG done?: No Did pt. bring in CBG monitor from home?: No  Lab Results  Component Value Date   HGBA1C 6.9 02/09/2024   HGBA1C 6.4 08/26/2023   HGBA1C 7.9 (A) 01/15/2023     How often do you need to have someone help you when you read instructions, pamphlets, or other written  materials from your doctor or pharmacy?: 1 - Never  Interpreter Needed?: No  Information entered by :: Kristyne Woodring N. Ilamae Geng, LPN.   Activities of Daily Living     04/07/2024    1:52 PM 04/17/2023   12:07 PM  In your present state of health, do you have any difficulty performing the following activities:  Hearing? 0 0  Vision? 0 0  Difficulty concentrating or making decisions? 0 0  Walking or climbing stairs? 0 0  Dressing or bathing? 0 0  Doing errands, shopping? 0 0  Preparing Food and eating ? N N  Using the Toilet? N N  In the past six months, have you accidently leaked urine? Y N  Do you have problems with loss of bowel control? N N  Managing your Medications? N N  Managing your Finances? N N  Housekeeping or managing your Housekeeping? N N    Patient Care Team: Cleotilde Perkins, DO as PCP - General (Family Medicine) Leslee Reusing, MD (Ophthalmology) Blanca Elsie RAMAN, MD (Cardiology) Levern Hutching, MD as Consulting Physician (Cardiology) Sheree Penne Bruckner, MD as Consulting Physician (Vascular Surgery) Mansouraty, Aloha Raddle., MD as Consulting Physician (Gastroenterology)  I have updated your Care Teams any recent Medical Services you may have received from other providers in the past year.     Assessment:   This is a routine wellness examination for Dana Coleman .  Hearing/Vision screen Hearing Screening - Comments:: Denies hearing difficulties.  Vision Screening - Comments:: Wears reading glasses - up to date with routine eye exams with Wanda Mae, MD.    Goals Addressed             This Visit's Progress    Patient Stated: To stay independent and increase physical activity.  I want to feel better and have more energy.         Depression Screen     04/07/2024    1:55 PM 02/09/2024    1:58 PM 11/11/2023    9:16 AM 08/26/2023    9:54 AM 04/17/2023   12:06 PM 04/14/2023    9:33 AM 03/23/2023    2:22 PM  PHQ 2/9 Scores  PHQ - 2 Score 0 0 0 0 0 0 0   PHQ- 9 Score   3 2       Fall Risk     04/07/2024    1:52 PM 03/09/2024   11:07 AM 02/09/2024    1:54 PM 11/11/2023    9:16 AM 08/26/2023    9:54 AM  Fall Risk   Falls in the past year? 0 0 0 0 0  Number falls in past yr: 0 0  0 0  Injury with Fall? 0 0  0 0  Risk for fall due to : No Fall Risks   Impaired balance/gait   Follow up Falls evaluation completed   Falls evaluation completed     MEDICARE RISK AT HOME:  Medicare Risk at Home Any stairs in or around the home?: No If so, are there any without handrails?: No Home free of loose throw rugs in walkways, pet beds, electrical cords, etc?: Yes Adequate lighting in your home to reduce risk of falls?: Yes Life alert?: No Use of a cane, walker or w/c?: Yes (CANE) Grab bars in the bathroom?: No Shower chair or bench in shower?: Yes (DON'T USE AT THIS TIME) Elevated toilet seat or a handicapped toilet?: No  TIMED UP AND GO:  Was the test performed?  No  Cognitive Function: Declined/Normal: No cognitive concerns noted by patient or family. Patient alert, oriented, able to answer questions appropriately and recall recent events. No signs of memory loss or confusion.    04/07/2024    1:55 PM 07/15/2018    2:15 PM 01/03/2014    2:00 PM  MMSE - Mini Mental State Exam  Not completed: Unable to complete    Orientation to time  5 5   Orientation to Place  5 5   Registration  3 3   Attention/ Calculation  5 4   Recall  3 3   Language- name 2 objects  2 2   Language- repeat  1 1  Language- follow 3 step command  3 3   Language- read & follow direction  1 1   Write a sentence  1 1   Copy design  1 1   Total score  30 29      Data saved with a previous flowsheet row definition        04/07/2024    2:00 PM 04/17/2023   12:07 PM 10/17/2021    2:00 PM 07/15/2018    2:15 PM  6CIT Screen  What Year? 0 points 0 points 0 points 0 points  What month? 0 points 0 points 0 points 0 points  What time? 0 points 0 points 0 points 0 points   Count back from 20 0 points 0 points 0 points 0 points  Months  in reverse 0 points 0 points 0 points 0 points  Repeat phrase 0 points 0 points 0 points 0 points  Total Score 0 points 0 points 0 points 0 points    Immunizations Immunization History  Administered Date(s) Administered   Fluad Quad(high Dose 65+) 08/08/2021, 07/13/2022   Influenza Split 06/30/2011   Influenza Whole 07/24/2008, 08/24/2009, 08/15/2010   Influenza,inj,Quad PF,6+ Mos 06/29/2013, 08/14/2014, 11/07/2014, 08/09/2015, 08/05/2016, 07/14/2017, 06/07/2019   Influenza-Unspecified 07/28/2020, 07/07/2023   PFIZER Comirnaty(Gray Top)Covid-19 Tri-Sucrose Vaccine 03/25/2021   PFIZER(Purple Top)SARS-COV-2 Vaccination 11/03/2019, 11/24/2019, 07/28/2020   Pfizer(Comirnaty)Fall Seasonal Vaccine 12 years and older 07/07/2023   Pneumococcal Conjugate-13 11/16/2015   Pneumococcal Polysaccharide-23 11/14/1999, 08/15/2010, 06/30/2011   Td 03/13/2006   Tdap 11/17/2017    Screening Tests Health Maintenance  Topic Date Due   COVID-19 Vaccine (6 - Pfizer risk 2024-25 season) 01/04/2024   Diabetic kidney evaluation - Urine ACR  01/15/2024   FOOT EXAM  02/12/2024   Zoster Vaccines- Shingrix (1 of 2) 05/10/2024 (Originally 03/16/1960)   INFLUENZA VACCINE  05/13/2024   OPHTHALMOLOGY EXAM  07/28/2024   HEMOGLOBIN A1C  08/10/2024   Diabetic kidney evaluation - eGFR measurement  10/20/2024   Medicare Annual Wellness (AWV)  04/07/2025   DTaP/Tdap/Td (3 - Td or Tdap) 11/18/2027   Pneumococcal Vaccine: 50+ Years  Completed   DEXA SCAN  Completed   Hepatitis B Vaccines  Aged Out   HPV VACCINES  Aged Out   Meningococcal B Vaccine  Aged Out   Colonoscopy  Discontinued   Hepatitis C Screening  Discontinued    Health Maintenance  Health Maintenance Due  Topic Date Due   COVID-19 Vaccine (6 - Pfizer risk 2024-25 season) 01/04/2024   Diabetic kidney evaluation - Urine ACR  01/15/2024   FOOT EXAM  02/12/2024   Health Maintenance  Items Addressed: Yes Patient aware of current care gaps.  Immunization record was verified by [employer].  Patient is due for the following: Diabetic Foot Exam, Diabetic Kidney Evaluation-Urine ACR.   Additional Screening:  Vision Screening: Recommended annual ophthalmology exams for early detection of glaucoma and other disorders of the eye. Would you like a referral to an eye doctor? No    Dental Screening: Recommended annual dental exams for proper oral hygiene  Community Resource Referral / Chronic Care Management: CRR required this visit?  No   CCM required this visit?  No   Plan:    I have personally reviewed and noted the following in the patient's chart:   Medical and social history Use of alcohol, tobacco or illicit drugs  Current medications and supplements including opioid prescriptions. Patient is not currently taking opioid prescriptions. Functional ability and status Nutritional status Physical activity Advanced directives List of other physicians Hospitalizations, surgeries, and ER visits in previous 12 months Vitals Screenings to include cognitive, depression, and falls Referrals and appointments  In addition, I have reviewed and discussed with patient certain preventive protocols, quality metrics, and best practice recommendations. A written personalized care plan for preventive services as well as general preventive health recommendations were provided to patient.   Dana LOISE Fuller, LPN   [id number]   After Visit Summary: (Declined) Due to this being a telephonic visit, with patients personalized plan was offered to patient but patient Declined AVS at this time   Notes: Patient aware of current care gaps.  Immunization record was verified by [employer].  Patient is due for the following: Diabetic Foot Exam, Diabetic Kidney Evaluation-Urine ACR.

## 2024-04-07 NOTE — Patient Instructions (Signed)
 [name] , Thank you for taking time out of your busy schedule to complete your Annual Wellness Visit with me. I enjoyed our conversation and look forward to speaking with you again next year. I, as well as your care team,  appreciate your ongoing commitment to your health goals. Please review the following plan we discussed and let me know if I can assist you in the future. Your Game plan/ To Do List    Referrals: If you haven't heard from the office you've been referred to, please reach out to them at the phone provided.   Follow up Visits: Next Medicare AWV with our clinical staff: 04/13/2025 at 1:30 pm phone visit with Nurse Health Advisor   Have you seen your provider in the last 6 months (3 months if uncontrolled diabetes)? Yes Next Office Visit with your provider: Office will call patient to schedule  Clinician Recommendations:  Aim for 30 minutes of exercise or brisk walking, 6-8 glasses of water , and 5 servings of fruits and vegetables each day.       This is a list of the screening recommended for you and due dates:  Health Maintenance  Topic Date Due   COVID-19 Vaccine (6 - Pfizer risk 2024-25 season) 01/04/2024   Yearly kidney health urinalysis for diabetes  01/15/2024   Complete foot exam   02/12/2024   Zoster (Shingles) Vaccine (1 of 2) 05/10/2024*   Flu Shot  05/13/2024   Eye exam for diabetics  07/28/2024   Hemoglobin A1C  08/10/2024   Yearly kidney function blood test for diabetes  10/20/2024   Medicare Annual Wellness Visit  04/07/2025   DTaP/Tdap/Td vaccine (3 - Td or Tdap) 11/18/2027   Pneumococcal Vaccine for age over 31  Completed   DEXA scan (bone density measurement)  Completed   Hepatitis B Vaccine  Aged Out   HPV Vaccine  Aged Out   Meningitis B Vaccine  Aged Out   Colon Cancer Screening  Discontinued   Hepatitis C Screening  Discontinued  *Topic was postponed. The date shown is not the original due date.    Advanced directives: (Declined) Advance  directive discussed with you today. Even though you declined this today, please call our office should you change your mind, and we can give you the proper paperwork for you to fill out. Advance Care Planning is important because it:  [x]  Makes sure you receive the medical care that is consistent with your values, goals, and preferences  [x]  It provides guidance to your family and loved ones and reduces their decisional burden about whether or not they are making the right decisions based on your wishes.  Follow the link provided in your after visit summary or read over the paperwork we have mailed to you to help you started getting your Advance Directives in place. If you need assistance in completing these, please reach out to us  so that we can help you!  See attachments for Preventive Care and Fall Prevention Tips.

## 2024-04-09 ENCOUNTER — Other Ambulatory Visit: Payer: Self-pay | Admitting: Student

## 2024-04-19 ENCOUNTER — Other Ambulatory Visit (HOSPITAL_COMMUNITY): Payer: Self-pay

## 2024-04-20 ENCOUNTER — Other Ambulatory Visit (HOSPITAL_COMMUNITY): Payer: Self-pay

## 2024-04-21 ENCOUNTER — Ambulatory Visit (INDEPENDENT_AMBULATORY_CARE_PROVIDER_SITE_OTHER): Admitting: Student

## 2024-04-21 VITALS — BP 121/71 | HR 78 | Ht 62.0 in | Wt 115.4 lb

## 2024-04-21 DIAGNOSIS — I10 Essential (primary) hypertension: Secondary | ICD-10-CM

## 2024-04-21 DIAGNOSIS — E1169 Type 2 diabetes mellitus with other specified complication: Secondary | ICD-10-CM

## 2024-04-21 NOTE — Assessment & Plan Note (Signed)
 Due to A1c within goal at 6.9 2 months ago, low readings on fasting morning sugars, shared decision making to discontinue insulin .  Continue Trulicity  1.5 mg weekly  Recheck A1c in 2-3 months to determine if we need ot make adjustments  Check urine microalbumin today

## 2024-04-21 NOTE — Patient Instructions (Signed)
 Due to your A1c being in a good range, lets discontinue your insulin .   Come back to have your A1c rechecked in 2-3 months to make sure we don't need to make any adjustments.

## 2024-04-21 NOTE — Progress Notes (Signed)
    SUBJECTIVE:   CHIEF COMPLAINT / HPI:   Dana  W Coleman is a 83 y.o. female presenting for diabetes follow-up.  Type II diabetes: Medications: Trulicity  1.5 mg weekly, glargine 5 units daily Last A1c 2 months ago: 6.9 She checks her sugars regularly at home and has had a few readings below 100 fasting.   PERTINENT  PMH / PSH: reviewed and updated.  OBJECTIVE:   BP 121/71   Pulse 78   Ht 5' 2 (1.575 m)   Wt 115 lb 6.4 oz (52.3 kg)   SpO2 99%   BMI 21.11 kg/m   Well-appearing, no acute distress Cardio: Regular rate, regular rhythm, no murmurs on exam. Pulm: Clear, no wheezing, no crackles. No increased work of breathing Abdominal: bowel sounds present, soft, non-tender, non-distended Extremities: no peripheral edema  Neuro: alert and oriented x3, speech normal in content, no facial asymmetry, strength intact and equal bilaterally in UE and LE, pupils equal and reactive to light.  Psych:  Cognition and judgment appear intact. Alert, communicative  and cooperative with normal attention span and concentration. No apparent delusions, illusions, hallucinations    ASSESSMENT/PLAN:   Assessment & Plan Type 2 diabetes mellitus with other specified complication, unspecified whether long term insulin  use (HCC) Due to A1c within goal at 6.9 2 months ago, low readings on fasting morning sugars, shared decision making to discontinue insulin .  Continue Trulicity  1.5 mg weekly  Recheck A1c in 2-3 months to determine if we need ot make adjustments  Check urine microalbumin today  Hypertension Blood pressure well-controlled today.  Continue Entresto , Toprol  25 mg daily, spironolactone  12.5 mg daily     Damien Pinal, DO [facility]

## 2024-04-21 NOTE — Assessment & Plan Note (Addendum)
 Blood pressure well-controlled today.  Continue Entresto , Toprol  25 mg daily, spironolactone  12.5 mg daily

## 2024-04-22 LAB — MICROALBUMIN / CREATININE URINE RATIO
Creatinine, Urine: 32 mg/dL
Microalb/Creat Ratio: 39 mg/g{creat} — ABNORMAL HIGH (ref 0–29)
Microalbumin, Urine: 12.4 ug/mL

## 2024-05-01 ENCOUNTER — Other Ambulatory Visit: Payer: Self-pay | Admitting: Student

## 2024-05-15 ENCOUNTER — Other Ambulatory Visit: Payer: Self-pay | Admitting: Student

## 2024-05-15 DIAGNOSIS — E1169 Type 2 diabetes mellitus with other specified complication: Secondary | ICD-10-CM

## 2024-05-27 ENCOUNTER — Other Ambulatory Visit: Payer: Self-pay | Admitting: Student

## 2024-05-27 DIAGNOSIS — Z1231 Encounter for screening mammogram for malignant neoplasm of breast: Secondary | ICD-10-CM

## 2024-05-29 ENCOUNTER — Other Ambulatory Visit: Payer: Self-pay | Admitting: Student

## 2024-05-29 DIAGNOSIS — E785 Hyperlipidemia, unspecified: Secondary | ICD-10-CM

## 2024-05-31 ENCOUNTER — Other Ambulatory Visit: Payer: Self-pay | Admitting: Student

## 2024-05-31 ENCOUNTER — Other Ambulatory Visit (HOSPITAL_COMMUNITY): Payer: Self-pay

## 2024-05-31 ENCOUNTER — Other Ambulatory Visit: Payer: Self-pay

## 2024-05-31 DIAGNOSIS — E1169 Type 2 diabetes mellitus with other specified complication: Secondary | ICD-10-CM

## 2024-05-31 MED ORDER — TRULICITY 1.5 MG/0.5ML ~~LOC~~ SOAJ
1.5000 mg | SUBCUTANEOUS | 5 refills | Status: AC
  Filled 2024-05-31: qty 2, 28d supply, fill #0
  Filled 2024-06-23 – 2024-07-05 (×2): qty 2, 28d supply, fill #1
  Filled 2024-07-07: qty 2, 28d supply, fill #0
  Filled 2024-07-28 – 2024-07-29 (×2): qty 2, 28d supply, fill #1
  Filled 2024-08-25 – 2024-09-06 (×3): qty 2, 28d supply, fill #2
  Filled 2024-09-27 – 2024-10-10 (×3): qty 2, 28d supply, fill #3
  Filled 2024-10-12 – 2024-11-14 (×3): qty 2, 28d supply, fill #4

## 2024-06-01 ENCOUNTER — Other Ambulatory Visit: Payer: Self-pay

## 2024-06-02 ENCOUNTER — Other Ambulatory Visit (HOSPITAL_COMMUNITY): Payer: Self-pay

## 2024-06-17 ENCOUNTER — Ambulatory Visit
Admission: RE | Admit: 2024-06-17 | Discharge: 2024-06-17 | Disposition: A | Discharge status: Home / Self Care | Source: Ambulatory Visit | Attending: Family Medicine | Admitting: Family Medicine

## 2024-06-17 DIAGNOSIS — Z1231 Encounter for screening mammogram for malignant neoplasm of breast: Secondary | ICD-10-CM

## 2024-06-21 ENCOUNTER — Other Ambulatory Visit (HOSPITAL_COMMUNITY): Payer: Self-pay | Admitting: Family Medicine

## 2024-06-22 ENCOUNTER — Encounter: Payer: Self-pay | Admitting: Student

## 2024-06-22 ENCOUNTER — Other Ambulatory Visit: Payer: Self-pay | Admitting: Student

## 2024-06-22 ENCOUNTER — Ambulatory Visit (HOSPITAL_BASED_OUTPATIENT_CLINIC_OR_DEPARTMENT_OTHER)
Admission: RE | Admit: 2024-06-22 | Discharge: 2024-06-22 | Discharge status: Home / Self Care | Source: Ambulatory Visit | Attending: Vascular Surgery

## 2024-06-22 ENCOUNTER — Ambulatory Visit (HOSPITAL_COMMUNITY)
Admission: RE | Admit: 2024-06-22 | Discharge: 2024-06-22 | Disposition: A | Discharge status: Home / Self Care | Source: Ambulatory Visit | Attending: Vascular Surgery | Admitting: Vascular Surgery

## 2024-06-22 ENCOUNTER — Encounter: Payer: Self-pay | Admitting: Vascular Surgery

## 2024-06-22 ENCOUNTER — Ambulatory Visit (INDEPENDENT_AMBULATORY_CARE_PROVIDER_SITE_OTHER): Admitting: Vascular Surgery

## 2024-06-22 VITALS — BP 152/78 | HR 73 | Temp 97.8°F | Ht 62.0 in | Wt 115.0 lb

## 2024-06-22 DIAGNOSIS — I739 Peripheral vascular disease, unspecified: Secondary | ICD-10-CM

## 2024-06-22 DIAGNOSIS — N63 Unspecified lump in unspecified breast: Secondary | ICD-10-CM

## 2024-06-22 DIAGNOSIS — I70219 Atherosclerosis of native arteries of extremities with intermittent claudication, unspecified extremity: Secondary | ICD-10-CM

## 2024-06-22 DIAGNOSIS — I70212 Atherosclerosis of native arteries of extremities with intermittent claudication, left leg: Secondary | ICD-10-CM

## 2024-06-22 LAB — VAS US ABI WITH/WO TBI
Left ABI: 0.62
Right ABI: 1.09

## 2024-06-22 NOTE — Progress Notes (Signed)
 Patient ID: Dana Coleman, female   DOB: 02-08-1941, 83 y.o.   MRN: [id number]  Reason for Consult: Follow-up   Referred by Cleotilde Perkins, DO  Subjective:     HPI:  Dana Coleman is a 83 y.o. female follows up for claudication evaluation.  She was a smoker when she was very young but mostly has not been a smoker.  She is diabetic and also has hyperlipidemia and also takes Eliquis .  She states that since our last visit she can maybe walk between 1 and 2 blocks prior to stopping which is due to pain in her left calf.  She does not have any tissue loss or ulceration.  No previous stroke, TIA or amaurosis.  She does not have pain in the middle of the night.  Past Medical History:  Diagnosis Date   Acute pulmonary edema (HCC)    Acute respiratory failure with hypoxemia (HCC)    Acute respiratory failure with hypoxia (HCC)    Allergy    occ uses OTC allergy meds    Breast cancer (HCC) 04/2008   Right breast   Cataracts, bilateral    removed bilat    Cervical cancer (HCC)    When the patient was in her 30s   Depression    Empyema (HCC)    Esophageal perforation 05/05/2021   Failure to thrive in adult 07/22/2021   Wt Readings from Last 3 Encounters: 06/26/21 65.8 kg 06/17/21 64.8 kg 04/26/21 58.1 kg     Gastrostomy tube in place [facility]) 08/21/2021   Heart attack (HCC) 2004   History of cervical cancer    S/p radiation and surgical removal in 30's.  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 Melodye   PEG (percutaneous endoscopic gastrostomy) status (HCC) 07/23/2021   Personal history of radiation therapy 2009   Pressure injury of skin 05/05/2021   Protein-calorie malnutrition, severe (HCC) 05/15/2021   Pyelonephritis 11/06/2021    S/P thoracotomy    Family History  Problem Relation Age of Onset   Heart disease Mother        age 36   Heart disease Father    Cancer Father        unknown   Heart disease Brother    Hypertension Brother    Cirrhosis Brother    Diabetes Daughter    Stroke Daughter    Kidney disease Daughter    Hypertension Son    Colon cancer Neg Hx    Colon polyps Neg Hx    Esophageal cancer Neg Hx    Stomach cancer Neg Hx    Rectal cancer Neg Hx    Inflammatory bowel disease Neg Hx    Liver disease Neg Hx    Pancreatic cancer Neg Hx    Past Surgical History:  Procedure Laterality Date   BIOPSY  09/21/2023   Procedure: BIOPSY;  Surgeon: Wilhelmenia Aloha Raddle., MD;  Location: [facility];  Service: Gastroenterology;;   BREAST LUMPECTOMY Right 2009   BREAST SURGERY  2009   right lumpectomy   CERVIX SURGERY  1980   CHOLECYSTECTOMY N/A 05/02/2021   Procedure: LAPAROSCOPIC CHOLECYSTECTOMY;  Surgeon: Tanda Locus, MD;  Location: [facility];  Service: General;  Laterality: N/A;  COLONOSCOPY     ESOPHAGOGASTRODUODENOSCOPY (EGD) WITH PROPOFOL  N/A 09/21/2023   Procedure: ESOPHAGOGASTRODUODENOSCOPY (EGD) WITH PROPOFOL ;  Surgeon: Wilhelmenia Aloha Raddle., MD;  Location: [facility];  Service: Gastroenterology;  Laterality: N/A;   IR GASTROSTOMY TUBE MOD SED  06/03/2021   IR GASTROSTOMY TUBE REMOVAL  10/08/2021   LEFT HEART CATHETERIZATION WITH CORONARY ANGIOGRAM N/A 04/20/2014   Procedure: LEFT HEART CATHETERIZATION WITH CORONARY ANGIOGRAM;  Surgeon: Elsie GORMAN Somerset, MD;  Location: [facility] CATH LAB;  Service: Cardiovascular;  Laterality: N/A;   POLYPECTOMY     POLYPECTOMY  09/21/2023   Procedure: POLYPECTOMY;  Surgeon: Wilhelmenia Aloha Raddle., MD;  Location: [facility];  Service: Gastroenterology;;   HARLEY DILATION N/A 09/21/2023   Procedure: HARLEY HODGKIN;  Surgeon: Wilhelmenia Aloha Raddle., MD;  Location: [facility];  Service: Gastroenterology;  Laterality: N/A;   STENT PLACEMENT VASCULAR ([facility] HX)      THORACOTOMY Right 05/05/2021   Procedure: THORACOTOMY MAJOR REPAIR PERFORATED ESOPHAGUS;  Surgeon: Lucas Dorise POUR, MD;  Location: [facility];  Service: Thoracic;  Laterality: Right;   thyroid      for nodule hemithyroidectomy, benign   THYROIDECTOMY Right 03/2003    Short Social History:  Social History   Tobacco Use   Smoking status: Never   Smokeless tobacco: Never   Tobacco comments:    No plans to start  Substance Use Topics   Alcohol use: No    No Known Allergies  Current Outpatient Medications  Medication Sig Dispense Refill   ascorbic acid (VITAMIN C) 500 MG tablet Take 500 mg by mouth daily.     azelastine  (ASTELIN ) 0.1 % nasal spray Place 2 sprays into both nostrils 2 (two) times daily. Use in each nostril as directed 30 mL 12   bisacodyl (DULCOLAX) 5 MG EC tablet Take 5 mg by mouth every other day.     cyanocobalamin  (VITAMIN B12) 1000 MCG tablet Take 1 tablet (1,000 mcg total) by mouth daily. 30 tablet 0   Dulaglutide  (TRULICITY ) 1.5 MG/0.5ML SOAJ Inject 1.5 mg into the skin once a week. 2 mL 5   ELIQUIS  2.5 MG TABS tablet TAKE 1 TABLET BY MOUTH TWICE A DAY 60 tablet 8   ferrous sulfate (SLOW IRON ) 160 (50 Fe) MG TBCR SR tablet Take 1 tablet (160 mg total) by mouth daily. 30 tablet 0   fluticasone  (FLONASE ) 50 MCG/ACT nasal spray Place 1 spray into both nostrils daily as needed for allergies or rhinitis. 1 spray in each nostril every day 16 g 12   furosemide  (LASIX ) 80 MG tablet Take 40 mg by mouth daily.     Melatonin 3 MG TBDP Take 6 mg by mouth at bedtime as needed (sleep).     metoprolol  succinate (TOPROL -XL) 25 MG 24 hr tablet TAKE 1/2 TABLET BY MOUTH DAILY 45 tablet 3   nitroGLYCERIN  (NITROSTAT ) 0.4 MG SL tablet Place 1 tablet (0.4 mg total) under the tongue every 5 (five) minutes as needed for chest pain. 30 tablet 2   omeprazole  (PRILOSEC) 40 MG capsule TAKE 1 CAPSULE BY MOUTH 2 TIMES DAILY BEFORE A MEAL. 180 capsule 1   rosuvastatin  (CRESTOR ) 20 MG tablet TAKE 1  TABLET BY MOUTH EVERYDAY AT BEDTIME 90 tablet 1   sacubitril -valsartan  (ENTRESTO ) 24-26 MG TAKE 1 TABLET BY MOUTH TWICE A DAY 90 tablet 3   spironolactone  (ALDACTONE ) 25 MG tablet TAKE 1/2 TABLET BY MOUTH DAILY 45 tablet 3   traZODone  (DESYREL ) 50 MG tablet TAKE 1 TABLET BY MOUTH EVERY DAY AT BEDTIME AS  NEEDED FOR SLEEP 90 tablet 1   No current facility-administered medications for this visit.    Review of Systems  Constitutional:  Constitutional negative. HENT: HENT negative.  Eyes: Eyes negative.  Cardiovascular: Positive for claudication.  GI: Gastrointestinal negative.  Musculoskeletal: Musculoskeletal negative.  Skin: Skin negative.  Neurological: Neurological negative. Hematologic: Hematologic/lymphatic negative.  Psychiatric: Psychiatric negative.        Objective:  Objective   Vitals:   06/22/24 1128  BP: (!) 152/78  Pulse: 73  Temp: 97.8 F (36.6 C)  SpO2: 95%  Weight: 115 lb (52.2 kg)  Height: 5' 2 (1.575 m)   Body mass index is 21.03 kg/m.  Physical Exam HENT:     Head: Normocephalic.     Mouth/Throat:     Mouth: Mucous membranes are moist.  Eyes:     Pupils: Pupils are equal, round, and reactive to light.  Neck:     Vascular: No carotid bruit.  Cardiovascular:     Pulses:          Femoral pulses are 2+ on the right side and 0 on the left side.      Popliteal pulses are 0 on the left side.       Dorsalis pedis pulses are 2+ on the right side and 0 on the left side.     Heart sounds: Murmur heard.  Pulmonary:     Effort: Pulmonary effort is normal.     Breath sounds: Normal breath sounds.  Abdominal:     General: Abdomen is flat.     Palpations: Abdomen is soft. There is no mass.  Skin:    Capillary Refill: Capillary refill takes 2 to 3 seconds.  Neurological:     General: No focal deficit present.     Mental Status: She is alert.  Psychiatric:        Mood and Affect: Mood normal.     Data: LEFT       PSV cm/sRatioStenosis        Waveform  Comments    +-----------+--------+-----+---------------+----------+----------+  CFA Distal 435          75-99% stenosismonophasic            +-----------+--------+-----+---------------+----------+----------+  DFA       41                          monophasicretrograde  +-----------+--------+-----+---------------+----------+----------+  SFA Prox   32                          monophasic            +-----------+--------+-----+---------------+----------+----------+  SFA Mid    22                          monophasic            +-----------+--------+-----+---------------+----------+----------+  SFA Distal 19                          monophasic            +-----------+--------+-----+---------------+----------+----------+  POP Prox   19                          monophasic            +-----------+--------+-----+---------------+----------+----------+  POP Distal 12  monophasic            +-----------+--------+-----+---------------+----------+----------+  TP Trunk   9                           monophasic            +-----------+--------+-----+---------------+----------+----------+  ATA Distal                                       NWV         +-----------+--------+-----+---------------+----------+----------+  PTA Distal 13                          monophasic            +-----------+--------+-----+---------------+----------+----------+  PERO Distal9                           monophasic            +-----------+--------+-----+---------------+----------+----------+        Summary:  Left: 75-99% stenosis noted in the common femoral artery.   ABI Findings:  +---------+------------------+-----+---------+--------+  Right   Rt Pressure (mmHg)IndexWaveform Comment   +---------+------------------+-----+---------+--------+  Brachial 174                                        +---------+------------------+-----+---------+--------+  PTA     198               1.09 triphasic          +---------+------------------+-----+---------+--------+  DP      193               1.07 triphasic          +---------+------------------+-----+---------+--------+  Great Toe154               0.85                    +---------+------------------+-----+---------+--------+   +---------+------------------+-----+-------------------+-------+  Left    Lt Pressure (mmHg)IndexWaveform           Comment  +---------+------------------+-----+-------------------+-------+  Brachial 181                                                +---------+------------------+-----+-------------------+-------+  PTA     112               0.62 monophasic                  +---------+------------------+-----+-------------------+-------+  DP      111               0.61 dampened monophasic         +---------+------------------+-----+-------------------+-------+  Great Toe68                0.38                             +---------+------------------+-----+-------------------+-------+   +-------+-----------+-----------+------------+------------+  ABI/TBIToday's ABIToday's TBIPrevious ABIPrevious TBI  +-------+-----------+-----------+------------+------------+  Right 1.09       0.85  1.15        0.71          +-------+-----------+-----------+------------+------------+  Left  0.62       0.38       0.69        0.24          +-------+-----------+-----------+------------+------------+       Bilateral ABIs appear essentially unchanged compared to prior study on  12/23/23.    Summary:  Right: Resting right ankle-brachial index is within normal range. The  right toe-brachial index is normal.    Left: Resting left ankle-brachial index indicates moderate left lower  extremity arterial disease.  Left toe pressure is >60 mmHg which suggests  adequate perfusion for  healing.       Assessment/Plan:     83 year old female with short distance left lower extremity claudication at 1-2 blocks which appears to be secondary to high-grade left common femoral artery stenosis.  She does not have a left common femoral artery pulse does have palpable pulses throughout the right lower extremity.  We have discussed continuing walking program she will follow-up in 1 year with repeat noninvasive studies.  If her symptoms worsen she can call to be evaluated sooner or schedule angiogram from right common femoral approach as we discussed her options today.  Penne Lonni Colorado MD [facility]

## 2024-07-01 DIAGNOSIS — I5022 Chronic systolic (congestive) heart failure: Secondary | ICD-10-CM | POA: Diagnosis not present

## 2024-07-01 DIAGNOSIS — I255 Ischemic cardiomyopathy: Secondary | ICD-10-CM | POA: Diagnosis not present

## 2024-07-01 DIAGNOSIS — I25118 Atherosclerotic heart disease of native coronary artery with other forms of angina pectoris: Secondary | ICD-10-CM | POA: Diagnosis not present

## 2024-07-01 DIAGNOSIS — I2699 Other pulmonary embolism without acute cor pulmonale: Secondary | ICD-10-CM | POA: Diagnosis not present

## 2024-07-04 ENCOUNTER — Other Ambulatory Visit (HOSPITAL_COMMUNITY): Payer: Self-pay

## 2024-07-07 ENCOUNTER — Other Ambulatory Visit: Payer: Self-pay

## 2024-07-07 ENCOUNTER — Other Ambulatory Visit (HOSPITAL_COMMUNITY): Payer: Self-pay

## 2024-07-08 ENCOUNTER — Other Ambulatory Visit (HOSPITAL_COMMUNITY): Payer: Self-pay

## 2024-07-19 ENCOUNTER — Telehealth: Payer: Self-pay

## 2024-07-19 NOTE — Telephone Encounter (Signed)
 Pt called stating she feels weak and dizzy. She has been advised to call her PCP today and schedule an appt with them and to call us  back if she needs further assistance from us . Pt verbalized understanding.

## 2024-07-20 ENCOUNTER — Ambulatory Visit: Admitting: Family Medicine

## 2024-07-20 VITALS — BP 131/88 | HR 89 | Ht 62.0 in | Wt 115.2 lb

## 2024-07-20 DIAGNOSIS — E1169 Type 2 diabetes mellitus with other specified complication: Secondary | ICD-10-CM | POA: Diagnosis not present

## 2024-07-20 DIAGNOSIS — R5383 Other fatigue: Secondary | ICD-10-CM

## 2024-07-20 LAB — POCT GLYCOSYLATED HEMOGLOBIN (HGB A1C): HbA1c, POC (controlled diabetic range): 7 % (ref 0.0–7.0)

## 2024-07-20 NOTE — Patient Instructions (Signed)
 1) I think your symptoms are most likely related to depression and anxiety. - We will check your thyroid  and hemoglobin level first. If these are normal, then we can start treatment for depression and anxiety.  - Follow-up with us  in 1 week. We can review your labs and discuss treatment options that would be best suited for you.

## 2024-07-20 NOTE — Progress Notes (Signed)
    SUBJECTIVE:   CHIEF COMPLAINT / HPI:   VT is a 83yo F that pf fatigue - Feels like she's been fatigued and tired for past 1 month - Denies blood in stool - Fasting BG yesterday was 123 - Reports feeling colder than those around her. - Denies SOB, cough, fevers - Denies recent travel - Reports decreased appetite - Reports trouble falling asleep and staying asleep - Reports she no longer feeling interested in things she used to like doing. She felt like she couldn't go to church because she didn't feel good.  - Reports sometimes feeling dizzy, especially in morning.  - Denies chest pain, LE swelling - Denies concentration issues - Denies SI  PERTINENT  PMH / PSH: T2DM  OBJECTIVE:   BP (!) 157/79   Pulse 89   Ht 5' 2 (1.575 m)   Wt 115 lb 3.2 oz (52.3 kg)   SpO2 100%   BMI 21.07 kg/m   General: Alert, pleasant woman. NAD. HEENT: NCAT. MMM. CV: RRR, no murmurs.  Resp: CTAB, no wheezing or crackles. Normal WOB on RA.  Abm: Soft, nontender, nondistended. BS present. Ext: Moves all ext spontaneously Skin: Warm, well perfused   ASSESSMENT/PLAN:   Assessment & Plan Other fatigue Differential includes depression, hypothyroidism, anemia. Will check TSH, CBC today. Pt reports that she is not taking trazadone currently. - Recommend f/u in 1 wk to review results and disucssed potentially starting SSRI at that time for depressive symptoms Type 2 diabetes mellitus with other specified complication, unspecified whether long term insulin  use (HCC) A1c today wnl.     Twyla Nearing, MD [facility]

## 2024-07-21 LAB — CBC
Hematocrit: 40.7 % (ref 34.0–46.6)
Hemoglobin: 12.9 g/dL (ref 11.1–15.9)
MCH: 31.1 pg (ref 26.6–33.0)
MCHC: 31.7 g/dL (ref 31.5–35.7)
MCV: 98 fL — ABNORMAL HIGH (ref 79–97)
Platelets: 132 x10E3/uL — ABNORMAL LOW (ref 150–450)
RBC: 4.15 x10E6/uL (ref 3.77–5.28)
RDW: 12.4 % (ref 11.7–15.4)
WBC: 6.1 x10E3/uL (ref 3.4–10.8)

## 2024-07-21 LAB — TSH: TSH: 1.14 u[IU]/mL (ref 0.450–4.500)

## 2024-07-22 DIAGNOSIS — H3500 Unspecified background retinopathy: Secondary | ICD-10-CM | POA: Diagnosis not present

## 2024-07-22 DIAGNOSIS — H52203 Unspecified astigmatism, bilateral: Secondary | ICD-10-CM | POA: Diagnosis not present

## 2024-07-22 DIAGNOSIS — Z961 Presence of intraocular lens: Secondary | ICD-10-CM | POA: Diagnosis not present

## 2024-07-22 DIAGNOSIS — E119 Type 2 diabetes mellitus without complications: Secondary | ICD-10-CM | POA: Diagnosis not present

## 2024-07-22 LAB — OPHTHALMOLOGY REPORT-SCANNED

## 2024-07-23 NOTE — Assessment & Plan Note (Signed)
 A1c today wnl.

## 2024-07-25 ENCOUNTER — Ambulatory Visit: Payer: Self-pay | Admitting: Family Medicine

## 2024-07-27 ENCOUNTER — Ambulatory Visit (INDEPENDENT_AMBULATORY_CARE_PROVIDER_SITE_OTHER): Admitting: Student

## 2024-07-27 ENCOUNTER — Encounter: Payer: Self-pay | Admitting: Student

## 2024-07-27 VITALS — BP 120/70 | HR 73 | Ht 62.0 in | Wt 117.4 lb

## 2024-07-27 DIAGNOSIS — Z23 Encounter for immunization: Secondary | ICD-10-CM | POA: Diagnosis not present

## 2024-07-27 DIAGNOSIS — F5101 Primary insomnia: Secondary | ICD-10-CM

## 2024-07-27 NOTE — Assessment & Plan Note (Signed)
 Discussed trialing melatonin.  If this benefits her she can stop taking the trazodone .  Also discussed continuing grief counseling.

## 2024-07-27 NOTE — Patient Instructions (Addendum)
 It was great to see you today!   Melatonin 2.5 -5 mg try this 30-60 minutes before going to bed    Future Appointments  Date Time Provider Department Center  08/01/2024  2:00 PM [facility] DIAG TOMO 1 [facility] CE  08/01/2024  2:10 PM [facility] US  1 [facility] GI-BREAST CE  08/23/2024  1:30 PM Mansouraty, Aloha Raddle., MD [facility]  04/13/2025  1:30 PM [facility] ANNUAL WELLNESS VISIT [facility]    Please arrive 15 minutes before your appointment to ensure smooth check in process.  If you are more than 15 minutes late, you may be asked to reschedule.   Please bring a list of your medications with you to all appointments.   Please call the clinic at [phone] if your symptoms worsen or you have any concerns.  Thank you for allowing me to participate in your care, Dr. Damien Pinal [facility]

## 2024-07-27 NOTE — Progress Notes (Signed)
    SUBJECTIVE:   CHIEF COMPLAINT / HPI:   Dana  W Coleman is a 83 y.o. female presenting for lab follow-up and insomnia.  Patient's sister was in attendance and assisted with HPI.   Insomnia exacerbated by recent family members death.  Has tried trazodone  50 mg without significant improvement.  Would like to try melatonin as needed.  PERTINENT  PMH / PSH: reviewed and updated.  OBJECTIVE:   BP 120/70   Pulse 73   Ht 5' 2 (1.575 m)   Wt 117 lb 6 oz (53.2 kg)   SpO2 99%   BMI 21.47 kg/m   Well-appearing, no acute distress Cardio: Regular rate, regular rhythm, no murmurs on exam. Pulm: Clear, no wheezing, no crackles. No increased work of breathing Abdominal: bowel sounds present, soft, non-tender, non-distended Extremities: no peripheral edema  Neuro: alert and oriented x3, speech normal in content, no facial asymmetry, strength intact and equal bilaterally in UE and LE, pupils equal and reactive to light.  Psych:  Cognition and judgment appear intact. Alert, communicative  and cooperative with normal attention span and concentration. No apparent delusions, illusions, hallucinations    ASSESSMENT/PLAN:   Assessment & Plan Primary insomnia Discussed trialing melatonin.  If this benefits her she can stop taking the trazodone .  Also discussed continuing grief counseling. Encounter for immunization COVID shot given today     Damien Pinal, DO [facility]

## 2024-07-29 ENCOUNTER — Other Ambulatory Visit (HOSPITAL_COMMUNITY): Payer: Self-pay

## 2024-08-01 ENCOUNTER — Ambulatory Visit
Admission: RE | Admit: 2024-08-01 | Discharge: 2024-08-01 | Disposition: A | Source: Ambulatory Visit | Attending: Internal Medicine | Admitting: Internal Medicine

## 2024-08-01 ENCOUNTER — Ambulatory Visit
Admission: RE | Admit: 2024-08-01 | Discharge: 2024-08-01 | Disposition: A | Discharge status: Home / Self Care | Source: Ambulatory Visit | Attending: Internal Medicine | Admitting: Internal Medicine

## 2024-08-01 ENCOUNTER — Encounter: Payer: Self-pay | Admitting: Student

## 2024-08-01 ENCOUNTER — Other Ambulatory Visit: Payer: Self-pay | Admitting: Student

## 2024-08-01 DIAGNOSIS — N6320 Unspecified lump in the left breast, unspecified quadrant: Secondary | ICD-10-CM | POA: Diagnosis not present

## 2024-08-01 DIAGNOSIS — N63 Unspecified lump in unspecified breast: Secondary | ICD-10-CM

## 2024-08-01 DIAGNOSIS — N6310 Unspecified lump in the right breast, unspecified quadrant: Secondary | ICD-10-CM | POA: Diagnosis not present

## 2024-08-01 DIAGNOSIS — R928 Other abnormal and inconclusive findings on diagnostic imaging of breast: Secondary | ICD-10-CM | POA: Diagnosis not present

## 2024-08-01 DIAGNOSIS — N644 Mastodynia: Secondary | ICD-10-CM | POA: Diagnosis not present

## 2024-08-02 ENCOUNTER — Other Ambulatory Visit: Payer: Self-pay | Admitting: Student

## 2024-08-02 ENCOUNTER — Other Ambulatory Visit: Payer: Self-pay | Admitting: Internal Medicine

## 2024-08-02 DIAGNOSIS — E785 Hyperlipidemia, unspecified: Secondary | ICD-10-CM | POA: Diagnosis not present

## 2024-08-02 DIAGNOSIS — N6320 Unspecified lump in the left breast, unspecified quadrant: Secondary | ICD-10-CM

## 2024-08-02 DIAGNOSIS — E119 Type 2 diabetes mellitus without complications: Secondary | ICD-10-CM | POA: Diagnosis not present

## 2024-08-02 DIAGNOSIS — I5022 Chronic systolic (congestive) heart failure: Secondary | ICD-10-CM | POA: Diagnosis not present

## 2024-08-02 DIAGNOSIS — I10 Essential (primary) hypertension: Secondary | ICD-10-CM | POA: Diagnosis not present

## 2024-08-02 DIAGNOSIS — I2510 Atherosclerotic heart disease of native coronary artery without angina pectoris: Secondary | ICD-10-CM | POA: Diagnosis not present

## 2024-08-05 ENCOUNTER — Ambulatory Visit
Admission: RE | Admit: 2024-08-05 | Discharge: 2024-08-05 | Disposition: A | Discharge status: Home / Self Care | Source: Ambulatory Visit | Attending: Internal Medicine | Admitting: Internal Medicine

## 2024-08-05 DIAGNOSIS — N6321 Unspecified lump in the left breast, upper outer quadrant: Secondary | ICD-10-CM | POA: Diagnosis not present

## 2024-08-05 DIAGNOSIS — N6320 Unspecified lump in the left breast, unspecified quadrant: Secondary | ICD-10-CM

## 2024-08-05 DIAGNOSIS — N6032 Fibrosclerosis of left breast: Secondary | ICD-10-CM | POA: Diagnosis not present

## 2024-08-05 HISTORY — PX: BREAST BIOPSY: SHX20

## 2024-08-08 LAB — SURGICAL PATHOLOGY

## 2024-08-23 ENCOUNTER — Encounter: Payer: Self-pay | Admitting: Gastroenterology

## 2024-08-23 ENCOUNTER — Ambulatory Visit: Admitting: Gastroenterology

## 2024-08-23 VITALS — BP 136/74 | HR 64 | Ht 62.0 in | Wt 116.4 lb

## 2024-08-23 DIAGNOSIS — R1319 Other dysphagia: Secondary | ICD-10-CM | POA: Diagnosis not present

## 2024-08-23 DIAGNOSIS — K31A0 Gastric intestinal metaplasia, unspecified: Secondary | ICD-10-CM | POA: Diagnosis not present

## 2024-08-23 DIAGNOSIS — K5904 Chronic idiopathic constipation: Secondary | ICD-10-CM

## 2024-08-23 MED ORDER — OMEPRAZOLE 40 MG PO CPDR: 40.0000 mg | DELAYED_RELEASE_CAPSULE | Freq: Every day | ORAL | 2 refills | Status: DC

## 2024-08-23 NOTE — Patient Instructions (Signed)
 We have sent the following medications to your pharmacy for you to pick up at your convenience: Omeprazole    _______________________________________________________  If your blood pressure at your visit was 140/90 or greater, please contact your primary care physician to follow up on this.  _______________________________________________________  If you are age 82 or older, your body mass index should be between 23-30. Your Body mass index is 21.29 kg/m. If this is out of the aforementioned range listed, please consider follow up with your Primary Care Provider.  If you are age 69 or younger, your body mass index should be between 19-25. Your Body mass index is 21.29 kg/m. If this is out of the aformentioned range listed, please consider follow up with your Primary Care Provider.   ________________________________________________________  The [facility] providers would like to encourage you to use MYCHART to communicate with providers for non-urgent requests or questions.  Due to long hold times on the telephone, sending your provider a message by [facility] may be a faster and more efficient way to get a response.  Please allow 48 business hours for a response.  Please remember that this is for non-urgent requests.  _______________________________________________________  [facility] is using a team-based approach to care.  Your team is made up of your doctor and two to three APPS. Our APPS (Nurse Practitioners and Physician Assistants) work with your physician to ensure care continuity for you. They are fully qualified to address your health concerns and develop a treatment plan. They communicate directly with your gastroenterologist to care for you. Seeing the Advanced Practice Practitioners on your physician's team can help you by facilitating care more promptly, often allowing for earlier appointments, access to diagnostic testing, procedures, and other specialty referrals.    Thank you for choosing me and [facility].  Dr. Wilhelmenia

## 2024-08-23 NOTE — Progress Notes (Signed)
 GASTROENTEROLOGY OUTPATIENT CLINIC VISIT   Primary Care Provider Cleotilde Perkins, DO [address] [place] [address] [phone]  Patient Profile: Dana Coleman is a 83 y.o. female with a pmh significant for CHF (EF 30-35%), atrial fibrillation, hypertension, hyperlipidemia, diabetes, prior breast cancer, prior cervical cancer, status postcholecystectomy, previous esophageal perforation status post surgical treatment, GERD, esophageal stricture post dilation), esophageal diverticulum, GIM, colon polyps (TA's).  The patient presents to the [facility] for an evaluation and management of problem(s) noted below:  Problem List 1. Gastric intestinal metaplasia   2. Chronic idiopathic constipation   3. Esophageal dysphagia    Discussed the use of AI scribe software for clinical note transcription with the patient, who gave verbal consent to proceed.  History of Present Illness Please see prior GI notes for full details of HPI.  Interval History Dana Coleman is an 83 year old female who presents with her granddaughter for follow-up of prior dysphagia and gastric intestinal metaplasia and constipation.  Overall, she has been doing well.  She has not had any issues with dysphagia or odynophagia since her dilation in 12/24.  She still has hesitancy about repeat EGD for gastric mapping of her GIM, but she does worry about cancer so does think she will pursue this further in the near future, just not now.  She denies any abdominal pain or discomfort.  For her constipation, she continues to use MiraLAX  every three days to aid bowel movement passage if she hasn't had a BM.  She is not having significant bloating or pain.  She has not noted any blood in her stools.   GI Review of Systems Positive as above Negative for odynophagia, melena, hematochezia  Review of Systems General: Denies fevers/chills/weight loss unintentionally Cardiovascular: Denies chest  pain Pulmonary: Denies shortness of breath Gastroenterological: See HPI Genitourinary: Denies darkened urine Hematological: Denies easy bruising/bleeding Dermatological: Denies jaundice Psychological: Mood is stable   Medications Current Outpatient Medications  Medication Sig Dispense Refill   ascorbic acid (VITAMIN C) 500 MG tablet Take 500 mg by mouth daily.     azelastine  (ASTELIN ) 0.1 % nasal spray Place 2 sprays into both nostrils 2 (two) times daily. Use in each nostril as directed 30 mL 12   bisacodyl (DULCOLAX) 5 MG EC tablet Take 5 mg by mouth every other day.     cyanocobalamin  (VITAMIN B12) 1000 MCG tablet Take 1 tablet (1,000 mcg total) by mouth daily. 30 tablet 0   Dulaglutide  (TRULICITY ) 1.5 MG/0.5ML SOAJ Inject 1.5 mg into the skin once a week. 2 mL 5   ELIQUIS  2.5 MG TABS tablet TAKE 1 TABLET BY MOUTH TWICE A DAY 60 tablet 8   ferrous sulfate (SLOW IRON ) 160 (50 Fe) MG TBCR SR tablet Take 1 tablet (160 mg total) by mouth daily. 30 tablet 0   fluticasone  (FLONASE ) 50 MCG/ACT nasal spray Place 1 spray into both nostrils daily as needed for allergies or rhinitis. 1 spray in each nostril every day 16 g 12   furosemide  (LASIX ) 80 MG tablet Take 40 mg by mouth daily.     Melatonin 3 MG TBDP Take 6 mg by mouth at bedtime as needed (sleep).     metoprolol  succinate (TOPROL -XL) 25 MG 24 hr tablet TAKE 1/2 TABLET BY MOUTH DAILY 45 tablet 3   nitroGLYCERIN  (NITROSTAT ) 0.4 MG SL tablet Place 1 tablet (0.4 mg total) under the tongue every 5 (five) minutes as needed for chest pain. 30 tablet 2   rosuvastatin  (  CRESTOR ) 20 MG tablet TAKE 1 TABLET BY MOUTH EVERYDAY AT BEDTIME 90 tablet 1   sacubitril -valsartan  (ENTRESTO ) 24-26 MG TAKE 1 TABLET BY MOUTH TWICE A DAY 90 tablet 3   spironolactone  (ALDACTONE ) 25 MG tablet TAKE 1/2 TABLET BY MOUTH DAILY 45 tablet 3   traZODone  (DESYREL ) 50 MG tablet TAKE 1 TABLET BY MOUTH EVERY DAY AT BEDTIME AS NEEDED FOR SLEEP 90 tablet 1   omeprazole   (PRILOSEC) 40 MG capsule Take 1 capsule (40 mg total) by mouth daily. 180 capsule 2   No current facility-administered medications for this visit.    Allergies No Known Allergies  Histories Past Medical History:  Diagnosis Date   Acute pulmonary edema (HCC)    Acute respiratory failure with hypoxemia (HCC)    Acute respiratory failure with hypoxia (HCC)    Allergy    occ uses OTC allergy meds    Breast cancer (HCC) 04/2008   Right breast   Cataracts, bilateral    removed bilat    Cervical cancer (HCC)    When the patient was in her 30s   Depression    Empyema (HCC)    Esophageal perforation 05/05/2021   Failure to thrive in adult 07/22/2021   Wt Readings from Last 3 Encounters: 06/26/21 65.8 kg 06/17/21 64.8 kg 04/26/21 58.1 kg     Gastrostomy tube in place [facility]) 08/21/2021   Heart attack (HCC) 2004   History of cervical cancer    S/p radiation and surgical removal in 30's.  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 Melodye   PEG (percutaneous endoscopic gastrostomy) status (HCC) 07/23/2021   Personal history of radiation therapy 2009   Pressure injury of skin 05/05/2021   Protein-calorie malnutrition, severe 05/15/2021   Pyelonephritis 11/06/2021   S/P thoracotomy    Past Surgical History:  Procedure Laterality Date   BIOPSY  09/21/2023   Procedure: BIOPSY;  Surgeon: Wilhelmenia Aloha Raddle., MD;  Location: [facility];  Service: Gastroenterology;;   BREAST BIOPSY Left 08/05/2024   US  LT BREAST BX W LOC DEV 1ST LESION IMG BX SPEC US  GUIDE 08/05/2024 [facility]   BREAST LUMPECTOMY Right 2009   BREAST SURGERY  2009   right lumpectomy   CERVIX SURGERY  1980   CHOLECYSTECTOMY N/A 05/02/2021   Procedure: LAPAROSCOPIC  CHOLECYSTECTOMY;  Surgeon: Tanda Locus, MD;  Location: [facility];  Service: General;  Laterality: N/A;   COLONOSCOPY     ESOPHAGOGASTRODUODENOSCOPY (EGD) WITH PROPOFOL  N/A 09/21/2023   Procedure: ESOPHAGOGASTRODUODENOSCOPY (EGD) WITH PROPOFOL ;  Surgeon: Wilhelmenia Aloha Raddle., MD;  Location: [facility];  Service: Gastroenterology;  Laterality: N/A;   IR GASTROSTOMY TUBE MOD SED  06/03/2021   IR GASTROSTOMY TUBE REMOVAL  10/08/2021   LEFT HEART CATHETERIZATION WITH CORONARY ANGIOGRAM N/A 04/20/2014   Procedure: LEFT HEART CATHETERIZATION WITH CORONARY ANGIOGRAM;  Surgeon: Elsie GORMAN Somerset, MD;  Location: [facility] CATH LAB;  Service: Cardiovascular;  Laterality: N/A;   POLYPECTOMY     POLYPECTOMY  09/21/2023   Procedure: POLYPECTOMY;  Surgeon: Wilhelmenia Aloha Raddle., MD;  Location: [facility];  Service: Gastroenterology;;   HARLEY DILATION N/A 09/21/2023   Procedure: HARLEY HODGKIN;  Surgeon: Wilhelmenia Aloha Raddle., MD;  Location: [facility];  Service: Gastroenterology;  Laterality:  N/A;   STENT PLACEMENT VASCULAR ([facility] HX)     THORACOTOMY Right 05/05/2021   Procedure: THORACOTOMY MAJOR REPAIR PERFORATED ESOPHAGUS;  Surgeon: Lucas Dorise POUR, MD;  Location: [facility] OR;  Service: Thoracic;  Laterality: Right;   thyroid      for nodule hemithyroidectomy, benign   THYROIDECTOMY Right 03/2003   Social History   Socioeconomic History   Marital status: Divorced    Spouse name: Not on file   Number of children: 5   Years of education: 10   Highest education level: 10th grade  Occupational History   Occupation: Retired- Metallurgist  Tobacco Use   Smoking status: Never   Smokeless tobacco: Never   Tobacco comments:    No plans to start  Vaping Use   Vaping status: Never Used  Substance and Sexual Activity   Alcohol use: No   Drug use: No   Sexual activity: Not Currently    Birth control/protection: Condom  Other Topics Concern   Not on file  Social History Narrative   Patient  lives alone in [place].   Patient is close with daughters Bobbette and Myra. One Son in [place] . 2 children have passed.    Patient depends on her sister and brother for support and any assistance needed.    Orlean (sister) [phone].   Religious / Personal Beliefs: Baptist   Patient does not drive.   Diet: Pt has a varied diet of protein, starch, and vegetables and fruits.   Seatbelts: Pt reports wearing seatbelt when in vehicles.    Hobbies: shopping, visiting family, church   Education / Work:  [employer]                                                              Social Drivers of Corporate Investment Banker Strain: Low Risk  (04/07/2024)   Overall Financial Resource Strain (CARDIA)    Difficulty of Paying Living Expenses: Not hard at all  Food Insecurity: No Food Insecurity (04/07/2024)   Hunger Vital Sign    Worried About Running Out of Food in the Last Year: Never true    Ran Out of Food in the Last Year: Never true  Transportation Needs: No Transportation Needs (04/07/2024)   PRAPARE - Administrator, Civil Service (Medical): No    Lack of Transportation (Non-Medical): No  Physical Activity: Insufficiently Active (04/07/2024)   Exercise Vital Sign    Days of Exercise per Week: 3 days    Minutes of Exercise per Session: 30 min  Stress: No Stress Concern Present (04/07/2024)   [employer] of Occupational Health - Occupational Stress Questionnaire    Feeling of Stress: Not at all  Social Connections: Moderately Isolated (04/07/2024)   Social Connection and Isolation Panel    Frequency of Communication with Friends and Family: More than three times a week    Frequency of Social Gatherings with Friends and Family: Three times a week    Attends Religious Services: More than 4 times per year    Active Member of Clubs or Organizations: No    Attends Banker Meetings: Never    Marital Status: Divorced  Catering Manager  Violence: Not At Risk (04/07/2024)   Humiliation, Afraid, Rape, and Kick questionnaire  Fear of Current or Ex-Partner: No    Emotionally Abused: No    Physically Abused: No    Sexually Abused: No   Family History  Problem Relation Age of Onset   Heart disease Mother        age 50   Heart disease Father    Cancer Father        unknown   Breast cancer Sister    Diabetes Daughter    Stroke Daughter    Kidney disease Daughter    Heart disease Brother    Hypertension Brother    Cirrhosis Brother    Hypertension Son    Colon cancer Neg Hx    Colon polyps Neg Hx    Esophageal cancer Neg Hx    Stomach cancer Neg Hx    Rectal cancer Neg Hx    Inflammatory bowel disease Neg Hx    Liver disease Neg Hx    Pancreatic cancer Neg Hx    I have reviewed her medical, social, and family history in detail and updated the electronic medical record as necessary.    PHYSICAL EXAMINATION  BP 136/74   Pulse 64   Ht 5' 2 (1.575 m)   Wt 116 lb 6 oz (52.8 kg)   BMI 21.29 kg/m  Wt Readings from Last 3 Encounters:  08/23/24 116 lb 6 oz (52.8 kg)  07/27/24 117 lb 6 oz (53.2 kg)  07/20/24 115 lb 3.2 oz (52.3 kg)  GEN: NAD, appears stated age, doesn't appear chronically ill, granddaughter with her PSYCH: Cooperative, without pressured speech EYE: Conjunctivae pink, sclerae anicteric ENT: MMM CV: Nontachycardic RESP: No audible wheezing GI: NABS, soft, NT/ND, without rebound MSK/EXT: No significant lower extremity edema SKIN: No jaundice NEURO:  Alert & Oriented x 3, no focal deficits   REVIEW OF DATA  I reviewed the following data at the time of this encounter:  GI Procedures and Studies  No new procedures to review  Laboratory Studies  Reviewed those in epic  Imaging Studies  No new imaging studies to review   ASSESSMENT/PLAN  Ms. Hult is a 83 y.o. female.  The patient is seen today for evaluation and management of:  1. Gastric intestinal metaplasia   2. Chronic  idiopathic constipation   3. Esophageal dysphagia    The patient is clinically and hemodynamically stable at this time.   History of Gastric intestinal metaplasia Identified on previous endoscopy.  Discussed role of gastric mapping previously and once again today so that we can understand longer term risk.  She prefers to defer further endoscopy until 2026.  She has no other symptoms, so this is reasonable. - Scheduled recall for endoscopy in February/March 2026 for surveillance mapping of gastric intestinal metaplasia.   History of Dysphagia Doing well overall. - If swallowing symptoms worsen, contact us  and repeat EGD with dilation will be scheduled.  Constipation Stable and doing well on current PRN medication needs. - Continue MiraLAX  as needed. - Continue dietary fiber. - Consider fiber supplementation (Benefiber/Metamucil/Fibercon).    No orders of the defined types were placed in this encounter.   New Prescriptions   No medications on file   Modified Medications   Modified Medication Previous Medication   OMEPRAZOLE  (PRILOSEC) 40 MG CAPSULE omeprazole  (PRILOSEC) 40 MG capsule      Take 1 capsule (40 mg total) by mouth daily.    TAKE 1 CAPSULE BY MOUTH 2 TIMES DAILY BEFORE A MEAL.    Planned Follow Up No follow-ups on  file.   Total Time in Face-to-Face and in Coordination of Care for patient including independent/personal interpretation/review of prior testing, medical history, examination, medication adjustment, communicating results with the patient directly, and documentation within the EHR is 30 minutes.   Aloha Finner, MD [facility] Office # [phone]

## 2024-08-25 ENCOUNTER — Other Ambulatory Visit: Payer: Self-pay

## 2024-09-06 ENCOUNTER — Other Ambulatory Visit (HOSPITAL_COMMUNITY): Payer: Self-pay

## 2024-09-12 ENCOUNTER — Encounter: Payer: Self-pay | Admitting: Pharmacist

## 2024-09-12 NOTE — Progress Notes (Signed)
 This patient is appearing on a report for being at risk of failing the adherence measure for diabetes medications this calendar year.   Medication: Trulicity  (dulaglutide )  Last fill date: 09/06/24 for 28  day supply - PASS for 2025  Reviewed medication indication, dosing, and goals of therapy.

## 2024-09-16 ENCOUNTER — Other Ambulatory Visit: Payer: Self-pay | Admitting: Gastroenterology

## 2024-10-07 ENCOUNTER — Other Ambulatory Visit (HOSPITAL_COMMUNITY): Payer: Self-pay

## 2024-10-10 ENCOUNTER — Other Ambulatory Visit (HOSPITAL_COMMUNITY): Payer: Self-pay

## 2024-10-12 ENCOUNTER — Other Ambulatory Visit: Payer: Self-pay

## 2024-10-17 ENCOUNTER — Other Ambulatory Visit: Payer: Self-pay | Admitting: Family Medicine

## 2024-11-11 ENCOUNTER — Other Ambulatory Visit (HOSPITAL_COMMUNITY): Payer: Self-pay

## 2024-11-14 ENCOUNTER — Other Ambulatory Visit (HOSPITAL_COMMUNITY): Payer: Self-pay | Admitting: Family Medicine

## 2024-11-14 ENCOUNTER — Other Ambulatory Visit: Payer: Self-pay | Admitting: Student

## 2024-11-14 DIAGNOSIS — E1169 Type 2 diabetes mellitus with other specified complication: Secondary | ICD-10-CM

## 2024-11-18 ENCOUNTER — Other Ambulatory Visit: Payer: Self-pay | Admitting: Student

## 2024-11-18 DIAGNOSIS — E785 Hyperlipidemia, unspecified: Secondary | ICD-10-CM

## 2025-02-10 ENCOUNTER — Ambulatory Visit (HOSPITAL_COMMUNITY): Admitting: Internal Medicine

## 2025-04-13 ENCOUNTER — Encounter
